# Patient Record
Sex: Male | Born: 1970 | Race: White | Hispanic: No | Marital: Married | Smoking: Never smoker
Health system: Southern US, Community
[De-identification: ages and names within clinical notes are randomized; demographics above are authoritative.]

## PROBLEM LIST (undated history)

## (undated) DIAGNOSIS — Z87442 Personal history of urinary calculi: Secondary | ICD-10-CM

## (undated) DIAGNOSIS — E785 Hyperlipidemia, unspecified: Secondary | ICD-10-CM

## (undated) DIAGNOSIS — T8859XA Other complications of anesthesia, initial encounter: Secondary | ICD-10-CM

## (undated) DIAGNOSIS — N289 Disorder of kidney and ureter, unspecified: Secondary | ICD-10-CM

## (undated) DIAGNOSIS — T7840XA Allergy, unspecified, initial encounter: Secondary | ICD-10-CM

## (undated) DIAGNOSIS — K219 Gastro-esophageal reflux disease without esophagitis: Secondary | ICD-10-CM

## (undated) DIAGNOSIS — E119 Type 2 diabetes mellitus without complications: Secondary | ICD-10-CM

## (undated) DIAGNOSIS — R519 Headache, unspecified: Secondary | ICD-10-CM

## (undated) HISTORY — DX: Hyperlipidemia, unspecified: E78.5

## (undated) HISTORY — PX: OTHER SURGICAL HISTORY: SHX169

## (undated) HISTORY — DX: Type 2 diabetes mellitus without complications: E11.9

## (undated) HISTORY — DX: Personal history of urinary calculi: Z87.442

## (undated) HISTORY — PX: URETHRAL STRICTURE DILATATION: SHX477

## (undated) HISTORY — DX: Allergy, unspecified, initial encounter: T78.40XA

## (undated) HISTORY — PX: WISDOM TOOTH EXTRACTION: SHX21

---
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
## 1994-09-03 HISTORY — PX: COLONOSCOPY: SHX174

## 2012-05-04 ENCOUNTER — Emergency Department: Payer: Self-pay | Admitting: Emergency Medicine

## 2012-05-04 LAB — COMPREHENSIVE METABOLIC PANEL
Albumin: 4.5 g/dL (ref 3.4–5.0)
Alkaline Phosphatase: 129 U/L (ref 50–136)
BUN: 12 mg/dL (ref 7–18)
Bilirubin,Total: 0.4 mg/dL (ref 0.2–1.0)
Calcium, Total: 9.2 mg/dL (ref 8.5–10.1)
Chloride: 99 mmol/L (ref 98–107)
Co2: 29 mmol/L (ref 21–32)
Creatinine: 1.06 mg/dL (ref 0.60–1.30)
EGFR (African American): >60
Osmolality: 274 (ref 275–301)
Potassium: 3.5 mmol/L (ref 3.5–5.1)
Total Protein: 8.5 g/dL — ABNORMAL HIGH (ref 6.4–8.2)

## 2012-05-04 LAB — CBC
MCH: 29.1 pg (ref 26.0–34.0)
MCHC: 33.8 g/dL (ref 32.0–36.0)
MCV: 86 fL (ref 80–100)
Platelet: 261 10*3/uL (ref 150–440)
RBC: 5.63 10*6/uL (ref 4.40–5.90)
RDW: 12.7 % (ref 11.5–14.5)

## 2012-05-04 LAB — URINALYSIS, COMPLETE
Bilirubin,UR: NEGATIVE
Glucose,UR: NEGATIVE mg/dL (ref 0–75)
Ketone: NEGATIVE
Leukocyte Esterase: NEGATIVE
Nitrite: NEGATIVE
Protein: 30
RBC,UR: 42 /HPF (ref 0–5)
Specific Gravity: 1.026 (ref 1.003–1.030)
WBC UR: 2 /HPF (ref 0–5)

## 2012-05-04 LAB — LIPASE, BLOOD: Lipase: 225 U/L (ref 73–393)

## 2012-05-04 IMAGING — CT CT STONE STUDY
1 of 2 series · 15 of 32 positions shown, 19 images · non-contrast
Comparison: None

REASON FOR EXAM: right flank pain
COMMENTS:

PROCEDURE:     CT  - CT ABDOMEN /PELVIS WO (STONE)  - [DATE] [DATE]
RESULT:     Indication: Flank Pain
TECHNIQUE: Multiple axial images from the lung bases to the symphysis pubis
were obtained without oral and without intravenous contrast.

[Series 2: 3mm soft tissue · axial · 0.70mm/px · z∈[-532,-64]mm · 15 of 170 slices shown, 19 images]
[im 7/170  soft-tissue]
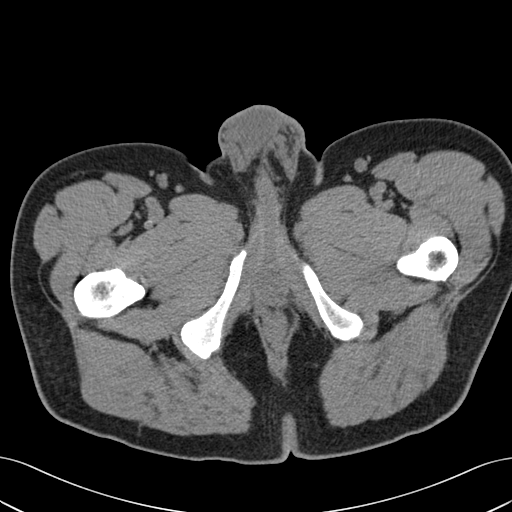
[im 7/170  bone]
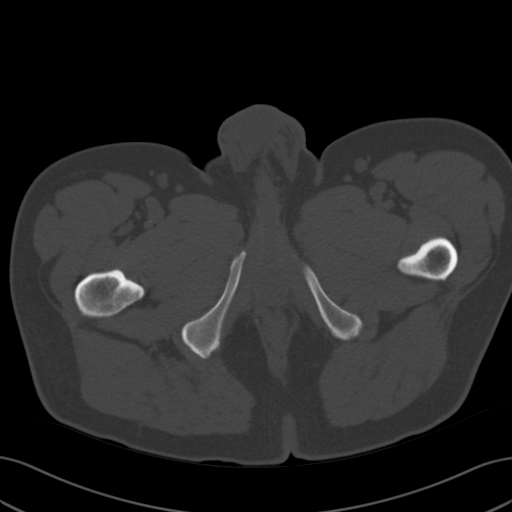
[im 21/170  soft-tissue]
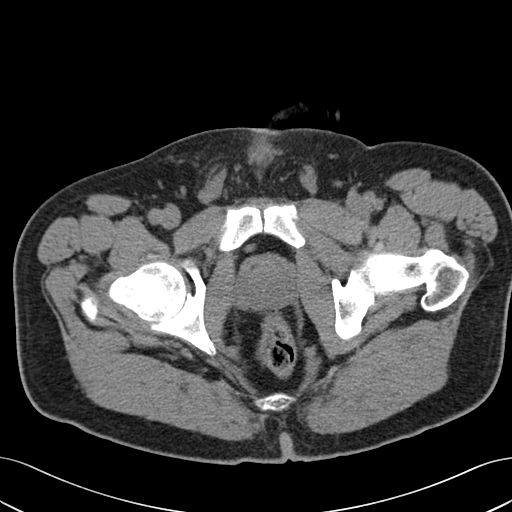
[im 34/170  soft-tissue]
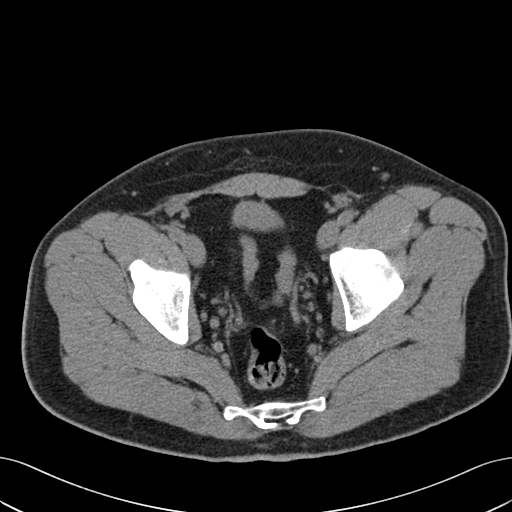
[im 48/170  soft-tissue]
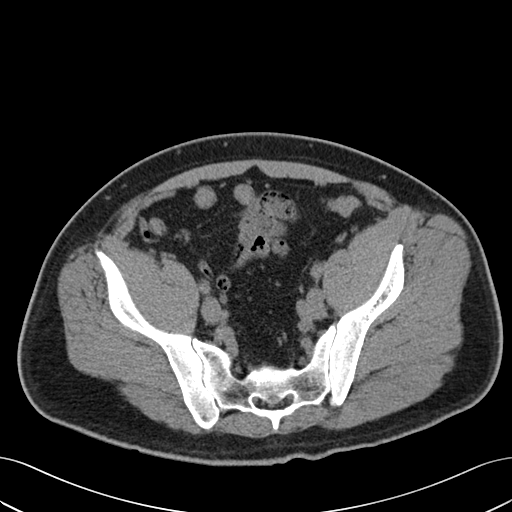
[im 61/170  soft-tissue]
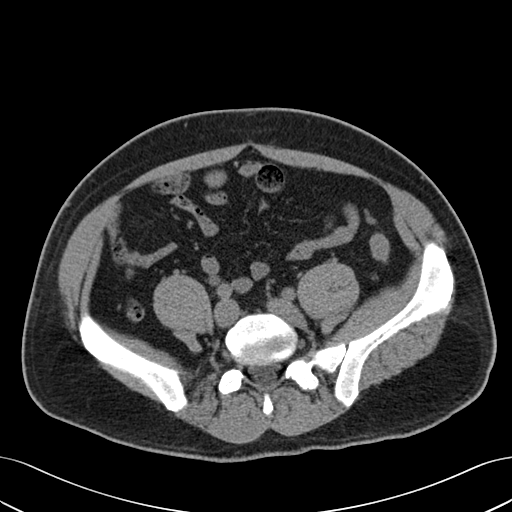
[im 75/170  soft-tissue]
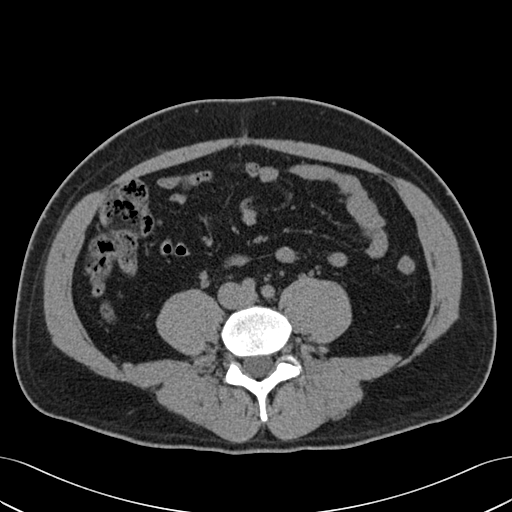
[im 88/170  soft-tissue]
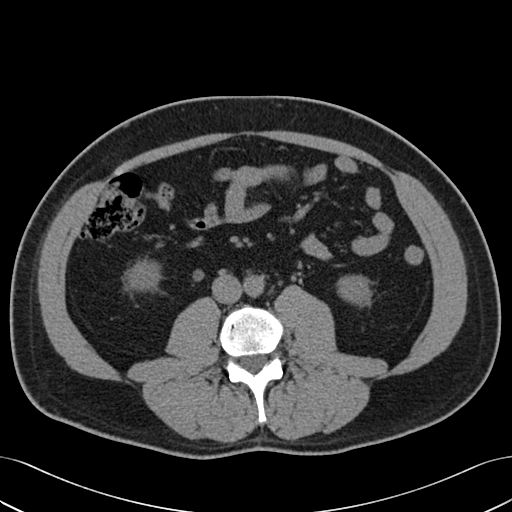
[im 95/170  soft-tissue]
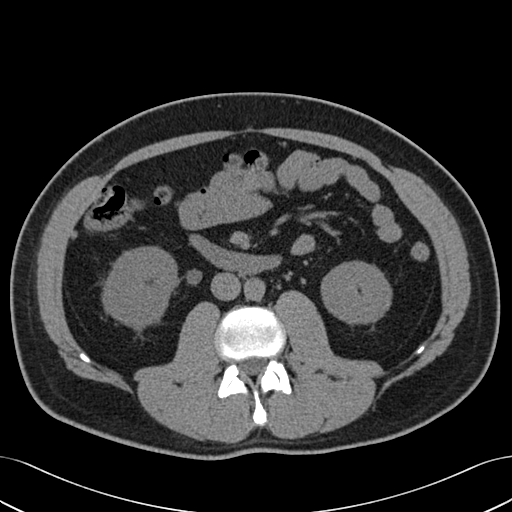
[im 109/170  soft-tissue]
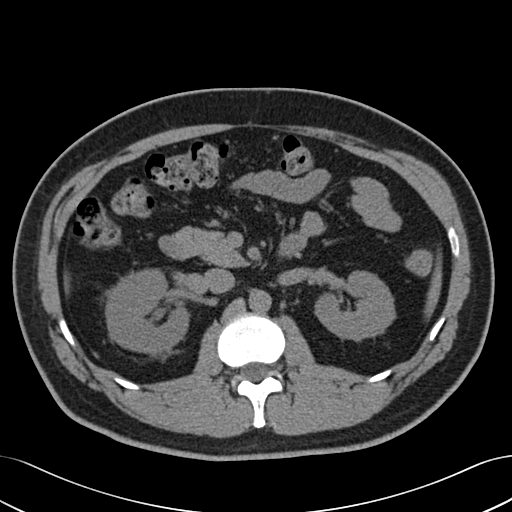
[im 109/170  bone]
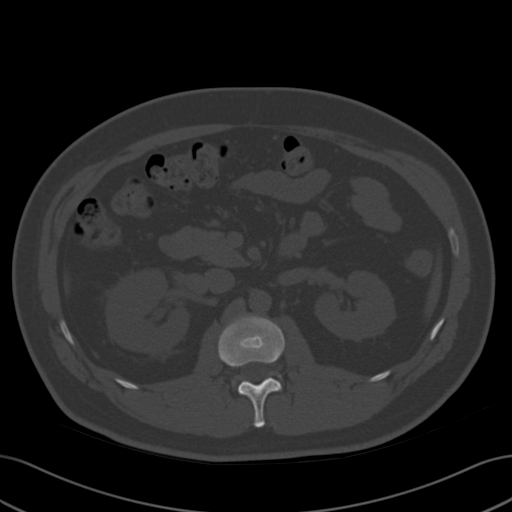
[im 122/170  soft-tissue]
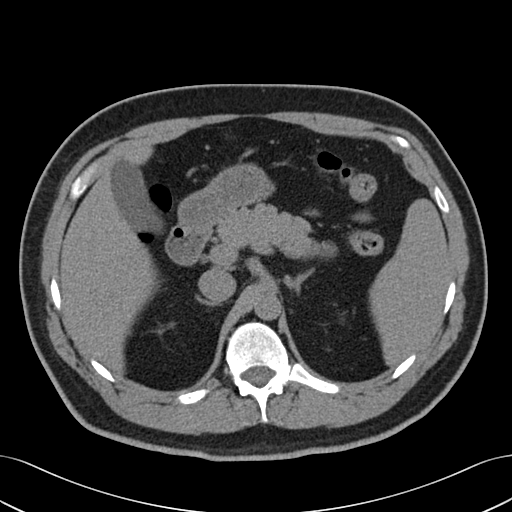
[im 136/170  soft-tissue]
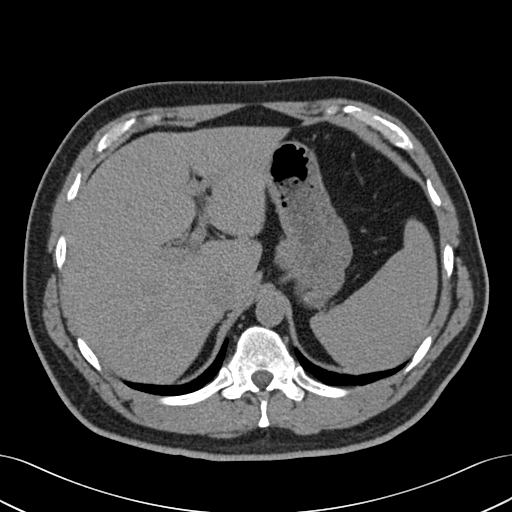
[im 142/170  lung]
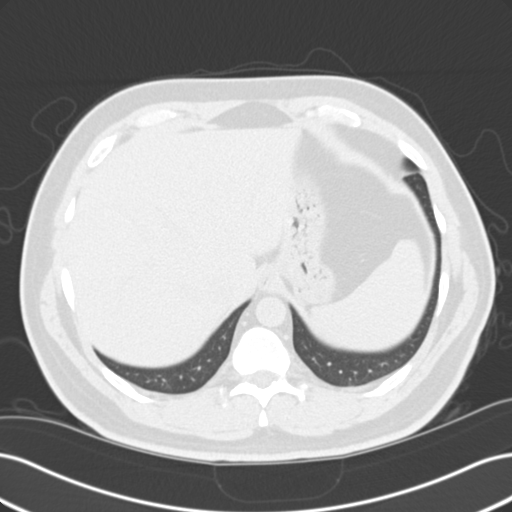
[im 149/170  soft-tissue]
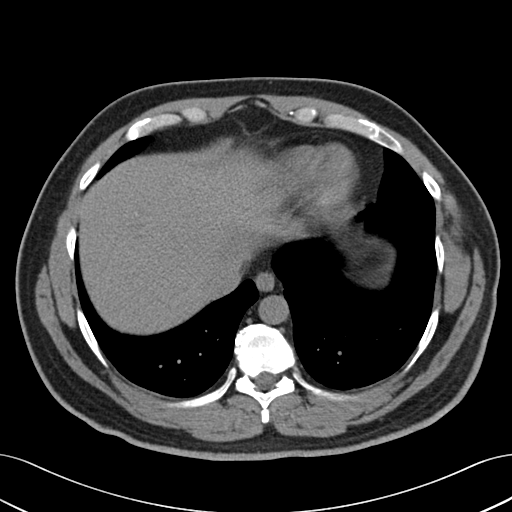
[im 149/170  lung]
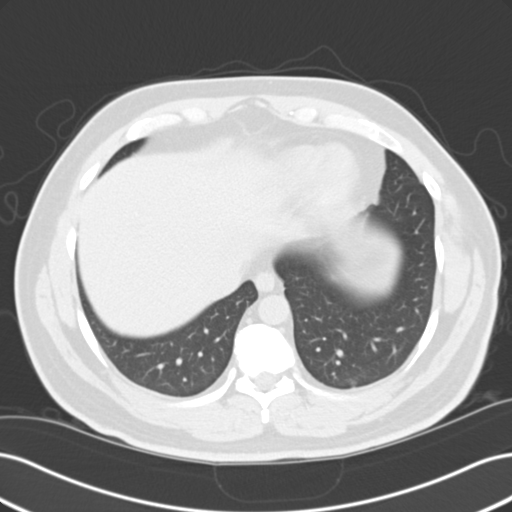
[im 156/170  lung]
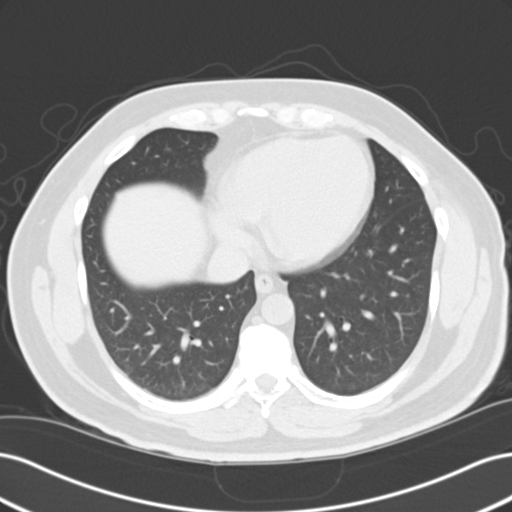
[im 163/170  soft-tissue]
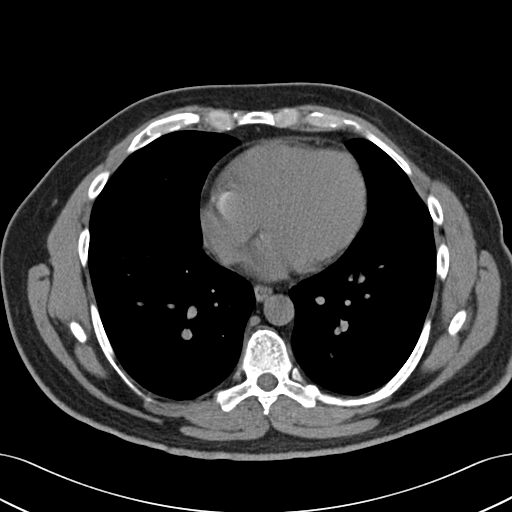
[im 163/170  lung]
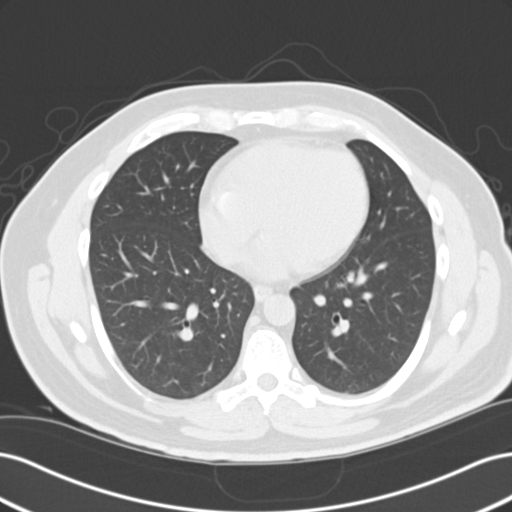

[15 of 32 positions shown; findings below may reference images not displayed]

FINDINGS: The lung bases are clear. There is no pleural or pericardial effusions.

There is a 3 mm distal right ureteral calculus resulting in mild right
hydroureteronephrosis and mild right perinephric stranding. There is no
other renal, ureteral or bladder calculus. The kidneys are symmetric in size
without evidence for exophytic mass. The bladder is unremarkable.

The liver demonstrates no focal abnormality. The gallbladder is
unremarkable. The spleen demonstrates no focal abnormality. The adrenal
glands and pancreas are normal.

The unopacified stomach, duodenum, small intestine, and large intestine are
unremarkable, but evaluation is limited by lack of oral contrast. There is a
normal caliber appendix in the right lower quadrant without periappendiceal
inflammatory changes. There is no pneumoperitoneum, pneumatosis, or portal
venous gas. There is no abdominal or pelvic free fluid. There is no
lymphadenopathy.

The abdominal aorta is normal in caliber.

The osseous structures are unremarkable.
IMPRESSION: 1. There is a 3 mm distal right ureteral calculus resulting in mild right
hydroureteronephrosis and mild right perinephric stranding.

[REDACTED]

## 2012-06-23 HISTORY — PX: SKIN CANCER EXCISION: SHX779

## 2013-06-08 HISTORY — PX: OTHER SURGICAL HISTORY: SHX169

## 2014-06-06 ENCOUNTER — Ambulatory Visit (INDEPENDENT_AMBULATORY_CARE_PROVIDER_SITE_OTHER): Payer: BC Managed Care – PPO | Admitting: Internal Medicine

## 2014-06-06 ENCOUNTER — Encounter: Payer: Self-pay | Admitting: Internal Medicine

## 2014-06-06 VITALS — BP 128/86 | HR 67 | Temp 98.1°F | Ht 68.0 in | Wt 176.0 lb

## 2014-06-06 DIAGNOSIS — R631 Polydipsia: Secondary | ICD-10-CM

## 2014-06-06 DIAGNOSIS — R202 Paresthesia of skin: Secondary | ICD-10-CM

## 2014-06-06 DIAGNOSIS — R739 Hyperglycemia, unspecified: Secondary | ICD-10-CM

## 2014-06-06 DIAGNOSIS — R3589 Other polyuria: Secondary | ICD-10-CM

## 2014-06-06 DIAGNOSIS — R358 Other polyuria: Secondary | ICD-10-CM

## 2014-06-06 DIAGNOSIS — E785 Hyperlipidemia, unspecified: Secondary | ICD-10-CM

## 2014-06-06 LAB — CBC
HCT: 48 % (ref 39.0–52.0)
HEMOGLOBIN: 16.3 g/dL (ref 13.0–17.0)
MCHC: 34 g/dL (ref 30.0–36.0)
MCV: 85.6 fl (ref 78.0–100.0)
Platelets: 264 10*3/uL (ref 150.0–400.0)
RBC: 5.61 Mil/uL (ref 4.22–5.81)
RDW: 12.3 % (ref 11.5–15.5)
WBC: 7.3 10*3/uL (ref 4.0–10.5)

## 2014-06-06 LAB — COMPREHENSIVE METABOLIC PANEL
ALBUMIN: 4.6 g/dL (ref 3.5–5.2)
ALT: 69 U/L — ABNORMAL HIGH (ref 0–53)
AST: 33 U/L (ref 0–37)
Alkaline Phosphatase: 109 U/L (ref 39–117)
BUN: 13 mg/dL (ref 6–23)
CALCIUM: 9.5 mg/dL (ref 8.4–10.5)
CO2: 27 mEq/L (ref 19–32)
CREATININE: 0.9 mg/dL (ref 0.4–1.5)
Chloride: 98 mEq/L (ref 96–112)
GFR: 104.98 mL/min (ref >60.00)
GLUCOSE: 195 mg/dL — AB (ref 70–99)
POTASSIUM: 4.1 meq/L (ref 3.5–5.1)
Sodium: 132 mEq/L — ABNORMAL LOW (ref 135–145)
Total Bilirubin: 0.7 mg/dL (ref 0.2–1.2)
Total Protein: 7.4 g/dL (ref 6.0–8.3)

## 2014-06-06 LAB — LIPID PANEL
CHOLESTEROL: 236 mg/dL — AB (ref 0–200)
HDL: 25.3 mg/dL — AB (ref >39.00)
NonHDL: 210.7
TRIGLYCERIDES: 218 mg/dL — AB (ref 0.0–149.0)
Total CHOL/HDL Ratio: 9
VLDL: 43.6 mg/dL — ABNORMAL HIGH (ref 0.0–40.0)

## 2014-06-06 LAB — HEMOGLOBIN A1C: Hgb A1c MFr Bld: 10.7 % — ABNORMAL HIGH (ref 4.6–6.5)

## 2014-06-06 NOTE — Patient Instructions (Signed)

## 2014-06-06 NOTE — Progress Notes (Signed)
Pre visit review using our clinic review tool, if applicable. No additional management support is needed unless otherwise documented below in the visit note. 

## 2014-06-06 NOTE — Progress Notes (Signed)
Subjective:    Patient ID: Alan Wise, male    DOB: 1970/10/25, 43 y.o.   MRN: [id number]  HPI  Pt presents to the clinic today to follow up hyperglycemia. He reports that he has been seeing urology and was told that he has high amounts of glucose in his urine. He has been checking his blood sugars at home. He reports that they have ranged 160-170 fasting, but has been as high as 300. He has had some increased thirst, increased urination and numbness and tingling in his hands or feet. He has never been told that he has diabetes. He does have a family history of diabetes.  Review of Systems      Past Medical History  Diagnosis Date  . History of kidney stones   . Allergy     No current outpatient prescriptions on file.   No current facility-administered medications for this visit.    No Known Allergies  Family History  Problem Relation Age of Onset  . Hyperlipidemia Father   . Hypertension Father   . Diabetes Father   . Stroke Maternal Grandmother   . Diabetes Maternal Grandmother   . Stroke Paternal Grandmother   . Hypertension Paternal Grandmother     History   Social History  . Marital Status: Single    Spouse Name: N/A    Number of Children: N/A  . Years of Education: N/A   Occupational History  . Not on file.   Social History Main Topics  . Smoking status: Never Smoker   . Smokeless tobacco: Never Used  . Alcohol Use: 0.0 oz/week    0 Not specified per week     Comment: occasional  . Drug Use: Not on file  . Sexual Activity: Not on file   Other Topics Concern  . Not on file   Social History Narrative  . No narrative on file     Constitutional: Denies fever, malaise, fatigue, headache or abrupt weight changes.  HEENT: Denies eye pain, eye redness, ear pain, ringing in the ears, wax buildup, runny nose, nasal congestion, bloody nose, or sore throat. Respiratory: Denies difficulty breathing, shortness of breath, cough or sputum production.    Cardiovascular: Denies chest pain, chest tightness, palpitations or swelling in the hands or feet.  Gastrointestinal: Pt reports increased thirst. Denies abdominal pain, bloating, constipation, diarrhea or blood in the stool.  GU: Pt reports frequency. Denies urgency, pain with urination, burning sensation, blood in urine, odor or discharge. Musculoskeletal: Denies decrease in range of motion, difficulty with gait, muscle pain or joint pain and swelling.  Skin: Denies redness, rashes, lesions or ulcercations.  Neurological: Pt reports numbness and tingling in his feet. Denies dizziness, difficulty with memory, difficulty with speech or problems with balance and coordination.   No other specific complaints in a complete review of systems (except as listed in HPI above).   Objective:   Physical Exam  Ht 5\' 8"  (1.727 m)  Wt 176 lb (79.833 kg)  BMI 26.77 kg/m2 Wt Readings from Last 3 Encounters:  06/06/14 176 lb (79.833 kg)    General: Appears his stated age, well developed, well nourished in NAD. Skin: Warm, dry and intact. No rashes, lesions or ulcerations noted. Cardiovascular: Normal rate and rhythm. S1,S2 noted.  No murmur, rubs or gallops noted. No JVD or BLE edema. No carotid bruits noted. Pulmonary/Chest: Normal effort and positive vesicular breath sounds. No respiratory distress. No wheezes, rales or ronchi noted.  Neurological: Alert and oriented.  Sensation intact to bilateral feet. Coordination normal. +DTRs bilaterally.       Assessment & Plan:   Fasting hyperglycemia, polydipsia, polyuria, paresthesia:  Will check A1C, CBC, CMET and lipid profile today If A1C> 6.5%, he will be diagnosed as diabetic Discussed lifestyle modification versus medication therapy  Will follow up with you after labs are back

## 2014-06-07 LAB — LDL CHOLESTEROL, DIRECT: Direct LDL: 177.2 mg/dL

## 2014-06-12 ENCOUNTER — Ambulatory Visit (INDEPENDENT_AMBULATORY_CARE_PROVIDER_SITE_OTHER): Payer: BC Managed Care – PPO | Admitting: Internal Medicine

## 2014-06-12 ENCOUNTER — Encounter: Payer: Self-pay | Admitting: Internal Medicine

## 2014-06-12 VITALS — BP 118/82 | HR 78 | Temp 98.2°F | Wt 178.0 lb

## 2014-06-12 DIAGNOSIS — E1165 Type 2 diabetes mellitus with hyperglycemia: Secondary | ICD-10-CM | POA: Insufficient documentation

## 2014-06-12 DIAGNOSIS — E785 Hyperlipidemia, unspecified: Secondary | ICD-10-CM | POA: Insufficient documentation

## 2014-06-12 DIAGNOSIS — E1169 Type 2 diabetes mellitus with other specified complication: Secondary | ICD-10-CM | POA: Insufficient documentation

## 2014-06-12 MED ORDER — METFORMIN HCL 1000 MG PO TABS: ORAL_TABLET | ORAL | Status: DC

## 2014-06-12 NOTE — Progress Notes (Signed)
Pre visit review using our clinic review tool, if applicable. No additional management support is needed unless otherwise documented below in the visit note. 

## 2014-06-12 NOTE — Assessment & Plan Note (Signed)
Discussed standards of medical care with diabetes Discussed diabetes and diet Will start Metformin Foot exam today He will make an appt with an eye doctor He declines flu and pneumonia shot today He declines diabetes education classes  He will return in 3 months for follow up with A1C and microalbumin 1 week prior

## 2014-06-12 NOTE — Patient Instructions (Signed)
Diabetes and Standards of Medical Care Diabetes is complicated. You may find that your diabetes team includes a dietitian, nurse, diabetes educator, eye doctor, and more. To help everyone know what is going on and to help you get the care you deserve, the following schedule of care was developed to help keep you on track. Below are the tests, exams, vaccines, medicines, education, and plans you will need. HbA1c test This test shows how well you have controlled your glucose over the past 2-3 months. It is used to see if your diabetes management plan needs to be adjusted.   It is performed at least 2 times a year if you are meeting treatment goals.  It is performed 4 times a year if therapy has changed or if you are not meeting treatment goals. Blood pressure test  This test is performed at every routine medical visit. The goal is less than 140/90 mm Hg for most people, but 130/80 mm Hg in some cases. Ask your health care provider about your goal. Dental exam  Follow up with the dentist regularly. Eye exam  If you are diagnosed with type 1 diabetes as a child, get an exam upon reaching the age of 37 years or older and have had diabetes for 3-5 years. Yearly eye exams are recommended after that initial eye exam.  If you are diagnosed with type 1 diabetes as an adult, get an exam within 5 years of diagnosis and then yearly.  If you are diagnosed with type 2 diabetes, get an exam as soon as possible after the diagnosis and then yearly. Foot care exam  Visual foot exams are performed at every routine medical visit. The exams check for cuts, injuries, or other problems with the feet.  A comprehensive foot exam should be done yearly. This includes visual inspection as well as assessing foot pulses and testing for loss of sensation.  Check your feet nightly for cuts, injuries, or other problems with your feet. Tell your health care provider if anything is not healing. Kidney function test (urine  microalbumin)  This test is performed once a year.  Type 1 diabetes: The first test is performed 5 years after diagnosis.  Type 2 diabetes: The first test is performed at the time of diagnosis.  A serum creatinine and estimated glomerular filtration rate (eGFR) test is done once a year to assess the level of chronic kidney disease (CKD), if present. Lipid profile (cholesterol, HDL, LDL, triglycerides)  Performed every 5 years for most people.  The goal for LDL is less than 100 mg/dL. If you are at high risk, the goal is less than 70 mg/dL.  The goal for HDL is 40 mg/dL-50 mg/dL for men and 50 mg/dL-60 mg/dL for women. An HDL cholesterol of 60 mg/dL or higher gives some protection against heart disease.  The goal for triglycerides is less than 150 mg/dL. Influenza vaccine, pneumococcal vaccine, and hepatitis B vaccine  The influenza vaccine is recommended yearly.  It is recommended that people with diabetes who are over 24 years old get the pneumonia vaccine. In some cases, two separate shots may be given. Ask your health care provider if your pneumonia vaccination is up to date.  The hepatitis B vaccine is also recommended for adults with diabetes. Diabetes self-management education  Education is recommended at diagnosis and ongoing as needed. Treatment plan  Your treatment plan is reviewed at every medical visit. Document Released: 04/06/2009 Document Revised: 10/24/2013 Document Reviewed: 11/09/2012 [facility] Patient Information 2015 [place],  [employer]. This information is not intended to replace advice given to you by your health care provider. Make sure you discuss any questions you have with your health care provider. Diabetes Mellitus and Food It is important for you to manage your blood sugar (glucose) level. Your blood glucose level can be greatly affected by what you eat. Eating healthier foods in the appropriate amounts throughout the day at about the same time each day will  help you control your blood glucose level. It can also help slow or prevent worsening of your diabetes mellitus. Healthy eating may even help you improve the level of your blood pressure and reach or maintain a healthy weight.  HOW CAN FOOD AFFECT ME? Carbohydrates Carbohydrates affect your blood glucose level more than any other type of food. Your dietitian will help you determine how many carbohydrates to eat at each meal and teach you how to count carbohydrates. Counting carbohydrates is important to keep your blood glucose at a healthy level, especially if you are using insulin or taking certain medicines for diabetes mellitus. Alcohol Alcohol can cause sudden decreases in blood glucose (hypoglycemia), especially if you use insulin or take certain medicines for diabetes mellitus. Hypoglycemia can be a life-threatening condition. Symptoms of hypoglycemia (sleepiness, dizziness, and disorientation) are similar to symptoms of having too much alcohol.  If your health care provider has given you approval to drink alcohol, do so in moderation and use the following guidelines:  Women should not have more than one drink per day, and men should not have more than two drinks per day. One drink is equal to:  12 oz of beer.  5 oz of wine.  1 oz of hard liquor.  Do not drink on an empty stomach.  Keep yourself hydrated. Have water, diet soda, or unsweetened iced tea.  Regular soda, juice, and other mixers might contain a lot of carbohydrates and should be counted. WHAT FOODS ARE NOT RECOMMENDED? As you make food choices, it is important to remember that all foods are not the same. Some foods have fewer nutrients per serving than other foods, even though they might have the same number of calories or carbohydrates. It is difficult to get your body what it needs when you eat foods with fewer nutrients. Examples of foods that you should avoid that are high in calories and carbohydrates but low in  nutrients include:  Trans fats (most processed foods list trans fats on the Nutrition Facts label).  Regular soda.  Juice.  Candy.  Sweets, such as cake, pie, doughnuts, and cookies.  Fried foods. WHAT FOODS CAN I EAT? Have nutrient-rich foods, which will nourish your body and keep you healthy. The food you should eat also will depend on several factors, including:  The calories you need.  The medicines you take.  Your weight.  Your blood glucose level.  Your blood pressure level.  Your cholesterol level. You also should eat a variety of foods, including:  Protein, such as meat, poultry, fish, tofu, nuts, and seeds (lean animal proteins are best).  Fruits.  Vegetables.  Dairy products, such as milk, cheese, and yogurt (low fat is best).  Breads, grains, pasta, cereal, rice, and beans.  Fats such as olive oil, trans fat-free margarine, canola oil, avocado, and olives. DOES EVERYONE WITH DIABETES MELLITUS HAVE THE SAME MEAL PLAN? Because every person with diabetes mellitus is different, there is not one meal plan that works for everyone. It is very important that you meet with a  dietitian who will help you create a meal plan that is just right for you. Document Released: 03/06/2005 Document Revised: 06/14/2013 Document Reviewed: 05/06/2013 [facility] Patient Information 2015 [place], [place]. This information is not intended to replace advice given to you by your health care provider. Make sure you discuss any questions you have with your health care provider.

## 2014-06-12 NOTE — Progress Notes (Signed)
Subjective:    Patient ID: Alan Wise, male    DOB: [birth year], 43 y.o.   MRN: [id number]  HPI  Pt presents to the clinic today to follow up labs. His A1C was 10.1%. His cholesterol is also elevated. Total 236, HDL 25, LDL 177 and Triglycerides 218.  Review of Systems      Past Medical History  Diagnosis Date  . History of kidney stones   . Allergy     No current outpatient prescriptions on file.   No current facility-administered medications for this visit.    No Known Allergies  Family History  Problem Relation Age of Onset  . Hyperlipidemia Father   . Hypertension Father   . Diabetes Father   . Stroke Maternal Grandmother   . Diabetes Maternal Grandmother   . Stroke Paternal Grandmother   . Hypertension Paternal Grandmother     History   Social History  . Marital Status: Single    Spouse Name: N/A    Number of Children: N/A  . Years of Education: N/A   Occupational History  . Not on file.   Social History Main Topics  . Smoking status: Never Smoker   . Smokeless tobacco: Never Used  . Alcohol Use: 0.0 oz/week    0 Not specified per week     Comment: occasional  . Drug Use: Not on file  . Sexual Activity: Not on file   Other Topics Concern  . Not on file   Social History Narrative     Constitutional: Denies fever, malaise, fatigue, headache or abrupt weight changes.  HEENT: Denies eye pain, eye redness, ear pain, ringing in the ears, wax buildup, runny nose, nasal congestion, bloody nose, or sore throat. Respiratory: Denies difficulty breathing, shortness of breath, cough or sputum production.   Cardiovascular: Denies chest pain, chest tightness, palpitations or swelling in the hands or feet.  Gastrointestinal: Pt reports increased thirst. Denies abdominal pain, bloating, constipation, diarrhea or blood in the stool.  GU: Pt reports polyuria. Denies urgency, frequency, pain with urination, burning sensation, blood in urine, odor or  discharge. Musculoskeletal: Denies decrease in range of motion, difficulty with gait, muscle pain or joint pain and swelling.  Skin: Denies redness, rashes, lesions or ulcercations.  Neurological: Denies dizziness, difficulty with memory, difficulty with speech or problems with balance and coordination.   No other specific complaints in a complete review of systems (except as listed in HPI above).  Objective:   Physical Exam   BP 118/82 mmHg  Pulse 78  Temp(Src) 98.2 F (36.8 C) (Oral)  Wt 178 lb (80.74 kg)  SpO2 98% Wt Readings from Last 3 Encounters:  06/12/14 178 lb (80.74 kg)  06/06/14 176 lb (79.833 kg)    General: Appears  his stated age, well developed, well nourished in NAD. Skin: Warm, dry and intact. No rashes, lesions or ulcerations noted. HEENT: Head: normal shape and size; Eyes: sclera white, no icterus, conjunctiva pink, PERRLA and EOMs intact;  Cardiovascular: Normal rate and rhythm. S1,S2 noted.  No murmur, rubs or gallops noted. No JVD or BLE edema. No carotid bruits noted. Pulmonary/Chest: Normal effort and positive vesicular breath sounds. No respiratory distress. No wheezes, rales or ronchi noted.  Abdomen: Soft and nontender. Normal bowel sounds, no bruits noted. No distention or masses noted.  Neurological: Alert and oriented. Sensation intact to 10 gm monofilament.  BMET    Component Value Date/Time   NA 132* 06/06/2014 1406   K 4.1 06/06/2014 1406  CL 98 06/06/2014 1406   CO2 27 06/06/2014 1406   GLUCOSE 195* 06/06/2014 1406   BUN 13 06/06/2014 1406   CREATININE 0.9 06/06/2014 1406   CALCIUM 9.5 06/06/2014 1406    Lipid Panel     Component Value Date/Time   CHOL 236* 06/06/2014 1406   TRIG 218.0* 06/06/2014 1406   HDL 25.30* 06/06/2014 1406   CHOLHDL 9 06/06/2014 1406   VLDL 43.6* 06/06/2014 1406    CBC    Component Value Date/Time   WBC 7.3 06/06/2014 1406   RBC 5.61 06/06/2014 1406   HGB 16.3 06/06/2014 1406   HCT 48.0 06/06/2014  1406   PLT 264.0 06/06/2014 1406   MCV 85.6 06/06/2014 1406   MCHC 34.0 06/06/2014 1406   RDW 12.3 06/06/2014 1406    Hgb A1C Lab Results  Component Value Date   HGBA1C 10.7* 06/06/2014        Assessment & Plan:

## 2014-06-12 NOTE — Assessment & Plan Note (Signed)
He will not start a statin today Encouraged him to consume a low fat diet Advised him to take fish oil OTC daily  He will return to the clinic in 3 months to follow up with lipid profile and CMET 1 week prior

## 2014-08-01 ENCOUNTER — Ambulatory Visit: Payer: Self-pay | Admitting: Internal Medicine

## 2014-08-10 ENCOUNTER — Other Ambulatory Visit (INDEPENDENT_AMBULATORY_CARE_PROVIDER_SITE_OTHER): Payer: BC Managed Care – PPO

## 2014-08-10 DIAGNOSIS — E785 Hyperlipidemia, unspecified: Secondary | ICD-10-CM

## 2014-08-10 DIAGNOSIS — E1165 Type 2 diabetes mellitus with hyperglycemia: Secondary | ICD-10-CM

## 2014-08-10 LAB — LIPID PANEL
Cholesterol: 240 mg/dL — ABNORMAL HIGH (ref 0–200)
HDL: 30.4 mg/dL — AB (ref >39.00)
NonHDL: 209.6
TRIGLYCERIDES: 207 mg/dL — AB (ref 0.0–149.0)
Total CHOL/HDL Ratio: 8
VLDL: 41.4 mg/dL — AB (ref 0.0–40.0)

## 2014-08-10 LAB — MICROALBUMIN / CREATININE URINE RATIO
Creatinine,U: 254.8 mg/dL
Microalb Creat Ratio: 0.8 mg/g (ref 0.0–30.0)
Microalb, Ur: 2.1 mg/dL — ABNORMAL HIGH (ref 0.0–1.9)

## 2014-08-10 LAB — COMPREHENSIVE METABOLIC PANEL
ALT: 54 U/L — AB (ref 0–53)
AST: 29 U/L (ref 0–37)
Albumin: 4.5 g/dL (ref 3.5–5.2)
Alkaline Phosphatase: 108 U/L (ref 39–117)
BILIRUBIN TOTAL: 0.5 mg/dL (ref 0.2–1.2)
BUN: 15 mg/dL (ref 6–23)
CHLORIDE: 100 meq/L (ref 96–112)
CO2: 32 mEq/L (ref 19–32)
Calcium: 9.8 mg/dL (ref 8.4–10.5)
Creatinine, Ser: 1.02 mg/dL (ref 0.40–1.50)
GFR: 84.99 mL/min (ref >60.00)
Glucose, Bld: 138 mg/dL — ABNORMAL HIGH (ref 70–99)
Potassium: 4.2 mEq/L (ref 3.5–5.1)
SODIUM: 136 meq/L (ref 135–145)
Total Protein: 7.5 g/dL (ref 6.0–8.3)

## 2014-08-10 LAB — LDL CHOLESTEROL, DIRECT: LDL DIRECT: 180 mg/dL

## 2014-08-10 LAB — HEMOGLOBIN A1C: Hgb A1c MFr Bld: 8.1 % — ABNORMAL HIGH (ref 4.6–6.5)

## 2014-08-17 ENCOUNTER — Encounter: Payer: Self-pay | Admitting: Internal Medicine

## 2014-08-17 ENCOUNTER — Ambulatory Visit (INDEPENDENT_AMBULATORY_CARE_PROVIDER_SITE_OTHER): Payer: BC Managed Care – PPO | Admitting: Internal Medicine

## 2014-08-17 VITALS — BP 120/78 | HR 74 | Temp 98.3°F | Wt 175.0 lb

## 2014-08-17 DIAGNOSIS — N359 Urethral stricture, unspecified: Secondary | ICD-10-CM

## 2014-08-17 DIAGNOSIS — E1165 Type 2 diabetes mellitus with hyperglycemia: Secondary | ICD-10-CM

## 2014-08-17 DIAGNOSIS — E785 Hyperlipidemia, unspecified: Secondary | ICD-10-CM

## 2014-08-17 DIAGNOSIS — N35919 Unspecified urethral stricture, male, unspecified site: Secondary | ICD-10-CM

## 2014-08-17 NOTE — Patient Instructions (Signed)
Fat and Cholesterol Control Diet Fat and cholesterol levels in your blood and organs are influenced by your diet. High levels of fat and cholesterol may lead to diseases of the heart, small and large blood vessels, gallbladder, liver, and pancreas. CONTROLLING FAT AND CHOLESTEROL WITH DIET Although exercise and lifestyle factors are important, your diet is key. That is because certain foods are known to raise cholesterol and others to lower it. The goal is to balance foods for their effect on cholesterol and more importantly, to replace saturated and trans fat with other types of fat, such as monounsaturated fat, polyunsaturated fat, and omega-3 fatty acids. On average, a person should consume no more than 15 to 17 g of saturated fat daily. Saturated and trans fats are considered "bad" fats, and they will raise LDL cholesterol. Saturated fats are primarily found in animal products such as meats, butter, and cream. However, that does not mean you need to give up all your favorite foods. Today, there are good tasting, low-fat, low-cholesterol substitutes for most of the things you like to eat. Choose low-fat or nonfat alternatives. Choose round or loin cuts of red meat. These types of cuts are lowest in fat and cholesterol. Chicken (without the skin), fish, veal, and ground turkey breast are great choices. Eliminate fatty meats, such as hot dogs and salami. Even shellfish have little or no saturated fat. Have a 3 oz (85 g) portion when you eat lean meat, poultry, or fish. Trans fats are also called "partially hydrogenated oils." They are oils that have been scientifically manipulated so that they are solid at room temperature resulting in a longer shelf life and improved taste and texture of foods in which they are added. Trans fats are found in stick margarine, some tub margarines, cookies, crackers, and baked goods.  When baking and cooking, oils are a great substitute for butter. The monounsaturated oils are  especially beneficial since it is believed they lower LDL and raise HDL. The oils you should avoid entirely are saturated tropical oils, such as coconut and palm.  Remember to eat a lot from food groups that are naturally free of saturated and trans fat, including fish, fruit, vegetables, beans, grains (barley, rice, couscous, bulgur wheat), and pasta (without cream sauces).  IDENTIFYING FOODS THAT LOWER FAT AND CHOLESTEROL  Soluble fiber may lower your cholesterol. This type of fiber is found in fruits such as apples, vegetables such as broccoli, potatoes, and carrots, legumes such as beans, peas, and lentils, and grains such as barley. Foods fortified with plant sterols (phytosterol) may also lower cholesterol. You should eat at least 2 g per day of these foods for a cholesterol lowering effect.  Read package labels to identify low-saturated fats, trans fat free, and low-fat foods at the supermarket. Select cheeses that have only 2 to 3 g saturated fat per ounce. Use a heart-healthy tub margarine that is free of trans fats or partially hydrogenated oil. When buying baked goods (cookies, crackers), avoid partially hydrogenated oils. Breads and muffins should be made from whole grains (whole-wheat or whole oat flour, instead of "flour" or "enriched flour"). Buy non-creamy canned soups with reduced salt and no added fats.  FOOD PREPARATION TECHNIQUES  Never deep-fry. If you must fry, either stir-fry, which uses very little fat, or use non-stick cooking sprays. When possible, broil, bake, or roast meats, and steam vegetables. Instead of putting butter or margarine on vegetables, use lemon and herbs, applesauce, and cinnamon (for squash and sweet potatoes). Use nonfat   yogurt, salsa, and low-fat dressings for salads.  LOW-SATURATED FAT / LOW-FAT FOOD SUBSTITUTES Meats / Saturated Fat (g)  Avoid: Steak, marbled (3 oz/85 g) / 11 g  Choose: Steak, lean (3 oz/85 g) / 4 g  Avoid: Hamburger (3 oz/85 g) / 7  g  Choose: Hamburger, lean (3 oz/85 g) / 5 g  Avoid: Ham (3 oz/85 g) / 6 g  Choose: Ham, lean cut (3 oz/85 g) / 2.4 g  Avoid: Chicken, with skin, dark meat (3 oz/85 g) / 4 g  Choose: Chicken, skin removed, dark meat (3 oz/85 g) / 2 g  Avoid: Chicken, with skin, light meat (3 oz/85 g) / 2.5 g  Choose: Chicken, skin removed, light meat (3 oz/85 g) / 1 g Dairy / Saturated Fat (g)  Avoid: Whole milk (1 cup) / 5 g  Choose: Low-fat milk, 2% (1 cup) / 3 g  Choose: Low-fat milk, 1% (1 cup) / 1.5 g  Choose: Skim milk (1 cup) / 0.3 g  Avoid: Hard cheese (1 oz/28 g) / 6 g  Choose: Skim milk cheese (1 oz/28 g) / 2 to 3 g  Avoid: Cottage cheese, 4% fat (1 cup) / 6.5 g  Choose: Low-fat cottage cheese, 1% fat (1 cup) / 1.5 g  Avoid: Ice cream (1 cup) / 9 g  Choose: Sherbet (1 cup) / 2.5 g  Choose: Nonfat frozen yogurt (1 cup) / 0.3 g  Choose: Frozen fruit bar / trace  Avoid: Whipped cream (1 tbs) / 3.5 g  Choose: Nondairy whipped topping (1 tbs) / 1 g Condiments / Saturated Fat (g)  Avoid: Mayonnaise (1 tbs) / 2 g  Choose: Low-fat mayonnaise (1 tbs) / 1 g  Avoid: Butter (1 tbs) / 7 g  Choose: Extra light margarine (1 tbs) / 1 g  Avoid: Coconut oil (1 tbs) / 11.8 g  Choose: Olive oil (1 tbs) / 1.8 g  Choose: Corn oil (1 tbs) / 1.7 g  Choose: Safflower oil (1 tbs) / 1.2 g  Choose: Sunflower oil (1 tbs) / 1.4 g  Choose: Soybean oil (1 tbs) / 2.4 g  Choose: Canola oil (1 tbs) / 1 g Document Released: 06/09/2005 Document Revised: 10/04/2012 Document Reviewed: 09/07/2013 [employer] Patient Information 2015 [employer]. This information is not intended to replace advice given to you by your health care provider. Make sure you discuss any questions you have with your health care provider.  

## 2014-08-17 NOTE — Progress Notes (Signed)
Subjective:    Patient ID: Alan Wise, male    DOB: 05/02/1971, 44 y.o.   MRN: [id number]  HPI  Pt presents to the clinic today to establish care and for management of the conditions listed below.  HLD: LDL 180 (08/10/14). He does not adhere to a low fat diet. He has never been told he has high cholesterol in the past.  DM 2: Diagnosed 05/2014. A1C 8.1% (08/10/14). He is on Metformin. He reports that he only takes Metformin if his fasting sugar is > 140. If it is < 140, he will only take it 1 time per day and sometimes not at all. He reports he has had some hypoglycemic readings in the evenings but never fasting. The hypoglycemia occurs after he goes dancing in the evening without eat prior. He has experienced some diarrhea with the Metformin. His flu and pneumonia shot are not UTD. His weight has been stable.  Additionally, he reports he wants a referral for a urologist. He sees a urologist for a urethral stricture. He has had it surgically repaired but it seems to reform scar tissue and cause problems. His urologist in [facility] is retiring.  Review of Systems      Past Medical History  Diagnosis Date  . History of kidney stones   . Allergy     Current Outpatient Prescriptions  Medication Sig Dispense Refill  . metFORMIN (GLUCOPHAGE) 1000 MG tablet Take 1/2 tablet Twice daily with meal x 2 weeks, then increase to 1 tablet BID with meals thereafter 60 tablet 2   No current facility-administered medications for this visit.    No Known Allergies  Family History  Problem Relation Age of Onset  . Hyperlipidemia Father   . Hypertension Father   . Diabetes Father   . Stroke Maternal Grandmother   . Diabetes Maternal Grandmother   . Stroke Paternal Grandmother   . Hypertension Paternal Grandmother     History   Social History  . Marital Status: Single    Spouse Name: N/A  . Number of Children: N/A  . Years of Education: N/A   Occupational History  . Not on  file.   Social History Main Topics  . Smoking status: Never Smoker   . Smokeless tobacco: Never Used  . Alcohol Use: 0.0 oz/week    0 Standard drinks or equivalent per week     Comment: occasional  . Drug Use: Not on file  . Sexual Activity: Not on file   Other Topics Concern  . Not on file   Social History Narrative     Constitutional: Denies fever, malaise, fatigue, headache or abrupt weight changes.  Respiratory: Denies difficulty breathing, shortness of breath, cough or sputum production.   Cardiovascular: Denies chest pain, chest tightness, palpitations or swelling in the hands or feet.  Gastrointestinal: Pt reports diarrhea. Denies abdominal pain, bloating, constipation, or blood in the stool.  GU: Denies urgency, frequency, pain with urination, burning sensation, blood in urine, odor or discharge. Skin: Denies redness, rashes, lesions or ulcercations.  Neurological: Denies dizziness, difficulty with memory, difficulty with speech or problems with balance and coordination.   No other specific complaints in a complete review of systems (except as listed in HPI above).  Objective:   Physical Exam  BP 120/78 mmHg  Pulse 74  Temp(Src) 98.3 F (36.8 C) (Oral)  Wt 175 lb (79.379 kg)  SpO2 98% Wt Readings from Last 3 Encounters:  08/17/14 175 lb (79.379 kg)  06/12/14  178 lb (80.74 kg)  06/06/14 176 lb (79.833 kg)    General: Appears his stated age, well developed, well nourished in NAD. Skin: Warm, dry and intact. No rashes, lesions or ulcerations noted. Cardiovascular: Normal rate and rhythm. S1,S2 noted.  No murmur, rubs or gallops noted.  Pulmonary/Chest: Normal effort and positive vesicular breath sounds. No respiratory distress. No wheezes, rales or ronchi noted.  Abdomen: Soft and nontender. Normal bowel sounds, no bruits noted. No distention or masses noted. Liver, spleen and kidneys non palpable. Neurological: Alert and oriented.   BMET    Component Value  Date/Time   NA 136 08/10/2014 1116   K 4.2 08/10/2014 1116   CL 100 08/10/2014 1116   CO2 32 08/10/2014 1116   GLUCOSE 138* 08/10/2014 1116   BUN 15 08/10/2014 1116   CREATININE 1.02 08/10/2014 1116   CALCIUM 9.8 08/10/2014 1116    Lipid Panel     Component Value Date/Time   CHOL 240* 08/10/2014 1116   TRIG 207.0* 08/10/2014 1116   HDL 30.40* 08/10/2014 1116   CHOLHDL 8 08/10/2014 1116   VLDL 41.4* 08/10/2014 1116    CBC    Component Value Date/Time   WBC 7.3 06/06/2014 1406   RBC 5.61 06/06/2014 1406   HGB 16.3 06/06/2014 1406   HCT 48.0 06/06/2014 1406   PLT 264.0 06/06/2014 1406   MCV 85.6 06/06/2014 1406   MCHC 34.0 06/06/2014 1406   RDW 12.3 06/06/2014 1406    Hgb A1C Lab Results  Component Value Date   HGBA1C 8.1* 08/10/2014         Assessment & Plan:

## 2014-08-17 NOTE — Assessment & Plan Note (Signed)
Referral placed for urology

## 2014-08-17 NOTE — Progress Notes (Signed)
Pre visit review using our clinic review tool, if applicable. No additional management support is needed unless otherwise documented below in the visit note. 

## 2014-08-17 NOTE — Assessment & Plan Note (Signed)
Improved Having diarrhea with Metformin Discussed swithcihg to Glipizide but he just had his metformin refilled He declines flu and pneumonia vaccine Gave him the contact info for [facility] so that he can make appt for his dilated eye exam  RTC in 3 months with labs 1 week prior

## 2014-08-17 NOTE — Assessment & Plan Note (Signed)
LDL goal < 100 He refuses statin therapy at this time Advised him to consume a low fat, low cholesterol diet- handout provided Start fish oil 1000 mg BID  Will repeat lipids in 3 months

## 2014-11-13 ENCOUNTER — Other Ambulatory Visit: Payer: Self-pay | Admitting: Internal Medicine

## 2014-11-13 DIAGNOSIS — E785 Hyperlipidemia, unspecified: Secondary | ICD-10-CM

## 2014-11-13 DIAGNOSIS — E119 Type 2 diabetes mellitus without complications: Secondary | ICD-10-CM

## 2014-11-14 ENCOUNTER — Other Ambulatory Visit (INDEPENDENT_AMBULATORY_CARE_PROVIDER_SITE_OTHER): Payer: BC Managed Care – PPO

## 2014-11-14 DIAGNOSIS — E785 Hyperlipidemia, unspecified: Secondary | ICD-10-CM | POA: Diagnosis not present

## 2014-11-14 DIAGNOSIS — E119 Type 2 diabetes mellitus without complications: Secondary | ICD-10-CM

## 2014-11-14 LAB — COMPREHENSIVE METABOLIC PANEL
ALT: 40 U/L (ref 0–53)
AST: 20 U/L (ref 0–37)
Albumin: 4.5 g/dL (ref 3.5–5.2)
Alkaline Phosphatase: 102 U/L (ref 39–117)
BUN: 14 mg/dL (ref 6–23)
CALCIUM: 9.4 mg/dL (ref 8.4–10.5)
CO2: 30 meq/L (ref 19–32)
CREATININE: 0.84 mg/dL (ref 0.40–1.50)
Chloride: 99 mEq/L (ref 96–112)
GFR: 106.2 mL/min (ref >60.00)
Glucose, Bld: 167 mg/dL — ABNORMAL HIGH (ref 70–99)
Potassium: 4.3 mEq/L (ref 3.5–5.1)
Sodium: 135 mEq/L (ref 135–145)
TOTAL PROTEIN: 7.1 g/dL (ref 6.0–8.3)
Total Bilirubin: 0.4 mg/dL (ref 0.2–1.2)

## 2014-11-14 LAB — LIPID PANEL
CHOLESTEROL: 207 mg/dL — AB (ref 0–200)
HDL: 26.8 mg/dL — AB (ref >39.00)
NONHDL: 180.2
TRIGLYCERIDES: 225 mg/dL — AB (ref 0.0–149.0)
Total CHOL/HDL Ratio: 8
VLDL: 45 mg/dL — AB (ref 0.0–40.0)

## 2014-11-14 LAB — CBC
HCT: 44.7 % (ref 39.0–52.0)
Hemoglobin: 15.4 g/dL (ref 13.0–17.0)
MCHC: 34.5 g/dL (ref 30.0–36.0)
MCV: 85.2 fl (ref 78.0–100.0)
Platelets: 268 10*3/uL (ref 150.0–400.0)
RBC: 5.24 Mil/uL (ref 4.22–5.81)
RDW: 12.2 % (ref 11.5–15.5)
WBC: 8.1 10*3/uL (ref 4.0–10.5)

## 2014-11-14 LAB — HEMOGLOBIN A1C: Hgb A1c MFr Bld: 7.6 % — ABNORMAL HIGH (ref 4.6–6.5)

## 2014-11-14 LAB — LDL CHOLESTEROL, DIRECT: Direct LDL: 156 mg/dL

## 2014-11-16 ENCOUNTER — Encounter: Payer: Self-pay | Admitting: Internal Medicine

## 2014-11-16 ENCOUNTER — Ambulatory Visit (INDEPENDENT_AMBULATORY_CARE_PROVIDER_SITE_OTHER): Payer: BC Managed Care – PPO | Admitting: Internal Medicine

## 2014-11-16 VITALS — BP 122/84 | HR 70 | Temp 98.0°F | Wt 179.0 lb

## 2014-11-16 DIAGNOSIS — E785 Hyperlipidemia, unspecified: Secondary | ICD-10-CM

## 2014-11-16 DIAGNOSIS — E1165 Type 2 diabetes mellitus with hyperglycemia: Secondary | ICD-10-CM

## 2014-11-16 MED ORDER — GLUCOSE BLOOD VI STRP: 1.0000 | ORAL_STRIP | Freq: Two times a day (BID) | Status: DC | PRN

## 2014-11-16 MED ORDER — METFORMIN HCL 1000 MG PO TABS: 1000.0000 mg | ORAL_TABLET | Freq: Every day | ORAL | Status: DC

## 2014-11-16 NOTE — Addendum Note (Signed)
Addended by: Lurlean Nanny on: 11/16/2014 10:25 AM   Modules accepted: Orders

## 2014-11-16 NOTE — Progress Notes (Signed)
Pre visit review using our clinic review tool, if applicable. No additional management support is needed unless otherwise documented below in the visit note. 

## 2014-11-16 NOTE — Patient Instructions (Signed)
Fat and Cholesterol Control Diet Fat and cholesterol levels in your blood and organs are influenced by your diet. High levels of fat and cholesterol may lead to diseases of the heart, small and large blood vessels, gallbladder, liver, and pancreas. CONTROLLING FAT AND CHOLESTEROL WITH DIET Although exercise and lifestyle factors are important, your diet is key. That is because certain foods are known to raise cholesterol and others to lower it. The goal is to balance foods for their effect on cholesterol and more importantly, to replace saturated and trans fat with other types of fat, such as monounsaturated fat, polyunsaturated fat, and omega-3 fatty acids. On average, a person should consume no more than 15 to 17 g of saturated fat daily. Saturated and trans fats are considered "bad" fats, and they will raise LDL cholesterol. Saturated fats are primarily found in animal products such as meats, butter, and cream. However, that does not mean you need to give up all your favorite foods. Today, there are good tasting, low-fat, low-cholesterol substitutes for most of the things you like to eat. Choose low-fat or nonfat alternatives. Choose round or loin cuts of red meat. These types of cuts are lowest in fat and cholesterol. Chicken (without the skin), fish, veal, and ground turkey breast are great choices. Eliminate fatty meats, such as hot dogs and salami. Even shellfish have little or no saturated fat. Have a 3 oz (85 g) portion when you eat lean meat, poultry, or fish. Trans fats are also called "partially hydrogenated oils." They are oils that have been scientifically manipulated so that they are solid at room temperature resulting in a longer shelf life and improved taste and texture of foods in which they are added. Trans fats are found in stick margarine, some tub margarines, cookies, crackers, and baked goods.  When baking and cooking, oils are a great substitute for butter. The monounsaturated oils are  especially beneficial since it is believed they lower LDL and raise HDL. The oils you should avoid entirely are saturated tropical oils, such as coconut and palm.  Remember to eat a lot from food groups that are naturally free of saturated and trans fat, including fish, fruit, vegetables, beans, grains (barley, rice, couscous, bulgur wheat), and pasta (without cream sauces).  IDENTIFYING FOODS THAT LOWER FAT AND CHOLESTEROL  Soluble fiber may lower your cholesterol. This type of fiber is found in fruits such as apples, vegetables such as broccoli, potatoes, and carrots, legumes such as beans, peas, and lentils, and grains such as barley. Foods fortified with plant sterols (phytosterol) may also lower cholesterol. You should eat at least 2 g per day of these foods for a cholesterol lowering effect.  Read package labels to identify low-saturated fats, trans fat free, and low-fat foods at the supermarket. Select cheeses that have only 2 to 3 g saturated fat per ounce. Use a heart-healthy tub margarine that is free of trans fats or partially hydrogenated oil. When buying baked goods (cookies, crackers), avoid partially hydrogenated oils. Breads and muffins should be made from whole grains (whole-wheat or whole oat flour, instead of "flour" or "enriched flour"). Buy non-creamy canned soups with reduced salt and no added fats.  FOOD PREPARATION TECHNIQUES  Never deep-fry. If you must fry, either stir-fry, which uses very little fat, or use non-stick cooking sprays. When possible, broil, bake, or roast meats, and steam vegetables. Instead of putting butter or margarine on vegetables, use lemon and herbs, applesauce, and cinnamon (for squash and sweet potatoes). Use nonfat   yogurt, salsa, and low-fat dressings for salads.  LOW-SATURATED FAT / LOW-FAT FOOD SUBSTITUTES Meats / Saturated Fat (g)  Avoid: Steak, marbled (3 oz/85 g) / 11 g  Choose: Steak, lean (3 oz/85 g) / 4 g  Avoid: Hamburger (3 oz/85 g) / 7  g  Choose: Hamburger, lean (3 oz/85 g) / 5 g  Avoid: Ham (3 oz/85 g) / 6 g  Choose: Ham, lean cut (3 oz/85 g) / 2.4 g  Avoid: Chicken, with skin, dark meat (3 oz/85 g) / 4 g  Choose: Chicken, skin removed, dark meat (3 oz/85 g) / 2 g  Avoid: Chicken, with skin, light meat (3 oz/85 g) / 2.5 g  Choose: Chicken, skin removed, light meat (3 oz/85 g) / 1 g Dairy / Saturated Fat (g)  Avoid: Whole milk (1 cup) / 5 g  Choose: Low-fat milk, 2% (1 cup) / 3 g  Choose: Low-fat milk, 1% (1 cup) / 1.5 g  Choose: Skim milk (1 cup) / 0.3 g  Avoid: Hard cheese (1 oz/28 g) / 6 g  Choose: Skim milk cheese (1 oz/28 g) / 2 to 3 g  Avoid: Cottage cheese, 4% fat (1 cup) / 6.5 g  Choose: Low-fat cottage cheese, 1% fat (1 cup) / 1.5 g  Avoid: Ice cream (1 cup) / 9 g  Choose: Sherbet (1 cup) / 2.5 g  Choose: Nonfat frozen yogurt (1 cup) / 0.3 g  Choose: Frozen fruit bar / trace  Avoid: Whipped cream (1 tbs) / 3.5 g  Choose: Nondairy whipped topping (1 tbs) / 1 g Condiments / Saturated Fat (g)  Avoid: Mayonnaise (1 tbs) / 2 g  Choose: Low-fat mayonnaise (1 tbs) / 1 g  Avoid: Butter (1 tbs) / 7 g  Choose: Extra light margarine (1 tbs) / 1 g  Avoid: Coconut oil (1 tbs) / 11.8 g  Choose: Olive oil (1 tbs) / 1.8 g  Choose: Corn oil (1 tbs) / 1.7 g  Choose: Safflower oil (1 tbs) / 1.2 g  Choose: Sunflower oil (1 tbs) / 1.4 g  Choose: Soybean oil (1 tbs) / 2.4 g  Choose: Canola oil (1 tbs) / 1 g Document Released: 06/09/2005 Document Revised: 10/04/2012 Document Reviewed: 09/07/2013 [employer] Patient Information 2015 [employer]. This information is not intended to replace advice given to you by your health care provider. Make sure you discuss any questions you have with your health care provider.  

## 2014-11-16 NOTE — Assessment & Plan Note (Addendum)
LDL still not at goal He still refuses to start cholesterol lowering medication Discussed the risk of elevated cholesterol with DM2, including heart attack and stroke Discussed consuming a low fat, low cholesterol diet, taking Fish Oil as directed, and start aerobic exercise

## 2014-11-16 NOTE — Assessment & Plan Note (Addendum)
A1C down to 7.1% He continues to decline flu and pneumovax Encouraged him to make a visit for an eye exam Will refill Metformin and change sig to 1000 mg once daily Will refill test strips to test once daily  RTC in 6 months for follow up of chronic conditions

## 2014-11-16 NOTE — Progress Notes (Signed)
Subjective:    Patient ID: Alan Wise, male    DOB: Jan 06, 1971, 44 y.o.   MRN: [id number]  HPI  Pt presents to the clinic today for 3 month follow up:  DM 2: His A1C 3 months ago was 8.1%. He was taking Metformin but was concerned about diarrhea. We discussed switching to Glipizide but he wanted to hold off as his Metformin had just been refilled. He reports he is taking the Metformin 1000 mg daily. This has decreased the amount of loose stool he is having. His fasting sugars range 129- 150's. His A1C 2 days ago was 7.6%. He declines flu and pneumovax. He has not had an eye exam in the last year. Microalbumin done 07/2014, did show mild proteinuria.  HLD: Cholesterol was elevated at his last visit. His LDL was 180.0. He refused to start on cholesterol lowering medication at that time. He was instructed to take Fish Oil TID and consume a low fat diet. He has not started taking the Fish Oil. His repeat labs from 2 days ago show a LDL of 156.0 with Triglycerides of 252.  Review of Systems      Past Medical History  Diagnosis Date  . History of kidney stones   . Allergy     Current Outpatient Prescriptions  Medication Sig Dispense Refill  . metFORMIN (GLUCOPHAGE) 1000 MG tablet Take 1 tablet (1,000 mg total) by mouth daily with breakfast. 90 tablet 1   No current facility-administered medications for this visit.    No Known Allergies  Family History  Problem Relation Age of Onset  . Hyperlipidemia Father   . Hypertension Father   . Diabetes Father   . Stroke Maternal Grandmother   . Diabetes Maternal Grandmother   . Stroke Paternal Grandmother   . Hypertension Paternal Grandmother     History   Social History  . Marital Status: Single    Spouse Name: N/A  . Number of Children: N/A  . Years of Education: N/A   Occupational History  . Not on file.   Social History Main Topics  . Smoking status: Never Smoker   . Smokeless tobacco: Never Used  . Alcohol Use:  0.0 oz/week    0 Standard drinks or equivalent per week     Comment: rare  . Drug Use: Not on file  . Sexual Activity: Not on file   Other Topics Concern  . Not on file   Social History Narrative     Constitutional: Denies fever, malaise, fatigue, headache or abrupt weight changes.  HEENT: Denies eye pain, eye redness, ear pain, ringing in the ears, wax buildup, runny nose, nasal congestion, bloody nose, or sore throat. Respiratory: Denies difficulty breathing, shortness of breath, cough or sputum production.   Cardiovascular: Denies chest pain, chest tightness, palpitations or swelling in the hands or feet.  Gastrointestinal: Pt reports loose stool. Denies abdominal pain, bloating, constipation, or blood in the stool.  Skin: Denies redness, rashes, lesions or ulcercations.  Neurological: Denies numbness or tingling in hands or feet, difficulty with speech or problems with balance and coordination.   No other specific complaints in a complete review of systems (except as listed in HPI above).  Objective:   Physical Exam  BP 122/84 mmHg  Pulse 70  Temp(Src) 98 F (36.7 C) (Oral)  Wt 179 lb (81.194 kg)  SpO2 98% Wt Readings from Last 3 Encounters:  11/16/14 179 lb (81.194 kg)  08/17/14 175 lb (79.379 kg)  06/12/14 178  lb (80.74 kg)    General: Appears his stated age, well developed, well nourished in NAD. Skin: Warm, dry and intact. No rashes, lesions or ulcerations noted. Cardiovascular: Normal rate and rhythm. S1,S2 noted.  No murmur, rubs or gallops noted.  Pulmonary/Chest: Normal effort and positive vesicular breath sounds. No respiratory distress. No wheezes, rales or ronchi noted.  Abdomen: Soft and nontender. Normal bowel sounds, no bruits noted. No distention or masses noted. Liver, spleen and kidneys non palpable. Neurological: Alert and oriented. Sensation intact to BLE.  BMET    Component Value Date/Time   NA 135 11/14/2014 1008   NA 138 05/04/2012 1815   K  4.3 11/14/2014 1008   K 3.5 05/04/2012 1815   CL 99 11/14/2014 1008   CL 99 05/04/2012 1815   CO2 30 11/14/2014 1008   CO2 29 05/04/2012 1815   GLUCOSE 167* 11/14/2014 1008   GLUCOSE 69 05/04/2012 1815   BUN 14 11/14/2014 1008   BUN 12 05/04/2012 1815   CREATININE 0.84 11/14/2014 1008   CREATININE 1.06 05/04/2012 1815   CALCIUM 9.4 11/14/2014 1008   CALCIUM 9.2 05/04/2012 1815   GFRNONAA >60 05/04/2012 1815   GFRAA >60 05/04/2012 1815    Lipid Panel     Component Value Date/Time   CHOL 207* 11/14/2014 1008   TRIG 225.0* 11/14/2014 1008   HDL 26.80* 11/14/2014 1008   CHOLHDL 8 11/14/2014 1008   VLDL 45.0* 11/14/2014 1008    CBC    Component Value Date/Time   WBC 8.1 11/14/2014 1008   WBC 9.7 05/04/2012 1815   RBC 5.24 11/14/2014 1008   RBC 5.63 05/04/2012 1815   HGB 15.4 11/14/2014 1008   HGB 16.4 05/04/2012 1815   HCT 44.7 11/14/2014 1008   HCT 48.4 05/04/2012 1815   PLT 268.0 11/14/2014 1008   PLT 261 05/04/2012 1815   MCV 85.2 11/14/2014 1008   MCV 86 05/04/2012 1815   MCH 29.1 05/04/2012 1815   MCHC 34.5 11/14/2014 1008   MCHC 33.8 05/04/2012 1815   RDW 12.2 11/14/2014 1008   RDW 12.7 05/04/2012 1815    Hgb A1C Lab Results  Component Value Date   HGBA1C 7.6* 11/14/2014         Assessment & Plan:

## 2014-11-17 ENCOUNTER — Telehealth: Payer: Self-pay

## 2014-12-08 ENCOUNTER — Ambulatory Visit: Payer: Self-pay

## 2015-01-01 ENCOUNTER — Telehealth: Payer: Self-pay | Admitting: Urology

## 2015-01-01 ENCOUNTER — Ambulatory Visit (INDEPENDENT_AMBULATORY_CARE_PROVIDER_SITE_OTHER): Payer: BC Managed Care – PPO | Admitting: Urology

## 2015-01-01 VITALS — BP 122/81 | HR 69 | Ht 68.5 in | Wt 177.4 lb

## 2015-01-01 DIAGNOSIS — N358 Other urethral stricture: Secondary | ICD-10-CM

## 2015-01-02 NOTE — Progress Notes (Signed)
Patient ID: Alan Wise, male   DOB: [birth year], 44 y.o.   MRN: [id number]   Patient was supposed to see Dr. Matilde Sprang so he was rescheduled and not charged for the visit.

## 2015-01-18 ENCOUNTER — Emergency Department
Admission: EM | Admit: 2015-01-18 | Discharge: 2015-01-18 | Disposition: A | Discharge status: Home / Self Care | Payer: BC Managed Care – PPO | Attending: Emergency Medicine | Admitting: Emergency Medicine

## 2015-01-18 ENCOUNTER — Emergency Department: Payer: BC Managed Care – PPO

## 2015-01-18 DIAGNOSIS — N201 Calculus of ureter: Secondary | ICD-10-CM

## 2015-01-18 DIAGNOSIS — Z79899 Other long term (current) drug therapy: Secondary | ICD-10-CM | POA: Diagnosis not present

## 2015-01-18 DIAGNOSIS — E119 Type 2 diabetes mellitus without complications: Secondary | ICD-10-CM | POA: Insufficient documentation

## 2015-01-18 DIAGNOSIS — R109 Unspecified abdominal pain: Secondary | ICD-10-CM | POA: Diagnosis present

## 2015-01-18 LAB — CBC WITH DIFFERENTIAL/PLATELET
BASOS ABS: 0.1 10*3/uL (ref 0–0.1)
Basophils Relative: 1 %
EOS PCT: 3 %
Eosinophils Absolute: 0.2 10*3/uL (ref 0–0.7)
HCT: 46.9 % (ref 40.0–52.0)
Hemoglobin: 16.4 g/dL (ref 13.0–18.0)
LYMPHS PCT: 30 %
Lymphs Abs: 2.4 10*3/uL (ref 1.0–3.6)
MCH: 29.3 pg (ref 26.0–34.0)
MCHC: 34.9 g/dL (ref 32.0–36.0)
MCV: 84 fL (ref 80.0–100.0)
MONO ABS: 0.5 10*3/uL (ref 0.2–1.0)
Monocytes Relative: 7 %
Neutro Abs: 4.6 10*3/uL (ref 1.4–6.5)
Neutrophils Relative %: 59 %
PLATELETS: 240 10*3/uL (ref 150–440)
RBC: 5.58 MIL/uL (ref 4.40–5.90)
RDW: 12.6 % (ref 11.5–14.5)
WBC: 7.8 10*3/uL (ref 3.8–10.6)

## 2015-01-18 LAB — COMPREHENSIVE METABOLIC PANEL
ALK PHOS: 98 U/L (ref 38–126)
ALT: 44 U/L (ref 17–63)
AST: 28 U/L (ref 15–41)
Albumin: 4.5 g/dL (ref 3.5–5.0)
Anion gap: 12 (ref 5–15)
BUN: 13 mg/dL (ref 6–20)
CALCIUM: 9.3 mg/dL (ref 8.9–10.3)
CO2: 23 mmol/L (ref 22–32)
Chloride: 100 mmol/L — ABNORMAL LOW (ref 101–111)
Creatinine, Ser: 0.83 mg/dL (ref 0.61–1.24)
GFR calc Af Amer: >60 mL/min (ref >60)
GFR calc non Af Amer: >60 mL/min (ref >60)
Glucose, Bld: 255 mg/dL — ABNORMAL HIGH (ref 65–99)
POTASSIUM: 3.8 mmol/L (ref 3.5–5.1)
SODIUM: 135 mmol/L (ref 135–145)
Total Bilirubin: 0.7 mg/dL (ref 0.3–1.2)
Total Protein: 7.5 g/dL (ref 6.5–8.1)

## 2015-01-18 LAB — URINALYSIS COMPLETE WITH MICROSCOPIC (ARMC ONLY)
BACTERIA UA: NONE SEEN
Bilirubin Urine: NEGATIVE
Ketones, ur: NEGATIVE mg/dL
Leukocytes, UA: NEGATIVE
Nitrite: NEGATIVE
PROTEIN: 100 mg/dL — AB
SPECIFIC GRAVITY, URINE: 1.022 (ref 1.005–1.030)
Squamous Epithelial / LPF: NONE SEEN
pH: 6 (ref 5.0–8.0)

## 2015-01-18 MED ORDER — HYDROMORPHONE HCL 1 MG/ML IJ SOLN: 1.0000 mg | Freq: Once | INTRAMUSCULAR | Status: DC

## 2015-01-18 MED ORDER — KETOROLAC TROMETHAMINE 30 MG/ML IJ SOLN
INTRAMUSCULAR | Status: AC
  Administered 2015-01-18: 30 mg via INTRAVENOUS
  Filled 2015-01-18: qty 1

## 2015-01-18 MED ORDER — ONDANSETRON HCL 4 MG/2ML IJ SOLN: 4.0000 mg | Freq: Once | INTRAMUSCULAR | Status: DC

## 2015-01-18 MED ORDER — KETOROLAC TROMETHAMINE 30 MG/ML IJ SOLN
30.0000 mg | Freq: Once | INTRAMUSCULAR | Status: AC
  Administered 2015-01-18: 30 mg via INTRAVENOUS

## 2015-01-18 MED ORDER — OXYCODONE-ACETAMINOPHEN 5-325 MG PO TABS: 1.0000 | ORAL_TABLET | Freq: Four times a day (QID) | ORAL | Status: DC | PRN

## 2015-01-18 MED ORDER — SODIUM CHLORIDE 0.9 % IV SOLN
Freq: Once | INTRAVENOUS | Status: AC
  Administered 2015-01-18: 07:00:00 via INTRAVENOUS

## 2015-01-18 NOTE — ED Provider Notes (Signed)
[facility] Emergency Department Provider Note  Time seen: 7:38 AM  I have reviewed the triage vital signs and the nursing notes.   HISTORY  Chief Complaint Flank Pain    HPI Alan Wise is a 44 y.o. male with a past medical history of kidney stones, diabetes, hyperlipidemia who presents the emergency department with 2 days of left flank pain. According to the patient he developed left flank pain Tuesday night. He states yesterday he went to an urgent care and was diagnosed with a likely kidney stone prescribed ibuprofen. Patient states he was doing better last night, but the pain returned today and was worse. Patient states nausea and vomiting. Denies diarrhea or constipation. Denies dysuria. Denies fever. Urgent care did place the patient on ciprofloxacin and Flomax as well.Describes his pain currently as 10/10. Sharp and left-sided. Radiating to the groin.     Past Medical History  Diagnosis Date  . History of kidney stones   . Allergy   . Diabetes mellitus   . Hyperlipidemia     Patient Active Problem List   Diagnosis Date Noted  . Stricture, urethra 08/17/2014  . Type 2 diabetes mellitus with hyperglycemia 06/12/2014  . HLD (hyperlipidemia) 06/12/2014    Past Surgical History  Procedure Laterality Date  . Knee arthroscopy w/ meniscal repair Right 1990  . Wisdom tooth extraction    . Skin cancer excision  2014  . Urethral stricture dilatation      Current Outpatient Rx  Name  Route  Sig  Dispense  Refill  . glucose blood (FREESTYLE LITE) test strip   Other   1 each by Other route 2 (two) times daily as needed for other.   50 each   12   . metFORMIN (GLUCOPHAGE) 1000 MG tablet   Oral   Take 1 tablet (1,000 mg total) by mouth daily with breakfast.   90 tablet   1     Allergies Review of patient's allergies indicates no known allergies.  Family History  Problem Relation Age of Onset  . Hyperlipidemia Father   .  Hypertension Father   . Diabetes Father   . Stroke Maternal Grandmother   . Diabetes Maternal Grandmother   . Stroke Paternal Grandmother   . Hypertension Paternal Grandmother   . Kidney disease Neg Hx   . Prostate cancer Neg Hx   . Benign prostatic hyperplasia Father     Social History History  Substance Use Topics  . Smoking status: Never Smoker   . Smokeless tobacco: Never Used  . Alcohol Use: 0.0 oz/week    0 Standard drinks or equivalent per week     Comment: rare    Review of Systems Constitutional: Negative for fever Cardiovascular: Negative for chest pain. Respiratory: Negative for shortness of breath. Gastrointestinal: Left-sided abdominal pain. Genitourinary: Negative for dysuria. Musculoskeletal: Mild left back pain. 10-point ROS otherwise negative.  ____________________________________________   PHYSICAL EXAM:  VITAL SIGNS: ED Triage Vitals  Enc Vitals Group     BP --      Pulse Rate 01/18/15 0700 82     Resp 01/18/15 0700 22     Temp 01/18/15 0700 98 F (36.7 C)     Temp Source 01/18/15 0700 Oral     SpO2 01/18/15 0700 99 %     Weight 01/18/15 0700 175 lb (79.379 kg)     Height 01/18/15 0700 5\' 8"  (1.727 m)     Head Cir --  Peak Flow --      Pain Score 01/18/15 0700 6     Pain Loc --      Pain Edu? --      Excl. in [place]? --     Constitutional: Alert and oriented. Moderate distress due to pain. Eyes: Normal exam ENT   Head: Normocephalic and atraumatic. Cardiovascular: Normal rate, regular rhythm. No murmur Respiratory: Normal respiratory effort without tachypnea nor retractions. Breath sounds are clear  Gastrointestinal: Soft and nontender. No distention.  No CVA tenderness palpation Musculoskeletal: Nontender with normal range of motion in all extremities. Neurologic:  Normal speech and language. No gross focal neurologic deficits  Skin:  Skin is warm, dry and intact.  Psychiatric: Mood and affect are normal. Speech and behavior are  normal.   ____________________________________________     RADIOLOGY  Ultrasound shows mild hydronephrosis, possible partial obstructing stone.   ____________________________________________    INITIAL IMPRESSION / ASSESSMENT AND PLAN / ED COURSE  Pertinent labs & imaging results that were available during my care of the patient were reviewed by me and considered in my medical decision making (see chart for details).  Patient with left flank pain radiating to his left groin. Symptoms most consistent with ureteral lithiasis. We will obtain a renal ultrasound to help further evaluate, along with lab work. We will treat pain, and IV hydrate while awaiting results. Patient agreeable to plan.  Ultrasound consistent with likely stone.Urine shows red blood cells, labs otherwise within normal limits. Workup most consistent with ureterolithiasis. Patient currently taking Flomax and ciprofloxacin, we will add Percocet for pain relief, and the patient is follow up with his urologist ____________________________________________   FINAL CLINICAL IMPRESSION(S) / ED DIAGNOSES  Left flank pain Ureterolithiasis   Harvest Dark, MD 01/18/15 [id number]

## 2015-01-18 NOTE — ED Notes (Signed)
Pt here with left side flank and groin pain. Pt states that he pain started 2 days ago and that he was out of town, he went to an UC and was given ibp. Pt states that the pain has cont to increase.  Pt has a hx of kidney stones in the past. Pt in NAD at this time will cont to monitor pt at all times.

## 2015-01-18 NOTE — ED Notes (Signed)
Pt resting comfortable with his eye closed. Pt appears to be more comfortable at this time

## 2015-01-18 NOTE — ED Notes (Addendum)
Patient ambulatory to triage with steady gait, without difficulty, appears uncomfortable, grimacing; pt reports left flank pain radiating into scrotum/abd since 8pm accomp by N/V; st hx kidney stones; seen at urgent care last night and rx ibuprofen without relief; unable to obtain BP due to pt's restlessness at present

## 2015-01-18 NOTE — Discharge Instructions (Signed)
Kidney Stones °Kidney stones (urolithiasis) are solid masses that form inside your kidneys. The intense pain is caused by the stone moving through the kidney, ureter, bladder, and urethra (urinary tract). When the stone moves, the ureter starts to spasm around the stone. The stone is usually passed in your pee (urine).  °HOME CARE °· Drink enough fluids to keep your pee clear or pale yellow. This helps to get the stone out. °· Strain all pee through the provided strainer. Do not pee without peeing through the strainer, not even once. If you pee the stone out, catch it in the strainer. The stone may be as small as a grain of salt. Take this to your doctor. This will help your doctor figure out what you can do to try to prevent more kidney stones. °· Only take medicine as told by your doctor. °· Follow up with your doctor as told. °· Get follow-up X-rays as told by your doctor. °GET HELP IF: °You have pain that gets worse even if you have been taking pain medicine. °GET HELP RIGHT AWAY IF:  °· Your pain does not get better with medicine. °· You have a fever or shaking chills. °· Your pain increases and gets worse over 18 hours. °· You have new belly (abdominal) pain. °· You feel faint or pass out. °· You are unable to pee. °MAKE SURE YOU:  °· Understand these instructions. °· Will watch your condition. °· Will get help right away if you are not doing well or get worse. °Document Released: 11/26/2007 Document Revised: 02/09/2013 Document Reviewed: 11/10/2012 °[employer]® Patient Information ©2015 [employer]. This information is not intended to replace advice given to you by your health care provider. Make sure you discuss any questions you have with your health care provider. ° °

## 2015-02-04 ENCOUNTER — Emergency Department
Admission: EM | Admit: 2015-02-04 | Discharge: 2015-02-04 | Disposition: A | Discharge status: Home / Self Care | Payer: BC Managed Care – PPO | Attending: Emergency Medicine | Admitting: Emergency Medicine

## 2015-02-04 ENCOUNTER — Emergency Department: Payer: BC Managed Care – PPO

## 2015-02-04 DIAGNOSIS — N23 Unspecified renal colic: Secondary | ICD-10-CM | POA: Diagnosis not present

## 2015-02-04 DIAGNOSIS — Z79899 Other long term (current) drug therapy: Secondary | ICD-10-CM | POA: Insufficient documentation

## 2015-02-04 DIAGNOSIS — N201 Calculus of ureter: Secondary | ICD-10-CM | POA: Diagnosis not present

## 2015-02-04 DIAGNOSIS — R109 Unspecified abdominal pain: Secondary | ICD-10-CM | POA: Diagnosis present

## 2015-02-04 DIAGNOSIS — E119 Type 2 diabetes mellitus without complications: Secondary | ICD-10-CM | POA: Diagnosis not present

## 2015-02-04 LAB — CBC
HCT: 46.2 % (ref 40.0–52.0)
HEMOGLOBIN: 16 g/dL (ref 13.0–18.0)
MCH: 29 pg (ref 26.0–34.0)
MCHC: 34.5 g/dL (ref 32.0–36.0)
MCV: 84.1 fL (ref 80.0–100.0)
PLATELETS: 245 10*3/uL (ref 150–440)
RBC: 5.49 MIL/uL (ref 4.40–5.90)
RDW: 12.6 % (ref 11.5–14.5)
WBC: 13.6 10*3/uL — ABNORMAL HIGH (ref 3.8–10.6)

## 2015-02-04 LAB — URINALYSIS COMPLETE WITH MICROSCOPIC (ARMC ONLY)
Bacteria, UA: NONE SEEN
Bilirubin Urine: NEGATIVE
GLUCOSE, UA: NEGATIVE mg/dL
Leukocytes, UA: NEGATIVE
Nitrite: NEGATIVE
Protein, ur: 100 mg/dL — AB
Specific Gravity, Urine: 1.029 (ref 1.005–1.030)
pH: 5 (ref 5.0–8.0)

## 2015-02-04 LAB — BASIC METABOLIC PANEL
Anion gap: 13 (ref 5–15)
BUN: 14 mg/dL (ref 6–20)
CHLORIDE: 98 mmol/L — AB (ref 101–111)
CO2: 24 mmol/L (ref 22–32)
Calcium: 9.7 mg/dL (ref 8.9–10.3)
Creatinine, Ser: 0.94 mg/dL (ref 0.61–1.24)
GFR calc Af Amer: >60 mL/min (ref >60)
Glucose, Bld: 191 mg/dL — ABNORMAL HIGH (ref 65–99)
Potassium: 3.5 mmol/L (ref 3.5–5.1)
SODIUM: 135 mmol/L (ref 135–145)

## 2015-02-04 IMAGING — CT CT RENAL STONE PROTOCOL
1 of 2 series · 15 of 32 positions shown, 19 images · non-contrast
Comparison: Renal ultrasound performed [DATE], and CT of the
abdomen and pelvis performed [DATE]

CLINICAL DATA: Acute onset of left flank pain.  Initial encounter.

EXAM:
CT ABDOMEN AND PELVIS WITHOUT CONTRAST
TECHNIQUE: Multidetector CT imaging of the abdomen and pelvis was performed
following the standard protocol without IV contrast.

[Series 2: stone standard full · axial · 0.71mm/px · z∈[-521,-56]mm · 15 of 103 slices shown, 19 images]
[im 5/103  soft-tissue]
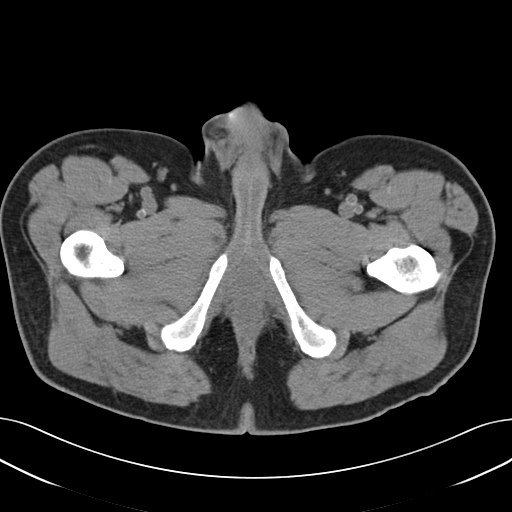
[im 5/103  bone]
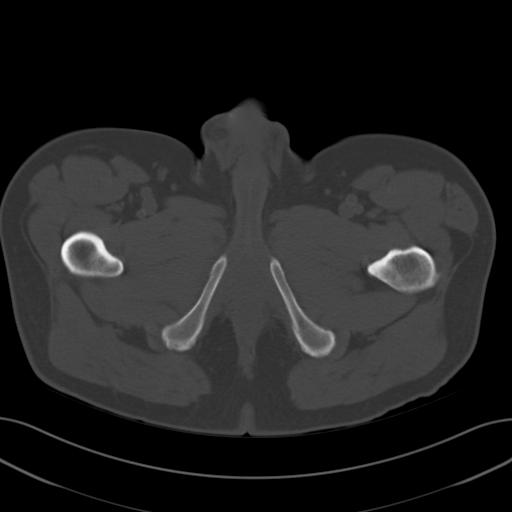
[im 13/103  soft-tissue]
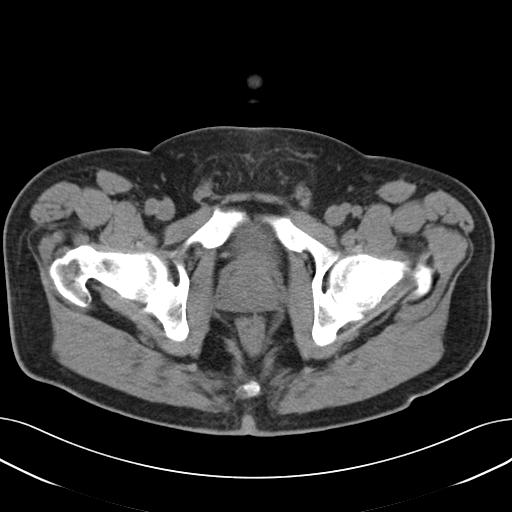
[im 21/103  soft-tissue]
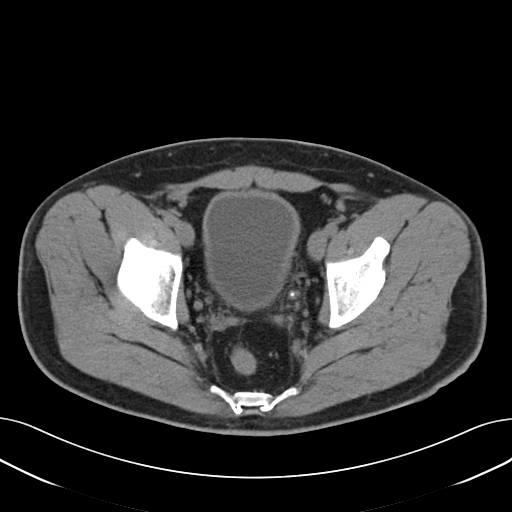
[im 29/103  soft-tissue]
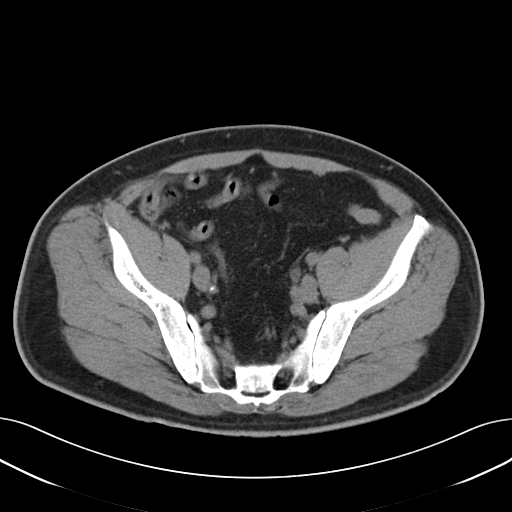
[im 37/103  soft-tissue]
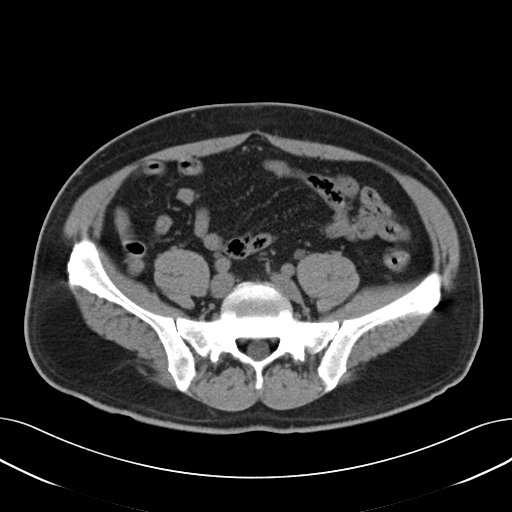
[im 45/103  soft-tissue]
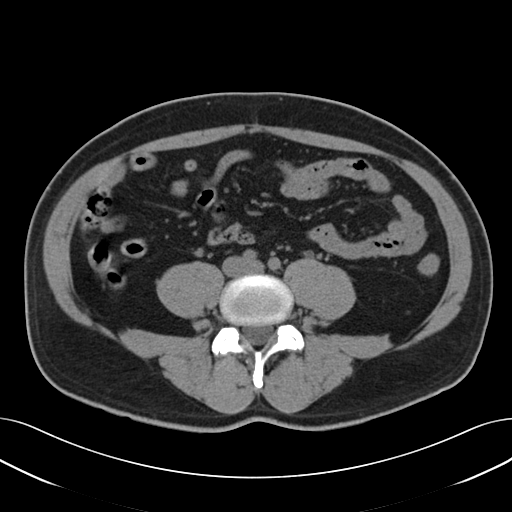
[im 54/103  soft-tissue]
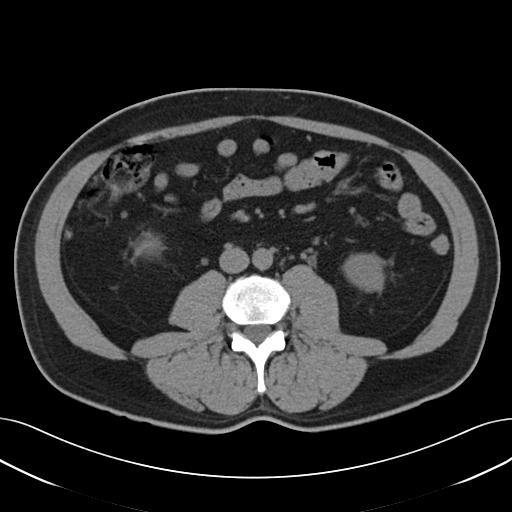
[im 58/103  soft-tissue]
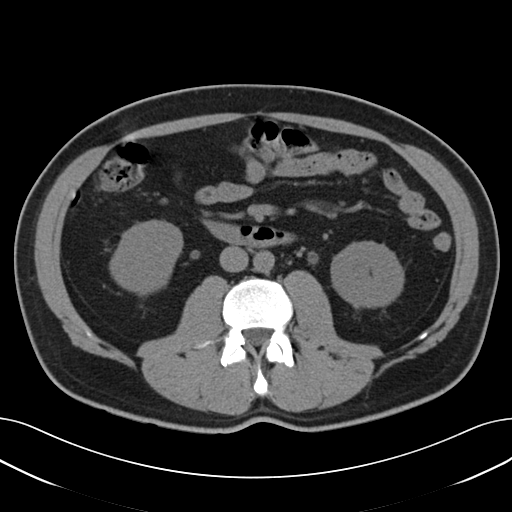
[im 66/103  soft-tissue]
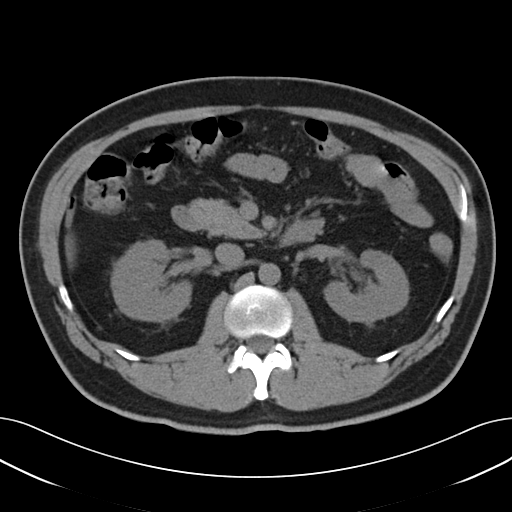
[im 66/103  bone]
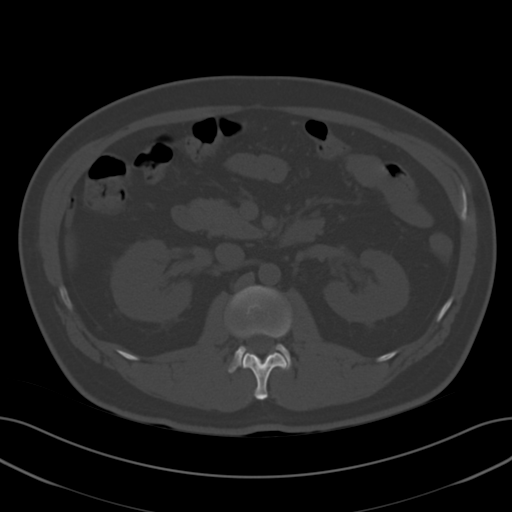
[im 74/103  soft-tissue]
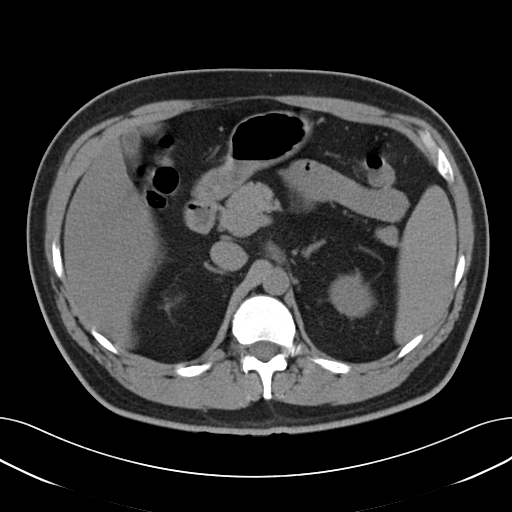
[im 82/103  soft-tissue]
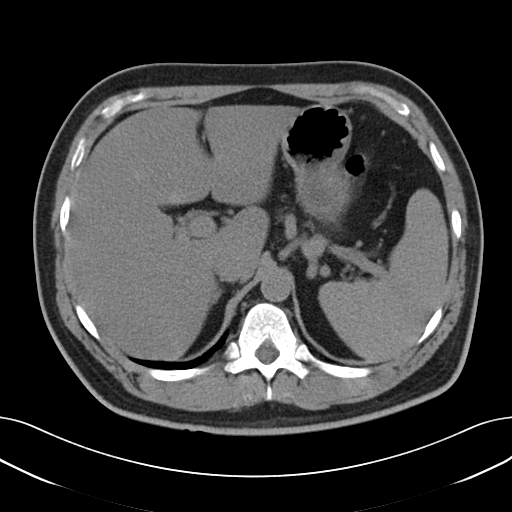
[im 86/103  lung]
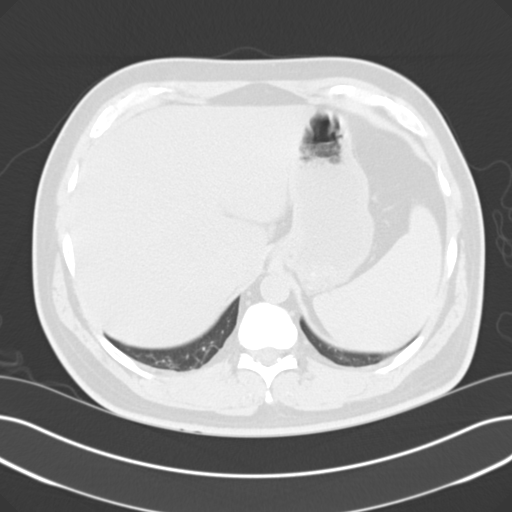
[im 90/103  soft-tissue]
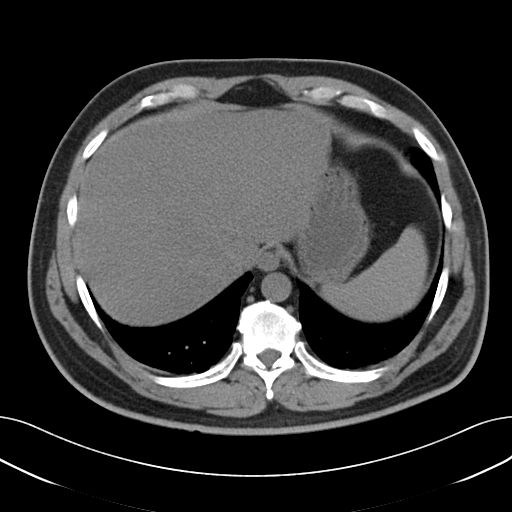
[im 90/103  lung]
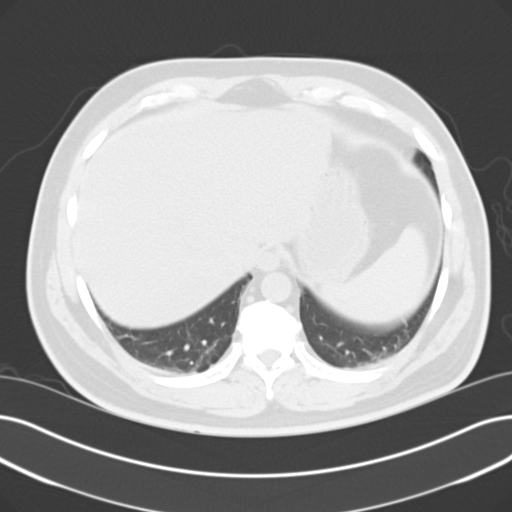
[im 94/103  lung]
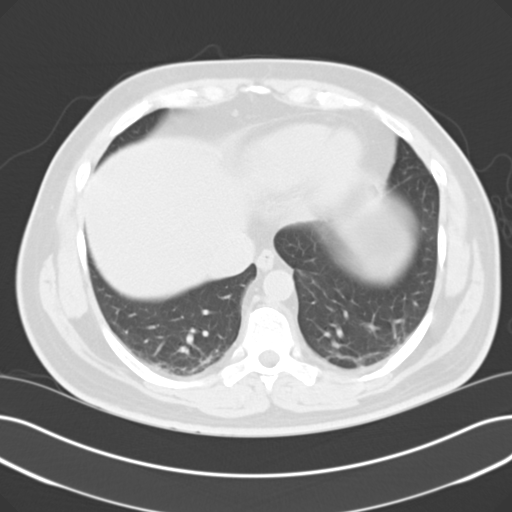
[im 98/103  soft-tissue]
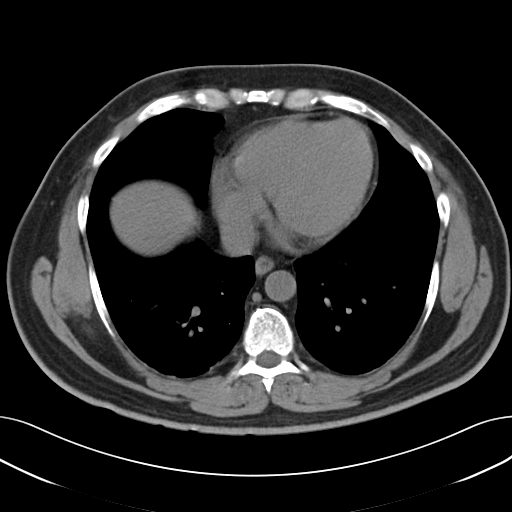
[im 98/103  lung]
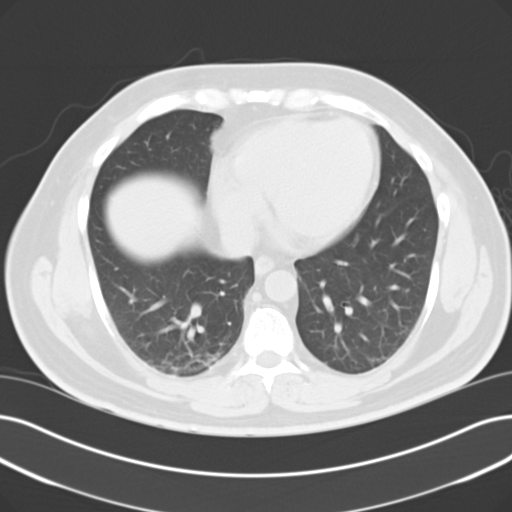

[15 of 32 positions shown; findings below may reference images not displayed]

FINDINGS: Minimal bibasilar atelectasis is noted.

The liver is unremarkable in appearance. The spleen is mildly
enlarged, measuring 13.6 cm in length. The gallbladder is within
normal limits. The pancreas and adrenal glands are unremarkable.

Minimal left-sided hydronephrosis is noted, with prominence of the
left ureter along its entire course. An obstructing 4 mm stone is
noted at the distal left ureter, just above the left vesicoureteral
junction. Bilateral perinephric stranding is noted.

A 3 mm nonobstructing stone is noted at the interpole region of the
right kidney.

No free fluid is identified. The small bowel is unremarkable in
appearance. The stomach is within normal limits. No acute vascular
abnormalities are seen.

The appendix is normal in caliber and contains air, extending to the
inferior tip of the liver, without evidence of appendicitis. The
colon is unremarkable in appearance.

The bladder is mildly distended and grossly unremarkable. A small
urachal remnant is incidentally noted. The prostate remains normal
in size, with minimal calcification. No inguinal lymphadenopathy is
seen.

No acute osseous abnormalities are identified.
IMPRESSION: 1. Minimal left-sided hydronephrosis, with prominence of the left
ureter along its entire course. Obstructing 4 mm stone at the distal
left ureter, just above the left vesicoureteral junction.
2. 3 mm nonobstructing stone at the interpole region of the right
kidney.
3. Mild splenomegaly.

## 2015-02-04 MED ORDER — OXYCODONE-ACETAMINOPHEN 5-325 MG PO TABS: 1.0000 | ORAL_TABLET | Freq: Four times a day (QID) | ORAL | Status: DC | PRN

## 2015-02-04 MED ORDER — SODIUM CHLORIDE 0.9 % IV SOLN
1000.0000 mL | Freq: Once | INTRAVENOUS | Status: AC
  Administered 2015-02-04: 1000 mL via INTRAVENOUS

## 2015-02-04 MED ORDER — ONDANSETRON HCL 4 MG/2ML IJ SOLN
4.0000 mg | Freq: Once | INTRAMUSCULAR | Status: AC
  Administered 2015-02-04: 4 mg via INTRAVENOUS
  Filled 2015-02-04: qty 2

## 2015-02-04 MED ORDER — KETOROLAC TROMETHAMINE 30 MG/ML IJ SOLN
30.0000 mg | Freq: Once | INTRAMUSCULAR | Status: AC
  Administered 2015-02-04: 30 mg via INTRAVENOUS
  Filled 2015-02-04: qty 1

## 2015-02-04 NOTE — Discharge Instructions (Signed)
You have a 4 mm stone towards the end of your ureter. This will likely pass on its own. Take oxycodone if needed for pain control. Follow-up with the urologist you mentioned in the emergency department. Return to the emergency department if your pain is worse, if you have fever, or if you have other urgent concerns.  Kidney Stones Kidney stones (urolithiasis) are deposits that form inside your kidneys. The intense pain is caused by the stone moving through the urinary tract. When the stone moves, the ureter goes into spasm around the stone. The stone is usually passed in the urine.  CAUSES   A disorder that makes certain neck glands produce too much parathyroid hormone (primary hyperparathyroidism).  A buildup of uric acid crystals, similar to gout in your joints.  Narrowing (stricture) of the ureter.  A kidney obstruction present at birth (congenital obstruction).  Previous surgery on the kidney or ureters.  Numerous kidney infections. SYMPTOMS   Feeling sick to your stomach (nauseous).  Throwing up (vomiting).  Blood in the urine (hematuria).  Pain that usually spreads (radiates) to the groin.  Frequency or urgency of urination. DIAGNOSIS   Taking a history and physical exam.  Blood or urine tests.  CT scan.  Occasionally, an examination of the inside of the urinary bladder (cystoscopy) is performed. TREATMENT   Observation.  Increasing your fluid intake.  Extracorporeal shock wave lithotripsy--This is a noninvasive procedure that uses shock waves to break up kidney stones.  Surgery may be needed if you have severe pain or persistent obstruction. There are various surgical procedures. Most of the procedures are performed with the use of small instruments. Only small incisions are needed to accommodate these instruments, so recovery time is minimized. The size, location, and chemical composition are all important variables that will determine the proper choice of  action for you. Talk to your health care provider to better understand your situation so that you will minimize the risk of injury to yourself and your kidney.  HOME CARE INSTRUCTIONS   Drink enough water and fluids to keep your urine clear or pale yellow. This will help you to pass the stone or stone fragments.  Strain all urine through the provided strainer. Keep all particulate matter and stones for your health care provider to see. The stone causing the pain may be as small as a grain of salt. It is very important to use the strainer each and every time you pass your urine. The collection of your stone will allow your health care provider to analyze it and verify that a stone has actually passed. The stone analysis will often identify what you can do to reduce the incidence of recurrences.  Only take over-the-counter or prescription medicines for pain, discomfort, or fever as directed by your health care provider.  Make a follow-up appointment with your health care provider as directed.  Get follow-up X-rays if required. The absence of pain does not always mean that the stone has passed. It may have only stopped moving. If the urine remains completely obstructed, it can cause loss of kidney function or even complete destruction of the kidney. It is your responsibility to make sure X-rays and follow-ups are completed. Ultrasounds of the kidney can show blockages and the status of the kidney. Ultrasounds are not associated with any radiation and can be performed easily in a matter of minutes. SEEK MEDICAL CARE IF:  You experience pain that is progressive and unresponsive to any pain medicine you have been  prescribed. SEEK IMMEDIATE MEDICAL CARE IF:   Pain cannot be controlled with the prescribed medicine.  You have a fever or shaking chills.  The severity or intensity of pain increases over 18 hours and is not relieved by pain medicine.  You develop a new onset of abdominal pain.  You feel  faint or pass out.  You are unable to urinate. MAKE SURE YOU:   Understand these instructions.  Will watch your condition.  Will get help right away if you are not doing well or get worse. Document Released: 06/09/2005 Document Revised: 02/09/2013 Document Reviewed: 11/10/2012 [facility] Patient Information 2015 [place], [place]. This information is not intended to replace advice given to you by your health care provider. Make sure you discuss any questions you have with your health care provider.

## 2015-02-04 NOTE — ED Provider Notes (Signed)
[facility] Emergency Department Provider Note  ____________________________________________  Time seen: 3 AM  I have reviewed the triage vital signs and the nursing notes.   HISTORY  Chief Complaint Flank Pain  left side    HPI Alan Wise is a 44 y.o. male with history of kidney stones in the past. He was also seen approximately 2 weeks ago in the emergency department for a stone. Had that time he had an ultrasound which showed some mild hydronephrosis and a possible stone. He reports he thinks that has cleared that he has another stone today. He is not completely sure the other stone cleared and he is concerned because he has a known history of a urethral stricture.  Tonight he had the onset of left flank pain. It radiates down toward his groin. It feels a little bit different than what he had 2 weeks ago. He feels that is higher up.  He has been treated with Toradol in the emergency department prior to my interview and exam based on his symptoms and history. He reports he is feeling significantly better than when he did when he first arrived.    Past Medical History  Diagnosis Date  . History of kidney stones   . Allergy   . Diabetes mellitus   . Hyperlipidemia     Patient Active Problem List   Diagnosis Date Noted  . Stricture, urethra 08/17/2014  . Type 2 diabetes mellitus with hyperglycemia 06/12/2014  . HLD (hyperlipidemia) 06/12/2014    Past Surgical History  Procedure Laterality Date  . Knee arthroscopy w/ meniscal repair Right 1990  . Wisdom tooth extraction    . Skin cancer excision  2014  . Urethral stricture dilatation      Current Outpatient Rx  Name  Route  Sig  Dispense  Refill  . glucose blood (FREESTYLE LITE) test strip   Other   1 each by Other route 2 (two) times daily as needed for other.   50 each   12   . metFORMIN (GLUCOPHAGE) 1000 MG tablet   Oral   Take 1 tablet (1,000 mg total) by mouth daily with  breakfast.   90 tablet   1   . oxyCODONE-acetaminophen (ROXICET) 5-325 MG per tablet   Oral   Take 1 tablet by mouth every 6 (six) hours as needed.   20 tablet   0     Allergies Review of patient's allergies indicates no known allergies.  Family History  Problem Relation Age of Onset  . Hyperlipidemia Father   . Hypertension Father   . Diabetes Father   . Stroke Maternal Grandmother   . Diabetes Maternal Grandmother   . Stroke Paternal Grandmother   . Hypertension Paternal Grandmother   . Kidney disease Neg Hx   . Prostate cancer Neg Hx   . Benign prostatic hyperplasia Father     Social History Social History  Substance Use Topics  . Smoking status: Never Smoker   . Smokeless tobacco: Never Used  . Alcohol Use: 0.0 oz/week    0 Standard drinks or equivalent per week     Comment: rare    Review of Systems  Constitutional: Negative for fever. ENT: Negative for sore throat. Cardiovascular: Negative for chest pain. Respiratory: Negative for shortness of breath. Gastrointestinal: Left flank pain. See history of present illness. Genitourinary: History of kidney stones with current left flank pain. See history of present illness Musculoskeletal: No myalgias or injuries. Skin: Negative for rash. Neurological:  Negative for headaches   10-point ROS otherwise negative.  ____________________________________________   PHYSICAL EXAM:  VITAL SIGNS: ED Triage Vitals  Enc Vitals Group     BP 02/04/15 0209 163/115 mmHg     Pulse Rate 02/04/15 0209 101     Resp 02/04/15 0209 26     Temp 02/04/15 0209 97.5 F (36.4 C)     Temp Source 02/04/15 0209 Oral     SpO2 02/04/15 0209 98 %     Weight 02/04/15 0209 173 lb (78.472 kg)     Height 02/04/15 0209 5\' 8"  (1.727 m)     Head Cir --      Peak Flow --      Pain Score 02/04/15 0208 6     Pain Loc --      Pain Edu? --      Excl. in [place]? --     Constitutional:  Alert and oriented. Patient appears slightly fatigued  and a little uncomfortable, but no acute distress.[facility]   Head: Normocephalic and atraumatic.   Nose: No congestion/rhinnorhea. Cardiovascular: Normal rate, regular rhythm, no murmur noted Respiratory:  Normal respiratory effort, no tachypnea.    Breath sounds are clear and equal bilaterally.  Gastrointestinal: Soft. Minimal tenderness in the left lower abdomen. No distention.  Back: No muscle spasm, no tenderness, mild CVA tenderness on the left. Musculoskeletal: No deformity noted. Nontender with normal range of motion in all extremities.  No noted edema. Neurologic:  Normal speech and language. No gross focal neurologic deficits are appreciated.  Skin:  Skin is warm, dry. No rash noted. Psychiatric: Mood and affect are normal. Speech and behavior are normal.  ____________________________________________    LABS (pertinent positives/negatives)  Labs Reviewed  BASIC METABOLIC PANEL - Abnormal; Notable for the following:    Chloride 98 (*)    Glucose, Bld 191 (*)    All other components within normal limits  CBC - Abnormal; Notable for the following:    WBC 13.6 (*)    All other components within normal limits  URINALYSIS COMPLETEWITH MICROSCOPIC ([facility] ONLY) - Abnormal; Notable for the following:    Color, Urine AMBER (*)    APPearance HAZY (*)    Ketones, ur TRACE (*)    Hgb urine dipstick 3+ (*)    Protein, ur 100 (*)    Squamous Epithelial / LPF 0-5 (*)    All other components within normal limits     ____________________________________________  RADIOLOGY  CT abdomen pelvis for renal stone IMPRESSION: 1. Minimal left-sided hydronephrosis, with prominence of the left ureter along its entire course. Obstructing 4 mm stone at the distal left ureter, just above the left vesicoureteral junction. 2. 3 mm nonobstructing stone at the interpole region of the right kidney. 3. Mild splenomegaly.  ____________________________________________  INITIAL IMPRESSION /  ASSESSMENT AND PLAN / ED COURSE  Pertinent labs & imaging results that were available during my care of the patient were reviewed by me and considered in my medical decision making (see chart for details).   44 year old male with history of renal stones with pain that is likely due to another stone. We have discussed the pros and cons of doing a CT scan. The patient would like to have a CT scan for further evaluation of this recurrent problem. Given his slight elevation in white blood cell count and recurrent problems, I think this is reasonable. We will look for any stranding or inflammatory changes as well as more clearly define the size  of a stone and see if he has increase hydronephrosis compared to 2 weeks ago.  He has been treated with Toradol. He is more comfortable now is declining any further pain medication.  ____________________________________________   FINAL CLINICAL IMPRESSION(S) / ED DIAGNOSES  Final diagnoses:  Renal colic on left side  Ureterolithiasis      Ahmed Prima, MD 02/04/15 [phone]

## 2015-02-04 NOTE — ED Notes (Signed)
Patient reports left flank pain.  Reports hx of previous kidney stones.

## 2015-03-19 ENCOUNTER — Encounter: Payer: Self-pay | Admitting: Internal Medicine

## 2015-03-19 ENCOUNTER — Ambulatory Visit (INDEPENDENT_AMBULATORY_CARE_PROVIDER_SITE_OTHER): Payer: BC Managed Care – PPO | Admitting: Internal Medicine

## 2015-03-19 VITALS — BP 140/86 | HR 75 | Temp 97.9°F | Wt 176.2 lb

## 2015-03-19 DIAGNOSIS — L301 Dyshidrosis [pompholyx]: Secondary | ICD-10-CM

## 2015-03-19 DIAGNOSIS — M25561 Pain in right knee: Secondary | ICD-10-CM

## 2015-03-19 MED ORDER — TRIAMCINOLONE ACETONIDE 0.5 % EX OINT: 1.0000 "application " | TOPICAL_OINTMENT | Freq: Two times a day (BID) | CUTANEOUS | Status: DC

## 2015-03-19 NOTE — Progress Notes (Signed)
Pre visit review using our clinic review tool, if applicable. No additional management support is needed unless otherwise documented below in the visit note. 

## 2015-03-19 NOTE — Progress Notes (Signed)
Subjective:    Patient ID: Alan Wise, male    DOB: Apr 18, 1971, 44 y.o.   MRN: [id number]  HPI  Pt presents to the clinic today with c/o irritation to the tip of his right middle finger. He reports this started a few months ago. It gets dry, scaly and cracks open. It is painful at times. His symptoms are intermittent and seem to get better and worse at varying times. He has put lotion and neosporin on it with minimal relief. He is concerned that it may be infected. He denies redness, warmth, fever or chills.  He also c/o right knee pain. This started 2 months ago. He describes the pain as soreness. It seems worse when he is walking up stairs. He denies any joint swelling. He denies any injury to the area. He has not tried anything OTC for it. He has had a scope of the right knee in the past and repair of a meniscal injury.  Review of Systems      Past Medical History  Diagnosis Date  . History of kidney stones   . Allergy   . Diabetes mellitus   . Hyperlipidemia     Current Outpatient Prescriptions  Medication Sig Dispense Refill  . glucose blood (FREESTYLE LITE) test strip 1 each by Other route 2 (two) times daily as needed for other. 50 each 12  . metFORMIN (GLUCOPHAGE) 1000 MG tablet Take 1 tablet (1,000 mg total) by mouth daily with breakfast. 90 tablet 1  . oxyCODONE-acetaminophen (ROXICET) 5-325 MG per tablet Take 1 tablet by mouth every 6 (six) hours as needed. 20 tablet 0   No current facility-administered medications for this visit.    No Known Allergies  Family History  Problem Relation Age of Onset  . Hyperlipidemia Father   . Hypertension Father   . Diabetes Father   . Stroke Maternal Grandmother   . Diabetes Maternal Grandmother   . Stroke Paternal Grandmother   . Hypertension Paternal Grandmother   . Kidney disease Neg Hx   . Prostate cancer Neg Hx   . Benign prostatic hyperplasia Father     Social History   Social History  . Marital Status:  Single    Spouse Name: N/A  . Number of Children: N/A  . Years of Education: N/A   Occupational History  . Not on file.   Social History Main Topics  . Smoking status: Never Smoker   . Smokeless tobacco: Never Used  . Alcohol Use: 0.0 oz/week    0 Standard drinks or equivalent per week     Comment: rare  . Drug Use: No  . Sexual Activity: Not on file   Other Topics Concern  . Not on file   Social History Narrative     Constitutional: Denies fever, malaise, fatigue, headache or abrupt weight changes.  Respiratory: Denies difficulty breathing, shortness of breath, cough or sputum production.   Cardiovascular: Denies chest pain, chest tightness, palpitations or swelling in the hands or feet.  Musculoskeletal: Pt reports right knee pain. Denies decrease in range of motion, difficulty with gait, muscle pain or joint swelling.  Skin: Pt reports irritation to tip of right middle finger. Denies, lesions or ulcercations.    No other specific complaints in a complete review of systems (except as listed in HPI above).  Objective:   Physical Exam   BP 140/86 mmHg  Pulse 75  Temp(Src) 97.9 F (36.6 C) (Oral)  Wt 176 lb 4 oz (79.946 kg)  SpO2 97% Wt Readings from Last 3 Encounters:  03/19/15 176 lb 4 oz (79.946 kg)  02/04/15 173 lb (78.472 kg)  01/18/15 175 lb (79.379 kg)    General: Appears his stated age, well developed, well nourished in NAD. Skin: Scaly, cracked skin noted on tip of right middle finger.  Cardiovascular: Normal rate and rhythm. S1,S2 noted.   Pulmonary/Chest: Normal effort and positive vesicular breath sounds. No respiratory distress. No wheezes, rales or ronchi noted.  Musculoskeletal: Normal flexion and extension of the right knee. No pain with palpation. No joint swelling noted. Strength 5/5 BLE. No difficulty with gait.  BMET    Component Value Date/Time   NA 135 02/04/2015 0224   NA 138 05/04/2012 1815   K 3.5 02/04/2015 0224   K 3.5 05/04/2012  1815   CL 98* 02/04/2015 0224   CL 99 05/04/2012 1815   CO2 24 02/04/2015 0224   CO2 29 05/04/2012 1815   GLUCOSE 191* 02/04/2015 0224   GLUCOSE 69 05/04/2012 1815   BUN 14 02/04/2015 0224   BUN 12 05/04/2012 1815   CREATININE 0.94 02/04/2015 0224   CREATININE 1.06 05/04/2012 1815   CALCIUM 9.7 02/04/2015 0224   CALCIUM 9.2 05/04/2012 1815   GFRNONAA >60 02/04/2015 0224   GFRNONAA >60 05/04/2012 1815   GFRAA >60 02/04/2015 0224   GFRAA >60 05/04/2012 1815    Lipid Panel     Component Value Date/Time   CHOL 207* 11/14/2014 1008   TRIG 225.0* 11/14/2014 1008   HDL 26.80* 11/14/2014 1008   CHOLHDL 8 11/14/2014 1008   VLDL 45.0* 11/14/2014 1008    CBC    Component Value Date/Time   WBC 13.6* 02/04/2015 0224   WBC 9.7 05/04/2012 1815   RBC 5.49 02/04/2015 0224   RBC 5.63 05/04/2012 1815   HGB 16.0 02/04/2015 0224   HGB 16.4 05/04/2012 1815   HCT 46.2 02/04/2015 0224   HCT 48.4 05/04/2012 1815   PLT 245 02/04/2015 0224   PLT 261 05/04/2012 1815   MCV 84.1 02/04/2015 0224   MCV 86 05/04/2012 1815   MCH 29.0 02/04/2015 0224   MCH 29.1 05/04/2012 1815   MCHC 34.5 02/04/2015 0224   MCHC 33.8 05/04/2012 1815   RDW 12.6 02/04/2015 0224   RDW 12.7 05/04/2012 1815   LYMPHSABS 2.4 01/18/2015 0713   MONOABS 0.5 01/18/2015 0713   EOSABS 0.2 01/18/2015 0713   BASOSABS 0.1 01/18/2015 0713    Hgb A1C Lab Results  Component Value Date   HGBA1C 7.6* 11/14/2014        Assessment & Plan:   Dyshidrotic eczema:  eRx for Triamcinolone cream to the affected area Using a moisturizing hand lotion daily  Right knee pain:  Likely mild arthritis Advised him to take Aleve or Ibuprofen as needed He declines RX of right knee today  RTC as needed or if symptoms persist or worsen

## 2015-03-19 NOTE — Patient Instructions (Signed)
Knee Exercises EXERCISES RANGE OF MOTION (ROM) AND STRETCHING EXERCISES These exercises may help you when beginning to rehabilitate your injury. Your symptoms may resolve with or without further involvement from your physician, physical therapist, or athletic trainer. While completing these exercises, remember:   Restoring tissue flexibility helps normal motion to return to the joints. This allows healthier, less painful movement and activity.  An effective stretch should be held for at least 30 seconds.  A stretch should never be painful. You should only feel a gentle lengthening or release in the stretched tissue. STRETCH - Knee Extension, Prone  Lie on your stomach on a firm surface, such as a bed or countertop. Place your right / left knee and leg just beyond the edge of the surface. You may wish to place a towel under the far end of your right / left thigh for comfort.  Relax your leg muscles and allow gravity to straighten your knee. Your clinician may advise you to add an ankle weight if more resistance is helpful for you.  You should feel a stretch in the back of your right / left knee. Hold this position for __________ seconds. Repeat __________ times. Complete this stretch __________ times per day. * Your physician, physical therapist, or athletic trainer may ask you to add ankle weight to enhance your stretch.  RANGE OF MOTION - Knee Flexion, Active  Lie on your back with both knees straight. (If this causes back discomfort, bend your opposite knee, placing your foot flat on the floor.)  Slowly slide your heel back toward your buttocks until you feel a gentle stretch in the front of your knee or thigh.  Hold for __________ seconds. Slowly slide your heel back to the starting position. Repeat __________ times. Complete this exercise __________ times per day.  STRETCH - Quadriceps, Prone   Lie on your stomach on a firm surface, such as a bed or padded floor.  Bend your right /  left knee and grasp your ankle. If you are unable to reach your ankle or pant leg, use a belt around your foot to lengthen your reach.  Gently pull your heel toward your buttocks. Your knee should not slide out to the side. You should feel a stretch in the front of your thigh and/or knee.  Hold this position for __________ seconds. Repeat __________ times. Complete this stretch __________ times per day.  STRETCH - Hamstrings, Supine   Lie on your back. Loop a belt or towel over the ball of your right / left foot.  Straighten your right / left knee and slowly pull on the belt to raise your leg. Do not allow the right / left knee to bend. Keep your opposite leg flat on the floor.  Raise the leg until you feel a gentle stretch behind your right / left knee or thigh. Hold this position for __________ seconds. Repeat __________ times. Complete this stretch __________ times per day.  STRENGTHENING EXERCISES These exercises may help you when beginning to rehabilitate your injury. They may resolve your symptoms with or without further involvement from your physician, physical therapist, or athletic trainer. While completing these exercises, remember:   Muscles can gain both the endurance and the strength needed for everyday activities through controlled exercises.  Complete these exercises as instructed by your physician, physical therapist, or athletic trainer. Progress the resistance and repetitions only as guided.  You may experience muscle soreness or fatigue, but the pain or discomfort you are trying to eliminate   should never worsen during these exercises. If this pain does worsen, stop and make certain you are following the directions exactly. If the pain is still present after adjustments, discontinue the exercise until you can discuss the trouble with your clinician. STRENGTH - Quadriceps, Isometrics  Lie on your back with your right / left leg extended and your opposite knee  bent.  Gradually tense the muscles in the front of your right / left thigh. You should see either your knee cap slide up toward your hip or increased dimpling just above the knee. This motion will push the back of the knee down toward the floor/mat/bed on which you are lying.  Hold the muscle as tight as you can without increasing your pain for __________ seconds.  Relax the muscles slowly and completely in between each repetition. Repeat __________ times. Complete this exercise __________ times per day.  STRENGTH - Quadriceps, Short Arcs   Lie on your back. Place a __________ inch towel roll under your knee so that the knee slightly bends.  Raise only your lower leg by tightening the muscles in the front of your thigh. Do not allow your thigh to rise.  Hold this position for __________ seconds. Repeat __________ times. Complete this exercise __________ times per day.  OPTIONAL ANKLE WEIGHTS: Begin with ____________________, but DO NOT exceed ____________________. Increase in 1 pound/0.5 kilogram increments.  STRENGTH - Quadriceps, Straight Leg Raises  Quality counts! Watch for signs that the quadriceps muscle is working to insure you are strengthening the correct muscles and not "cheating" by substituting with healthier muscles.  Lay on your back with your right / left leg extended and your opposite knee bent.  Tense the muscles in the front of your right / left thigh. You should see either your knee cap slide up or increased dimpling just above the knee. Your thigh may even quiver.  Tighten these muscles even more and raise your leg 4 to 6 inches off the floor. Hold for __________ seconds.  Keeping these muscles tense, lower your leg.  Relax the muscles slowly and completely in between each repetition. Repeat __________ times. Complete this exercise __________ times per day.  STRENGTH - Hamstring, Curls  Lay on your stomach with your legs extended. (If you lay on a bed, your feet  may hang over the edge.)  Tighten the muscles in the back of your thigh to bend your right / left knee up to 90 degrees. Keep your hips flat on the bed/floor.  Hold this position for __________ seconds.  Slowly lower your leg back to the starting position. Repeat __________ times. Complete this exercise __________ times per day.  OPTIONAL ANKLE WEIGHTS: Begin with ____________________, but DO NOT exceed ____________________. Increase in 1 pound/0.5 kilogram increments.  STRENGTH - Quadriceps, Squats  Stand in a door frame so that your feet and knees are in line with the frame.  Use your hands for balance, not support, on the frame.  Slowly lower your weight, bending at the hips and knees. Keep your lower legs upright so that they are parallel with the door frame. Squat only within the range that does not increase your knee pain. Never let your hips drop below your knees.  Slowly return upright, pushing with your legs, not pulling with your hands. Repeat __________ times. Complete this exercise __________ times per day.  STRENGTH - Quadriceps, Wall Slides  Follow guidelines for form closely. Increased knee pain often results from poorly placed feet or knees.  Lean   against a smooth wall or door and walk your feet out 18-24 inches. Place your feet hip-width apart.  Slowly slide down the wall or door until your knees bend __________ degrees.* Keep your knees over your heels, not your toes, and in line with your hips, not falling to either side.  Hold for __________ seconds. Stand up to rest for __________ seconds in between each repetition. Repeat __________ times. Complete this exercise __________ times per day. * Your physician, physical therapist, or athletic trainer will alter this angle based on your symptoms and progress. Document Released: 04/23/2005 Document Revised: 10/24/2013 Document Reviewed: 09/21/2008 [employer] Patient Information 2015 [employer]. This information is not  intended to replace advice given to you by your health care provider. Make sure you discuss any questions you have with your health care provider.  

## 2015-05-16 ENCOUNTER — Other Ambulatory Visit: Payer: Self-pay | Admitting: Internal Medicine

## 2015-05-21 ENCOUNTER — Encounter: Payer: Self-pay | Admitting: Internal Medicine

## 2015-05-21 ENCOUNTER — Ambulatory Visit (INDEPENDENT_AMBULATORY_CARE_PROVIDER_SITE_OTHER): Payer: BC Managed Care – PPO | Admitting: Internal Medicine

## 2015-05-21 VITALS — BP 122/84 | HR 90 | Temp 98.7°F | Wt 175.0 lb

## 2015-05-21 DIAGNOSIS — E785 Hyperlipidemia, unspecified: Secondary | ICD-10-CM

## 2015-05-21 DIAGNOSIS — E119 Type 2 diabetes mellitus without complications: Secondary | ICD-10-CM | POA: Diagnosis not present

## 2015-05-21 LAB — COMPREHENSIVE METABOLIC PANEL
ALBUMIN: 4.4 g/dL (ref 3.5–5.2)
ALT: 54 U/L — AB (ref 0–53)
AST: 26 U/L (ref 0–37)
Alkaline Phosphatase: 107 U/L (ref 39–117)
BILIRUBIN TOTAL: 0.4 mg/dL (ref 0.2–1.2)
BUN: 10 mg/dL (ref 6–23)
CO2: 26 mEq/L (ref 19–32)
CREATININE: 0.87 mg/dL (ref 0.40–1.50)
Calcium: 9.5 mg/dL (ref 8.4–10.5)
Chloride: 100 mEq/L (ref 96–112)
GFR: 101.74 mL/min (ref >60.00)
GLUCOSE: 172 mg/dL — AB (ref 70–99)
POTASSIUM: 4.2 meq/L (ref 3.5–5.1)
SODIUM: 138 meq/L (ref 135–145)
TOTAL PROTEIN: 7.1 g/dL (ref 6.0–8.3)

## 2015-05-21 LAB — LIPID PANEL
CHOLESTEROL: 220 mg/dL — AB (ref 0–200)
HDL: 27.3 mg/dL — ABNORMAL LOW (ref >39.00)
NonHDL: 192.58
Total CHOL/HDL Ratio: 8
Triglycerides: 227 mg/dL — ABNORMAL HIGH (ref 0.0–149.0)
VLDL: 45.4 mg/dL — AB (ref 0.0–40.0)

## 2015-05-21 LAB — LDL CHOLESTEROL, DIRECT: LDL DIRECT: 165 mg/dL

## 2015-05-21 NOTE — Patient Instructions (Signed)
Diabetes Mellitus and Food It is important for you to manage your blood sugar (glucose) level. Your blood glucose level can be greatly affected by what you eat. Eating healthier foods in the appropriate amounts throughout the day at about the same time each day will help you control your blood glucose level. It can also help slow or prevent worsening of your diabetes mellitus. Healthy eating may even help you improve the level of your blood pressure and reach or maintain a healthy weight.  General recommendations for healthful eating and cooking habits include:  Eating meals and snacks regularly. Avoid going long periods of time without eating to lose weight.  Eating a diet that consists mainly of plant-based foods, such as fruits, vegetables, nuts, legumes, and whole grains.  Using low-heat cooking methods, such as baking, instead of high-heat cooking methods, such as deep frying. Work with your dietitian to make sure you understand how to use the Nutrition Facts information on food labels. HOW CAN FOOD AFFECT ME? Carbohydrates Carbohydrates affect your blood glucose level more than any other type of food. Your dietitian will help you determine how many carbohydrates to eat at each meal and teach you how to count carbohydrates. Counting carbohydrates is important to keep your blood glucose at a healthy level, especially if you are using insulin or taking certain medicines for diabetes mellitus. Alcohol Alcohol can cause sudden decreases in blood glucose (hypoglycemia), especially if you use insulin or take certain medicines for diabetes mellitus. Hypoglycemia can be a life-threatening condition. Symptoms of hypoglycemia (sleepiness, dizziness, and disorientation) are similar to symptoms of having too much alcohol.  If your health care provider has given you approval to drink alcohol, do so in moderation and use the following guidelines:  Women should not have more than one drink per day, and men  should not have more than two drinks per day. One drink is equal to:  12 oz of beer.  5 oz of wine.  1 oz of hard liquor.  Do not drink on an empty stomach.  Keep yourself hydrated. Have water, diet soda, or unsweetened iced tea.  Regular soda, juice, and other mixers might contain a lot of carbohydrates and should be counted. WHAT FOODS ARE NOT RECOMMENDED? As you make food choices, it is important to remember that all foods are not the same. Some foods have fewer nutrients per serving than other foods, even though they might have the same number of calories or carbohydrates. It is difficult to get your body what it needs when you eat foods with fewer nutrients. Examples of foods that you should avoid that are high in calories and carbohydrates but low in nutrients include:  Trans fats (most processed foods list trans fats on the Nutrition Facts label).  Regular soda.  Juice.  Candy.  Sweets, such as cake, pie, doughnuts, and cookies.  Fried foods. WHAT FOODS CAN I EAT? Eat nutrient-rich foods, which will nourish your body and keep you healthy. The food you should eat also will depend on several factors, including:  The calories you need.  The medicines you take.  Your weight.  Your blood glucose level.  Your blood pressure level.  Your cholesterol level. You should eat a variety of foods, including:  Protein.  Lean cuts of meat.  Proteins low in saturated fats, such as fish, egg whites, and beans. Avoid processed meats.  Fruits and vegetables.  Fruits and vegetables that may help control blood glucose levels, such as apples, mangoes, and   yams.  Dairy products.  Choose fat-free or low-fat dairy products, such as milk, yogurt, and cheese.  Grains, bread, pasta, and rice.  Choose whole grain products, such as multigrain bread, whole oats, and brown rice. These foods may help control blood pressure.  Fats.  Foods containing healthful fats, such as nuts,  avocado, olive oil, canola oil, and fish. DOES EVERYONE WITH DIABETES MELLITUS HAVE THE SAME MEAL PLAN? Because every person with diabetes mellitus is different, there is not one meal plan that works for everyone. It is very important that you meet with a dietitian who will help you create a meal plan that is just right for you.   This information is not intended to replace advice given to you by your health care provider. Make sure you discuss any questions you have with your health care provider.   Document Released: 03/06/2005 Document Revised: 06/30/2014 Document Reviewed: 05/06/2013 Elsevier Interactive Patient Education 2016 [employer]  

## 2015-05-21 NOTE — Addendum Note (Signed)
Addended by: Marchia Bond on: 05/21/2015 04:10 PM   Modules accepted: Miquel Dunn

## 2015-05-21 NOTE — Progress Notes (Addendum)
Subjective:    Patient ID: Alan Wise, male    DOB: 1971-05-18, 44 y.o.   MRN: [id number]  HPI  Pt presents to the clinic today for 6 month follow up of chronic conditions.  HLD: His last LDL was 156, Triglycerides 225. He does try to consume a low fat diet but reports he has not been doing a good job over the last 3 months.  DM 2: His last A1C was 7.6%. He is checking his fasting sugars. They range 165-175. He is on Metformin 1000 mg daily (BID dosing caused diarrhea). Flu never. Pneumovax never. His last eye exam was > 1 year ago. He had a Microalbumin done 07/2014 which did show mild proteinuria. He has not been great about his diet.  Review of Systems      Past Medical History  Diagnosis Date  . History of kidney stones   . Allergy   . Diabetes mellitus ([place])   . Hyperlipidemia     Current Outpatient Prescriptions  Medication Sig Dispense Refill  . glucose blood (FREESTYLE LITE) test strip 1 each by Other route 2 (two) times daily as needed for other. 50 each 12  . metFORMIN (GLUCOPHAGE) 1000 MG tablet TAKE 1 TABLET (1,000 MG TOTAL) BY MOUTH DAILY WITH BREAKFAST. 90 tablet 0  . oxyCODONE-acetaminophen (ROXICET) 5-325 MG per tablet Take 1 tablet by mouth every 6 (six) hours as needed. 20 tablet 0  . triamcinolone ointment (KENALOG) 0.5 % Apply 1 application topically 2 (two) times daily. 30 g 0   No current facility-administered medications for this visit.    No Known Allergies  Family History  Problem Relation Age of Onset  . Hyperlipidemia Father   . Hypertension Father   . Diabetes Father   . Stroke Maternal Grandmother   . Diabetes Maternal Grandmother   . Stroke Paternal Grandmother   . Hypertension Paternal Grandmother   . Kidney disease Neg Hx   . Prostate cancer Neg Hx   . Benign prostatic hyperplasia Father     Social History   Social History  . Marital Status: Single    Spouse Name: N/A  . Number of Children: N/A  . Years of Education: N/A    Occupational History  . Not on file.   Social History Main Topics  . Smoking status: Never Smoker   . Smokeless tobacco: Never Used  . Alcohol Use: 0.0 oz/week    0 Standard drinks or equivalent per week     Comment: rare  . Drug Use: No  . Sexual Activity: Not on file   Other Topics Concern  . Not on file   Social History Narrative     Constitutional: Denies fever, malaise, fatigue, headache or abrupt weight changes.  HEENT: Denies eye pain, eye redness, ear pain, ringing in the ears, wax buildup, runny nose, nasal congestion, bloody nose, or sore throat. Respiratory: Denies difficulty breathing, shortness of breath, cough or sputum production.   Cardiovascular: Denies chest pain, chest tightness, palpitations or swelling in the hands or feet.  Gastrointestinal: Denies abdominal pain, bloating, constipation, diarrhea or blood in the stool.  GU: Denies urgency, frequency, pain with urination, burning sensation, blood in urine, odor or discharge. Skin: Denies redness, rashes, lesions or ulcercations.  Neurological: Denies dizziness, difficulty with memory, difficulty with speech or problems with balance and coordination.    No other specific complaints in a complete review of systems (except as listed in HPI above).  Objective:   Physical Exam  BP 122/84 mmHg  Pulse 90  Temp(Src) 98.7 F (37.1 C) (Oral)  Wt 175 lb (79.379 kg)  SpO2 98% Wt Readings from Last 3 Encounters:  05/21/15 175 lb (79.379 kg)  03/19/15 176 lb 4 oz (79.946 kg)  02/04/15 173 lb (78.472 kg)    General: Appears his stated age, well developed, well nourished in NAD. Skin: Warm, dry and intact. No rashes, lesions or ulcerations noted. HEENT: Head: normal shape and size; Eyes: sclera white, no icterus, conjunctiva pink, PERRLA and EOMs intact;  Cardiovascular: Normal rate and rhythm. S1,S2 noted.  No murmur, rubs or gallops noted. . No carotid bruits noted. Pulmonary/Chest: Normal effort and  positive vesicular breath sounds. No respiratory distress. No wheezes, rales or ronchi noted.  Abdomen: Soft and nontender.  Neurological: Alert and oriented. Sensation intact to BLE.   BMET    Component Value Date/Time   NA 135 02/04/2015 0224   NA 138 05/04/2012 1815   K 3.5 02/04/2015 0224   K 3.5 05/04/2012 1815   CL 98* 02/04/2015 0224   CL 99 05/04/2012 1815   CO2 24 02/04/2015 0224   CO2 29 05/04/2012 1815   GLUCOSE 191* 02/04/2015 0224   GLUCOSE 69 05/04/2012 1815   BUN 14 02/04/2015 0224   BUN 12 05/04/2012 1815   CREATININE 0.94 02/04/2015 0224   CREATININE 1.06 05/04/2012 1815   CALCIUM 9.7 02/04/2015 0224   CALCIUM 9.2 05/04/2012 1815   GFRNONAA >60 02/04/2015 0224   GFRNONAA >60 05/04/2012 1815   GFRAA >60 02/04/2015 0224   GFRAA >60 05/04/2012 1815    Lipid Panel     Component Value Date/Time   CHOL 207* 11/14/2014 1008   TRIG 225.0* 11/14/2014 1008   HDL 26.80* 11/14/2014 1008   CHOLHDL 8 11/14/2014 1008   VLDL 45.0* 11/14/2014 1008    CBC    Component Value Date/Time   WBC 13.6* 02/04/2015 0224   WBC 9.7 05/04/2012 1815   RBC 5.49 02/04/2015 0224   RBC 5.63 05/04/2012 1815   HGB 16.0 02/04/2015 0224   HGB 16.4 05/04/2012 1815   HCT 46.2 02/04/2015 0224   HCT 48.4 05/04/2012 1815   PLT 245 02/04/2015 0224   PLT 261 05/04/2012 1815   MCV 84.1 02/04/2015 0224   MCV 86 05/04/2012 1815   MCH 29.0 02/04/2015 0224   MCH 29.1 05/04/2012 1815   MCHC 34.5 02/04/2015 0224   MCHC 33.8 05/04/2012 1815   RDW 12.6 02/04/2015 0224   RDW 12.7 05/04/2012 1815   LYMPHSABS 2.4 01/18/2015 0713   MONOABS 0.5 01/18/2015 0713   EOSABS 0.2 01/18/2015 0713   BASOSABS 0.1 01/18/2015 0713    Hgb A1C Lab Results  Component Value Date   HGBA1C 7.6* 11/14/2014         Assessment & Plan:

## 2015-05-21 NOTE — Assessment & Plan Note (Signed)
Will check LDL today Advised him the goal should be < 100 Encouraged him to consume a low fat

## 2015-05-21 NOTE — Progress Notes (Signed)
Pre visit review using our clinic review tool, if applicable. No additional management support is needed unless otherwise documented below in the visit note. 

## 2015-05-21 NOTE — Addendum Note (Signed)
Addended by: Jearld Fenton on: 05/21/2015 10:31 AM   Modules accepted: Miquel Dunn

## 2015-05-21 NOTE — Assessment & Plan Note (Signed)
A1C goal < 7% Encouraged him to consume a low carb diet He declines flu and pneumovax He declines referral to opthalmology for an eye exam- he declines Foot exam today Will check A1C today Continue Metformin unless instructed otherwise

## 2015-05-23 ENCOUNTER — Other Ambulatory Visit (INDEPENDENT_AMBULATORY_CARE_PROVIDER_SITE_OTHER): Payer: BC Managed Care – PPO

## 2015-05-23 DIAGNOSIS — E119 Type 2 diabetes mellitus without complications: Secondary | ICD-10-CM

## 2015-05-23 LAB — HEMOGLOBIN A1C: Hgb A1c MFr Bld: 8.6 % — ABNORMAL HIGH (ref 4.6–6.5)

## 2015-05-23 NOTE — Addendum Note (Signed)
Addended by: Lurlean Nanny on: 05/23/2015 09:29 AM   Modules accepted: Orders

## 2015-08-15 ENCOUNTER — Other Ambulatory Visit: Payer: Self-pay | Admitting: Internal Medicine

## 2015-08-24 NOTE — Telephone Encounter (Signed)
erroneous

## 2015-10-30 IMAGING — US US RENAL
1 series · 14 of 25 positions shown · non-contrast
Comparison: [DATE]

CLINICAL DATA: Left-sided flank pain

EXAM:
RENAL / URINARY TRACT ULTRASOUND COMPLETE

[Series 1: us renal · 0.26mm/px · 14 of 30 slices shown]
[im 1/30]
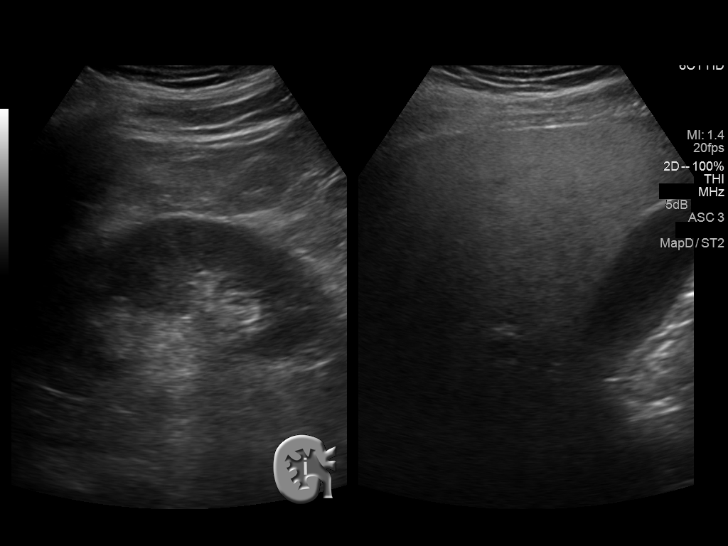
[im 3/30]
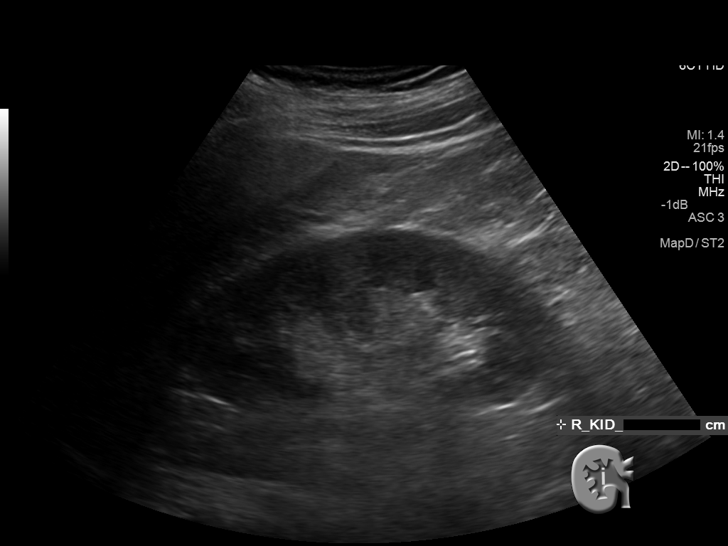
[im 5/30]
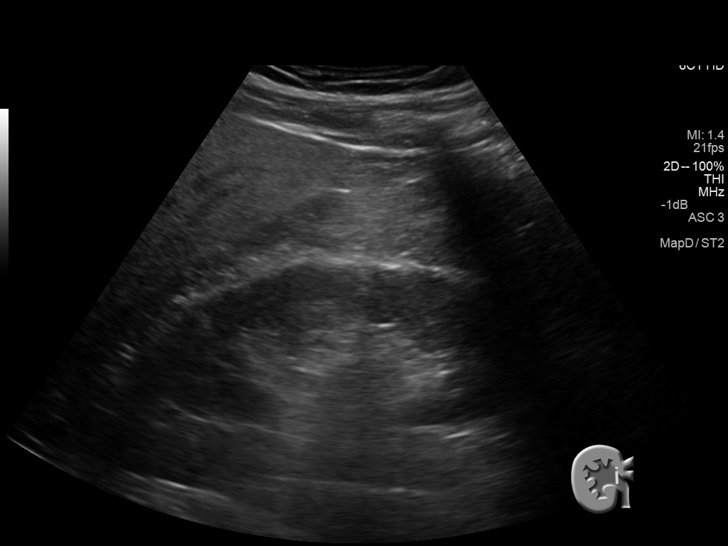
[im 8/30]
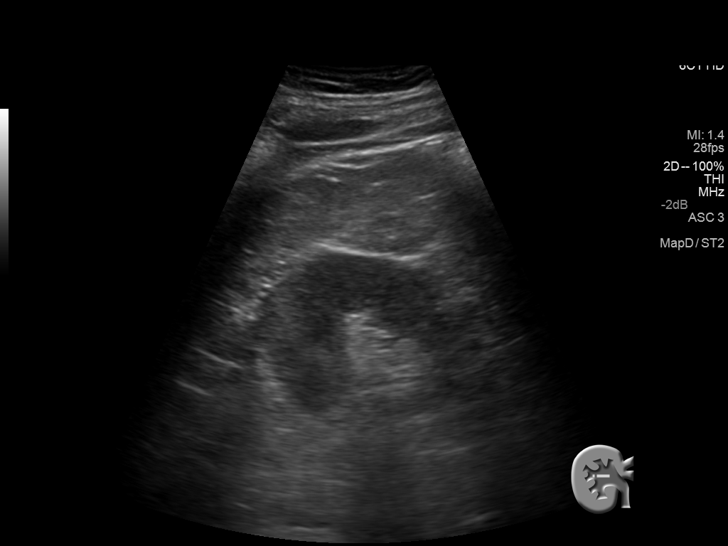
[im 10/30]
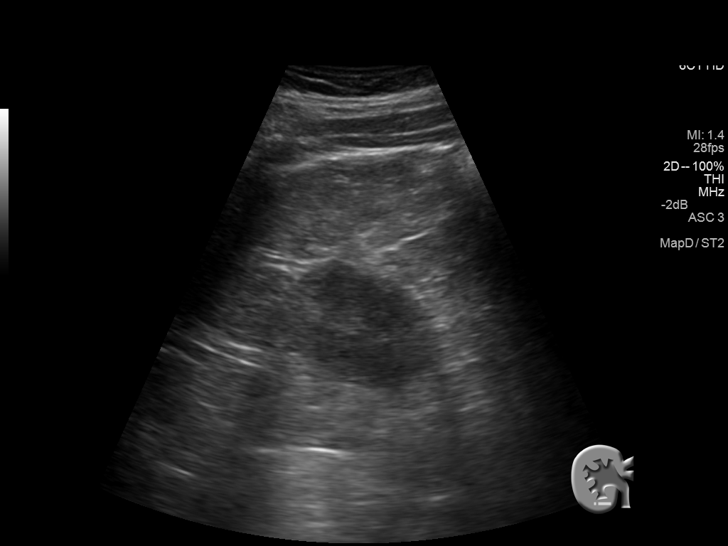
[im 11/30]
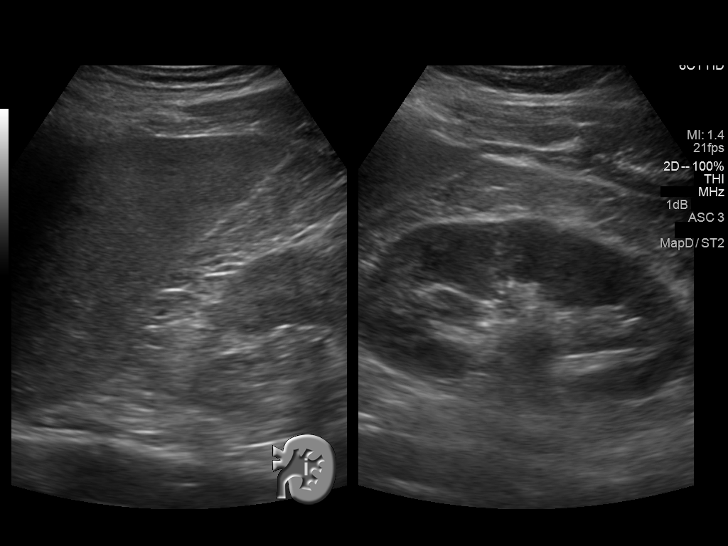
[im 14/30]
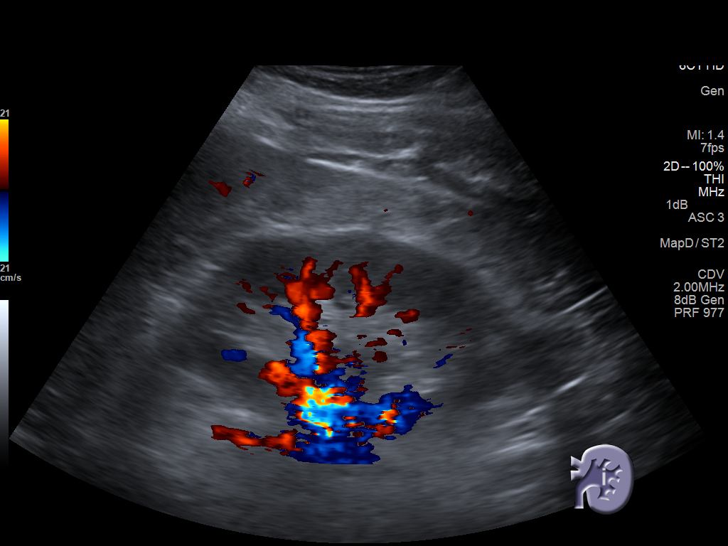
[im 16/30]
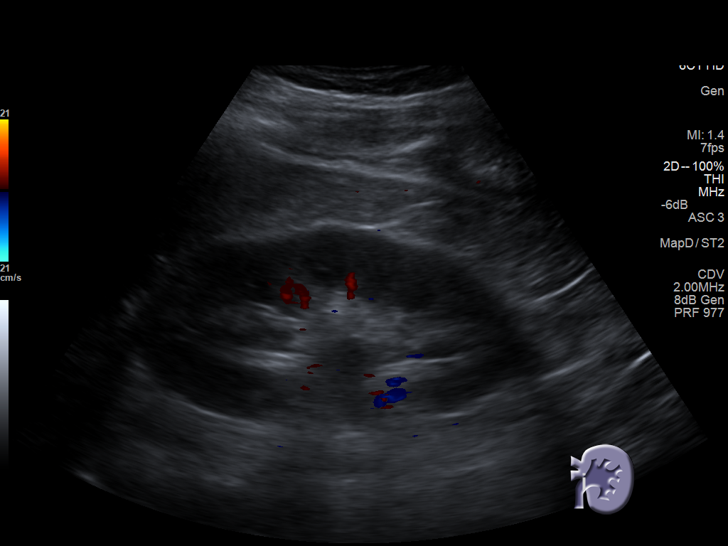
[im 19/30]
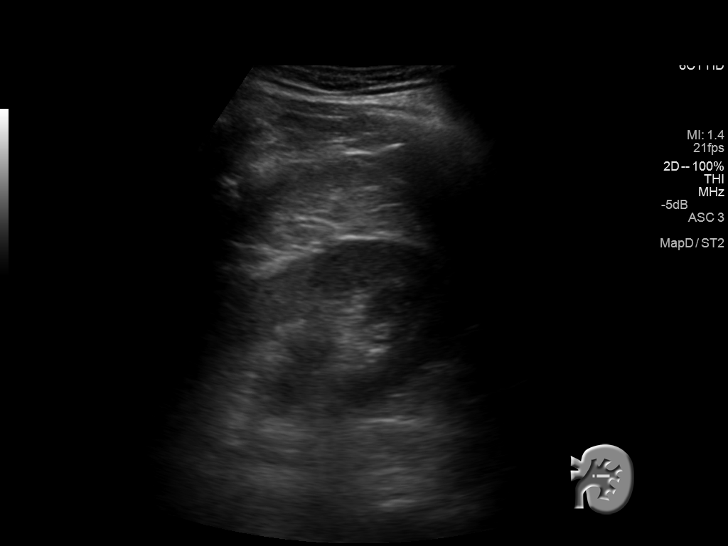
[im 20/30]
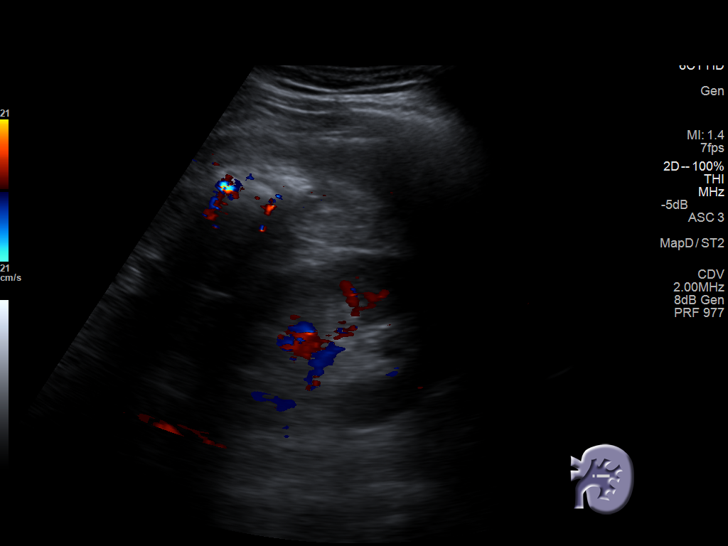
[im 22/30]
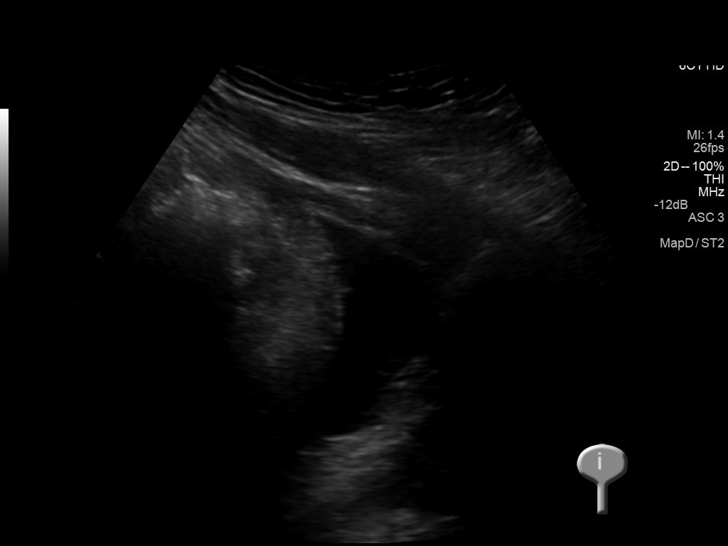
[im 25/30]
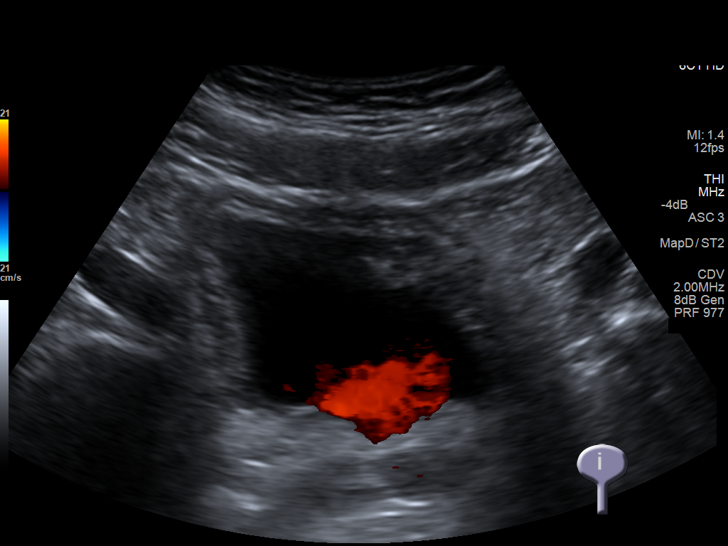
[im 27/30]
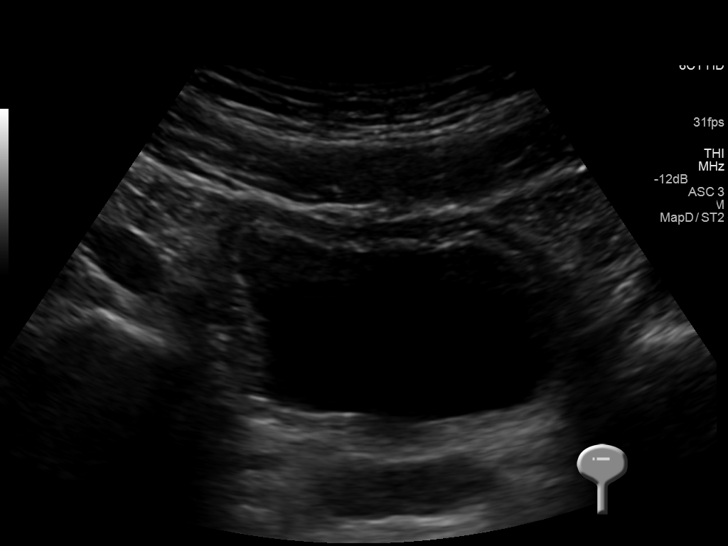
[im 30/30]
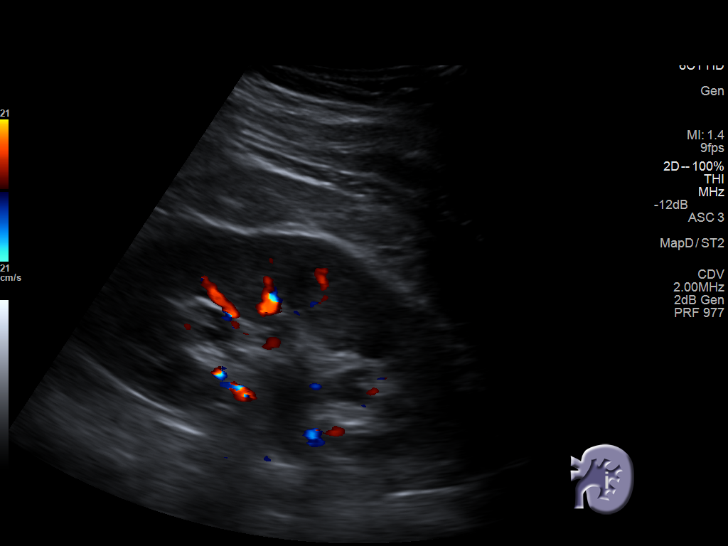

[14 of 25 positions shown; findings below may reference images not displayed]

FINDINGS: Right Kidney:

Length: 12.4 cm.. Echogenicity within normal limits. No mass or
hydronephrosis visualized.

Left Kidney:

Length: 12.4 cm.. Mild hydronephrosis is noted although a ureteral
jet is seen distally.

Bladder:

Incompletely distended
IMPRESSION: Mild left hydronephrosis. This persists following voiding. It would
be difficult to exclude a partially obstructing stone on the basis
of this exam.

## 2015-11-05 ENCOUNTER — Other Ambulatory Visit: Payer: Self-pay | Admitting: Internal Medicine

## 2015-11-05 DIAGNOSIS — E119 Type 2 diabetes mellitus without complications: Secondary | ICD-10-CM

## 2015-11-05 DIAGNOSIS — E785 Hyperlipidemia, unspecified: Secondary | ICD-10-CM

## 2015-11-12 ENCOUNTER — Other Ambulatory Visit (INDEPENDENT_AMBULATORY_CARE_PROVIDER_SITE_OTHER): Payer: BC Managed Care – PPO

## 2015-11-12 DIAGNOSIS — Z1159 Encounter for screening for other viral diseases: Secondary | ICD-10-CM

## 2015-11-12 DIAGNOSIS — E119 Type 2 diabetes mellitus without complications: Secondary | ICD-10-CM

## 2015-11-12 DIAGNOSIS — E785 Hyperlipidemia, unspecified: Secondary | ICD-10-CM

## 2015-11-12 LAB — LIPID PANEL
Cholesterol: 234 mg/dL — ABNORMAL HIGH (ref 0–200)
HDL: 27.9 mg/dL — ABNORMAL LOW (ref >39.00)
NONHDL: 206.49
Total CHOL/HDL Ratio: 8
Triglycerides: 231 mg/dL — ABNORMAL HIGH (ref 0.0–149.0)
VLDL: 46.2 mg/dL — ABNORMAL HIGH (ref 0.0–40.0)

## 2015-11-12 LAB — COMPREHENSIVE METABOLIC PANEL
ALT: 62 U/L — AB (ref 0–53)
AST: 31 U/L (ref 0–37)
Albumin: 4.9 g/dL (ref 3.5–5.2)
Alkaline Phosphatase: 104 U/L (ref 39–117)
BILIRUBIN TOTAL: 0.7 mg/dL (ref 0.2–1.2)
BUN: 10 mg/dL (ref 6–23)
CO2: 28 meq/L (ref 19–32)
CREATININE: 0.84 mg/dL (ref 0.40–1.50)
Calcium: 10 mg/dL (ref 8.4–10.5)
Chloride: 99 mEq/L (ref 96–112)
GFR: 105.7 mL/min (ref >60.00)
Glucose, Bld: 163 mg/dL — ABNORMAL HIGH (ref 70–99)
Potassium: 4.2 mEq/L (ref 3.5–5.1)
SODIUM: 136 meq/L (ref 135–145)
Total Protein: 7.5 g/dL (ref 6.0–8.3)

## 2015-11-12 LAB — LDL CHOLESTEROL, DIRECT: Direct LDL: 168 mg/dL

## 2015-11-12 LAB — HEMOGLOBIN A1C: Hgb A1c MFr Bld: 10.3 % — ABNORMAL HIGH (ref 4.6–6.5)

## 2015-11-12 NOTE — Addendum Note (Signed)
Addended by: Ellamae Sia on: 11/12/2015 04:27 PM   Modules accepted: Orders

## 2015-11-13 LAB — HEPATITIS C ANTIBODY: HCV AB: NEGATIVE

## 2015-11-15 ENCOUNTER — Other Ambulatory Visit: Payer: BC Managed Care – PPO

## 2015-11-20 ENCOUNTER — Encounter: Payer: Self-pay | Admitting: Internal Medicine

## 2015-11-20 ENCOUNTER — Ambulatory Visit (INDEPENDENT_AMBULATORY_CARE_PROVIDER_SITE_OTHER): Payer: BC Managed Care – PPO | Admitting: Internal Medicine

## 2015-11-20 VITALS — BP 120/84 | HR 76 | Temp 98.1°F | Wt 169.5 lb

## 2015-11-20 DIAGNOSIS — E785 Hyperlipidemia, unspecified: Secondary | ICD-10-CM

## 2015-11-20 DIAGNOSIS — E1165 Type 2 diabetes mellitus with hyperglycemia: Secondary | ICD-10-CM | POA: Diagnosis not present

## 2015-11-20 NOTE — Progress Notes (Signed)
Pre visit review using our clinic review tool, if applicable. No additional management support is needed unless otherwise documented below in the visit note. 

## 2015-11-20 NOTE — Progress Notes (Signed)
Subjective:    Patient ID: Alan Wise, male    DOB: [birth year], 45 y.o.   MRN: [id number]  HPI  Pt presents to the clinic today for 6 month follow up of chronic conditions.  HLD: His last LDL was 168, Triglycerides 231. He has not been trying to consume a low fat diet.  DM 2: His last A1C was 10.3%. He is checking his fasting sugars. They range 160's. He is on Metformin 1000 mg daily but reports he has only taken 1-2 months out of the last 6 months due to diarrhea. Flu never. Pneumovax never. His last eye exam was > 1 year ago. He had a microalbumin done 07/2014 which did show mild proteinuria, but he declined to start medication for renal protection. He has not been great about his diet.  Review of Systems      Past Medical History  Diagnosis Date  . History of kidney stones   . Allergy   . Diabetes mellitus ([place])   . Hyperlipidemia     Current Outpatient Prescriptions  Medication Sig Dispense Refill  . glucose blood (FREESTYLE LITE) test strip 1 each by Other route 2 (two) times daily as needed for other. 50 each 12  . metFORMIN (GLUCOPHAGE) 1000 MG tablet TAKE 1 TABLET (1,000 MG TOTAL) BY MOUTH DAILY WITH BREAKFAST. 90 tablet 1  . triamcinolone ointment (KENALOG) 0.5 % Apply 1 application topically 2 (two) times daily. 30 g 0   No current facility-administered medications for this visit.    No Known Allergies  Family History  Problem Relation Age of Onset  . Hyperlipidemia Father   . Hypertension Father   . Diabetes Father   . Stroke Maternal Grandmother   . Diabetes Maternal Grandmother   . Stroke Paternal Grandmother   . Hypertension Paternal Grandmother   . Kidney disease Neg Hx   . Prostate cancer Neg Hx   . Benign prostatic hyperplasia Father     Social History   Social History  . Marital Status: Single    Spouse Name: N/A  . Number of Children: N/A  . Years of Education: N/A   Occupational History  . Not on file.   Social History Main  Topics  . Smoking status: Never Smoker   . Smokeless tobacco: Never Used  . Alcohol Use: 0.0 oz/week    0 Standard drinks or equivalent per week     Comment: rare  . Drug Use: No  . Sexual Activity: Not on file   Other Topics Concern  . Not on file   Social History Narrative     Constitutional: Denies fever, malaise, fatigue, headache or abrupt weight changes.  HEENT: Denies eye pain, eye redness, ear pain, ringing in the ears, wax buildup, runny nose, nasal congestion, bloody nose, or sore throat. Respiratory: Denies difficulty breathing, shortness of breath, cough or sputum production.   Cardiovascular: Denies chest pain, chest tightness, palpitations or swelling in the hands or feet.  Gastrointestinal: Denies abdominal pain, bloating, constipation, diarrhea or blood in the stool.  GU: Denies urgency, frequency, pain with urination, burning sensation, blood in urine, odor or discharge. Skin: Denies redness, rashes, lesions or ulcercations.  Neurological: Denies dizziness, difficulty with memory, difficulty with speech or problems with balance and coordination.    No other specific complaints in a complete review of systems (except as listed in HPI above).  Objective:   Physical Exam BP 120/84 mmHg  Pulse 76  Temp(Src) 98.1 F (36.7 C) (Oral)  Wt 169 lb 8 oz (76.885 kg)  SpO2 97%  Wt Readings from Last 3 Encounters:  05/21/15 175 lb (79.379 kg)  03/19/15 176 lb 4 oz (79.946 kg)  02/04/15 173 lb (78.472 kg)    General: Appears his stated age, well developed, well nourished in NAD. Skin: Warm, dry and intact. No rashes, lesions or ulcerations noted. HEENT: Head: normal shape and size; Eyes: sclera white, no icterus, conjunctiva pink, PERRLA and EOMs intact;  Cardiovascular: Normal rate and rhythm. S1,S2 noted.  No murmur, rubs or gallops noted. . No carotid bruits noted. Pulmonary/Chest: Normal effort and positive vesicular breath sounds. No respiratory distress. No  wheezes, rales or ronchi noted.  Abdomen: Soft and nontender.  Neurological: Alert and oriented. Sensation intact to BLE.   BMET    Component Value Date/Time   NA 136 11/12/2015 1207   NA 138 05/04/2012 1815   K 4.2 11/12/2015 1207   K 3.5 05/04/2012 1815   CL 99 11/12/2015 1207   CL 99 05/04/2012 1815   CO2 28 11/12/2015 1207   CO2 29 05/04/2012 1815   GLUCOSE 163* 11/12/2015 1207   GLUCOSE 69 05/04/2012 1815   BUN 10 11/12/2015 1207   BUN 12 05/04/2012 1815   CREATININE 0.84 11/12/2015 1207   CREATININE 1.06 05/04/2012 1815   CALCIUM 10.0 11/12/2015 1207   CALCIUM 9.2 05/04/2012 1815   GFRNONAA >60 02/04/2015 0224   GFRNONAA >60 05/04/2012 1815   GFRAA >60 02/04/2015 0224   GFRAA >60 05/04/2012 1815    Lipid Panel     Component Value Date/Time   CHOL 234* 11/12/2015 1207   TRIG 231.0* 11/12/2015 1207   HDL 27.90* 11/12/2015 1207   CHOLHDL 8 11/12/2015 1207   VLDL 46.2* 11/12/2015 1207    CBC    Component Value Date/Time   WBC 13.6* 02/04/2015 0224   WBC 9.7 05/04/2012 1815   RBC 5.49 02/04/2015 0224   RBC 5.63 05/04/2012 1815   HGB 16.0 02/04/2015 0224   HGB 16.4 05/04/2012 1815   HCT 46.2 02/04/2015 0224   HCT 48.4 05/04/2012 1815   PLT 245 02/04/2015 0224   PLT 261 05/04/2012 1815   MCV 84.1 02/04/2015 0224   MCV 86 05/04/2012 1815   MCH 29.0 02/04/2015 0224   MCH 29.1 05/04/2012 1815   MCHC 34.5 02/04/2015 0224   MCHC 33.8 05/04/2012 1815   RDW 12.6 02/04/2015 0224   RDW 12.7 05/04/2012 1815   LYMPHSABS 2.4 01/18/2015 0713   MONOABS 0.5 01/18/2015 0713   EOSABS 0.2 01/18/2015 0713   BASOSABS 0.1 01/18/2015 0713    Hgb A1C Lab Results  Component Value Date   HGBA1C 10.3* 11/12/2015         Assessment & Plan:

## 2015-11-20 NOTE — Assessment & Plan Note (Signed)
Noncompliant Discussed importance of medication adherance If Metformin causing diarrhea, he needs to let me know, will switch to Glipizide Foot exam today Advised him to make an appt for an eye exam- he declines He does not want to start low dose Lisinopril for renal protection Will repeat A1C and microalbumin in 3 months- lab only  If he continues to be noncompliant, will refer to endocrinology

## 2015-11-20 NOTE — Assessment & Plan Note (Signed)
Pt declines cholesterol lowering medication Discussed risks including heart attack and stroke- he understands the risk Encouraged him to consume a low fat diet

## 2015-11-20 NOTE — Patient Instructions (Signed)

## 2015-11-23 ENCOUNTER — Other Ambulatory Visit: Payer: Self-pay | Admitting: Internal Medicine

## 2016-01-24 ENCOUNTER — Encounter: Payer: Self-pay | Admitting: Emergency Medicine

## 2016-01-24 ENCOUNTER — Emergency Department: Payer: BC Managed Care – PPO

## 2016-01-24 ENCOUNTER — Observation Stay
Admission: EM | Admit: 2016-01-24 | Discharge: 2016-01-25 | Disposition: A | Discharge status: Home / Self Care | Payer: BC Managed Care – PPO | Attending: Internal Medicine | Admitting: Internal Medicine

## 2016-01-24 DIAGNOSIS — E785 Hyperlipidemia, unspecified: Secondary | ICD-10-CM | POA: Diagnosis not present

## 2016-01-24 DIAGNOSIS — Z87442 Personal history of urinary calculi: Secondary | ICD-10-CM | POA: Insufficient documentation

## 2016-01-24 DIAGNOSIS — N35919 Unspecified urethral stricture, male, unspecified site: Secondary | ICD-10-CM | POA: Diagnosis present

## 2016-01-24 DIAGNOSIS — N200 Calculus of kidney: Secondary | ICD-10-CM

## 2016-01-24 DIAGNOSIS — Z79899 Other long term (current) drug therapy: Secondary | ICD-10-CM | POA: Insufficient documentation

## 2016-01-24 DIAGNOSIS — N132 Hydronephrosis with renal and ureteral calculous obstruction: Secondary | ICD-10-CM | POA: Diagnosis not present

## 2016-01-24 DIAGNOSIS — Z8249 Family history of ischemic heart disease and other diseases of the circulatory system: Secondary | ICD-10-CM | POA: Insufficient documentation

## 2016-01-24 DIAGNOSIS — Z833 Family history of diabetes mellitus: Secondary | ICD-10-CM | POA: Insufficient documentation

## 2016-01-24 DIAGNOSIS — E1169 Type 2 diabetes mellitus with other specified complication: Secondary | ICD-10-CM | POA: Diagnosis present

## 2016-01-24 DIAGNOSIS — Z794 Long term (current) use of insulin: Secondary | ICD-10-CM | POA: Diagnosis not present

## 2016-01-24 DIAGNOSIS — K760 Fatty (change of) liver, not elsewhere classified: Secondary | ICD-10-CM | POA: Diagnosis not present

## 2016-01-24 DIAGNOSIS — Z85828 Personal history of other malignant neoplasm of skin: Secondary | ICD-10-CM | POA: Diagnosis not present

## 2016-01-24 DIAGNOSIS — E1165 Type 2 diabetes mellitus with hyperglycemia: Secondary | ICD-10-CM | POA: Diagnosis present

## 2016-01-24 DIAGNOSIS — Z823 Family history of stroke: Secondary | ICD-10-CM | POA: Diagnosis not present

## 2016-01-24 DIAGNOSIS — N359 Urethral stricture, unspecified: Secondary | ICD-10-CM | POA: Diagnosis not present

## 2016-01-24 HISTORY — DX: Disorder of kidney and ureter, unspecified: N28.9

## 2016-01-24 LAB — URINALYSIS COMPLETE WITH MICROSCOPIC (ARMC ONLY)
BACTERIA UA: NONE SEEN
Bilirubin Urine: NEGATIVE
GLUCOSE, UA: 150 mg/dL — AB
Ketones, ur: NEGATIVE mg/dL
Leukocytes, UA: NEGATIVE
Nitrite: NEGATIVE
PROTEIN: 30 mg/dL — AB
Specific Gravity, Urine: 1.01 (ref 1.005–1.030)
pH: 6 (ref 5.0–8.0)

## 2016-01-24 LAB — BASIC METABOLIC PANEL
ANION GAP: 15 (ref 5–15)
BUN: 17 mg/dL (ref 6–20)
CALCIUM: 9.6 mg/dL (ref 8.9–10.3)
CO2: 24 mmol/L (ref 22–32)
Chloride: 99 mmol/L — ABNORMAL LOW (ref 101–111)
Creatinine, Ser: 1.08 mg/dL (ref 0.61–1.24)
GFR calc non Af Amer: >60 mL/min (ref >60)
Glucose, Bld: 190 mg/dL — ABNORMAL HIGH (ref 65–99)
POTASSIUM: 3.7 mmol/L (ref 3.5–5.1)
Sodium: 138 mmol/L (ref 135–145)

## 2016-01-24 LAB — CBC
HEMATOCRIT: 49.4 % (ref 40.0–52.0)
HEMOGLOBIN: 17.2 g/dL (ref 13.0–18.0)
MCH: 29.5 pg (ref 26.0–34.0)
MCHC: 34.9 g/dL (ref 32.0–36.0)
MCV: 84.3 fL (ref 80.0–100.0)
Platelets: 295 10*3/uL (ref 150–440)
RBC: 5.85 MIL/uL (ref 4.40–5.90)
RDW: 12.9 % (ref 11.5–14.5)
WBC: 13.2 10*3/uL — AB (ref 3.8–10.6)

## 2016-01-24 IMAGING — CT CT RENAL STONE PROTOCOL
3 of 4 series · 8 of 46 positions shown, 15 images · non-contrast
Comparison: [DATE]

CLINICAL DATA: Right flank pain for the last 90 minutes. Vomiting.
History of kidney stones.

EXAM:
CT ABDOMEN AND PELVIS WITHOUT CONTRAST
TECHNIQUE: Multidetector CT imaging of the abdomen and pelvis was performed
following the standard protocol without IV contrast.

[Series 4: lung · axial · 0.78mm/px · z∈[-620,-540]mm · 4 of 28 slices shown, 9 images]
[im 6/28  soft-tissue]
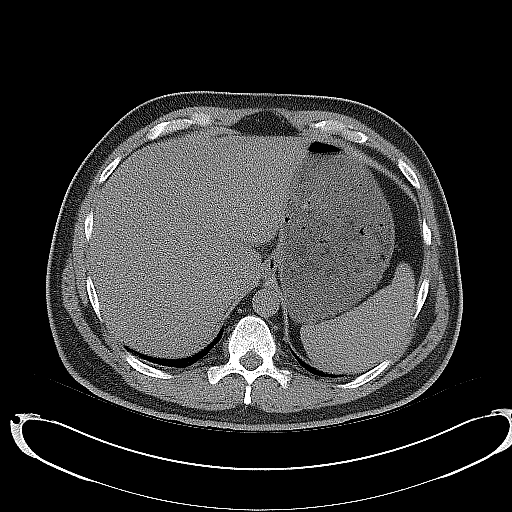
[im 6/28  lung]
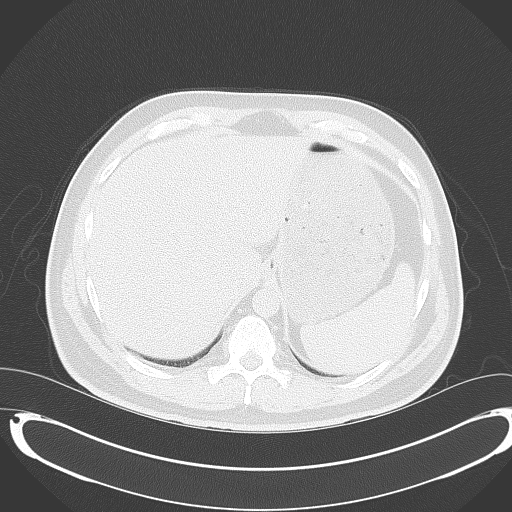
[im 6/28  bone]
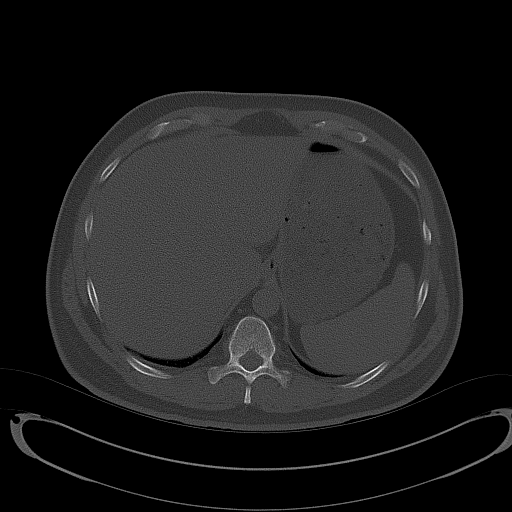
[im 11/28  soft-tissue]
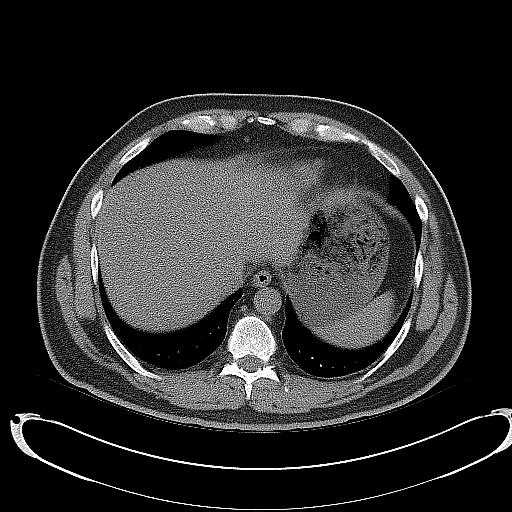
[im 11/28  lung]
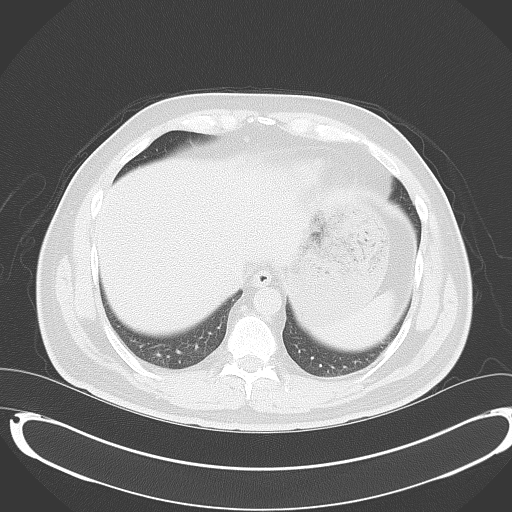
[im 17/28  soft-tissue]
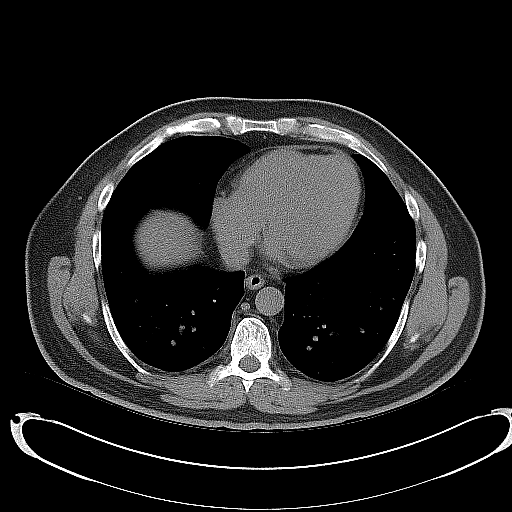
[im 17/28  lung]
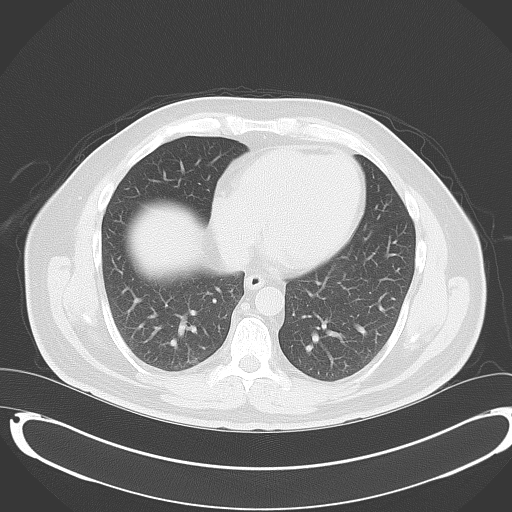
[im 22/28  soft-tissue]
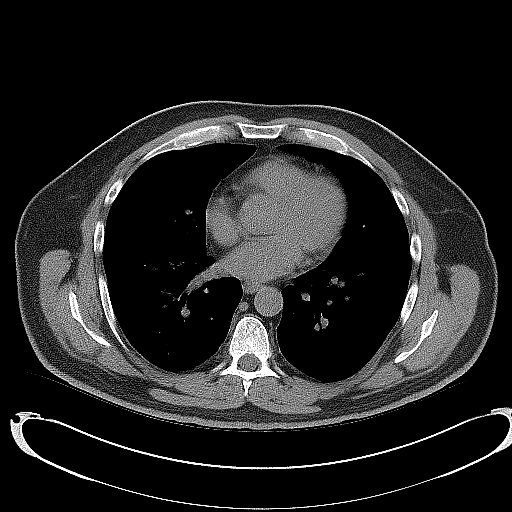
[im 22/28  lung]
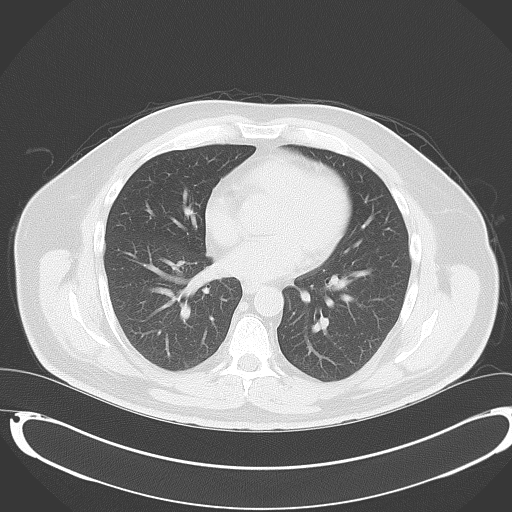

[Series 5: coronal · coronal · 0.76mm/px · 3 of 135 slices shown, 4 images]
[im 45/135  soft-tissue]
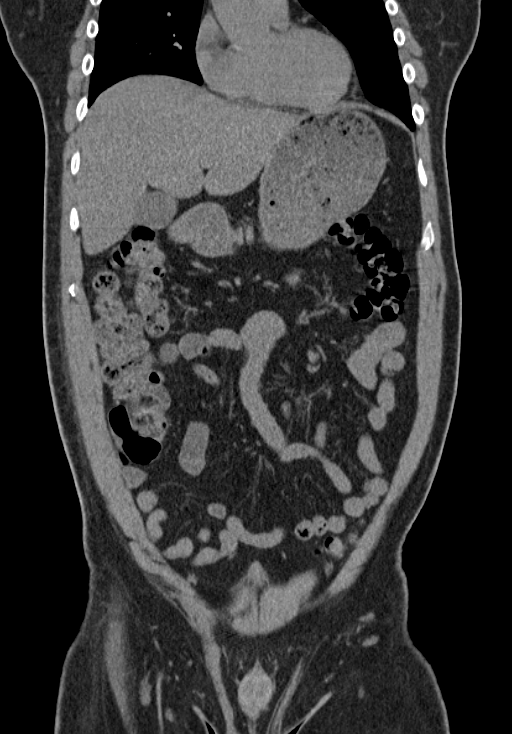
[im 60/135  soft-tissue]
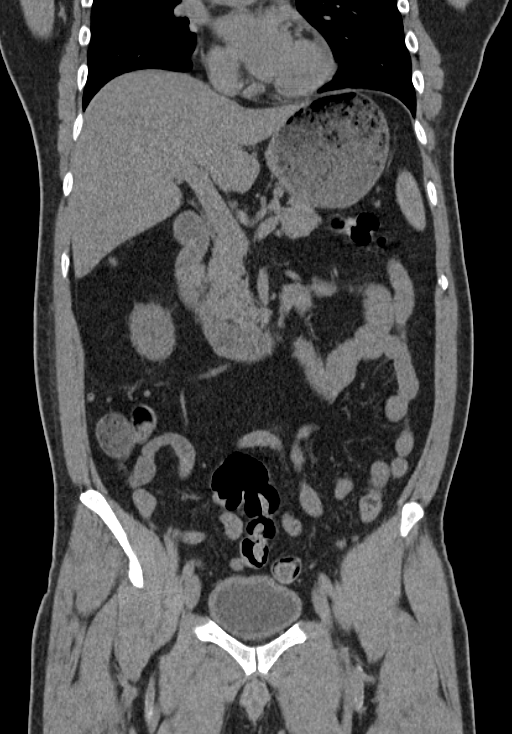
[im 60/135  bone]
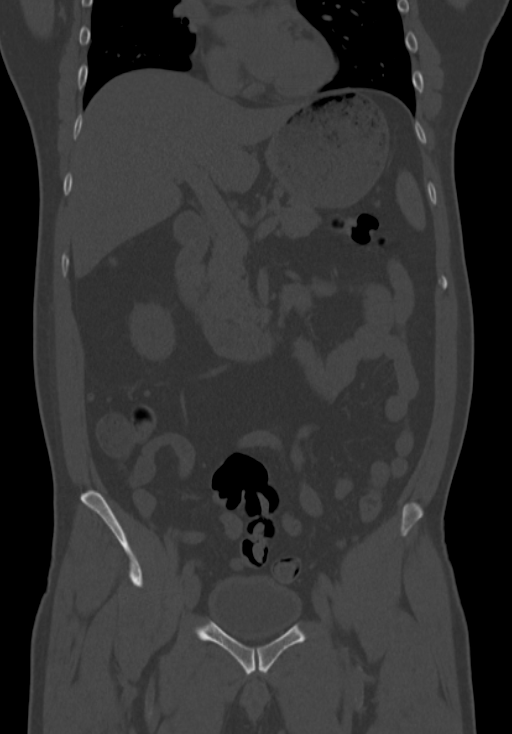
[im 75/135  soft-tissue]
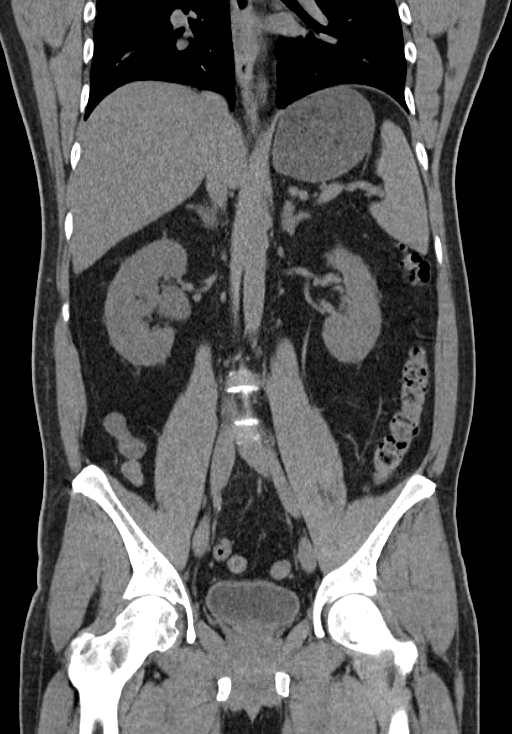

[Series 6: sagittal · sagittal · 0.60mm/px · 1 of 176 slices shown, 2 images]
[im 59/176  soft-tissue]
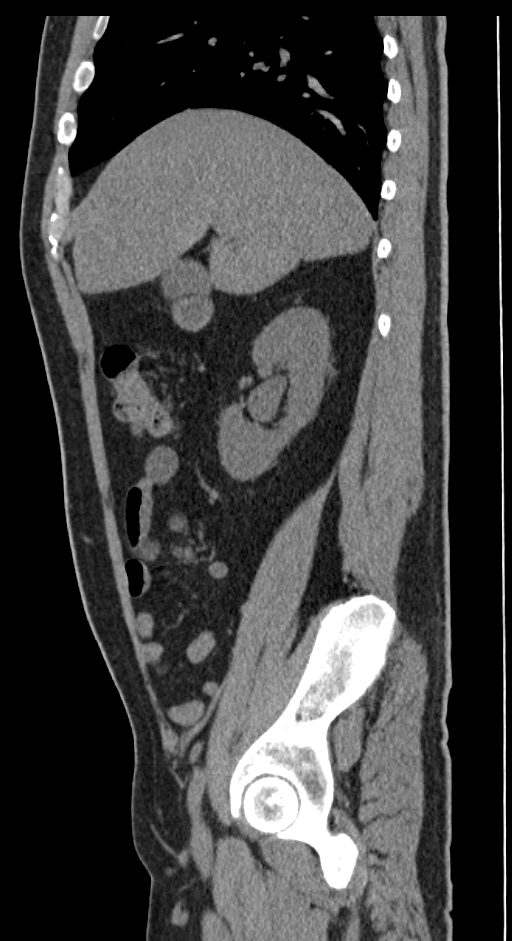
[im 59/176  bone]
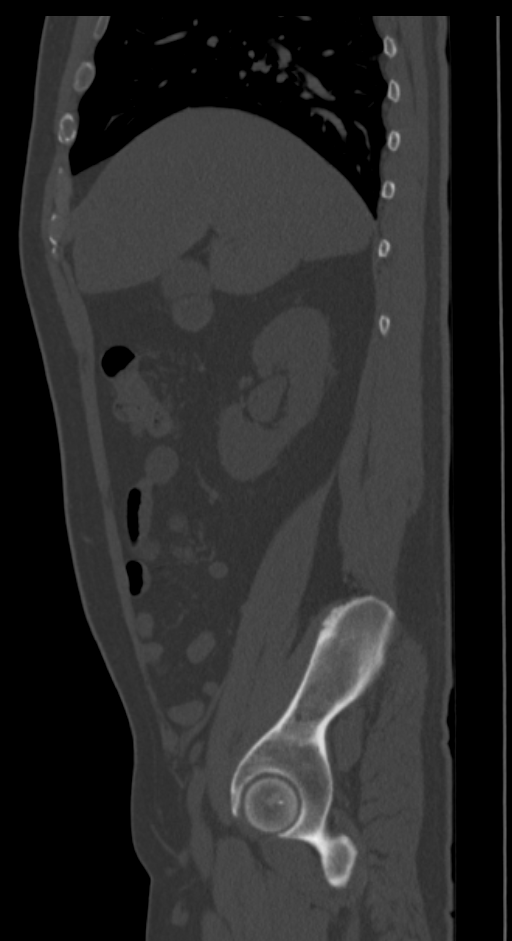

[8 of 46 positions shown; findings below may reference images not displayed]

FINDINGS: Lung bases:  Clear.  Heart normal size.

Hepatobiliary: Fatty infiltration of the liver. No liver mass or
focal lesion. Normal gallbladder. No bile duct dilation.

Spleen, pancreas, adrenal glands:  Normal.

Kidneys, ureters, bladder: 4.5 mm stone lies in the proximal to mid
aspect of the right ureter causing mild right hydronephrosis and
hydroureter above the stone. Below the stone, ureter is
decompressed. There are no additional ureteral stones. No intrarenal
stones on either side. No renal masses. No left hydronephrosis.
Normal left ureter. Bladder is unremarkable.

Lymph nodes:  No adenopathy.

Ascites:  None.

Gastrointestinal:  Unremarkable.  Normal appendix visualized.

Musculoskeletal: Mild degenerative changes of the thoracolumbar
spine. No osteoblastic or osteolytic lesions.
IMPRESSION: 1. 4.5 mm stone in the proximal to mid right ureter causes mild
right hydroureteronephrosis.
2. No other acute findings.  No intrarenal stones.
3. Hepatic steatosis.

## 2016-01-24 MED ORDER — KETOROLAC TROMETHAMINE 30 MG/ML IJ SOLN: 30.0000 mg | Freq: Once | INTRAMUSCULAR | Status: DC

## 2016-01-24 MED ORDER — SODIUM CHLORIDE 0.9 % IV SOLN
Freq: Once | INTRAVENOUS | Status: AC
  Administered 2016-01-24: 22:00:00 via INTRAVENOUS

## 2016-01-24 MED ORDER — ONDANSETRON HCL 4 MG/2ML IJ SOLN
INTRAMUSCULAR | Status: AC
  Administered 2016-01-24: 4 mg via INTRAVENOUS
  Filled 2016-01-24: qty 2

## 2016-01-24 MED ORDER — MORPHINE SULFATE (PF) 4 MG/ML IV SOLN
4.0000 mg | Freq: Once | INTRAVENOUS | Status: AC
  Administered 2016-01-24: 4 mg via INTRAVENOUS

## 2016-01-24 MED ORDER — HYDROMORPHONE HCL 1 MG/ML IJ SOLN
1.0000 mg | Freq: Once | INTRAMUSCULAR | Status: AC
  Administered 2016-01-24: 1 mg via INTRAVENOUS

## 2016-01-24 MED ORDER — SODIUM CHLORIDE 0.9 % IV BOLUS (SEPSIS)
1000.0000 mL | Freq: Once | INTRAVENOUS | Status: AC
  Administered 2016-01-24: 1000 mL via INTRAVENOUS

## 2016-01-24 MED ORDER — OXYCODONE-ACETAMINOPHEN 7.5-325 MG PO TABS: 1.0000 | ORAL_TABLET | ORAL | 0 refills | Status: DC | PRN

## 2016-01-24 MED ORDER — MORPHINE SULFATE (PF) 4 MG/ML IV SOLN
INTRAVENOUS | Status: AC
  Administered 2016-01-24: 4 mg via INTRAVENOUS
  Filled 2016-01-24: qty 1

## 2016-01-24 MED ORDER — KETOROLAC TROMETHAMINE 60 MG/2ML IM SOLN
INTRAMUSCULAR | Status: AC
  Administered 2016-01-24: 30 mg
  Filled 2016-01-24: qty 2

## 2016-01-24 MED ORDER — ONDANSETRON HCL 4 MG PO TABS: 4.0000 mg | ORAL_TABLET | Freq: Every day | ORAL | 1 refills | Status: DC | PRN

## 2016-01-24 MED ORDER — HYDROMORPHONE HCL 1 MG/ML IJ SOLN
INTRAMUSCULAR | Status: AC
  Filled 2016-01-24: qty 1

## 2016-01-24 MED ORDER — ONDANSETRON HCL 4 MG/2ML IJ SOLN
4.0000 mg | Freq: Once | INTRAMUSCULAR | Status: AC
  Administered 2016-01-24: 4 mg via INTRAVENOUS

## 2016-01-24 MED ORDER — HYDROMORPHONE HCL 1 MG/ML IJ SOLN
INTRAMUSCULAR | Status: AC
  Administered 2016-01-24: 1 mg via INTRAVENOUS
  Filled 2016-01-24: qty 1

## 2016-01-24 MED ORDER — HYDROMORPHONE HCL 1 MG/ML IJ SOLN
1.0000 mg | Freq: Once | INTRAMUSCULAR | Status: AC
Start: 2016-01-24 — End: 2016-01-24
  Administered 2016-01-24: 1 mg via INTRAVENOUS

## 2016-01-24 MED ORDER — ONDANSETRON HCL 4 MG/2ML IJ SOLN
4.0000 mg | INTRAMUSCULAR | Status: AC
  Administered 2016-01-24: 4 mg via INTRAVENOUS

## 2016-01-24 MED ORDER — TAMSULOSIN HCL 0.4 MG PO CAPS: 0.4000 mg | ORAL_CAPSULE | Freq: Every day | ORAL | 0 refills | Status: DC

## 2016-01-24 NOTE — ED Provider Notes (Addendum)
[facility] Emergency Department Provider Note        Time seen: ----------------------------------------- 9:19 PM on 01/24/2016 -----------------------------------------    I have reviewed the triage vital signs and the nursing notes.   HISTORY  Chief Complaint Flank Pain    HPI Alan Wise is a 45 y.o. male who presents to ER for right flank pain for last 90 minutes. Patient reports vomiting, has sharp pain on the right side consistent with prior kidney stones. Patient states she gets them about once a year. 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 He denies fevers or other complaints.   Past Medical History:  Diagnosis Date  . Allergy   . Diabetes mellitus ([place])   . History of kidney stones   . Hyperlipidemia   . Renal disorder    kidney stones    Patient Active Problem List   Diagnosis Date Noted  . Stricture, urethra 08/17/2014  . Type 2 diabetes mellitus with hyperglycemia ([place]) 06/12/2014  . HLD (hyperlipidemia) 06/12/2014    Past Surgical History:  Procedure Laterality Date  . KNEE ARTHROSCOPY W/ MENISCAL REPAIR Right 1990  . SKIN CANCER EXCISION  2014  . URETHRAL STRICTURE DILATATION    . WISDOM TOOTH EXTRACTION      Allergies Review of patient's allergies indicates no known allergies.  Social History Social History  Substance Use Topics  . Smoking status: Never Smoker  . Smokeless tobacco: Never Used  . Alcohol use 0.0 oz/week     Comment: rare    Review of Systems Constitutional: Negative for fever. Cardiovascular: Negative for chest pain. Respiratory: Negative for shortness of breath. Gastrointestinal: Positive for right flank pain, positive for vomiting Genitourinary: Negative for dysuria. Musculoskeletal: Negative for back pain. Skin: Negative for rash. Neurological: Negative for headaches, focal weakness or numbness.  10-point ROS otherwise  negative.  ____________________________________________   PHYSICAL EXAM:  VITAL SIGNS: ED Triage Vitals  Enc Vitals Group     BP --      Pulse Rate 01/24/16 2016 93     Resp 01/24/16 2016 18     Temp --      Temp Source 01/24/16 2016 Oral     SpO2 01/24/16 2016 97 %     Weight --      Height --      Head Circumference --      Peak Flow --      Pain Score 01/24/16 2031 10     Pain Loc --      Pain Edu? --      Excl. in [place]? --     Constitutional: Alert and oriented. Well appearing and in no distress. Eyes: Conjunctivae are normal. PERRL. Normal extraocular movements. ENT   Head: Normocephalic and atraumatic.   Nose: No congestion/rhinnorhea.   Mouth/Throat: Mucous membranes are moist.   Neck: No stridor. Cardiovascular: Normal rate, regular rhythm. No murmurs, rubs, or gallops. Respiratory: Normal respiratory effort without tachypnea nor retractions. Breath sounds are clear and equal bilaterally. No wheezes/rales/rhonchi. Gastrointestinal: Soft and nontender. Normal bowel sounds Musculoskeletal: Nontender with normal range of motion in all extremities. No lower extremity tenderness nor edema. Neurologic:  Normal speech and language. No gross focal neurologic deficits are appreciated.  Skin:  Skin is warm, dry and intact. No rash noted. Psychiatric: Mood and affect are normal. Speech and behavior are normal.  ____________________________________________  ED COURSE:  Pertinent labs & imaging results that were available during my care of the patient were reviewed  by me and considered in my medical decision making (see chart for details). Clinical Course  Patient received Toradol and morphine prior to my evaluation. Pain is mostly resolved at this point.  Procedures ____________________________________________   LABS (pertinent positives/negatives)  Labs Reviewed  CBC - Abnormal; Notable for the following:       Result Value   WBC 13.2 (*)    All other  components within normal limits  BASIC METABOLIC PANEL - Abnormal; Notable for the following:    Chloride 99 (*)    Glucose, Bld 190 (*)    All other components within normal limits  URINALYSIS COMPLETEWITH MICROSCOPIC ([facility] ONLY)    RADIOLOGY  CT renal protocol IMPRESSION: 1. 4.5 mm stone in the proximal to mid right ureter causes mild right hydroureteronephrosis. 2. No other acute findings.  No intrarenal stones. 3. Hepatic steatosis.  ____________________________________________  FINAL ASSESSMENT AND PLAN  Renal colic  Plan: Patient with labs and imaging as dictated above. Patient was initially feeling better after saline, Toradol and morphine however his symptoms came back. Patient began having severe right flank pain again with associated nausea and vomiting. This did not seem to be resolved with additional dose of Dilaudid and Zofran. I will discuss with the hospitalist for admission.   Earleen Newport, MD   Note: This dictation was prepared with Dragon dictation. Any transcriptional errors that result from this process are unintentional    Earleen Newport, MD 01/24/16 2120    Earleen Newport, MD 01/24/16 2251

## 2016-01-24 NOTE — ED Triage Notes (Signed)
C/O right flank RLQ pain x 90 minutes.  Patient vomiting.  Has history of kidney stones.

## 2016-01-24 NOTE — ED Notes (Signed)
AAOx3.  Patient initially presented diaphoretic and writhing in pain.  Medicated per [facility], patient pain improved to 4/10 and nausea relieved.  Pt to CT scan via wheelchair.

## 2016-01-25 ENCOUNTER — Encounter: Payer: Self-pay | Admitting: *Deleted

## 2016-01-25 ENCOUNTER — Observation Stay: Payer: BC Managed Care – PPO | Admitting: Anesthesiology

## 2016-01-25 ENCOUNTER — Telehealth: Payer: Self-pay

## 2016-01-25 ENCOUNTER — Encounter
Admission: EM | Disposition: A | Discharge status: Home / Self Care | Payer: Self-pay | Source: Home / Self Care | Attending: Emergency Medicine

## 2016-01-25 DIAGNOSIS — N201 Calculus of ureter: Secondary | ICD-10-CM

## 2016-01-25 DIAGNOSIS — N358 Other urethral stricture: Secondary | ICD-10-CM

## 2016-01-25 HISTORY — PX: CYSTOSCOPY WITH STENT PLACEMENT: SHX5790

## 2016-01-25 HISTORY — PX: URETEROSCOPY WITH HOLMIUM LASER LITHOTRIPSY: SHX6645

## 2016-01-25 LAB — GLUCOSE, CAPILLARY
GLUCOSE-CAPILLARY: 203 mg/dL — AB (ref 65–99)
GLUCOSE-CAPILLARY: 120 mg/dL — AB (ref 65–99)
Glucose-Capillary: 210 mg/dL — ABNORMAL HIGH (ref 65–99)
Glucose-Capillary: 152 mg/dL — ABNORMAL HIGH (ref 65–99)
Glucose-Capillary: 184 mg/dL — ABNORMAL HIGH (ref 65–99)
Glucose-Capillary: 292 mg/dL — ABNORMAL HIGH (ref 65–99)

## 2016-01-25 LAB — BASIC METABOLIC PANEL
Anion gap: 7 (ref 5–15)
BUN: 17 mg/dL (ref 6–20)
CHLORIDE: 101 mmol/L (ref 101–111)
CO2: 28 mmol/L (ref 22–32)
CREATININE: 0.96 mg/dL (ref 0.61–1.24)
Calcium: 8.7 mg/dL — ABNORMAL LOW (ref 8.9–10.3)
GFR calc non Af Amer: >60 mL/min (ref >60)
GLUCOSE: 246 mg/dL — AB (ref 65–99)
Potassium: 4.1 mmol/L (ref 3.5–5.1)
Sodium: 136 mmol/L (ref 135–145)

## 2016-01-25 LAB — CBC
HCT: 40.4 % (ref 40.0–52.0)
Hemoglobin: 14.4 g/dL (ref 13.0–18.0)
MCH: 29.9 pg (ref 26.0–34.0)
MCHC: 35.7 g/dL (ref 32.0–36.0)
MCV: 83.6 fL (ref 80.0–100.0)
PLATELETS: 196 10*3/uL (ref 150–440)
RBC: 4.83 MIL/uL (ref 4.40–5.90)
RDW: 12.6 % (ref 11.5–14.5)
WBC: 13.9 10*3/uL — ABNORMAL HIGH (ref 3.8–10.6)

## 2016-01-25 LAB — HEMOGLOBIN A1C: Hgb A1c MFr Bld: 8.4 % — ABNORMAL HIGH (ref 4.0–6.0)

## 2016-01-25 LAB — TROPONIN I: Troponin I: <0.03 ng/mL (ref <0.03)

## 2016-01-25 SURGERY — URETEROSCOPY, WITH LITHOTRIPSY USING HOLMIUM LASER
Anesthesia: General | Laterality: Right | Wound class: Clean Contaminated

## 2016-01-25 MED ORDER — SODIUM CHLORIDE 0.9% FLUSH: 3.0000 mL | Freq: Two times a day (BID) | INTRAVENOUS | Status: DC

## 2016-01-25 MED ORDER — HYDROMORPHONE HCL 1 MG/ML IJ SOLN
2.0000 mg | Freq: Once | INTRAMUSCULAR | Status: AC
  Administered 2016-01-25: 1 mg via INTRAVENOUS
  Filled 2016-01-25: qty 1

## 2016-01-25 MED ORDER — CEFAZOLIN SODIUM-DEXTROSE 2-4 GM/100ML-% IV SOLN
2.0000 g | INTRAVENOUS | Status: AC
  Administered 2016-01-25: 2 g via INTRAVENOUS
  Filled 2016-01-25: qty 100

## 2016-01-25 MED ORDER — ONDANSETRON HCL 4 MG/2ML IJ SOLN
4.0000 mg | Freq: Four times a day (QID) | INTRAMUSCULAR | Status: DC | PRN
  Administered 2016-01-25 (×2): 4 mg via INTRAVENOUS

## 2016-01-25 MED ORDER — FENTANYL CITRATE (PF) 100 MCG/2ML IJ SOLN
INTRAMUSCULAR | Status: AC
  Filled 2016-01-25: qty 2

## 2016-01-25 MED ORDER — FAMOTIDINE 20 MG PO TABS
ORAL_TABLET | ORAL | Status: AC
  Administered 2016-01-25: 20 mg via ORAL
  Filled 2016-01-25: qty 1

## 2016-01-25 MED ORDER — LACTATED RINGERS IV SOLN
INTRAVENOUS | Status: DC | PRN
  Administered 2016-01-25: 13:00:00 via INTRAVENOUS

## 2016-01-25 MED ORDER — INSULIN ASPART 100 UNIT/ML ~~LOC~~ SOLN
0.0000 [IU] | Freq: Three times a day (TID) | SUBCUTANEOUS | Status: DC
  Administered 2016-01-25: 3 [IU] via SUBCUTANEOUS
  Administered 2016-01-25: 2 [IU] via SUBCUTANEOUS
  Filled 2016-01-25 (×2): qty 3
  Filled 2016-01-25 (×2): qty 2

## 2016-01-25 MED ORDER — BELLADONNA ALKALOIDS-OPIUM 16.2-60 MG RE SUPP
RECTAL | Status: AC
  Filled 2016-01-25: qty 1

## 2016-01-25 MED ORDER — FAMOTIDINE 20 MG PO TABS
20.0000 mg | ORAL_TABLET | Freq: Once | ORAL | Status: AC
  Administered 2016-01-25: 20 mg via ORAL

## 2016-01-25 MED ORDER — HYDROMORPHONE HCL 1 MG/ML IJ SOLN
1.0000 mg | INTRAMUSCULAR | Status: DC | PRN
  Administered 2016-01-25: 1 mg via INTRAVENOUS
  Filled 2016-01-25 (×2): qty 1

## 2016-01-25 MED ORDER — FLUMAZENIL 0.5 MG/5ML IV SOLN
INTRAVENOUS | Status: DC | PRN
  Administered 2016-01-25: 0.2 mg via INTRAVENOUS

## 2016-01-25 MED ORDER — BELLADONNA ALKALOIDS-OPIUM 16.2-60 MG RE SUPP
1.0000 | Freq: Every day | RECTAL | Status: DC
  Administered 2016-01-25: 1 via RECTAL

## 2016-01-25 MED ORDER — LORAZEPAM 2 MG/ML IJ SOLN
1.0000 mg | Freq: Once | INTRAMUSCULAR | Status: AC
  Administered 2016-01-25: 1 mg via INTRAVENOUS

## 2016-01-25 MED ORDER — ACETAMINOPHEN 325 MG PO TABS: 650.0000 mg | ORAL_TABLET | Freq: Four times a day (QID) | ORAL | Status: DC | PRN

## 2016-01-25 MED ORDER — ONDANSETRON HCL 4 MG PO TABS: 4.0000 mg | ORAL_TABLET | Freq: Four times a day (QID) | ORAL | Status: DC | PRN

## 2016-01-25 MED ORDER — OXYBUTYNIN CHLORIDE 5 MG PO TABS
5.0000 mg | ORAL_TABLET | Freq: Three times a day (TID) | ORAL | Status: DC
  Administered 2016-01-25: 5 mg via ORAL
  Filled 2016-01-25: qty 1

## 2016-01-25 MED ORDER — FENTANYL CITRATE (PF) 100 MCG/2ML IJ SOLN
25.0000 ug | INTRAMUSCULAR | Status: DC | PRN
  Administered 2016-01-25: 50 ug via INTRAVENOUS

## 2016-01-25 MED ORDER — PROMETHAZINE HCL 25 MG/ML IJ SOLN: 6.2500 mg | INTRAMUSCULAR | Status: DC | PRN

## 2016-01-25 MED ORDER — PROPOFOL 10 MG/ML IV BOLUS
INTRAVENOUS | Status: DC | PRN
  Administered 2016-01-25: 30 mg via INTRAVENOUS
  Administered 2016-01-25: 170 mg via INTRAVENOUS

## 2016-01-25 MED ORDER — FENTANYL CITRATE (PF) 100 MCG/2ML IJ SOLN
INTRAMUSCULAR | Status: DC | PRN
  Administered 2016-01-25: 100 ug via INTRAVENOUS

## 2016-01-25 MED ORDER — OXYCODONE HCL 5 MG PO TABS: 5.0000 mg | ORAL_TABLET | ORAL | 0 refills | Status: DC | PRN

## 2016-01-25 MED ORDER — MIDAZOLAM HCL 2 MG/2ML IJ SOLN
INTRAMUSCULAR | Status: DC | PRN
  Administered 2016-01-25: 2 mg via INTRAVENOUS

## 2016-01-25 MED ORDER — OXYCODONE HCL 5 MG PO TABS
5.0000 mg | ORAL_TABLET | ORAL | Status: DC | PRN
  Administered 2016-01-25: 5 mg via ORAL
  Filled 2016-01-25: qty 1

## 2016-01-25 MED ORDER — OXYBUTYNIN CHLORIDE 5 MG PO TABS: 5.0000 mg | ORAL_TABLET | Freq: Three times a day (TID) | ORAL | 0 refills | Status: DC | PRN

## 2016-01-25 MED ORDER — INSULIN ASPART 100 UNIT/ML ~~LOC~~ SOLN
0.0000 [IU] | Freq: Every day | SUBCUTANEOUS | Status: DC
  Administered 2016-01-25: 3 [IU] via SUBCUTANEOUS
  Administered 2016-01-25: 2 [IU] via SUBCUTANEOUS
  Filled 2016-01-25 (×2): qty 2

## 2016-01-25 MED ORDER — MIDAZOLAM HCL 5 MG/5ML IJ SOLN
INTRAMUSCULAR | Status: AC
  Administered 2016-01-25: 1 mg via INTRAVENOUS
  Filled 2016-01-25: qty 5

## 2016-01-25 MED ORDER — CEFAZOLIN (ANCEF) 1 G IV SOLR: 2.0000 g | INTRAVENOUS | Status: DC

## 2016-01-25 MED ORDER — LIVING WELL WITH DIABETES BOOK
Freq: Once | Status: AC
  Administered 2016-01-25: 15:00:00
  Filled 2016-01-25: qty 1

## 2016-01-25 MED ORDER — LORAZEPAM 2 MG/ML IJ SOLN
INTRAMUSCULAR | Status: AC
  Filled 2016-01-25: qty 1

## 2016-01-25 MED ORDER — CEPHALEXIN 500 MG PO CAPS: 500.0000 mg | ORAL_CAPSULE | Freq: Three times a day (TID) | ORAL | 0 refills | Status: AC

## 2016-01-25 MED ORDER — MIDAZOLAM HCL 5 MG/5ML IJ SOLN
1.0000 mg | INTRAMUSCULAR | Status: DC | PRN
  Administered 2016-01-25 (×2): 1 mg via INTRAVENOUS

## 2016-01-25 MED ORDER — NALOXONE HCL 0.4 MG/ML IJ SOLN
INTRAMUSCULAR | Status: DC | PRN
  Administered 2016-01-25: 40 ug via INTRAVENOUS

## 2016-01-25 MED ORDER — ACETAMINOPHEN 650 MG RE SUPP
650.0000 mg | Freq: Four times a day (QID) | RECTAL | Status: DC | PRN
Start: 2016-01-25 — End: 2016-01-26

## 2016-01-25 MED ORDER — ENOXAPARIN SODIUM 40 MG/0.4ML ~~LOC~~ SOLN
40.0000 mg | Freq: Every day | SUBCUTANEOUS | Status: DC
  Administered 2016-01-25: 40 mg via SUBCUTANEOUS
  Filled 2016-01-25: qty 0.4

## 2016-01-25 MED ORDER — SODIUM CHLORIDE 0.9 % IV SOLN
INTRAVENOUS | Status: DC
  Administered 2016-01-25: 12:00:00 via INTRAVENOUS

## 2016-01-25 MED ORDER — DEXAMETHASONE SODIUM PHOSPHATE 10 MG/ML IJ SOLN
INTRAMUSCULAR | Status: DC | PRN
  Administered 2016-01-25: 10 mg via INTRAVENOUS

## 2016-01-25 MED ORDER — SODIUM CHLORIDE 0.9 % IV SOLN
INTRAVENOUS | Status: AC
Start: 2016-01-25 — End: 2016-01-25
  Administered 2016-01-25: 02:00:00 via INTRAVENOUS

## 2016-01-25 SURGICAL SUPPLY — 28 items
BACTOSHIELD CHG 4% 4OZ (MISCELLANEOUS) ×1
BASKET ZERO TIP 1.9FR (BASKET) ×2 IMPLANT
CATH URETL 5X70 OPEN END (CATHETERS) ×2 IMPLANT
CNTNR SPEC 2.5X3XGRAD LEK (MISCELLANEOUS) ×1
CONT SPEC 4OZ STER OR WHT (MISCELLANEOUS) ×1
CONTAINER SPEC 2.5X3XGRAD LEK (MISCELLANEOUS) ×1 IMPLANT
FIBER LASER LITHO 273 (Laser) ×2 IMPLANT
GLOVE BIO SURGEON STRL SZ7 (GLOVE) ×2 IMPLANT
GLOVE BIO SURGEON STRL SZ7.5 (GLOVE) ×2 IMPLANT
GOWN STRL REUS W/ TWL LRG LVL4 (GOWN DISPOSABLE) ×1 IMPLANT
GOWN STRL REUS W/TWL LRG LVL4 (GOWN DISPOSABLE) ×1
GOWN STRL REUS W/TWL XL LVL3 (GOWN DISPOSABLE) ×2 IMPLANT
GUIDEWIRE SUPER STIFF (WIRE) IMPLANT
INTRODUCER DILATOR DOUBLE (INTRODUCER) IMPLANT
KIT RM TURNOVER CYSTO AR (KITS) ×2 IMPLANT
PACK CYSTO AR (MISCELLANEOUS) ×2 IMPLANT
SCRUB CHG 4% DYNA-HEX 4OZ (MISCELLANEOUS) ×1 IMPLANT
SENSORWIRE 0.038 NOT ANGLED (WIRE) ×2
SET CYSTO W/LG BORE CLAMP LF (SET/KITS/TRAYS/PACK) ×2 IMPLANT
SHEATH URETERAL 13/15X36 1L (SHEATH) IMPLANT
SOL .9 NS 3000ML IRR  AL (IV SOLUTION) ×1
SOL .9 NS 3000ML IRR UROMATIC (IV SOLUTION) ×1 IMPLANT
STENT URET 6FRX24 CONTOUR (STENTS) IMPLANT
STENT URET 6FRX26 CONTOUR (STENTS) ×2 IMPLANT
SURGILUBE 2OZ TUBE FLIPTOP (MISCELLANEOUS) ×2 IMPLANT
SYRINGE IRR TOOMEY STRL 70CC (SYRINGE) ×2 IMPLANT
WATER STERILE IRR 1000ML POUR (IV SOLUTION) ×2 IMPLANT
WIRE SENSOR 0.038 NOT ANGLED (WIRE) ×1 IMPLANT

## 2016-01-25 NOTE — Telephone Encounter (Signed)
-----   Message from Nickie Retort, MD sent at 01/25/2016  1:45 PM EDT ----- Patient needs to see me next week for cysto/stent removal

## 2016-01-25 NOTE — H&P (Signed)
[place] [facility] [place] NAME: Alan Wise    MR#:  [id number]  DATE OF BIRTH:  [birth year]  DATE OF ADMISSION:  01/24/2016  PRIMARY CARE PHYSICIAN: Webb Silversmith, NP   REQUESTING/REFERRING PHYSICIAN: Jimmye Norman, MD  CHIEF COMPLAINT:   Chief Complaint  Patient presents with  . Flank Pain    HISTORY OF PRESENT ILLNESS:  Alan Wise  is a 45 y.o. male who presents with Right flank pain. Patient has a known history of passing kidney stones, and states that he felt like this pain tonight was similar. He came in to the ED and a CT confirmed the same. He has a 4.5 mm right-sided stone. His pain was difficult to control in the ED tonight and so hospitals were called for admission for observation to try and optimize his pain control. On staying with the patient he states that he has also been trying to establish with a urologist here as he has some history tissue with urethral stricture he is also had larger stones in the past which had to be broken up.  PAST MEDICAL HISTORY:   Past Medical History:  Diagnosis Date  . Allergy   . Diabetes mellitus ([place])   . History of kidney stones   . Hyperlipidemia   . Renal disorder    kidney stones    PAST SURGICAL HISTORY:   Past Surgical History:  Procedure Laterality Date  . KNEE ARTHROSCOPY W/ MENISCAL REPAIR Right 1990  . SKIN CANCER EXCISION  2014  . URETHRAL STRICTURE DILATATION    . WISDOM TOOTH EXTRACTION      SOCIAL HISTORY:   Social History  Substance Use Topics  . Smoking status: Never Smoker  . Smokeless tobacco: Never Used  . Alcohol use 0.0 oz/week     Comment: rare    FAMILY HISTORY:   Family History  Problem Relation Age of Onset  . Hyperlipidemia Father   . Hypertension Father   . Diabetes Father   . Benign prostatic hyperplasia Father   . Stroke Maternal Grandmother   . Diabetes Maternal Grandmother   . Stroke Paternal Grandmother   . Hypertension  Paternal Grandmother   . Kidney disease Neg Hx   . Prostate cancer Neg Hx     DRUG ALLERGIES:  No Known Allergies  MEDICATIONS AT HOME:   Prior to Admission medications   Medication Sig Start Date End Date Taking? Authorizing Provider  FREESTYLE LITE test strip USE TWO TIMES DAILY TO TEST BLOOD SUGAR 11/26/15  Yes Jearld Fenton, NP  metFORMIN (GLUCOPHAGE) 1000 MG tablet TAKE 1 TABLET (1,000 MG TOTAL) BY MOUTH DAILY WITH BREAKFAST. 08/15/15  Yes Jearld Fenton, NP  ondansetron (ZOFRAN) 4 MG tablet Take 1 tablet (4 mg total) by mouth daily as needed for nausea or vomiting. 01/24/16   Earleen Newport, MD  oxyCODONE-acetaminophen (PERCOCET) 7.5-325 MG tablet Take 1 tablet by mouth every 4 (four) hours as needed for severe pain. 01/24/16 01/23/17  Earleen Newport, MD  tamsulosin (FLOMAX) 0.4 MG CAPS capsule Take 1 capsule (0.4 mg total) by mouth daily after breakfast. 01/24/16   Earleen Newport, MD    REVIEW OF SYSTEMS:  Review of Systems  Constitutional: Negative for chills, fever, malaise/fatigue and weight loss.  HENT: Negative for ear pain, hearing loss and tinnitus.   Eyes: Negative for blurred vision, double vision, pain and redness.  Respiratory: Negative for cough, hemoptysis and shortness of breath.   Cardiovascular:  Negative for chest pain, palpitations, orthopnea and leg swelling.  Gastrointestinal: Negative for abdominal pain, constipation, diarrhea, nausea and vomiting.  Genitourinary: Positive for dysuria and flank pain. Negative for frequency and hematuria.  Musculoskeletal: Negative for back pain, joint pain and neck pain.  Skin:       No acne, rash, or lesions  Neurological: Negative for dizziness, tremors, focal weakness and weakness.  Endo/Heme/Allergies: Negative for polydipsia. Does not bruise/bleed easily.  Psychiatric/Behavioral: Negative for depression. The patient is not nervous/anxious and does not have insomnia.      VITAL SIGNS:   Vitals:   01/24/16  2016 01/24/16 2318 01/24/16 2322  BP:  140/90   Pulse: 93 84 77  Resp: 18 15 11   TempSrc: Oral    SpO2: 97% 94% (!) 89%   Wt Readings from Last 3 Encounters:  11/20/15 76.9 kg (169 lb 8 oz)  05/21/15 79.4 kg (175 lb)  03/19/15 79.9 kg (176 lb 4 oz)    PHYSICAL EXAMINATION:  Physical Exam  Vitals reviewed. Constitutional: He is oriented to person, place, and time. He appears well-developed and well-nourished. No distress.  HENT:  Head: Normocephalic and atraumatic.  Mouth/Throat: Oropharynx is clear and moist.  Eyes: Conjunctivae and EOM are normal. Pupils are equal, round, and reactive to light. No scleral icterus.  Neck: Normal range of motion. Neck supple. No JVD present. No thyromegaly present.  Cardiovascular: Normal rate, regular rhythm and intact distal pulses.  Exam reveals no gallop and no friction rub.   No murmur heard. Respiratory: Effort normal and breath sounds normal. No respiratory distress. He has no wheezes. He has no rales.  GI: Soft. Bowel sounds are normal. He exhibits no distension. There is tenderness (right abdomen).  Musculoskeletal: Normal range of motion. He exhibits no edema.  No arthritis, no gout  Lymphadenopathy:    He has no cervical adenopathy.  Neurological: He is alert and oriented to person, place, and time. No cranial nerve deficit.  No dysarthria, no aphasia  Skin: Skin is warm and dry. No rash noted. No erythema.  Psychiatric: He has a normal mood and affect. His behavior is normal. Judgment and thought content normal.    LABORATORY PANEL:   CBC  Recent Labs Lab 01/24/16 2027  WBC 13.2*  HGB 17.2  HCT 49.4  PLT 295   ------------------------------------------------------------------------------------------------------------------  Chemistries   Recent Labs Lab 01/24/16 2027  NA 138  K 3.7  CL 99*  CO2 24  GLUCOSE 190*  BUN 17  CREATININE 1.08  CALCIUM 9.6    ------------------------------------------------------------------------------------------------------------------  Cardiac Enzymes No results for input(s): TROPONINI in the last 168 hours. ------------------------------------------------------------------------------------------------------------------  RADIOLOGY:  Ct Renal Stone Study  Result Date: 01/24/2016 CLINICAL DATA:  Right flank pain for the last 90 minutes. Vomiting. History of kidney stones. EXAM: CT ABDOMEN AND PELVIS WITHOUT CONTRAST TECHNIQUE: Multidetector CT imaging of the abdomen and pelvis was performed following the standard protocol without IV contrast. COMPARISON:  02/04/2015 FINDINGS: Lung bases:  Clear.  Heart normal size. Hepatobiliary: Fatty infiltration of the liver. No liver mass or focal lesion. Normal gallbladder. No bile duct dilation. Spleen, pancreas, adrenal glands:  Normal. Kidneys, ureters, bladder: 4.5 mm stone lies in the proximal to mid aspect of the right ureter causing mild right hydronephrosis and hydroureter above the stone. Below the stone, ureter is decompressed. There are no additional ureteral stones. No intrarenal stones on either side. No renal masses. No left hydronephrosis. Normal left ureter. Bladder is unremarkable. Lymph nodes:  No adenopathy. Ascites:  None. Gastrointestinal:  Unremarkable.  Normal appendix visualized. Musculoskeletal: Mild degenerative changes of the thoracolumbar spine. No osteoblastic or osteolytic lesions. IMPRESSION: 1. 4.5 mm stone in the proximal to mid right ureter causes mild right hydroureteronephrosis. 2. No other acute findings.  No intrarenal stones. 3. Hepatic steatosis. Electronically Signed   By: Lajean Manes M.D.   On: 01/24/2016 20:49    EKG:  No orders found for this or any previous visit.  IMPRESSION AND PLAN:  Principal Problem:   Kidney stone - admit for observation, optimize pain control, IV fluids, urology consult. Active Problems:   Type 2 diabetes  mellitus with hyperglycemia (HCC) - on a scale insulin with corresponding glucose checks   Stricture, urethra - patient states that he has historically dealt with this and has had it treated before periodically. He is trying to establish with a urologist here.   HLD (hyperlipidemia) - continue home meds  All the records are reviewed and case discussed with ED provider. Management plans discussed with the patient and/or family.  DVT PROPHYLAXIS: SubQ lovenox  GI PROPHYLAXIS: None  ADMISSION STATUS:Observation  CODE STATUS: Full  Code Status History    This patient does not have a recorded code status. Please follow your organizational policy for patients in this situation.      TOTAL TIME TAKING CARE OF THIS PATIENT: 40 minutes.    Laythan Hayter [place] 01/25/2016, 12:02 AM  [facility]  Office  [phone]  CC: Primary care physician; Webb Silversmith, NP

## 2016-01-25 NOTE — Anesthesia Procedure Notes (Signed)
Procedure Name: LMA Insertion Date/Time: 01/25/2016 1:25 PM Performed by: Nelda Marseille Pre-anesthesia Checklist: Patient identified, Patient being monitored, Timeout performed, Emergency Drugs available and Suction available Patient Re-evaluated:Patient Re-evaluated prior to inductionOxygen Delivery Method: Circle system utilized Preoxygenation: Pre-oxygenation with 100% oxygen Intubation Type: IV induction Ventilation: Mask ventilation without difficulty LMA: LMA inserted LMA Size: 4.5 Tube type: Oral Number of attempts: 1 Placement Confirmation: positive ETCO2 and breath sounds checked- equal and bilateral Tube secured with: Tape Dental Injury: Teeth and Oropharynx as per pre-operative assessment

## 2016-01-25 NOTE — Anesthesia Postprocedure Evaluation (Signed)
Anesthesia Post Note  Patient: Alan Wise  Procedure(s) Performed: Procedure(s) (LRB): URETEROSCOPY WITH HOLMIUM LASER LITHOTRIPSY (Right) CYSTOSCOPY WITH STENT PLACEMENT (Right)  Patient location during evaluation: PACU Anesthesia Type: General Level of consciousness: awake and alert Pain management: pain level controlled Vital Signs Assessment: post-procedure vital signs reviewed and stable Respiratory status: spontaneous breathing, nonlabored ventilation, respiratory function stable and patient connected to nasal cannula oxygen Cardiovascular status: blood pressure returned to baseline and stable Postop Assessment: no signs of nausea or vomiting Anesthetic complications: no    Last Vitals:  Vitals:   01/25/16 1432 01/25/16 1447  BP: 131/75 120/73  Pulse: 70 66  Resp: (!) 8 (!) 8  Temp:      Last Pain:  Vitals:   01/25/16 1447  TempSrc:   PainSc: Asleep                 Matilde Markie

## 2016-01-25 NOTE — Progress Notes (Signed)
Inpatient Diabetes Program Recommendations  AACE/ADA: New Consensus Statement on Inpatient Glycemic Control (2015)  Target Ranges:  Prepandial:   less than 140 mg/dL      Peak postprandial:   less than 180 mg/dL (1-2 hours)      Critically ill patients:  140 - 180 mg/dL   Lab Results  Component Value Date   GLUCAP 152 (H) 01/25/2016   HGBA1C 10.3 (H) 11/12/2015    Review of Glycemic Control  Results for Alan, Wise (MRN [id number]) as of 01/25/2016 12:27  Ref. Range 01/25/2016 01:47 01/25/2016 08:03 01/25/2016 11:34  Glucose-Capillary Latest Ref Range: 65 - 99 mg/dL 210 (H) 203 (H) 152 (H)    Diabetes history: Type 2- A1C pending Outpatient Diabetes medications: Metformin 1000 mg qday Current orders for Inpatient glycemic control: Novolog sensitive correction scale 0-9 units tid, Novolog 0-5 units qhs  Inpatient Diabetes Program Recommendations:  Agree with current orders for blood sugar management.    Concerned re: elevated A1C in May 2017- if it remains elevated, consider a referral to outpatient diabetes education at the [facility].  Will order Living Well with Diabetes.   Gentry Fitz, RN, BA, MHA, CDE Diabetes Coordinator Inpatient Diabetes Program  [phone] (Team Pager) [phone] ([place]) 01/25/2016 12:35 PM

## 2016-01-25 NOTE — Discharge Summary (Signed)
[place] [facility] [place] NAME: Alan Wise    MR#:  [id number]  DATE OF BIRTH:  11-28-1970  DATE OF ADMISSION:  01/24/2016 ADMITTING PHYSICIAN: Lance Coon, MD  DATE OF DISCHARGE: 01/25/2016  PRIMARY CARE PHYSICIAN: Webb Silversmith, NP    ADMISSION DIAGNOSIS:  Kidney stone [N20.0]  DISCHARGE DIAGNOSIS:  Principal Problem:   Kidney stone Active Problems:   Type 2 diabetes mellitus with hyperglycemia (HCC)   HLD (hyperlipidemia)   Stricture, urethra   SECONDARY DIAGNOSIS:   Past Medical History:  Diagnosis Date  . Allergy   . Diabetes mellitus ([place] [address])   . History of kidney stones   . Hyperlipidemia   . Renal disorder    kidney stones    HOSPITAL COURSE:   45 year old male with a history of kidney stones presents with right flank pain and found to have 4.5 mm right sided kidney stone. For further details please refer the H&P.  1. Right ureteral stone: Patient was seen and evaluated by urology. Patient underwent surgical extraction of stone.  2. History of urethral stricture: Management as per urology. 3.. Type 2 diabetes: Patient will resume outpatient medications including metformin. He will continue to monitor blood sugars.  DISCHARGE CONDITIONS AND DIET:  Stable on diabetic diet  CONSULTS OBTAINED:  Treatment Team:  Nickie Retort, MD  DRUG ALLERGIES:  No Known Allergies  DISCHARGE MEDICATIONS:   Current Discharge Medication List    START taking these medications   Details  cephALEXin (KEFLEX) 500 MG capsule Take 1 capsule (500 mg total) by mouth 3 (three) times daily. Qty: 6 capsule, Refills: 0    oxyCODONE (OXY IR/ROXICODONE) 5 MG immediate release tablet Take 1 tablet (5 mg total) by mouth every 4 (four) hours as needed for moderate pain. Qty: 30 tablet, Refills: 0      CONTINUE these medications which have NOT CHANGED   Details  FREESTYLE LITE test strip USE TWO TIMES DAILY TO TEST BLOOD SUGAR Qty: 50  each, Refills: 11    metFORMIN (GLUCOPHAGE) 1000 MG tablet TAKE 1 TABLET (1,000 MG TOTAL) BY MOUTH DAILY WITH BREAKFAST. Qty: 90 tablet, Refills: 1              Today   CHIEF COMPLAINT:  Doing ok this ampain controlled   VITAL SIGNS:  Blood pressure 130/80, pulse 65, temperature 97.4 F (36.3 C), resp. rate 13, height 5\' 8"  (1.727 m), weight 74.7 kg (164 lb 11.2 oz), SpO2 100 %.   REVIEW OF SYSTEMS:  Review of Systems  Constitutional: Negative.  Negative for chills, fever and malaise/fatigue.  HENT: Negative.  Negative for ear discharge, ear pain, hearing loss, nosebleeds and sore throat.   Eyes: Negative.  Negative for blurred vision and pain.  Respiratory: Negative.  Negative for cough, hemoptysis, shortness of breath and wheezing.   Cardiovascular: Negative.  Negative for chest pain, palpitations and leg swelling.  Gastrointestinal: Negative.  Negative for abdominal pain, blood in stool, diarrhea, nausea and vomiting.  Genitourinary: Negative.  Negative for dysuria.       Right flank pain  Musculoskeletal: Negative.  Negative for back pain.  Skin: Negative.   Neurological: Negative for dizziness, tremors, speech change, focal weakness, seizures and headaches.  Endo/Heme/Allergies: Negative.  Does not bruise/bleed easily.  Psychiatric/Behavioral: Negative.  Negative for depression, hallucinations and suicidal ideas.     PHYSICAL EXAMINATION:  GENERAL:  45 y.o.-year-old patient lying in the bed with no acute distress.  NECK:  Supple, no jugular venous distention. No thyroid enlargement, no tenderness.  LUNGS: Normal breath sounds bilaterally, no wheezing, rales,rhonchi  No use of accessory muscles of respiration.  CARDIOVASCULAR: S1, S2 normal. No murmurs, rubs, or gallops.  ABDOMEN: Soft, non-tender, non-distended. Bowel sounds present. No organomegaly or mass.  Right EXTREMITIES: No pedal edema, cyanosis, or clubbing.  PSYCHIATRIC: The patient is alert and  oriented x 3.  SKIN: No obvious rash, lesion, or ulcer.   DATA REVIEW:   CBC  Recent Labs Lab 01/25/16 0511  WBC 13.9*  HGB 14.4  HCT 40.4  PLT 196    Chemistries   Recent Labs Lab 01/25/16 0511  NA 136  K 4.1  CL 101  CO2 28  GLUCOSE 246*  BUN 17  CREATININE 0.96  CALCIUM 8.7*    Cardiac Enzymes  Recent Labs Lab 01/25/16 0511  TROPONINI <0.03    Microbiology Results  @MICRORSLT48 @  RADIOLOGY:  Ct Renal Stone Study  Result Date: 01/24/2016 CLINICAL DATA:  Right flank pain for the last 90 minutes. Vomiting. History of kidney stones. EXAM: CT ABDOMEN AND PELVIS WITHOUT CONTRAST TECHNIQUE: Multidetector CT imaging of the abdomen and pelvis was performed following the standard protocol without IV contrast. COMPARISON:  02/04/2015 FINDINGS: Lung bases:  Clear.  Heart normal size. Hepatobiliary: Fatty infiltration of the liver. No liver mass or focal lesion. Normal gallbladder. No bile duct dilation. Spleen, pancreas, adrenal glands:  Normal. Kidneys, ureters, bladder: 4.5 mm stone lies in the proximal to mid aspect of the right ureter causing mild right hydronephrosis and hydroureter above the stone. Below the stone, ureter is decompressed. There are no additional ureteral stones. No intrarenal stones on either side. No renal masses. No left hydronephrosis. Normal left ureter. Bladder is unremarkable. Lymph nodes:  No adenopathy. Ascites:  None. Gastrointestinal:  Unremarkable.  Normal appendix visualized. Musculoskeletal: Mild degenerative changes of the thoracolumbar spine. No osteoblastic or osteolytic lesions. IMPRESSION: 1. 4.5 mm stone in the proximal to mid right ureter causes mild right hydroureteronephrosis. 2. No other acute findings.  No intrarenal stones. 3. Hepatic steatosis. Electronically Signed   By: Lajean Manes M.D.   On: 01/24/2016 20:49      Management plans discussed with the patient and he is in agreement. Stable for discharge  home D/wurology Patient should follow up with urology  CODE STATUS:     Code Status Orders        Start     Ordered   01/25/16 0109  Full code  Continuous     01/25/16 0108    Code Status History    Date Active Date Inactive Code Status Order ID Comments User Context   This patient has a current code status but no historical code status.      TOTAL TIME TAKING CARE OF THIS PATIENT: 35 minutes.    Note: This dictation was prepared with Dragon dictation along with smaller phrase technology. Any transcriptional errors that result from this process are unintentional.  Elvin Mccartin M.D on 01/25/2016 at 1:57 PM  Between 7am to 6pm - Pager - [phone] After 6pm go to [email or web address] - password [email or web address] [facility]  Office  [phone]  CC: Primary care physician; Webb Silversmith, NP

## 2016-01-25 NOTE — Progress Notes (Addendum)
Pt returned from OR in severe pain. unable to sit still. Legs writhing in the bed. Tachypneic . Unable to console or reason with pt. Notified Dr Benjie Karvonen. Order for Dilaudid 2mg  IV once received.  Had to go to pharmacy to get med. 1mg  was given and pt immediately went to sleep. 2nd mg not given and witnessed by [employer]. VSS, cont pulse ox left on pt at this time.   65 - Dr Benjie Karvonen called to check on pt. Informed her that only one mg of dilaudid had been given. Pt is sleeping soundly.  She states that he can be dc home as late tonight as he wants.  If he is unable to eat, stay awake and ambulate to suspend her DC home order.

## 2016-01-25 NOTE — Anesthesia Preprocedure Evaluation (Addendum)
Anesthesia Evaluation  Patient identified by MRN, date of birth, ID band Patient awake    Reviewed: Allergy & Precautions, H&P , NPO status , Patient's Chart, lab work & pertinent test results, reviewed documented beta blocker date and time   History of Anesthesia Complications (+) PONV, PROLONGED EMERGENCE and history of anesthetic complications  Airway Mallampati: IV  TM Distance: >3 FB Neck ROM: full    Dental no notable dental hx. (+) Teeth Intact   Pulmonary neg pulmonary ROS,    Pulmonary exam normal breath sounds clear to auscultation       Cardiovascular Exercise Tolerance: Good negative cardio ROS Normal cardiovascular exam Rhythm:regular Rate:Normal     Neuro/Psych negative neurological ROS  negative psych ROS   GI/Hepatic negative GI ROS, Neg liver ROS,   Endo/Other  diabetes, Oral Hypoglycemic Agents  Renal/GU Renal disease (kidney stones)  negative genitourinary   Musculoskeletal   Abdominal   Peds  Hematology negative hematology ROS (+)   Anesthesia Other Findings Past Medical History: No date: Allergy No date: Diabetes mellitus ([place]) No date: History of kidney stones No date: Hyperlipidemia No date: Renal disorder     Comment: kidney stones   Reproductive/Obstetrics negative OB ROS                            Anesthesia Physical Anesthesia Plan  ASA: II  Anesthesia Plan: General   Post-op Pain Management:    Induction:   Airway Management Planned:   Additional Equipment:   Intra-op Plan:   Post-operative Plan:   Informed Consent: I have reviewed the patients History and Physical, chart, labs and discussed the procedure including the risks, benefits and alternatives for the proposed anesthesia with the patient or authorized representative who has indicated his/her understanding and acceptance.   Dental Advisory Given  Plan Discussed with:  Anesthesiologist, CRNA and Surgeon  Anesthesia Plan Comments:         Anesthesia Quick Evaluation

## 2016-01-25 NOTE — Progress Notes (Signed)
Pt has a lot of questions about procedure and follow up care.  Paged Dr Pilar Jarvis and all pt questions were answered to pt's satisfaction. Pt has ordered dinner. Pain is more well controlled at this time. Possible Dc later tonight

## 2016-01-25 NOTE — Op Note (Signed)
Date of procedure: 01/25/16  Preoperative diagnosis:  1. Right ureteral stone  2. Bulbar urethral stricture  Postoperative diagnosis:  1. Right ureteral stone 2. Wide caliber bulbar urethral stricture   Procedure: 1. Cystoscopy 2. Right ureteroscopy 3. Laser lithotripsy 4. Stone basketing 5. Right retrograde pyelogram with interpretation 6. Right ureteral stent placement 6 French by 26 cm  Surgeon: Baruch Gouty, MD  Anesthesia: General  Complications: None  Intraoperative findings: The patient had a wide caliber bulbar urethral stricture that was easily navigated passed with the cystoscope. The stone in the right mid ureter. This was broken into small fragments. All stones greater than 1 mm were removed. Right retrograde pyelogram and the procedure so no further filling defects.  EBL: None  Specimens: Right ureteral stone  Drains: 6 French by 26 cm right double-J ureteral stent  Disposition: Stable to the postanesthesia care unit  Indication for procedure: The patient is a 45 y.o. male with a right ureteral stone and intractable pain presents today for definitive stone management.  After reviewing the management options for treatment, the patient elected to proceed with the above surgical procedure(s). We have discussed the potential benefits and risks of the procedure, side effects of the proposed treatment, the likelihood of the patient achieving the goals of the procedure, and any potential problems that might occur during the procedure or recuperation. Informed consent has been obtained.  Description of procedure: The patient was met in the preoperative area. All risks, benefits, and indications of the procedure were described in great detail. The patient consented to the procedure. Preoperative antibiotics were given. The patient was taken to the operative theater. General anesthesia was induced per the anesthesia service. The patient was then placed in the dorsal lithotomy  position and prepped and draped in the usual sterile fashion. A preoperative timeout was called.   A 21 French 30 cystoscope was inserted into the patient's bladder per urethra atraumatically. He'll likely caliber bulbar urethral stricture that was able to pass the scope past. Sensor wires placed in the right ureteral orifice up to the level of the right kidney and the fluoroscopy. Cystoscope was removed and a semirigid ureteroscope was inserted into his bladder per urethra. The right ureter was intubated and the stone was found in the mid to proximal ureter. It was broken into fragments with laser lithotripsy. Fragments were removed that were over 1 mm with a stone basket. Pan ureteroscopy was unremarkable at this time for any large fragments. Retrograde polygrams obtained which showed no filling defects in the kidney. The ureteroscope was withdrawn.. The cystoscope, 6 French by 26 cm double-J ureteral stent was placed over the sensor wire. Sensor wire was removed. This confirmed to be in the correct location curl seen in the patient's urinary bladder with direct localization and a curl in the renal pelvis on fluoroscopy. There is clear yellow urine draining from the stent. The bladder was drained. The patient was woken from anesthesia and transferred in stable condition to the post anesthesia care unit.  Plan: The patient will follow-up in one week for office cystoscopy to remove his stent.  Baruch Gouty, M.D.

## 2016-01-25 NOTE — Progress Notes (Addendum)
Discharge instructions reviewed, copy given to pt, mother at bedside. Discharged via wheelchair to car with assist

## 2016-01-25 NOTE — Consult Note (Signed)
@ENCDATE @ 10:34 AM   Alan Wise [birth year] [id number]  Referring provider: Dr. Gardiner Coins  Chief Complaint  Patient presents with  . Flank Pain    HPI: The patient is a 45 year old gentleman with a past medical history of a bulbar urethral stricture status post dilation who presented to the hospital with right flank pain. She was diagnosed the foreign half millimeter right ureteral stone. His pain was uncontrolled requiring significant IV narcotics and he is admitted to the hospital. He has not passed a stone. She denies fevers or chills. He is still having intermittent pain. He has had stones before in the past but has been able to pass spontaneously.     PMH: Past Medical History:  Diagnosis Date  . Allergy   . Diabetes mellitus ([place])   . History of kidney stones   . Hyperlipidemia   . Renal disorder    kidney stones    Surgical History: Past Surgical History:  Procedure Laterality Date  . KNEE ARTHROSCOPY W/ MENISCAL REPAIR Right 1990  . SKIN CANCER EXCISION  2014  . URETHRAL STRICTURE DILATATION    . WISDOM TOOTH EXTRACTION      Home Medications:    Medication List    TAKE these medications   cephALEXin 500 MG capsule Commonly known as:  KEFLEX Take 1 capsule (500 mg total) by mouth 3 (three) times daily.   FREESTYLE LITE test strip Generic drug:  glucose blood USE TWO TIMES DAILY TO TEST BLOOD SUGAR   metFORMIN 1000 MG tablet Commonly known as:  GLUCOPHAGE TAKE 1 TABLET (1,000 MG TOTAL) BY MOUTH DAILY WITH BREAKFAST.   oxyCODONE 5 MG immediate release tablet Commonly known as:  Oxy IR/ROXICODONE Take 1 tablet (5 mg total) by mouth every 4 (four) hours as needed for moderate pain.       Allergies: No Known Allergies  Family History: Family History  Problem Relation Age of Onset  . Hyperlipidemia Father   . Hypertension Father   . Diabetes Father   . Benign prostatic hyperplasia Father   . Stroke Maternal Grandmother   . Diabetes  Maternal Grandmother   . Stroke Paternal Grandmother   . Hypertension Paternal Grandmother   . Kidney disease Neg Hx   . Prostate cancer Neg Hx     Social History:  reports that he has never smoked. He has never used smokeless tobacco. He reports that he drinks alcohol. He reports that he does not use drugs.  ROS: 12 point ROS negative except HPI                                        Physical Exam: BP 109/65 (BP Location: Left Arm)   Pulse 69   Temp 97.7 F (36.5 C) (Oral)   Resp 18   Ht 5\' 8"  (1.727 m)   Wt 164 lb 11.2 oz (74.7 kg)   SpO2 97%   BMI 25.04 kg/m   Constitutional:  Alert and oriented, No acute distress. HEENT: [place] AT, moist mucus membranes.  Trachea midline, no masses. Cardiovascular: No clubbing, cyanosis, or edema. Respiratory: Normal respiratory effort, no increased work of breathing. GI: Abdomen is soft, nontender, nondistended, no abdominal masses GU: Right CVA ttp Skin: No rashes, bruises or suspicious lesions. Lymph: No cervical or inguinal adenopathy. Neurologic: Grossly intact, no focal deficits, moving all 4 extremities. Psychiatric: Normal mood and affect.  Laboratory  Data: Lab Results  Component Value Date   WBC 13.9 (H) 01/25/2016   HGB 14.4 01/25/2016   HCT 40.4 01/25/2016   MCV 83.6 01/25/2016   PLT 196 01/25/2016    Lab Results  Component Value Date   CREATININE 0.96 01/25/2016    No results found for: PSA  No results found for: TESTOSTERONE  Lab Results  Component Value Date   HGBA1C 10.3 (H) 11/12/2015    Urinalysis    Component Value Date/Time   COLORURINE YELLOW (A) 01/24/2016 2152   APPEARANCEUR CLEAR (A) 01/24/2016 2152   APPEARANCEUR Hazy 05/04/2012 2052   LABSPEC 1.010 01/24/2016 2152   LABSPEC 1.026 05/04/2012 2052   PHURINE 6.0 01/24/2016 2152   GLUCOSEU 150 (A) 01/24/2016 2152   GLUCOSEU Negative 05/04/2012 2052   HGBUR 3+ (A) 01/24/2016 2152   BILIRUBINUR NEGATIVE 01/24/2016 2152    BILIRUBINUR Negative 05/04/2012 2052   [place] NEGATIVE 01/24/2016 2152   PROTEINUR 30 (A) 01/24/2016 2152   NITRITE NEGATIVE 01/24/2016 2152   LEUKOCYTESUR NEGATIVE 01/24/2016 2152   LEUKOCYTESUR Negative 05/04/2012 2052    Pertinent Imaging: CLINICAL DATA:  Right flank pain for the last 90 minutes. Vomiting. History of kidney stones.  EXAM: CT ABDOMEN AND PELVIS WITHOUT CONTRAST  TECHNIQUE: Multidetector CT imaging of the abdomen and pelvis was performed following the standard protocol without IV contrast.  COMPARISON:  02/04/2015  FINDINGS: Lung bases:  Clear.  Heart normal size.  Hepatobiliary: Fatty infiltration of the liver. No liver mass or focal lesion. Normal gallbladder. No bile duct dilation.  Spleen, pancreas, adrenal glands:  Normal.  Kidneys, ureters, bladder: 4.5 mm stone lies in the proximal to mid aspect of the right ureter causing mild right hydronephrosis and hydroureter above the stone. Below the stone, ureter is decompressed. There are no additional ureteral stones. No intrarenal stones on either side. No renal masses. No left hydronephrosis. Normal left ureter. Bladder is unremarkable.  Lymph nodes:  No adenopathy.  Ascites:  None.  Gastrointestinal:  Unremarkable.  Normal appendix visualized.  Musculoskeletal: Mild degenerative changes of the thoracolumbar spine. No osteoblastic or osteolytic lesions.  IMPRESSION: 1. 4.5 mm stone in the proximal to mid right ureter causes mild right hydroureteronephrosis. 2. No other acute findings.  No intrarenal stones. 3. Hepatic steatosis.  Assessment & Plan:    1. Right ureteral stone I discussed treatment options the patient which include medical expulsive therapy and surgical intervention. Due to the patient's admission for uncontrolled pain, I have recommended that he undergo surgical extraction of the stone. We discussed cystoscopy, right ureteroscopy, laser lithotripsy, and right  ureteral stent placement. He understands the risks and benefits of this procedure. He understands the risks include but are not limited to bleeding, infection, iatrogenic injury, need for repeat procedures. He understands that the stent after the procedure. He also has a history of a urethral stricture and understands that if I need to dilate the stricture in order to access his bladder that he may require a Foley catheter after the procedure. All questions were answered. The patient is agreeable to proceeding with surgery.  Nickie Retort, MD  [facility] [address], [place], [place] [address] [phone]

## 2016-01-25 NOTE — Progress Notes (Signed)
[facility] [place] NAME: Alan Wise    MR#:  [id number]  DATE OF BIRTH:  06-27-1970  SUBJECTIVE:  Doing ok thsi am pian controlled  REVIEW OF SYSTEMS:    Review of Systems  Constitutional: Negative.  Negative for chills, fever and malaise/fatigue.  HENT: Negative.  Negative for ear discharge, ear pain, hearing loss, nosebleeds and sore throat.   Eyes: Negative.  Negative for blurred vision and pain.  Respiratory: Negative.  Negative for cough, hemoptysis, shortness of breath and wheezing.   Cardiovascular: Negative.  Negative for chest pain, palpitations and leg swelling.  Gastrointestinal: Negative.  Negative for abdominal pain, blood in stool, diarrhea, nausea and vomiting.  Genitourinary: Negative for dysuria.       Flank pain  Musculoskeletal: Negative.  Negative for back pain.  Skin: Negative.   Neurological: Negative for dizziness, tremors, speech change, focal weakness, seizures and headaches.  Endo/Heme/Allergies: Negative.  Does not bruise/bleed easily.  Psychiatric/Behavioral: Negative.  Negative for depression, hallucinations and suicidal ideas.    Tolerating Diet: NPO      DRUG ALLERGIES:  No Known Allergies  VITALS:  Blood pressure 119/89, pulse 82, temperature 97.4 F (36.3 C), resp. rate (!) 21, height 5\' 8"  (1.727 m), weight 74.7 kg (164 lb 11.2 oz), SpO2 (!) 88 %.  PHYSICAL EXAMINATION:   Physical Exam  Constitutional: He is oriented to person, place, and time and well-developed, well-nourished, and in no distress. No distress.  HENT:  Head: Normocephalic.  Eyes: No scleral icterus.  Neck: Normal range of motion. Neck supple. No JVD present. No tracheal deviation present.  Cardiovascular: Normal rate, regular rhythm and normal heart sounds.  Exam reveals no gallop and no friction rub.   No murmur heard. Pulmonary/Chest: Effort normal and breath sounds normal. No respiratory distress. He has no  wheezes. He has no rales. He exhibits no tenderness.  Abdominal: Soft. Bowel sounds are normal. He exhibits no distension and no mass. There is no tenderness. There is no rebound and no guarding.  Musculoskeletal: Normal range of motion. He exhibits no edema.  Neurological: He is alert and oriented to person, place, and time.  Skin: Skin is warm. No rash noted. No erythema.  Psychiatric: Affect and judgment normal.      LABORATORY PANEL:   CBC  Recent Labs Lab 01/25/16 0511  WBC 13.9*  HGB 14.4  HCT 40.4  PLT 196   ------------------------------------------------------------------------------------------------------------------  Chemistries   Recent Labs Lab 01/25/16 0511  NA 136  K 4.1  CL 101  CO2 28  GLUCOSE 246*  BUN 17  CREATININE 0.96  CALCIUM 8.7*   ------------------------------------------------------------------------------------------------------------------  Cardiac Enzymes  Recent Labs Lab 01/25/16 0511  TROPONINI <0.03   ------------------------------------------------------------------------------------------------------------------  RADIOLOGY:  Ct Renal Stone Study  Result Date: 01/24/2016 CLINICAL DATA:  Right flank pain for the last 90 minutes. Vomiting. History of kidney stones. EXAM: CT ABDOMEN AND PELVIS WITHOUT CONTRAST TECHNIQUE: Multidetector CT imaging of the abdomen and pelvis was performed following the standard protocol without IV contrast. COMPARISON:  02/04/2015 FINDINGS: Lung bases:  Clear.  Heart normal size. Hepatobiliary: Fatty infiltration of the liver. No liver mass or focal lesion. Normal gallbladder. No bile duct dilation. Spleen, pancreas, adrenal glands:  Normal. Kidneys, ureters, bladder: 4.5 mm stone lies in the proximal to mid aspect of the right ureter causing mild right hydronephrosis and hydroureter above the stone. Below the stone, ureter is decompressed. There are no additional ureteral stones. No  intrarenal stones on  either side. No renal masses. No left hydronephrosis. Normal left ureter. Bladder is unremarkable. Lymph nodes:  No adenopathy. Ascites:  None. Gastrointestinal:  Unremarkable.  Normal appendix visualized. Musculoskeletal: Mild degenerative changes of the thoracolumbar spine. No osteoblastic or osteolytic lesions. IMPRESSION: 1. 4.5 mm stone in the proximal to mid right ureter causes mild right hydroureteronephrosis. 2. No other acute findings.  No intrarenal stones. 3. Hepatic steatosis. Electronically Signed   By: Lajean Manes M.D.   On: 01/24/2016 20:49     ASSESSMENT AND PLAN:   45 year old male with a history of kidney stones presents with right flank pain and found to have 4.5 mm right sided kidney stone. For further details please refer the H&P.  1. Right ureteral stone: Patient was seen and evaluated by urology. Plan for surgical extraction of stone.  2. History of urethral stricture: Management as per urology. 3.. Type 2 diabetes: Patient will resume outpatient medications including metformin. He will continue to monitor blood sugars      Management plans discussed with the patient and he is in agreement.  CODE STATUS: full  TOTAL TIME TAKING CARE OF THIS PATIENT: 30 minutes.     POSSIBLE D/C today, DEPENDING ON CLINICAL CONDITION.   Leeona Mccardle M.D on 01/25/2016 at 2:15 PM  Between 7am to 6pm - Pager - [phone] After 6pm go to [email or web address] - password [email or web address] [facility]  Office  [phone]  CC: Primary care physician; Webb Silversmith, NP  Note: This dictation was prepared with Dragon dictation along with smaller phrase technology. Any transcriptional errors that result from this process are unintentional.

## 2016-01-25 NOTE — Transfer of Care (Signed)
Immediate Anesthesia Transfer of Care Note  Patient: Alan Wise  Procedure(s) Performed: Procedure(s): URETEROSCOPY WITH HOLMIUM LASER LITHOTRIPSY (Right) CYSTOSCOPY WITH STENT PLACEMENT (Right)  Patient Location: PACU  Anesthesia Type:General  Level of Consciousness: awake and sedated  Airway & Oxygen Therapy: Patient Spontanous Breathing and Patient connected to face mask oxygen  Post-op Assessment: Report given to RN and Post -op Vital signs reviewed and stable  Post vital signs: Reviewed and stable  Last Vitals:  Vitals:   01/25/16 1224 01/25/16 1347  BP:  130/80  Pulse: 74 65  Resp: 18 13  Temp: (!) 36 C 36.3 C    Last Pain:  Vitals:   01/25/16 1224  TempSrc: Oral  PainSc: 6          Complications: No apparent anesthesia complications

## 2016-01-25 NOTE — Plan of Care (Signed)
Problem: Pain Managment: Goal: General experience of comfort will improve Outcome: Progressing Pt in a lot of pain and requiring IV pain meds. Slow progression

## 2016-01-28 ENCOUNTER — Encounter: Payer: Self-pay | Admitting: Urology

## 2016-01-28 NOTE — Telephone Encounter (Signed)
I made his appt for 02-04-16 but he called me back today and cx it said he was going out of town. He pushed it out till 02-18-16.   michelle

## 2016-02-04 ENCOUNTER — Other Ambulatory Visit: Payer: BC Managed Care – PPO

## 2016-02-06 LAB — STONE ANALYSIS
Ca Oxalate,Dihydrate: 30 %
Ca Oxalate,Monohydr.: 65 %
Ca phos cry stone ql IR: 5 %
Stone Weight KSTONE: 3.1 mg

## 2016-02-13 ENCOUNTER — Other Ambulatory Visit: Payer: Self-pay | Admitting: Internal Medicine

## 2016-02-13 DIAGNOSIS — IMO0001 Reserved for inherently not codable concepts without codable children: Secondary | ICD-10-CM

## 2016-02-13 DIAGNOSIS — E1165 Type 2 diabetes mellitus with hyperglycemia: Principal | ICD-10-CM

## 2016-02-18 ENCOUNTER — Other Ambulatory Visit: Payer: BC Managed Care – PPO | Admitting: Urology

## 2016-02-18 ENCOUNTER — Encounter: Payer: Self-pay | Admitting: Urology

## 2016-02-19 ENCOUNTER — Other Ambulatory Visit (INDEPENDENT_AMBULATORY_CARE_PROVIDER_SITE_OTHER): Payer: BC Managed Care – PPO

## 2016-02-19 DIAGNOSIS — IMO0001 Reserved for inherently not codable concepts without codable children: Secondary | ICD-10-CM

## 2016-02-19 DIAGNOSIS — E1165 Type 2 diabetes mellitus with hyperglycemia: Secondary | ICD-10-CM | POA: Diagnosis not present

## 2016-02-19 LAB — MICROALBUMIN / CREATININE URINE RATIO
Creatinine,U: 241.1 mg/dL
MICROALB UR: 94.9 mg/dL — AB (ref 0.0–1.9)
MICROALB/CREAT RATIO: 39.4 mg/g — AB (ref 0.0–30.0)

## 2016-02-19 LAB — HEMOGLOBIN A1C: HEMOGLOBIN A1C: 7.9 % — AB (ref 4.6–6.5)

## 2016-02-20 ENCOUNTER — Encounter: Payer: Self-pay | Admitting: Urology

## 2016-02-20 ENCOUNTER — Ambulatory Visit (INDEPENDENT_AMBULATORY_CARE_PROVIDER_SITE_OTHER): Payer: BC Managed Care – PPO | Admitting: Urology

## 2016-02-20 VITALS — BP 156/112 | HR 64 | Ht 68.0 in | Wt 168.1 lb

## 2016-02-20 DIAGNOSIS — N201 Calculus of ureter: Secondary | ICD-10-CM

## 2016-02-20 DIAGNOSIS — N200 Calculus of kidney: Secondary | ICD-10-CM

## 2016-02-20 DIAGNOSIS — N132 Hydronephrosis with renal and ureteral calculous obstruction: Secondary | ICD-10-CM

## 2016-02-20 LAB — URINALYSIS, COMPLETE
Bilirubin, UA: NEGATIVE
LEUKOCYTES UA: NEGATIVE
NITRITE UA: NEGATIVE
SPEC GRAV UA: 1.025 (ref 1.005–1.030)
Urobilinogen, Ur: 0.2 mg/dL (ref 0.2–1.0)
pH, UA: 5.5 (ref 5.0–7.5)

## 2016-02-20 LAB — MICROSCOPIC EXAMINATION
Bacteria, UA: NONE SEEN
Epithelial Cells (non renal): NONE SEEN /hpf (ref 0–10)
RBC, UA: >30 /hpf — AB (ref 0–?)
WBC, UA: NONE SEEN /hpf (ref 0–?)

## 2016-02-20 MED ORDER — LIDOCAINE HCL 2 % EX GEL
1.0000 "application " | Freq: Once | CUTANEOUS | Status: AC
  Administered 2016-02-20: 1 via URETHRAL

## 2016-02-20 MED ORDER — CIPROFLOXACIN HCL 500 MG PO TABS
500.0000 mg | ORAL_TABLET | Freq: Once | ORAL | Status: AC
  Administered 2016-02-20: 500 mg via ORAL

## 2016-02-20 NOTE — Progress Notes (Signed)
Pt returns today for cysto stent removal following right URS and bulb sx dilation. There were no other stones on his CT. He has been well. No fever or dysuria. He's had typical stent pain with voiding.   He dilates his urethral sx with a 16 Fr catheter on a regular basis.   Procedure: cystoscopy with right stent removal-after consent was obtained patient was placed supine and prepped and draped in the usual sterile fashion. The cystoscope was passed per urethra. I was able to negotiate through a bulb sx which looked fairly dense. The right ureteral stent was grasped and removed through the urethral meatus intact. Patient tolerated the procedure well.  Assessment/plan: Right ureteral stone-assess kidneys with renal ultrasound in 4 weeks to ensure resolution of hydronephrosis.

## 2016-02-22 ENCOUNTER — Encounter: Payer: Self-pay | Admitting: Internal Medicine

## 2016-03-04 ENCOUNTER — Ambulatory Visit
Admission: RE | Admit: 2016-03-04 | Discharge: 2016-03-04 | Disposition: A | Discharge status: Home / Self Care | Payer: BC Managed Care – PPO | Source: Ambulatory Visit | Attending: Urology | Admitting: Urology

## 2016-03-04 DIAGNOSIS — N200 Calculus of kidney: Secondary | ICD-10-CM

## 2016-03-04 DIAGNOSIS — Z09 Encounter for follow-up examination after completed treatment for conditions other than malignant neoplasm: Secondary | ICD-10-CM | POA: Diagnosis not present

## 2016-03-04 DIAGNOSIS — Z87442 Personal history of urinary calculi: Secondary | ICD-10-CM | POA: Insufficient documentation

## 2016-03-19 ENCOUNTER — Ambulatory Visit (INDEPENDENT_AMBULATORY_CARE_PROVIDER_SITE_OTHER): Payer: BC Managed Care – PPO | Admitting: Urology

## 2016-03-19 ENCOUNTER — Encounter: Payer: Self-pay | Admitting: Urology

## 2016-03-19 VITALS — BP 138/88 | HR 85 | Ht 68.0 in | Wt 171.0 lb

## 2016-03-19 DIAGNOSIS — N99111 Postprocedural bulbous urethral stricture: Secondary | ICD-10-CM

## 2016-03-19 DIAGNOSIS — N201 Calculus of ureter: Secondary | ICD-10-CM

## 2016-03-19 NOTE — Progress Notes (Signed)
03/19/2016 2:17 PM   Alan Wise 05-21-1971 [id number]  Referring provider: Jearld Fenton, NP [address] [place], [place] [address]  No chief complaint on file.   HPI: 1) ureteral stone - s/p right URS/HLL 01/25/2016. There were no other stones. Stent removed on office 8/30.   2) urethral sx - pt dilates his urethral sx with a 16 Fr catheter on a regular basis (10-14 days). Dr. Donne Hazel was able to navigate the sx with the scope during URS.   Today, pt seen for the above. He's voiding with a good flow and had no flank pain or stone passage. Renal u/s was normal. I reviewed the images.   PMH: Past Medical History:  Diagnosis Date  . Allergy   . Diabetes mellitus ([place])   . History of kidney stones   . Hyperlipidemia   . Renal disorder    kidney stones    Surgical History: Past Surgical History:  Procedure Laterality Date  . CYSTOSCOPY WITH STENT PLACEMENT Right 01/25/2016   Procedure: CYSTOSCOPY WITH STENT PLACEMENT;  Surgeon: Nickie Retort, MD;  Location: [facility] ORS;  Service: Urology;  Laterality: Right;  . KNEE ARTHROSCOPY W/ MENISCAL REPAIR Right 1990  . SKIN CANCER EXCISION  2014  . URETEROSCOPY WITH HOLMIUM LASER LITHOTRIPSY Right 01/25/2016   Procedure: URETEROSCOPY WITH HOLMIUM LASER LITHOTRIPSY;  Surgeon: Nickie Retort, MD;  Location: [facility] ORS;  Service: Urology;  Laterality: Right;  . URETHRAL STRICTURE DILATATION    . WISDOM TOOTH EXTRACTION      Home Medications:    Medication List       Accurate as of 03/19/16  2:17 PM. Always use your most recent med list.          FREESTYLE LITE test strip Generic drug:  glucose blood USE TWO TIMES DAILY TO TEST BLOOD SUGAR   metFORMIN 1000 MG tablet Commonly known as:  GLUCOPHAGE TAKE 1 TABLET (1,000 MG TOTAL) BY MOUTH DAILY WITH BREAKFAST.   oxybutynin 5 MG tablet Commonly known as:  DITROPAN Take 1 tablet (5 mg total) by mouth every 8 (eight) hours as needed for bladder spasms.     oxyCODONE 5 MG immediate release tablet Commonly known as:  Oxy IR/ROXICODONE Take 1 tablet (5 mg total) by mouth every 4 (four) hours as needed for moderate pain.       Allergies: No Known Allergies  Family History: Family History  Problem Relation Age of Onset  . Hyperlipidemia Father   . Hypertension Father   . Diabetes Father   . Benign prostatic hyperplasia Father   . Stroke Maternal Grandmother   . Diabetes Maternal Grandmother   . Stroke Paternal Grandmother   . Hypertension Paternal Grandmother   . Kidney disease Neg Hx   . Prostate cancer Neg Hx     Social History:  reports that he has never smoked. He has never used smokeless tobacco. He reports that he drinks alcohol. He reports that he does not use drugs.  ROS:                                        Physical Exam: There were no vitals taken for this visit.  Constitutional:  Alert and oriented, No acute distress. HEENT: [place] AT, moist mucus membranes.  Trachea midline, no masses. Cardiovascular: No clubbing, cyanosis, or edema. Respiratory: Normal respiratory effort, no increased work of  breathing. Skin: No rashes, bruises or suspicious lesions. Neurologic: Grossly intact, no focal deficits, moving all 4 extremities. Psychiatric: Normal mood and affect.  Laboratory Data: Lab Results  Component Value Date   WBC 13.9 (H) 01/25/2016   HGB 14.4 01/25/2016   HCT 40.4 01/25/2016   MCV 83.6 01/25/2016   PLT 196 01/25/2016    Lab Results  Component Value Date   CREATININE 0.96 01/25/2016    No results found for: PSA  No results found for: TESTOSTERONE  Lab Results  Component Value Date   HGBA1C 7.9 (H) 02/19/2016    Urinalysis    Component Value Date/Time   COLORURINE YELLOW (A) 01/24/2016 2152   APPEARANCEUR Cloudy (A) 02/20/2016 1441   LABSPEC 1.010 01/24/2016 2152   LABSPEC 1.026 05/04/2012 2052   PHURINE 6.0 01/24/2016 2152   GLUCOSEU 3+ (A) 02/20/2016 1441    GLUCOSEU Negative 05/04/2012 2052   HGBUR 3+ (A) 01/24/2016 2152   BILIRUBINUR Negative 02/20/2016 1441   BILIRUBINUR Negative 05/04/2012 2052   KETONESUR NEGATIVE 01/24/2016 2152   PROTEINUR 3+ (A) 02/20/2016 1441   PROTEINUR 30 (A) 01/24/2016 2152   NITRITE Negative 02/20/2016 1441   NITRITE NEGATIVE 01/24/2016 2152   LEUKOCYTESUR Negative 02/20/2016 1441   LEUKOCYTESUR Negative 05/04/2012 2052    Pertinent Imaging: Renal u/s, CT  Assessment & Plan:   1) Ureteral stone - resolved  2) urethral sx - stable. Pt will notify us if he gets a weak stream, gross hematuria, trouble with CIC, etc.   There are no diagnoses linked to this encounter.  No Follow-up on file.  Festus Aloe, [facility] [address], [place], [place] [address] [phone]

## 2016-05-22 ENCOUNTER — Ambulatory Visit (INDEPENDENT_AMBULATORY_CARE_PROVIDER_SITE_OTHER): Payer: BC Managed Care – PPO | Admitting: Internal Medicine

## 2016-05-22 ENCOUNTER — Encounter: Payer: Self-pay | Admitting: Internal Medicine

## 2016-05-22 VITALS — BP 122/82 | HR 73 | Temp 98.8°F | Ht 68.0 in | Wt 169.5 lb

## 2016-05-22 DIAGNOSIS — IMO0001 Reserved for inherently not codable concepts without codable children: Secondary | ICD-10-CM

## 2016-05-22 DIAGNOSIS — E1165 Type 2 diabetes mellitus with hyperglycemia: Secondary | ICD-10-CM

## 2016-05-22 DIAGNOSIS — Z0000 Encounter for general adult medical examination without abnormal findings: Secondary | ICD-10-CM | POA: Diagnosis not present

## 2016-05-22 DIAGNOSIS — E7800 Pure hypercholesterolemia, unspecified: Secondary | ICD-10-CM | POA: Diagnosis not present

## 2016-05-22 LAB — LIPID PANEL
CHOL/HDL RATIO: 8
Cholesterol: 259 mg/dL — ABNORMAL HIGH (ref 0–200)
HDL: 32.8 mg/dL — ABNORMAL LOW (ref >39.00)
NonHDL: 226.62
Triglycerides: 281 mg/dL — ABNORMAL HIGH (ref 0.0–149.0)
VLDL: 56.2 mg/dL — AB (ref 0.0–40.0)

## 2016-05-22 LAB — COMPREHENSIVE METABOLIC PANEL
ALT: 96 U/L — ABNORMAL HIGH (ref 0–53)
AST: 37 U/L (ref 0–37)
Albumin: 4.7 g/dL (ref 3.5–5.2)
Alkaline Phosphatase: 108 U/L (ref 39–117)
BUN: 14 mg/dL (ref 6–23)
CHLORIDE: 99 meq/L (ref 96–112)
CO2: 33 mEq/L — ABNORMAL HIGH (ref 19–32)
Calcium: 9.9 mg/dL (ref 8.4–10.5)
Creatinine, Ser: 0.95 mg/dL (ref 0.40–1.50)
GFR: 91.49 mL/min (ref >60.00)
GLUCOSE: 196 mg/dL — AB (ref 70–99)
POTASSIUM: 4.4 meq/L (ref 3.5–5.1)
SODIUM: 137 meq/L (ref 135–145)
TOTAL PROTEIN: 7.6 g/dL (ref 6.0–8.3)
Total Bilirubin: 0.7 mg/dL (ref 0.2–1.2)

## 2016-05-22 LAB — MICROALBUMIN / CREATININE URINE RATIO
CREATININE, U: 304.5 mg/dL
MICROALB UR: 5.3 mg/dL — AB (ref 0.0–1.9)
MICROALB/CREAT RATIO: 1.7 mg/g (ref 0.0–30.0)

## 2016-05-22 LAB — LDL CHOLESTEROL, DIRECT: LDL DIRECT: 189 mg/dL

## 2016-05-22 LAB — CBC
HEMATOCRIT: 50.5 % (ref 39.0–52.0)
Hemoglobin: 17.4 g/dL — ABNORMAL HIGH (ref 13.0–17.0)
MCHC: 34.5 g/dL (ref 30.0–36.0)
MCV: 84.8 fl (ref 78.0–100.0)
Platelets: 262 10*3/uL (ref 150.0–400.0)
RBC: 5.96 Mil/uL — ABNORMAL HIGH (ref 4.22–5.81)
RDW: 12.5 % (ref 11.5–15.5)
WBC: 8.4 10*3/uL (ref 4.0–10.5)

## 2016-05-22 LAB — HEMOGLOBIN A1C: HEMOGLOBIN A1C: 9.7 % — AB (ref 4.6–6.5)

## 2016-05-22 NOTE — Progress Notes (Signed)
Subjective:    Patient ID: Alan Wise, male    DOB: 02-Feb-1971, 45 y.o.   MRN: [id number]  HPI  Pt presents to the clinic today for his annual exam. He is also due for follow up of chronic conditions.  DM 2: His last A1C was  7.9%, 01/2016. He does not check his sugars. He takes his Metformin rarely because of GI upset. He refused to take Lisinopril for kidney protection. He denies blurred vision, increase thirst, urinary frequency or numbness and tingling in his hands and feet. He does not take flu or pneumonia vaccines. His last eye exam was > 5 years ago.  HLD: His last LDL was 168, 10/2015. He refused to take cholesterol lowering medication. He does not consume a low fat diet.  Flu: never Pneumovax: never Tetanus: unsure of his last shot Vision Screening: > 5 years ago Dentist: biannually.  Diet: He does eat meat. He does not eat a whole lot of fruits or veggies. He does eat fried foods. He drinks water, Mt. Dew and Coke. Exercise: None  Review of Systems      Past Medical History:  Diagnosis Date  . Allergy   . Diabetes mellitus ([place])   . History of kidney stones   . Hyperlipidemia   . Renal disorder    kidney stones    Current Outpatient Prescriptions  Medication Sig Dispense Refill  . FREESTYLE LITE test strip USE TWO TIMES DAILY TO TEST BLOOD SUGAR 50 each 11  . metFORMIN (GLUCOPHAGE) 1000 MG tablet TAKE 1 TABLET (1,000 MG TOTAL) BY MOUTH DAILY WITH BREAKFAST. 90 tablet 1   No current facility-administered medications for this visit.     No Known Allergies  Family History  Problem Relation Age of Onset  . Hyperlipidemia Father   . Hypertension Father   . Diabetes Father   . Benign prostatic hyperplasia Father   . Stroke Maternal Grandmother   . Diabetes Maternal Grandmother   . Stroke Paternal Grandmother   . Hypertension Paternal Grandmother   . Kidney disease Neg Hx   . Prostate cancer Neg Hx     Social History   Social History  .  Marital status: Single    Spouse name: N/A  . Number of children: N/A  . Years of education: N/A   Occupational History  . Not on file.   Social History Main Topics  . Smoking status: Never Smoker  . Smokeless tobacco: Never Used  . Alcohol use 0.0 oz/week     Comment: rare  . Drug use: No  . Sexual activity: Not on file   Other Topics Concern  . Not on file   Social History Narrative  . No narrative on file     Constitutional: Denies fever, malaise, fatigue, headache or abrupt weight changes.  HEENT: Denies eye pain, eye redness, ear pain, ringing in the ears, wax buildup, runny nose, nasal congestion, bloody nose, or sore throat. Respiratory: Denies difficulty breathing, shortness of breath, cough or sputum production.   Cardiovascular: Denies chest pain, chest tightness, palpitations or swelling in the hands or feet.  Gastrointestinal: Pt reports intermittent diarrhea. Denies abdominal pain, bloating, constipation, or blood in the stool.  GU: Denies urgency, frequency, pain with urination, burning sensation, blood in urine, odor or discharge. Musculoskeletal: Denies decrease in range of motion, difficulty with gait, muscle pain or joint pain and swelling.  Skin: Denies redness, rashes, lesions or ulcercations.  Neurological: Denies dizziness, difficulty with memory, difficulty with  speech or problems with balance and coordination.  Psych: Denies anxiety, depression, SI/HI.  No other specific complaints in a complete review of systems (except as listed in HPI above).  Objective:   Physical Exam  BP 122/82   Pulse 73   Temp 98.8 F (37.1 C) (Oral)   Ht 5\' 8"  (1.727 m)   Wt 169 lb 8 oz (76.9 kg)   SpO2 98%   BMI 25.77 kg/m  Wt Readings from Last 3 Encounters:  05/22/16 169 lb 8 oz (76.9 kg)  03/19/16 171 lb (77.6 kg)  02/20/16 168 lb 1.6 oz (76.2 kg)    General: Appears his stated age, well developed, well nourished in NAD. Skin: Warm, dry and intact. No  rashes, lesions or ulcerations noted. HEENT: Head: normal shape and size; Eyes: sclera white, no icterus, conjunctiva pink, PERRLA and EOMs intact; Ears: Tm's gray and intact, normal light reflex; Throat/Mouth: Teeth present, mucosa pink and moist, no exudate, lesions or ulcerations noted.  Neck:  Neck supple, trachea midline. No masses, lumps or thyromegaly present.  Cardiovascular: Normal rate and rhythm. S1,S2 noted.  No murmur, rubs or gallops noted. No JVD or BLE edema. No carotid bruits noted. Pulmonary/Chest: Normal effort and positive vesicular breath sounds. No respiratory distress. No wheezes, rales or ronchi noted.  Abdomen: Soft and nontender. Normal bowel sounds. No distention or masses noted. Liver, spleen and kidneys non palpable. Musculoskeletal: Normal range of motion. No signs of joint swelling. Strength 5/5 BUE/BLE. No difficulty with gait.  Neurological: Alert and oriented. Cranial nerves II-XII grossly intact. Coordination normal.  Psychiatric: Mood and affect normal. Behavior is normal. Judgment and thought content normal.     BMET    Component Value Date/Time   NA 136 01/25/2016 0511   NA 138 05/04/2012 1815   K 4.1 01/25/2016 0511   K 3.5 05/04/2012 1815   CL 101 01/25/2016 0511   CL 99 05/04/2012 1815   CO2 28 01/25/2016 0511   CO2 29 05/04/2012 1815   GLUCOSE 246 (H) 01/25/2016 0511   GLUCOSE 69 05/04/2012 1815   BUN 17 01/25/2016 0511   BUN 12 05/04/2012 1815   CREATININE 0.96 01/25/2016 0511   CREATININE 1.06 05/04/2012 1815   CALCIUM 8.7 (L) 01/25/2016 0511   CALCIUM 9.2 05/04/2012 1815   GFRNONAA >60 01/25/2016 0511   GFRNONAA >60 05/04/2012 1815   GFRAA >60 01/25/2016 0511   GFRAA >60 05/04/2012 1815    Lipid Panel     Component Value Date/Time   CHOL 234 (H) 11/12/2015 1207   TRIG 231.0 (H) 11/12/2015 1207   HDL 27.90 (L) 11/12/2015 1207   CHOLHDL 8 11/12/2015 1207   VLDL 46.2 (H) 11/12/2015 1207    CBC    Component Value Date/Time    WBC 13.9 (H) 01/25/2016 0511   RBC 4.83 01/25/2016 0511   HGB 14.4 01/25/2016 0511   HGB 16.4 05/04/2012 1815   HCT 40.4 01/25/2016 0511   HCT 48.4 05/04/2012 1815   PLT 196 01/25/2016 0511   PLT 261 05/04/2012 1815   MCV 83.6 01/25/2016 0511   MCV 86 05/04/2012 1815   MCH 29.9 01/25/2016 0511   MCHC 35.7 01/25/2016 0511   RDW 12.6 01/25/2016 0511   RDW 12.7 05/04/2012 1815   LYMPHSABS 2.4 01/18/2015 0713   MONOABS 0.5 01/18/2015 0713   EOSABS 0.2 01/18/2015 0713   BASOSABS 0.1 01/18/2015 0713    Hgb A1C Lab Results  Component Value Date   HGBA1C 7.9 (H)  02/19/2016        Assessment & Plan:   Preventative Health Maintenance:  He declines flu, tetanus and pneumovax Encouraged him to consume a balanced diet and exercise regimen Advised him to see an eye doctor and dentist annually Will check CBC, CMET, Lipid and A1C today He declines HIV  RTC in 3 months to follow up DM 2 BAITY, REGINA, NP

## 2016-05-22 NOTE — Patient Instructions (Signed)

## 2016-05-23 MED ORDER — GLIPIZIDE 10 MG PO TABS: 10.0000 mg | ORAL_TABLET | Freq: Two times a day (BID) | ORAL | 2 refills | Status: DC

## 2016-05-23 NOTE — Assessment & Plan Note (Signed)
Uncontrolled due to noncompliance Repeat A1C today He declines ACEI/ARB therapy for microalbuminemia He declines immunizations Foot exam today Encouraged him to consume a low carb, low fat diet and exercise Stop Metformin, will start Glipizide, eRx to pharmacy

## 2016-05-23 NOTE — Assessment & Plan Note (Signed)
Uncontrolled due to noncompliance Refuses cholesterol lowering medication Encouraged him to consume a low fat diet

## 2016-05-28 ENCOUNTER — Encounter: Payer: Self-pay | Admitting: Internal Medicine

## 2016-05-30 ENCOUNTER — Other Ambulatory Visit: Payer: Self-pay | Admitting: Internal Medicine

## 2016-05-30 MED ORDER — SIMVASTATIN 20 MG PO TABS: 20.0000 mg | ORAL_TABLET | Freq: Every day | ORAL | 2 refills | Status: DC

## 2016-07-22 ENCOUNTER — Encounter: Payer: Self-pay | Admitting: Emergency Medicine

## 2016-07-22 ENCOUNTER — Emergency Department: Payer: BC Managed Care – PPO

## 2016-07-22 DIAGNOSIS — Z7984 Long term (current) use of oral hypoglycemic drugs: Secondary | ICD-10-CM | POA: Diagnosis not present

## 2016-07-22 DIAGNOSIS — R509 Fever, unspecified: Secondary | ICD-10-CM | POA: Diagnosis present

## 2016-07-22 DIAGNOSIS — J111 Influenza due to unidentified influenza virus with other respiratory manifestations: Secondary | ICD-10-CM | POA: Insufficient documentation

## 2016-07-22 DIAGNOSIS — Z79899 Other long term (current) drug therapy: Secondary | ICD-10-CM | POA: Insufficient documentation

## 2016-07-22 DIAGNOSIS — E119 Type 2 diabetes mellitus without complications: Secondary | ICD-10-CM | POA: Insufficient documentation

## 2016-07-22 LAB — CBC
HCT: 47.2 % (ref 40.0–52.0)
Hemoglobin: 16.4 g/dL (ref 13.0–18.0)
MCH: 29 pg (ref 26.0–34.0)
MCHC: 34.7 g/dL (ref 32.0–36.0)
MCV: 83.5 fL (ref 80.0–100.0)
Platelets: 193 10*3/uL (ref 150–440)
RBC: 5.65 MIL/uL (ref 4.40–5.90)
RDW: 12.8 % (ref 11.5–14.5)
WBC: 6.9 10*3/uL (ref 3.8–10.6)

## 2016-07-22 IMAGING — CR DG CHEST 2V
1 series · 1 of 1 positions shown · non-contrast
Comparison: None.

CLINICAL DATA: Chest pain yesterday.  Fever today.

EXAM:
CHEST  2 VIEW

[chest lat]
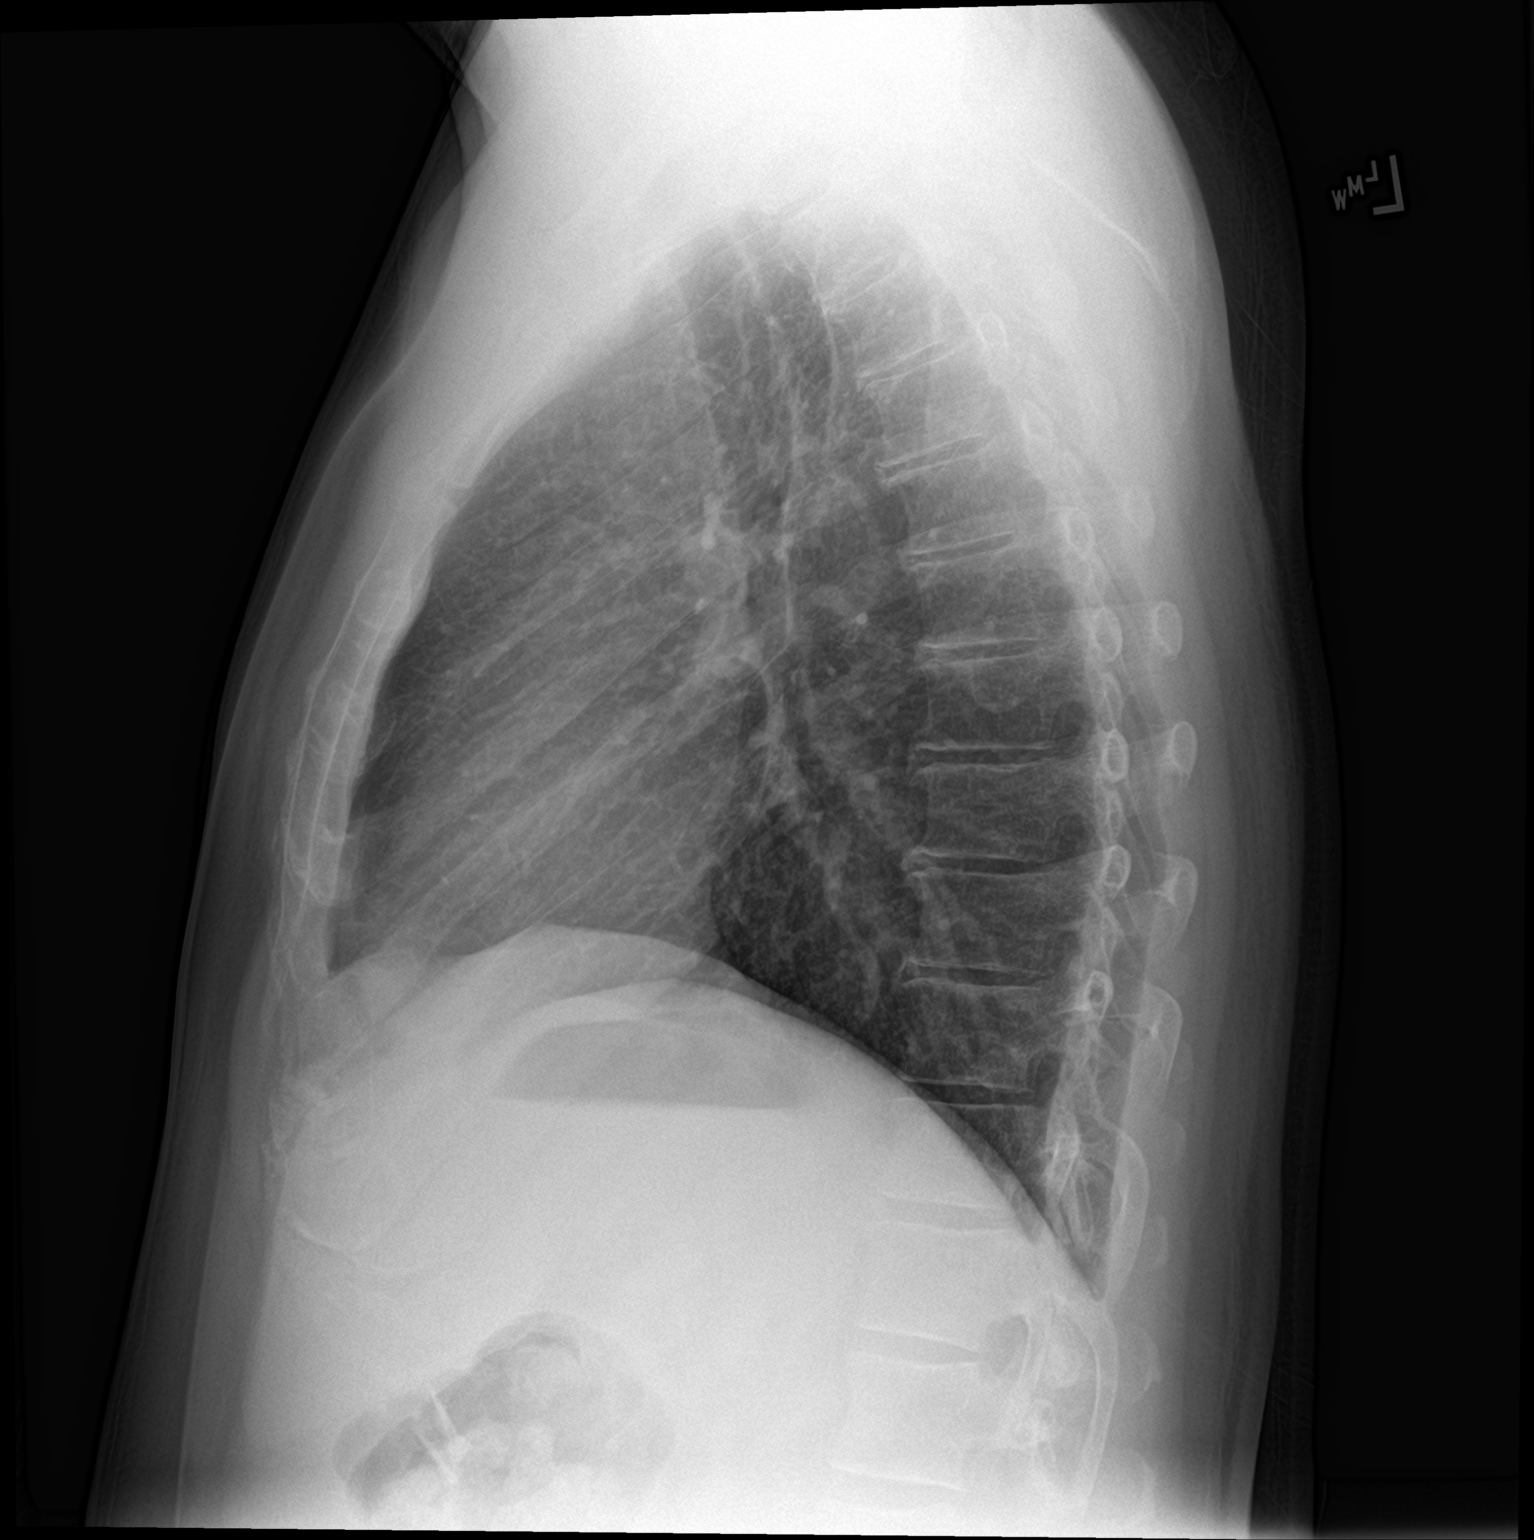

[1 of 1 positions shown; findings below may reference images not displayed]

FINDINGS: The heart size and mediastinal contours are within normal limits.
Both lungs are clear. The visualized skeletal structures are
unremarkable.
IMPRESSION: No active cardiopulmonary disease.

## 2016-07-22 MED ORDER — ACETAMINOPHEN 500 MG PO TABS
ORAL_TABLET | ORAL | Status: AC
  Administered 2016-07-22: 1000 mg via ORAL
  Filled 2016-07-22: qty 2

## 2016-07-22 MED ORDER — ACETAMINOPHEN 500 MG PO TABS
1000.0000 mg | ORAL_TABLET | Freq: Once | ORAL | Status: AC
  Administered 2016-07-22: 1000 mg via ORAL

## 2016-07-22 NOTE — ED Triage Notes (Addendum)
Pt to triage via w/c with no distress noted; reports since yesterday having pain to chest, back and neck; Today began with fever and now with nausea and cough; mask placed on pt

## 2016-07-23 ENCOUNTER — Emergency Department
Admission: EM | Admit: 2016-07-23 | Discharge: 2016-07-23 | Disposition: A | Discharge status: Home / Self Care | Payer: BC Managed Care – PPO | Attending: Emergency Medicine | Admitting: Emergency Medicine

## 2016-07-23 DIAGNOSIS — J111 Influenza due to unidentified influenza virus with other respiratory manifestations: Secondary | ICD-10-CM

## 2016-07-23 LAB — BASIC METABOLIC PANEL
Anion gap: 9 (ref 5–15)
BUN: 11 mg/dL (ref 6–20)
CALCIUM: 9 mg/dL (ref 8.9–10.3)
CO2: 23 mmol/L (ref 22–32)
CREATININE: 0.94 mg/dL (ref 0.61–1.24)
Chloride: 100 mmol/L — ABNORMAL LOW (ref 101–111)
Glucose, Bld: 225 mg/dL — ABNORMAL HIGH (ref 65–99)
Potassium: 3.8 mmol/L (ref 3.5–5.1)
SODIUM: 132 mmol/L — AB (ref 135–145)

## 2016-07-23 LAB — TROPONIN I: Troponin I: <0.03 ng/mL (ref <0.03)

## 2016-07-23 LAB — INFLUENZA PANEL BY PCR (TYPE A & B)
INFLAPCR: POSITIVE — AB
INFLBPCR: NEGATIVE

## 2016-07-23 MED ORDER — OSELTAMIVIR PHOSPHATE 75 MG PO CAPS: 75.0000 mg | ORAL_CAPSULE | Freq: Two times a day (BID) | ORAL | 0 refills | Status: AC

## 2016-07-23 MED ORDER — OSELTAMIVIR PHOSPHATE 75 MG PO CAPS
75.0000 mg | ORAL_CAPSULE | Freq: Once | ORAL | Status: AC
Start: 2016-07-23 — End: 2016-07-23
  Administered 2016-07-23: 75 mg via ORAL
  Filled 2016-07-23: qty 1

## 2016-07-23 NOTE — ED Provider Notes (Addendum)
[facility] Emergency Department Provider Note   First MD Initiated Contact with Patient 07/23/16 0301     (approximate)  I have reviewed the triage vital signs and the nursing notes.   HISTORY  Chief Complaint Fever and Chest Pain    HPI Alan Wise is a 46 y.o. male with below list of chronic medical conditions presents with one-day history of pain in the chest neck and back as well as fever nausea and cough. Patient afebrile on presentation to emergency department a temperature 102.3.   Past Medical History:  Diagnosis Date  . Allergy   . Diabetes mellitus ([place])   . History of kidney stones   . Hyperlipidemia   . Renal disorder    kidney stones    Patient Active Problem List   Diagnosis Date Noted  . Kidney stone 01/24/2016  . Stricture, urethra 08/17/2014  . Type 2 diabetes mellitus with hyperglycemia ([place]) 06/12/2014  . HLD (hyperlipidemia) 06/12/2014    Past Surgical History:  Procedure Laterality Date  . CYSTOSCOPY WITH STENT PLACEMENT Right 01/25/2016   Procedure: CYSTOSCOPY WITH STENT PLACEMENT;  Surgeon: Nickie Retort, MD;  Location: [facility] ORS;  Service: Urology;  Laterality: Right;  . KNEE ARTHROSCOPY W/ MENISCAL REPAIR Right 1990  . SKIN CANCER EXCISION  2014  . URETEROSCOPY WITH HOLMIUM LASER LITHOTRIPSY Right 01/25/2016   Procedure: URETEROSCOPY WITH HOLMIUM LASER LITHOTRIPSY;  Surgeon: Nickie Retort, MD;  Location: [facility] ORS;  Service: Urology;  Laterality: Right;  . URETHRAL STRICTURE DILATATION    . WISDOM TOOTH EXTRACTION      Prior to Admission medications   Medication Sig Start Date End Date Taking? Authorizing Provider  FREESTYLE LITE test strip USE TWO TIMES DAILY TO TEST BLOOD SUGAR 11/26/15   Jearld Fenton, NP  glipiZIDE (GLUCOTROL) 10 MG tablet Take 1 tablet (10 mg total) by mouth 2 (two) times daily before a meal. 05/23/16   Jearld Fenton, NP  simvastatin (ZOCOR) 20 MG tablet Take 1 tablet (20 mg total)  by mouth at bedtime. 05/30/16   Jearld Fenton, NP    Allergies Patient has no known allergies.  Family History  Problem Relation Age of Onset  . Hyperlipidemia Father   . Hypertension Father   . Diabetes Father   . Benign prostatic hyperplasia Father   . Stroke Maternal Grandmother   . Diabetes Maternal Grandmother   . Stroke Paternal Grandmother   . Hypertension Paternal Grandmother   . Kidney disease Neg Hx   . Prostate cancer Neg Hx     Social History Social History  Substance Use Topics  . Smoking status: Never Smoker  . Smokeless tobacco: Never Used  . Alcohol use 0.0 oz/week     Comment: rare    Review of Systems Constitutional:Positive for fever/chills Eyes: No visual changes. ENT: No sore throat. Positive for congestion Cardiovascular: Denies chest pain. Respiratory: Denies shortness of breath. Positive for cough Gastrointestinal: No abdominal pain.  No nausea, no vomiting.  No diarrhea.  No constipation. Genitourinary: Negative for dysuria. Musculoskeletal: Negative for back pain. Skin: Negative for rash. Neurological: Negative for headaches, focal weakness or numbness.  10-point ROS otherwise negative.  ____________________________________________   PHYSICAL EXAM:  VITAL SIGNS: ED Triage Vitals  Enc Vitals Group     BP 07/22/16 2355 115/83     Pulse Rate 07/22/16 2355 (!) 126     Resp 07/22/16 2355 20     Temp 07/22/16 2355 (!) 102.3  F (39.1 C)     Temp Source 07/22/16 2355 Oral     SpO2 07/22/16 2355 98 %     Weight 07/22/16 2341 174 lb (78.9 kg)     Height 07/22/16 2341 5\' 8"  (1.727 m)     Head Circumference --      Peak Flow --      Pain Score 07/22/16 2341 5     Pain Loc --      Pain Edu? --      Excl. in [place]? --     Constitutional: Alert and oriented. Well appearing and in no acute distress. Eyes: Conjunctivae are normal. PERRL. EOMI. Head: Atraumatic. Nose: No congestion/rhinnorhea. Mouth/Throat: Mucous membranes are moist.   Oropharynx non-erythematous. Neck: No stridor.   Cardiovascular: Normal rate, regular rhythm. Good peripheral circulation. Grossly normal heart sounds. Respiratory: Normal respiratory effort.  No retractions. Lungs CTAB. Musculoskeletal: No lower extremity tenderness nor edema. No gross deformities of extremities. Neurologic:  Normal speech and language. No gross focal neurologic deficits are appreciated.  Skin:  Skin is warm, dry and intact. No rash noted.   ____________________________________________   LABS (all labs ordered are listed, but only abnormal results are displayed)  Labs Reviewed  BASIC METABOLIC PANEL - Abnormal; Notable for the following:       Result Value   Sodium 132 (*)    Chloride 100 (*)    Glucose, Bld 225 (*)    All other components within normal limits  INFLUENZA PANEL BY PCR (TYPE A & B) - Abnormal; Notable for the following:    Influenza A By PCR POSITIVE (*)    All other components within normal limits  CBC  TROPONIN I    RADIOLOGY I, [place] N BROWN, personally viewed and evaluated these images (plain radiographs) as part of my medical decision making, as well as reviewing the written report by the radiologist.  Dg Chest 2 View  Result Date: 07/22/2016 CLINICAL DATA:  Chest pain yesterday.  Fever today. EXAM: CHEST  2 VIEW COMPARISON:  None. FINDINGS: The heart size and mediastinal contours are within normal limits. Both lungs are clear. The visualized skeletal structures are unremarkable. IMPRESSION: No active cardiopulmonary disease. Electronically Signed   By: Andreas Newport M.D.   On: 07/22/2016 23:58   ED ECG REPORT I, [place] N BROWN, the attending physician, personally viewed and interpreted this ECG.   Date: 07/23/2016  EKG Time: 11:46 PM  Rate: 124  Rhythm: Sinus tachycardia  Axis: Normal  Intervals: Normal  ST&T Change: None   Procedures      INITIAL IMPRESSION / ASSESSMENT AND PLAN / ED COURSE  Pertinent labs &  imaging results that were available during my care of the patient were reviewed by me and considered in my medical decision making (see chart for details).  I spoke with the patient at length regarding Tamiflu and its potential side effects including exacerbation diabetes mellitus. Patient is requesting Tamiflu at this time and as such will be prescribed      ____________________________________________  FINAL CLINICAL IMPRESSION(S) / ED DIAGNOSES  Final diagnoses:  Influenza     MEDICATIONS GIVEN DURING THIS VISIT:  Medications  oseltamivir (TAMIFLU) capsule 75 mg (not administered)  acetaminophen (TYLENOL) tablet 1,000 mg (1,000 mg Oral Given 07/22/16 2347)     NEW OUTPATIENT MEDICATIONS STARTED DURING THIS VISIT:  New Prescriptions   No medications on file    Modified Medications   No medications on file    Discontinued  Medications   No medications on file     Note:  This document was prepared using Dragon voice recognition software and may include unintentional dictation errors.    Gregor Hams, MD 07/23/16 [id number]    Gregor Hams, MD 07/23/16 [phone]

## 2016-07-23 NOTE — ED Notes (Signed)
Pt discharged to home.  Family member driving.  Discharge instructions reviewed.  Verbalized understanding.  No questions or concerns at this time.  Teach back verified.  Pt in NAD.  No items left in ED.   

## 2016-08-19 ENCOUNTER — Encounter: Payer: Self-pay | Admitting: Internal Medicine

## 2016-08-19 ENCOUNTER — Ambulatory Visit (INDEPENDENT_AMBULATORY_CARE_PROVIDER_SITE_OTHER): Payer: BC Managed Care – PPO | Admitting: Internal Medicine

## 2016-08-19 VITALS — BP 124/88 | HR 70 | Temp 98.2°F | Wt 175.5 lb

## 2016-08-19 DIAGNOSIS — E1165 Type 2 diabetes mellitus with hyperglycemia: Secondary | ICD-10-CM | POA: Diagnosis not present

## 2016-08-19 DIAGNOSIS — E7800 Pure hypercholesterolemia, unspecified: Secondary | ICD-10-CM | POA: Diagnosis not present

## 2016-08-19 LAB — COMPREHENSIVE METABOLIC PANEL
ALBUMIN: 4.4 g/dL (ref 3.5–5.2)
ALK PHOS: 103 U/L (ref 39–117)
ALT: 57 U/L — ABNORMAL HIGH (ref 0–53)
AST: 35 U/L (ref 0–37)
BUN: 13 mg/dL (ref 6–23)
CO2: 33 mEq/L — ABNORMAL HIGH (ref 19–32)
Calcium: 9.6 mg/dL (ref 8.4–10.5)
Chloride: 98 mEq/L (ref 96–112)
Creatinine, Ser: 0.84 mg/dL (ref 0.40–1.50)
GFR: 105.33 mL/min (ref >60.00)
Glucose, Bld: 148 mg/dL — ABNORMAL HIGH (ref 70–99)
POTASSIUM: 4.5 meq/L (ref 3.5–5.1)
Sodium: 135 mEq/L (ref 135–145)
TOTAL PROTEIN: 7 g/dL (ref 6.0–8.3)
Total Bilirubin: 0.6 mg/dL (ref 0.2–1.2)

## 2016-08-19 LAB — LIPID PANEL
CHOLESTEROL: 179 mg/dL (ref 0–200)
HDL: 29.3 mg/dL — AB (ref >39.00)
LDL Cholesterol: 113 mg/dL — ABNORMAL HIGH (ref 0–99)
NonHDL: 149.9
TRIGLYCERIDES: 184 mg/dL — AB (ref 0.0–149.0)
Total CHOL/HDL Ratio: 6
VLDL: 36.8 mg/dL (ref 0.0–40.0)

## 2016-08-19 LAB — HEMOGLOBIN A1C: HEMOGLOBIN A1C: 9.2 % — AB (ref 4.6–6.5)

## 2016-08-19 NOTE — Progress Notes (Signed)
Subjective:    Patient ID: Alan Wise, male    DOB: [birth year], 46 y.o.   MRN: [id number]  HPI  Pt presents to the clinic today to follow up DM 2 and HLD.  DM 2: His last A1C was 9.7%, 04/2016. He was started on Glipizide 10 mg BID. He has had some issues with constipation. He had a positive microalbumin but refused to start an ACEI/ARB. He sugars range 170-250. His last eye exam was > 2 years ago. He refuses immunizations.  HLD: His last LDL was 189, 04/2016. He reluctantly agreed to starting Zocor at that time. He has been taking it as prescribed. He denies myalgias. He tries to consume a low fat diet.   Review of Systems      Past Medical History:  Diagnosis Date  . Allergy   . Diabetes mellitus ([place])   . History of kidney stones   . Hyperlipidemia   . Renal disorder    kidney stones    Current Outpatient Prescriptions  Medication Sig Dispense Refill  . FREESTYLE LITE test strip USE TWO TIMES DAILY TO TEST BLOOD SUGAR 50 each 11  . glipiZIDE (GLUCOTROL) 10 MG tablet Take 1 tablet (10 mg total) by mouth 2 (two) times daily before a meal. 60 tablet 2  . simvastatin (ZOCOR) 20 MG tablet Take 1 tablet (20 mg total) by mouth at bedtime. 30 tablet 2   No current facility-administered medications for this visit.     No Known Allergies  Family History  Problem Relation Age of Onset  . Hyperlipidemia Father   . Hypertension Father   . Diabetes Father   . Benign prostatic hyperplasia Father   . Stroke Maternal Grandmother   . Diabetes Maternal Grandmother   . Stroke Paternal Grandmother   . Hypertension Paternal Grandmother   . Kidney disease Neg Hx   . Prostate cancer Neg Hx     Social History   Social History  . Marital status: Single    Spouse name: N/A  . Number of children: N/A  . Years of education: N/A   Occupational History  . Not on file.   Social History Main Topics  . Smoking status: Never Smoker  . Smokeless tobacco: Never Used  .  Alcohol use 0.0 oz/week     Comment: rare  . Drug use: No  . Sexual activity: Not on file   Other Topics Concern  . Not on file   Social History Narrative  . No narrative on file     Constitutional: Pt reports fatigue. Denies fever, malaise, headache or abrupt weight changes.  Respiratory: Denies difficulty breathing, shortness of breath, cough or sputum production.   Cardiovascular: Denies chest pain, chest tightness, palpitations or swelling in the hands or feet.  Gastrointestinal: Denies abdominal pain, bloating, constipation, diarrhea or blood in the stool.  GU: Denies urgency, frequency, pain with urination, burning sensation, blood in urine, odor or discharge. Skin: Denies redness, rashes, lesions or ulcercations.   No other specific complaints in a complete review of systems (except as listed in HPI above).  Objective:   Physical Exam   BP 124/88   Pulse 70   Temp 98.2 F (36.8 C) (Oral)   Wt 175 lb 8 oz (79.6 kg)   SpO2 98%   BMI 26.68 kg/m  Wt Readings from Last 3 Encounters:  08/19/16 175 lb 8 oz (79.6 kg)  07/22/16 174 lb (78.9 kg)  05/22/16 169 lb 8 oz (76.9 kg)  General: Appears his stated age, well developed, well nourished in NAD. Skin: Warm, dry and intact. No  ulcerations noted. Cardiovascular: Normal rate and rhythm. S1,S2 noted.  No murmur, rubs or gallops noted.  Pulmonary/Chest: Normal effort and positive vesicular breath sounds. No respiratory distress. No wheezes, rales or ronchi noted.  Neurological: Alert and oriented. Sensation intact to BLE.   BMET    Component Value Date/Time   NA 132 (L) 07/22/2016 2342   NA 138 05/04/2012 1815   K 3.8 07/22/2016 2342   K 3.5 05/04/2012 1815   CL 100 (L) 07/22/2016 2342   CL 99 05/04/2012 1815   CO2 23 07/22/2016 2342   CO2 29 05/04/2012 1815   GLUCOSE 225 (H) 07/22/2016 2342   GLUCOSE 69 05/04/2012 1815   BUN 11 07/22/2016 2342   BUN 12 05/04/2012 1815   CREATININE 0.94 07/22/2016 2342    CREATININE 1.06 05/04/2012 1815   CALCIUM 9.0 07/22/2016 2342   CALCIUM 9.2 05/04/2012 1815   GFRNONAA >60 07/22/2016 2342   GFRNONAA >60 05/04/2012 1815   GFRAA >60 07/22/2016 2342   GFRAA >60 05/04/2012 1815    Lipid Panel     Component Value Date/Time   CHOL 259 (H) 05/22/2016 1445   TRIG 281.0 (H) 05/22/2016 1445   HDL 32.80 (L) 05/22/2016 1445   CHOLHDL 8 05/22/2016 1445   VLDL 56.2 (H) 05/22/2016 1445    CBC    Component Value Date/Time   WBC 6.9 07/22/2016 2342   RBC 5.65 07/22/2016 2342   HGB 16.4 07/22/2016 2342   HGB 16.4 05/04/2012 1815   HCT 47.2 07/22/2016 2342   HCT 48.4 05/04/2012 1815   PLT 193 07/22/2016 2342   PLT 261 05/04/2012 1815   MCV 83.5 07/22/2016 2342   MCV 86 05/04/2012 1815   MCH 29.0 07/22/2016 2342   MCHC 34.7 07/22/2016 2342   RDW 12.8 07/22/2016 2342   RDW 12.7 05/04/2012 1815   LYMPHSABS 2.4 01/18/2015 0713   MONOABS 0.5 01/18/2015 0713   EOSABS 0.2 01/18/2015 0713   BASOSABS 0.1 01/18/2015 0713    Hgb A1C Lab Results  Component Value Date   HGBA1C 9.7 (H) 05/22/2016           Assessment & Plan:

## 2016-08-19 NOTE — Assessment & Plan Note (Signed)
Noncompliant with diet or exercise Will check A1C today Continue Glipizide for now He refuses ACEI/ARB therapy He declines immunizations Offered referral for eye exam, he declines

## 2016-08-19 NOTE — Patient Instructions (Signed)
Fat and Cholesterol Restricted Diet Getting too much fat and cholesterol in your diet may cause health problems. Following this diet helps keep your fat and cholesterol at normal levels. This can keep you from getting sick. What types of fat should I choose?  Choose monosaturated and polyunsaturated fats. These are found in foods such as olive oil, canola oil, flaxseeds, walnuts, almonds, and seeds.  Eat more omega-3 fats. Good choices include salmon, mackerel, sardines, tuna, flaxseed oil, and ground flaxseeds.  Limit saturated fats. These are in animal products such as meats, butter, and cream. They can also be in plant products such as palm oil, palm kernel oil, and coconut oil.  Avoid foods with partially hydrogenated oils in them. These contain trans fats. Examples of foods that have trans fats are stick margarine, some tub margarines, cookies, crackers, and other baked goods. What general guidelines do I need to follow?  Check food labels. Look for the words "trans fat" and "saturated fat."  When preparing a meal:  Fill half of your plate with vegetables and green salads.  Fill one fourth of your plate with whole grains. Look for the word "whole" as the first word in the ingredient list.  Fill one fourth of your plate with lean protein foods.  Eat more foods that have fiber, like apples, carrots, beans, peas, and barley.  Eat more home-cooked foods. Eat less at restaurants and buffets.  Limit or avoid alcohol.  Limit foods high in starch and sugar.  Limit fried foods.  Cook foods without frying them. Baking, boiling, grilling, and broiling are all great options.  Lose weight if you are overweight. Losing even a small amount of weight can help your overall health. It can also help prevent diseases such as diabetes and heart disease. What foods can I eat? Grains  Whole grains, such as whole wheat or whole grain breads, crackers, cereals, and pasta. Unsweetened oatmeal,  bulgur, barley, quinoa, or brown rice. Corn or whole wheat flour tortillas. Vegetables  Fresh or frozen vegetables (raw, steamed, roasted, or grilled). Green salads. Fruits  All fresh, canned (in natural juice), or frozen fruits. Meat and Other Protein Products  Ground beef (85% or leaner), grass-fed beef, or beef trimmed of fat. Skinless chicken or turkey. Ground chicken or turkey. Pork trimmed of fat. All fish and seafood. Eggs. Dried beans, peas, or lentils. Unsalted nuts or seeds. Unsalted canned or dry beans. Dairy  Low-fat dairy products, such as skim or 1% milk, 2% or reduced-fat cheeses, low-fat ricotta or cottage cheese, or plain low-fat yogurt. Fats and Oils  Tub margarines without trans fats. Light or reduced-fat mayonnaise and salad dressings. Avocado. Olive, canola, sesame, or safflower oils. Natural peanut or almond butter (choose ones without added sugar and oil). The items listed above may not be a complete list of recommended foods or beverages. Contact your dietitian for more options.  What foods are not recommended? Grains  White bread. White pasta. White rice. Cornbread. Bagels, pastries, and croissants. Crackers that contain trans fat. Vegetables  White potatoes. Corn. Creamed or fried vegetables. Vegetables in a cheese sauce. Fruits  Dried fruits. Canned fruit in light or heavy syrup. Fruit juice. Meat and Other Protein Products  Fatty cuts of meat. Ribs, chicken wings, bacon, sausage, bologna, salami, chitterlings, fatback, hot dogs, bratwurst, and packaged luncheon meats. Liver and organ meats. Dairy  Whole or 2% milk, cream, half-and-half, and cream cheese. Whole milk cheeses. Whole-fat or sweetened yogurt. Full-fat cheeses. Nondairy creamers and whipped   toppings. Processed cheese, cheese spreads, or cheese curds. Sweets and Desserts  Corn syrup, sugars, honey, and molasses. Candy. Jam and jelly. Syrup. Sweetened cereals. Cookies, pies, cakes, donuts, muffins, and ice  cream. Fats and Oils  Butter, stick margarine, lard, shortening, ghee, or bacon fat. Coconut, palm kernel, or palm oils. Beverages  Alcohol. Sweetened drinks (such as sodas, lemonade, and fruit drinks or punches). The items listed above may not be a complete list of foods and beverages to avoid. Contact your dietitian for more information.  This information is not intended to replace advice given to you by your health care provider. Make sure you discuss any questions you have with your health care provider. Document Released: 12/09/2011 Document Revised: 02/14/2016 Document Reviewed: 09/08/2013 Elsevier Interactive Patient Education  2017 [employer]vier Inc.  

## 2016-08-19 NOTE — Assessment & Plan Note (Signed)
CMET and Lipid profile today Encouraged him to consume a low fat diet Continue Zocor for now

## 2016-08-20 ENCOUNTER — Encounter: Payer: Self-pay | Admitting: Internal Medicine

## 2016-08-20 DIAGNOSIS — IMO0001 Reserved for inherently not codable concepts without codable children: Secondary | ICD-10-CM

## 2016-08-20 DIAGNOSIS — E1165 Type 2 diabetes mellitus with hyperglycemia: Principal | ICD-10-CM

## 2016-09-01 MED ORDER — SITAGLIPTIN PHOSPHATE 100 MG PO TABS: 100.0000 mg | ORAL_TABLET | Freq: Every day | ORAL | 3 refills | Status: DC

## 2016-09-01 MED ORDER — SIMVASTATIN 40 MG PO TABS: 40.0000 mg | ORAL_TABLET | Freq: Every day | ORAL | 3 refills | Status: DC

## 2016-09-01 MED ORDER — GLIPIZIDE 10 MG PO TABS: 10.0000 mg | ORAL_TABLET | Freq: Two times a day (BID) | ORAL | 3 refills | Status: DC

## 2016-09-02 ENCOUNTER — Other Ambulatory Visit: Payer: Self-pay | Admitting: Internal Medicine

## 2016-09-02 DIAGNOSIS — E1165 Type 2 diabetes mellitus with hyperglycemia: Principal | ICD-10-CM

## 2016-09-02 DIAGNOSIS — IMO0001 Reserved for inherently not codable concepts without codable children: Secondary | ICD-10-CM

## 2016-12-14 IMAGING — US US RENAL
1 series · 14 of 25 positions shown · non-contrast
Comparison: CT, [DATE]

CLINICAL DATA: Kidney stone noted on a CT dated [DATE]. Stone
removal on [DATE]. Subsequent stent placement and removal.

EXAM:
RENAL / URINARY TRACT ULTRASOUND COMPLETE

[Series 1: us renal · 0.22mm/px · 14 of 32 slices shown]
[im 1/32]
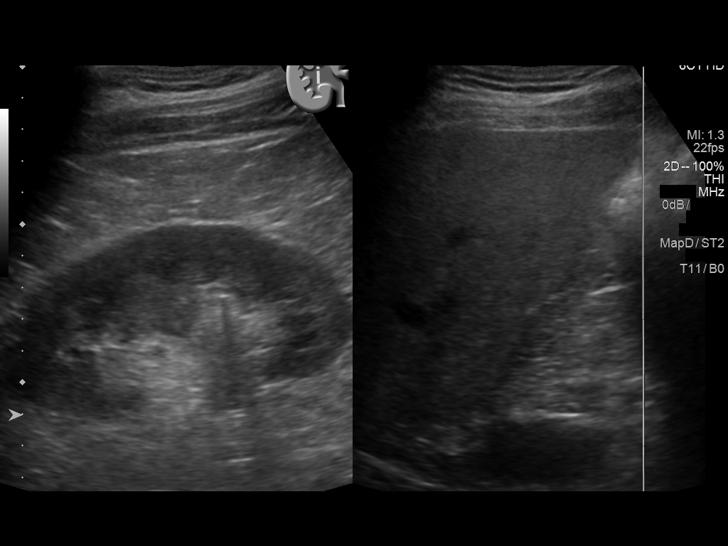
[im 3/32]
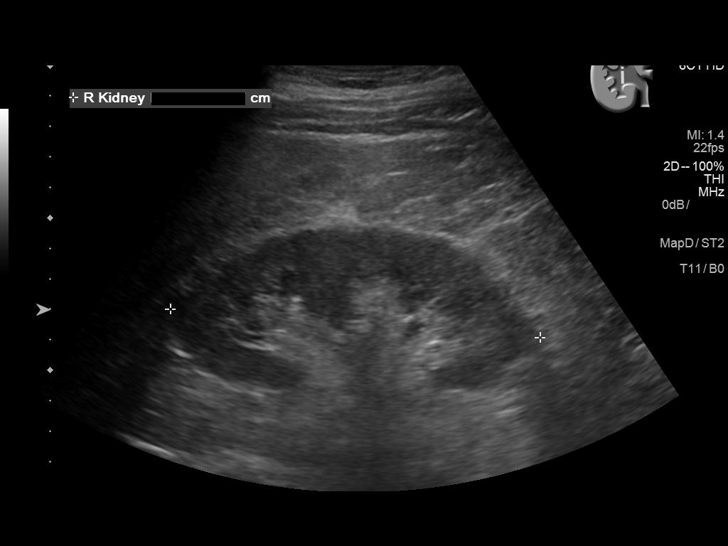
[im 6/32]
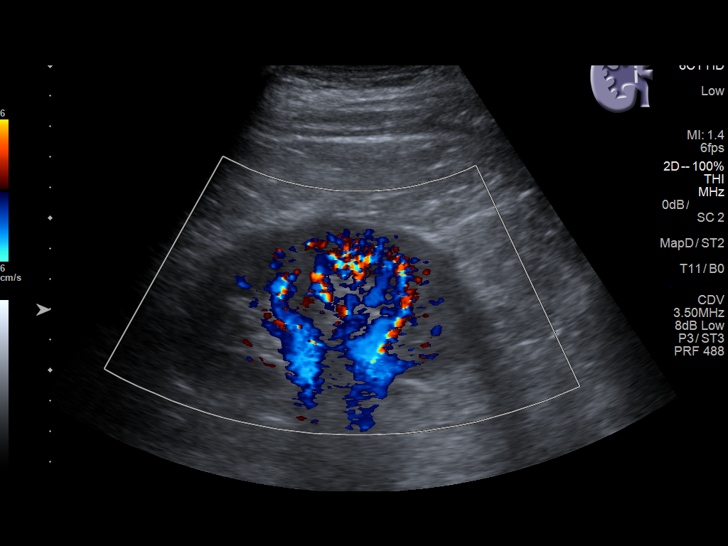
[im 8/32]
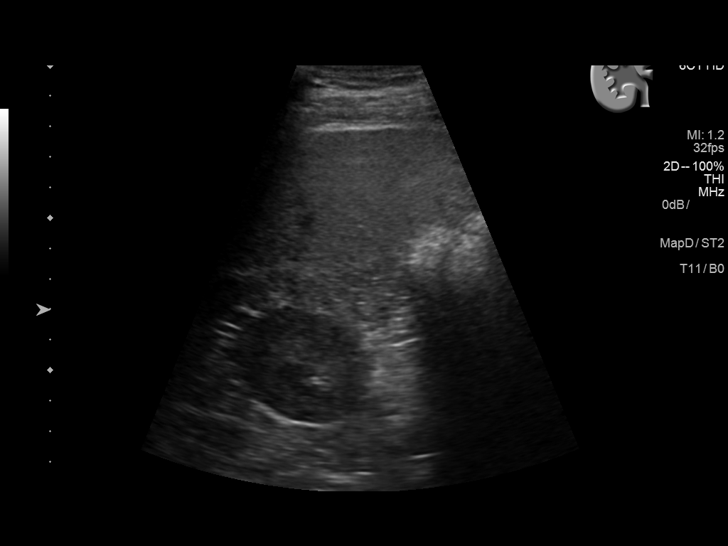
[im 11/32]
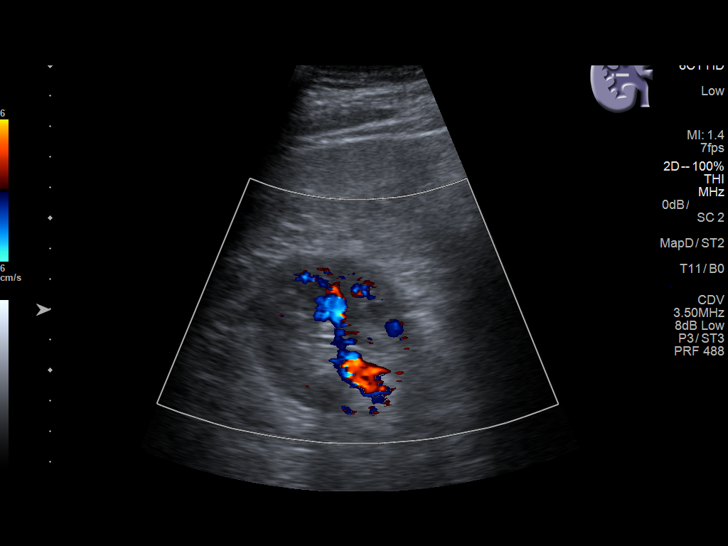
[im 12/32]
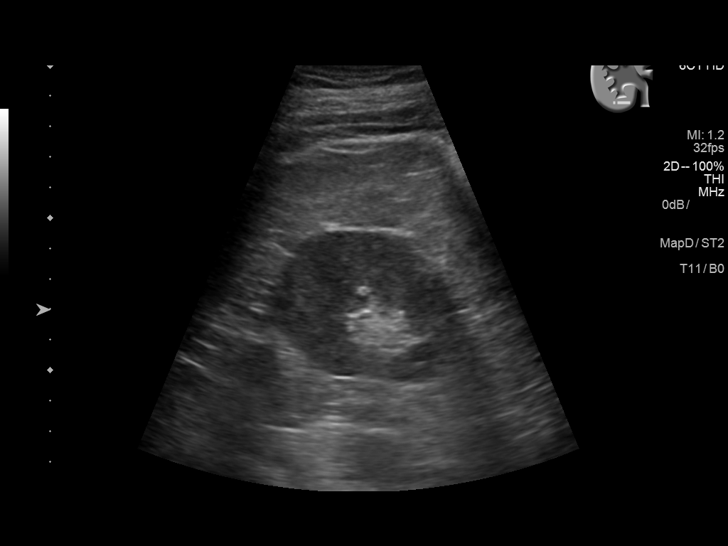
[im 15/32]
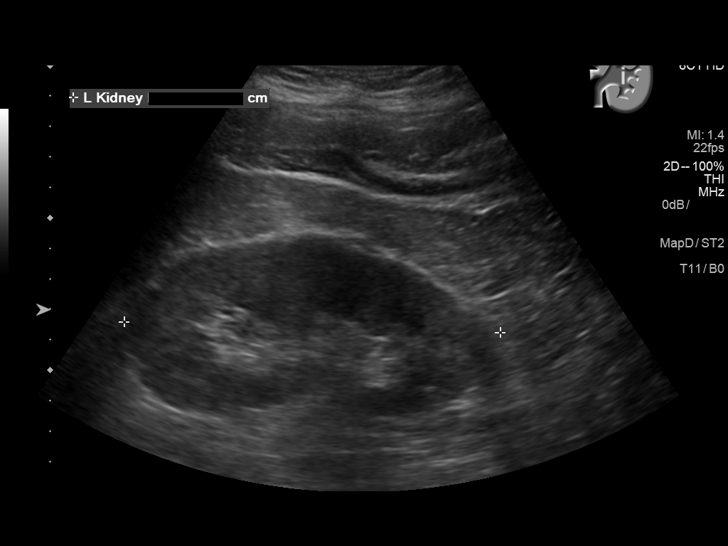
[im 17/32]
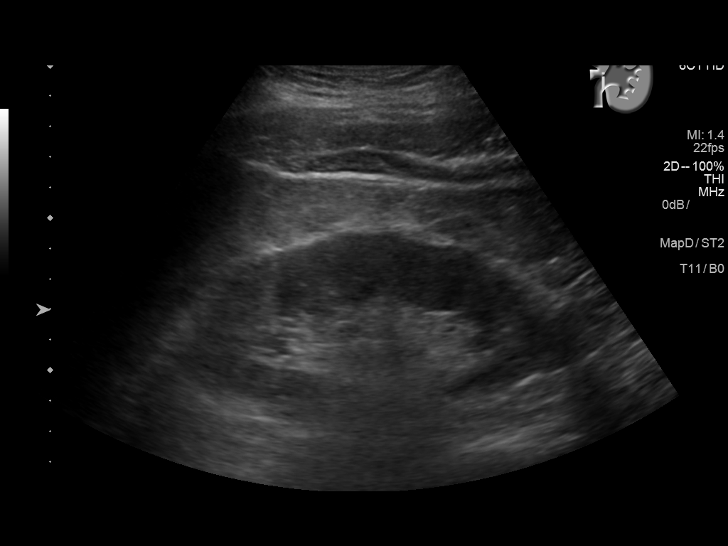
[im 20/32]
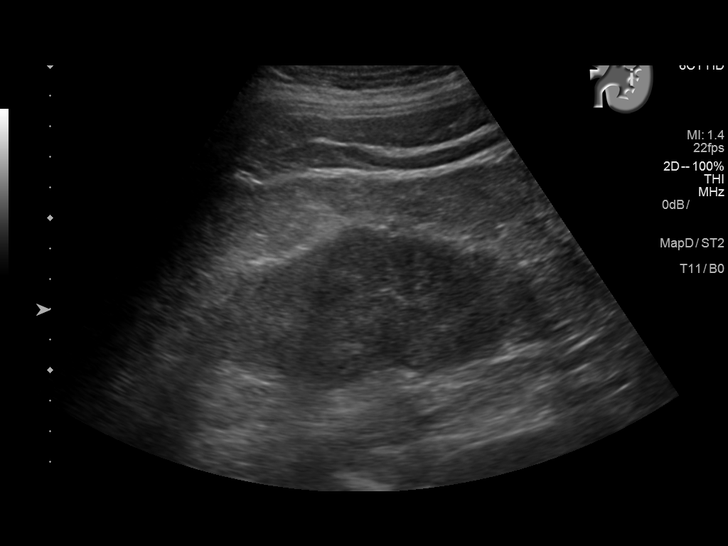
[im 21/32]
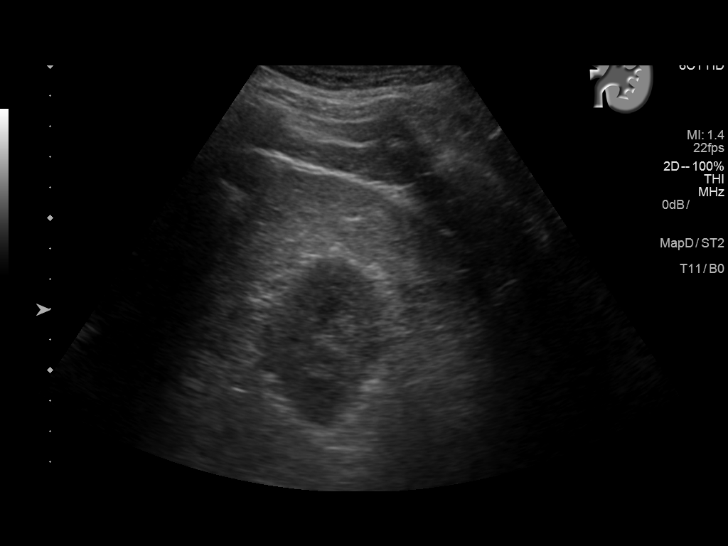
[im 24/32]
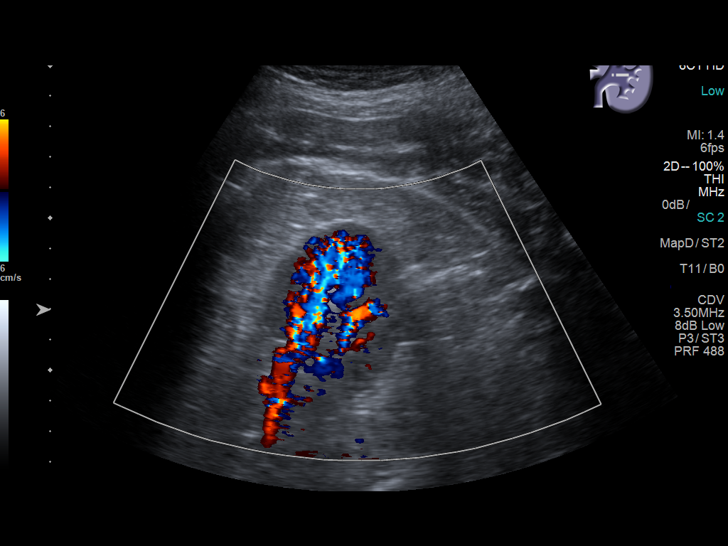
[im 26/32]
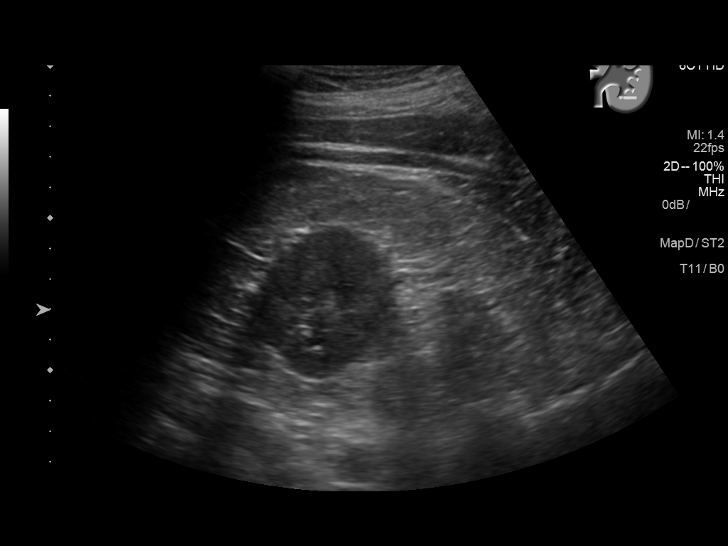
[im 29/32]
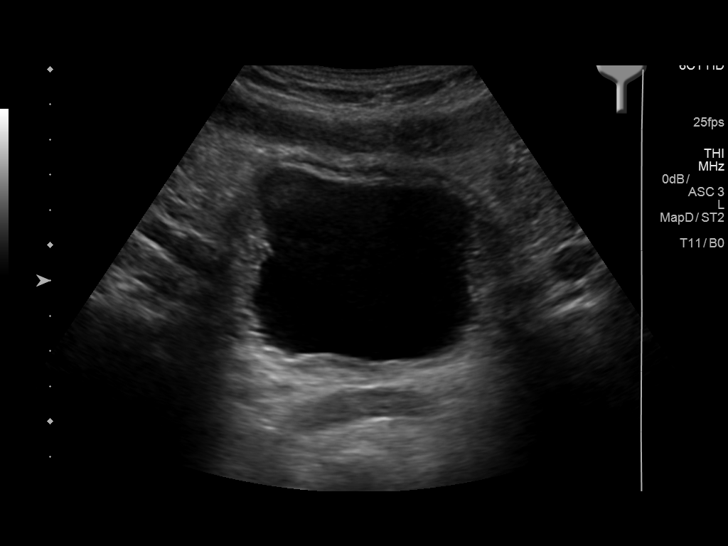
[im 32/32]
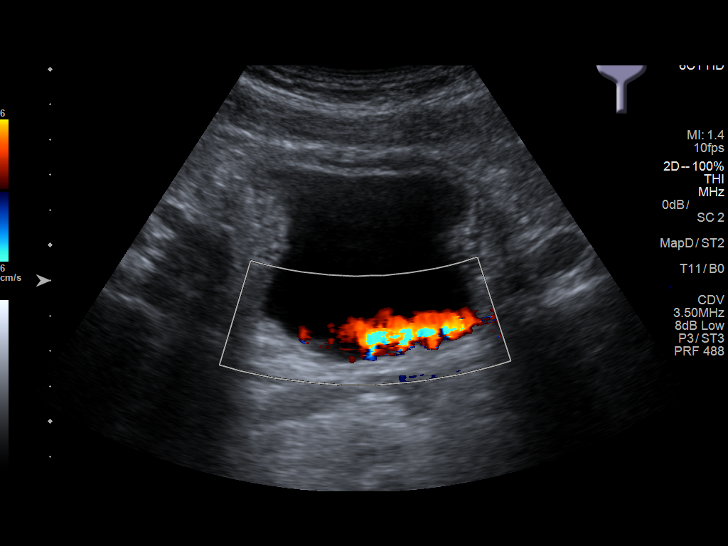

[14 of 25 positions shown; findings below may reference images not displayed]

FINDINGS: Right Kidney:

Length: 12.2 cm. Echogenicity within normal limits. No mass or
hydronephrosis visualized.

Left Kidney:

Length: 12.4 cm. Echogenicity within normal limits. No mass or
hydronephrosis visualized.

Bladder:

Appears normal for degree of bladder distention.
IMPRESSION: Normal

## 2017-03-03 ENCOUNTER — Emergency Department
Admission: EM | Admit: 2017-03-03 | Discharge: 2017-03-03 | Disposition: A | Discharge status: Home / Self Care | Payer: BC Managed Care – PPO | Attending: Emergency Medicine | Admitting: Emergency Medicine

## 2017-03-03 ENCOUNTER — Emergency Department: Payer: BC Managed Care – PPO

## 2017-03-03 ENCOUNTER — Encounter: Payer: Self-pay | Admitting: Emergency Medicine

## 2017-03-03 DIAGNOSIS — E119 Type 2 diabetes mellitus without complications: Secondary | ICD-10-CM | POA: Insufficient documentation

## 2017-03-03 DIAGNOSIS — R1110 Vomiting, unspecified: Secondary | ICD-10-CM | POA: Insufficient documentation

## 2017-03-03 DIAGNOSIS — R0602 Shortness of breath: Secondary | ICD-10-CM | POA: Insufficient documentation

## 2017-03-03 DIAGNOSIS — R079 Chest pain, unspecified: Secondary | ICD-10-CM | POA: Insufficient documentation

## 2017-03-03 DIAGNOSIS — Z7984 Long term (current) use of oral hypoglycemic drugs: Secondary | ICD-10-CM | POA: Insufficient documentation

## 2017-03-03 DIAGNOSIS — F419 Anxiety disorder, unspecified: Secondary | ICD-10-CM | POA: Insufficient documentation

## 2017-03-03 LAB — BASIC METABOLIC PANEL
Anion gap: 14 (ref 5–15)
BUN: 16 mg/dL (ref 6–20)
CHLORIDE: 99 mmol/L — AB (ref 101–111)
CO2: 24 mmol/L (ref 22–32)
CREATININE: 0.87 mg/dL (ref 0.61–1.24)
Calcium: 9.7 mg/dL (ref 8.9–10.3)
GFR calc Af Amer: >60 mL/min (ref >60)
GFR calc non Af Amer: >60 mL/min (ref >60)
Glucose, Bld: 294 mg/dL — ABNORMAL HIGH (ref 65–99)
Potassium: 3.7 mmol/L (ref 3.5–5.1)
SODIUM: 137 mmol/L (ref 135–145)

## 2017-03-03 LAB — CBC
HEMATOCRIT: 51.5 % (ref 40.0–52.0)
Hemoglobin: 18 g/dL (ref 13.0–18.0)
MCH: 29.4 pg (ref 26.0–34.0)
MCHC: 34.8 g/dL (ref 32.0–36.0)
MCV: 84.3 fL (ref 80.0–100.0)
Platelets: 271 10*3/uL (ref 150–440)
RBC: 6.11 MIL/uL — ABNORMAL HIGH (ref 4.40–5.90)
RDW: 12.9 % (ref 11.5–14.5)
WBC: 16.3 10*3/uL — ABNORMAL HIGH (ref 3.8–10.6)

## 2017-03-03 LAB — TROPONIN I: Troponin I: <0.03 ng/mL (ref <0.03)

## 2017-03-03 LAB — GLUCOSE, CAPILLARY: GLUCOSE-CAPILLARY: 274 mg/dL — AB (ref 65–99)

## 2017-03-03 LAB — LIPASE, BLOOD: LIPASE: 27 U/L (ref 11–51)

## 2017-03-03 IMAGING — CR DG CHEST 2V
2 series · 2 of 2 positions shown · non-contrast
Comparison: [DATE].

CLINICAL DATA: 45-year-old who awoke this morning with squeezing
chest pain associated with nausea and vomiting. Current history of
diabetes and hyperlipidemia.

EXAM:
CHEST  2 VIEW

[chest lat]
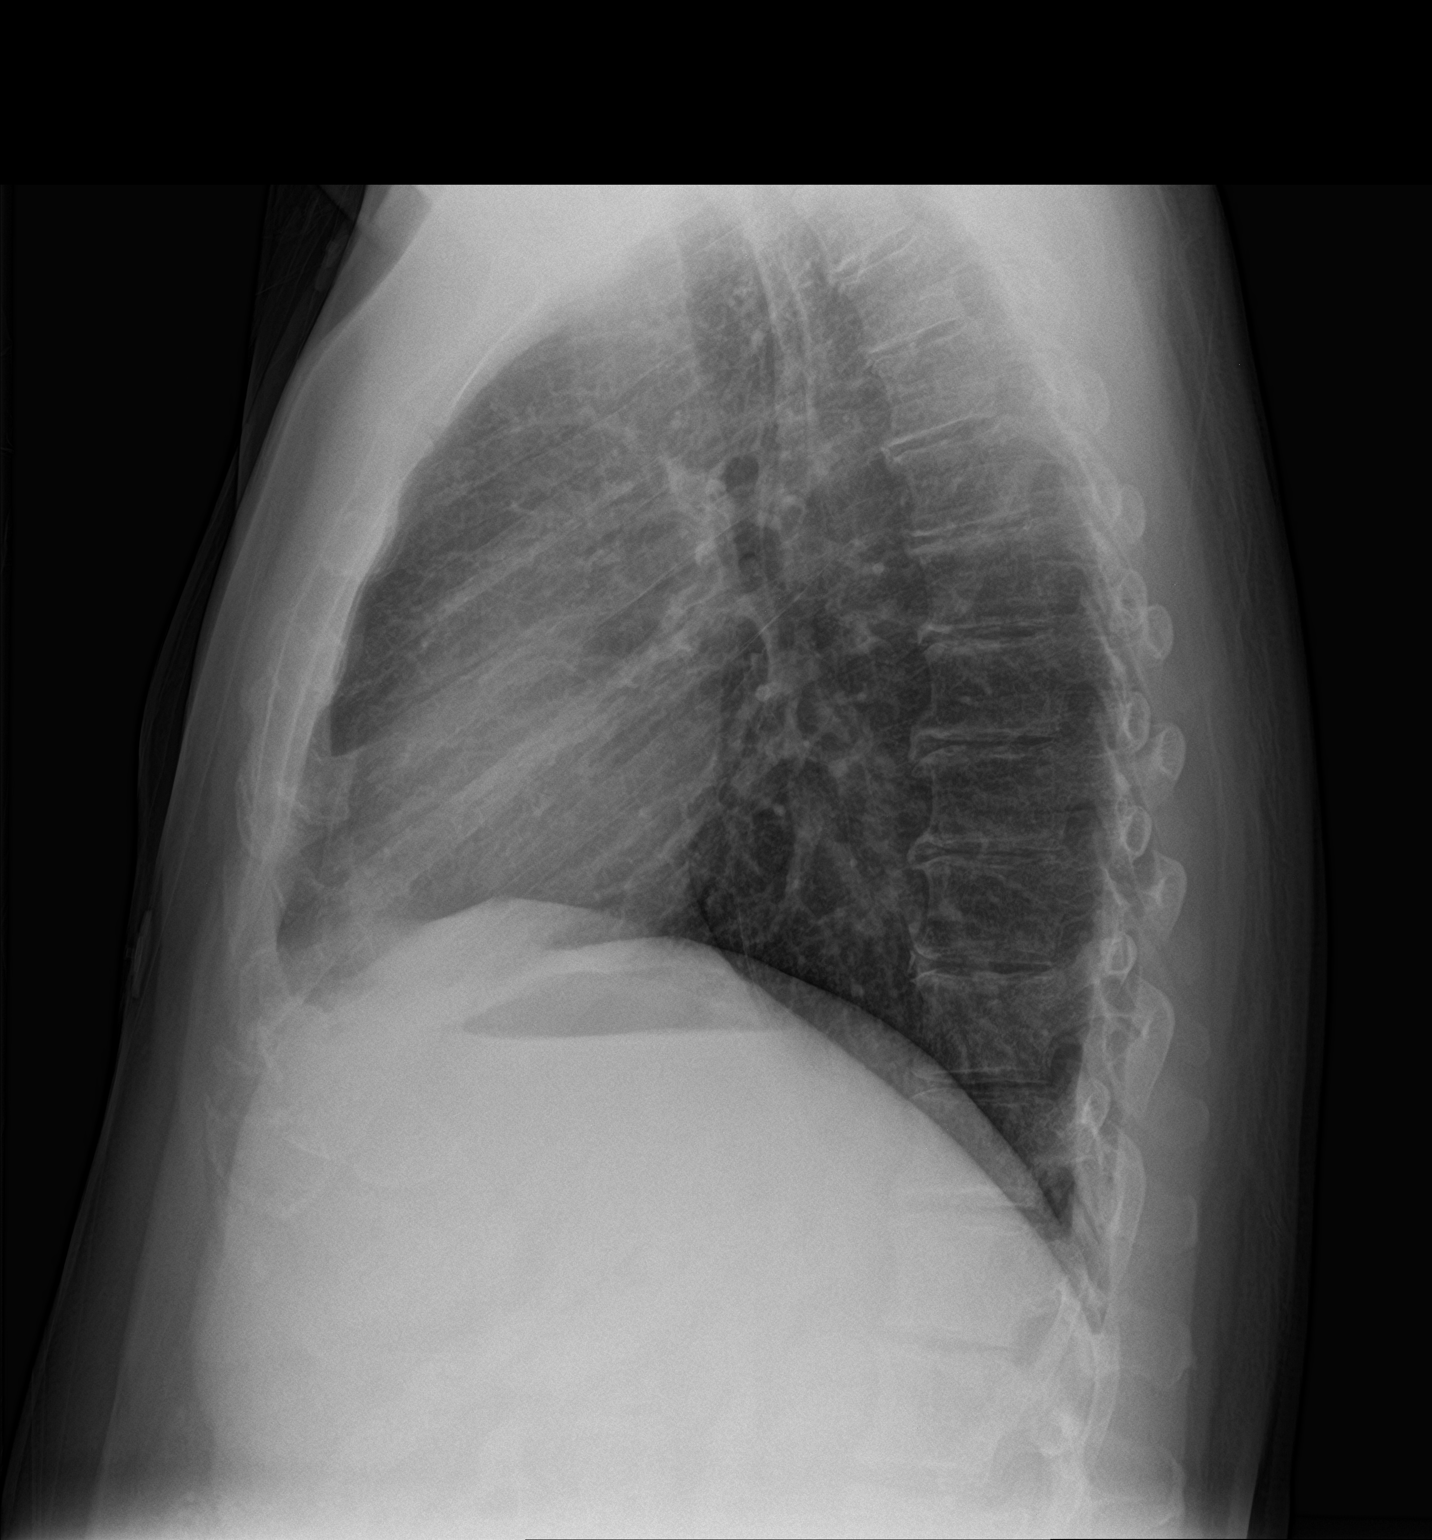

[chest ap]
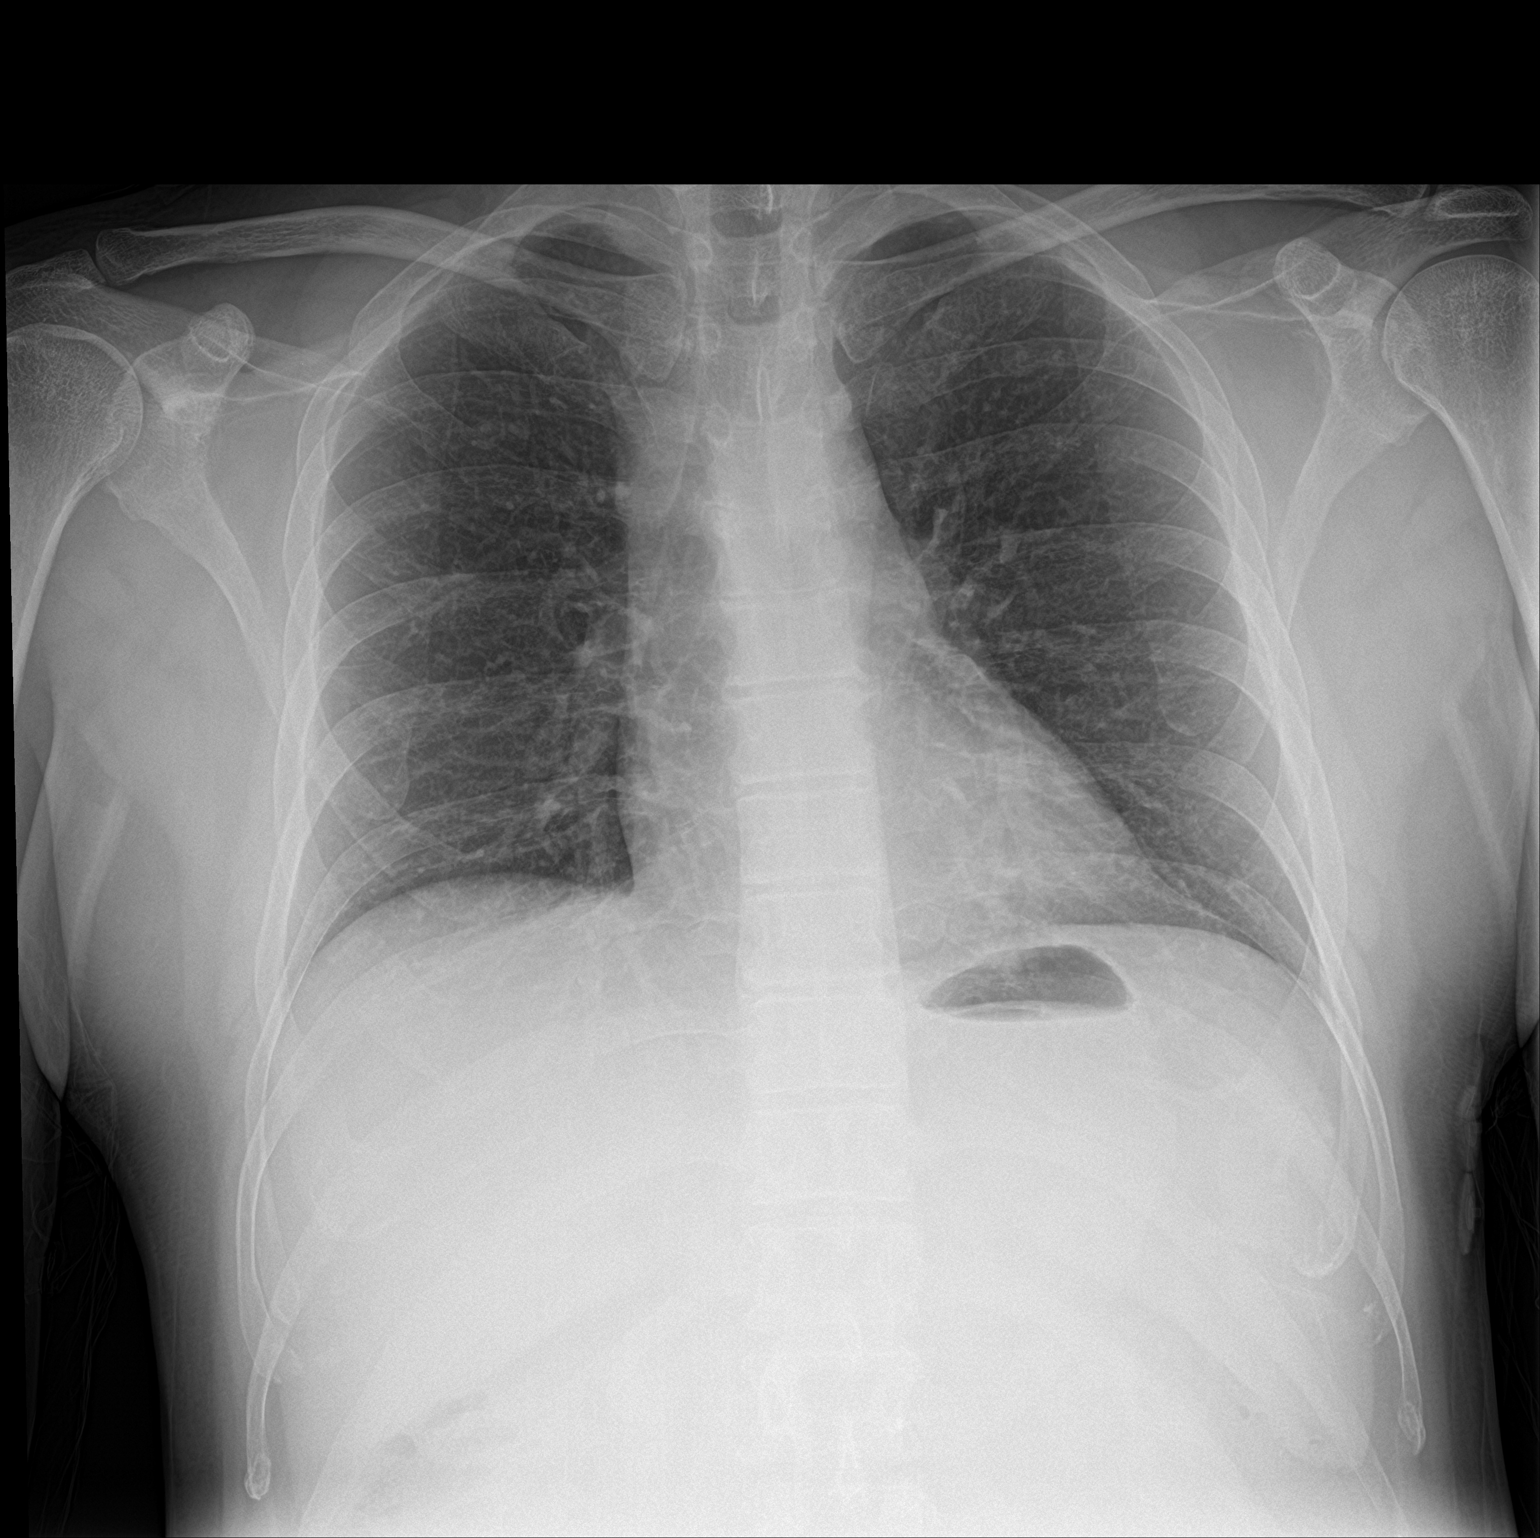

[2 of 2 positions shown; findings below may reference images not displayed]

FINDINGS: AP erect and lateral images were obtained. Cardiomediastinal
silhouette unremarkable, unchanged. Lungs clear. Bronchovascular
markings normal. Pulmonary vascularity normal. No visible pleural
effusions. No pneumothorax. Degenerative changes throughout the
thoracic spine.
IMPRESSION: No acute cardiopulmonary disease.

## 2017-03-03 IMAGING — CT CT ANGIO CHEST-ABD-PELV FOR DISSECTION W/ AND WO/W CM
2 of 7 series · 14 of 46 positions shown, 16 images · IV contrast (APPLIED)
Comparison: CT abdomen pelvis dated [DATE].

CLINICAL DATA: Chest and back pain. Evaluate for aortic dissection.

EXAM:
CT ANGIOGRAPHY CHEST, ABDOMEN AND PELVIS
TECHNIQUE: Multidetector CT imaging through the chest, abdomen and pelvis was
performed using the standard protocol during bolus administration of
intravenous contrast. Multiplanar reconstructed images and MIPs were
obtained and reviewed to evaluate the vascular anatomy.
CONTRAST:  100 cc Isovue 370 intravenous contrast.

[Series 5: axial arterial · axial · arterial · 0.70mm/px · z∈[-696,-102]mm · 11 of 230 slices shown, 13 images]
[im 16/230  soft-tissue]
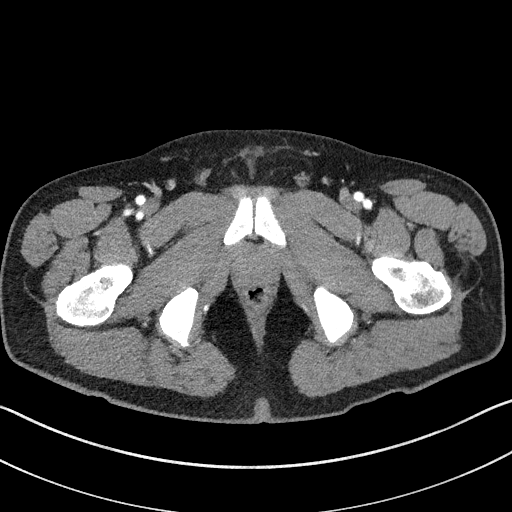
[im 16/230  bone]
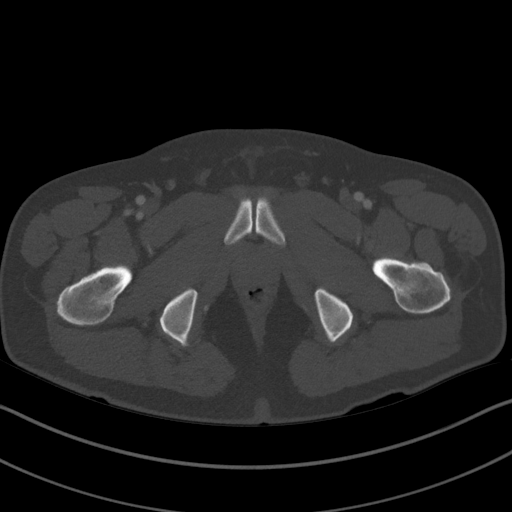
[im 31/230  soft-tissue]
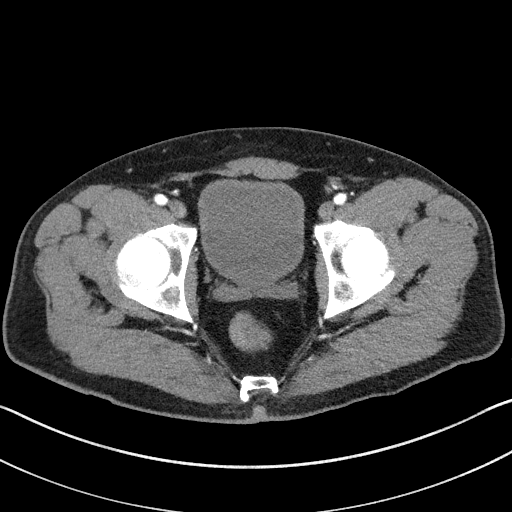
[im 62/230  soft-tissue]
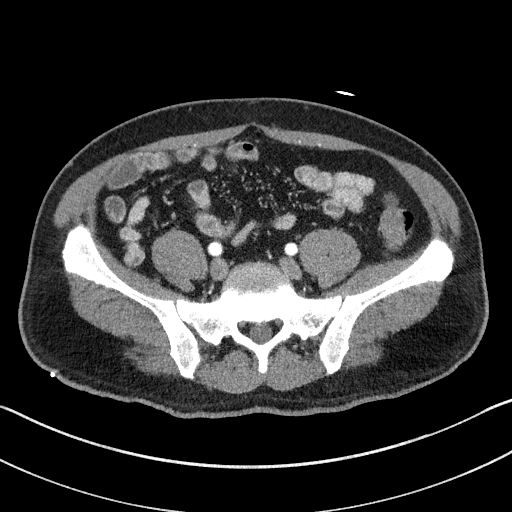
[im 77/230  soft-tissue]
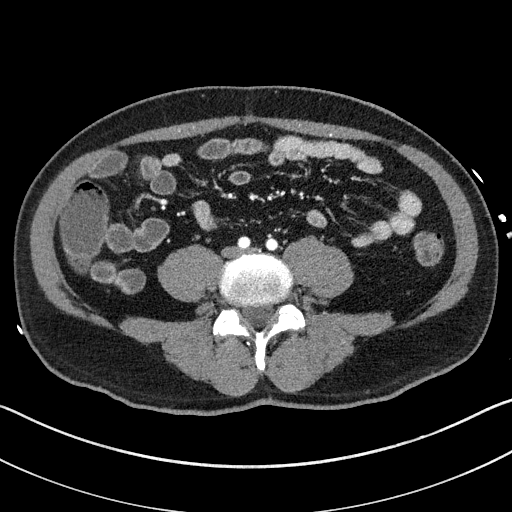
[im 92/230  soft-tissue]
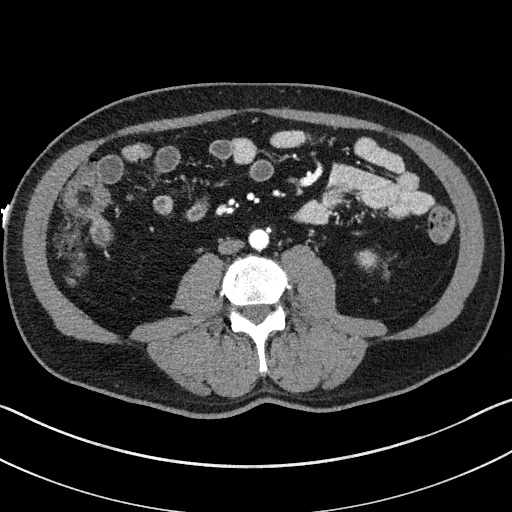
[im 123/230  soft-tissue]
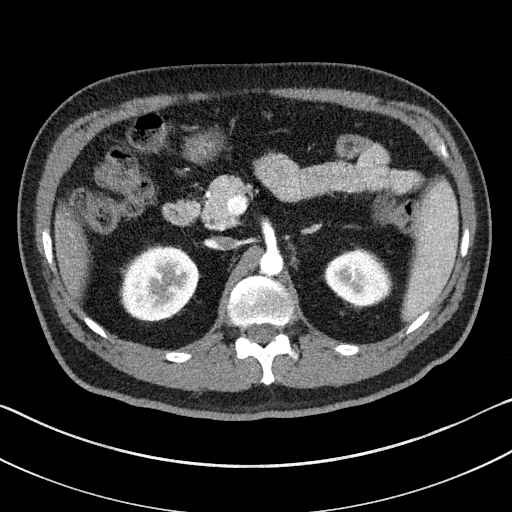
[im 138/230  soft-tissue]
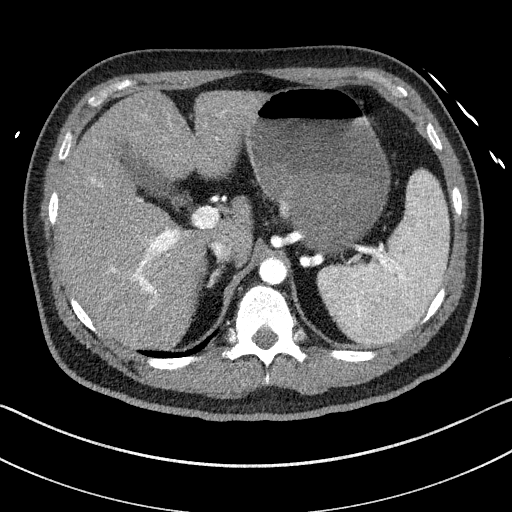
[im 153/230  soft-tissue]
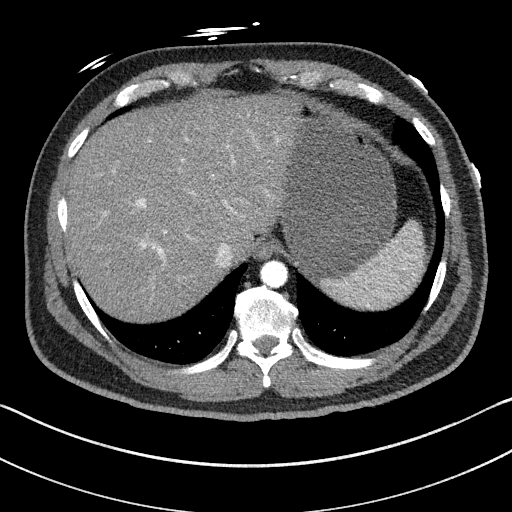
[im 168/230  soft-tissue]
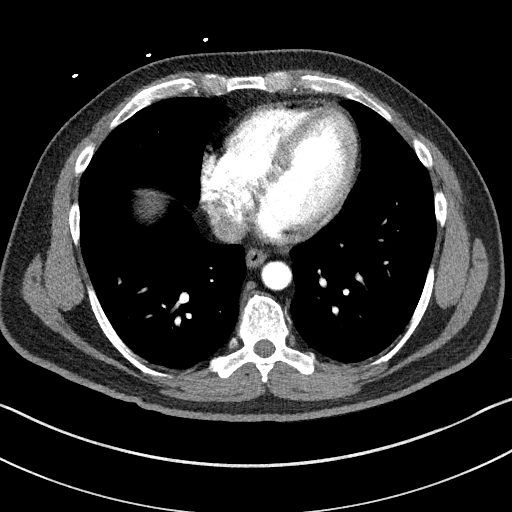
[im 168/230  bone]
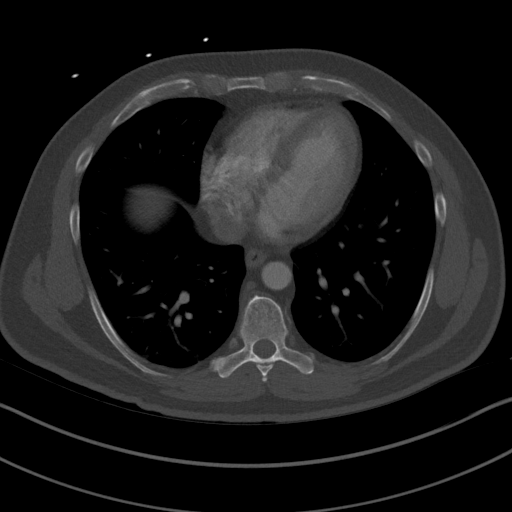
[im 199/230  soft-tissue]
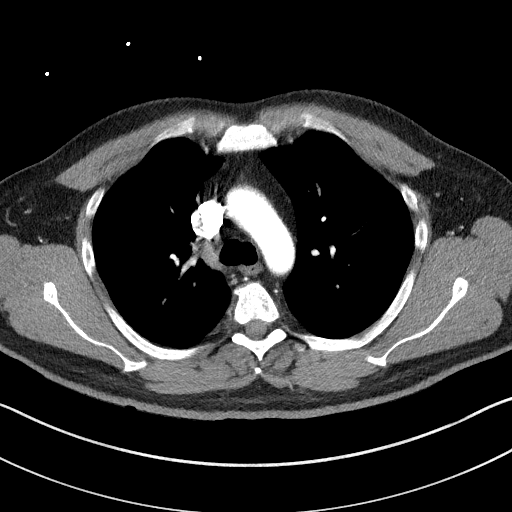
[im 214/230  soft-tissue]
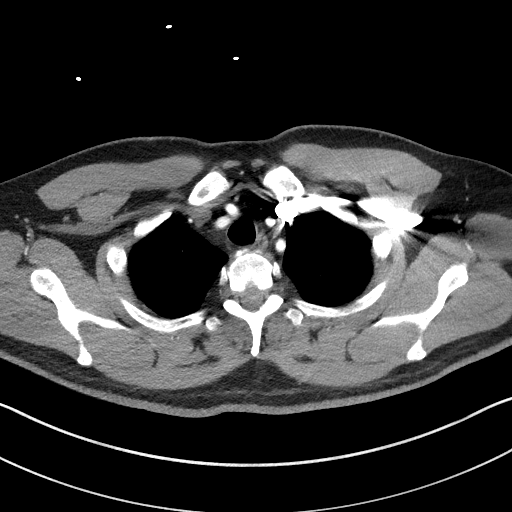

[Series 8: coronals · coronal · 0.79mm/px · 3 of 129 slices shown]
[im 33/129  soft-tissue]
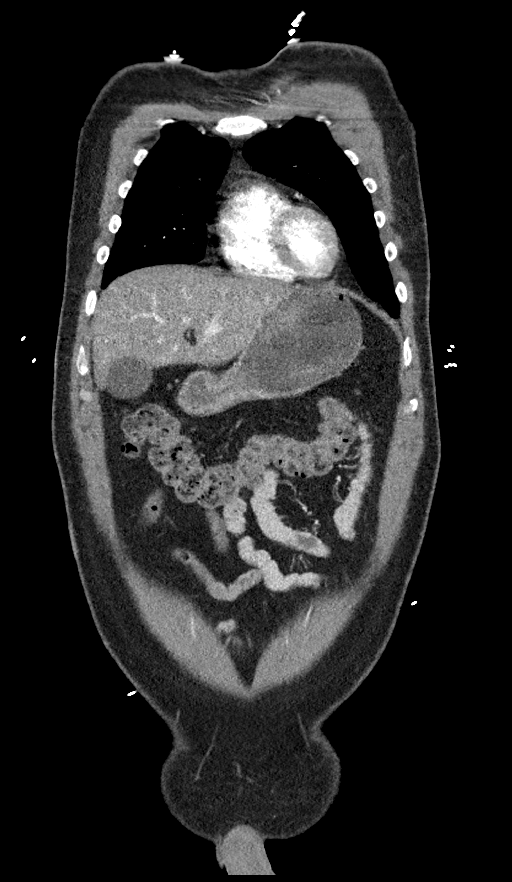
[im 65/129  soft-tissue]
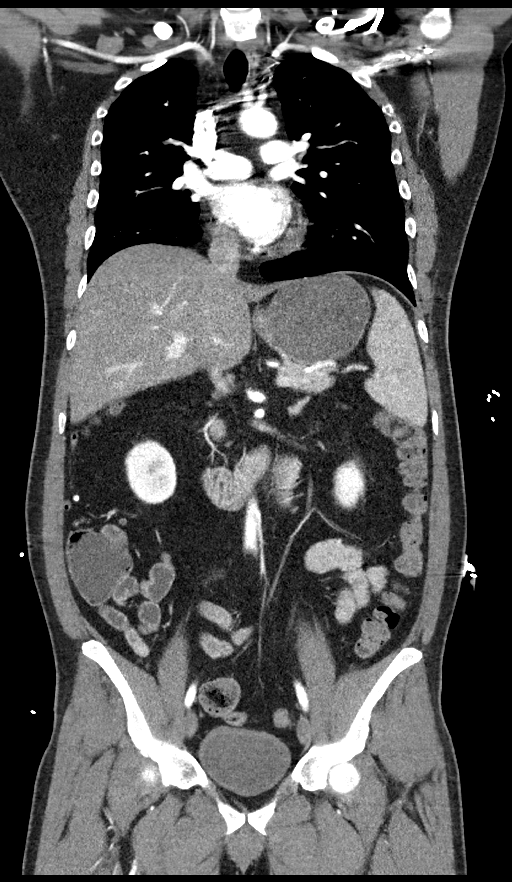
[im 97/129  soft-tissue]
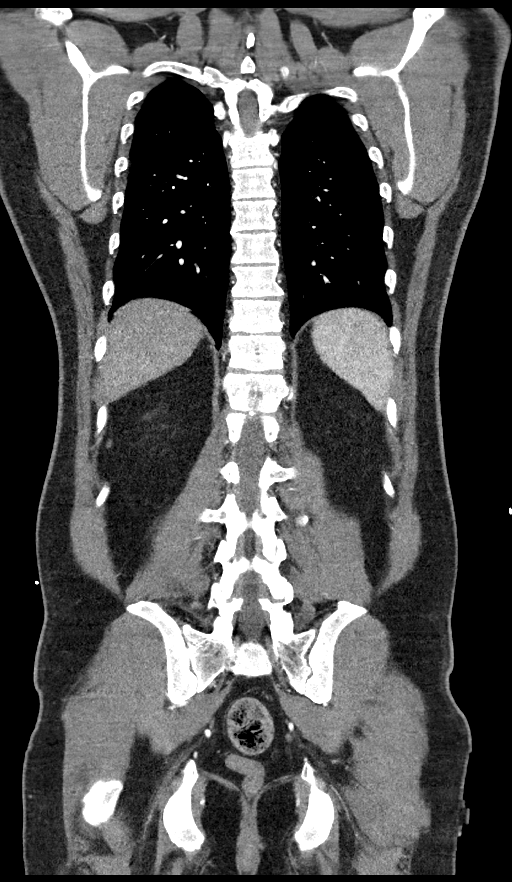

[14 of 46 positions shown; findings below may reference images not displayed]

FINDINGS: CTA CHEST FINDINGS

Cardiovascular: Preferential opacification of the thoracic aorta. No
evidence of thoracic aortic aneurysm or dissection. Normal heart
size. No pericardial effusion. No evidence of central pulmonary
embolism.

Mediastinum/Nodes: No enlarged mediastinal, hilar, or axillary lymph
nodes. Thyroid gland, trachea, and esophagus demonstrate no
significant findings.

Lungs/Pleura: Lungs are clear. No pleural effusion or pneumothorax.

Musculoskeletal: No chest wall abnormality. No acute or significant
osseous findings.

Review of the MIP images confirms the above findings.

CTA ABDOMEN AND PELVIS FINDINGS

VASCULAR

Aorta: Normal caliber aorta without aneurysm, dissection, vasculitis
or significant stenosis.

Celiac: Patent without evidence of aneurysm, dissection, vasculitis
or significant stenosis.

SMA: Patent without evidence of aneurysm, dissection, vasculitis or
significant stenosis.

Renals: Two right and single left renal arteries are patent without
evidence of aneurysm, dissection, vasculitis, fibromuscular
dysplasia or significant stenosis.

IMA: Patent without evidence of aneurysm, dissection, vasculitis or
significant stenosis.

Inflow: Patent without evidence of aneurysm, dissection, vasculitis
or significant stenosis.

Veins: No obvious venous abnormality within the limitations of this
arterial phase study.

Review of the MIP images confirms the above findings.

NON-VASCULAR

Hepatobiliary: Diffuse hepatic steatosis. No focal liver
abnormality. The gallbladder is unremarkable. No biliary dilatation.

Pancreas: Unremarkable. No pancreatic ductal dilatation or
surrounding inflammatory changes.

Spleen: Mild splenomegaly, measuring up to 13 cm. No focal
abnormality.

Adrenals/Urinary Tract: Adrenal glands are unremarkable. Kidneys are
normal, without renal calculi, focal lesion, or hydronephrosis.
Bladder is unremarkable.

Stomach/Bowel: Stomach is within normal limits. Appendix appears
normal. No evidence of bowel wall thickening, distention, or
inflammatory changes.

Lymphatic: No enlarged abdominal or pelvic lymph nodes.

Reproductive: Prostate is unremarkable.

Other: No abdominal wall hernia or abnormality. No abdominopelvic
ascites.

Musculoskeletal: No acute or significant osseous findings.

Review of the MIP images confirms the above findings.
IMPRESSION: 1. No evidence of acute aortic syndrome in the chest, abdomen, or
pelvis.
2. No acute intrathoracic or intra-abdominal process.
3. Diffuse hepatic steatosis, unchanged.
4. Mild splenomegaly.

## 2017-03-03 MED ORDER — ONDANSETRON HCL 4 MG/2ML IJ SOLN
4.0000 mg | Freq: Once | INTRAMUSCULAR | Status: AC
  Administered 2017-03-03: 4 mg via INTRAVENOUS
  Filled 2017-03-03: qty 2

## 2017-03-03 MED ORDER — ONDANSETRON HCL 4 MG/2ML IJ SOLN
4.0000 mg | Freq: Once | INTRAMUSCULAR | Status: AC | PRN
  Administered 2017-03-03: 4 mg via INTRAVENOUS
  Filled 2017-03-03: qty 2

## 2017-03-03 MED ORDER — LORAZEPAM 2 MG/ML IJ SOLN
1.0000 mg | Freq: Once | INTRAMUSCULAR | Status: AC
  Administered 2017-03-03: 1 mg via INTRAVENOUS

## 2017-03-03 MED ORDER — ONDANSETRON HCL 4 MG PO TABS: 4.0000 mg | ORAL_TABLET | Freq: Every day | ORAL | 0 refills | Status: DC | PRN

## 2017-03-03 MED ORDER — MORPHINE SULFATE (PF) 4 MG/ML IV SOLN
4.0000 mg | Freq: Once | INTRAVENOUS | Status: AC
  Administered 2017-03-03: 4 mg via INTRAVENOUS
  Filled 2017-03-03: qty 1

## 2017-03-03 MED ORDER — LORAZEPAM 2 MG/ML IJ SOLN: 0.5000 mg | Freq: Once | INTRAMUSCULAR | Status: DC

## 2017-03-03 MED ORDER — IOPAMIDOL (ISOVUE-370) INJECTION 76%
100.0000 mL | Freq: Once | INTRAVENOUS | Status: AC | PRN
  Administered 2017-03-03: 100 mL via INTRAVENOUS

## 2017-03-03 MED ORDER — LORAZEPAM 2 MG/ML IJ SOLN
INTRAMUSCULAR | Status: AC
  Administered 2017-03-03: 1 mg via INTRAVENOUS
  Filled 2017-03-03: qty 1

## 2017-03-03 MED ORDER — SODIUM CHLORIDE 0.9 % IV SOLN
Freq: Once | INTRAVENOUS | Status: AC
  Administered 2017-03-03: 14:00:00 via INTRAVENOUS

## 2017-03-03 NOTE — ED Notes (Signed)
Dr Jimmye Norman back to bedside.

## 2017-03-03 NOTE — ED Triage Notes (Signed)
Patient reports heartburn and chest pressure starting last night. Reports he woke up vomiting with squeezing chest pain. Patient also reports shortness of breath. Denies cardiac history. Patient vomiting in triage.

## 2017-03-03 NOTE — ED Provider Notes (Signed)
[facility] Emergency Department Provider Note       Time seen: ----------------------------------------- 1:25 PM on 03/03/2017 -----------------------------------------     I have reviewed the triage vital signs and the nursing notes.   HISTORY   Chief Complaint Chest Pain    HPI Alan Wise is a 46 y.o. male who presents to the ED for heartburn and chest pressure starting last night. Patient reports he woke up vomiting with squeezing chest pain. Patient also reports shortness of breath, denies any cardiac history. He arrives vomiting. Statespain is 10 out of 10, nothing makes it better or worse.patient states he's had about 3 or 4 episodes of the chest pressure followed by vomiting. He does report to being under significant stress recently.   Past Medical History:  Diagnosis Date  . Allergy   . Diabetes mellitus ([place])   . History of kidney stones   . Hyperlipidemia   . Renal disorder    kidney stones    Patient Active Problem List   Diagnosis Date Noted  . Kidney stone 01/24/2016  . Stricture, urethra 08/17/2014  . Type 2 diabetes mellitus with hyperglycemia ([place]) 06/12/2014  . HLD (hyperlipidemia) 06/12/2014    Past Surgical History:  Procedure Laterality Date  . CYSTOSCOPY WITH STENT PLACEMENT Right 01/25/2016   Procedure: CYSTOSCOPY WITH STENT PLACEMENT;  Surgeon: Nickie Retort, MD;  Location: [facility] ORS;  Service: Urology;  Laterality: Right;  . KNEE ARTHROSCOPY W/ MENISCAL REPAIR Right 1990  . SKIN CANCER EXCISION  2014  . URETEROSCOPY WITH HOLMIUM LASER LITHOTRIPSY Right 01/25/2016   Procedure: URETEROSCOPY WITH HOLMIUM LASER LITHOTRIPSY;  Surgeon: Nickie Retort, MD;  Location: [facility] ORS;  Service: Urology;  Laterality: Right;  . URETHRAL STRICTURE DILATATION    . WISDOM TOOTH EXTRACTION      Allergies Patient has no known allergies.  Social History Social History  Substance Use Topics  . Smoking status: Never  Smoker  . Smokeless tobacco: Never Used  . Alcohol use 0.0 oz/week     Comment: rare    Review of Systems Constitutional: Negative for fever. Cardiovascular:positive for chest pain Respiratory: positive for shortness of breath Gastrointestinal: Negative for abdominal pain, positive for vomiting Genitourinary: Negative for dysuria. Musculoskeletal: Negative for back pain. Skin: Negative for rash. Neurological: Negative for headaches, focal weakness or numbness.  All systems negative/normal/unremarkable except as stated in the HPI  ____________________________________________   PHYSICAL EXAM:  VITAL SIGNS: ED Triage Vitals  Enc Vitals Group     BP --      Pulse --      Resp --      Temp --      Temp src --      SpO2 --      Weight 03/03/17 1308 173 lb (78.5 kg)     Height 03/03/17 1308 5\' 8"  (1.727 m)     Head Circumference --      Peak Flow --      Pain Score 03/03/17 1302 10     Pain Loc --      Pain Edu? --      Excl. in [place]? --     Constitutional: Alert and oriented. mild distress Eyes: Conjunctivae are normal. Normal extraocular movements. ENT   Head: Normocephalic and atraumatic.   Nose: No congestion/rhinnorhea.   Mouth/Throat: Mucous membranes are moist.   Neck: No stridor. Cardiovascular: rapid rate, regular rhythm. No murmurs, rubs, or gallops. Respiratory: Normal respiratory effort without tachypnea nor retractions.  Breath sounds are clear and equal bilaterally. No wheezes/rales/rhonchi. Gastrointestinal: Soft and nontender. Normal bowel sounds Musculoskeletal: Nontender with normal range of motion in extremities. No lower extremity tenderness nor edema. Neurologic:  Normal speech and language. No gross focal neurologic deficits are appreciated.  Skin:  Skin is warm, dry and intact. No rash noted. Psychiatric: Mood and affect are normal. Speech and behavior are normal.  ____________________________________________  EKG: Interpreted by  me.sinus tachycardia with rate of 120 bpm, normal PR interval, normal QRS, normal QT.  ____________________________________________  ED COURSE:  Pertinent labs & imaging results that were available during my care of the patient were reviewed by me and considered in my medical decision making (see chart for details). Patient presents for chest pain, we will assess with labs and imaging as indicated. Clinical Course as of Mar 03 1499  Tue Mar 03, 2017  1406 patient was reevaluated for worsening chest pain. He appears be hyperventilating but we will send him for dissection protocol CT. Patient will receive IV Ativan and morphine.  [JW]    Clinical Course User Index [JW] Earleen Newport, MD   Procedures ____________________________________________   LABS (pertinent positives/negatives)  Labs Reviewed  BASIC METABOLIC PANEL - Abnormal; Notable for the following:       Result Value   Chloride 99 (*)    Glucose, Bld 294 (*)    All other components within normal limits  CBC - Abnormal; Notable for the following:    WBC 16.3 (*)    RBC 6.11 (*)    All other components within normal limits  GLUCOSE, CAPILLARY - Abnormal; Notable for the following:    Glucose-Capillary 274 (*)    All other components within normal limits  TROPONIN I  LIPASE, BLOOD  URINALYSIS, COMPLETE (UACMP) WITH MICROSCOPIC    RADIOLOGY  chest x-ray is normal CT dissection protocol IMPRESSION: 1. No evidence of acute aortic syndrome in the chest, abdomen, or pelvis. 2. No acute intrathoracic or intra-abdominal process. 3. Diffuse hepatic steatosis, unchanged. 4. Mild splenomegaly. ____________________________________________  FINAL ASSESSMENT AND PLAN  chest pain   Plan: Patient's labs and imaging were dictated above. Patient had presented for chest pain which appears to be due to anxiety or panic attacks. We have not found any other clear etiology. Repeat troponin is pending at this time  however.   Earleen Newport, MD   Note: This note was generated in part or whole with voice recognition software. Voice recognition is usually quite accurate but there are transcription errors that can and very often do occur. I apologize for any typographical errors that were not detected and corrected.     Earleen Newport, MD 03/03/17 1500
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
## 2017-03-03 NOTE — ED Notes (Signed)
Pt calling out, when this RN came into room pt was grasping chest, reporting chest pain and tightness in chest.  Pt also states sob.  Dr Jimmye Norman notified and back to bedside. Pt HR on monitor is 130's.  Pt also hyperventilating at this time. After several minutes pt calmer, states pain gone and sob gone, HR back to 100.  SR on the monitor.

## 2017-05-03 ENCOUNTER — Other Ambulatory Visit: Payer: Self-pay | Admitting: Internal Medicine

## 2017-12-11 ENCOUNTER — Other Ambulatory Visit: Payer: Self-pay | Admitting: Internal Medicine

## 2017-12-11 DIAGNOSIS — IMO0001 Reserved for inherently not codable concepts without codable children: Secondary | ICD-10-CM

## 2017-12-11 DIAGNOSIS — E1165 Type 2 diabetes mellitus with hyperglycemia: Principal | ICD-10-CM

## 2018-01-16 ENCOUNTER — Other Ambulatory Visit: Payer: Self-pay | Admitting: Internal Medicine

## 2018-01-17 ENCOUNTER — Other Ambulatory Visit: Payer: Self-pay | Admitting: Internal Medicine

## 2018-01-17 DIAGNOSIS — IMO0001 Reserved for inherently not codable concepts without codable children: Secondary | ICD-10-CM

## 2018-01-17 DIAGNOSIS — E1165 Type 2 diabetes mellitus with hyperglycemia: Principal | ICD-10-CM

## 2020-04-24 ENCOUNTER — Ambulatory Visit (INDEPENDENT_AMBULATORY_CARE_PROVIDER_SITE_OTHER)
Admission: RE | Admit: 2020-04-24 | Discharge: 2020-04-24 | Disposition: A | Discharge status: Home / Self Care | Payer: BC Managed Care – PPO | Source: Ambulatory Visit | Attending: Internal Medicine | Admitting: Internal Medicine

## 2020-04-24 ENCOUNTER — Other Ambulatory Visit: Payer: Self-pay

## 2020-04-24 ENCOUNTER — Ambulatory Visit: Payer: BC Managed Care – PPO | Admitting: Internal Medicine

## 2020-04-24 ENCOUNTER — Encounter: Payer: Self-pay | Admitting: Internal Medicine

## 2020-04-24 VITALS — BP 118/86 | HR 87 | Temp 97.8°F | Ht 68.0 in | Wt 160.0 lb

## 2020-04-24 DIAGNOSIS — Z0001 Encounter for general adult medical examination with abnormal findings: Secondary | ICD-10-CM

## 2020-04-24 DIAGNOSIS — M2550 Pain in unspecified joint: Secondary | ICD-10-CM

## 2020-04-24 DIAGNOSIS — E782 Mixed hyperlipidemia: Secondary | ICD-10-CM

## 2020-04-24 DIAGNOSIS — M542 Cervicalgia: Secondary | ICD-10-CM

## 2020-04-24 DIAGNOSIS — E1165 Type 2 diabetes mellitus with hyperglycemia: Secondary | ICD-10-CM

## 2020-04-24 DIAGNOSIS — G8929 Other chronic pain: Secondary | ICD-10-CM | POA: Diagnosis not present

## 2020-04-24 IMAGING — DX DG CERVICAL SPINE COMPLETE 4+V
6 series · 6 of 6 positions shown · non-contrast
Comparison: None.

CLINICAL DATA: Chronic neck pain with associated cervicogenic
headaches.

EXAM:
CERVICAL SPINE - COMPLETE 4+ VIEW

[c-spine lat]
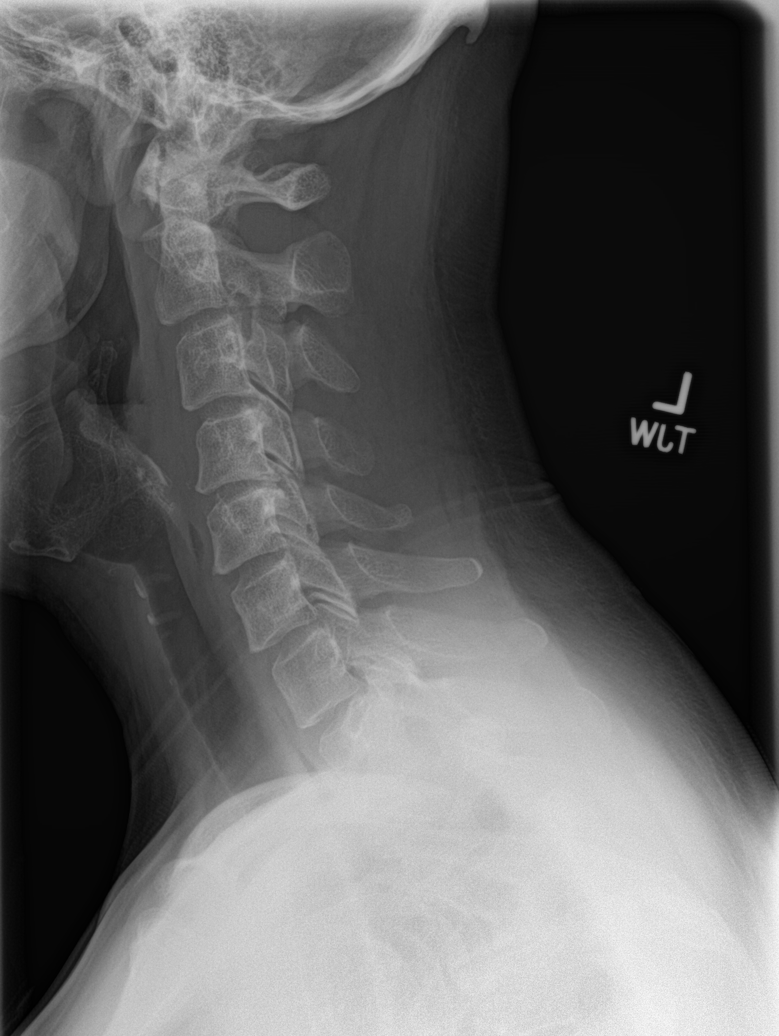

[c-spine obl (1 of 2)]
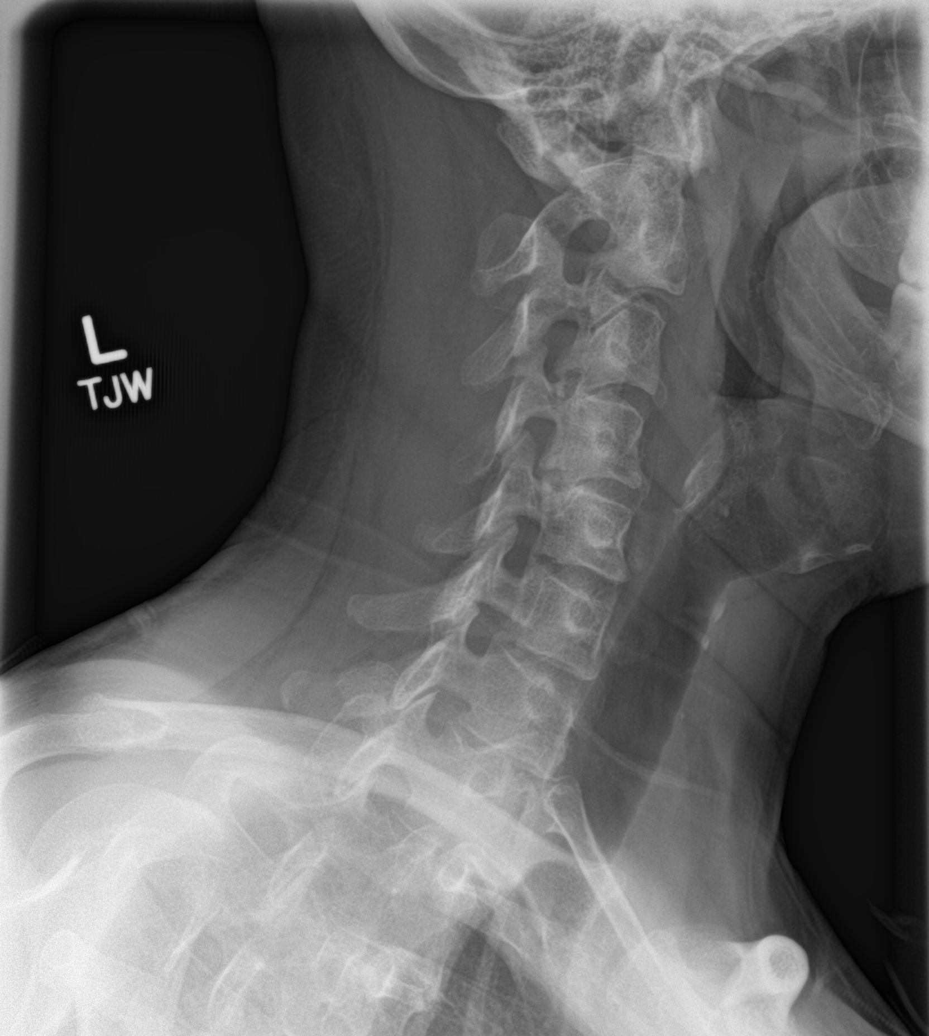

[c-spine obl (2 of 2)]
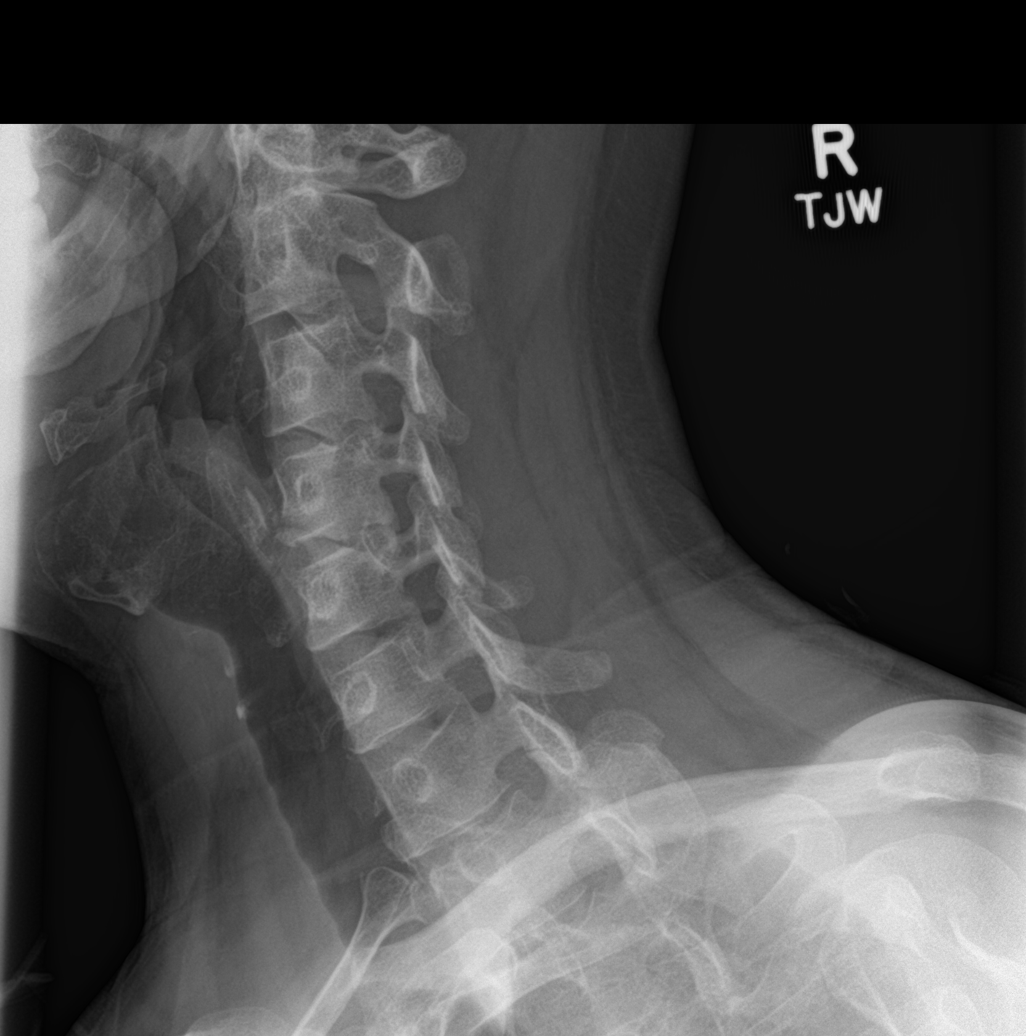

[c-spine ap]
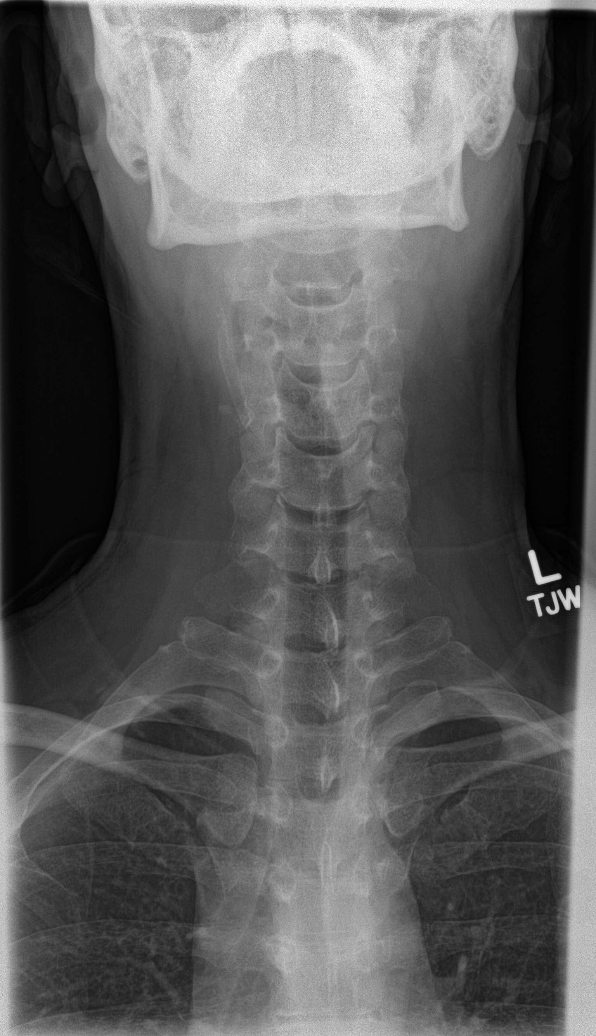

[c-spine open mouth]
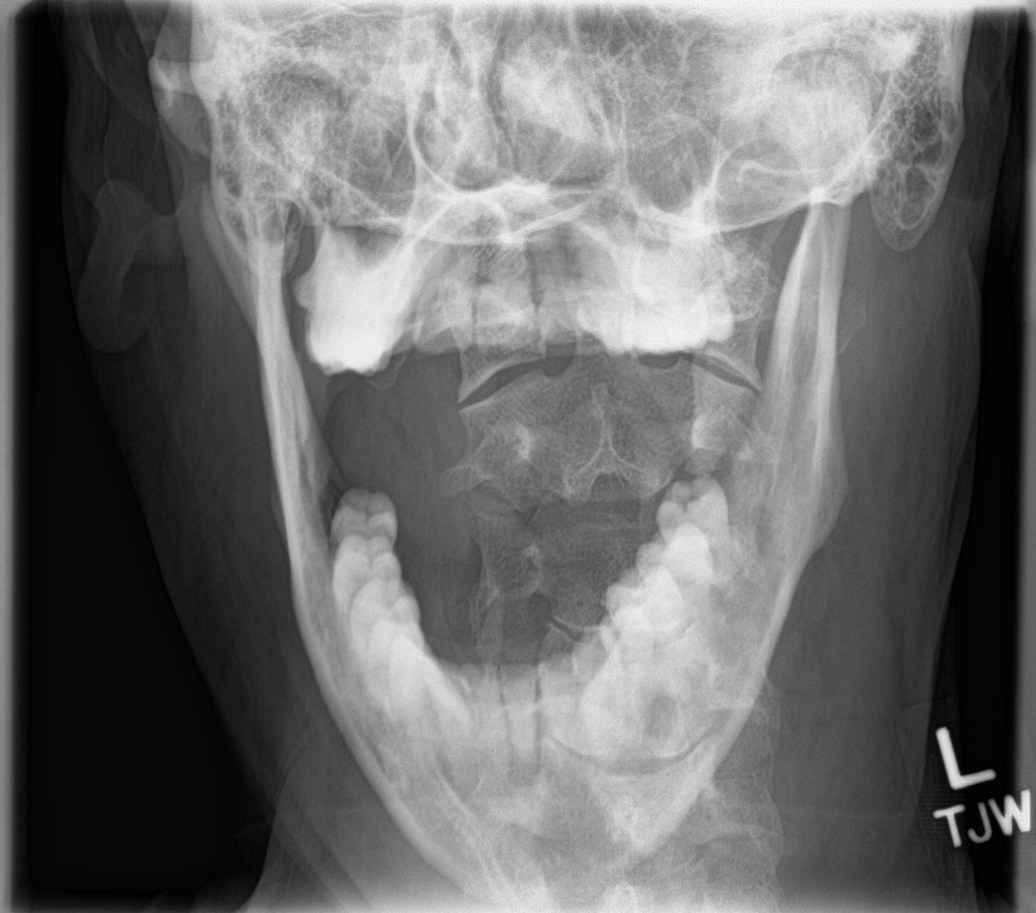

[c-spine swimmers]
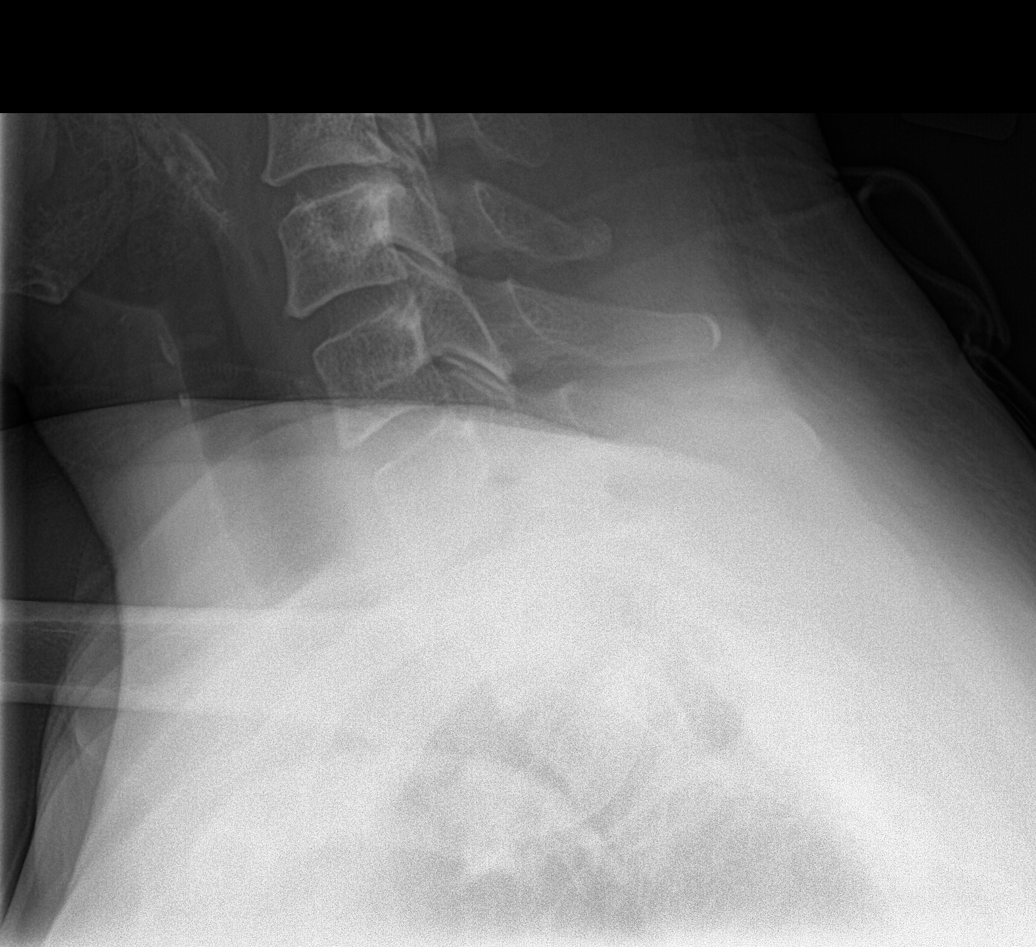

[6 of 6 positions shown; findings below may reference images not displayed]

FINDINGS: Mild to moderate posterior spur formation at the C3-4 level and
moderate posterior spur formation at the C4-5 level. Minimal
anterior spur formation at the C4-5 and C5-6 levels with mild disc
space narrowing at the C4-5 level. Uncinate spurs producing
mild-to-moderate foraminal stenosis on the right at the C3-4 level
and moderate neural foraminal stenosis on the right at the C4-5
level. Uncinate spurs are causing mild foraminal stenosis on the
left at the C3-4 level and moderate foraminal stenosis on the left
at the C4-5 level.
IMPRESSION: Degenerative changes at the C3-4 and C4-5 levels with foraminal
stenosis at these levels, as described above.

## 2020-04-24 NOTE — Progress Notes (Signed)
Subjective:    Patient ID: Alan Wise, male    DOB: [birth year], 49 y.o.   MRN: [id number]  HPI  Pt presents to the clinic today to [facility] care and for management of the conditions listed below. He would like his annual exam today.  DM 2: His last A1C was 9.2%, 02/2017. He is not checking his sugars. He is not taking any medications currently for this. He does not check his sugars routinely. His last eye exam was more than 2 years ago.  HLD: His last LDL was 113, triglycerides 184, 07/2016. He is not taking any cholesterol lowering medication at this time. He does not consume a low fat diet.  He also reports neck and mid back pain. This started 9 years ago, but worse in the last few months. He describes the pain as sharp and pulling. He denies numbness, tingling or weakness in his extremities. He takes Excedrin as needed with some relief. He had a minor head injury, elbow to the back of the head 9 years ago.  He also reports intermittent left thigh, left ankle and foot pain. He describes this pain as pressure and popping. He denies numbness, tingling or weakness to the area. He did have a left ankle injury many years ago. He has not taken anything OTC for this.  Flu: never Tetanus: unsure Pneumovax: never Covid: Janseen Colon Screening: never Vision Screening: as needed Dentist: as needed  Diet: He does eat meat. He consumes fruits and veggies. He does eat some fried foods. He drinks mostly water or soft drink. 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 Allergy   . Diabetes mellitus ([place])   . History of kidney stones   . Hyperlipidemia   . Renal disorder    kidney stones    Current Outpatient Medications  Medication Sig Dispense Refill  . FREESTYLE LITE test strip USE TWO TIMES DAILY TO TEST BLOOD SUGAR 50 each 11  . glipiZIDE (GLUCOTROL) 10 MG tablet TAKE 1 TABLET (10 MG TOTAL) BY MOUTH 2 (TWO) TIMES  DAILY BEFORE A MEAL. 60 tablet 0  . ondansetron (ZOFRAN) 4 MG tablet Take 1 tablet (4 mg total) by mouth daily as needed for nausea or vomiting. 10 tablet 0  . simvastatin (ZOCOR) 40 MG tablet Take 1 tablet (40 mg total) by mouth at bedtime. 30 tablet 3  . sitaGLIPtin (JANUVIA) 100 MG tablet Take 1 tablet (100 mg total) daily by mouth. MUST SCHEDULE ANNUAL PHYSICAL 30 tablet 1   No current facility-administered medications for this visit.    No Known Allergies  Family History  Problem Relation Age of Onset  . Hyperlipidemia Father   . Hypertension Father   . Diabetes Father   . Benign prostatic hyperplasia Father   . Stroke Maternal Grandmother   . Diabetes Maternal Grandmother   . Stroke Paternal Grandmother   . Hypertension Paternal Grandmother   . Kidney disease Neg Hx   . Prostate cancer Neg Hx     Social History   Socioeconomic History  . Marital status: Single    Spouse name: Not on file  . Number of children: Not on file  . Years of education: Not on file  . Highest education level: Not on file  Occupational History  . Not on file  Tobacco Use  . Smoking status: Never Smoker  . Smokeless tobacco: Never Used  Substance and Sexual Activity  .  Alcohol use: Yes    Alcohol/week: 0.0 standard drinks    Comment: rare  . Drug use: No  . Sexual activity: Not on file  Other Topics Concern  . Not on file  Social History Narrative  . Not on file   Social Determinants of Health   Financial Resource Strain:   . Difficulty of Paying Living Expenses: Not on file  Food Insecurity:   . Worried About Charity fundraiser in the Last Year: Not on file  . Ran Out of Food in the Last Year: Not on file  Transportation Needs:   . Lack of Transportation (Medical): Not on file  . Lack of Transportation (Non-Medical): Not on file  Physical Activity:   . Days of Exercise per Week: Not on file  . Minutes of Exercise per Session: Not on file  Stress:   . Feeling of Stress : Not  on file  Social Connections:   . Frequency of Communication with Friends and Family: Not on file  . Frequency of Social Gatherings with Friends and Family: Not on file  . Attends Religious Services: Not on file  . Active Member of Clubs or Organizations: Not on file  . Attends Archivist Meetings: Not on file  . Marital Status: Not on file  Intimate Partner Violence:   . Fear of Current or Ex-Partner: Not on file  . Emotionally Abused: Not on file  . Physically Abused: Not on file  . Sexually Abused: Not on file     Constitutional: Pt reports headaches. Denies fever, malaise, fatigue, or abrupt weight changes.  HEENT: Pt reports intermittent blurry vision. Denies eye pain, eye redness, ear pain, ringing in the ears, wax buildup, runny nose, nasal congestion, bloody nose, or sore throat. Respiratory: Denies difficulty breathing, shortness of breath, cough or sputum production.   Cardiovascular: Denies chest pain, chest tightness, palpitations or swelling in the hands or feet.  Gastrointestinal: Denies abdominal pain, bloating, constipation, diarrhea or blood in the stool.  GU: Pt reports decreased in erection fullness, but no erectile dysfunction. Denies urgency, frequency, pain with urination, burning sensation, blood in urine, odor or discharge. Musculoskeletal: Pt reports chronic neck pain, upper back pain, left thigh pain, left ankle and foot pain. Denies decrease in range of motion, difficulty with gait, muscle pain or joint swelling.  Skin: Denies redness, rashes, lesions or ulcercations.  Neurological: Denies dizziness, difficulty with memory, difficulty with speech or problems with balance and coordination.  Psych: Denies anxiety, depression, SI/HI.  No other specific complaints in a complete review of systems (except as listed in HPI above).     Objective:   Physical Exam  BP 118/86 (BP Location: Left Arm, Patient Position: Sitting, Cuff Size: Large)   Pulse 87    Temp 97.8 F (36.6 C)   Ht 5\' 8"  (1.727 m)   Wt 160 lb (72.6 kg)   SpO2 98%   BMI 24.33 kg/m   Wt Readings from Last 3 Encounters:  03/03/17 173 lb (78.5 kg)  08/19/16 175 lb 8 oz (79.6 kg)  07/22/16 174 lb (78.9 kg)    General: Appears his stated age, well developed, well nourished in NAD. Skin: Warm, dry and intact. No ulcerations noted. HEENT: Head: normal shape and size; Eyes: sclera white, no icterus, conjunctiva pink, PERRLA and EOMs intact;  Neck:  Neck supple, trachea midline. No masses, lumps or thyromegaly present.  Cardiovascular: Normal rate and rhythm. S1,S2 noted.  No murmur, rubs or gallops noted.  No JVD or BLE edema. No carotid bruits noted. Pulmonary/Chest: Normal effort and positive vesicular breath sounds. No respiratory distress. No wheezes, rales or ronchi noted.  Abdomen: Soft and nontender. Normal bowel sounds. No distention or masses noted. Liver, spleen and kidneys non palpable. Musculoskeletal: Normal flexion, extension, rotation and lateral bending of the cervical spine. Pain with palpation over the distal cervical spine. No signs of joint swelling. No difficulty with gait.  Neurological: Alert and oriented. Cranial nerves II-XII grossly intact. Coordination normal.  Psychiatric: Mood and affect normal. Behavior is normal. Judgment and thought content normal.   EKG:  BMET    Component Value Date/Time   NA 137 03/03/2017 1314   NA 138 05/04/2012 1815   K 3.7 03/03/2017 1314   K 3.5 05/04/2012 1815   CL 99 (L) 03/03/2017 1314   CL 99 05/04/2012 1815   CO2 24 03/03/2017 1314   CO2 29 05/04/2012 1815   GLUCOSE 294 (H) 03/03/2017 1314   GLUCOSE 69 05/04/2012 1815   BUN 16 03/03/2017 1314   BUN 12 05/04/2012 1815   CREATININE 0.87 03/03/2017 1314   CREATININE 1.06 05/04/2012 1815   CALCIUM 9.7 03/03/2017 1314   CALCIUM 9.2 05/04/2012 1815   GFRNONAA >60 03/03/2017 1314   GFRNONAA >60 05/04/2012 1815   GFRAA >60 03/03/2017 1314   GFRAA >60  05/04/2012 1815    Lipid Panel     Component Value Date/Time   CHOL 179 08/19/2016 1322   TRIG 184.0 (H) 08/19/2016 1322   HDL 29.30 (L) 08/19/2016 1322   CHOLHDL 6 08/19/2016 1322   VLDL 36.8 08/19/2016 1322   LDLCALC 113 (H) 08/19/2016 1322    CBC    Component Value Date/Time   WBC 16.3 (H) 03/03/2017 1314   RBC 6.11 (H) 03/03/2017 1314   HGB 18.0 03/03/2017 1314   HGB 16.4 05/04/2012 1815   HCT 51.5 03/03/2017 1314   HCT 48.4 05/04/2012 1815   PLT 271 03/03/2017 1314   PLT 261 05/04/2012 1815   MCV 84.3 03/03/2017 1314   MCV 86 05/04/2012 1815   MCH 29.4 03/03/2017 1314   MCHC 34.8 03/03/2017 1314   RDW 12.9 03/03/2017 1314   RDW 12.7 05/04/2012 1815   LYMPHSABS 2.4 01/18/2015 0713   MONOABS 0.5 01/18/2015 0713   EOSABS 0.2 01/18/2015 0713   BASOSABS 0.1 01/18/2015 0713    Hgb A1C Lab Results  Component Value Date   HGBA1C 9.2 (H) 08/19/2016            Assessment & Plan:   Preventative Health Maintenance:  He declines flu shot He declines tetanus shot He declines pneumovax Covid vaccine UTD He declines colon cancer screening Encouraged him to consume a balanced diet and exercise regimen Advised him to see an eye doctor and dentist annually Will check CBC, CMET, Lipid, A1C, urine microalbumin today  Multiple Joint Pain, Chronic Neck Pain:  Xray cervical spine today Continue Excedrin as needed for now  Will follow up after labs, return precautions discussed Webb Silversmith, NP This visit occurred during the SARS-CoV-2 public health emergency.  Safety protocols were in place, including screening questions prior to the visit, additional usage of staff PPE, and extensive cleaning of exam room while observing appropriate contact time as indicated for disinfecting solutions.

## 2020-04-24 NOTE — Patient Instructions (Signed)

## 2020-04-24 NOTE — Assessment & Plan Note (Signed)
CMET and Lipid profile today Encouraged him to consume a low fat diet

## 2020-04-24 NOTE — Assessment & Plan Note (Addendum)
CBC, CMET, Lipid, A1C and urine microalbumin today Will let you know if we need to start medication therapy Encouraged him to consume a low carb diet Encouraged routine foot exams Encouraged routine eye exams

## 2020-04-25 LAB — CBC
HCT: 46.1 % (ref 39.0–52.0)
Hemoglobin: 16 g/dL (ref 13.0–17.0)
MCHC: 34.7 g/dL (ref 30.0–36.0)
MCV: 83.9 fl (ref 78.0–100.0)
Platelets: 257 10*3/uL (ref 150.0–400.0)
RBC: 5.5 Mil/uL (ref 4.22–5.81)
RDW: 12.7 % (ref 11.5–15.5)
WBC: 7.2 10*3/uL (ref 4.0–10.5)

## 2020-04-25 LAB — COMPREHENSIVE METABOLIC PANEL
ALT: 28 U/L (ref 0–53)
AST: 15 U/L (ref 0–37)
Albumin: 4.5 g/dL (ref 3.5–5.2)
Alkaline Phosphatase: 132 U/L — ABNORMAL HIGH (ref 39–117)
BUN: 12 mg/dL (ref 6–23)
CO2: 30 mEq/L (ref 19–32)
Calcium: 9.4 mg/dL (ref 8.4–10.5)
Chloride: 98 mEq/L (ref 96–112)
Creatinine, Ser: 0.9 mg/dL (ref 0.40–1.50)
GFR: 101.32 mL/min (ref >60.00)
Glucose, Bld: 216 mg/dL — ABNORMAL HIGH (ref 70–99)
Potassium: 4.5 mEq/L (ref 3.5–5.1)
Sodium: 134 mEq/L — ABNORMAL LOW (ref 135–145)
Total Bilirubin: 0.5 mg/dL (ref 0.2–1.2)
Total Protein: 7.3 g/dL (ref 6.0–8.3)

## 2020-04-25 LAB — LIPID PANEL
Cholesterol: 207 mg/dL — ABNORMAL HIGH (ref 0–200)
HDL: 31 mg/dL — ABNORMAL LOW (ref >39.00)
NonHDL: 175.66
Total CHOL/HDL Ratio: 7
Triglycerides: 235 mg/dL — ABNORMAL HIGH (ref 0.0–149.0)
VLDL: 47 mg/dL — ABNORMAL HIGH (ref 0.0–40.0)

## 2020-04-25 LAB — HEMOGLOBIN A1C: Hgb A1c MFr Bld: 9.7 % — ABNORMAL HIGH (ref 4.6–6.5)

## 2020-04-25 LAB — MICROALBUMIN / CREATININE URINE RATIO
Creatinine,U: 113.1 mg/dL
Microalb Creat Ratio: 1.6 mg/g (ref 0.0–30.0)
Microalb, Ur: 1.8 mg/dL (ref 0.0–1.9)

## 2020-04-25 LAB — LDL CHOLESTEROL, DIRECT: Direct LDL: 159 mg/dL

## 2020-04-26 NOTE — Progress Notes (Signed)
Lvm for patient to call back about his lab results.

## 2020-05-03 ENCOUNTER — Encounter: Payer: Self-pay | Admitting: Internal Medicine

## 2020-05-08 MED ORDER — ATORVASTATIN CALCIUM 10 MG PO TABS: 10.0000 mg | ORAL_TABLET | Freq: Every day | ORAL | 0 refills | Status: DC

## 2020-05-08 MED ORDER — GLIPIZIDE 10 MG PO TABS: 10.0000 mg | ORAL_TABLET | Freq: Two times a day (BID) | ORAL | 0 refills | Status: DC

## 2020-05-16 ENCOUNTER — Other Ambulatory Visit: Payer: Self-pay

## 2020-05-16 ENCOUNTER — Emergency Department
Admission: EM | Admit: 2020-05-16 | Discharge: 2020-05-16 | Disposition: A | Discharge status: Home / Self Care | Payer: BC Managed Care – PPO | Attending: Emergency Medicine | Admitting: Emergency Medicine

## 2020-05-16 ENCOUNTER — Emergency Department: Payer: BC Managed Care – PPO

## 2020-05-16 ENCOUNTER — Encounter: Payer: Self-pay | Admitting: Medical Oncology

## 2020-05-16 DIAGNOSIS — E1165 Type 2 diabetes mellitus with hyperglycemia: Secondary | ICD-10-CM | POA: Insufficient documentation

## 2020-05-16 DIAGNOSIS — K2100 Gastro-esophageal reflux disease with esophagitis, without bleeding: Secondary | ICD-10-CM | POA: Diagnosis not present

## 2020-05-16 DIAGNOSIS — R079 Chest pain, unspecified: Secondary | ICD-10-CM | POA: Diagnosis not present

## 2020-05-16 DIAGNOSIS — Z7984 Long term (current) use of oral hypoglycemic drugs: Secondary | ICD-10-CM | POA: Insufficient documentation

## 2020-05-16 DIAGNOSIS — R072 Precordial pain: Secondary | ICD-10-CM | POA: Diagnosis present

## 2020-05-16 LAB — BASIC METABOLIC PANEL
Anion gap: 10 (ref 5–15)
BUN: 12 mg/dL (ref 6–20)
CO2: 31 mmol/L (ref 22–32)
Calcium: 10.3 mg/dL (ref 8.9–10.3)
Chloride: 95 mmol/L — ABNORMAL LOW (ref 98–111)
Creatinine, Ser: 0.9 mg/dL (ref 0.61–1.24)
GFR, Estimated: >60 mL/min (ref >60)
Glucose, Bld: 189 mg/dL — ABNORMAL HIGH (ref 70–99)
Potassium: 4.5 mmol/L (ref 3.5–5.1)
Sodium: 136 mmol/L (ref 135–145)

## 2020-05-16 LAB — CBC
HCT: 47.9 % (ref 39.0–52.0)
Hemoglobin: 16.9 g/dL (ref 13.0–17.0)
MCH: 29 pg (ref 26.0–34.0)
MCHC: 35.3 g/dL (ref 30.0–36.0)
MCV: 82.3 fL (ref 80.0–100.0)
Platelets: 284 10*3/uL (ref 150–400)
RBC: 5.82 MIL/uL — ABNORMAL HIGH (ref 4.22–5.81)
RDW: 11.9 % (ref 11.5–15.5)
WBC: 8.6 10*3/uL (ref 4.0–10.5)
nRBC: 0 % (ref 0.0–0.2)

## 2020-05-16 LAB — TROPONIN I (HIGH SENSITIVITY)
Troponin I (High Sensitivity): 9 ng/L
Troponin I (High Sensitivity): 9 ng/L

## 2020-05-16 IMAGING — CR DG CHEST 2V
1 series · 2 of 2 positions shown · non-contrast
Comparison: [DATE]

CLINICAL DATA: Chest pain and chest pressure 3 days ago with
excessive salivation and nausea

EXAM:
CHEST - 2 VIEW

[Series 1: dg chest 2 view · 0.14mm/px · 2 of 2 slices shown]
[im 1/2]
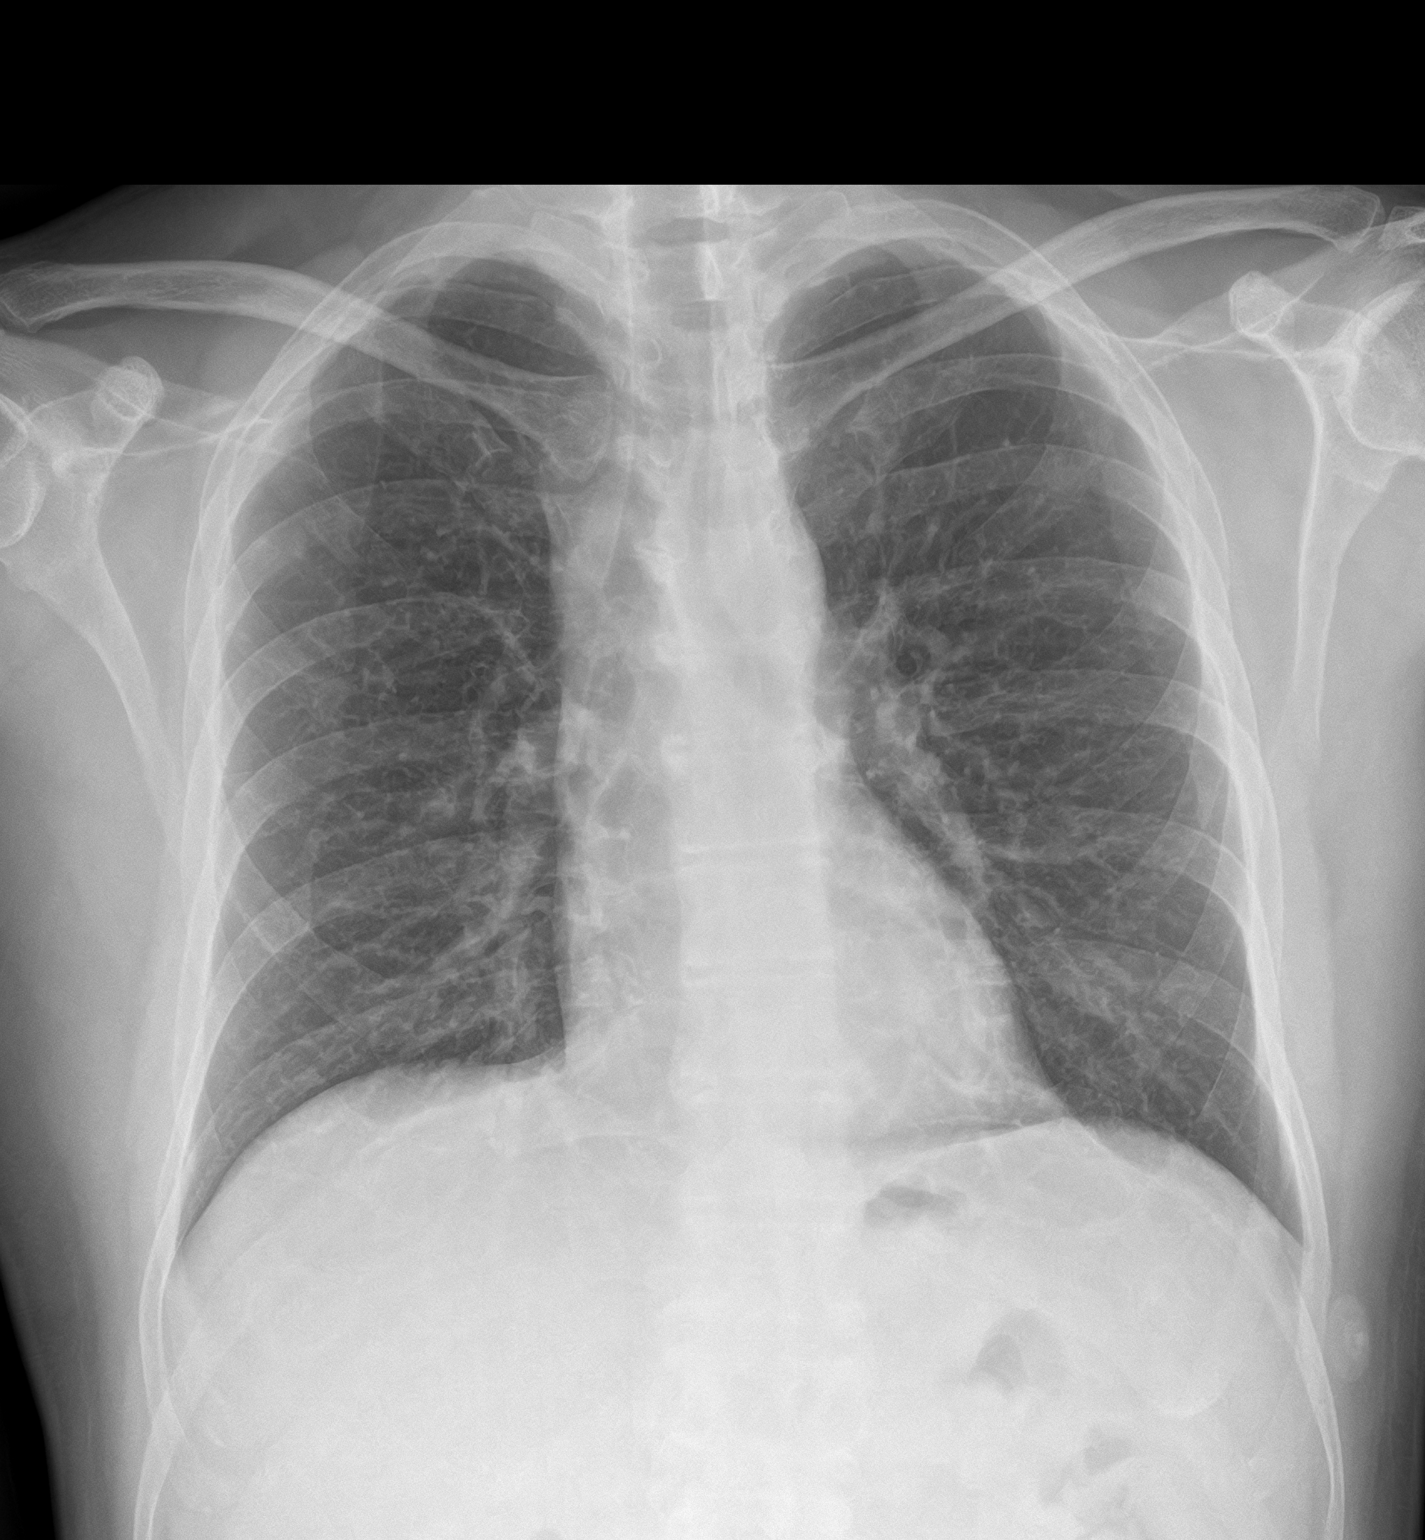
[im 2/2]
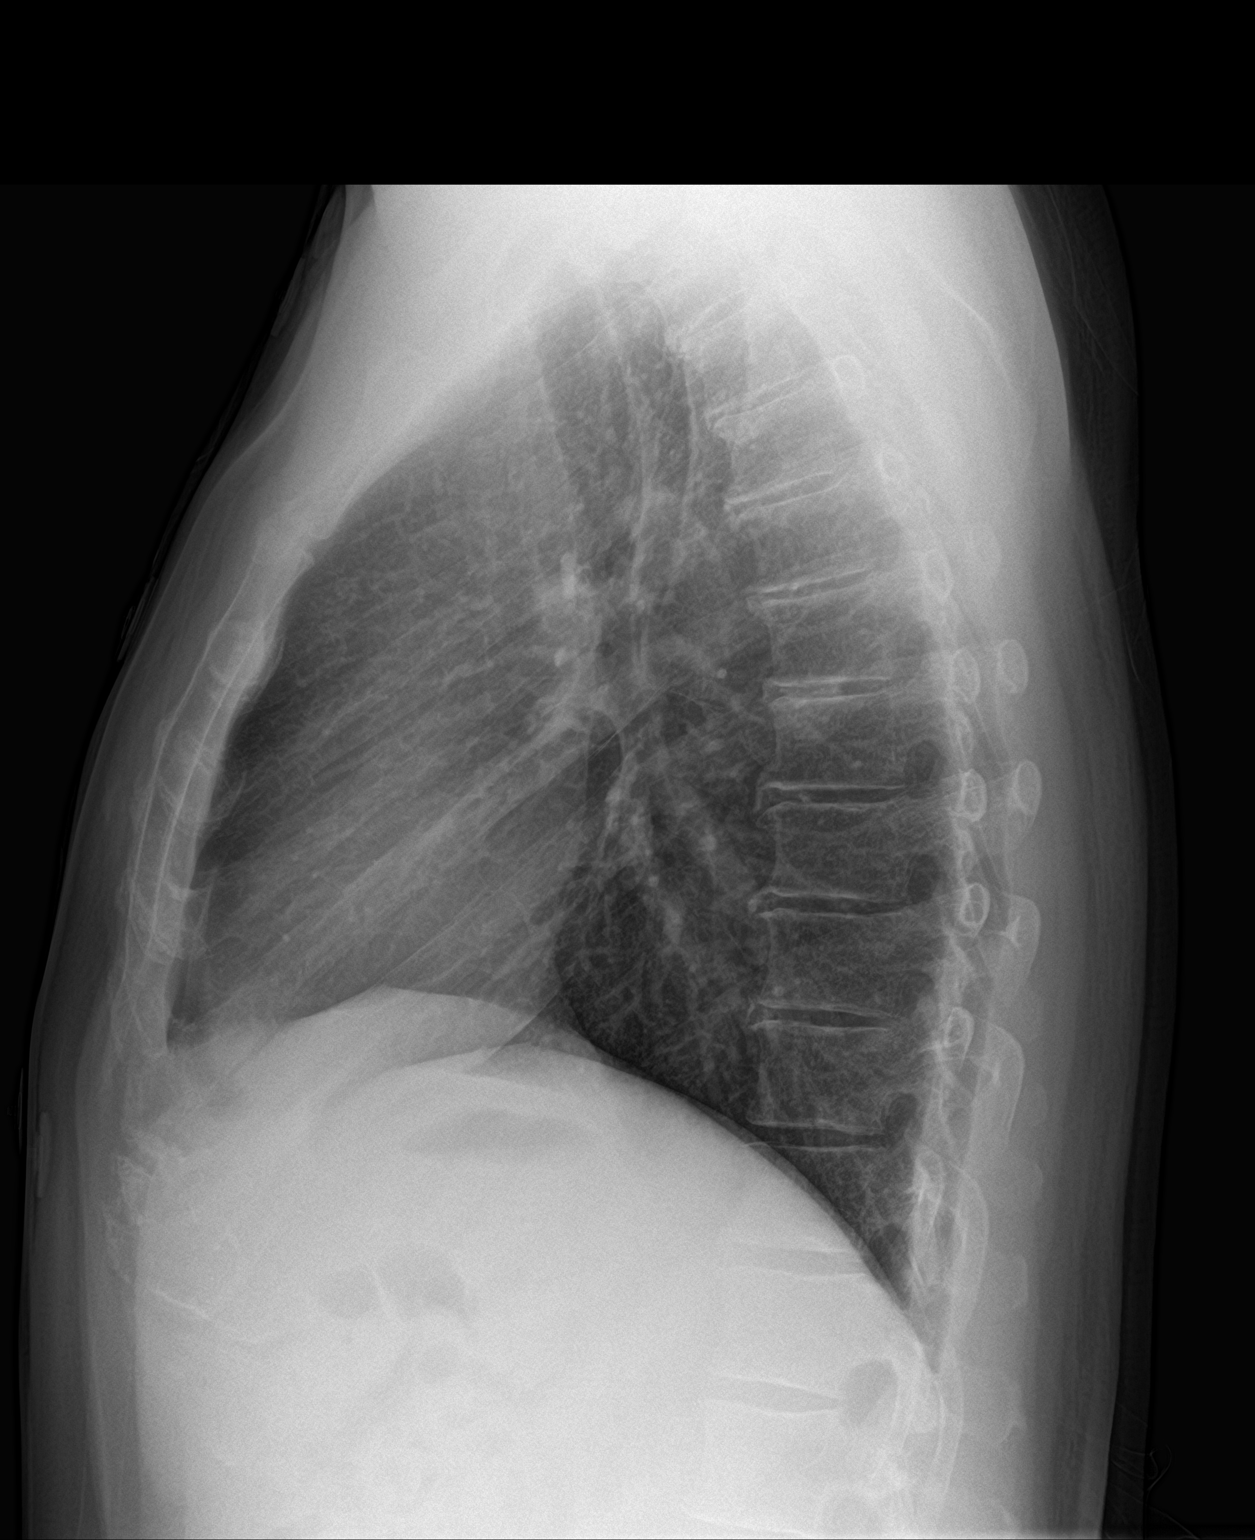

[2 of 2 positions shown; findings below may reference images not displayed]

FINDINGS: Tracheal air column is midline. Cardiomediastinal contours and hilar
structures are normal.

Lungs are clear. No sign of effusion. No consolidation. On limited
assessment there is no acute skeletal process.
IMPRESSION: No acute cardiopulmonary disease.

## 2020-05-16 MED ORDER — ONDANSETRON HCL 4 MG PO TABS: 4.0000 mg | ORAL_TABLET | Freq: Three times a day (TID) | ORAL | 0 refills | Status: DC | PRN

## 2020-05-16 MED ORDER — PANTOPRAZOLE SODIUM 40 MG PO TBEC: 40.0000 mg | DELAYED_RELEASE_TABLET | Freq: Every day | ORAL | 11 refills | Status: DC

## 2020-05-16 NOTE — ED Triage Notes (Signed)
Pt reports that he began having chest pressure 3 days ago with excessive salivation and nausea.

## 2020-05-16 NOTE — ED Provider Notes (Signed)
[facility] Emergency Department Provider Note  ____________________________________________   First MD Initiated Contact with Patient 05/16/20 1630     (approximate)  I have reviewed the triage vital signs and the nursing notes.   HISTORY  Chief Complaint Chest Pain   HPI Alan Wise is a 49 y.o. male with past medical history of HTN, kidney stones, DM, and environmental allergies who presents for assessment approximately 2 weeks of worsening substernal chest pressure and burning as well as sour taste in his mouth and excess elevation at night which is woken up several times.  Patient states he had a couple days of constipation but no severe abdominal pain, back pain, headache area, rash, extremity pain, urinary symptoms.  No prior similar episodes or clear alleviating or aggravating factors.  Patient denies any significant NSAID use, EtOH use, or illegal drug use.  No fevers, shortness of breath, or cough.         Past Medical History:  Diagnosis Date  . Allergy   . Diabetes mellitus (Alan Wise)   . History of kidney stones   . Hyperlipidemia   . Renal disorder    kidney stones    Patient Active Problem List   Diagnosis Date Noted  . Type 2 diabetes mellitus with hyperglycemia (Alan Wise) 06/12/2014  . HLD (hyperlipidemia) 06/12/2014    Past Surgical History:  Procedure Laterality Date  . CYSTOSCOPY WITH STENT PLACEMENT Right 01/25/2016   Procedure: CYSTOSCOPY WITH STENT PLACEMENT;  Surgeon: Nickie Retort, MD;  Location: [facility] ORS;  Service: Urology;  Laterality: Right;  . KNEE ARTHROSCOPY W/ MENISCAL REPAIR Right 1990  . SKIN CANCER EXCISION  2014  . URETEROSCOPY WITH HOLMIUM LASER LITHOTRIPSY Right 01/25/2016   Procedure: URETEROSCOPY WITH HOLMIUM LASER LITHOTRIPSY;  Surgeon: Nickie Retort, MD;  Location: [facility] ORS;  Service: Urology;  Laterality: Right;  . URETHRAL STRICTURE DILATATION    . WISDOM TOOTH EXTRACTION      Prior to Admission  medications   Medication Sig Start Date End Date Taking? Authorizing Provider  atorvastatin (LIPITOR) 10 MG tablet Take 1 tablet (10 mg total) by mouth daily. 05/08/20   Jearld Fenton, NP  FREESTYLE LITE test strip USE TWO TIMES DAILY TO TEST BLOOD SUGAR 11/26/15   Jearld Fenton, NP  glipiZIDE (GLUCOTROL) 10 MG tablet Take 1 tablet (10 mg total) by mouth 2 (two) times daily before a meal. 05/08/20   Baity, Coralie Keens, NP  ondansetron (ZOFRAN) 4 MG tablet Take 1 tablet (4 mg total) by mouth every 8 (eight) hours as needed for up to 10 doses for nausea or vomiting. 05/16/20   Lucrezia Starch, MD  pantoprazole (PROTONIX) 40 MG tablet Take 1 tablet (40 mg total) by mouth daily. 05/16/20 05/16/21  Lucrezia Starch, MD    Allergies Patient has no known allergies.  Family History  Problem Relation Age of Onset  . Hyperlipidemia Father   . Hypertension Father   . Diabetes Father   . Benign prostatic hyperplasia Father   . Stroke Maternal Grandmother   . Diabetes Maternal Grandmother   . Stroke Paternal Grandmother   . Hypertension Paternal Grandmother   . Kidney disease Neg Hx   . Prostate cancer Neg Hx     Social History Social History   Tobacco Use  . Smoking status: Never Smoker  . Smokeless tobacco: Never Used  Substance Use Topics  . Alcohol use: Yes    Alcohol/week: 0.0 standard drinks  Comment: rare  . Drug use: No    Review of Systems  Review of Systems  Constitutional: Negative for chills and fever.  HENT: Negative for sore throat.   Eyes: Negative for pain.  Respiratory: Negative for cough and stridor.   Cardiovascular: Positive for chest pain.  Gastrointestinal: Positive for nausea. Negative for vomiting.  Genitourinary: Negative for dysuria.  Musculoskeletal: Negative for myalgias.  Skin: Negative for rash.  Neurological: Negative for seizures, loss of consciousness and headaches.  Psychiatric/Behavioral: Negative for suicidal ideas.  All other systems  reviewed and are negative.     ____________________________________________   PHYSICAL EXAM:  VITAL SIGNS: ED Triage Vitals  Enc Vitals Group     BP 05/16/20 1416 (!) 156/100     Pulse Rate 05/16/20 1415 84     Resp 05/16/20 1415 18     Temp 05/16/20 1415 98.7 F (37.1 C)     Temp Source 05/16/20 1415 Oral     SpO2 05/16/20 1415 97 %     Weight 05/16/20 1412 156 lb (70.8 kg)     Height 05/16/20 1412 5\' 8"  (1.727 m)     Head Circumference --      Peak Flow --      Pain Score 05/16/20 1412 3     Pain Loc --      Pain Edu? --      Excl. in [place]? --    Vitals:   05/16/20 1416 05/16/20 1700  BP: (!) 156/100   Pulse:  79  Resp:  19  Temp:    SpO2:  99%   Physical Exam Vitals and nursing note reviewed.  Constitutional:      Appearance: He is well-developed.  HENT:     Head: Normocephalic and atraumatic.     Right Ear: External ear normal.     Left Ear: External ear normal.     Nose: Nose normal.     Mouth/Throat:     Mouth: Mucous membranes are moist.  Eyes:     Conjunctiva/sclera: Conjunctivae normal.  Cardiovascular:     Rate and Rhythm: Normal rate and regular rhythm.     Heart sounds: No murmur heard.   Pulmonary:     Effort: Pulmonary effort is normal. No respiratory distress.     Breath sounds: Normal breath sounds.  Abdominal:     Palpations: Abdomen is soft.     Tenderness: There is no abdominal tenderness.  Musculoskeletal:     Cervical back: Neck supple.  Skin:    General: Skin is warm and dry.     Capillary Refill: Capillary refill takes less than 2 seconds.  Neurological:     Mental Status: He is alert and oriented to person, place, and time.  Psychiatric:        Mood and Affect: Mood normal.      ____________________________________________   LABS (all labs ordered are listed, but only abnormal results are displayed)  Labs Reviewed  BASIC METABOLIC PANEL - Abnormal; Notable for the following components:      Result Value   Chloride 95  (*)    Glucose, Bld 189 (*)    All other components within normal limits  CBC - Abnormal; Notable for the following components:   RBC 5.82 (*)    All other components within normal limits  TROPONIN I (HIGH SENSITIVITY)  TROPONIN I (HIGH SENSITIVITY)   ____________________________________________  EKG  Sinus rhythm with a ventricular rate of 84, normal axis, unremarkable intervals, nonspecific  ST change in lead III and some wandering baseline in aVF without other clear evidence of acute ischemia. ____________________________________________  RADIOLOGY  ED MD interpretation: No overt edema, focal consolidation, pneumothorax, large effusion, or significant cardiomegaly.  No other acute intrathoracic process.  Official radiology report(s): DG Chest 2 View  Result Date: 05/16/2020 CLINICAL DATA:  Chest pain and chest pressure 3 days ago with excessive salivation and nausea EXAM: CHEST - 2 VIEW COMPARISON:  March 03, 2017 FINDINGS: Tracheal air column is midline. Cardiomediastinal contours and hilar structures are normal. Lungs are clear. No sign of effusion. No consolidation. On limited assessment there is no acute skeletal process. IMPRESSION: No acute cardiopulmonary disease. Electronically Signed   By: Zetta Bills M.D.   On: 05/16/2020 14:49    ____________________________________________   PROCEDURES  Procedure(s) performed (including Critical Care):  Procedures   ____________________________________________   INITIAL IMPRESSION / ASSESSMENT AND PLAN / ED COURSE        Patient presents with [place] to history exam for assessment approximately 2 weeks of worsening substernal chest discomfort associated with pork sour taste in his mouth and excessive elevation that has been waking up at night.  Patient denies any respiratory symptoms.  He has not been vomiting but does feel constipated states that he vomited 3 days.  Differential occludes with is not limited to ACS, PE,  pneumothorax, pneumonia, reflux, GERD, SBO, and esophagitis.  Low suspicion for ACS given nonelevated troponin x2 obtained over 2 hours with otherwise reassuring EKG.  Chest x-ray shows no evidence of pneumothorax or other acute intrathoracic process including pneumonia.  CBC is unremarkable.  BMP shows evidence of hyperglycemia with glucose of 189 which is consistent patient's known history of diabetes.  Low suspicion for PE as patient is PERC negative.  Very low suspicion for SBO as patient is passing gas and has not actually vomited his abdomen is soft and nontender throughout.  Overall description of symptoms are most consistent with likely reflux and esophagitis.  Rx written for Zofran Protonix.  Discharge stable condition.  Strict precautions advised and discussed.  Also advised patient to take daily MiraLAX until constipation is relieved.    ____________________________________________   FINAL CLINICAL IMPRESSION(S) / ED DIAGNOSES  Final diagnoses:  Chest pain, unspecified type  Gastroesophageal reflux disease with esophagitis, unspecified whether hemorrhage    Medications - No data to display   ED Discharge Orders         Ordered    pantoprazole (PROTONIX) 40 MG tablet  Daily        05/16/20 1656    ondansetron (ZOFRAN) 4 MG tablet  Every 8 hours PRN        05/16/20 1656           Note:  This document was prepared using Dragon voice recognition software and may include unintentional dictation errors.   Lucrezia Starch, MD 05/16/20 1705

## 2020-08-05 ENCOUNTER — Other Ambulatory Visit: Payer: Self-pay | Admitting: Internal Medicine

## 2020-08-30 ENCOUNTER — Encounter: Payer: Self-pay | Admitting: Internal Medicine

## 2020-08-30 ENCOUNTER — Other Ambulatory Visit: Payer: Self-pay

## 2020-08-30 ENCOUNTER — Ambulatory Visit: Payer: BC Managed Care – PPO | Admitting: Internal Medicine

## 2020-08-30 VITALS — BP 122/72 | HR 62 | Temp 96.9°F | Wt 162.0 lb

## 2020-08-30 DIAGNOSIS — E1165 Type 2 diabetes mellitus with hyperglycemia: Secondary | ICD-10-CM

## 2020-08-30 DIAGNOSIS — K219 Gastro-esophageal reflux disease without esophagitis: Secondary | ICD-10-CM | POA: Diagnosis not present

## 2020-08-30 DIAGNOSIS — E782 Mixed hyperlipidemia: Secondary | ICD-10-CM | POA: Diagnosis not present

## 2020-08-30 DIAGNOSIS — N521 Erectile dysfunction due to diseases classified elsewhere: Secondary | ICD-10-CM

## 2020-08-30 DIAGNOSIS — N529 Male erectile dysfunction, unspecified: Secondary | ICD-10-CM | POA: Insufficient documentation

## 2020-08-30 MED ORDER — TADALAFIL 5 MG PO TABS: 5.0000 mg | ORAL_TABLET | Freq: Every day | ORAL | 2 refills | Status: DC

## 2020-08-30 NOTE — Progress Notes (Signed)
Subjective:    Patient ID: Alan Wise, male    DOB: [birth year], 50 y.o.   MRN: [id number]  HPI  Patient presents the clinic today for follow-up of chronic conditions.  DM2: His last A1c was 9.7, 04/2020.  He is taking Glipizide as prescribed.  He does not routinely check his sugars.  He does not check his feet routinely.  His last eye exam was > 10 years ago.  Flu never.  Pneumovax never.  Covid- [place].  HLD: His last LDL was 159, 04/2020.  He denies myalgias on Atorvastatin.  He does not consume a low-fat diet.  GERD: He denies breakthrough on Pantoprazole.  There is no upper GI on file.  ED: He reports he is unable to initiate an erection at this time. He is not seeing urology.  Review of Systems      Past Medical History:  Diagnosis Date  . Allergy   . Diabetes mellitus ([place])   . History of kidney stones   . Hyperlipidemia   . Renal disorder    kidney stones    Current Outpatient Medications  Medication Sig Dispense Refill  . atorvastatin (LIPITOR) 10 MG tablet TAKE 1 TABLET BY MOUTH EVERY DAY 30 tablet 0  . FREESTYLE LITE test strip USE TWO TIMES DAILY TO TEST BLOOD SUGAR 50 each 11  . glipiZIDE (GLUCOTROL) 10 MG tablet TAKE 1 TABLET (10 MG TOTAL) BY MOUTH 2 (TWO) TIMES DAILY BEFORE A MEAL. 60 tablet 0  . ondansetron (ZOFRAN) 4 MG tablet Take 1 tablet (4 mg total) by mouth every 8 (eight) hours as needed for up to 10 doses for nausea or vomiting. 10 tablet 0  . pantoprazole (PROTONIX) 40 MG tablet Take 1 tablet (40 mg total) by mouth daily. 30 tablet 11   No current facility-administered medications for this visit.    No Known Allergies  Family History  Problem Relation Age of Onset  . Hyperlipidemia Father   . Hypertension Father   . Diabetes Father   . Benign prostatic hyperplasia Father   . Stroke Maternal Grandmother   . Diabetes Maternal Grandmother   . Stroke Paternal Grandmother   . Hypertension Paternal Grandmother   . Kidney disease Neg Hx    . Prostate cancer Neg Hx     Social History   Socioeconomic History  . Marital status: Married    Spouse name: Not on file  . Number of children: Not on file  . Years of education: Not on file  . Highest education level: Not on file  Occupational History  . Not on file  Tobacco Use  . Smoking status: Never Smoker  . Smokeless tobacco: Never Used  Substance and Sexual Activity  . Alcohol use: Yes    Alcohol/week: 0.0 standard drinks    Comment: rare  . Drug use: No  . Sexual activity: Not on file  Other Topics Concern  . Not on file  Social History Narrative  . Not on file   Social Determinants of Health   Financial Resource Strain: Not on file  Food Insecurity: Not on file  Transportation Needs: Not on file  Physical Activity: Not on file  Stress: Not on file  Social Connections: Not on file  Intimate Partner Violence: Not on file     Constitutional: Denies fever, malaise, fatigue, headache or abrupt weight changes.  HEENT: Denies eye pain, eye redness, ear pain, ringing in the ears, wax buildup, runny nose, nasal congestion, bloody nose, or  sore throat. Respiratory: Denies difficulty breathing, shortness of breath, cough or sputum production.   Cardiovascular: Denies chest pain, chest tightness, palpitations or swelling in the hands or feet.  Gastrointestinal: Denies abdominal pain, bloating, constipation, diarrhea or blood in the stool.  GU: Pt reports erectile dysfunction. Denies urgency, frequency, pain with urination, burning sensation, blood in urine, odor or discharge. Neurological: Denies dizziness, difficulty with memory, difficulty with speech or problems with balance and coordination.    No other specific complaints in a complete review of systems (except as listed in HPI above).  Objective:   Physical Exam  BP 122/72   Pulse 62   Temp (!) 96.9 F (36.1 C) (Temporal)   Wt 162 lb (73.5 kg)   SpO2 98%   BMI 24.63 kg/m   Wt Readings from Last 3  Encounters:  05/16/20 156 lb (70.8 kg)  04/24/20 160 lb (72.6 kg)  03/03/17 173 lb (78.5 kg)    General: Appears his stated age, well developed, well nourished in NAD. Skin: Warm, dry and intact. No ulcerations noted. HEENT: Head: normal shape and size; Eyes: sclera white, no icterus, conjunctiva pink, PERRLA and EOMs intact; Cardiovascular: Normal rate and rhythm. S1,S2 noted.  No murmur, rubs or gallops noted. No JVD or BLE edema. No carotid bruits noted. Pulmonary/Chest: Normal effort and positive vesicular breath sounds. No respiratory distress. No wheezes, rales or ronchi noted.  Neurological: Alert and oriented.   BMET    Component Value Date/Time   NA 136 05/16/2020 1416   NA 138 05/04/2012 1815   K 4.5 05/16/2020 1416   K 3.5 05/04/2012 1815   CL 95 (L) 05/16/2020 1416   CL 99 05/04/2012 1815   CO2 31 05/16/2020 1416   CO2 29 05/04/2012 1815   GLUCOSE 189 (H) 05/16/2020 1416   GLUCOSE 69 05/04/2012 1815   BUN 12 05/16/2020 1416   BUN 12 05/04/2012 1815   CREATININE 0.90 05/16/2020 1416   CREATININE 1.06 05/04/2012 1815   CALCIUM 10.3 05/16/2020 1416   CALCIUM 9.2 05/04/2012 1815   GFRNONAA >60 05/16/2020 1416   GFRNONAA >60 05/04/2012 1815   GFRAA >60 03/03/2017 1314   GFRAA >60 05/04/2012 1815    Lipid Panel     Component Value Date/Time   CHOL 207 (H) 04/24/2020 1458   TRIG 235.0 (H) 04/24/2020 1458   HDL 31.00 (L) 04/24/2020 1458   CHOLHDL 7 04/24/2020 1458   VLDL 47.0 (H) 04/24/2020 1458   LDLCALC 113 (H) 08/19/2016 1322    CBC    Component Value Date/Time   WBC 8.6 05/16/2020 1416   RBC 5.82 (H) 05/16/2020 1416   HGB 16.9 05/16/2020 1416   HGB 16.4 05/04/2012 1815   HCT 47.9 05/16/2020 1416   HCT 48.4 05/04/2012 1815   PLT 284 05/16/2020 1416   PLT 261 05/04/2012 1815   MCV 82.3 05/16/2020 1416   MCV 86 05/04/2012 1815   MCH 29.0 05/16/2020 1416   MCHC 35.3 05/16/2020 1416   RDW 11.9 05/16/2020 1416   RDW 12.7 05/04/2012 1815   LYMPHSABS  2.4 01/18/2015 0713   MONOABS 0.5 01/18/2015 0713   EOSABS 0.2 01/18/2015 0713   BASOSABS 0.1 01/18/2015 0713    Hgb A1C Lab Results  Component Value Date   HGBA1C 9.7 (H) 04/24/2020            Assessment & Plan:    Webb Silversmith, NP This visit occurred during the SARS-CoV-2 public health emergency.  Safety protocols were in  place, including screening questions prior to the visit, additional usage of staff PPE, and extensive cleaning of exam room while observing appropriate contact time as indicated for disinfecting solutions.

## 2020-08-30 NOTE — Assessment & Plan Note (Signed)
Pantoprazole wean not indicated CBC and CMET today

## 2020-08-30 NOTE — Assessment & Plan Note (Signed)
Will trial Cialis 5 mg daily dosing

## 2020-08-30 NOTE — Patient Instructions (Signed)

## 2020-08-30 NOTE — Assessment & Plan Note (Signed)
A1C, BMET today Will check urine microalbumin at next visit Encouraged him to consume a low carb diet Advised him to be more compliant with Glipizide Encouraged routine foot exams Referral to ophthalmology for eye exam He declines flu, pneumovax and covid booster

## 2020-08-30 NOTE — Assessment & Plan Note (Signed)
CMET and Lipid profile today Advised him to consume a low fat diet Advised him to be compliant with Atorvastatin

## 2020-08-31 ENCOUNTER — Telehealth: Payer: Self-pay

## 2020-08-31 LAB — COMPREHENSIVE METABOLIC PANEL
ALT: 33 U/L (ref 0–53)
AST: 19 U/L (ref 0–37)
Albumin: 4.5 g/dL (ref 3.5–5.2)
Alkaline Phosphatase: 108 U/L (ref 39–117)
BUN: 14 mg/dL (ref 6–23)
CO2: 32 mEq/L (ref 19–32)
Calcium: 9.4 mg/dL (ref 8.4–10.5)
Chloride: 100 mEq/L (ref 96–112)
Creatinine, Ser: 0.92 mg/dL (ref 0.40–1.50)
GFR: 98.44 mL/min (ref >60.00)
Glucose, Bld: 130 mg/dL — ABNORMAL HIGH (ref 70–99)
Potassium: 4.2 mEq/L (ref 3.5–5.1)
Sodium: 137 mEq/L (ref 135–145)
Total Bilirubin: 0.5 mg/dL (ref 0.2–1.2)
Total Protein: 7.3 g/dL (ref 6.0–8.3)

## 2020-08-31 LAB — LIPID PANEL
Cholesterol: 194 mg/dL (ref 0–200)
HDL: 28.4 mg/dL — ABNORMAL LOW (ref >39.00)
NonHDL: 165.61
Total CHOL/HDL Ratio: 7
Triglycerides: 303 mg/dL — ABNORMAL HIGH (ref 0.0–149.0)
VLDL: 60.6 mg/dL — ABNORMAL HIGH (ref 0.0–40.0)

## 2020-08-31 LAB — HEMOGLOBIN A1C: Hgb A1c MFr Bld: 8.9 % — ABNORMAL HIGH (ref 4.6–6.5)

## 2020-08-31 LAB — LDL CHOLESTEROL, DIRECT: Direct LDL: 140 mg/dL

## 2020-08-31 NOTE — Telephone Encounter (Signed)
Prior auth submitted via [email or web address] to [facility]

## 2020-09-03 ENCOUNTER — Other Ambulatory Visit: Payer: Self-pay | Admitting: Internal Medicine

## 2020-09-04 ENCOUNTER — Encounter: Payer: Self-pay | Admitting: Internal Medicine

## 2020-09-08 ENCOUNTER — Other Ambulatory Visit: Payer: Self-pay | Admitting: Internal Medicine

## 2020-12-05 ENCOUNTER — Other Ambulatory Visit: Payer: Self-pay

## 2020-12-05 ENCOUNTER — Encounter: Payer: Self-pay | Admitting: Internal Medicine

## 2020-12-05 ENCOUNTER — Ambulatory Visit: Payer: BC Managed Care – PPO | Admitting: Internal Medicine

## 2020-12-05 VITALS — BP 133/86 | HR 68 | Temp 97.5°F | Resp 17 | Ht 68.0 in | Wt 165.6 lb

## 2020-12-05 DIAGNOSIS — R569 Unspecified convulsions: Secondary | ICD-10-CM

## 2020-12-05 DIAGNOSIS — E782 Mixed hyperlipidemia: Secondary | ICD-10-CM | POA: Diagnosis not present

## 2020-12-05 DIAGNOSIS — Z566 Other physical and mental strain related to work: Secondary | ICD-10-CM

## 2020-12-05 DIAGNOSIS — E1165 Type 2 diabetes mellitus with hyperglycemia: Secondary | ICD-10-CM

## 2020-12-05 DIAGNOSIS — E663 Overweight: Secondary | ICD-10-CM | POA: Diagnosis not present

## 2020-12-05 DIAGNOSIS — G479 Sleep disorder, unspecified: Secondary | ICD-10-CM

## 2020-12-05 NOTE — Progress Notes (Signed)
Subjective:    Patient ID: Alan Wise, male    DOB: 22-Apr-1971, 50 y.o.   MRN: [id number]  HPI  Patient presents the clinic today with complaint of seizure-like activity.  He reports over the last 7 months he will have multiple episodes a day that exhibit themselves as difficulty focusing, facial stiffness, left arm pain, slow speech, drooling out of the left side of his mouth.  He reports these episodes last 10 to 20 seconds and then resolved.  He reports after these episodes he does feel very fatigued and it takes him a while to get back to his self.  He reports he is not currently sleeping well because his 73-year-old sleeps in the bed with him.  His 37-year-old and his wife both snore.  He also reports he is under a lot of stress at work.  He denies any syncopal episodes, sweating, nausea, vomiting, shortness of breath, chest pain with these episodes.  He has not taken any medications OTC for this.  He does have a history of uncontrolled HLD and DM2.  HLD: His last LDL was, triglycerides.  He denies myalgias on Atorvastatin.  He has been trying to consume a low-fat diet.  His last A1c was 8.9%, 08/2020.  He is taking Glipizide daily instead of twice daily as prescribed.  He does not check his sugars.  Review of Systems     Past Medical History:  Diagnosis Date   Allergy    Diabetes mellitus ([place])    History of kidney stones    Hyperlipidemia    Renal disorder    kidney stones    Current Outpatient Medications  Medication Sig Dispense Refill   atorvastatin (LIPITOR) 10 MG tablet Take 1 tablet (10 mg total) by mouth at bedtime. 90 tablet 1   FREESTYLE LITE test strip USE TWO TIMES DAILY TO TEST BLOOD SUGAR 50 each 11   glipiZIDE (GLUCOTROL) 10 MG tablet TAKE 1 TABLET (10 MG TOTAL) BY MOUTH 2 (TWO) TIMES DAILY BEFORE A MEAL. 180 tablet 0   ondansetron (ZOFRAN) 4 MG tablet Take 1 tablet (4 mg total) by mouth every 8 (eight) hours as needed for up to 10 doses for nausea or  vomiting. 10 tablet 0   pantoprazole (PROTONIX) 40 MG tablet Take 1 tablet (40 mg total) by mouth daily. 30 tablet 11   tadalafil (CIALIS) 5 MG tablet Take 1 tablet (5 mg total) by mouth daily. 30 tablet 2   No current facility-administered medications for this visit.    No Known Allergies  Family History  Problem Relation Age of Onset   Hyperlipidemia Father    Hypertension Father    Diabetes Father    Benign prostatic hyperplasia Father    Stroke Maternal Grandmother    Diabetes Maternal Grandmother    Stroke Paternal Grandmother    Hypertension Paternal Grandmother    Kidney disease Neg Hx    Prostate cancer Neg Hx     Social History   Socioeconomic History   Marital status: Married    Spouse name: Not on file   Number of children: Not on file   Years of education: Not on file   Highest education level: Not on file  Occupational History   Not on file  Tobacco Use   Smoking status: Never   Smokeless tobacco: Never  Substance and Sexual Activity   Alcohol use: Yes    Alcohol/week: 0.0 standard drinks    Comment: rare   Drug use:  No   Sexual activity: Not on file  Other Topics Concern   Not on file  Social History Narrative   Not on file   Social Determinants of Health   Financial Resource Strain: Not on file  Food Insecurity: Not on file  Transportation Needs: Not on file  Physical Activity: Not on file  Stress: Not on file  Social Connections: Not on file  Intimate Partner Violence: Not on file     Constitutional: Patient reports post episode fatigue.  Denies fever, malaise, headache or abrupt weight changes.  HEENT: Patient reports drooling from the left side of his mouth.  Denies eye pain, eye redness, ear pain, ringing in the ears, wax buildup, runny nose, nasal congestion, bloody nose, or sore throat. Respiratory: Denies difficulty breathing, shortness of breath, cough or sputum production.   Cardiovascular: Denies chest pain, chest tightness,  palpitations or swelling in the hands or feet.  Gastrointestinal: Denies abdominal pain, bloating, constipation, diarrhea or blood in the stool.  GU: Denies urgency, frequency, pain with urination, burning sensation, blood in urine, odor or discharge. Musculoskeletal: Patient reports facial stiffness and left arm pain.  Denies decrease in range of motion, difficulty with gait, or joint swelling.  Skin: Denies redness, rashes, lesions or ulcercations.  Neurological: Patient reports difficulty focusing, difficulty with thought.  Denies dizziness, difficulty with memory, difficulty with speech or problems with balance and coordination.  Psych: Patient reports stress.  Denies anxiety, depression, SI/HI.  No other specific complaints in a complete review of systems (except as listed in HPI above).  Objective:   Physical Exam  BP 133/86 (BP Location: Right Arm, Patient Position: Sitting, Cuff Size: Normal)   Pulse 68   Temp (!) 97.5 F (36.4 C) (Temporal)   Resp 17   Ht 5\' 8"  (1.727 m)   Wt 165 lb 9.6 oz (75.1 kg)   SpO2 100%   BMI 25.18 kg/m   Wt Readings from Last 3 Encounters:  08/30/20 162 lb (73.5 kg)  05/16/20 156 lb (70.8 kg)  04/24/20 160 lb (72.6 kg)    General: Appears his stated age, overweight, in NAD. Skin: Warm, dry and intact.  HEENT: Head: normal shape and size; Eyes: sclera white, no icterus, conjunctiva pink, PERRLA and EOMs intact;  Cardiovascular: Normal rate and rhythm. S1,S2 noted.  No murmur, rubs or gallops noted. No JVD or BLE edema. No carotid bruits noted. Pulmonary/Chest: Normal effort and positive vesicular breath sounds. No respiratory distress. No wheezes, rales or ronchi noted.  Musculoskeletal: No difficulty with gait.  Neurological: Alert and oriented. Cranial nerves II-XII grossly intact. Coordination normal.  Psychiatric: Mood and affect normal. Behavior is normal. Judgment and thought content normal.     BMET    Component Value Date/Time   NA  137 08/30/2020 1506   NA 138 05/04/2012 1815   K 4.2 08/30/2020 1506   K 3.5 05/04/2012 1815   CL 100 08/30/2020 1506   CL 99 05/04/2012 1815   CO2 32 08/30/2020 1506   CO2 29 05/04/2012 1815   GLUCOSE 130 (H) 08/30/2020 1506   GLUCOSE 69 05/04/2012 1815   BUN 14 08/30/2020 1506   BUN 12 05/04/2012 1815   CREATININE 0.92 08/30/2020 1506   CREATININE 1.06 05/04/2012 1815   CALCIUM 9.4 08/30/2020 1506   CALCIUM 9.2 05/04/2012 1815   GFRNONAA >60 05/16/2020 1416   GFRNONAA >60 05/04/2012 1815   GFRAA >60 03/03/2017 1314   GFRAA >60 05/04/2012 1815    Lipid Panel  Component Value Date/Time   CHOL 194 08/30/2020 1506   TRIG 303.0 (H) 08/30/2020 1506   HDL 28.40 (L) 08/30/2020 1506   CHOLHDL 7 08/30/2020 1506   VLDL 60.6 (H) 08/30/2020 1506   LDLCALC 113 (H) 08/19/2016 1322    CBC    Component Value Date/Time   WBC 8.6 05/16/2020 1416   RBC 5.82 (H) 05/16/2020 1416   HGB 16.9 05/16/2020 1416   HGB 16.4 05/04/2012 1815   HCT 47.9 05/16/2020 1416   HCT 48.4 05/04/2012 1815   PLT 284 05/16/2020 1416   PLT 261 05/04/2012 1815   MCV 82.3 05/16/2020 1416   MCV 86 05/04/2012 1815   MCH 29.0 05/16/2020 1416   MCHC 35.3 05/16/2020 1416   RDW 11.9 05/16/2020 1416   RDW 12.7 05/04/2012 1815   LYMPHSABS 2.4 01/18/2015 0713   MONOABS 0.5 01/18/2015 0713   EOSABS 0.2 01/18/2015 0713   BASOSABS 0.1 01/18/2015 0713    Hgb A1C Lab Results  Component Value Date   HGBA1C 8.9 (H) 08/30/2020            Assessment & Plan:  Seizure-Like Activity:  I was able to observe an episode that he was describing.  I was talking with him and he seemed to all of a sudden just spaced out 6, stare into nowhere.  He was unable to respond momentarily.  He did seem to have some difficulty swallowing but no drooling observed although he was wearing a mask.  He did seem to exhibit some left arm pain. MRI brain ordered Referral to neurology placed He may also need sleep study  Webb Silversmith, NP This visit occurred during the SARS-CoV-2 public health emergency.  Safety protocols were in place, including screening questions prior to the visit, additional usage of staff PPE, and extensive cleaning of exam room while observing appropriate contact time as indicated for disinfecting solutions.

## 2020-12-06 ENCOUNTER — Other Ambulatory Visit: Payer: Self-pay

## 2020-12-06 ENCOUNTER — Encounter: Payer: Self-pay | Admitting: Internal Medicine

## 2020-12-06 DIAGNOSIS — E663 Overweight: Secondary | ICD-10-CM | POA: Insufficient documentation

## 2020-12-06 LAB — HEMOGLOBIN A1C
Hgb A1c MFr Bld: 9.1 % of total Hgb — ABNORMAL HIGH (ref <5.7)
Mean Plasma Glucose: 214 mg/dL
eAG (mmol/L): 11.9 mmol/L

## 2020-12-06 LAB — COMPLETE METABOLIC PANEL WITH GFR
AG Ratio: 2.2 (calc) (ref 1.0–2.5)
ALT: 43 U/L (ref 9–46)
AST: 20 U/L (ref 10–40)
Albumin: 4.8 g/dL (ref 3.6–5.1)
Alkaline phosphatase (APISO): 108 U/L (ref 36–130)
BUN: 12 mg/dL (ref 7–25)
CO2: 29 mmol/L (ref 20–32)
Calcium: 9.5 mg/dL (ref 8.6–10.3)
Chloride: 99 mmol/L (ref 98–110)
Creat: 0.79 mg/dL (ref 0.60–1.35)
GFR, Est African American: 123 mL/min/{1.73_m2} (ref >=60)
GFR, Est Non African American: 106 mL/min/{1.73_m2} (ref >=60)
Globulin: 2.2 g/dL (calc) (ref 1.9–3.7)
Glucose, Bld: 208 mg/dL — ABNORMAL HIGH (ref 65–99)
Potassium: 4.3 mmol/L (ref 3.5–5.3)
Sodium: 136 mmol/L (ref 135–146)
Total Bilirubin: 0.5 mg/dL (ref 0.2–1.2)
Total Protein: 7 g/dL (ref 6.1–8.1)

## 2020-12-06 LAB — LIPID PANEL
Cholesterol: 165 mg/dL (ref <200)
HDL: 32 mg/dL — ABNORMAL LOW (ref >=40)
LDL Cholesterol (Calc): 100 mg/dL (calc) — ABNORMAL HIGH
Non-HDL Cholesterol (Calc): 133 mg/dL (calc) — ABNORMAL HIGH (ref <130)
Total CHOL/HDL Ratio: 5.2 (calc) — ABNORMAL HIGH (ref <5.0)
Triglycerides: 214 mg/dL — ABNORMAL HIGH (ref <150)

## 2020-12-06 LAB — TSH: TSH: 2.67 mIU/L (ref 0.40–4.50)

## 2020-12-06 MED ORDER — ATORVASTATIN CALCIUM 20 MG PO TABS: 20.0000 mg | ORAL_TABLET | Freq: Every day | ORAL | 3 refills | Status: DC

## 2020-12-06 NOTE — Assessment & Plan Note (Signed)
Encourage diet and exercise for weight loss 

## 2020-12-06 NOTE — Patient Instructions (Signed)
Seizure, Adult A seizure is a sudden burst of abnormal electrical and chemical activity in thebrain. Seizures usually last from 30 seconds to 2 minutes.  What are the causes? Common causes of this condition include: Fever or infection. Problems that affect the brain. These may include: A brain or head injury. Bleeding in the brain. A brain tumor. Low levels of blood sugar or salt. Kidney problems or liver problems. Conditions that are passed from parent to child (are inherited). Problems with a substance, such as: Having a reaction to a drug or a medicine. Stopping the use of a substance all of a sudden (withdrawal). A stroke. Disorders that affect how you develop. Sometimes, the cause may not be known.  What increases the risk? Having someone in your family who has epilepsy. In this condition, seizures happen again and again over time. They have no clear cause. Having had a tonic-clonic seizure before. This type of seizure causes you to: Tighten the muscles of the whole body. Lose consciousness. Having had a head injury or strokes before. Having had a lack of oxygen at birth. What are the signs or symptoms? There are many types of seizures. The symptoms vary depending on the type ofseizure you have. Symptoms during a seizure Shaking that you cannot control (convulsions) with fast, jerky movements of muscles. Stiffness of the body. Breathing problems. Feeling mixed up (confused). Staring or not responding to sound or touch. Head nodding. Eyes that blink, flutter, or move fast. Drooling, grunting, or making clicking sounds with your mouth Losing control of when you pee or poop. Symptoms before a seizure Feeling afraid, nervous, or worried. Feeling like you may vomit. Feeling like: You are moving when you are not. Things around you are moving when they are not. Feeling like you saw or heard something before (dj vu). Odd tastes or smells. Changes in how you see. You may  see flashing lights or spots. Symptoms after a seizure Feeling confused. Feeling sleepy. Headache. Sore muscles. How is this treated? If your seizure stops on its own, you will not need treatment. If your seizure lasts longer than 5 minutes, you will normally need treatment. Treatment may include: Medicines given through an IV tube. Avoiding things, such as medicines, that are known to cause your seizures. Medicines to prevent seizures. A device to prevent or control seizures. Surgery. A diet low in carbohydrates and high in fat (ketogenic diet). Follow these instructions at home: Medicines Take over-the-counter and prescription medicines only as told by your doctor. Avoid foods or drinks that may keep your medicine from working, such as alcohol. Activity Follow instructions about driving, swimming, or doing things that would be dangerous if you had another seizure. Wait until your doctor says it is safe for you to do these things. If you live in the [place] ask your local department of motor vehicles when you can drive. Get a lot of rest. Teaching others  Teach friends and family what to do when you have a seizure. They should: Help you get down to the ground. Protect your head and body. Loosen any clothing around your neck. Turn you on your side. Know whether or not you need emergency care. Stay with you until you are better. Also, tell them what not to do if you have a seizure. Tell them: They should not hold you down. They should not put anything in your mouth.  General instructions Avoid anything that gives you seizures. Keep a seizure diary. Write down: What you remember about  each seizure. What you think caused each seizure. Keep all follow-up visits. Contact a doctor if: You have another seizure or seizures. Call the doctor each time you have a seizure. The pattern of your seizures changes. You keep having seizures with treatment. You have symptoms of being sick or  having an infection. You are not able to take your medicine. Get help right away if: You have any of these problems: A seizure that lasts longer than 5 minutes. Many seizures in a row and you do not feel better between seizures. A seizure that makes it harder to breathe. A seizure and you can no longer speak or use part of your body. You do not wake up right after a seizure. You get hurt during a seizure. You feel confused or have pain right after a seizure. These symptoms may be an emergency. Get help right away. Call your local emergency services (911 in the [place] Do not wait to see if the symptoms will go away. Do not drive yourself to the hospital. Summary A seizure is a sudden burst of abnormal electrical and chemical activity in the brain. Seizures normally last from 30 seconds to 2 minutes. Causes of seizures include illness, injury to the head, low levels of blood sugar or salt, and certain conditions. Most seizures will stop on their own in less than 5 minutes. Seizures that last longer than 5 minutes are a medical emergency and need treatment right away. Many medicines are used to treat seizures. Take over-the-counter and prescription medicines only as told by your doctor. This information is not intended to replace advice given to you by your health care provider. Make sure you discuss any questions you have with your healthcare provider. Document Revised: 12/16/2019 Document Reviewed: 12/16/2019 Elsevier Patient Education  [employer].

## 2020-12-06 NOTE — Assessment & Plan Note (Signed)
C-Met and lipid profile today Encouraged him to consume a low-fat diet Continue Atorvastatin, will adjust if needed based on labs

## 2020-12-06 NOTE — Assessment & Plan Note (Signed)
A1c today We will check urine microalbumin at annual exam He needs to take Glipizide twice daily as prescribed instead of once daily Encouraged him to consume a low-carb diet and exercise weight loss Encourage routine foot exams Encouraged eye exam

## 2020-12-14 ENCOUNTER — Telehealth: Payer: Self-pay

## 2020-12-14 NOTE — Telephone Encounter (Signed)
Copied from [place] [id number]. Topic: General - Other >> Dec 14, 2020  3:00 PM Pawlus, Brayton Layman A wrote: Reason for CRM: Pts wife was following up on the refferal for the pt to get a MRI/CAT Scan, please call back and advise how the pt can get scheduled in.

## 2020-12-21 ENCOUNTER — Telehealth: Payer: Self-pay

## 2020-12-21 ENCOUNTER — Other Ambulatory Visit: Payer: Self-pay | Admitting: Internal Medicine

## 2020-12-21 ENCOUNTER — Other Ambulatory Visit: Payer: Self-pay

## 2020-12-21 ENCOUNTER — Telehealth: Payer: Self-pay | Admitting: Internal Medicine

## 2020-12-21 ENCOUNTER — Ambulatory Visit
Admission: RE | Admit: 2020-12-21 | Discharge: 2020-12-21 | Disposition: A | Discharge status: Home / Self Care | Payer: BC Managed Care – PPO | Source: Ambulatory Visit | Attending: Internal Medicine | Admitting: Internal Medicine

## 2020-12-21 ENCOUNTER — Encounter: Payer: Self-pay | Admitting: Internal Medicine

## 2020-12-21 DIAGNOSIS — E1165 Type 2 diabetes mellitus with hyperglycemia: Secondary | ICD-10-CM

## 2020-12-21 DIAGNOSIS — R569 Unspecified convulsions: Secondary | ICD-10-CM

## 2020-12-21 DIAGNOSIS — C719 Malignant neoplasm of brain, unspecified: Secondary | ICD-10-CM

## 2020-12-21 IMAGING — MR MR HEAD WO/W CM
13 of 15 series · 39 of 48 positions shown · IV contrast (gadavist)
Comparison: No pertinent prior exams available for comparison.
COMPARISON: No pertinent prior exams available for comparison.

Addendum:
CLINICAL DATA: Provided history: Seizure-like activity. Seizure,
nontraumatic. Additional history provided by scanning technologist:
Patient reports facial spasms with drooling [DATE], 3-4 times
per day, prior head trauma [VH].

EXAM:
MRI HEAD WITHOUT AND WITH CONTRAST
TECHNIQUE: Multiplanar, multiecho pulse sequences of the brain and surrounding
structures were obtained without and with intravenous contrast.
CONTRAST:  7mL GADAVIST GADOBUTROL 1 MMOL/ML IV SOLN

[Series 2: DWI · axial · 3.0mm · 1.20mm/px · z∈[-25,+135]mm · 6 of 110 slices shown (1 of 4)]
[im 1/110]
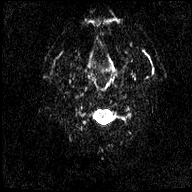
[im 22/110]
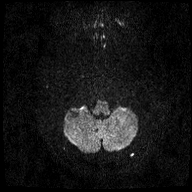
[im 44/110]
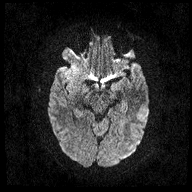
[im 66/110]
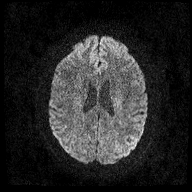
[im 88/110]
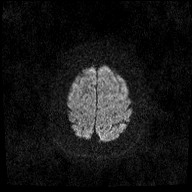
[im 110/110]
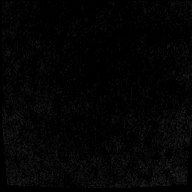

[Series 3: DWI · axial · 3.0mm · 1.20mm/px · z∈[-25,+135]mm · 3 of 55 slices shown (2 of 4)]
[im 1/55]
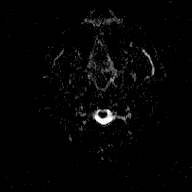
[im 28/55]
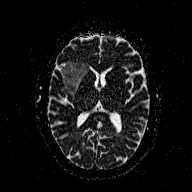
[im 55/55]
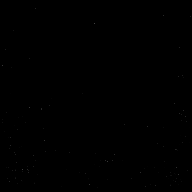

[Series 4: DWI · coronal · 3.0mm · 1.15mm/px · 5 of 98 slices shown (3 of 4)]
[im 1/98]
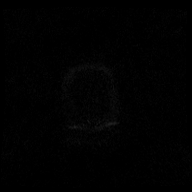
[im 25/98]
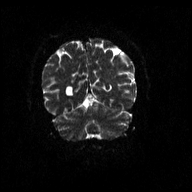
[im 49/98]
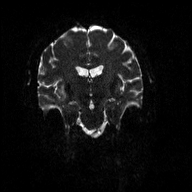
[im 73/98]
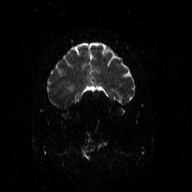
[im 98/98]
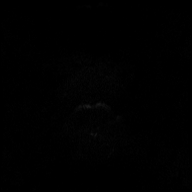

[Series 5: DWI · coronal · 3.0mm · 1.15mm/px · 2 of 49 slices shown (4 of 4)]
[im 1/49]
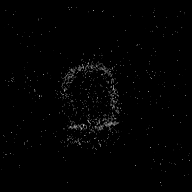
[im 49/49]
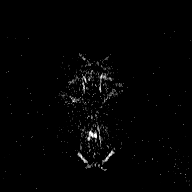

[Series 7: T2 · axial · 5.0mm · 0.72mm/px · 1 of 25 slices shown (1 of 3)]
[im 1/25]
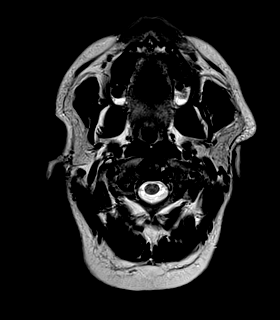

[Series 8: FLAIR · axial · 3.0mm · 0.45mm/px · z∈[-25,+135]mm · 3 of 55 slices shown (1 of 2)]
[im 1/55]
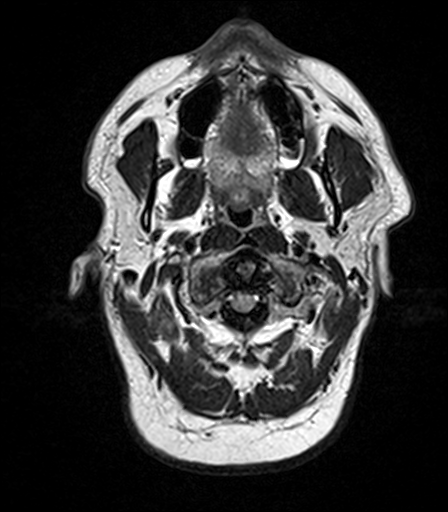
[im 28/55]
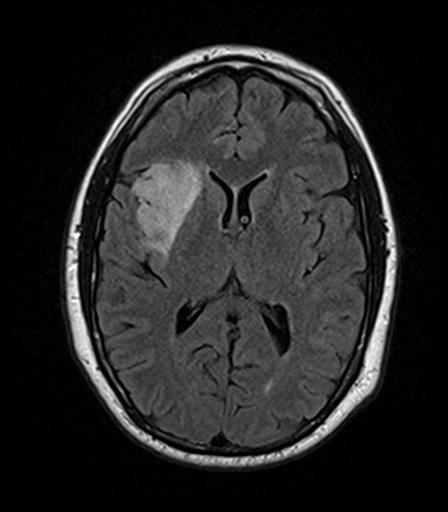
[im 55/55]
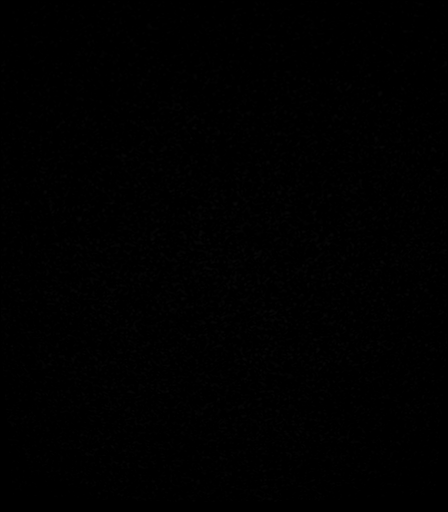

[Series 9: T2 · axial · 5.0mm · 0.72mm/px · 1 of 25 slices shown (2 of 3)]
[im 1/25]
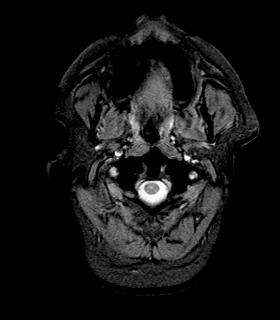

[Series 11: T2 · coronal · 3.0mm · 0.47mm/px · 2 of 35 slices shown (3 of 3)]
[im 1/35]
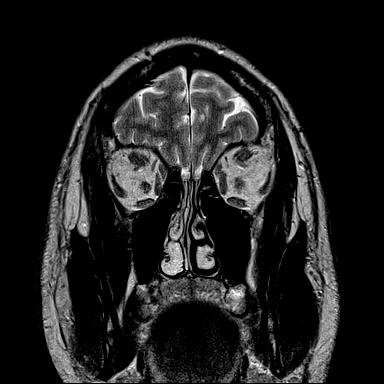
[im 35/35]
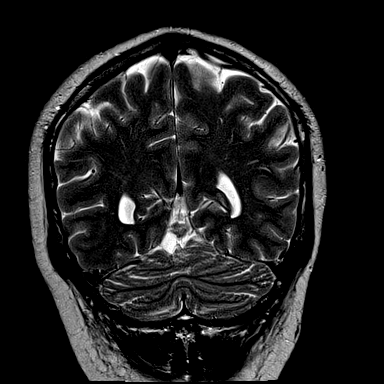

[Series 12: FLAIR · coronal · 3.0mm · 0.45mm/px · 3 of 63 slices shown (2 of 2)]
[im 1/63]
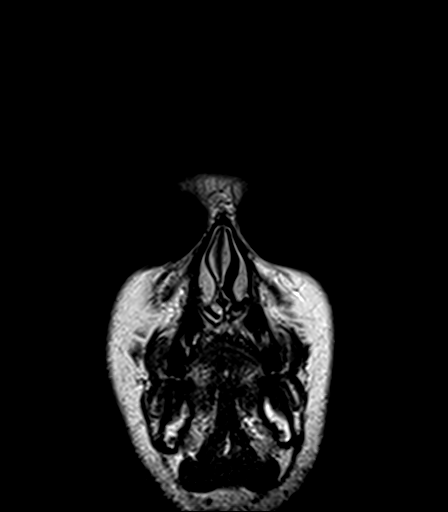
[im 32/63]
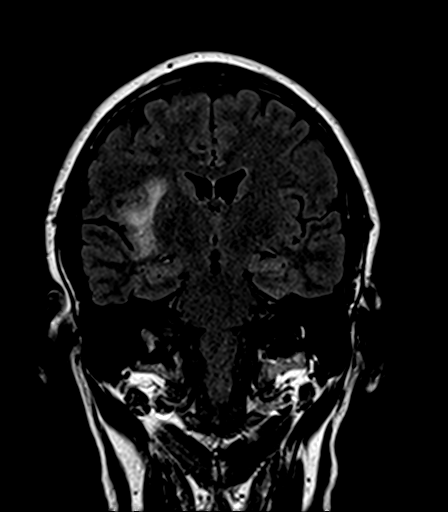
[im 63/63]
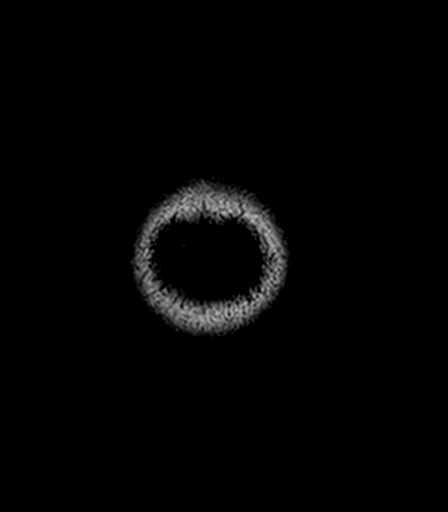

[Series 13: T2 post-contrast · coronal · 5.0mm · 0.43mm/px · 2 of 31 slices shown]
[im 1/31]
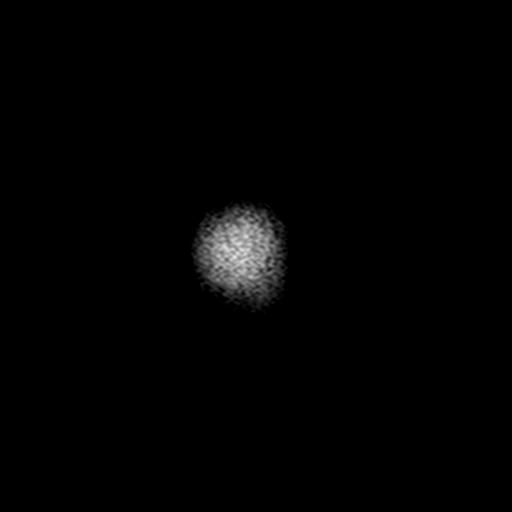
[im 31/31]
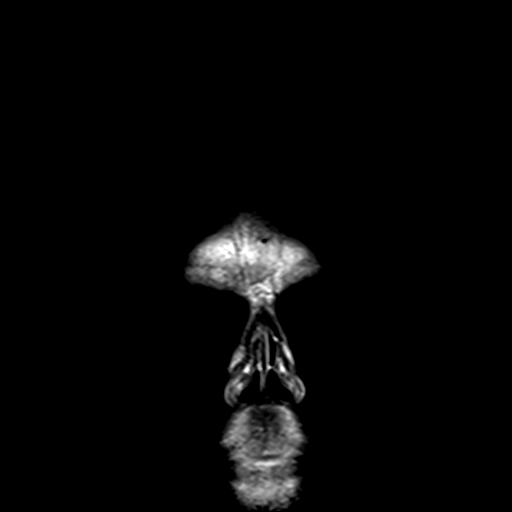

[Series 14: T1 post-contrast · axial · 1.0mm · 1.00mm/px · z∈[-22,+136]mm · 8 of 160 slices shown (1 of 3)]
[im 1/160]
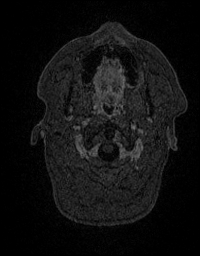
[im 23/160]
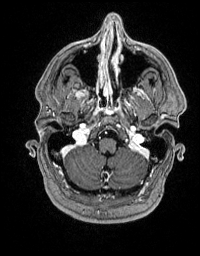
[im 46/160]
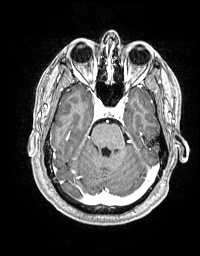
[im 69/160]
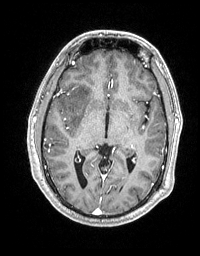
[im 91/160]
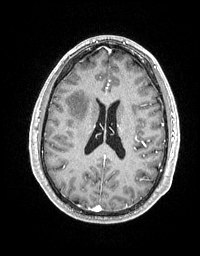
[im 114/160]
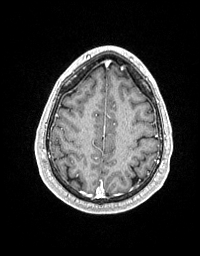
[im 137/160]
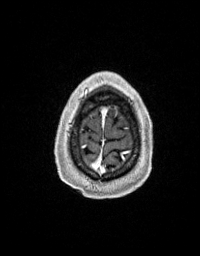
[im 160/160]
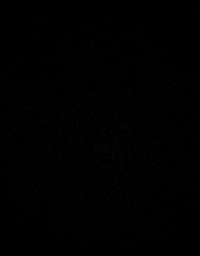

[Series 15: T1 post-contrast · coronal · 5.0mm · 0.43mm/px · 2 of 31 slices shown (2 of 3)]
[im 1/31]
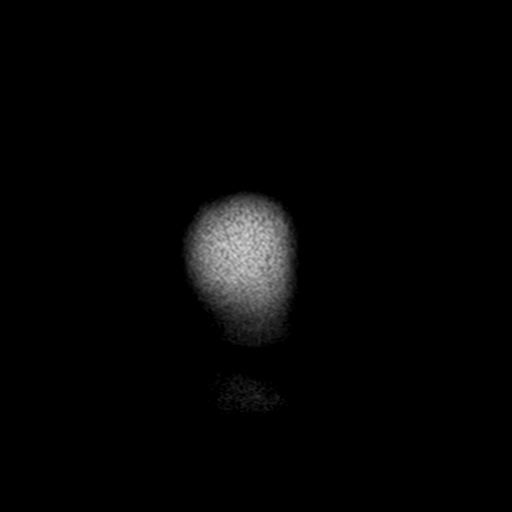
[im 31/31]
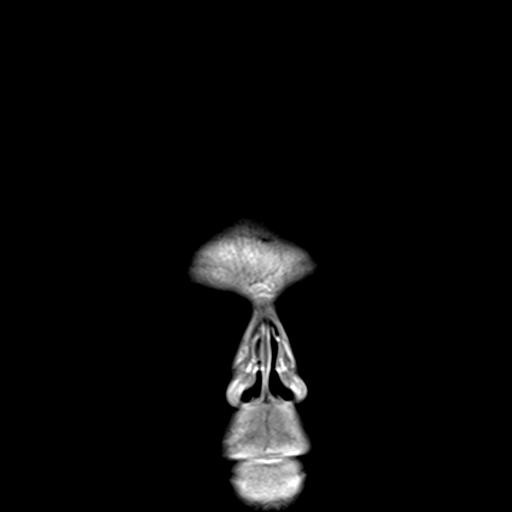

[Series 16: T1 post-contrast · sagittal · 5.0mm · 0.45mm/px · 1 of 23 slices shown (3 of 3)]
[im 1/23]
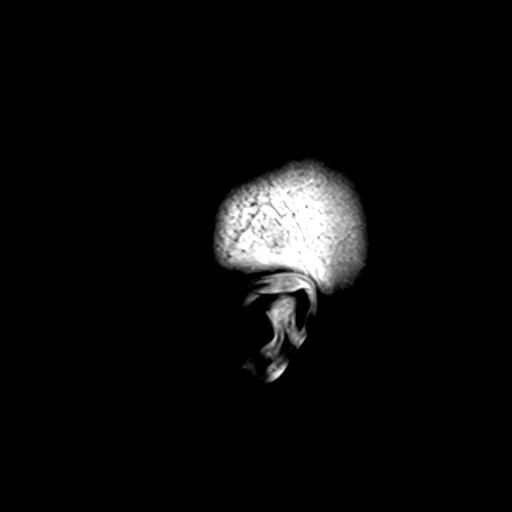

[39 of 48 positions shown; findings below may reference images not displayed]

FINDINGS: Brain:

Cerebral volume is normal for age.

5.1 x 3.5 x 4.5 cm expansile region of T2/FLAIR hyperintense signal
abnormality within the right frontal operculum and corona radiata,
right insula and subinsular white matter, as well as inferolateral
right frontal lobe and anterior right temporal lobe. There is
vascular enhancement at this site without definite abnormal
parenchymal enhancement. Local mass effect without significant
ventricular effacement or midline shift.

No cortical encephalomalacia is identified. No significant white
matter disease elsewhere.

There is no acute infarct.

No chronic intracranial blood products.

No extra-axial fluid collection.

Vascular: Expected proximal arterial flow voids.

Skull and upper cervical spine: No focal marrow lesion.

Sinuses/Orbits: Visualized orbits show no acute finding. Trace
bilateral ethmoid sinus mucosal thickening.
IMPRESSION: 5.1 x 3.5 x 4.5 cm expansile region of T2/FLAIR hyperintense signal
abnormality within the right frontal operculum and corona radiata,
right insula and subinsular white matter, as well as inferolateral
right frontal lobe and anterior right temporal lobe. There is
vascular enhancement at this site, without definite abnormal
parenchymal enhancement. The imaging features are most suggestive of
a glioma (favor low-grade given the lack of definite abnormal
parenchymal enhancement). Neuro-Oncology consultation is
recommended. Local mass effect without significant ventricular
effacement or midline shift.

ADDENDUM:
These results were called by telephone at the time of interpretation
on [DATE] at [DATE] to provider ARO , who verbally
acknowledged these results.

*** End of Addendum ***
FINDINGS: Brain:

Cerebral volume is normal for age.

5.1 x 3.5 x 4.5 cm expansile region of T2/FLAIR hyperintense signal
abnormality within the right frontal operculum and corona radiata,
right insula and subinsular white matter, as well as inferolateral
right frontal lobe and anterior right temporal lobe. There is
vascular enhancement at this site without definite abnormal
parenchymal enhancement. Local mass effect without significant
ventricular effacement or midline shift.

No cortical encephalomalacia is identified. No significant white
matter disease elsewhere.

There is no acute infarct.

No chronic intracranial blood products.

No extra-axial fluid collection.

Vascular: Expected proximal arterial flow voids.

Skull and upper cervical spine: No focal marrow lesion.

Sinuses/Orbits: Visualized orbits show no acute finding. Trace
bilateral ethmoid sinus mucosal thickening.
IMPRESSION: 5.1 x 3.5 x 4.5 cm expansile region of T2/FLAIR hyperintense signal
abnormality within the right frontal operculum and corona radiata,
right insula and subinsular white matter, as well as inferolateral
right frontal lobe and anterior right temporal lobe. There is
vascular enhancement at this site, without definite abnormal
parenchymal enhancement. The imaging features are most suggestive of
a glioma (favor low-grade given the lack of definite abnormal
parenchymal enhancement). Neuro-Oncology consultation is
recommended. Local mass effect without significant ventricular
effacement or midline shift.

## 2020-12-21 MED ORDER — SITAGLIPTIN PHOSPHATE 50 MG PO TABS: 50.0000 mg | ORAL_TABLET | Freq: Every day | ORAL | 0 refills | Status: DC

## 2020-12-21 MED ORDER — GADOBUTROL 1 MMOL/ML IV SOLN
7.0000 mL | Freq: Once | INTRAVENOUS | Status: AC | PRN
  Administered 2020-12-21: 7 mL via INTRAVENOUS

## 2020-12-21 NOTE — Telephone Encounter (Signed)
[place] Day - Client Nonclinical Telephone Record AccessNurse Client [place] Day - Client Client Site [facility] - Day Physician Webb Silversmith- NP Contact Type Call Who Is Calling Lab / Radiology Lab Name [facility] Radiology Lab Phone Number [phone] Tech [address] Armandina Gemma Lab Reference Number N/A Patient Name Alan Wise Patient DOB Nov 28, 1970 Call Type Lab Message Only Reason for Call Report lab results Initial Comment Caller states they would like to speak with a provider re MRI findings on a patient. Disp. Time Disposition Final User 12/21/2020 10:20:31 AM General Information Provided Yes Lacretia Nicks Call Closed By: Lacretia Nicks Transaction Date/Time: 12/21/2020 10:14:35 AM (ET)

## 2020-12-21 NOTE — Telephone Encounter (Signed)
Pt saw Avie Echevaria NP on 12/05/20. I spoke with Marjory Lies at [facility] to make aware sending this note from Dr Armandina Gemma with [facility] to Avie Echevaria NP; Dr Armandina Gemma wants to speak with Avie Echevaria NP about MRI findings for this pt. Marjory Lies will make Avie Echevaria NP aware of this note to Avie Echevaria NP

## 2020-12-21 NOTE — Telephone Encounter (Signed)
Received a new p referral from from Webb Silversmith, NP for eval of glioma. Alan Wise has been cld and scheduled to see Dr. Mickeal Skinner on 7/11 at [place] to arrive 15 minutes early.

## 2020-12-21 NOTE — Telephone Encounter (Signed)
This has been addressed.

## 2020-12-31 ENCOUNTER — Inpatient Hospital Stay: Payer: BC Managed Care – PPO | Attending: Internal Medicine | Admitting: Internal Medicine

## 2020-12-31 ENCOUNTER — Other Ambulatory Visit: Payer: Self-pay

## 2020-12-31 ENCOUNTER — Encounter: Payer: Self-pay | Admitting: Internal Medicine

## 2020-12-31 DIAGNOSIS — Z7984 Long term (current) use of oral hypoglycemic drugs: Secondary | ICD-10-CM | POA: Insufficient documentation

## 2020-12-31 DIAGNOSIS — E119 Type 2 diabetes mellitus without complications: Secondary | ICD-10-CM | POA: Diagnosis not present

## 2020-12-31 DIAGNOSIS — Z87442 Personal history of urinary calculi: Secondary | ICD-10-CM | POA: Diagnosis not present

## 2020-12-31 DIAGNOSIS — R569 Unspecified convulsions: Secondary | ICD-10-CM | POA: Insufficient documentation

## 2020-12-31 DIAGNOSIS — Z823 Family history of stroke: Secondary | ICD-10-CM

## 2020-12-31 DIAGNOSIS — Z8349 Family history of other endocrine, nutritional and metabolic diseases: Secondary | ICD-10-CM

## 2020-12-31 DIAGNOSIS — E785 Hyperlipidemia, unspecified: Secondary | ICD-10-CM | POA: Insufficient documentation

## 2020-12-31 DIAGNOSIS — Z79899 Other long term (current) drug therapy: Secondary | ICD-10-CM | POA: Diagnosis not present

## 2020-12-31 DIAGNOSIS — Z8249 Family history of ischemic heart disease and other diseases of the circulatory system: Secondary | ICD-10-CM

## 2020-12-31 DIAGNOSIS — G939 Disorder of brain, unspecified: Secondary | ICD-10-CM | POA: Insufficient documentation

## 2020-12-31 DIAGNOSIS — G9389 Other specified disorders of brain: Secondary | ICD-10-CM

## 2020-12-31 DIAGNOSIS — Z833 Family history of diabetes mellitus: Secondary | ICD-10-CM

## 2020-12-31 MED ORDER — LEVETIRACETAM 500 MG PO TABS: 500.0000 mg | ORAL_TABLET | Freq: Two times a day (BID) | ORAL | 3 refills | Status: DC

## 2020-12-31 NOTE — Progress Notes (Signed)
[facility], [place] [address] [phone]   New Patient Evaluation  Date of Service: 12/31/20 Patient Name: Alan Wise Patient MRN: [id number] Patient DOB: 25-Nov-1970 Provider: Ventura Sellers, MD  Identifying Statement:  Alan Wise is a 50 y.o. male with right temporal  mass  who presents for initial consultation and evaluation.    Referring Provider: Jearld Fenton, NP [address] [place],  [place] [address]  Oncologic History: 12/21/20: Presents with seizures, MRI demonstrates non enhancing right insular mass  Biomarkers:  MGMT Unknown.  IDH 1/2 Unknown.  EGFR Unknown  TERT Unknown   History of Present Illness: The patient's records from the referring physician were obtained and reviewed and the patient interviewed to confirm this HPI.  Alan Wise presented to medical attention this past month, with complaint of involuntary left sided shaking.  He describes episodes, occurring several times per week, of left arm shaking, lasting up to 5 minutes; following the episodes he describes the arm as "heavy" for some time before returning to normal.  He also describes episodes of sudden inability to speak, with strange movements of face and hands, lasting same amount of time.  These episodes go back ~10 years in very sporadic nature, but have become much more frequent in recent months.  He works full time as a Health and safety inspector at [employer].  These episodes have occurred at work, and have been disruptive in that environment.  He is otherwise fully functional, independent.  Medications: Current Outpatient Medications on File Prior to Visit  Medication Sig Dispense Refill   atorvastatin (LIPITOR) 20 MG tablet Take 1 tablet (20 mg total) by mouth daily. 90 tablet 3   FREESTYLE LITE test strip USE TWO TIMES DAILY TO TEST BLOOD SUGAR (Patient not taking: Reported on 12/05/2020) 50 each 11   glipiZIDE  (GLUCOTROL) 10 MG tablet TAKE 1 TABLET (10 MG TOTAL) BY MOUTH 2 (TWO) TIMES DAILY BEFORE A MEAL. (Patient taking differently: Take 10 mg by mouth daily before breakfast.) 180 tablet 0   pantoprazole (PROTONIX) 40 MG tablet Take 1 tablet (40 mg total) by mouth daily. 30 tablet 11   sitaGLIPtin (JANUVIA) 50 MG tablet Take 1 tablet (50 mg total) by mouth daily. 90 tablet 0   No current facility-administered medications on file prior to visit.    Allergies: No Known Allergies Past Medical History:  Past Medical History:  Diagnosis Date   Allergy    Diabetes mellitus ([place])    History of kidney stones    Hyperlipidemia    Renal disorder    kidney stones   Past Surgical History:  Past Surgical History:  Procedure Laterality Date   CYSTOSCOPY WITH STENT PLACEMENT Right 01/25/2016   Procedure: CYSTOSCOPY WITH STENT PLACEMENT;  Surgeon: Nickie Retort, MD;  Location: [facility] ORS;  Service: Urology;  Laterality: Right;   KNEE ARTHROSCOPY W/ MENISCAL REPAIR Right 1990   SKIN CANCER EXCISION  2014   URETEROSCOPY WITH HOLMIUM LASER LITHOTRIPSY Right 01/25/2016   Procedure: URETEROSCOPY WITH HOLMIUM LASER LITHOTRIPSY;  Surgeon: Nickie Retort, MD;  Location: [facility] ORS;  Service: Urology;  Laterality: Right;   URETHRAL STRICTURE DILATATION     WISDOM TOOTH EXTRACTION     Social History:  Social History   Socioeconomic History   Marital status: Married    Spouse name: Not on file   Number of children: Not on file   Years of  education: Not on file   Highest education level: Not on file  Occupational History   Not on file  Tobacco Use   Smoking status: Never   Smokeless tobacco: Never  Substance and Sexual Activity   Alcohol use: Yes    Alcohol/week: 0.0 standard drinks    Comment: rare   Drug use: No   Sexual activity: Not on file  Other Topics Concern   Not on file  Social History Narrative   Not on file   Social Determinants of Health   Financial Resource Strain: Not on file   Food Insecurity: Not on file  Transportation Needs: Not on file  Physical Activity: Not on file  Stress: Not on file  Social Connections: Not on file  Intimate Partner Violence: Not on file   Family History:  Family History  Problem Relation Age of Onset   Hyperlipidemia Father    Hypertension Father    Diabetes Father    Benign prostatic hyperplasia Father    Stroke Maternal Grandmother    Diabetes Maternal Grandmother    Stroke Paternal Grandmother    Hypertension Paternal Grandmother    Kidney disease Neg Hx    Prostate cancer Neg Hx     Review of Systems: Constitutional: Doesn't report fevers, chills or abnormal weight loss Eyes: Doesn't report blurriness of vision Ears, nose, mouth, throat, and face: Doesn't report sore throat Respiratory: Doesn't report cough, dyspnea or wheezes Cardiovascular: Doesn't report palpitation, chest discomfort  Gastrointestinal:  Doesn't report nausea, constipation, diarrhea GU: Doesn't report incontinence Skin: Doesn't report skin rashes Neurological: Per HPI Musculoskeletal: Doesn't report joint pain Behavioral/Psych: Doesn't report anxiety  Physical Exam: Vitals:   12/31/20 0856  BP: 135/83  Pulse: 70  Resp: 18  Temp: 98.3 F (36.8 C)  SpO2: 99%   KPS: 90. General: Alert, cooperative, pleasant, in no acute distress Head: Normal EENT: No conjunctival injection or scleral icterus.  Lungs: Resp effort normal Cardiac: Regular rate Abdomen: Non-distended abdomen Skin: No rashes cyanosis or petechiae. Extremities: No clubbing or edema  Neurologic Exam: Mental Status: Awake, alert, attentive to examiner. Oriented to self and environment. Language is fluent with intact comprehension.  Cranial Nerves: Visual acuity is grossly normal. Visual fields are full. Extra-ocular movements intact. No ptosis. Face is symmetric Motor: Tone and bulk are normal. Power is full in both arms and legs. Reflexes are symmetric, no pathologic  reflexes present.  Sensory: Intact to light touch Gait: Normal.   Labs: I have reviewed the data as listed    Component Value Date/Time   NA 136 12/05/2020 1014   NA 138 05/04/2012 1815   K 4.3 12/05/2020 1014   K 3.5 05/04/2012 1815   CL 99 12/05/2020 1014   CL 99 05/04/2012 1815   CO2 29 12/05/2020 1014   CO2 29 05/04/2012 1815   GLUCOSE 208 (H) 12/05/2020 1014   GLUCOSE 69 05/04/2012 1815   BUN 12 12/05/2020 1014   BUN 12 05/04/2012 1815   CREATININE 0.79 12/05/2020 1014   CALCIUM 9.5 12/05/2020 1014   CALCIUM 9.2 05/04/2012 1815   PROT 7.0 12/05/2020 1014   PROT 8.5 (H) 05/04/2012 1815   ALBUMIN 4.5 08/30/2020 1506   ALBUMIN 4.5 05/04/2012 1815   AST 20 12/05/2020 1014   AST 39 (H) 05/04/2012 1815   ALT 43 12/05/2020 1014   ALT 77 05/04/2012 1815   ALKPHOS 108 08/30/2020 1506   ALKPHOS 129 05/04/2012 1815   BILITOT 0.5 12/05/2020 1014   BILITOT  0.4 05/04/2012 1815   GFRNONAA 106 12/05/2020 1014   GFRAA 123 12/05/2020 1014   Lab Results  Component Value Date   WBC 8.6 05/16/2020   NEUTROABS 4.6 01/18/2015   HGB 16.9 05/16/2020   HCT 47.9 05/16/2020   MCV 82.3 05/16/2020   PLT 284 05/16/2020    Imaging:  MR Brain W Wo Contrast  Addendum Date: 12/21/2020   ADDENDUM REPORT: 12/21/2020 11:23 ADDENDUM: These results were called by telephone at the time of interpretation on 12/21/2020 at 11:23 am to provider Webb Silversmith , who verbally acknowledged these results. Electronically Signed   By: Kellie Simmering DO   On: 12/21/2020 11:23   Result Date: 12/21/2020 CLINICAL DATA:  Provided history: Seizure-like activity. Seizure, nontraumatic. Additional history provided by scanning technologist: Patient reports facial spasms with drooling January 2022, 3-4 times per day, prior head trauma 2012. EXAM: MRI HEAD WITHOUT AND WITH CONTRAST TECHNIQUE: Multiplanar, multiecho pulse sequences of the brain and surrounding structures were obtained without and with intravenous contrast.  CONTRAST:  85m GADAVIST GADOBUTROL 1 MMOL/ML IV SOLN COMPARISON:  No pertinent prior exams available for comparison. FINDINGS: Brain: Cerebral volume is normal for age. 5.1 x 3.5 x 4.5 cm expansile region of T2/FLAIR hyperintense signal abnormality within the right frontal operculum and corona radiata, right insula and subinsular white matter, as well as inferolateral right frontal lobe and anterior right temporal lobe. There is vascular enhancement at this site without definite abnormal parenchymal enhancement. Local mass effect without significant ventricular effacement or midline shift. No cortical encephalomalacia is identified. No significant white matter disease elsewhere. There is no acute infarct. No chronic intracranial blood products. No extra-axial fluid collection. Vascular: Expected proximal arterial flow voids. Skull and upper cervical spine: No focal marrow lesion. Sinuses/Orbits: Visualized orbits show no acute finding. Trace bilateral ethmoid sinus mucosal thickening. IMPRESSION: 5.1 x 3.5 x 4.5 cm expansile region of T2/FLAIR hyperintense signal abnormality within the right frontal operculum and corona radiata, right insula and subinsular white matter, as well as inferolateral right frontal lobe and anterior right temporal lobe. There is vascular enhancement at this site, without definite abnormal parenchymal enhancement. The imaging features are most suggestive of a glioma (favor low-grade given the lack of definite abnormal parenchymal enhancement). Neuro-Oncology consultation is recommended. Local mass effect without significant ventricular effacement or midline shift. 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  He is left handed, and experiences speech arrest during some seizure episodes.  This suggests right hemispheric dominance, which we suspect solidify his candidacy for stereotactic biopsy rather than craniotomy, resection.  Case has not yet been discussed with neurosurgery.  We recommended neurosurgical referral, which he agreed to.  Because of clear progression of symptoms, size and location of tumor, we would not recommend deferring diagnosis and treatment planning.    For seizures, recommended Keppra 5027mBID to start, and emphasized need for seizure calendar to document frequency and character of episodes.  He did experience a complex partial seizure at bedside today, and I he is having more frequent events than characterized in history.  Counseled on epilepsy safety, driving restrictions for 6 months until seizure  free.  We will follow closely and re-engage once surgical plan is set.  We spent twenty additional minutes teaching regarding the natural history, biology, and historical experience in the treatment of brain tumors. We then discussed in detail the current recommendations for therapy focusing on the mode of administration, mechanism of action, anticipated toxicities, and quality of life issues associated with this plan. We also provided teaching sheets for the patient to take home as an additional resource.  All questions were answered. The patient knows to call the clinic with any problems, questions or concerns. No barriers to learning were detected.  The total time spent in the encounter was 60 minutes and more than 50% was on counseling and review of test results   Ventura Sellers, MD Medical Director of Neuro-Oncology [facility] [place] 12/31/20 8:59 AM

## 2021-01-04 ENCOUNTER — Encounter: Payer: Self-pay | Admitting: Internal Medicine

## 2021-01-07 ENCOUNTER — Other Ambulatory Visit (HOSPITAL_COMMUNITY): Payer: Self-pay | Admitting: Internal Medicine

## 2021-01-07 ENCOUNTER — Other Ambulatory Visit (HOSPITAL_COMMUNITY): Payer: Self-pay | Admitting: Neurosurgery

## 2021-01-07 DIAGNOSIS — C729 Malignant neoplasm of central nervous system, unspecified: Secondary | ICD-10-CM

## 2021-01-09 ENCOUNTER — Other Ambulatory Visit: Payer: Self-pay | Admitting: Neurosurgery

## 2021-01-14 ENCOUNTER — Other Ambulatory Visit: Payer: Self-pay | Admitting: Radiation Therapy

## 2021-01-14 NOTE — Pre-Procedure Instructions (Signed)
Surgical Instructions    Your procedure is scheduled on Thursday, July 28th, 2022.  Report to [facility] Main Entrance "A" at 08:45 A.M., then check in with the Admitting office.  Call this number if you have problems the morning of surgery:  [phone]   If you have any questions prior to your surgery date call [phone]: Open Monday-Friday 8am-4pm    Remember:  Do not eat or drink after midnight the night before your surgery     Take these medicines the morning of surgery with A SIP OF WATER  atorvastatin (LIPITOR) levETIRAcetam (KEPPRA)    As of today, STOP taking any Aspirin (unless otherwise instructed by your surgeon) Aleve, Naproxen, Ibuprofen, Motrin, Advil, Goody's, BC's, all herbal medications, fish oil, and all vitamins.  WHAT DO I DO ABOUT MY DIABETES MEDICATION?   Do not take sitaGLIPtin (JANUVIA) the morning of surgery. Only take morning and/or lunch doses of glipiZIDE (GLUCOTROL) on Wednesday, 01/16/21; DO NOT TAKE EVENING DOSE. Do NOT take glipiZIDE (GLUCOTROL) the morning of surgery.   HOW TO MANAGE YOUR DIABETES BEFORE AND AFTER SURGERY  Why is it important to control my blood sugar before and after surgery? Improving blood sugar levels before and after surgery helps healing and can limit problems. A way of improving blood sugar control is eating a healthy diet by:  Eating less sugar and carbohydrates  Increasing activity/exercise  Talking with your doctor about reaching your blood sugar goals High blood sugars (greater than 180 mg/dL) can raise your risk of infections and slow your recovery, so you will need to focus on controlling your diabetes during the weeks before surgery. Make sure that the doctor who takes care of your diabetes knows about your planned surgery including the date and location.  How do I manage my blood sugar before surgery? Check your blood sugar at least 4 times a day, starting 2 days before surgery, to make sure that the level  is not too high or low.  Check your blood sugar the morning of your surgery when you wake up and every 2 hours until you get to the Short Stay unit.  If your blood sugar is less than 70 mg/dL, you will need to treat for low blood sugar: Treat a low blood sugar (less than 70 mg/dL) with  cup of clear juice (cranberry or apple), 4 glucose tablets, OR glucose gel. Recheck blood sugar in 15 minutes after treatment (to make sure it is greater than 70 mg/dL). If your blood sugar is not greater than 70 mg/dL on recheck, call [phone] for further instructions. Report your blood sugar to the short stay nurse when you get to Short Stay.  If you are admitted to the hospital after surgery: Your blood sugar will be checked by the staff and you will probably be given insulin after surgery (instead of oral diabetes medicines) to make sure you have good blood sugar levels. The goal for blood sugar control after surgery is 80-180 mg/dL.           Do not wear jewelry Do not wear lotions, powders, colognes, or deodorant. Men may shave face and neck. Do not bring valuables to the hospital.             [facility] is not responsible for any belongings or valuables.  Do NOT Smoke (Tobacco/Vaping) or drink Alcohol 24 hours prior to your procedure If you use a CPAP at night, you may bring all equipment for your overnight stay.  Contacts, glasses, dentures or bridgework may not be worn into surgery, please bring cases for these belongings   For patients admitted to the hospital, discharge time will be determined by your treatment team.   Patients discharged the day of surgery will not be allowed to drive home, and someone needs to stay with them for 24 hours.  ONLY 1 SUPPORT PERSON MAY BE PRESENT WHILE YOU ARE IN SURGERY. IF YOU ARE TO BE ADMITTED ONCE YOU ARE IN YOUR ROOM YOU WILL BE ALLOWED TWO (2) VISITORS.  Minor children may have two parents present. Special consideration for safety and communication  needs will be reviewed on a case by case basis.  Special instructions:    Oral Hygiene is also important to reduce your risk of infection.  Remember - BRUSH YOUR TEETH THE MORNING OF SURGERY WITH YOUR REGULAR TOOTHPASTE   [facility]- Preparing For Surgery  Before surgery, you can play an important role. Because skin is not sterile, your skin needs to be as free of germs as possible. You can reduce the number of germs on your skin by washing with CHG (chlorahexidine gluconate) Soap before surgery.  CHG is an antiseptic cleaner which kills germs and bonds with the skin to continue killing germs even after washing.     Please do not use if you have an allergy to CHG or antibacterial soaps. If your skin becomes reddened/irritated stop using the CHG.  Do not shave (including legs and underarms) for at least 48 hours prior to first CHG shower. It is OK to shave your face.  Please follow these instructions carefully.     Shower the NIGHT BEFORE SURGERY and the MORNING OF SURGERY with CHG Soap.   If you chose to wash your hair, wash your hair first as usual with your normal shampoo. After you shampoo, rinse your hair and body thoroughly to remove the shampoo.  Then [employer] and genitals (private parts) with your normal soap and rinse thoroughly to remove soap.  After that Use CHG Soap as you would any other liquid soap. You can apply CHG directly to the skin and wash gently with a scrungie or a clean washcloth.   Apply the CHG Soap to your body ONLY FROM THE NECK DOWN.  Do not use on open wounds or open sores. Avoid contact with your eyes, ears, mouth and genitals (private parts). Wash Face and genitals (private parts)  with your normal soap.   Wash thoroughly, paying special attention to the area where your surgery will be performed.  Thoroughly rinse your body with warm water from the neck down.  DO NOT shower/wash with your normal soap after using and rinsing off the CHG Soap.  Pat  yourself dry with a CLEAN TOWEL.  Wear CLEAN PAJAMAS to bed the night before surgery  Place CLEAN SHEETS on your bed the night before your surgery  DO NOT SLEEP WITH PETS.   Day of Surgery:  Take a shower with CHG soap. Wear Clean/Comfortable clothing the morning of surgery Do not apply any deodorants/lotions.   Remember to brush your teeth WITH YOUR REGULAR TOOTHPASTE.   Please read over the following fact sheets that you were given.

## 2021-01-15 ENCOUNTER — Other Ambulatory Visit: Payer: Self-pay | Admitting: Neurosurgery

## 2021-01-15 ENCOUNTER — Ambulatory Visit (HOSPITAL_COMMUNITY)
Admission: RE | Admit: 2021-01-15 | Discharge: 2021-01-15 | Disposition: A | Discharge status: Home / Self Care | Payer: BC Managed Care – PPO | Source: Ambulatory Visit | Attending: Neurosurgery | Admitting: Neurosurgery

## 2021-01-15 ENCOUNTER — Other Ambulatory Visit: Payer: Self-pay

## 2021-01-15 ENCOUNTER — Encounter (HOSPITAL_COMMUNITY)
Admission: RE | Admit: 2021-01-15 | Discharge: 2021-01-15 | Disposition: A | Discharge status: Home / Self Care | Payer: BC Managed Care – PPO | Source: Ambulatory Visit | Attending: Neurosurgery | Admitting: Neurosurgery

## 2021-01-15 ENCOUNTER — Encounter (HOSPITAL_COMMUNITY): Payer: Self-pay

## 2021-01-15 DIAGNOSIS — C729 Malignant neoplasm of central nervous system, unspecified: Secondary | ICD-10-CM | POA: Insufficient documentation

## 2021-01-15 DIAGNOSIS — Z20822 Contact with and (suspected) exposure to covid-19: Secondary | ICD-10-CM | POA: Insufficient documentation

## 2021-01-15 HISTORY — DX: Headache, unspecified: R51.9

## 2021-01-15 HISTORY — DX: Other complications of anesthesia, initial encounter: T88.59XA

## 2021-01-15 HISTORY — DX: Gastro-esophageal reflux disease without esophagitis: K21.9

## 2021-01-15 LAB — BASIC METABOLIC PANEL
Anion gap: 6 (ref 5–15)
BUN: 7 mg/dL (ref 6–20)
CO2: 30 mmol/L (ref 22–32)
Calcium: 9.5 mg/dL (ref 8.9–10.3)
Chloride: 101 mmol/L (ref 98–111)
Creatinine, Ser: 0.93 mg/dL (ref 0.61–1.24)
GFR, Estimated: >60 mL/min (ref >60)
Glucose, Bld: 137 mg/dL — ABNORMAL HIGH (ref 70–99)
Potassium: 4.3 mmol/L (ref 3.5–5.1)
Sodium: 137 mmol/L (ref 135–145)

## 2021-01-15 LAB — TYPE AND SCREEN
ABO/RH(D): O POS
Antibody Screen: NEGATIVE

## 2021-01-15 LAB — CBC
HCT: 45 % (ref 39.0–52.0)
Hemoglobin: 15.9 g/dL (ref 13.0–17.0)
MCH: 29.3 pg (ref 26.0–34.0)
MCHC: 35.3 g/dL (ref 30.0–36.0)
MCV: 82.9 fL (ref 80.0–100.0)
Platelets: 234 10*3/uL (ref 150–400)
RBC: 5.43 MIL/uL (ref 4.22–5.81)
RDW: 11.8 % (ref 11.5–15.5)
WBC: 8.1 10*3/uL (ref 4.0–10.5)
nRBC: 0 % (ref 0.0–0.2)

## 2021-01-15 LAB — SARS CORONAVIRUS 2 (TAT 6-24 HRS): SARS Coronavirus 2: NEGATIVE

## 2021-01-15 LAB — GLUCOSE, CAPILLARY: Glucose-Capillary: 181 mg/dL — ABNORMAL HIGH (ref 70–99)

## 2021-01-15 IMAGING — CT CT ANGIO HEAD
1 of 11 series · 4 of 33 positions shown · IV contrast (omnipaque)
Comparison: Brain MRI [DATE]

CLINICAL DATA: Glioma of central nervous system.  Biopsy planning.

EXAM:
CT ANGIOGRAPHY HEAD
TECHNIQUE: Multidetector CT imaging of the head was performed using the
standard protocol during bolus administration of intravenous
contrast. Multiplanar CT image reconstructions and MIPs were
obtained to evaluate the vascular anatomy.
CONTRAST:  75mL OMNIPAQUE IOHEXOL 350 MG/ML SOLN

[Series 11: ax thin · axial · 0.32mm/px · z∈[+1549,+1654]mm · 4 of 175 slices shown]
[im 35/175  soft-tissue]
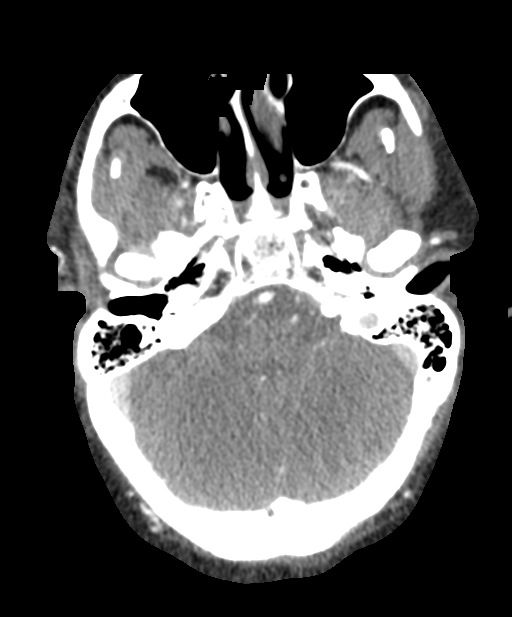
[im 70/175  bone]
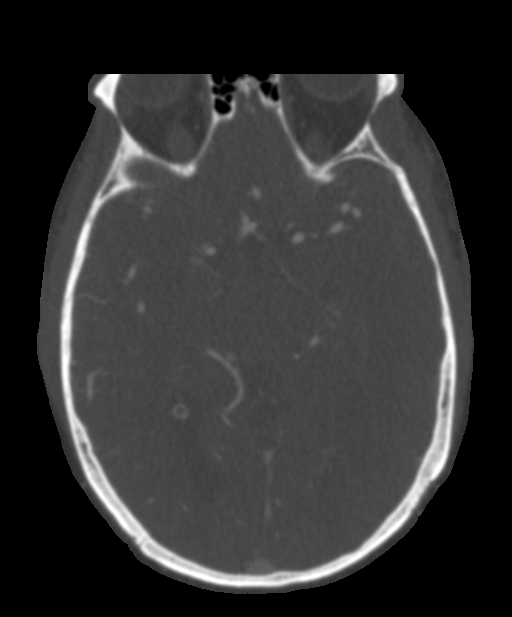
[im 105/175  soft-tissue]
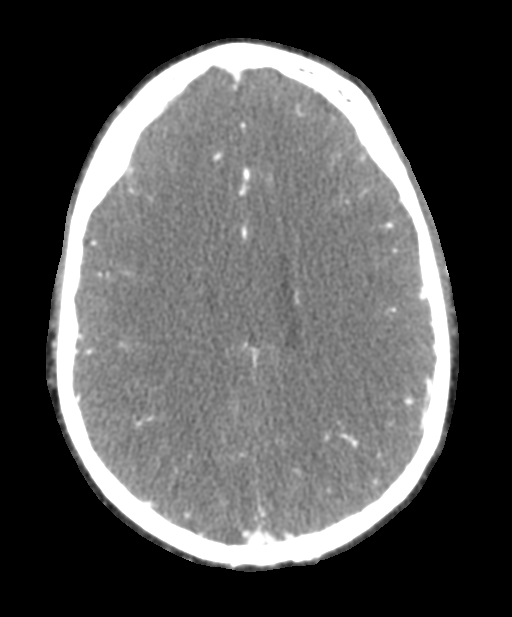
[im 140/175  bone]
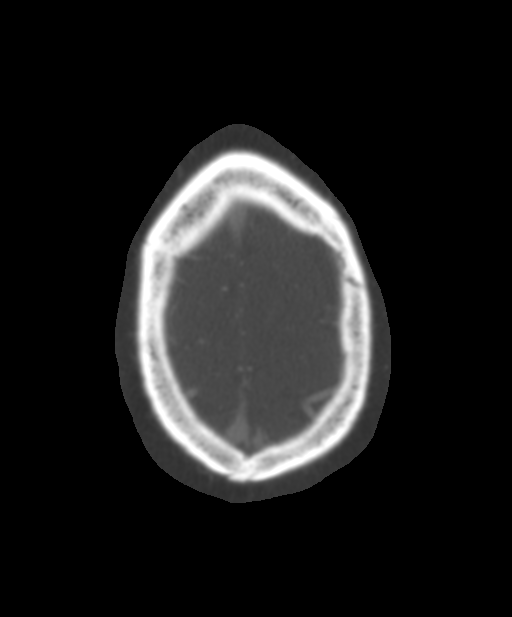

[4 of 33 positions shown; findings below may reference images not displayed]

FINDINGS: CT HEAD

Brain: Known infiltrating low-density in the right insula and
adjacent frontal lobe by MRI. No calcification or hemorrhage seen by
CT. No hydrocephalus or generalized mass effect.

Vascular: See below

Skull: No acute finding

Sinuses: Negative

CTA HEAD

Anterior circulation: Mild calcification at the carotid siphons. No
flow limiting stenosis, aneurysm, or signs of vascular malformation.
Right M2 branches are roughly divided by the infiltrating mass.

Posterior circulation: The vertebral and basilar arteries are smooth
and diffusely patent. No branch occlusion, beading, or aneurysm.

Venous sinuses: Diffusely patent. No large cortical vein at the
level of the mass.

Anatomic variants: None significant

Review of the MIP images confirms the above findings.
IMPRESSION: Treatment planning CTA showing no unexpected finding.

## 2021-01-15 MED ORDER — IOHEXOL 350 MG/ML SOLN
75.0000 mL | Freq: Once | INTRAVENOUS | Status: AC | PRN
  Administered 2021-01-15: 75 mL via INTRAVENOUS

## 2021-01-15 MED ORDER — SODIUM CHLORIDE (PF) 0.9 % IJ SOLN
INTRAMUSCULAR | Status: AC
  Filled 2021-01-15: qty 50

## 2021-01-15 NOTE — Progress Notes (Signed)
PCP - Webb Silversmith NP Cardiologist - pt denies  PPM/ICD - n/a  Chest x-ray - 05/16/20 EKG - 05/16/20 Stress Test - pt denies ECHO - pt reports that he had a "normal ECHO over 20 years ago when I was having dizziness." Unable to locate in chart. Pt reports he didn't require any f/u. Cardiac Cath - pt denies  Sleep Study - pt denies  Fasting Blood Sugar - pt reports that "I have not been checking it regularly for several years. When I was, my fasting blood sugar was around 220." Does not check it daily. Last AIC was 9.1 on 12/05/20. CBG 181 in PAT.   Blood Thinner Instructions: n/a Aspirin Instructions: n/a  NPO at midnight  COVID TEST- 01/15/21 in PAT   Anesthesia review: yes, A1C 9.1 12/05/20  Patient denies shortness of breath, fever, cough and chest pain at PAT appointment   All instructions explained to the patient, with a verbal understanding of the material. Patient agrees to go over the instructions while at home for a better understanding. Patient also instructed to self quarantine after being tested for COVID-19. The opportunity to ask questions was provided.

## 2021-01-16 NOTE — Progress Notes (Signed)
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 May have had prolonged emergence with previous surgery.   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 She witnessed an event during the visit which was described as "spaced out" and "unable to respond momentarily". A brain MRI was ordered as well as labs. Labs showed elevated A1c 9.1%, but with known chronically uncontrolled DM. Januvia 50 mg daily added. MRI done on 12/21/20 and showed a right frontal-temporal mass concerning for glioma. He was referred to neuro-oncologist Dr. Mickeal Skinner and evaluated on 12/31/20. He was started on Keppra and referred to neurosurgery. Message left for [facility] at Dr. Marcello Moores' office regarding A1c results. He does not check home CBGs regularly. He will get a CBG on arrival for surgery. Non-fasting CBG with PAT labs was 137.    Presurgical COVID-19 test negative on 01/15/2021.  Anesthesia team to evaluate on the day of surgery.  He had a CT a of the head done on 01/15/2021 per Dr. Marcello Moores with the report still in process.   VS: BP 135/86   Pulse 68   Temp 36.8 C (Oral)   Resp 18   Ht '5\' 8"'$  (1.727 m)   Wt 74.2 kg   SpO2 99%   BMI 24.86 kg/m   PROVIDERS: Jearld Fenton, NP his PCP Cecil Cobbs, MD is Neuro-Oncologist   LABS: Preoperative labs noted. His A1c was 9.1% on 12/05/20, up from 8.9% 08/30/20 (range 8.4-9.7% since 01/25/16).  (all labs ordered are listed, but only abnormal results are displayed)  Labs Reviewed  GLUCOSE, CAPILLARY - Abnormal; Notable for the following components:      Result Value   Glucose-Capillary 181 (*)    All other components within normal limits  BASIC METABOLIC PANEL - Abnormal; Notable for the following components:   Glucose, Bld 137 (*)    All other components within normal limits  SARS CORONAVIRUS 2 (TAT 6-24 HRS)  CBC  TYPE AND SCREEN    IMAGES: CTA Head 01/15/21: In progress.  MRI Brain 12/21/20: IMPRESSION: 5.1 x 3.5 x 4.5 cm expansile region of T2/FLAIR hyperintense signal abnormality within the right frontal operculum and corona radiata, right insula and subinsular white matter, as well as inferolateral right frontal lobe and anterior right temporal lobe. There is vascular enhancement at this site, without definite abnormal parenchymal enhancement. The imaging features are most suggestive of a glioma (favor low-grade given the lack of definite abnormal parenchymal enhancement). Neuro-Oncology consultation is recommended. Local mass effect without significant ventricular effacement or midline shift.  CXR 05/16/20: FINDINGS: - Tracheal air column is midline. Cardiomediastinal contours and hilar structures are normal. - Lungs are clear. No sign of effusion. No consolidation. On limited assessment there is  no acute skeletal process. IMPRESSION: No acute cardiopulmonary disease.   EKG: 05/16/20: Normal sinus rhythm Minimal voltage criteria for LVH, may be normal variant ( R in aVL ) Borderline ECG   CV: He reported a normal echo greater than 20 years ago for evaluation of dizziness.  Past Medical History:  Diagnosis Date   Allergy    Complication of anesthesia    pt reports he is starting to  have more difficulty arousing after surgery   Diabetes mellitus ([place])    GERD (gastroesophageal reflux disease)    Headache    History of kidney stones    Hyperlipidemia    Renal disorder    kidney stones    Past Surgical History:  Procedure Laterality Date   COLONOSCOPY  09/03/1994   CYSTOSCOPY WITH STENT PLACEMENT Right 01/25/2016   Procedure: CYSTOSCOPY WITH STENT PLACEMENT;  Surgeon: Nickie Retort, MD;  Location: [facility] ORS;  Service: Urology;  Laterality: Right;   KNEE ARTHROSCOPY W/ MENISCAL REPAIR Right 06/23/1988   removal of birthmark  06/08/2013   SKIN CANCER EXCISION  06/23/2012   TYMPANOSTOMY TUBE PLACEMENT  08/06/1978   URETEROSCOPY WITH HOLMIUM LASER LITHOTRIPSY Right 01/25/2016   Procedure: URETEROSCOPY WITH HOLMIUM LASER LITHOTRIPSY;  Surgeon: Nickie Retort, MD;  Location: [facility] ORS;  Service: Urology;  Laterality: Right;   URETHRAL STRICTURE DILATATION     visual inspection of vocal cord     1973   WISDOM TOOTH EXTRACTION      MEDICATIONS:  aspirin-acetaminophen-caffeine (EXCEDRIN MIGRAINE) 250-250-65 MG tablet   atorvastatin (LIPITOR) 20 MG tablet   FREESTYLE LITE test strip   glipiZIDE (GLUCOTROL) 10 MG tablet   levETIRAcetam (KEPPRA) 500 MG tablet   pantoprazole (PROTONIX) 40 MG tablet   sitaGLIPtin (JANUVIA) 50 MG tablet   No current facility-administered medications for this encounter.    Myra Gianotti, PA-C Surgical Short Stay/Anesthesiology [facility] Phone [phone] [facility] Phone [phone] 01/16/2021 10:05 AM

## 2021-01-16 NOTE — Anesthesia Preprocedure Evaluation (Addendum)
Anesthesia Evaluation  Patient identified by MRN, date of birth, ID band Patient awake    Reviewed: Allergy & Precautions, NPO status , Patient's Chart, lab work & pertinent test results  History of Anesthesia Complications Negative for: history of anesthetic complications  Airway Mallampati: II  TM Distance: >3 FB Neck ROM: Full    Dental  (+) Dental Advisory Given   Pulmonary  01/15/2021 SARS coronavirus NEG   breath sounds clear to auscultation       Cardiovascular (-) hypertension(-) angina Rhythm:Regular Rate:Normal     Neuro/Psych  Headaches, Seizures -, Poorly Controlled,  Right fronto-temporal mass (glioma)    GI/Hepatic Neg liver ROS, GERD  Controlled and Medicated,  Endo/Other  diabetes (glu 172), Oral Hypoglycemic Agents  Renal/GU negative Renal ROS     Musculoskeletal   Abdominal   Peds  Hematology negative hematology ROS (+)   Anesthesia Other Findings   Reproductive/Obstetrics                           Anesthesia Physical Anesthesia Plan  ASA: 3  Anesthesia Plan: General   Post-op Pain Management:    Induction: Intravenous  PONV Risk Score and Plan: 2 and Ondansetron, Dexamethasone and Treatment may vary due to age or medical condition  Airway Management Planned: Oral ETT  Additional Equipment: Arterial line  Intra-op Plan:   Post-operative Plan: Extubation in OR  Informed Consent: I have reviewed the patients History and Physical, chart, labs and discussed the procedure including the risks, benefits and alternatives for the proposed anesthesia with the patient or authorized representative who has indicated his/her understanding and acceptance.     Dental advisory given  Plan Discussed with: CRNA and Surgeon  Anesthesia Plan Comments: (PAT note written 01/16/2021 by Myra Gianotti, PA-C. )      Anesthesia Quick Evaluation

## 2021-01-17 ENCOUNTER — Encounter (HOSPITAL_COMMUNITY)
Admission: RE | Disposition: A | Discharge status: Home / Self Care | Payer: Self-pay | Source: Home / Self Care | Attending: Neurosurgery

## 2021-01-17 ENCOUNTER — Inpatient Hospital Stay (HOSPITAL_COMMUNITY): Payer: BC Managed Care – PPO

## 2021-01-17 ENCOUNTER — Inpatient Hospital Stay (HOSPITAL_COMMUNITY)
Admission: RE | Admit: 2021-01-17 | Discharge: 2021-01-18 | DRG: 026 | Disposition: A | Discharge status: Home / Self Care | Payer: BC Managed Care – PPO | Attending: Neurosurgery | Admitting: Neurosurgery

## 2021-01-17 ENCOUNTER — Encounter (HOSPITAL_COMMUNITY): Payer: Self-pay

## 2021-01-17 ENCOUNTER — Inpatient Hospital Stay (HOSPITAL_COMMUNITY): Payer: BC Managed Care – PPO | Admitting: Certified Registered Nurse Anesthetist

## 2021-01-17 DIAGNOSIS — Z8249 Family history of ischemic heart disease and other diseases of the circulatory system: Secondary | ICD-10-CM | POA: Diagnosis not present

## 2021-01-17 DIAGNOSIS — C719 Malignant neoplasm of brain, unspecified: Secondary | ICD-10-CM | POA: Diagnosis present

## 2021-01-17 DIAGNOSIS — G43909 Migraine, unspecified, not intractable, without status migrainosus: Secondary | ICD-10-CM | POA: Diagnosis present

## 2021-01-17 DIAGNOSIS — E663 Overweight: Secondary | ICD-10-CM | POA: Diagnosis present

## 2021-01-17 DIAGNOSIS — G40209 Localization-related (focal) (partial) symptomatic epilepsy and epileptic syndromes with complex partial seizures, not intractable, without status epilepticus: Secondary | ICD-10-CM | POA: Diagnosis present

## 2021-01-17 DIAGNOSIS — Z83438 Family history of other disorder of lipoprotein metabolism and other lipidemia: Secondary | ICD-10-CM | POA: Diagnosis not present

## 2021-01-17 DIAGNOSIS — K219 Gastro-esophageal reflux disease without esophagitis: Secondary | ICD-10-CM | POA: Diagnosis present

## 2021-01-17 DIAGNOSIS — Z6824 Body mass index (BMI) 24.0-24.9, adult: Secondary | ICD-10-CM | POA: Diagnosis not present

## 2021-01-17 DIAGNOSIS — N529 Male erectile dysfunction, unspecified: Secondary | ICD-10-CM | POA: Diagnosis present

## 2021-01-17 DIAGNOSIS — Z20822 Contact with and (suspected) exposure to covid-19: Secondary | ICD-10-CM | POA: Diagnosis present

## 2021-01-17 DIAGNOSIS — E119 Type 2 diabetes mellitus without complications: Secondary | ICD-10-CM | POA: Diagnosis present

## 2021-01-17 DIAGNOSIS — Z7984 Long term (current) use of oral hypoglycemic drugs: Secondary | ICD-10-CM

## 2021-01-17 DIAGNOSIS — Z85828 Personal history of other malignant neoplasm of skin: Secondary | ICD-10-CM

## 2021-01-17 DIAGNOSIS — Z823 Family history of stroke: Secondary | ICD-10-CM

## 2021-01-17 DIAGNOSIS — Z91041 Radiographic dye allergy status: Secondary | ICD-10-CM

## 2021-01-17 DIAGNOSIS — Z79899 Other long term (current) drug therapy: Secondary | ICD-10-CM | POA: Diagnosis not present

## 2021-01-17 DIAGNOSIS — Z833 Family history of diabetes mellitus: Secondary | ICD-10-CM

## 2021-01-17 DIAGNOSIS — E785 Hyperlipidemia, unspecified: Secondary | ICD-10-CM | POA: Diagnosis present

## 2021-01-17 HISTORY — PX: APPLICATION OF CRANIAL NAVIGATION: SHX6578

## 2021-01-17 HISTORY — PX: FRAMELESS  BIOPSY WITH BRAINLAB: SHX6879

## 2021-01-17 LAB — GLUCOSE, CAPILLARY
Glucose-Capillary: 141 mg/dL — ABNORMAL HIGH (ref 70–99)
Glucose-Capillary: 172 mg/dL — ABNORMAL HIGH (ref 70–99)
Glucose-Capillary: 221 mg/dL — ABNORMAL HIGH (ref 70–99)
Glucose-Capillary: 287 mg/dL — ABNORMAL HIGH (ref 70–99)
Glucose-Capillary: 317 mg/dL — ABNORMAL HIGH (ref 70–99)

## 2021-01-17 LAB — ABO/RH: ABO/RH(D): O POS

## 2021-01-17 LAB — MRSA NEXT GEN BY PCR, NASAL: MRSA by PCR Next Gen: NOT DETECTED

## 2021-01-17 IMAGING — CT CT HEAD W/O CM
3 series · 15 of 47 positions shown, 18 images · non-contrast
Comparison: Head CTA [DATE].  Head MRI [DATE].

CLINICAL DATA: Brain/CNS neoplasm, assess treatment response; s/p
stereotactic biopsy.

EXAM:
CT HEAD WITHOUT CONTRAST
TECHNIQUE: Contiguous axial images were obtained from the base of the skull
through the vertex without intravenous contrast.

[Series 2: head 5.0 h30s · axial · 0.45mm/px · z∈[+1046,+1171]mm · 9 of 31 slices shown, 12 images]
[im 3/31  brain]
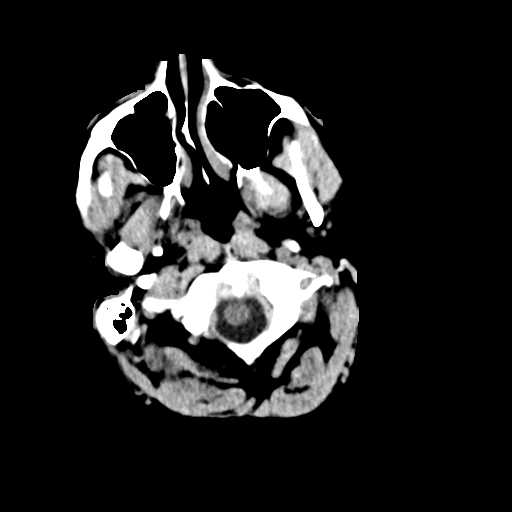
[im 3/31  bone]
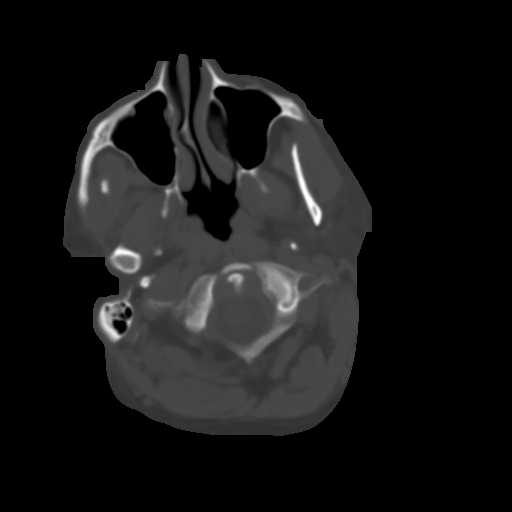
[im 6/31  brain]
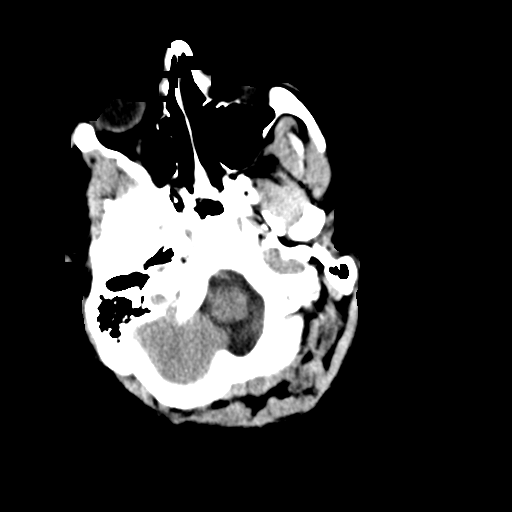
[im 9/31  brain]
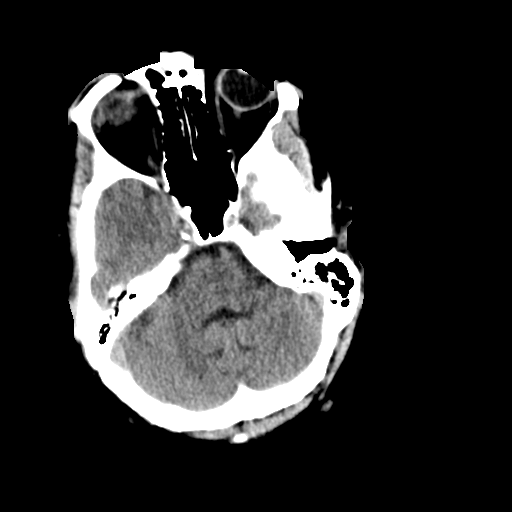
[im 12/31  brain]
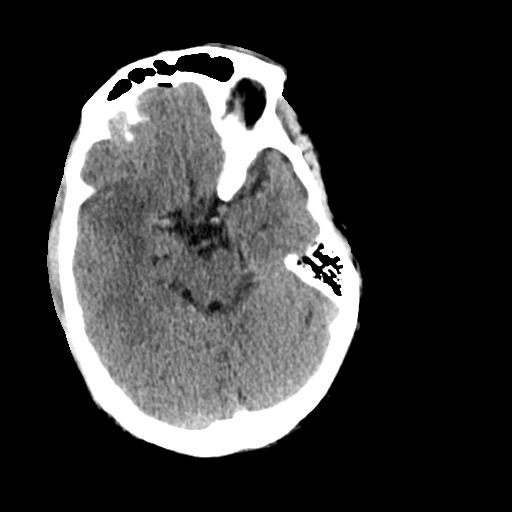
[im 16/31  brain]
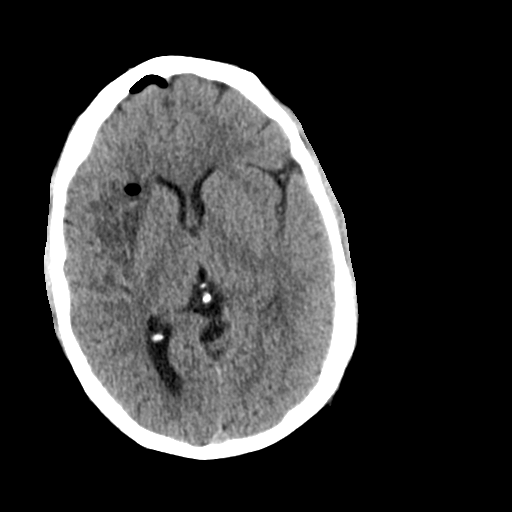
[im 16/31  bone]
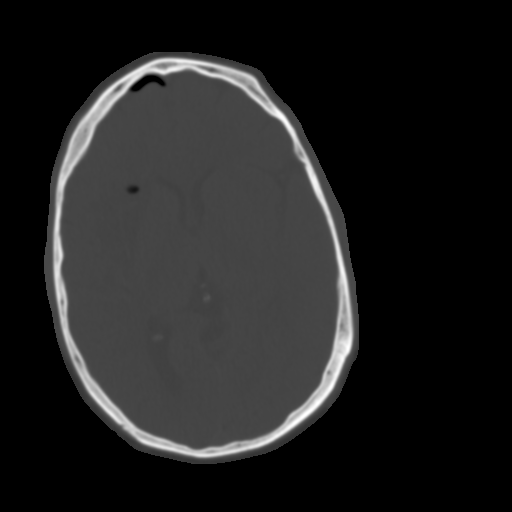
[im 19/31  brain]
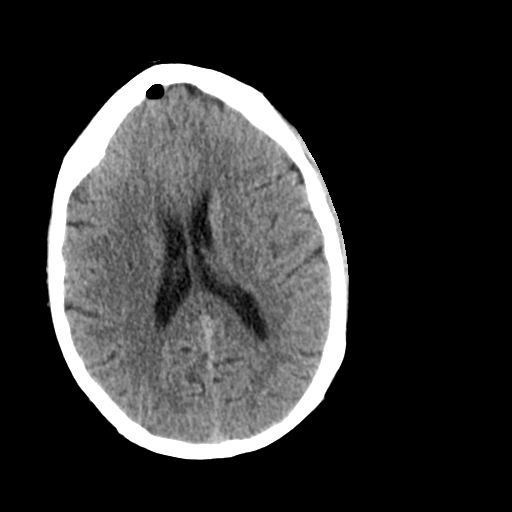
[im 22/31  brain]
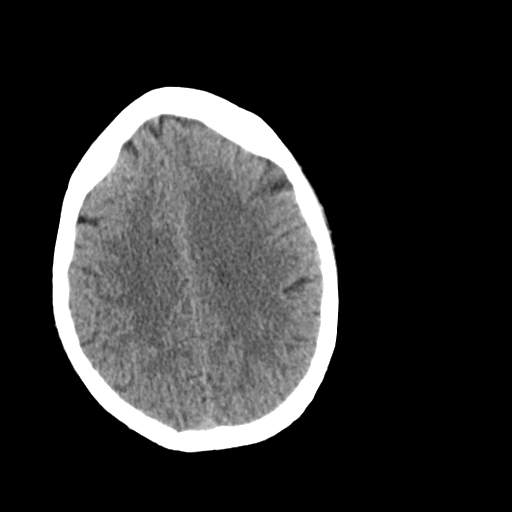
[im 25/31  brain]
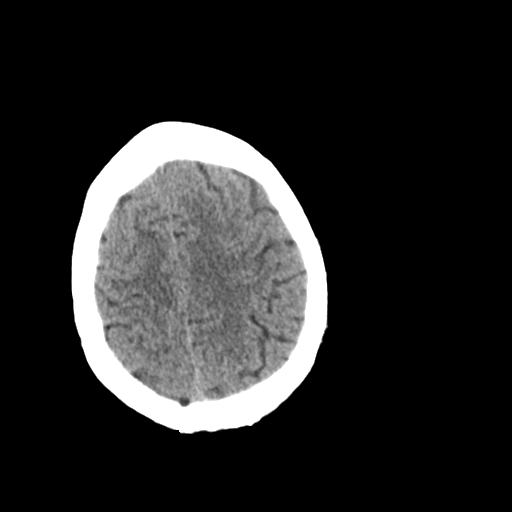
[im 28/31  brain]
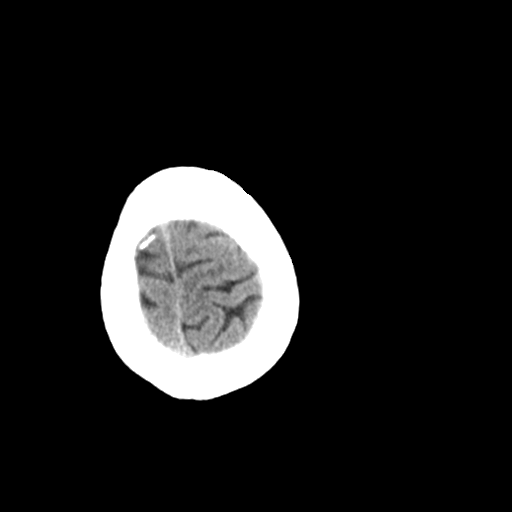
[im 28/31  bone]
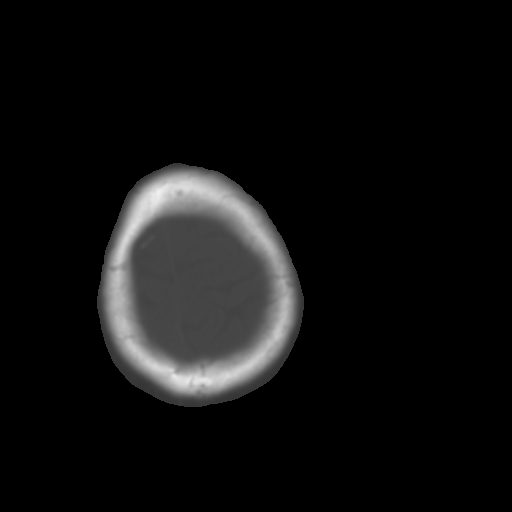

[Series 4: head 3.0 mpr cor · coronal · 0.35mm/px · 3 of 67 slices shown]
[im 23/67  brain]
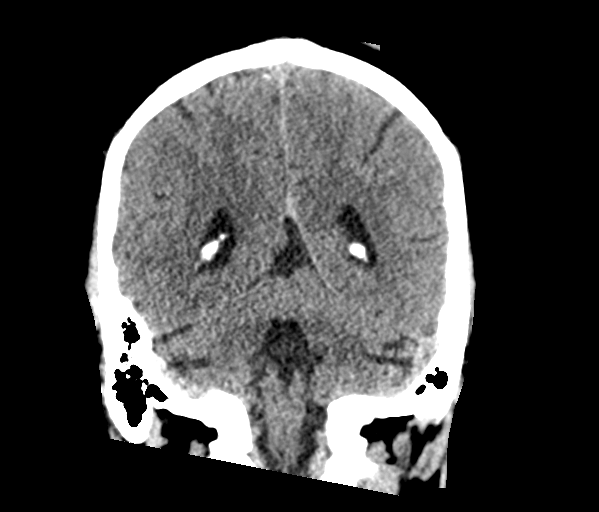
[im 30/67  brain]
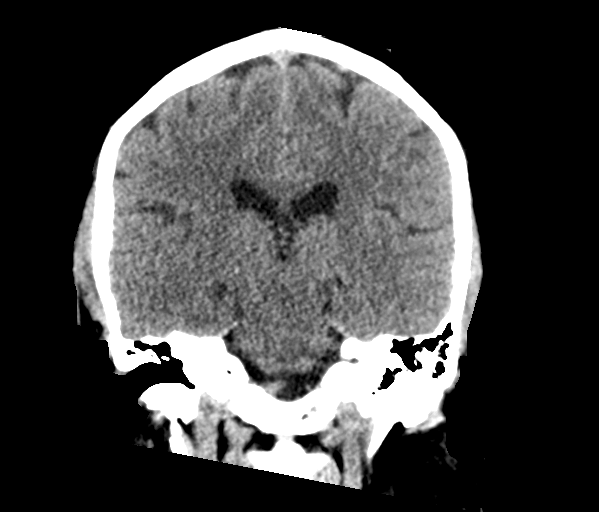
[im 37/67  brain]
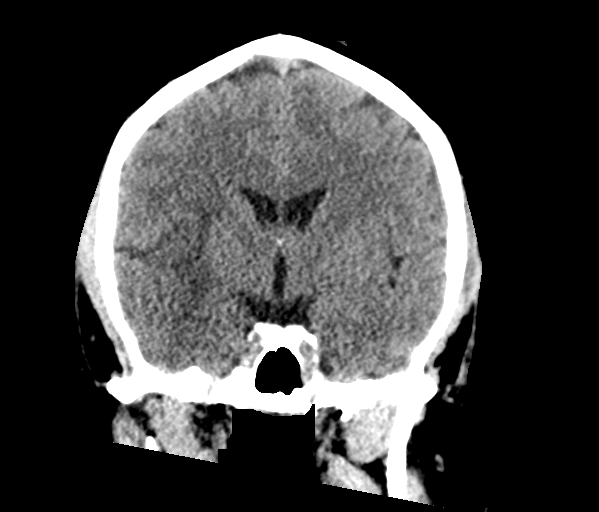

[Series 5: head 3.0 mpr sag · sagittal · 0.36mm/px · 3 of 53 slices shown]
[im 18/53  brain]
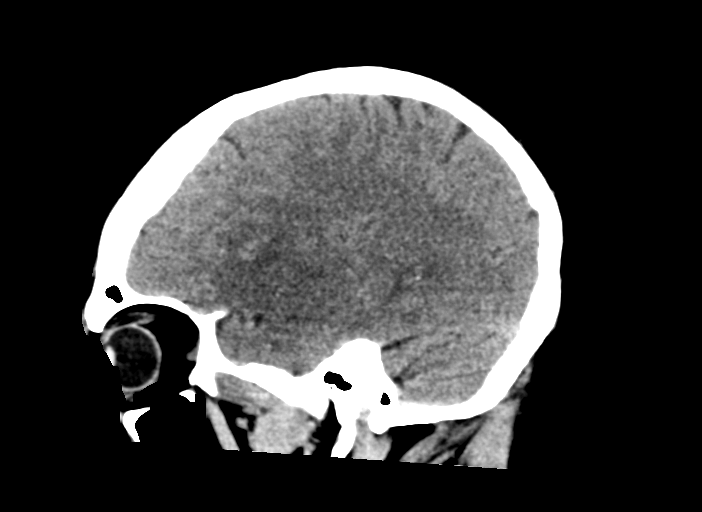
[im 27/53  brain]
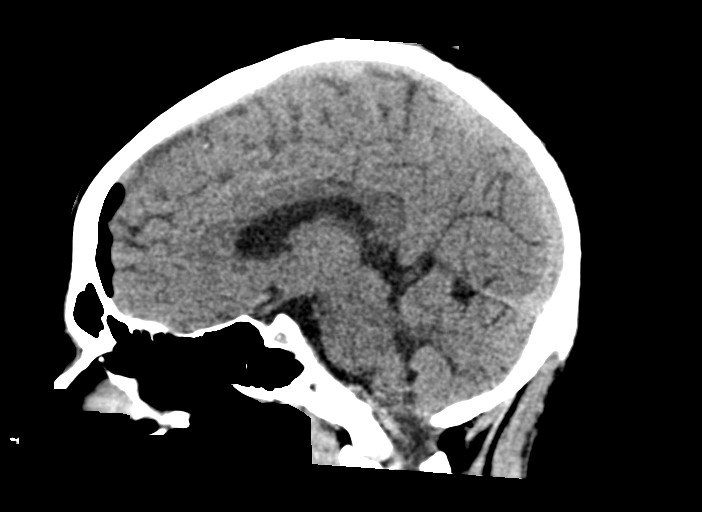
[im 35/53  brain]
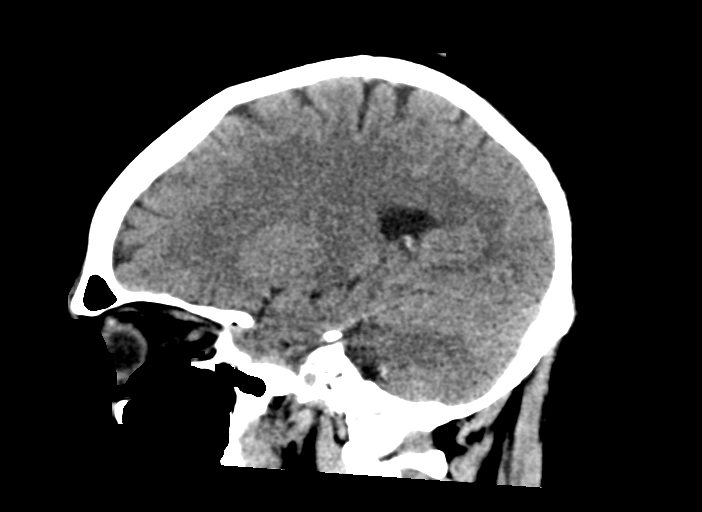

[15 of 47 positions shown; findings below may reference images not displayed]

FINDINGS: Brain: The infiltrative hypodense mass involving the right insula,
frontal operculum, and temporal lobe is unchanged in extent compared
to the recent CTA. There is a small locule of gas in the anterior
aspect of the lesion related to interval biopsy with a small amount
of adjacent acute petechial hemorrhage. Mild regional mass effect is
unchanged, and there is no midline shift. A small amount of
pneumocephalus is also noted anteriorly over the right frontal
convexity. There is no extra-axial fluid collection.

Vascular: Calcified atherosclerosis at the skull base.

Skull: Right frontal burr hole.

Sinuses/Orbits: Minimal right ethmoid air cell mucosal thickening.
Clear mastoid air cells. Unremarkable orbits.

Other: None.
IMPRESSION: Postoperative changes from interval right insular mass biopsy.

## 2021-01-17 SURGERY — FRAMELESS BIOPSY WITH BRAINLAB
Anesthesia: General | Site: Head | Laterality: Right

## 2021-01-17 MED ORDER — ACETAMINOPHEN 500 MG PO TABS
1000.0000 mg | ORAL_TABLET | Freq: Once | ORAL | Status: AC
  Administered 2021-01-17: 1000 mg via ORAL

## 2021-01-17 MED ORDER — DEXAMETHASONE SODIUM PHOSPHATE 10 MG/ML IJ SOLN
INTRAMUSCULAR | Status: AC
  Filled 2021-01-17: qty 1

## 2021-01-17 MED ORDER — ONDANSETRON HCL 4 MG/2ML IJ SOLN
INTRAMUSCULAR | Status: AC
  Filled 2021-01-17: qty 2

## 2021-01-17 MED ORDER — PANTOPRAZOLE SODIUM 40 MG PO TBEC
40.0000 mg | DELAYED_RELEASE_TABLET | Freq: Every day | ORAL | Status: DC
  Filled 2021-01-17: qty 1

## 2021-01-17 MED ORDER — PROPOFOL 10 MG/ML IV BOLUS
INTRAVENOUS | Status: AC
  Filled 2021-01-17: qty 20

## 2021-01-17 MED ORDER — LABETALOL HCL 5 MG/ML IV SOLN
INTRAVENOUS | Status: AC
  Filled 2021-01-17: qty 4

## 2021-01-17 MED ORDER — LIDOCAINE 2% (20 MG/ML) 5 ML SYRINGE
INTRAMUSCULAR | Status: DC | PRN
  Administered 2021-01-17: 20 mg via INTRAVENOUS

## 2021-01-17 MED ORDER — LABETALOL HCL 5 MG/ML IV SOLN
INTRAVENOUS | Status: DC | PRN
  Administered 2021-01-17: 5 mg via INTRAVENOUS

## 2021-01-17 MED ORDER — THROMBIN 5000 UNITS EX SOLR
OROMUCOSAL | Status: DC | PRN
  Administered 2021-01-17: 5 mL via TOPICAL

## 2021-01-17 MED ORDER — LIDOCAINE 2% (20 MG/ML) 5 ML SYRINGE
INTRAMUSCULAR | Status: AC
  Filled 2021-01-17: qty 5

## 2021-01-17 MED ORDER — PROMETHAZINE HCL 25 MG PO TABS: 12.5000 mg | ORAL_TABLET | ORAL | Status: DC | PRN

## 2021-01-17 MED ORDER — THROMBIN 5000 UNITS EX SOLR
CUTANEOUS | Status: AC
  Filled 2021-01-17: qty 5000

## 2021-01-17 MED ORDER — ROCURONIUM BROMIDE 10 MG/ML (PF) SYRINGE
PREFILLED_SYRINGE | INTRAVENOUS | Status: AC
  Filled 2021-01-17: qty 10

## 2021-01-17 MED ORDER — GLIPIZIDE 10 MG PO TABS
10.0000 mg | ORAL_TABLET | Freq: Two times a day (BID) | ORAL | Status: DC
  Administered 2021-01-18: 10 mg via ORAL
  Filled 2021-01-17 (×3): qty 1

## 2021-01-17 MED ORDER — DEXAMETHASONE 4 MG PO TABS: 4.0000 mg | ORAL_TABLET | Freq: Three times a day (TID) | ORAL | Status: DC

## 2021-01-17 MED ORDER — MIDAZOLAM HCL 2 MG/2ML IJ SOLN
INTRAMUSCULAR | Status: AC
  Filled 2021-01-17: qty 2

## 2021-01-17 MED ORDER — CEFAZOLIN SODIUM-DEXTROSE 1-4 GM/50ML-% IV SOLN
1.0000 g | Freq: Three times a day (TID) | INTRAVENOUS | Status: AC
  Administered 2021-01-17 – 2021-01-18 (×3): 1 g via INTRAVENOUS
  Filled 2021-01-17 (×3): qty 50

## 2021-01-17 MED ORDER — LIDOCAINE-EPINEPHRINE 1 %-1:100000 IJ SOLN
INTRAMUSCULAR | Status: DC | PRN
  Administered 2021-01-17: 2 mL

## 2021-01-17 MED ORDER — PHENYLEPHRINE 40 MCG/ML (10ML) SYRINGE FOR IV PUSH (FOR BLOOD PRESSURE SUPPORT)
PREFILLED_SYRINGE | INTRAVENOUS | Status: DC | PRN
  Administered 2021-01-17 (×2): 40 ug via INTRAVENOUS

## 2021-01-17 MED ORDER — POLYETHYLENE GLYCOL 3350 17 G PO PACK: 17.0000 g | PACK | Freq: Every day | ORAL | Status: DC | PRN

## 2021-01-17 MED ORDER — FLEET ENEMA 7-19 GM/118ML RE ENEM: 1.0000 | ENEMA | Freq: Once | RECTAL | Status: DC | PRN

## 2021-01-17 MED ORDER — ORAL CARE MOUTH RINSE: 15.0000 mL | Freq: Once | OROMUCOSAL | Status: AC

## 2021-01-17 MED ORDER — CHLORHEXIDINE GLUCONATE 0.12 % MT SOLN: 15.0000 mL | Freq: Once | OROMUCOSAL | Status: AC

## 2021-01-17 MED ORDER — OXYCODONE HCL 5 MG PO TABS: 5.0000 mg | ORAL_TABLET | Freq: Once | ORAL | Status: DC | PRN

## 2021-01-17 MED ORDER — LACTATED RINGERS IV SOLN: INTRAVENOUS | Status: DC | PRN

## 2021-01-17 MED ORDER — ROCURONIUM BROMIDE 100 MG/10ML IV SOLN
INTRAVENOUS | Status: DC | PRN
  Administered 2021-01-17: 70 mg via INTRAVENOUS
  Administered 2021-01-17: 10 mg via INTRAVENOUS

## 2021-01-17 MED ORDER — CHLORHEXIDINE GLUCONATE CLOTH 2 % EX PADS
6.0000 | MEDICATED_PAD | Freq: Every day | CUTANEOUS | Status: DC
  Administered 2021-01-17 – 2021-01-18 (×2): 6 via TOPICAL

## 2021-01-17 MED ORDER — FENTANYL CITRATE (PF) 250 MCG/5ML IJ SOLN
INTRAMUSCULAR | Status: AC
  Filled 2021-01-17: qty 5

## 2021-01-17 MED ORDER — MIDAZOLAM HCL 5 MG/5ML IJ SOLN
INTRAMUSCULAR | Status: DC | PRN
  Administered 2021-01-17: 2 mg via INTRAVENOUS

## 2021-01-17 MED ORDER — ACETAMINOPHEN 500 MG PO TABS
ORAL_TABLET | ORAL | Status: AC
  Filled 2021-01-17: qty 2

## 2021-01-17 MED ORDER — BACITRACIN ZINC 500 UNIT/GM EX OINT
TOPICAL_OINTMENT | CUTANEOUS | Status: DC | PRN
  Administered 2021-01-17: 1 via TOPICAL

## 2021-01-17 MED ORDER — CHLORHEXIDINE GLUCONATE CLOTH 2 % EX PADS: 6.0000 | MEDICATED_PAD | Freq: Once | CUTANEOUS | Status: DC

## 2021-01-17 MED ORDER — CHLORHEXIDINE GLUCONATE 0.12 % MT SOLN
OROMUCOSAL | Status: AC
  Administered 2021-01-17: 15 mL via OROMUCOSAL
  Filled 2021-01-17: qty 15

## 2021-01-17 MED ORDER — LABETALOL HCL 5 MG/ML IV SOLN
10.0000 mg | INTRAVENOUS | Status: DC | PRN
  Administered 2021-01-17 (×3): 10 mg via INTRAVENOUS
  Filled 2021-01-17 (×3): qty 4

## 2021-01-17 MED ORDER — BACITRACIN ZINC 500 UNIT/GM EX OINT
TOPICAL_OINTMENT | CUTANEOUS | Status: AC
  Filled 2021-01-17: qty 28.35

## 2021-01-17 MED ORDER — DOCUSATE SODIUM 100 MG PO CAPS
100.0000 mg | ORAL_CAPSULE | Freq: Two times a day (BID) | ORAL | Status: DC
  Administered 2021-01-17 – 2021-01-18 (×2): 100 mg via ORAL
  Filled 2021-01-17 (×2): qty 1

## 2021-01-17 MED ORDER — DIPHENHYDRAMINE HCL 50 MG/ML IJ SOLN
INTRAMUSCULAR | Status: DC | PRN
  Administered 2021-01-17: 6.25 mg via INTRAVENOUS

## 2021-01-17 MED ORDER — MEPERIDINE HCL 25 MG/ML IJ SOLN: 6.2500 mg | INTRAMUSCULAR | Status: DC | PRN

## 2021-01-17 MED ORDER — ONDANSETRON HCL 4 MG PO TABS: 4.0000 mg | ORAL_TABLET | ORAL | Status: DC | PRN

## 2021-01-17 MED ORDER — INSULIN ASPART 100 UNIT/ML IJ SOLN
0.0000 [IU] | Freq: Three times a day (TID) | INTRAMUSCULAR | Status: DC
  Administered 2021-01-17: 5 [IU] via SUBCUTANEOUS

## 2021-01-17 MED ORDER — ACETAMINOPHEN 650 MG RE SUPP: 650.0000 mg | RECTAL | Status: DC | PRN

## 2021-01-17 MED ORDER — THROMBIN 20000 UNITS EX SOLR
CUTANEOUS | Status: AC
  Filled 2021-01-17: qty 20000

## 2021-01-17 MED ORDER — ONDANSETRON HCL 4 MG/2ML IJ SOLN
4.0000 mg | INTRAMUSCULAR | Status: DC | PRN
  Administered 2021-01-17: 4 mg via INTRAVENOUS
  Filled 2021-01-17: qty 2

## 2021-01-17 MED ORDER — SUGAMMADEX SODIUM 200 MG/2ML IV SOLN
INTRAVENOUS | Status: DC | PRN
  Administered 2021-01-17: 200 mg via INTRAVENOUS

## 2021-01-17 MED ORDER — ATORVASTATIN CALCIUM 10 MG PO TABS
20.0000 mg | ORAL_TABLET | Freq: Every day | ORAL | Status: DC
  Administered 2021-01-18: 20 mg via ORAL
  Filled 2021-01-17: qty 2

## 2021-01-17 MED ORDER — NICARDIPINE HCL IN NACL 20-0.86 MG/200ML-% IV SOLN
3.0000 mg/h | INTRAVENOUS | Status: DC
  Administered 2021-01-17: 3 mg/h via INTRAVENOUS
  Filled 2021-01-17: qty 200

## 2021-01-17 MED ORDER — THROMBIN 20000 UNITS EX SOLR
CUTANEOUS | Status: DC | PRN
  Administered 2021-01-17: 20 mL via TOPICAL

## 2021-01-17 MED ORDER — LIDOCAINE-EPINEPHRINE 1 %-1:100000 IJ SOLN
INTRAMUSCULAR | Status: AC
  Filled 2021-01-17: qty 1

## 2021-01-17 MED ORDER — PHENYLEPHRINE HCL-NACL 10-0.9 MG/250ML-% IV SOLN
INTRAVENOUS | Status: DC | PRN
  Administered 2021-01-17: 25 ug/min via INTRAVENOUS

## 2021-01-17 MED ORDER — LACTATED RINGERS IV SOLN: INTRAVENOUS | Status: DC

## 2021-01-17 MED ORDER — LINAGLIPTIN 5 MG PO TABS
5.0000 mg | ORAL_TABLET | Freq: Every day | ORAL | Status: DC
  Administered 2021-01-18: 5 mg via ORAL
  Filled 2021-01-17 (×2): qty 1

## 2021-01-17 MED ORDER — ONDANSETRON HCL 4 MG/2ML IJ SOLN
INTRAMUSCULAR | Status: DC | PRN
  Administered 2021-01-17 (×2): 4 mg via INTRAVENOUS

## 2021-01-17 MED ORDER — MORPHINE SULFATE (PF) 2 MG/ML IV SOLN: 1.0000 mg | INTRAVENOUS | Status: DC | PRN

## 2021-01-17 MED ORDER — DEXAMETHASONE SODIUM PHOSPHATE 10 MG/ML IJ SOLN
INTRAMUSCULAR | Status: DC | PRN
  Administered 2021-01-17: 10 mg via INTRAVENOUS

## 2021-01-17 MED ORDER — PROMETHAZINE HCL 25 MG/ML IJ SOLN: 6.2500 mg | INTRAMUSCULAR | Status: DC | PRN

## 2021-01-17 MED ORDER — FENTANYL CITRATE (PF) 100 MCG/2ML IJ SOLN
INTRAMUSCULAR | Status: DC | PRN
  Administered 2021-01-17 (×2): 50 ug via INTRAVENOUS
  Administered 2021-01-17: 400 ug via INTRAVENOUS

## 2021-01-17 MED ORDER — DIPHENHYDRAMINE HCL 50 MG/ML IJ SOLN
INTRAMUSCULAR | Status: AC
  Administered 2021-01-17: 50 mg
  Filled 2021-01-17: qty 1

## 2021-01-17 MED ORDER — LEVETIRACETAM 500 MG PO TABS
500.0000 mg | ORAL_TABLET | Freq: Two times a day (BID) | ORAL | Status: DC
  Administered 2021-01-17 – 2021-01-18 (×2): 500 mg via ORAL
  Filled 2021-01-17 (×2): qty 1

## 2021-01-17 MED ORDER — DIPHENHYDRAMINE HCL 50 MG/ML IJ SOLN
INTRAMUSCULAR | Status: AC
  Filled 2021-01-17: qty 1

## 2021-01-17 MED ORDER — OXYCODONE HCL 5 MG/5ML PO SOLN: 5.0000 mg | Freq: Once | ORAL | Status: DC | PRN

## 2021-01-17 MED ORDER — DEXAMETHASONE 4 MG PO TABS: 4.0000 mg | ORAL_TABLET | Freq: Four times a day (QID) | ORAL | Status: DC

## 2021-01-17 MED ORDER — PROPOFOL 10 MG/ML IV BOLUS
INTRAVENOUS | Status: DC | PRN
  Administered 2021-01-17: 20 mg via INTRAVENOUS

## 2021-01-17 MED ORDER — CEFAZOLIN SODIUM-DEXTROSE 2-4 GM/100ML-% IV SOLN
2.0000 g | INTRAVENOUS | Status: AC
  Administered 2021-01-17: 2 g via INTRAVENOUS

## 2021-01-17 MED ORDER — 0.9 % SODIUM CHLORIDE (POUR BTL) OPTIME
TOPICAL | Status: DC | PRN
  Administered 2021-01-17: 3000 mL

## 2021-01-17 MED ORDER — DEXAMETHASONE 4 MG PO TABS
6.0000 mg | ORAL_TABLET | Freq: Four times a day (QID) | ORAL | Status: AC
  Administered 2021-01-17 – 2021-01-18 (×4): 6 mg via ORAL
  Filled 2021-01-17 (×4): qty 2

## 2021-01-17 MED ORDER — CEFAZOLIN SODIUM-DEXTROSE 2-4 GM/100ML-% IV SOLN
INTRAVENOUS | Status: AC
  Filled 2021-01-17: qty 100

## 2021-01-17 MED ORDER — MIDAZOLAM HCL 2 MG/2ML IJ SOLN: 0.5000 mg | Freq: Once | INTRAMUSCULAR | Status: DC | PRN

## 2021-01-17 MED ORDER — ACETAMINOPHEN 325 MG PO TABS: 650.0000 mg | ORAL_TABLET | ORAL | Status: DC | PRN

## 2021-01-17 MED ORDER — HYDROMORPHONE HCL 1 MG/ML IJ SOLN: 0.2500 mg | INTRAMUSCULAR | Status: DC | PRN

## 2021-01-17 MED ORDER — POTASSIUM CHLORIDE IN NACL 20-0.9 MEQ/L-% IV SOLN
INTRAVENOUS | Status: DC
  Filled 2021-01-17 (×2): qty 1000

## 2021-01-17 MED ORDER — HYDROCODONE-ACETAMINOPHEN 5-325 MG PO TABS
1.0000 | ORAL_TABLET | ORAL | Status: DC | PRN
Start: 2021-01-17 — End: 2021-01-18

## 2021-01-17 MED ORDER — ENALAPRILAT 1.25 MG/ML IV SOLN: 1.2500 mg | Freq: Four times a day (QID) | INTRAVENOUS | Status: DC | PRN

## 2021-01-17 SURGICAL SUPPLY — 75 items
BAG COUNTER SPONGE SURGICOUNT (BAG) ×2 IMPLANT
BAND RUBBER #18 3X1/16 STRL (MISCELLANEOUS) ×4 IMPLANT
BENZOIN TINCTURE PRP APPL 2/3 (GAUZE/BANDAGES/DRESSINGS) IMPLANT
BIT DRILL LONG 3.0X30 (BIT) IMPLANT
BLADE CLIPPER SURG (BLADE) ×2 IMPLANT
BNDG GAUZE ELAST 4 BULKY (GAUZE/BANDAGES/DRESSINGS) IMPLANT
BNDG STRETCH 4X75 STRL LF (GAUZE/BANDAGES/DRESSINGS) IMPLANT
BUR ACORN 9.0 PRECISION (BURR) IMPLANT
BUR ROUND FLUTED 4 SOFT TCH (BURR) IMPLANT
CANISTER SUCT 3000ML PPV (MISCELLANEOUS) ×4 IMPLANT
CATH COUDE FOLEY 5CC 14FR (CATHETERS) ×2 IMPLANT
CNTNR URN SCR LID CUP LEK RST (MISCELLANEOUS) ×1 IMPLANT
CONT SPEC 4OZ STRL OR WHT (MISCELLANEOUS) ×1
COVER MAYO STAND STRL (DRAPES) IMPLANT
DECANTER SPIKE VIAL GLASS SM (MISCELLANEOUS) ×2 IMPLANT
DERMABOND ADVANCED (GAUZE/BANDAGES/DRESSINGS) ×1
DERMABOND ADVANCED .7 DNX12 (GAUZE/BANDAGES/DRESSINGS) ×1 IMPLANT
DRAPE HALF SHEET 40X57 (DRAPES) ×2 IMPLANT
DRAPE ORTHO SPLIT 77X108 STRL (DRAPES) ×1
DRAPE SHEET LG 3/4 BI-LAMINATE (DRAPES) ×2 IMPLANT
DRAPE STERI IOBAN 125X83 (DRAPES) IMPLANT
DRAPE SURG 17X23 STRL (DRAPES) IMPLANT
DRAPE SURG ORHT 6 SPLT 77X108 (DRAPES) ×1 IMPLANT
DRAPE WARM FLUID 44X44 (DRAPES) ×2 IMPLANT
DRSG ADAPTIC 3X8 NADH LF (GAUZE/BANDAGES/DRESSINGS) IMPLANT
DRSG TELFA 3X8 NADH (GAUZE/BANDAGES/DRESSINGS) ×2 IMPLANT
DURAPREP 6ML APPLICATOR 50/CS (WOUND CARE) ×2 IMPLANT
ELECT COATED BLADE 2.86 ST (ELECTRODE) ×2 IMPLANT
ELECT REM PT RETURN 9FT ADLT (ELECTROSURGICAL) ×2
ELECTRODE REM PT RTRN 9FT ADLT (ELECTROSURGICAL) ×1 IMPLANT
GAUZE 4X4 16PLY ~~LOC~~+RFID DBL (SPONGE) ×2 IMPLANT
GAUZE SPONGE 4X4 12PLY STRL (GAUZE/BANDAGES/DRESSINGS) IMPLANT
GLOVE EXAM NITRILE LRG STRL (GLOVE) IMPLANT
GLOVE EXAM NITRILE XS STR PU (GLOVE) IMPLANT
GLOVE SRG 8 PF TXTR STRL LF DI (GLOVE) ×1 IMPLANT
GLOVE SURG ENC MOIS LTX SZ7.5 (GLOVE) ×4 IMPLANT
GLOVE SURG LTX SZ7.5 (GLOVE) IMPLANT
GLOVE SURG LTX SZ8 (GLOVE) ×2 IMPLANT
GLOVE SURG UNDER POLY LF SZ7 (GLOVE) ×6 IMPLANT
GLOVE SURG UNDER POLY LF SZ7.5 (GLOVE) ×4 IMPLANT
GLOVE SURG UNDER POLY LF SZ8 (GLOVE) ×1
GOWN STRL REUS W/ TWL LRG LVL3 (GOWN DISPOSABLE) ×2 IMPLANT
GOWN STRL REUS W/ TWL XL LVL3 (GOWN DISPOSABLE) ×2 IMPLANT
GOWN STRL REUS W/TWL 2XL LVL3 (GOWN DISPOSABLE) IMPLANT
GOWN STRL REUS W/TWL LRG LVL3 (GOWN DISPOSABLE) ×2
GOWN STRL REUS W/TWL XL LVL3 (GOWN DISPOSABLE) ×2
HEMOSTAT POWDER KIT SURGIFOAM (HEMOSTASIS) ×2 IMPLANT
HEMOSTAT SURGICEL 2X14 (HEMOSTASIS) IMPLANT
KIT BASIN OR (CUSTOM PROCEDURE TRAY) ×2 IMPLANT
KIT NEEDLE BIOPSY 1.8X235 CRAN (NEEDLE) ×2 IMPLANT
KIT TURNOVER KIT B (KITS) ×2 IMPLANT
KIT VARIOGUIDE DRILL 1.9 (KITS) ×2 IMPLANT
MARKER SPHERE PSV REFLC 13MM (MARKER) ×6 IMPLANT
NEEDLE HYPO 22GX1.5 SAFETY (NEEDLE) ×2 IMPLANT
NEEDLE SPNL 18GX3.5 QUINCKE PK (NEEDLE) ×2 IMPLANT
NS IRRIG 1000ML POUR BTL (IV SOLUTION) ×4 IMPLANT
PACK CRANIOTOMY CUSTOM (CUSTOM PROCEDURE TRAY) ×2 IMPLANT
PIN MAYFIELD SKULL DISP (PIN) ×2 IMPLANT
SPONGE SURGIFOAM ABS GEL 100 (HEMOSTASIS) ×2 IMPLANT
SPONGE SURGIFOAM ABS GEL SZ50 (HEMOSTASIS) IMPLANT
STAPLER VISISTAT 35W (STAPLE) ×2 IMPLANT
SUT ETHILON 3 0 FSL (SUTURE) IMPLANT
SUT ETHILON 3 0 PS 1 (SUTURE) IMPLANT
SUT MON AB 3-0 SH 27 (SUTURE)
SUT MON AB 3-0 SH27 (SUTURE) IMPLANT
SUT NURALON 4 0 TR CR/8 (SUTURE) IMPLANT
SUT SILK 0 TIES 10X30 (SUTURE) IMPLANT
SUT VIC AB 2-0 CP2 18 (SUTURE) IMPLANT
SUT VICRYL RAPIDE 3/0 (SUTURE) IMPLANT
SYR 3ML LL SCALE MARK (SYRINGE) ×6 IMPLANT
TOWEL GREEN STERILE (TOWEL DISPOSABLE) ×2 IMPLANT
TOWEL GREEN STERILE FF (TOWEL DISPOSABLE) ×2 IMPLANT
TRAY FOLEY MTR SLVR 16FR STAT (SET/KITS/TRAYS/PACK) ×2 IMPLANT
UNDERPAD 30X36 HEAVY ABSORB (UNDERPADS AND DIAPERS) ×2 IMPLANT
WATER STERILE IRR 1000ML POUR (IV SOLUTION) ×2 IMPLANT

## 2021-01-17 NOTE — Anesthesia Procedure Notes (Signed)
Procedure Name: Intubation Date/Time: 01/17/2021 11:16 AM Performed by: Gwyndolyn Saxon, CRNA Pre-anesthesia Checklist: Patient identified, Emergency Drugs available, Suction available and Patient being monitored Patient Re-evaluated:Patient Re-evaluated prior to induction Oxygen Delivery Method: Circle system utilized Preoxygenation: Pre-oxygenation with 100% oxygen Induction Type: IV induction Ventilation: Mask ventilation without difficulty and Oral airway inserted - appropriate to patient size Laryngoscope Size: Mac and 4 Grade View: Grade I Tube type: Oral Tube size: 7.5 mm Number of attempts: 1 Airway Equipment and Method: Patient positioned with wedge pillow and Stylet Placement Confirmation: ETT inserted through vocal cords under direct vision, positive ETCO2 and breath sounds checked- equal and bilateral Secured at: 22 cm Tube secured with: Tape Dental Injury: Teeth and Oropharynx as per pre-operative assessment

## 2021-01-17 NOTE — Anesthesia Procedure Notes (Signed)
Arterial Line Insertion Start/End7/28/2022 10:41 AM, 01/17/2021 10:48 AM Performed by: Annye Asa, MD, Gwyndolyn Saxon, CRNA, anesthesiologist  Patient location: Pre-op. Preanesthetic checklist: patient identified, IV checked, site marked, risks and benefits discussed, surgical consent, monitors and equipment checked, pre-op evaluation, timeout performed and anesthesia consent Lidocaine 1% used for infiltration Right, radial was placed Hand hygiene performed , maximum sterile barriers used  and Seldinger technique used Allen's test indicative of satisfactory collateral circulation Attempts: 2 Procedure performed using ultrasound guided technique. Ultrasound Notes:anatomy identified, needle tip was noted to be adjacent to the nerve/plexus identified and no ultrasound evidence of intravascular and/or intraneural injection Following insertion, dressing applied and Biopatch. Post procedure assessment: normal  Post procedure complications: second provider assisted. Patient tolerated the procedure well with no immediate complications.

## 2021-01-17 NOTE — H&P (Signed)
CC: right insular brain mass  HPI:     Patient is a 50 y.o. 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  He is here today for elective stereotactic biopsy.    Patient Active Problem List   Diagnosis Date Noted   Brain mass 12/31/2020   Focal seizures ([place]) 12/31/2020   Overweight (BMI 25.0-29.9) 12/06/2020   Gastroesophageal reflux disease 08/30/2020   Erectile dysfunction 08/30/2020   Type 2 diabetes mellitus with hyperglycemia ([place]) 06/12/2014   HLD (hyperlipidemia) 06/12/2014   Past Medical History:  Diagnosis Date   Allergy    Complication of anesthesia    pt reports he is starting to have more difficulty arousing after surgery   Diabetes mellitus ([place])    GERD (gastroesophageal reflux disease)    Headache    History of kidney stones    Hyperlipidemia    Renal disorder    kidney stones    Past Surgical History:  Procedure Laterality Date   COLONOSCOPY  09/03/1994   CYSTOSCOPY WITH STENT PLACEMENT Right 01/25/2016   Procedure: CYSTOSCOPY WITH STENT PLACEMENT;  Surgeon: Nickie Retort, MD;  Location: [facility] ORS;  Service: Urology;  Laterality: Right;   KNEE ARTHROSCOPY W/ MENISCAL REPAIR Right 06/23/1988   removal of birthmark  06/08/2013   SKIN CANCER EXCISION  06/23/2012   TYMPANOSTOMY TUBE PLACEMENT  08/06/1978   URETEROSCOPY WITH HOLMIUM LASER LITHOTRIPSY Right 01/25/2016   Procedure: URETEROSCOPY WITH HOLMIUM LASER LITHOTRIPSY;  Surgeon: Nickie Retort, MD;  Location: [facility] ORS;  Service: Urology;  Laterality: Right;   URETHRAL STRICTURE DILATATION     visual inspection of vocal cord     1973   WISDOM TOOTH EXTRACTION      Medications Prior to Admission  Medication Sig Dispense Refill Last Dose   aspirin-acetaminophen-caffeine (EXCEDRIN MIGRAINE) 250-250-65 MG tablet Take 2 tablets by mouth every 6 (six) hours as needed for headache.    Past Week   atorvastatin (LIPITOR) 20 MG tablet Take 1 tablet (20 mg total) by mouth daily. 90 tablet 3 01/17/2021   glipiZIDE (GLUCOTROL) 10 MG tablet TAKE 1 TABLET (10 MG TOTAL) BY MOUTH 2 (TWO) TIMES DAILY BEFORE A MEAL. (Patient taking differently: Take 10 mg by mouth 2 (two) times daily.) 180 tablet 0 01/16/2021   levETIRAcetam (KEPPRA) 500 MG tablet Take 1 tablet (500 mg total) by mouth 2 (two) times daily. 60 tablet 3 01/17/2021   sitaGLIPtin (JANUVIA) 50 MG tablet Take 1 tablet (50 mg total) by mouth daily. 90 tablet 0 01/16/2021   FREESTYLE LITE test strip USE TWO TIMES DAILY TO TEST BLOOD SUGAR 50 each 11    pantoprazole (PROTONIX) 40 MG tablet Take 1 tablet (40 mg total) by mouth daily. (Patient not taking: Reported on 01/10/2021) 30 tablet 11 Not Taking   Allergies  Allergen Reactions   Contrast Media [Iodinated Diagnostic Agents] Swelling    Social History   Tobacco Use   Smoking status: Never   Smokeless tobacco: Never  Substance Use Topics   Alcohol use: Yes    Alcohol/week: 0.0 standard drinks    Comment: rare    Family History  Problem Relation Age of Onset   Hyperlipidemia Father    Hypertension Father    Diabetes Father    Benign prostatic hyperplasia Father    Stroke Maternal Grandmother    Diabetes Maternal Grandmother    Stroke Paternal Grandmother  Hypertension Paternal Grandmother    Kidney disease Neg Hx    Prostate cancer Neg Hx      Review of Systems Pertinent items noted in HPI and remainder of comprehensive ROS otherwise negative.  Objective:   Patient Vitals for the past 8 hrs:  BP Temp Temp src Pulse Resp SpO2 Height Weight  01/17/21 0908 -- -- -- -- -- -- '5\' 8"'$  (1.727 m) 74.2 kg  01/17/21 0900 (!) 145/89 98.1 F (36.7 C) Oral 74 18 98 % -- --   No intake/output data recorded. No intake/output data recorded.      General : Alert, cooperative, no distress, appears stated age   Head:  Normocephalic/atraumatic    Eyes: PERRL,  conjunctiva/corneas clear, EOM's intact. Fundi could not be visualized Neck: Supple Chest:  Respirations unlabored Chest wall: no tenderness or deformity Heart: Regular rate and rhythm Abdomen: Soft, nontender and nondistended Extremities: warm and well-perfused Skin: normal turgor, color and texture Neurologic:  Alert, oriented x 3.  Eyes open spontaneously. PERRL, EOMI, VFC, no facial droop. V1-3 intact.  No dysarthria, tongue protrusion symmetric.  CNII-XII intact. Normal strength, sensation and reflexes throughout.  No pronator drift, full strength in legs       Data Review:  MRI and recent CTA head were reviewed.  Right insular mass, consistent with insular glioma.  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  As he is left-handed and he had language deficits as part of the seizure semiology, he would require awake craniotomy for resection.  They preferred proceeding with stereotactic biopsy for obtaining a diagnosis at this juncture which would be reasonable given the significant risks of surgical resection.  Risk, benefits, alternatives, and expected convalescence were discussed.  Risks discussed included, but were not limited to, bleeding, pain, infection, scar, seizure, stroke, neurologic deficit, nondiagnostic tissue, coma, and death.  Informed consent was obtained.

## 2021-01-17 NOTE — Anesthesia Postprocedure Evaluation (Signed)
Anesthesia Post Note  Patient: [name]  Procedure(s) Performed: RIGHT STEREOTACTIC BIOPSY OF INSULAR LESION (Right: Head) APPLICATION OF CRANIAL NAVIGATION (Right: Head)     Patient location during evaluation: ICU Anesthesia Type: General Level of consciousness: awake and alert, patient cooperative and oriented Pain management: pain level controlled Vital Signs Assessment: post-procedure vital signs reviewed and stable Respiratory status: nonlabored ventilation, spontaneous breathing and respiratory function stable Cardiovascular status: blood pressure returned to baseline and stable Postop Assessment: no apparent nausea or vomiting and adequate PO intake Anesthetic complications: no   No notable events documented.  Last Vitals:  Vitals:   01/17/21 1700 01/17/21 1800  BP: 99/69 106/69  Pulse: 69 74  Resp: 10 11  Temp:    SpO2: 94% 96%    Last Pain:  Vitals:   01/17/21 1600  TempSrc: Axillary  PainSc:                  Damiah Mcdonald,E. Jaydence Arnesen

## 2021-01-17 NOTE — Transfer of Care (Signed)
Immediate Anesthesia Transfer of Care Note  Patient: Alan Wise  Procedure(s) Performed: RIGHT STEREOTACTIC BIOPSY OF INSULAR LESION (Right: Head) APPLICATION OF CRANIAL NAVIGATION (Right: Head)  Patient Location: PACU  Anesthesia Type:General  Level of Consciousness: drowsy and patient cooperative  Airway & Oxygen Therapy: Patient Spontanous Breathing and Patient connected to face mask oxygen  Post-op Assessment: Report given to RN and Post -op Vital signs reviewed and stable  Post vital signs: Reviewed and stable  Last Vitals:  Vitals Value Taken Time  BP 123/89 01/17/21 1342  Temp    Pulse 70 01/17/21 1346  Resp 15 01/17/21 1346  SpO2 98 % 01/17/21 1346  Vitals shown include unvalidated device data.  Last Pain:  Vitals:   01/17/21 0908  TempSrc:   PainSc: 0-No pain      Patients Stated Pain Goal: 0 ([id number])  Complications: No notable events documented.

## 2021-01-17 NOTE — Op Note (Signed)
Procedure(s): RIGHT STEREOTACTIC BIOPSY OF INSULAR LESION APPLICATION OF CRANIAL NAVIGATION Procedure Note  DIN ALHASSAN male 50 y.o. 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  I discussed with him initial treatment options and he elected to proceed with biopsy.  Risk, benefits, alternatives, and expected convalescence were discussed with him and his wife.  Informed consent was obtained.     Surgeon: Vallarie Mare   Assistants: Elwin Sleight, DO.  Please note there were no qualified trainees available to assist with the procedure.  Anesthesia: General endotracheal anesthesia  Procedure Detail  RIGHT STEREOTACTIC BIOPSY OF INSULAR LESION, APPLICATION OF CRANIAL NAVIGATION  The patient was brought to the op room.  General anesthesia was induced and patient was intubated by the anesthesia service.  After appropriate lines and monitors were placed, patient positioned supine and his head was placed in a [place] head holder and affixed to the bed.  A preoperative thin cut MRI and CT angiogram was used to create a 3D reconstruction of his face and scalp which was used for registration.  A trajectory was planned through the middle frontal gyrus into the insular lesion with avoidance of sulci and vessels.  The planned entry point was marked on the scalp which was preprepped with alcohol and prepped and draped in sterile fashion.  A timeout was performed and preoperative antibiotics were administered.  The Brain[facility] variable guide was then locked into the appropriate  trajectory and a small stab incision was made with a 15 blade.  A reducing cannula for the power drill was placed and the drill was used to create a twist drill hole along the appropriate trajectory.  The dura was punctured.  The navigated biopsy needle was then passed to the lesion target, which was in the anterior portion of the insula.  Specimens were taken at numerous rotational locations at the target site.  The lesion was gelatinous in texture and diaphanous in appearance compared with normal brain tissue.  No significant bleeding was encountered with biopsy.  Frozen section was sent and returned as neoplastic lesional tissue.  The stereotactic needle was then withdrawn.  This stab incision was irrigated and then closed with 3-0 Vicryl Rapide followed by Dermabond.  Patient was then removed from [place] head holder and turned over to anesthesia with the expectation of extubation.  All counts were correct at the end of surgery.  No complications were noted.  Findings: Frozen section yielded lesional neoplastic tissue  Estimated Blood Loss:  less than 5 ml         Drains: none         Total IV Fluids: see anesthesia records  Blood Given: none          Specimens: right insular lesion         Implants: none        Complications:  * No complications entered in OR log *         Disposition: PACU - hemodynamically stable.         Condition: stable

## 2021-01-17 NOTE — Plan of Care (Signed)
  Problem: Education: Goal: Knowledge of General Education information will improve Description: Including pain rating scale, medication(s)/side effects and non-pharmacologic comfort measures Outcome: Progressing   Problem: Pain Managment: Goal: General experience of comfort will improve Outcome: Progressing   Problem: Safety: Goal: Ability to remain free from injury will improve Outcome: Progressing   

## 2021-01-18 ENCOUNTER — Encounter (HOSPITAL_COMMUNITY): Payer: Self-pay | Admitting: Neurosurgery

## 2021-01-18 LAB — GLUCOSE, CAPILLARY
Glucose-Capillary: 249 mg/dL — ABNORMAL HIGH (ref 70–99)
Glucose-Capillary: 256 mg/dL — ABNORMAL HIGH (ref 70–99)
Glucose-Capillary: 304 mg/dL — ABNORMAL HIGH (ref 70–99)

## 2021-01-18 MED ORDER — INSULIN ASPART 100 UNIT/ML IJ SOLN
0.0000 [IU] | Freq: Three times a day (TID) | INTRAMUSCULAR | Status: DC
  Administered 2021-01-18: 8 [IU] via SUBCUTANEOUS
  Administered 2021-01-18: 11 [IU] via SUBCUTANEOUS

## 2021-01-18 MED ORDER — ACETAMINOPHEN-CODEINE #3 300-30 MG PO TABS: 1.0000 | ORAL_TABLET | Freq: Four times a day (QID) | ORAL | 0 refills | Status: DC | PRN

## 2021-01-18 MED ORDER — ACETAMINOPHEN-CODEINE #3 300-30 MG PO TABS: 1.0000 | ORAL_TABLET | Freq: Four times a day (QID) | ORAL | Status: DC | PRN

## 2021-01-18 MED ORDER — DEXAMETHASONE 2 MG PO TABS: ORAL_TABLET | ORAL | 0 refills | Status: AC

## 2021-01-18 MED ORDER — DOCUSATE SODIUM 100 MG PO CAPS: 100.0000 mg | ORAL_CAPSULE | Freq: Two times a day (BID) | ORAL | 2 refills | Status: DC

## 2021-01-18 NOTE — Discharge Summary (Signed)
  Physician Discharge Summary  Patient ID: Alan Wise MRN: [id number] DOB/AGE: 30-Jul-1970 50 y.o.  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  He underwent stereotactic biopsy on 01/18/21.  Postoperatively, his neurologic exam was unchanged.  He continued to have mild headache that he had preoperatively.  A postop CT showed expected post-biopsy changes with no worrisome findings.  He was eating well, ambulating, and pain well-controlled and was deemed ready for d/c on 7/29.  Treatments: Surgery - stereotactic biopsy of right insular lesion  Discharge Exam: Blood pressure 111/66, pulse 73, temperature 98.2 F (36.8 C), temperature source Oral, resp. rate 16, height '5\' 8"'$  (1.727 m), weight 73.8 kg, SpO2 96 %. Awake, alert, oriented Speech fluent, appropriate CN grossly intact 5/5 BUE/BLE Wound c/d/i  Disposition: Discharge disposition: 01-Home or Self Care        Allergies as of 01/18/2021       Reactions   Contrast Media [iodinated Diagnostic Agents] Swelling        Medication List     TAKE these medications    acetaminophen-codeine 300-30 MG tablet Commonly known as: TYLENOL #3 Take 1-2 tablets by mouth every 6 (six) hours as needed for moderate pain or severe pain.   aspirin-acetaminophen-caffeine 250-250-65 MG tablet Commonly known as: EXCEDRIN MIGRAINE Take 2 tablets by mouth every 6 (six) hours as needed for headache.   atorvastatin 20 MG tablet Commonly known as: LIPITOR Take 1 tablet (20 mg total) by mouth daily.   dexamethasone 2 MG tablet Commonly known as: DECADRON Take 2 tablets (4 mg total) by mouth every 8 (eight) hours for 3 days,  THEN 1 tablet (2 mg total) every 8 (eight) hours for 3 days, THEN 1 tablet (2 mg total) 2 (two) times daily for 3 days, THEN 0.5 tablets (1 mg total) 2 (two) times daily for 3 days, THEN 0.5 tablets (1 mg total) daily for 3 days. Start taking on: January 19, 2021   docusate sodium 100 MG capsule Commonly known as: COLACE Take 1 capsule (100 mg total) by mouth 2 (two) times daily.   FREESTYLE LITE test strip Generic drug: glucose blood USE TWO TIMES DAILY TO TEST BLOOD SUGAR   levETIRAcetam 500 MG tablet Commonly known as: KEPPRA Take 1 tablet (500 mg total) by mouth 2 (two) times daily.   sitaGLIPtin 50 MG tablet Commonly known as: Januvia Take 1 tablet (50 mg total) by mouth daily.       ASK your doctor about these medications    glipiZIDE 10 MG tablet Commonly known as: GLUCOTROL TAKE 1 TABLET (10 MG TOTAL) BY MOUTH 2 (TWO) TIMES DAILY BEFORE A MEAL.   pantoprazole 40 MG tablet Commonly known as: Protonix Take 1 tablet (40 mg total) by mouth daily.        Follow-up Information     Vallarie Mare, MD Follow up in 2 week(s).   Specialty: Neurosurgery Contact information: [address] [place] [address] [phone]                 Signed: HASIB CHOATE 01/18/2021, 11:29 AM

## 2021-01-18 NOTE — Progress Notes (Signed)
Patient's blood sugar high at 317, does not have midnight coverage, Provider on call paged to notify, Received a call back. No new orders received at this time, Will continue to monitor patient

## 2021-01-18 NOTE — Discharge Instructions (Signed)
No strenuous activity for 2 weeks after surgery. Keep well-hydrated. May shower with baby shampoo starting 7/30.  Do not scrub wound, but okay for suds to get on wound. If surgical staples in place, these will be removed in clinic 10-14 days after surgery

## 2021-01-21 ENCOUNTER — Inpatient Hospital Stay: Payer: BC Managed Care – PPO | Attending: Neurosurgery

## 2021-01-21 DIAGNOSIS — Z7984 Long term (current) use of oral hypoglycemic drugs: Secondary | ICD-10-CM | POA: Insufficient documentation

## 2021-01-21 DIAGNOSIS — E119 Type 2 diabetes mellitus without complications: Secondary | ICD-10-CM | POA: Insufficient documentation

## 2021-01-21 DIAGNOSIS — R4586 Emotional lability: Secondary | ICD-10-CM | POA: Insufficient documentation

## 2021-01-21 DIAGNOSIS — E785 Hyperlipidemia, unspecified: Secondary | ICD-10-CM | POA: Insufficient documentation

## 2021-01-21 DIAGNOSIS — R569 Unspecified convulsions: Secondary | ICD-10-CM | POA: Insufficient documentation

## 2021-01-21 DIAGNOSIS — C711 Malignant neoplasm of frontal lobe: Secondary | ICD-10-CM | POA: Insufficient documentation

## 2021-01-21 DIAGNOSIS — K219 Gastro-esophageal reflux disease without esophagitis: Secondary | ICD-10-CM | POA: Insufficient documentation

## 2021-01-21 DIAGNOSIS — Z87442 Personal history of urinary calculi: Secondary | ICD-10-CM | POA: Insufficient documentation

## 2021-01-21 DIAGNOSIS — Z79899 Other long term (current) drug therapy: Secondary | ICD-10-CM | POA: Insufficient documentation

## 2021-01-23 ENCOUNTER — Other Ambulatory Visit: Payer: Self-pay | Admitting: Radiation Therapy

## 2021-01-24 ENCOUNTER — Other Ambulatory Visit: Payer: Self-pay

## 2021-01-24 ENCOUNTER — Encounter: Payer: Self-pay | Admitting: Internal Medicine

## 2021-01-24 ENCOUNTER — Ambulatory Visit (INDEPENDENT_AMBULATORY_CARE_PROVIDER_SITE_OTHER): Payer: BC Managed Care – PPO | Admitting: Internal Medicine

## 2021-01-24 VITALS — BP 131/83 | HR 69 | Temp 97.9°F | Resp 17 | Ht 68.0 in | Wt 159.2 lb

## 2021-01-24 DIAGNOSIS — C719 Malignant neoplasm of brain, unspecified: Secondary | ICD-10-CM | POA: Diagnosis not present

## 2021-01-24 DIAGNOSIS — F4323 Adjustment disorder with mixed anxiety and depressed mood: Secondary | ICD-10-CM

## 2021-01-24 DIAGNOSIS — R569 Unspecified convulsions: Secondary | ICD-10-CM

## 2021-01-24 NOTE — Progress Notes (Signed)
Subjective:    Patient ID: Alan Wise, male    DOB: 10/05/1970, 50 y.o.   MRN: [id number]  HPI  Pt presents to the clinic today to discuss anxiety and depression.  Glioma: Recently diagnosed. He had a biopsy 1 week ago, still waiting on results. He is taking Decadron and Keppra as prescribed. He reports anxiety around his diagnosis, and current workup. He has noticed in the post op period, he reports he is emotionally withdrawn, overemotional and intermittent agitation at times. He has never had issues with this in the past. He has never been treated for anxiety or depression in the past. He would like to avoid medication therapy at this time. He follows with neurology and is currently waiting for his pathology results to come back.  Review of Systems     Past Medical History:  Diagnosis Date   Allergy    Complication of anesthesia    pt reports he is starting to have more difficulty arousing after surgery   Diabetes mellitus ([place])    GERD (gastroesophageal reflux disease)    Headache    History of kidney stones    Hyperlipidemia    Renal disorder    kidney stones    Current Outpatient Medications  Medication Sig Dispense Refill   acetaminophen-codeine (TYLENOL #3) 300-30 MG tablet Take 1-2 tablets by mouth every 6 (six) hours as needed for moderate pain or severe pain. 40 tablet 0   aspirin-acetaminophen-caffeine (EXCEDRIN MIGRAINE) 250-250-65 MG tablet Take 2 tablets by mouth every 6 (six) hours as needed for headache.     atorvastatin (LIPITOR) 20 MG tablet Take 1 tablet (20 mg total) by mouth daily. 90 tablet 3   dexamethasone (DECADRON) 2 MG tablet Take 2 tablets (4 mg total) by mouth every 8 (eight) hours for 3 days, THEN 1 tablet (2 mg total) every 8 (eight) hours for 3 days, THEN 1 tablet (2 mg total) 2 (two) times daily for 3 days, THEN 0.5 tablets (1 mg total) 2 (two) times daily for 3 days, THEN 0.5 tablets (1 mg total) daily for 3 days. 37.5 tablet 0   docusate  sodium (COLACE) 100 MG capsule Take 1 capsule (100 mg total) by mouth 2 (two) times daily. 60 capsule 2   FREESTYLE LITE test strip USE TWO TIMES DAILY TO TEST BLOOD SUGAR 50 each 11   glipiZIDE (GLUCOTROL) 10 MG tablet TAKE 1 TABLET (10 MG TOTAL) BY MOUTH 2 (TWO) TIMES DAILY BEFORE A MEAL. (Patient taking differently: Take 10 mg by mouth 2 (two) times daily.) 180 tablet 0   levETIRAcetam (KEPPRA) 500 MG tablet Take 1 tablet (500 mg total) by mouth 2 (two) times daily. 60 tablet 3   pantoprazole (PROTONIX) 40 MG tablet Take 1 tablet (40 mg total) by mouth daily. (Patient not taking: Reported on 01/10/2021) 30 tablet 11   sitaGLIPtin (JANUVIA) 50 MG tablet Take 1 tablet (50 mg total) by mouth daily. 90 tablet 0   No current facility-administered medications for this visit.    Allergies  Allergen Reactions   Contrast Media [Iodinated Diagnostic Agents] Swelling    Family History  Problem Relation Age of Onset   Hyperlipidemia Father    Hypertension Father    Diabetes Father    Benign prostatic hyperplasia Father    Stroke Maternal Grandmother    Diabetes Maternal Grandmother    Stroke Paternal Grandmother    Hypertension Paternal Grandmother    Kidney disease Neg Hx    Prostate  cancer Neg Hx     Social History   Socioeconomic History   Marital status: Married    Spouse name: Not on file   Number of children: Not on file   Years of education: Not on file   Highest education level: Not on file  Occupational History   Not on file  Tobacco Use   Smoking status: Never   Smokeless tobacco: Never  Vaping Use   Vaping Use: Never used  Substance and Sexual Activity   Alcohol use: Yes    Alcohol/week: 0.0 standard drinks    Comment: rare   Drug use: No   Sexual activity: Not on file  Other Topics Concern   Not on file  Social History Narrative   Not on file   Social Determinants of Health   Financial Resource Strain: Not on file  Food Insecurity: Not on file   Transportation Needs: Not on file  Physical Activity: Not on file  Stress: Not on file  Social Connections: Not on file  Intimate Partner Violence: Not on file     Constitutional: Denies fever, malaise, fatigue, headache or abrupt weight changes.  Respiratory: Denies difficulty breathing, shortness of breath, cough or sputum production.   Cardiovascular: Denies chest pain, chest tightness, palpitations or swelling in the hands or feet.  Neurological: Patient reports focal seizures when agitated.  Denies dizziness, difficulty with memory, difficulty with speech or problems with balance and coordination.  Psych: Patient reports anxiety and depression.  Denies SI/HI.  No other specific complaints in a complete review of systems (except as listed in HPI above).  Objective:   Physical Exam   BP 131/83 (BP Location: Right Arm, Patient Position: Sitting, Cuff Size: Normal)   Pulse 69   Temp 97.9 F (36.6 C) (Temporal)   Resp 17   Ht '5\' 8"'$  (1.727 m)   Wt 159 lb 3.2 oz (72.2 kg)   SpO2 100%   BMI 24.21 kg/m   Wt Readings from Last 3 Encounters:  01/17/21 162 lb 11.2 oz (73.8 kg)  01/15/21 163 lb 8 oz (74.2 kg)  12/31/20 162 lb 14.4 oz (73.9 kg)    General: Appears his stated age, well developed, well nourished in NAD. HEENT: Head: normal shape and size; Eyes:  EOMs intact;  Cardiovascular: Normal rate. Pulmonary/Chest: Normal effort. Musculoskeletal: No difficulty with gait.  Neurological: Alert and oriented.  Psychiatric: Tearful at times.  Judgment and thought content normal.     BMET    Component Value Date/Time   NA 137 01/15/2021 1200   NA 138 05/04/2012 1815   K 4.3 01/15/2021 1200   K 3.5 05/04/2012 1815   CL 101 01/15/2021 1200   CL 99 05/04/2012 1815   CO2 30 01/15/2021 1200   CO2 29 05/04/2012 1815   GLUCOSE 137 (H) 01/15/2021 1200   GLUCOSE 69 05/04/2012 1815   BUN 7 01/15/2021 1200   BUN 12 05/04/2012 1815   CREATININE 0.93 01/15/2021 1200    CREATININE 0.79 12/05/2020 1014   CALCIUM 9.5 01/15/2021 1200   CALCIUM 9.2 05/04/2012 1815   GFRNONAA >60 01/15/2021 1200   GFRNONAA 106 12/05/2020 1014   GFRAA 123 12/05/2020 1014    Lipid Panel     Component Value Date/Time   CHOL 165 12/05/2020 1014   TRIG 214 (H) 12/05/2020 1014   HDL 32 (L) 12/05/2020 1014   CHOLHDL 5.2 (H) 12/05/2020 1014   VLDL 60.6 (H) 08/30/2020 1506   LDLCALC 100 (H) 12/05/2020 1014  CBC    Component Value Date/Time   WBC 8.1 01/15/2021 1200   RBC 5.43 01/15/2021 1200   HGB 15.9 01/15/2021 1200   HGB 16.4 05/04/2012 1815   HCT 45.0 01/15/2021 1200   HCT 48.4 05/04/2012 1815   PLT 234 01/15/2021 1200   PLT 261 05/04/2012 1815   MCV 82.9 01/15/2021 1200   MCV 86 05/04/2012 1815   MCH 29.3 01/15/2021 1200   MCHC 35.3 01/15/2021 1200   RDW 11.8 01/15/2021 1200   RDW 12.7 05/04/2012 1815   LYMPHSABS 2.4 01/18/2015 0713   MONOABS 0.5 01/18/2015 0713   EOSABS 0.2 01/18/2015 0713   BASOSABS 0.1 01/18/2015 0713    Hgb A1C Lab Results  Component Value Date   HGBA1C 9.1 (H) 12/05/2020           Assessment & Plan:   Webb Silversmith, NP This visit occurred during the SARS-CoV-2 public health emergency.  Safety protocols were in place, including screening questions prior to the visit, additional usage of staff PPE, and extensive cleaning of exam room while observing appropriate contact time as indicated for disinfecting solutions.

## 2021-01-25 ENCOUNTER — Encounter (HOSPITAL_COMMUNITY): Payer: Self-pay

## 2021-01-25 DIAGNOSIS — F4323 Adjustment disorder with mixed anxiety and depressed mood: Secondary | ICD-10-CM | POA: Insufficient documentation

## 2021-01-25 NOTE — Assessment & Plan Note (Signed)
He is not interested in medication or referral to therapy at this time Encouraged him to take time for himself and isolate as needed when agitated Encourage stress reducing techniques such as deep breathing, meditation Encouraged him to do things that he enjoys doing Support offered today

## 2021-01-25 NOTE — Patient Instructions (Signed)

## 2021-01-25 NOTE — Assessment & Plan Note (Signed)
Secondary to glioma Improved on Keppra He will continue to follow with neurology

## 2021-01-25 NOTE — Assessment & Plan Note (Addendum)
Currently awaiting pathology results Continue Dexamethasone Referral to neurooncology for second opinion at [facility] per his request

## 2021-01-28 ENCOUNTER — Inpatient Hospital Stay (HOSPITAL_BASED_OUTPATIENT_CLINIC_OR_DEPARTMENT_OTHER): Payer: BC Managed Care – PPO | Admitting: Internal Medicine

## 2021-01-28 ENCOUNTER — Ambulatory Visit: Payer: BC Managed Care – PPO | Admitting: Internal Medicine

## 2021-01-28 ENCOUNTER — Inpatient Hospital Stay: Payer: BC Managed Care – PPO

## 2021-01-28 ENCOUNTER — Other Ambulatory Visit: Payer: Self-pay

## 2021-01-28 VITALS — BP 126/87 | HR 76 | Temp 98.7°F | Resp 20 | Wt 159.7 lb

## 2021-01-28 DIAGNOSIS — K219 Gastro-esophageal reflux disease without esophagitis: Secondary | ICD-10-CM | POA: Diagnosis not present

## 2021-01-28 DIAGNOSIS — Z79899 Other long term (current) drug therapy: Secondary | ICD-10-CM | POA: Diagnosis not present

## 2021-01-28 DIAGNOSIS — R569 Unspecified convulsions: Secondary | ICD-10-CM

## 2021-01-28 DIAGNOSIS — C719 Malignant neoplasm of brain, unspecified: Secondary | ICD-10-CM | POA: Diagnosis not present

## 2021-01-28 DIAGNOSIS — C711 Malignant neoplasm of frontal lobe: Secondary | ICD-10-CM | POA: Diagnosis present

## 2021-01-28 DIAGNOSIS — E785 Hyperlipidemia, unspecified: Secondary | ICD-10-CM | POA: Diagnosis not present

## 2021-01-28 DIAGNOSIS — E119 Type 2 diabetes mellitus without complications: Secondary | ICD-10-CM | POA: Diagnosis not present

## 2021-01-28 DIAGNOSIS — Z7984 Long term (current) use of oral hypoglycemic drugs: Secondary | ICD-10-CM | POA: Diagnosis not present

## 2021-01-28 DIAGNOSIS — R4586 Emotional lability: Secondary | ICD-10-CM | POA: Diagnosis not present

## 2021-01-28 DIAGNOSIS — Z87442 Personal history of urinary calculi: Secondary | ICD-10-CM | POA: Diagnosis not present

## 2021-01-28 NOTE — Progress Notes (Signed)
[facility] [place], [place] [address] [phone]  Interval Evaluation  Date of Service: 01/28/21 Patient Name: Alan Wise Patient MRN: [id number] Patient DOB: 1970-12-10 Provider: Ventura Sellers, MD  Identifying Statement:  Alan Wise is a 50 y.o. male with R frontal oligodendroglioma   Oncology History  Oligodendroglioma ([place])  01/17/2021 Surgery   Stereotactic biopsy with Dr. Marcello Moores; path demonstrates IDH-1 mutant oligodendroglioma     Biomarkers:  MGMT Unknown.  IDH 1/2 Mutated.  1p/19q Unknown  TERT Unknown   Interval History: Alan Wise presents today for follow up after recent stereotactic biopsy with Dr. Marcello Moores on 01/17/21.  He tolerated surgery well without complication aside from vocal chord issues from anesthesia.  He does describe some increase in irritability and mood lability since starting the steroids, although the taper is ongoing.  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  H+P (12/31/20) Patient presented to medical attention this past month, with complaint of involuntary left sided shaking.  He describes episodes, occurring several times per week, of left arm shaking, lasting up to 5 minutes; following the episodes he describes the arm as "heavy" for some time before returning to normal.  He also describes episodes of sudden inability to speak, with strange movements of face and hands, lasting same amount of time.  These episodes go back ~10 years in very sporadic nature, but have become much more frequent in recent months.  He works full time as a Health and safety inspector at [employer].  These episodes have occurred at work, and have been disruptive in that environment.  He is otherwise fully functional, independent.  Medications: Current Outpatient Medications on File Prior to Visit  Medication Sig Dispense Refill   atorvastatin (LIPITOR) 20 MG tablet Take  1 tablet (20 mg total) by mouth daily. 90 tablet 3   dexamethasone (DECADRON) 2 MG tablet Take 2 tablets (4 mg total) by mouth every 8 (eight) hours for 3 days, THEN 1 tablet (2 mg total) every 8 (eight) hours for 3 days, THEN 1 tablet (2 mg total) 2 (two) times daily for 3 days, THEN 0.5 tablets (1 mg total) 2 (two) times daily for 3 days, THEN 0.5 tablets (1 mg total) daily for 3 days. 37.5 tablet 0   glipiZIDE (GLUCOTROL) 10 MG tablet TAKE 1 TABLET (10 MG TOTAL) BY MOUTH 2 (TWO) TIMES DAILY BEFORE A MEAL. (Patient taking differently: Take 10 mg by mouth 2 (two) times daily.) 180 tablet 0   levETIRAcetam (KEPPRA) 500 MG tablet Take 1 tablet (500 mg total) by mouth 2 (two) times daily. 60 tablet 3   sitaGLIPtin (JANUVIA) 50 MG tablet Take 1 tablet (50 mg total) by mouth daily. 90 tablet 0   docusate sodium (COLACE) 100 MG capsule Take 1 capsule (100 mg total) by mouth 2 (two) times daily. (Patient not taking: Reported on 01/28/2021) 60 capsule 2   FREESTYLE LITE test strip USE TWO TIMES DAILY TO TEST BLOOD SUGAR (Patient not taking: Reported on 01/28/2021) 50 each 11   No current facility-administered medications on file prior to visit.    Allergies:  Allergies  Allergen Reactions   Contrast Media [Iodinated Diagnostic Agents] Swelling   Past Medical History:  Past Medical History:  Diagnosis Date   Allergy    Complication of anesthesia    pt reports he is starting to have more difficulty arousing after surgery   Diabetes mellitus ([place])  GERD (gastroesophageal reflux disease)    Headache    History of kidney stones    Hyperlipidemia    Renal disorder    kidney stones   Past Surgical History:  Past Surgical History:  Procedure Laterality Date   APPLICATION OF CRANIAL NAVIGATION Right 01/17/2021   Procedure: APPLICATION OF CRANIAL NAVIGATION;  Surgeon: Vallarie Mare, MD;  Location: [facility];  Service: Neurosurgery;  Laterality: Right;   COLONOSCOPY  09/03/1994   CYSTOSCOPY WITH  STENT PLACEMENT Right 01/25/2016   Procedure: CYSTOSCOPY WITH STENT PLACEMENT;  Surgeon: Nickie Retort, MD;  Location: [facility];  Service: Urology;  Laterality: Right;   FRAMELESS  BIOPSY WITH BRAINLAB Right 01/17/2021   Procedure: RIGHT STEREOTACTIC BIOPSY OF INSULAR LESION;  Surgeon: Vallarie Mare, MD;  Location: [facility];  Service: Neurosurgery;  Laterality: Right;   KNEE ARTHROSCOPY W/ MENISCAL REPAIR Right 06/23/1988   removal of birthmark  06/08/2013   SKIN CANCER EXCISION  06/23/2012   TYMPANOSTOMY TUBE PLACEMENT  08/06/1978   URETEROSCOPY WITH HOLMIUM LASER LITHOTRIPSY Right 01/25/2016   Procedure: URETEROSCOPY WITH HOLMIUM LASER LITHOTRIPSY;  Surgeon: Nickie Retort, MD;  Location: [facility];  Service: Urology;  Laterality: Right;   URETHRAL STRICTURE DILATATION     visual inspection of vocal cord     1973   WISDOM TOOTH EXTRACTION     Social History:  Social History   Socioeconomic History   Marital status: Married    Spouse name: Not on file   Number of children: Not on file   Years of education: Not on file   Highest education level: Not on file  Occupational History   Not on file  Tobacco Use   Smoking status: Never   Smokeless tobacco: Never  Vaping Use   Vaping Use: Never used  Substance and Sexual Activity   Alcohol use: Yes    Alcohol/week: 0.0 standard drinks    Comment: rare   Drug use: No   Sexual activity: Not on file  Other Topics Concern   Not on file  Social History Narrative   Not on file   Social Determinants of Health   Financial Resource Strain: Not on file  Food Insecurity: Not on file  Transportation Needs: Not on file  Physical Activity: Not on file  Stress: Not on file  Social Connections: Not on file  Intimate Partner Violence: Not on file   Family History:  Family History  Problem Relation Age of Onset   Hyperlipidemia Father    Hypertension Father    Diabetes Father    Benign prostatic hyperplasia Father    Stroke  Maternal Grandmother    Diabetes Maternal Grandmother    Stroke Paternal Grandmother    Hypertension Paternal Grandmother    Kidney disease Neg Hx    Prostate cancer Neg Hx     Review of Systems: Constitutional: Doesn't report fevers, chills or abnormal weight loss Eyes: Doesn't report blurriness of vision Ears, nose, mouth, throat, and face: Doesn't report sore throat Respiratory: Doesn't report cough, dyspnea or wheezes Cardiovascular: Doesn't report palpitation, chest discomfort  Gastrointestinal:  Doesn't report nausea, constipation, diarrhea GU: Doesn't report incontinence Skin: Doesn't report skin rashes Neurological: Per HPI Musculoskeletal: Doesn't report joint pain Behavioral/Psych: Doesn't report anxiety  Physical Exam: Vitals:   01/28/21 1402  BP: 126/87  Pulse: 76  Resp: 20  Temp: 98.7 F (37.1 C)  SpO2: 98%   KPS: 90. General: Alert, cooperative, pleasant, in no acute distress Head: Normal  EENT: No conjunctival injection or scleral icterus.  Lungs: Resp effort normal Cardiac: Regular rate Abdomen: Non-distended abdomen Skin: No rashes cyanosis or petechiae. Extremities: No clubbing or edema  Neurologic Exam: Mental Status: Awake, alert, attentive to examiner. Oriented to self and environment. Language is fluent with intact comprehension.  Cranial Nerves: Visual acuity is grossly normal. Visual fields are full. Extra-ocular movements intact. No ptosis. Face is symmetric Motor: Tone and bulk are normal. Power is full in both arms and legs. Reflexes are symmetric, no pathologic reflexes present.  Sensory: Intact to light touch Gait: Normal.   Labs: I have reviewed the data as listed    Component Value Date/Time   NA 137 01/15/2021 1200   NA 138 05/04/2012 1815   K 4.3 01/15/2021 1200   K 3.5 05/04/2012 1815   CL 101 01/15/2021 1200   CL 99 05/04/2012 1815   CO2 30 01/15/2021 1200   CO2 29 05/04/2012 1815   GLUCOSE 137 (H) 01/15/2021 1200    GLUCOSE 69 05/04/2012 1815   BUN 7 01/15/2021 1200   BUN 12 05/04/2012 1815   CREATININE 0.93 01/15/2021 1200   CREATININE 0.79 12/05/2020 1014   CALCIUM 9.5 01/15/2021 1200   CALCIUM 9.2 05/04/2012 1815   PROT 7.0 12/05/2020 1014   PROT 8.5 (H) 05/04/2012 1815   ALBUMIN 4.5 08/30/2020 1506   ALBUMIN 4.5 05/04/2012 1815   AST 20 12/05/2020 1014   AST 39 (H) 05/04/2012 1815   ALT 43 12/05/2020 1014   ALT 77 05/04/2012 1815   ALKPHOS 108 08/30/2020 1506   ALKPHOS 129 05/04/2012 1815   BILITOT 0.5 12/05/2020 1014   BILITOT 0.4 05/04/2012 1815   GFRNONAA >60 01/15/2021 1200   GFRNONAA 106 12/05/2020 1014   GFRAA 123 12/05/2020 1014   Lab Results  Component Value Date   WBC 8.1 01/15/2021   NEUTROABS 4.6 01/18/2015   HGB 15.9 01/15/2021   HCT 45.0 01/15/2021   MCV 82.9 01/15/2021   PLT 234 01/15/2021    Imaging:  CT ANGIO HEAD W OR WO CONTRAST  Result Date: 01/16/2021 CLINICAL DATA:  Glioma of central nervous system.  Biopsy planning. EXAM: CT ANGIOGRAPHY HEAD TECHNIQUE: Multidetector CT imaging of the head was performed using the standard protocol during bolus administration of intravenous contrast. Multiplanar CT image reconstructions and MIPs were obtained to evaluate the vascular anatomy. CONTRAST:  68m OMNIPAQUE IOHEXOL 350 MG/ML SOLN COMPARISON:  Brain MRI 12/21/2020 FINDINGS: CT HEAD Brain: Known infiltrating low-density in the right insula and adjacent frontal lobe by MRI. No calcification or hemorrhage seen by CT. No hydrocephalus or generalized mass effect. Vascular: See below Skull: No acute finding Sinuses: Negative CTA HEAD Anterior circulation: Mild calcification at the carotid siphons. No flow limiting stenosis, aneurysm, or signs of vascular malformation. Right M2 branches are roughly divided by the infiltrating mass. Posterior circulation: The vertebral and basilar arteries are smooth and diffusely patent. No branch occlusion, beading, or aneurysm. Venous sinuses:  Diffusely patent. No large cortical vein at the level of the mass. Anatomic variants: None significant Review of the MIP images confirms the above findings. IMPRESSION: Treatment planning CTA showing no unexpected finding. Electronically Signed   By: JMonte FantasiaM.D.   On: 01/16/2021 16:10   CT HEAD WO CONTRAST  Result Date: 01/17/2021 CLINICAL DATA:  Brain/CNS neoplasm, assess treatment response; s/p stereotactic biopsy. EXAM: CT HEAD WITHOUT CONTRAST TECHNIQUE: Contiguous axial images were obtained from the base of the skull through the vertex without intravenous contrast.  COMPARISON:  Head CTA 01/15/2021.  Head MRI 12/21/2020. FINDINGS: Brain: The infiltrative hypodense mass involving the right insula, frontal operculum, and temporal lobe is unchanged in extent compared to the recent CTA. There is a small locule of gas in the anterior aspect of the lesion related to interval biopsy with a small amount of adjacent acute petechial hemorrhage. Mild regional mass effect is unchanged, and there is no midline shift. A small amount of pneumocephalus is also noted anteriorly over the right frontal convexity. There is no extra-axial fluid collection. Vascular: Calcified atherosclerosis at the skull base. Skull: Right frontal burr hole. Sinuses/Orbits: Minimal right ethmoid air cell mucosal thickening. Clear mastoid air cells. Unremarkable orbits. Other: None. IMPRESSION: Postoperative changes from interval right insular mass biopsy. Electronically Signed   By: Logan Bores M.D.   On: 01/17/2021 19:42    Pathology: SURGICAL PATHOLOGY  CASE: [id number]  PATIENT: Alan Wise  Surgical Pathology Report   Clinical History: glioma of central nervous system (cm)   FINAL MICROSCOPIC DIAGNOSIS:   A. BRAIN TUMOR, RIGHT INSULAR, BIOPSY:  - Infiltrating glioma, IDH-mutant with oligodendroglial features, see  comment   B. BRAIN TUMOR, RIGHT INSULAR, EXCISION:  - Infiltrating glioma, IDH-mutant with  oligodendroglial features, see  comment   COMMENT:   A and B.   This case was sent in consultation to Dr. Moody Bruins at [facility], [place], [place].  The case is pending additional  molecular studies at the outside institution for optimal WHO grade  classification.  A copy of the outside pathology report is available in  patient's electronic medical records.   INTRAOPERATIVE DIAGNOSIS:   A1. BRAIN TUMOR, RIGHT INSULAR, FROZEN SECTION:         Lesional tissue present.         Rapid intraoperative consult diagnosis rendered by Dr.Kashikar @  1257 01/17/2021.    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  Specimen is consistent with low or intermediate grade glioma, without histologic grade at this time.  IDH-1 status and oligodendroglial histotype are favorable markers; 1p/19q codeletion status is still pending.    We had an extensive conversation with him and his wife regarding pathology, prognosis, and available treatment pathways.    Because of age >69, less than gross total resection, we ultimately recommended proceeding with course of intensity modulated radiation therapy and concurrent daily Temozolomide.  Radiation will be administered Mon-Fri over 6 weeks, Temodar will be dosed at 81m/m2 to be given daily over 42 days.  We reviewed side effects of temodar, including fatigue, nausea/vomiting, constipation, and cytopenias.  Informed consent was verbally obtained at bedside to proceed with oral chemotherapy.  Chemotherapy should be held for the following:  ANC less than 1,000  Platelets less than 100,000  LFT or creatinine greater than 2x ULN  If clinical concerns/contraindications develop  Every 2 weeks during radiation, labs will be checked accompanied by a clinical evaluation in the brain tumor clinic.    For now will con't decadron taper and con't keppra.  If mood  lability is still present, will consider changing AED therapy.  All questions were answered. The patient knows to call the clinic with any problems, questions or concerns. No barriers to learning were detected.  The total time spent in the encounter was 40 minutes and more than 50% was on counseling and review of test results   ZVentura Sellers MD Medical Director of Neuro-[facility] 01/28/21 2:26 PM

## 2021-01-29 ENCOUNTER — Telehealth: Payer: Self-pay | Admitting: Radiation Oncology

## 2021-01-29 ENCOUNTER — Other Ambulatory Visit: Payer: Self-pay | Admitting: Radiation Therapy

## 2021-01-29 DIAGNOSIS — C719 Malignant neoplasm of brain, unspecified: Secondary | ICD-10-CM

## 2021-01-29 NOTE — Telephone Encounter (Signed)
Called patient to schedule consultation with Dr. Isidore Moos. No answer, VM full.

## 2021-02-04 NOTE — Progress Notes (Signed)
Radiation Oncology         [phone] ________________________________  Initial Outpatient Consultation  Name: Alan Wise MRN: [id number]  Date: 02/05/2021  DOB: 04/19/1971  OE:UMPNT, Coralie Keens, NP  Mickeal Skinner Acey Lav, MD   REFERRING PHYSICIAN: Ventura Sellers, MD  DIAGNOSIS:    ICD-10-CM   1. 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 male who presented with involuntary left sided shaking to his PCP this past June/July. He describes the episodes as occurring several times per week, with left arm shaking lasting up to 5 minutes. Following these episodes, he describes the arm as "heavy" for some time before returning to normal.  He also describes episodes of sudden inability to speak, with strange movements of face and hands lasting for the same interval of time.  These episodes go back 10 years and are very sporadic in nature and have become much more frequent in recent months. Patient is a full time professor of mathematics at [employer], and states that these episodes have occurred at work and have been disruptive. He is otherwise fully functional and independent.  He also had episodes of falling a couple months ago.  MRI of the brain taken on 12/21/20 demonstrated a 5.1 x 3.5 x 4.5 cm expansile region of T2/FLAIR hyperintense signal abnormality within the right frontal operculum, corona radiata, right insula, subinsular white matter, inferolateral right frontal lobe, and anterior right temporal lobe. Vascular enhancement was additionally noted to this site, without definite abnormal parenchymal enhancement. Overall, findings were most suggestive of a glioma; favored to be low-grade.  I  personally reviewed his imaging  Subsequently, the patient was referred by his PCP to Dr. Mickeal Skinner on 12/31/20 for further evaluation. Per the visit note; Dr. Mickeal Skinner stated the patients recent clinical and radiographic findings to be consistent with focal epilepsy, secondary to right insular/temporal primary neoplasm. Suspected histology noted to be low or intermediate grade glioma. Referral to neurosurgery was accordingly placed.  Patient reports that his symptomatic "episodes" ceased right after starting Keppra  The patient opted to proceed with right insular biopsy of lesion on 01/17/21. Pathology from the procedure revealed: infiltrating glioma, IDH-mutant with oligodendroglial features. Postoperative head CT taken on this same date showed normal postoperative changes  Of note: the patient presented to Dr. Garnette Gunner [facility]) with reports of anxiety regarding his Dx, feeling emotionally withdrawn, overemotional, and agitated as of late. Patient stated that he has never had these feelings in the past.  However he has felt emotionally better since finishing his Decadron taper postoperatively  The patient most recently followed up with Dr. Mickeal Skinner on 01/28/21. Patient was noted to have tolerated recent surgery well without complications aside from vocal cord issues from anesthesia.     Recent neurologic symptoms, if any:   Headaches: Reports occasional discomfort along left side/temporal aspect of head. Started after an old head injury ("elbow to head"). Usually mild, occurs in the evenings, and only occasionally requires OTC pain medication Nausea: Denies nausea, but does report he had episodes of altered sense of taste/appetite after biopsy Dizziness/ataxia: Patient denies (especially since starting [place]) Difficulty with hand coordination: Patient denies Focal numbness/weakness: Denies,  but does get physically fatigued faster (ex. Caring for 67 year-old son, he has to rest more  frequently) Visual deficits/changes: Overall, patient denies. He does report occasionally having "vertical double vision" when his eyes are fatigued Confusion/Memory deficits: Patient denies since starting Keppra (wife notes irritablity/trouble comprehending when on steroids)  In 2 weeks he will see Dr. Tommi Rumps at [facility] for second opinion regarding neurosurgery and resectability.   PREVIOUS RADIATION THERAPY: No  PAST MEDICAL HISTORY:  has a past medical history of Allergy, Complication of anesthesia, Diabetes mellitus ([place]), GERD (gastroesophageal reflux disease), Headache, History of kidney stones, Hyperlipidemia, and Renal disorder.    PAST SURGICAL HISTORY: Past Surgical History:  Procedure Laterality Date   APPLICATION OF CRANIAL NAVIGATION Right 01/17/2021   Procedure: APPLICATION OF CRANIAL NAVIGATION;  Surgeon: Vallarie Mare, MD;  Location: [place];  Service: Neurosurgery;  Laterality: Right;   COLONOSCOPY  09/03/1994   CYSTOSCOPY WITH STENT PLACEMENT Right 01/25/2016   Procedure: CYSTOSCOPY WITH STENT PLACEMENT;  Surgeon: Nickie Retort, MD;  Location: [facility];  Service: Urology;  Laterality: Right;   FRAMELESS  BIOPSY WITH BRAINLAB Right 01/17/2021   Procedure: RIGHT STEREOTACTIC BIOPSY OF INSULAR LESION;  Surgeon: Vallarie Mare, MD;  Location: [place];  Service: Neurosurgery;  Laterality: Right;   KNEE ARTHROSCOPY W/ MENISCAL REPAIR Right 06/23/1988   removal of birthmark  06/08/2013   SKIN CANCER EXCISION  06/23/2012   TYMPANOSTOMY TUBE PLACEMENT  08/06/1978   URETEROSCOPY WITH HOLMIUM LASER LITHOTRIPSY Right 01/25/2016   Procedure: URETEROSCOPY WITH HOLMIUM LASER LITHOTRIPSY;  Surgeon: Nickie Retort, MD;  Location: [facility];  Service: Urology;  Laterality: Right;   URETHRAL STRICTURE DILATATION     visual inspection of vocal cord     1973   WISDOM TOOTH EXTRACTION      FAMILY HISTORY: family history includes Benign prostatic hyperplasia in his father;  Diabetes in his father and maternal grandmother; Hyperlipidemia in his father; Hypertension in his father and paternal grandmother; Stroke in his maternal grandmother and paternal grandmother.  SOCIAL HISTORY:  reports that he has never smoked. He has never used smokeless tobacco. He reports that he does not currently use alcohol. He reports that he does not use drugs.  ALLERGIES: Contrast media [iodinated diagnostic agents]  MEDICATIONS:  Current Outpatient Medications  Medication Sig Dispense Refill   atorvastatin (LIPITOR) 20 MG tablet Take 1 tablet (20 mg total) by mouth daily. 90 tablet 3   docusate sodium (COLACE) 100 MG capsule Take 1 capsule (100 mg total) by mouth 2 (two) times daily. (Patient not taking: Reported on 01/28/2021) 60 capsule 2   FREESTYLE LITE test strip USE TWO TIMES DAILY TO TEST BLOOD SUGAR (Patient not taking: Reported on 01/28/2021) 50 each 11   glipiZIDE (GLUCOTROL) 10 MG tablet TAKE 1 TABLET (10 MG TOTAL) BY MOUTH 2 (TWO) TIMES DAILY BEFORE A MEAL. (Patient taking differently: Take 10 mg by mouth 2 (two) times daily.) 180 tablet 0   levETIRAcetam (KEPPRA) 500 MG tablet Take 1 tablet (500 mg total) by mouth 2 (two) times daily. 60 tablet 3   sitaGLIPtin (JANUVIA) 50 MG tablet Take 1 tablet (50 mg total) by mouth daily. 90 tablet 0   No current facility-administered medications for this encounter.    REVIEW OF SYSTEMS:  Notable for that above.   PHYSICAL EXAM:  height is 5' 8" (1.727 m) and weight is 159 lb (72.1 kg). His temporal temperature is 97.2 F (36.2 C) (abnormal). His blood pressure  is 126/81 and his pulse is 83. His respiration is 18 and oxygen saturation is 97%.   General: Alert and oriented, in no acute distress  HEENT: Head is normocephalic. Extraocular movements are intact. Oropharynx is clear.  No thrush Neck: Neck is supple, no palpable cervical or supraclavicular lymphadenopathy. Heart: Regular in rate and rhythm with no murmurs, rubs, or  gallops. Chest: Clear to auscultation bilaterally, with no rhonchi, wheezes, or rales. Abdomen: Soft, nontender, nondistended, with no rigidity or guarding. Extremities: No cyanosis or edema. Lymphatics: see Neck Exam Skin: No concerning lesions. Musculoskeletal: symmetric strength and muscle tone throughout. Neurologic: Ambulatory.  Cranial nerves II through XII are grossly intact. No obvious focalities. Speech is fluent. Coordination is intact.  Object recall 3 out of 3 at 3 minutes Psychiatric: Judgment and insight are intact. Affect is appropriate.   KPS = 90  100 - Normal; no complaints; no evidence of disease. 90   - Able to carry on normal activity; minor signs or symptoms of disease. 80   - Normal activity with effort; some signs or symptoms of disease. 49   - Cares for self; unable to carry on normal activity or to do active work. 60   - Requires occasional assistance, but is able to care for most of his personal needs. 50   - Requires considerable assistance and frequent medical care. 53   - Disabled; requires special care and assistance. 90   - Severely disabled; hospital admission is indicated although death not imminent. 41   - Very sick; hospital admission necessary; active supportive treatment necessary. 10   - Moribund; fatal processes progressing rapidly. 0     - Dead  Karnofsky DA, Abelmann [place], [place] and [place] [phone]) The use of the nitrogen mustards in the palliative treatment of carcinoma: with particular reference to bronchogenic carcinoma Cancer 1 [id number]  LABORATORY DATA:  Lab Results  Component Value Date   WBC 8.1 01/15/2021   HGB 15.9 01/15/2021   HCT 45.0 01/15/2021   MCV 82.9 01/15/2021   PLT 234 01/15/2021   CMP     Component Value Date/Time   NA 137 01/15/2021 1200   NA 138 05/04/2012 1815   K 4.3 01/15/2021 1200   K 3.5 05/04/2012 1815   CL 101 01/15/2021 1200   CL 99 05/04/2012 1815   CO2 30 01/15/2021 1200   CO2 29 05/04/2012  1815   GLUCOSE 137 (H) 01/15/2021 1200   GLUCOSE 69 05/04/2012 1815   BUN 7 01/15/2021 1200   BUN 12 05/04/2012 1815   CREATININE 0.93 01/15/2021 1200   CREATININE 0.79 12/05/2020 1014   CALCIUM 9.5 01/15/2021 1200   CALCIUM 9.2 05/04/2012 1815   PROT 7.0 12/05/2020 1014   PROT 8.5 (H) 05/04/2012 1815   ALBUMIN 4.5 08/30/2020 1506   ALBUMIN 4.5 05/04/2012 1815   AST 20 12/05/2020 1014   AST 39 (H) 05/04/2012 1815   ALT 43 12/05/2020 1014   ALT 77 05/04/2012 1815   ALKPHOS 108 08/30/2020 1506   ALKPHOS 129 05/04/2012 1815   BILITOT 0.5 12/05/2020 1014   BILITOT 0.4 05/04/2012 1815   GFRNONAA >60 01/15/2021 1200   GFRNONAA 106 12/05/2020 1014   GFRAA 123 12/05/2020 1014         RADIOGRAPHY: CT ANGIO HEAD W OR WO CONTRAST  Result Date: 01/16/2021 CLINICAL DATA:  Glioma of central nervous system.  Biopsy planning. EXAM: CT ANGIOGRAPHY HEAD TECHNIQUE: Multidetector CT imaging of the head was performed using  the standard protocol during bolus administration of intravenous contrast. Multiplanar CT image reconstructions and MIPs were obtained to evaluate the vascular anatomy. CONTRAST:  81m OMNIPAQUE IOHEXOL 350 MG/ML SOLN COMPARISON:  Brain MRI 12/21/2020 FINDINGS: CT HEAD Brain: Known infiltrating low-density in the right insula and adjacent frontal lobe by MRI. No calcification or hemorrhage seen by CT. No hydrocephalus or generalized mass effect. Vascular: See below Skull: No acute finding Sinuses: Negative CTA HEAD Anterior circulation: Mild calcification at the carotid siphons. No flow limiting stenosis, aneurysm, or signs of vascular malformation. Right M2 branches are roughly divided by the infiltrating mass. Posterior circulation: The vertebral and basilar arteries are smooth and diffusely patent. No branch occlusion, beading, or aneurysm. Venous sinuses: Diffusely patent. No large cortical vein at the level of the mass. Anatomic variants: None significant Review of the MIP images  confirms the above findings. IMPRESSION: Treatment planning CTA showing no unexpected finding. Electronically Signed   By: JMonte FantasiaM.D.   On: 01/16/2021 16:10   CT HEAD WO CONTRAST  Result Date: 01/17/2021 CLINICAL DATA:  Brain/CNS neoplasm, assess treatment response; s/p stereotactic biopsy. EXAM: CT HEAD WITHOUT CONTRAST TECHNIQUE: Contiguous axial images were obtained from the base of the skull through the vertex without intravenous contrast. COMPARISON:  Head CTA 01/15/2021.  Head MRI 12/21/2020. FINDINGS: Brain: The infiltrative hypodense mass involving the right insula, frontal operculum, and temporal lobe is unchanged in extent compared to the recent CTA. There is a small locule of gas in the anterior aspect of the lesion related to interval biopsy with a small amount of adjacent acute petechial hemorrhage. Mild regional mass effect is unchanged, and there is no midline shift. A small amount of pneumocephalus is also noted anteriorly over the right frontal convexity. There is no extra-axial fluid collection. Vascular: Calcified atherosclerosis at the skull base. Skull: Right frontal burr hole. Sinuses/Orbits: Minimal right ethmoid air cell mucosal thickening. Clear mastoid air cells. Unremarkable orbits. Other: None. IMPRESSION: Postoperative changes from interval right insular mass biopsy. Electronically Signed   By: ALogan BoresM.D.   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  Today, I talked to the patient about the findings and work-up thus far.  We discussed the patient's diagnosis of oligodendroglioma and general treatment for this, highlighting the role of radiotherapy in the management.  We discussed the available radiation techniques, and focused on the details of logistics and delivery.   We discussed the  [facility](including Varian True Beam linear accelerators) and IMRT techniques that we employ to deliver the optimal dose of radiation therapy to the tumor while sparing normal brain tissue from excessive dose.  Given his age and the likelihood that this will not be grossly resected, I recommend 6 weeks of radiation therapy to the partial brain with concurrent Temodar.  We discussed the data which has demonstrated improvement in progression free survival for patients with high risk oligodendroglioma that receive radiation as part of their treatment algorithm.  We discussed the risks, benefits, and side effects of radiotherapy. Side effects may include but not necessarily be limited to: Skin irritation, hair loss, fatigue, headache, nausea, injury to the brain and possible neurological / cognitive decline. No guarantees of treatment were given. A consent form was signed and placed in the patient's medical record. The patient was encouraged  to ask questions that I answered to the best of my ability.  He is enthusiastic to proceed with treatment.  He will get in touch with [place] after his second opinion at [facility] neurosurgery I gave him the contact information of our patient navigator.  He and his wife were profoundly grateful for our meeting today.   On date of service, in total, I spent 65 minutes on this encounter. Patient was seen in person.   __________________________________________   Eppie Gibson, MD  This document serves as a record of services personally performed by Eppie Gibson, MD. It was created on her behalf by Roney Mans, a trained medical scribe. The creation of this record is based on the scribe's personal observations and the provider's statements to them. This document has been checked and approved by the attending provider.

## 2021-02-04 NOTE — Progress Notes (Signed)
Location/Histology of Brain Tumor:  Oligodendroglioma  Patient presented with symptoms of:  complaint of involuntary left sided shaking.  He describes episodes, occurring several times per week, of left arm shaking, lasting up to 5 minutes; following the episodes he describes the arm as "heavy" for some time before returning to normal.  He also describes episodes of sudden inability to speak, with strange movements of face and hands, lasting same amount of time.  These episodes go back ~10 years in very sporadic nature, but have become much more frequent in recent months  MRI Brain w/ & w/o Contrast 12/21/2020 --IMPRESSION: 5.1 x 3.5 x 4.5 cm expansile region of T2/FLAIR hyperintense signal abnormality within the right frontal operculum and corona radiata, right insula and subinsular white matter, as well as inferolateral right frontal lobe and anterior right temporal lobe. There is vascular enhancement at this site, without definite abnormal parenchymal enhancement. The imaging features are most suggestive of a glioma (favor low-grade given the lack of definite abnormal parenchymal enhancement). Neuro-Oncology consultation is recommended. Local mass effect without significant ventricular effacement or midline shift.  Biopsies revealed  01/17/2021 A. BRAIN TUMOR, RIGHT INSULAR, BIOPSY:  - Infiltrating glioma, IDH-mutant with oligodendroglial features, see comment  B. BRAIN TUMOR, RIGHT INSULAR, EXCISION:  - Infiltrating glioma, IDH-mutant with oligodendroglial features, see comment   Past or anticipated interventions, if any, per neurosurgery:  01/17/2021 Dr. Duffy Rhody --[place]   Past or anticipated interventions, if any, per medical oncology:  Under care of Dr. Cecil Cobbs 01/28/2021 --Because of age >40, less than gross total resection, we ultimately recommended proceeding with course of intensity modulated  radiation therapy and concurrent daily Temozolomide.   --Radiation will be administered Mon-Fri over 6 weeks, Temodar will be dosed at 73m/m2 to be given daily over 42 days. --Every 2 weeks during radiation, labs will be checked accompanied by a clinical evaluation in the brain tumor clinic.   --For now will con't decadron taper and con't keppra.  If mood lability is still present, will consider changing AED therapy  Dose of Decadron, if applicable: N/A--reports finishing taper a couple of days ago; Reports mood/personality lability and becoming easily irritated while on steroids   Recent neurologic symptoms, if any:  Seizures: Patient denies Headaches: Reports occasional discomfort along left side/temporal aspect of head. Started after an old head injury ("elbow to head"). Usually mild, occurs in the evenings, and only occasionally requires OTC pain medication Nausea: Denies nausea, but does report he had episodes of altered sense of taste/appetite after biopsy Dizziness/ataxia: Patient denies (especially since starting KBier Difficulty with hand coordination: Patient denies Focal numbness/weakness: Denies, but does get physically fatigued faster (ex. Caring for 273year-old son, he has to rest more frequently) Visual deficits/changes: Overall, patient denies. He does report occasionally having "vertical double vision" when his eyes are fatigued Confusion/Memory deficits: Patient denies since starting KKennan(wife notes irritablity/trouble comprehending when on steroids)  SAFETY ISSUES: Prior radiation? No Pacemaker/ICD? No Possible current pregnancy? N/A Is the patient on methotrexate? No  Additional Complaints / other details: [facility], and reports he will have a functional MRI done same day as well

## 2021-02-05 ENCOUNTER — Ambulatory Visit
Admission: RE | Admit: 2021-02-05 | Discharge: 2021-02-05 | Disposition: A | Discharge status: Home / Self Care | Payer: BC Managed Care – PPO | Source: Ambulatory Visit | Attending: Radiation Oncology | Admitting: Radiation Oncology

## 2021-02-05 ENCOUNTER — Other Ambulatory Visit: Payer: Self-pay

## 2021-02-05 ENCOUNTER — Encounter: Payer: Self-pay | Admitting: Radiation Oncology

## 2021-02-05 VITALS — BP 126/81 | HR 83 | Temp 97.2°F | Resp 18 | Ht 68.0 in | Wt 159.0 lb

## 2021-02-05 DIAGNOSIS — E785 Hyperlipidemia, unspecified: Secondary | ICD-10-CM | POA: Diagnosis not present

## 2021-02-05 DIAGNOSIS — Z7984 Long term (current) use of oral hypoglycemic drugs: Secondary | ICD-10-CM | POA: Insufficient documentation

## 2021-02-05 DIAGNOSIS — K219 Gastro-esophageal reflux disease without esophagitis: Secondary | ICD-10-CM | POA: Insufficient documentation

## 2021-02-05 DIAGNOSIS — C710 Malignant neoplasm of cerebrum, except lobes and ventricles: Secondary | ICD-10-CM | POA: Insufficient documentation

## 2021-02-05 DIAGNOSIS — C719 Malignant neoplasm of brain, unspecified: Secondary | ICD-10-CM

## 2021-02-05 DIAGNOSIS — E119 Type 2 diabetes mellitus without complications: Secondary | ICD-10-CM | POA: Insufficient documentation

## 2021-02-05 DIAGNOSIS — Z79899 Other long term (current) drug therapy: Secondary | ICD-10-CM | POA: Diagnosis not present

## 2021-02-05 DIAGNOSIS — Z87442 Personal history of urinary calculi: Secondary | ICD-10-CM | POA: Diagnosis not present

## 2021-02-08 ENCOUNTER — Encounter: Payer: Self-pay | Admitting: *Deleted

## 2021-02-08 NOTE — Progress Notes (Signed)
[place] Psychosocial Distress Screening Clinical Social Work  Clinical Social Work was referred by distress screening protocol.  The patient scored a 8 on the Psychosocial Distress Thermometer which indicates severe distress. Clinical Social Worker contacted patient by phone to assess for distress and other psychosocial needs.   CSW made introductory call.  Mr. Sellards is currently working full-time and feels he is coping adequately at this time.  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  CSW and patient discussed common feelings associated with a new diagnosis and anxiety surrounding initial tests.  Mr. Fantasia reported some agitation, anxiousness, and periodic night terrors.  He feels they have resolved at this time. CSW encouraged patient to consider counseling to process recent life changes and establish new coping skills.    ONCBCN DISTRESS SCREENING 02/05/2021  Screening Type Initial Screening  Distress experienced in past week (1-10) 8  Practical problem type Work/school;Transportation;Childcare  Family Problem type Partner;Children;Other (comment)  Emotional problem type Depression;Nervousness/Anxiety;Adjusting to illness;Isolation/feeling alone;Feeling hopeless  Physician notified of physical symptoms Yes  Referral to clinical psychology No  Referral to clinical social work Yes  Referral to dietition No  Referral to financial advocate Yes  Referral to support programs Yes  Referral to palliative care No    Clinical Social Worker follow up needed: Yes.    If yes, follow up plan: CSW will send email to patient with contact information and additional information on support programs/services.  Gwinda Maine, LCSW

## 2021-02-18 LAB — SURGICAL PATHOLOGY

## 2021-02-21 ENCOUNTER — Encounter (HOSPITAL_COMMUNITY): Payer: Self-pay

## 2021-03-12 ENCOUNTER — Ambulatory Visit
Admission: RE | Admit: 2021-03-12 | Discharge: 2021-03-12 | Disposition: A | Discharge status: Home / Self Care | Payer: BC Managed Care – PPO | Source: Ambulatory Visit | Attending: Radiation Oncology | Admitting: Radiation Oncology

## 2021-03-12 ENCOUNTER — Other Ambulatory Visit: Payer: Self-pay

## 2021-03-12 DIAGNOSIS — C710 Malignant neoplasm of cerebrum, except lobes and ventricles: Secondary | ICD-10-CM | POA: Insufficient documentation

## 2021-03-12 DIAGNOSIS — Z510 Encounter for antineoplastic radiation therapy: Secondary | ICD-10-CM | POA: Diagnosis not present

## 2021-03-14 ENCOUNTER — Other Ambulatory Visit: Payer: Self-pay | Admitting: Internal Medicine

## 2021-03-14 ENCOUNTER — Telehealth: Payer: Self-pay

## 2021-03-14 ENCOUNTER — Other Ambulatory Visit (HOSPITAL_COMMUNITY): Payer: Self-pay

## 2021-03-14 ENCOUNTER — Encounter: Payer: Self-pay | Admitting: Internal Medicine

## 2021-03-14 DIAGNOSIS — C719 Malignant neoplasm of brain, unspecified: Secondary | ICD-10-CM

## 2021-03-14 LAB — HM DIABETES EYE EXAM

## 2021-03-14 MED ORDER — ONDANSETRON HCL 8 MG PO TABS
8.0000 mg | ORAL_TABLET | Freq: Two times a day (BID) | ORAL | 1 refills | Status: DC | PRN
  Filled 2021-03-14: qty 30, 15d supply, fill #0
  Filled 2021-03-18: qty 18, 9d supply, fill #0

## 2021-03-14 MED ORDER — TEMOZOLOMIDE 140 MG PO CAPS
140.0000 mg | ORAL_CAPSULE | Freq: Every day | ORAL | 0 refills | Status: DC
Start: 2021-03-14 — End: 2021-05-28
  Filled 2021-03-14: qty 42, 42d supply, fill #0
  Filled 2021-03-18: qty 8, 8d supply, fill #0
  Filled 2021-03-18: qty 20, 20d supply, fill #0
  Filled 2021-04-09: qty 14, 14d supply, fill #1

## 2021-03-14 NOTE — Telephone Encounter (Signed)
Oral Oncology Patient Advocate Encounter  Prior Authorization for Temodar has been approved.    PA# [id number]  Effective dates: 03/14/21 through 03/14/22  Patients co-pay is $0  Oral Oncology Clinic will continue to follow.   [place] Patient [place] Phone [phone] Fax [phone] 03/14/2021 9:50 AM

## 2021-03-14 NOTE — Progress Notes (Signed)
START ON PATHWAY REGIMEN - Neuro     One cycle, concurrent with RT:     Temozolomide   **Always confirm dose/schedule in your pharmacy ordering system**  Patient Characteristics: Grade 3 Oligodendroglioma, IDH-mutant and 1p/19q-codeleted, Newly Diagnosed / Treatment Naive Disease Classification: Glioma Disease Classification: Grade 3 Oligodendroglioma, IDH-mutant and 1p/19q-codeleted Disease Status: Newly Diagnosed / Treatment Naive Intent of Therapy: Non-Curative / Palliative Intent, Discussed with Patient

## 2021-03-14 NOTE — Telephone Encounter (Signed)
Oral Oncology Patient Advocate Encounter   Received notification from [facility] [place] that prior authorization for Temodar is required.   PA submitted on CoverMyMeds Key [id number] Status is pending   Oral Oncology Clinic will continue to follow.  [place] Patient [place] Phone [phone] Fax [phone] 03/14/2021 9:36 AM

## 2021-03-14 NOTE — Telephone Encounter (Signed)
Oral Oncology Pharmacist Encounter  Received new prescription for Temozolomide (Temodar) for the treatment of oligodendroglioma in conjunction with radiation, planned duration 6 weeks.  Prescription dose and frequency assessed, no interventions needed. Patient has zofran prescription for emetic potential.   Labs from 01/15/2021 assessed, no interventions needed.  Current medication list in Epic reviewed, no DDIs with temodar identified.   Evaluated chart and no patient barriers to medication adherence noted.   Patient agreement for treatment documented in MD note on 01/28/2021.  Prescription has been e-scribed to the [facility] for benefits analysis and approval.  Oral Oncology Clinic will continue to follow for insurance authorization, copayment issues, initial counseling and start date.  Drema Halon, PharmD Hematology/Oncology Clinical Pharmacist [facility] [phone] 03/14/2021 9:39 AM

## 2021-03-15 ENCOUNTER — Other Ambulatory Visit (HOSPITAL_COMMUNITY): Payer: Self-pay

## 2021-03-18 ENCOUNTER — Other Ambulatory Visit (HOSPITAL_COMMUNITY): Payer: Self-pay

## 2021-03-18 DIAGNOSIS — C710 Malignant neoplasm of cerebrum, except lobes and ventricles: Secondary | ICD-10-CM | POA: Diagnosis not present

## 2021-03-18 NOTE — Telephone Encounter (Signed)
Oral Chemotherapy Pharmacist Encounter  I spoke with patient for overview of: Temodar for the treatment of oligodendroglioma, IDH-1 mutated, 1p/19q-codeleted, in conjunction with radiation, planned duration concomitant phase 42 days of therapy.   Counseled patient on administration, dosing, side effects, monitoring, drug-food interactions, safe handling, storage, and disposal.  Patient will take Temodar 129m capsules, 1 capsule (140 mg total daily dose), by mouth once daily, may take at bedtime and on an empty stomach to decrease nausea and vomiting.  Patient will take Temodar concurrent with radiation for 42 days straight.  Temodar start date: 03/19/21 PM Radiation start date: 03/20/21   Patient will take Zofran 831mtablet, 1 tablet by mouth 30-60 min prior to Temodar dose to help decrease N/V once starting adjuvant therapy. Prophylactic Zofran will not be used at initiation of concurrent phase, but will be initiated if nausea develops despite Temodar administration on an empty stomach and at bedtime.   Adverse effects include but are not limited to: nausea, vomiting, anorexia, GI upset, rash, drug fever, and fatigue. Rare but serious adverse effects of pneumocystis pneumonia and secondary malignancy also discussed.  PCP prophylaxis will not be initiated at this time, but may be added based on lymphocyte count in the future.  Reviewed with patient importance of keeping a medication schedule and plan for any missed doses. No barriers to medication adherence identified.  Medication reconciliation performed and medication/allergy list updated.  Insurance authorization for Temodar has been obtained. Test claim at the pharmacy revealed copayment $0 for 1st fill of Temodar. This will ship from the [facility] 03/18/21 to deliver to patient's home on 03/19/21.  Patient informed the pharmacy will reach out 5-7 days prior to needing next fill of Temodar to coordinate  continued medication acquisition to prevent break in therapy.   All questions answered.  Mr. RoVolanteoiced understanding and appreciation.   Medication education handout placed in mail for patient. Patient knows to call the office with questions or concerns. Oral Chemotherapy Clinic phone number provided to patient.   ReLeron CroakPharmD, BCPS Hematology/Oncology Clinical Pharmacist [phone]/26/2022 12:46 PM

## 2021-03-19 ENCOUNTER — Other Ambulatory Visit (HOSPITAL_COMMUNITY): Payer: Self-pay

## 2021-03-19 ENCOUNTER — Other Ambulatory Visit: Payer: Self-pay

## 2021-03-19 ENCOUNTER — Inpatient Hospital Stay: Payer: BC Managed Care – PPO | Attending: Internal Medicine | Admitting: Internal Medicine

## 2021-03-19 VITALS — BP 133/99 | HR 80 | Temp 97.7°F | Resp 19 | Ht 68.0 in | Wt 164.2 lb

## 2021-03-19 DIAGNOSIS — E785 Hyperlipidemia, unspecified: Secondary | ICD-10-CM | POA: Diagnosis not present

## 2021-03-19 DIAGNOSIS — Z79899 Other long term (current) drug therapy: Secondary | ICD-10-CM | POA: Insufficient documentation

## 2021-03-19 DIAGNOSIS — R569 Unspecified convulsions: Secondary | ICD-10-CM | POA: Diagnosis not present

## 2021-03-19 DIAGNOSIS — C711 Malignant neoplasm of frontal lobe: Secondary | ICD-10-CM | POA: Diagnosis not present

## 2021-03-19 DIAGNOSIS — Z7984 Long term (current) use of oral hypoglycemic drugs: Secondary | ICD-10-CM | POA: Insufficient documentation

## 2021-03-19 DIAGNOSIS — Z9221 Personal history of antineoplastic chemotherapy: Secondary | ICD-10-CM | POA: Diagnosis not present

## 2021-03-19 DIAGNOSIS — Z85828 Personal history of other malignant neoplasm of skin: Secondary | ICD-10-CM | POA: Insufficient documentation

## 2021-03-19 DIAGNOSIS — E119 Type 2 diabetes mellitus without complications: Secondary | ICD-10-CM | POA: Diagnosis not present

## 2021-03-19 DIAGNOSIS — C710 Malignant neoplasm of cerebrum, except lobes and ventricles: Secondary | ICD-10-CM

## 2021-03-19 NOTE — Progress Notes (Signed)
[facility] at [place], [place] [address] [phone]  Interval Evaluation  Date of Service: 03/19/21 Patient Name: Alan Wise Patient MRN: [id number] Patient DOB: [birth year] Provider: Ventura Sellers, MD  Identifying Statement:  Alan Wise is a 50 y.o. male with R frontal oligodendroglioma   Oncology History  Oligodendroglioma ([place])  01/17/2021 Surgery   Stereotactic biopsy with Dr. Marcello Moores; path demonstrates IDH-1 mutant oligodendroglioma   03/20/2021 -  Chemotherapy    Patient is on Treatment Plan: BRAIN LOW GRADE GLIOMA GRADE II, ANAPLASTIC GLIOMA GRADE III, RADIATION THERAPY WITH CONCURRENT TEMOZOLOMIDE 75MG/M2 DAILY FOLLOWED BY SEQUENTIAL MAINTENANCE TEMOZOLOMIDE         Biomarkers:  MGMT Unknown.  IDH 1/2 Mutated.  1p/19q Co-deleted  TERT Unknown   Interval History: Alan Wise presents today for follow up.  No new or progressive deficits.  He is back and work and doing well.  Occasionally has difficulty finding a word, but doesn't interfere with day to day at work or at home.  No issues with gait or left sided weakness, no further seizures.  H+P (12/31/20) Patient presented to medical attention this past month, with complaint of involuntary left sided shaking.  He describes episodes, occurring several times per week, of left arm shaking, lasting up to 5 minutes; following the episodes he describes the arm as "heavy" for some time before returning to normal.  He also describes episodes of sudden inability to speak, with strange movements of face and hands, lasting same amount of time.  These episodes go back ~10 years in very sporadic nature, but have become much more frequent in recent months.  He works full time as a Health and safety inspector at [employer].  These episodes have occurred at work, and have been disruptive in that environment.  He is otherwise fully functional,  independent.  Medications: Current Outpatient Medications on File Prior to Visit  Medication Sig Dispense Refill   atorvastatin (LIPITOR) 20 MG tablet Take 1 tablet (20 mg total) by mouth daily. 90 tablet 3   docusate sodium (COLACE) 100 MG capsule Take 1 capsule (100 mg total) by mouth 2 (two) times daily. (Patient not taking: Reported on 01/28/2021) 60 capsule 2   FREESTYLE LITE test strip USE TWO TIMES DAILY TO TEST BLOOD SUGAR (Patient not taking: Reported on 01/28/2021) 50 each 11   glipiZIDE (GLUCOTROL) 10 MG tablet TAKE 1 TABLET (10 MG TOTAL) BY MOUTH 2 (TWO) TIMES DAILY BEFORE A MEAL. (Patient taking differently: Take 10 mg by mouth 2 (two) times daily.) 180 tablet 0   levETIRAcetam (KEPPRA) 500 MG tablet Take 1 tablet (500 mg total) by mouth 2 (two) times daily. 60 tablet 3   ondansetron (ZOFRAN) 8 MG tablet Take 1 tablet by mouth 2 times daily as needed (nausea and vomiting). May take 30 - 60 minutes prior to Temodar administration if nausea/vomiting occurs. 30 tablet 1   sitaGLIPtin (JANUVIA) 50 MG tablet Take 1 tablet (50 mg total) by mouth daily. 90 tablet 0   temozolomide (TEMODAR) 140 MG capsule Take 1 capsule (140 mg total) by mouth daily. May take on an empty stomach to decrease nausea & vomiting. 42 capsule 0   No current facility-administered medications on file prior to visit.    Allergies:  Allergies  Allergen Reactions   Contrast Media [Iodinated Diagnostic Agents] Swelling   Past Medical History:  Past Medical History:  Diagnosis Date   Allergy  Complication of anesthesia    pt reports he is starting to have more difficulty arousing after surgery   Diabetes mellitus ([place])    GERD (gastroesophageal reflux disease)    Headache    History of kidney stones    Hyperlipidemia    Renal disorder    kidney stones   Past Surgical History:  Past Surgical History:  Procedure Laterality Date   APPLICATION OF CRANIAL NAVIGATION Right 01/17/2021   Procedure: APPLICATION  OF CRANIAL NAVIGATION;  Surgeon: Vallarie Mare, MD;  Location: [place];  Service: Neurosurgery;  Laterality: Right;   COLONOSCOPY  09/03/1994   CYSTOSCOPY WITH STENT PLACEMENT Right 01/25/2016   Procedure: CYSTOSCOPY WITH STENT PLACEMENT;  Surgeon: Nickie Retort, MD;  Location: [facility];  Service: Urology;  Laterality: Right;   FRAMELESS  BIOPSY WITH BRAINLAB Right 01/17/2021   Procedure: RIGHT STEREOTACTIC BIOPSY OF INSULAR LESION;  Surgeon: Vallarie Mare, MD;  Location: [facility] [place];  Service: Neurosurgery;  Laterality: Right;   KNEE ARTHROSCOPY W/ MENISCAL REPAIR Right 06/23/1988   removal of birthmark  06/08/2013   SKIN CANCER EXCISION  06/23/2012   TYMPANOSTOMY TUBE PLACEMENT  08/06/1978   URETEROSCOPY WITH HOLMIUM LASER LITHOTRIPSY Right 01/25/2016   Procedure: URETEROSCOPY WITH HOLMIUM LASER LITHOTRIPSY;  Surgeon: Nickie Retort, MD;  Location: [facility];  Service: Urology;  Laterality: Right;   URETHRAL STRICTURE DILATATION     visual inspection of vocal cord     1973   WISDOM TOOTH EXTRACTION     Social History:  Social History   Socioeconomic History   Marital status: Married    Spouse name: Not on file   Number of children: Not on file   Years of education: Not on file   Highest education level: Not on file  Occupational History   Not on file  Tobacco Use   Smoking status: Never   Smokeless tobacco: Never  Vaping Use   Vaping Use: Never used  Substance and Sexual Activity   Alcohol use: Not Currently    Comment: rare   Drug use: No   Sexual activity: Not on file  Other Topics Concern   Not on file  Social History Narrative   Not on file   Social Determinants of Health   Financial Resource Strain: Not on file  Food Insecurity: Not on file  Transportation Needs: Not on file  Physical Activity: Not on file  Stress: Not on file  Social Connections: Not on file  Intimate Partner Violence: Not on file   Family History:  Family History  Problem  Relation Age of Onset   Hyperlipidemia Father    Hypertension Father    Diabetes Father    Benign prostatic hyperplasia Father    Stroke Maternal Grandmother    Diabetes Maternal Grandmother    Stroke Paternal Grandmother    Hypertension Paternal Grandmother    Kidney disease Neg Hx    Prostate cancer Neg Hx     Review of Systems: Constitutional: Doesn't report fevers, chills or abnormal weight loss Eyes: Doesn't report blurriness of vision Ears, nose, mouth, throat, and face: Doesn't report sore throat Respiratory: Doesn't report cough, dyspnea or wheezes Cardiovascular: Doesn't report palpitation, chest discomfort  Gastrointestinal:  Doesn't report nausea, constipation, diarrhea GU: Doesn't report incontinence Skin: Doesn't report skin rashes Neurological: Per HPI Musculoskeletal: Doesn't report joint pain Behavioral/Psych: Doesn't report anxiety  Physical Exam: Vitals:   03/19/21 0956  BP: (!) 133/99  Pulse: 80  Resp: 19  Temp:  97.7 F (36.5 C)  SpO2: 99%   KPS: 90. General: Alert, cooperative, pleasant, in no acute distress Head: Normal EENT: No conjunctival injection or scleral icterus.  Lungs: Resp effort normal Cardiac: Regular rate Abdomen: Non-distended abdomen Skin: No rashes cyanosis or petechiae. Extremities: No clubbing or edema  Neurologic Exam: Mental Status: Awake, alert, attentive to examiner. Oriented to self and environment. Language is fluent with intact comprehension.  Cranial Nerves: Visual acuity is grossly normal. Visual fields are full. Extra-ocular movements intact. No ptosis. Face is symmetric Motor: Tone and bulk are normal. Power is full in both arms and legs. Reflexes are symmetric, no pathologic reflexes present.  Sensory: Intact to light touch Gait: Normal.   Labs: I have reviewed the data as listed    Component Value Date/Time   NA 137 01/15/2021 1200   NA 138 05/04/2012 1815   K 4.3 01/15/2021 1200   K 3.5 05/04/2012 1815    CL 101 01/15/2021 1200   CL 99 05/04/2012 1815   CO2 30 01/15/2021 1200   CO2 29 05/04/2012 1815   GLUCOSE 137 (H) 01/15/2021 1200   GLUCOSE 69 05/04/2012 1815   BUN 7 01/15/2021 1200   BUN 12 05/04/2012 1815   CREATININE 0.93 01/15/2021 1200   CREATININE 0.79 12/05/2020 1014   CALCIUM 9.5 01/15/2021 1200   CALCIUM 9.2 05/04/2012 1815   PROT 7.0 12/05/2020 1014   PROT 8.5 (H) 05/04/2012 1815   ALBUMIN 4.5 08/30/2020 1506   ALBUMIN 4.5 05/04/2012 1815   AST 20 12/05/2020 1014   AST 39 (H) 05/04/2012 1815   ALT 43 12/05/2020 1014   ALT 77 05/04/2012 1815   ALKPHOS 108 08/30/2020 1506   ALKPHOS 129 05/04/2012 1815   BILITOT 0.5 12/05/2020 1014   BILITOT 0.4 05/04/2012 1815   GFRNONAA >60 01/15/2021 1200   GFRNONAA 106 12/05/2020 1014   GFRAA 123 12/05/2020 1014   Lab Results  Component Value Date   WBC 8.1 01/15/2021   NEUTROABS 4.6 01/18/2015   HGB 15.9 01/15/2021   HCT 45.0 01/15/2021   MCV 82.9 01/15/2021   PLT 234 01/15/2021    Assessment/Plan Low grade glioma of cerebrum (HCC)  Focal seizures (HCC)  Akhil Piscopo Mangels is clinically stable today.  He elected against more aggressive craniotomy due to risk of post-operative deficits.  We ultimately recommended proceeding with course of intensity modulated radiation therapy and concurrent daily Temozolomide.  Radiation will be administered Mon-Fri over 6 weeks, Temodar will be dosed at 47m/m2 to be given daily over 42 days.  We reviewed side effects of temodar, including fatigue, nausea/vomiting, constipation, and cytopenias.  Informed consent was verbally obtained at bedside to proceed with oral chemotherapy.  Chemotherapy should be held for the following:  ANC less than 1,000  Platelets less than 100,000  LFT or creatinine greater than 2x ULN  If clinical concerns/contraindications develop  Every 2 weeks during radiation, labs will be checked accompanied by a clinical evaluation in the brain tumor clinic.     Will con't Keppra 5042mBID, he is seizure free currently.  All questions were answered. The patient knows to call the clinic with any problems, questions or concerns. No barriers to learning were detected.  The total time spent in the encounter was 30 minutes and more than 50% was on counseling and review of test results   ZaVentura SellersMD Medical Director of Neuro-Oncology [facility]9/27/22 12:43 PM

## 2021-03-20 ENCOUNTER — Ambulatory Visit
Admission: RE | Admit: 2021-03-20 | Discharge: 2021-03-20 | Disposition: A | Discharge status: Home / Self Care | Payer: BC Managed Care – PPO | Source: Ambulatory Visit | Attending: Radiation Oncology | Admitting: Radiation Oncology

## 2021-03-20 DIAGNOSIS — C710 Malignant neoplasm of cerebrum, except lobes and ventricles: Secondary | ICD-10-CM | POA: Diagnosis not present

## 2021-03-21 ENCOUNTER — Other Ambulatory Visit: Payer: Self-pay

## 2021-03-21 ENCOUNTER — Ambulatory Visit
Admission: RE | Admit: 2021-03-21 | Discharge: 2021-03-21 | Disposition: A | Discharge status: Home / Self Care | Payer: BC Managed Care – PPO | Source: Ambulatory Visit | Attending: Radiation Oncology | Admitting: Radiation Oncology

## 2021-03-21 DIAGNOSIS — C710 Malignant neoplasm of cerebrum, except lobes and ventricles: Secondary | ICD-10-CM | POA: Diagnosis not present

## 2021-03-22 ENCOUNTER — Ambulatory Visit
Admission: RE | Admit: 2021-03-22 | Discharge: 2021-03-22 | Disposition: A | Discharge status: Home / Self Care | Payer: BC Managed Care – PPO | Source: Ambulatory Visit | Attending: Radiation Oncology | Admitting: Radiation Oncology

## 2021-03-22 DIAGNOSIS — C710 Malignant neoplasm of cerebrum, except lobes and ventricles: Secondary | ICD-10-CM | POA: Diagnosis not present

## 2021-03-25 ENCOUNTER — Ambulatory Visit
Admission: RE | Admit: 2021-03-25 | Discharge: 2021-03-25 | Disposition: A | Discharge status: Home / Self Care | Payer: BC Managed Care – PPO | Source: Ambulatory Visit | Attending: Radiation Oncology | Admitting: Radiation Oncology

## 2021-03-25 ENCOUNTER — Other Ambulatory Visit: Payer: Self-pay | Admitting: Internal Medicine

## 2021-03-25 DIAGNOSIS — C710 Malignant neoplasm of cerebrum, except lobes and ventricles: Secondary | ICD-10-CM | POA: Insufficient documentation

## 2021-03-25 DIAGNOSIS — E119 Type 2 diabetes mellitus without complications: Secondary | ICD-10-CM | POA: Diagnosis not present

## 2021-03-25 DIAGNOSIS — Z510 Encounter for antineoplastic radiation therapy: Secondary | ICD-10-CM | POA: Insufficient documentation

## 2021-03-25 DIAGNOSIS — K219 Gastro-esophageal reflux disease without esophagitis: Secondary | ICD-10-CM | POA: Diagnosis not present

## 2021-03-25 DIAGNOSIS — Z87442 Personal history of urinary calculi: Secondary | ICD-10-CM | POA: Diagnosis not present

## 2021-03-25 DIAGNOSIS — E785 Hyperlipidemia, unspecified: Secondary | ICD-10-CM | POA: Diagnosis not present

## 2021-03-25 DIAGNOSIS — Z7984 Long term (current) use of oral hypoglycemic drugs: Secondary | ICD-10-CM | POA: Diagnosis not present

## 2021-03-25 DIAGNOSIS — R569 Unspecified convulsions: Secondary | ICD-10-CM | POA: Diagnosis not present

## 2021-03-25 DIAGNOSIS — C711 Malignant neoplasm of frontal lobe: Secondary | ICD-10-CM | POA: Diagnosis present

## 2021-03-25 NOTE — Progress Notes (Signed)
Pt here for patient teaching.  Pt given Radiation and You booklet, Managing Acute Radiation Side Effects for Head and Neck Cancer handout, skin care instructions, and Sonafine.  Reviewed areas of pertinence such as fatigue, hair loss, mouth changes, nausea and vomiting, skin changes, throat changes, headache, blurry vision, and taste changes . Pt able to give teach back of to pat skin, use unscented/gentle soap, have Imodium on hand, and drink plenty of water,apply Sonafine bid, avoid applying anything to skin within 4 hours of treatment, and to use an electric razor if they must shave. Pt verbalizes understanding of information given and will contact nursing with any questions or concerns.     [email or web address]

## 2021-03-26 ENCOUNTER — Ambulatory Visit
Admission: RE | Admit: 2021-03-26 | Discharge: 2021-03-26 | Disposition: A | Discharge status: Home / Self Care | Payer: BC Managed Care – PPO | Source: Ambulatory Visit | Attending: Radiation Oncology | Admitting: Radiation Oncology

## 2021-03-26 ENCOUNTER — Other Ambulatory Visit: Payer: Self-pay

## 2021-03-26 ENCOUNTER — Other Ambulatory Visit (HOSPITAL_COMMUNITY): Payer: Self-pay

## 2021-03-26 DIAGNOSIS — C711 Malignant neoplasm of frontal lobe: Secondary | ICD-10-CM | POA: Diagnosis not present

## 2021-03-27 ENCOUNTER — Ambulatory Visit
Admission: RE | Admit: 2021-03-27 | Discharge: 2021-03-27 | Disposition: A | Discharge status: Home / Self Care | Payer: BC Managed Care – PPO | Source: Ambulatory Visit | Attending: Radiation Oncology | Admitting: Radiation Oncology

## 2021-03-27 DIAGNOSIS — C711 Malignant neoplasm of frontal lobe: Secondary | ICD-10-CM | POA: Diagnosis not present

## 2021-03-28 ENCOUNTER — Ambulatory Visit
Admission: RE | Admit: 2021-03-28 | Discharge: 2021-03-28 | Disposition: A | Discharge status: Home / Self Care | Payer: BC Managed Care – PPO | Source: Ambulatory Visit | Attending: Radiation Oncology | Admitting: Radiation Oncology

## 2021-03-28 ENCOUNTER — Other Ambulatory Visit: Payer: Self-pay

## 2021-03-28 DIAGNOSIS — C711 Malignant neoplasm of frontal lobe: Secondary | ICD-10-CM | POA: Diagnosis not present

## 2021-03-29 ENCOUNTER — Ambulatory Visit
Admission: RE | Admit: 2021-03-29 | Discharge: 2021-03-29 | Disposition: A | Discharge status: Home / Self Care | Payer: BC Managed Care – PPO | Source: Ambulatory Visit | Attending: Radiation Oncology | Admitting: Radiation Oncology

## 2021-03-29 DIAGNOSIS — C711 Malignant neoplasm of frontal lobe: Secondary | ICD-10-CM | POA: Diagnosis not present

## 2021-04-01 ENCOUNTER — Other Ambulatory Visit: Payer: Self-pay

## 2021-04-01 ENCOUNTER — Ambulatory Visit
Admission: RE | Admit: 2021-04-01 | Discharge: 2021-04-01 | Disposition: A | Discharge status: Home / Self Care | Payer: BC Managed Care – PPO | Source: Ambulatory Visit | Attending: Radiation Oncology | Admitting: Radiation Oncology

## 2021-04-01 ENCOUNTER — Inpatient Hospital Stay: Payer: BC Managed Care – PPO

## 2021-04-01 ENCOUNTER — Inpatient Hospital Stay: Payer: BC Managed Care – PPO | Attending: Internal Medicine | Admitting: Internal Medicine

## 2021-04-01 VITALS — BP 125/85 | HR 81 | Temp 97.6°F | Resp 18 | Wt 162.2 lb

## 2021-04-01 DIAGNOSIS — C711 Malignant neoplasm of frontal lobe: Secondary | ICD-10-CM | POA: Insufficient documentation

## 2021-04-01 DIAGNOSIS — Z7984 Long term (current) use of oral hypoglycemic drugs: Secondary | ICD-10-CM | POA: Insufficient documentation

## 2021-04-01 DIAGNOSIS — C719 Malignant neoplasm of brain, unspecified: Secondary | ICD-10-CM | POA: Diagnosis not present

## 2021-04-01 DIAGNOSIS — K219 Gastro-esophageal reflux disease without esophagitis: Secondary | ICD-10-CM | POA: Insufficient documentation

## 2021-04-01 DIAGNOSIS — E119 Type 2 diabetes mellitus without complications: Secondary | ICD-10-CM | POA: Insufficient documentation

## 2021-04-01 DIAGNOSIS — Z87442 Personal history of urinary calculi: Secondary | ICD-10-CM | POA: Insufficient documentation

## 2021-04-01 DIAGNOSIS — R569 Unspecified convulsions: Secondary | ICD-10-CM | POA: Insufficient documentation

## 2021-04-01 DIAGNOSIS — E785 Hyperlipidemia, unspecified: Secondary | ICD-10-CM | POA: Insufficient documentation

## 2021-04-01 DIAGNOSIS — Z510 Encounter for antineoplastic radiation therapy: Secondary | ICD-10-CM | POA: Insufficient documentation

## 2021-04-01 LAB — CMP (CANCER CENTER ONLY)
ALT: 51 U/L — ABNORMAL HIGH (ref 0–44)
AST: 27 U/L (ref 15–41)
Albumin: 4.2 g/dL (ref 3.5–5.0)
Alkaline Phosphatase: 107 U/L (ref 38–126)
Anion gap: 8 (ref 5–15)
BUN: 8 mg/dL (ref 6–20)
CO2: 30 mmol/L (ref 22–32)
Calcium: 9.9 mg/dL (ref 8.9–10.3)
Chloride: 102 mmol/L (ref 98–111)
Creatinine: 0.97 mg/dL (ref 0.61–1.24)
GFR, Estimated: >60 mL/min (ref >60)
Glucose, Bld: 235 mg/dL — ABNORMAL HIGH (ref 70–99)
Potassium: 4.6 mmol/L (ref 3.5–5.1)
Sodium: 140 mmol/L (ref 135–145)
Total Bilirubin: 0.6 mg/dL (ref 0.3–1.2)
Total Protein: 7.3 g/dL (ref 6.5–8.1)

## 2021-04-01 LAB — CBC WITH DIFFERENTIAL (CANCER CENTER ONLY)
Abs Immature Granulocytes: 0.06 10*3/uL (ref 0.00–0.07)
Basophils Absolute: 0.1 10*3/uL (ref 0.0–0.1)
Basophils Relative: 1 %
Eosinophils Absolute: 0.4 10*3/uL (ref 0.0–0.5)
Eosinophils Relative: 5 %
HCT: 43.9 % (ref 39.0–52.0)
Hemoglobin: 15.6 g/dL (ref 13.0–17.0)
Immature Granulocytes: 1 %
Lymphocytes Relative: 18 %
Lymphs Abs: 1.5 10*3/uL (ref 0.7–4.0)
MCH: 29.6 pg (ref 26.0–34.0)
MCHC: 35.5 g/dL (ref 30.0–36.0)
MCV: 83.3 fL (ref 80.0–100.0)
Monocytes Absolute: 0.5 10*3/uL (ref 0.1–1.0)
Monocytes Relative: 6 %
Neutro Abs: 5.9 10*3/uL (ref 1.7–7.7)
Neutrophils Relative %: 69 %
Platelet Count: 253 10*3/uL (ref 150–400)
RBC: 5.27 MIL/uL (ref 4.22–5.81)
RDW: 12.1 % (ref 11.5–15.5)
WBC Count: 8.3 10*3/uL (ref 4.0–10.5)
nRBC: 0 % (ref 0.0–0.2)

## 2021-04-01 NOTE — Progress Notes (Signed)
[facility] at [place], [place] [address] [phone]  Interval Evaluation  Date of Service: 04/01/21 Patient Name: Alan Wise Patient MRN: [id number] Patient DOB: 1971/06/05 Provider: Ventura Sellers, MD  Identifying Statement:  Alan Wise is a 50 y.o. male with R frontal oligodendroglioma   Oncology History  Oligodendroglioma ([place])  01/17/2021 Surgery   Stereotactic biopsy with Dr. Marcello Moores; path demonstrates IDH-1 mutant oligodendroglioma   03/20/2021 -  Chemotherapy    Patient is on Treatment Plan: BRAIN LOW GRADE GLIOMA GRADE II, ANAPLASTIC GLIOMA GRADE III, RADIATION THERAPY WITH CONCURRENT TEMOZOLOMIDE 75MG/M2 DAILY FOLLOWED BY SEQUENTIAL MAINTENANCE TEMOZOLOMIDE         Biomarkers:  MGMT Unknown.  IDH 1/2 Mutated.  1p/19q Co-deleted  TERT Unknown   Interval History: Alan Wise presents today for follow up, now having completed 2 weeks of IMRT and Temodar.  No issues with treatment thus far, no new or progressive deficits.  He is back and work and doing well.  Occasionally has difficulty finding a word, but doesn't interfere with day to day at work or at home.  No issues with gait or left sided weakness, no further seizures.  H+P (12/31/20) Patient presented to medical attention this past month, with complaint of involuntary left sided shaking.  He describes episodes, occurring several times per week, of left arm shaking, lasting up to 5 minutes; following the episodes he describes the arm as "heavy" for some time before returning to normal.  He also describes episodes of sudden inability to speak, with strange movements of face and hands, lasting same amount of time.  These episodes go back ~10 years in very sporadic nature, but have become much more frequent in recent months.  He works full time as a Health and safety inspector at [employer].  These episodes have occurred at work, and have been disruptive in  that environment.  He is otherwise fully functional, independent.  Medications: Current Outpatient Medications on File Prior to Visit  Medication Sig Dispense Refill   atorvastatin (LIPITOR) 20 MG tablet Take 1 tablet (20 mg total) by mouth daily. 90 tablet 3   glipiZIDE (GLUCOTROL) 10 MG tablet TAKE 1 TABLET (10 MG TOTAL) BY MOUTH 2 (TWO) TIMES DAILY BEFORE A MEAL. (Patient taking differently: Take 10 mg by mouth 2 (two) times daily.) 180 tablet 0   JANUVIA 50 MG tablet TAKE 1 TABLET BY MOUTH EVERY DAY 90 tablet 0   levETIRAcetam (KEPPRA) 500 MG tablet Take 1 tablet (500 mg total) by mouth 2 (two) times daily. 60 tablet 3   temozolomide (TEMODAR) 140 MG capsule Take 1 capsule (140 mg total) by mouth daily. May take on an empty stomach to decrease nausea & vomiting. 42 capsule 0   docusate sodium (COLACE) 100 MG capsule Take 1 capsule (100 mg total) by mouth 2 (two) times daily. (Patient not taking: No sig reported) 60 capsule 2   FREESTYLE LITE test strip USE TWO TIMES DAILY TO TEST BLOOD SUGAR (Patient not taking: No sig reported) 50 each 11   ondansetron (ZOFRAN) 8 MG tablet Take 1 tablet by mouth 2 times daily as needed (nausea and vomiting). May take 30 - 60 minutes prior to Temodar administration if nausea/vomiting occurs. (Patient not taking: Reported on 04/01/2021) 30 tablet 1   No current facility-administered medications on file prior to visit.    Allergies:  Allergies  Allergen Reactions   Contrast Media [Iodinated Diagnostic Agents] Swelling  Past Medical History:  Past Medical History:  Diagnosis Date   Allergy    Complication of anesthesia    pt reports he is starting to have more difficulty arousing after surgery   Diabetes mellitus ([place])    GERD (gastroesophageal reflux disease)    Headache    History of kidney stones    Hyperlipidemia    Renal disorder    kidney stones   Past Surgical History:  Past Surgical History:  Procedure Laterality Date   APPLICATION OF  CRANIAL NAVIGATION Right 01/17/2021   Procedure: APPLICATION OF CRANIAL NAVIGATION;  Surgeon: Vallarie Mare, MD;  Location: [place];  Service: Neurosurgery;  Laterality: Right;   COLONOSCOPY  09/03/1994   CYSTOSCOPY WITH STENT PLACEMENT Right 01/25/2016   Procedure: CYSTOSCOPY WITH STENT PLACEMENT;  Surgeon: Nickie Retort, MD;  Location: [facility];  Service: Urology;  Laterality: Right;   FRAMELESS  BIOPSY WITH BRAINLAB Right 01/17/2021   Procedure: RIGHT STEREOTACTIC BIOPSY OF INSULAR LESION;  Surgeon: Vallarie Mare, MD;  Location: [facility];  Service: Neurosurgery;  Laterality: Right;   KNEE ARTHROSCOPY W/ MENISCAL REPAIR Right 06/23/1988   removal of birthmark  06/08/2013   SKIN CANCER EXCISION  06/23/2012   TYMPANOSTOMY TUBE PLACEMENT  08/06/1978   URETEROSCOPY WITH HOLMIUM LASER LITHOTRIPSY Right 01/25/2016   Procedure: URETEROSCOPY WITH HOLMIUM LASER LITHOTRIPSY;  Surgeon: Nickie Retort, MD;  Location: [facility];  Service: Urology;  Laterality: Right;   URETHRAL STRICTURE DILATATION     visual inspection of vocal cord     1973   WISDOM TOOTH EXTRACTION     Social History:  Social History   Socioeconomic History   Marital status: Married    Spouse name: Not on file   Number of children: Not on file   Years of education: Not on file   Highest education level: Not on file  Occupational History   Not on file  Tobacco Use   Smoking status: Never   Smokeless tobacco: Never  Vaping Use   Vaping Use: Never used  Substance and Sexual Activity   Alcohol use: Not Currently    Comment: rare   Drug use: No   Sexual activity: Not on file  Other Topics Concern   Not on file  Social History Narrative   Not on file   Social Determinants of Health   Financial Resource Strain: Not on file  Food Insecurity: Not on file  Transportation Needs: Not on file  Physical Activity: Not on file  Stress: Not on file  Social Connections: Not on file  Intimate Partner Violence: Not  on file   Family History:  Family History  Problem Relation Age of Onset   Hyperlipidemia Father    Hypertension Father    Diabetes Father    Benign prostatic hyperplasia Father    Stroke Maternal Grandmother    Diabetes Maternal Grandmother    Stroke Paternal Grandmother    Hypertension Paternal Grandmother    Kidney disease Neg Hx    Prostate cancer Neg Hx     Review of Systems: Constitutional: Doesn't report fevers, chills or abnormal weight loss Eyes: Doesn't report blurriness of vision Ears, nose, mouth, throat, and face: Doesn't report sore throat Respiratory: Doesn't report cough, dyspnea or wheezes Cardiovascular: Doesn't report palpitation, chest discomfort  Gastrointestinal:  Doesn't report nausea, constipation, diarrhea GU: Doesn't report incontinence Skin: Doesn't report skin rashes Neurological: Per HPI Musculoskeletal: Doesn't report joint pain Behavioral/Psych: Doesn't report anxiety  Physical Exam: Vitals:  04/01/21 0916  BP: 125/85  Pulse: 81  Resp: 18  Temp: 97.6 F (36.4 C)  SpO2: 98%   KPS: 90. General: Alert, cooperative, pleasant, in no acute distress Head: Normal EENT: No conjunctival injection or scleral icterus.  Lungs: Resp effort normal Cardiac: Regular rate Abdomen: Non-distended abdomen Skin: No rashes cyanosis or petechiae. Extremities: No clubbing or edema  Neurologic Exam: Mental Status: Awake, alert, attentive to examiner. Oriented to self and environment. Language is fluent with intact comprehension.  Cranial Nerves: Visual acuity is grossly normal. Visual fields are full. Extra-ocular movements intact. No ptosis. Face is symmetric Motor: Tone and bulk are normal. Power is full in both arms and legs. Reflexes are symmetric, no pathologic reflexes present.  Sensory: Intact to light touch Gait: Normal.   Labs: I have reviewed the data as listed    Component Value Date/Time   NA 140 04/01/2021 0858   NA 138 05/04/2012 1815    K 4.6 04/01/2021 0858   K 3.5 05/04/2012 1815   CL 102 04/01/2021 0858   CL 99 05/04/2012 1815   CO2 30 04/01/2021 0858   CO2 29 05/04/2012 1815   GLUCOSE 235 (H) 04/01/2021 0858   GLUCOSE 69 05/04/2012 1815   BUN 8 04/01/2021 0858   BUN 12 05/04/2012 1815   CREATININE 0.97 04/01/2021 0858   CREATININE 0.79 12/05/2020 1014   CALCIUM 9.9 04/01/2021 0858   CALCIUM 9.2 05/04/2012 1815   PROT 7.3 04/01/2021 0858   PROT 8.5 (H) 05/04/2012 1815   ALBUMIN 4.2 04/01/2021 0858   ALBUMIN 4.5 05/04/2012 1815   AST 27 04/01/2021 0858   ALT 51 (H) 04/01/2021 0858   ALT 77 05/04/2012 1815   ALKPHOS 107 04/01/2021 0858   ALKPHOS 129 05/04/2012 1815   BILITOT 0.6 04/01/2021 0858   GFRNONAA >60 04/01/2021 0858   GFRNONAA 106 12/05/2020 1014   GFRAA 123 12/05/2020 1014   Lab Results  Component Value Date   WBC 8.3 04/01/2021   NEUTROABS 5.9 04/01/2021   HGB 15.6 04/01/2021   HCT 43.9 04/01/2021   MCV 83.3 04/01/2021   PLT 253 04/01/2021    Assessment/Plan Oligodendroglioma ([place])  Focal seizures (HCC)  Melo Stauber Minjares is clinically stable today, now having completed first 2 weeks of IMRT+TMZ.    We ultimately recommended continuing with course of intensity modulated radiation therapy and concurrent daily Temozolomide.  Radiation will be administered Mon-Fri over 6 weeks, Temodar will be dosed at 25m/m2 to be given daily over 42 days.  We reviewed side effects of temodar, including fatigue, nausea/vomiting, constipation, and cytopenias.  Chemotherapy should be held for the following:  ANC less than 1,000  Platelets less than 100,000  LFT or creatinine greater than 2x ULN  If clinical concerns/contraindications develop  Every 2 weeks during radiation, labs will be checked accompanied by a clinical evaluation in the brain tumor clinic.    Will con't Keppra 5050mBID, he is seizure free currently.  All questions were answered. The patient knows to call the clinic with any  problems, questions or concerns. No barriers to learning were detected.  The total time spent in the encounter was 30 minutes and more than 50% was on counseling and review of test results   ZaVentura SellersMD Medical Director of Neuro-Oncology CoHouston [facility]t WeBig Sandy0/10/22 11:19 AM

## 2021-04-02 ENCOUNTER — Ambulatory Visit
Admission: RE | Admit: 2021-04-02 | Discharge: 2021-04-02 | Disposition: A | Discharge status: Home / Self Care | Payer: BC Managed Care – PPO | Source: Ambulatory Visit | Attending: Radiation Oncology | Admitting: Radiation Oncology

## 2021-04-02 DIAGNOSIS — C711 Malignant neoplasm of frontal lobe: Secondary | ICD-10-CM | POA: Diagnosis not present

## 2021-04-03 ENCOUNTER — Ambulatory Visit
Admission: RE | Admit: 2021-04-03 | Discharge: 2021-04-03 | Disposition: A | Discharge status: Home / Self Care | Payer: BC Managed Care – PPO | Source: Ambulatory Visit | Attending: Radiation Oncology | Admitting: Radiation Oncology

## 2021-04-03 ENCOUNTER — Other Ambulatory Visit: Payer: Self-pay

## 2021-04-03 DIAGNOSIS — C711 Malignant neoplasm of frontal lobe: Secondary | ICD-10-CM | POA: Diagnosis not present

## 2021-04-04 ENCOUNTER — Ambulatory Visit
Admission: RE | Admit: 2021-04-04 | Discharge: 2021-04-04 | Disposition: A | Discharge status: Home / Self Care | Payer: BC Managed Care – PPO | Source: Ambulatory Visit | Attending: Radiation Oncology | Admitting: Radiation Oncology

## 2021-04-04 DIAGNOSIS — C711 Malignant neoplasm of frontal lobe: Secondary | ICD-10-CM | POA: Diagnosis not present

## 2021-04-05 ENCOUNTER — Other Ambulatory Visit: Payer: Self-pay

## 2021-04-05 ENCOUNTER — Ambulatory Visit
Admission: RE | Admit: 2021-04-05 | Discharge: 2021-04-05 | Disposition: A | Discharge status: Home / Self Care | Payer: BC Managed Care – PPO | Source: Ambulatory Visit | Attending: Radiation Oncology | Admitting: Radiation Oncology

## 2021-04-05 DIAGNOSIS — C711 Malignant neoplasm of frontal lobe: Secondary | ICD-10-CM | POA: Diagnosis not present

## 2021-04-08 ENCOUNTER — Ambulatory Visit
Admission: RE | Admit: 2021-04-08 | Discharge: 2021-04-08 | Disposition: A | Discharge status: Home / Self Care | Payer: BC Managed Care – PPO | Source: Ambulatory Visit | Attending: Radiation Oncology | Admitting: Radiation Oncology

## 2021-04-08 ENCOUNTER — Other Ambulatory Visit: Payer: Self-pay

## 2021-04-08 DIAGNOSIS — C711 Malignant neoplasm of frontal lobe: Secondary | ICD-10-CM | POA: Diagnosis not present

## 2021-04-09 ENCOUNTER — Ambulatory Visit
Admission: RE | Admit: 2021-04-09 | Discharge: 2021-04-09 | Disposition: A | Discharge status: Home / Self Care | Payer: BC Managed Care – PPO | Source: Ambulatory Visit | Attending: Radiation Oncology | Admitting: Radiation Oncology

## 2021-04-09 ENCOUNTER — Other Ambulatory Visit (HOSPITAL_COMMUNITY): Payer: Self-pay

## 2021-04-09 DIAGNOSIS — C711 Malignant neoplasm of frontal lobe: Secondary | ICD-10-CM | POA: Diagnosis not present

## 2021-04-10 ENCOUNTER — Ambulatory Visit
Admission: RE | Admit: 2021-04-10 | Discharge: 2021-04-10 | Disposition: A | Discharge status: Home / Self Care | Payer: BC Managed Care – PPO | Source: Ambulatory Visit | Attending: Radiation Oncology | Admitting: Radiation Oncology

## 2021-04-10 ENCOUNTER — Other Ambulatory Visit (HOSPITAL_COMMUNITY): Payer: Self-pay

## 2021-04-10 ENCOUNTER — Other Ambulatory Visit: Payer: Self-pay

## 2021-04-10 DIAGNOSIS — C711 Malignant neoplasm of frontal lobe: Secondary | ICD-10-CM | POA: Diagnosis not present

## 2021-04-11 ENCOUNTER — Ambulatory Visit
Admission: RE | Admit: 2021-04-11 | Discharge: 2021-04-11 | Disposition: A | Discharge status: Home / Self Care | Payer: BC Managed Care – PPO | Source: Ambulatory Visit | Attending: Radiation Oncology | Admitting: Radiation Oncology

## 2021-04-11 DIAGNOSIS — C711 Malignant neoplasm of frontal lobe: Secondary | ICD-10-CM | POA: Diagnosis not present

## 2021-04-12 ENCOUNTER — Ambulatory Visit
Admission: RE | Admit: 2021-04-12 | Discharge: 2021-04-12 | Disposition: A | Discharge status: Home / Self Care | Payer: BC Managed Care – PPO | Source: Ambulatory Visit | Attending: Radiation Oncology | Admitting: Radiation Oncology

## 2021-04-12 ENCOUNTER — Other Ambulatory Visit: Payer: Self-pay

## 2021-04-12 DIAGNOSIS — C711 Malignant neoplasm of frontal lobe: Secondary | ICD-10-CM | POA: Diagnosis not present

## 2021-04-15 ENCOUNTER — Ambulatory Visit
Admission: RE | Admit: 2021-04-15 | Discharge: 2021-04-15 | Disposition: A | Discharge status: Home / Self Care | Payer: BC Managed Care – PPO | Source: Ambulatory Visit | Attending: Radiation Oncology | Admitting: Radiation Oncology

## 2021-04-15 ENCOUNTER — Other Ambulatory Visit (HOSPITAL_COMMUNITY): Payer: Self-pay

## 2021-04-15 ENCOUNTER — Other Ambulatory Visit: Payer: Self-pay

## 2021-04-15 DIAGNOSIS — C711 Malignant neoplasm of frontal lobe: Secondary | ICD-10-CM | POA: Diagnosis not present

## 2021-04-16 ENCOUNTER — Ambulatory Visit
Admission: RE | Admit: 2021-04-16 | Discharge: 2021-04-16 | Disposition: A | Discharge status: Home / Self Care | Payer: BC Managed Care – PPO | Source: Ambulatory Visit | Attending: Radiation Oncology | Admitting: Radiation Oncology

## 2021-04-16 ENCOUNTER — Inpatient Hospital Stay: Payer: BC Managed Care – PPO

## 2021-04-16 ENCOUNTER — Inpatient Hospital Stay (HOSPITAL_BASED_OUTPATIENT_CLINIC_OR_DEPARTMENT_OTHER): Payer: BC Managed Care – PPO | Admitting: Internal Medicine

## 2021-04-16 VITALS — BP 128/90 | HR 70 | Temp 97.6°F | Resp 18 | Ht 68.0 in | Wt 163.0 lb

## 2021-04-16 DIAGNOSIS — C719 Malignant neoplasm of brain, unspecified: Secondary | ICD-10-CM

## 2021-04-16 DIAGNOSIS — C711 Malignant neoplasm of frontal lobe: Secondary | ICD-10-CM | POA: Diagnosis not present

## 2021-04-16 DIAGNOSIS — R569 Unspecified convulsions: Secondary | ICD-10-CM

## 2021-04-16 DIAGNOSIS — C710 Malignant neoplasm of cerebrum, except lobes and ventricles: Secondary | ICD-10-CM

## 2021-04-16 LAB — CMP (CANCER CENTER ONLY)
ALT: 43 U/L (ref 0–44)
AST: 21 U/L (ref 15–41)
Albumin: 4.1 g/dL (ref 3.5–5.0)
Alkaline Phosphatase: 119 U/L (ref 38–126)
Anion gap: 7 (ref 5–15)
BUN: 10 mg/dL (ref 6–20)
CO2: 29 mmol/L (ref 22–32)
Calcium: 9.4 mg/dL (ref 8.9–10.3)
Chloride: 103 mmol/L (ref 98–111)
Creatinine: 0.86 mg/dL (ref 0.61–1.24)
GFR, Estimated: >60 mL/min (ref >60)
Glucose, Bld: 229 mg/dL — ABNORMAL HIGH (ref 70–99)
Potassium: 4.5 mmol/L (ref 3.5–5.1)
Sodium: 139 mmol/L (ref 135–145)
Total Bilirubin: 0.5 mg/dL (ref 0.3–1.2)
Total Protein: 7 g/dL (ref 6.5–8.1)

## 2021-04-16 LAB — CBC WITH DIFFERENTIAL (CANCER CENTER ONLY)
Abs Immature Granulocytes: 0.02 10*3/uL (ref 0.00–0.07)
Basophils Absolute: 0.1 10*3/uL (ref 0.0–0.1)
Basophils Relative: 1 %
Eosinophils Absolute: 0.2 10*3/uL (ref 0.0–0.5)
Eosinophils Relative: 3 %
HCT: 40.9 % (ref 39.0–52.0)
Hemoglobin: 14.3 g/dL (ref 13.0–17.0)
Immature Granulocytes: 0 %
Lymphocytes Relative: 16 %
Lymphs Abs: 1.1 10*3/uL (ref 0.7–4.0)
MCH: 29.5 pg (ref 26.0–34.0)
MCHC: 35 g/dL (ref 30.0–36.0)
MCV: 84.3 fL (ref 80.0–100.0)
Monocytes Absolute: 0.6 10*3/uL (ref 0.1–1.0)
Monocytes Relative: 8 %
Neutro Abs: 4.8 10*3/uL (ref 1.7–7.7)
Neutrophils Relative %: 72 %
Platelet Count: 251 10*3/uL (ref 150–400)
RBC: 4.85 MIL/uL (ref 4.22–5.81)
RDW: 12.2 % (ref 11.5–15.5)
WBC Count: 6.8 10*3/uL (ref 4.0–10.5)
nRBC: 0 % (ref 0.0–0.2)

## 2021-04-16 NOTE — Progress Notes (Signed)
[facility] at [place], [place] [address] [phone]  Interval Evaluation  Date of Service: 04/16/21 Patient Name: Alan Wise Patient MRN: [id number] Patient DOB: [birth year] Provider: Ventura Sellers, MD  Identifying Statement:  Alan Wise is a 50 y.o. male with R frontal oligodendroglioma   Oncology History  Oligodendroglioma ([place])  01/17/2021 Surgery   Stereotactic biopsy with Dr. Marcello Moores; path demonstrates IDH-1 mutant oligodendroglioma   03/20/2021 -  Chemotherapy    Patient is on Treatment Plan: BRAIN LOW GRADE GLIOMA GRADE II, ANAPLASTIC GLIOMA GRADE III, RADIATION THERAPY WITH CONCURRENT TEMOZOLOMIDE 75MG/M2 DAILY FOLLOWED BY SEQUENTIAL MAINTENANCE TEMOZOLOMIDE         Biomarkers:  MGMT Unknown.  IDH 1/2 Mutated.  1p/19q Co-deleted  TERT Unknown   Interval History: Alan Wise presents today for follow up, now having completed #4 weeks of IMRT and Temodar.  No issues with treatment thus far, no new or progressive deficits.  Managing recent work stress well, though it has interfered some with sleep.  Occasionally has difficulty finding a word, as prior.  No issues with gait or left sided weakness, no further seizures.  H+P (12/31/20) Patient presented to medical attention this past month, with complaint of involuntary left sided shaking.  He describes episodes, occurring several times per week, of left arm shaking, lasting up to 5 minutes; following the episodes he describes the arm as "heavy" for some time before returning to normal.  He also describes episodes of sudden inability to speak, with strange movements of face and hands, lasting same amount of time.  These episodes go back ~10 years in very sporadic nature, but have become much more frequent in recent months.  He works full time as a Health and safety inspector at [employer].  These episodes have occurred at work, and have been disruptive in that  environment.  He is otherwise fully functional, independent.  Medications: Current Outpatient Medications on File Prior to Visit  Medication Sig Dispense Refill   atorvastatin (LIPITOR) 20 MG tablet Take 1 tablet (20 mg total) by mouth daily. 90 tablet 3   glipiZIDE (GLUCOTROL) 10 MG tablet TAKE 1 TABLET (10 MG TOTAL) BY MOUTH 2 (TWO) TIMES DAILY BEFORE A MEAL. (Patient taking differently: Take 10 mg by mouth 2 (two) times daily.) 180 tablet 0   JANUVIA 50 MG tablet TAKE 1 TABLET BY MOUTH EVERY DAY 90 tablet 0   levETIRAcetam (KEPPRA) 500 MG tablet Take 1 tablet (500 mg total) by mouth 2 (two) times daily. 60 tablet 3   temozolomide (TEMODAR) 140 MG capsule Take 1 capsule (140 mg total) by mouth daily. May take on an empty stomach to decrease nausea & vomiting. 42 capsule 0   docusate sodium (COLACE) 100 MG capsule Take 1 capsule (100 mg total) by mouth 2 (two) times daily. (Patient not taking: No sig reported) 60 capsule 2   FREESTYLE LITE test strip USE TWO TIMES DAILY TO TEST BLOOD SUGAR (Patient not taking: No sig reported) 50 each 11   ondansetron (ZOFRAN) 8 MG tablet Take 1 tablet by mouth 2 times daily as needed (nausea and vomiting). May take 30 - 60 minutes prior to Temodar administration if nausea/vomiting occurs. (Patient not taking: No sig reported) 30 tablet 1   No current facility-administered medications on file prior to visit.    Allergies:  Allergies  Allergen Reactions   Contrast Media [Iodinated Diagnostic Agents] Swelling   Past Medical History:  Past Medical History:  Diagnosis Date   Allergy    Complication of anesthesia    pt reports he is starting to have more difficulty arousing after surgery   Diabetes mellitus ([place])    GERD (gastroesophageal reflux disease)    Headache    History of kidney stones    Hyperlipidemia    Renal disorder    kidney stones   Past Surgical History:  Past Surgical History:  Procedure Laterality Date   APPLICATION OF CRANIAL  NAVIGATION Right 01/17/2021   Procedure: APPLICATION OF CRANIAL NAVIGATION;  Surgeon: Vallarie Mare, MD;  Location: [facility];  Service: Neurosurgery;  Laterality: Right;   COLONOSCOPY  09/03/1994   CYSTOSCOPY WITH STENT PLACEMENT Right 01/25/2016   Procedure: CYSTOSCOPY WITH STENT PLACEMENT;  Surgeon: Nickie Retort, MD;  Location: [facility];  Service: Urology;  Laterality: Right;   FRAMELESS  BIOPSY WITH BRAINLAB Right 01/17/2021   Procedure: RIGHT STEREOTACTIC BIOPSY OF INSULAR LESION;  Surgeon: Vallarie Mare, MD;  Location: [facility];  Service: Neurosurgery;  Laterality: Right;   KNEE ARTHROSCOPY W/ MENISCAL REPAIR Right 06/23/1988   removal of birthmark  06/08/2013   SKIN CANCER EXCISION  06/23/2012   TYMPANOSTOMY TUBE PLACEMENT  08/06/1978   URETEROSCOPY WITH HOLMIUM LASER LITHOTRIPSY Right 01/25/2016   Procedure: URETEROSCOPY WITH HOLMIUM LASER LITHOTRIPSY;  Surgeon: Nickie Retort, MD;  Location: [facility];  Service: Urology;  Laterality: Right;   URETHRAL STRICTURE DILATATION     visual inspection of vocal cord     1973   WISDOM TOOTH EXTRACTION     Social History:  Social History   Socioeconomic History   Marital status: Married    Spouse name: Not on file   Number of children: Not on file   Years of education: Not on file   Highest education level: Not on file  Occupational History   Not on file  Tobacco Use   Smoking status: Never   Smokeless tobacco: Never  Vaping Use   Vaping Use: Never used  Substance and Sexual Activity   Alcohol use: Not Currently    Comment: rare   Drug use: No   Sexual activity: Not on file  Other Topics Concern   Not on file  Social History Narrative   Not on file   Social Determinants of Health   Financial Resource Strain: Not on file  Food Insecurity: Not on file  Transportation Needs: Not on file  Physical Activity: Not on file  Stress: Not on file  Social Connections: Not on file  Intimate Partner Violence: Not on file    Family History:  Family History  Problem Relation Age of Onset   Hyperlipidemia Father    Hypertension Father    Diabetes Father    Benign prostatic hyperplasia Father    Stroke Maternal Grandmother    Diabetes Maternal Grandmother    Stroke Paternal Grandmother    Hypertension Paternal Grandmother    Kidney disease Neg Hx    Prostate cancer Neg Hx     Review of Systems: Constitutional: Doesn't report fevers, chills or abnormal weight loss Eyes: Doesn't report blurriness of vision Ears, nose, mouth, throat, and face: Doesn't report sore throat Respiratory: Doesn't report cough, dyspnea or wheezes Cardiovascular: Doesn't report palpitation, chest discomfort  Gastrointestinal:  Doesn't report nausea, constipation, diarrhea GU: Doesn't report incontinence Skin: Doesn't report skin rashes Neurological: Per HPI Musculoskeletal: Doesn't report joint pain Behavioral/Psych: Doesn't report anxiety  Physical Exam: Vitals:   04/16/21 3086  BP: 128/90  Pulse: 70  Resp: 18  Temp: 97.6 F (36.4 C)  SpO2: 99%   KPS: 90. General: Alert, cooperative, pleasant, in no acute distress Head: Normal EENT: No conjunctival injection or scleral icterus.  Lungs: Resp effort normal Cardiac: Regular rate Abdomen: Non-distended abdomen Skin: No rashes cyanosis or petechiae. Extremities: No clubbing or edema  Neurologic Exam: Mental Status: Awake, alert, attentive to examiner. Oriented to self and environment. Language is fluent with intact comprehension.  Cranial Nerves: Visual acuity is grossly normal. Visual fields are full. Extra-ocular movements intact. No ptosis. Face is symmetric Motor: Tone and bulk are normal. Power is full in both arms and legs. Reflexes are symmetric, no pathologic reflexes present.  Sensory: Intact to light touch Gait: Normal.   Labs: I have reviewed the data as listed    Component Value Date/Time   NA 140 04/01/2021 0858   NA 138 05/04/2012 1815   K  4.6 04/01/2021 0858   K 3.5 05/04/2012 1815   CL 102 04/01/2021 0858   CL 99 05/04/2012 1815   CO2 30 04/01/2021 0858   CO2 29 05/04/2012 1815   GLUCOSE 235 (H) 04/01/2021 0858   GLUCOSE 69 05/04/2012 1815   BUN 8 04/01/2021 0858   BUN 12 05/04/2012 1815   CREATININE 0.97 04/01/2021 0858   CREATININE 0.79 12/05/2020 1014   CALCIUM 9.9 04/01/2021 0858   CALCIUM 9.2 05/04/2012 1815   PROT 7.3 04/01/2021 0858   PROT 8.5 (H) 05/04/2012 1815   ALBUMIN 4.2 04/01/2021 0858   ALBUMIN 4.5 05/04/2012 1815   AST 27 04/01/2021 0858   ALT 51 (H) 04/01/2021 0858   ALT 77 05/04/2012 1815   ALKPHOS 107 04/01/2021 0858   ALKPHOS 129 05/04/2012 1815   BILITOT 0.6 04/01/2021 0858   GFRNONAA >60 04/01/2021 0858   GFRNONAA 106 12/05/2020 1014   GFRAA 123 12/05/2020 1014   Lab Results  Component Value Date   WBC 6.8 04/16/2021   NEUTROABS 4.8 04/16/2021   HGB 14.3 04/16/2021   HCT 40.9 04/16/2021   MCV 84.3 04/16/2021   PLT 251 04/16/2021    Assessment/Plan Low grade glioma of cerebrum (HCC)  Focal seizures (HCC)  Alan Wise is clinically stable today, now having completed #4 weeks of IMRT+TMZ.  Labs are within normal limits today.  We ultimately recommended continuing with course of intensity modulated radiation therapy and concurrent daily Temozolomide.  Radiation will be administered Mon-Fri over 6 weeks, Temodar will be dosed at 71m/m2 to be given daily over 42 days.  We reviewed side effects of temodar, including fatigue, nausea/vomiting, constipation, and cytopenias.  Chemotherapy should be held for the following:  ANC less than 1,000  Platelets less than 100,000  LFT or creatinine greater than 2x ULN  If clinical concerns/contraindications develop  Every 2 weeks during radiation, labs will be checked accompanied by a clinical evaluation in the brain tumor clinic.    Will con't Keppra 5050mBID, he is seizure free currently.  All questions were answered. The  patient knows to call the clinic with any problems, questions or concerns. No barriers to learning were detected.  The total time spent in the encounter was 30 minutes and more than 50% was on counseling and review of test results   ZaVentura SellersMD Medical Director of Neuro-Oncology [facility]0/25/22 8:55 AM

## 2021-04-17 ENCOUNTER — Other Ambulatory Visit: Payer: Self-pay

## 2021-04-17 ENCOUNTER — Ambulatory Visit
Admission: RE | Admit: 2021-04-17 | Discharge: 2021-04-17 | Disposition: A | Discharge status: Home / Self Care | Payer: BC Managed Care – PPO | Source: Ambulatory Visit | Attending: Radiation Oncology | Admitting: Radiation Oncology

## 2021-04-17 DIAGNOSIS — C711 Malignant neoplasm of frontal lobe: Secondary | ICD-10-CM | POA: Diagnosis not present

## 2021-04-18 ENCOUNTER — Ambulatory Visit
Admission: RE | Admit: 2021-04-18 | Discharge: 2021-04-18 | Disposition: A | Discharge status: Home / Self Care | Payer: BC Managed Care – PPO | Source: Ambulatory Visit | Attending: Radiation Oncology | Admitting: Radiation Oncology

## 2021-04-18 DIAGNOSIS — C711 Malignant neoplasm of frontal lobe: Secondary | ICD-10-CM | POA: Diagnosis not present

## 2021-04-19 ENCOUNTER — Other Ambulatory Visit: Payer: Self-pay

## 2021-04-19 ENCOUNTER — Ambulatory Visit
Admission: RE | Admit: 2021-04-19 | Discharge: 2021-04-19 | Disposition: A | Discharge status: Home / Self Care | Payer: BC Managed Care – PPO | Source: Ambulatory Visit | Attending: Radiation Oncology | Admitting: Radiation Oncology

## 2021-04-19 DIAGNOSIS — C711 Malignant neoplasm of frontal lobe: Secondary | ICD-10-CM | POA: Diagnosis not present

## 2021-04-22 ENCOUNTER — Ambulatory Visit
Admission: RE | Admit: 2021-04-22 | Discharge: 2021-04-22 | Disposition: A | Discharge status: Home / Self Care | Payer: BC Managed Care – PPO | Source: Ambulatory Visit | Attending: Radiation Oncology | Admitting: Radiation Oncology

## 2021-04-22 ENCOUNTER — Other Ambulatory Visit: Payer: Self-pay

## 2021-04-22 DIAGNOSIS — C711 Malignant neoplasm of frontal lobe: Secondary | ICD-10-CM | POA: Diagnosis not present

## 2021-04-23 ENCOUNTER — Ambulatory Visit
Admission: RE | Admit: 2021-04-23 | Discharge: 2021-04-23 | Disposition: A | Discharge status: Home / Self Care | Payer: BC Managed Care – PPO | Source: Ambulatory Visit | Attending: Radiation Oncology | Admitting: Radiation Oncology

## 2021-04-23 DIAGNOSIS — R569 Unspecified convulsions: Secondary | ICD-10-CM | POA: Diagnosis not present

## 2021-04-23 DIAGNOSIS — Z87442 Personal history of urinary calculi: Secondary | ICD-10-CM | POA: Diagnosis not present

## 2021-04-23 DIAGNOSIS — K219 Gastro-esophageal reflux disease without esophagitis: Secondary | ICD-10-CM | POA: Diagnosis not present

## 2021-04-23 DIAGNOSIS — E119 Type 2 diabetes mellitus without complications: Secondary | ICD-10-CM | POA: Diagnosis not present

## 2021-04-23 DIAGNOSIS — C719 Malignant neoplasm of brain, unspecified: Secondary | ICD-10-CM | POA: Insufficient documentation

## 2021-04-23 DIAGNOSIS — C711 Malignant neoplasm of frontal lobe: Secondary | ICD-10-CM | POA: Diagnosis present

## 2021-04-23 DIAGNOSIS — Z510 Encounter for antineoplastic radiation therapy: Secondary | ICD-10-CM | POA: Insufficient documentation

## 2021-04-23 DIAGNOSIS — E785 Hyperlipidemia, unspecified: Secondary | ICD-10-CM | POA: Diagnosis not present

## 2021-04-24 ENCOUNTER — Ambulatory Visit
Admission: RE | Admit: 2021-04-24 | Discharge: 2021-04-24 | Disposition: A | Discharge status: Home / Self Care | Payer: BC Managed Care – PPO | Source: Ambulatory Visit | Attending: Radiation Oncology | Admitting: Radiation Oncology

## 2021-04-24 ENCOUNTER — Other Ambulatory Visit: Payer: Self-pay

## 2021-04-24 DIAGNOSIS — C711 Malignant neoplasm of frontal lobe: Secondary | ICD-10-CM | POA: Diagnosis not present

## 2021-04-25 ENCOUNTER — Ambulatory Visit
Admission: RE | Admit: 2021-04-25 | Discharge: 2021-04-25 | Disposition: A | Discharge status: Home / Self Care | Payer: BC Managed Care – PPO | Source: Ambulatory Visit | Attending: Radiation Oncology | Admitting: Radiation Oncology

## 2021-04-25 DIAGNOSIS — C711 Malignant neoplasm of frontal lobe: Secondary | ICD-10-CM | POA: Diagnosis not present

## 2021-04-26 ENCOUNTER — Other Ambulatory Visit: Payer: Self-pay

## 2021-04-26 ENCOUNTER — Ambulatory Visit
Admission: RE | Admit: 2021-04-26 | Discharge: 2021-04-26 | Disposition: A | Discharge status: Home / Self Care | Payer: BC Managed Care – PPO | Source: Ambulatory Visit | Attending: Radiation Oncology | Admitting: Radiation Oncology

## 2021-04-26 DIAGNOSIS — C711 Malignant neoplasm of frontal lobe: Secondary | ICD-10-CM | POA: Diagnosis not present

## 2021-04-29 ENCOUNTER — Ambulatory Visit
Admission: RE | Admit: 2021-04-29 | Discharge: 2021-04-29 | Disposition: A | Discharge status: Home / Self Care | Payer: BC Managed Care – PPO | Source: Ambulatory Visit | Attending: Radiation Oncology | Admitting: Radiation Oncology

## 2021-04-29 ENCOUNTER — Other Ambulatory Visit: Payer: Self-pay

## 2021-04-29 ENCOUNTER — Telehealth: Payer: Self-pay | Admitting: *Deleted

## 2021-04-29 DIAGNOSIS — C711 Malignant neoplasm of frontal lobe: Secondary | ICD-10-CM | POA: Diagnosis not present

## 2021-04-29 DIAGNOSIS — C719 Malignant neoplasm of brain, unspecified: Secondary | ICD-10-CM

## 2021-04-29 NOTE — Telephone Encounter (Signed)
CALLED PATIENT TO INFORM OF NUTRITION APPT. WITH JESSICA CLAYTON ON 05-07-21 @ 9:45 AM, SPOKE WITH PATIENT AND HE IS AWARE OF THIS APPT.

## 2021-04-30 ENCOUNTER — Ambulatory Visit
Admission: RE | Admit: 2021-04-30 | Discharge: 2021-04-30 | Disposition: A | Discharge status: Home / Self Care | Payer: BC Managed Care – PPO | Source: Ambulatory Visit | Attending: Radiation Oncology | Admitting: Radiation Oncology

## 2021-04-30 ENCOUNTER — Inpatient Hospital Stay: Payer: BC Managed Care – PPO | Attending: Internal Medicine | Admitting: Internal Medicine

## 2021-04-30 ENCOUNTER — Inpatient Hospital Stay: Payer: BC Managed Care – PPO

## 2021-04-30 ENCOUNTER — Encounter: Payer: Self-pay | Admitting: Radiation Oncology

## 2021-04-30 VITALS — BP 113/77 | HR 78 | Temp 98.1°F | Resp 20 | Wt 158.6 lb

## 2021-04-30 DIAGNOSIS — C719 Malignant neoplasm of brain, unspecified: Secondary | ICD-10-CM

## 2021-04-30 DIAGNOSIS — M5481 Occipital neuralgia: Secondary | ICD-10-CM | POA: Diagnosis not present

## 2021-04-30 DIAGNOSIS — K219 Gastro-esophageal reflux disease without esophagitis: Secondary | ICD-10-CM | POA: Insufficient documentation

## 2021-04-30 DIAGNOSIS — R569 Unspecified convulsions: Secondary | ICD-10-CM | POA: Diagnosis not present

## 2021-04-30 DIAGNOSIS — E785 Hyperlipidemia, unspecified: Secondary | ICD-10-CM | POA: Insufficient documentation

## 2021-04-30 DIAGNOSIS — C711 Malignant neoplasm of frontal lobe: Secondary | ICD-10-CM | POA: Diagnosis not present

## 2021-04-30 DIAGNOSIS — E119 Type 2 diabetes mellitus without complications: Secondary | ICD-10-CM | POA: Insufficient documentation

## 2021-04-30 DIAGNOSIS — Z87442 Personal history of urinary calculi: Secondary | ICD-10-CM | POA: Insufficient documentation

## 2021-04-30 DIAGNOSIS — Z510 Encounter for antineoplastic radiation therapy: Secondary | ICD-10-CM | POA: Insufficient documentation

## 2021-04-30 LAB — CBC WITH DIFFERENTIAL (CANCER CENTER ONLY)
Abs Immature Granulocytes: 0.03 10*3/uL (ref 0.00–0.07)
Basophils Absolute: 0.1 10*3/uL (ref 0.0–0.1)
Basophils Relative: 1 %
Eosinophils Absolute: 0.3 10*3/uL (ref 0.0–0.5)
Eosinophils Relative: 5 %
HCT: 44 % (ref 39.0–52.0)
Hemoglobin: 15.6 g/dL (ref 13.0–17.0)
Immature Granulocytes: 1 %
Lymphocytes Relative: 12 %
Lymphs Abs: 0.7 10*3/uL (ref 0.7–4.0)
MCH: 29.7 pg (ref 26.0–34.0)
MCHC: 35.5 g/dL (ref 30.0–36.0)
MCV: 83.7 fL (ref 80.0–100.0)
Monocytes Absolute: 0.4 10*3/uL (ref 0.1–1.0)
Monocytes Relative: 7 %
Neutro Abs: 4.4 10*3/uL (ref 1.7–7.7)
Neutrophils Relative %: 74 %
Platelet Count: 213 10*3/uL (ref 150–400)
RBC: 5.26 MIL/uL (ref 4.22–5.81)
RDW: 12 % (ref 11.5–15.5)
WBC Count: 6 10*3/uL (ref 4.0–10.5)
nRBC: 0 % (ref 0.0–0.2)

## 2021-04-30 LAB — CMP (CANCER CENTER ONLY)
ALT: 45 U/L — ABNORMAL HIGH (ref 0–44)
AST: 19 U/L (ref 15–41)
Albumin: 4.4 g/dL (ref 3.5–5.0)
Alkaline Phosphatase: 118 U/L (ref 38–126)
Anion gap: 8 (ref 5–15)
BUN: 9 mg/dL (ref 6–20)
CO2: 29 mmol/L (ref 22–32)
Calcium: 9.5 mg/dL (ref 8.9–10.3)
Chloride: 101 mmol/L (ref 98–111)
Creatinine: 1.02 mg/dL (ref 0.61–1.24)
GFR, Estimated: >60 mL/min (ref >60)
Glucose, Bld: 226 mg/dL — ABNORMAL HIGH (ref 70–99)
Potassium: 4.6 mmol/L (ref 3.5–5.1)
Sodium: 138 mmol/L (ref 135–145)
Total Bilirubin: 0.6 mg/dL (ref 0.3–1.2)
Total Protein: 7.7 g/dL (ref 6.5–8.1)

## 2021-04-30 MED ORDER — LEVETIRACETAM 500 MG PO TABS: 500.0000 mg | ORAL_TABLET | Freq: Two times a day (BID) | ORAL | 3 refills | Status: DC

## 2021-04-30 NOTE — Progress Notes (Signed)
[facility] at [place], [place] [address] [phone]  Interval Evaluation  Date of Service: 04/30/21 Patient Name: Alan Wise Patient MRN: [id number] Patient DOB: 08-21-1970 Provider: Ventura Sellers, MD  Identifying Statement:  Alan Wise is a 50 y.o. male with R frontal oligodendroglioma   Oncology History  Oligodendroglioma ([place])  01/17/2021 Surgery   Stereotactic biopsy with Dr. Marcello Moores; path demonstrates IDH-1 mutant oligodendroglioma   03/20/2021 -  Chemotherapy    Patient is on Treatment Plan: BRAIN LOW GRADE GLIOMA GRADE II, ANAPLASTIC GLIOMA GRADE III, RADIATION THERAPY WITH CONCURRENT TEMOZOLOMIDE 75MG/M2 DAILY FOLLOWED BY SEQUENTIAL MAINTENANCE TEMOZOLOMIDE         Biomarkers:  MGMT Unknown.  IDH 1/2 Mutated.  1p/19q Co-deleted  TERT Unknown   Interval History: Alan Wise presents today for follow up, now having completed #6 weeks of IMRT and Temodar.  Fatigue has been more intense these past couple of weeks. But he denies any new or progressive neurologic deficits.  Work continues to be very stressful.  Occasionally has difficulty finding a word, as prior.  No issues with gait or left sided weakness, no further seizures.  H+P (12/31/20) Patient presented to medical attention this past month, with complaint of involuntary left sided shaking.  He describes episodes, occurring several times per week, of left arm shaking, lasting up to 5 minutes; following the episodes he describes the arm as "heavy" for some time before returning to normal.  He also describes episodes of sudden inability to speak, with strange movements of face and hands, lasting same amount of time.  These episodes go back ~10 years in very sporadic nature, but have become much more frequent in recent months.  He works full time as a Health and safety inspector at [employer].  These episodes have occurred at work, and have been disruptive  in that environment.  He is otherwise fully functional, independent.  Medications: Current Outpatient Medications on File Prior to Visit  Medication Sig Dispense Refill   atorvastatin (LIPITOR) 20 MG tablet Take 1 tablet (20 mg total) by mouth daily. 90 tablet 3   docusate sodium (COLACE) 100 MG capsule Take 1 capsule (100 mg total) by mouth 2 (two) times daily. (Patient not taking: No sig reported) 60 capsule 2   FREESTYLE LITE test strip USE TWO TIMES DAILY TO TEST BLOOD SUGAR (Patient not taking: No sig reported) 50 each 11   glipiZIDE (GLUCOTROL) 10 MG tablet TAKE 1 TABLET (10 MG TOTAL) BY MOUTH 2 (TWO) TIMES DAILY BEFORE A MEAL. (Patient taking differently: Take 10 mg by mouth 2 (two) times daily.) 180 tablet 0   JANUVIA 50 MG tablet TAKE 1 TABLET BY MOUTH EVERY DAY 90 tablet 0   levETIRAcetam (KEPPRA) 500 MG tablet Take 1 tablet (500 mg total) by mouth 2 (two) times daily. 60 tablet 3   ondansetron (ZOFRAN) 8 MG tablet Take 1 tablet by mouth 2 times daily as needed (nausea and vomiting). May take 30 - 60 minutes prior to Temodar administration if nausea/vomiting occurs. (Patient not taking: No sig reported) 30 tablet 1   temozolomide (TEMODAR) 140 MG capsule Take 1 capsule (140 mg total) by mouth daily. May take on an empty stomach to decrease nausea & vomiting. 42 capsule 0   No current facility-administered medications on file prior to visit.    Allergies:  Allergies  Allergen Reactions   Contrast Media [Iodinated Diagnostic Agents] Swelling   Past Medical  History:  Past Medical History:  Diagnosis Date   Allergy    Complication of anesthesia    pt reports he is starting to have more difficulty arousing after surgery   Diabetes mellitus (HCC)    GERD (gastroesophageal reflux disease)    Headache    History of kidney stones    Hyperlipidemia    Renal disorder    kidney stones   Past Surgical History:  Past Surgical History:  Procedure Laterality Date   APPLICATION OF  CRANIAL NAVIGATION Right 01/17/2021   Procedure: APPLICATION OF CRANIAL NAVIGATION;  Surgeon: Bedelia Person, MD;  Location: [facility];  Service: Neurosurgery;  Laterality: Right;   COLONOSCOPY  09/03/1994   CYSTOSCOPY WITH STENT PLACEMENT Right 01/25/2016   Procedure: CYSTOSCOPY WITH STENT PLACEMENT;  Surgeon: Hildred Laser, MD;  Location: [facility];  Service: Urology;  Laterality: Right;   FRAMELESS  BIOPSY WITH BRAINLAB Right 01/17/2021   Procedure: RIGHT STEREOTACTIC BIOPSY OF INSULAR LESION;  Surgeon: Bedelia Person, MD;  Location: [facility];  Service: Neurosurgery;  Laterality: Right;   KNEE ARTHROSCOPY W/ MENISCAL REPAIR Right 06/23/1988   removal of birthmark  06/08/2013   SKIN CANCER EXCISION  06/23/2012   TYMPANOSTOMY TUBE PLACEMENT  08/06/1978   URETEROSCOPY WITH HOLMIUM LASER LITHOTRIPSY Right 01/25/2016   Procedure: URETEROSCOPY WITH HOLMIUM LASER LITHOTRIPSY;  Surgeon: Hildred Laser, MD;  Location: [facility];  Service: Urology;  Laterality: Right;   URETHRAL STRICTURE DILATATION     visual inspection of vocal cord     1973   WISDOM TOOTH EXTRACTION     Social History:  Social History   Socioeconomic History   Marital status: Married    Spouse name: Not on file   Number of children: Not on file   Years of education: Not on file   Highest education level: Not on file  Occupational History   Not on file  Tobacco Use   Smoking status: Never   Smokeless tobacco: Never  Vaping Use   Vaping Use: Never used  Substance and Sexual Activity   Alcohol use: Not Currently    Comment: rare   Drug use: No   Sexual activity: Not on file  Other Topics Concern   Not on file  Social History Narrative   Not on file   Social Determinants of Health   Financial Resource Strain: Not on file  Food Insecurity: Not on file  Transportation Needs: Not on file  Physical Activity: Not on file  Stress: Not on file  Social Connections: Not on file  Intimate Partner Violence: Not  on file   Family History:  Family History  Problem Relation Age of Onset   Hyperlipidemia Father    Hypertension Father    Diabetes Father    Benign prostatic hyperplasia Father    Stroke Maternal Grandmother    Diabetes Maternal Grandmother    Stroke Paternal Grandmother    Hypertension Paternal Grandmother    Kidney disease Neg Hx    Prostate cancer Neg Hx     Review of Systems: Constitutional: Doesn't report fevers, chills or abnormal weight loss Eyes: Doesn't report blurriness of vision Ears, nose, mouth, throat, and face: Doesn't report sore throat Respiratory: Doesn't report cough, dyspnea or wheezes Cardiovascular: Doesn't report palpitation, chest discomfort  Gastrointestinal:  Doesn't report nausea, constipation, diarrhea GU: Doesn't report incontinence Skin: Doesn't report skin rashes Neurological: Per HPI Musculoskeletal: Doesn't report joint pain Behavioral/Psych: Doesn't report anxiety  Physical Exam: Vitals:  04/30/21 0848  BP: 113/77  Pulse: 78  Resp: 20  Temp: 98.1 F (36.7 C)  SpO2: 99%   KPS: 80. General: Fatigued Head: Normal EENT: No conjunctival injection or scleral icterus.  Lungs: Resp effort normal Cardiac: Regular rate Abdomen: Non-distended abdomen Skin: No rashes cyanosis or petechiae. Extremities: No clubbing or edema  Neurologic Exam: Mental Status: Awake, alert, attentive to examiner. Oriented to self and environment. Language is fluent with intact comprehension.  Modest psychomotor slowing, impaired 3 object recall Cranial Nerves: Visual acuity is grossly normal. Visual fields are full. Extra-ocular movements intact. No ptosis. Face is symmetric Motor: Tone and bulk are normal. Power is full in both arms and legs. Reflexes are symmetric, no pathologic reflexes present.  Sensory: Intact to light touch Gait: Normal.   Labs: I have reviewed the data as listed    Component Value Date/Time   NA 139 04/16/2021 0836   NA 138  05/04/2012 1815   K 4.5 04/16/2021 0836   K 3.5 05/04/2012 1815   CL 103 04/16/2021 0836   CL 99 05/04/2012 1815   CO2 29 04/16/2021 0836   CO2 29 05/04/2012 1815   GLUCOSE 229 (H) 04/16/2021 0836   GLUCOSE 69 05/04/2012 1815   BUN 10 04/16/2021 0836   BUN 12 05/04/2012 1815   CREATININE 0.86 04/16/2021 0836   CREATININE 0.79 12/05/2020 1014   CALCIUM 9.4 04/16/2021 0836   CALCIUM 9.2 05/04/2012 1815   PROT 7.0 04/16/2021 0836   PROT 8.5 (H) 05/04/2012 1815   ALBUMIN 4.1 04/16/2021 0836   ALBUMIN 4.5 05/04/2012 1815   AST 21 04/16/2021 0836   ALT 43 04/16/2021 0836   ALT 77 05/04/2012 1815   ALKPHOS 119 04/16/2021 0836   ALKPHOS 129 05/04/2012 1815   BILITOT 0.5 04/16/2021 0836   GFRNONAA >60 04/16/2021 0836   GFRNONAA 106 12/05/2020 1014   GFRAA 123 12/05/2020 1014   Lab Results  Component Value Date   WBC 6.0 04/30/2021   NEUTROABS 4.4 04/30/2021   HGB 15.6 04/30/2021   HCT 44.0 04/30/2021   MCV 83.7 04/30/2021   PLT 213 04/30/2021    Assessment/Plan Oligodendroglioma ([place])  Focal seizures (HCC)  Alan Wise is clinically stable today, now having completed 6 weeks of IMRT+TMZ.  Labs are within normal limits today.  He does exhibit tumor and treatment related fatigue and cognitive impairment, but he has maintained functional independence.  We ultimately recommended completing course of intensity modulated radiation therapy and concurrent daily Temozolomide.  Radiation will be administered Mon-Fri over 6 weeks, Temodar will be dosed at 65m/m2 to be given daily over 42 days.  We reviewed side effects of temodar, including fatigue, nausea/vomiting, constipation, and cytopenias.  Chemotherapy should be held for the following:  ANC less than 1,000  Platelets less than 100,000  LFT or creatinine greater than 2x ULN  If clinical concerns/contraindications develop  Will con't Keppra 5069mBID, he is seizure free currently.  Referral will be placed for Dr.  EiDavy Piqueor greater occipital nerve block for bilateral occipital neuralgia, chronic.  We ask that Alan PAVLOVeturn to clinic in 1 months following post-RT brain MRI, or sooner as needed.  All questions were answered. The patient knows to call the clinic with any problems, questions or concerns. No barriers to learning were detected.  The total time spent in the encounter was 30 minutes and more than 50% was on counseling and review of test results   ZaVentura SellersMD  Medical Director of Neuro-Oncology [facility] [place] 04/30/21 8:58 AM

## 2021-05-01 ENCOUNTER — Encounter: Payer: BC Managed Care – PPO | Admitting: Dietician

## 2021-05-07 ENCOUNTER — Other Ambulatory Visit: Payer: Self-pay

## 2021-05-07 ENCOUNTER — Inpatient Hospital Stay: Payer: BC Managed Care – PPO | Admitting: Dietician

## 2021-05-07 ENCOUNTER — Other Ambulatory Visit (HOSPITAL_COMMUNITY): Payer: Self-pay

## 2021-05-07 NOTE — Progress Notes (Signed)
Nutrition Assessment   Reason for Assessment: Poor po   ASSESSMENT: 50 year old male with right frontal oligodendroglioma. He has completed 6 weeks of IMRT. Patient currently receiving oral chemotherapy with Temodar. Patient is followed by Dr. Mickeal Skinner.   Past medical history includes DM2, GERD, HLD   Met with patient in office. Patient reports appetite and energy levels slowly improving in the past week s/p final radiation therapy. Patient denies nausea, vomiting, diarrhea. Constipation has resolved. He reports altered taste only to soft drinks. Patient sometimes finds the smells of cooking "offensive." Patient reports while undergoing IMRT, he would wake up early and not eat his first meal until lunch after arriving to work. Patient reports occasionally eating a few crackers for a snack before dinner meal around 7 PM which was balanced with protein, starch, veggies, but small portion.    Nutrition Focused Physical Exam: deferred   Medications: lipitor, glipizide, januvia, keppra, zofran   Labs: 11/8 - glucose Last HgbA1c on 6/15 - 9.1   Anthropometrics: Patient reports lowest weight 151 lb on Sunday increased to 153 lb today on home scale. Per chart last weight 158.6 lb decreased 5 lbs (3.1%) from 163 lb on 10/25. This is significant for time frame  Height: 5'8" Weight: 158 lb 9.6 oz (11/8) UBW: 165 lb 9.6 oz (6/15) BMI: 24.12    NUTRITION DIAGNOSIS: Inadequate oral intake related to cancer and associated treatment side effects as evidenced by reported decreased appetite, constipation, altered taste, fatigue, and 3.1% (5 lb) weight decrease in 2 weeks. This is significant for time frame.    INTERVENTION:  Discussed importance of adequate calorie and protein energy to maintain strength, weights, nutrition - handout provided Discussed strategies for improving appetite Encouraged small frequent meals and snacks with adequate calories and protein - handout with ideas  provided Recommend patient drink 1-2 oral nutrition supplements/day. Samples of Ensure Complete, Ensure Lorella [employer] provided for patient to try Discussed strategies for altered taste and smells - handout with tips provided Discussed strategies for fatigue - handout provided Educated on [facility] support services available to patient and spouse, handouts and November calendar of events provided Contact information provided   MONITORING, EVALUATION, GOAL: Patient will tolerate adequate calories and protein to minimize weight loss   Next Visit: To be scheduled as needed, patient has contact information and encouraged to call with nutrition questions/concerns.

## 2021-05-23 ENCOUNTER — Other Ambulatory Visit: Payer: Self-pay | Admitting: Radiation Therapy

## 2021-05-25 ENCOUNTER — Ambulatory Visit (HOSPITAL_COMMUNITY)
Admission: RE | Admit: 2021-05-25 | Discharge: 2021-05-25 | Disposition: A | Discharge status: Home / Self Care | Payer: BC Managed Care – PPO | Source: Ambulatory Visit | Attending: Internal Medicine | Admitting: Internal Medicine

## 2021-05-25 DIAGNOSIS — C710 Malignant neoplasm of cerebrum, except lobes and ventricles: Secondary | ICD-10-CM | POA: Insufficient documentation

## 2021-05-25 IMAGING — MR MR HEAD WO/W CM
15 series · 48 of 48 positions shown · IV contrast (gadavist)
Comparison: Head CT [DATE].  Brain MRI [DATE].

CLINICAL DATA: Low-grade glioma of cerebrum. Brain/CNS neoplasm,
assess treatment response.

EXAM:
MRI HEAD WITHOUT AND WITH CONTRAST
TECHNIQUE: Multiplanar, multiecho pulse sequences of the brain and surrounding
structures were obtained without and with intravenous contrast.
CONTRAST:  7mL GADAVIST GADOBUTROL 1 MMOL/ML IV SOLN

[Series 5: DWI · axial · 3.0mm · 1.36mm/px · z∈[-69,+70]mm · 6 of 96 slices shown (1 of 2)]
[im 1/96]
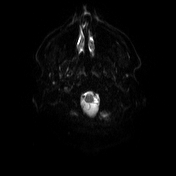
[im 20/96]
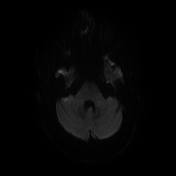
[im 39/96]
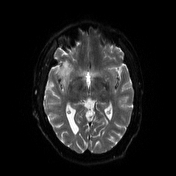
[im 58/96]
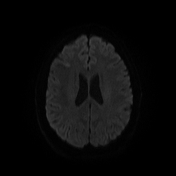
[im 77/96]
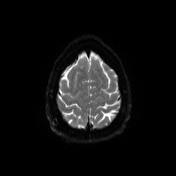
[im 96/96]
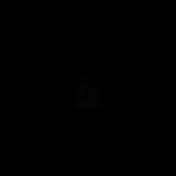

[Series 6: DWI · axial · 3.0mm · 1.36mm/px · z∈[-69,+70]mm · 3 of 48 slices shown (2 of 2)]
[im 1/48]
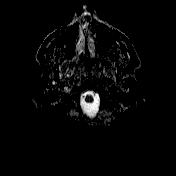
[im 24/48]
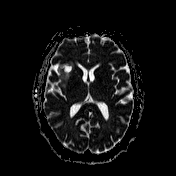
[im 48/48]
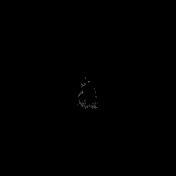

[Series 7: T1 · sagittal · 5.0mm · 0.75mm/px · 1 of 24 slices shown (1 of 4)]
[im 1/24]
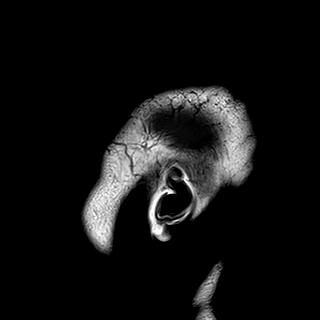

[Series 8: T2 · axial · 5.0mm · 0.62mm/px · 1 of 22 slices shown]
[im 1/22]
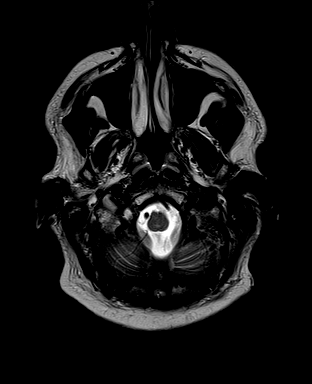

[Series 9: swi_images · axial · 3.0mm · 0.75mm/px · z∈[-101,+109]mm · 4 of 72 slices shown]
[im 1/72]
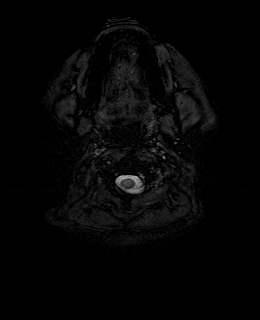
[im 24/72]
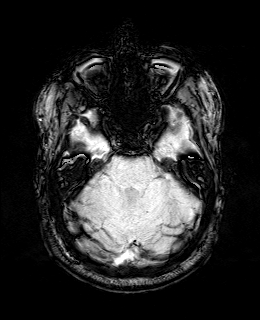
[im 48/72]
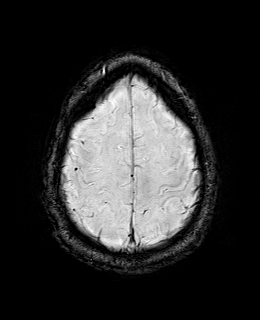
[im 72/72]
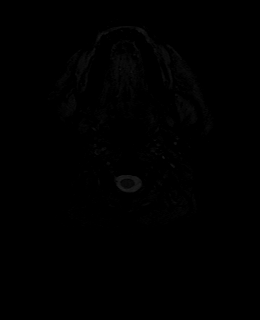

[Series 11: FLAIR · axial · 3.0mm · 0.75mm/px · z∈[-71,+80]mm · 3 of 52 slices shown]
[im 1/52]
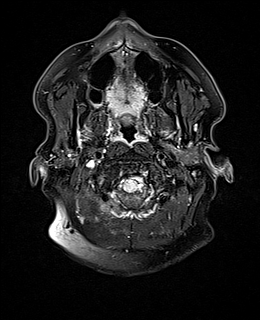
[im 26/52]
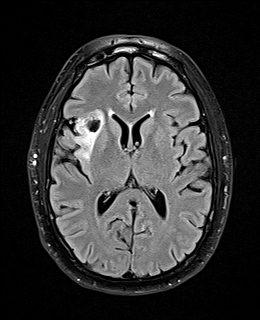
[im 52/52]
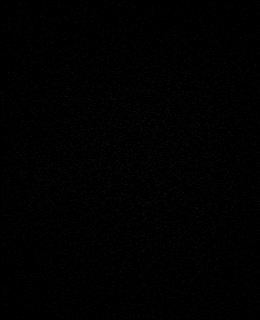

[Series 12: T1 · axial · 1.0mm · 0.94mm/px · z∈[-71,+70]mm · 8 of 144 slices shown (2 of 4)]
[im 1/144]
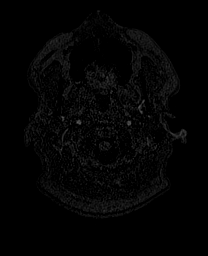
[im 21/144]
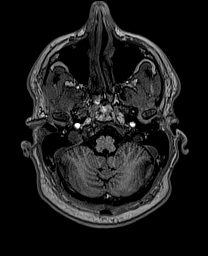
[im 41/144]
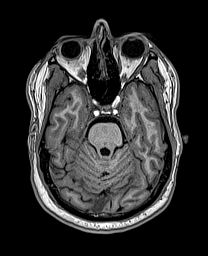
[im 62/144]
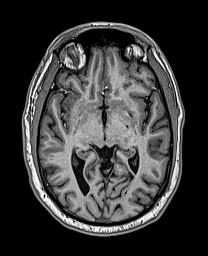
[im 82/144]
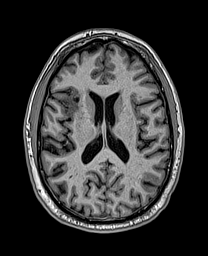
[im 103/144]
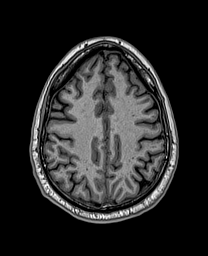
[im 123/144]
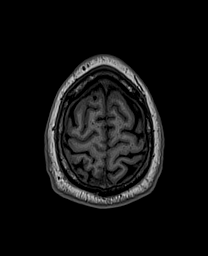
[im 144/144]
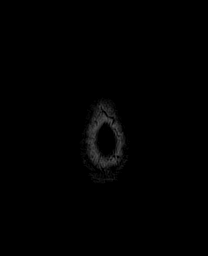

[Series 13: cor dwi_tracew · coronal · 5.0mm · 1.53mm/px · 3 of 60 slices shown]
[im 1/60]
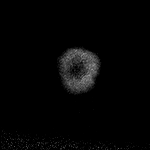
[im 30/60]
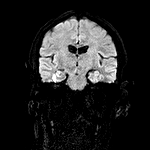
[im 60/60]
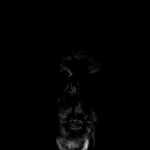

[Series 14: cor dwi_adc · coronal · 5.0mm · 1.53mm/px · 2 of 29 slices shown]
[im 1/29]
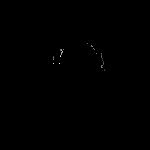
[im 29/29]
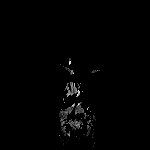

[Series 15: T2 post-contrast · coronal · 5.0mm · 0.57mm/px · 2 of 30 slices shown]
[im 1/30]
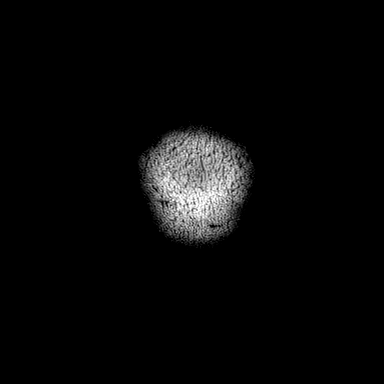
[im 30/30]
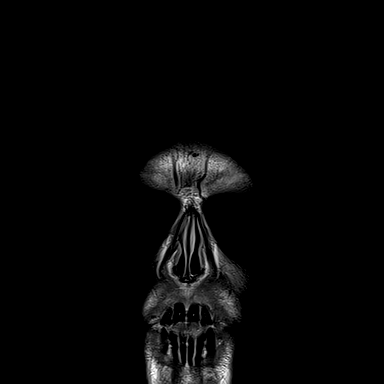

[Series 16: T1 post-contrast · axial · 1.0mm · 0.94mm/px · z∈[-71,+70]mm · 8 of 144 slices shown (1 of 3)]
[im 1/144]
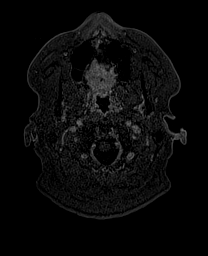
[im 21/144]
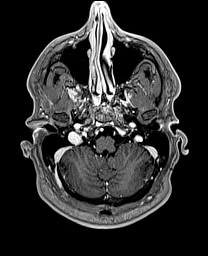
[im 41/144]
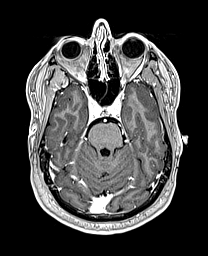
[im 62/144]
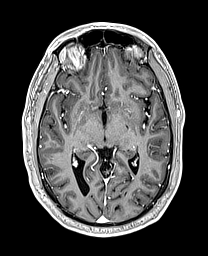
[im 82/144]
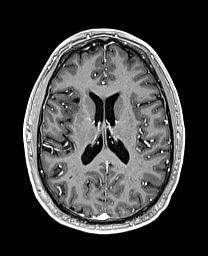
[im 103/144]
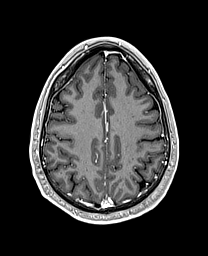
[im 123/144]
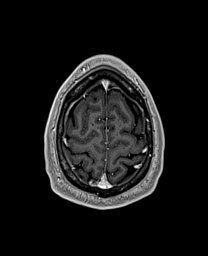
[im 144/144]
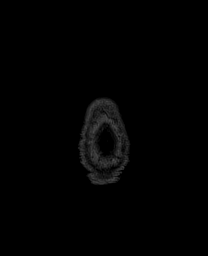

[Series 17: T1 · sagittal · 4.0mm · 0.94mm/px · 2 of 30 slices shown (3 of 4)]
[im 1/30]
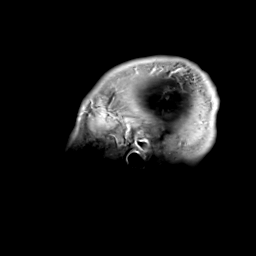
[im 30/30]
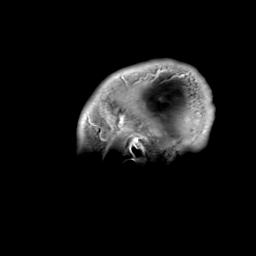

[Series 18: T1 · coronal · 4.0mm · 0.94mm/px · 2 of 30 slices shown (4 of 4)]
[im 1/30]
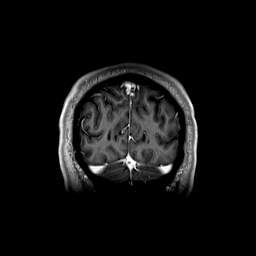
[im 30/30]
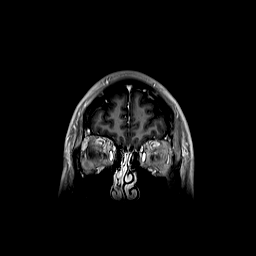

[Series 19: T1 post-contrast · coronal · 5.0mm · 0.43mm/px · 2 of 30 slices shown (2 of 3)]
[im 1/30]
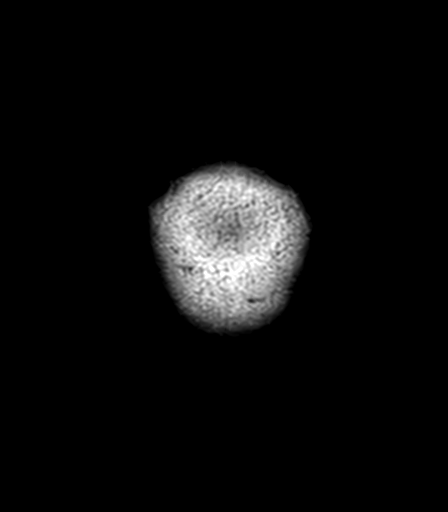
[im 30/30]
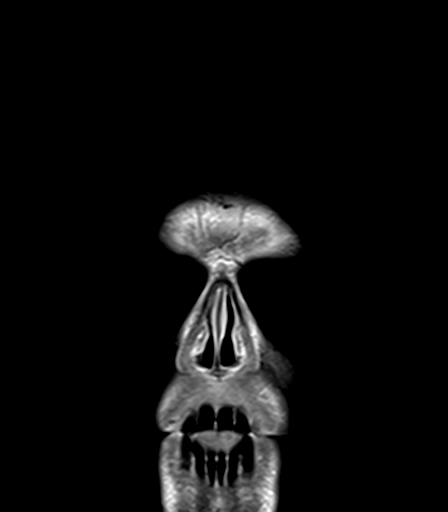

[Series 20: T1 post-contrast · sagittal · 5.0mm · 0.75mm/px · 1 of 24 slices shown (3 of 3)]
[im 1/24]
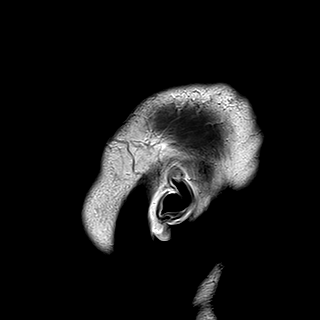

[48 of 48 positions shown; findings below may reference images not displayed]

FINDINGS: Brain:

Biopsy tract within the right frontal operculum/right insula with a
small amount of associated chronic blood products. Surrounding
expansile T2 FLAIR hyperintense signal abnormality within the right
insula/subinsular white matter, right frontal operculum and anterior
right temporal lobe. This signal abnormality has decreased in
extent, and there is decreased mass effect, as compared to the brain
MRI of [DATE]. The lesion now measures 3.9 x 3.0 x 3.8 cm
(previously 5.1 x 3.5 x 4.7 cm, when measured in similar fashion).
There is a subcentimeter focus of diffusion-weighted hyperintensity
along the anterolateral aspect of the lesion, without convincing
hypointense ADC correlate. There is no abnormal enhancement. No
ventricular effacement or midline shift.

No cortical encephalomalacia or significant white matter disease is
identified elsewhere. No extra-axial fluid collection.

Vascular: Maintained flow voids within the proximal large arterial
vessels.

Skull and upper cervical spine: No focal suspicious marrow lesion.

Sinuses/Orbits: Visualized orbits show no acute finding. Minimal
mucosal thickening within the bilateral ethmoid air cells.

Other: Trace fluid within the left mastoid air cells.
IMPRESSION: Redemonstrated non-enhancing tumor centered within the right
insula/subinsular white matter, frontal operculum and anterior
temporal lobe. Signal abnormality has decreased in extent, and there
is decreased mass effect, as compared to the brain MRI of
[DATE].

## 2021-05-25 MED ORDER — GADOBUTROL 1 MMOL/ML IV SOLN
7.0000 mL | Freq: Once | INTRAVENOUS | Status: AC | PRN
  Administered 2021-05-25: 7 mL via INTRAVENOUS

## 2021-05-28 ENCOUNTER — Inpatient Hospital Stay (HOSPITAL_BASED_OUTPATIENT_CLINIC_OR_DEPARTMENT_OTHER): Payer: BC Managed Care – PPO | Admitting: Internal Medicine

## 2021-05-28 ENCOUNTER — Other Ambulatory Visit: Payer: Self-pay

## 2021-05-28 ENCOUNTER — Telehealth: Payer: Self-pay | Admitting: Pharmacist

## 2021-05-28 ENCOUNTER — Inpatient Hospital Stay: Payer: BC Managed Care – PPO | Attending: Internal Medicine

## 2021-05-28 ENCOUNTER — Other Ambulatory Visit (HOSPITAL_COMMUNITY): Payer: Self-pay

## 2021-05-28 VITALS — BP 145/91 | HR 78 | Temp 97.6°F | Resp 16 | Ht 68.0 in | Wt 158.0 lb

## 2021-05-28 DIAGNOSIS — C719 Malignant neoplasm of brain, unspecified: Secondary | ICD-10-CM | POA: Diagnosis not present

## 2021-05-28 DIAGNOSIS — Z7984 Long term (current) use of oral hypoglycemic drugs: Secondary | ICD-10-CM | POA: Diagnosis not present

## 2021-05-28 DIAGNOSIS — C711 Malignant neoplasm of frontal lobe: Secondary | ICD-10-CM | POA: Diagnosis not present

## 2021-05-28 DIAGNOSIS — R569 Unspecified convulsions: Secondary | ICD-10-CM | POA: Diagnosis not present

## 2021-05-28 DIAGNOSIS — K219 Gastro-esophageal reflux disease without esophagitis: Secondary | ICD-10-CM | POA: Insufficient documentation

## 2021-05-28 DIAGNOSIS — E785 Hyperlipidemia, unspecified: Secondary | ICD-10-CM | POA: Diagnosis not present

## 2021-05-28 DIAGNOSIS — Z87442 Personal history of urinary calculi: Secondary | ICD-10-CM | POA: Insufficient documentation

## 2021-05-28 DIAGNOSIS — E119 Type 2 diabetes mellitus without complications: Secondary | ICD-10-CM | POA: Diagnosis not present

## 2021-05-28 LAB — CBC WITH DIFFERENTIAL (CANCER CENTER ONLY)
Abs Immature Granulocytes: 0.01 10*3/uL (ref 0.00–0.07)
Basophils Absolute: 0.1 10*3/uL (ref 0.0–0.1)
Basophils Relative: 1 %
Eosinophils Absolute: 0.2 10*3/uL (ref 0.0–0.5)
Eosinophils Relative: 4 %
HCT: 43.2 % (ref 39.0–52.0)
Hemoglobin: 14.9 g/dL (ref 13.0–17.0)
Immature Granulocytes: 0 %
Lymphocytes Relative: 12 %
Lymphs Abs: 0.8 10*3/uL (ref 0.7–4.0)
MCH: 29 pg (ref 26.0–34.0)
MCHC: 34.5 g/dL (ref 30.0–36.0)
MCV: 84 fL (ref 80.0–100.0)
Monocytes Absolute: 0.5 10*3/uL (ref 0.1–1.0)
Monocytes Relative: 7 %
Neutro Abs: 5.3 10*3/uL (ref 1.7–7.7)
Neutrophils Relative %: 76 %
Platelet Count: 170 10*3/uL (ref 150–400)
RBC: 5.14 MIL/uL (ref 4.22–5.81)
RDW: 11.7 % (ref 11.5–15.5)
WBC Count: 6.9 10*3/uL (ref 4.0–10.5)
nRBC: 0 % (ref 0.0–0.2)

## 2021-05-28 LAB — CMP (CANCER CENTER ONLY)
ALT: 37 U/L (ref 0–44)
AST: 21 U/L (ref 15–41)
Albumin: 4.2 g/dL (ref 3.5–5.0)
Alkaline Phosphatase: 101 U/L (ref 38–126)
Anion gap: 10 (ref 5–15)
BUN: 12 mg/dL (ref 6–20)
CO2: 29 mmol/L (ref 22–32)
Calcium: 9.2 mg/dL (ref 8.9–10.3)
Chloride: 103 mmol/L (ref 98–111)
Creatinine: 1.09 mg/dL (ref 0.61–1.24)
GFR, Estimated: >60 mL/min (ref >60)
Glucose, Bld: 202 mg/dL — ABNORMAL HIGH (ref 70–99)
Potassium: 4.4 mmol/L (ref 3.5–5.1)
Sodium: 142 mmol/L (ref 135–145)
Total Bilirubin: 0.5 mg/dL (ref 0.3–1.2)
Total Protein: 7.3 g/dL (ref 6.5–8.1)

## 2021-05-28 MED ORDER — ONDANSETRON HCL 8 MG PO TABS
8.0000 mg | ORAL_TABLET | Freq: Two times a day (BID) | ORAL | 1 refills | Status: DC | PRN
  Filled 2021-05-28: qty 30, 15d supply, fill #0
  Filled 2021-05-30: qty 18, 9d supply, fill #0

## 2021-05-28 MED ORDER — TEMOZOLOMIDE 140 MG PO CAPS
150.0000 mg/m2/d | ORAL_CAPSULE | Freq: Every day | ORAL | 0 refills | Status: AC
  Filled 2021-05-28: qty 10, 5d supply, fill #0
  Filled 2021-05-30: qty 10, 28d supply, fill #0

## 2021-05-28 NOTE — Progress Notes (Signed)
DISCONTINUE ON PATHWAY REGIMEN - Neuro     One cycle, concurrent with RT:     Temozolomide   **Always confirm dose/schedule in your pharmacy ordering system**  REASON: Continuation Of Treatment PRIOR TREATMENT: [id number]: Radiation Therapy with Concurrent Temozolomide 75 mg/m2 Daily x 6 Weeks, Followed by Adjuvant Temozolomide TREATMENT RESPONSE: Stable Disease (SD)  START ON PATHWAY REGIMEN - Neuro     A cycle is every 28 days:     Temozolomide      Temozolomide   **Always confirm dose/schedule in your pharmacy ordering system**  Patient Characteristics: Grade 3 Oligodendroglioma, IDH-mutant and 1p/19q-codeleted, Newly Diagnosed / Treatment Naive Disease Classification: Glioma Disease Classification: Grade 3 Oligodendroglioma, IDH-mutant and 1p/19q-codeleted Disease Status: Newly Diagnosed / Treatment Naive Intent of Therapy: Non-Curative / Palliative Intent, Discussed with Patient

## 2021-05-28 NOTE — Progress Notes (Signed)
[facility] at [address] [place], [place] [address] [phone]  Interval Evaluation  Date of Service: 05/28/21 Patient Name: Alan Wise Patient MRN: [id number] Patient DOB: 04-25-1971 Provider: Ventura Sellers, MD  Identifying Statement:  Alan Wise is a 50 y.o. male with R frontal oligodendroglioma   Oncology History  Oligodendroglioma ([place])  01/17/2021 Surgery   Stereotactic biopsy with Dr. Marcello Moores; path demonstrates IDH-1 mutant oligodendroglioma   03/20/2021 -  Chemotherapy    Patient is on Treatment Plan: BRAIN LOW GRADE GLIOMA GRADE II, ANAPLASTIC GLIOMA GRADE III, RADIATION THERAPY WITH CONCURRENT TEMOZOLOMIDE 75MG/M2 DAILY FOLLOWED BY SEQUENTIAL MAINTENANCE TEMOZOLOMIDE         Biomarkers:  MGMT Unknown.  IDH 1/2 Mutated.  1p/19q Co-deleted  TERT Unknown   Interval History: Alan Wise presents today for follow up after MRI brain.  He is now 1 month removed from IMRT and Temodar.  Fatigue and overall energy are notably improved compared to the end of radiation therapy.  He denies any new or progressive neurologic deficits.  Work continues to be very stressful.  Occasionally has difficulty finding a word, as prior.  No issues with gait or left sided weakness, no further seizures.  H+P (12/31/20) Patient presented to medical attention this past month, with complaint of involuntary left sided shaking.  He describes episodes, occurring several times per week, of left arm shaking, lasting up to 5 minutes; following the episodes he describes the arm as "heavy" for some time before returning to normal.  He also describes episodes of sudden inability to speak, with strange movements of face and hands, lasting same amount of time.  These episodes go back ~10 years in very sporadic nature, but have become much more frequent in recent months.  He works full time as a Health and safety inspector at [employer].  These episodes have  occurred at work, and have been disruptive in that environment.  He is otherwise fully functional, independent.  Medications: Current Outpatient Medications on File Prior to Visit  Medication Sig Dispense Refill   atorvastatin (LIPITOR) 20 MG tablet Take 1 tablet (20 mg total) by mouth daily. 90 tablet 3   docusate sodium (COLACE) 100 MG capsule Take 1 capsule (100 mg total) by mouth 2 (two) times daily. 60 capsule 2   FREESTYLE LITE test strip USE TWO TIMES DAILY TO TEST BLOOD SUGAR 50 each 11   glipiZIDE (GLUCOTROL) 10 MG tablet TAKE 1 TABLET (10 MG TOTAL) BY MOUTH 2 (TWO) TIMES DAILY BEFORE A MEAL. (Patient taking differently: Take 10 mg by mouth 2 (two) times daily.) 180 tablet 0   JANUVIA 50 MG tablet TAKE 1 TABLET BY MOUTH EVERY DAY 90 tablet 0   levETIRAcetam (KEPPRA) 500 MG tablet Take 1 tablet (500 mg total) by mouth 2 (two) times daily. 60 tablet 3   ondansetron (ZOFRAN) 8 MG tablet Take 1 tablet by mouth 2 times daily as needed (nausea and vomiting). May take 30 - 60 minutes prior to Temodar administration if nausea/vomiting occurs. 30 tablet 1   prednisoLONE acetate (PRED FORTE) 1 % ophthalmic suspension Place 1 drop into both eyes 2 (two) times daily.     temozolomide (TEMODAR) 140 MG capsule Take 1 capsule (140 mg total) by mouth daily. May take on an empty stomach to decrease nausea & vomiting. 42 capsule 0   No current facility-administered medications on file prior to visit.    Allergies:  Allergies  Allergen  Reactions   Contrast Media [Iodinated Diagnostic Agents] Swelling   Past Medical History:  Past Medical History:  Diagnosis Date   Allergy    Complication of anesthesia    pt reports he is starting to have more difficulty arousing after surgery   Diabetes mellitus ([place] [facility])    GERD (gastroesophageal reflux disease)    Headache    History of kidney stones    Hyperlipidemia    Renal disorder    kidney stones   Past Surgical History:  Past Surgical History:   Procedure Laterality Date   APPLICATION OF CRANIAL NAVIGATION Right 01/17/2021   Procedure: APPLICATION OF CRANIAL NAVIGATION;  Surgeon: Vallarie Mare, MD;  Location: [facility];  Service: Neurosurgery;  Laterality: Right;   COLONOSCOPY  09/03/1994   CYSTOSCOPY WITH STENT PLACEMENT Right 01/25/2016   Procedure: CYSTOSCOPY WITH STENT PLACEMENT;  Surgeon: Nickie Retort, MD;  Location: [facility];  Service: Urology;  Laterality: Right;   FRAMELESS  BIOPSY WITH BRAINLAB Right 01/17/2021   Procedure: RIGHT STEREOTACTIC BIOPSY OF INSULAR LESION;  Surgeon: Vallarie Mare, MD;  Location: [facility];  Service: Neurosurgery;  Laterality: Right;   KNEE ARTHROSCOPY W/ MENISCAL REPAIR Right 06/23/1988   removal of birthmark  06/08/2013   SKIN CANCER EXCISION  06/23/2012   TYMPANOSTOMY TUBE PLACEMENT  08/06/1978   URETEROSCOPY WITH HOLMIUM LASER LITHOTRIPSY Right 01/25/2016   Procedure: URETEROSCOPY WITH HOLMIUM LASER LITHOTRIPSY;  Surgeon: Nickie Retort, MD;  Location: [facility];  Service: Urology;  Laterality: Right;   URETHRAL STRICTURE DILATATION     visual inspection of vocal cord     1973   WISDOM TOOTH EXTRACTION     Social History:  Social History   Socioeconomic History   Marital status: Married    Spouse name: Not on file   Number of children: Not on file   Years of education: Not on file   Highest education level: Not on file  Occupational History   Not on file  Tobacco Use   Smoking status: Never   Smokeless tobacco: Never  Vaping Use   Vaping Use: Never used  Substance and Sexual Activity   Alcohol use: Not Currently    Comment: rare   Drug use: No   Sexual activity: Not on file  Other Topics Concern   Not on file  Social History Narrative   Not on file   Social Determinants of Health   Financial Resource Strain: Not on file  Food Insecurity: Not on file  Transportation Needs: Not on file  Physical Activity: Not on file  Stress: Not on file  Social  Connections: Not on file  Intimate Partner Violence: Not on file   Family History:  Family History  Problem Relation Age of Onset   Hyperlipidemia Father    Hypertension Father    Diabetes Father    Benign prostatic hyperplasia Father    Stroke Maternal Grandmother    Diabetes Maternal Grandmother    Stroke Paternal Grandmother    Hypertension Paternal Grandmother    Kidney disease Neg Hx    Prostate cancer Neg Hx     Review of Systems: Constitutional: Doesn't report fevers, chills or abnormal weight loss Eyes: Doesn't report blurriness of vision Ears, nose, mouth, throat, and face: Doesn't report sore throat Respiratory: Doesn't report cough, dyspnea or wheezes Cardiovascular: Doesn't report palpitation, chest discomfort  Gastrointestinal:  Doesn't report nausea, constipation, diarrhea GU: Doesn't report incontinence Skin: Doesn't report skin rashes Neurological: Per HPI Musculoskeletal: Doesn't  report joint pain Behavioral/Psych: Doesn't report anxiety  Physical Exam: Vitals:   05/28/21 0835  BP: (!) 145/91  Pulse: 78  Resp: 16  Temp: 97.6 F (36.4 C)  SpO2: 100%   KPS: 80. General: Fatigued Head: Normal EENT: No conjunctival injection or scleral icterus.  Lungs: Resp effort normal Cardiac: Regular rate Abdomen: Non-distended abdomen Skin: No rashes cyanosis or petechiae. Extremities: No clubbing or edema  Neurologic Exam: Mental Status: Awake, alert, attentive to examiner. Oriented to self and environment. Language is fluent with intact comprehension.  Modest psychomotor slowing, impaired 3 object recall Cranial Nerves: Visual acuity is grossly normal. Visual fields are full. Extra-ocular movements intact. No ptosis. Face is symmetric Motor: Tone and bulk are normal. Power is full in both arms and legs. Reflexes are symmetric, no pathologic reflexes present.  Sensory: Intact to light touch Gait: Normal.   Labs: I have reviewed the data as listed     Component Value Date/Time   NA 142 05/28/2021 0820   NA 138 05/04/2012 1815   K 4.4 05/28/2021 0820   K 3.5 05/04/2012 1815   CL 103 05/28/2021 0820   CL 99 05/04/2012 1815   CO2 29 05/28/2021 0820   CO2 29 05/04/2012 1815   GLUCOSE 202 (H) 05/28/2021 0820   GLUCOSE 69 05/04/2012 1815   BUN 12 05/28/2021 0820   BUN 12 05/04/2012 1815   CREATININE 1.09 05/28/2021 0820   CREATININE 0.79 12/05/2020 1014   CALCIUM 9.2 05/28/2021 0820   CALCIUM 9.2 05/04/2012 1815   PROT 7.3 05/28/2021 0820   PROT 8.5 (H) 05/04/2012 1815   ALBUMIN 4.2 05/28/2021 0820   ALBUMIN 4.5 05/04/2012 1815   AST 21 05/28/2021 0820   ALT 37 05/28/2021 0820   ALT 77 05/04/2012 1815   ALKPHOS 101 05/28/2021 0820   ALKPHOS 129 05/04/2012 1815   BILITOT 0.5 05/28/2021 0820   GFRNONAA >60 05/28/2021 0820   GFRNONAA 106 12/05/2020 1014   GFRAA 123 12/05/2020 1014   Lab Results  Component Value Date   WBC 6.9 05/28/2021   NEUTROABS 5.3 05/28/2021   HGB 14.9 05/28/2021   HCT 43.2 05/28/2021   MCV 84.0 05/28/2021   PLT 170 05/28/2021   Imaging:  [facility] Clinician Interpretation: I have personally reviewed the CNS images as listed.  My interpretation, in the context of the patient's clinical presentation, is stable disease  MR BRAIN W WO CONTRAST  Result Date: 05/26/2021 CLINICAL DATA:  Low-grade glioma of cerebrum. Brain/CNS neoplasm, assess treatment response. EXAM: MRI HEAD WITHOUT AND WITH CONTRAST TECHNIQUE: Multiplanar, multiecho pulse sequences of the brain and surrounding structures were obtained without and with intravenous contrast. CONTRAST:  24m GADAVIST GADOBUTROL 1 MMOL/ML IV SOLN COMPARISON:  Head CT 01/17/2021.  Brain MRI 12/21/2020. FINDINGS: Brain: Biopsy tract within the right frontal operculum/right insula with a small amount of associated chronic blood products. Surrounding expansile T2 FLAIR hyperintense signal abnormality within the right insula/subinsular white matter, right frontal  operculum and anterior right temporal lobe. This signal abnormality has decreased in extent, and there is decreased mass effect, as compared to the brain MRI of 12/21/2020. The lesion now measures 3.9 x 3.0 x 3.8 cm (previously 5.1 x 3.5 x 4.7 cm, when measured in similar fashion). There is a subcentimeter focus of diffusion-weighted hyperintensity along the anterolateral aspect of the lesion, without convincing hypointense ADC correlate. There is no abnormal enhancement. No ventricular effacement or midline shift. No cortical encephalomalacia or significant white matter disease is identified elsewhere.  No extra-axial fluid collection. Vascular: Maintained flow voids within the proximal large arterial vessels. Skull and upper cervical spine: No focal suspicious marrow lesion. Sinuses/Orbits: Visualized orbits show no acute finding. Minimal mucosal thickening within the bilateral ethmoid air cells. Other: Trace fluid within the left mastoid air cells. IMPRESSION: Redemonstrated non-enhancing tumor centered within the right insula/subinsular white matter, frontal operculum and anterior temporal lobe. Signal abnormality has decreased in extent, and there is decreased mass effect, as compared to the brain MRI of 12/21/2020. Electronically Signed   By: Kellie Simmering D.O.   On: 05/26/2021 15:07     Assessment/Plan Oligodendroglioma ([place])  Focal seizures ([facility])  Alan Wise is clinically stable today, now having completed IMRT and concurrent Temodar.  MRI demonstrates stable findings, with overall decrease in volume of T2 signal abnormality.  Labs are within normal limits today.    We recommended initiating treatment with cycle #1 Temozolomide 152m/m2, on for five days and off for twenty three days in twenty eight day cycles. The patient will have a complete blood count performed on days 21 and 28 of each cycle, and a comprehensive metabolic panel performed on day 28 of each cycle. Labs may need to be  performed more often. Zofran will prescribed for home use for nausea/vomiting.   Informed consent was obtained verbally at bedside to proceed with oral chemotherapy.  Chemotherapy should be held for the following:  ANC less than 1,000  Platelets less than 100,000  LFT or creatinine greater than 2x ULN  If clinical concerns/contraindications develop  Will con't Keppra [id number], he is seizure free currently.  Referral will again be placed for Dr. EiDavy Piqueor greater occipital nerve block for bilateral occipital neuralgia, chronic.  We ask that JoKEE DRUDGEeturn to clinic in 1 months prior to cycle #2 with labs for evaluation.  Next MRI brain can be following cycle #3.  All questions were answered. The patient knows to call the clinic with any problems, questions or concerns. No barriers to learning were detected.  The total time spent in the encounter was 40 minutes and more than 50% was on counseling and review of test results   ZaVentura SellersMD Medical Director of Neuro-Oncology [facility] [place] [facility]2/06/22 8:58 AM

## 2021-05-29 ENCOUNTER — Other Ambulatory Visit (HOSPITAL_COMMUNITY): Payer: Self-pay

## 2021-05-29 ENCOUNTER — Telehealth: Payer: Self-pay | Admitting: Internal Medicine

## 2021-05-29 NOTE — Telephone Encounter (Signed)
Oral Chemotherapy Pharmacist Encounter   Attempted to reach patient to provide update and offer for initial counseling on oral medication: Temodar (temozolomide).   No answer. Left voicemail for patient to call back to discuss details of medication acquisition and initial counseling session.  Leron Croak, PharmD, BCPS Hematology/Oncology Clinical Pharmacist [facility] [phone] 05/29/2021 10:43 AM

## 2021-05-29 NOTE — Telephone Encounter (Signed)
Oral Oncology Pharmacist Encounter  Received new prescription for Temodar (temozolomide) for the maintenance treatment of oligodendroglioma, planned duration ~6-12 cycles.  CBC w/ Diff and CMP from 05/28/21 assessed, no relevant lab abnormalities noted. Prescription dose and frequency assessed for appropriateness. Appropriate for therapy initiation.   Current medication list in Epic reviewed, no relevant/significant DDIs with Temodar identified.  Evaluated chart and no patient barriers to medication adherence noted.   Patient agreement for treatment documented in MD note on 05/28/21.  Prescription has been e-scribed to the [facility] for benefits analysis and approval.  Oral Oncology Clinic will continue to follow for insurance authorization, copayment issues, initial counseling and start date.  Leron Croak, PharmD, BCPS Hematology/Oncology Clinical Pharmacist [facility] [place] [phone] 05/29/2021 9:48 AM

## 2021-05-29 NOTE — Telephone Encounter (Signed)
Scheduled per 12/6 los, pt has been called and confirmed

## 2021-05-30 ENCOUNTER — Other Ambulatory Visit (HOSPITAL_COMMUNITY): Payer: Self-pay

## 2021-05-30 ENCOUNTER — Telehealth: Payer: Self-pay | Admitting: *Deleted

## 2021-05-30 NOTE — Telephone Encounter (Signed)
Faxed referral to Dr Davy Pique [phone]

## 2021-05-30 NOTE — Telephone Encounter (Signed)
Oral Chemotherapy Pharmacist Encounter  I spoke with patient for overview of: Temodar (temozolomide) for the maintenance treatment of oligodendroglioma, planned duration ~6-12 cycles.  Counseled on administration, dosing, side effects, monitoring, drug-food interactions, safe handling, storage, and disposal.  Patient will take Temodar 140mg  capsules, 2 capsules, (280mg  total daily dose), by mouth once daily, may take at bedtime and on an empty stomach to decrease nausea and vomiting.  If 1st cycle is well tolerated, patient informed that Temodar dose may be increased to 200 mg/m2 daily for 5 days on, 23 days off, repeated every 28 days for subsequent cycles   Patient will take Temodar daily for 5 days on, 23 days off, and repeated.  Temodar start date: 06/03/21 PM   Patient will take Zofran 8mg  tablet, 1 tablet by mouth 30-60 min prior to Temodar dose to help decrease N/V.   Adverse effects include but are not limited to: nausea, vomiting, anorexia, GI upset, rash, drug fever, and fatigue. Rare but serious adverse effects of pneumocystis pneumonia and secondary malignancy also discussed.  We discussed strategies to manage constipation if they occur secondary to ondansetron dosing.  PCP prophylaxis will not be initiated at this time, but may be added based on lymphocyte count in the future.  Reviewed importance of keeping a medication schedule and plan for any missed doses. No barriers to medication adherence identified.  Medication reconciliation performed and medication/allergy list updated.  Insurance authorization for Temodar has been obtained. Test claim at the pharmacy revealed copayment $0 for 1st fill of Temodar. Patient will pick this up from the [facility] on 05/31/21.  Patient informed the pharmacy will reach out 5-7 days prior to needing next fill of Temodar to coordinate continued medication acquisition to prevent break in therapy.  All questions  answered.  Alan Wise voiced understanding and appreciation.   Medication education handout placed in mail for patient. Patient knows to call the office with questions or concerns. Oral Chemotherapy Clinic phone number provided to patient.   Leron Croak, PharmD, BCPS Hematology/Oncology Clinical Pharmacist [facility] [phone] 05/30/2021 11:28 AM

## 2021-05-31 ENCOUNTER — Ambulatory Visit
Admission: RE | Admit: 2021-05-31 | Discharge: 2021-05-31 | Disposition: A | Discharge status: Home / Self Care | Payer: BC Managed Care – PPO | Source: Ambulatory Visit | Attending: Radiation Oncology | Admitting: Radiation Oncology

## 2021-05-31 ENCOUNTER — Other Ambulatory Visit: Payer: Self-pay

## 2021-05-31 ENCOUNTER — Encounter: Payer: Self-pay | Admitting: Radiation Oncology

## 2021-05-31 VITALS — BP 140/91 | HR 72 | Temp 97.4°F | Resp 18 | Ht 68.0 in | Wt 156.0 lb

## 2021-05-31 DIAGNOSIS — C710 Malignant neoplasm of cerebrum, except lobes and ventricles: Secondary | ICD-10-CM

## 2021-05-31 DIAGNOSIS — Z7984 Long term (current) use of oral hypoglycemic drugs: Secondary | ICD-10-CM | POA: Diagnosis not present

## 2021-05-31 DIAGNOSIS — Z923 Personal history of irradiation: Secondary | ICD-10-CM | POA: Insufficient documentation

## 2021-05-31 DIAGNOSIS — C719 Malignant neoplasm of brain, unspecified: Secondary | ICD-10-CM | POA: Diagnosis present

## 2021-05-31 DIAGNOSIS — Z79899 Other long term (current) drug therapy: Secondary | ICD-10-CM | POA: Insufficient documentation

## 2021-05-31 DIAGNOSIS — R410 Disorientation, unspecified: Secondary | ICD-10-CM | POA: Diagnosis not present

## 2021-05-31 NOTE — Progress Notes (Signed)
Radiation Oncology         [phone]) [phone] ________________________________  Name: Alan Wise MRN: [id number]  Date: 05/31/2021  DOB: [birth year]  Follow-Up Visit Note  Outpatient  CC: Alan Fenton, NP  Alan Sellers, MD  Diagnosis and Prior Radiotherapy:    ICD-10-CM   1. 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  Alan Wise presents today for follow-up after completing radiation to his right temporal lobe on 04/30/2021  Dose of Decadron, if applicable: No  Recent neurologic symptoms, if any:  Seizures: No  Headaches: No Nausea: No Dizziness/ataxia: No Difficulty with hand coordination: No Focal numbness/weakness: No Visual deficits/changes: Slightly blurred, chronic, has eye doctor Confusion/Memory deficits: Mild difficulty finding correct words to make a complete sentence.  Additional Complaints / other details: Overall spinal discomfort 2/10.  Last saw medical oncologist Dr. Cecil Wise on 05/28/2021 and will follow-up with him again next month.                               ALLERGIES:  is allergic to contrast media [iodinated diagnostic agents].  Meds: Current Outpatient Medications  Medication Sig Dispense Refill   atorvastatin (LIPITOR) 20 MG tablet Take 1 tablet (20 mg total) by mouth daily. 90 tablet 3   docusate sodium (COLACE) 100 MG capsule Take 1 capsule (100 mg total) by mouth 2 (two) times daily. 60 capsule 2   FREESTYLE LITE test strip USE TWO TIMES DAILY TO TEST BLOOD SUGAR 50 each 11   glipiZIDE (GLUCOTROL) 10 MG tablet TAKE 1 TABLET (10 MG TOTAL) BY MOUTH 2 (TWO) TIMES DAILY BEFORE A MEAL. (Patient taking differently: Take 10 mg by mouth 2 (two) times daily.) 180 tablet 0   JANUVIA 50 MG tablet TAKE 1 TABLET BY  MOUTH EVERY DAY 90 tablet 0   levETIRAcetam (KEPPRA) 500 MG tablet Take 1 tablet (500 mg total) by mouth 2 (two) times daily. 60 tablet 3   ondansetron (ZOFRAN) 8 MG tablet Take 1 tablet (8 mg total) by mouth 2 (two) times daily as needed (nausea and vomiting). May take 30-60 minutes prior to Temodar administration if nausea/vomiting occurs. 30 tablet 1   prednisoLONE acetate (PRED FORTE) 1 % ophthalmic suspension Place 1 drop into both eyes 2 (two) times daily.     temozolomide (TEMODAR) 140 MG capsule Take 2 capsules (280 mg total) by mouth daily for 5 days. Take on Days 1-5 of Cycle 1 of treatment. May take on an empty stomach to decrease nausea & vomiting. 10 capsule 0   No current facility-administered medications for this encounter.    Physical Findings: The patient is in no acute distress. Patient is alert and oriented.  height is 5\' 8"  (1.727 m) and weight is 156 lb (70.8 kg). His temporal temperature is 97.4 F (36.3 C) (abnormal). His blood pressure is 140/91 (abnormal) and his pulse is 72. His respiration is 18 and oxygen saturation is 99%. Marland Kitchen    HEENT: No oral thrush, has partial alopecia. Neuro: Grossly intact.  No obvious focalities.  Coordination intact. MSK: Strength is intact and symmetric.  He is ambulatory  KPS = 90  100 - Normal; no  complaints; no evidence of disease. 90   - Able to carry on normal activity; minor signs or symptoms of disease. 80   - Normal activity with effort; some signs or symptoms of disease. 56   - Cares for self; unable to carry on normal activity or to do active work. 60   - Requires occasional assistance, but is able to care for most of his personal needs. 50   - Requires considerable assistance and frequent medical care. 45   - Disabled; requires special care and assistance. 49   - Severely disabled; hospital admission is indicated although death not imminent. 102   - Very sick; hospital admission necessary; active supportive treatment  necessary. 10   - Moribund; fatal processes progressing rapidly. 0     - Dead  Karnofsky DA, Abelmann [place], Craver LS and Burchenal [facility] [phone]) The use of the nitrogen mustards in the palliative treatment of carcinoma: with particular reference to bronchogenic carcinoma Cancer 1 [phone]   Lab Findings: Lab Results  Component Value Date   WBC 6.9 05/28/2021   HGB 14.9 05/28/2021   HCT 43.2 05/28/2021   MCV 84.0 05/28/2021   PLT 170 05/28/2021    Radiographic Findings: MR BRAIN W WO CONTRAST  Result Date: 05/26/2021 CLINICAL DATA:  Low-grade glioma of cerebrum. Brain/CNS neoplasm, assess treatment response. EXAM: MRI HEAD WITHOUT AND WITH CONTRAST TECHNIQUE: Multiplanar, multiecho pulse sequences of the brain and surrounding structures were obtained without and with intravenous contrast. CONTRAST:  3mL GADAVIST GADOBUTROL 1 MMOL/ML IV SOLN COMPARISON:  Head CT 01/17/2021.  Brain MRI 12/21/2020. FINDINGS: Brain: Biopsy tract within the right frontal operculum/right insula with a small amount of associated chronic blood products. Surrounding expansile T2 FLAIR hyperintense signal abnormality within the right insula/subinsular white matter, right frontal operculum and anterior right temporal lobe. This signal abnormality has decreased in extent, and there is decreased mass effect, as compared to the brain MRI of 12/21/2020. The lesion now measures 3.9 x 3.0 x 3.8 cm (previously 5.1 x 3.5 x 4.7 cm, when measured in similar fashion). There is a subcentimeter focus of diffusion-weighted hyperintensity along the anterolateral aspect of the lesion, without convincing hypointense ADC correlate. There is no abnormal enhancement. No ventricular effacement or midline shift. No cortical encephalomalacia or significant white matter disease is identified elsewhere. No extra-axial fluid collection. Vascular: Maintained flow voids within the proximal large arterial vessels. Skull and upper cervical spine: No focal  suspicious marrow lesion. Sinuses/Orbits: Visualized orbits show no acute finding. Minimal mucosal thickening within the bilateral ethmoid air cells. Other: Trace fluid within the left mastoid air cells. IMPRESSION: Redemonstrated non-enhancing tumor centered within the right insula/subinsular white matter, frontal operculum and anterior temporal lobe. Signal abnormality has decreased in extent, and there is decreased mass effect, as compared to the brain MRI of 12/21/2020. Electronically Signed   By: Kellie Simmering D.O.   On: 05/26/2021 15:07    Impression/Plan:   He is doing very well post radiation and chemotherapy.  Had a good MRI earlier this week.  He will continue to follow with medical oncology.  I will see him back on an as-needed basis.  We spent some time talking about the emotional burden of his disease.  We talked about the option for counseling but at this time he does not wish to pursue that.  He knows to contact us if he changes his mind.  I wished him the very best.  On date of service, in total, I spent 25  minutes on this encounter. Patient was seen in person.  _____________________________________   Eppie Gibson, MD

## 2021-05-31 NOTE — Progress Notes (Signed)
Mr. Sobieski presents today for follow-up after completing radiation to his right temporal lobe on 04/30/2021  Dose of Decadron, if applicable: No  Recent neurologic symptoms, if any:  Seizures: No  Headaches: No Nausea: No Dizziness/ataxia: No Difficulty with hand coordination: No Focal numbness/weakness: No Visual deficits/changes: Slightly blurred Confusion/Memory deficits: Mild difficulty finding correct words to make a complete sentence.  Additional Complaints / other details: Overall spinal discomfort 2/10.  Last saw medical oncologist Dr. Cecil Cobbs on 05/28/2021 and will follow-up with him again next month.

## 2021-06-13 ENCOUNTER — Encounter: Payer: Self-pay | Admitting: Internal Medicine

## 2021-06-13 NOTE — Progress Notes (Signed)
° °                                                                                                                                                          °  Patient Name: Alan Wise MRN: [id number] DOB: [birth year] Referring Physician: Cecil Cobbs Date of Service: 04/30/2021 [facility], [facility]                                                        End Of Treatment Note  Diagnoses: C71.0-Malignant neoplasm of cerebrum, except lobes and ventricles   Intent: Curative  Radiation Treatment Dates: 03/20/2021 through 04/30/2021 Site Technique Total Dose (Gy) Dose per Fx (Gy) Completed Fx Beam Energies  Frontal Lobe: Brain_RFrTemp IMRT 54/54 1.8 30/30 6X   Narrative: The patient tolerated radiation therapy relatively well.   Plan: The patient will follow-up with radiation oncology in 37mo . -----------------------------------  Eppie Gibson, MD

## 2021-06-26 ENCOUNTER — Other Ambulatory Visit: Payer: Self-pay | Admitting: Internal Medicine

## 2021-06-26 NOTE — Telephone Encounter (Signed)
Patient will need to schedule office visit for further refills. Requested Prescriptions  Pending Prescriptions Disp Refills   JANUVIA 50 MG tablet [Pharmacy Med Name: JANUVIA 50 MG TABLET] 90 tablet 0    Sig: TAKE 1 TABLET BY [place]     Endocrinology:  Diabetes - DPP-4 Inhibitors Failed - 06/26/2021  1:23 AM      Failed - HBA1C is between 0 and 7.9 and within 180 days    Hgb A1c MFr Bld  Date Value Ref Range Status  12/05/2020 9.1 (H) <5.7 % of total Hgb Final    Comment:    For someone without known diabetes, a hemoglobin A1c value of 6.5% or greater indicates that they may have  diabetes and this should be confirmed with a follow-up  test. . For someone with known diabetes, a value <7% indicates  that their diabetes is well controlled and a value  greater than or equal to 7% indicates suboptimal  control. A1c targets should be individualized based on  duration of diabetes, age, comorbid conditions, and  other considerations. . Currently, no consensus exists regarding use of hemoglobin A1c for diagnosis of diabetes for children. .          Passed - Cr in normal range and within 360 days    Creatinine  Date Value Ref Range Status  05/28/2021 1.09 0.61 - 1.24 mg/dL Final   Creat  Date Value Ref Range Status  12/05/2020 0.79 0.60 - 1.35 mg/dL Final   Creatinine,U  Date Value Ref Range Status  04/24/2020 113.1 mg/dL Final         Passed - Valid encounter within last 6 months    Recent Outpatient Visits          5 months ago Glioma [facility])   [facility], Coralie Keens, NP   6 months ago Seizure-like activity [facility])   [facility] [place], Coralie Keens, NP

## 2021-06-27 ENCOUNTER — Other Ambulatory Visit: Payer: Self-pay | Admitting: Internal Medicine

## 2021-06-27 ENCOUNTER — Other Ambulatory Visit (HOSPITAL_COMMUNITY): Payer: Self-pay

## 2021-06-27 DIAGNOSIS — C719 Malignant neoplasm of brain, unspecified: Secondary | ICD-10-CM

## 2021-06-27 NOTE — Telephone Encounter (Signed)
Patient has to have labs/md visit prior to refill of oral chemotherapy.  Patient is scheduled on 07/08/2021.

## 2021-07-08 ENCOUNTER — Inpatient Hospital Stay: Payer: BC Managed Care – PPO | Attending: Internal Medicine

## 2021-07-08 ENCOUNTER — Other Ambulatory Visit: Payer: Self-pay

## 2021-07-08 ENCOUNTER — Inpatient Hospital Stay: Payer: BC Managed Care – PPO | Admitting: Internal Medicine

## 2021-07-08 ENCOUNTER — Other Ambulatory Visit (HOSPITAL_COMMUNITY): Payer: Self-pay

## 2021-07-08 VITALS — BP 121/83 | HR 87 | Temp 97.5°F | Resp 18 | Wt 152.6 lb

## 2021-07-08 DIAGNOSIS — C711 Malignant neoplasm of frontal lobe: Secondary | ICD-10-CM | POA: Diagnosis present

## 2021-07-08 DIAGNOSIS — C719 Malignant neoplasm of brain, unspecified: Secondary | ICD-10-CM

## 2021-07-08 DIAGNOSIS — E119 Type 2 diabetes mellitus without complications: Secondary | ICD-10-CM | POA: Diagnosis not present

## 2021-07-08 LAB — CMP (CANCER CENTER ONLY)
ALT: 24 U/L (ref 0–44)
AST: 14 U/L — ABNORMAL LOW (ref 15–41)
Albumin: 4.4 g/dL (ref 3.5–5.0)
Alkaline Phosphatase: 107 U/L (ref 38–126)
Anion gap: 6 (ref 5–15)
BUN: 12 mg/dL (ref 6–20)
CO2: 32 mmol/L (ref 22–32)
Calcium: 9.5 mg/dL (ref 8.9–10.3)
Chloride: 100 mmol/L (ref 98–111)
Creatinine: 0.96 mg/dL (ref 0.61–1.24)
GFR, Estimated: >60 mL/min (ref >60)
Glucose, Bld: 220 mg/dL — ABNORMAL HIGH (ref 70–99)
Potassium: 4 mmol/L (ref 3.5–5.1)
Sodium: 138 mmol/L (ref 135–145)
Total Bilirubin: 0.8 mg/dL (ref 0.3–1.2)
Total Protein: 7.3 g/dL (ref 6.5–8.1)

## 2021-07-08 LAB — CBC WITH DIFFERENTIAL (CANCER CENTER ONLY)
Abs Immature Granulocytes: 0.03 10*3/uL (ref 0.00–0.07)
Basophils Absolute: 0 10*3/uL (ref 0.0–0.1)
Basophils Relative: 0 %
Eosinophils Absolute: 0.2 10*3/uL (ref 0.0–0.5)
Eosinophils Relative: 2 %
HCT: 46.2 % (ref 39.0–52.0)
Hemoglobin: 16.1 g/dL (ref 13.0–17.0)
Immature Granulocytes: 0 %
Lymphocytes Relative: 7 %
Lymphs Abs: 0.7 10*3/uL (ref 0.7–4.0)
MCH: 29.5 pg (ref 26.0–34.0)
MCHC: 34.8 g/dL (ref 30.0–36.0)
MCV: 84.6 fL (ref 80.0–100.0)
Monocytes Absolute: 0.5 10*3/uL (ref 0.1–1.0)
Monocytes Relative: 5 %
Neutro Abs: 7.7 10*3/uL (ref 1.7–7.7)
Neutrophils Relative %: 86 %
Platelet Count: 183 10*3/uL (ref 150–400)
RBC: 5.46 MIL/uL (ref 4.22–5.81)
RDW: 12.6 % (ref 11.5–15.5)
WBC Count: 9.1 10*3/uL (ref 4.0–10.5)
nRBC: 0 % (ref 0.0–0.2)

## 2021-07-08 MED ORDER — TEMOZOLOMIDE 140 MG PO CAPS
150.0000 mg/m2/d | ORAL_CAPSULE | Freq: Every day | ORAL | 0 refills | Status: DC
  Filled 2021-07-08: qty 10, 28d supply, fill #0

## 2021-07-08 NOTE — Progress Notes (Signed)
[facility] [place], [name] [address] [phone]  Interval Evaluation  Date of Service: 07/08/21 Patient Name: Alan Wise Patient MRN: [id number] Patient DOB: 08-26-1970 Provider: Ventura Sellers, MD  Identifying Statement:  ADELBERT Wise is a 51 y.o. male with R frontal oligodendroglioma   Oncology History  Oligodendroglioma ([place])  01/17/2021 Surgery   Stereotactic biopsy with Dr. Marcello Moores; path demonstrates IDH-1 mutant oligodendroglioma   03/20/2021 - 03/20/2021 Chemotherapy   Patient is on Treatment Plan : BRAIN Low Grade Glioma Grade II, Anaplastic Glioma Grade III, Radiation Therapy with Concurrent Temozolomide 66m/m2 Daily Followed by Sequential Maintenance Temozolomide     06/02/2021 -  Chemotherapy   Patient is on Treatment Plan : BRAIN ANAPLASTIC GLIOMA GRADE III Temozolomide Post XRT q28d       Biomarkers:  MGMT Unknown.  IDH 1/2 Mutated.  1p/19q Co-deleted  TERT Unknown   Interval History: Alan Wise today for follow up, now having completed cycle #1 of adjuvant 5-day Temodar.  He had difficulty with fatigue, lethargy and nausea during the week of chemo and for several days after.  Underwent nerve block for occipital neuralgia, which helped modestly, incompletely.  He otherwise denies any new or progressive neurologic deficits.  Work continues to be very stressful.  Occasionally has difficulty finding a word, as prior.  No issues with gait or left sided weakness, no further seizures.  H+P (12/31/20) Patient presented to medical attention this past month, with complaint of involuntary left sided shaking.  He describes episodes, occurring several times per week, of left arm shaking, lasting up to 5 minutes; following the episodes he describes the arm as "heavy" for some time before returning to normal.  He also describes episodes of sudden inability to speak, with strange movements of face  and hands, lasting same amount of time.  These episodes go back ~10 years in very sporadic nature, but have become much more frequent in recent months.  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  These episodes have occurred at work, and have been disruptive in that environment.  He is otherwise fully functional, independent.  Medications: Current Outpatient Medications on File Prior to Visit  Medication Sig Dispense Refill   atorvastatin (LIPITOR) 20 MG tablet Take 1 tablet (20 mg total) by mouth daily. 90 tablet 3   docusate sodium (COLACE) 100 MG capsule Take 1 capsule (100 mg total) by mouth 2 (two) times daily. 60 capsule 2   FREESTYLE LITE test strip USE TWO TIMES DAILY TO TEST BLOOD SUGAR 50 each 11   glipiZIDE (GLUCOTROL) 10 MG tablet TAKE 1 TABLET (10 MG TOTAL) BY MOUTH 2 (TWO) TIMES DAILY BEFORE A MEAL. (Patient taking differently: Take 10 mg by mouth 2 (two) times daily.) 180 tablet 0   JANUVIA 50 MG tablet TAKE 1 TABLET BY MOUTH EVERY DAY 90 tablet 0   levETIRAcetam (KEPPRA) 500 MG tablet Take 1 tablet (500 mg total) by mouth 2 (two) times daily. 60 tablet 3   ondansetron (ZOFRAN) 8 MG tablet Take 1 tablet (8 mg total) by mouth 2 (two) times daily as needed (nausea and vomiting). May take 30-60 minutes prior to Temodar administration if nausea/vomiting occurs. 30 tablet 1   prednisoLONE acetate (PRED FORTE) 1 % ophthalmic suspension Place 1 drop into both eyes 2 (two) times daily.     No current facility-administered medications on file prior to visit.  Allergies:  Allergies  Allergen Reactions   Contrast Media [Iodinated Contrast Media] Swelling   Past Medical History:  Past Medical History:  Diagnosis Date   Allergy    Complication of anesthesia    pt reports he is starting to have more difficulty arousing after surgery   Diabetes mellitus ([place])    GERD (gastroesophageal reflux disease)    Headache    History of kidney stones    Hyperlipidemia     Renal disorder    kidney stones   Past Surgical History:  Past Surgical History:  Procedure Laterality Date   APPLICATION OF CRANIAL NAVIGATION Right 01/17/2021   Procedure: APPLICATION OF CRANIAL NAVIGATION;  Surgeon: Vallarie Mare, MD;  Location: [facility];  Service: Neurosurgery;  Laterality: Right;   COLONOSCOPY  09/03/1994   CYSTOSCOPY WITH STENT PLACEMENT Right 01/25/2016   Procedure: CYSTOSCOPY WITH STENT PLACEMENT;  Surgeon: Nickie Retort, MD;  Location: [facility];  Service: Urology;  Laterality: Right;   FRAMELESS  BIOPSY WITH BRAINLAB Right 01/17/2021   Procedure: RIGHT STEREOTACTIC BIOPSY OF INSULAR LESION;  Surgeon: Vallarie Mare, MD;  Location: [facility];  Service: Neurosurgery;  Laterality: Right;   KNEE ARTHROSCOPY W/ MENISCAL REPAIR Right 06/23/1988   removal of birthmark  06/08/2013   SKIN CANCER EXCISION  06/23/2012   TYMPANOSTOMY TUBE PLACEMENT  08/06/1978   URETEROSCOPY WITH HOLMIUM LASER LITHOTRIPSY Right 01/25/2016   Procedure: URETEROSCOPY WITH HOLMIUM LASER LITHOTRIPSY;  Surgeon: Nickie Retort, MD;  Location: [facility];  Service: Urology;  Laterality: Right;   URETHRAL STRICTURE DILATATION     visual inspection of vocal cord     1973   WISDOM TOOTH EXTRACTION     Social History:  Social History   Socioeconomic History   Marital status: Married    Spouse name: Not on file   Number of children: Not on file   Years of education: Not on file   Highest education level: Not on file  Occupational History   Not on file  Tobacco Use   Smoking status: Never   Smokeless tobacco: Never  Vaping Use   Vaping Use: Never used  Substance and Sexual Activity   Alcohol use: Not Currently    Comment: rare   Drug use: No   Sexual activity: Not on file  Other Topics Concern   Not on file  Social History Narrative   Not on file   Social Determinants of Health   Financial Resource Strain: Not on file  Food Insecurity: Not on file  Transportation Needs:  Not on file  Physical Activity: Not on file  Stress: Not on file  Social Connections: Not on file  Intimate Partner Violence: Not on file   Family History:  Family History  Problem Relation Age of Onset   Hyperlipidemia Father    Hypertension Father    Diabetes Father    Benign prostatic hyperplasia Father    Stroke Maternal Grandmother    Diabetes Maternal Grandmother    Stroke Paternal Grandmother    Hypertension Paternal Grandmother    Kidney disease Neg Hx    Prostate cancer Neg Hx     Review of Systems: Constitutional: Doesn't report fevers, chills or abnormal weight loss Eyes: Doesn't report blurriness of vision Ears, nose, mouth, throat, and face: Doesn't report sore throat Respiratory: Doesn't report cough, dyspnea or wheezes Cardiovascular: Doesn't report palpitation, chest discomfort  Gastrointestinal:  Doesn't report nausea, constipation, diarrhea GU: Doesn't report incontinence Skin: Doesn't report skin rashes  Neurological: Per HPI Musculoskeletal: Doesn't report joint pain Behavioral/Psych: Doesn't report anxiety  Physical Exam: Vitals:   07/08/21 1204  BP: 121/83  Pulse: 87  Resp: 18  Temp: (!) 97.5 F (36.4 C)  SpO2: 99%   KPS: 80. General: Fatigued Head: Normal EENT: No conjunctival injection or scleral icterus.  Lungs: Resp effort normal Cardiac: Regular rate Abdomen: Non-distended abdomen Skin: No rashes cyanosis or petechiae. Extremities: No clubbing or edema  Neurologic Exam: Mental Status: Awake, alert, attentive to examiner. Oriented to self and environment. Language is fluent with intact comprehension.  Modest psychomotor slowing, impaired 3 object recall Cranial Nerves: Visual acuity is grossly normal. Visual fields are full. Extra-ocular movements intact. No ptosis. Face is symmetric Motor: Tone and bulk are normal. Power is full in both arms and legs. Reflexes are symmetric, no pathologic reflexes present.  Sensory: Intact to light  touch Gait: Normal.   Labs: I have reviewed the data as listed    Component Value Date/Time   NA 142 05/28/2021 0820   NA 138 05/04/2012 1815   K 4.4 05/28/2021 0820   K 3.5 05/04/2012 1815   CL 103 05/28/2021 0820   CL 99 05/04/2012 1815   CO2 29 05/28/2021 0820   CO2 29 05/04/2012 1815   GLUCOSE 202 (H) 05/28/2021 0820   GLUCOSE 69 05/04/2012 1815   BUN 12 05/28/2021 0820   BUN 12 05/04/2012 1815   CREATININE 1.09 05/28/2021 0820   CREATININE 0.79 12/05/2020 1014   CALCIUM 9.2 05/28/2021 0820   CALCIUM 9.2 05/04/2012 1815   PROT 7.3 05/28/2021 0820   PROT 8.5 (H) 05/04/2012 1815   ALBUMIN 4.2 05/28/2021 0820   ALBUMIN 4.5 05/04/2012 1815   AST 21 05/28/2021 0820   ALT 37 05/28/2021 0820   ALT 77 05/04/2012 1815   ALKPHOS 101 05/28/2021 0820   ALKPHOS 129 05/04/2012 1815   BILITOT 0.5 05/28/2021 0820   GFRNONAA >60 05/28/2021 0820   GFRNONAA 106 12/05/2020 1014   GFRAA 123 12/05/2020 1014   Lab Results  Component Value Date   WBC 6.9 05/28/2021   NEUTROABS 5.3 05/28/2021   HGB 14.9 05/28/2021   HCT 43.2 05/28/2021   MCV 84.0 05/28/2021   PLT 170 05/28/2021     Assessment/Plan Oligodendroglioma (HCC)  Alan Wise is clinically stable today, now having completed first cycle of 5-day Temodar.  He had some issues tolerating the chemo, so we will not recommend dose escalation.  We recommended continuing treatment with cycle #2 Temozolomide, still at 159m/m2, on for five days and off for twenty three days in twenty eight day cycles. The patient will have a complete blood count performed on days 21 and 28 of each cycle, and a comprehensive metabolic panel performed on day 28 of each cycle. Labs may need to be performed more often. Zofran will prescribed for home use for nausea/vomiting.   Chemotherapy should be held for the following:  ANC less than 1,000  Platelets less than 100,000  LFT or creatinine greater than 2x ULN  If clinical  concerns/contraindications develop  Will con't Keppra [id number], he is seizure free currently.  He is not interested in trial of neuropathic pain agent for occipital neuralgia.  We ask that Alan FERRUFINOeturn to clinic in 1 months prior to cycle #3 with labs for evaluation.  Next MRI brain can be in 2 months, prior to cycle #4.  All questions were answered. The patient knows to call the clinic with any  problems, questions or concerns. No barriers to learning were detected.  The total time spent in the encounter was 30 minutes and more than 50% was on counseling and review of test results   Ventura Sellers, MD Medical Director of Neuro-Oncology [facility] [place] 07/08/21 11:55 AM

## 2021-07-09 ENCOUNTER — Telehealth: Payer: Self-pay | Admitting: Internal Medicine

## 2021-07-09 NOTE — Telephone Encounter (Signed)
Scheduled follow-up appointment per 1/16 los. Patient is aware.

## 2021-07-24 ENCOUNTER — Other Ambulatory Visit (HOSPITAL_COMMUNITY): Payer: Self-pay

## 2021-08-05 ENCOUNTER — Other Ambulatory Visit (HOSPITAL_COMMUNITY): Payer: Self-pay

## 2021-08-05 ENCOUNTER — Inpatient Hospital Stay: Payer: BC Managed Care – PPO

## 2021-08-05 ENCOUNTER — Inpatient Hospital Stay: Payer: BC Managed Care – PPO | Attending: Internal Medicine | Admitting: Internal Medicine

## 2021-08-05 ENCOUNTER — Other Ambulatory Visit: Payer: Self-pay

## 2021-08-05 VITALS — BP 131/91 | HR 98 | Temp 98.4°F | Resp 17 | Wt 150.2 lb

## 2021-08-05 DIAGNOSIS — R5383 Other fatigue: Secondary | ICD-10-CM | POA: Insufficient documentation

## 2021-08-05 DIAGNOSIS — R112 Nausea with vomiting, unspecified: Secondary | ICD-10-CM | POA: Diagnosis not present

## 2021-08-05 DIAGNOSIS — E119 Type 2 diabetes mellitus without complications: Secondary | ICD-10-CM | POA: Insufficient documentation

## 2021-08-05 DIAGNOSIS — C711 Malignant neoplasm of frontal lobe: Secondary | ICD-10-CM | POA: Insufficient documentation

## 2021-08-05 DIAGNOSIS — C710 Malignant neoplasm of cerebrum, except lobes and ventricles: Secondary | ICD-10-CM

## 2021-08-05 DIAGNOSIS — C719 Malignant neoplasm of brain, unspecified: Secondary | ICD-10-CM

## 2021-08-05 DIAGNOSIS — R569 Unspecified convulsions: Secondary | ICD-10-CM | POA: Diagnosis not present

## 2021-08-05 LAB — CBC WITH DIFFERENTIAL (CANCER CENTER ONLY)
Abs Immature Granulocytes: 0.03 10*3/uL (ref 0.00–0.07)
Basophils Absolute: 0.1 10*3/uL (ref 0.0–0.1)
Basophils Relative: 1 %
Eosinophils Absolute: 0.2 10*3/uL (ref 0.0–0.5)
Eosinophils Relative: 2 %
HCT: 43 % (ref 39.0–52.0)
Hemoglobin: 14.7 g/dL (ref 13.0–17.0)
Immature Granulocytes: 0 %
Lymphocytes Relative: 11 %
Lymphs Abs: 0.9 10*3/uL (ref 0.7–4.0)
MCH: 29.1 pg (ref 26.0–34.0)
MCHC: 34.2 g/dL (ref 30.0–36.0)
MCV: 85 fL (ref 80.0–100.0)
Monocytes Absolute: 0.6 10*3/uL (ref 0.1–1.0)
Monocytes Relative: 7 %
Neutro Abs: 6.7 10*3/uL (ref 1.7–7.7)
Neutrophils Relative %: 79 %
Platelet Count: 220 10*3/uL (ref 150–400)
RBC: 5.06 MIL/uL (ref 4.22–5.81)
RDW: 12.7 % (ref 11.5–15.5)
WBC Count: 8.5 10*3/uL (ref 4.0–10.5)
nRBC: 0 % (ref 0.0–0.2)

## 2021-08-05 LAB — CMP (CANCER CENTER ONLY)
ALT: 29 U/L (ref 0–44)
AST: 15 U/L (ref 15–41)
Albumin: 4.3 g/dL (ref 3.5–5.0)
Alkaline Phosphatase: 101 U/L (ref 38–126)
Anion gap: 5 (ref 5–15)
BUN: 12 mg/dL (ref 6–20)
CO2: 33 mmol/L — ABNORMAL HIGH (ref 22–32)
Calcium: 9.4 mg/dL (ref 8.9–10.3)
Chloride: 99 mmol/L (ref 98–111)
Creatinine: 0.8 mg/dL (ref 0.61–1.24)
GFR, Estimated: >60 mL/min (ref >60)
Glucose, Bld: 221 mg/dL — ABNORMAL HIGH (ref 70–99)
Potassium: 4 mmol/L (ref 3.5–5.1)
Sodium: 137 mmol/L (ref 135–145)
Total Bilirubin: 0.5 mg/dL (ref 0.3–1.2)
Total Protein: 7.2 g/dL (ref 6.5–8.1)

## 2021-08-05 MED ORDER — TEMOZOLOMIDE 140 MG PO CAPS
150.0000 mg/m2/d | ORAL_CAPSULE | Freq: Every day | ORAL | 0 refills | Status: AC
  Filled 2021-08-05: qty 10, 28d supply, fill #0

## 2021-08-05 MED ORDER — TRAZODONE HCL 50 MG PO TABS: 50.0000 mg | ORAL_TABLET | Freq: Every day | ORAL | 1 refills | Status: DC

## 2021-08-05 NOTE — Progress Notes (Signed)
[facility] at [address], [place] [address] [phone]  Interval Evaluation  Date of Service: 08/05/21 Patient Name: Alan Wise Patient MRN: [id number] Patient DOB: 1970/08/10 Provider: Ventura Sellers, MD  Identifying Statement:  Alan Wise is a 51 y.o. male with R frontal oligodendroglioma   Oncology History  Oligodendroglioma ([place])  01/17/2021 Surgery   Stereotactic biopsy with Dr. Marcello Moores; path demonstrates IDH-1 mutant oligodendroglioma   03/20/2021 - 03/20/2021 Chemotherapy   Patient is on Treatment Plan : BRAIN Low Grade Glioma Grade II, Anaplastic Glioma Grade III, Radiation Therapy with Concurrent Temozolomide 74m/m2 Daily Followed by Sequential Maintenance Temozolomide     06/02/2021 -  Chemotherapy   Patient is on Treatment Plan : BRAIN ANAPLASTIC GLIOMA GRADE III Temozolomide Post XRT q28d       Biomarkers:  MGMT Unknown.  IDH 1/2 Mutated.  1p/19q Co-deleted  TERT Unknown   Interval History: Alan GUARDIApresents today for follow up, now having completed cycle #2 of adjuvant 5-day Temodar.  He continued to experience difficulty with fatigue, lethargy and nausea during the week of chemo and for several days after.  Underwent further nerve block for occipital neuralgia.  Today he describes issues with his sleep.  He otherwise denies any new or progressive neurologic deficits.  Work continues to be very stressful.  Occasionally has difficulty finding a word, as prior.  No issues with gait or left sided weakness, no further seizures.  H+P (12/31/20) Patient presented to medical attention this past month, with complaint of involuntary left sided shaking.  He describes episodes, occurring several times per week, of left arm shaking, lasting up to 5 minutes; following the episodes he describes the arm as "heavy" for some time before returning to normal.  He also describes episodes of sudden inability to  speak, with strange movements of face and hands, lasting same amount of time.  These episodes go back ~10 years in very sporadic nature, but have become much more frequent in recent months.  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  These episodes have occurred at work, and have been disruptive in that environment.  He is otherwise fully functional, independent.  Medications: Current Outpatient Medications on File Prior to Visit  Medication Sig Dispense Refill   atorvastatin (LIPITOR) 20 MG tablet Take 1 tablet (20 mg total) by mouth daily. 90 tablet 3   docusate sodium (COLACE) 100 MG capsule Take 1 capsule (100 mg total) by mouth 2 (two) times daily. 60 capsule 2   FREESTYLE LITE test strip USE TWO TIMES DAILY TO TEST BLOOD SUGAR 50 each 11   glipiZIDE (GLUCOTROL) 10 MG tablet TAKE 1 TABLET (10 MG TOTAL) BY MOUTH 2 (TWO) TIMES DAILY BEFORE A MEAL. (Patient taking differently: Take 10 mg by mouth 2 (two) times daily.) 180 tablet 0   JANUVIA 50 MG tablet TAKE 1 TABLET BY MOUTH EVERY DAY 90 tablet 0   levETIRAcetam (KEPPRA) 500 MG tablet Take 1 tablet (500 mg total) by mouth 2 (two) times daily. 60 tablet 3   ondansetron (ZOFRAN) 8 MG tablet Take 1 tablet (8 mg total) by mouth 2 (two) times daily as needed (nausea and vomiting). May take 30-60 minutes prior to Temodar administration if nausea/vomiting occurs. 30 tablet 1   prednisoLONE acetate (PRED FORTE) 1 % ophthalmic suspension Place 1 drop into both eyes 2 (two) times daily.     temozolomide (TEMODAR) 140 MG  capsule Take 2 capsules (280 mg total) by mouth daily for 5 days. Take on Days 1-5 of Cycle 1 of treatment. May take on an empty stomach to decrease nausea & vomiting. 10 capsule 0   No current facility-administered medications on file prior to visit.    Allergies:  Allergies  Allergen Reactions   Contrast Media [Iodinated Contrast Media] Swelling   Past Medical History:  Past Medical History:  Diagnosis Date    Allergy    Complication of anesthesia    pt reports he is starting to have more difficulty arousing after surgery   Diabetes mellitus ([place])    GERD (gastroesophageal reflux disease)    Headache    History of kidney stones    Hyperlipidemia    Renal disorder    kidney stones   Past Surgical History:  Past Surgical History:  Procedure Laterality Date   APPLICATION OF CRANIAL NAVIGATION Right 01/17/2021   Procedure: APPLICATION OF CRANIAL NAVIGATION;  Surgeon: Vallarie Mare, MD;  Location: [facility];  Service: Neurosurgery;  Laterality: Right;   COLONOSCOPY  09/03/1994   CYSTOSCOPY WITH STENT PLACEMENT Right 01/25/2016   Procedure: CYSTOSCOPY WITH STENT PLACEMENT;  Surgeon: Nickie Retort, MD;  Location: [facility];  Service: Urology;  Laterality: Right;   FRAMELESS  BIOPSY WITH BRAINLAB Right 01/17/2021   Procedure: RIGHT STEREOTACTIC BIOPSY OF INSULAR LESION;  Surgeon: Vallarie Mare, MD;  Location: [facility];  Service: Neurosurgery;  Laterality: Right;   KNEE ARTHROSCOPY W/ MENISCAL REPAIR Right 06/23/1988   removal of birthmark  06/08/2013   SKIN CANCER EXCISION  06/23/2012   TYMPANOSTOMY TUBE PLACEMENT  08/06/1978   URETEROSCOPY WITH HOLMIUM LASER LITHOTRIPSY Right 01/25/2016   Procedure: URETEROSCOPY WITH HOLMIUM LASER LITHOTRIPSY;  Surgeon: Nickie Retort, MD;  Location: [facility];  Service: Urology;  Laterality: Right;   URETHRAL STRICTURE DILATATION     visual inspection of vocal cord     1973   WISDOM TOOTH EXTRACTION     Social History:  Social History   Socioeconomic History   Marital status: Married    Spouse name: Not on file   Number of children: Not on file   Years of education: Not on file   Highest education level: Not on file  Occupational History   Not on file  Tobacco Use   Smoking status: Never   Smokeless tobacco: Never  Vaping Use   Vaping Use: Never used  Substance and Sexual Activity   Alcohol use: Not Currently    Comment: rare   Drug  use: No   Sexual activity: Not on file  Other Topics Concern   Not on file  Social History Narrative   Not on file   Social Determinants of Health   Financial Resource Strain: Not on file  Food Insecurity: Not on file  Transportation Needs: Not on file  Physical Activity: Not on file  Stress: Not on file  Social Connections: Not on file  Intimate Partner Violence: Not on file   Family History:  Family History  Problem Relation Age of Onset   Hyperlipidemia Father    Hypertension Father    Diabetes Father    Benign prostatic hyperplasia Father    Stroke Maternal Grandmother    Diabetes Maternal Grandmother    Stroke Paternal Grandmother    Hypertension Paternal Grandmother    Kidney disease Neg Hx    Prostate cancer Neg Hx     Review of Systems: Constitutional: Doesn't report fevers, chills  or abnormal weight loss Eyes: Doesn't report blurriness of vision Ears, nose, mouth, throat, and face: Doesn't report sore throat Respiratory: Doesn't report cough, dyspnea or wheezes Cardiovascular: Doesn't report palpitation, chest discomfort  Gastrointestinal:  Doesn't report nausea, constipation, diarrhea GU: Doesn't report incontinence Skin: Doesn't report skin rashes Neurological: Per HPI Musculoskeletal: Doesn't report joint pain Behavioral/Psych: Doesn't report anxiety  Physical Exam: Vitals:   08/05/21 1155  BP: (!) 131/91  Pulse: 98  Resp: 17  Temp: 98.4 F (36.9 C)  SpO2: 98%   KPS: 80. General: Fatigued Head: Normal EENT: No conjunctival injection or scleral icterus.  Lungs: Resp effort normal Cardiac: Regular rate Abdomen: Non-distended abdomen Skin: No rashes cyanosis or petechiae. Extremities: No clubbing or edema  Neurologic Exam: Mental Status: Awake, alert, attentive to examiner. Oriented to self and environment. Language is fluent with intact comprehension.  Modest psychomotor slowing, impaired 3 object recall Cranial Nerves: Visual acuity is  grossly normal. Visual fields are full. Extra-ocular movements intact. No ptosis. Face is symmetric Motor: Tone and bulk are normal. Power is full in both arms and legs. Reflexes are symmetric, no pathologic reflexes present.  Sensory: Intact to light touch Gait: Normal.   Labs: I have reviewed the data as listed    Component Value Date/Time   NA 138 07/08/2021 1147   NA 138 05/04/2012 1815   K 4.0 07/08/2021 1147   K 3.5 05/04/2012 1815   CL 100 07/08/2021 1147   CL 99 05/04/2012 1815   CO2 32 07/08/2021 1147   CO2 29 05/04/2012 1815   GLUCOSE 220 (H) 07/08/2021 1147   GLUCOSE 69 05/04/2012 1815   BUN 12 07/08/2021 1147   BUN 12 05/04/2012 1815   CREATININE 0.96 07/08/2021 1147   CREATININE 0.79 12/05/2020 1014   CALCIUM 9.5 07/08/2021 1147   CALCIUM 9.2 05/04/2012 1815   PROT 7.3 07/08/2021 1147   PROT 8.5 (H) 05/04/2012 1815   ALBUMIN 4.4 07/08/2021 1147   ALBUMIN 4.5 05/04/2012 1815   AST 14 (L) 07/08/2021 1147   ALT 24 07/08/2021 1147   ALT 77 05/04/2012 1815   ALKPHOS 107 07/08/2021 1147   ALKPHOS 129 05/04/2012 1815   BILITOT 0.8 07/08/2021 1147   GFRNONAA >60 07/08/2021 1147   GFRNONAA 106 12/05/2020 1014   GFRAA 123 12/05/2020 1014   Lab Results  Component Value Date   WBC 8.5 08/05/2021   NEUTROABS 6.7 08/05/2021   HGB 14.7 08/05/2021   HCT 43.0 08/05/2021   MCV 85.0 08/05/2021   PLT 220 08/05/2021     Assessment/Plan Low grade glioma of cerebrum (HCC)  Focal seizures (HCC)  Alan Wise is clinically stable today, now having completed cycle #2 of 5-day Temodar.  Nausea and fatigue were ongoing this cycle, at times interfering with work.    We recommended continuing treatment with cycle #3 Temozolomide, still at 18m/m2, on for five days and off for twenty three days in twenty eight day cycles. The patient will have a complete blood count performed on days 21 and 28 of each cycle, and a comprehensive metabolic panel performed on day 28 of each  cycle. Labs may need to be performed more often. Zofran will prescribed for home use for nausea/vomiting.   Start of cycle #3 will be delayed two weeks until his one week break from work (spring break).  Chemotherapy should be held for the following:  ANC less than 1,000  Platelets less than 100,000  LFT or creatinine greater than 2x ULN  If clinical concerns/contraindications develop  Will con't Keppra 552m BID, he is seizure free currently.  Will recommend trial of Trazodone 551mHS for sleep issues.  We ask that JoCLAYTON BOSSERMANeturn to clinic in 1 months prior to cycle #4 with MRI brain for evaluation.   All questions were answered. The patient knows to call the clinic with any problems, questions or concerns. No barriers to learning were detected.  The total time spent in the encounter was 30 minutes and more than 50% was on counseling and review of test results   ZaVentura SellersMD Medical Director of Neuro-Oncology [facility]2/13/23 11:55 AM

## 2021-08-06 ENCOUNTER — Telehealth: Payer: Self-pay | Admitting: Internal Medicine

## 2021-08-06 NOTE — Telephone Encounter (Signed)
Scheduled per 2/13 los, message has been left with pt

## 2021-08-19 ENCOUNTER — Other Ambulatory Visit (HOSPITAL_COMMUNITY): Payer: Self-pay

## 2021-08-19 ENCOUNTER — Telehealth: Payer: Self-pay | Admitting: *Deleted

## 2021-08-19 NOTE — Telephone Encounter (Signed)
Alan Wise with Alan Wise in [place] needed clearance for patient to have dental cleaning considering he is on chemotherapy.  Per Dr Mickeal Skinner this is ok and he has no objections.  Notified patient and [facility].

## 2021-08-20 ENCOUNTER — Other Ambulatory Visit: Payer: Self-pay | Admitting: Radiation Therapy

## 2021-08-20 ENCOUNTER — Telehealth: Payer: Self-pay | Admitting: *Deleted

## 2021-08-20 NOTE — Telephone Encounter (Signed)
Fax to [employer] failed.  Emailing dental clearance to [email or web address]

## 2021-08-20 NOTE — Telephone Encounter (Signed)
Dental Clearance letter sent to [facility] via fax [phone].

## 2021-09-06 ENCOUNTER — Other Ambulatory Visit (HOSPITAL_COMMUNITY): Payer: Self-pay

## 2021-09-12 ENCOUNTER — Ambulatory Visit (HOSPITAL_COMMUNITY)
Admission: RE | Admit: 2021-09-12 | Discharge: 2021-09-12 | Disposition: A | Discharge status: Home / Self Care | Payer: BC Managed Care – PPO | Source: Ambulatory Visit | Attending: Internal Medicine | Admitting: Internal Medicine

## 2021-09-12 DIAGNOSIS — C710 Malignant neoplasm of cerebrum, except lobes and ventricles: Secondary | ICD-10-CM | POA: Diagnosis present

## 2021-09-12 IMAGING — MR MR HEAD WO/W CM
13 series · 48 of 48 positions shown · IV contrast (gadavist)
Comparison: MRI head [DATE]

CLINICAL DATA: Low-grade glioma of cerebrum. Assess treatment
response

EXAM:
MRI HEAD WITHOUT AND WITH CONTRAST
TECHNIQUE: Multiplanar, multiecho pulse sequences of the brain and surrounding
structures were obtained without and with intravenous contrast.
CONTRAST:  7mL GADAVIST GADOBUTROL 1 MMOL/ML IV SOLN

[Series 5: DWI · axial · 3.0mm · 1.36mm/px · z∈[-32,+119]mm · 6 of 104 slices shown (1 of 2)]
[im 1/104]
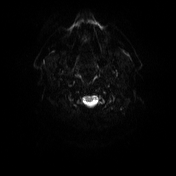
[im 21/104]
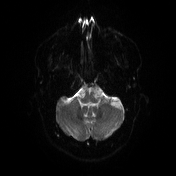
[im 42/104]
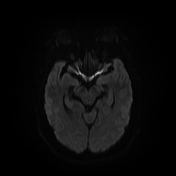
[im 62/104]
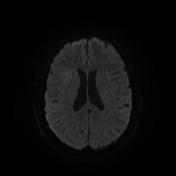
[im 83/104]
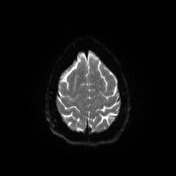
[im 104/104]
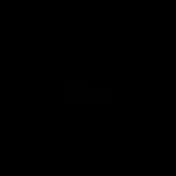

[Series 6: DWI · axial · 3.0mm · 1.36mm/px · z∈[-32,+116]mm · 3 of 51 slices shown (2 of 2)]
[im 1/51]
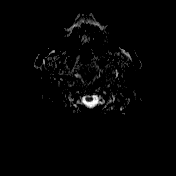
[im 26/51]
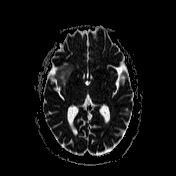
[im 51/51]
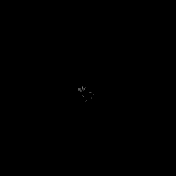

[Series 7: T1 · sagittal · 5.0mm · 0.75mm/px · 1 of 24 slices shown (1 of 2)]
[im 1/24]
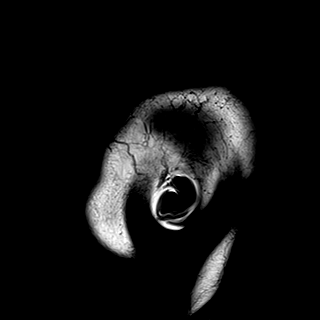

[Series 8: T2 · axial · 5.0mm · 0.62mm/px · z∈[-40,+122]mm · 2 of 26 slices shown]
[im 1/26]
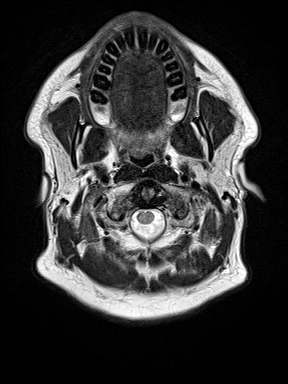
[im 26/26]
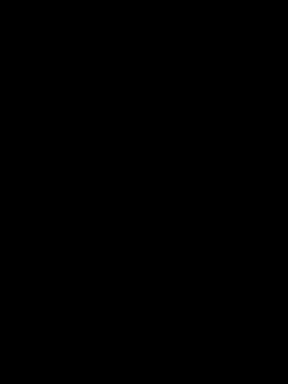

[Series 9: swi_images · axial · 3.0mm · 0.75mm/px · z∈[-41,+123]mm · 3 of 56 slices shown]
[im 1/56]
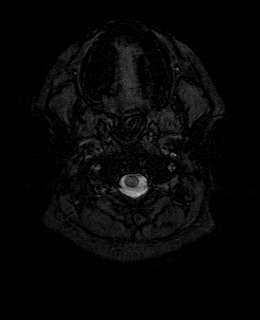
[im 28/56]
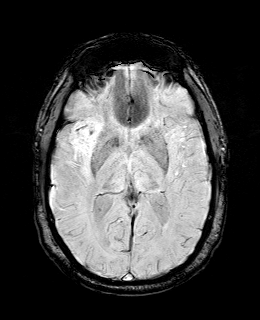
[im 56/56]
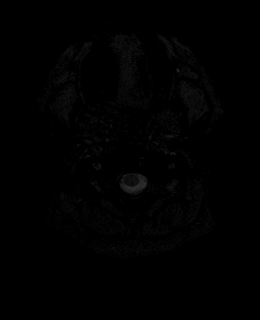

[Series 11: FLAIR · axial · 3.0mm · 0.75mm/px · z∈[-38,+120]mm · 3 of 54 slices shown]
[im 1/54]
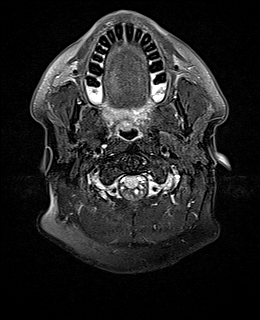
[im 27/54]
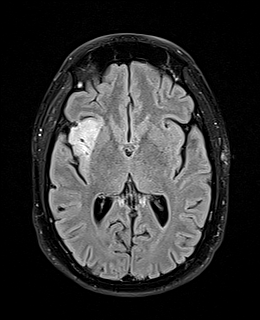
[im 54/54]
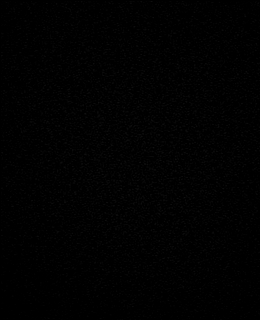

[Series 12: T1 · axial · 1.0mm · 0.94mm/px · z∈[-40,+117]mm · 10 of 159 slices shown (2 of 2)]
[im 1/159]
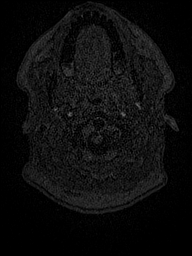
[im 18/159]
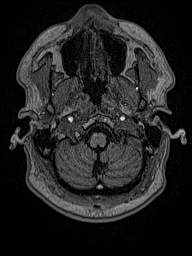
[im 36/159]
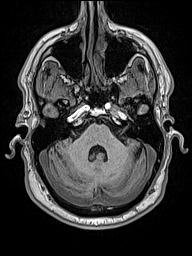
[im 53/159]
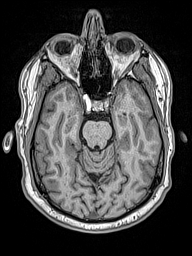
[im 71/159]
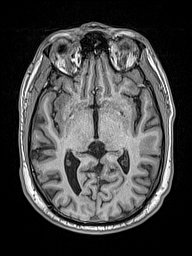
[im 88/159]
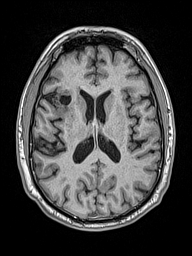
[im 106/159]
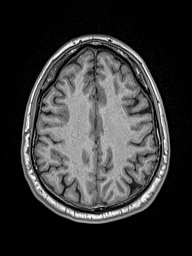
[im 123/159]
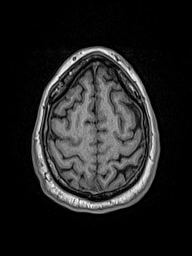
[im 141/159]
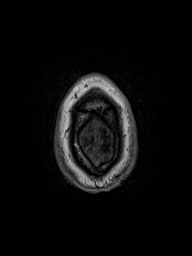
[im 159/159]
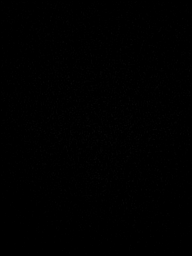

[Series 13: cor dwi_tracew · coronal · 5.0mm · 1.53mm/px · 3 of 56 slices shown]
[im 1/56]
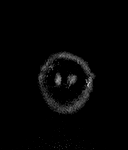
[im 28/56]
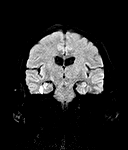
[im 56/56]
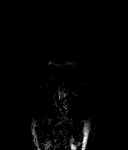

[Series 14: cor dwi_adc · coronal · 5.0mm · 1.53mm/px · 2 of 28 slices shown]
[im 1/28]
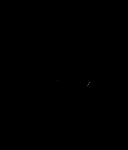
[im 28/28]
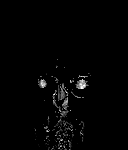

[Series 15: T2 post-contrast · coronal · 5.0mm · 0.57mm/px · 2 of 29 slices shown]
[im 1/29]
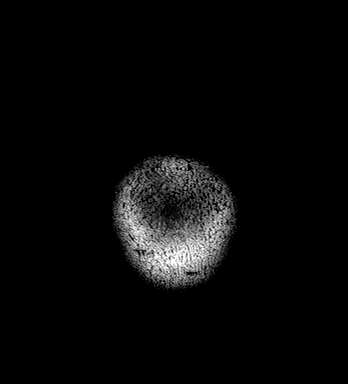
[im 29/29]
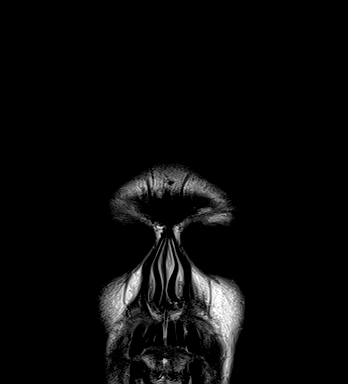

[Series 16: T1 post-contrast · axial · 1.0mm · 0.94mm/px · z∈[-40,+118]mm · 10 of 160 slices shown (1 of 3)]
[im 1/160]
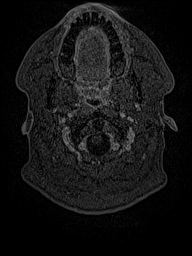
[im 18/160]
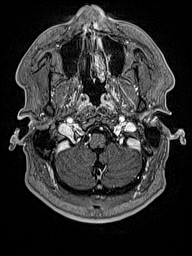
[im 36/160]
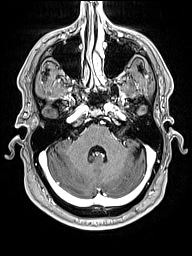
[im 54/160]
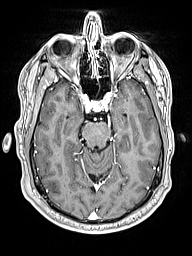
[im 71/160]
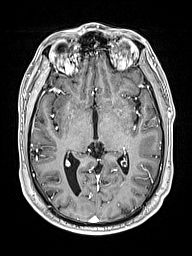
[im 89/160]
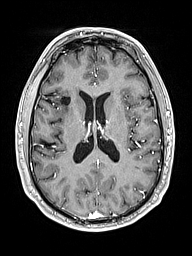
[im 107/160]
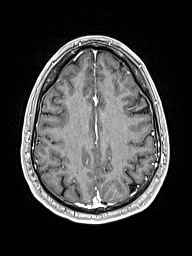
[im 124/160]
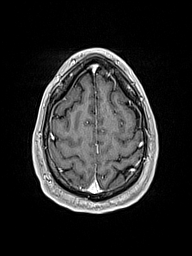
[im 142/160]
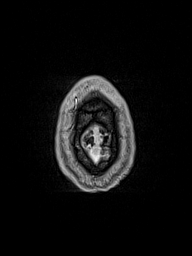
[im 160/160]
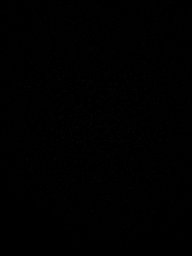

[Series 17: T1 post-contrast · coronal · 5.0mm · 0.43mm/px · 2 of 29 slices shown (2 of 3)]
[im 1/29]
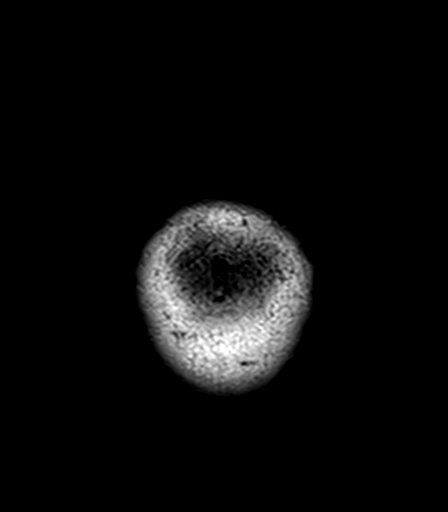
[im 29/29]
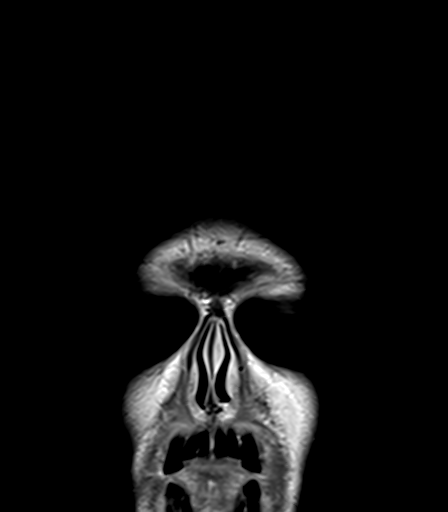

[Series 18: T1 post-contrast · sagittal · 5.0mm · 0.75mm/px · 1 of 24 slices shown (3 of 3)]
[im 1/24]
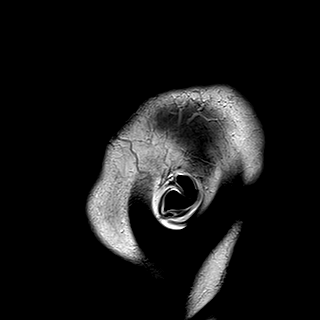

[48 of 48 positions shown; findings below may reference images not displayed]

FINDINGS: Brain: Stable lesion in the right insula and superior operculum with
hyperintensity on T2 and FLAIR. Cystic change in the anterior tumor
is unchanged likely due to biopsy. Small amount of chronic blood
products from biopsy. On axial FLAIR images, the lesion measures
approximately 36 x 20 mm and is stable from the prior study. No
abnormal enhancement. No mass-effect or midline shift.

No other lesions in the brain. Ventricle size normal. No acute
infarct.

Vascular: Normal arterial flow voids

Skull and upper cervical spine: No focal skeletal lesion

Sinuses/Orbits: Negative

Other: None
IMPRESSION: Stable mass lesion in the right insula and superior operculum
compatible low-grade glioma. No abnormal enhancement. Stable lesion
size.

## 2021-09-12 MED ORDER — GADOBUTROL 1 MMOL/ML IV SOLN
7.0000 mL | Freq: Once | INTRAVENOUS | Status: AC | PRN
  Administered 2021-09-12: 7 mL via INTRAVENOUS

## 2021-09-16 ENCOUNTER — Inpatient Hospital Stay: Payer: BC Managed Care – PPO | Attending: Internal Medicine

## 2021-09-19 ENCOUNTER — Ambulatory Visit (HOSPITAL_COMMUNITY): Payer: BC Managed Care – PPO

## 2021-09-23 ENCOUNTER — Other Ambulatory Visit: Payer: Self-pay

## 2021-09-23 ENCOUNTER — Inpatient Hospital Stay: Payer: BC Managed Care – PPO | Attending: Internal Medicine

## 2021-09-23 ENCOUNTER — Inpatient Hospital Stay (HOSPITAL_BASED_OUTPATIENT_CLINIC_OR_DEPARTMENT_OTHER): Payer: BC Managed Care – PPO | Admitting: Internal Medicine

## 2021-09-23 ENCOUNTER — Other Ambulatory Visit (HOSPITAL_COMMUNITY): Payer: Self-pay

## 2021-09-23 VITALS — BP 137/80 | HR 73 | Temp 96.8°F | Resp 17 | Wt 154.4 lb

## 2021-09-23 DIAGNOSIS — C719 Malignant neoplasm of brain, unspecified: Secondary | ICD-10-CM

## 2021-09-23 DIAGNOSIS — R569 Unspecified convulsions: Secondary | ICD-10-CM | POA: Diagnosis not present

## 2021-09-23 DIAGNOSIS — Z79899 Other long term (current) drug therapy: Secondary | ICD-10-CM | POA: Diagnosis not present

## 2021-09-23 DIAGNOSIS — K219 Gastro-esophageal reflux disease without esophagitis: Secondary | ICD-10-CM | POA: Insufficient documentation

## 2021-09-23 DIAGNOSIS — E119 Type 2 diabetes mellitus without complications: Secondary | ICD-10-CM | POA: Diagnosis not present

## 2021-09-23 DIAGNOSIS — Z923 Personal history of irradiation: Secondary | ICD-10-CM | POA: Diagnosis not present

## 2021-09-23 DIAGNOSIS — C710 Malignant neoplasm of cerebrum, except lobes and ventricles: Secondary | ICD-10-CM

## 2021-09-23 DIAGNOSIS — R5383 Other fatigue: Secondary | ICD-10-CM | POA: Diagnosis not present

## 2021-09-23 DIAGNOSIS — Z87442 Personal history of urinary calculi: Secondary | ICD-10-CM | POA: Insufficient documentation

## 2021-09-23 DIAGNOSIS — R112 Nausea with vomiting, unspecified: Secondary | ICD-10-CM | POA: Insufficient documentation

## 2021-09-23 DIAGNOSIS — E785 Hyperlipidemia, unspecified: Secondary | ICD-10-CM | POA: Insufficient documentation

## 2021-09-23 DIAGNOSIS — C711 Malignant neoplasm of frontal lobe: Secondary | ICD-10-CM | POA: Insufficient documentation

## 2021-09-23 DIAGNOSIS — Z7984 Long term (current) use of oral hypoglycemic drugs: Secondary | ICD-10-CM | POA: Diagnosis not present

## 2021-09-23 LAB — CBC WITH DIFFERENTIAL (CANCER CENTER ONLY)
Abs Immature Granulocytes: 0.05 10*3/uL (ref 0.00–0.07)
Basophils Absolute: 0 10*3/uL (ref 0.0–0.1)
Basophils Relative: 0 %
Eosinophils Absolute: 0.8 10*3/uL — ABNORMAL HIGH (ref 0.0–0.5)
Eosinophils Relative: 10 %
HCT: 47.5 % (ref 39.0–52.0)
Hemoglobin: 16.4 g/dL (ref 13.0–17.0)
Immature Granulocytes: 1 %
Lymphocytes Relative: 13 %
Lymphs Abs: 1.1 10*3/uL (ref 0.7–4.0)
MCH: 29.3 pg (ref 26.0–34.0)
MCHC: 34.5 g/dL (ref 30.0–36.0)
MCV: 84.8 fL (ref 80.0–100.0)
Monocytes Absolute: 0.5 10*3/uL (ref 0.1–1.0)
Monocytes Relative: 6 %
Neutro Abs: 5.8 10*3/uL (ref 1.7–7.7)
Neutrophils Relative %: 70 %
Platelet Count: 201 10*3/uL (ref 150–400)
RBC: 5.6 MIL/uL (ref 4.22–5.81)
RDW: 12 % (ref 11.5–15.5)
WBC Count: 8.2 10*3/uL (ref 4.0–10.5)
nRBC: 0 % (ref 0.0–0.2)

## 2021-09-23 LAB — CMP (CANCER CENTER ONLY)
ALT: 29 U/L (ref 0–44)
AST: 15 U/L (ref 15–41)
Albumin: 4.6 g/dL (ref 3.5–5.0)
Alkaline Phosphatase: 95 U/L (ref 38–126)
Anion gap: 6 (ref 5–15)
BUN: 12 mg/dL (ref 6–20)
CO2: 34 mmol/L — ABNORMAL HIGH (ref 22–32)
Calcium: 9.9 mg/dL (ref 8.9–10.3)
Chloride: 99 mmol/L (ref 98–111)
Creatinine: 0.84 mg/dL (ref 0.61–1.24)
GFR, Estimated: >60 mL/min (ref >60)
Glucose, Bld: 209 mg/dL — ABNORMAL HIGH (ref 70–99)
Potassium: 4.3 mmol/L (ref 3.5–5.1)
Sodium: 139 mmol/L (ref 135–145)
Total Bilirubin: 0.6 mg/dL (ref 0.3–1.2)
Total Protein: 7.8 g/dL (ref 6.5–8.1)

## 2021-09-23 MED ORDER — TEMOZOLOMIDE 140 MG PO CAPS
150.0000 mg/m2/d | ORAL_CAPSULE | Freq: Every day | ORAL | 0 refills | Status: DC
  Filled 2021-09-23: qty 10, 5d supply, fill #0
  Filled 2021-09-25: qty 10, 28d supply, fill #0

## 2021-09-23 NOTE — Progress Notes (Signed)
? ?[facility] at [address]  ?[place], [place] [address] ?[phone]) [phone] ? ?Interval Evaluation ? ?Date of Service: 09/23/21 ?Patient Name: Alan Wise ?Patient MRN: [id number] ?Patient DOB: 1970/09/17 ?Provider: Ventura Sellers, MD ? ?Identifying Statement:  ?DERRIN CURREY is a 51 y.o. male with R frontal oligodendroglioma ? ? ?Oncology History  ?Oligodendroglioma ([place])  ?01/17/2021 Surgery  ? Stereotactic biopsy with Dr. Marcello Moores; path demonstrates IDH-1 mutant oligodendroglioma ?  ?03/20/2021 - 03/20/2021 Chemotherapy  ? Patient is on Treatment Plan : BRAIN Low Grade Glioma Grade II, Anaplastic Glioma Grade III, Radiation Therapy with Concurrent Temozolomide 37m/m2 Daily Followed by Sequential Maintenance Temozolomide  ?   ?06/02/2021 -  Chemotherapy  ? Patient is on Treatment Plan : BRAIN ANAPLASTIC GLIOMA GRADE III Temozolomide Post XRT q28d  ?   ? ? ?Biomarkers: ? ?MGMT Unknown.  ?IDH 1/2 Mutated.  ?1p/19q Co-deleted  ?TERT Unknown  ? ?Interval History: ?JSEKAI GITLINpresents today for follow up with MRI brain, now having completed cycle #3 of adjuvant 5-day Temodar.  Describes improvement this cycle with nausea and lethargy, possibly because he dosed during spring break, low stress week. He otherwise denies any new or progressive neurologic deficits.  Still working full time. Occasionally has difficulty finding a word, as prior.  No issues with gait or left sided weakness, no further seizures. ? ?H+P (12/31/20) Patient presented to medical attention this past month, with complaint of involuntary left sided shaking.  He describes episodes, occurring several times per week, of left arm shaking, lasting up to 5 minutes; following the episodes he describes the arm as "heavy" for some time before returning to normal.  He also describes episodes of sudden inability to speak, with strange movements of face and hands, lasting same amount of time.  These episodes go back  ~10 years in very sporadic nature, but have become much more frequent in recent months.  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  These episodes have occurred at work, and have been disruptive in that environment.  He is otherwise fully functional, independent. ? ?Medications: ?Current Outpatient Medications on File Prior to Visit  ?Medication Sig Dispense Refill  ? atorvastatin (LIPITOR) 20 MG tablet Take 1 tablet (20 mg total) by mouth daily. 90 tablet 3  ? docusate sodium (COLACE) 100 MG capsule Take 1 capsule (100 mg total) by mouth 2 (two) times daily. 60 capsule 2  ? FREESTYLE LITE test strip USE TWO TIMES DAILY TO TEST BLOOD SUGAR 50 each 11  ? glipiZIDE (GLUCOTROL) 10 MG tablet TAKE 1 TABLET (10 MG TOTAL) BY MOUTH 2 (TWO) TIMES DAILY BEFORE A MEAL. (Patient taking differently: Take 10 mg by mouth 2 (two) times daily.) 180 tablet 0  ? JANUVIA 50 MG tablet TAKE 1 TABLET BY MOUTH EVERY DAY 90 tablet 0  ? levETIRAcetam (KEPPRA) 500 MG tablet Take 1 tablet (500 mg total) by mouth 2 (two) times daily. 60 tablet 3  ? ondansetron (ZOFRAN) 8 MG tablet Take 1 tablet (8 mg total) by mouth 2 (two) times daily as needed (nausea and vomiting). May take 30-60 minutes prior to Temodar administration if nausea/vomiting occurs. 30 tablet 1  ? prednisoLONE acetate (PRED FORTE) 1 % ophthalmic suspension Place 1 drop into both eyes 2 (two) times daily.    ? traZODone (DESYREL) 50 MG tablet Take 1 tablet (50 mg total) by mouth at bedtime. 60 tablet 1  ? ?No current facility-administered  medications on file prior to visit.  ? ? ?Allergies:  ?Allergies  ?Allergen Reactions  ? Contrast Media [Iodinated Contrast Media] Swelling  ? ?Past Medical History:  ?Past Medical History:  ?Diagnosis Date  ? Allergy   ? Complication of anesthesia   ? pt reports he is starting to have more difficulty arousing after surgery  ? Diabetes mellitus ([place])   ? GERD (gastroesophageal reflux disease)   ? Headache   ? History of kidney  stones   ? Hyperlipidemia   ? Renal disorder   ? kidney stones  ? ?Past Surgical History:  ?Past Surgical History:  ?Procedure Laterality Date  ? APPLICATION OF CRANIAL NAVIGATION Right 01/17/2021  ? Procedure: APPLICATION OF CRANIAL NAVIGATION;  Surgeon: Vallarie Mare, MD;  Location: [facility];  Service: Neurosurgery;  Laterality: Right;  ? COLONOSCOPY  09/03/1994  ? CYSTOSCOPY WITH STENT PLACEMENT Right 01/25/2016  ? Procedure: CYSTOSCOPY WITH STENT PLACEMENT;  Surgeon: Nickie Retort, MD;  Location: [facility];  Service: Urology;  Laterality: Right;  ? FRAMELESS  BIOPSY WITH BRAINLAB Right 01/17/2021  ? Procedure: RIGHT STEREOTACTIC BIOPSY OF INSULAR LESION;  Surgeon: Vallarie Mare, MD;  Location: [facility];  Service: Neurosurgery;  Laterality: Right;  ? KNEE ARTHROSCOPY W/ MENISCAL REPAIR Right 06/23/1988  ? removal of birthmark  06/08/2013  ? SKIN CANCER EXCISION  06/23/2012  ? TYMPANOSTOMY TUBE PLACEMENT  08/06/1978  ? URETEROSCOPY WITH HOLMIUM LASER LITHOTRIPSY Right 01/25/2016  ? Procedure: URETEROSCOPY WITH HOLMIUM LASER LITHOTRIPSY;  Surgeon: Nickie Retort, MD;  Location: [facility];  Service: Urology;  Laterality: Right;  ? URETHRAL STRICTURE DILATATION    ? visual inspection of vocal cord    ? 1973  ? WISDOM TOOTH EXTRACTION    ? ?Social History:  ?Social History  ? ?Socioeconomic History  ? Marital status: Married  ?  Spouse name: Not on file  ? Number of children: Not on file  ? Years of education: Not on file  ? Highest education level: Not on file  ?Occupational History  ? Not on file  ?Tobacco Use  ? Smoking status: Never  ? Smokeless tobacco: Never  ?Vaping Use  ? Vaping Use: Never used  ?Substance and Sexual Activity  ? Alcohol use: Not Currently  ?  Comment: rare  ? Drug use: No  ? Sexual activity: Not on file  ?Other Topics Concern  ? Not on file  ?Social History Narrative  ? Not on file  ? ?Social Determinants of Health  ? ?Financial Resource Strain: Not on file  ?Food Insecurity: Not on  file  ?Transportation Needs: Not on file  ?Physical Activity: Not on file  ?Stress: Not on file  ?Social Connections: Not on file  ?Intimate Partner Violence: Not on file  ? ?Family History:  ?Family History  ?Problem Relation Age of Onset  ? Hyperlipidemia Father   ? Hypertension Father   ? Diabetes Father   ? Benign prostatic hyperplasia Father   ? Stroke Maternal Grandmother   ? Diabetes Maternal Grandmother   ? Stroke Paternal Grandmother   ? Hypertension Paternal Grandmother   ? Kidney disease Neg Hx   ? Prostate cancer Neg Hx   ? ? ?Review of Systems: ?Constitutional: Doesn't report fevers, chills or abnormal weight loss ?Eyes: Doesn't report blurriness of vision ?Ears, nose, mouth, throat, and face: Doesn't report sore throat ?Respiratory: Doesn't report cough, dyspnea or wheezes ?Cardiovascular: Doesn't report palpitation, chest discomfort  ?Gastrointestinal:  Doesn't report nausea, constipation, diarrhea ?  GU: Doesn't report incontinence ?Skin: Doesn't report skin rashes ?Neurological: Per HPI ?Musculoskeletal: Doesn't report joint pain ?Behavioral/Psych: Doesn't report anxiety ? ?Physical Exam: ?Vitals:  ? 09/23/21 1218  ?BP: 137/80  ?Pulse: 73  ?Resp: 17  ?Temp: (!) 96.8 ?F (36 ?C)  ?SpO2: 99%  ? ?KPS: 80. ?General: Fatigued ?Head: Normal ?EENT: No conjunctival injection or scleral icterus.  ?Lungs: Resp effort normal ?Cardiac: Regular rate ?Abdomen: Non-distended abdomen ?Skin: No rashes cyanosis or petechiae. ?Extremities: No clubbing or edema ? ?Neurologic Exam: ?Mental Status: Awake, alert, attentive to examiner. Oriented to self and environment. Language is fluent with intact comprehension.  Modest psychomotor slowing, impaired 3 object recall ?Cranial Nerves: Visual acuity is grossly normal. Visual fields are full. Extra-ocular movements intact. No ptosis. Face is symmetric ?Motor: Tone and bulk are normal. Power is full in both arms and legs. Reflexes are symmetric, no pathologic reflexes present.   ?Sensory: Intact to light touch ?Gait: Normal. ? ? ?Labs: ?I have reviewed the data as listed ?   ?Component Value Date/Time  ? NA 137 08/05/2021 1120  ? NA 138 05/04/2012 1815  ? K 4.0 08/05/2021 1120

## 2021-09-24 ENCOUNTER — Telehealth: Payer: Self-pay | Admitting: Internal Medicine

## 2021-09-24 NOTE — Telephone Encounter (Signed)
Scheduled per 4/3 los, pt has been called and confirmed ?

## 2021-09-25 ENCOUNTER — Other Ambulatory Visit (HOSPITAL_COMMUNITY): Payer: Self-pay

## 2021-09-26 ENCOUNTER — Other Ambulatory Visit (HOSPITAL_COMMUNITY): Payer: Self-pay

## 2021-09-27 ENCOUNTER — Other Ambulatory Visit (HOSPITAL_COMMUNITY): Payer: Self-pay

## 2021-10-02 ENCOUNTER — Other Ambulatory Visit: Payer: Self-pay | Admitting: Internal Medicine

## 2021-10-02 ENCOUNTER — Other Ambulatory Visit (HOSPITAL_COMMUNITY): Payer: Self-pay

## 2021-10-02 NOTE — Telephone Encounter (Signed)
Glipizide too early. ?Requested Prescriptions  ?Pending Prescriptions Disp Refills  ?? JANUVIA 50 MG tablet [Pharmacy Med Name: JANUVIA 50 MG TABLET] 90 tablet 0  ?  Sig: TAKE 1 TABLET BY MOUTH EVERY DAY  ?  ? Endocrinology:  Diabetes - DPP-4 Inhibitors Failed - 10/02/2021 12:45 PM  ?  ?  Failed - HBA1C is between 0 and 7.9 and within 180 days  ?  Hgb A1c MFr Bld  ?Date Value Ref Range Status  ?12/05/2020 9.1 (H) <5.7 % of total Hgb Final  ?  Comment:  ?  For someone without known diabetes, a hemoglobin A1c ?value of 6.5% or greater indicates that they may have  ?diabetes and this should be confirmed with a follow-up  ?test. ?. ?For someone with known diabetes, a value <7% indicates  ?that their diabetes is well controlled and a value  ?greater than or equal to 7% indicates suboptimal  ?control. A1c targets should be individualized based on  ?duration of diabetes, age, comorbid conditions, and  ?other considerations. ?. ?Currently, no consensus exists regarding use of ?hemoglobin A1c for diagnosis of diabetes for children. ?. ?  ?   ?  ?  Failed - Valid encounter within last 6 months  ?  Recent Outpatient Visits   ?      ? 8 months ago [facility])  ? [facility] [place], [place] W, NP  ? 10 months ago Seizure-like activity [facility])  ? [facility] [place], Coralie Keens, NP  ?  ?  ?Future Appointments   ?        ? In 2 weeks Baity, Coralie Keens, NP [place]  ?  ? ?  ?  ?  Passed - Cr in normal range and within 360 days  ?  Creatinine  ?Date Value Ref Range Status  ?09/23/2021 0.84 0.61 - 1.24 mg/dL Final  ? ?Creat  ?Date Value Ref Range Status  ?12/05/2020 0.79 0.60 - 1.35 mg/dL Final  ? ?Creatinine,U  ?Date Value Ref Range Status  ?04/24/2020 113.1 mg/dL Final  ?   ?  ?  ?? glipiZIDE (GLUCOTROL) 10 MG tablet 180 tablet 0  ?  Sig: Take 1 tablet (10 mg total) by mouth 2 (two) times daily before a meal.  ?  ? Endocrinology:  Diabetes - Sulfonylureas Failed - 10/02/2021 12:45 PM  ?   ?  Failed - HBA1C is between 0 and 7.9 and within 180 days  ?  Hgb A1c MFr Bld  ?Date Value Ref Range Status  ?12/05/2020 9.1 (H) <5.7 % of total Hgb Final  ?  Comment:  ?  For someone without known diabetes, a hemoglobin A1c ?value of 6.5% or greater indicates that they may have  ?diabetes and this should be confirmed with a follow-up  ?test. ?. ?For someone with known diabetes, a value <7% indicates  ?that their diabetes is well controlled and a value  ?greater than or equal to 7% indicates suboptimal  ?control. A1c targets should be individualized based on  ?duration of diabetes, age, comorbid conditions, and  ?other considerations. ?. ?Currently, no consensus exists regarding use of ?hemoglobin A1c for diagnosis of diabetes for children. ?. ?  ?   ?  ?  Failed - Valid encounter within last 6 months  ?  Recent Outpatient Visits   ?      ? 8 months ago [facility])  ? [facility] [place], Coralie Keens, NP  ?  10 months ago Seizure-like activity ([place])  ? [facility] [place], Coralie Keens, NP  ?  ?  ?Future Appointments   ?        ? In 2 weeks Baity, Coralie Keens, NP [place]  ?  ? ?  ?  ?  Passed - Cr in normal range and within 360 days  ?  Creatinine  ?Date Value Ref Range Status  ?09/23/2021 0.84 0.61 - 1.24 mg/dL Final  ? ?Creat  ?Date Value Ref Range Status  ?12/05/2020 0.79 0.60 - 1.35 mg/dL Final  ? ?Creatinine,U  ?Date Value Ref Range Status  ?04/24/2020 113.1 mg/dL Final  ?   ?  ?  ? ? ?

## 2021-10-02 NOTE — Telephone Encounter (Signed)
Visit scheduled, pt requesting to also fill Glipizide. ?

## 2021-10-15 ENCOUNTER — Other Ambulatory Visit: Payer: Self-pay | Admitting: *Deleted

## 2021-10-15 DIAGNOSIS — C719 Malignant neoplasm of brain, unspecified: Secondary | ICD-10-CM

## 2021-10-15 MED ORDER — ONDANSETRON HCL 8 MG PO TABS: 8.0000 mg | ORAL_TABLET | Freq: Two times a day (BID) | ORAL | 1 refills | Status: DC | PRN

## 2021-10-21 ENCOUNTER — Encounter: Payer: Self-pay | Admitting: Internal Medicine

## 2021-10-21 ENCOUNTER — Ambulatory Visit: Payer: BC Managed Care – PPO | Admitting: Internal Medicine

## 2021-10-21 ENCOUNTER — Other Ambulatory Visit: Payer: Self-pay

## 2021-10-21 ENCOUNTER — Inpatient Hospital Stay: Payer: BC Managed Care – PPO | Attending: Internal Medicine

## 2021-10-21 ENCOUNTER — Other Ambulatory Visit (HOSPITAL_COMMUNITY): Payer: Self-pay

## 2021-10-21 ENCOUNTER — Inpatient Hospital Stay: Payer: BC Managed Care – PPO | Admitting: Internal Medicine

## 2021-10-21 VITALS — BP 124/76 | HR 100 | Temp 97.1°F | Ht 68.5 in | Wt 157.0 lb

## 2021-10-21 VITALS — BP 130/88 | HR 75 | Temp 97.7°F | Resp 20 | Wt 155.5 lb

## 2021-10-21 DIAGNOSIS — Z79899 Other long term (current) drug therapy: Secondary | ICD-10-CM | POA: Diagnosis not present

## 2021-10-21 DIAGNOSIS — R251 Tremor, unspecified: Secondary | ICD-10-CM | POA: Insufficient documentation

## 2021-10-21 DIAGNOSIS — Z87442 Personal history of urinary calculi: Secondary | ICD-10-CM | POA: Insufficient documentation

## 2021-10-21 DIAGNOSIS — Z7984 Long term (current) use of oral hypoglycemic drugs: Secondary | ICD-10-CM | POA: Insufficient documentation

## 2021-10-21 DIAGNOSIS — R569 Unspecified convulsions: Secondary | ICD-10-CM

## 2021-10-21 DIAGNOSIS — E1165 Type 2 diabetes mellitus with hyperglycemia: Secondary | ICD-10-CM

## 2021-10-21 DIAGNOSIS — Z79630 Long term (current) use of alkylating agent: Secondary | ICD-10-CM | POA: Diagnosis not present

## 2021-10-21 DIAGNOSIS — E785 Hyperlipidemia, unspecified: Secondary | ICD-10-CM | POA: Diagnosis not present

## 2021-10-21 DIAGNOSIS — C719 Malignant neoplasm of brain, unspecified: Secondary | ICD-10-CM

## 2021-10-21 DIAGNOSIS — K219 Gastro-esophageal reflux disease without esophagitis: Secondary | ICD-10-CM | POA: Insufficient documentation

## 2021-10-21 DIAGNOSIS — R5383 Other fatigue: Secondary | ICD-10-CM | POA: Insufficient documentation

## 2021-10-21 DIAGNOSIS — C711 Malignant neoplasm of frontal lobe: Secondary | ICD-10-CM | POA: Insufficient documentation

## 2021-10-21 DIAGNOSIS — Z114 Encounter for screening for human immunodeficiency virus [HIV]: Secondary | ICD-10-CM | POA: Diagnosis not present

## 2021-10-21 DIAGNOSIS — E119 Type 2 diabetes mellitus without complications: Secondary | ICD-10-CM | POA: Insufficient documentation

## 2021-10-21 DIAGNOSIS — Z0001 Encounter for general adult medical examination with abnormal findings: Secondary | ICD-10-CM | POA: Diagnosis not present

## 2021-10-21 DIAGNOSIS — R112 Nausea with vomiting, unspecified: Secondary | ICD-10-CM | POA: Diagnosis not present

## 2021-10-21 LAB — CBC WITH DIFFERENTIAL (CANCER CENTER ONLY)
Abs Immature Granulocytes: 0.03 10*3/uL (ref 0.00–0.07)
Basophils Absolute: 0.1 10*3/uL (ref 0.0–0.1)
Basophils Relative: 1 %
Eosinophils Absolute: 0.5 10*3/uL (ref 0.0–0.5)
Eosinophils Relative: 7 %
HCT: 45 % (ref 39.0–52.0)
Hemoglobin: 15.8 g/dL (ref 13.0–17.0)
Immature Granulocytes: 0 %
Lymphocytes Relative: 14 %
Lymphs Abs: 1 10*3/uL (ref 0.7–4.0)
MCH: 29.2 pg (ref 26.0–34.0)
MCHC: 35.1 g/dL (ref 30.0–36.0)
MCV: 83 fL (ref 80.0–100.0)
Monocytes Absolute: 0.5 10*3/uL (ref 0.1–1.0)
Monocytes Relative: 7 %
Neutro Abs: 5 10*3/uL (ref 1.7–7.7)
Neutrophils Relative %: 71 %
Platelet Count: 195 10*3/uL (ref 150–400)
RBC: 5.42 MIL/uL (ref 4.22–5.81)
RDW: 12.1 % (ref 11.5–15.5)
WBC Count: 7.1 10*3/uL (ref 4.0–10.5)
nRBC: 0 % (ref 0.0–0.2)

## 2021-10-21 LAB — CMP (CANCER CENTER ONLY)
ALT: 64 U/L — ABNORMAL HIGH (ref 0–44)
AST: 27 U/L (ref 15–41)
Albumin: 4.6 g/dL (ref 3.5–5.0)
Alkaline Phosphatase: 91 U/L (ref 38–126)
Anion gap: 5 (ref 5–15)
BUN: 11 mg/dL (ref 6–20)
CO2: 32 mmol/L (ref 22–32)
Calcium: 9.5 mg/dL (ref 8.9–10.3)
Chloride: 99 mmol/L (ref 98–111)
Creatinine: 0.85 mg/dL (ref 0.61–1.24)
GFR, Estimated: >60 mL/min (ref >60)
Glucose, Bld: 202 mg/dL — ABNORMAL HIGH (ref 70–99)
Potassium: 4.1 mmol/L (ref 3.5–5.1)
Sodium: 136 mmol/L (ref 135–145)
Total Bilirubin: 0.6 mg/dL (ref 0.3–1.2)
Total Protein: 7.4 g/dL (ref 6.5–8.1)

## 2021-10-21 MED ORDER — GLIPIZIDE 10 MG PO TABS: 10.0000 mg | ORAL_TABLET | Freq: Two times a day (BID) | ORAL | 1 refills | Status: DC

## 2021-10-21 MED ORDER — SITAGLIPTIN PHOSPHATE 50 MG PO TABS: 50.0000 mg | ORAL_TABLET | Freq: Every day | ORAL | 1 refills | Status: DC

## 2021-10-21 MED ORDER — ATORVASTATIN CALCIUM 20 MG PO TABS: 20.0000 mg | ORAL_TABLET | Freq: Every day | ORAL | 1 refills | Status: DC

## 2021-10-21 MED ORDER — TEMOZOLOMIDE 140 MG PO CAPS
150.0000 mg/m2/d | ORAL_CAPSULE | Freq: Every day | ORAL | 0 refills | Status: AC
  Filled 2021-10-21 – 2021-11-19 (×2): qty 10, 5d supply, fill #0

## 2021-10-21 NOTE — Progress Notes (Signed)
? ?Subjective:  ? ? Patient ID: Alan Wise, male    DOB: 09-24-1970, 51 y.o.   MRN: [id number] ? ?HPI ? ?Pt presents to the clinic today for his annual exam. ? ?Flu: never ?Tetanus: unsure ?Covid: Janssen x 1 ?Colon screening: never ?Vision screening: annually ?Dentist: biannually ? ?Diet: He is eat meat. He consumes fruits and veggies. He does eat some fried foods. He drinks mostly soda, water. ?Exercise: None ? ? ?Review of Systems ? ?   ?Past Medical History:  ?Diagnosis Date  ? Allergy   ? Complication of anesthesia   ? pt reports he is starting to have more difficulty arousing after surgery  ? Diabetes mellitus ([place])   ? GERD (gastroesophageal reflux disease)   ? Headache   ? History of kidney stones   ? Hyperlipidemia   ? Renal disorder   ? kidney stones  ? ? ?Current Outpatient Medications  ?Medication Sig Dispense Refill  ? atorvastatin (LIPITOR) 20 MG tablet Take 1 tablet (20 mg total) by mouth daily. 90 tablet 3  ? docusate sodium (COLACE) 100 MG capsule Take 1 capsule (100 mg total) by mouth 2 (two) times daily. 60 capsule 2  ? FREESTYLE LITE test strip USE TWO TIMES DAILY TO TEST BLOOD SUGAR 50 each 11  ? glipiZIDE (GLUCOTROL) 10 MG tablet TAKE 1 TABLET (10 MG TOTAL) BY MOUTH 2 (TWO) TIMES DAILY BEFORE A MEAL. (Patient taking differently: Take 10 mg by mouth 2 (two) times daily.) 180 tablet 0  ? JANUVIA 50 MG tablet TAKE 1 TABLET BY MOUTH EVERY DAY 30 tablet 0  ? levETIRAcetam (KEPPRA) 500 MG tablet Take 1 tablet (500 mg total) by mouth 2 (two) times daily. 60 tablet 3  ? ondansetron (ZOFRAN) 8 MG tablet Take 1 tablet (8 mg total) by mouth 2 (two) times daily as needed (nausea and vomiting). May take 30-60 minutes prior to Temodar administration if nausea/vomiting occurs. 30 tablet 1  ? prednisoLONE acetate (PRED FORTE) 1 % ophthalmic suspension Place 1 drop into both eyes 2 (two) times daily.    ? temozolomide (TEMODAR) 140 MG capsule Take 2 capsules (280 mg total) by mouth daily for 5 days. Take  on Days 1-5 of Cycle 1 of treatment. May take on an empty stomach to decrease nausea & vomiting. 10 capsule 0  ? traZODone (DESYREL) 50 MG tablet Take 1 tablet (50 mg total) by mouth at bedtime. 60 tablet 1  ? ?No current facility-administered medications for this visit.  ? ? ?Allergies  ?Allergen Reactions  ? Contrast Media [Iodinated Contrast Media] Swelling  ? ? ?Family History  ?Problem Relation Age of Onset  ? Hyperlipidemia Father   ? Hypertension Father   ? Diabetes Father   ? Benign prostatic hyperplasia Father   ? Stroke Maternal Grandmother   ? Diabetes Maternal Grandmother   ? Stroke Paternal Grandmother   ? Hypertension Paternal Grandmother   ? Kidney disease Neg Hx   ? Prostate cancer Neg Hx   ? ? ?Social History  ? ?Socioeconomic History  ? Marital status: Married  ?  Spouse name: Not on file  ? Number of children: Not on file  ? Years of education: Not on file  ? Highest education level: Not on file  ?Occupational History  ? Not on file  ?Tobacco Use  ? Smoking status: Never  ? Smokeless tobacco: Never  ?Vaping Use  ? Vaping Use: Never used  ?Substance and Sexual Activity  ? Alcohol use:  Not Currently  ?  Comment: rare  ? Drug use: No  ? Sexual activity: Not on file  ?Other Topics Concern  ? Not on file  ?Social History Narrative  ? Not on file  ? ?Social Determinants of Health  ? ?Financial Resource Strain: Not on file  ?Food Insecurity: Not on file  ?Transportation Needs: Not on file  ?Physical Activity: Not on file  ?Stress: Not on file  ?Social Connections: Not on file  ?Intimate Partner Violence: Not on file  ? ? ? ?Constitutional: Denies fever, malaise, fatigue, headache or abrupt weight changes.  ?HEENT: Denies eye pain, eye redness, ear pain, ringing in the ears, wax buildup, runny nose, nasal congestion, bloody nose, or sore throat. ?Respiratory: Denies difficulty breathing, shortness of breath, cough or sputum production.   ?Cardiovascular: Denies chest pain, chest tightness, palpitations or  swelling in the hands or feet.  ?Gastrointestinal: Pt reports alternating constipation and diarrhea. Denies abdominal pain, bloating, or blood in the stool.  ?GU: Denies urgency, frequency, pain with urination, burning sensation, blood in urine, odor or discharge. ?Musculoskeletal: Denies decrease in range of motion, difficulty with gait, muscle pain or joint pain and swelling.  ?Skin: Denies redness, rashes, lesions or ulcercations.  ?Neurological: Denies dizziness, difficulty with memory, difficulty with speech or problems with balance and coordination.  ?Psych: Pt has a history of anxiety and depression. Denies SI/HI. ? ?No other specific complaints in a complete review of systems (except as listed in HPI above). ? ?Objective:  ? Physical Exam ? ?BP 124/76 (BP Location: Right Arm, Patient Position: Sitting, Cuff Size: Normal)   Pulse 100   Temp (!) 97.1 ?F (36.2 ?C) (Temporal)   Ht 5' 8.5" (1.74 m)   Wt 157 lb (71.2 kg)   SpO2 96%   BMI 23.52 kg/m?  ? ?Wt Readings from Last 3 Encounters:  ?09/23/21 154 lb 7 oz (70.1 kg)  ?08/05/21 150 lb 3.2 oz (68.1 kg)  ?07/08/21 152 lb 9 oz (69.2 kg)  ? ? ?General: Appears his stated age, well developed, well nourished in NAD. ?Skin: Warm, dry and intact. ?HEENT: Head: normal shape and size; Eyes: sclera white, no icterus, conjunctiva pink, PERRLA and EOMs intact;  ?Neck:  Neck supple, trachea midline. No masses, lumps or thyromegaly present.  ?Cardiovascular: Normal rate and rhythm. S1,S2 noted.  No murmur, rubs or gallops noted. No JVD or BLE edema.  ?Pulmonary/Chest: Normal effort and positive vesicular breath sounds. No respiratory distress. No wheezes, rales or ronchi noted.  ?Abdomen: Soft and nontender. Normal bowel sounds.  ?Musculoskeletal: Strength 5/5 BUE/BLE. No difficulty with gait.  ?Neurological: Alert and oriented. Cranial nerves II-XII grossly intact. Coordination normal.  ?Psychiatric: Mood and affect normal. Behavior is normal. Judgment and thought  content normal.  ? ? ?BMET ?   ?Component Value Date/Time  ? NA 139 09/23/2021 1150  ? NA 138 05/04/2012 1815  ? K 4.3 09/23/2021 1150  ? K 3.5 05/04/2012 1815  ? CL 99 09/23/2021 1150  ? CL 99 05/04/2012 1815  ? CO2 34 (H) 09/23/2021 1150  ? CO2 29 05/04/2012 1815  ? GLUCOSE 209 (H) 09/23/2021 1150  ? GLUCOSE 69 05/04/2012 1815  ? BUN 12 09/23/2021 1150  ? BUN 12 05/04/2012 1815  ? CREATININE 0.84 09/23/2021 1150  ? CREATININE 0.79 12/05/2020 1014  ? CALCIUM 9.9 09/23/2021 1150  ? CALCIUM 9.2 05/04/2012 1815  ? GFRNONAA >60 09/23/2021 1150  ? GFRNONAA 106 12/05/2020 1014  ? GFRAA 123 12/05/2020 1014  ? ? ?  Lipid Panel  ?   ?Component Value Date/Time  ? CHOL 165 12/05/2020 1014  ? TRIG 214 (H) 12/05/2020 1014  ? HDL 32 (L) 12/05/2020 1014  ? CHOLHDL 5.2 (H) 12/05/2020 1014  ? VLDL 60.6 (H) 08/30/2020 1506  ? LDLCALC 100 (H) 12/05/2020 1014  ? ? ?CBC ?   ?Component Value Date/Time  ? WBC 8.2 09/23/2021 1150  ? WBC 8.1 01/15/2021 1200  ? RBC 5.60 09/23/2021 1150  ? HGB 16.4 09/23/2021 1150  ? HGB 16.4 05/04/2012 1815  ? HCT 47.5 09/23/2021 1150  ? HCT 48.4 05/04/2012 1815  ? PLT 201 09/23/2021 1150  ? PLT 261 05/04/2012 1815  ? MCV 84.8 09/23/2021 1150  ? MCV 86 05/04/2012 1815  ? MCH 29.3 09/23/2021 1150  ? MCHC 34.5 09/23/2021 1150  ? RDW 12.0 09/23/2021 1150  ? RDW 12.7 05/04/2012 1815  ? LYMPHSABS 1.1 09/23/2021 1150  ? MONOABS 0.5 09/23/2021 1150  ? EOSABS 0.8 (H) 09/23/2021 1150  ? BASOSABS 0.0 09/23/2021 1150  ? ? ?Hgb A1C ?Lab Results  ?Component Value Date  ? HGBA1C 9.1 (H) 12/05/2020  ? ? ? ? ? ? ?   ?Assessment & Plan:  ? ?Preventative Health Maintenance: ? ?Encouraged him to get a flu shot in the fall ?He declines tetanus  ?Encouraged him to get a covid booster ?He declines referral to GI for colonoscopy or Cologuard at this time ?Encouraged him to consume a balanced diet and exercise regimen ?Advised him to see an eye doctor and dentist annually ?Will check CBC, CMET, Lipid, A1C, urine microalbumin and HIV  today ? ?RTC in 3 months, follow up chronic conditions ? ? ?Webb Silversmith, NP ? ? ?

## 2021-10-21 NOTE — Patient Instructions (Signed)
Health Maintenance, Male Adopting a healthy lifestyle and getting preventive care are important in promoting health and wellness. Ask your health care provider about: The right schedule for you to have regular tests and exams. Things you can do on your own to prevent diseases and keep yourself healthy. What should I know about diet, weight, and exercise? Eat a healthy diet  Eat a diet that includes plenty of vegetables, fruits, low-fat dairy products, and lean protein. Do not eat a lot of foods that are high in solid fats, added sugars, or sodium. Maintain a healthy weight Body mass index (BMI) is a measurement that can be used to identify possible weight problems. It estimates body fat based on height and weight. Your health care provider can help determine your BMI and help you achieve or maintain a healthy weight. Get regular exercise Get regular exercise. This is one of the most important things you can do for your health. Most adults should: Exercise for at least 150 minutes each week. The exercise should increase your heart rate and make you sweat (moderate-intensity exercise). Do strengthening exercises at least twice a week. This is in addition to the moderate-intensity exercise. Spend less time sitting. Even light physical activity can be beneficial. Watch cholesterol and blood lipids Have your blood tested for lipids and cholesterol at 50 years of age, then have this test every 5 years. You may need to have your cholesterol levels checked more often if: Your lipid or cholesterol levels are high. You are older than 50 years of age. You are at high risk for heart disease. What should I know about cancer screening? Many types of cancers can be detected early and may often be prevented. Depending on your health history and family history, you may need to have cancer screening at various ages. This may include screening for: Colorectal cancer. Prostate cancer. Skin cancer. Lung  cancer. What should I know about heart disease, diabetes, and high blood pressure? Blood pressure and heart disease High blood pressure causes heart disease and increases the risk of stroke. This is more likely to develop in people who have high blood pressure readings or are overweight. Talk with your health care provider about your target blood pressure readings. Have your blood pressure checked: Every 3-5 years if you are 18-39 years of age. Every year if you are 40 years old or older. If you are between the ages of 65 and 75 and are a current or former smoker, ask your health care provider if you should have a one-time screening for abdominal aortic aneurysm (AAA). Diabetes Have regular diabetes screenings. This checks your fasting blood sugar level. Have the screening done: Once every three years after age 45 if you are at a normal weight and have a low risk for diabetes. More often and at a younger age if you are overweight or have a high risk for diabetes. What should I know about preventing infection? Hepatitis B If you have a higher risk for hepatitis B, you should be screened for this virus. Talk with your health care provider to find out if you are at risk for hepatitis B infection. Hepatitis C Blood testing is recommended for: Everyone born from 1945 through 1965. Anyone with known risk factors for hepatitis C. Sexually transmitted infections (STIs) You should be screened each year for STIs, including gonorrhea and chlamydia, if: You are sexually active and are younger than 50 years of age. You are older than 50 years of age and your   health care provider tells you that you are at risk for this type of infection. Your sexual activity has changed since you were last screened, and you are at increased risk for chlamydia or gonorrhea. Ask your health care provider if you are at risk. Ask your health care provider about whether you are at high risk for HIV. Your health care provider  may recommend a prescription medicine to help prevent HIV infection. If you choose to take medicine to prevent HIV, you should first get tested for HIV. You should then be tested every 3 months for as long as you are taking the medicine. Follow these instructions at home: Alcohol use Do not drink alcohol if your health care provider tells you not to drink. If you drink alcohol: Limit how much you have to 0-2 drinks a day. Know how much alcohol is in your drink. In the [place] one drink equals one 12 oz bottle of beer (355 mL), one 5 oz glass of wine (148 mL), or one 1 oz glass of hard liquor (44 mL). Lifestyle Do not use any products that contain nicotine or tobacco. These products include cigarettes, chewing tobacco, and vaping devices, such as e-cigarettes. If you need help quitting, ask your health care provider. Do not use street drugs. Do not share needles. Ask your health care provider for help if you need support or information about quitting drugs. General instructions Schedule regular health, dental, and eye exams. Stay current with your vaccines. Tell your health care provider if: You often feel depressed. You have ever been abused or do not feel safe at home. Summary Adopting a healthy lifestyle and getting preventive care are important in promoting health and wellness. Follow your health care provider's instructions about healthy diet, exercising, and getting tested or screened for diseases. Follow your health care provider's instructions on monitoring your cholesterol and blood pressure. This information is not intended to replace advice given to you by your health care provider. Make sure you discuss any questions you have with your health care provider. Document Revised: 10/29/2020 Document Reviewed: 10/29/2020 [employer]vier Patient Education  2023 [employer]  

## 2021-10-21 NOTE — Progress Notes (Signed)
? ?[facility] [address]  ?[place], [place] [address] ?[phone] ? ?Interval Evaluation ? ?Date of Service: 10/21/21 ?Patient Name: Alan Wise ?Patient MRN: [id number] ?Patient DOB: 1971-02-23 ?Provider: Ventura Sellers, MD ? ?Identifying Statement:  ?Alan Wise is a 51 y.o. male with R frontal oligodendroglioma ? ? ?Oncology History  ?Oligodendroglioma ([place])  ?01/17/2021 Surgery  ? Stereotactic biopsy with Dr. Marcello Moores; path demonstrates IDH-1 mutant oligodendroglioma ?  ?03/20/2021 - 03/20/2021 Chemotherapy  ? Patient is on Treatment Plan : BRAIN Low Grade Glioma Grade II, Anaplastic Glioma Grade III, Radiation Therapy with Concurrent Temozolomide 72m/m2 Daily Followed by Sequential Maintenance Temozolomide  ? ?  ?  ?06/02/2021 -  Chemotherapy  ? Patient is on Treatment Plan : BRAIN ANAPLASTIC GLIOMA GRADE III Temozolomide Post XRT q28d  ? ?  ?  ? ? ?Biomarkers: ? ?MGMT Unknown.  ?IDH 1/2 Mutated.  ?1p/19q Co-deleted  ?TERT Unknown  ? ?Interval History: ?Alan OELKERSpresents today for follow up with MRI brain, now having completed cycle #4 of adjuvant 5-day Temodar.  Continues to experience significant lethargy with treatment. He otherwise denies any new or progressive neurologic deficits.  Still working full time.  No issues with gait or left sided weakness, no further seizures. ? ?H+P (12/31/20) Patient presented to medical attention this past month, with complaint of involuntary left sided shaking.  He describes episodes, occurring several times per week, of left arm shaking, lasting up to 5 minutes; following the episodes he describes the arm as "heavy" for some time before returning to normal.  He also describes episodes of sudden inability to speak, with strange movements of face and hands, lasting same amount of time.  These episodes go back ~10 years in very sporadic nature, but have become much more frequent in recent months.  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  These episodes have occurred at work, and have been disruptive in that environment.  He is otherwise fully functional, independent. ? ?Medications: ?Current Outpatient Medications on File Prior to Visit  ?Medication Sig Dispense Refill  ? atorvastatin (LIPITOR) 20 MG tablet Take 1 tablet (20 mg total) by mouth daily. 90 tablet 3  ? docusate sodium (COLACE) 100 MG capsule Take 1 capsule (100 mg total) by mouth 2 (two) times daily. 60 capsule 2  ? FREESTYLE LITE test strip USE TWO TIMES DAILY TO TEST BLOOD SUGAR 50 each 11  ? glipiZIDE (GLUCOTROL) 10 MG tablet TAKE 1 TABLET (10 MG TOTAL) BY MOUTH 2 (TWO) TIMES DAILY BEFORE A MEAL. (Patient taking differently: Take 10 mg by mouth 2 (two) times daily.) 180 tablet 0  ? JANUVIA 50 MG tablet TAKE 1 TABLET BY MOUTH EVERY DAY 30 tablet 0  ? levETIRAcetam (KEPPRA) 500 MG tablet Take 1 tablet (500 mg total) by mouth 2 (two) times daily. 60 tablet 3  ? ondansetron (ZOFRAN) 8 MG tablet Take 1 tablet (8 mg total) by mouth 2 (two) times daily as needed (nausea and vomiting). May take 30-60 minutes prior to Temodar administration if nausea/vomiting occurs. 30 tablet 1  ? prednisoLONE acetate (PRED FORTE) 1 % ophthalmic suspension Place 1 drop into both eyes 2 (two) times daily.    ? temozolomide (TEMODAR) 140 MG capsule Take 2 capsules (280 mg total) by mouth daily for 5 days. Take on Days 1-5 of Cycle 1 of treatment. May take on an empty stomach to decrease nausea & vomiting. 10  capsule 0  ? traZODone (DESYREL) 50 MG tablet Take 1 tablet (50 mg total) by mouth at bedtime. 60 tablet 1  ? ?No current facility-administered medications on file prior to visit.  ? ? ?Allergies:  ?Allergies  ?Allergen Reactions  ? Contrast Media [Iodinated Contrast Media] Swelling  ? ?Past Medical History:  ?Past Medical History:  ?Diagnosis Date  ? Allergy   ? Complication of anesthesia   ? pt reports he is starting to have more difficulty arousing after surgery   ? Diabetes mellitus ([place])   ? GERD (gastroesophageal reflux disease)   ? Headache   ? History of kidney stones   ? Hyperlipidemia   ? Renal disorder   ? kidney stones  ? ?Past Surgical History:  ?Past Surgical History:  ?Procedure Laterality Date  ? APPLICATION OF CRANIAL NAVIGATION Right 01/17/2021  ? Procedure: APPLICATION OF CRANIAL NAVIGATION;  Surgeon: Vallarie Mare, MD;  Location: [facility];  Service: Neurosurgery;  Laterality: Right;  ? COLONOSCOPY  09/03/1994  ? CYSTOSCOPY WITH STENT PLACEMENT Right 01/25/2016  ? Procedure: CYSTOSCOPY WITH STENT PLACEMENT;  Surgeon: Nickie Retort, MD;  Location: [facility];  Service: Urology;  Laterality: Right;  ? FRAMELESS  BIOPSY WITH BRAINLAB Right 01/17/2021  ? Procedure: RIGHT STEREOTACTIC BIOPSY OF INSULAR LESION;  Surgeon: Vallarie Mare, MD;  Location: [facility];  Service: Neurosurgery;  Laterality: Right;  ? KNEE ARTHROSCOPY W/ MENISCAL REPAIR Right 06/23/1988  ? removal of birthmark  06/08/2013  ? SKIN CANCER EXCISION  06/23/2012  ? TYMPANOSTOMY TUBE PLACEMENT  08/06/1978  ? URETEROSCOPY WITH HOLMIUM LASER LITHOTRIPSY Right 01/25/2016  ? Procedure: URETEROSCOPY WITH HOLMIUM LASER LITHOTRIPSY;  Surgeon: Nickie Retort, MD;  Location: [facility];  Service: Urology;  Laterality: Right;  ? URETHRAL STRICTURE DILATATION    ? visual inspection of vocal cord    ? 1973  ? WISDOM TOOTH EXTRACTION    ? ?Social History:  ?Social History  ? ?Socioeconomic History  ? Marital status: Married  ?  Spouse name: Not on file  ? Number of children: Not on file  ? Years of education: Not on file  ? Highest education level: Not on file  ?Occupational History  ? Not on file  ?Tobacco Use  ? Smoking status: Never  ? Smokeless tobacco: Never  ?Vaping Use  ? Vaping Use: Never used  ?Substance and Sexual Activity  ? Alcohol use: Not Currently  ?  Comment: rare  ? Drug use: No  ? Sexual activity: Not on file  ?Other Topics Concern  ? Not on file  ?Social History Narrative  ? Not on file   ? ?Social Determinants of Health  ? ?Financial Resource Strain: Not on file  ?Food Insecurity: Not on file  ?Transportation Needs: Not on file  ?Physical Activity: Not on file  ?Stress: Not on file  ?Social Connections: Not on file  ?Intimate Partner Violence: Not on file  ? ?Family History:  ?Family History  ?Problem Relation Age of Onset  ? Hyperlipidemia Father   ? Hypertension Father   ? Diabetes Father   ? Benign prostatic hyperplasia Father   ? Stroke Maternal Grandmother   ? Diabetes Maternal Grandmother   ? Stroke Paternal Grandmother   ? Hypertension Paternal Grandmother   ? Kidney disease Neg Hx   ? Prostate cancer Neg Hx   ? ? ?Review of Systems: ?Constitutional: Doesn't report fevers, chills or abnormal weight loss ?Eyes: Doesn't report blurriness of vision ?Ears, nose, mouth, throat,  and face: Doesn't report sore throat ?Respiratory: Doesn't report cough, dyspnea or wheezes ?Cardiovascular: Doesn't report palpitation, chest discomfort  ?Gastrointestinal:  Doesn't report nausea, constipation, diarrhea ?GU: Doesn't report incontinence ?Skin: Doesn't report skin rashes ?Neurological: Per HPI ?Musculoskeletal: Doesn't report joint pain ?Behavioral/Psych: Doesn't report anxiety ? ?Physical Exam: ?Vitals:  ? 10/21/21 1215  ?BP: 130/88  ?Pulse: 75  ?Resp: 20  ?Temp: 97.7 ?F (36.5 ?C)  ?SpO2: 97%  ? ? ?KPS: 90. ?General: Fatigued ?Head: Normal ?EENT: No conjunctival injection or scleral icterus.  ?Lungs: Resp effort normal ?Cardiac: Regular rate ?Abdomen: Non-distended abdomen ?Skin: No rashes cyanosis or petechiae. ?Extremities: No clubbing or edema ? ?Neurologic Exam: ?Mental Status: Awake, alert, attentive to examiner. Oriented to self and environment. Language is fluent with intact comprehension.  Modest psychomotor slowing, impaired 3 object recall ?Cranial Nerves: Visual acuity is grossly normal. Visual fields are full. Extra-ocular movements intact. No ptosis. Face is symmetric ?Motor: Tone and bulk are  normal. Power is full in both arms and legs. Reflexes are symmetric, no pathologic reflexes present.  ?Sensory: Intact to light touch ?Gait: Normal. ? ? ?Labs: ?I have reviewed the data as listed ?

## 2021-10-22 ENCOUNTER — Encounter: Payer: Self-pay | Admitting: Internal Medicine

## 2021-10-22 ENCOUNTER — Telehealth: Payer: Self-pay | Admitting: Internal Medicine

## 2021-10-22 LAB — COMPLETE METABOLIC PANEL WITHOUT GFR
AG Ratio: 2 (calc) (ref 1.0–2.5)
ALT: 63 U/L — ABNORMAL HIGH (ref 9–46)
AST: 27 U/L (ref 10–40)
Albumin: 4.7 g/dL (ref 3.6–5.1)
Alkaline phosphatase (APISO): 93 U/L (ref 36–130)
BUN: 12 mg/dL (ref 7–25)
CO2: 30 mmol/L (ref 20–32)
Calcium: 9.6 mg/dL (ref 8.6–10.3)
Chloride: 97 mmol/L — ABNORMAL LOW (ref 98–110)
Creat: 0.84 mg/dL (ref 0.60–1.29)
Globulin: 2.4 g/dL (ref 1.9–3.7)
Glucose, Bld: 279 mg/dL — ABNORMAL HIGH (ref 65–139)
Potassium: 3.9 mmol/L (ref 3.5–5.3)
Sodium: 137 mmol/L (ref 135–146)
Total Bilirubin: 0.7 mg/dL (ref 0.2–1.2)
Total Protein: 7.1 g/dL (ref 6.1–8.1)
eGFR: 107 mL/min/1.73m2

## 2021-10-22 LAB — MICROALBUMIN / CREATININE URINE RATIO
Creatinine, Urine: 140 mg/dL (ref 20–320)
Microalb Creat Ratio: 6 ug/mg{creat}
Microalb, Ur: 0.8 mg/dL

## 2021-10-22 LAB — LIPID PANEL
Cholesterol: 164 mg/dL
HDL: 31 mg/dL — ABNORMAL LOW
LDL Cholesterol (Calc): 100 mg/dL — ABNORMAL HIGH
Non-HDL Cholesterol (Calc): 133 mg/dL — ABNORMAL HIGH
Total CHOL/HDL Ratio: 5.3 (calc) — ABNORMAL HIGH
Triglycerides: 212 mg/dL — ABNORMAL HIGH

## 2021-10-22 LAB — CBC
HCT: 47 % (ref 38.5–50.0)
Hemoglobin: 16.2 g/dL (ref 13.2–17.1)
MCH: 29.8 pg (ref 27.0–33.0)
MCHC: 34.5 g/dL (ref 32.0–36.0)
MCV: 86.6 fL (ref 80.0–100.0)
MPV: 9 fL (ref 7.5–12.5)
Platelets: 213 10*3/uL (ref 140–400)
RBC: 5.43 10*6/uL (ref 4.20–5.80)
RDW: 12.1 % (ref 11.0–15.0)
WBC: 6.5 10*3/uL (ref 3.8–10.8)

## 2021-10-22 LAB — HEMOGLOBIN A1C
Hgb A1c MFr Bld: 8.2 % of total Hgb — ABNORMAL HIGH (ref <5.7)
Mean Plasma Glucose: 189 mg/dL
eAG (mmol/L): 10.4 mmol/L

## 2021-10-22 LAB — HIV ANTIBODY (ROUTINE TESTING W REFLEX): HIV 1&2 Ab, 4th Generation: NONREACTIVE

## 2021-10-22 NOTE — Telephone Encounter (Signed)
Scheduled per 5/1 los, message has been left  ?

## 2021-10-23 ENCOUNTER — Other Ambulatory Visit (HOSPITAL_COMMUNITY): Payer: Self-pay

## 2021-10-23 ENCOUNTER — Other Ambulatory Visit: Payer: Self-pay

## 2021-10-23 NOTE — Telephone Encounter (Signed)
-----   Message from Jearld Fenton, NP sent at 10/22/2021 11:13 AM EDT ----- ?Blood counts are normal.  A1c still 8.2%, not good.  I think he should continue glipizide and increase his Januvia to 100 mg daily.  Please let me know if he is agreeable with this.  We really need to get his diabetes under better control.  Diabetes is not affecting his kidneys which is a good thing.  Cholesterol remains about the same.  Is he taking atorvastatin as prescribed?  If so, would he be agreeable to increasing this to 40 mg daily?  Liver enzymes are stable.  Kidney function is normal.  I would like him to follow-up with me in 3 months. ?

## 2021-10-23 NOTE — Telephone Encounter (Signed)
Pt advised.  He agreed to increase Januvia and atorvastatin.  Please send to [facility].  Apt scheduled for 01/29/2022.  ? ?Thanks,  ? ?-Mickel Baas  ?

## 2021-10-24 MED ORDER — SITAGLIPTIN PHOSPHATE 100 MG PO TABS: 100.0000 mg | ORAL_TABLET | Freq: Every day | ORAL | 0 refills | Status: DC

## 2021-10-24 MED ORDER — ATORVASTATIN CALCIUM 40 MG PO TABS: 40.0000 mg | ORAL_TABLET | Freq: Every day | ORAL | 0 refills | Status: DC

## 2021-10-25 ENCOUNTER — Other Ambulatory Visit (HOSPITAL_COMMUNITY): Payer: Self-pay

## 2021-11-19 ENCOUNTER — Other Ambulatory Visit (HOSPITAL_COMMUNITY): Payer: Self-pay

## 2021-11-19 ENCOUNTER — Other Ambulatory Visit: Payer: Self-pay | Admitting: Internal Medicine

## 2021-11-19 DIAGNOSIS — C719 Malignant neoplasm of brain, unspecified: Secondary | ICD-10-CM

## 2021-11-19 MED ORDER — ONDANSETRON HCL 8 MG PO TABS
8.0000 mg | ORAL_TABLET | Freq: Two times a day (BID) | ORAL | 1 refills | Status: DC | PRN
  Filled 2021-11-19: qty 18, 21d supply, fill #0

## 2021-12-09 ENCOUNTER — Inpatient Hospital Stay: Payer: BC Managed Care – PPO | Attending: Internal Medicine

## 2021-12-09 ENCOUNTER — Other Ambulatory Visit: Payer: Self-pay

## 2021-12-09 ENCOUNTER — Telehealth: Payer: Self-pay | Admitting: Internal Medicine

## 2021-12-09 ENCOUNTER — Other Ambulatory Visit (HOSPITAL_COMMUNITY): Payer: Self-pay

## 2021-12-09 ENCOUNTER — Other Ambulatory Visit: Payer: Self-pay | Admitting: Lab

## 2021-12-09 ENCOUNTER — Inpatient Hospital Stay: Payer: BC Managed Care – PPO | Admitting: Internal Medicine

## 2021-12-09 VITALS — BP 135/85 | HR 89 | Temp 97.7°F | Resp 20 | Wt 157.7 lb

## 2021-12-09 DIAGNOSIS — Z79899 Other long term (current) drug therapy: Secondary | ICD-10-CM | POA: Insufficient documentation

## 2021-12-09 DIAGNOSIS — Z9221 Personal history of antineoplastic chemotherapy: Secondary | ICD-10-CM | POA: Insufficient documentation

## 2021-12-09 DIAGNOSIS — C711 Malignant neoplasm of frontal lobe: Secondary | ICD-10-CM | POA: Diagnosis not present

## 2021-12-09 DIAGNOSIS — C719 Malignant neoplasm of brain, unspecified: Secondary | ICD-10-CM

## 2021-12-09 DIAGNOSIS — R569 Unspecified convulsions: Secondary | ICD-10-CM | POA: Insufficient documentation

## 2021-12-09 DIAGNOSIS — Z923 Personal history of irradiation: Secondary | ICD-10-CM | POA: Diagnosis not present

## 2021-12-09 LAB — CBC WITH DIFFERENTIAL (CANCER CENTER ONLY)
Abs Immature Granulocytes: 0.02 10*3/uL (ref 0.00–0.07)
Basophils Absolute: 0 10*3/uL (ref 0.0–0.1)
Basophils Relative: 1 %
Eosinophils Absolute: 0.3 10*3/uL (ref 0.0–0.5)
Eosinophils Relative: 4 %
HCT: 45.2 % (ref 39.0–52.0)
Hemoglobin: 16.2 g/dL (ref 13.0–17.0)
Immature Granulocytes: 0 %
Lymphocytes Relative: 13 %
Lymphs Abs: 0.9 10*3/uL (ref 0.7–4.0)
MCH: 29.8 pg (ref 26.0–34.0)
MCHC: 35.8 g/dL (ref 30.0–36.0)
MCV: 83.2 fL (ref 80.0–100.0)
Monocytes Absolute: 0.5 10*3/uL (ref 0.1–1.0)
Monocytes Relative: 7 %
Neutro Abs: 4.9 10*3/uL (ref 1.7–7.7)
Neutrophils Relative %: 75 %
Platelet Count: 236 10*3/uL (ref 150–400)
RBC: 5.43 MIL/uL (ref 4.22–5.81)
RDW: 12.4 % (ref 11.5–15.5)
WBC Count: 6.6 10*3/uL (ref 4.0–10.5)
nRBC: 0 % (ref 0.0–0.2)

## 2021-12-09 LAB — CMP (CANCER CENTER ONLY)
ALT: 32 U/L (ref 0–44)
AST: 17 U/L (ref 15–41)
Albumin: 4.5 g/dL (ref 3.5–5.0)
Alkaline Phosphatase: 123 U/L (ref 38–126)
Anion gap: 6 (ref 5–15)
BUN: 9 mg/dL (ref 6–20)
CO2: 28 mmol/L (ref 22–32)
Calcium: 9.6 mg/dL (ref 8.9–10.3)
Chloride: 103 mmol/L (ref 98–111)
Creatinine: 0.82 mg/dL (ref 0.61–1.24)
GFR, Estimated: >60 mL/min (ref >60)
Glucose, Bld: 280 mg/dL — ABNORMAL HIGH (ref 70–99)
Potassium: 4.2 mmol/L (ref 3.5–5.1)
Sodium: 137 mmol/L (ref 135–145)
Total Bilirubin: 0.5 mg/dL (ref 0.3–1.2)
Total Protein: 7.2 g/dL (ref 6.5–8.1)

## 2021-12-09 MED ORDER — TEMOZOLOMIDE 140 MG PO CAPS
150.0000 mg/m2/d | ORAL_CAPSULE | Freq: Every day | ORAL | 0 refills | Status: AC
  Filled 2021-12-09: qty 14, 7d supply, fill #0
  Filled 2021-12-10 (×2): qty 10, 5d supply, fill #0

## 2021-12-09 NOTE — Telephone Encounter (Signed)
Per 6/19 los called and spoke to pt about appointment.  Pt confirmed appointment

## 2021-12-09 NOTE — Progress Notes (Signed)
[facility] at [place], [place] [address] [phone]  Interval Evaluation  Date of Service: 12/09/21 Patient Name: Alan Wise Patient MRN: [id number] Patient DOB: 06-Nov-1970 Provider: Ventura Sellers, MD  Identifying Statement:  Alan Wise is a 51 y.o. male with R frontal oligodendroglioma   Oncology History  Oligodendroglioma ([place])  01/17/2021 Surgery   Stereotactic biopsy with Dr. Marcello Moores; path demonstrates IDH-1 mutant oligodendroglioma   03/20/2021 - 03/20/2021 Chemotherapy   Patient is on Treatment Plan : BRAIN Low Grade Glioma Grade II, Anaplastic Glioma Grade III, Radiation Therapy with Concurrent Temozolomide 40m/m2 Daily Followed by Sequential Maintenance Temozolomide     06/02/2021 -  Chemotherapy   Patient is on Treatment Plan : BRAIN ANAPLASTIC GLIOMA GRADE III Temozolomide Post XRT q28d       Biomarkers:  MGMT Unknown.  IDH 1/2 Mutated.  1p/19q Co-deleted  TERT Unknown   Interval History: Alan Wise today for follow up, now having completed cycle #5 of adjuvant 5-day Temodar.  No new or progressive deficits today.  Still working full time.  No issues with gait or left sided weakness, no further seizures.  H+P (12/31/20) Patient presented to medical attention this past month, with complaint of involuntary left sided shaking.  He describes episodes, occurring several times per week, of left arm shaking, lasting up to 5 minutes; following the episodes he describes the arm as "heavy" for some time before returning to normal.  He also describes episodes of sudden inability to speak, with strange movements of face and hands, lasting same amount of time.  These episodes go back ~10 years in very sporadic nature, but have become much more frequent in recent months.  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  These episodes have occurred at work, and have been disruptive in that  environment.  He is otherwise fully functional, independent.  Medications: Current Outpatient Medications on File Prior to Visit  Medication Sig Dispense Refill   atorvastatin (LIPITOR) 40 MG tablet Take 1 tablet (40 mg total) by mouth daily. 90 tablet 0   docusate sodium (COLACE) 100 MG capsule Take 1 capsule (100 mg total) by mouth 2 (two) times daily. 60 capsule 2   FREESTYLE LITE test strip USE TWO TIMES DAILY TO TEST BLOOD SUGAR 50 each 11   glipiZIDE (GLUCOTROL) 10 MG tablet Take 1 tablet (10 mg total) by mouth 2 (two) times daily. 180 tablet 1   levETIRAcetam (KEPPRA) 500 MG tablet Take 1 tablet (500 mg total) by mouth 2 (two) times daily. 60 tablet 3   ondansetron (ZOFRAN) 8 MG tablet Take 1 tablet (8 mg total) by mouth 2 (two) times daily as needed (nausea and vomiting). May take 30-60 minutes prior to Temodar administration if nausea/vomiting occurs. 30 tablet 1   prednisoLONE acetate (PRED FORTE) 1 % ophthalmic suspension Place 1 drop into both eyes 2 (two) times daily.     sitaGLIPtin (JANUVIA) 100 MG tablet Take 1 tablet (100 mg total) by mouth daily. 90 tablet 0   traZODone (DESYREL) 50 MG tablet Take 1 tablet (50 mg total) by mouth at bedtime. (Patient not taking: Reported on 12/09/2021) 60 tablet 1   No current facility-administered medications on file prior to visit.    Allergies:  Allergies  Allergen Reactions   Contrast Media [Iodinated Contrast Media] Swelling   Past Medical History:  Past Medical History:  Diagnosis Date   Allergy  Complication of anesthesia    pt reports he is starting to have more difficulty arousing after surgery   Diabetes mellitus ([place])    GERD (gastroesophageal reflux disease)    Headache    History of kidney stones    Hyperlipidemia    Renal disorder    kidney stones   Past Surgical History:  Past Surgical History:  Procedure Laterality Date   APPLICATION OF CRANIAL NAVIGATION Right 01/17/2021   Procedure: APPLICATION OF CRANIAL  NAVIGATION;  Surgeon: Vallarie Mare, MD;  Location: [facility];  Service: Neurosurgery;  Laterality: Right;   COLONOSCOPY  09/03/1994   CYSTOSCOPY WITH STENT PLACEMENT Right 01/25/2016   Procedure: CYSTOSCOPY WITH STENT PLACEMENT;  Surgeon: Nickie Retort, MD;  Location: [facility];  Service: Urology;  Laterality: Right;   FRAMELESS  BIOPSY WITH BRAINLAB Right 01/17/2021   Procedure: RIGHT STEREOTACTIC BIOPSY OF INSULAR LESION;  Surgeon: Vallarie Mare, MD;  Location: [facility];  Service: Neurosurgery;  Laterality: Right;   KNEE ARTHROSCOPY W/ MENISCAL REPAIR Right 06/23/1988   removal of birthmark  06/08/2013   SKIN CANCER EXCISION  06/23/2012   TYMPANOSTOMY TUBE PLACEMENT  08/06/1978   URETEROSCOPY WITH HOLMIUM LASER LITHOTRIPSY Right 01/25/2016   Procedure: URETEROSCOPY WITH HOLMIUM LASER LITHOTRIPSY;  Surgeon: Nickie Retort, MD;  Location: [facility];  Service: Urology;  Laterality: Right;   URETHRAL STRICTURE DILATATION     visual inspection of vocal cord     1973   WISDOM TOOTH EXTRACTION     Social History:  Social History   Socioeconomic History   Marital status: Married    Spouse name: Not on file   Number of children: Not on file   Years of education: Not on file   Highest education level: Not on file  Occupational History   Not on file  Tobacco Use   Smoking status: Never   Smokeless tobacco: Never  Vaping Use   Vaping Use: Never used  Substance and Sexual Activity   Alcohol use: Not Currently    Comment: rare   Drug use: No   Sexual activity: Not on file  Other Topics Concern   Not on file  Social History Narrative   Not on file   Social Determinants of Health   Financial Resource Strain: Not on file  Food Insecurity: Not on file  Transportation Needs: Not on file  Physical Activity: Not on file  Stress: Not on file  Social Connections: Not on file  Intimate Partner Violence: Not on file   Family History:  Family History  Problem Relation Age of  Onset   Hyperlipidemia Father    Hypertension Father    Diabetes Father    Benign prostatic hyperplasia Father    Stroke Maternal Grandmother    Diabetes Maternal Grandmother    Stroke Paternal Grandmother    Hypertension Paternal Grandmother    Kidney disease Neg Hx    Prostate cancer Neg Hx     Review of Systems: Constitutional: Doesn't report fevers, chills or abnormal weight loss Eyes: Doesn't report blurriness of vision Ears, nose, mouth, throat, and face: Doesn't report sore throat Respiratory: Doesn't report cough, dyspnea or wheezes Cardiovascular: Doesn't report palpitation, chest discomfort  Gastrointestinal:  Doesn't report nausea, constipation, diarrhea GU: Doesn't report incontinence Skin: Doesn't report skin rashes Neurological: Per HPI Musculoskeletal: Doesn't report joint pain Behavioral/Psych: Doesn't report anxiety  Physical Exam: Vitals:   12/09/21 1050  BP: 135/85  Pulse: 89  Resp: 20  Temp: 97.7  F (36.5 C)  SpO2: 98%    KPS: 90. General: Fatigued Head: Normal EENT: No conjunctival injection or scleral icterus.  Lungs: Resp effort normal Cardiac: Regular rate Abdomen: Non-distended abdomen Skin: No rashes cyanosis or petechiae. Extremities: No clubbing or edema  Neurologic Exam: Mental Status: Awake, alert, attentive to examiner. Oriented to self and environment. Language is fluent with intact comprehension.  Modest psychomotor slowing, impaired 3 object recall Cranial Nerves: Visual acuity is grossly normal. Visual fields are full. Extra-ocular movements intact. No ptosis. Face is symmetric Motor: Tone and bulk are normal. Power is full in both arms and legs. Reflexes are symmetric, no pathologic reflexes present.  Sensory: Intact to light touch Gait: Normal.   Labs: I have reviewed the data as listed    Component Value Date/Time   NA 137 10/21/2021 1457   NA 138 05/04/2012 1815   K 3.9 10/21/2021 1457   K 3.5 05/04/2012 1815   CL  97 (L) 10/21/2021 1457   CL 99 05/04/2012 1815   CO2 30 10/21/2021 1457   CO2 29 05/04/2012 1815   GLUCOSE 279 (H) 10/21/2021 1457   GLUCOSE 69 05/04/2012 1815   BUN 12 10/21/2021 1457   BUN 12 05/04/2012 1815   CREATININE 0.84 10/21/2021 1457   CREATININE 0.85 10/21/2021 1153   CALCIUM 9.6 10/21/2021 1457   CALCIUM 9.2 05/04/2012 1815   PROT 7.1 10/21/2021 1457   PROT 8.5 (H) 05/04/2012 1815   ALBUMIN 4.6 10/21/2021 1153   ALBUMIN 4.5 05/04/2012 1815   AST 27 10/21/2021 1457   AST 27 10/21/2021 1153   ALT 63 (H) 10/21/2021 1457   ALT 64 (H) 10/21/2021 1153   ALT 77 05/04/2012 1815   ALKPHOS 91 10/21/2021 1153   ALKPHOS 129 05/04/2012 1815   BILITOT 0.7 10/21/2021 1457   BILITOT 0.6 10/21/2021 1153   GFRNONAA >60 10/21/2021 1153   GFRNONAA 106 12/05/2020 1014   GFRAA 123 12/05/2020 1014   Lab Results  Component Value Date   WBC 6.6 12/09/2021   NEUTROABS 4.9 12/09/2021   HGB 16.2 12/09/2021   HCT 45.2 12/09/2021   MCV 83.2 12/09/2021   PLT 236 12/09/2021    Assessment/Plan Oligodendroglioma ([place])  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  Labs are within normal limits today, no new or progressive changes.  We recommended continuing treatment with cycle #6 Temozolomide, still at 179m/m2, on for five days and off for twenty three days in twenty eight day cycles. The patient will have a complete blood count performed on days 21 and 28 of each cycle, and a comprehensive metabolic panel performed on day 28 of each cycle. Labs may need to be performed more often. Zofran will prescribed for home use for nausea/vomiting.   Chemotherapy should be held for the following:  ANC less than 1,000  Platelets less than 100,000  LFT or creatinine greater than 2x ULN  If clinical concerns/contraindications develop  Will con't Keppra 503mBID, he is seizure free currently.  Ok with Trazodone 5030mS  for sleep issues.  We ask that Alan VIDRIOturn to clinic in 1 months prior to cycle #6 with MRI brain for evaluation.  T/C stopping therapy if imaging stable.  All questions were answered. The patient knows to call the clinic with any problems, questions or concerns. No barriers to learning were detected.  The total time spent in the encounter was 30 minutes  and more than 50% was on counseling and review of test results   Ventura Sellers, MD Medical Director of Neuro-Oncology [facility] at [place] 12/09/21 11:14 AM

## 2021-12-10 ENCOUNTER — Other Ambulatory Visit (HOSPITAL_COMMUNITY): Payer: Self-pay

## 2021-12-10 ENCOUNTER — Telehealth: Payer: Self-pay | Admitting: Internal Medicine

## 2021-12-10 ENCOUNTER — Other Ambulatory Visit: Payer: Self-pay | Admitting: Internal Medicine

## 2021-12-10 NOTE — Telephone Encounter (Signed)
Per 6/19 los called and left message for pt about appointment in 4 weeks.  Details and call back number were left

## 2021-12-13 ENCOUNTER — Other Ambulatory Visit (HOSPITAL_COMMUNITY): Payer: Self-pay

## 2021-12-31 ENCOUNTER — Other Ambulatory Visit: Payer: Self-pay | Admitting: Internal Medicine

## 2022-01-01 ENCOUNTER — Other Ambulatory Visit (HOSPITAL_COMMUNITY): Payer: Self-pay

## 2022-01-02 ENCOUNTER — Ambulatory Visit (HOSPITAL_COMMUNITY)
Admission: RE | Admit: 2022-01-02 | Discharge: 2022-01-02 | Disposition: A | Discharge status: Home / Self Care | Payer: BC Managed Care – PPO | Source: Ambulatory Visit | Attending: Internal Medicine | Admitting: Internal Medicine

## 2022-01-02 DIAGNOSIS — C719 Malignant neoplasm of brain, unspecified: Secondary | ICD-10-CM | POA: Diagnosis present

## 2022-01-02 MED ORDER — GADOBUTROL 1 MMOL/ML IV SOLN
7.0000 mL | Freq: Once | INTRAVENOUS | Status: AC | PRN
  Administered 2022-01-02: 7 mL via INTRAVENOUS

## 2022-01-03 ENCOUNTER — Other Ambulatory Visit: Payer: Self-pay | Admitting: Radiation Therapy

## 2022-01-06 ENCOUNTER — Inpatient Hospital Stay: Payer: BC Managed Care – PPO | Attending: Internal Medicine | Admitting: Internal Medicine

## 2022-01-06 ENCOUNTER — Other Ambulatory Visit: Payer: Self-pay

## 2022-01-06 ENCOUNTER — Inpatient Hospital Stay: Payer: BC Managed Care – PPO

## 2022-01-06 ENCOUNTER — Other Ambulatory Visit (HOSPITAL_COMMUNITY): Payer: Self-pay

## 2022-01-06 VITALS — BP 145/89 | HR 83 | Temp 97.3°F | Resp 16 | Wt 156.9 lb

## 2022-01-06 DIAGNOSIS — Z87442 Personal history of urinary calculi: Secondary | ICD-10-CM | POA: Insufficient documentation

## 2022-01-06 DIAGNOSIS — R569 Unspecified convulsions: Secondary | ICD-10-CM | POA: Diagnosis not present

## 2022-01-06 DIAGNOSIS — Z7984 Long term (current) use of oral hypoglycemic drugs: Secondary | ICD-10-CM | POA: Diagnosis not present

## 2022-01-06 DIAGNOSIS — E785 Hyperlipidemia, unspecified: Secondary | ICD-10-CM | POA: Diagnosis not present

## 2022-01-06 DIAGNOSIS — Z9221 Personal history of antineoplastic chemotherapy: Secondary | ICD-10-CM | POA: Insufficient documentation

## 2022-01-06 DIAGNOSIS — Z79899 Other long term (current) drug therapy: Secondary | ICD-10-CM | POA: Insufficient documentation

## 2022-01-06 DIAGNOSIS — I6310 Cerebral infarction due to embolism of unspecified precerebral artery: Secondary | ICD-10-CM | POA: Diagnosis not present

## 2022-01-06 DIAGNOSIS — C719 Malignant neoplasm of brain, unspecified: Secondary | ICD-10-CM | POA: Diagnosis not present

## 2022-01-06 DIAGNOSIS — E119 Type 2 diabetes mellitus without complications: Secondary | ICD-10-CM | POA: Insufficient documentation

## 2022-01-06 DIAGNOSIS — K219 Gastro-esophageal reflux disease without esophagitis: Secondary | ICD-10-CM | POA: Diagnosis not present

## 2022-01-06 DIAGNOSIS — C711 Malignant neoplasm of frontal lobe: Secondary | ICD-10-CM | POA: Diagnosis present

## 2022-01-06 MED ORDER — TEMOZOLOMIDE 140 MG PO CAPS
150.0000 mg/m2/d | ORAL_CAPSULE | Freq: Every day | ORAL | 0 refills | Status: AC
  Filled 2022-01-06 – 2022-01-09 (×2): qty 10, 5d supply, fill #0

## 2022-01-06 NOTE — Progress Notes (Signed)
[place] [facility] [place], [place] [address] [phone]  Interval Evaluation  Date of Service: 01/06/22 Patient Name: Alan Wise Patient MRN: [id number] Patient DOB: 1971-01-09 Provider: Ventura Sellers, MD  Identifying Statement:  Alan Wise is a 51 y.o. male with R frontal oligodendroglioma   Oncology History  Oligodendroglioma ([place])  01/17/2021 Surgery   Stereotactic biopsy with Dr. Marcello Moores; path demonstrates IDH-1 mutant oligodendroglioma   03/20/2021 - 03/20/2021 Chemotherapy   Patient is on Treatment Plan : BRAIN Low Grade Glioma Grade II, Anaplastic Glioma Grade III, Radiation Therapy with Concurrent Temozolomide 40m/m2 Daily Followed by Sequential Maintenance Temozolomide     06/02/2021 -  Chemotherapy   Patient is on Treatment Plan : BRAIN ANAPLASTIC GLIOMA GRADE III Temozolomide Post XRT q28d       Biomarkers:  MGMT Unknown.  IDH 1/2 Mutated.  1p/19q Co-deleted  TERT Unknown   Interval History: Alan OROSZpresents today for follow up, now having completed cycle #6 of adjuvant 5-day Temodar and MRI brain.  No new or progressive deficits today, ongoing issues with short term memory as prior.  Still working full time.  No issues with gait or left sided weakness, no further seizures.  H+P (12/31/20) Patient presented to medical attention this past month, with complaint of involuntary left sided shaking.  He describes episodes, occurring several times per week, of left arm shaking, lasting up to 5 minutes; following the episodes he describes the arm as "heavy" for some time before returning to normal.  He also describes episodes of sudden inability to speak, with strange movements of face and hands, lasting same amount of time.  These episodes go back ~10 years in very sporadic nature, but have become much more frequent in recent months.  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  These  episodes have occurred at work, and have been disruptive in that environment.  He is otherwise fully functional, independent.  Medications: Current Outpatient Medications on File Prior to Visit  Medication Sig Dispense Refill   atorvastatin (LIPITOR) 40 MG tablet Take 1 tablet (40 mg total) by mouth daily. 90 tablet 0   docusate sodium (COLACE) 100 MG capsule Take 1 capsule (100 mg total) by mouth 2 (two) times daily. 60 capsule 2   FREESTYLE LITE test strip USE TWO TIMES DAILY TO TEST BLOOD SUGAR 50 each 11   glipiZIDE (GLUCOTROL) 10 MG tablet Take 1 tablet (10 mg total) by mouth 2 (two) times daily. 180 tablet 1   levETIRAcetam (KEPPRA) 500 MG tablet TAKE 1 TABLET BY MOUTH TWICE A DAY 60 tablet 3   ondansetron (ZOFRAN) 8 MG tablet Take 1 tablet (8 mg total) by mouth 2 (two) times daily as needed (nausea and vomiting). May take 30-60 minutes prior to Temodar administration if nausea/vomiting occurs. 30 tablet 1   prednisoLONE acetate (PRED FORTE) 1 % ophthalmic suspension Place 1 drop into both eyes 2 (two) times daily.     sitaGLIPtin (JANUVIA) 100 MG tablet Take 1 tablet (100 mg total) by mouth daily. 90 tablet 0   No current facility-administered medications on file prior to visit.    Allergies:  Allergies  Allergen Reactions   Contrast Media [Iodinated Contrast Media] Swelling   Past Medical History:  Past Medical History:  Diagnosis Date   Allergy    Complication of anesthesia    pt reports he is starting to have more difficulty arousing  after surgery   Diabetes mellitus ([place])    GERD (gastroesophageal reflux disease)    Headache    History of kidney stones    Hyperlipidemia    Renal disorder    kidney stones   Past Surgical History:  Past Surgical History:  Procedure Laterality Date   APPLICATION OF CRANIAL NAVIGATION Right 01/17/2021   Procedure: APPLICATION OF CRANIAL NAVIGATION;  Surgeon: Vallarie Mare, MD;  Location: [facility];  Service: Neurosurgery;  Laterality:  Right;   COLONOSCOPY  09/03/1994   CYSTOSCOPY WITH STENT PLACEMENT Right 01/25/2016   Procedure: CYSTOSCOPY WITH STENT PLACEMENT;  Surgeon: Nickie Retort, MD;  Location: [facility];  Service: Urology;  Laterality: Right;   FRAMELESS  BIOPSY WITH BRAINLAB Right 01/17/2021   Procedure: RIGHT STEREOTACTIC BIOPSY OF INSULAR LESION;  Surgeon: Vallarie Mare, MD;  Location: [facility];  Service: Neurosurgery;  Laterality: Right;   KNEE ARTHROSCOPY W/ MENISCAL REPAIR Right 06/23/1988   removal of birthmark  06/08/2013   SKIN CANCER EXCISION  06/23/2012   TYMPANOSTOMY TUBE PLACEMENT  08/06/1978   URETEROSCOPY WITH HOLMIUM LASER LITHOTRIPSY Right 01/25/2016   Procedure: URETEROSCOPY WITH HOLMIUM LASER LITHOTRIPSY;  Surgeon: Nickie Retort, MD;  Location: [facility];  Service: Urology;  Laterality: Right;   URETHRAL STRICTURE DILATATION     visual inspection of vocal cord     1973   WISDOM TOOTH EXTRACTION     Social History:  Social History   Socioeconomic History   Marital status: Married    Spouse name: Not on file   Number of children: Not on file   Years of education: Not on file   Highest education level: Not on file  Occupational History   Not on file  Tobacco Use   Smoking status: Never   Smokeless tobacco: Never  Vaping Use   Vaping Use: Never used  Substance and Sexual Activity   Alcohol use: Not Currently    Comment: rare   Drug use: No   Sexual activity: Not on file  Other Topics Concern   Not on file  Social History Narrative   Not on file   Social Determinants of Health   Financial Resource Strain: Not on file  Food Insecurity: Not on file  Transportation Needs: Not on file  Physical Activity: Not on file  Stress: Not on file  Social Connections: Not on file  Intimate Partner Violence: Not on file   Family History:  Family History  Problem Relation Age of Onset   Hyperlipidemia Father    Hypertension Father    Diabetes Father    Benign prostatic  hyperplasia Father    Stroke Maternal Grandmother    Diabetes Maternal Grandmother    Stroke Paternal Grandmother    Hypertension Paternal Grandmother    Kidney disease Neg Hx    Prostate cancer Neg Hx     Review of Systems: Constitutional: Doesn't report fevers, chills or abnormal weight loss Eyes: Doesn't report blurriness of vision Ears, nose, mouth, throat, and face: Doesn't report sore throat Respiratory: Doesn't report cough, dyspnea or wheezes Cardiovascular: Doesn't report palpitation, chest discomfort  Gastrointestinal:  Doesn't report nausea, constipation, diarrhea GU: Doesn't report incontinence Skin: Doesn't report skin rashes Neurological: Per HPI Musculoskeletal: Doesn't report joint pain Behavioral/Psych: Doesn't report anxiety  Physical Exam: Vitals:   01/06/22 1035  BP: (!) 145/89  Pulse: 83  Resp: 16  Temp: (!) 97.3 F (36.3 C)  SpO2: 98%    KPS: 90. General: Fatigued Head:  Normal EENT: No conjunctival injection or scleral icterus.  Lungs: Resp effort normal Cardiac: Regular rate Abdomen: Non-distended abdomen Skin: No rashes cyanosis or petechiae. Extremities: No clubbing or edema  Neurologic Exam: Mental Status: Awake, alert, attentive to examiner. Oriented to self and environment. Language is fluent with intact comprehension.  Modest psychomotor slowing, impaired 3 object recall Cranial Nerves: Visual acuity is grossly normal. Visual fields are full. Extra-ocular movements intact. No ptosis. Face is symmetric Motor: Tone and bulk are normal. Power is full in both arms and legs. Reflexes are symmetric, no pathologic reflexes present.  Sensory: Intact to light touch Gait: Normal.   Labs: I have reviewed the data as listed    Component Value Date/Time   NA 137 12/09/2021 1044   NA 138 05/04/2012 1815   K 4.2 12/09/2021 1044   K 3.5 05/04/2012 1815   CL 103 12/09/2021 1044   CL 99 05/04/2012 1815   CO2 28 12/09/2021 1044   CO2 29  05/04/2012 1815   GLUCOSE 280 (H) 12/09/2021 1044   GLUCOSE 69 05/04/2012 1815   BUN 9 12/09/2021 1044   BUN 12 05/04/2012 1815   CREATININE 0.82 12/09/2021 1044   CREATININE 0.84 10/21/2021 1457   CALCIUM 9.6 12/09/2021 1044   CALCIUM 9.2 05/04/2012 1815   PROT 7.2 12/09/2021 1044   PROT 8.5 (H) 05/04/2012 1815   ALBUMIN 4.5 12/09/2021 1044   ALBUMIN 4.5 05/04/2012 1815   AST 17 12/09/2021 1044   ALT 32 12/09/2021 1044   ALT 77 05/04/2012 1815   ALKPHOS 123 12/09/2021 1044   ALKPHOS 129 05/04/2012 1815   BILITOT 0.5 12/09/2021 1044   GFRNONAA >60 12/09/2021 1044   GFRNONAA 106 12/05/2020 1014   GFRAA 123 12/05/2020 1014   Lab Results  Component Value Date   WBC 6.6 12/09/2021   NEUTROABS 4.9 12/09/2021   HGB 16.2 12/09/2021   HCT 45.2 12/09/2021   MCV 83.2 12/09/2021   PLT 236 12/09/2021   Imaging:  [facility] Clinician Interpretation: I have personally reviewed the CNS images as listed.  My interpretation, in the context of the patient's clinical presentation, is progressive disease  MR BRAIN W WO CONTRAST  Result Date: 01/03/2022 CLINICAL DATA:  Oligodendroglioma. Assess treatment response. EXAM: MRI HEAD WITHOUT AND WITH CONTRAST TECHNIQUE: Multiplanar, multiecho pulse sequences of the brain and surrounding structures were obtained without and with intravenous contrast. CONTRAST:  11m GADAVIST GADOBUTROL 1 MMOL/ML IV SOLN COMPARISON:  MR head without and with contrast 09/12/2021 and 05/25/2021 FINDINGS: Brain: The oligodendroglioma in the right insula is stable to decreased in size. Transverse diameter is now 17 mm compared with 20 mm in December. No associated enhancement is present. New foci of enhancement are present. Focal append mole or subependymal enhancement is present along the inferior right lateral ventricle just lateral to the hippocampus. Focus of enhancement measuring 7 mm is present along the medial aspect of the right hippocampus. T2 and FLAIR signal changes are  associated with the more lateral focus of enhancement. A 3 mm focus of enhancement is also present in the anterior right frontal lobe cortex on axial image 98 of series 16. Slight T2 hyperintensities associated. Two foci of enhancement are present in the right temporal tip with associated T2 signal change. The larger measures 5 mm. A 4 mm focus of enhancement present in the subcortical right superior temporal gyrus on axial image 81. A 5 mm focus of enhancement is present in the medial inferior left frontal lobe on image  78 series 16. Minimal periventricular white matter changes are present. Ventricles are of normal size. No significant extra-axial fluid collection is present. The internal auditory canals are within normal limits. The brainstem and cerebellum are within normal limits. Vascular: Flow is present in the major intracranial arteries. Skull and upper cervical spine: The craniocervical junction is normal. Upper cervical spine is within normal limits. Marrow signal is unremarkable. Sinuses/Orbits: The paranasal sinuses and mastoid air cells are clear. The globes and orbits are within normal limits. IMPRESSION: 1. Stable to slight decrease in size of oligodendroglioma in the right insula without associated enhancement. 2. New foci of enhancement in the right temporal lobe, right frontal lobe, and medial inferior left frontal lobe concerning for metastatic disease. These could represent small infarcts or septic emboli. Electronically Signed   By: San Morelle M.D.   On: 01/03/2022 19:33    Assessment/Plan Oligodendroglioma ([place]) - Plan: MR TOTAL SPINE METS SCREENING, DG FL GUIDED LUMBAR PUNCTURE, temozolomide (TEMODAR) 140 MG capsule, ECHOCARDIOGRAM COMPLETE BUBBLE STUDY  Cerebrovascular accident (CVA) due to embolism of precerebral artery ([place]) - Plan: ECHOCARDIOGRAM COMPLETE BUBBLE STUDY  Focal seizures (HCC)  Adrien Shankar Silguero is clinically stable today, now having completed cycle #6 of  5-day Temodar.  MRI brain demonstrates multiple new small foci of enhancement within the right hemisphere, including 2 adjacent to the lateral ventricle.  Etiology is unclear; parenchymal progression of disease, leptomeningeal dissemination and progression, cardioembolism, atypical radioinflammatory syndrome are within consideration, respectively.    We recommended the following: -MRI total spine mets screening study -LP for routine studies and cytology -TTE given some suspicion for cardioembolic process per discussion with radiology -Send tissue to [facility] for sequencing data -One additional cycle of 5-day TMZ -Repeat MRI brain in 1 month  We recommended continuing treatment with cycle #7 Temozolomide, still at 160m/m2, on for five days and off for twenty three days in twenty eight day cycles. The patient will have a complete blood count performed on days 21 and 28 of each cycle, and a comprehensive metabolic panel performed on day 28 of each cycle. Labs may need to be performed more often. Zofran will prescribed for home use for nausea/vomiting.   Chemotherapy should be held for the following:  ANC less than 1,000  Platelets less than 100,000  LFT or creatinine greater than 2x ULN  If clinical concerns/contraindications develop  Will con't Keppra 5033mBID, he is seizure free currently.  Remain off steroids as long as no new or progressive symptoms.  Ok with Trazodone 5041mS for sleep issues.  We ask that JonSHARIQ PUIGturn to clinic in 1 months with MRI brain for evaluation.   All questions were answered. The patient knows to call the clinic with any problems, questions or concerns. No barriers to learning were detected.  The total time spent in the encounter was 40 minutes and more than 50% was on counseling and review of test results   ZacVentura SellersD Medical Director of Neuro-Oncology [facility] [place]/17/23 3:52 PM

## 2022-01-07 ENCOUNTER — Telehealth: Payer: Self-pay | Admitting: *Deleted

## 2022-01-07 ENCOUNTER — Other Ambulatory Visit: Payer: Self-pay | Admitting: Radiation Therapy

## 2022-01-07 ENCOUNTER — Telehealth: Payer: Self-pay | Admitting: Internal Medicine

## 2022-01-07 NOTE — Telephone Encounter (Signed)
Per 7/17 los called about appointment  pt confirmed appointment

## 2022-01-07 NOTE — Telephone Encounter (Signed)
Attempted to reach patient to relay scheduling of [facility].  Spoke with wife.    Patient to arrive to [facility] on 07/26 @ 8:45.  Entrance C (Heart and Vascular on [address]).

## 2022-01-09 ENCOUNTER — Other Ambulatory Visit (HOSPITAL_COMMUNITY): Payer: Self-pay

## 2022-01-09 ENCOUNTER — Ambulatory Visit (HOSPITAL_COMMUNITY)
Admission: RE | Admit: 2022-01-09 | Discharge: 2022-01-09 | Disposition: A | Discharge status: Home / Self Care | Payer: BC Managed Care – PPO | Source: Ambulatory Visit | Attending: Internal Medicine | Admitting: Internal Medicine

## 2022-01-09 DIAGNOSIS — C719 Malignant neoplasm of brain, unspecified: Secondary | ICD-10-CM | POA: Insufficient documentation

## 2022-01-09 LAB — CSF CELL COUNT WITH DIFFERENTIAL
RBC Count, CSF: 1 /mm3 — ABNORMAL HIGH
Tube #: 3
WBC, CSF: 3 /mm3 (ref 0–5)

## 2022-01-09 LAB — PROTEIN, CSF: Total  Protein, CSF: 118 mg/dL — ABNORMAL HIGH (ref 15–45)

## 2022-01-09 LAB — GLUCOSE, CSF: Glucose, CSF: 126 mg/dL — ABNORMAL HIGH (ref 40–70)

## 2022-01-09 MED ORDER — LIDOCAINE HCL (PF) 1 % IJ SOLN
5.0000 mL | Freq: Once | INTRAMUSCULAR | Status: AC
  Administered 2022-01-09: 5 mL via INTRADERMAL

## 2022-01-09 NOTE — Discharge Instructions (Signed)
Myelogram and Lumbar Puncture Discharge Instructions  Go home and rest quietly for the next 24 hours.  It is important to lie flat for the next 24 hours.  Get up only to go to the restroom.  You may lie in the bed or on a couch on your back, your stomach, your left side or your right side.  You may have one pillow under your head.  You may have pillows between your knees while you are on your side or under your knees while you are on your back.  DO NOT drive today.  Recline the seat as far back as it will go, while still wearing your seat belt, on the way home.  You may get up to go to the bathroom as needed.  You may sit up for 10 minutes to eat.  You may resume your normal diet and medications unless otherwise indicated.  The incidence of headache, nausea, or vomiting is about 5% (one in 20 patients).  If you develop a headache, lie flat and drink plenty of fluids until the headache goes away.  Caffeinated beverages may be helpful.  If you develop severe nausea and vomiting or a headache that does not go away with flat bed rest, call [phone].  You may resume normal activities after your 24 hours of bed rest is over; however, do not exert yourself strongly or do any heavy lifting tomorrow.  Call your physician for a follow-up appointment.  The results of your myelogram will be sent directly to your physician by the following day.  Discharge instructions have been explained to the patient.  The patient, or the person responsible for the patient, fully understands these instructions.

## 2022-01-10 ENCOUNTER — Other Ambulatory Visit (HOSPITAL_COMMUNITY): Payer: Self-pay

## 2022-01-10 LAB — CYTOLOGY - NON PAP

## 2022-01-12 LAB — CSF CULTURE W GRAM STAIN: Culture: NO GROWTH

## 2022-01-13 ENCOUNTER — Other Ambulatory Visit: Payer: Self-pay

## 2022-01-15 ENCOUNTER — Other Ambulatory Visit: Payer: Self-pay

## 2022-01-15 ENCOUNTER — Ambulatory Visit (HOSPITAL_COMMUNITY)
Admission: RE | Admit: 2022-01-15 | Discharge: 2022-01-15 | Disposition: A | Discharge status: Home / Self Care | Payer: BC Managed Care – PPO | Source: Ambulatory Visit | Attending: Internal Medicine | Admitting: Internal Medicine

## 2022-01-15 DIAGNOSIS — K219 Gastro-esophageal reflux disease without esophagitis: Secondary | ICD-10-CM | POA: Diagnosis not present

## 2022-01-15 DIAGNOSIS — I6389 Other cerebral infarction: Secondary | ICD-10-CM

## 2022-01-15 DIAGNOSIS — C719 Malignant neoplasm of brain, unspecified: Secondary | ICD-10-CM | POA: Insufficient documentation

## 2022-01-15 DIAGNOSIS — I6310 Cerebral infarction due to embolism of unspecified precerebral artery: Secondary | ICD-10-CM

## 2022-01-15 DIAGNOSIS — Z8673 Personal history of transient ischemic attack (TIA), and cerebral infarction without residual deficits: Secondary | ICD-10-CM | POA: Diagnosis not present

## 2022-01-15 DIAGNOSIS — E785 Hyperlipidemia, unspecified: Secondary | ICD-10-CM | POA: Insufficient documentation

## 2022-01-15 DIAGNOSIS — I639 Cerebral infarction, unspecified: Secondary | ICD-10-CM | POA: Diagnosis not present

## 2022-01-15 DIAGNOSIS — E119 Type 2 diabetes mellitus without complications: Secondary | ICD-10-CM | POA: Insufficient documentation

## 2022-01-15 LAB — ECHOCARDIOGRAM COMPLETE BUBBLE STUDY
Area-P 1/2: 3.12 cm2
Calc EF: 56.2 %
S' Lateral: 3.4 cm
Single Plane A2C EF: 54.5 %
Single Plane A4C EF: 55.1 %

## 2022-01-15 NOTE — Progress Notes (Signed)
  Echocardiogram 2D Echocardiogram has been performed.  Alan Wise 01/15/2022, 9:56 AM

## 2022-01-16 ENCOUNTER — Ambulatory Visit (HOSPITAL_COMMUNITY)
Admission: RE | Admit: 2022-01-16 | Discharge: 2022-01-16 | Disposition: A | Discharge status: Home / Self Care | Payer: BC Managed Care – PPO | Source: Ambulatory Visit | Attending: Internal Medicine | Admitting: Internal Medicine

## 2022-01-16 DIAGNOSIS — C719 Malignant neoplasm of brain, unspecified: Secondary | ICD-10-CM | POA: Insufficient documentation

## 2022-01-16 MED ORDER — GADOBUTROL 1 MMOL/ML IV SOLN
7.0000 mL | Freq: Once | INTRAVENOUS | Status: AC | PRN
  Administered 2022-01-16: 7 mL via INTRAVENOUS

## 2022-01-20 ENCOUNTER — Inpatient Hospital Stay: Payer: BC Managed Care – PPO

## 2022-01-21 ENCOUNTER — Other Ambulatory Visit: Payer: Self-pay

## 2022-01-23 ENCOUNTER — Encounter (HOSPITAL_COMMUNITY): Payer: Self-pay

## 2022-01-23 ENCOUNTER — Other Ambulatory Visit: Payer: Self-pay

## 2022-01-25 ENCOUNTER — Other Ambulatory Visit: Payer: Self-pay | Admitting: Internal Medicine

## 2022-01-26 ENCOUNTER — Other Ambulatory Visit: Payer: Self-pay | Admitting: Internal Medicine

## 2022-01-27 NOTE — Telephone Encounter (Signed)
Requested Prescriptions  Pending Prescriptions Disp Refills  . JANUVIA 100 MG tablet [Pharmacy Med Name: JANUVIA 100 MG TABLET] 90 tablet 0    Sig: TAKE 1 TABLET BY MOUTH EVERY DAY     Endocrinology:  Diabetes - DPP-4 Inhibitors Failed - 01/26/2022  9:23 AM      Failed - HBA1C is between 0 and 7.9 and within 180 days    Hgb A1c MFr Bld  Date Value Ref Range Status  10/21/2021 8.2 (H) <5.7 % of total Hgb Final    Comment:    For someone without known diabetes, a hemoglobin A1c value of 6.5% or greater indicates that they may have  diabetes and this should be confirmed with a follow-up  test. . For someone with known diabetes, a value <7% indicates  that their diabetes is well controlled and a value  greater than or equal to 7% indicates suboptimal  control. A1c targets should be individualized based on  duration of diabetes, age, comorbid conditions, and  other considerations. . Currently, no consensus exists regarding use of hemoglobin A1c for diagnosis of diabetes for children. .          Passed - Cr in normal range and within 360 days    Creatinine  Date Value Ref Range Status  12/09/2021 0.82 0.61 - 1.24 mg/dL Final   Creat  Date Value Ref Range Status  10/21/2021 0.84 0.60 - 1.29 mg/dL Final   Creatinine,U  Date Value Ref Range Status  04/24/2020 113.1 mg/dL Final   Creatinine, Urine  Date Value Ref Range Status  10/21/2021 140 20 - 320 mg/dL Final         Passed - Valid encounter within last 6 months    Recent Outpatient Visits          3 months ago Encounter for general adult medical examination with abnormal findings   [facility] [place], Coralie Keens, NP   1 year ago Glioma [facility])   [facility], Coralie Keens, NP   1 year ago Seizure-like activity [facility])   [facility], Coralie Keens, NP      Future Appointments            In 2 days Garnette Gunner, Coralie Keens, NP [facility]

## 2022-01-27 NOTE — Telephone Encounter (Signed)
Requested Prescriptions  Pending Prescriptions Disp Refills  . atorvastatin (LIPITOR) 40 MG tablet [Pharmacy Med Name: ATORVASTATIN 40 MG TABLET] 90 tablet 0    Sig: TAKE 1 TABLET BY MOUTH EVERY DAY     Cardiovascular:  Antilipid - Statins Failed - 01/25/2022  9:40 AM      Failed - Lipid Panel in normal range within the last 12 months    Cholesterol  Date Value Ref Range Status  10/21/2021 164 <200 mg/dL Final   LDL Cholesterol (Calc)  Date Value Ref Range Status  10/21/2021 100 (H) mg/dL (calc) Final    Comment:    Reference range: <100 . Desirable range <100 mg/dL for primary prevention;   <70 mg/dL for patients with CHD or diabetic patients  with > or = 2 CHD risk factors. Marland Kitchen LDL-C is now calculated using the Martin-Hopkins  calculation, which is a validated novel method providing  better accuracy than the Friedewald equation in the  estimation of LDL-C.  Cresenciano Genre et al. Annamaria Helling. 3151;761(60): 2061-2068  (http://education.[employer].com/faq/FAQ164)    Direct LDL  Date Value Ref Range Status  08/30/2020 140.0 mg/dL Final    Comment:    Optimal:  <100 mg/dLNear or Above Optimal:  100-129 mg/dLBorderline High:  130-159 mg/dLHigh:  160-189 mg/dLVery High:  >190 mg/dL   HDL  Date Value Ref Range Status  10/21/2021 31 (L) > OR = 40 mg/dL Final   Triglycerides  Date Value Ref Range Status  10/21/2021 212 (H) <150 mg/dL Final    Comment:    . If a non-fasting specimen was collected, consider repeat triglyceride testing on a fasting specimen if clinically indicated.  Yates Decamp et al. J. of Clin. Lipidol. 7371;0:626-948. Marland Kitchen          Passed - Patient is not pregnant      Passed - Valid encounter within last 12 months    Recent Outpatient Visits          3 months ago Encounter for general adult medical examination with abnormal findings   [facility], Coralie Keens, NP   1 year ago Glioma [facility])   [facility], Coralie Keens, NP   1  year ago Seizure-like activity [facility])   [facility], Coralie Keens, NP      Future Appointments            In 2 days Garnette Gunner, Coralie Keens, NP [facility]

## 2022-01-29 ENCOUNTER — Ambulatory Visit: Payer: BC Managed Care – PPO | Admitting: Internal Medicine

## 2022-01-29 ENCOUNTER — Encounter: Payer: Self-pay | Admitting: Internal Medicine

## 2022-01-29 VITALS — BP 130/88 | HR 82 | Temp 96.2°F | Wt 159.0 lb

## 2022-01-29 DIAGNOSIS — E1165 Type 2 diabetes mellitus with hyperglycemia: Secondary | ICD-10-CM

## 2022-01-29 DIAGNOSIS — K219 Gastro-esophageal reflux disease without esophagitis: Secondary | ICD-10-CM | POA: Diagnosis not present

## 2022-01-29 DIAGNOSIS — E782 Mixed hyperlipidemia: Secondary | ICD-10-CM | POA: Diagnosis not present

## 2022-01-29 LAB — POCT GLYCOSYLATED HEMOGLOBIN (HGB A1C): Hemoglobin A1C: 9.1 % — AB (ref 4.0–5.6)

## 2022-01-29 NOTE — Progress Notes (Signed)
Subjective:    Patient ID: Alan Wise, male    DOB: [birth year], 51 y.o.   MRN: [id number]  HPI  Patient presents to clinic today for 29-monthfollow-up of GERD, HLD and DM2.  HLD: His last LDL was 100, triglycerides 212, 10/2021.  He denies myalgias on Atorvastatin.  He tries to consume a low-fat diet.  DM2: His last A1c was 8.2 %, 10/2021.  He is taking Glipizide and Januvia as prescribed.  He does not check his sugars.  He checks his feet routinely.  His last eye exam was 02/2021.  Flu never.  Pneumovax never.  COVID [place] x 1.  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  He does not take any medication OTC for this.  There is no upper GI on file.  Review of Systems  Past Medical History:  Diagnosis Date   Allergy    Complication of anesthesia    pt reports he is starting to have more difficulty arousing after surgery   Diabetes mellitus ([place]    GERD (gastroesophageal reflux disease)    Headache    History of kidney stones    Hyperlipidemia    Renal disorder    kidney stones    Current Outpatient Medications  Medication Sig Dispense Refill   atorvastatin (LIPITOR) 40 MG tablet TAKE 1 TABLET BY MOUTH EVERY DAY 90 tablet 0   docusate sodium (COLACE) 100 MG capsule Take 1 capsule (100 mg total) by mouth 2 (two) times daily. 60 capsule 2   FREESTYLE LITE test strip USE TWO TIMES DAILY TO TEST BLOOD SUGAR 50 each 11   glipiZIDE (GLUCOTROL) 10 MG tablet Take 1 tablet (10 mg total) by mouth 2 (two) times daily. 180 tablet 1   JANUVIA 100 MG tablet TAKE 1 TABLET BY MOUTH EVERY DAY 90 tablet 0   levETIRAcetam (KEPPRA) 500 MG tablet TAKE 1 TABLET BY MOUTH TWICE A DAY 60 tablet 3   ondansetron (ZOFRAN) 8 MG tablet Take 1 tablet (8 mg total) by mouth 2 (two) times daily as needed (nausea and vomiting). May take 30-60 minutes prior to Temodar administration if nausea/vomiting occurs. 30 tablet 1   prednisoLONE acetate (PRED FORTE) 1 % ophthalmic suspension Place 1 drop into both eyes 2 (two)  times daily.     No current facility-administered medications for this visit.    Allergies  Allergen Reactions   Contrast Media [Iodinated Contrast Media] Swelling    Family History  Problem Relation Age of Onset   Hyperlipidemia Father    Hypertension Father    Diabetes Father    Benign prostatic hyperplasia Father    Stroke Maternal Grandmother    Diabetes Maternal Grandmother    Stroke Paternal Grandmother    Hypertension Paternal Grandmother    Kidney disease Neg Hx    Prostate cancer Neg Hx     Social History   Socioeconomic History   Marital status: Married    Spouse name: Not on file   Number of children: Not on file   Years of education: Not on file   Highest education level: Not on file  Occupational History   Not on file  Tobacco Use   Smoking status: Never   Smokeless tobacco: Never  Vaping Use   Vaping Use: Never used  Substance and Sexual Activity   Alcohol use: Not Currently    Comment: rare   Drug use: No   Sexual activity: Not on file  Other Topics Concern   Not on file  Social History Narrative   Not on file   Social Determinants of Health   Financial Resource Strain: Not on file  Food Insecurity: Not on file  Transportation Needs: Not on file  Physical Activity: Not on file  Stress: Not on file  Social Connections: Not on file  Intimate Partner Violence: Not on file     Constitutional: Patient reports fatigue.  Denies fever, malaise, headache or abrupt weight changes.  HEENT: Denies eye pain, eye redness, ear pain, ringing in the ears, wax buildup, runny nose, nasal congestion, bloody nose, or sore throat. Respiratory: Denies difficulty breathing, shortness of breath, cough or sputum production.   Cardiovascular: Denies chest pain, chest tightness, palpitations or swelling in the hands or feet.  Gastrointestinal: Patient reports frequent nausea and vomiting secondary to chemo.  Denies abdominal pain, bloating, constipation, diarrhea  or blood in the stool.  GU: Denies urgency, frequency, pain with urination, burning sensation, blood in urine, odor or discharge. Musculoskeletal: Denies decrease in range of motion, difficulty with gait, muscle pain or joint pain and swelling.  Skin: Denies redness, rashes, lesions or ulcercations.  Neurological: Patient reports restless legs, difficulty with memory.  Denies dizziness, difficulty with speech or problems with balance and coordination.  Psych: Denies anxiety, depression, SI/HI.  No other specific complaints in a complete review of systems (except as listed in HPI above).     Objective:   Physical Exam  BP 130/88 (BP Location: Left Arm, Patient Position: Sitting, Cuff Size: Normal)   Pulse 82   Temp (!) 96.2 F (35.7 C) (Temporal)   Wt 159 lb (72.1 kg)   SpO2 99%   BMI 23.82 kg/m   Wt Readings from Last 3 Encounters:  01/06/22 156 lb 14.4 oz (71.2 kg)  12/09/21 157 lb 11.2 oz (71.5 kg)  10/21/21 157 lb (71.2 kg)    General: Appears his stated age, well developed, well nourished in NAD. Skin: Warm, dry and intact. No ulcerations noted. HEENT: Head: normal shape and size; Eyes: sclera white, no icterus, conjunctiva pink, PERRLA and EOMs intact;  Cardiovascular: Normal rate and rhythm. S1,S2 noted.  No murmur, rubs or gallops noted. No JVD or BLE edema. Pulmonary/Chest: Normal effort and positive vesicular breath sounds. No respiratory distress. No wheezes, rales or ronchi noted.  Musculoskeletal: No difficulty with gait.  Neurological: Alert and oriented.  Psychiatric: Mood and affect flat. Behavior is normal. Judgment and thought content normal.    BMET    Component Value Date/Time   NA 137 12/09/2021 1044   NA 138 05/04/2012 1815   K 4.2 12/09/2021 1044   K 3.5 05/04/2012 1815   CL 103 12/09/2021 1044   CL 99 05/04/2012 1815   CO2 28 12/09/2021 1044   CO2 29 05/04/2012 1815   GLUCOSE 280 (H) 12/09/2021 1044   GLUCOSE 69 05/04/2012 1815   BUN 9  12/09/2021 1044   BUN 12 05/04/2012 1815   CREATININE 0.82 12/09/2021 1044   CREATININE 0.84 10/21/2021 1457   CALCIUM 9.6 12/09/2021 1044   CALCIUM 9.2 05/04/2012 1815   GFRNONAA >60 12/09/2021 1044   GFRNONAA 106 12/05/2020 1014   GFRAA 123 12/05/2020 1014    Lipid Panel     Component Value Date/Time   CHOL 164 10/21/2021 1457   TRIG 212 (H) 10/21/2021 1457   HDL 31 (L) 10/21/2021 1457   CHOLHDL 5.3 (H) 10/21/2021 1457   VLDL 60.6 (H) 08/30/2020 1506   LDLCALC 100 (H) 10/21/2021 1457    CBC  Component Value Date/Time   WBC 6.6 12/09/2021 1044   WBC 6.5 10/21/2021 1457   RBC 5.43 12/09/2021 1044   HGB 16.2 12/09/2021 1044   HGB 16.4 05/04/2012 1815   HCT 45.2 12/09/2021 1044   HCT 48.4 05/04/2012 1815   PLT 236 12/09/2021 1044   PLT 261 05/04/2012 1815   MCV 83.2 12/09/2021 1044   MCV 86 05/04/2012 1815   MCH 29.8 12/09/2021 1044   MCHC 35.8 12/09/2021 1044   RDW 12.4 12/09/2021 1044   RDW 12.7 05/04/2012 1815   LYMPHSABS 0.9 12/09/2021 1044   MONOABS 0.5 12/09/2021 1044   EOSABS 0.3 12/09/2021 1044   BASOSABS 0.0 12/09/2021 1044    Hgb A1C Lab Results  Component Value Date   HGBA1C 8.2 (H) 10/21/2021           Assessment & Plan:     RTC in 3 months, follow-up chronic conditions Webb Silversmith, NP

## 2022-01-29 NOTE — Assessment & Plan Note (Signed)
POCT A1c 9.1% Urine microalbumin checked within the last year Continue glipizide and Januvia He does not want to add additional medication at this time due to his ongoing battle with brain cancer Encourage low-carb diet and exercise for weight loss Encourage routine eye exams Encourage routine foot exams He declines flu and Pneumovax Encouraged him to get his COVID booster

## 2022-01-29 NOTE — Patient Instructions (Signed)

## 2022-01-29 NOTE — Assessment & Plan Note (Signed)
Secondary to chemo Okay to take antinausea medicine or antacids OTC as needed

## 2022-01-29 NOTE — Assessment & Plan Note (Signed)
Continue atorvastatin Encouraged him to consume a low-fat diet

## 2022-01-30 ENCOUNTER — Other Ambulatory Visit: Payer: Self-pay

## 2022-01-30 ENCOUNTER — Other Ambulatory Visit (HOSPITAL_COMMUNITY): Payer: Self-pay

## 2022-01-31 ENCOUNTER — Ambulatory Visit (HOSPITAL_COMMUNITY)
Admission: RE | Admit: 2022-01-31 | Discharge: 2022-01-31 | Disposition: A | Discharge status: Home / Self Care | Payer: BC Managed Care – PPO | Source: Ambulatory Visit | Attending: Internal Medicine | Admitting: Internal Medicine

## 2022-01-31 DIAGNOSIS — C719 Malignant neoplasm of brain, unspecified: Secondary | ICD-10-CM | POA: Diagnosis present

## 2022-01-31 MED ORDER — GADOBUTROL 1 MMOL/ML IV SOLN
7.0000 mL | Freq: Once | INTRAVENOUS | Status: AC | PRN
  Administered 2022-01-31: 7 mL via INTRAVENOUS

## 2022-02-03 ENCOUNTER — Inpatient Hospital Stay: Payer: BC Managed Care – PPO

## 2022-02-03 ENCOUNTER — Other Ambulatory Visit: Payer: Self-pay

## 2022-02-03 ENCOUNTER — Other Ambulatory Visit (HOSPITAL_COMMUNITY): Payer: Self-pay

## 2022-02-03 ENCOUNTER — Inpatient Hospital Stay: Payer: BC Managed Care – PPO | Attending: Internal Medicine | Admitting: Internal Medicine

## 2022-02-03 VITALS — BP 127/87 | HR 84 | Temp 97.5°F | Resp 18 | Ht 68.5 in | Wt 161.5 lb

## 2022-02-03 DIAGNOSIS — Z79899 Other long term (current) drug therapy: Secondary | ICD-10-CM | POA: Diagnosis not present

## 2022-02-03 DIAGNOSIS — C719 Malignant neoplasm of brain, unspecified: Secondary | ICD-10-CM | POA: Diagnosis not present

## 2022-02-03 DIAGNOSIS — C711 Malignant neoplasm of frontal lobe: Secondary | ICD-10-CM | POA: Insufficient documentation

## 2022-02-03 DIAGNOSIS — R569 Unspecified convulsions: Secondary | ICD-10-CM | POA: Diagnosis not present

## 2022-02-03 DIAGNOSIS — E119 Type 2 diabetes mellitus without complications: Secondary | ICD-10-CM | POA: Diagnosis not present

## 2022-02-03 LAB — CBC WITH DIFFERENTIAL (CANCER CENTER ONLY)
Abs Immature Granulocytes: 0.05 10*3/uL (ref 0.00–0.07)
Basophils Absolute: 0.1 10*3/uL (ref 0.0–0.1)
Basophils Relative: 1 %
Eosinophils Absolute: 0.6 10*3/uL — ABNORMAL HIGH (ref 0.0–0.5)
Eosinophils Relative: 9 %
HCT: 43.9 % (ref 39.0–52.0)
Hemoglobin: 15.9 g/dL (ref 13.0–17.0)
Immature Granulocytes: 1 %
Lymphocytes Relative: 15 %
Lymphs Abs: 1.1 10*3/uL (ref 0.7–4.0)
MCH: 29.6 pg (ref 26.0–34.0)
MCHC: 36.2 g/dL — ABNORMAL HIGH (ref 30.0–36.0)
MCV: 81.8 fL (ref 80.0–100.0)
Monocytes Absolute: 0.5 10*3/uL (ref 0.1–1.0)
Monocytes Relative: 7 %
Neutro Abs: 4.7 10*3/uL (ref 1.7–7.7)
Neutrophils Relative %: 67 %
Platelet Count: 205 10*3/uL (ref 150–400)
RBC: 5.37 MIL/uL (ref 4.22–5.81)
RDW: 12.2 % (ref 11.5–15.5)
WBC Count: 7 10*3/uL (ref 4.0–10.5)
nRBC: 0 % (ref 0.0–0.2)

## 2022-02-03 LAB — CMP (CANCER CENTER ONLY)
ALT: 45 U/L — ABNORMAL HIGH (ref 0–44)
AST: 20 U/L (ref 15–41)
Albumin: 4.6 g/dL (ref 3.5–5.0)
Alkaline Phosphatase: 117 U/L (ref 38–126)
Anion gap: 6 (ref 5–15)
BUN: 12 mg/dL (ref 6–20)
CO2: 27 mmol/L (ref 22–32)
Calcium: 9.3 mg/dL (ref 8.9–10.3)
Chloride: 101 mmol/L (ref 98–111)
Creatinine: 0.78 mg/dL (ref 0.61–1.24)
GFR, Estimated: >60 mL/min (ref >60)
Glucose, Bld: 321 mg/dL — ABNORMAL HIGH (ref 70–99)
Potassium: 4.2 mmol/L (ref 3.5–5.1)
Sodium: 134 mmol/L — ABNORMAL LOW (ref 135–145)
Total Bilirubin: 0.5 mg/dL (ref 0.3–1.2)
Total Protein: 7.1 g/dL (ref 6.5–8.1)

## 2022-02-03 MED ORDER — TEMOZOLOMIDE 140 MG PO CAPS
150.0000 mg/m2/d | ORAL_CAPSULE | Freq: Every day | ORAL | 0 refills | Status: AC
  Filled 2022-02-03: qty 10, 28d supply, fill #0

## 2022-02-03 NOTE — Progress Notes (Signed)
[facility] [place],  [address] [phone]  Interval Evaluation  Date of Service: 02/03/22 Patient Name: Alan Wise Patient MRN: [id number] Patient DOB: 1971/02/17 Provider: Ventura Sellers, MD  Identifying Statement:  Alan Wise is a 51 y.o. male with R frontal oligodendroglioma   Oncology History  Oligodendroglioma ([place])  01/17/2021 Surgery   Stereotactic biopsy with Dr. Marcello Moores; path demonstrates IDH-1 mutant oligodendroglioma   03/20/2021 - 03/20/2021 Chemotherapy   Patient is on Treatment Plan : BRAIN Low Grade Glioma Grade II, Anaplastic Glioma Grade III, Radiation Therapy with Concurrent Temozolomide 52m/m2 Daily Followed by Sequential Maintenance Temozolomide     06/02/2021 -  Chemotherapy   Patient is on Treatment Plan : BRAIN ANAPLASTIC GLIOMA GRADE III Temozolomide Post XRT q28d       Biomarkers:  MGMT Unknown.  IDH 1/2 Mutated.  1p/19q Co-deleted  TERT Unknown   Interval History: JKEENE GILKEYpresents today for follow up, now having completed cycle #7 of adjuvant 5-day Temodar and updated brain/spine imaging.  No new or progressive deficits today, ongoing issues with short term memory as prior.  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  No issues with gait or left sided weakness, no further seizures.  H+P (12/31/20) Patient presented to medical attention this past month, with complaint of involuntary left sided shaking.  He describes episodes, occurring several times per week, of left arm shaking, lasting up to 5 minutes; following the episodes he describes the arm as "heavy" for some time before returning to normal.  He also describes episodes of sudden inability to speak, with strange movements of face and hands, lasting same amount of time.  These episodes go back ~10 years in very sporadic nature, but have become much more frequent in recent  months.  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  These episodes have occurred at work, and have been disruptive in that environment.  He is otherwise fully functional, independent.  Medications: Current Outpatient Medications on File Prior to Visit  Medication Sig Dispense Refill   atorvastatin (LIPITOR) 40 MG tablet TAKE 1 TABLET BY MOUTH EVERY DAY 90 tablet 0   docusate sodium (COLACE) 100 MG capsule Take 1 capsule (100 mg total) by mouth 2 (two) times daily. 60 capsule 2   FREESTYLE LITE test strip USE TWO TIMES DAILY TO TEST BLOOD SUGAR 50 each 11   glipiZIDE (GLUCOTROL) 10 MG tablet Take 1 tablet (10 mg total) by mouth 2 (two) times daily. 180 tablet 1   JANUVIA 100 MG tablet TAKE 1 TABLET BY MOUTH EVERY DAY 90 tablet 0   levETIRAcetam (KEPPRA) 500 MG tablet TAKE 1 TABLET BY MOUTH TWICE A DAY 60 tablet 3   ondansetron (ZOFRAN) 8 MG tablet Take 1 tablet (8 mg total) by mouth 2 (two) times daily as needed (nausea and vomiting). May take 30-60 minutes prior to Temodar administration if nausea/vomiting occurs. 30 tablet 1   prednisoLONE acetate (PRED FORTE) 1 % ophthalmic suspension Place 1 drop into both eyes 2 (two) times daily.     No current facility-administered medications on file prior to visit.    Allergies:  Allergies  Allergen Reactions   Contrast Media [Iodinated Contrast Media] Swelling   Past Medical History:  Past Medical History:  Diagnosis Date   Allergy    Complication of anesthesia    pt reports he  is starting to have more difficulty arousing after surgery   Diabetes mellitus ([place])    GERD (gastroesophageal reflux disease)    Headache    History of kidney stones    Hyperlipidemia    Renal disorder    kidney stones   Past Surgical History:  Past Surgical History:  Procedure Laterality Date   APPLICATION OF CRANIAL NAVIGATION Right 01/17/2021   Procedure: APPLICATION OF CRANIAL NAVIGATION;  Surgeon: Vallarie Mare, MD;  Location: [facility];   Service: Neurosurgery;  Laterality: Right;   COLONOSCOPY  09/03/1994   CYSTOSCOPY WITH STENT PLACEMENT Right 01/25/2016   Procedure: CYSTOSCOPY WITH STENT PLACEMENT;  Surgeon: Nickie Retort, MD;  Location: [facility];  Service: Urology;  Laterality: Right;   FRAMELESS  BIOPSY WITH BRAINLAB Right 01/17/2021   Procedure: RIGHT STEREOTACTIC BIOPSY OF INSULAR LESION;  Surgeon: Vallarie Mare, MD;  Location: [facility];  Service: Neurosurgery;  Laterality: Right;   KNEE ARTHROSCOPY W/ MENISCAL REPAIR Right 06/23/1988   removal of birthmark  06/08/2013   SKIN CANCER EXCISION  06/23/2012   TYMPANOSTOMY TUBE PLACEMENT  08/06/1978   URETEROSCOPY WITH HOLMIUM LASER LITHOTRIPSY Right 01/25/2016   Procedure: URETEROSCOPY WITH HOLMIUM LASER LITHOTRIPSY;  Surgeon: Nickie Retort, MD;  Location: [facility];  Service: Urology;  Laterality: Right;   URETHRAL STRICTURE DILATATION     visual inspection of vocal cord     1973   WISDOM TOOTH EXTRACTION     Social History:  Social History   Socioeconomic History   Marital status: Married    Spouse name: Not on file   Number of children: Not on file   Years of education: Not on file   Highest education level: Not on file  Occupational History   Not on file  Tobacco Use   Smoking status: Never   Smokeless tobacco: Never  Vaping Use   Vaping Use: Never used  Substance and Sexual Activity   Alcohol use: Not Currently    Comment: rare   Drug use: No   Sexual activity: Not on file  Other Topics Concern   Not on file  Social History Narrative   Not on file   Social Determinants of Health   Financial Resource Strain: Not on file  Food Insecurity: Not on file  Transportation Needs: Not on file  Physical Activity: Not on file  Stress: Not on file  Social Connections: Not on file  Intimate Partner Violence: Not on file   Family History:  Family History  Problem Relation Age of Onset   Hyperlipidemia Father    Hypertension Father     Diabetes Father    Benign prostatic hyperplasia Father    Stroke Maternal Grandmother    Diabetes Maternal Grandmother    Stroke Paternal Grandmother    Hypertension Paternal Grandmother    Kidney disease Neg Hx    Prostate cancer Neg Hx     Review of Systems: Constitutional: Doesn't report fevers, chills or abnormal weight loss Eyes: Doesn't report blurriness of vision Ears, nose, mouth, throat, and face: Doesn't report sore throat Respiratory: Doesn't report cough, dyspnea or wheezes Cardiovascular: Doesn't report palpitation, chest discomfort  Gastrointestinal:  Doesn't report nausea, constipation, diarrhea GU: Doesn't report incontinence Skin: Doesn't report skin rashes Neurological: Per HPI Musculoskeletal: Doesn't report joint pain Behavioral/Psych: Doesn't report anxiety  Physical Exam: Vitals:   02/03/22 1044  BP: 127/87  Pulse: 84  Resp: 18  Temp: (!) 97.5 F (36.4 C)   KPS: 90. General:  Fatigued Head: Normal EENT: No conjunctival injection or scleral icterus.  Lungs: Resp effort normal Cardiac: Regular rate Abdomen: Non-distended abdomen Skin: No rashes cyanosis or petechiae. Extremities: No clubbing or edema  Neurologic Exam: Mental Status: Awake, alert, attentive to examiner. Oriented to self and environment. Language is fluent with intact comprehension.  Modest psychomotor slowing, impaired 3 object recall Cranial Nerves: Visual acuity is grossly normal. Visual fields are full. Extra-ocular movements intact. No ptosis. Face is symmetric Motor: Tone and bulk are normal. Power is full in both arms and legs. Reflexes are symmetric, no pathologic reflexes present.  Sensory: Intact to light touch Gait: Normal.   Labs: I have reviewed the data as listed    Component Value Date/Time   NA 137 12/09/2021 1044   NA 138 05/04/2012 1815   K 4.2 12/09/2021 1044   K 3.5 05/04/2012 1815   CL 103 12/09/2021 1044   CL 99 05/04/2012 1815   CO2 28 12/09/2021 1044    CO2 29 05/04/2012 1815   GLUCOSE 280 (H) 12/09/2021 1044   GLUCOSE 69 05/04/2012 1815   BUN 9 12/09/2021 1044   BUN 12 05/04/2012 1815   CREATININE 0.82 12/09/2021 1044   CREATININE 0.84 10/21/2021 1457   CALCIUM 9.6 12/09/2021 1044   CALCIUM 9.2 05/04/2012 1815   PROT 7.2 12/09/2021 1044   PROT 8.5 (H) 05/04/2012 1815   ALBUMIN 4.5 12/09/2021 1044   ALBUMIN 4.5 05/04/2012 1815   AST 17 12/09/2021 1044   ALT 32 12/09/2021 1044   ALT 77 05/04/2012 1815   ALKPHOS 123 12/09/2021 1044   ALKPHOS 129 05/04/2012 1815   BILITOT 0.5 12/09/2021 1044   GFRNONAA >60 12/09/2021 1044   GFRNONAA 106 12/05/2020 1014   GFRAA 123 12/05/2020 1014   Lab Results  Component Value Date   WBC 6.6 12/09/2021   NEUTROABS 4.9 12/09/2021   HGB 16.2 12/09/2021   HCT 45.2 12/09/2021   MCV 83.2 12/09/2021   PLT 236 12/09/2021   Imaging:  [facility] Clinician Interpretation: I have personally reviewed the CNS images as listed.  My interpretation, in the context of the patient's clinical presentation, is stable disease  MR BRAIN W WO CONTRAST  Result Date: 02/01/2022 CLINICAL DATA:  51 year old male with oligodendroglioma. Suspicious new right frontal and bilateral temporal lobe enhancement last month. But subsequently no evidence of spinal metastatic disease. Restaging. EXAM: MRI HEAD WITHOUT AND WITH CONTRAST TECHNIQUE: Multiplanar, multiecho pulse sequences of the brain and surrounding structures were obtained without and with intravenous contrast. CONTRAST:  29m GADAVIST GADOBUTROL 1 MMOL/ML IV SOLN COMPARISON:  Brain MRI 01/02/2022 and earlier. FINDINGS: Brain: Chronic anterior right insula T2 and FLAIR hyperintensity, thickened right insula (series 11, image 27) surrounding a small 11 mm cystic biopsy cavity (image 30). Stable mild regional hemosiderin. This primary lesion site appears stable since 05/25/2021. However, ongoing new since last year right temporal lobe periventricular T2 and FLAIR  hyperintensity from the temporal horn toward the atria (series 11, image 22). With multiple underlying nodular periventricular foci of enhancement (series 17 images 11 through 17). The most discrete enhancing nodules range from punctate (series 16, image 53) up to 7-8 mm, although there is a larger 13 mm area of more indistinct periventricular enhancement seen on series 16, image 65. And a similar smaller area of abnormal enhancement at the right frontal horn is redemonstrated (series 16, image 94). These have not significantly changed from last month. Nearby right cingulate gyrus nodular enhancement on series 16, image 100  was present but is more conspicuous now. Other abnormal enhancing foci have not significantly changed. And a single contralateral left inferior frontal gyrus nodule of enhancement on image 82 series 16 is stable. No other ependymal enhancement. No ventriculomegaly or other transependymal edema. No intraventricular debris. No background dural thickening. No superimposed restricted diffusion suggestive of acute infarction. No midline shift, extra-axial collection or acute intracranial hemorrhage. Cervicomedullary junction and pituitary are within normal limits. Vascular: Major intracranial vascular flow voids are stable. The major dural venous sinuses are enhancing and appear to be patent. Skull and upper cervical spine: Visible cervical spine and spinal cord remain within normal limits. Visualized bone marrow signal is within normal limits. Sinuses/Orbits: Stable, negative. Other: Visible internal auditory structures appear normal. Negative visible scalp and face. IMPRESSION: Ongoing multifocal parenchymal and periventricular nodularity in the anterior frontal and right temporal lobes now strongly suggestive of metastatic disease. Associated right temporal horn and atrium periventricular edema or nonenhancing tumor. No significant progression from last month. Electronically Signed   By: Genevie Ann  M.D.   On: 02/01/2022 08:00   MR TOTAL SPINE METS SCREENING  Result Date: 01/16/2022 CLINICAL DATA:  Oligodendroglioma, evaluation for possible spinal Mets EXAM: MRI TOTAL SPINE WITHOUT AND WITH CONTRAST TECHNIQUE: Multisequence MR imaging of the spine from the cervical spine to the sacrum was performed prior to and following IV contrast administration for evaluation of spinal metastatic disease. CONTRAST:  57m GADAVIST GADOBUTROL 1 MMOL/ML IV SOLN COMPARISON:  None Available. FINDINGS: MRI CERVICAL SPINE FINDINGS Alignment: Physiologic. Vertebrae: No acute fracture or suspicious osseous lesion. No abnormal enhancement. Cord: Normal in signal and morphology.  No abnormal enhancement. Posterior Fossa, vertebral arteries, paraspinal tissues: Negative. Disc levels: Mild degenerative changes, with a small central disc bulge at C4-C5, that causes moderate spinal canal stenosis. Evaluation of the neural foramina is limited by the absence of axial sequences. MRI THORACIC SPINE FINDINGS Alignment: Mild S shaped curvature of the thoracolumbar spine. No listhesis. Vertebrae: No acute fracture or suspicious osseous lesion. No abnormal enhancement. T1 and T2 hyperintense foci in the vertebral bodies, which are consistent with benign hemangiomas. Cord:  Normal in signal.  No abnormal enhancement. Paraspinal and other soft tissues: Negative. Disc levels: Small disc protrusions at T3-T4, T4-T5, T5-T6, and T9-T10, without spinal canal stenosis or neural foraminal narrowing. MRI LUMBAR SPINE FINDINGS Segmentation:  5 lumbar-type vertebral bodies. Alignment: Mild S shaped curvature of the thoracolumbar spine. No listhesis. Vertebrae: No acute fracture or suspicious osseous lesion. No abnormal osseous enhancement. T1 and T2 hyperintense foci, consistent with benign hemangiomas. Conus medullaris: Extends to the L1 level and appears normal. Paraspinal and other soft tissues: Negative. Disc levels: No significant disc desiccation or  disc bulge. No significant spinal canal stenosis or neural foraminal narrowing. IMPRESSION: 1. No abnormal enhancement to suggest metastatic disease in the cervical, thoracic, or lumbar spine. 2. C4-C5 moderate spinal canal stenosis. Electronically Signed   By: AMerilyn BabaM.D.   On: 01/16/2022 15:03   ECHOCARDIOGRAM COMPLETE BUBBLE STUDY  Result Date: 01/15/2022    ECHOCARDIOGRAM REPORT   Patient Name:   JJAMEER STORIEDate of Exam: 01/15/2022 Medical Rec #:  [id number]        Height:       68.5 in Accession #:    [id number]       Weight:       156.9 lb Date of Birth:  [birth year]       BSA:  1.853 m Patient Age:    56 years          BP:           145/90 mmHg Patient Gender: M                 HR:           72 bpm. Exam Location:  Outpatient Procedure: 2D Echo, Color Doppler, Cardiac Doppler, Saline Contrast Bubble Study            and 3D Echo Indications:    Stroke 434.91 / I63.9  History:        Patient has no prior history of Echocardiogram examinations.                 Risk Factors:Diabetes, Dyslipidemia and GERD.  Sonographer:    Bernadene Person RDCS Referring Phys: [id number] [place]  1. Left ventricular ejection fraction, by estimation, is 55 to 60%. Left ventricular ejection fraction by 3D volume is 55 %. The left ventricle has normal function. The left ventricle has no regional wall motion abnormalities. Left ventricular diastolic  parameters were normal.  2. Right ventricular systolic function is normal. The right ventricular size is normal.  3. The mitral valve is normal in structure. Trivial mitral valve regurgitation. No evidence of mitral stenosis.  4. The aortic valve is tricuspid. Aortic valve regurgitation is not visualized. Aortic valve sclerosis is present, with no evidence of aortic valve stenosis.  5. The inferior vena cava is normal in size with greater than 50% respiratory variability, suggesting right atrial pressure of 3 mmHg.  6. Agitated saline contrast  bubble study was negative, with no evidence of any interatrial shunt. FINDINGS  Left Ventricle: Left ventricular ejection fraction, by estimation, is 55 to 60%. Left ventricular ejection fraction by 3D volume is 55 %. The left ventricle has normal function. The left ventricle has no regional wall motion abnormalities. The left ventricular internal cavity size was normal in size. There is no left ventricular hypertrophy. Left ventricular diastolic parameters were normal. Right Ventricle: The right ventricular size is normal. No increase in right ventricular wall thickness. Right ventricular systolic function is normal. Left Atrium: Left atrial size was normal in size. Right Atrium: Right atrial size was normal in size. Pericardium: There is no evidence of pericardial effusion. Mitral Valve: The mitral valve is normal in structure. Trivial mitral valve regurgitation. No evidence of mitral valve stenosis. Tricuspid Valve: The tricuspid valve is normal in structure. Tricuspid valve regurgitation is not demonstrated. No evidence of tricuspid stenosis. Aortic Valve: The aortic valve is tricuspid. Aortic valve regurgitation is not visualized. Aortic valve sclerosis is present, with no evidence of aortic valve stenosis. Pulmonic Valve: The pulmonic valve was normal in structure. Pulmonic valve regurgitation is not visualized. No evidence of pulmonic stenosis. Aorta: The aortic root and ascending aorta are structurally normal, with no evidence of dilitation. Venous: The inferior vena cava is normal in size with greater than 50% respiratory variability, suggesting right atrial pressure of 3 mmHg. IAS/Shunts: No atrial level shunt detected by color flow Doppler. Agitated saline contrast was given intravenously to evaluate for intracardiac shunting. Agitated saline contrast bubble study was negative, with no evidence of any interatrial shunt.  LEFT VENTRICLE PLAX 2D LVIDd:         4.70 cm         Diastology LVIDs:         3.40  cm  LV e' medial:    8.35 cm/s LV PW:         1.00 cm         LV E/e' medial:  7.7 LV IVS:        0.80 cm         LV e' lateral:   9.89 cm/s LVOT diam:     2.00 cm         LV E/e' lateral: 6.5 LV SV:         57 LV SV Index:   31 LVOT Area:     3.14 cm        3D Volume EF                                LV 3D EF:    Left                                             ventricul LV Volumes (MOD)                            ar LV vol d, MOD    78.6 ml                    ejection A2C:                                        fraction LV vol d, MOD    89.8 ml                    by 3D A4C:                                        volume is LV vol s, MOD    35.8 ml                    55 %. A2C: LV vol s, MOD    40.3 ml A4C:                           3D Volume EF: LV SV MOD A2C:   42.8 ml       3D EF:        55 % LV SV MOD A4C:   89.8 ml       LV EDV:       116 ml LV SV MOD BP:    49.0 ml       LV ESV:       52 ml                                LV SV:        64 ml RIGHT VENTRICLE RV S prime:     12.40 cm/s TAPSE (M-mode): 2.0 cm LEFT ATRIUM           Index        RIGHT ATRIUM  Index LA diam:      3.00 cm 1.62 cm/m   RA Area:     11.30 cm LA Vol (A2C): 30.5 ml 16.46 ml/m  RA Volume:   23.10 ml  12.46 ml/m LA Vol (A4C): 21.6 ml 11.65 ml/m  AORTIC VALVE LVOT Vmax:   88.30 cm/s LVOT Vmean:  57.600 cm/s LVOT VTI:    0.182 m  AORTA Ao Root diam: 3.00 cm Ao Asc diam:  3.20 cm MITRAL VALVE MV Area (PHT): 3.12 cm    SHUNTS MV Decel Time: 243 msec    Systemic VTI:  0.18 m MV E velocity: 64.60 cm/s  Systemic Diam: 2.00 cm MV A velocity: 54.80 cm/s MV E/A ratio:  1.18 Mihai Croitoru MD Electronically signed by Sanda Klein MD Signature Date/Time: 01/15/2022/1:09:34 PM    Final    DG FL GUIDED LUMBAR PUNCTURE  Result Date: 01/09/2022 CLINICAL DATA:  Provided history: Oligodendroglioma. 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 EXAM: LUMBAR PUNCTURE UNDER FLUOROSCOPY PROCEDURE: Clovia Cuff, PA-C obtained informed consent from  the patient prior to the procedure. This process included a discussion of procedural risks. The patient was positioned prone on the fluoroscopy table. An appropriate skin entry site was determined under fluoroscopy and marked. A time-out was performed. The operator donned sterile gloves and a mass. The skin entry site was prepped with Betadine, draped in the usual sterile fashion and infiltrated locally with 1% lidocaine. A 20 gauge spinal needle was advanced into the thecal sac at the L4-L5 level with spontaneous return of clear CSF and an opening pressure of 15 cm water. 11 mL of CSF were collected for laboratory studies. The inner stylet was replaced within the needle and the needle was removed in its entirety. The patient tolerated the procedure well, and no immediate post-procedure complication was apparent. The procedure was performed by Brynda Greathouse PA-C, and was supervised and interpreted by Kellie Simmering, D.O. FLUOROSCOPY: Fluoroscopy time: 6 seconds (0.90 mGy). IMPRESSION: Technically successful fluoroscopically-guided L4-L5 lumbar puncture. Opening pressure: 15 cm water. 11 mL of CSF obtained for laboratory studies. No immediate post-procedure complication. Electronically Signed   By: Kellie Simmering D.O.   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  MRI brain demonstrates overall stability of newer small foci of enhancement within the right hemisphere, including 2 adjacent to the lateral ventricle.  Right frontal clustered lesions are more prominent, however. Etiology remains unclear; parenchymal progression of disease, leptomeningeal dissemination and progression, unusual radio-inflammatory change, metastases from separate neoplasm (less likely).  The following workup was completed given suspicion for LMD and/or enhancing infarcts: -MRI total spine mets  screening study (normal) -LP for routine studies and cytology (no malignant cells, elevated protein) -TTE given some suspicion for cardioembolic process per discussion with radiology (normal) -Send tissue to [facility] for sequencing data (normal mutational burden)  We recommended continuing treatment with cycle #8 Temozolomide, still at 146m/m2, on for five days and off for twenty three days in twenty eight day cycles. The patient will have a complete blood count performed on days 21 and 28 of each cycle, and a comprehensive metabolic panel performed on day 28 of each cycle. Labs may need to be performed more often. Zofran will prescribed for home use for nausea/vomiting.   Chemotherapy should be held for the following:  ANC less than 1,000  Platelets less than 100,000  LFT or creatinine greater than  2x ULN  If clinical concerns/contraindications develop  Will con't Keppra 561m BID, no steroids.  Ok with Trazodone 53mHS for sleep issues.  We ask that JoHUCK ASHWORTHeturn to clinic in 1 months with MRI brain for evaluation, as we need to keep a very close watch on these new enhancing lesions.   All questions were answered. The patient knows to call the clinic with any problems, questions or concerns. No barriers to learning were detected.  The total time spent in the encounter was 40 minutes and more than 50% was on counseling and review of test results   ZaVentura SellersMD Medical Director of Neuro-Oncology [facility]8/14/23 10:37 AM

## 2022-02-10 ENCOUNTER — Telehealth: Payer: Self-pay | Admitting: *Deleted

## 2022-02-10 NOTE — Telephone Encounter (Signed)
Appeal letter prepared for [facility].

## 2022-02-17 ENCOUNTER — Other Ambulatory Visit (HOSPITAL_COMMUNITY): Payer: Self-pay

## 2022-02-17 ENCOUNTER — Telehealth: Payer: Self-pay | Admitting: *Deleted

## 2022-02-17 ENCOUNTER — Telehealth: Payer: Self-pay | Admitting: Pharmacist

## 2022-02-17 NOTE — Telephone Encounter (Signed)
Oral Oncology Pharmacist Encounter   Received notification from [facility] that prior authorization for Temozolomide is required.   PA submitted verbally to [facility] at [id number] Key: [id number] Status is pending   [facility] will continue to follow.   Leron Croak, PharmD, BCPS, BCOP Hematology/Oncology Clinical Pharmacist [facility] [place] [phone] 02/17/2022 9:07 AM

## 2022-02-17 NOTE — Telephone Encounter (Signed)
Wife called to run a question by Dr Mickeal Skinner.  She said that all of the most recent tests have not resulted in anything meaningful and was curious if patient could have something else on top of his diagnosis like multiple sclerosis.    Routed to provider.

## 2022-02-17 NOTE — Telephone Encounter (Signed)
Oral Oncology Pharmacist Encounter  Prior Authorization renewal for Temozolomide has been approved.    PA# [id number] Effective dates: 02/17/22 through 02/18/23  Leron Croak, PharmD, BCPS, BCOP Hematology/Oncology Clinical Pharmacist [facility] [place] [phone] 02/17/2022 9:24 AM

## 2022-02-18 NOTE — Telephone Encounter (Signed)
Communicated response to patients wife.  She was thankful for the response.

## 2022-02-25 ENCOUNTER — Other Ambulatory Visit (HOSPITAL_COMMUNITY): Payer: Self-pay

## 2022-03-04 ENCOUNTER — Other Ambulatory Visit: Payer: Self-pay | Admitting: Radiation Therapy

## 2022-03-08 ENCOUNTER — Ambulatory Visit (HOSPITAL_COMMUNITY)
Admission: RE | Admit: 2022-03-08 | Discharge: 2022-03-08 | Disposition: A | Discharge status: Home / Self Care | Payer: BC Managed Care – PPO | Source: Ambulatory Visit | Attending: Internal Medicine | Admitting: Internal Medicine

## 2022-03-08 DIAGNOSIS — C719 Malignant neoplasm of brain, unspecified: Secondary | ICD-10-CM | POA: Diagnosis present

## 2022-03-08 MED ORDER — GADOBUTROL 1 MMOL/ML IV SOLN
7.0000 mL | Freq: Once | INTRAVENOUS | Status: AC | PRN
  Administered 2022-03-08: 7 mL via INTRAVENOUS

## 2022-03-10 ENCOUNTER — Other Ambulatory Visit (HOSPITAL_COMMUNITY): Payer: Self-pay

## 2022-03-10 ENCOUNTER — Inpatient Hospital Stay: Payer: BC Managed Care – PPO | Attending: Internal Medicine

## 2022-03-10 DIAGNOSIS — E119 Type 2 diabetes mellitus without complications: Secondary | ICD-10-CM | POA: Insufficient documentation

## 2022-03-10 DIAGNOSIS — R569 Unspecified convulsions: Secondary | ICD-10-CM | POA: Insufficient documentation

## 2022-03-10 DIAGNOSIS — C711 Malignant neoplasm of frontal lobe: Secondary | ICD-10-CM | POA: Insufficient documentation

## 2022-03-11 ENCOUNTER — Inpatient Hospital Stay (HOSPITAL_BASED_OUTPATIENT_CLINIC_OR_DEPARTMENT_OTHER): Payer: BC Managed Care – PPO | Admitting: Internal Medicine

## 2022-03-11 ENCOUNTER — Inpatient Hospital Stay: Payer: BC Managed Care – PPO

## 2022-03-11 ENCOUNTER — Other Ambulatory Visit: Payer: Self-pay

## 2022-03-11 VITALS — BP 127/86 | HR 85 | Temp 98.4°F | Resp 15 | Wt 158.2 lb

## 2022-03-11 DIAGNOSIS — R569 Unspecified convulsions: Secondary | ICD-10-CM

## 2022-03-11 DIAGNOSIS — C719 Malignant neoplasm of brain, unspecified: Secondary | ICD-10-CM

## 2022-03-11 DIAGNOSIS — E119 Type 2 diabetes mellitus without complications: Secondary | ICD-10-CM | POA: Diagnosis not present

## 2022-03-11 DIAGNOSIS — C711 Malignant neoplasm of frontal lobe: Secondary | ICD-10-CM | POA: Diagnosis present

## 2022-03-11 LAB — CBC WITH DIFFERENTIAL (CANCER CENTER ONLY)
Abs Immature Granulocytes: 0.04 10*3/uL (ref 0.00–0.07)
Basophils Absolute: 0.1 10*3/uL (ref 0.0–0.1)
Basophils Relative: 1 %
Eosinophils Absolute: 0.6 10*3/uL — ABNORMAL HIGH (ref 0.0–0.5)
Eosinophils Relative: 7 %
HCT: 44.7 % (ref 39.0–52.0)
Hemoglobin: 16 g/dL (ref 13.0–17.0)
Immature Granulocytes: 1 %
Lymphocytes Relative: 13 %
Lymphs Abs: 1 10*3/uL (ref 0.7–4.0)
MCH: 29.7 pg (ref 26.0–34.0)
MCHC: 35.8 g/dL (ref 30.0–36.0)
MCV: 83.1 fL (ref 80.0–100.0)
Monocytes Absolute: 0.5 10*3/uL (ref 0.1–1.0)
Monocytes Relative: 6 %
Neutro Abs: 5.8 10*3/uL (ref 1.7–7.7)
Neutrophils Relative %: 72 %
Platelet Count: 235 10*3/uL (ref 150–400)
RBC: 5.38 MIL/uL (ref 4.22–5.81)
RDW: 12.2 % (ref 11.5–15.5)
WBC Count: 8 10*3/uL (ref 4.0–10.5)
nRBC: 0 % (ref 0.0–0.2)

## 2022-03-11 LAB — CMP (CANCER CENTER ONLY)
ALT: 38 U/L (ref 0–44)
AST: 20 U/L (ref 15–41)
Albumin: 4.5 g/dL (ref 3.5–5.0)
Alkaline Phosphatase: 107 U/L (ref 38–126)
Anion gap: 3 — ABNORMAL LOW (ref 5–15)
BUN: 14 mg/dL (ref 6–20)
CO2: 33 mmol/L — ABNORMAL HIGH (ref 22–32)
Calcium: 9.4 mg/dL (ref 8.9–10.3)
Chloride: 100 mmol/L (ref 98–111)
Creatinine: 0.89 mg/dL (ref 0.61–1.24)
GFR, Estimated: >60 mL/min (ref >60)
Glucose, Bld: 334 mg/dL — ABNORMAL HIGH (ref 70–99)
Potassium: 4.2 mmol/L (ref 3.5–5.1)
Sodium: 136 mmol/L (ref 135–145)
Total Bilirubin: 0.7 mg/dL (ref 0.3–1.2)
Total Protein: 7.2 g/dL (ref 6.5–8.1)

## 2022-03-11 NOTE — Progress Notes (Signed)
[facility] [place],  [id number] [phone]  Interval Evaluation  Date of Service: 03/11/22 Patient Name: Alan Wise Patient MRN: [id number] Patient DOB: 1970-10-10 Provider: Ventura Sellers, MD  Identifying Statement:  Alan Wise is a 51 y.o. male with R frontal oligodendroglioma   Oncology History  Oligodendroglioma ([place])  01/17/2021 Surgery   Stereotactic biopsy with Dr. Marcello Moores; path demonstrates IDH-1 mutant oligodendroglioma   03/20/2021 - 04/30/2021 Radiation Therapy   Completes 54 Gy IMRT and Temodar with Dr. Isidore Moos   06/02/2021 - 03/05/2022 Chemotherapy   Completes 8 cycles of adjuvant 5-day Temozolomide   02/01/2022 Progression   Progression of disease in multifocal/metastatic/lmd pattern   03/11/2022 Progression   Progression of disease confirmed after additional cycle of TMZ, 1 month follow up MRI.  LP/cytology and MRI spine mets screening unremarkable     Biomarkers:   Interval History: Alan Wise presents today for follow up, now having completed cycle #8 of adjuvant 5-day Temodar and updated brain/spine imaging.  No significant new or progressive deficits today, he has had word finding difficulty here and there, as well as ongoing issues with short term memory.  He is working partly from home and is involved in more childcare lately.  No issues with gait or left sided weakness, no further seizures.  H+P (12/31/20) Patient presented to medical attention this past month, with complaint of involuntary left sided shaking.  He describes episodes, occurring several times per week, of left arm shaking, lasting up to 5 minutes; following the episodes he describes the arm as "heavy" for some time before returning to normal.  He also describes episodes of sudden inability to speak, with strange movements of face and hands, lasting same amount of time.  These episodes go back ~10 years in very  sporadic nature, but have become much more frequent in recent months.  He works full time as a Health and safety inspector at [employer].  These episodes have occurred at work, and have been disruptive in that environment.  He is otherwise fully functional, independent.  Medications: Current Outpatient Medications on File Prior to Visit  Medication Sig Dispense Refill   atorvastatin (LIPITOR) 40 MG tablet TAKE 1 TABLET BY MOUTH EVERY DAY 90 tablet 0   docusate sodium (COLACE) 100 MG capsule Take 1 capsule (100 mg total) by mouth 2 (two) times daily. 60 capsule 2   FREESTYLE LITE test strip USE TWO TIMES DAILY TO TEST BLOOD SUGAR 50 each 11   glipiZIDE (GLUCOTROL) 10 MG tablet Take 1 tablet (10 mg total) by mouth 2 (two) times daily. 180 tablet 1   JANUVIA 100 MG tablet TAKE 1 TABLET BY MOUTH EVERY DAY 90 tablet 0   levETIRAcetam (KEPPRA) 500 MG tablet TAKE 1 TABLET BY MOUTH TWICE A DAY 60 tablet 3   ondansetron (ZOFRAN) 8 MG tablet Take 1 tablet (8 mg total) by mouth 2 (two) times daily as needed (nausea and vomiting). May take 30-60 minutes prior to Temodar administration if nausea/vomiting occurs. 30 tablet 1   prednisoLONE acetate (PRED FORTE) 1 % ophthalmic suspension Place 1 drop into both eyes 2 (two) times daily.     No current facility-administered medications on file prior to visit.    Allergies:  Allergies  Allergen Reactions   Contrast Media [Iodinated Contrast Media] Swelling   Past Medical History:  Past Medical History:  Diagnosis Date   Allergy    Complication of anesthesia  pt reports he is starting to have more difficulty arousing after surgery   Diabetes mellitus ([place])    GERD (gastroesophageal reflux disease)    Headache    History of kidney stones    Hyperlipidemia    Renal disorder    kidney stones   Past Surgical History:  Past Surgical History:  Procedure Laterality Date   APPLICATION OF CRANIAL NAVIGATION Right 01/17/2021   Procedure: APPLICATION OF CRANIAL  NAVIGATION;  Surgeon: Vallarie Mare, MD;  Location: [facility];  Service: Neurosurgery;  Laterality: Right;   COLONOSCOPY  09/03/1994   CYSTOSCOPY WITH STENT PLACEMENT Right 01/25/2016   Procedure: CYSTOSCOPY WITH STENT PLACEMENT;  Surgeon: Nickie Retort, MD;  Location: [facility];  Service: Urology;  Laterality: Right;   FRAMELESS  BIOPSY WITH BRAINLAB Right 01/17/2021   Procedure: RIGHT STEREOTACTIC BIOPSY OF INSULAR LESION;  Surgeon: Vallarie Mare, MD;  Location: [facility];  Service: Neurosurgery;  Laterality: Right;   KNEE ARTHROSCOPY W/ MENISCAL REPAIR Right 06/23/1988   removal of birthmark  06/08/2013   SKIN CANCER EXCISION  06/23/2012   TYMPANOSTOMY TUBE PLACEMENT  08/06/1978   URETEROSCOPY WITH HOLMIUM LASER LITHOTRIPSY Right 01/25/2016   Procedure: URETEROSCOPY WITH HOLMIUM LASER LITHOTRIPSY;  Surgeon: Nickie Retort, MD;  Location: [facility];  Service: Urology;  Laterality: Right;   URETHRAL STRICTURE DILATATION     visual inspection of vocal cord     1973   WISDOM TOOTH EXTRACTION     Social History:  Social History   Socioeconomic History   Marital status: Married    Spouse name: Not on file   Number of children: Not on file   Years of education: Not on file   Highest education level: Not on file  Occupational History   Not on file  Tobacco Use   Smoking status: Never   Smokeless tobacco: Never  Vaping Use   Vaping Use: Never used  Substance and Sexual Activity   Alcohol use: Not Currently    Comment: rare   Drug use: No   Sexual activity: Not on file  Other Topics Concern   Not on file  Social History Narrative   Not on file   Social Determinants of Health   Financial Resource Strain: Not on file  Food Insecurity: Not on file  Transportation Needs: Not on file  Physical Activity: Not on file  Stress: Not on file  Social Connections: Not on file  Intimate Partner Violence: Not on file   Family History:  Family History  Problem Relation Age of  Onset   Hyperlipidemia Father    Hypertension Father    Diabetes Father    Benign prostatic hyperplasia Father    Stroke Maternal Grandmother    Diabetes Maternal Grandmother    Stroke Paternal Grandmother    Hypertension Paternal Grandmother    Kidney disease Neg Hx    Prostate cancer Neg Hx     Review of Systems: Constitutional: Doesn't report fevers, chills or abnormal weight loss Eyes: Doesn't report blurriness of vision Ears, nose, mouth, throat, and face: Doesn't report sore throat Respiratory: Doesn't report cough, dyspnea or wheezes Cardiovascular: Doesn't report palpitation, chest discomfort  Gastrointestinal:  Doesn't report nausea, constipation, diarrhea GU: Doesn't report incontinence Skin: Doesn't report skin rashes Neurological: Per HPI Musculoskeletal: Doesn't report joint pain Behavioral/Psych: Doesn't report anxiety  Physical Exam: Vitals:   03/11/22 1149  BP: 127/86  Pulse: 85  Resp: 15  Temp: 98.4 F (36.9 C)  SpO2: 97%  KPS: 90. General: Fatigued Head: Normal EENT: No conjunctival injection or scleral icterus.  Lungs: Resp effort normal Cardiac: Regular rate Abdomen: Non-distended abdomen Skin: No rashes cyanosis or petechiae. Extremities: No clubbing or edema  Neurologic Exam: Mental Status: Awake, alert, attentive to examiner. Oriented to self and environment. Language is fluent with intact comprehension.  Modest psychomotor slowing, impaired 3 object recall Cranial Nerves: Visual acuity is grossly normal. Visual fields are full. Extra-ocular movements intact. No ptosis. Face is symmetric Motor: Tone and bulk are normal. Power is full in both arms and legs. Reflexes are symmetric, no pathologic reflexes present.  Sensory: Intact to light touch Gait: Normal.   Labs: I have reviewed the data as listed    Component Value Date/Time   NA 134 (L) 02/03/2022 1119   NA 138 05/04/2012 1815   K 4.2 02/03/2022 1119   K 3.5 05/04/2012 1815   CL  101 02/03/2022 1119   CL 99 05/04/2012 1815   CO2 27 02/03/2022 1119   CO2 29 05/04/2012 1815   GLUCOSE 321 (H) 02/03/2022 1119   GLUCOSE 69 05/04/2012 1815   BUN 12 02/03/2022 1119   BUN 12 05/04/2012 1815   CREATININE 0.78 02/03/2022 1119   CREATININE 0.84 10/21/2021 1457   CALCIUM 9.3 02/03/2022 1119   CALCIUM 9.2 05/04/2012 1815   PROT 7.1 02/03/2022 1119   PROT 8.5 (H) 05/04/2012 1815   ALBUMIN 4.6 02/03/2022 1119   ALBUMIN 4.5 05/04/2012 1815   AST 20 02/03/2022 1119   ALT 45 (H) 02/03/2022 1119   ALT 77 05/04/2012 1815   ALKPHOS 117 02/03/2022 1119   ALKPHOS 129 05/04/2012 1815   BILITOT 0.5 02/03/2022 1119   GFRNONAA >60 02/03/2022 1119   GFRNONAA 106 12/05/2020 1014   GFRAA 123 12/05/2020 1014   Lab Results  Component Value Date   WBC 8.0 03/11/2022   NEUTROABS 5.8 03/11/2022   HGB 16.0 03/11/2022   HCT 44.7 03/11/2022   MCV 83.1 03/11/2022   PLT 235 03/11/2022   Imaging:  [place] Clinician Interpretation: I have personally reviewed the CNS images as listed.  My interpretation, in the context of the patient's clinical presentation, is progressive disease  MR BRAIN W WO CONTRAST  Result Date: 03/09/2022 CLINICAL DATA:  51 year old male with oligodendroglioma. Evidence of Ependymal, subependymal and/or parenchymal metastatic disease affecting the right frontal and temporal horns. Restaging. EXAM: MRI HEAD WITHOUT AND WITH CONTRAST TECHNIQUE: Multiplanar, multiecho pulse sequences of the brain and surrounding structures were obtained without and with intravenous contrast. CONTRAST:  42m GADAVIST GADOBUTROL 1 MMOL/ML IV SOLN COMPARISON:  01/31/2022 brain MRI and earlier. FINDINGS: Brain: No midline shift. No restricted diffusion suggestive of acute infarction. No acute intracranial hemorrhage. Cervicomedullary junction and pituitary are within normal limits. Multifocal abnormal enhancement and T2/FLAIR hyperintensity in the right temporal lobe, right insula, and along the  right frontal horn and cingulate gyrus redemonstrated: The cingulate lesion remains small but is larger since last month, up to 5-6 mm now (series 16, image 78 versus 3 mm then. Also a nearby stable small cortically based area of right inferior frontal gyrus abnormal enhancement series 16, image 76. Ependymal enhancement at the right frontal horn is stable, series 16, image 69. Small nodular left cingulate and right posterosuperior temporal foci of enhancement are stable on series 16, image 61. Ependymal and periventricular enhancement along the junction of the temporal and occipital horns has progressed on series 16, images 44 and 54. Nearby right optic radiation region nodular enhancement  is stable on series 16, image 55. There is a new focus of abnormal posterior hippocampus enhancement now on series 16, image 46 and series 19, image 16. No obvious hippocampal edema. And additional multifocal right mesial temporal lobe nodular enhancement has mildly increased on series 16, images 32 and 35 - and the posterior nodule on image 35 is new and also demonstrated on series 19, image 14. Associated FLAIR hyperintense edema has progressed in the right parahippocampal gyrus (series 11, image 15). No associated ventriculomegaly or significant intracranial mass effect. No generalized dural thickening or enhancement. Anterior right insula cystic resection site with regional T2 and FLAIR hyperintensity, insula enlargement is stable. Vascular: Major intracranial vascular flow voids are stable. The major dural venous sinuses are enhancing and appear to be patent. Skull and upper cervical spine: Visible cervical spine and spinal cord appears stable and negative Visualized bone marrow signal is within normal limits. Small right superior frontal convexity burr hole redemonstrated. Sinuses/Orbits: Stable orbits. Increased and mild paranasal sinus mucosal thickening. Other: Mastoids remain clear. Visible internal auditory structures  appear stable, with suggestion of mild enhancement at the right IAC fundus not significantly changed from presentation MRI 12/21/2020. IMPRESSION: Multifocal mild progression of disease since last month, including small new enhancing lesions in the posterior right hippocampus (series 16, image 46) and right parahippocampal gyrus (series 16, image 35). Increased FLAIR hyperintensity in the right mesial temporal lobe. But no significant intracranial mass effect.  No ventriculomegaly. Electronically Signed   By: Genevie Ann M.D.   On: 03/09/2022 14:39    Assessment/Plan Oligodendroglioma ([place])  Focal seizures ([place])  Alan Wise is clinically stable today, now having completed cycle #8 of 5-day Temodar.  MRI brain demonstrates findings consistent with progression of enhancing foci of disease, first identified 2 months ago.  This pattern of progression remains unusual for a co-deleted oligodendroglioma, especially given overall stability or contraction of primary site of disease in the right insular cortex.  Temodar will be discontinued.  We discussed potential role for repeat biopsy, given potential for histologic transformation.  Also discussed referral to [facility] for clinical trial evaluation, potentially with newer class of IDH inhibitors.  Third option would be to proceed with second line chemotherapy, likely oral CCNU monotherapy.  He would like to discuss these options with his family prior to making any concrete decisions.   Prior: The following workup was completed given suspicion for LMD and/or enhancing infarcts: -MRI total spine mets screening study (normal) -LP for routine studies and cytology (no malignant cells, elevated protein) -TTE given some suspicion for cardioembolic process per discussion with radiology (normal) -Send tissue to [facility] for sequencing data (normal mutational burden)  Will con't Keppra 536m BID, no steroids.  Ok with Trazodone 518mHS for sleep issues.  We ask  that JoMarnee Springeach out to usKoreahortly with treatment plan decision, or further questions for guidance.  All questions were answered. The patient knows to call the clinic with any problems, questions or concerns. No barriers to learning were detected.  The total time spent in the encounter was 40 minutes and more than 50% was on counseling and review of test results   ZaVentura SellersMD Medical Director of Neuro-Oncology [facility]t WeGalena9/19/23 12:02 PM

## 2022-03-12 ENCOUNTER — Other Ambulatory Visit (HOSPITAL_COMMUNITY): Payer: Self-pay

## 2022-03-17 ENCOUNTER — Telehealth: Payer: Self-pay | Admitting: *Deleted

## 2022-03-17 NOTE — Telephone Encounter (Signed)
Reached out to patient to see if he had given his options that Dr Mickeal Skinner gave at his last visit thought and if he had made a decision.  Patient states he is still deciding and I encouraged him to let us know when a decision was made.  Routing to Dr Mickeal Skinner as update.

## 2022-03-27 ENCOUNTER — Other Ambulatory Visit (HOSPITAL_COMMUNITY): Payer: Self-pay

## 2022-03-27 ENCOUNTER — Telehealth: Payer: Self-pay | Admitting: *Deleted

## 2022-03-27 NOTE — Telephone Encounter (Signed)
Called patient to ask if he had made a decision yet about how to proceed with treatment.   He stated that he decided to proceed with a clinical trail at [facility] if available.  He states he is not willing to go out of state.  If that option is not feasible then he would be agreeable to chemo.  Notified Dr Mickeal Skinner of patients decision.  Offered social work to reach out to patient to help him walk through the process of deciding and he said that this was the route he wanted.

## 2022-05-01 ENCOUNTER — Ambulatory Visit: Payer: BC Managed Care – PPO | Admitting: Internal Medicine

## 2022-05-01 ENCOUNTER — Encounter: Payer: Self-pay | Admitting: Internal Medicine

## 2022-05-01 VITALS — BP 124/86 | HR 69 | Temp 96.4°F | Wt 158.0 lb

## 2022-05-01 DIAGNOSIS — E1165 Type 2 diabetes mellitus with hyperglycemia: Secondary | ICD-10-CM | POA: Diagnosis not present

## 2022-05-01 DIAGNOSIS — R569 Unspecified convulsions: Secondary | ICD-10-CM

## 2022-05-01 DIAGNOSIS — E782 Mixed hyperlipidemia: Secondary | ICD-10-CM

## 2022-05-01 DIAGNOSIS — C719 Malignant neoplasm of brain, unspecified: Secondary | ICD-10-CM

## 2022-05-01 DIAGNOSIS — K219 Gastro-esophageal reflux disease without esophagitis: Secondary | ICD-10-CM

## 2022-05-01 LAB — LIPID PANEL
Cholesterol: 183 mg/dL (ref <200)
HDL: 32 mg/dL — ABNORMAL LOW (ref >=40)
LDL Cholesterol (Calc): 119 mg/dL (calc) — ABNORMAL HIGH
Non-HDL Cholesterol (Calc): 151 mg/dL (calc) — ABNORMAL HIGH (ref <130)
Total CHOL/HDL Ratio: 5.7 (calc) — ABNORMAL HIGH (ref <5.0)
Triglycerides: 205 mg/dL — ABNORMAL HIGH (ref <150)

## 2022-05-01 LAB — COMPLETE METABOLIC PANEL WITH GFR
AG Ratio: 1.8 (calc) (ref 1.0–2.5)
ALT: 27 U/L (ref 9–46)
AST: 15 U/L (ref 10–35)
Albumin: 4.4 g/dL (ref 3.6–5.1)
Alkaline phosphatase (APISO): 115 U/L (ref 35–144)
BUN: 13 mg/dL (ref 7–25)
CO2: 27 mmol/L (ref 20–32)
Calcium: 9.3 mg/dL (ref 8.6–10.3)
Chloride: 99 mmol/L (ref 98–110)
Creat: 0.75 mg/dL (ref 0.70–1.30)
Globulin: 2.4 g/dL (calc) (ref 1.9–3.7)
Glucose, Bld: 281 mg/dL — ABNORMAL HIGH (ref 65–99)
Potassium: 4.4 mmol/L (ref 3.5–5.3)
Sodium: 136 mmol/L (ref 135–146)
Total Bilirubin: 0.5 mg/dL (ref 0.2–1.2)
Total Protein: 6.8 g/dL (ref 6.1–8.1)
eGFR: 110 mL/min/{1.73_m2} (ref >=60)

## 2022-05-01 NOTE — Progress Notes (Signed)
Subjective:    Patient ID: Alan Wise, male    DOB: [birth year], 52 y.o.   MRN: [id number]  HPI  Patient presents to clinic today for follow-up of chronic conditions.  HLD: His last LDL was 100, triglycerides 212, 10/2021.  He denies myalgias on Atorvastatin.  He tries to consume low-fat diet.  DM2: His last A1c was 8.7%, 03/2022.  He is taking Glipizide and Januvia as prescribed.  He does not check his sugars.  He checks his feet routinely.  His last eye exam was 2023, [facility].  Flu never.  Pneumovax never.  COVID [employer].  GERD: Triggered by chemo, currently not an issue.  He takes as needed with some relief of symptoms.  There is no upper GI on file.  Glioma with Focal Seizures: He is not currently on chemo and radiation. He is taking Keppra for seizure prevention.  He follows with neurology and oncology.  Review of Systems     Past Medical History:  Diagnosis Date   Allergy    Complication of anesthesia    pt reports he is starting to have more difficulty arousing after surgery   Diabetes mellitus ([place])    GERD (gastroesophageal reflux disease)    Headache    History of kidney stones    Hyperlipidemia    Renal disorder    kidney stones    Current Outpatient Medications  Medication Sig Dispense Refill   atorvastatin (LIPITOR) 40 MG tablet TAKE 1 TABLET BY MOUTH EVERY DAY 90 tablet 0   docusate sodium (COLACE) 100 MG capsule Take 1 capsule (100 mg total) by mouth 2 (two) times daily. 60 capsule 2   FREESTYLE LITE test strip USE TWO TIMES DAILY TO TEST BLOOD SUGAR 50 each 11   glipiZIDE (GLUCOTROL) 10 MG tablet Take 1 tablet (10 mg total) by mouth 2 (two) times daily. 180 tablet 1   JANUVIA 100 MG tablet TAKE 1 TABLET BY MOUTH EVERY DAY 90 tablet 0   levETIRAcetam (KEPPRA) 500 MG tablet TAKE 1 TABLET BY MOUTH TWICE A DAY 60 tablet 3   ondansetron (ZOFRAN) 8 MG tablet Take 1 tablet (8 mg total) by mouth 2 (two) times daily as needed (nausea and vomiting). May  take 30-60 minutes prior to Temodar administration if nausea/vomiting occurs. 30 tablet 1   prednisoLONE acetate (PRED FORTE) 1 % ophthalmic suspension Place 1 drop into both eyes 2 (two) times daily.     No current facility-administered medications for this visit.    Allergies  Allergen Reactions   Contrast Media [Iodinated Contrast Media] Swelling    Family History  Problem Relation Age of Onset   Hyperlipidemia Father    Hypertension Father    Diabetes Father    Benign prostatic hyperplasia Father    Stroke Maternal Grandmother    Diabetes Maternal Grandmother    Stroke Paternal Grandmother    Hypertension Paternal Grandmother    Kidney disease Neg Hx    Prostate cancer Neg Hx     Social History   Socioeconomic History   Marital status: Married    Spouse name: Not on file   Number of children: Not on file   Years of education: Not on file   Highest education level: Not on file  Occupational History   Not on file  Tobacco Use   Smoking status: Never   Smokeless tobacco: Never  Vaping Use   Vaping Use: Never used  Substance and Sexual Activity   Alcohol  use: Not Currently    Comment: rare   Drug use: No   Sexual activity: Not on file  Other Topics Concern   Not on file  Social History Narrative   Not on file   Social Determinants of Health   Financial Resource Strain: Not on file  Food Insecurity: Not on file  Transportation Needs: Not on file  Physical Activity: Not on file  Stress: Not on file  Social Connections: Not on file  Intimate Partner Violence: Not on file     Constitutional: Patient reports fatigue.  Denies fever, malaise, headache or abrupt weight changes.  HEENT: Denies eye pain, eye redness, ear pain, ringing in the ears, wax buildup, runny nose, nasal congestion, bloody nose, or sore throat. Respiratory: Denies difficulty breathing, shortness of breath, cough or sputum production.   Cardiovascular: Denies chest pain, chest tightness,  palpitations or swelling in the hands or feet.  Gastrointestinal: Patient reports intermittent reflux.  Denies abdominal pain, bloating, constipation, diarrhea or blood in the stool.  GU: Denies urgency, frequency, pain with urination, burning sensation, blood in urine, odor or discharge. Musculoskeletal: Denies decrease in range of motion, difficulty with gait, muscle pain or joint pain and swelling.  Skin: Denies redness, rashes, lesions or ulcercations.  Neurological: Patient reports difficulty with attention.  Denies dizziness, difficulty with memory, difficulty with speech or problems with balance and coordination.  Psych: Denies anxiety, depression, SI/HI.  No other specific complaints in a complete review of systems (except as listed in HPI above).  Objective:   Physical Exam  BP 124/86 (BP Location: Left Arm, Patient Position: Sitting, Cuff Size: Normal)   Pulse 69   Temp (!) 96.4 F (35.8 C) (Temporal)   Wt 158 lb (71.7 kg)   SpO2 99%   BMI 23.67 kg/m   Wt Readings from Last 3 Encounters:  03/11/22 158 lb 3.2 oz (71.8 kg)  02/03/22 161 lb 8 oz (73.3 kg)  01/29/22 159 lb (72.1 kg)    General: Appears his stated age, well developed, well nourished in NAD. Skin: Warm, dry and intact. No ulcerations noted. HEENT: Head: normal shape and size; Eyes: sclera white, no icterus, conjunctiva pink, PERRLA and EOMs intact;  Cardiovascular: Normal rate and rhythm. Pulmonary/Chest: Normal effort and positive vesicular breath sounds.  Musculoskeletal: No difficulty with gait.  Neurological: Alert and oriented.  Psychiatric: Mood and affect normal. Behavior is normal. Judgment and thought content normal.   BMET    Component Value Date/Time   NA 136 03/11/2022 1130   NA 138 05/04/2012 1815   K 4.2 03/11/2022 1130   K 3.5 05/04/2012 1815   CL 100 03/11/2022 1130   CL 99 05/04/2012 1815   CO2 33 (H) 03/11/2022 1130   CO2 29 05/04/2012 1815   GLUCOSE 334 (H) 03/11/2022 1130    GLUCOSE 69 05/04/2012 1815   BUN 14 03/11/2022 1130   BUN 12 05/04/2012 1815   CREATININE 0.89 03/11/2022 1130   CREATININE 0.84 10/21/2021 1457   CALCIUM 9.4 03/11/2022 1130   CALCIUM 9.2 05/04/2012 1815   GFRNONAA >60 03/11/2022 1130   GFRNONAA 106 12/05/2020 1014   GFRAA 123 12/05/2020 1014    Lipid Panel     Component Value Date/Time   CHOL 164 10/21/2021 1457   TRIG 212 (H) 10/21/2021 1457   HDL 31 (L) 10/21/2021 1457   CHOLHDL 5.3 (H) 10/21/2021 1457   VLDL 60.6 (H) 08/30/2020 1506   LDLCALC 100 (H) 10/21/2021 1457    CBC  Component Value Date/Time   WBC 8.0 03/11/2022 1130   WBC 6.5 10/21/2021 1457   RBC 5.38 03/11/2022 1130   HGB 16.0 03/11/2022 1130   HGB 16.4 05/04/2012 1815   HCT 44.7 03/11/2022 1130   HCT 48.4 05/04/2012 1815   PLT 235 03/11/2022 1130   PLT 261 05/04/2012 1815   MCV 83.1 03/11/2022 1130   MCV 86 05/04/2012 1815   MCH 29.7 03/11/2022 1130   MCHC 35.8 03/11/2022 1130   RDW 12.2 03/11/2022 1130   RDW 12.7 05/04/2012 1815   LYMPHSABS 1.0 03/11/2022 1130   MONOABS 0.5 03/11/2022 1130   EOSABS 0.6 (H) 03/11/2022 1130   BASOSABS 0.1 03/11/2022 1130    Hgb A1C Lab Results  Component Value Date   HGBA1C 9.1 (A) 01/29/2022           Assessment & Plan:     RTC in 3 months for follow-up of chronic conditions Webb Silversmith, NP

## 2022-05-01 NOTE — Patient Instructions (Signed)

## 2022-05-01 NOTE — Assessment & Plan Note (Signed)
Currently not an issue °We will monitor °

## 2022-05-01 NOTE — Assessment & Plan Note (Signed)
C-Met and lipid profile today Encouraged him to consume a low-fat diet Continue atorvastatin 

## 2022-05-01 NOTE — Assessment & Plan Note (Signed)
Recent A1c reviewed Discussed goal of A1c less than 7.5%, higher than this could increase his risk for heart attack and stroke Urine microalbumin has been checked within the last year Encouraged him to consume a low-carb diet Continue Januvia and glipizide, he is hesitant to add additional medications at this time Encourage routine eye exam Encourage routine foot exam He declines flu and pneumonia vaccines Encouraged him to get his COVID booster

## 2022-05-01 NOTE — Assessment & Plan Note (Signed)
Continue Keppra per neurology

## 2022-05-01 NOTE — Assessment & Plan Note (Signed)
He will continue to follow with neurology and oncology

## 2022-05-02 ENCOUNTER — Other Ambulatory Visit: Payer: Self-pay

## 2022-05-05 ENCOUNTER — Other Ambulatory Visit: Payer: Self-pay | Admitting: Internal Medicine

## 2022-05-05 NOTE — Telephone Encounter (Signed)
Requested Prescriptions  Pending Prescriptions Disp Refills   glipiZIDE (GLUCOTROL) 10 MG tablet [Pharmacy Med Name: GLIPIZIDE 10 MG TABLET] 180 tablet 1    Sig: TAKE 1 TABLET BY MOUTH TWICE A DAY     Endocrinology:  Diabetes - Sulfonylureas Failed - 05/05/2022  2:07 AM      Failed - HBA1C is between 0 and 7.9 and within 180 days    Hemoglobin A1C  Date Value Ref Range Status  01/29/2022 9.1 (A) 4.0 - 5.6 % Final   Hgb A1c MFr Bld  Date Value Ref Range Status  10/21/2021 8.2 (H) <5.7 % of total Hgb Final    Comment:    For someone without known diabetes, a hemoglobin A1c value of 6.5% or greater indicates that they may have  diabetes and this should be confirmed with a follow-up  test. . For someone with known diabetes, a value <7% indicates  that their diabetes is well controlled and a value  greater than or equal to 7% indicates suboptimal  control. A1c targets should be individualized based on  duration of diabetes, age, comorbid conditions, and  other considerations. . Currently, no consensus exists regarding use of hemoglobin A1c for diagnosis of diabetes for children. .          Passed - Cr in normal range and within 360 days    Creat  Date Value Ref Range Status  05/01/2022 0.75 0.70 - 1.30 mg/dL Final   Creatinine,U  Date Value Ref Range Status  04/24/2020 113.1 mg/dL Final   Creatinine, Urine  Date Value Ref Range Status  10/21/2021 140 20 - 320 mg/dL Final         Passed - Valid encounter within last 6 months    Recent Outpatient Visits           4 days ago Type 2 diabetes mellitus with hyperglycemia, without long-term current use of insulin ([place])   [facility], [place] W, NP   3 months ago Type 2 diabetes mellitus with hyperglycemia, without long-term current use of insulin [facility])   [facility], Coralie Keens, NP   6 months ago Encounter for general adult medical examination with abnormal findings   [facility], Coralie Keens, NP   1 year ago [facility])   [facility], Coralie Keens, NP   1 year ago Seizure-like activity [facility])   [facility], Coralie Keens, NP       Future Appointments             In 2 months [facility], Coralie Keens, NP [facility], [facility]             atorvastatin (LIPITOR) 40 MG tablet [Pharmacy Med Name: ATORVASTATIN 40 MG TABLET] 90 tablet 0    Sig: TAKE 1 TABLET BY MOUTH EVERY DAY     Cardiovascular:  Antilipid - Statins Failed - 05/05/2022  2:07 AM      Failed - Lipid Panel in normal range within the last 12 months    Cholesterol  Date Value Ref Range Status  05/01/2022 183 <200 mg/dL Final   LDL Cholesterol (Calc)  Date Value Ref Range Status  05/01/2022 119 (H) mg/dL (calc) Final    Comment:    Reference range: <100 . Desirable range <100 mg/dL for primary prevention;   <70 mg/dL for patients with CHD or diabetic patients  with > or = 2 CHD  risk factors. Marland Kitchen LDL-C is now calculated using the Martin-Hopkins  calculation, which is a validated novel method providing  better accuracy than the Friedewald equation in the  estimation of LDL-C.  Cresenciano Genre et al. Annamaria Helling. 3734;287(68): 2061-2068  (http://education.[employer].com/faq/FAQ164)    Direct LDL  Date Value Ref Range Status  08/30/2020 140.0 mg/dL Final    Comment:    Optimal:  <100 mg/dLNear or Above Optimal:  100-129 mg/dLBorderline High:  130-159 mg/dLHigh:  160-189 mg/dLVery High:  >190 mg/dL   HDL  Date Value Ref Range Status  05/01/2022 32 (L) > OR = 40 mg/dL Final   Triglycerides  Date Value Ref Range Status  05/01/2022 205 (H) <150 mg/dL Final    Comment:    . If a non-fasting specimen was collected, consider repeat triglyceride testing on a fasting specimen if clinically indicated.  Yates Decamp et al. J. of Clin. Lipidol. 1157;2:620-355. Marland Kitchen          Passed - Patient is not pregnant      Passed - Valid encounter within  last 12 months    Recent Outpatient Visits           4 days ago Type 2 diabetes mellitus with hyperglycemia, without long-term current use of insulin [facility])   [facility] [place], [place] W, NP   3 months ago Type 2 diabetes mellitus with hyperglycemia, without long-term current use of insulin [facility])   [facility], Coralie Keens, NP   6 months ago Encounter for general adult medical examination with abnormal findings   [facility], Coralie Keens, NP   1 year ago [facility])   [facility], Coralie Keens, NP   1 year ago Seizure-like activity [facility])   [facility], Coralie Keens, NP       Future Appointments             In 2 months Baity, Coralie Keens, NP [facility], [facility]

## 2022-05-06 ENCOUNTER — Other Ambulatory Visit: Payer: Self-pay | Admitting: Internal Medicine

## 2022-05-06 NOTE — Telephone Encounter (Signed)
Requested medication (s) are due for refill today: yes  Requested medication (s) are on the active medication list: yes  Last refill:  01/27/22 #90 0 refills  Future visit scheduled: yes in 2 months  Notes to clinic:  protocol failed last lab 01/29/22. No refills remain. Do you want to refill Rx?     Requested Prescriptions  Pending Prescriptions Disp Refills   JANUVIA 100 MG tablet [Pharmacy Med Name: JANUVIA 100 MG TABLET] 90 tablet 0    Sig: TAKE 1 TABLET BY [place]     Endocrinology:  Diabetes - DPP-4 Inhibitors Failed - 05/06/2022  2:04 AM      Failed - HBA1C is between 0 and 7.9 and within 180 days    Hemoglobin A1C  Date Value Ref Range Status  01/29/2022 9.1 (A) 4.0 - 5.6 % Final   Hgb A1c MFr Bld  Date Value Ref Range Status  10/21/2021 8.2 (H) <5.7 % of total Hgb Final    Comment:    For someone without known diabetes, a hemoglobin A1c value of 6.5% or greater indicates that they may have  diabetes and this should be confirmed with a follow-up  test. . For someone with known diabetes, a value <7% indicates  that their diabetes is well controlled and a value  greater than or equal to 7% indicates suboptimal  control. A1c targets should be individualized based on  duration of diabetes, age, comorbid conditions, and  other considerations. . Currently, no consensus exists regarding use of hemoglobin A1c for diagnosis of diabetes for children. .          Passed - Cr in normal range and within 360 days    Creat  Date Value Ref Range Status  05/01/2022 0.75 0.70 - 1.30 mg/dL Final   Creatinine,U  Date Value Ref Range Status  04/24/2020 113.1 mg/dL Final   Creatinine, Urine  Date Value Ref Range Status  10/21/2021 140 20 - 320 mg/dL Final         Passed - Valid encounter within last 6 months    Recent Outpatient Visits           5 days ago Type 2 diabetes mellitus with hyperglycemia, without long-term current use of insulin [facility])   [facility] [place], [place] W, NP   3 months ago Type 2 diabetes mellitus with hyperglycemia, without long-term current use of insulin 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

## 2022-06-03 ENCOUNTER — Telehealth: Payer: Self-pay | Admitting: *Deleted

## 2022-06-03 NOTE — Telephone Encounter (Signed)
Routine follow up call outbound to patient to check in since we hadn't heard from him in a while on his treatment and follow up.  Left message, hoping for call back.

## 2022-08-01 ENCOUNTER — Ambulatory Visit: Payer: BC Managed Care – PPO | Admitting: Internal Medicine

## 2022-08-01 ENCOUNTER — Encounter: Payer: Self-pay | Admitting: Internal Medicine

## 2022-08-01 VITALS — BP 126/82 | HR 93 | Temp 96.7°F | Wt 158.0 lb

## 2022-08-01 DIAGNOSIS — E782 Mixed hyperlipidemia: Secondary | ICD-10-CM | POA: Diagnosis not present

## 2022-08-01 DIAGNOSIS — E1165 Type 2 diabetes mellitus with hyperglycemia: Secondary | ICD-10-CM | POA: Diagnosis not present

## 2022-08-01 DIAGNOSIS — Z91199 Patient's noncompliance with other medical treatment and regimen due to unspecified reason: Secondary | ICD-10-CM

## 2022-08-01 NOTE — Progress Notes (Signed)
Subjective:    Patient ID: Alan Wise, male    DOB: 10-25-1970, 52 y.o.   MRN: [id number]  HPI  Patient presents to clinic today for 84-monthfollow-up of HLD and DM2.  HLD: His last LDL was 119, triglycerides 205, 04/2022.  He is not taking Atorvastatin as prescribed.  He tries to consume a low-fat diet.  DM2: His last A1c was 8.7, 03/2022.  He is not taking Glipizide and Januvia as prescribed.  He does not check his sugars.  He checks his feet routinely.  His last eye exam was 02/2021.  Flu never.  Pneumovax never.  COVID J[place] x 1.   Review of Systems     Past Medical History:  Diagnosis Date   Allergy    Complication of anesthesia    pt reports he is starting to have more difficulty arousing after surgery   Diabetes mellitus ([place]    GERD (gastroesophageal reflux disease)    Headache    History of kidney stones    Hyperlipidemia    Renal disorder    kidney stones    Current Outpatient Medications  Medication Sig Dispense Refill   atorvastatin (LIPITOR) 40 MG tablet TAKE 1 TABLET BY MOUTH EVERY DAY 90 tablet 3   docusate sodium (COLACE) 100 MG capsule Take 1 capsule (100 mg total) by mouth 2 (two) times daily. 60 capsule 2   FREESTYLE LITE test strip USE TWO TIMES DAILY TO TEST BLOOD SUGAR 50 each 11   glipiZIDE (GLUCOTROL) 10 MG tablet TAKE 1 TABLET BY MOUTH TWICE A DAY 180 tablet 0   JANUVIA 100 MG tablet TAKE 1 TABLET BY MOUTH EVERY DAY 90 tablet 0   levETIRAcetam (KEPPRA) 500 MG tablet TAKE 1 TABLET BY MOUTH TWICE A DAY 60 tablet 3   ondansetron (ZOFRAN) 8 MG tablet Take 1 tablet (8 mg total) by mouth 2 (two) times daily as needed (nausea and vomiting). May take 30-60 minutes prior to Temodar administration if nausea/vomiting occurs. 30 tablet 1   prednisoLONE acetate (PRED FORTE) 1 % ophthalmic suspension Place 1 drop into both eyes 2 (two) times daily.     No current facility-administered medications for this visit.    Allergies  Allergen Reactions    Contrast Media [Iodinated Contrast Media] Swelling    Family History  Problem Relation Age of Onset   Hyperlipidemia Father    Hypertension Father    Diabetes Father    Benign prostatic hyperplasia Father    Stroke Maternal Grandmother    Diabetes Maternal Grandmother    Stroke Paternal Grandmother    Hypertension Paternal Grandmother    Kidney disease Neg Hx    Prostate cancer Neg Hx     Social History   Socioeconomic History   Marital status: Married    Spouse name: Not on file   Number of children: Not on file   Years of education: Not on file   Highest education level: Not on file  Occupational History   Not on file  Tobacco Use   Smoking status: Never   Smokeless tobacco: Never  Vaping Use   Vaping Use: Never used  Substance and Sexual Activity   Alcohol use: Not Currently    Comment: rare   Drug use: No   Sexual activity: Not on file  Other Topics Concern   Not on file  Social History Narrative   Not on file   Social Determinants of Health   Financial Resource Strain: Not on file  Food Insecurity: Not on file  Transportation Needs: Not on file  Physical Activity: Not on file  Stress: Not on file  Social Connections: Not on file  Intimate Partner Violence: Not on file     Constitutional: Denies fever, malaise, fatigue, headache or abrupt weight changes.  HEENT: Patient reports vision changes.  Denies eye pain, eye redness, ear pain, ringing in the ears, wax buildup, runny nose, nasal congestion, bloody nose, or sore throat. Respiratory: Pt reports cough. Denies difficulty breathing, shortness of breath, or sputum production.   Cardiovascular: Denies chest pain, chest tightness, palpitations or swelling in the hands or feet.  Gastrointestinal: Denies abdominal pain, bloating, constipation, diarrhea or blood in the stool.  GU: Patient reports intermittent nausea and reflux.  Denies urgency, frequency, pain with urination, burning sensation, blood in  urine, odor or discharge. Musculoskeletal: Denies decrease in range of motion, difficulty with gait, muscle pain or joint pain and swelling.  Skin: Denies redness, rashes, lesions or ulcercations.  Neurological: Pt reports difficulty with memory. Denies dizziness, difficulty with speech or problems with balance and coordination.  Psych: Denies anxiety, depression, SI/HI.  No other specific complaints in a complete review of systems (except as listed in HPI above).  Objective:   Physical Exam  BP 126/82 (BP Location: Left Arm, Patient Position: Sitting, Cuff Size: Normal)   Pulse 93   Temp (!) 96.7 F (35.9 C) (Temporal)   Wt 158 lb (71.7 kg)   SpO2 99%   BMI 23.67 kg/m   Wt Readings from Last 3 Encounters:  05/01/22 158 lb (71.7 kg)  03/11/22 158 lb 3.2 oz (71.8 kg)  02/03/22 161 lb 8 oz (73.3 kg)    General: Appears his stated age, well developed, well nourished in NAD. Skin: Warm, dry and intact. No ulcerations noted. HEENT: Head: normal shape and size; Eyes: sclera white, no icterus, conjunctiva pink, PERRLA and EOMs intact;  Cardiovascular: Normal rate and rhythm. S1,S2 noted.  No murmur, rubs or gallops noted. No JVD or BLE edema. No carotid bruits noted. Pulmonary/Chest: Normal effort and positive vesicular breath sounds. No respiratory distress. No wheezes, rales or ronchi noted.  Musculoskeletal: No difficulty with gait.  Neurological: Alert and oriented. Coordination normal.  Psychiatric: Mood and affect mildly flat. Behavior is normal. Judgment and thought content normal.     BMET    Component Value Date/Time   NA 136 05/01/2022 1011   NA 138 05/04/2012 1815   K 4.4 05/01/2022 1011   K 3.5 05/04/2012 1815   CL 99 05/01/2022 1011   CL 99 05/04/2012 1815   CO2 27 05/01/2022 1011   CO2 29 05/04/2012 1815   GLUCOSE 281 (H) 05/01/2022 1011   GLUCOSE 69 05/04/2012 1815   BUN 13 05/01/2022 1011   BUN 12 05/04/2012 1815   CREATININE 0.75 05/01/2022 1011    CALCIUM 9.3 05/01/2022 1011   CALCIUM 9.2 05/04/2012 1815   GFRNONAA >60 03/11/2022 1130   GFRNONAA 106 12/05/2020 1014   GFRAA 123 12/05/2020 1014    Lipid Panel     Component Value Date/Time   CHOL 183 05/01/2022 1011   TRIG 205 (H) 05/01/2022 1011   HDL 32 (L) 05/01/2022 1011   CHOLHDL 5.7 (H) 05/01/2022 1011   VLDL 60.6 (H) 08/30/2020 1506   LDLCALC 119 (H) 05/01/2022 1011    CBC    Component Value Date/Time   WBC 8.0 03/11/2022 1130   WBC 6.5 10/21/2021 1457   RBC 5.38 03/11/2022 1130  HGB 16.0 03/11/2022 1130   HGB 16.4 05/04/2012 1815   HCT 44.7 03/11/2022 1130   HCT 48.4 05/04/2012 1815   PLT 235 03/11/2022 1130   PLT 261 05/04/2012 1815   MCV 83.1 03/11/2022 1130   MCV 86 05/04/2012 1815   MCH 29.7 03/11/2022 1130   MCHC 35.8 03/11/2022 1130   RDW 12.2 03/11/2022 1130   RDW 12.7 05/04/2012 1815   LYMPHSABS 1.0 03/11/2022 1130   MONOABS 0.5 03/11/2022 1130   EOSABS 0.6 (H) 03/11/2022 1130   BASOSABS 0.1 03/11/2022 1130    Hgb A1C Lab Results  Component Value Date   HGBA1C 9.1 (A) 01/29/2022            Assessment & Plan:     RTC in 3 months for annual exam Webb [place], NP

## 2022-08-01 NOTE — Patient Instructions (Signed)

## 2022-08-01 NOTE — Assessment & Plan Note (Signed)
C-Met and lipid profile today Encouraged him to consume low-fat diet He stopped taking atorvastatin and does not want to restart at this time

## 2022-08-01 NOTE — Assessment & Plan Note (Signed)
POCT A1c 9.1% Urine microalbumin has been checked within the last year He no longer wants to take glipizide or Januvia Encourage low-carb diet Encouraged routine eye exam Encouraged routine foot exam He declines immunizations

## 2022-08-02 ENCOUNTER — Other Ambulatory Visit: Payer: Self-pay

## 2022-08-02 ENCOUNTER — Encounter: Payer: Self-pay | Admitting: Internal Medicine

## 2022-08-02 LAB — LIPID PANEL
Cholesterol: 204 mg/dL — ABNORMAL HIGH (ref <200)
HDL: 30 mg/dL — ABNORMAL LOW (ref >=40)
LDL Cholesterol (Calc): 139 mg/dL (calc) — ABNORMAL HIGH
Non-HDL Cholesterol (Calc): 174 mg/dL (calc) — ABNORMAL HIGH (ref <130)
Total CHOL/HDL Ratio: 6.8 (calc) — ABNORMAL HIGH (ref <5.0)
Triglycerides: 211 mg/dL — ABNORMAL HIGH (ref <150)

## 2022-08-02 LAB — COMPLETE METABOLIC PANEL WITH GFR
AG Ratio: 1.8 (calc) (ref 1.0–2.5)
ALT: 23 U/L (ref 9–46)
AST: 14 U/L (ref 10–35)
Albumin: 4.4 g/dL (ref 3.6–5.1)
Alkaline phosphatase (APISO): 116 U/L (ref 35–144)
BUN: 12 mg/dL (ref 7–25)
CO2: 29 mmol/L (ref 20–32)
Calcium: 9.3 mg/dL (ref 8.6–10.3)
Chloride: 100 mmol/L (ref 98–110)
Creat: 0.86 mg/dL (ref 0.70–1.30)
Globulin: 2.5 g/dL (calc) (ref 1.9–3.7)
Glucose, Bld: 246 mg/dL — ABNORMAL HIGH (ref 65–99)
Potassium: 4.7 mmol/L (ref 3.5–5.3)
Sodium: 138 mmol/L (ref 135–146)
Total Bilirubin: 0.6 mg/dL (ref 0.2–1.2)
Total Protein: 6.9 g/dL (ref 6.1–8.1)
eGFR: 105 mL/min/{1.73_m2} (ref >=60)

## 2022-08-05 ENCOUNTER — Other Ambulatory Visit: Payer: Self-pay | Admitting: Internal Medicine

## 2022-08-05 NOTE — Telephone Encounter (Signed)
Requested medication (s) are due for refill today: yes  Requested medication (s) are on the active medication list: yes  Last refill:  05/05/22 #180 0 refills  Future visit scheduled: yes in 2 months  Notes to clinic:  protocol failed. Last lab 01/29/22. Do you want to refill Rx?     Requested Prescriptions  Pending Prescriptions Disp Refills   glipiZIDE (GLUCOTROL) 10 MG tablet [Pharmacy Med Name: GLIPIZIDE 10 MG TABLET] 180 tablet 0    Sig: TAKE 1 TABLET BY MOUTH TWICE A DAY     Endocrinology:  Diabetes - Sulfonylureas Failed - 08/05/2022  2:20 AM      Failed - HBA1C is between 0 and 7.9 and within 180 days    Hemoglobin A1C  Date Value Ref Range Status  01/29/2022 9.1 (A) 4.0 - 5.6 % Final   Hgb A1c MFr Bld  Date Value Ref Range Status  10/21/2021 8.2 (H) <5.7 % of total Hgb Final    Comment:    For someone without known diabetes, a hemoglobin A1c value of 6.5% or greater indicates that they may have  diabetes and this should be confirmed with a follow-up  test. . For someone with known diabetes, a value <7% indicates  that their diabetes is well controlled and a value  greater than or equal to 7% indicates suboptimal  control. A1c targets should be individualized based on  duration of diabetes, age, comorbid conditions, and  other considerations. . Currently, no consensus exists regarding use of hemoglobin A1c for diagnosis of diabetes for children. .          Passed - Cr in normal range and within 360 days    Creat  Date Value Ref Range Status  08/01/2022 0.86 0.70 - 1.30 mg/dL Final   Creatinine,U  Date Value Ref Range Status  04/24/2020 113.1 mg/dL Final   Creatinine, Urine  Date Value Ref Range Status  10/21/2021 140 20 - 320 mg/dL Final         Passed - Valid encounter within last 6 months    Recent Outpatient Visits           4 days ago Type 2 diabetes mellitus with hyperglycemia, without long-term current use of insulin [facility])   [facility] [place], [place] W, NP   3 months ago Type 2 diabetes mellitus with hyperglycemia, without long-term current use of insulin [facility])   [facility] [place], [place] W, NP   6 months ago Type 2 diabetes mellitus with hyperglycemia, without long-term current use of insulin [facility])   [facility] [place], Coralie Keens, NP   9 months ago Encounter for general adult medical examination with abnormal findings   [facility] [place], Coralie Keens, NP   1 year ago [facility])   [facility] [place], Coralie Keens, NP       Future Appointments             In 2 months Baity, Coralie Keens, NP [facility], [facility]

## 2022-08-06 ENCOUNTER — Other Ambulatory Visit: Payer: Self-pay | Admitting: Internal Medicine

## 2022-08-06 NOTE — Telephone Encounter (Signed)
Requested Prescriptions  Pending Prescriptions Disp Refills   JANUVIA 100 MG tablet [Pharmacy Med Name: JANUVIA 100 MG TABLET] 90 tablet 0    Sig: TAKE 1 TABLET BY [place]     Endocrinology:  Diabetes - DPP-4 Inhibitors Failed - 08/06/2022  2:44 AM      Failed - HBA1C is between 0 and 7.9 and within 180 days    Hemoglobin A1C  Date Value Ref Range Status  01/29/2022 9.1 (A) 4.0 - 5.6 % Final   Hgb A1c MFr Bld  Date Value Ref Range Status  10/21/2021 8.2 (H) <5.7 % of total Hgb Final    Comment:    For someone without known diabetes, a hemoglobin A1c value of 6.5% or greater indicates that they may have  diabetes and this should be confirmed with a follow-up  test. . For someone with known diabetes, a value <7% indicates  that their diabetes is well controlled and a value  greater than or equal to 7% indicates suboptimal  control. A1c targets should be individualized based on  duration of diabetes, age, comorbid conditions, and  other considerations. . Currently, no consensus exists regarding use of hemoglobin A1c for diagnosis of diabetes for children. .          Passed - Cr in normal range and within 360 days    Creat  Date Value Ref Range Status  08/01/2022 0.86 0.70 - 1.30 mg/dL Final   Creatinine,U  Date Value Ref Range Status  04/24/2020 113.1 mg/dL Final   Creatinine, Urine  Date Value Ref Range Status  10/21/2021 140 20 - 320 mg/dL Final         Passed - Valid encounter within last 6 months    Recent Outpatient Visits           5 days ago Type 2 diabetes mellitus with hyperglycemia, without long-term current use of insulin [facility])   [facility] [place], [place] W, NP   3 months ago Type 2 diabetes mellitus with hyperglycemia, without long-term current use of insulin [facility])   [facility] [place], [place] W, NP   6 months ago Type 2 diabetes mellitus with hyperglycemia, without long-term current use of  insulin [facility])   [facility] [place], Coralie Keens, NP   9 months ago Encounter for general adult medical examination with abnormal findings   [facility] [place], Coralie Keens, NP   1 year ago [facility])   [facility], Coralie Keens, NP       Future Appointments             In 2 months Baity, Coralie Keens, NP [facility], [facility]

## 2022-08-14 ENCOUNTER — Encounter: Payer: Self-pay | Admitting: Internal Medicine

## 2022-08-15 ENCOUNTER — Other Ambulatory Visit: Payer: Self-pay

## 2022-08-15 DIAGNOSIS — C719 Malignant neoplasm of brain, unspecified: Secondary | ICD-10-CM

## 2022-08-19 ENCOUNTER — Telehealth: Payer: Self-pay | Admitting: Internal Medicine

## 2022-08-19 ENCOUNTER — Other Ambulatory Visit: Payer: Self-pay

## 2022-08-19 NOTE — Telephone Encounter (Signed)
Per 2/27 IB reached out to patient to schedule, patient aware of date and time of appointment.

## 2022-08-23 ENCOUNTER — Ambulatory Visit (HOSPITAL_COMMUNITY)
Admission: RE | Admit: 2022-08-23 | Discharge: 2022-08-23 | Disposition: A | Discharge status: Home / Self Care | Payer: BC Managed Care – PPO | Source: Ambulatory Visit | Attending: Internal Medicine | Admitting: Internal Medicine

## 2022-08-23 DIAGNOSIS — C719 Malignant neoplasm of brain, unspecified: Secondary | ICD-10-CM | POA: Diagnosis present

## 2022-08-23 MED ORDER — GADOBUTROL 1 MMOL/ML IV SOLN
7.0000 mL | Freq: Once | INTRAVENOUS | Status: AC | PRN
  Administered 2022-08-23: 7 mL via INTRAVENOUS

## 2022-09-01 ENCOUNTER — Inpatient Hospital Stay: Payer: BC Managed Care – PPO | Admitting: Internal Medicine

## 2022-09-01 ENCOUNTER — Inpatient Hospital Stay: Payer: BC Managed Care – PPO | Attending: Internal Medicine

## 2022-09-01 VITALS — BP 133/86 | HR 70 | Temp 97.0°F | Resp 18 | Wt 150.8 lb

## 2022-09-01 DIAGNOSIS — R569 Unspecified convulsions: Secondary | ICD-10-CM

## 2022-09-01 DIAGNOSIS — C719 Malignant neoplasm of brain, unspecified: Secondary | ICD-10-CM | POA: Diagnosis not present

## 2022-09-01 DIAGNOSIS — C711 Malignant neoplasm of frontal lobe: Secondary | ICD-10-CM | POA: Diagnosis present

## 2022-09-01 NOTE — Progress Notes (Signed)
Alan Wise at [place], [place] [address] [phone]  Interval Evaluation  Date of Service: 09/01/22 Patient Name: Alan Wise Patient MRN: [id number] Patient DOB: [birth year] Provider: Ventura Sellers, MD  Identifying Statement:  Alan Wise is a 52 y.o. male with R frontal oligodendroglioma   Oncology History  Oligodendroglioma ([name])  01/17/2021 Surgery   Stereotactic biopsy with Dr. Marcello Moores; path demonstrates IDH-1 mutant oligodendroglioma   03/20/2021 - 04/30/2021 Radiation Therapy   Completes 54 Gy IMRT and Temodar with Dr. Isidore Moos   06/02/2021 - 03/05/2022 Chemotherapy   Completes 8 cycles of adjuvant 5-day Temozolomide   02/01/2022 Progression   Progression of disease in multifocal/metastatic/lmd pattern   03/11/2022 Progression   Progression of disease confirmed after additional cycle of TMZ, 1 month follow up MRI.  LP/cytology and MRI spine mets screening unremarkable     Biomarkers:   Interval History: [name] presents today for follow up after recent MRI brain.  This is first study since his biopsy at [facility] 5 months ago.  Doesn't describe any new or progressive neurologic deficits.  No issues with gait, short term memory compared to prior.  He is working partly from home and is involved in more childcare lately.  No further seizures, he self dc'd Keppra in November.  H+P (12/31/20) Patient presented to medical attention this past month, with complaint of involuntary left sided shaking.  He describes episodes, occurring several times per week, of left arm shaking, lasting up to 5 minutes; following the episodes he describes the arm as "heavy" for some time before returning to normal.  He also describes episodes of sudden inability to speak, with strange movements of face and hands, lasting same amount of time.  These episodes go back ~10 years in very sporadic nature, but have become much more frequent  in recent months.  He works full time as a Health and safety inspector at [employer].  These episodes have occurred at work, and have been disruptive in that environment.  He is otherwise fully functional, independent.  Medications: Current Outpatient Medications on File Prior to Visit  Medication Sig Dispense Refill   glipiZIDE (GLUCOTROL) 10 MG tablet TAKE 1 TABLET BY MOUTH TWICE A DAY 180 tablet 0   JANUVIA 100 MG tablet TAKE 1 TABLET BY MOUTH EVERY DAY 90 tablet 0   atorvastatin (LIPITOR) 40 MG tablet TAKE 1 TABLET BY MOUTH EVERY DAY (Patient not taking: Reported on 08/01/2022) 90 tablet 3   FREESTYLE LITE test strip USE TWO TIMES DAILY TO TEST BLOOD SUGAR (Patient not taking: Reported on 08/01/2022) 50 each 11   levETIRAcetam (KEPPRA) 500 MG tablet TAKE 1 TABLET BY MOUTH TWICE A DAY (Patient not taking: Reported on 08/01/2022) 60 tablet 3   No current facility-administered medications on file prior to visit.    Allergies:  Allergies  Allergen Reactions   Contrast Media [Iodinated Contrast Media] Swelling   Past Medical History:  Past Medical History:  Diagnosis Date   Allergy    Complication of anesthesia    pt reports he is starting to have more difficulty arousing after surgery   Diabetes mellitus ([place])    GERD (gastroesophageal reflux disease)    Headache    History of kidney stones    Hyperlipidemia    Renal disorder    kidney stones   Past Surgical History:  Past Surgical History:  Procedure Laterality Date   APPLICATION OF CRANIAL NAVIGATION Right 01/17/2021  Procedure: APPLICATION OF CRANIAL NAVIGATION;  Surgeon: Vallarie Mare, MD;  Location: [place];  Service: Neurosurgery;  Laterality: Right;   COLONOSCOPY  09/03/1994   CYSTOSCOPY WITH STENT PLACEMENT Right 01/25/2016   Procedure: CYSTOSCOPY WITH STENT PLACEMENT;  Surgeon: Nickie Retort, MD;  Location: [facility];  Service: Urology;  Laterality: Right;   FRAMELESS  BIOPSY WITH BRAINLAB Right 01/17/2021   Procedure:  RIGHT STEREOTACTIC BIOPSY OF INSULAR LESION;  Surgeon: Vallarie Mare, MD;  Location: [place];  Service: Neurosurgery;  Laterality: Right;   KNEE ARTHROSCOPY W/ MENISCAL REPAIR Right 06/23/1988   removal of birthmark  06/08/2013   SKIN CANCER EXCISION  06/23/2012   TYMPANOSTOMY TUBE PLACEMENT  08/06/1978   URETEROSCOPY WITH HOLMIUM LASER LITHOTRIPSY Right 01/25/2016   Procedure: URETEROSCOPY WITH HOLMIUM LASER LITHOTRIPSY;  Surgeon: Nickie Retort, MD;  Location: [facility];  Service: Urology;  Laterality: Right;   URETHRAL STRICTURE DILATATION     visual inspection of vocal cord     1973   WISDOM TOOTH EXTRACTION     Social History:  Social History   Socioeconomic History   Marital status: Married    Spouse name: Not on file   Number of children: Not on file   Years of education: Not on file   Highest education level: Not on file  Occupational History   Not on file  Tobacco Use   Smoking status: Never   Smokeless tobacco: Never  Vaping Use   Vaping Use: Never used  Substance and Sexual Activity   Alcohol use: Not Currently    Comment: rare   Drug use: No   Sexual activity: Not on file  Other Topics Concern   Not on file  Social History Narrative   Not on file   Social Determinants of Health   Financial Resource Strain: Not on file  Food Insecurity: Not on file  Transportation Needs: Not on file  Physical Activity: Not on file  Stress: Not on file  Social Connections: Not on file  Intimate Partner Violence: Not on file   Family History:  Family History  Problem Relation Age of Onset   Hyperlipidemia Father    Hypertension Father    Diabetes Father    Benign prostatic hyperplasia Father    Stroke Maternal Grandmother    Diabetes Maternal Grandmother    Stroke Paternal Grandmother    Hypertension Paternal Grandmother    Kidney disease Neg Hx    Prostate cancer Neg Hx     Review of Systems: Constitutional: Doesn't report fevers, chills or abnormal  weight loss Eyes: Doesn't report blurriness of vision Ears, nose, mouth, throat, and face: Doesn't report sore throat Respiratory: Doesn't report cough, dyspnea or wheezes Cardiovascular: Doesn't report palpitation, chest discomfort  Gastrointestinal:  Doesn't report nausea, constipation, diarrhea GU: Doesn't report incontinence Skin: Doesn't report skin rashes Neurological: Per HPI Musculoskeletal: Doesn't report joint pain Behavioral/Psych: Doesn't report anxiety  Physical Exam: Vitals:   09/01/22 1027  BP: 133/86  Pulse: 70  Resp: 18  Temp: (!) 97 F (36.1 C)  SpO2: 100%   KPS: 90. General: Fatigued Head: Normal EENT: No conjunctival injection or scleral icterus.  Lungs: Resp effort normal Cardiac: Regular rate Abdomen: Non-distended abdomen Skin: No rashes cyanosis or petechiae. Extremities: No clubbing or edema  Neurologic Exam: Mental Status: Awake, alert, attentive to examiner. Oriented to self and environment. Language is fluent with intact comprehension.  Modest psychomotor slowing, impaired 3 object recall Cranial Nerves: Visual acuity is  grossly normal. Visual fields are full. Extra-ocular movements intact. No ptosis. Face is symmetric Motor: Tone and bulk are normal. Power is full in both arms and legs. Reflexes are symmetric, no pathologic reflexes present.  Sensory: Intact to light touch Gait: Normal.   Labs: I have reviewed the data as listed    Component Value Date/Time   NA 138 08/01/2022 0940   NA 138 05/04/2012 1815   K 4.7 08/01/2022 0940   K 3.5 05/04/2012 1815   CL 100 08/01/2022 0940   CL 99 05/04/2012 1815   CO2 29 08/01/2022 0940   CO2 29 05/04/2012 1815   GLUCOSE 246 (H) 08/01/2022 0940   GLUCOSE 69 05/04/2012 1815   BUN 12 08/01/2022 0940   BUN 12 05/04/2012 1815   CREATININE 0.86 08/01/2022 0940   CALCIUM 9.3 08/01/2022 0940   CALCIUM 9.2 05/04/2012 1815   PROT 6.9 08/01/2022 0940   PROT 8.5 (H) 05/04/2012 1815   ALBUMIN 4.5  03/11/2022 1130   ALBUMIN 4.5 05/04/2012 1815   AST 14 08/01/2022 0940   AST 20 03/11/2022 1130   ALT 23 08/01/2022 0940   ALT 38 03/11/2022 1130   ALT 77 05/04/2012 1815   ALKPHOS 107 03/11/2022 1130   ALKPHOS 129 05/04/2012 1815   BILITOT 0.6 08/01/2022 0940   BILITOT 0.7 03/11/2022 1130   GFRNONAA >60 03/11/2022 1130   GFRNONAA 106 12/05/2020 1014   GFRAA 123 12/05/2020 1014   Lab Results  Component Value Date   WBC 8.0 03/11/2022   NEUTROABS 5.8 03/11/2022   HGB 16.0 03/11/2022   HCT 44.7 03/11/2022   MCV 83.1 03/11/2022   PLT 235 03/11/2022   Imaging:  [place] Clinician Interpretation: I have personally reviewed the CNS images as listed.  My interpretation, in the context of the patient's clinical presentation, is treatment effect vs true progression  MR Brain W Wo Contrast  Result Date: 08/23/2022 CLINICAL DATA:  CNS neoplasm staging.  Oligodendroglioma. EXAM: MRI HEAD WITHOUT AND WITH CONTRAST TECHNIQUE: Multiplanar, multiecho pulse sequences of the brain and surrounding structures were obtained without and with intravenous contrast. CONTRAST:  36m GADAVIST GADOBUTROL 1 MMOL/ML IV SOLN COMPARISON:  03/08/2022 FINDINGS: Brain: Infiltrating glioma in the right cerebral hemisphere. Patchy enhancing areas often overlapping with restricted diffusion and T2 hyperintensity, seen around the temporal horn of the right lateral ventricle, in the anterior internal capsule on the right, adjacent to the frontal horn of the right lateral ventricle, and in the right anterior cingulate gyrus region. This latter area shows prominent growth since before, now 17 mm as compared to 4 mm previously. The largest area of enhancement wraps inferiorly around the temporal horn of the right lateral ventricle, measuring up to 2.5 cm and transverse span and becoming bulkier than before. There is an increase in T2 hyperintensity especially in the anterior right frontal white matter. Other dominant area of T2  hyperintensity in the right temporal lobe. Biopsy track in the anterior right frontal lobe. No acute hemorrhage, hydrocephalus, or collection. Vascular: Major flow voids and vascular enhancements are preserved Skull and upper cervical spine: Normal marrow signal Sinuses/Orbits: New generalized sinus opacification and mucosal thickening sparing the sphenoid sinuses. IMPRESSION: 1. Multifocal worsening of right hemispheric infiltrating glioma as described. 2. Interval, extensive bilateral sinusitis. Electronically Signed   By: JJorje GuildM.D.   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  MRI brain demonstrates findings consistent with progression of radiation necrosis, or progression of oligodendroglioma.  Some enhancing foci visualized in September have resolved sporadically, others have progressed.  We discussed case in tumor board, recommended shorter interval MRI brain.  He is agreeable with this.  Recommended trial of Lamictal as AED given tolerance issues with Keppra.  He did not consent to AED therapy recommended by [place].  Ok with Trazodone '50mg'$  HS for sleep issues.  We ask that ABY FRIEDLEY return to clinic in 2 months following next brain MRI, or sooner as needed.  All questions were answered. The patient knows to call the clinic with any problems, questions or concerns. No barriers to learning were detected.  The total time spent in the encounter was 40 minutes and more than 50% was on counseling and review of test results   Ventura Sellers, MD Medical Director of Neuro-Oncology [facility] at [place] 09/01/22 10:40 AM

## 2022-09-02 ENCOUNTER — Telehealth: Payer: Self-pay | Admitting: Internal Medicine

## 2022-09-02 NOTE — Telephone Encounter (Signed)
Per 3/11 LOS reached out to schedule patient. Left voicemail.

## 2022-09-08 ENCOUNTER — Encounter: Payer: Self-pay | Admitting: Internal Medicine

## 2022-09-09 MED ORDER — LAMOTRIGINE 100 MG PO TABS: 100.0000 mg | ORAL_TABLET | Freq: Every day | ORAL | 1 refills | Status: DC

## 2022-09-09 MED ORDER — LAMOTRIGINE 25 MG PO TABS: ORAL_TABLET | ORAL | 0 refills | Status: DC

## 2022-09-23 ENCOUNTER — Other Ambulatory Visit: Payer: Self-pay

## 2022-10-22 ENCOUNTER — Inpatient Hospital Stay
Admission: RE | Admit: 2022-10-22 | Discharge: 2022-10-22 | Disposition: A | Discharge status: Home / Self Care | Payer: Self-pay | Source: Ambulatory Visit | Attending: Internal Medicine | Admitting: Internal Medicine

## 2022-10-22 ENCOUNTER — Other Ambulatory Visit: Payer: Self-pay | Admitting: *Deleted

## 2022-10-22 DIAGNOSIS — C719 Malignant neoplasm of brain, unspecified: Secondary | ICD-10-CM

## 2022-10-25 ENCOUNTER — Other Ambulatory Visit: Payer: Self-pay

## 2022-10-27 ENCOUNTER — Inpatient Hospital Stay
Admission: RE | Admit: 2022-10-27 | Discharge: 2022-10-27 | Disposition: A | Discharge status: Home / Self Care | Payer: Self-pay | Source: Ambulatory Visit | Attending: Internal Medicine | Admitting: Internal Medicine

## 2022-10-27 ENCOUNTER — Other Ambulatory Visit: Payer: Self-pay | Admitting: *Deleted

## 2022-10-27 DIAGNOSIS — C719 Malignant neoplasm of brain, unspecified: Secondary | ICD-10-CM

## 2022-10-28 ENCOUNTER — Encounter: Payer: Self-pay | Admitting: Internal Medicine

## 2022-10-30 ENCOUNTER — Ambulatory Visit (HOSPITAL_COMMUNITY): Payer: BC Managed Care – PPO

## 2022-10-31 ENCOUNTER — Encounter: Payer: BC Managed Care – PPO | Admitting: Internal Medicine

## 2022-11-04 ENCOUNTER — Inpatient Hospital Stay: Payer: BC Managed Care – PPO | Attending: Internal Medicine | Admitting: Internal Medicine

## 2022-11-04 VITALS — BP 126/76 | HR 66 | Temp 97.2°F | Resp 17 | Wt 153.9 lb

## 2022-11-04 DIAGNOSIS — C711 Malignant neoplasm of frontal lobe: Secondary | ICD-10-CM | POA: Diagnosis present

## 2022-11-04 DIAGNOSIS — C719 Malignant neoplasm of brain, unspecified: Secondary | ICD-10-CM

## 2022-11-04 DIAGNOSIS — R569 Unspecified convulsions: Secondary | ICD-10-CM | POA: Diagnosis not present

## 2022-11-04 DIAGNOSIS — G4089 Other seizures: Secondary | ICD-10-CM | POA: Insufficient documentation

## 2022-11-04 NOTE — Progress Notes (Signed)
[facility] at [facility] [address]  [place], [place] [address] [phone]  Interval Evaluation  Date of Service: 11/04/22 Patient Name: Alan Wise Patient MRN: [id number] Patient DOB: 09/25/1970 Provider: Henreitta Leber, MD  Identifying Statement:  Alan Wise is a 52 y.o. 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 Maisie Fus; path demonstrates IDH-1 mutant oligodendroglioma   03/20/2021 - 04/30/2021 Radiation Therapy   Completes 54 Gy IMRT and Temodar with Dr. Basilio Cairo   06/02/2021 - 03/05/2022 Chemotherapy   Completes 8 cycles of adjuvant 5-day Temozolomide   02/01/2022 Progression   Progression of disease in multifocal/metastatic/lmd pattern   03/11/2022 Progression   Progression of disease confirmed after additional cycle of TMZ, 1 month follow up MRI.  LP/cytology and MRI spine mets screening unremarkable   04/10/2022 Surgery   Stereotactic biopsy of progressive R frontal lesion at [facility] with Dr. Zachery Conch.  Path demonstrates radiation necrosis     Biomarkers:   Interval History: Alan Wise presents today for follow up after recent MRI brain.  He doesn't describe any new or progressive neurologic deficits today.  No issues with gait, short term memory compared to prior.  Energy level and stress have improved since semester finalized earlier this month.  No further seizures, continues on Lamictal 100mg  daily.  H+P (12/31/20) Patient presented to medical attention this past month, with complaint of involuntary left sided shaking.  He describes episodes, occurring several times per week, of left arm shaking, lasting up to 5 minutes; following the episodes he describes the arm as "heavy" for some time before returning to normal.  He also describes episodes of sudden inability to speak, with strange movements of face and hands, lasting same  amount of time.  These episodes go back ~10 years in very sporadic nature, but have become much more frequent in recent months.  He works full time as a Agricultural consultant at [employer].  These episodes have occurred at work, and have been disruptive in that environment.  He is otherwise fully functional, independent.  Medications: Current Outpatient Medications on File Prior to Visit  Medication Sig Dispense Refill   lamoTRIgine (LAMICTAL) 100 MG tablet Take 1 tablet (100 mg total) by mouth daily. 60 tablet 1   atorvastatin (LIPITOR) 40 MG tablet TAKE 1 TABLET BY MOUTH EVERY DAY (Patient not taking: Reported on 08/01/2022) 90 tablet 3   FREESTYLE LITE test strip USE TWO TIMES DAILY TO TEST BLOOD SUGAR (Patient not taking: Reported on 08/01/2022) 50 each 11   glipiZIDE (GLUCOTROL) 10 MG tablet TAKE 1 TABLET BY MOUTH TWICE A DAY (Patient not taking: Reported on 11/04/2022) 180 tablet 0   JANUVIA 100 MG tablet TAKE 1 TABLET BY MOUTH EVERY DAY (Patient not taking: Reported on 11/04/2022) 90 tablet 0   No current facility-administered medications on file prior to visit.    Allergies:  Allergies  Allergen Reactions   Contrast Media [Iodinated Contrast Media] Swelling   Past Medical History:  Past Medical History:  Diagnosis Date   Allergy    Complication of anesthesia    pt reports he is starting to have more difficulty arousing after surgery   Diabetes mellitus ([facility])    GERD (gastroesophageal reflux disease)    Headache    History of kidney stones    Hyperlipidemia    Renal disorder    kidney stones   Past Surgical  History:  Past Surgical History:  Procedure Laterality Date   APPLICATION OF CRANIAL NAVIGATION Right 01/17/2021   Procedure: APPLICATION OF CRANIAL NAVIGATION;  Surgeon: Bedelia Person, MD;  Location: [facility];  Service: Neurosurgery;  Laterality: Right;   COLONOSCOPY  09/03/1994   CYSTOSCOPY WITH STENT PLACEMENT Right 01/25/2016   Procedure: CYSTOSCOPY WITH STENT  PLACEMENT;  Surgeon: Hildred Laser, MD;  Location: [facility];  Service: Urology;  Laterality: Right;   FRAMELESS  BIOPSY WITH BRAINLAB Right 01/17/2021   Procedure: RIGHT STEREOTACTIC BIOPSY OF INSULAR LESION;  Surgeon: Bedelia Person, MD;  Location[facility] OR;  Service: Neurosurgery;  Laterality: Right;   KNEE ARTHROSCOPY W/ MENISCAL REPAIR Right 06/23/1988   removal of birthmark  06/08/2013   SKIN CANCER EXCISION  06/23/2012   TYMPANOSTOMY TUBE PLACEMENT  08/06/1978   URETEROSCOPY WITH HOLMIUM LASER LITHOTRIPSY Right 01/25/2016   Procedure: URETEROSCOPY WITH HOLMIUM LASER LITHOTRIPSY;  Surgeon: Hildred Laser, MD;  Location: [facility];  Service: Urology;  Laterality: Right;   URETHRAL STRICTURE DILATATION     visual inspection of vocal cord     1973   WISDOM TOOTH EXTRACTION     Social History:  Social History   Socioeconomic History   Marital status: Married    Spouse name: Not on file   Number of children: Not on file   Years of education: Not on file   Highest education level: Not on file  Occupational History   Not on file  Tobacco Use   Smoking status: Never   Smokeless tobacco: Never  Vaping Use   Vaping Use: Never used  Substance and Sexual Activity   Alcohol use: Not Currently    Comment: rare   Drug use: No   Sexual activity: Not on file  Other Topics Concern   Not on file  Social History Narrative   Not on file   Social Determinants of Health   Financial Resource Strain: Not on file  Food Insecurity: Not on file  Transportation Needs: Not on file  Physical Activity: Not on file  Stress: Not on file  Social Connections: Not on file  Intimate Partner Violence: Not on file   Family History:  Family History  Problem Relation Age of Onset   Hyperlipidemia Father    Hypertension Father    Diabetes Father    Benign prostatic hyperplasia Father    Stroke Maternal Grandmother    Diabetes Maternal Grandmother    Stroke Paternal Grandmother     Hypertension Paternal Grandmother    Kidney disease Neg Hx    Prostate cancer Neg Hx     Review of Systems: Constitutional: Doesn't report fevers, chills or abnormal weight loss Eyes: Doesn't report blurriness of vision Ears, nose, mouth, throat, and face: Doesn't report sore throat Respiratory: Doesn't report cough, dyspnea or wheezes Cardiovascular: Doesn't report palpitation, chest discomfort  Gastrointestinal:  Doesn't report nausea, constipation, diarrhea GU: Doesn't report incontinence Skin: Doesn't report skin rashes Neurological: Per HPI Musculoskeletal: Doesn't report joint pain Behavioral/Psych: Doesn't report anxiety  Physical Exam: Vitals:   11/04/22 0856  BP: 126/76  Pulse: 66  Resp: 17  Temp: (!) 97.2 F (36.2 C)  SpO2: 99%   KPS: 90. General: Fatigued Head: Normal EENT: No conjunctival injection or scleral icterus.  Lungs: Resp effort normal Cardiac: Regular rate Abdomen: Non-distended abdomen Skin: No rashes cyanosis or petechiae. Extremities: No clubbing or edema  Neurologic Exam: Mental Status: Awake, alert, attentive to examiner. Oriented to self and environment.  Language is fluent with intact comprehension.  Modest psychomotor slowing, impaired 3 object recall Cranial Nerves: Visual acuity is grossly normal. Visual fields are full. Extra-ocular movements intact. No ptosis. Face is symmetric Motor: Tone and bulk are normal. Power is full in both arms and legs. Reflexes are symmetric, no pathologic reflexes present.  Sensory: Intact to light touch Gait: Normal.   Labs: I have reviewed the data as listed    Component Value Date/Time   NA 138 08/01/2022 0940   NA 138 05/04/2012 1815   K 4.7 08/01/2022 0940   K 3.5 05/04/2012 1815   CL 100 08/01/2022 0940   CL 99 05/04/2012 1815   CO2 29 08/01/2022 0940   CO2 29 05/04/2012 1815   GLUCOSE 246 (H) 08/01/2022 0940   GLUCOSE 69 05/04/2012 1815   BUN 12 08/01/2022 0940   BUN 12 05/04/2012 1815    CREATININE 0.86 08/01/2022 0940   CALCIUM 9.3 08/01/2022 0940   CALCIUM 9.2 05/04/2012 1815   PROT 6.9 08/01/2022 0940   PROT 8.5 (H) 05/04/2012 1815   ALBUMIN 4.5 03/11/2022 1130   ALBUMIN 4.5 05/04/2012 1815   AST 14 08/01/2022 0940   AST 20 03/11/2022 1130   ALT 23 08/01/2022 0940   ALT 38 03/11/2022 1130   ALT 77 05/04/2012 1815   ALKPHOS 107 03/11/2022 1130   ALKPHOS 129 05/04/2012 1815   BILITOT 0.6 08/01/2022 0940   BILITOT 0.7 03/11/2022 1130   GFRNONAA >60 03/11/2022 1130   GFRNONAA 106 12/05/2020 1014   GFRAA 123 12/05/2020 1014   Lab Results  Component Value Date   WBC 8.0 03/11/2022   NEUTROABS 5.8 03/11/2022   HGB 16.0 03/11/2022   HCT 44.7 03/11/2022   MCV 83.1 03/11/2022   PLT 235 03/11/2022   Imaging:  [facility] Clinician Interpretation: I have personally reviewed the CNS images as listed.  My interpretation, in the context of the patient's clinical presentation, is progressive disease  MRI BRAIN WITHOUT AND WITH CONTRAST   INDICATION: Astrocytoma/oligodendroglioma, monitor, oligodendroglioma eval  for tumor progression or new lesions comparing to previous, D49.6 Neoplasm  of unspecified behavior of brain (CMS/HHS-HCC)   ADDITIONAL CLINICAL HISTORY: None   BASELINE COMPARISON: MR 04/08/2022  ADDITIONAL COMPARISON(S): 03/08/2022   TECHNIQUE/PROTOCOL: Adult brain tumor protocol without and with IV  contrast.   FINDINGS:  Prior right frontal approach biopsy. Surrounding increased signal on T2  weighted imaging is significantly increased throughout the right cerebral  hemisphere now involving large areas of the superior right frontal lobe  external capsule, and temporal lobe. There is no involvement of the genu  and body of the corpus callosum. There is significantly interval increased  size of the right paramedian frontal and right corona radiata lesions.  Paramedian right frontal lobe now measures 1.8 x 1.7 cm. Right corona  radiata lesion now measures  1.4 x 1.0 cm. Previously both measured under a  centimeter. The lesion adjacent to the right occipital horn measures 1.8 x  1.7 cm, previously 1.1 x 1.2 cm. persistent enhancement adjacent to the  right frontal horn, which is not significantly changed. 2 subcentimeter  enhancing lesions in the right anterior temporal lobe are no longer  visible.  Other Brain Parenchyma:  No hemorrhage or acute cortical infarction.  Worsening sulcal effacement throughout the right frontal lobe however no  midline shift.  Ventricles: No hydrocephalus.  Extra-Axial Spaces: No extra-axial fluid collection.  Basal Cisterns: The basal cisterns are maintained.  Intracranial Flow-Voids: Normal.   Paranasal  Sinuses: Severe bilateral maxillary mucosal thickening.  Mastoid air cells: Normal.  Orbits: Normal.   IMPRESSION:   Significant interval increase in size of multiple right frontal and  temporal lobe irregular enhancing lesions with significantly increased  surrounding T2/FLAIR signal abnormality. Two small enhancing lesions in the  right anterior temporal lobe have resolved. Given mixed change and  irregular appearance of enhancing lesions, some component of evolving  postradiation change is considered. Recommend correlation with radiotherapy  history and timing. Brain tumor follow-up score 3b: Imaging worsening,  indeterminate    Electronically Reviewed by:  Otilio Saber, MD, [facility]  Electronically Reviewed on:  10/21/2022 10:34 AM    Assessment/Plan Oligodendroglioma ([facility])  Focal seizures ([facility])  Leeanne Mannan is clinically stable today, now ~7 months removed from diagnostic right periventricular biopsy at [facility] which demonstrated radiation necrosis.    MRI brain again demonstrates findings consistent with progression of radiation necrosis, or progression of oligodendroglioma.  Right frontal mass is particularly concerning for progressive tumor, given linear growth over >6  months.  We discussed case in tumor board, recommended stereotactic biopsy of R frontal mass.  He is agreeable with this, we will reach out to Dr. Santiago Glad team for evaluation.  Recommended continuing Lamictal 100mg  daily.  We ask that Alan Wise return to clinic following biopsy with Dr. Zachery Conch, or sooner as needed.  All questions were answered. The patient knows to call the clinic with any problems, questions or concerns. No barriers to learning were detected.  The total time spent in the encounter was 40 minutes and more than 50% was on counseling and review of test results   Henreitta Leber, MD Medical Director of Neuro-Oncology [facility] at [place] 11/04/22 9:02 AM

## 2022-11-13 ENCOUNTER — Other Ambulatory Visit: Payer: Self-pay | Admitting: Internal Medicine

## 2022-11-13 NOTE — Telephone Encounter (Signed)
Requested medication (s) are due for refill today:   Not sure.   Pt states he is not taking it 11/04/2022 in OV with Dr. Barbaraann Cao, Oncology  Requested medication (s) are on the active medication list:   Yes  Future visit scheduled:   Yes tomorrow 5/24 with Rene Kocher at 10:20   Last ordered: 08/06/2022 #180, 0 refills  Returned for review during OV tomorrow since he is stating he is not taking it.      Requested Prescriptions  Pending Prescriptions Disp Refills   glipiZIDE (GLUCOTROL) 10 MG tablet [Pharmacy Med Name: GLIPIZIDE 10 MG TABLET] 180 tablet 0    Sig: TAKE 1 TABLET BY MOUTH TWICE A DAY     Endocrinology:  Diabetes - Sulfonylureas Failed - 11/13/2022  2:20 AM      Failed - HBA1C is between 0 and 7.9 and within 180 days    Hemoglobin A1C  Date Value Ref Range Status  01/29/2022 9.1 (A) 4.0 - 5.6 % Final   Hgb A1c MFr Bld  Date Value Ref Range Status  10/21/2021 8.2 (H) <5.7 % of total Hgb Final    Comment:    For someone without known diabetes, a hemoglobin A1c value of 6.5% or greater indicates that they may have  diabetes and this should be confirmed with a follow-up  test. . For someone with known diabetes, a value <7% indicates  that their diabetes is well controlled and a value  greater than or equal to 7% indicates suboptimal  control. A1c targets should be individualized based on  duration of diabetes, age, comorbid conditions, and  other considerations. . Currently, no consensus exists regarding use of hemoglobin A1c for diagnosis of diabetes for children. .          Passed - Cr in normal range and within 360 days    Creat  Date Value Ref Range Status  08/01/2022 0.86 0.70 - 1.30 mg/dL Final   Creatinine,U  Date Value Ref Range Status  04/24/2020 113.1 mg/dL Final   Creatinine, Urine  Date Value Ref Range Status  10/21/2021 140 20 - 320 mg/dL Final         Passed - Valid encounter within last 6 months    Recent Outpatient Visits           3 months  ago Type 2 diabetes mellitus with hyperglycemia, without long-term current use of insulin [facility])   [place] [facility] [place], [place] W, NP   6 months ago Type 2 diabetes mellitus with hyperglycemia, without long-term current use of insulin [facility])   [place] [facility] [place], [place] W, NP   9 months ago Type 2 diabetes mellitus with hyperglycemia, without long-term current use of insulin [facility])   [place] [facility] [place], Salvadore Oxford, NP   1 year ago Encounter for general adult medical examination with abnormal findings   [place] [facility] [place], Salvadore Oxford, NP   1 year ago [place] [facility])   [place] [facility] [place], Salvadore Oxford, NP       Future Appointments             Tomorrow Sampson Si, Salvadore Oxford, NP [place] [facility], [facility]

## 2022-11-14 ENCOUNTER — Ambulatory Visit (INDEPENDENT_AMBULATORY_CARE_PROVIDER_SITE_OTHER): Payer: BC Managed Care – PPO | Admitting: Internal Medicine

## 2022-11-14 ENCOUNTER — Encounter: Payer: Self-pay | Admitting: Internal Medicine

## 2022-11-14 ENCOUNTER — Other Ambulatory Visit: Payer: Self-pay | Admitting: Internal Medicine

## 2022-11-14 VITALS — BP 124/78 | HR 64 | Temp 96.6°F | Ht 68.5 in | Wt 153.0 lb

## 2022-11-14 DIAGNOSIS — Z125 Encounter for screening for malignant neoplasm of prostate: Secondary | ICD-10-CM | POA: Diagnosis not present

## 2022-11-14 DIAGNOSIS — E1165 Type 2 diabetes mellitus with hyperglycemia: Secondary | ICD-10-CM | POA: Diagnosis not present

## 2022-11-14 DIAGNOSIS — Z0001 Encounter for general adult medical examination with abnormal findings: Secondary | ICD-10-CM

## 2022-11-14 DIAGNOSIS — Z1211 Encounter for screening for malignant neoplasm of colon: Secondary | ICD-10-CM

## 2022-11-14 NOTE — Patient Instructions (Signed)
Health Maintenance, Male Adopting a healthy lifestyle and getting preventive care are important in promoting health and wellness. Ask your health care provider about: The right schedule for you to have regular tests and exams. Things you can do on your own to prevent diseases and keep yourself healthy. What should I know about diet, weight, and exercise? Eat a healthy diet  Eat a diet that includes plenty of vegetables, fruits, low-fat dairy products, and lean protein. Do not eat a lot of foods that are high in solid fats, added sugars, or sodium. Maintain a healthy weight Body mass index (BMI) is a measurement that can be used to identify possible weight problems. It estimates body fat based on height and weight. Your health care provider can help determine your BMI and help you achieve or maintain a healthy weight. Get regular exercise Get regular exercise. This is one of the most important things you can do for your health. Most adults should: Exercise for at least 150 minutes each week. The exercise should increase your heart rate and make you sweat (moderate-intensity exercise). Do strengthening exercises at least twice a week. This is in addition to the moderate-intensity exercise. Spend less time sitting. Even light physical activity can be beneficial. Watch cholesterol and blood lipids Have your blood tested for lipids and cholesterol at 51 years of age, then have this test every 5 years. You may need to have your cholesterol levels checked more often if: Your lipid or cholesterol levels are high. You are older than 51 years of age. You are at high risk for heart disease. What should I know about cancer screening? Many types of cancers can be detected early and may often be prevented. Depending on your health history and family history, you may need to have cancer screening at various ages. This may include screening for: Colorectal cancer. Prostate cancer. Skin cancer. Lung  cancer. What should I know about heart disease, diabetes, and high blood pressure? Blood pressure and heart disease High blood pressure causes heart disease and increases the risk of stroke. This is more likely to develop in people who have high blood pressure readings or are overweight. Talk with your health care provider about your target blood pressure readings. Have your blood pressure checked: Every 3-5 years if you are 18-39 years of age. Every year if you are 40 years old or older. If you are between the ages of 65 and 75 and are a current or former smoker, ask your health care provider if you should have a one-time screening for abdominal aortic aneurysm (AAA). Diabetes Have regular diabetes screenings. This checks your fasting blood sugar level. Have the screening done: Once every three years after age 45 if you are at a normal weight and have a low risk for diabetes. More often and at a younger age if you are overweight or have a high risk for diabetes. What should I know about preventing infection? Hepatitis B If you have a higher risk for hepatitis B, you should be screened for this virus. Talk with your health care provider to find out if you are at risk for hepatitis B infection. Hepatitis C Blood testing is recommended for: Everyone born from 1945 through 1965. Anyone with known risk factors for hepatitis C. Sexually transmitted infections (STIs) You should be screened each year for STIs, including gonorrhea and chlamydia, if: You are sexually active and are younger than 51 years of age. You are older than 51 years of age and your   health care provider tells you that you are at risk for this type of infection. Your sexual activity has changed since you were last screened, and you are at increased risk for chlamydia or gonorrhea. Ask your health care provider if you are at risk. Ask your health care provider about whether you are at high risk for HIV. Your health care provider  may recommend a prescription medicine to help prevent HIV infection. If you choose to take medicine to prevent HIV, you should first get tested for HIV. You should then be tested every 3 months for as long as you are taking the medicine. Follow these instructions at home: Alcohol use Do not drink alcohol if your health care provider tells you not to drink. If you drink alcohol: Limit how much you have to 0-2 drinks a day. Know how much alcohol is in your drink. In the [place] one drink equals one 12 oz bottle of beer (355 mL), one 5 oz glass of wine (148 mL), or one 1 oz glass of hard liquor (44 mL). Lifestyle Do not use any products that contain nicotine or tobacco. These products include cigarettes, chewing tobacco, and vaping devices, such as e-cigarettes. If you need help quitting, ask your health care provider. Do not use street drugs. Do not share needles. Ask your health care provider for help if you need support or information about quitting drugs. General instructions Schedule regular health, dental, and eye exams. Stay current with your vaccines. Tell your health care provider if: You often feel depressed. You have ever been abused or do not feel safe at home. Summary Adopting a healthy lifestyle and getting preventive care are important in promoting health and wellness. Follow your health care provider's instructions about healthy diet, exercising, and getting tested or screened for diseases. Follow your health care provider's instructions on monitoring your cholesterol and blood pressure. This information is not intended to replace advice given to you by your health care provider. Make sure you discuss any questions you have with your health care provider. Document Revised: 10/29/2020 Document Reviewed: 10/29/2020 Elsevier Patient Education  2024 [employer]vier Inc.  

## 2022-11-14 NOTE — Telephone Encounter (Signed)
Requested Prescriptions  Pending Prescriptions Disp Refills   JANUVIA 100 MG tablet [Pharmacy Med Name: JANUVIA 100 MG TABLET] 90 tablet 0    Sig: TAKE 1 TABLET BY MOUTH EVERY DAY     Endocrinology:  Diabetes - DPP-4 Inhibitors Failed - 11/14/2022  2:30 AM      Failed - HBA1C is between 0 and 7.9 and within 180 days    Hemoglobin A1C  Date Value Ref Range Status  01/29/2022 9.1 (A) 4.0 - 5.6 % Final   Hgb A1c MFr Bld  Date Value Ref Range Status  10/21/2021 8.2 (H) <5.7 % of total Hgb Final    Comment:    For someone without known diabetes, a hemoglobin A1c value of 6.5% or greater indicates that they may have  diabetes and this should be confirmed with a follow-up  test. . For someone with known diabetes, a value <7% indicates  that their diabetes is well controlled and a value  greater than or equal to 7% indicates suboptimal  control. A1c targets should be individualized based on  duration of diabetes, age, comorbid conditions, and  other considerations. . Currently, no consensus exists regarding use of hemoglobin A1c for diagnosis of diabetes for children. .          Passed - Cr in normal range and within 360 days    Creat  Date Value Ref Range Status  08/01/2022 0.86 0.70 - 1.30 mg/dL Final   Creatinine,U  Date Value Ref Range Status  04/24/2020 113.1 mg/dL Final   Creatinine, Urine  Date Value Ref Range Status  10/21/2021 140 20 - 320 mg/dL Final         Passed - Valid encounter within last 6 months    Recent Outpatient Visits           3 months ago Type 2 diabetes mellitus with hyperglycemia, without long-term current use of insulin [facility])   [place] [facility] [place], [place] W, NP   6 months ago Type 2 diabetes mellitus with hyperglycemia, without long-term current use of insulin [facility])   [place] [facility] [place], [place] W, NP   9 months ago Type 2 diabetes mellitus with hyperglycemia, without long-term current use  of insulin [facility])   [place] [facility] [place], Salvadore [place], NP   1 year ago Encounter for general adult medical examination with abnormal findings   [place] [facility] [place], Salvadore Oxford, NP   1 year ago [place] [facility])   [place] [facility] [place], Salvadore Oxford, NP       Future Appointments             Today Sampson Si, Salvadore Oxford, NP [place] [facility], [facility]

## 2022-11-14 NOTE — Progress Notes (Signed)
Subjective:    Patient ID: Alan Wise, male    DOB: [birth year], 52 y.o.   MRN: [id number]  HPI  Patient presents to clinic today for his annual exam.  He admits that he is not taking any of his prescribed medications.  Flu: Never Tetanus: >10 years ago COVID: Janssen x 1 Shingrix: Never PSA screening: Never Colon screening: Never Vision screening: as needed Dentist: as needed  Diet: He does eat meat. He consumes fruits and veggies. He does eat some fried foods. He drinks mostly soda and water. Exercise: Walking  Review of Systems     Past Medical History:  Diagnosis Date   Allergy    Complication of anesthesia    pt reports he is starting to have more difficulty arousing after surgery   Diabetes mellitus (HCC)    GERD (gastroesophageal reflux disease)    Headache    History of kidney stones    Hyperlipidemia    Renal disorder    kidney stones    Current Outpatient Medications  Medication Sig Dispense Refill   atorvastatin (LIPITOR) 40 MG tablet TAKE 1 TABLET BY MOUTH EVERY DAY (Patient not taking: Reported on 08/01/2022) 90 tablet 3   FREESTYLE LITE test strip USE TWO TIMES DAILY TO TEST BLOOD SUGAR (Patient not taking: Reported on 08/01/2022) 50 each 11   glipiZIDE (GLUCOTROL) 10 MG tablet TAKE 1 TABLET BY MOUTH TWICE A DAY 180 tablet 0   JANUVIA 100 MG tablet TAKE 1 TABLET BY MOUTH EVERY DAY (Patient not taking: Reported on 11/04/2022) 90 tablet 0   lamoTRIgine (LAMICTAL) 100 MG tablet Take 1 tablet (100 mg total) by mouth daily. 60 tablet 1   No current facility-administered medications for this visit.    Allergies  Allergen Reactions   Contrast Media [Iodinated Contrast Media] Swelling    Family History  Problem Relation Age of Onset   Hyperlipidemia Father    Hypertension Father    Diabetes Father    Benign prostatic hyperplasia Father    Stroke Maternal Grandmother    Diabetes Maternal Grandmother    Stroke Paternal Grandmother    Hypertension  Paternal Grandmother    Kidney disease Neg Hx    Prostate cancer Neg Hx     Social History   Socioeconomic History   Marital status: Married    Spouse name: Not on file   Number of children: Not on file   Years of education: Not on file   Highest education level: Not on file  Occupational History   Not on file  Tobacco Use   Smoking status: Never   Smokeless tobacco: Never  Vaping Use   Vaping Use: Never used  Substance and Sexual Activity   Alcohol use: Not Currently    Comment: rare   Drug use: No   Sexual activity: Not on file  Other Topics Concern   Not on file  Social History Narrative   Not on file   Social Determinants of Health   Financial Resource Strain: Not on file  Food Insecurity: Not on file  Transportation Needs: Not on file  Physical Activity: Not on file  Stress: Not on file  Social Connections: Not on file  Intimate Partner Violence: Not on file     Constitutional: Denies fever, malaise, fatigue, headache or abrupt weight changes.  HEENT: Denies eye pain, eye redness, ear pain, ringing in the ears, wax buildup, runny nose, nasal congestion, bloody nose, or sore throat. Respiratory: Denies difficulty breathing, shortness  of breath, cough or sputum production.   Cardiovascular: Denies chest pain, chest tightness, palpitations or swelling in the hands or feet.  Gastrointestinal: Denies abdominal pain, bloating, constipation, diarrhea or blood in the stool.  GU: Denies urgency, frequency, pain with urination, burning sensation, blood in urine, odor or discharge. Musculoskeletal: Denies decrease in range of motion, difficulty with gait, muscle pain or joint pain and swelling.  Skin: Denies redness, rashes, lesions or ulcercations.  Neurological: Patient reports some expressive aphasia.  Denies dizziness, difficulty with memory, difficulty with speech or problems with balance and coordination.  Psych: Denies anxiety, depression, SI/HI.  No other  specific complaints in a complete review of systems (except as listed in HPI above).  Objective:   Physical Exam  BP 124/78 (BP Location: Right Arm, Patient Position: Sitting, Cuff Size: Normal)   Pulse 64   Temp (!) 96.6 F (35.9 C) (Temporal)   Ht 5' 8.5" (1.74 m)   Wt 153 lb (69.4 kg)   SpO2 99%   BMI 22.93 kg/m   Wt Readings from Last 3 Encounters:  11/04/22 153 lb 14.4 oz (69.8 kg)  09/01/22 150 lb 12.8 oz (68.4 kg)  08/01/22 158 lb (71.7 kg)    General: Appears his stated age, well developed, well nourished in NAD. Skin: Warm, dry and intact. No ulcerations noted. HEENT: Head: normal shape and size; Eyes: sclera white, no icterus, conjunctiva pink, PERRLA and EOMs intact;  Neck:  Neck supple, trachea midline. No masses, lumps or thyromegaly present.  Cardiovascular: Normal rate and rhythm. S1,S2 noted.  No murmur, rubs or gallops noted. No JVD or BLE edema. No carotid bruits noted. Pulmonary/Chest: Normal effort and positive vesicular breath sounds. No respiratory distress. No wheezes, rales or ronchi noted.  Abdomen: Normal bowel sounds. Musculoskeletal: Strength 5/5 BUE/BLE.  No difficulty with gait.  Neurological: Alert and oriented.  He does have some difficulty finding his words.  Cranial nerves II-XII grossly intact. Coordination normal.  Psychiatric: Mood and affect normal. Behavior is normal. Judgment and thought content normal.    BMET    Component Value Date/Time   NA 138 08/01/2022 0940   NA 138 05/04/2012 1815   K 4.7 08/01/2022 0940   K 3.5 05/04/2012 1815   CL 100 08/01/2022 0940   CL 99 05/04/2012 1815   CO2 29 08/01/2022 0940   CO2 29 05/04/2012 1815   GLUCOSE 246 (H) 08/01/2022 0940   GLUCOSE 69 05/04/2012 1815   BUN 12 08/01/2022 0940   BUN 12 05/04/2012 1815   CREATININE 0.86 08/01/2022 0940   CALCIUM 9.3 08/01/2022 0940   CALCIUM 9.2 05/04/2012 1815   GFRNONAA >60 03/11/2022 1130   GFRNONAA 106 12/05/2020 1014   GFRAA 123 12/05/2020 1014     Lipid Panel     Component Value Date/Time   CHOL 204 (H) 08/01/2022 0940   TRIG 211 (H) 08/01/2022 0940   HDL 30 (L) 08/01/2022 0940   CHOLHDL 6.8 (H) 08/01/2022 0940   VLDL 60.6 (H) 08/30/2020 1506   LDLCALC 139 (H) 08/01/2022 0940    CBC    Component Value Date/Time   WBC 8.0 03/11/2022 1130   WBC 6.5 10/21/2021 1457   RBC 5.38 03/11/2022 1130   HGB 16.0 03/11/2022 1130   HGB 16.4 05/04/2012 1815   HCT 44.7 03/11/2022 1130   HCT 48.4 05/04/2012 1815   PLT 235 03/11/2022 1130   PLT 261 05/04/2012 1815   MCV 83.1 03/11/2022 1130   MCV 86 05/04/2012 1815  MCH 29.7 03/11/2022 1130   MCHC 35.8 03/11/2022 1130   RDW 12.2 03/11/2022 1130   RDW 12.7 05/04/2012 1815   LYMPHSABS 1.0 03/11/2022 1130   MONOABS 0.5 03/11/2022 1130   EOSABS 0.6 (H) 03/11/2022 1130   BASOSABS 0.1 03/11/2022 1130    Hgb A1C Lab Results  Component Value Date   HGBA1C 9.1 (A) 01/29/2022           Assessment & Plan:   Preventative Health Maintenance:  Encouraged him to get a flu shot in the fall He declines tetanus vaccine Encouraged him to get his COVID booster Discussed Shingrix vaccine, he will check coverage with his insurance company and schedule a visit if he would like to have this done Referral to for screening colonoscopy Encouraged him to consume a balanced diet and exercise regimen Advised him to see an eye doctor and dentist annually We will check CBC, c-Met, lipid, A1c and PSA today  RTC in 3 months, follow-up chronic conditions Nicki Reaper, NP

## 2022-11-15 ENCOUNTER — Encounter: Payer: Self-pay | Admitting: Internal Medicine

## 2022-11-15 ENCOUNTER — Other Ambulatory Visit: Payer: Self-pay

## 2022-11-15 LAB — COMPLETE METABOLIC PANEL WITH GFR
AG Ratio: 1.9 (calc) (ref 1.0–2.5)
ALT: 23 U/L (ref 9–46)
AST: 16 U/L (ref 10–35)
Albumin: 4.6 g/dL (ref 3.6–5.1)
Alkaline phosphatase (APISO): 103 U/L (ref 35–144)
BUN: 14 mg/dL (ref 7–25)
CO2: 29 mmol/L (ref 20–32)
Calcium: 9.6 mg/dL (ref 8.6–10.3)
Chloride: 100 mmol/L (ref 98–110)
Creat: 0.9 mg/dL (ref 0.70–1.30)
Globulin: 2.4 g/dL (calc) (ref 1.9–3.7)
Glucose, Bld: 168 mg/dL — ABNORMAL HIGH (ref 65–99)
Potassium: 4.2 mmol/L (ref 3.5–5.3)
Sodium: 138 mmol/L (ref 135–146)
Total Bilirubin: 0.6 mg/dL (ref 0.2–1.2)
Total Protein: 7 g/dL (ref 6.1–8.1)
eGFR: 104 mL/min/{1.73_m2} (ref >=60)

## 2022-11-15 LAB — HEMOGLOBIN A1C
Hgb A1c MFr Bld: 9 % of total Hgb — ABNORMAL HIGH (ref <5.7)
Mean Plasma Glucose: 212 mg/dL
eAG (mmol/L): 11.7 mmol/L

## 2022-11-15 LAB — CBC
HCT: 44.9 % (ref 38.5–50.0)
Hemoglobin: 15.3 g/dL (ref 13.2–17.1)
MCH: 28.7 pg (ref 27.0–33.0)
MCHC: 34.1 g/dL (ref 32.0–36.0)
MCV: 84.1 fL (ref 80.0–100.0)
MPV: 9.1 fL (ref 7.5–12.5)
Platelets: 210 10*3/uL (ref 140–400)
RBC: 5.34 10*6/uL (ref 4.20–5.80)
RDW: 13 % (ref 11.0–15.0)
WBC: 5.5 10*3/uL (ref 3.8–10.8)

## 2022-11-15 LAB — LIPID PANEL
Cholesterol: 190 mg/dL (ref <200)
HDL: 33 mg/dL — ABNORMAL LOW (ref >=40)
LDL Cholesterol (Calc): 134 mg/dL (calc) — ABNORMAL HIGH
Non-HDL Cholesterol (Calc): 157 mg/dL (calc) — ABNORMAL HIGH (ref <130)
Total CHOL/HDL Ratio: 5.8 (calc) — ABNORMAL HIGH (ref <5.0)
Triglycerides: 122 mg/dL (ref <150)

## 2022-11-15 LAB — PSA: PSA: 0.49 ng/mL (ref <=4.00)

## 2022-11-15 LAB — MICROALBUMIN / CREATININE URINE RATIO
Creatinine, Urine: 241 mg/dL (ref 20–320)
Microalb Creat Ratio: 4 mg/g creat (ref <30)
Microalb, Ur: 1 mg/dL

## 2022-11-21 ENCOUNTER — Other Ambulatory Visit: Payer: Self-pay

## 2022-11-21 ENCOUNTER — Telehealth: Payer: Self-pay

## 2022-11-21 DIAGNOSIS — Z1211 Encounter for screening for malignant neoplasm of colon: Secondary | ICD-10-CM

## 2022-11-21 MED ORDER — NA SULFATE-K SULFATE-MG SULF 17.5-3.13-1.6 GM/177ML PO SOLN: 1.0000 | Freq: Once | ORAL | 0 refills | Status: AC

## 2022-11-21 NOTE — Telephone Encounter (Signed)
Gastroenterology Pre-Procedure Review  Request Date: 12/17/22 Requesting Physician: Dr. Servando Snare  PATIENT REVIEW QUESTIONS: The patient responded to the following health history questions as indicated:    1. Are you having any GI issues? no 2. Do you have a personal history of Polyps? no 3. Do you have a family history of Colon Cancer or Polyps? YES mom polyps 4. Diabetes Mellitus? Yes has been advised to stop Glipizide 1 day prior to colonoscopy.  Stop januvia 2 days prior to colonoscopy. 5. Joint replacements in the past 12 months?no 6. Major health problems in the past 3 months?no 7. Any artificial heart valves, MVP, or defibrillator?no    MEDICATIONS & ALLERGIES:    Patient reports the following regarding taking any anticoagulation/antiplatelet therapy:   Plavix, Coumadin, Eliquis, Xarelto, Lovenox, Pradaxa, Brilinta, or Effient? no Aspirin? no  Patient confirms/reports the following medications:  Current Outpatient Medications  Medication Sig Dispense Refill   atorvastatin (LIPITOR) 40 MG tablet TAKE 1 TABLET BY MOUTH EVERY DAY (Patient not taking: Reported on 08/01/2022) 90 tablet 3   FREESTYLE LITE test strip USE TWO TIMES DAILY TO TEST BLOOD SUGAR (Patient not taking: Reported on 08/01/2022) 50 each 11   glipiZIDE (GLUCOTROL) 10 MG tablet TAKE 1 TABLET BY MOUTH TWICE A DAY 180 tablet 0   JANUVIA 100 MG tablet TAKE 1 TABLET BY MOUTH EVERY DAY 90 tablet 0   lamoTRIgine (LAMICTAL) 100 MG tablet Take 1 tablet (100 mg total) by mouth daily. 60 tablet 1   No current facility-administered medications for this visit.    Patient confirms/reports the following allergies:  Allergies  Allergen Reactions   Contrast Media [Iodinated Contrast Media] Swelling    No orders of the defined types were placed in this encounter.   AUTHORIZATION INFORMATION Primary Insurance: 1D#: Group #:  Secondary Insurance: 1D#: Group #:  SCHEDULE INFORMATION: Date:12/17/22  Time: Location: [facility]

## 2022-12-09 ENCOUNTER — Telehealth: Payer: Self-pay | Admitting: *Deleted

## 2022-12-09 NOTE — Telephone Encounter (Signed)
Patient was transfer from endo unit because he needed to reschedule his colonoscopy.  Will be rescheduling to 01/07/2023. Patient already have prep solution.  Called endo unit to make the change.  New instructions will be sent.   Patient verbalized understanding.

## 2022-12-29 ENCOUNTER — Other Ambulatory Visit: Payer: Self-pay

## 2022-12-29 ENCOUNTER — Inpatient Hospital Stay
Admission: RE | Admit: 2022-12-29 | Discharge: 2022-12-29 | Disposition: A | Discharge status: Home / Self Care | Payer: Self-pay | Source: Ambulatory Visit | Attending: Internal Medicine | Admitting: Internal Medicine

## 2022-12-29 ENCOUNTER — Inpatient Hospital Stay: Payer: BC Managed Care – PPO | Attending: Internal Medicine | Admitting: Internal Medicine

## 2022-12-29 ENCOUNTER — Other Ambulatory Visit: Payer: Self-pay | Admitting: *Deleted

## 2022-12-29 VITALS — BP 128/82 | HR 67 | Temp 97.5°F | Resp 20 | Wt 153.8 lb

## 2022-12-29 DIAGNOSIS — E119 Type 2 diabetes mellitus without complications: Secondary | ICD-10-CM | POA: Diagnosis not present

## 2022-12-29 DIAGNOSIS — K219 Gastro-esophageal reflux disease without esophagitis: Secondary | ICD-10-CM | POA: Insufficient documentation

## 2022-12-29 DIAGNOSIS — Z923 Personal history of irradiation: Secondary | ICD-10-CM | POA: Diagnosis not present

## 2022-12-29 DIAGNOSIS — Z9221 Personal history of antineoplastic chemotherapy: Secondary | ICD-10-CM | POA: Diagnosis not present

## 2022-12-29 DIAGNOSIS — Z79899 Other long term (current) drug therapy: Secondary | ICD-10-CM | POA: Insufficient documentation

## 2022-12-29 DIAGNOSIS — C711 Malignant neoplasm of frontal lobe: Secondary | ICD-10-CM | POA: Diagnosis present

## 2022-12-29 DIAGNOSIS — E785 Hyperlipidemia, unspecified: Secondary | ICD-10-CM | POA: Diagnosis not present

## 2022-12-29 DIAGNOSIS — C719 Malignant neoplasm of brain, unspecified: Secondary | ICD-10-CM

## 2022-12-29 DIAGNOSIS — Z87442 Personal history of urinary calculi: Secondary | ICD-10-CM | POA: Insufficient documentation

## 2022-12-29 DIAGNOSIS — Z7984 Long term (current) use of oral hypoglycemic drugs: Secondary | ICD-10-CM | POA: Diagnosis not present

## 2022-12-29 NOTE — Progress Notes (Signed)
[facility] at [facility] [address]  [place], [place] [address] [phone]  Interval Evaluation  Date of Service: 12/29/22 Patient Name: Alan Wise Patient MRN: [id number] Patient DOB: [birth year] Provider: Henreitta Leber, MD  Identifying Statement:  Alan Wise is a 52 y.o. 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 Maisie Fus; path demonstrates IDH-1 mutant oligodendroglioma   03/20/2021 - 04/30/2021 Radiation Therapy   Completes 54 Gy IMRT and Temodar with Dr. Basilio Cairo   06/02/2021 - 03/05/2022 Chemotherapy   Completes 8 cycles of adjuvant 5-day Temozolomide   02/01/2022 Progression   Progression of disease in multifocal/metastatic/lmd pattern   03/11/2022 Progression   Progression of disease confirmed after additional cycle of TMZ, 1 month follow up MRI.  LP/cytology and MRI spine mets screening unremarkable   04/10/2022 Surgery   Stereotactic biopsy of progressive R frontal lesion at [facility] with Dr. Zachery Conch.  Path demonstrates radiation necrosis     Biomarkers:   Interval History: Alan Wise presents today for follow up after recent MRI brain.  He doesn't describe any new or progressive neurologic deficits today.  No issues with gait, short term memory compared to prior.  Energy level and stress have improved since semester finalized earlier this month.  No further seizures, continues on Lamictal 100mg  daily.  H+P (12/31/20) Patient presented to medical attention this past month, with complaint of involuntary left sided shaking.  He describes episodes, occurring several times per week, of left arm shaking, lasting up to 5 minutes; following the episodes he describes the arm as "heavy" for some time before returning to normal.  He also describes episodes of sudden inability to speak, with strange movements of face and hands, lasting same  amount of time.  These episodes go back ~10 years in very sporadic nature, but have become much more frequent in recent months.  He works full time as a Agricultural consultant at [employer].  These episodes have occurred at work, and have been disruptive in that environment.  He is otherwise fully functional, independent.  Medications: Current Outpatient Medications on File Prior to Visit  Medication Sig Dispense Refill   atorvastatin (LIPITOR) 40 MG tablet TAKE 1 TABLET BY MOUTH EVERY DAY (Patient not taking: Reported on 08/01/2022) 90 tablet 3   FREESTYLE LITE test strip USE TWO TIMES DAILY TO TEST BLOOD SUGAR (Patient not taking: Reported on 08/01/2022) 50 each 11   glipiZIDE (GLUCOTROL) 10 MG tablet TAKE 1 TABLET BY MOUTH TWICE A DAY (Patient not taking: Reported on 12/29/2022) 180 tablet 0   JANUVIA 100 MG tablet TAKE 1 TABLET BY MOUTH EVERY DAY (Patient not taking: Reported on 12/29/2022) 90 tablet 0   lamoTRIgine (LAMICTAL) 100 MG tablet Take 1 tablet (100 mg total) by mouth daily. 60 tablet 1   No current facility-administered medications on file prior to visit.    Allergies:  Allergies  Allergen Reactions   Contrast Media [Iodinated Contrast Media] Swelling   Past Medical History:  Past Medical History:  Diagnosis Date   Allergy    Complication of anesthesia    pt reports he is starting to have more difficulty arousing after surgery   Diabetes mellitus (HCC)    GERD (gastroesophageal reflux disease)    Headache    History of kidney stones    Hyperlipidemia    Renal disorder    kidney stones   Past Surgical  History:  Past Surgical History:  Procedure Laterality Date   APPLICATION OF CRANIAL NAVIGATION Right 01/17/2021   Procedure: APPLICATION OF CRANIAL NAVIGATION;  Surgeon: Bedelia Person, MD;  [facility];  Service: Neurosurgery;  Laterality: Right;   COLONOSCOPY  09/03/1994   CYSTOSCOPY WITH STENT PLACEMENT Right 01/25/2016   Procedure: CYSTOSCOPY WITH STENT PLACEMENT;   Surgeon: Hildred Laser, MD;  Location: [facility] ORS;  Service: Urology;  Laterality: Right;   FRAMELESS  BIOPSY WITH BRAINLAB Right 01/17/2021   Procedure: RIGHT STEREOTACTIC BIOPSY OF INSULAR LESION;  Surgeon: Bedelia Person, MD;  Location: [facility] OR;  Service: Neurosurgery;  Laterality: Right;   KNEE ARTHROSCOPY W/ MENISCAL REPAIR Right 06/23/1988   removal of birthmark  06/08/2013   SKIN CANCER EXCISION  06/23/2012   TYMPANOSTOMY TUBE PLACEMENT  08/06/1978   URETEROSCOPY WITH HOLMIUM LASER LITHOTRIPSY Right 01/25/2016   Procedure: URETEROSCOPY WITH HOLMIUM LASER LITHOTRIPSY;  Surgeon: Hildred Laser, MD;  Location: [facility] ORS;  Service: Urology;  Laterality: Right;   URETHRAL STRICTURE DILATATION     visual inspection of vocal cord     1973   WISDOM TOOTH EXTRACTION     Social History:  Social History   Socioeconomic History   Marital status: Married    Spouse name: Not on file   Number of children: Not on file   Years of education: Not on file   Highest education level: Not on file  Occupational History   Not on file  Tobacco Use   Smoking status: Never   Smokeless tobacco: Never  Vaping Use   Vaping Use: Never used  Substance and Sexual Activity   Alcohol use: Not Currently    Comment: rare   Drug use: No   Sexual activity: Not on file  Other Topics Concern   Not on file  Social History Narrative   Not on file   Social Determinants of Health   Financial Resource Strain: Not on file  Food Insecurity: Not on file  Transportation Needs: Not on file  Physical Activity: Not on file  Stress: Not on file  Social Connections: Not on file  Intimate Partner Violence: Not on file   Family History:  Family History  Problem Relation Age of Onset   Hyperlipidemia Father    Hypertension Father    Diabetes Father    Benign prostatic hyperplasia Father    Stroke Maternal Grandmother    Diabetes Maternal Grandmother    Stroke Paternal Grandmother    Hypertension  Paternal Grandmother    Kidney disease Neg Hx    Prostate cancer Neg Hx     Review of Systems: Constitutional: Doesn't report fevers, chills or abnormal weight loss Eyes: Doesn't report blurriness of vision Ears, nose, mouth, throat, and face: Doesn't report sore throat Respiratory: Doesn't report cough, dyspnea or wheezes Cardiovascular: Doesn't report palpitation, chest discomfort  Gastrointestinal:  Doesn't report nausea, constipation, diarrhea GU: Doesn't report incontinence Skin: Doesn't report skin rashes Neurological: Per HPI Musculoskeletal: Doesn't report joint pain Behavioral/Psych: Doesn't report anxiety  Physical Exam: Vitals:   12/29/22 0931  BP: 128/82  Pulse: 67  Resp: 20  Temp: (!) 97.5 F (36.4 C)  SpO2: 98%   KPS: 90. General: Fatigued Head: Normal EENT: No conjunctival injection or scleral icterus.  Lungs: Resp effort normal Cardiac: Regular rate Abdomen: Non-distended abdomen Skin: No rashes cyanosis or petechiae. Extremities: No clubbing or edema  Neurologic Exam: Mental Status: Awake, alert, attentive to examiner. Oriented to self and environment.  Language is fluent with intact comprehension.  Modest psychomotor slowing, impaired 3 object recall Cranial Nerves: Visual acuity is grossly normal. Visual fields are full. Extra-ocular movements intact. No ptosis. Face is symmetric Motor: Tone and bulk are normal. Power is full in both arms and legs. Reflexes are symmetric, no pathologic reflexes present.  Sensory: Intact to light touch Gait: Normal.   Labs: I have reviewed the data as listed    Component Value Date/Time   NA 138 11/14/2022 1025   NA 138 05/04/2012 1815   K 4.2 11/14/2022 1025   K 3.5 05/04/2012 1815   CL 100 11/14/2022 1025   CL 99 05/04/2012 1815   CO2 29 11/14/2022 1025   CO2 29 05/04/2012 1815   GLUCOSE 168 (H) 11/14/2022 1025   GLUCOSE 69 05/04/2012 1815   BUN 14 11/14/2022 1025   BUN 12 05/04/2012 1815   CREATININE  0.90 11/14/2022 1025   CALCIUM 9.6 11/14/2022 1025   CALCIUM 9.2 05/04/2012 1815   PROT 7.0 11/14/2022 1025   PROT 8.5 (H) 05/04/2012 1815   ALBUMIN 4.5 03/11/2022 1130   ALBUMIN 4.5 05/04/2012 1815   AST 16 11/14/2022 1025   AST 20 03/11/2022 1130   ALT 23 11/14/2022 1025   ALT 38 03/11/2022 1130   ALT 77 05/04/2012 1815   ALKPHOS 107 03/11/2022 1130   ALKPHOS 129 05/04/2012 1815   BILITOT 0.6 11/14/2022 1025   BILITOT 0.7 03/11/2022 1130   GFRNONAA >60 03/11/2022 1130   GFRNONAA 106 12/05/2020 1014   GFRAA 123 12/05/2020 1014   Lab Results  Component Value Date   WBC 5.5 11/14/2022   NEUTROABS 5.8 03/11/2022   HGB 15.3 11/14/2022   HCT 44.9 11/14/2022   MCV 84.1 11/14/2022   PLT 210 11/14/2022   Imaging:  [facility] Clinician Interpretation: I have personally reviewed the CNS images as listed.  My interpretation, in the context of the patient's clinical presentation, is progressive disease  MRI BRAIN WITHOUT AND WITH CONTRAST   INDICATION: Astrocytoma/oligodendroglioma, monitor, oligodendroglioma eval  for tumor progression or new lesions comparing to previous, D49.6 Neoplasm  of unspecified behavior of brain (CMS/HHS-HCC)   ADDITIONAL CLINICAL HISTORY: None   BASELINE COMPARISON: MR 04/08/2022  ADDITIONAL COMPARISON(S): 03/08/2022   TECHNIQUE/PROTOCOL: Adult brain tumor protocol without and with IV  contrast.   FINDINGS:  Prior right frontal approach biopsy. Surrounding increased signal on T2  weighted imaging is significantly increased throughout the right cerebral  hemisphere now involving large areas of the superior right frontal lobe  external capsule, and temporal lobe. There is no involvement of the genu  and body of the corpus callosum. There is significantly interval increased  size of the right paramedian frontal and right corona radiata lesions.  Paramedian right frontal lobe now measures 1.8 x 1.7 cm. Right corona  radiata lesion now measures 1.4 x 1.0  cm. Previously both measured under a  centimeter. The lesion adjacent to the right occipital horn measures 1.8 x  1.7 cm, previously 1.1 x 1.2 cm. persistent enhancement adjacent to the  right frontal horn, which is not significantly changed. 2 subcentimeter  enhancing lesions in the right anterior temporal lobe are no longer  visible.  Other Brain Parenchyma:  No hemorrhage or acute cortical infarction.  Worsening sulcal effacement throughout the right frontal lobe however no  midline shift.  Ventricles: No hydrocephalus.  Extra-Axial Spaces: No extra-axial fluid collection.  Basal Cisterns: The basal cisterns are maintained.  Intracranial Flow-Voids: Normal.   Paranasal  Sinuses: Severe bilateral maxillary mucosal thickening.  Mastoid air cells: Normal.  Orbits: Normal.   IMPRESSION:   Significant interval increase in size of multiple right frontal and  temporal lobe irregular enhancing lesions with significantly increased  surrounding T2/FLAIR signal abnormality. Two small enhancing lesions in the  right anterior temporal lobe have resolved. Given mixed change and  irregular appearance of enhancing lesions, some component of evolving  postradiation change is considered. Recommend correlation with radiotherapy  history and timing. Brain tumor follow-up score 3b: Imaging worsening,  indeterminate    Electronically Reviewed by:  Otilio Saber, MD, [facility]  Electronically Reviewed on:  10/21/2022 10:34 AM   Pathology report from June 2024 biopsy: A-B. Brain tissue, right frontal lesion, biopsy:   Brain tissue with changes consistent with treatment effect; see comment.       Comment: There is no definitive morphologic, immunophenotypic, or molecular evidence of the patient's known oligodendroglioma, IDH-mutant and 1p/19q-codeleted; see comment. The specimen is negative for mutations in the TERT promoter and in IDH1/2 by Sanger sequencing.       Reviewed by  resident/fellow Bernadette Hoit, GREGORY  Electronically signed by Ulanda Edison, MD on 12/23/2022    Assessment/Plan Oligodendroglioma [facility]) - Plan: MR BRAIN W WO CONTRAST  Alan Wise is clinically stable today, now 1 months removed from second brain biopsy at [facility], which again demonstrated radiation necrosis.  This time progressive right frontal lesion was targeted.  We discussed options for intervention for radiation necrosis, including corticosteroids (poorly tolerated previously), avastin, continued imaging surveillance.  Fortunately, he is asymptomatic for the time being.     He would prefer to follow up with an additional MRI brain study later this month.  Would recommend this interval given progressive findings on last 2 imaging studies.  We will obtain electronic images from June 10th study at [facility] to compare.  Recommended continuing Lamictal 100mg  daily.  We ask that Alan Wise return to clinic next month following updated MRI study, or sooner as needed.  All questions were answered. The patient knows to call the clinic with any problems, questions or concerns. No barriers to learning were detected.  The total time spent in the encounter was 40 minutes and more than 50% was on counseling and review of test results   Henreitta Leber, MD Medical Director of Neuro-Oncology [facility] at [facility] 12/29/22 11:58 AM

## 2022-12-29 NOTE — Progress Notes (Signed)
Requested MRI Brain w wo contrast from [facility] 12/02/2022 to be pushed to [facility].

## 2022-12-30 ENCOUNTER — Telehealth: Payer: Self-pay | Admitting: Internal Medicine

## 2022-12-30 NOTE — Telephone Encounter (Signed)
Scheduled per 07/08 los, patient has been called and voicemail was left. 

## 2022-12-31 ENCOUNTER — Encounter: Payer: Self-pay | Admitting: Gastroenterology

## 2023-01-06 ENCOUNTER — Encounter: Payer: Self-pay | Admitting: Gastroenterology

## 2023-01-07 ENCOUNTER — Ambulatory Visit
Admission: RE | Admit: 2023-01-07 | Discharge: 2023-01-07 | Disposition: A | Discharge status: Home / Self Care | Payer: BC Managed Care – PPO | Attending: Gastroenterology | Admitting: Gastroenterology

## 2023-01-07 ENCOUNTER — Ambulatory Visit: Payer: BC Managed Care – PPO | Admitting: Anesthesiology

## 2023-01-07 ENCOUNTER — Encounter
Admission: RE | Disposition: A | Discharge status: Home / Self Care | Payer: Self-pay | Source: Home / Self Care | Attending: Gastroenterology

## 2023-01-07 DIAGNOSIS — Z1211 Encounter for screening for malignant neoplasm of colon: Secondary | ICD-10-CM | POA: Diagnosis not present

## 2023-01-07 DIAGNOSIS — E119 Type 2 diabetes mellitus without complications: Secondary | ICD-10-CM | POA: Insufficient documentation

## 2023-01-07 DIAGNOSIS — R569 Unspecified convulsions: Secondary | ICD-10-CM | POA: Diagnosis not present

## 2023-01-07 DIAGNOSIS — D123 Benign neoplasm of transverse colon: Secondary | ICD-10-CM | POA: Insufficient documentation

## 2023-01-07 DIAGNOSIS — K635 Polyp of colon: Secondary | ICD-10-CM

## 2023-01-07 HISTORY — PX: COLONOSCOPY WITH PROPOFOL: SHX5780

## 2023-01-07 HISTORY — PX: POLYPECTOMY: SHX5525

## 2023-01-07 LAB — GLUCOSE, CAPILLARY: Glucose-Capillary: 180 mg/dL — ABNORMAL HIGH (ref 70–99)

## 2023-01-07 SURGERY — COLONOSCOPY WITH PROPOFOL
Anesthesia: General

## 2023-01-07 MED ORDER — LIDOCAINE HCL (PF) 2 % IJ SOLN
INTRAMUSCULAR | Status: DC | PRN
  Administered 2023-01-07: 40 mg via INTRADERMAL

## 2023-01-07 MED ORDER — PROPOFOL 10 MG/ML IV BOLUS
INTRAVENOUS | Status: DC | PRN
  Administered 2023-01-07 (×5): 20 mg via INTRAVENOUS
  Administered 2023-01-07: 40 mg via INTRAVENOUS
  Administered 2023-01-07: 20 mg via INTRAVENOUS
  Administered 2023-01-07: 50 mg via INTRAVENOUS
  Administered 2023-01-07: 30 mg via INTRAVENOUS
  Administered 2023-01-07: 20 mg via INTRAVENOUS

## 2023-01-07 MED ORDER — SODIUM CHLORIDE 0.9 % IV SOLN
INTRAVENOUS | Status: DC
  Administered 2023-01-07: 20 mL/h via INTRAVENOUS

## 2023-01-07 NOTE — Anesthesia Preprocedure Evaluation (Signed)
Anesthesia Evaluation  Patient identified by MRN, date of birth, ID band Patient awake    Reviewed: Allergy & Precautions, NPO status , Patient's Chart, lab work & pertinent test results  History of Anesthesia Complications Negative for: history of anesthetic complications  Airway Mallampati: II  TM Distance: >3 FB Neck ROM: Full    Dental no notable dental hx. (+) Teeth Intact   Pulmonary neg pulmonary ROS, neg sleep apnea, neg COPD, Patient abstained from smoking.Not current smoker   Pulmonary exam normal breath sounds clear to auscultation       Cardiovascular Exercise Tolerance: Good METS(-) hypertension(-) CAD and (-) Past MI negative cardio ROS (-) dysrhythmias  Rhythm:Regular Rate:Normal - Systolic murmurs    Neuro/Psych  Headaches, Seizures -,  S/p brain tumor excision. No mass effect symptoms  negative psych ROS   GI/Hepatic ,GERD  ,,(+)     (-) substance abuse    Endo/Other  diabetes    Renal/GU negative Renal ROS     Musculoskeletal   Abdominal   Peds  Hematology   Anesthesia Other Findings Past Medical History: No date: Allergy No date: Complication of anesthesia     Comment:  pt reports he is starting to have more difficulty               arousing after surgery No date: Diabetes mellitus (HCC) No date: GERD (gastroesophageal reflux disease) No date: Headache No date: History of kidney stones No date: Hyperlipidemia No date: Renal disorder     Comment:  kidney stones  Reproductive/Obstetrics                             Anesthesia Physical Anesthesia Plan  ASA: 2  Anesthesia Plan: General   Post-op Pain Management: Minimal or no pain anticipated   Induction: Intravenous  PONV Risk Score and Plan: 2 and Propofol infusion, TIVA and Ondansetron  Airway Management Planned: Nasal Cannula  Additional Equipment: None  Intra-op Plan:   Post-operative Plan:    Informed Consent: I have reviewed the patients History and Physical, chart, labs and discussed the procedure including the risks, benefits and alternatives for the proposed anesthesia with the patient or authorized representative who has indicated his/her understanding and acceptance.     Dental advisory given  Plan Discussed with: CRNA and Surgeon  Anesthesia Plan Comments: (Discussed risks of anesthesia with patient, including possibility of difficulty with spontaneous ventilation under anesthesia necessitating airway intervention, PONV, and rare risks such as cardiac or respiratory or neurological events, and allergic reactions. Discussed the role of CRNA in patient's perioperative care. Patient understands.)       Anesthesia Quick Evaluation

## 2023-01-07 NOTE — H&P (Signed)
Alan Minium, MD [facility] [address], [address] [place], [place] [address] Phone: [phone] Fax : [phone]  Primary Care Physician:  Lorre Munroe, NP Primary Gastroenterologist:  Dr. Servando Snare  Pre-Procedure History & Physical: HPI:  Alan Wise is a 52 y.o. male is here for a screening colonoscopy.   Past Medical History:  Diagnosis Date   Allergy    Complication of anesthesia    pt reports he is starting to have more difficulty arousing after surgery   Diabetes mellitus (HCC)    GERD (gastroesophageal reflux disease)    Headache    History of kidney stones    Hyperlipidemia    Renal disorder    kidney stones    Past Surgical History:  Procedure Laterality Date   APPLICATION OF CRANIAL NAVIGATION Right 01/17/2021   Procedure: APPLICATION OF CRANIAL NAVIGATION;  Surgeon: Bedelia Person, MD;  Location: [facility] OR;  Service: Neurosurgery;  Laterality: Right;   COLONOSCOPY  09/03/1994   CYSTOSCOPY WITH STENT PLACEMENT Right 01/25/2016   Procedure: CYSTOSCOPY WITH STENT PLACEMENT;  Surgeon: Hildred Laser, MD;  Location: [facility] ORS;  Service: Urology;  Laterality: Right;   FRAMELESS  BIOPSY WITH BRAINLAB Right 01/17/2021   Procedure: RIGHT STEREOTACTIC BIOPSY OF INSULAR LESION;  Surgeon: Bedelia Person, MD;  Location: [facility] OR;  Service: Neurosurgery;  Laterality: Right;   KNEE ARTHROSCOPY W/ MENISCAL REPAIR Right 06/23/1988   removal of birthmark  06/08/2013   SKIN CANCER EXCISION  06/23/2012   TYMPANOSTOMY TUBE PLACEMENT  08/06/1978   URETEROSCOPY WITH HOLMIUM LASER LITHOTRIPSY Right 01/25/2016   Procedure: URETEROSCOPY WITH HOLMIUM LASER LITHOTRIPSY;  Surgeon: Hildred Laser, MD;  Location: [facility] ORS;  Service: Urology;  Laterality: Right;   URETHRAL STRICTURE DILATATION     visual inspection of vocal cord     1973   WISDOM TOOTH EXTRACTION      Prior to Admission medications   Medication Sig Start Date End Date Taking? Authorizing Provider  lamoTRIgine  (LAMICTAL) 100 MG tablet Take 1 tablet (100 mg total) by mouth daily. 09/23/22  Yes Vaslow, Georgeanna Lea, MD  atorvastatin (LIPITOR) 40 MG tablet TAKE 1 TABLET BY MOUTH EVERY DAY Patient not taking: Reported on 08/01/2022 05/05/22   Lorre Munroe, NP  FREESTYLE LITE test strip USE TWO TIMES DAILY TO TEST BLOOD SUGAR Patient not taking: Reported on 08/01/2022 11/26/15   Lorre Munroe, NP  glipiZIDE (GLUCOTROL) 10 MG tablet TAKE 1 TABLET BY MOUTH TWICE A DAY Patient not taking: Reported on 12/29/2022 11/13/22   Lorre Munroe, NP  JANUVIA 100 MG tablet TAKE 1 TABLET BY MOUTH EVERY DAY Patient not taking: Reported on 12/29/2022 11/14/22   Lorre Munroe, NP    Allergies as of 11/21/2022 - Review Complete 11/21/2022  Allergen Reaction Noted   Contrast media [iodinated contrast media] Swelling 01/17/2021    Family History  Problem Relation Age of Onset   Hyperlipidemia Father    Hypertension Father    Diabetes Father    Benign prostatic hyperplasia Father    Stroke Maternal Grandmother    Diabetes Maternal Grandmother    Stroke Paternal Grandmother    Hypertension Paternal Grandmother    Kidney disease Neg Hx    Prostate cancer Neg Hx     Social History   Socioeconomic History   Marital status: Married    Spouse name: Not on file   Number of children: Not on file   Years of education: Not on file  Highest education level: Not on file  Occupational History   Not on file  Tobacco Use   Smoking status: Never   Smokeless tobacco: Never  Vaping Use   Vaping status: Never Used  Substance and Sexual Activity   Alcohol use: Not Currently    Comment: rare   Drug use: No   Sexual activity: Not on file  Other Topics Concern   Not on file  Social History Narrative   Not on file   Social Determinants of Health   Financial Resource Strain: Low Risk  (12/05/2022)   Received from [facility], [facility]   Overall Financial Resource Strain (CARDIA)     Difficulty of Paying Living Expenses: Not hard at all  Food Insecurity: No Food Insecurity (12/05/2022)   Received from [facility], [facility]   Hunger Vital Sign    Worried About Running Out of Food in the Last Year: Never true    Ran Out of Food in the Last Year: Never true  Transportation Needs: No Transportation Needs (12/05/2022)   Received from [facility], [facility]   [facility] - Transportation    In the past 12 months, has lack of transportation kept you from medical appointments or from getting medications?: No    Lack of Transportation (Non-Medical): No  Physical Activity: Not on file  Stress: Not on file  Social Connections: Not on file  Intimate Partner Violence: Not on file    Review of Systems: See HPI, otherwise negative ROS  Physical Exam: BP 128/82   Pulse 78   Temp (!) 96.2 F (35.7 C) (Temporal)   Resp 20   Ht 5' 8.5" (1.74 m)   Wt 68.3 kg   SpO2 98%   BMI 22.57 kg/m  General:   Alert,  pleasant and cooperative in NAD Head:  Normocephalic and atraumatic. Neck:  Supple; no masses or thyromegaly. Lungs:  Clear throughout to auscultation.    Heart:  Regular rate and rhythm. Abdomen:  Soft, nontender and nondistended. Normal bowel sounds, without guarding, and without rebound.   Neurologic:  Alert and  oriented x4;  grossly normal neurologically.  Impression/Plan: [name] is now here to undergo a screening colonoscopy.  Risks, benefits, and alternatives regarding colonoscopy have been reviewed with the patient.  Questions have been answered.  All parties agreeable.

## 2023-01-07 NOTE — Op Note (Signed)
[facility] Patient Name: Alan Wise Procedure Date: 01/07/2023 9:46 AM MRN: [id number] Account #: [id number] Date of Birth: 1970-12-08 Admit Type: Outpatient Age: 52 Room: [facility] ENDO ROOM 4 Gender: Male Note Status: Finalized Instrument Name: Prentice Docker [id number] Procedure:             Colonoscopy Indications:           Screening for colorectal malignant neoplasm Providers:             [name] MD, MD Referring MD:          [name] MD, MD (Referring MD), Lorre Munroe                         (Referring MD) Medicines:             Propofol per Anesthesia Complications:         No immediate complications. Procedure:             Pre-Anesthesia Assessment:                        - Prior to the procedure, a History and Physical was                         performed, and patient medications and allergies were                         reviewed. The patient's tolerance of previous                         anesthesia was also reviewed. The risks and benefits                         of the procedure and the sedation options and risks                         were discussed with the patient. All questions were                         answered, and informed consent was obtained. Prior                         Anticoagulants: The patient has taken no anticoagulant                         or antiplatelet agents except for aspirin. ASA Grade                         Assessment: II - A patient with mild systemic disease.                         After reviewing the risks and benefits, the patient                         was deemed in satisfactory condition to undergo the                         procedure.  After obtaining informed consent, the colonoscope was                         passed under direct vision. Throughout the procedure,                         the patient's blood pressure, pulse, and oxygen                          saturations were monitored continuously. The                         Colonoscope was introduced through the anus and                         advanced to the the cecum, identified by appendiceal                         orifice and ileocecal valve. The colonoscopy was                         performed without difficulty. The patient tolerated                         the procedure well. The quality of the bowel                         preparation was excellent. Findings:      The perianal and digital rectal examinations were normal.      Two sessile polyps were found in the transverse colon. The polyps were 4       to 5 mm in size. These polyps were removed with a cold snare. Resection       and retrieval were complete. Impression:            - Two 4 to 5 mm polyps in the transverse colon,                         removed with a cold snare. Resected and retrieved. Recommendation:        - Discharge patient to home.                        - Resume previous diet.                        - Continue present medications.                        - Await pathology results.                        - If the pathology report reveals adenomatous tissue,                         then repeat the colonoscopy for surveillance in 8                         years. Procedure Code(s):     --- Professional ---  16109, Colonoscopy, flexible; with removal of                         tumor(s), polyp(s), or other lesion(s) by snare                         technique Diagnosis Code(s):     --- Professional ---                        Z12.11, Encounter for screening for malignant neoplasm                         of colon                        D12.3, Benign neoplasm of transverse colon (hepatic                         flexure or splenic flexure) CPT copyright 2022 American Medical Association. All rights reserved. The codes documented in this report are preliminary and upon coder review may  be  revised to meet current compliance requirements. [name] MD, MD 01/07/2023 10:11:49 AM This report has been signed electronically. Number of Addenda: 0 Note Initiated On: 01/07/2023 9:46 AM Scope Withdrawal Time: 0 hours 10 minutes 37 seconds  Total Procedure Duration: 0 hours 15 minutes 30 seconds  Estimated Blood Loss:  Estimated blood loss: none.      [facility]

## 2023-01-07 NOTE — Anesthesia Postprocedure Evaluation (Signed)
Anesthesia Post Note  Patient: Alan Wise  Procedure(s) Performed: COLONOSCOPY WITH PROPOFOL POLYPECTOMY  Patient location during evaluation: Endoscopy Anesthesia Type: General Level of consciousness: awake and alert Pain management: pain level controlled Vital Signs Assessment: post-procedure vital signs reviewed and stable Respiratory status: spontaneous breathing, nonlabored ventilation, respiratory function stable and patient connected to nasal cannula oxygen Cardiovascular status: blood pressure returned to baseline and stable Postop Assessment: no apparent nausea or vomiting Anesthetic complications: no   No notable events documented.   Last Vitals:  Vitals:   01/07/23 1022 01/07/23 1032  BP: 122/77 115/77  Pulse: 77 67  Resp: 18 15  Temp: (!) 35.7 C (!) 35.7 C  SpO2: 99% 99%    Last Pain:  Vitals:   01/07/23 1032  TempSrc: Temporal  PainSc: 0-No pain                 Corinda Gubler

## 2023-01-07 NOTE — Transfer of Care (Signed)
Immediate Anesthesia Transfer of Care Note  Patient: CLABORN JANUSZ  Procedure(s) Performed: COLONOSCOPY WITH PROPOFOL POLYPECTOMY  Patient Location: PACU and Endoscopy Unit  Anesthesia Type:MAC  Level of Consciousness: drowsy  Airway & Oxygen Therapy: Patient Spontanous Breathing and Patient connected to nasal cannula oxygen  Post-op Assessment: Report given to RN and Post -op Vital signs reviewed and stable  Post vital signs: Reviewed and stable  Last Vitals:  Vitals Value Taken Time  BP 109/72 01/07/23 1012  Temp 35.7 C 01/07/23 1012  Pulse 72 01/07/23 1013  Resp 12 01/07/23 1013  SpO2 99 % 01/07/23 1013  Vitals shown include unfiled device data.  Last Pain:  Vitals:   01/07/23 1012  TempSrc: Temporal  PainSc: Asleep         Complications: No notable events documented.

## 2023-01-08 ENCOUNTER — Encounter: Payer: Self-pay | Admitting: Gastroenterology

## 2023-01-09 ENCOUNTER — Ambulatory Visit (HOSPITAL_COMMUNITY)
Admission: RE | Admit: 2023-01-09 | Discharge: 2023-01-09 | Disposition: A | Discharge status: Home / Self Care | Payer: BC Managed Care – PPO | Source: Ambulatory Visit | Attending: Internal Medicine | Admitting: Internal Medicine

## 2023-01-09 DIAGNOSIS — C719 Malignant neoplasm of brain, unspecified: Secondary | ICD-10-CM | POA: Diagnosis not present

## 2023-01-09 MED ORDER — GADOBUTROL 1 MMOL/ML IV SOLN
7.0000 mL | Freq: Once | INTRAVENOUS | Status: AC | PRN
  Administered 2023-01-09: 7 mL via INTRAVENOUS

## 2023-01-10 ENCOUNTER — Encounter: Payer: Self-pay | Admitting: Gastroenterology

## 2023-01-12 LAB — HM DIABETES EYE EXAM

## 2023-01-13 ENCOUNTER — Inpatient Hospital Stay (HOSPITAL_BASED_OUTPATIENT_CLINIC_OR_DEPARTMENT_OTHER): Payer: BC Managed Care – PPO | Admitting: Internal Medicine

## 2023-01-13 ENCOUNTER — Other Ambulatory Visit: Payer: Self-pay

## 2023-01-13 VITALS — BP 142/75 | HR 62 | Temp 98.2°F | Resp 15 | Ht 68.5 in | Wt 154.8 lb

## 2023-01-13 DIAGNOSIS — R569 Unspecified convulsions: Secondary | ICD-10-CM | POA: Diagnosis not present

## 2023-01-13 DIAGNOSIS — Y842 Radiological procedure and radiotherapy as the cause of abnormal reaction of the patient, or of later complication, without mention of misadventure at the time of the procedure: Secondary | ICD-10-CM | POA: Insufficient documentation

## 2023-01-13 DIAGNOSIS — C719 Malignant neoplasm of brain, unspecified: Secondary | ICD-10-CM

## 2023-01-13 DIAGNOSIS — C711 Malignant neoplasm of frontal lobe: Secondary | ICD-10-CM | POA: Diagnosis not present

## 2023-01-13 DIAGNOSIS — I6789 Other cerebrovascular disease: Secondary | ICD-10-CM | POA: Diagnosis not present

## 2023-01-13 NOTE — Progress Notes (Signed)
[facility] [address]  [place], [place] [address] [phone]  Interval Evaluation  Date of Service: 01/13/23 Patient Name: Alan Wise Patient MRN: [id number] Patient DOB: 1970-10-26 Provider: Henreitta Leber, MD  Identifying Statement:  Alan Wise is a 52 y.o. 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 Maisie Fus; path demonstrates IDH-1 mutant oligodendroglioma   03/20/2021 - 04/30/2021 Radiation Therapy   Completes 54 Gy IMRT and Temodar with Dr. Basilio Cairo   06/02/2021 - 03/05/2022 Chemotherapy   Completes 8 cycles of adjuvant 5-day Temozolomide   02/01/2022 Progression   Progression of disease in multifocal/metastatic/lmd pattern   03/11/2022 Progression   Progression of disease confirmed after additional cycle of TMZ, 1 month follow up MRI.  LP/cytology and MRI spine mets screening unremarkable   04/10/2022 Surgery   Stereotactic biopsy of progressive R frontal lesion at [facility] with Dr. Zachery Conch.  Path demonstrates radiation necrosis     Biomarkers:   Interval History: Alan Wise presents today for follow up after recent MRI brain.  He feels stable or unchanged compared to last month.  No issues with gait, short term memory compared to prior.  Fall semester staring shortly.  No further seizures, continues on Lamictal 100mg  daily.  H+P (12/31/20) Patient presented to medical attention this past month, with complaint of involuntary left sided shaking.  He describes episodes, occurring several times per week, of left arm shaking, lasting up to 5 minutes; following the episodes he describes the arm as "heavy" for some time before returning to normal.  He also describes episodes of sudden inability to speak, with strange movements of face and hands, lasting same amount of time.  These episodes go back ~10 years in very sporadic nature,  but have become much more frequent in recent months.  He works full time as a Agricultural consultant at [employer].  These episodes have occurred at work, and have been disruptive in that environment.  He is otherwise fully functional, independent.  Medications: Current Outpatient Medications on File Prior to Visit  Medication Sig Dispense Refill   atorvastatin (LIPITOR) 40 MG tablet TAKE 1 TABLET BY MOUTH EVERY DAY (Patient not taking: Reported on 08/01/2022) 90 tablet 3   FREESTYLE LITE test strip USE TWO TIMES DAILY TO TEST BLOOD SUGAR (Patient not taking: Reported on 08/01/2022) 50 each 11   glipiZIDE (GLUCOTROL) 10 MG tablet TAKE 1 TABLET BY MOUTH TWICE A DAY (Patient not taking: Reported on 12/29/2022) 180 tablet 0   JANUVIA 100 MG tablet TAKE 1 TABLET BY MOUTH EVERY DAY (Patient not taking: Reported on 12/29/2022) 90 tablet 0   lamoTRIgine (LAMICTAL) 100 MG tablet Take 1 tablet (100 mg total) by mouth daily. 60 tablet 1   No current facility-administered medications on file prior to visit.    Allergies:  Allergies  Allergen Reactions   Contrast Media [Iodinated Contrast Media] Swelling   Past Medical History:  Past Medical History:  Diagnosis Date   Allergy    Complication of anesthesia    pt reports he is starting to have more difficulty arousing after surgery   Diabetes mellitus (HCC)    GERD (gastroesophageal reflux disease)    Headache    History of kidney stones    Hyperlipidemia    Renal disorder    kidney stones   Past Surgical History:  Past Surgical History:  Procedure Laterality Date  APPLICATION OF CRANIAL NAVIGATION Right 01/17/2021   Procedure: APPLICATION OF CRANIAL NAVIGATION;  Surgeon: Bedelia Person, MD;  Location: [facility] OR;  Service: Neurosurgery;  Laterality: Right;   COLONOSCOPY  09/03/1994   COLONOSCOPY WITH PROPOFOL N/A 01/07/2023   Procedure: COLONOSCOPY WITH PROPOFOL;  Surgeon: [name], MD;  Location: [facility] ENDOSCOPY;  Service: Endoscopy;  Laterality:  N/A;  NOT TOO EARLY   CYSTOSCOPY WITH STENT PLACEMENT Right 01/25/2016   Procedure: CYSTOSCOPY WITH STENT PLACEMENT;  Surgeon: Hildred Laser, MD;  Location: [facility] ORS;  Service: Urology;  Laterality: Right;   FRAMELESS  BIOPSY WITH BRAINLAB Right 01/17/2021   Procedure: RIGHT STEREOTACTIC BIOPSY OF INSULAR LESION;  Surgeon: Bedelia Person, MD;  Location: [facility] OR;  Service: Neurosurgery;  Laterality: Right;   KNEE ARTHROSCOPY W/ MENISCAL REPAIR Right 06/23/1988   POLYPECTOMY  01/07/2023   Procedure: POLYPECTOMY;  Surgeon: [name], MD;  Location: [facility] ENDOSCOPY;  Service: Endoscopy;;   removal of birthmark  06/08/2013   SKIN CANCER EXCISION  06/23/2012   TYMPANOSTOMY TUBE PLACEMENT  08/06/1978   URETEROSCOPY WITH HOLMIUM LASER LITHOTRIPSY Right 01/25/2016   Procedure: URETEROSCOPY WITH HOLMIUM LASER LITHOTRIPSY;  Surgeon: Hildred Laser, MD;  Location: [facility] ORS;  Service: Urology;  Laterality: Right;   URETHRAL STRICTURE DILATATION     visual inspection of vocal cord     1973   WISDOM TOOTH EXTRACTION     Social History:  Social History   Socioeconomic History   Marital status: Married    Spouse name: Not on file   Number of children: Not on file   Years of education: Not on file   Highest education level: Not on file  Occupational History   Not on file  Tobacco Use   Smoking status: Never   Smokeless tobacco: Never  Vaping Use   Vaping status: Never Used  Substance and Sexual Activity   Alcohol use: Not Currently    Comment: rare   Drug use: No   Sexual activity: Not on file  Other Topics Concern   Not on file  Social History Narrative   Not on file   Social Determinants of Health   Financial Resource Strain: Low Risk  (12/05/2022)   Received from [facility], [facility]   Overall Financial Resource Strain (CARDIA)    Difficulty of Paying Living Expenses: Not hard at all  Food Insecurity: No Food Insecurity (12/05/2022)    Received from [facility], [facility]   Hunger Vital Sign    Worried About Running Out of Food in the Last Year: Never true    Ran Out of Food in the Last Year: Never true  Transportation Needs: No Transportation Needs (12/05/2022)   Received from [name], [name]   [facility] - Transportation    In the past 12 months, has lack of transportation kept you from medical appointments or from getting medications?: No    Lack of Transportation (Non-Medical): No  Physical Activity: Not on file  Stress: Not on file  Social Connections: Not on file  Intimate Partner Violence: Not on file   Family History:  Family History  Problem Relation Age of Onset   Hyperlipidemia Father    Hypertension Father    Diabetes Father    Benign prostatic hyperplasia Father    Stroke Maternal Grandmother    Diabetes Maternal Grandmother    Stroke Paternal Grandmother    Hypertension Paternal  Grandmother    Kidney disease Neg Hx    Prostate cancer Neg Hx     Review of Systems: Constitutional: Doesn't report fevers, chills or abnormal weight loss Eyes: Doesn't report blurriness of vision Ears, nose, mouth, throat, and face: Doesn't report sore throat Respiratory: Doesn't report cough, dyspnea or wheezes Cardiovascular: Doesn't report palpitation, chest discomfort  Gastrointestinal:  Doesn't report nausea, constipation, diarrhea GU: Doesn't report incontinence Skin: Doesn't report skin rashes Neurological: Per HPI Musculoskeletal: Doesn't report joint pain Behavioral/Psych: Doesn't report anxiety  Physical Exam: Vitals:   01/13/23 0854  BP: (!) 142/75  Pulse: 62  Resp: 15  Temp: 98.2 F (36.8 C)  SpO2: 99%   KPS: 90. General: Fatigued Head: Normal EENT: No conjunctival injection or scleral icterus.  Lungs: Resp effort normal Cardiac: Regular rate Abdomen: Non-distended abdomen Skin: No rashes cyanosis or  petechiae. Extremities: No clubbing or edema  Neurologic Exam: Mental Status: Awake, alert, attentive to examiner. Oriented to self and environment. Language is fluent with intact comprehension.  Modest psychomotor slowing, impaired 3 object recall Cranial Nerves: Visual acuity is grossly normal. Visual fields are full. Extra-ocular movements intact. No ptosis. Face is symmetric Motor: Tone and bulk are normal. Power is full in both arms and legs. Reflexes are symmetric, no pathologic reflexes present.  Sensory: Intact to light touch Gait: Normal.   Labs: I have reviewed the data as listed    Component Value Date/Time   NA 138 11/14/2022 1025   NA 138 05/04/2012 1815   K 4.2 11/14/2022 1025   K 3.5 05/04/2012 1815   CL 100 11/14/2022 1025   CL 99 05/04/2012 1815   CO2 29 11/14/2022 1025   CO2 29 05/04/2012 1815   GLUCOSE 168 (H) 11/14/2022 1025   GLUCOSE 69 05/04/2012 1815   BUN 14 11/14/2022 1025   BUN 12 05/04/2012 1815   CREATININE 0.90 11/14/2022 1025   CALCIUM 9.6 11/14/2022 1025   CALCIUM 9.2 05/04/2012 1815   PROT 7.0 11/14/2022 1025   PROT 8.5 (H) 05/04/2012 1815   ALBUMIN 4.5 03/11/2022 1130   ALBUMIN 4.5 05/04/2012 1815   AST 16 11/14/2022 1025   AST 20 03/11/2022 1130   ALT 23 11/14/2022 1025   ALT 38 03/11/2022 1130   ALT 77 05/04/2012 1815   ALKPHOS 107 03/11/2022 1130   ALKPHOS 129 05/04/2012 1815   BILITOT 0.6 11/14/2022 1025   BILITOT 0.7 03/11/2022 1130   GFRNONAA >60 03/11/2022 1130   GFRNONAA 106 12/05/2020 1014   GFRAA 123 12/05/2020 1014   Lab Results  Component Value Date   WBC 5.5 11/14/2022   NEUTROABS 5.8 03/11/2022   HGB 15.3 11/14/2022   HCT 44.9 11/14/2022   MCV 84.1 11/14/2022   PLT 210 11/14/2022   Imaging:  [facility] Clinician Interpretation: I have personally reviewed the CNS images as listed.  My interpretation, in the context of the patient's clinical presentation, is treatment effect vs true progression  MR BRAIN W WO  CONTRAST  Result Date: 01/12/2023 EXAM: MRI HEAD WITHOUT AND WITH CONTRAST TECHNIQUE: Multiplanar, multiecho pulse sequences of the brain and surrounding structures were obtained without and with intravenous contrast. CONTRAST:  7mL GADAVIST GADOBUTROL 1 MMOL/ML IV SOLN COMPARISON:  Outside MRI of the head October 21, 2022. FINDINGS: Brain: Increased bulk/size of heterogeneous enhancement in the right frontal lobe with the dominant area anteriorly now measuring approximately 2.5 by 3.3 cm in the more posterolateral lesion measuring 1.8 cm. Enhancement extends to involve the genu of  the corpus callosum, also progressed. Linear enhancement tracks laterally at the site of prior biopsy. Extensive T2/FLAIR hyperintensity in the right cerebrum involving the right frontal lobe, anterior right temporal lobe and tracking along the white matter tracts of the right basal ganglia. The extent of signal abnormality is mildly progressed in comparison to prior and extends further posteriorly. Edema also extends across the genu of the corpus callosum into the left frontal lobe. Mass effect is not substantially changed. There is slight leftward midline shift. Scattered areas of prior hemorrhage associated with the mass. No evidence of acute infarct, mass occupying acute hemorrhage, or hydrocephalus. Vascular: Major arterial flow voids are maintained at the skull base. Skull and upper cervical spine: Normal marrow signal. Sinuses/Orbits: Clear sinuses.  No acute orbital findings. Other: No mastoid effusions. IMPRESSION: Findings concerning for tumor progression with increased bulk/size of areas of enhancement in the right frontal lobe and mildly increased extent of T2/FLAIR hyperintensity in the right cerebrum, detailed above. Electronically Signed   By: Feliberto Harts M.D.   On: 01/12/2023 16:43     Pathology report from June 2024 biopsy: A-B. Brain tissue, right frontal lesion, biopsy:   Brain tissue with changes consistent  with treatment effect; see comment.       Comment: There is no definitive morphologic, immunophenotypic, or molecular evidence of the patient's known oligodendroglioma, IDH-mutant and 1p/19q-codeleted; see comment. The specimen is negative for mutations in the TERT promoter and in IDH1/2 by Sanger sequencing.       Reviewed by resident/fellow Bernadette Hoit, GREGORY  Electronically signed by Ulanda Edison, MD on 12/23/2022    Assessment/Plan Oligodendroglioma [facility])  Focal seizures (HCC)  Radiation therapy induced brain necrosis  ARLEIGH ODOWD is clinically stable today, now 2 months removed from second brain biopsy at [facility], which again demonstrated radiation necrosis.  This time progressive right frontal lesion was targeted.  MRI brain demonstrates further progression of right frontal mass in particular.  Now with some extension into corpus callosum.  We discussed options for intervention for radiation necrosis, including corticosteroids (poorly tolerated previously), avastin, continued imaging surveillance.  Fortunately, he is asymptomatic for the time being.     He would prefer to follow up with an additional MRI brain study in ~6 weeks.  Recommended continuing Lamictal 100mg  daily.  We ask that JAVONTAE MARLETTE return to clinic following updated MRI study, or sooner as needed.  All questions were answered. The patient knows to call the clinic with any problems, questions or concerns. No barriers to learning were detected.  The total time spent in the encounter was 40 minutes and more than 50% was on counseling and review of test results   Henreitta Leber, MD Medical Director of Neuro-Oncology [facility] at [facility] 01/13/23 9:03 AM

## 2023-01-15 ENCOUNTER — Other Ambulatory Visit: Payer: Self-pay

## 2023-02-16 ENCOUNTER — Encounter: Payer: Self-pay | Admitting: Internal Medicine

## 2023-02-16 ENCOUNTER — Ambulatory Visit: Payer: BC Managed Care – PPO | Admitting: Internal Medicine

## 2023-02-16 VITALS — BP 122/78 | HR 74 | Temp 95.7°F | Wt 156.0 lb

## 2023-02-16 DIAGNOSIS — E1165 Type 2 diabetes mellitus with hyperglycemia: Secondary | ICD-10-CM | POA: Diagnosis not present

## 2023-02-16 DIAGNOSIS — C719 Malignant neoplasm of brain, unspecified: Secondary | ICD-10-CM

## 2023-02-16 DIAGNOSIS — E1169 Type 2 diabetes mellitus with other specified complication: Secondary | ICD-10-CM | POA: Diagnosis not present

## 2023-02-16 DIAGNOSIS — R569 Unspecified convulsions: Secondary | ICD-10-CM

## 2023-02-16 DIAGNOSIS — E785 Hyperlipidemia, unspecified: Secondary | ICD-10-CM

## 2023-02-16 LAB — POCT GLYCOSYLATED HEMOGLOBIN (HGB A1C): Hemoglobin A1C: 7.6 % — AB (ref 4.0–5.6)

## 2023-02-16 NOTE — Assessment & Plan Note (Signed)
Continue lamotrigine per oncology

## 2023-02-16 NOTE — Assessment & Plan Note (Signed)
POCT A1c 7.6% Urine microalbumin has been checked within the last year Encourage low-carb diet He has declined to take glipizide and Januvia, will discontinue at this time Will request copy of eye exam Encouraged routine foot exam He declines immunizations

## 2023-02-16 NOTE — Assessment & Plan Note (Signed)
Lipid profile reviewed He has declined taking atorvastatin Encouraged him to consume a low-fat diet

## 2023-02-16 NOTE — Assessment & Plan Note (Signed)
Following with oncology 

## 2023-02-16 NOTE — Patient Instructions (Signed)

## 2023-02-16 NOTE — Progress Notes (Signed)
Subjective:    Patient ID: Alan Wise, male    DOB: 06/12/1971, 52 y.o.   MRN: [id number]  HPI  Patient presents to clinic today for 9-month follow-up of chronic conditions.  HLD: His last LDL was 134, triglycerides 122, 10/2022.  He is not taking atorvastatin as prescribed.  He tries to consume low-fat diet.  DM2: His last A1c was 8.2%, 11/2022.  He is not taking glipizide and januvia as prescribed.  He does not check his sugars.  He checks his feet routinely.  His last eye exam was 12/2022.  Flu never.  Pneumovax never.  COVID Janssen x 1.  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  He is taking lamotrigine as prescribed.  He follows with neurology and oncology.  Review of Systems     Past Medical History:  Diagnosis Date   Allergy    Complication of anesthesia    pt reports he is starting to have more difficulty arousing after surgery   Diabetes mellitus (HCC)    GERD (gastroesophageal reflux disease)    Headache    History of kidney stones    Hyperlipidemia    Renal disorder    kidney stones    Current Outpatient Medications  Medication Sig Dispense Refill   atorvastatin (LIPITOR) 40 MG tablet TAKE 1 TABLET BY MOUTH EVERY DAY (Patient not taking: Reported on 08/01/2022) 90 tablet 3   FREESTYLE LITE test strip USE TWO TIMES DAILY TO TEST BLOOD SUGAR (Patient not taking: Reported on 08/01/2022) 50 each 11   glipiZIDE (GLUCOTROL) 10 MG tablet TAKE 1 TABLET BY MOUTH TWICE A DAY (Patient not taking: Reported on 12/29/2022) 180 tablet 0   JANUVIA 100 MG tablet TAKE 1 TABLET BY MOUTH EVERY DAY (Patient not taking: Reported on 12/29/2022) 90 tablet 0   lamoTRIgine (LAMICTAL) 100 MG tablet Take 1 tablet (100 mg total) by mouth daily. 60 tablet 1   No current facility-administered medications for this visit.    Allergies  Allergen Reactions   Contrast Media [Iodinated Contrast Media] Swelling    Family History  Problem Relation Age of Onset   Hyperlipidemia Father     Hypertension Father    Diabetes Father    Benign prostatic hyperplasia Father    Stroke Maternal Grandmother    Diabetes Maternal Grandmother    Stroke Paternal Grandmother    Hypertension Paternal Grandmother    Kidney disease Neg Hx    Prostate cancer Neg Hx     Social History   Socioeconomic History   Marital status: Married    Spouse name: Not on file   Number of children: Not on file   Years of education: Not on file   Highest education level: Not on file  Occupational History   Not on file  Tobacco Use   Smoking status: Never   Smokeless tobacco: Never  Vaping Use   Vaping status: Never Used  Substance and Sexual Activity   Alcohol use: Not Currently    Comment: rare   Drug use: No   Sexual activity: Not on file  Other Topics Concern   Not on file  Social History Narrative   Not on file   Social Determinants of Health   Financial Resource Strain: Low Risk  (12/05/2022)   [facility], [facility]   Overall Financial Resource Strain (CARDIA)    Difficulty of Paying Living Expenses: Not hard at all  Food Insecurity: No Food Insecurity (12/05/2022)  Received from [facility], [facility]   Hunger Vital Sign    Worried About Running Out of Food in the Last Year: Never true    Ran Out of Food in the Last Year: Never true  Transportation Needs: No Transportation Needs (12/05/2022)   Received from [facility], [facility]   [facility] - Transportation    In the past 12 months, has lack of transportation kept you from medical appointments or from getting medications?: No    Lack of Transportation (Non-Medical): No  Physical Activity: Not on file  Stress: Not on file  Social Connections: Not on file  Intimate Partner Violence: Not on file     Constitutional: Denies fever, malaise, fatigue, headache or abrupt weight changes.  HEENT: Denies eye  pain, eye redness, ear pain, ringing in the ears, wax buildup, runny nose, nasal congestion, bloody nose, or sore throat. Respiratory: Denies difficulty breathing, shortness of breath, cough or sputum production.   Cardiovascular: Denies chest pain, chest tightness, palpitations or swelling in the hands or feet.  Gastrointestinal: Denies abdominal pain, bloating, constipation, diarrhea or blood in the stool.  GU: Denies urgency, frequency, pain with urination, burning sensation, blood in urine, odor or discharge. Musculoskeletal: Denies decrease in range of motion, difficulty with gait, muscle pain or joint pain and swelling.  Skin: Denies redness, rashes, lesions or ulcercations.  Neurological: Patient intermittently has some expressive aphasia.  Denies dizziness, difficulty with memory, difficulty with speech or problems with balance and coordination.  Psych: Denies anxiety, depression, SI/HI.  No other specific complaints in a complete review of systems (except as listed in HPI above).  Objective:   Physical Exam   There were no vitals taken for this visit. Wt Readings from Last 3 Encounters:  01/13/23 154 lb 12.8 oz (70.2 kg)  01/07/23 150 lb 9.6 oz (68.3 kg)  12/29/22 153 lb 12.8 oz (69.8 kg)    General: Appears their stated age, well developed, well nourished in NAD. Skin: Warm, dry and intact. No rashes, lesions or ulcerations noted. HEENT: Head: normal shape and size; Eyes: sclera white, no icterus, conjunctiva pink, PERRLA and EOMs intact; Ears: Tm's gray and intact, normal light reflex; Nose: mucosa pink and moist, septum midline; Throat/Mouth: Teeth present, mucosa pink and moist, no exudate, lesions or ulcerations noted.  Neck:  Neck supple, trachea midline. No masses, lumps or thyromegaly present.  Cardiovascular: Normal rate and rhythm. S1,S2 noted.  No murmur, rubs or gallops noted. No JVD or BLE edema. No carotid bruits noted. Pulmonary/Chest: Normal effort and positive  vesicular breath sounds. No respiratory distress. No wheezes, rales or ronchi noted.  Abdomen: Soft and nontender. Normal bowel sounds. No distention or masses noted. Liver, spleen and kidneys non palpable. Musculoskeletal: Normal range of motion. No signs of joint swelling. No difficulty with gait.  Neurological: Alert and oriented. Cranial nerves II-XII grossly intact. Coordination normal.  Psychiatric: Mood and affect normal. Behavior is normal. Judgment and thought content normal.     BMET    Component Value Date/Time   NA 138 11/14/2022 1025   NA 138 05/04/2012 1815   K 4.2 11/14/2022 1025   K 3.5 05/04/2012 1815   CL 100 11/14/2022 1025   CL 99 05/04/2012 1815   CO2 29 11/14/2022 1025   CO2 29 05/04/2012 1815   GLUCOSE 168 (H) 11/14/2022 1025   GLUCOSE 69 05/04/2012 1815   BUN 14 11/14/2022 1025   BUN 12 05/04/2012  1815   CREATININE 0.90 11/14/2022 1025   CALCIUM 9.6 11/14/2022 1025   CALCIUM 9.2 05/04/2012 1815   GFRNONAA >60 03/11/2022 1130   GFRNONAA 106 12/05/2020 1014   GFRAA 123 12/05/2020 1014    Lipid Panel     Component Value Date/Time   CHOL 190 11/14/2022 1025   TRIG 122 11/14/2022 1025   HDL 33 (L) 11/14/2022 1025   CHOLHDL 5.8 (H) 11/14/2022 1025   VLDL 60.6 (H) 08/30/2020 1506   LDLCALC 134 (H) 11/14/2022 1025    CBC    Component Value Date/Time   WBC 5.5 11/14/2022 1025   RBC 5.34 11/14/2022 1025   HGB 15.3 11/14/2022 1025   HGB 16.0 03/11/2022 1130   HGB 16.4 05/04/2012 1815   HCT 44.9 11/14/2022 1025   HCT 48.4 05/04/2012 1815   PLT 210 11/14/2022 1025   PLT 235 03/11/2022 1130   PLT 261 05/04/2012 1815   MCV 84.1 11/14/2022 1025   MCV 86 05/04/2012 1815   MCH 28.7 11/14/2022 1025   MCHC 34.1 11/14/2022 1025   RDW 13.0 11/14/2022 1025   RDW 12.7 05/04/2012 1815   LYMPHSABS 1.0 03/11/2022 1130   MONOABS 0.5 03/11/2022 1130   EOSABS 0.6 (H) 03/11/2022 1130   BASOSABS 0.1 03/11/2022 1130    Hgb A1C Lab Results  Component Value  Date   HGBA1C 9.0 (H) 11/14/2022           Assessment & Plan:     RTC in 3 months, follow-up chronic conditions Nicki Reaper, NP

## 2023-02-17 ENCOUNTER — Other Ambulatory Visit: Payer: Self-pay

## 2023-02-19 ENCOUNTER — Encounter: Payer: Self-pay | Admitting: Internal Medicine

## 2023-02-19 ENCOUNTER — Ambulatory Visit (HOSPITAL_COMMUNITY)
Admission: RE | Admit: 2023-02-19 | Discharge: 2023-02-19 | Disposition: A | Discharge status: Home / Self Care | Payer: BC Managed Care – PPO | Source: Ambulatory Visit | Attending: Internal Medicine | Admitting: Internal Medicine

## 2023-02-19 DIAGNOSIS — I6789 Other cerebrovascular disease: Secondary | ICD-10-CM | POA: Insufficient documentation

## 2023-02-19 DIAGNOSIS — R900 Intracranial space-occupying lesion found on diagnostic imaging of central nervous system: Secondary | ICD-10-CM | POA: Diagnosis not present

## 2023-02-19 DIAGNOSIS — Y842 Radiological procedure and radiotherapy as the cause of abnormal reaction of the patient, or of later complication, without mention of misadventure at the time of the procedure: Secondary | ICD-10-CM | POA: Insufficient documentation

## 2023-02-19 DIAGNOSIS — C719 Malignant neoplasm of brain, unspecified: Secondary | ICD-10-CM | POA: Diagnosis present

## 2023-02-19 MED ORDER — GADOBUTROL 1 MMOL/ML IV SOLN
7.0000 mL | Freq: Once | INTRAVENOUS | Status: AC | PRN
  Administered 2023-02-19: 7 mL via INTRAVENOUS

## 2023-02-26 ENCOUNTER — Inpatient Hospital Stay: Payer: BC Managed Care – PPO | Attending: Internal Medicine | Admitting: Internal Medicine

## 2023-02-26 ENCOUNTER — Encounter: Payer: Self-pay | Admitting: Internal Medicine

## 2023-02-26 ENCOUNTER — Other Ambulatory Visit: Payer: Self-pay | Admitting: Internal Medicine

## 2023-02-26 VITALS — BP 139/81 | HR 65 | Temp 97.5°F | Resp 16 | Wt 155.9 lb

## 2023-02-26 DIAGNOSIS — C719 Malignant neoplasm of brain, unspecified: Secondary | ICD-10-CM

## 2023-02-26 DIAGNOSIS — Z79899 Other long term (current) drug therapy: Secondary | ICD-10-CM | POA: Diagnosis not present

## 2023-02-26 DIAGNOSIS — Z9221 Personal history of antineoplastic chemotherapy: Secondary | ICD-10-CM | POA: Diagnosis not present

## 2023-02-26 DIAGNOSIS — R569 Unspecified convulsions: Secondary | ICD-10-CM | POA: Diagnosis not present

## 2023-02-26 DIAGNOSIS — K219 Gastro-esophageal reflux disease without esophagitis: Secondary | ICD-10-CM | POA: Insufficient documentation

## 2023-02-26 DIAGNOSIS — E119 Type 2 diabetes mellitus without complications: Secondary | ICD-10-CM | POA: Diagnosis not present

## 2023-02-26 DIAGNOSIS — Y842 Radiological procedure and radiotherapy as the cause of abnormal reaction of the patient, or of later complication, without mention of misadventure at the time of the procedure: Secondary | ICD-10-CM

## 2023-02-26 DIAGNOSIS — Z5111 Encounter for antineoplastic chemotherapy: Secondary | ICD-10-CM | POA: Diagnosis not present

## 2023-02-26 DIAGNOSIS — Z87442 Personal history of urinary calculi: Secondary | ICD-10-CM | POA: Diagnosis not present

## 2023-02-26 DIAGNOSIS — E785 Hyperlipidemia, unspecified: Secondary | ICD-10-CM | POA: Insufficient documentation

## 2023-02-26 DIAGNOSIS — R251 Tremor, unspecified: Secondary | ICD-10-CM | POA: Insufficient documentation

## 2023-02-26 DIAGNOSIS — C711 Malignant neoplasm of frontal lobe: Secondary | ICD-10-CM | POA: Insufficient documentation

## 2023-02-26 DIAGNOSIS — Z923 Personal history of irradiation: Secondary | ICD-10-CM | POA: Insufficient documentation

## 2023-02-26 NOTE — Progress Notes (Signed)
[facility] [address]  [place], [place] [address] [phone]  Interval Evaluation  Date of Service: 02/26/23 Patient Name: Alan Wise Patient MRN: [id number] Patient DOB: 1970/12/16 Provider: Henreitta Leber, MD  Identifying Statement:  Alan Wise is a 52 y.o. 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 Maisie Fus; path demonstrates IDH-1 mutant oligodendroglioma   03/20/2021 - 04/30/2021 Radiation Therapy   Completes 54 Gy IMRT and Temodar with Dr. Basilio Cairo   06/02/2021 - 03/05/2022 Chemotherapy   Completes 8 cycles of adjuvant 5-day Temozolomide   02/01/2022 Progression   Progression of disease in multifocal/metastatic/lmd pattern   03/11/2022 Progression   Progression of disease confirmed after additional cycle of TMZ, 1 month follow up MRI.  LP/cytology and MRI spine mets screening unremarkable   04/10/2022 Surgery   Stereotactic biopsy of progressive R frontal lesion at [facility] with Dr. Zachery Conch.  Path demonstrates radiation necrosis   12/03/2022 Surgery   Repeat biopsy with Dr. Zachery Conch, R frontal.  Path again demonstrates necrosis and treatment effect     Biomarkers:   Interval History: Alan Wise presents today for follow up after recent MRI brain.  He does describe increased issues with short term memory, processing speed, mildly worse in past weeks.  Fall semester ongoing.  No further seizures, continues on Lamictal 100mg  daily.  H+P (12/31/20) Patient presented to medical attention this past month, with complaint of involuntary left sided shaking.  He describes episodes, occurring several times per week, of left arm shaking, lasting up to 5 minutes; following the episodes he describes the arm as "heavy" for some time before returning to normal.  He also describes episodes of sudden inability to speak, with strange  movements of face and hands, lasting same amount of time.  These episodes go back ~10 years in very sporadic nature, but have become much more frequent in recent months.  He works full time as a Agricultural consultant at [employer].  These episodes have occurred at work, and have been disruptive in that environment.  He is otherwise fully functional, independent.  Medications: Current Outpatient Medications on File Prior to Visit  Medication Sig Dispense Refill   lamoTRIgine (LAMICTAL) 100 MG tablet Take 1 tablet (100 mg total) by mouth daily. 60 tablet 1   No current facility-administered medications on file prior to visit.    Allergies:  Allergies  Allergen Reactions   Contrast Media [Iodinated Contrast Media] Swelling   Past Medical History:  Past Medical History:  Diagnosis Date   Allergy    Complication of anesthesia    pt reports he is starting to have more difficulty arousing after surgery   Diabetes mellitus (HCC)    GERD (gastroesophageal reflux disease)    Headache    History of kidney stones    Hyperlipidemia    Renal disorder    kidney stones   Past Surgical History:  Past Surgical History:  Procedure Laterality Date   APPLICATION OF CRANIAL NAVIGATION Right 01/17/2021   Procedure: APPLICATION OF CRANIAL NAVIGATION;  Surgeon: Bedelia Person, MD;  Location: [facility] OR;  Service: Neurosurgery;  Laterality: Right;   COLONOSCOPY  09/03/1994   COLONOSCOPY WITH PROPOFOL N/A 01/07/2023   Procedure: COLONOSCOPY WITH PROPOFOL;  Surgeon: [name], MD;  Location[facility] ENDOSCOPY;  Service: Endoscopy;  Laterality: N/A;  NOT TOO EARLY   CYSTOSCOPY WITH STENT PLACEMENT Right 01/25/2016  Procedure: CYSTOSCOPY WITH STENT PLACEMENT;  Surgeon: Hildred Laser, MD;  Location: [facility] ORS;  Service: Urology;  Laterality: Right;   FRAMELESS  BIOPSY WITH BRAINLAB Right 01/17/2021   Procedure: RIGHT STEREOTACTIC BIOPSY OF INSULAR LESION;  Surgeon: Bedelia Person, MD;  Location: [facility] OR;   Service: Neurosurgery;  Laterality: Right;   KNEE ARTHROSCOPY W/ MENISCAL REPAIR Right 06/23/1988   POLYPECTOMY  01/07/2023   Procedure: POLYPECTOMY;  Surgeon: [name], MD;  Location: [facility] ENDOSCOPY;  Service: Endoscopy;;   removal of birthmark  06/08/2013   SKIN CANCER EXCISION  06/23/2012   TYMPANOSTOMY TUBE PLACEMENT  08/06/1978   URETEROSCOPY WITH HOLMIUM LASER LITHOTRIPSY Right 01/25/2016   Procedure: URETEROSCOPY WITH HOLMIUM LASER LITHOTRIPSY;  Surgeon: Hildred Laser, MD;  Location: [facility] ORS;  Service: Urology;  Laterality: Right;   URETHRAL STRICTURE DILATATION     visual inspection of vocal cord     1973   WISDOM TOOTH EXTRACTION     Social History:  Social History   Socioeconomic History   Marital status: Married    Spouse name: Not on file   Number of children: Not on file   Years of education: Not on file   Highest education level: Not on file  Occupational History   Not on file  Tobacco Use   Smoking status: Never   Smokeless tobacco: Never  Vaping Use   Vaping status: Never Used  Substance and Sexual Activity   Alcohol use: Not Currently    Comment: rare   Drug use: No   Sexual activity: Not on file  Other Topics Concern   Not on file  Social History Narrative   Not on file   Social Determinants of Health   Financial Resource Strain: Low Risk  (12/05/2022)   Received from [facility], [facility]   Overall Financial Resource Strain (CARDIA)    Difficulty of Paying Living Expenses: Not hard at all  Food Insecurity: No Food Insecurity (12/05/2022)   Received from [facility], [facility]   Hunger Vital Sign    Worried About Running Out of Food in the Last Year: Never true    Ran Out of Food in the Last Year: Never true  Transportation Needs: No Transportation Needs (12/05/2022)   Received from [facility], [facility]   [facility] -  Transportation    In the past 12 months, has lack of transportation kept you from medical appointments or from getting medications?: No    Lack of Transportation (Non-Medical): No  Physical Activity: Not on file  Stress: Not on file  Social Connections: Not on file  Intimate Partner Violence: Not on file   Family History:  Family History  Problem Relation Age of Onset   Hyperlipidemia Father    Hypertension Father    Diabetes Father    Benign prostatic hyperplasia Father    Stroke Maternal Grandmother    Diabetes Maternal Grandmother    Stroke Paternal Grandmother    Hypertension Paternal Grandmother    Kidney disease Neg Hx    Prostate cancer Neg Hx     Review of Systems: Constitutional: Doesn't report fevers, chills or abnormal weight loss Eyes: Doesn't report blurriness of vision Ears, nose, mouth, throat, and face: Doesn't report sore throat Respiratory: Doesn't report cough, dyspnea or wheezes Cardiovascular: Doesn't report palpitation, chest discomfort  Gastrointestinal:  Doesn't report nausea, constipation, diarrhea GU: Doesn't report incontinence Skin: Doesn't report skin  rashes Neurological: Per HPI Musculoskeletal: Doesn't report joint pain Behavioral/Psych: Doesn't report anxiety  Physical Exam: Vitals:   02/26/23 0905  BP: 139/81  Pulse: 65  Resp: 16  Temp: (!) 97.5 F (36.4 C)  SpO2: 100%   KPS: 90. General: Fatigued Head: Normal EENT: No conjunctival injection or scleral icterus.  Lungs: Resp effort normal Cardiac: Regular rate Abdomen: Non-distended abdomen Skin: No rashes cyanosis or petechiae. Extremities: No clubbing or edema  Neurologic Exam: Mental Status: Awake, alert, attentive to examiner. Oriented to self and environment. Language is fluent with intact comprehension.  Modest psychomotor slowing, impaired 3 object recall Cranial Nerves: Visual acuity is grossly normal. Visual fields are full. Extra-ocular movements intact. No ptosis.  Face is symmetric Motor: Tone and bulk are normal. Power is full in both arms and legs. Reflexes are symmetric, no pathologic reflexes present.  Sensory: Intact to light touch Gait: Normal.   Labs: I have reviewed the data as listed    Component Value Date/Time   NA 138 11/14/2022 1025   NA 138 05/04/2012 1815   K 4.2 11/14/2022 1025   K 3.5 05/04/2012 1815   CL 100 11/14/2022 1025   CL 99 05/04/2012 1815   CO2 29 11/14/2022 1025   CO2 29 05/04/2012 1815   GLUCOSE 168 (H) 11/14/2022 1025   GLUCOSE 69 05/04/2012 1815   BUN 14 11/14/2022 1025   BUN 12 05/04/2012 1815   CREATININE 0.90 11/14/2022 1025   CALCIUM 9.6 11/14/2022 1025   CALCIUM 9.2 05/04/2012 1815   PROT 7.0 11/14/2022 1025   PROT 8.5 (H) 05/04/2012 1815   ALBUMIN 4.5 03/11/2022 1130   ALBUMIN 4.5 05/04/2012 1815   AST 16 11/14/2022 1025   AST 20 03/11/2022 1130   ALT 23 11/14/2022 1025   ALT 38 03/11/2022 1130   ALT 77 05/04/2012 1815   ALKPHOS 107 03/11/2022 1130   ALKPHOS 129 05/04/2012 1815   BILITOT 0.6 11/14/2022 1025   BILITOT 0.7 03/11/2022 1130   GFRNONAA >60 03/11/2022 1130   GFRNONAA 106 12/05/2020 1014   GFRAA 123 12/05/2020 1014   Lab Results  Component Value Date   WBC 5.5 11/14/2022   NEUTROABS 5.8 03/11/2022   HGB 15.3 11/14/2022   HCT 44.9 11/14/2022   MCV 84.1 11/14/2022   PLT 210 11/14/2022   Imaging:  [facility] Clinician Interpretation: I have personally reviewed the CNS images as listed.  My interpretation, in the context of the patient's clinical presentation, is treatment effect vs true progression pending official read  No results found.   Pathology report from June 2024 biopsy: A-B. Brain tissue, right frontal lesion, biopsy:   Brain tissue with changes consistent with treatment effect; see comment.       Comment: There is no definitive morphologic, immunophenotypic, or molecular evidence of the patient's known oligodendroglioma, IDH-mutant and 1p/19q-codeleted; see  comment. The specimen is negative for mutations in the TERT promoter and in IDH1/2 by Sanger sequencing.       Reviewed by resident/fellow Bernadette Hoit, GREGORY  Electronically signed by Ulanda Edison, MD on 12/23/2022     Assessment/Plan Oligodendroglioma [facility])  Focal seizures (HCC)  Radiation therapy induced brain necrosis  Alan Wise is clinically stable today, now ~4 months removed from second brain biopsy at [facility], which again demonstrated radiation necrosis.  This time progressive right frontal lesion was targeted.  MRI brain demonstrates ongoing progression of right frontal mass.  We discussed options for intervention for radiation necrosis, including corticosteroids (  poorly tolerated previously), avastin, continued imaging surveillance.  He is agreeable to trial of avastin 10mg /kg q2-3 weeks given prior failure of steroids.  Recommended initiating avastin 10mg /kg IV q3 weeks x3 cycles, followed by repeat MRI brain.  We reviewed side effects including hypertension, wound healing impairment.  Urine protein will be checked every other cycle in addition to CBC.  Informed consent was obtained verbally at bedside to proceed with antibody therapy.  Avastin should be held for the following:  ANC less than 500  Platelets less than 50,000  LFT or creatinine greater than 2x ULN  If clinical concerns/contraindications develop  Recommended continuing Lamictal 100mg  daily.  We ask that Leeanne Mannan return to clinic for cycle #1 avastin, or sooner if needed.  All questions were answered. The patient knows to call the clinic with any problems, questions or concerns. No barriers to learning were detected.  The total time spent in the encounter was 40 minutes and more than 50% was on counseling and review of test results   Henreitta Leber, MD Medical Director of Neuro-Oncology [facility] 02/26/23 9:22 AM

## 2023-03-10 ENCOUNTER — Telehealth: Payer: Self-pay | Admitting: Internal Medicine

## 2023-03-10 ENCOUNTER — Inpatient Hospital Stay: Payer: BC Managed Care – PPO

## 2023-03-10 ENCOUNTER — Ambulatory Visit: Payer: BC Managed Care – PPO

## 2023-03-10 ENCOUNTER — Inpatient Hospital Stay: Payer: BC Managed Care – PPO | Admitting: Internal Medicine

## 2023-03-10 ENCOUNTER — Encounter: Payer: Self-pay | Admitting: Internal Medicine

## 2023-03-10 NOTE — Telephone Encounter (Signed)
Called patient regarding 09/18 appointments, patient is notified.

## 2023-03-12 ENCOUNTER — Inpatient Hospital Stay: Payer: BC Managed Care – PPO

## 2023-03-12 ENCOUNTER — Inpatient Hospital Stay: Payer: BC Managed Care – PPO | Admitting: Internal Medicine

## 2023-03-12 VITALS — BP 145/85 | HR 62 | Temp 98.1°F | Resp 9

## 2023-03-12 DIAGNOSIS — C719 Malignant neoplasm of brain, unspecified: Secondary | ICD-10-CM

## 2023-03-12 DIAGNOSIS — I6789 Other cerebrovascular disease: Secondary | ICD-10-CM

## 2023-03-12 DIAGNOSIS — Y842 Radiological procedure and radiotherapy as the cause of abnormal reaction of the patient, or of later complication, without mention of misadventure at the time of the procedure: Secondary | ICD-10-CM

## 2023-03-12 DIAGNOSIS — C711 Malignant neoplasm of frontal lobe: Secondary | ICD-10-CM | POA: Diagnosis not present

## 2023-03-12 LAB — CBC WITH DIFFERENTIAL (CANCER CENTER ONLY)
Abs Immature Granulocytes: 0.03 10*3/uL (ref 0.00–0.07)
Basophils Absolute: 0.1 10*3/uL (ref 0.0–0.1)
Basophils Relative: 1 %
Eosinophils Absolute: 0.2 10*3/uL (ref 0.0–0.5)
Eosinophils Relative: 4 %
HCT: 43.6 % (ref 39.0–52.0)
Hemoglobin: 15.9 g/dL (ref 13.0–17.0)
Immature Granulocytes: 1 %
Lymphocytes Relative: 19 %
Lymphs Abs: 1.2 10*3/uL (ref 0.7–4.0)
MCH: 30.1 pg (ref 26.0–34.0)
MCHC: 36.5 g/dL — ABNORMAL HIGH (ref 30.0–36.0)
MCV: 82.6 fL (ref 80.0–100.0)
Monocytes Absolute: 0.5 10*3/uL (ref 0.1–1.0)
Monocytes Relative: 7 %
Neutro Abs: 4.6 10*3/uL (ref 1.7–7.7)
Neutrophils Relative %: 68 %
Platelet Count: 200 10*3/uL (ref 150–400)
RBC: 5.28 MIL/uL (ref 4.22–5.81)
RDW: 11.9 % (ref 11.5–15.5)
WBC Count: 6.6 10*3/uL (ref 4.0–10.5)
nRBC: 0 % (ref 0.0–0.2)

## 2023-03-12 LAB — TOTAL PROTEIN, URINE DIPSTICK: Protein, ur: NEGATIVE mg/dL

## 2023-03-12 MED ORDER — SODIUM CHLORIDE 0.9 % IV SOLN: Freq: Once | INTRAVENOUS | Status: AC

## 2023-03-12 MED ORDER — SODIUM CHLORIDE 0.9 % IV SOLN
10.0000 mg/kg | Freq: Once | INTRAVENOUS | Status: AC
  Administered 2023-03-12: 700 mg via INTRAVENOUS
  Filled 2023-03-12: qty 16

## 2023-03-12 NOTE — Progress Notes (Signed)
[facility] at [facility] [address]  [place], [place] [address] [phone]  Interval Evaluation  Date of Service: 03/12/23 Patient Name: Alan Wise Patient MRN: [id number] Patient DOB: Jun 13, 1971 Provider: Henreitta Leber, MD  Identifying Statement:  Alan Wise is a 52 y.o. 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 Maisie Fus; path demonstrates IDH-1 mutant oligodendroglioma   03/20/2021 - 04/30/2021 Radiation Therapy   Completes 54 Gy IMRT and Temodar with Dr. Basilio Cairo   06/02/2021 - 03/05/2022 Chemotherapy   Completes 8 cycles of adjuvant 5-day Temozolomide   02/01/2022 Progression   Progression of disease in multifocal/metastatic/lmd pattern   03/11/2022 Progression   Progression of disease confirmed after additional cycle of TMZ, 1 month follow up MRI.  LP/cytology and MRI spine mets screening unremarkable   04/10/2022 Surgery   Stereotactic biopsy of progressive R frontal lesion at [facility] with Dr. Zachery Conch.  Path demonstrates radiation necrosis   12/03/2022 Surgery   Repeat biopsy with Dr. Zachery Conch, R frontal.  Path again demonstrates necrosis and treatment effect   03/05/2023 -  Chemotherapy   Patient is on Treatment Plan : BRAIN GBM Bevacizumab 14d x 6 cycles       Biomarkers:   Interval History: Alan Wise presents today for initiation of avastin.  No changes with short term memory, processing speed since prior visit.  Fall semester ongoing.  No further seizures, continues on Lamictal 100mg  daily.  H+P (12/31/20) Patient presented to medical attention this past month, with complaint of involuntary left sided shaking.  He describes episodes, occurring several times per week, of left arm shaking, lasting up to 5 minutes; following the episodes he describes the arm as "heavy" for some time before returning to normal.  He also describes  episodes of sudden inability to speak, with strange movements of face and hands, lasting same amount of time.  These episodes go back ~10 years in very sporadic nature, but have become much more frequent in recent months.  He works full time as a Agricultural consultant at [employer].  These episodes have occurred at work, and have been disruptive in that environment.  He is otherwise fully functional, independent.  Medications: Current Outpatient Medications on File Prior to Visit  Medication Sig Dispense Refill   lamoTRIgine (LAMICTAL) 100 MG tablet TAKE 1 TABLET BY MOUTH EVERY DAY 60 tablet 1   No current facility-administered medications on file prior to visit.    Allergies:  Allergies  Allergen Reactions   Contrast Media [Iodinated Contrast Media] Swelling   Past Medical History:  Past Medical History:  Diagnosis Date   Allergy    Complication of anesthesia    pt reports he is starting to have more difficulty arousing after surgery   Diabetes mellitus (HCC)    GERD (gastroesophageal reflux disease)    Headache    History of kidney stones    Hyperlipidemia    Renal disorder    kidney stones   Past Surgical History:  Past Surgical History:  Procedure Laterality Date   APPLICATION OF CRANIAL NAVIGATION Right 01/17/2021   Procedure: APPLICATION OF CRANIAL NAVIGATION;  Surgeon: Bedelia Person, MD;  Location: [facility] OR;  Service: Neurosurgery;  Laterality: Right;   COLONOSCOPY  09/03/1994   COLONOSCOPY WITH PROPOFOL N/A 01/07/2023   Procedure: COLONOSCOPY WITH PROPOFOL;  Surgeon: [name], MD;  Location: [facility] ENDOSCOPY;  Service: Endoscopy;  Laterality:  N/A;  NOT TOO EARLY   CYSTOSCOPY WITH STENT PLACEMENT Right 01/25/2016   Procedure: CYSTOSCOPY WITH STENT PLACEMENT;  Surgeon: Hildred Laser, MD;  Location: [facility] ORS;  Service: Urology;  Laterality: Right;   FRAMELESS  BIOPSY WITH BRAINLAB Right 01/17/2021   Procedure: RIGHT STEREOTACTIC BIOPSY OF INSULAR LESION;  Surgeon:  Bedelia Person, MD;  Location: [facility] OR;  Service: Neurosurgery;  Laterality: Right;   KNEE ARTHROSCOPY W/ MENISCAL REPAIR Right 06/23/1988   POLYPECTOMY  01/07/2023   Procedure: POLYPECTOMY;  Surgeon: [name], MD;  Location: [facility] ENDOSCOPY;  Service: Endoscopy;;   removal of birthmark  06/08/2013   SKIN CANCER EXCISION  06/23/2012   TYMPANOSTOMY TUBE PLACEMENT  08/06/1978   URETEROSCOPY WITH HOLMIUM LASER LITHOTRIPSY Right 01/25/2016   Procedure: URETEROSCOPY WITH HOLMIUM LASER LITHOTRIPSY;  Surgeon: Hildred Laser, MD;  Location: [facility] ORS;  Service: Urology;  Laterality: Right;   URETHRAL STRICTURE DILATATION     visual inspection of vocal cord     1973   WISDOM TOOTH EXTRACTION     Social History:  Social History   Socioeconomic History   Marital status: Married    Spouse name: Not on file   Number of children: Not on file   Years of education: Not on file   Highest education level: Not on file  Occupational History   Not on file  Tobacco Use   Smoking status: Never   Smokeless tobacco: Never  Vaping Use   Vaping status: Never Used  Substance and Sexual Activity   Alcohol use: Not Currently    Comment: rare   Drug use: No   Sexual activity: Not on file  Other Topics Concern   Not on file  Social History Narrative   Not on file   Social Determinants of Health   Financial Resource Strain: Low Risk  (12/05/2022)   Received from [facility], [facility]   Overall Financial Resource Strain (CARDIA)    Difficulty of Paying Living Expenses: Not hard at all  Food Insecurity: No Food Insecurity (12/05/2022)   Received from [facility], [facility]   Hunger Vital Sign    Worried About Running Out of Food in the Last Year: Never true    Ran Out of Food in the Last Year: Never true  Transportation Needs: No Transportation Needs (12/05/2022)   Received from [facility], [facility]   [facility] - Transportation    In the past 12 months, has lack of transportation kept you from medical appointments or from getting medications?: No    Lack of Transportation (Non-Medical): No  Physical Activity: Not on file  Stress: Not on file  Social Connections: Not on file  Intimate Partner Violence: Not on file   Family History:  Family History  Problem Relation Age of Onset   Hyperlipidemia Father    Hypertension Father    Diabetes Father    Benign prostatic hyperplasia Father    Stroke Maternal Grandmother    Diabetes Maternal Grandmother    Stroke Paternal Grandmother    Hypertension Paternal Grandmother    Kidney disease Neg Hx    Prostate cancer Neg Hx     Review of Systems: Constitutional: Doesn't report fevers, chills or abnormal weight loss Eyes: Doesn't report blurriness of vision Ears, nose, mouth, throat, and face: Doesn't report sore throat Respiratory: Doesn't report cough, dyspnea or wheezes Cardiovascular: Doesn't report palpitation, chest discomfort  Gastrointestinal:  Doesn't report nausea, constipation, diarrhea GU: Doesn't report incontinence Skin: Doesn't report skin rashes Neurological: Per HPI Musculoskeletal: Doesn't report joint pain Behavioral/Psych: Doesn't report anxiety  Physical Exam: Vitals:   03/12/23 1418  BP: 138/85  Pulse: 78  Resp: 20  Temp: 97.9 F (36.6 C)  SpO2: 97%   KPS: 90. General: Fatigued Head: Normal EENT: No conjunctival injection or scleral icterus.  Lungs: Resp effort normal Cardiac: Regular rate Abdomen: Non-distended abdomen Skin: No rashes cyanosis or petechiae. Extremities: No clubbing or edema  Neurologic Exam: Mental Status: Awake, alert, attentive to examiner. Oriented to self and environment. Language is fluent with intact comprehension.  Modest psychomotor slowing, impaired 3 object recall Cranial Nerves: Visual acuity is grossly normal. Visual fields are full.  Extra-ocular movements intact. No ptosis. Face is symmetric Motor: Tone and bulk are normal. Power is full in both arms and legs. Reflexes are symmetric, no pathologic reflexes present.  Sensory: Intact to light touch Gait: Normal.   Labs: I have reviewed the data as listed    Component Value Date/Time   NA 138 11/14/2022 1025   NA 138 05/04/2012 1815   K 4.2 11/14/2022 1025   K 3.5 05/04/2012 1815   CL 100 11/14/2022 1025   CL 99 05/04/2012 1815   CO2 29 11/14/2022 1025   CO2 29 05/04/2012 1815   GLUCOSE 168 (H) 11/14/2022 1025   GLUCOSE 69 05/04/2012 1815   BUN 14 11/14/2022 1025   BUN 12 05/04/2012 1815   CREATININE 0.90 11/14/2022 1025   CALCIUM 9.6 11/14/2022 1025   CALCIUM 9.2 05/04/2012 1815   PROT 7.0 11/14/2022 1025   PROT 8.5 (H) 05/04/2012 1815   ALBUMIN 4.5 03/11/2022 1130   ALBUMIN 4.5 05/04/2012 1815   AST 16 11/14/2022 1025   AST 20 03/11/2022 1130   ALT 23 11/14/2022 1025   ALT 38 03/11/2022 1130   ALT 77 05/04/2012 1815   ALKPHOS 107 03/11/2022 1130   ALKPHOS 129 05/04/2012 1815   BILITOT 0.6 11/14/2022 1025   BILITOT 0.7 03/11/2022 1130   GFRNONAA >60 03/11/2022 1130   GFRNONAA 106 12/05/2020 1014   GFRAA 123 12/05/2020 1014   Lab Results  Component Value Date   WBC 6.6 03/12/2023   NEUTROABS 4.6 03/12/2023   HGB 15.9 03/12/2023   HCT 43.6 03/12/2023   MCV 82.6 03/12/2023   PLT 200 03/12/2023   Imaging:  [facility] Clinician Interpretation: I have personally reviewed the CNS images as listed.  My interpretation, in the context of the patient's clinical presentation, is treatment effect vs true progression pending official read  MR BRAIN W WO CONTRAST  Result Date: 02/26/2023 CLINICAL DATA:  52 year old male with history of oligodendroglioma, ependymal, subependymal and/or parenchymal metastatic disease suspected on MRI last year. And further progression on MRI this year. Subsequent encounter. EXAM: MRI HEAD WITHOUT AND WITH CONTRAST TECHNIQUE:  Multiplanar, multiecho pulse sequences of the brain and surrounding structures were obtained without and with intravenous contrast. CONTRAST:  7mL GADAVIST GADOBUTROL 1 MMOL/ML IV SOLN COMPARISON:  Brain MRI 01/09/2023 and earlier. FINDINGS: Brain: Infiltrative T2 and FLAIR hyperintensity in the anterior and central right hemisphere persists with associated mild mass effect on the right lateral ventricle. This has substantially progressed since 03/08/2022, including new expansion and extension across midline at the genu of the corpus callosum (series 9, image 29) since that time. But compared to July, the T2/FLAIR signal is stable. However, following contrast, nodular, irregular multifocal enhancement in the right hemisphere  demonstrates mild further progression, which is most a months trouble in the anterior right frontal lobe now on series 17, image 20 when compared to the same series and image in July. The overall enhancement pattern is not significantly changed. But there is evidence of a small brand new area of faint enhancement in the right inferior frontal gyrus now on series 16, image 69. Furthermore, there is new indistinct T2 and FLAIR hyperintensity in the left pontine white matter on series 8, image 9. And there is questionable faint petechial and enhancement there also (series 16, image 47). And this lesion also demonstrates suspicious new abnormal diffusion although not clearly restricted on ADC. No brainstem mass effect. No left cerebral hemisphere involvement. Trace leftward midline shift of the anterior septum pellucidum is stable. No abnormal diffusion suggestive of acute infarct. Hemosiderin on SWI is stable. No ventriculomegaly, or acute intracranial hemorrhage. Cervicomedullary junction and pituitary are within normal limits. Vascular: Major intracranial vascular flow voids are stable. And following contrast the major dural venous sinuses are enhancing and appear to remain patent. Skull and  upper cervical spine: Negative visible cervical spine and spinal cord. Visualized bone marrow signal is within normal limits. Sinuses/Orbits: Stable, negative. Other: Mastoids remain clear. IMPRESSION: 1. Mild progressive tumor related enhancement in the right cerebral hemisphere since July. T2/FLAIR hyperintensity and mild hemisphere mass effect remain stable. 2. New subcentimeter indeterminate although lesion in the left pons. While this might be a subacute lacunar infarct, new tumor involvement here cannot be excluded. Attention directed on follow-up. 3. No other new intracranial abnormality. Electronically Signed   By: Odessa Fleming M.D.   On: 02/26/2023 10:30     Pathology report from June 2024 biopsy: A-B. Brain tissue, right frontal lesion, biopsy:   Brain tissue with changes consistent with treatment effect; see comment.       Comment: There is no definitive morphologic, immunophenotypic, or molecular evidence of the patient's known oligodendroglioma, IDH-mutant and 1p/19q-codeleted; see comment. The specimen is negative for mutations in the TERT promoter and in IDH1/2 by Sanger sequencing.       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  Recommended initiating avastin 10mg /kg IV q3 weeks x3 cycles, followed by repeat MRI brain.  We reviewed side effects including hypertension, wound healing impairment.  Urine protein will be checked every other cycle in addition to CBC.  Informed consent was obtained verbally at bedside to proceed with antibody therapy.  Avastin should be held for the following:  ANC less than 500   Platelets less than 50,000  LFT or creatinine greater than 2x ULN  If clinical concerns/contraindications develop  Recommended continuing Lamictal 100mg  daily.  We ask that RANDELL BESSETT return to clinic in 3 weeks for cycle #2 avastin, or sooner if needed.  All questions were answered. The patient knows to call the clinic with any problems, questions or concerns. No barriers to learning were detected.  The total time spent in the encounter was 30 minutes and more than 50% was on counseling and review of test results   Henreitta Leber, MD Medical Director of Neuro-Oncology [facility] at [place] 03/12/23 2:11 PM

## 2023-03-12 NOTE — Patient Instructions (Signed)
[facility] [place] [facility] AT [facility]  Discharge Instructions: Thank you for choosing [facility] to provide your oncology and hematology care.   If you have a lab appointment with the Cancer Center, please go directly to the Cancer Center and check in at the registration area.   Wear comfortable clothing and clothing appropriate for easy access to any Portacath or PICC line.   We strive to give you quality time with your provider. You may need to reschedule your appointment if you arrive late (15 or more minutes).  Arriving late affects you and other patients whose appointments are after yours.  Also, if you miss three or more appointments without notifying the office, you may be dismissed from the clinic at the provider's discretion.      For prescription refill requests, have your pharmacy contact our office and allow 72 hours for refills to be completed.    Today you received the following chemotherapy and/or immunotherapy agents: Bevacizumab      To help prevent nausea and vomiting after your treatment, we encourage you to take your nausea medication as directed.  BELOW ARE SYMPTOMS THAT SHOULD BE REPORTED IMMEDIATELY: *FEVER GREATER THAN 100.4 F (38 C) OR HIGHER *CHILLS OR SWEATING *NAUSEA AND VOMITING THAT IS NOT CONTROLLED WITH YOUR NAUSEA MEDICATION *UNUSUAL SHORTNESS OF BREATH *UNUSUAL BRUISING OR BLEEDING *URINARY PROBLEMS (pain or burning when urinating, or frequent urination) *BOWEL PROBLEMS (unusual diarrhea, constipation, pain near the anus) TENDERNESS IN MOUTH AND THROAT WITH OR WITHOUT PRESENCE OF ULCERS (sore throat, sores in mouth, or a toothache) UNUSUAL RASH, SWELLING OR PAIN  UNUSUAL VAGINAL DISCHARGE OR ITCHING   Items with * indicate a potential emergency and should be followed up as soon as possible or go to the Emergency Department if any problems should occur.  Please show the CHEMOTHERAPY ALERT CARD or IMMUNOTHERAPY ALERT CARD at  check-in to the Emergency Department and triage nurse.  Should you have questions after your visit or need to cancel or reschedule your appointment, please contact [place] [facility]  Dept: [phone]  and follow the prompts.  Office hours are 8:00 a.m. to 4:30 p.m. Monday - Friday. Please note that voicemails left after 4:00 p.m. may not be returned until the following business day.  We are closed weekends and major holidays. You have access to a nurse at all times for urgent questions. Please call the main number to the clinic Dept: [phone] and follow the prompts.   For any non-urgent questions, you may also contact your provider using MyChart. We now offer e-Visits for anyone 61 and older to request care online for non-urgent symptoms. For details visit [email or web address].   Also download the MyChart app! Go to the app store, search "MyChart", open the app, select [place], and log in with your MyChart username and password.  Bevacizumab Injection What is this medication? BEVACIZUMAB (be va SIZ yoo mab) treats some types of cancer. It works by blocking a protein that causes cancer cells to grow and multiply. This helps to slow or stop the spread of cancer cells. It is a monoclonal antibody. This medicine may be used for other purposes; ask your health care provider or pharmacist if you have questions. COMMON BRAND NAME(S): Alymsys, Avastin, MVASI, Omer Jack What should I tell my care team before I take this medication? They need to know if you have any of these conditions: Blood clots Coughing up blood Having or recent surgery Heart  failure High blood pressure History of a connection between 2 or more body parts that do not usually connect (fistula) History of a tear in your stomach or intestines Protein in your urine An unusual or allergic reaction to bevacizumab, other medications, foods, dyes, or preservatives Pregnant or trying to get  pregnant Breast-feeding How should I use this medication? This medication is injected into a vein. It is given by your care team in a hospital or clinic setting. Talk to your care team the use of this medication in children. Special care may be needed. Overdosage: If you think you have taken too much of this medicine contact a poison control center or emergency room at once. NOTE: This medicine is only for you. Do not share this medicine with others. What if I miss a dose? Keep appointments for follow-up doses. It is important not to miss your dose. Call your care team if you are unable to keep an appointment. What may interact with this medication? Interactions are not expected. This list may not describe all possible interactions. Give your health care provider a list of all the medicines, herbs, non-prescription drugs, or dietary supplements you use. Also tell them if you smoke, drink alcohol, or use illegal drugs. Some items may interact with your medicine. What should I watch for while using this medication? Your condition will be monitored carefully while you are receiving this medication. You may need blood work while taking this medication. This medication may make you feel generally unwell. This is not uncommon as chemotherapy can affect healthy cells as well as cancer cells. Report any side effects. Continue your course of treatment even though you feel ill unless your care team tells you to stop. This medication may increase your risk to bruise or bleed. Call your care team if you notice any unusual bleeding. Before having surgery, talk to your care team to make sure it is ok. This medication can increase the risk of poor healing of your surgical site or wound. You will need to stop this medication for 28 days before surgery. After surgery, wait at least 28 days before restarting this medication. Make sure the surgical site or wound is healed enough before restarting this medication. Talk  to your care team if questions. Talk to your care team if you may be pregnant. Serious birth defects can occur if you take this medication during pregnancy and for 6 months after the last dose. Contraception is recommended while taking this medication and for 6 months after the last dose. Your care team can help you find the option that works for you. Do not breastfeed while taking this medication and for 6 months after the last dose. This medication can cause infertility. Talk to your care team if you are concerned about your fertility. What side effects may I notice from receiving this medication? Side effects that you should report to your care team as soon as possible: Allergic reactions--skin rash, itching, hives, swelling of the face, lips, tongue, or throat Bleeding--bloody or black, tar-like stools, vomiting blood or brown material that looks like coffee grounds, red or dark brown urine, small red or purple spots on skin, unusual bruising or bleeding Blood clot--pain, swelling, or warmth in the leg, shortness of breath, chest pain Heart attack--pain or tightness in the chest, shoulders, arms, or jaw, nausea, shortness of breath, cold or clammy skin, feeling faint or lightheaded Heart failure--shortness of breath, swelling of the ankles, feet, or hands, sudden weight gain, unusual weakness or  fatigue Increase in blood pressure Infection--fever, chills, cough, sore throat, wounds that don't heal, pain or trouble when passing urine, general feeling of discomfort or being unwell Infusion reactions--chest pain, shortness of breath or trouble breathing, feeling faint or lightheaded Kidney injury--decrease in the amount of urine, swelling of the ankles, hands, or feet Stomach pain that is severe, does not go away, or gets worse Stroke--sudden numbness or weakness of the face, arm, or leg, trouble speaking, confusion, trouble walking, loss of balance or coordination, dizziness, severe headache,  change in vision Sudden and severe headache, confusion, change in vision, seizures, which may be signs of posterior reversible encephalopathy syndrome (PRES) Side effects that usually do not require medical attention (report to your care team if they continue or are bothersome): Back pain Change in taste Diarrhea Dry skin Increased tears Nosebleed This list may not describe all possible side effects. Call your doctor for medical advice about side effects. You may report side effects to FDA at 1-800-FDA-1088. Where should I keep my medication? This medication is given in a hospital or clinic. It will not be stored at home. NOTE: This sheet is a summary. It may not cover all possible information. If you have questions about this medicine, talk to your doctor, pharmacist, or health care provider.  2024 Elsevier/Gold Standard (2021-10-25 00:00:00)

## 2023-03-13 ENCOUNTER — Telehealth: Payer: Self-pay

## 2023-03-13 NOTE — Telephone Encounter (Signed)
Dr. Barbaraann Cao - first time bevacizumab f/u call - tolerated well Received: Alan Dice, RN  P Onc Triage Nurse [facility] Caller: Unspecified Alan Wise,  4:56 PM)

## 2023-03-13 NOTE — Telephone Encounter (Signed)
Alan Wise states that he is doing fine. He is eating, drinking, and urinating well. He knows to call the office at [phone] if he has any questions or concerns.

## 2023-04-01 ENCOUNTER — Other Ambulatory Visit: Payer: Self-pay

## 2023-04-01 DIAGNOSIS — C719 Malignant neoplasm of brain, unspecified: Secondary | ICD-10-CM

## 2023-04-02 ENCOUNTER — Inpatient Hospital Stay: Payer: BC Managed Care – PPO

## 2023-04-02 ENCOUNTER — Inpatient Hospital Stay: Payer: BC Managed Care – PPO | Attending: Internal Medicine

## 2023-04-02 ENCOUNTER — Inpatient Hospital Stay: Payer: BC Managed Care – PPO | Admitting: Internal Medicine

## 2023-04-02 ENCOUNTER — Encounter: Payer: Self-pay | Admitting: Internal Medicine

## 2023-04-02 VITALS — BP 130/80 | HR 62 | Temp 97.7°F | Resp 20 | Wt 158.6 lb

## 2023-04-02 DIAGNOSIS — C719 Malignant neoplasm of brain, unspecified: Secondary | ICD-10-CM

## 2023-04-02 DIAGNOSIS — I6789 Other cerebrovascular disease: Secondary | ICD-10-CM

## 2023-04-02 DIAGNOSIS — C711 Malignant neoplasm of frontal lobe: Secondary | ICD-10-CM | POA: Insufficient documentation

## 2023-04-02 DIAGNOSIS — R569 Unspecified convulsions: Secondary | ICD-10-CM | POA: Insufficient documentation

## 2023-04-02 DIAGNOSIS — Y842 Radiological procedure and radiotherapy as the cause of abnormal reaction of the patient, or of later complication, without mention of misadventure at the time of the procedure: Secondary | ICD-10-CM

## 2023-04-02 DIAGNOSIS — Z5111 Encounter for antineoplastic chemotherapy: Secondary | ICD-10-CM | POA: Diagnosis present

## 2023-04-02 DIAGNOSIS — Z923 Personal history of irradiation: Secondary | ICD-10-CM | POA: Insufficient documentation

## 2023-04-02 LAB — CBC WITH DIFFERENTIAL (CANCER CENTER ONLY)
Abs Immature Granulocytes: 0.04 10*3/uL (ref 0.00–0.07)
Basophils Absolute: 0.1 10*3/uL (ref 0.0–0.1)
Basophils Relative: 1 %
Eosinophils Absolute: 0.3 10*3/uL (ref 0.0–0.5)
Eosinophils Relative: 4 %
HCT: 43.5 % (ref 39.0–52.0)
Hemoglobin: 15.5 g/dL (ref 13.0–17.0)
Immature Granulocytes: 1 %
Lymphocytes Relative: 21 %
Lymphs Abs: 1.3 10*3/uL (ref 0.7–4.0)
MCH: 29.5 pg (ref 26.0–34.0)
MCHC: 35.6 g/dL (ref 30.0–36.0)
MCV: 82.9 fL (ref 80.0–100.0)
Monocytes Absolute: 0.5 10*3/uL (ref 0.1–1.0)
Monocytes Relative: 8 %
Neutro Abs: 3.9 10*3/uL (ref 1.7–7.7)
Neutrophils Relative %: 65 %
Platelet Count: 214 10*3/uL (ref 150–400)
RBC: 5.25 MIL/uL (ref 4.22–5.81)
RDW: 11.8 % (ref 11.5–15.5)
WBC Count: 6 10*3/uL (ref 4.0–10.5)
nRBC: 0 % (ref 0.0–0.2)

## 2023-04-02 MED ORDER — SODIUM CHLORIDE 0.9 % IV SOLN
10.0000 mg/kg | Freq: Once | INTRAVENOUS | Status: AC
  Administered 2023-04-02: 700 mg via INTRAVENOUS
  Filled 2023-04-02: qty 16

## 2023-04-02 MED ORDER — SODIUM CHLORIDE 0.9 % IV SOLN: Freq: Once | INTRAVENOUS | Status: AC

## 2023-04-02 NOTE — Progress Notes (Signed)
[facility] at [facility] [address]  [place], [place] [address] [phone]  Interval Evaluation  Date of Service: 04/02/23 Patient Name: Alan Wise Patient MRN: [id number] Patient DOB: 05-16-1971 Provider: Henreitta Leber, MD  Identifying Statement:  Alan Wise is a 52 y.o. 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 Maisie Fus; path demonstrates IDH-1 mutant oligodendroglioma   03/20/2021 - 04/30/2021 Radiation Therapy   Completes 54 Gy IMRT and Temodar with Dr. Basilio Cairo   06/02/2021 - 03/05/2022 Chemotherapy   Completes 8 cycles of adjuvant 5-day Temozolomide   02/01/2022 Progression   Progression of disease in multifocal/metastatic/lmd pattern   03/11/2022 Progression   Progression of disease confirmed after additional cycle of TMZ, 1 month follow up MRI.  LP/cytology and MRI spine mets screening unremarkable   04/10/2022 Surgery   Stereotactic biopsy of progressive R frontal lesion at [facility] with Dr. Zachery Conch.  Path demonstrates radiation necrosis   12/03/2022 Surgery   Repeat biopsy with Dr. Zachery Conch, R frontal.  Path again demonstrates necrosis and treatment effect   03/12/2023 -  Chemotherapy   Patient is on Treatment Plan : BRAIN GBM Bevacizumab 14d x 6 cycles       Biomarkers:   Interval History: Alan Wise presents today for cycle #2 of avastin.  Tolerated first treatment well, no ill effects. No changes with short term memory, processing speed since prior visit.  Fall semester ongoing.  No further seizures, continues on Lamictal 100mg  daily.  H+P (12/31/20) Patient presented to medical attention this past month, with complaint of involuntary left sided shaking.  He describes episodes, occurring several times per week, of left arm shaking, lasting up to 5 minutes; following the episodes he describes the arm as "heavy" for some time  before returning to normal.  He also describes episodes of sudden inability to speak, with strange movements of face and hands, lasting same amount of time.  These episodes go back ~10 years in very sporadic nature, but have become much more frequent in recent months.  He works full time as a Agricultural consultant at [employer].  These episodes have occurred at work, and have been disruptive in that environment.  He is otherwise fully functional, independent.  Medications: Current Outpatient Medications on File Prior to Visit  Medication Sig Dispense Refill   lamoTRIgine (LAMICTAL) 100 MG tablet TAKE 1 TABLET BY MOUTH EVERY DAY 60 tablet 1   No current facility-administered medications on file prior to visit.    Allergies:  Allergies  Allergen Reactions   Contrast Media [Iodinated Contrast Media] Swelling   Past Medical History:  Past Medical History:  Diagnosis Date   Allergy    Complication of anesthesia    pt reports he is starting to have more difficulty arousing after surgery   Diabetes mellitus (HCC)    GERD (gastroesophageal reflux disease)    Headache    History of kidney stones    Hyperlipidemia    Renal disorder    kidney stones   Past Surgical History:  Past Surgical History:  Procedure Laterality Date   APPLICATION OF CRANIAL NAVIGATION Right 01/17/2021   Procedure: APPLICATION OF CRANIAL NAVIGATION;  Surgeon: Bedelia Person, MD;  Location: [facility] OR;  Service: Neurosurgery;  Laterality: Right;   COLONOSCOPY  09/03/1994   COLONOSCOPY WITH PROPOFOL N/A 01/07/2023   Procedure: COLONOSCOPY WITH PROPOFOL;  Surgeon: [name], MD;  Location: [facility] ENDOSCOPY;  Service: Endoscopy;  Laterality: N/A;  NOT TOO EARLY   CYSTOSCOPY WITH STENT PLACEMENT Right 01/25/2016   Procedure: CYSTOSCOPY WITH STENT PLACEMENT;  Surgeon: Hildred Laser, MD;  Location: [facility] ORS;  Service: Urology;  Laterality: Right;   FRAMELESS  BIOPSY WITH BRAINLAB Right 01/17/2021   Procedure: RIGHT  STEREOTACTIC BIOPSY OF INSULAR LESION;  Surgeon: Bedelia Person, MD;  Location: [facility] OR;  Service: Neurosurgery;  Laterality: Right;   KNEE ARTHROSCOPY W/ MENISCAL REPAIR Right 06/23/1988   POLYPECTOMY  01/07/2023   Procedure: POLYPECTOMY;  Surgeon: [name], MD;  Location: [facility] ENDOSCOPY;  Service: Endoscopy;;   removal of birthmark  06/08/2013   SKIN CANCER EXCISION  06/23/2012   TYMPANOSTOMY TUBE PLACEMENT  08/06/1978   URETEROSCOPY WITH HOLMIUM LASER LITHOTRIPSY Right 01/25/2016   Procedure: URETEROSCOPY WITH HOLMIUM LASER LITHOTRIPSY;  Surgeon: Hildred Laser, MD;  Location: [facility] ORS;  Service: Urology;  Laterality: Right;   URETHRAL STRICTURE DILATATION     visual inspection of vocal cord     1973   WISDOM TOOTH EXTRACTION     Social History:  Social History   Socioeconomic History   Marital status: Married    Spouse name: Not on file   Number of children: Not on file   Years of education: Not on file   Highest education level: Not on file  Occupational History   Not on file  Tobacco Use   Smoking status: Never   Smokeless tobacco: Never  Vaping Use   Vaping status: Never Used  Substance and Sexual Activity   Alcohol use: Not Currently    Comment: rare   Drug use: No   Sexual activity: Not on file  Other Topics Concern   Not on file  Social History Narrative   Not on file   Social Determinants of Health   Financial Resource Strain: Low Risk  (12/05/2022)   Received from [facility], [facility]   Overall Financial Resource Strain (CARDIA)    Difficulty of Paying Living Expenses: Not hard at all  Food Insecurity: No Food Insecurity (12/05/2022)   Received from [facility], [facility]   Hunger Vital Sign    Worried About Running Out of Food in the Last Year: Never true    Ran Out of Food in the Last Year: Never true  Transportation Needs: No Transportation Needs (12/05/2022)    Received from [facility], [facility]   [facility] - Transportation    In the past 12 months, has lack of transportation kept you from medical appointments or from getting medications?: No    Lack of Transportation (Non-Medical): No  Physical Activity: Not on file  Stress: Not on file  Social Connections: Not on file  Intimate Partner Violence: Not on file   Family History:  Family History  Problem Relation Age of Onset   Hyperlipidemia Father    Hypertension Father    Diabetes Father    Benign prostatic hyperplasia Father    Stroke Maternal Grandmother    Diabetes Maternal Grandmother    Stroke Paternal Grandmother    Hypertension Paternal Grandmother    Kidney disease Neg Hx    Prostate cancer Neg Hx     Review of Systems: Constitutional: Doesn't report fevers, chills or abnormal weight loss Eyes: Doesn't report blurriness of vision Ears, nose, mouth, throat, and face: Doesn't report sore throat Respiratory: Doesn't report cough, dyspnea or  wheezes Cardiovascular: Doesn't report palpitation, chest discomfort  Gastrointestinal:  Doesn't report nausea, constipation, diarrhea GU: Doesn't report incontinence Skin: Doesn't report skin rashes Neurological: Per HPI Musculoskeletal: Doesn't report joint pain Behavioral/Psych: Doesn't report anxiety  Physical Exam: Vitals:   04/02/23 1140  BP: (!) 165/90  Pulse: 62  Resp: 20  Temp: 97.7 F (36.5 C)  SpO2: 99%   KPS: 90. General: Fatigued Head: Normal EENT: No conjunctival injection or scleral icterus.  Lungs: Resp effort normal Cardiac: Regular rate Abdomen: Non-distended abdomen Skin: No rashes cyanosis or petechiae. Extremities: No clubbing or edema  Neurologic Exam: Mental Status: Awake, alert, attentive to examiner. Oriented to self and environment. Language is fluent with intact comprehension.  Modest psychomotor slowing, impaired 3 object recall Cranial Nerves: Visual acuity  is grossly normal. Visual fields are full. Extra-ocular movements intact. No ptosis. Face is symmetric Motor: Tone and bulk are normal. Power is full in both arms and legs. Reflexes are symmetric, no pathologic reflexes present.  Sensory: Intact to light touch Gait: Normal.   Labs: I have reviewed the data as listed    Component Value Date/Time   NA 138 11/14/2022 1025   NA 138 05/04/2012 1815   K 4.2 11/14/2022 1025   K 3.5 05/04/2012 1815   CL 100 11/14/2022 1025   CL 99 05/04/2012 1815   CO2 29 11/14/2022 1025   CO2 29 05/04/2012 1815   GLUCOSE 168 (H) 11/14/2022 1025   GLUCOSE 69 05/04/2012 1815   BUN 14 11/14/2022 1025   BUN 12 05/04/2012 1815   CREATININE 0.90 11/14/2022 1025   CALCIUM 9.6 11/14/2022 1025   CALCIUM 9.2 05/04/2012 1815   PROT 7.0 11/14/2022 1025   PROT 8.5 (H) 05/04/2012 1815   ALBUMIN 4.5 03/11/2022 1130   ALBUMIN 4.5 05/04/2012 1815   AST 16 11/14/2022 1025   AST 20 03/11/2022 1130   ALT 23 11/14/2022 1025   ALT 38 03/11/2022 1130   ALT 77 05/04/2012 1815   ALKPHOS 107 03/11/2022 1130   ALKPHOS 129 05/04/2012 1815   BILITOT 0.6 11/14/2022 1025   BILITOT 0.7 03/11/2022 1130   GFRNONAA >60 03/11/2022 1130   GFRNONAA 106 12/05/2020 1014   GFRAA 123 12/05/2020 1014   Lab Results  Component Value Date   WBC 6.0 04/02/2023   NEUTROABS 3.9 04/02/2023   HGB 15.5 04/02/2023   HCT 43.5 04/02/2023   MCV 82.9 04/02/2023   PLT 214 04/02/2023     Pathology report from June 2024 biopsy: A-B. Brain tissue, right frontal lesion, biopsy:   Brain tissue with changes consistent with treatment effect; see comment.       Comment: There is no definitive morphologic, immunophenotypic, or molecular evidence of the patient's known oligodendroglioma, IDH-mutant and 1p/19q-codeleted; see comment. The specimen is negative for mutations in the TERT promoter and in IDH1/2 by Sanger sequencing.       Reviewed by resident/fellow Bernadette Hoit,  GREGORY  Electronically signed by Ulanda Edison, MD on 12/23/2022     Assessment/Plan Radiation therapy induced brain necrosis  Oligodendroglioma (HCC)  Alan Wise is clinically stable today, having completed cycle #1 of avastin 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  Recommended continuing with cycle #2 avastin 10mg /kg IV q3 weeks x3 cycles, followed by repeat MRI brain.  We reviewed side effects including hypertension, wound healing impairment.  Urine protein will be checked every other cycle in addition to CBC.  Informed consent was obtained verbally at bedside to proceed with antibody  therapy.  Avastin should be held for the following:  ANC less than 500  Platelets less than 50,000  LFT or creatinine greater than 2x ULN  If clinical concerns/contraindications develop  Recommended continuing Lamictal 100mg  daily.  We ask that Alan Wise return to clinic in 3 weeks for cycle #3 avastin, or sooner if needed.  All questions were answered. The patient knows to call the clinic with any problems, questions or concerns. No barriers to learning were detected.  The total time spent in the encounter was 30 minutes and more than 50% was on counseling and review of test results   Henreitta Leber, MD Medical Director of Neuro-Oncology [facility] 04/02/23 11:53 AM

## 2023-04-02 NOTE — Progress Notes (Signed)
Per Dr Barbaraann Cao, no Urine protein needed today.

## 2023-04-11 ENCOUNTER — Other Ambulatory Visit: Payer: Self-pay

## 2023-04-11 ENCOUNTER — Emergency Department
Admission: EM | Admit: 2023-04-11 | Discharge: 2023-04-12 | Disposition: A | Discharge status: Home / Self Care | Payer: BC Managed Care – PPO | Attending: Emergency Medicine | Admitting: Emergency Medicine

## 2023-04-11 ENCOUNTER — Emergency Department: Payer: BC Managed Care – PPO

## 2023-04-11 DIAGNOSIS — Y93H2 Activity, gardening and landscaping: Secondary | ICD-10-CM | POA: Insufficient documentation

## 2023-04-11 DIAGNOSIS — Y92007 Garden or yard of unspecified non-institutional (private) residence as the place of occurrence of the external cause: Secondary | ICD-10-CM | POA: Insufficient documentation

## 2023-04-11 DIAGNOSIS — S20412A Abrasion of left back wall of thorax, initial encounter: Secondary | ICD-10-CM | POA: Diagnosis not present

## 2023-04-11 DIAGNOSIS — W1781XA Fall down embankment (hill), initial encounter: Secondary | ICD-10-CM | POA: Insufficient documentation

## 2023-04-11 DIAGNOSIS — W19XXXA Unspecified fall, initial encounter: Secondary | ICD-10-CM

## 2023-04-11 DIAGNOSIS — S299XXA Unspecified injury of thorax, initial encounter: Secondary | ICD-10-CM | POA: Diagnosis present

## 2023-04-11 DIAGNOSIS — T148XXA Other injury of unspecified body region, initial encounter: Secondary | ICD-10-CM

## 2023-04-11 DIAGNOSIS — S40212A Abrasion of left shoulder, initial encounter: Secondary | ICD-10-CM | POA: Insufficient documentation

## 2023-04-11 DIAGNOSIS — S20212A Contusion of left front wall of thorax, initial encounter: Secondary | ICD-10-CM | POA: Diagnosis not present

## 2023-04-11 DIAGNOSIS — T07XXXA Unspecified multiple injuries, initial encounter: Secondary | ICD-10-CM

## 2023-04-11 MED ORDER — OXYCODONE-ACETAMINOPHEN 5-325 MG PO TABS
1.0000 | ORAL_TABLET | ORAL | Status: DC | PRN
Start: 2023-04-11 — End: 2023-04-12
  Administered 2023-04-11: 1 via ORAL
  Filled 2023-04-11: qty 1

## 2023-04-11 NOTE — ED Triage Notes (Signed)
First Nurse Note;  Pt via [facility] from home. Pt was push mowing, loss his footing pt did fall down a steep hill. Abrasion L sided on the chest. Neighbor gave pt a SL Nitroglycerin because pt was having "chest pain". Pt has a hx of brain tumor and has speech difficulties. Pt is A&Ox4 and NAD

## 2023-04-11 NOTE — ED Triage Notes (Signed)
See first nurse note. Pt fell while mowing on slope in yard. C/o left sided rib pain. Denies blood thinner use. When asked about LOC, pt states he cannot answer that because "nobody came along for awhile" C/o left shoulder pain, No obvious deformity noted Pt has brain tumor, some difficulties with communication noted, wife reports he seems at baseline currently.

## 2023-04-12 MED ORDER — OXYCODONE-ACETAMINOPHEN 5-325 MG PO TABS
1.0000 | ORAL_TABLET | Freq: Once | ORAL | Status: AC
  Administered 2023-04-12: 1 via ORAL
  Filled 2023-04-12: qty 1

## 2023-04-12 MED ORDER — OXYCODONE-ACETAMINOPHEN 5-325 MG PO TABS: 1.0000 | ORAL_TABLET | Freq: Four times a day (QID) | ORAL | 0 refills | Status: DC | PRN

## 2023-04-12 NOTE — ED Notes (Signed)
Discharge instructions reviewed with pt / support person.   Newly prescribed medications discussed and pharmacy verified.   Opportunity for questions and concerns provided.   Alert, oriented, ambulatory, and displays no signs of distress prior to departure.

## 2023-04-12 NOTE — Discharge Instructions (Addendum)
You have been seen in the Emergency Department (ED) today for a fall.  Your work up does not show any concerning injuries.  Please take over-the-counter ibuprofen and/or Tylenol as needed for your pain (unless you have an allergy or your doctor as told you not to take them), or take any prescribed medication as instructed.  Take Percocet as prescribed for severe pain. Do not drink alcohol, drive or participate in any other potentially dangerous activities while taking this medication as it may make you sleepy. Do not take this medication with any other sedating medications, either prescription or over-the-counter. If you were prescribed Percocet or Vicodin, do not take these with acetaminophen (Tylenol) as it is already contained within these medications.   This medication is an opiate (or narcotic) pain medication and can be habit forming.  Use it as little as possible to achieve adequate pain control.  Do not use or use it with extreme caution if you have a history of opiate abuse or dependence.  If you are on a pain contract with your primary care doctor or a pain specialist, be sure to let them know you were prescribed this medication today from the [facility] Emergency Department.  This medication is intended for your use only - do not give any to anyone else and keep it in a secure place where nobody else, especially children, have access to it.  It will also cause or worsen constipation, so you may want to consider taking an over-the-counter stool softener while you are taking this medication.   Please follow up with your doctor regarding today's Emergency Department (ED) visit and your recent fall.    Return to the ED if you have any headache, confusion, slurred speech, weakness/numbness of any arm or leg, or any increased pain.

## 2023-04-12 NOTE — ED Provider Notes (Signed)
[facility] Provider Note    Event Date/Time   First MD Initiated Contact with Patient 04/11/23 2337     (approximate)   History   No chief complaint on file.   HPI Alan Wise is a 52 y.o. male whose medical history is notable for chronic brain tumor.  He presents for evaluation after a fall.  It occurred while he was mowing the lawn.  He was on an incline and lost balance and fell down a hill and into some sort of a ravine he was stuck there for period of time prior to getting out.  He is not certain if he lost consciousness.  He has pain in his left shoulder left chest wall.  He is here now with his wife at bedside who reports that some of his hesitating speech and communication difficulties are baseline for him.     Physical Exam   Triage Vital Signs: ED Triage Vitals  Encounter Vitals Group     BP 04/11/23 1839 (!) 141/89     Systolic BP Percentile --      Diastolic BP Percentile --      Pulse Rate 04/11/23 1839 76     Resp 04/11/23 1839 20     Temp 04/11/23 1837 97.8 F (36.6 C)     Temp src --      SpO2 04/11/23 1839 100 %     Weight 04/11/23 1839 69.9 kg (154 lb)     Height 04/11/23 1839 1.727 m (5\' 8" )     Head Circumference --      Peak Flow --      Pain Score 04/11/23 1839 5     Pain Loc --      Pain Education --      Exclude from Growth Chart --     Most recent vital signs: Vitals:   04/11/23 1839 04/12/23 0156  BP: (!) 141/89 (!) 151/94  Pulse: 76 80  Resp: 20 18  Temp:  98.3 F (36.8 C)  SpO2: 100% 99%    General: Awake, no obvious distress. CV:  Good peripheral perfusion.  Regular rate and rhythm, normal heart sounds. Resp:  Normal effort. Speaking easily and comfortably (other than from his chronic communication difficulties from the brain tumor )without respiratory compromise, no accessory muscle usage nor intercostal retractions.  Lungs clear to auscultation. Abd:  No distention.  Other:  Patient has bruising  and abrasions to the left side of his chest wall, left shoulder, and the left upper part of his back.  He is tender to palpation all throughout but without any palpable deformities to his ribs or bony structures like his clavicle and sternum.   ED Results / Procedures / Treatments   Labs (all labs ordered are listed, but only abnormal results are displayed) Labs Reviewed - No data to display   RADIOLOGY I viewed and interpreted the patient's left shoulder x-rays and 2 view chest x-ray.  No evidence of fracture dislocation or acute injury of other sort.  I also read the radiologist's report, which confirmed no acute findings.   PROCEDURES:  Critical Care performed: No  Procedures    IMPRESSION / MDM / ASSESSMENT AND PLAN / ED COURSE  I reviewed the triage vital signs and the nursing notes.                              Differential diagnosis includes,  but is not limited to, fracture, dislocation, pulmonary or cardiac contusion.  Patient's presentation is most consistent with acute complicated illness / injury requiring diagnostic workup.  Labs/studies ordered: Left shoulder x-ray, 2 view chest x-ray  Interventions/Medications given:  Medications  oxyCODONE-acetaminophen (PERCOCET/ROXICET) 5-325 MG per tablet 1 tablet (1 tablet Oral Given 04/12/23 0152)    (Note:  hospital course my include additional interventions and/or labs/studies not listed above.)   Patient has evidence of chest wall contusion but fortunately without any acute abnormalities on his x-rays.  Vital signs are stable and within normal limits and patient has been waiting for an extended period of time which is given an extended period of observation and there is no evidence he needs additional advanced imaging.  Pain was well-controlled with the Percocet although he still has pain to palpation or manipulation of the chest wall.  I discussed outpatient management with the patient and his wife and they will  follow-up as an outpatient.  I have verified no concerning prescribing habits and the [place] controlled substance database and wrote him a prescription for Percocet.  I gave my usual and customary management recommendations and return precautions and he and his wife understand and agree with the plan.         FINAL CLINICAL IMPRESSION(S) / ED DIAGNOSES   Final diagnoses:  Fall, initial encounter  Contusion of left chest wall, initial encounter  Multiple abrasions  Musculoskeletal strain     Rx / DC Orders   ED Discharge Orders          Ordered    oxyCODONE-acetaminophen (PERCOCET) 5-325 MG tablet  Every 6 hours PRN        04/12/23 0144             Note:  This document was prepared using Dragon voice recognition software and may include unintentional dictation errors.   Loleta Rose, MD 04/12/23 [phone]

## 2023-04-23 ENCOUNTER — Inpatient Hospital Stay: Payer: BC Managed Care – PPO

## 2023-04-23 ENCOUNTER — Inpatient Hospital Stay: Payer: BC Managed Care – PPO | Admitting: Internal Medicine

## 2023-04-23 ENCOUNTER — Encounter: Payer: Self-pay | Admitting: Internal Medicine

## 2023-04-23 VITALS — BP 160/85 | HR 90 | Temp 97.3°F | Resp 18 | Wt 156.0 lb

## 2023-04-23 VITALS — BP 160/94 | HR 72 | Temp 98.3°F | Resp 18

## 2023-04-23 DIAGNOSIS — Z5111 Encounter for antineoplastic chemotherapy: Secondary | ICD-10-CM | POA: Diagnosis not present

## 2023-04-23 DIAGNOSIS — Y842 Radiological procedure and radiotherapy as the cause of abnormal reaction of the patient, or of later complication, without mention of misadventure at the time of the procedure: Secondary | ICD-10-CM

## 2023-04-23 DIAGNOSIS — C719 Malignant neoplasm of brain, unspecified: Secondary | ICD-10-CM

## 2023-04-23 DIAGNOSIS — I6789 Other cerebrovascular disease: Secondary | ICD-10-CM | POA: Diagnosis not present

## 2023-04-23 DIAGNOSIS — R569 Unspecified convulsions: Secondary | ICD-10-CM | POA: Diagnosis not present

## 2023-04-23 LAB — CBC WITH DIFFERENTIAL (CANCER CENTER ONLY)
Abs Immature Granulocytes: 0.01 10*3/uL (ref 0.00–0.07)
Basophils Absolute: 0.1 10*3/uL (ref 0.0–0.1)
Basophils Relative: 1 %
Eosinophils Absolute: 0.3 10*3/uL (ref 0.0–0.5)
Eosinophils Relative: 5 %
HCT: 44 % (ref 39.0–52.0)
Hemoglobin: 15.5 g/dL (ref 13.0–17.0)
Immature Granulocytes: 0 %
Lymphocytes Relative: 17 %
Lymphs Abs: 1.3 10*3/uL (ref 0.7–4.0)
MCH: 29.1 pg (ref 26.0–34.0)
MCHC: 35.2 g/dL (ref 30.0–36.0)
MCV: 82.7 fL (ref 80.0–100.0)
Monocytes Absolute: 0.5 10*3/uL (ref 0.1–1.0)
Monocytes Relative: 7 %
Neutro Abs: 5.1 10*3/uL (ref 1.7–7.7)
Neutrophils Relative %: 70 %
Platelet Count: 194 10*3/uL (ref 150–400)
RBC: 5.32 MIL/uL (ref 4.22–5.81)
RDW: 12.1 % (ref 11.5–15.5)
WBC Count: 7.3 10*3/uL (ref 4.0–10.5)
nRBC: 0 % (ref 0.0–0.2)

## 2023-04-23 LAB — TOTAL PROTEIN, URINE DIPSTICK: Protein, ur: 30 mg/dL — AB

## 2023-04-23 MED ORDER — SODIUM CHLORIDE 0.9 % IV SOLN: Freq: Once | INTRAVENOUS | Status: AC

## 2023-04-23 MED ORDER — SODIUM CHLORIDE 0.9 % IV SOLN
10.0000 mg/kg | Freq: Once | INTRAVENOUS | Status: AC
  Administered 2023-04-23: 700 mg via INTRAVENOUS
  Filled 2023-04-23: qty 16

## 2023-04-23 NOTE — Patient Instructions (Signed)
[facility] [place] [facility] AT [facility]  Discharge Instructions: Thank you for choosing [facility] to provide your oncology and hematology care.   If you have a lab appointment with the Cancer Center, please go directly to the Cancer Center and check in at the registration area.   Wear comfortable clothing and clothing appropriate for easy access to any Portacath or PICC line.   We strive to give you quality time with your provider. You may need to reschedule your appointment if you arrive late (15 or more minutes).  Arriving late affects you and other patients whose appointments are after yours.  Also, if you miss three or more appointments without notifying the office, you may be dismissed from the clinic at the provider's discretion.      For prescription refill requests, have your pharmacy contact our office and allow 72 hours for refills to be completed.    Today you received the following chemotherapy and/or immunotherapy agents: Bevacizumab      To help prevent nausea and vomiting after your treatment, we encourage you to take your nausea medication as directed.  BELOW ARE SYMPTOMS THAT SHOULD BE REPORTED IMMEDIATELY: *FEVER GREATER THAN 100.4 F (38 C) OR HIGHER *CHILLS OR SWEATING *NAUSEA AND VOMITING THAT IS NOT CONTROLLED WITH YOUR NAUSEA MEDICATION *UNUSUAL SHORTNESS OF BREATH *UNUSUAL BRUISING OR BLEEDING *URINARY PROBLEMS (pain or burning when urinating, or frequent urination) *BOWEL PROBLEMS (unusual diarrhea, constipation, pain near the anus) TENDERNESS IN MOUTH AND THROAT WITH OR WITHOUT PRESENCE OF ULCERS (sore throat, sores in mouth, or a toothache) UNUSUAL RASH, SWELLING OR PAIN  UNUSUAL VAGINAL DISCHARGE OR ITCHING   Items with * indicate a potential emergency and should be followed up as soon as possible or go to the Emergency Department if any problems should occur.  Please show the CHEMOTHERAPY ALERT CARD or IMMUNOTHERAPY ALERT CARD at  check-in to the Emergency Department and triage nurse.  Should you have questions after your visit or need to cancel or reschedule your appointment, please contact [place] [facility]  Dept: [phone]  and follow the prompts.  Office hours are 8:00 a.m. to 4:30 p.m. Monday - Friday. Please note that voicemails left after 4:00 p.m. may not be returned until the following business day.  We are closed weekends and major holidays. You have access to a nurse at all times for urgent questions. Please call the main number to the clinic Dept: [phone] and follow the prompts.   For any non-urgent questions, you may also contact your provider using MyChart. We now offer e-Visits for anyone 61 and older to request care online for non-urgent symptoms. For details visit [email or web address].   Also download the MyChart app! Go to the app store, search "MyChart", open the app, select [place], and log in with your MyChart username and password.  Bevacizumab Injection What is this medication? BEVACIZUMAB (be va SIZ yoo mab) treats some types of cancer. It works by blocking a protein that causes cancer cells to grow and multiply. This helps to slow or stop the spread of cancer cells. It is a monoclonal antibody. This medicine may be used for other purposes; ask your health care provider or pharmacist if you have questions. COMMON BRAND NAME(S): Alymsys, Avastin, MVASI, Omer Jack What should I tell my care team before I take this medication? They need to know if you have any of these conditions: Blood clots Coughing up blood Having or recent surgery Heart  failure High blood pressure History of a connection between 2 or more body parts that do not usually connect (fistula) History of a tear in your stomach or intestines Protein in your urine An unusual or allergic reaction to bevacizumab, other medications, foods, dyes, or preservatives Pregnant or trying to get  pregnant Breast-feeding How should I use this medication? This medication is injected into a vein. It is given by your care team in a hospital or clinic setting. Talk to your care team the use of this medication in children. Special care may be needed. Overdosage: If you think you have taken too much of this medicine contact a poison control center or emergency room at once. NOTE: This medicine is only for you. Do not share this medicine with others. What if I miss a dose? Keep appointments for follow-up doses. It is important not to miss your dose. Call your care team if you are unable to keep an appointment. What may interact with this medication? Interactions are not expected. This list may not describe all possible interactions. Give your health care provider a list of all the medicines, herbs, non-prescription drugs, or dietary supplements you use. Also tell them if you smoke, drink alcohol, or use illegal drugs. Some items may interact with your medicine. What should I watch for while using this medication? Your condition will be monitored carefully while you are receiving this medication. You may need blood work while taking this medication. This medication may make you feel generally unwell. This is not uncommon as chemotherapy can affect healthy cells as well as cancer cells. Report any side effects. Continue your course of treatment even though you feel ill unless your care team tells you to stop. This medication may increase your risk to bruise or bleed. Call your care team if you notice any unusual bleeding. Before having surgery, talk to your care team to make sure it is ok. This medication can increase the risk of poor healing of your surgical site or wound. You will need to stop this medication for 28 days before surgery. After surgery, wait at least 28 days before restarting this medication. Make sure the surgical site or wound is healed enough before restarting this medication. Talk  to your care team if questions. Talk to your care team if you may be pregnant. Serious birth defects can occur if you take this medication during pregnancy and for 6 months after the last dose. Contraception is recommended while taking this medication and for 6 months after the last dose. Your care team can help you find the option that works for you. Do not breastfeed while taking this medication and for 6 months after the last dose. This medication can cause infertility. Talk to your care team if you are concerned about your fertility. What side effects may I notice from receiving this medication? Side effects that you should report to your care team as soon as possible: Allergic reactions--skin rash, itching, hives, swelling of the face, lips, tongue, or throat Bleeding--bloody or black, tar-like stools, vomiting blood or brown material that looks like coffee grounds, red or dark brown urine, small red or purple spots on skin, unusual bruising or bleeding Blood clot--pain, swelling, or warmth in the leg, shortness of breath, chest pain Heart attack--pain or tightness in the chest, shoulders, arms, or jaw, nausea, shortness of breath, cold or clammy skin, feeling faint or lightheaded Heart failure--shortness of breath, swelling of the ankles, feet, or hands, sudden weight gain, unusual weakness or  fatigue Increase in blood pressure Infection--fever, chills, cough, sore throat, wounds that don't heal, pain or trouble when passing urine, general feeling of discomfort or being unwell Infusion reactions--chest pain, shortness of breath or trouble breathing, feeling faint or lightheaded Kidney injury--decrease in the amount of urine, swelling of the ankles, hands, or feet Stomach pain that is severe, does not go away, or gets worse Stroke--sudden numbness or weakness of the face, arm, or leg, trouble speaking, confusion, trouble walking, loss of balance or coordination, dizziness, severe headache,  change in vision Sudden and severe headache, confusion, change in vision, seizures, which may be signs of posterior reversible encephalopathy syndrome (PRES) Side effects that usually do not require medical attention (report to your care team if they continue or are bothersome): Back pain Change in taste Diarrhea Dry skin Increased tears Nosebleed This list may not describe all possible side effects. Call your doctor for medical advice about side effects. You may report side effects to FDA at 1-800-FDA-1088. Where should I keep my medication? This medication is given in a hospital or clinic. It will not be stored at home. NOTE: This sheet is a summary. It may not cover all possible information. If you have questions about this medicine, talk to your doctor, pharmacist, or health care provider.  2024 Elsevier/Gold Standard (2021-10-25 00:00:00)

## 2023-04-23 NOTE — Progress Notes (Signed)
[facility] at [facility] [address]  [place], [place] [address] [phone]  Interval Evaluation  Date of Service: 04/23/23 Patient Name: Alan Wise Patient MRN: [id number] Patient DOB: January 30, 1971 Provider: Henreitta Leber, MD  Identifying Statement:  Alan Wise is a 52 y.o. 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 Maisie Fus; path demonstrates IDH-1 mutant oligodendroglioma   03/20/2021 - 04/30/2021 Radiation Therapy   Completes 54 Gy IMRT and Temodar with Dr. Basilio Cairo   06/02/2021 - 03/05/2022 Chemotherapy   Completes 8 cycles of adjuvant 5-day Temozolomide   02/01/2022 Progression   Progression of disease in multifocal/metastatic/lmd pattern   03/11/2022 Progression   Progression of disease confirmed after additional cycle of TMZ, 1 month follow up MRI.  LP/cytology and MRI spine mets screening unremarkable   04/10/2022 Surgery   Stereotactic biopsy of progressive R frontal lesion at [facility] with Dr. Zachery Conch.  Path demonstrates radiation necrosis   12/03/2022 Surgery   Repeat biopsy with Dr. Zachery Conch, R frontal.  Path again demonstrates necrosis and treatment effect   03/12/2023 -  Chemotherapy   Patient is on Treatment Plan : BRAIN GBM Bevacizumab 14d x 6 cycles       Biomarkers:   Interval History: Alan Wise presents today for cycle #3 of avastin.  Tolerating treatments well, no ill effects. No changes with short term memory, processing speed since prior visit.  Fall semester ongoing.  No further seizures, continues on Lamictal 100mg  daily.  H+P (12/31/20) Patient presented to medical attention this past month, with complaint of involuntary left sided shaking.  He describes episodes, occurring several times per week, of left arm shaking, lasting up to 5 minutes; following the episodes he describes the arm as "heavy" for some time before  returning to normal.  He also describes episodes of sudden inability to speak, with strange movements of face and hands, lasting same amount of time.  These episodes go back ~10 years in very sporadic nature, but have become much more frequent in recent months.  He works full time as a Agricultural consultant at [employer].  These episodes have occurred at work, and have been disruptive in that environment.  He is otherwise fully functional, independent.  Medications: Current Outpatient Medications on File Prior to Visit  Medication Sig Dispense Refill   lamoTRIgine (LAMICTAL) 100 MG tablet TAKE 1 TABLET BY MOUTH EVERY DAY 60 tablet 1   oxyCODONE-acetaminophen (PERCOCET) 5-325 MG tablet Take 1 tablet by mouth every 6 (six) hours as needed for severe pain (pain score 7-10). 16 tablet 0   No current facility-administered medications on file prior to visit.    Allergies:  Allergies  Allergen Reactions   Contrast Media [Iodinated Contrast Media] Swelling   Past Medical History:  Past Medical History:  Diagnosis Date   Allergy    Complication of anesthesia    pt reports he is starting to have more difficulty arousing after surgery   Diabetes mellitus (HCC)    GERD (gastroesophageal reflux disease)    Headache    History of kidney stones    Hyperlipidemia    Renal disorder    kidney stones   Past Surgical History:  Past Surgical History:  Procedure Laterality Date   APPLICATION OF CRANIAL NAVIGATION Right 01/17/2021   Procedure: APPLICATION OF CRANIAL NAVIGATION;  Surgeon: Bedelia Person, MD;  Location: [facility] OR;  Service: Neurosurgery;  Laterality: Right;   COLONOSCOPY  09/03/1994   COLONOSCOPY WITH PROPOFOL N/A 01/07/2023   Procedure: COLONOSCOPY WITH PROPOFOL;  Surgeon: [name], MD;  Location: [facility] ENDOSCOPY;  Service: Endoscopy;  Laterality: N/A;  NOT TOO EARLY   CYSTOSCOPY WITH STENT PLACEMENT Right 01/25/2016   Procedure: CYSTOSCOPY WITH STENT PLACEMENT;  Surgeon: Hildred Laser, MD;  Location: [facility] ORS;  Service: Urology;  Laterality: Right;   FRAMELESS  BIOPSY WITH BRAINLAB Right 01/17/2021   Procedure: RIGHT STEREOTACTIC BIOPSY OF INSULAR LESION;  Surgeon: Bedelia Person, MD;  Location: [facility] OR;  Service: Neurosurgery;  Laterality: Right;   KNEE ARTHROSCOPY W/ MENISCAL REPAIR Right 06/23/1988   POLYPECTOMY  01/07/2023   Procedure: POLYPECTOMY;  Surgeon: [name], MD;  Location: [facility] ENDOSCOPY;  Service: Endoscopy;;   removal of birthmark  06/08/2013   SKIN CANCER EXCISION  06/23/2012   TYMPANOSTOMY TUBE PLACEMENT  08/06/1978   URETEROSCOPY WITH HOLMIUM LASER LITHOTRIPSY Right 01/25/2016   Procedure: URETEROSCOPY WITH HOLMIUM LASER LITHOTRIPSY;  Surgeon: Hildred Laser, MD;  Location: [facility] ORS;  Service: Urology;  Laterality: Right;   URETHRAL STRICTURE DILATATION     visual inspection of vocal cord     1973   WISDOM TOOTH EXTRACTION     Social History:  Social History   Socioeconomic History   Marital status: Married    Spouse name: Not on file   Number of children: Not on file   Years of education: Not on file   Highest education level: Not on file  Occupational History   Not on file  Tobacco Use   Smoking status: Never   Smokeless tobacco: Never  Vaping Use   Vaping status: Never Used  Substance and Sexual Activity   Alcohol use: Not Currently    Comment: rare   Drug use: No   Sexual activity: Not on file  Other Topics Concern   Not on file  Social History Narrative   Not on file   Social Determinants of Health   Financial Resource Strain: Low Risk  (12/05/2022)   Received from [facility], [facility]   Overall Financial Resource Strain (CARDIA)    Difficulty of Paying Living Expenses: Not hard at all  Food Insecurity: No Food Insecurity (12/05/2022)   Received from [facility], [facility]   Hunger Vital Sign    Worried About Running Out of Food in  the Last Year: Never true    Ran Out of Food in the Last Year: Never true  Transportation Needs: No Transportation Needs (12/05/2022)   Received from [facility], [facility]   [facility] - Transportation    In the past 12 months, has lack of transportation kept you from medical appointments or from getting medications?: No    Lack of Transportation (Non-Medical): No  Physical Activity: Not on file  Stress: Not on file  Social Connections: Not on file  Intimate Partner Violence: Not on file   Family History:  Family History  Problem Relation Age of Onset   Hyperlipidemia Father    Hypertension Father    Diabetes Father    Benign prostatic hyperplasia Father    Stroke Maternal Grandmother    Diabetes Maternal Grandmother    Stroke Paternal Grandmother    Hypertension Paternal Grandmother    Kidney disease Neg Hx    Prostate cancer Neg Hx     Review of Systems: Constitutional: Doesn't report fevers, chills  or abnormal weight loss Eyes: Doesn't report blurriness of vision Ears, nose, mouth, throat, and face: Doesn't report sore throat Respiratory: Doesn't report cough, dyspnea or wheezes Cardiovascular: Doesn't report palpitation, chest discomfort  Gastrointestinal:  Doesn't report nausea, constipation, diarrhea GU: Doesn't report incontinence Skin: Doesn't report skin rashes Neurological: Per HPI Musculoskeletal: Doesn't report joint pain Behavioral/Psych: Doesn't report anxiety  Physical Exam: Vitals:   04/23/23 1344  BP: (!) 160/85  Pulse: 90  Resp: 18  Temp: (!) 97.3 F (36.3 C)  SpO2: 98%   KPS: 90. General: Fatigued Head: Normal EENT: No conjunctival injection or scleral icterus.  Lungs: Resp effort normal Cardiac: Regular rate Abdomen: Non-distended abdomen Skin: No rashes cyanosis or petechiae. Extremities: No clubbing or edema  Neurologic Exam: Mental Status: Awake, alert, attentive to examiner. Oriented to self and  environment. Language is fluent with intact comprehension.  Modest psychomotor slowing, impaired 3 object recall Cranial Nerves: Visual acuity is grossly normal. Visual fields are full. Extra-ocular movements intact. No ptosis. Face is symmetric Motor: Tone and bulk are normal. Power is full in both arms and legs. Reflexes are symmetric, no pathologic reflexes present.  Sensory: Intact to light touch Gait: Normal.   Labs: I have reviewed the data as listed    Component Value Date/Time   NA 138 11/14/2022 1025   NA 138 05/04/2012 1815   K 4.2 11/14/2022 1025   K 3.5 05/04/2012 1815   CL 100 11/14/2022 1025   CL 99 05/04/2012 1815   CO2 29 11/14/2022 1025   CO2 29 05/04/2012 1815   GLUCOSE 168 (H) 11/14/2022 1025   GLUCOSE 69 05/04/2012 1815   BUN 14 11/14/2022 1025   BUN 12 05/04/2012 1815   CREATININE 0.90 11/14/2022 1025   CALCIUM 9.6 11/14/2022 1025   CALCIUM 9.2 05/04/2012 1815   PROT 7.0 11/14/2022 1025   PROT 8.5 (H) 05/04/2012 1815   ALBUMIN 4.5 03/11/2022 1130   ALBUMIN 4.5 05/04/2012 1815   AST 16 11/14/2022 1025   AST 20 03/11/2022 1130   ALT 23 11/14/2022 1025   ALT 38 03/11/2022 1130   ALT 77 05/04/2012 1815   ALKPHOS 107 03/11/2022 1130   ALKPHOS 129 05/04/2012 1815   BILITOT 0.6 11/14/2022 1025   BILITOT 0.7 03/11/2022 1130   GFRNONAA >60 03/11/2022 1130   GFRNONAA 106 12/05/2020 1014   GFRAA 123 12/05/2020 1014   Lab Results  Component Value Date   WBC 7.3 04/23/2023   NEUTROABS 5.1 04/23/2023   HGB 15.5 04/23/2023   HCT 44.0 04/23/2023   MCV 82.7 04/23/2023   PLT 194 04/23/2023     Pathology report from June 2024 biopsy: A-B. Brain tissue, right frontal lesion, biopsy:   Brain tissue with changes consistent with treatment effect; see comment.       Comment: There is no definitive morphologic, immunophenotypic, or molecular evidence of the patient's known oligodendroglioma, IDH-mutant and 1p/19q-codeleted; see comment. The specimen is  negative for mutations in the TERT promoter and in IDH1/2 by Sanger sequencing.       Reviewed by resident/fellow Bernadette Hoit, GREGORY  Electronically signed by Ulanda Edison, MD on 12/23/2022     Assessment/Plan Focal seizures (HCC)  Oligodendroglioma [facility]) - Plan: Clinic Appointment Request  Radiation therapy induced brain necrosis - Plan: Clinic Appointment Request  Alan Wise is clinically stable today, having completed cycle #2 of avastin 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  Recommended continuing with cycle #3 avastin 10mg /kg IV q3 weeks x3  cycles, followed by repeat MRI brain.  We reviewed side effects including hypertension, wound healing impairment.  Urine protein will be checked every other cycle in addition to CBC.  Informed consent was obtained verbally at bedside to proceed with antibody therapy.  Avastin should be held for the following:  ANC less than 500  Platelets less than 50,000  LFT or creatinine greater than 2x ULN  If clinical concerns/contraindications develop  Recommended continuing Lamictal 100mg  daily.  We ask that Alan Wise return to clinic in 3 weeks following MRI brain, for potential cycle #4 avastin, or sooner if needed.  All questions were answered. The patient knows to call the clinic with any problems, questions or concerns. No barriers to learning were detected.  The total time spent in the encounter was 30 minutes and more than 50% was on counseling and review of test results   Henreitta Leber, MD Medical Director of Neuro-Oncology [facility] 04/23/23 1:54 PM

## 2023-04-28 ENCOUNTER — Telehealth: Payer: Self-pay | Admitting: *Deleted

## 2023-04-28 NOTE — Telephone Encounter (Signed)
PC to patient, informed him of brain MRI appointment - 05/05/23 at [facility] at 10:00, he is to arrive at 9:30.  Also informed patient of upcoming appointments at [facility] on 05/17/23.  Patient verbalizes understanding.

## 2023-05-05 ENCOUNTER — Ambulatory Visit (HOSPITAL_COMMUNITY)
Admission: RE | Admit: 2023-05-05 | Discharge: 2023-05-05 | Disposition: A | Discharge status: Home / Self Care | Payer: BC Managed Care – PPO | Source: Ambulatory Visit | Attending: Internal Medicine | Admitting: Internal Medicine

## 2023-05-05 DIAGNOSIS — Y842 Radiological procedure and radiotherapy as the cause of abnormal reaction of the patient, or of later complication, without mention of misadventure at the time of the procedure: Secondary | ICD-10-CM | POA: Insufficient documentation

## 2023-05-05 DIAGNOSIS — I6789 Other cerebrovascular disease: Secondary | ICD-10-CM | POA: Insufficient documentation

## 2023-05-05 DIAGNOSIS — C719 Malignant neoplasm of brain, unspecified: Secondary | ICD-10-CM | POA: Insufficient documentation

## 2023-05-05 DIAGNOSIS — I6381 Other cerebral infarction due to occlusion or stenosis of small artery: Secondary | ICD-10-CM | POA: Insufficient documentation

## 2023-05-05 MED ORDER — GADOBUTROL 1 MMOL/ML IV SOLN
7.0000 mL | Freq: Once | INTRAVENOUS | Status: AC | PRN
  Administered 2023-05-05: 7 mL via INTRAVENOUS

## 2023-05-11 ENCOUNTER — Other Ambulatory Visit: Payer: Self-pay | Admitting: *Deleted

## 2023-05-12 ENCOUNTER — Other Ambulatory Visit (HOSPITAL_COMMUNITY): Payer: Self-pay

## 2023-05-13 ENCOUNTER — Emergency Department
Admission: EM | Admit: 2023-05-13 | Discharge: 2023-05-14 | Disposition: A | Discharge status: Home / Self Care | Payer: BC Managed Care – PPO | Attending: Emergency Medicine | Admitting: Emergency Medicine

## 2023-05-13 ENCOUNTER — Encounter (HOSPITAL_COMMUNITY): Payer: Self-pay

## 2023-05-13 ENCOUNTER — Emergency Department: Payer: BC Managed Care – PPO

## 2023-05-13 ENCOUNTER — Other Ambulatory Visit: Payer: Self-pay

## 2023-05-13 DIAGNOSIS — R569 Unspecified convulsions: Secondary | ICD-10-CM | POA: Insufficient documentation

## 2023-05-13 DIAGNOSIS — G9389 Other specified disorders of brain: Secondary | ICD-10-CM | POA: Insufficient documentation

## 2023-05-13 DIAGNOSIS — R519 Headache, unspecified: Secondary | ICD-10-CM | POA: Diagnosis present

## 2023-05-13 DIAGNOSIS — C719 Malignant neoplasm of brain, unspecified: Secondary | ICD-10-CM | POA: Insufficient documentation

## 2023-05-13 LAB — DIFFERENTIAL
Abs Immature Granulocytes: 0.02 10*3/uL (ref 0.00–0.07)
Basophils Absolute: 0.1 10*3/uL (ref 0.0–0.1)
Basophils Relative: 1 %
Eosinophils Absolute: 0.3 10*3/uL (ref 0.0–0.5)
Eosinophils Relative: 4 %
Immature Granulocytes: 0 %
Lymphocytes Relative: 20 %
Lymphs Abs: 1.6 10*3/uL (ref 0.7–4.0)
Monocytes Absolute: 0.5 10*3/uL (ref 0.1–1.0)
Monocytes Relative: 6 %
Neutro Abs: 5.4 10*3/uL (ref 1.7–7.7)
Neutrophils Relative %: 69 %

## 2023-05-13 LAB — CBC
HCT: 46.3 % (ref 39.0–52.0)
Hemoglobin: 16.2 g/dL (ref 13.0–17.0)
MCH: 29 pg (ref 26.0–34.0)
MCHC: 35 g/dL (ref 30.0–36.0)
MCV: 82.8 fL (ref 80.0–100.0)
Platelets: 232 10*3/uL (ref 150–400)
RBC: 5.59 MIL/uL (ref 4.22–5.81)
RDW: 12 % (ref 11.5–15.5)
WBC: 7.9 10*3/uL (ref 4.0–10.5)
nRBC: 0 % (ref 0.0–0.2)

## 2023-05-13 LAB — COMPREHENSIVE METABOLIC PANEL
ALT: 23 U/L (ref 0–44)
AST: 19 U/L (ref 15–41)
Albumin: 4.8 g/dL (ref 3.5–5.0)
Alkaline Phosphatase: 112 U/L (ref 38–126)
Anion gap: 12 (ref 5–15)
BUN: 14 mg/dL (ref 6–20)
CO2: 26 mmol/L (ref 22–32)
Calcium: 9.2 mg/dL (ref 8.9–10.3)
Chloride: 99 mmol/L (ref 98–111)
Creatinine, Ser: 0.88 mg/dL (ref 0.61–1.24)
GFR, Estimated: >60 mL/min (ref >60)
Glucose, Bld: 157 mg/dL — ABNORMAL HIGH (ref 70–99)
Potassium: 3.4 mmol/L — ABNORMAL LOW (ref 3.5–5.1)
Sodium: 137 mmol/L (ref 135–145)
Total Bilirubin: 0.5 mg/dL (ref <1.2)
Total Protein: 7.8 g/dL (ref 6.5–8.1)

## 2023-05-13 LAB — ETHANOL: Alcohol, Ethyl (B): <10 mg/dL (ref <10)

## 2023-05-13 LAB — PROTIME-INR
INR: 1 (ref 0.8–1.2)
Prothrombin Time: 13.3 s (ref 11.4–15.2)

## 2023-05-13 LAB — APTT: aPTT: 29 s (ref 24–36)

## 2023-05-13 MED ORDER — LORAZEPAM 2 MG/ML IJ SOLN: 2.0000 mg | Freq: Once | INTRAMUSCULAR | Status: AC

## 2023-05-13 MED ORDER — SODIUM CHLORIDE 0.9 % IV SOLN: 40.0000 mg/kg | Freq: Once | INTRAVENOUS | Status: DC

## 2023-05-13 MED ORDER — LEVETIRACETAM IN NACL 1500 MG/100ML IV SOLN
1500.0000 mg | Freq: Once | INTRAVENOUS | Status: DC
  Filled 2023-05-13: qty 100

## 2023-05-13 MED ORDER — SODIUM CHLORIDE 0.9 % IV SOLN
3000.0000 mg | Freq: Once | INTRAVENOUS | Status: AC
  Administered 2023-05-13: 3000 mg via INTRAVENOUS

## 2023-05-13 MED ORDER — LORAZEPAM 2 MG/ML IJ SOLN
INTRAMUSCULAR | Status: AC
  Administered 2023-05-13: 2 mg via INTRAVENOUS
  Filled 2023-05-13: qty 1

## 2023-05-13 MED ORDER — SODIUM CHLORIDE 0.9% FLUSH: 3.0000 mL | Freq: Once | INTRAVENOUS | Status: DC

## 2023-05-13 MED ORDER — LEVETIRACETAM IN NACL 1000 MG/100ML IV SOLN
INTRAVENOUS | Status: AC
  Filled 2023-05-13: qty 300

## 2023-05-13 NOTE — Progress Notes (Signed)
2115: Tele stroke cart activated at this time. EDP at bedside. Pt in CT at time of cart activation.   2126: TSP paged at this time.   2130: TSP on camera at this time.   2132: Pt back from CT at this time.   2138: Results of NCCT provided to TSP at this time.   2150: TSP and TSRN off camera at this time. TSP to follow up with EDP for plan of care.

## 2023-05-13 NOTE — ED Notes (Signed)
Pt was BIB via POV from home accompanied by wife [name]. This afternoon pt was in his usual state of health. Around 8 pm he began to complain of a headache. Took tylenol and went up to bed. After that pt was screaming in pain from the severity of the headache followed by 1 episode of vomiting, loss of speech, and loss of motor function. Pt has hx of seizures had not had a recent seizure. No recent medication change. Has been complaint.  Pt activated a code stroke on arrival to room by edp.

## 2023-05-13 NOTE — ED Notes (Signed)
Called [facility] to request transfer spoke to Rep : Tammy @ 2305 will page Neurology and call back

## 2023-05-13 NOTE — ED Notes (Signed)
Cbg 138 °

## 2023-05-13 NOTE — ED Notes (Signed)
Pt arriving to CT accompanied by this RN and MD

## 2023-05-13 NOTE — ED Notes (Signed)
Spoke with Tammy, from [facility] to 9:15 pm she activated the code stroke for the pt in room #3

## 2023-05-13 NOTE — ED Provider Notes (Signed)
[facility] Provider Note    Event Date/Time   First MD Initiated Contact with Patient 05/13/23 2117     (approximate)   History   Altered Mental Status   HPI  Alan Wise is a 52 y.o. male  with know brain cancer for 2 years s/p radiation and chemotherapy who comes in with sudden onset headache and AMS, aphasia around 8pm with full body weakness.  Wife at bedside  no prior history of events like this. Mild headache today but at 8pm it was severe.  Pt on keppra for seizure prophyalsix.    Physical Exam   Triage Vital Signs: ED Triage Vitals  Encounter Vitals Group     BP 05/13/23 2114 (!) 199/104     Systolic BP Percentile --      Diastolic BP Percentile --      Pulse Rate 05/13/23 2114 68     Resp 05/13/23 2114 19     Temp --      Temp src --      SpO2 05/13/23 2114 96 %     Weight 05/13/23 2114 155 lb 13.8 oz (70.7 kg)     Height --      Head Circumference --      Peak Flow --      Pain Score 05/13/23 2110 10     Pain Loc --      Pain Education --      Exclude from Growth Chart --     Most recent vital signs: Vitals:   05/13/23 2114  BP: (!) 199/104  Pulse: 68  Resp: 19  SpO2: 96%     General: Altered- opens eye and responds to commands when moved over to bed- wiggles both legs.  CV:  Good peripheral perfusion.  Resp:  Normal effort.  Abd:  No distention.  Other:     ED Results / Procedures / Treatments   Labs (all labs ordered are listed, but only abnormal results are displayed) Labs Reviewed  PROTIME-INR  APTT  CBC  DIFFERENTIAL  COMPREHENSIVE METABOLIC PANEL  ETHANOL  I-STAT CREATININE, ED  CBG MONITORING, ED     EKG  My interpretation of EKG:  Normal sinus rate 78 without any ST elevation or T wave inversions, normal intervals  RADIOLOGY I have reviewed the CT head personally interpreted no evidence intracranial hemorrhage  PROCEDURES:  Critical Care performed: Yes, see critical care procedure  note(s)  .Critical Care  Performed by: Concha Se, MD Authorized by: Concha Se, MD   Critical care provider statement:    Critical care time (minutes):  30   Critical care was necessary to treat or prevent imminent or life-threatening deterioration of the following conditions:  CNS failure or compromise   Critical care was time spent personally by me on the following activities:  Development of treatment plan with patient or surrogate, discussions with consultants, evaluation of patient's response to treatment, examination of patient, ordering and review of laboratory studies, ordering and review of radiographic studies, ordering and performing treatments and interventions, pulse oximetry, re-evaluation of patient's condition and review of old charts    MEDICATIONS ORDERED IN ED: Medications  sodium chloride flush (NS) 0.9 % injection 3 mL (has no administration in time range)     IMPRESSION / MDM / ASSESSMENT AND PLAN / ED COURSE  I reviewed the triage vital signs and the nursing notes.   Patient's presentation is most consistent with acute presentation with  potential threat to life or bodily function.   PT with sudden onset headache, neuro deficits with aphasia, unable to use arms.  Differential stroke, hemorrhagic stroke, SAH.   After the CT scan patient started having some left-sided shaking but then went into the right side.  He then was still able to respond and he was pointing to his mouth and I gave him a vomit bag and he started spitting up into it.  He then got back into the room and try getting very agitated.  He was nonverbal and was pointing to his mouth and breathing very heavily.  His oxygen level was 100% he has no evidence of swelling to suggest anaphylaxis.  The neurologist did was on the call and did evaluate patient as below  9:52 PM discussed with telehealth neurologist and patient thought to be having seizures due to his aphasia given where his mass is  located can sometimes cause issues with aphasia.  Patient was given 2 mg of Ativan 3 g of Keppra.  Patient appeared to stop seizing and is resting in the bed.  If patient comes back to baseline we can give additional dose of Lamictal 50 and patient has neurology appointment tomorrow.  If patient is not able to come back to baseline then we would recommend transfer to [facility]  Patient still not coming back to baseline.  I discussed with Dr. Amada Jupiter from [facility] who recommend stat EEG given he does still have a little bit of left arm tightness although he does withdrawal to pain and is protecting his airway at this time.  He also recommended giving another 1.5 of Keppra.  We use a nasal swab he turns his head.  He recommended transfer to ED given no beds available.  I discussed case with Dr. Wilkie Aye who did accept patient.  Patient appears stable upon leaving still no obious signs of seizure.  The arm is no longer tense and he still withdrawing to pain and moving around the patient is protecting his airway  The patient is on the cardiac monitor to evaluate for evidence of arrhythmia and/or significant heart rate changes.      FINAL CLINICAL IMPRESSION(S) / ED DIAGNOSES   Final diagnoses:  Seizure (HCC)  Brain mass     Rx / DC Orders   ED Discharge Orders     None        Note:  This document was prepared using Dragon voice recognition software and may include unintentional dictation errors.   Concha Se, MD 05/14/23 [phone]

## 2023-05-13 NOTE — ED Notes (Signed)
Pt arrives via wheelchair from main lobby accompanied by triage RN Rosalie Doctor

## 2023-05-13 NOTE — Consult Note (Signed)
[facility] TeleNeurology Consult Services   Patient Name:   Alan Wise, Alan Wise Date of Birth:   1970/08/18 Identification Number:   MRN - [id number] Date of Service:   05/13/2023 21:26:02  Diagnosis:       G40.89 - Other seizures  Impression:      Patient presented with concern of breakthrough seizures. Head CT shows no acute changes. Patient presentation is concern for breakthrough seizures with aphasia and left gaze deviation. I agree with ativan 2mg  IV stat and Keppra load 40mg  kg (3000mg ) IV stat  IF back to baseline would recommend to increase lamictal by 50mg  ok to follow up with primary neurologist.   Our recommendations are outlined below.  Recommendations:       Ativan 2mg        Keppra 3000mg  IV  Sign Out:       Discussed with Emergency Department Provider    ------------------------------------------------------------------------------  Advanced Imaging: Advanced Imaging Deferred because: Stroke not suspected with clinical presentation and exam  Metrics: Last Known Well: 05/13/2023 18:00:00 Dispatch Time: 05/13/2023 21:26:02 Arrival Time: 05/13/2023 21:00:00 Initial Response Time: 05/13/2023 21:29:00 Symptoms: AMS. Initial patient interaction: 05/13/2023 21:36:00 NIHSS Assessment Completed: 05/13/2023 21:41:00 Patient is not a candidate for Thrombolytic. Thrombolytic Medical Decision: 05/13/2023 21:41:25 Patient was not deemed candidate for Thrombolytic because of following reasons: History of previous intracranial hemorrhage, intracranial neoplasm .  I personally Reviewed the CT Head and it Showed Rt frontal tumor.  Primary Provider Notified of Diagnostic Impression and Management Plan on: 05/13/2023 21:50:32    ------------------------------------------------------------------------------  History of Present Illness: Patient is a 52 year old Male.  Patient was brought by private transportation with symptoms of AMS. Past medical history of  Oligodendroglioma on right frontal, Seizures presented with altered mental status. History was provided by the stroke RN. Last seen normal was around 1800. Patient was complaining of change in mentation. Generalized weakness.    Medications:  No Anticoagulant use  No Antiplatelet use Reviewed EMR for current medications  Allergies:  Reviewed  Social History: Drug Use: No  Family History:  There is no family history of premature cerebrovascular disease pertinent to this consultation  ROS : 14 Points Review of Systems was performed and was negative except mentioned in HPI.  Past Surgical History: There Is No Surgical History Contributory To Today's Visit     Examination: BP(199/104), Pulse(77), Blood Glucose(138) 1A: Level of Consciousness - Alert; keenly responsive + 0 1B: Ask Month and Age - Aphasic + 2 1C: Blink Eyes & Squeeze Hands - Performs 0 Tasks + 2 2: Test Horizontal Extraocular Movements - Forced Gaze Palsy: Cannot Be Overcome + 2 3: Test Visual Fields - No Visual Loss + 0 4: Test Facial Palsy (Use Grimace if Obtunded) - Normal symmetry + 0 5A: Test Left Arm Motor Drift - Drift, hits bed + 2 5B: Test Right Arm Motor Drift - Drift, hits bed + 2 6A: Test Left Leg Motor Drift - Drift, hits bed + 2 6B: Test Right Leg Motor Drift - Drift, hits bed + 2 7: Test Limb Ataxia (FNF/Heel-Shin) - No Ataxia + 0 8: Test Sensation - Normal; No sensory loss + 0 9: Test Language/Aphasia - Mute/Global Aphasia: No Usable Speech/Auditory Comprehension + 3 10: Test Dysarthria - Mute/Anarthric + 2 11: Test Extinction/Inattention - Visual/tactile/auditory/spatial/personal inattention + 1  NIHSS Score: 20   Pre-Morbid Modified Rankin Scale: 2 Points = Slight disability; unable to carry out all previous activities, but able to look after own affairs without  assistance  Spoke with : Dr Fuller Plan at bedside.  This consult was conducted in real time using interactive audio and Theatre manager. Patient was informed of the technology being used for this visit and agreed to proceed. Patient located in hospital and provider located at home/office setting.   Patient is being evaluated for possible acute neurologic impairment and high probability of imminent or life-threatening deterioration. I spent total of 30 minutes providing care to this patient, including time for face to face visit via telemedicine, review of medical records, imaging studies and discussion of findings with providers, the patient and/or family.   Dr Delene Ruffini Mc-ONeil Jamille Fisher   [facility] For Inpatient follow-up with [facility] physician please call [facility] at [phone]. As we are not an outpatient service for any post hospital discharge needs please contact the hospital for assistance. If you have any questions for the [facility] physicians or need to reconsult for clinical or diagnostic changes please contact us via [facility] at [phone].

## 2023-05-13 NOTE — ED Notes (Signed)
Code stroke activated ?

## 2023-05-13 NOTE — ED Notes (Signed)
Tammy from [facility] back with the radiologist and was transferred to Dr. Fuller Plan for consult by Rivka Barbara.

## 2023-05-13 NOTE — ED Notes (Signed)
Unable to complete ordered CT Angio head neck w wo CM for code stroke d/t concern for contrast allergy

## 2023-05-14 ENCOUNTER — Other Ambulatory Visit: Payer: Self-pay

## 2023-05-14 ENCOUNTER — Ambulatory Visit: Payer: BC Managed Care – PPO | Admitting: Internal Medicine

## 2023-05-14 ENCOUNTER — Observation Stay (HOSPITAL_COMMUNITY)
Admission: EM | Admit: 2023-05-14 | Discharge: 2023-05-15 | Disposition: A | Discharge status: Home / Self Care | Payer: BC Managed Care – PPO | Attending: Internal Medicine | Admitting: Internal Medicine

## 2023-05-14 ENCOUNTER — Inpatient Hospital Stay: Payer: BC Managed Care – PPO

## 2023-05-14 ENCOUNTER — Other Ambulatory Visit: Payer: BC Managed Care – PPO

## 2023-05-14 ENCOUNTER — Emergency Department (HOSPITAL_COMMUNITY): Payer: BC Managed Care – PPO

## 2023-05-14 ENCOUNTER — Encounter (HOSPITAL_COMMUNITY): Payer: Self-pay | Admitting: Emergency Medicine

## 2023-05-14 DIAGNOSIS — C719 Malignant neoplasm of brain, unspecified: Secondary | ICD-10-CM | POA: Diagnosis present

## 2023-05-14 DIAGNOSIS — G40901 Epilepsy, unspecified, not intractable, with status epilepticus: Secondary | ICD-10-CM | POA: Diagnosis present

## 2023-05-14 DIAGNOSIS — R519 Headache, unspecified: Secondary | ICD-10-CM

## 2023-05-14 DIAGNOSIS — E119 Type 2 diabetes mellitus without complications: Secondary | ICD-10-CM

## 2023-05-14 DIAGNOSIS — R569 Unspecified convulsions: Principal | ICD-10-CM

## 2023-05-14 LAB — HIV ANTIBODY (ROUTINE TESTING W REFLEX): HIV Screen 4th Generation wRfx: NONREACTIVE

## 2023-05-14 LAB — CBG MONITORING, ED
Glucose-Capillary: 138 mg/dL — ABNORMAL HIGH (ref 70–99)
Glucose-Capillary: 124 mg/dL — ABNORMAL HIGH (ref 70–99)
Glucose-Capillary: 111 mg/dL — ABNORMAL HIGH (ref 70–99)

## 2023-05-14 LAB — GLUCOSE, CAPILLARY
Glucose-Capillary: 113 mg/dL — ABNORMAL HIGH (ref 70–99)
Glucose-Capillary: 162 mg/dL — ABNORMAL HIGH (ref 70–99)
Glucose-Capillary: 90 mg/dL (ref 70–99)

## 2023-05-14 MED ORDER — ACETAMINOPHEN 650 MG RE SUPP: 650.0000 mg | Freq: Four times a day (QID) | RECTAL | Status: DC | PRN

## 2023-05-14 MED ORDER — ONDANSETRON HCL 4 MG/2ML IJ SOLN: 4.0000 mg | Freq: Four times a day (QID) | INTRAMUSCULAR | Status: DC | PRN

## 2023-05-14 MED ORDER — ORAL CARE MOUTH RINSE: 15.0000 mL | OROMUCOSAL | Status: DC | PRN

## 2023-05-14 MED ORDER — ONDANSETRON HCL 4 MG PO TABS: 4.0000 mg | ORAL_TABLET | Freq: Four times a day (QID) | ORAL | Status: DC | PRN

## 2023-05-14 MED ORDER — SODIUM CHLORIDE 0.9 % IV SOLN
100.0000 mg | Freq: Two times a day (BID) | INTRAVENOUS | Status: DC
  Administered 2023-05-14 – 2023-05-15 (×4): 100 mg via INTRAVENOUS
  Filled 2023-05-14 (×6): qty 10

## 2023-05-14 MED ORDER — INSULIN ASPART 100 UNIT/ML IJ SOLN
0.0000 [IU] | INTRAMUSCULAR | Status: DC
  Administered 2023-05-14: 2 [IU] via SUBCUTANEOUS
  Administered 2023-05-15: 1 [IU] via SUBCUTANEOUS

## 2023-05-14 MED ORDER — ACETAMINOPHEN 325 MG PO TABS: 650.0000 mg | ORAL_TABLET | Freq: Four times a day (QID) | ORAL | Status: DC | PRN

## 2023-05-14 MED ORDER — LAMOTRIGINE 100 MG PO TABS
100.0000 mg | ORAL_TABLET | Freq: Every day | ORAL | Status: DC
  Administered 2023-05-15: 100 mg via ORAL
  Filled 2023-05-14: qty 1

## 2023-05-14 MED ORDER — ORAL CARE MOUTH RINSE
15.0000 mL | OROMUCOSAL | Status: DC
  Administered 2023-05-14 – 2023-05-15 (×10): 15 mL via OROMUCOSAL

## 2023-05-14 MED ORDER — LAMOTRIGINE 25 MG PO TABS
50.0000 mg | ORAL_TABLET | Freq: Every day | ORAL | Status: DC
  Administered 2023-05-14: 50 mg via ORAL
  Filled 2023-05-14: qty 2

## 2023-05-14 NOTE — ED Notes (Signed)
Pt is sleeping and hard to arouse. Took blood glucose of 124. Held insulin. Will recheck blood glucose.

## 2023-05-14 NOTE — Consult Note (Signed)
[facility] Neuro-Oncology Consult Note  Patient Care Team: Lorre Munroe, NP as PCP - General (Internal Medicine)  CHIEF COMPLAINTS/PURPOSE OF CONSULTATION:  High Grade Glioma Breakthrough Seizures  HISTORY OF PRESENTING ILLNESS:  Alan Wise 52 y.o. male presented with breakthrough seizure at home.  Wife describes severe headache, nausea/vomiting, then "body going limp, shaking all over".  He had additional seizure acitivity in the ED, was loaded with Keppra and given IV Ativan.  Currently on long term EEG, remains somnolent.    MEDICAL HISTORY:  Past Medical History:  Diagnosis Date   Allergy    Complication of anesthesia    pt reports he is starting to have more difficulty arousing after surgery   Diabetes mellitus (HCC)    GERD (gastroesophageal reflux disease)    Headache    History of kidney stones    Hyperlipidemia    Renal disorder    kidney stones    SURGICAL HISTORY: Past Surgical History:  Procedure Laterality Date   APPLICATION OF CRANIAL NAVIGATION Right 01/17/2021   Procedure: APPLICATION OF CRANIAL NAVIGATION;  Surgeon: Bedelia Person, MD;  Location: [facility] OR;  Service: Neurosurgery;  Laterality: Right;   COLONOSCOPY  09/03/1994   COLONOSCOPY WITH PROPOFOL N/A 01/07/2023   Procedure: COLONOSCOPY WITH PROPOFOL;  Surgeon: [name], MD;  Location: [facility] ENDOSCOPY;  Service: Endoscopy;  Laterality: N/A;  NOT TOO EARLY   CYSTOSCOPY WITH STENT PLACEMENT Right 01/25/2016   Procedure: CYSTOSCOPY WITH STENT PLACEMENT;  Surgeon: Hildred Laser, MD;  Location: [facility] ORS;  Service: Urology;  Laterality: Right;   FRAMELESS  BIOPSY WITH BRAINLAB Right 01/17/2021   Procedure: RIGHT STEREOTACTIC BIOPSY OF INSULAR LESION;  Surgeon: Bedelia Person, MD;  [facility] OR;  Service: Neurosurgery;  Laterality: Right;   KNEE ARTHROSCOPY W/ MENISCAL REPAIR Right 06/23/1988   POLYPECTOMY  01/07/2023   Procedure: POLYPECTOMY;  Surgeon: [name], MD;   Location: [facility] ENDOSCOPY;  Service: Endoscopy;;   removal of birthmark  06/08/2013   SKIN CANCER EXCISION  06/23/2012   TYMPANOSTOMY TUBE PLACEMENT  08/06/1978   URETEROSCOPY WITH HOLMIUM LASER LITHOTRIPSY Right 01/25/2016   Procedure: URETEROSCOPY WITH HOLMIUM LASER LITHOTRIPSY;  Surgeon: Hildred Laser, MD;  Location: [facility] ORS;  Service: Urology;  Laterality: Right;   URETHRAL STRICTURE DILATATION     visual inspection of vocal cord     1973   WISDOM TOOTH EXTRACTION      SOCIAL HISTORY: Social History   Socioeconomic History   Marital status: Married    Spouse name: Not on file   Number of children: Not on file   Years of education: Not on file   Highest education level: Not on file  Occupational History   Not on file  Tobacco Use   Smoking status: Never   Smokeless tobacco: Never  Vaping Use   Vaping status: Never Used  Substance and Sexual Activity   Alcohol use: Not Currently    Comment: rare   Drug use: No   Sexual activity: Not on file  Other Topics Concern   Not on file  Social History Narrative   Not on file   Social Determinants of Health   Financial Resource Strain: Low Risk  (12/05/2022)   Received from [facility]   Overall Financial Resource Strain (CARDIA)    Difficulty of Paying Living Expenses: Not hard at all  Food Insecurity: No Food Insecurity (05/14/2023)   Hunger Vital Sign  Worried About Programme researcher, broadcasting/film/video in the Last Year: Never true    Ran Out of Food in the Last Year: Never true  Transportation Needs: No Transportation Needs (05/14/2023)   PRAPARE - Administrator, Civil Service (Medical): No    Lack of Transportation (Non-Medical): No  Physical Activity: Not on file  Stress: Not on file  Social Connections: Not on file  Intimate Partner Violence: Patient Unable To Answer (05/14/2023)   Humiliation, Afraid, Rape, and Kick questionnaire    Fear of Current or Ex-Partner:  Patient unable to answer    Emotionally Abused: Patient unable to answer    Physically Abused: Patient unable to answer    Sexually Abused: Patient unable to answer    FAMILY HISTORY: Family History  Problem Relation Age of Onset   Hyperlipidemia Father    Hypertension Father    Diabetes Father    Benign prostatic hyperplasia Father    Stroke Maternal Grandmother    Diabetes Maternal Grandmother    Stroke Paternal Grandmother    Hypertension Paternal Grandmother    Kidney disease Neg Hx    Prostate cancer Neg Hx     ALLERGIES:  is allergic to contrast media [iodinated contrast media].  MEDICATIONS:  Current Facility-Administered Medications  Medication Dose Route Frequency Provider Last Rate Last Admin   acetaminophen (TYLENOL) tablet 650 mg  650 mg Oral Q6H PRN Hillary Bow, DO       Or   acetaminophen (TYLENOL) suppository 650 mg  650 mg Rectal Q6H PRN Hillary Bow, DO       insulin aspart (novoLOG) injection 0-9 Units  0-9 Units Subcutaneous Q4H Julian Reil, Jared M, DO       lacosamide (VIMPAT) 100 mg in sodium chloride 0.9 % 25 mL IVPB  100 mg Intravenous Q12H Rejeana Brock, MD   Stopped at 05/14/23 1046   lamoTRIgine (LAMICTAL) tablet 100 mg  100 mg Oral Daily Rejeana Brock, MD       lamoTRIgine (LAMICTAL) tablet 50 mg  50 mg Oral Daily Rejeana Brock, MD       ondansetron [facility]) tablet 4 mg  4 mg Oral Q6H PRN Hillary Bow, DO       Or   ondansetron [facility]) injection 4 mg  4 mg Intravenous Q6H PRN Hillary Bow, DO       Oral care mouth rinse  15 mL Mouth Rinse Q2H Julian Reil, Jared M, DO   15 mL at 05/14/23 1204   Oral care mouth rinse  15 mL Mouth Rinse PRN Hillary Bow, DO        REVIEW OF SYSTEMS:   Constitutional: Denies fevers, chills or abnormal weight loss Eyes: Denies blurriness of vision Ears, nose, mouth, throat, and face: Denies mucositis or sore throat Respiratory: Denies cough, dyspnea or wheezes Cardiovascular:  Denies palpitation, chest discomfort or lower extremity swelling Gastrointestinal:  Denies nausea, constipation, diarrhea GU: Denies dysuria or incontinence Skin: Denies abnormal skin rashes Neurological: Per HPI Musculoskeletal: Denies joint pain, back or neck discomfort. No decrease in ROM Behavioral/Psych: Denies anxiety, disturbance in thought content, and mood instability   PHYSICAL EXAMINATION: Vitals:   05/14/23 1330 05/14/23 1415  BP:  (!) 168/100  Pulse:  77  Resp:  19  Temp: 99.2 F (37.3 C) 98 F (36.7 C)  SpO2:  96%   KPS: 60. General: Sleeping Head: Normal EENT: No conjunctival injection or scleral icterus. Oral mucosa moist Lungs: Resp effort  normal Cardiac: Regular rate and rhythm Abdomen: Soft, non-distended abdomen Skin: No rashes cyanosis or petechiae. Extremities: No clubbing or edema  NEUROLOGIC EXAM: Mental Status: Not awake, arouses to tactile stim.  Doesn't maintain alertness, not attentive to examiner.  Cranial Nerves: Extra-ocular movements intact. No ptosis. Face is symmetric, tongue midline. Motor: Tone and bulk are normal. Power is antigravity in both arms and legs. Reflexes are symmetric, no pathologic reflexes present. Intact finger to nose bilaterally Sensory: Intact to light touch and temperature Gait: Deferred   LABORATORY DATA:  I have reviewed the data as listed Lab Results  Component Value Date   WBC 7.9 05/13/2023   HGB 16.2 05/13/2023   HCT 46.3 05/13/2023   MCV 82.8 05/13/2023   PLT 232 05/13/2023   Recent Labs    08/01/22 0940 11/14/22 1025 05/13/23 2116  NA 138 138 137  K 4.7 4.2 3.4*  CL 100 100 99  CO2 29 29 26   GLUCOSE 246* 168* 157*  BUN 12 14 14   CREATININE 0.86 0.90 0.88  CALCIUM 9.3 9.6 9.2  GFRNONAA  --   --  >60  PROT 6.9 7.0 7.8  ALBUMIN  --   --  4.8  AST 14 16 19   ALT 23 23 23   ALKPHOS  --   --  112  BILITOT 0.6 0.6 0.5    RADIOGRAPHIC STUDIES: I have personally reviewed the radiological images  as listed and agreed with the findings in the report. Overnight EEG with video  Result Date: 05/14/2023 Charlsie Quest, MD     05/14/2023  8:54 AM Patient Name: Alan Wise MRN: [id number] Epilepsy Attending: Charlsie Quest Referring Physician/Provider: Rejeana Brock, MD Duration: 05/14/2023 0257 to 0845 Patient history: 52 yo M with breakthrough seizure in the setting of a known tumor. EEG to evaluate for seizure Level of alertness: Awake, asleep AEDs during EEG study: LCM, LTG Technical aspects: This EEG study was done with scalp electrodes positioned according to the 10-20 International system of electrode placement. Electrical activity was reviewed with band pass filter of 1-70Hz , sensitivity of 7 uV/mm, display speed of 61mm/sec with a 60Hz  notched filter applied as appropriate. EEG data were recorded continuously and digitally stored.  Video monitoring was available and reviewed as appropriate. Description: The posterior dominant rhythm consists of 9-10 Hz activity of moderate voltage (25-35 uV) seen predominantly in posterior head regions, symmetric and reactive to eye opening and eye closing. Sleep was characterized by vertex waves, sleep spindles (12 to 14 Hz), maximal frontocentral region. EEG showed continuous 3 to 6 Hz theta-delta slowing in right temporal region. Hyperventilation and photic stimulation were not performed.   ABNORMALITY - Continuous slow, right temporal region IMPRESSION: This study is suggestive of cortical dysfunction arising from right temporal region likely secondary to underlying tumor. No seizures or epileptiform discharges were seen throughout the recording. Charlsie Quest   CT HEAD CODE STROKE WO CONTRAST  Result Date: 05/13/2023 CLINICAL DATA:  Code stroke.  Aphasia EXAM: CT HEAD WITHOUT CONTRAST TECHNIQUE: Contiguous axial images were obtained from the base of the skull through the vertex without intravenous contrast. RADIATION DOSE REDUCTION: This  exam was performed according to the departmental dose-optimization program which includes automated exposure control, adjustment of the mA and/or kV according to patient size and/or use of iterative reconstruction technique. COMPARISON:  Brain MRI 05/05/2023 FINDINGS: Brain: No hemorrhage. No hydrocephalus. No extra-axial fluid collection. Redemonstrated postsurgical changes in the right frontal lobe, which are better assessed  on recent prior brain MRI dated 05/05/2023. No CT evidence of an acute cortical infarct. Vascular: No hyperdense vessel or unexpected calcification. Skull: Normal. Negative for fracture or focal lesion. Sinuses/Orbits: No middle ear or mastoid effusion. Paranasal sinuses are clear. Orbits are unremarkable. Other: None. ASPECTS [facility] Stroke Program Early CT Score): 10 when accounting for chronic findings IMPRESSION: 1. No hemorrhage or CT evidence of an acute cortical infarct. 2. Redemonstrated post-treatment changes in the right frontal lobe, which are better assessed on recent prior brain MRI dated 05/05/2023. Electronically Signed   By: Lorenza Cambridge M.D.   On: 05/13/2023 21:34   MR BRAIN W WO CONTRAST  Result Date: 05/05/2023 CLINICAL DATA:  Brain/CNS neoplasm. Assess treatment response. Oligodendroglioma. Radiation therapy induced brain necrosis. EXAM: MRI HEAD WITHOUT AND WITH CONTRAST TECHNIQUE: Multiplanar, multiecho pulse sequences of the brain and surrounding structures were obtained without and with intravenous contrast. CONTRAST:  7mL GADAVIST GADOBUTROL 1 MMOL/ML IV SOLN COMPARISON:  02/19/2023.  01/09/2023. FINDINGS: Brain: Old small vessel ischemic changes are seen within the pons. The previously seen abnormality in the left pons represented a small vessel stroke. No sign of tumor in the pons or cerebellum. Considerable reduction in abnormal T2 and FLAIR signal with mass effect in the anterior aspect of the right cerebral hemisphere compared to the prior study. Abnormal  T2 and FLAIR signal crossing within the genu of the corpus callosum is diminished. Mass-effect is diminish with less flattening of the right lateral ventricle and resolution of right to left midline shift. Foci of hemosiderin deposition related to previous treatment are unchanged. Previously seen abnormal enhancement within the right temporal lobe/deep insula is markedly reduced, only a punctate focus of enhancement there presently. Marked reduction an abnormal enhancement in the right frontal lobe. Resolution of abnormal enhancement within the genu of the corpus callosum. Resolution of the previously noted abnormal enhancement in the inferior right frontal lobe. These findings suggest that much of what we were seeing previously was treatment effect. No evidence of new or worsening finding. Vascular: Major vessels at the base of the brain show flow. Skull and upper cervical spine: Negative Sinuses/Orbits: Clear/normal Other: None IMPRESSION: 1. Considerable reduction in abnormal T2 and FLAIR signal/ mass effect in the anterior aspect of the right cerebral hemisphere compared to the prior study. Marked reduction in abnormal enhancement on the right. These findings suggest that much of what we were seeing previously was treatment effect. No evidence of new or worsening finding. 2. Old small vessel ischemic changes within the pons. The previously seen abnormality in the left pons represented a small vessel stroke. No sign of tumor in the pons or cerebellum. Electronically Signed   By: Paulina Fusi M.D.   On: 05/05/2023 11:24    ASSESSMENT & PLAN:  Oligodendroglioma Radiation Necrosis Focal Seizures  Alan Wise presents with breakthrough focal seizures, post ictal encephalopathy.  No clear provocation, although episode of severe headache with vomiting could be as such.    His MRI demonstrates remarkable improvement in enhancement and T2/FLAIR burden following 3 cycles of avastin for unusual pattern of  radionecrosis.   We will reschedule his next infusion which was due for today, will probably transition to q4 weeks regardless.  Agree with AED management from inpatient neurology team.  Will continue to adjust doses from outpatient setting once stabilized.   Spoke with his wife and communicated MRI results and plan moving forward.   All questions were answered. The patient knows to call the clinic  with any problems, questions or concerns.  The total time spent in the encounter was 60 minutes and more than 50% was on counseling and review of test results     Henreitta Leber, MD 05/14/2023 4:10 PM

## 2023-05-14 NOTE — Progress Notes (Signed)
Initial EEG reviewed, no ongoing seizure activity.   Ritta Slot, MD [facility] [phone]  If 7pm- 7am, please page neurology on call as listed in AMION.

## 2023-05-14 NOTE — ED Notes (Signed)
ED TO INPATIENT HANDOFF REPORT  ED Nurse Name and Phone #: Vernona Rieger [phone]  S Name/Age/Gender Alan Wise 52 y.o. male Room/Bed: 007C/007C  Code Status   Code Status: Full Code  Home/SNF/Other Home Patient oriented to: self Is this baseline? No   Triage Complete: Triage complete  Chief Complaint Status epilepticus (HCC) [G40.901]  Triage Note Pt transferred from [facility] ED for neurology consultation. Pt received 1.5 grams keppra IV by [facility] during transport. History of brain tumor. Wife at bedside.   Allergies Allergies  Allergen Reactions   Contrast Media [Iodinated Contrast Media] Swelling    Level of Care/Admitting Diagnosis ED Disposition     ED Disposition  Admit   Condition  --   Comment  Hospital Area: [facility] [[id number]]  Level of Care: Progressive [102]  Admit to Progressive based on following criteria: NEUROLOGICAL AND NEUROSURGICAL complex patients with significant risk of instability, who do not meet ICU criteria, yet require close observation or frequent assessment (< / = every 2 - 4 hours) with medical / nursing intervention.  May admit patient to [facility] or [facility] if equivalent level of care is available:: No  Covid Evaluation: Asymptomatic - no recent exposure (last 10 days) testing not required  Diagnosis: Status epilepticus [facility]) [[id number]]  Admitting Physician: Wyvonnia Dusky  Attending Physician: Hillary Bow [phone]  Certification:: I certify this patient will need inpatient services for at least 2 midnights  Expected Medical Readiness: 05/16/2023          B Medical/Surgery History Past Medical History:  Diagnosis Date   Allergy    Complication of anesthesia    pt reports he is starting to have more difficulty arousing after surgery   Diabetes mellitus (HCC)    GERD (gastroesophageal reflux disease)    Headache    History of kidney stones    Hyperlipidemia    Renal disorder    kidney  stones   Past Surgical History:  Procedure Laterality Date   APPLICATION OF CRANIAL NAVIGATION Right 01/17/2021   Procedure: APPLICATION OF CRANIAL NAVIGATION;  Surgeon: Bedelia Person, MD;  Location[facility] OR;  Service: Neurosurgery;  Laterality: Right;   COLONOSCOPY  09/03/1994   COLONOSCOPY WITH PROPOFOL N/A 01/07/2023   Procedure: COLONOSCOPY WITH PROPOFOL;  Surgeon: [name], MD;  Location: [facility] ENDOSCOPY;  Service: Endoscopy;  Laterality: N/A;  NOT TOO EARLY   CYSTOSCOPY WITH STENT PLACEMENT Right 01/25/2016   Procedure: CYSTOSCOPY WITH STENT PLACEMENT;  Surgeon: Hildred Laser, MD;  Location: [facility] ORS;  Service: Urology;  Laterality: Right;   FRAMELESS  BIOPSY WITH BRAINLAB Right 01/17/2021   Procedure: RIGHT STEREOTACTIC BIOPSY OF INSULAR LESION;  Surgeon: Bedelia Person, MD;  Location: [facility] OR;  Service: Neurosurgery;  Laterality: Right;   KNEE ARTHROSCOPY W/ MENISCAL REPAIR Right 06/23/1988   POLYPECTOMY  01/07/2023   Procedure: POLYPECTOMY;  Surgeon: [name], MD;  Location: [facility] ENDOSCOPY;  Service: Endoscopy;;   removal of birthmark  06/08/2013   SKIN CANCER EXCISION  06/23/2012   TYMPANOSTOMY TUBE PLACEMENT  08/06/1978   URETEROSCOPY WITH HOLMIUM LASER LITHOTRIPSY Right 01/25/2016   Procedure: URETEROSCOPY WITH HOLMIUM LASER LITHOTRIPSY;  Surgeon: Hildred Laser, MD;  Location: [facility] ORS;  Service: Urology;  Laterality: Right;   URETHRAL STRICTURE DILATATION     visual inspection of vocal cord     1973   WISDOM TOOTH EXTRACTION       A IV Location/Drains/Wounds Patient Lines/Drains/Airways Status  Active Line/Drains/Airways     Name Placement date Placement time Site Days   Peripheral IV 05/13/23 18 G Right Antecubital 05/13/23  2121  Antecubital  1   Peripheral IV 05/13/23 20 G Right Hand 05/13/23  2145  Hand  1   Ureteral Drain/Stent Right ureter 6 Fr. 01/25/16  1329  Right ureter  2666            Intake/Output Last 24  hours  Intake/Output Summary (Last 24 hours) at 05/14/2023 1213 Last data filed at 05/14/2023 1046 Gross per 24 hour  Intake 25 ml  Output 150 ml  Net -125 ml    Labs/Imaging Results for orders placed or performed during the hospital encounter of 05/14/23 (from the past 48 hour(s))  CBG monitoring, ED     Status: Abnormal   Collection Time: 05/14/23  8:10 AM  Result Value Ref Range   Glucose-Capillary 124 (H) 70 - 99 mg/dL    Comment: Glucose reference range applies only to samples taken after fasting for at least 8 hours.   Comment 1 Notify RN   CBG monitoring, ED     Status: Abnormal   Collection Time: 05/14/23 12:10 PM  Result Value Ref Range   Glucose-Capillary 111 (H) 70 - 99 mg/dL    Comment: Glucose reference range applies only to samples taken after fasting for at least 8 hours.   Comment 1 Notify RN    Overnight EEG with video  Result Date: 05/14/2023 Charlsie Quest, MD     05/14/2023  8:54 AM Patient Name: Alan Wise MRN: [id number] Epilepsy Attending: Charlsie Quest Referring Physician/Provider: Rejeana Brock, MD Duration: 05/14/2023 0257 to 0845 Patient history: 52 yo M with breakthrough seizure in the setting of a known tumor. EEG to evaluate for seizure Level of alertness: Awake, asleep AEDs during EEG study: LCM, LTG Technical aspects: This EEG study was done with scalp electrodes positioned according to the 10-20 International system of electrode placement. Electrical activity was reviewed with band pass filter of 1-70Hz , sensitivity of 7 uV/mm, display speed of 24mm/sec with a 60Hz  notched filter applied as appropriate. EEG data were recorded continuously and digitally stored.  Video monitoring was available and reviewed as appropriate. Description: The posterior dominant rhythm consists of 9-10 Hz activity of moderate voltage (25-35 uV) seen predominantly in posterior head regions, symmetric and reactive to eye opening and eye closing. Sleep was  characterized by vertex waves, sleep spindles (12 to 14 Hz), maximal frontocentral region. EEG showed continuous 3 to 6 Hz theta-delta slowing in right temporal region. Hyperventilation and photic stimulation were not performed.   ABNORMALITY - Continuous slow, right temporal region IMPRESSION: This study is suggestive of cortical dysfunction arising from right temporal region likely secondary to underlying tumor. No seizures or epileptiform discharges were seen throughout the recording. Charlsie Quest   CT HEAD CODE STROKE WO CONTRAST  Result Date: 05/13/2023 CLINICAL DATA:  Code stroke.  Aphasia EXAM: CT HEAD WITHOUT CONTRAST TECHNIQUE: Contiguous axial images were obtained from the base of the skull through the vertex without intravenous contrast. RADIATION DOSE REDUCTION: This exam was performed according to the departmental dose-optimization program which includes automated exposure control, adjustment of the mA and/or kV according to patient size and/or use of iterative reconstruction technique. COMPARISON:  Brain MRI 05/05/2023 FINDINGS: Brain: No hemorrhage. No hydrocephalus. No extra-axial fluid collection. Redemonstrated postsurgical changes in the right frontal lobe, which are better assessed on recent prior brain MRI dated 05/05/2023. No  CT evidence of an acute cortical infarct. Vascular: No hyperdense vessel or unexpected calcification. Skull: Normal. Negative for fracture or focal lesion. Sinuses/Orbits: No middle ear or mastoid effusion. Paranasal sinuses are clear. Orbits are unremarkable. Other: None. ASPECTS [facility] Stroke Program Early CT Score): 10 when accounting for chronic findings IMPRESSION: 1. No hemorrhage or CT evidence of an acute cortical infarct. 2. Redemonstrated post-treatment changes in the right frontal lobe, which are better assessed on recent prior brain MRI dated 05/05/2023. Electronically Signed   By: Lorenza Cambridge M.D.   On: 05/13/2023 21:34    Pending  Labs Unresulted Labs (From admission, onward)     Start     Ordered   05/15/23 0500  CBC  Tomorrow morning,   R        05/14/23 0612   05/15/23 0500  Comprehensive metabolic panel  Tomorrow morning,   R        05/14/23 0612   05/14/23 0608  HIV Antibody (routine testing w rflx)  (HIV Antibody (Routine testing w reflex) panel)  Once,   R        05/14/23 0612   05/14/23 0152  Lamotrigine level  Once,   URGENT        05/14/23 0151            Vitals/Pain Today's Vitals   05/14/23 1130 05/14/23 1145 05/14/23 1200 05/14/23 1210  BP: (!) 156/104 (!) 161/97 (!) 173/85   Pulse: 70 78 69   Resp:      Temp:      TempSrc:      SpO2: 98% 98% 96%   Weight:    70.7 kg  Height:    5\' 8"  (1.727 m)  PainSc:        Isolation Precautions No active isolations  Medications Medications  lacosamide (VIMPAT) 100 mg in sodium chloride 0.9 % 25 mL IVPB (0 mg Intravenous Stopped 05/14/23 1046)  lamoTRIgine (LAMICTAL) tablet 100 mg (100 mg Oral Not Given 05/14/23 0936)  lamoTRIgine (LAMICTAL) tablet 50 mg (50 mg Oral Not Given 05/14/23 0515)  Oral care mouth rinse (15 mLs Mouth Rinse Given 05/14/23 1204)  Oral care mouth rinse (has no administration in time range)  acetaminophen (TYLENOL) tablet 650 mg (has no administration in time range)    Or  acetaminophen (TYLENOL) suppository 650 mg (has no administration in time range)  ondansetron (ZOFRAN) tablet 4 mg (has no administration in time range)    Or  ondansetron (ZOFRAN) injection 4 mg (has no administration in time range)  insulin aspart (novoLOG) injection 0-9 Units ( Subcutaneous Not Given 05/14/23 1212)    Mobility Walks at home but hasn't been out of bed in ED     Focused Assessments Neuro Assessment Handoff:  Swallow screen pass? No  Cardiac Rhythm: Normal sinus rhythm NIH Stroke Scale  Dizziness Present: No Headache Present: No     Neuro Assessment: Exceptions to WDL Neuro Checks:      Has TPA been given? No If  patient is a Neuro Trauma and patient is going to OR before floor call report to 4N Charge nurse: [phone] or [phone]   R Recommendations: See Admitting Provider Note  Report given to:   Additional Notes: Overnight EEG.

## 2023-05-14 NOTE — ED Notes (Signed)
Pt removed from bedpan, pt fell back asleep before having BM. EEG Tech remains at bedside.

## 2023-05-14 NOTE — Progress Notes (Signed)
PROGRESS NOTE    Alan Wise  ZOX:[id number] DOB: December 15, 1970 DOA: 05/14/2023 PCP: Lorre Munroe, NP   Brief Narrative:  Alan Wise is a 52 y.o. 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  Patient presents to [facility] with seizure-like activity, no recent seizures per wife, last known seizure was 2 years ago.  At [facility] patient was unresponsive, did not improve with initial Ativan, subsequently received an additional 4.5 g of Keppra with no further seizure-like activity but profound encephalopathy.  Transferred to [facility] for ongoing treatment and continuous EEG.   Assessment & Plan:   Principal Problem:   Status epilepticus (HCC) Active Problems:   Oligodendroglioma (HCC)   DM2 (diabetes mellitus, type 2) (HCC)   Status epilepticus (HCC) Acute metabolic encephalopathy Concurrent polypharmacy -Neurology consulted, continues EEG ongoing, no noted seizures thus far per neurology  -Moderately lethargic, poorly responsive today, unclear if secondary to postictal state versus polypharmacy versus other -Presume patient's known oligodendroglioma as provocation for recurrent seizures -Defer to neurology for further antiepileptic regimen -Chronic tumor findings on CT head but no acute change (ie no hemorrhage, etc).   Oligodendroglioma [facility]) S/p chemo radiation previously. -Last treatment scheduled for 11/21(today - on avastin) -defer to Dr. Liana Gerold office for further recommendations and management in regards to ongoing treatment Per Dr. Barbaraann Cao: "Can increase lamictal to 100 bid if no provocation for seizure. We can reschedule the infusion if he's not back to baseline and still inpatient this pm" Tumor actually significantly smaller / improved on MRI just a couple of days ago suggesting  treatment response (see MRI for details).   DM2 (diabetes mellitus, type 2) (HCC) Sensitive SSI Q4H for the moment. Continue hypoglycemic protocol given poor PO intake today - consider D5 if patient not able to take PO this afternoon Lab Results  Component Value Date   HGBA1C 7.6 (A) 02/16/2023   DVT prophylaxis: SCDs Start: 05/14/23 0609   Code Status:   Code Status: Full Code  Family Communication: At bedside  Status is: Inpt  Dispo: The patient is from: Home              Anticipated d/c is to: Home              Anticipated d/c date is: TBD              Patient currently not medically stable for discharge  Consultants:  Neuro  Procedures:  none  Antimicrobials:  none   Subjective: Currently asleep - poorly arousable, ROS limited - no acute issues/events this am per wife.  Objective: Vitals:   05/14/23 0730 05/14/23 0745 05/14/23 0800 05/14/23 0815  BP: 137/86 (!) 142/85 131/84 (!) 148/80  Pulse: 69 69 71 65  Resp:      Temp:      TempSrc:      SpO2: 96% 95% 95% 93%   No intake or output data in the 24 hours ending 05/14/23 0826 There were no vitals filed for this visit.  Examination:  General exam: Somnolent - poorly arousable Respiratory system: Clear to auscultation. Respiratory effort normal. Cardiovascular system: S1 & S2 heard, RRR. No JVD, murmurs, rubs, gallops or clicks. No pedal edema. Gastrointestinal system: Abdomen is nondistended, soft and nontender. No organomegaly or masses felt. Normal bowel sounds heard. Central nervous system: Somnolent - unable to assess  Data Reviewed: I have personally reviewed following labs and imaging studies  CBC: Recent Labs  Lab 05/13/23 2116  WBC 7.9  NEUTROABS 5.4  HGB 16.2  HCT 46.3  MCV 82.8  PLT 232   Basic Metabolic Panel: Recent Labs  Lab 05/13/23 2116  NA 137  K 3.4*  CL 99  CO2 26  GLUCOSE 157*  BUN 14  CREATININE 0.88  CALCIUM 9.2   GFR: Estimated Creatinine Clearance: 96.1  mL/min (by C-G formula based on SCr of 0.88 mg/dL). Liver Function Tests: Recent Labs  Lab 05/13/23 2116  AST 19  ALT 23  ALKPHOS 112  BILITOT 0.5  PROT 7.8  ALBUMIN 4.8   Coagulation Profile: Recent Labs  Lab 05/13/23 2116  INR 1.0   CBG: Recent Labs  Lab 05/14/23 0810  GLUCAP 124*    No results found for this or any previous visit (from the past 240 hour(s)).   Radiology Studies: CT HEAD CODE STROKE WO CONTRAST  Result Date: 05/13/2023 CLINICAL DATA:  Code stroke.  Aphasia EXAM: CT HEAD WITHOUT CONTRAST TECHNIQUE: Contiguous axial images were obtained from the base of the skull through the vertex without intravenous contrast. RADIATION DOSE REDUCTION: This exam was performed according to the departmental dose-optimization program which includes automated exposure control, adjustment of the mA and/or kV according to patient size and/or use of iterative reconstruction technique. COMPARISON:  Brain MRI 05/05/2023 FINDINGS: Brain: No hemorrhage. No hydrocephalus. No extra-axial fluid collection. Redemonstrated postsurgical changes in the right frontal lobe, which are better assessed on recent prior brain MRI dated 05/05/2023. No CT evidence of an acute cortical infarct. Vascular: No hyperdense vessel or unexpected calcification. Skull: Normal. Negative for fracture or focal lesion. Sinuses/Orbits: No middle ear or mastoid effusion. Paranasal sinuses are clear. Orbits are unremarkable. Other: None. ASPECTS [facility] Stroke Program Early CT Score): 10 when accounting for chronic findings IMPRESSION: 1. No hemorrhage or CT evidence of an acute cortical infarct. 2. Redemonstrated post-treatment changes in the right frontal lobe, which are better assessed on recent prior brain MRI dated 05/05/2023. Electronically Signed   By: Lorenza Cambridge M.D.   On: 05/13/2023 21:34    Scheduled Meds:  insulin aspart  0-9 Units Subcutaneous Q4H   lamoTRIgine  100 mg Oral Daily   lamoTRIgine  50 mg Oral  Daily   mouth rinse  15 mL Mouth Rinse Q2H   Continuous Infusions:  lacosamide (VIMPAT) IV Stopped (05/14/23 0517)     LOS: 0 days   Time spent:  Azucena Fallen, DO [facility]  If 7PM-7AM, please contact night-coverage [email or web address]  05/14/2023, 8:26 AM

## 2023-05-14 NOTE — Assessment & Plan Note (Signed)
Break through seizure activity: sounds like initial symptoms were focal (as prior seizures have been) ultimately progressing to generalized and convulsive. See neuro consult note Getting overnight EEG - doesn't seem to have ongoing seizure activity based on initial read by Dr. Amada Jupiter Got 100mg  lamictal and AED orders per neurology Now lethargic due either to being post-ictal or the amount of AEDs he got in ED at [facility] + here (2mg  ativan + 4.5gm keppra + 100mg  vimpat). Seizure precautions Neuro checks Chronic tumor findings on CT head but no acute change (ie no hemorrhage, etc).

## 2023-05-14 NOTE — ED Notes (Signed)
EEG tech at bedside. 

## 2023-05-14 NOTE — ED Notes (Signed)
Unable to perform complete neuro check. Pt only responds to noxious stimuli. Pt able to move all extremities but isn't following commands or answering questions.

## 2023-05-14 NOTE — ED Notes (Signed)
Seizure pads placed on side rails

## 2023-05-14 NOTE — ED Triage Notes (Addendum)
Pt transferred from [facility] ED for neurology consultation. Pt received 1.5 grams keppra IV by [facility] during transport. History of brain tumor. Wife at bedside.

## 2023-05-14 NOTE — H&P (Addendum)
History and Physical    Patient: Alan Wise:[id number] DOB: 03-05-1971 DOA: 05/14/2023 DOS: the patient was seen and examined on 05/14/2023 PCP: Lorre Munroe, NP  Patient coming from: Outside Hospital  Chief Complaint:  Chief Complaint  Patient presents with   Seizures   HPI: Alan Wise is a 52 y.o. 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  Pt currently on MAB + avastin it looks like.  Pt hadnt had breakthrough seizure in ~2 years per wife.  Today pt in to ED at [facility] with seizure like activity.  Specifically he went to lay down, subsequently daughter reported pt making abnormal sounds.  Pt unable to speak.  She asked him if they need to go to the ER, and to squeeze her hand if they did and he squeezed her hand. They came into the emergency department at [facility] and a code stroke was activated.  Pt with left gaze deviation.  While in CT he began having more convulsive seizure like activity with LE tremor and increased tone in LUE.  Pt given 2mg  ativan, then 3gm keppra in ED, and when still had some seizure like activity given another 1.5gm keppra.  After this pt lethargic / no further seizure like activity.  Transferred to [facility].  CT today without acute findings.  MRI from 11/12 actually showed improvement in tumor.  Review of Systems: As mentioned in the history of present illness. All other systems reviewed and are negative. Past Medical History:  Diagnosis Date   Allergy    Complication of anesthesia    pt reports he is starting to have more difficulty arousing after surgery   Diabetes mellitus (HCC)    GERD (gastroesophageal reflux disease)    Headache    History of kidney stones    Hyperlipidemia    Renal disorder    kidney stones   Past Surgical History:   Procedure Laterality Date   APPLICATION OF CRANIAL NAVIGATION Right 01/17/2021   Procedure: APPLICATION OF CRANIAL NAVIGATION;  Surgeon: Bedelia Person, MD;  Location: [facility] OR;  Service: Neurosurgery;  Laterality: Right;   COLONOSCOPY  09/03/1994   COLONOSCOPY WITH PROPOFOL N/A 01/07/2023   Procedure: COLONOSCOPY WITH PROPOFOL;  Surgeon: [name], MD;  Location: [facility] ENDOSCOPY;  Service: Endoscopy;  Laterality: N/A;  NOT TOO EARLY   CYSTOSCOPY WITH STENT PLACEMENT Right 01/25/2016   Procedure: CYSTOSCOPY WITH STENT PLACEMENT;  Surgeon: Hildred Laser, MD;  Location: [facility] ORS;  Service: Urology;  Laterality: Right;   FRAMELESS  BIOPSY WITH BRAINLAB Right 01/17/2021   Procedure: RIGHT STEREOTACTIC BIOPSY OF INSULAR LESION;  Surgeon: Bedelia Person, MD;  Location[facility] OR;  Service: Neurosurgery;  Laterality: Right;   KNEE ARTHROSCOPY W/ MENISCAL REPAIR Right 06/23/1988   POLYPECTOMY  01/07/2023   Procedure: POLYPECTOMY;  Surgeon: [name], MD;  Location: [facility] ENDOSCOPY;  Service: Endoscopy;;   removal of birthmark  06/08/2013   SKIN CANCER EXCISION  06/23/2012   TYMPANOSTOMY TUBE PLACEMENT  08/06/1978   URETEROSCOPY WITH HOLMIUM LASER LITHOTRIPSY Right 01/25/2016   Procedure: URETEROSCOPY WITH HOLMIUM LASER LITHOTRIPSY;  Surgeon: Hildred Laser, MD;  Location: [facility] ORS;  Service: Urology;  Laterality: Right;   URETHRAL STRICTURE DILATATION     visual inspection of vocal cord     1973   WISDOM TOOTH EXTRACTION  Social History:  reports that he has never smoked. He has never used smokeless tobacco. He reports that he does not currently use alcohol. He reports that he does not use drugs.  Allergies  Allergen Reactions   Contrast Media [Iodinated Contrast Media] Swelling    Family History  Problem Relation Age of Onset   Hyperlipidemia Father    Hypertension Father    Diabetes Father    Benign prostatic hyperplasia Father    Stroke Maternal Grandmother    Diabetes  Maternal Grandmother    Stroke Paternal Grandmother    Hypertension Paternal Grandmother    Kidney disease Neg Hx    Prostate cancer Neg Hx     Prior to Admission medications   Medication Sig Start Date End Date Taking? Authorizing Provider  lamoTRIgine (LAMICTAL) 100 MG tablet TAKE 1 TABLET BY MOUTH EVERY DAY 02/26/23  Yes Henreitta Leber, MD    Physical Exam: Vitals:   05/14/23 0430 05/14/23 0500 05/14/23 0515 05/14/23 0612  BP: (!) 150/92 (!) 140/89 (!) 145/87   Pulse: 78 77 78   Resp:   20   Temp:    (!) 97.5 F (36.4 C)  TempSrc:    Axillary  SpO2: 95% 95% 94%    Constitutional: Lethargic Respiratory: clear to auscultation bilaterally, no wheezing, no crackles. Normal respiratory effort. No accessory muscle use.  Cardiovascular: Regular rate and rhythm, no murmurs / rubs / gallops. No extremity edema. 2+ pedal pulses. No carotid bruits.  Abdomen: no tenderness, no masses palpated. No hepatosplenomegaly. Bowel sounds positive.  Neurologic: Withdraws all 4, some spontaneous movements, normal tone.  Data Reviewed:    Labs on Admission: I have personally reviewed following labs and imaging studies  CBC: Recent Labs  Lab 05/13/23 2116  WBC 7.9  NEUTROABS 5.4  HGB 16.2  HCT 46.3  MCV 82.8  PLT 232   Basic Metabolic Panel: Recent Labs  Lab 05/13/23 2116  NA 137  K 3.4*  CL 99  CO2 26  GLUCOSE 157*  BUN 14  CREATININE 0.88  CALCIUM 9.2   GFR: Estimated Creatinine Clearance: 96.1 mL/min (by C-G formula based on SCr of 0.88 mg/dL). Liver Function Tests: Recent Labs  Lab 05/13/23 2116  AST 19  ALT 23  ALKPHOS 112  BILITOT 0.5  PROT 7.8  ALBUMIN 4.8   No results for input(s): "LIPASE", "AMYLASE" in the last 168 hours. No results for input(s): "AMMONIA" in the last 168 hours. Coagulation Profile: Recent Labs  Lab 05/13/23 2116  INR 1.0   Cardiac Enzymes: No results for input(s): "CKTOTAL", "CKMB", "CKMBINDEX", "TROPONINI" in the last 168  hours. BNP (last 3 results) No results for input(s): "PROBNP" in the last 8760 hours. HbA1C: No results for input(s): "HGBA1C" in the last 72 hours. CBG: No results for input(s): "GLUCAP" in the last 168 hours. Lipid Profile: No results for input(s): "CHOL", "HDL", "LDLCALC", "TRIG", "CHOLHDL", "LDLDIRECT" in the last 72 hours. Thyroid Function Tests: No results for input(s): "TSH", "T4TOTAL", "FREET4", "T3FREE", "THYROIDAB" in the last 72 hours. Anemia Panel: No results for input(s): "VITAMINB12", "FOLATE", "FERRITIN", "TIBC", "IRON", "RETICCTPCT" in the last 72 hours. Urine analysis:    Component Value Date/Time   COLORURINE YELLOW (A) 01/24/2016 2152   APPEARANCEUR Cloudy (A) 02/20/2016 1441   LABSPEC 1.010 01/24/2016 2152   LABSPEC 1.026 05/04/2012 2052   PHURINE 6.0 01/24/2016 2152   GLUCOSEU 3+ (A) 02/20/2016 1441   GLUCOSEU Negative 05/04/2012 2052   HGBUR 3+ (A) 01/24/2016 2152  BILIRUBINUR Negative 02/20/2016 1441   BILIRUBINUR Negative 05/04/2012 2052   KETONESUR NEGATIVE 01/24/2016 2152   PROTEINUR 30 (A) 04/23/2023 1325   NITRITE Negative 02/20/2016 1441   NITRITE NEGATIVE 01/24/2016 2152   LEUKOCYTESUR Negative 02/20/2016 1441   LEUKOCYTESUR Negative 05/04/2012 2052    Radiological Exams on Admission: CT HEAD CODE STROKE WO CONTRAST  Result Date: 05/13/2023 CLINICAL DATA:  Code stroke.  Aphasia EXAM: CT HEAD WITHOUT CONTRAST TECHNIQUE: Contiguous axial images were obtained from the base of the skull through the vertex without intravenous contrast. RADIATION DOSE REDUCTION: This exam was performed according to the departmental dose-optimization program which includes automated exposure control, adjustment of the mA and/or kV according to patient size and/or use of iterative reconstruction technique. COMPARISON:  Brain MRI 05/05/2023 FINDINGS: Brain: No hemorrhage. No hydrocephalus. No extra-axial fluid collection. Redemonstrated postsurgical changes in the right  frontal lobe, which are better assessed on recent prior brain MRI dated 05/05/2023. No CT evidence of an acute cortical infarct. Vascular: No hyperdense vessel or unexpected calcification. Skull: Normal. Negative for fracture or focal lesion. Sinuses/Orbits: No middle ear or mastoid effusion. Paranasal sinuses are clear. Orbits are unremarkable. Other: None. ASPECTS [facility] Stroke Program Early CT Score): 10 when accounting for chronic findings IMPRESSION: 1. No hemorrhage or CT evidence of an acute cortical infarct. 2. Redemonstrated post-treatment changes in the right frontal lobe, which are better assessed on recent prior brain MRI dated 05/05/2023. Electronically Signed   By: Lorenza Cambridge M.D.   On: 05/13/2023 21:34    EKG: Independently reviewed.   Assessment and Plan: * Status epilepticus (HCC) Break through seizure activity: sounds like initial symptoms were focal (as prior seizures have been) ultimately progressing to generalized and convulsive. See neuro consult note Getting overnight EEG - doesn't seem to have ongoing seizure activity based on initial read by Dr. Amada Jupiter Got 100mg  lamictal and AED orders per neurology Now lethargic due either to being post-ictal or the amount of AEDs he got in ED at [facility] + here (2mg  ativan + 4.5gm keppra + 100mg  vimpat). Seizure precautions Neuro checks Chronic tumor findings on CT head but no acute change (ie no hemorrhage, etc).  Oligodendroglioma [facility]) S/p chemo radiation previously. Currently looks like he's getting a MAB therapy and avastin (MVASI), most recently on 10/31 Looks like next dose was actually going to be due today and had appointment with Dr. Barbaraann Cao today. Per Dr. Barbaraann Cao: "Can increase lamictal to 100 bid if no provocation for seizure. We can reschedule the infusion if he's not back to baseline and still inpatient this pm" Tumor actually significantly smaller / improved on MRI just a couple of days ago suggesting treatment  response (see MRI for details).  DM2 (diabetes mellitus, type 2) (HCC) Sensitive SSI Q4H for the moment.      Advance Care Planning:   Code Status: Full Code  Consults: Neuro, also sent message to Dr. Barbaraann Cao  Family Communication: Family at bedside  Severity of Illness: The appropriate patient status for this patient is INPATIENT. Inpatient status is judged to be reasonable and necessary in order to provide the required intensity of service to ensure the patient's safety. The patient's presenting symptoms, physical exam findings, and initial radiographic and laboratory data in the context of their chronic comorbidities is felt to place them at high risk for further clinical deterioration. Furthermore, it is not anticipated that the patient will be medically stable for discharge from the hospital within 2 midnights of admission.   *  I certify that at the point of admission it is my clinical judgment that the patient will require inpatient hospital care spanning beyond 2 midnights from the point of admission due to high intensity of service, high risk for further deterioration and high frequency of surveillance required.*  Author: Hillary Bow., DO 05/14/2023 6:34 AM  For on call review [email or web address].

## 2023-05-14 NOTE — ED Triage Notes (Addendum)
Pt to ED via POV c/o AMS. Pts wife says pt came back from work and started complaining of a headache. Headache worsened within an hour and wife says pt lost "fine motor skills". In triage, pt not following most commands. Hx of brain tumor Pt was wheeled back to room 3.

## 2023-05-14 NOTE — ED Provider Notes (Signed)
EMERGENCY DEPARTMENT AT [facility] Provider Note   CSN: [id number] Arrival date & time: 05/14/23  7829     History  Chief Complaint  Patient presents with   Seizures    Alan Wise is a 52 y.o. male.  Patient arrival to the ED in transfer from [facility] for headache and seizure like activity. In short, patient with hx of Oligodendroglioma diagnosed in 2022, treated with radiation and chemotherapy. Currently on Avastin infusions, last given on 04/23/23. Per chart review, at time of diagnosis, had been experiencing episodes of LUE "shaking" and "heaviness" as well as dysarthria and involuntary facial and hand movements.   He presented to [facility] tonight after acute onset of a sudden, severe headache which began around 2000. Had a similar headache last night, per wife, but this improved with Tylenol. Patient tonight was screaming in pain, had persistent N/V. When wife was trying to get patient to the car to bring him to the ED, his whole body when limp and weak. Wife states that seizure like activity began around the time of his head CT at OSH; was extremely agitated with decorticate posturing, shaking on BLE. Received IV Keppra at [facility] as well as 2mg  IV Ativan. Transferred to [facility] at recommendation of Dr. Amada Jupiter for stat EEG and admission.   Seizures      Home Medications Prior to Admission medications   Medication Sig Start Date End Date Taking? Authorizing Provider  lamoTRIgine (LAMICTAL) 100 MG tablet TAKE 1 TABLET BY MOUTH EVERY DAY 02/26/23  Yes Vaslow, Georgeanna Lea, MD      Allergies    Contrast media [iodinated contrast media]    Review of Systems   Review of Systems  Unable to perform ROS: Mental status change  Neurological:  Positive for seizures.    Physical Exam Updated Vital Signs BP (!) 168/93   Pulse 84   Temp 98.3 F (36.8 C) (Oral)   Resp 18   SpO2 98%   Physical Exam Vitals and nursing note reviewed.  Constitutional:       General: He is not in acute distress.    Appearance: He is well-developed.     Comments: Somnolent   HENT:     Head: Normocephalic and atraumatic.  Eyes:     General: No scleral icterus.    Conjunctiva/sclera: Conjunctivae normal.     Comments: Pupils 2-70mm and equal; reactive bilaterally, but sluggish.  Cardiovascular:     Rate and Rhythm: Normal rate and regular rhythm.     Pulses: Normal pulses.  Pulmonary:     Effort: Pulmonary effort is normal. No respiratory distress.     Comments: Respirations even and unlabored Musculoskeletal:        General: Normal range of motion.     Cervical back: Normal range of motion.  Skin:    General: Skin is warm and dry.     Coloration: Skin is not pale.     Findings: No erythema or rash.  Neurological:     Comments: Unable to complete meaningful exam 2/2 somnolence  Psychiatric:        Behavior: Behavior normal.     ED Results / Procedures / Treatments   Labs (all labs ordered are listed, but only abnormal results are displayed) Labs Reviewed  LAMOTRIGINE LEVEL    EKG None  Radiology CT HEAD CODE STROKE WO CONTRAST  Result Date: 05/13/2023 CLINICAL DATA:  Code stroke.  Aphasia EXAM: CT HEAD WITHOUT CONTRAST TECHNIQUE: Contiguous axial  images were obtained from the base of the skull through the vertex without intravenous contrast. RADIATION DOSE REDUCTION: This exam was performed according to the departmental dose-optimization program which includes automated exposure control, adjustment of the mA and/or kV according to patient size and/or use of iterative reconstruction technique. COMPARISON:  Brain MRI 05/05/2023 FINDINGS: Brain: No hemorrhage. No hydrocephalus. No extra-axial fluid collection. Redemonstrated postsurgical changes in the right frontal lobe, which are better assessed on recent prior brain MRI dated 05/05/2023. No CT evidence of an acute cortical infarct. Vascular: No hyperdense vessel or unexpected calcification. Skull:  Normal. Negative for fracture or focal lesion. Sinuses/Orbits: No middle ear or mastoid effusion. Paranasal sinuses are clear. Orbits are unremarkable. Other: None. ASPECTS [facility] Stroke Program Early CT Score): 10 when accounting for chronic findings IMPRESSION: 1. No hemorrhage or CT evidence of an acute cortical infarct. 2. Redemonstrated post-treatment changes in the right frontal lobe, which are better assessed on recent prior brain MRI dated 05/05/2023. Electronically Signed   By: Lorenza Cambridge M.D.   On: 05/13/2023 21:34    Procedures .Critical Care  Performed by: Antony Madura, PA-C Authorized by: Antony Madura, PA-C   Critical care provider statement:    Critical care time (minutes):  30   Critical care time was exclusive of:  Separately billable procedures and treating other patients   Critical care was necessary to treat or prevent imminent or life-threatening deterioration of the following conditions:  CNS failure or compromise (AMS; seizure like activity)   Critical care was time spent personally by me on the following activities:  Development of treatment plan with patient or surrogate, discussions with consultants, evaluation of patient's response to treatment, examination of patient, ordering and review of laboratory studies, ordering and review of radiographic studies, ordering and performing treatments and interventions, pulse oximetry, re-evaluation of patient's condition, review of old charts and obtaining history from patient or surrogate   I assumed direction of critical care for this patient from another provider in my specialty: no     Care discussed with: admitting provider       Medications Ordered in ED Medications  lacosamide (VIMPAT) 100 mg in sodium chloride 0.9 % 25 mL IVPB (100 mg Intravenous New Bag/Given 05/14/23 0406)  lamoTRIgine (LAMICTAL) tablet 100 mg (has no administration in time range)  lamoTRIgine (LAMICTAL) tablet 50 mg (has no administration in time  range)    ED Course/ Medical Decision Making/ A&P Clinical Course as of 05/14/23 0459  Thu May 14, 2023  0128 MD Amada Jupiter aware of patient; to bedside momentarily. EEG tech also notified of patient arrival. [KH]  0153 MD Julian Reil of [facility] aware of patient.  [KH]    Clinical Course User Index [KH] Antony Madura, PA-C                                 Medical Decision Making Amount and/or Complexity of Data Reviewed Labs: ordered.  Risk Decision regarding hospitalization.   This patient presents to the ED for concern of AMS and seizure like activity, this involves an extensive number of treatment options, and is a complaint that carries with it a high risk of complications and morbidity.  The differential diagnosis includes ICH vs embolic CVA vs PRES vs seizure disorder vs hypoglycemia   Co morbidities that complicate the patient evaluation  Oligodendroglioma DM HLD   Additional history obtained:  Additional history obtained from wife, at bedside External  records from outside source obtained and reviewed including CT head at OSH which showed no hemorrhage or acute CVA. Labs reassuring.   Lab Tests:  I Ordered, and personally interpreted labs.  The pertinent results include:  pending lamictal level   Cardiac Monitoring:  The patient was maintained on a cardiac monitor.  I personally viewed and interpreted the cardiac monitored which showed an underlying rhythm of: NSR   Medicines ordered and prescription drug management:  I have reviewed the patients home medicines and have made adjustments as needed   Test Considered:  MRI brain w/w/o contrast   Critical Interventions:  Continuous EEG monitoring   Consultations Obtained:  I requested consultation with Dr. Amada Jupiter of Neurology and discussed lab and imaging findings as well as pertinent plan - they recommend: EEG monitoring overnight. Will see patient in consultation. Refer to note for further rec's. Dr.  Julian Reil of [facility] to admit.   Problem List / ED Course:  As above Plan admission to hospitalist service. Neurology on board. Placed on overnight EEG monitoring shortly following arrival. No additional seizure like activity in the ED.   Reevaluation:  After the interventions noted above, I reevaluated the patient and found that they have : remained stable   Social Determinants of Health:  Good social support   Dispostion:  After consideration of the diagnostic results and the patients response to treatment, I feel that the patent would benefit from admission for further monitoring. Plan for admission to hospitalist service. Neurology seeing patient in consultation.          Final Clinical Impression(s) / ED Diagnoses Final diagnoses:  Seizure-like activity [facility])  Bad headache    Rx / DC Orders ED Discharge Orders     None         Antony Madura, PA-C 05/14/23 0505    Nira Conn, MD 05/14/23 [id number]

## 2023-05-14 NOTE — ED Notes (Signed)
[facility] at bedside for pt transfer to [facility] ED

## 2023-05-14 NOTE — ED Notes (Signed)
Pt resting on stretcher. Pt with frequent yawning and occasional eye opening to voice or stimulation. Pt able to move arm to assist with removing his shirt. Pts wife reports he was very lethargic on arrival to [facility] ED and has remained lethargic since. Pt also moving legs intermittently. Pt on full monitoring devices.

## 2023-05-14 NOTE — Assessment & Plan Note (Addendum)
S/p chemo radiation previously. Currently looks like he's getting a MAB therapy and avastin (MVASI), most recently on 10/31 Looks like next dose was actually going to be due today and had appointment with Dr. Barbaraann Cao today. Per Dr. Barbaraann Cao: "Can increase lamictal to 100 bid if no provocation for seizure. We can reschedule the infusion if he's not back to baseline and still inpatient this pm" Tumor actually significantly smaller / improved on MRI just a couple of days ago suggesting treatment response (see MRI for details).

## 2023-05-14 NOTE — ED Notes (Signed)
..  EMTALA: REQUIRED DOCUMENTATION COMPLETED AND REVIEWED BY WRITER PRIOR TO PT TRANSFER MD REASSESSMENT EMTALA RN SECTION TRANSFER E-SIGN VS WITHIN REQUIRED TIME

## 2023-05-14 NOTE — Assessment & Plan Note (Signed)
Sensitive SSI Q4H for the moment. 

## 2023-05-14 NOTE — Consult Note (Addendum)
NEUROLOGY CONSULT NOTE   Date of service: May 14, 2023 Patient Name: Alan Wise MRN:  [id number] DOB:  06-10-1971 Chief Complaint: "Seizure" Requesting Provider: Nira Conn, *  History of Present Illness  Alan Wise is a 52 y.o. male  has a past medical history of Allergy, Complication of anesthesia, Diabetes mellitus (HCC), GERD (gastroesophageal reflux disease), Headache, History of kidney stones, Hyperlipidemia, and Renal disorder.   He presents with episode concerning for seizure.  His wife describes that he went to lay down, and subsequently her daughter advised that he was making abnormal sounds.  She went in to find him unable to speak groaning as if in pain.  She asked him if they need to go to the ER, and to squeeze her hand if they did and he squeezed her hand.  They came into the emergency department at [facility] and a code stroke was activated.  It was noted that he had left gaze deviation.  While in CT, he began having shaking of his lower extremities and markedly increased tone in left upper extremity.  He was evaluated by teleneurology and given 2 mg of Ativan and 3 g of Keppra.  His left arm remained tense and he continued to have some shaking of his lower extremities and therefore the decision was made to transfer him emergently for EEG evaluation.  He was also given an additional 1.5 g of Keppra and his left arm seem to relax following this  (According to his wife).  It has been approximately 2 years since his last seizure, he has been taking lamotrigine 100 mg daily.  Of note, he has been on Keppra past and had significant irritability associated with it.  MRI from 11/12 was improving.   Past History   Past Medical History:  Diagnosis Date   Allergy    Complication of anesthesia    pt reports he is starting to have more difficulty arousing after surgery   Diabetes mellitus (HCC)    GERD (gastroesophageal reflux disease)    Headache     History of kidney stones    Hyperlipidemia    Renal disorder    kidney stones    Past Surgical History:  Procedure Laterality Date   APPLICATION OF CRANIAL NAVIGATION Right 01/17/2021   Procedure: APPLICATION OF CRANIAL NAVIGATION;  Surgeon: Bedelia Person, MD;  Location: [facility] OR;  Service: Neurosurgery;  Laterality: Right;   COLONOSCOPY  09/03/1994   COLONOSCOPY WITH PROPOFOL N/A 01/07/2023   Procedure: COLONOSCOPY WITH PROPOFOL;  Surgeon: [name], MD;  Location: [facility] ENDOSCOPY;  Service: Endoscopy;  Laterality: N/A;  NOT TOO EARLY   CYSTOSCOPY WITH STENT PLACEMENT Right 01/25/2016   Procedure: CYSTOSCOPY WITH STENT PLACEMENT;  Surgeon: Hildred Laser, MD;  Location: [facility] ORS;  Service: Urology;  Laterality: Right;   FRAMELESS  BIOPSY WITH BRAINLAB Right 01/17/2021   Procedure: RIGHT STEREOTACTIC BIOPSY OF INSULAR LESION;  Surgeon: Bedelia Person, MD;  Location: [facility] OR;  Service: Neurosurgery;  Laterality: Right;   KNEE ARTHROSCOPY W/ MENISCAL REPAIR Right 06/23/1988   POLYPECTOMY  01/07/2023   Procedure: POLYPECTOMY;  Surgeon: [name], MD;  Location: [facility] ENDOSCOPY;  Service: Endoscopy;;   removal of birthmark  06/08/2013   SKIN CANCER EXCISION  06/23/2012   TYMPANOSTOMY TUBE PLACEMENT  08/06/1978   URETEROSCOPY WITH HOLMIUM LASER LITHOTRIPSY Right 01/25/2016   Procedure: URETEROSCOPY WITH HOLMIUM LASER LITHOTRIPSY;  Surgeon: Hildred Laser, MD;  Location: [facility] ORS;  Service: Urology;  Laterality: Right;   URETHRAL STRICTURE DILATATION     visual inspection of vocal cord     1973   WISDOM TOOTH EXTRACTION      Family History: Family History  Problem Relation Age of Onset   Hyperlipidemia Father    Hypertension Father    Diabetes Father    Benign prostatic hyperplasia Father    Stroke Maternal Grandmother    Diabetes Maternal Grandmother    Stroke Paternal Grandmother    Hypertension Paternal Grandmother    Kidney disease Neg Hx    Prostate cancer Neg Hx      Social History  reports that he has never smoked. He has never used smokeless tobacco. He reports that he does not currently use alcohol. He reports that he does not use drugs.  Allergies  Allergen Reactions   Contrast Media [Iodinated Contrast Media] Swelling    Medications  No current facility-administered medications for this encounter.  Current Outpatient Medications:    lamoTRIgine (LAMICTAL) 100 MG tablet, TAKE 1 TABLET BY MOUTH EVERY DAY, Disp: 60 tablet, Rfl: 1   oxyCODONE-acetaminophen (PERCOCET) 5-325 MG tablet, Take 1 tablet by mouth every 6 (six) hours as needed for severe pain (pain score 7-10). (Patient not taking: Reported on 06-12-23), Disp: 16 tablet, Rfl: 0  Vitals   Vitals:   05/14/23 0058  BP: (!) 190/110  Pulse: 82  Resp: 20  Temp: 98.3 F (36.8 C)  TempSrc: Oral  SpO2: 97%    There is no height or weight on file to calculate BMI.  Physical Exam   Constitutional: Appears well-developed and well-nourished.   Neurologic Examination    Neuro: Mental Status: Patient is obtunded, but with repeated noxious stimulation I am able to get him to arouse and follow commands to wiggle toes, though he does not follow other commands. Cranial Nerves: II: Does not blink to threat. Pupils are equal, round, and reactive to light.   III,IV, VI: Eyes are not deviated, he does cross midline in both directions, but appears to have a right gaze preference Face is symmetric Motor: He withdraws to noxious stimulation in all four extremities, and has some spontaneous movements.  Tone is normal Sensory: As above Cerebellar: Does not perform       Labs/Imaging/Neurodiagnostic studies   CBC:  Recent Labs  Lab 2023-06-12 2116  WBC 7.9  NEUTROABS 5.4  HGB 16.2  HCT 46.3  MCV 82.8  PLT 232   Basic Metabolic Panel:  Lab Results  Component Value Date   NA 137 06-12-23   K 3.4 (L) 2023-06-12   CO2 26 06-12-2023   GLUCOSE 157 (H) Jun 12, 2023   BUN 14  06/12/2023   CREATININE 0.88 June 12, 2023   CALCIUM 9.2 2023/06/12   GFRNONAA >60 June 12, 2023   GFRAA 123 12/05/2020   Lipid Panel:  Lab Results  Component Value Date   LDLCALC 134 (H) 11/14/2022   HgbA1c:  Lab Results  Component Value Date   HGBA1C 7.6 (A) 02/16/2023   Urine Drug Screen: No results found for: "LABOPIA", "COCAINSCRNUR", "LABBENZ", "AMPHETMU", "THCU", "LABBARB"  Alcohol Level     Component Value Date/Time   ETH <10 2023/06/12 2116   INR  Lab Results  Component Value Date   INR 1.0 06/12/2023   APTT  Lab Results  Component Value Date   APTT 29 June 12, 2023   AED levels: No results found for: "PHENYTOIN", "ZONISAMIDE", "LAMOTRIGINE", "LEVETIRACETA"  CT Head without contrast(Personally reviewed): Negative for acute findings, his posttreatment changes in the  right frontal region are apparent  ASSESSMENT   Alan Wise is a 52 y.o. male  has a past medical history of Allergy, Complication of anesthesia, Diabetes mellitus (HCC), GERD (gastroesophageal reflux disease), Headache, History of kidney stones, Hyperlipidemia, and Renal disorder.  He has had a breakthrough seizure in the setting of a known tumor.  Unclear precipitant, but he is on a fairly low-dose of lamotrigine and I would typically favor increasing this, however given how difficult his seizure was to control in the short-term as this medication is increased I would favor a second agent.  With his intolerance of Keppra previously, I will choose Vimpat.  Especially given that his initial symptoms were nonconvulsive, I would favor ruling out continued intermittent subclinical seizures with overnight EEG.  RECOMMENDATIONS  Increase lamotrigine to 100 mg in the morning and 50 mg at night Following a week at this dose, I would increase to 100 mg twice daily Following a week at 100 mg twice daily, I would increase to 125 mg twice daily. Lacosamide 100 mg twice daily, could consider weaning this once he is  on 250 mg/day of lamotrigine. Overnight EEG to rule out subclinical seizures. ______________________________________________________________________    Stormy Card, MD [facility]

## 2023-05-14 NOTE — Procedures (Addendum)
Patient Name: BREKYN VANGORDER  MRN: [id number]  Epilepsy Attending: Charlsie Quest  Referring Physician/Provider: Rejeana Brock, MD  Duration: 05/14/2023 0257 to 05/15/2023 0257  Patient history: 52 yo M with breakthrough seizure in the setting of a known tumor. EEG to evaluate for seizure  Level of alertness: Awake, asleep  AEDs during EEG study: LCM, LTG  Technical aspects: This EEG study was done with scalp electrodes positioned according to the 10-20 International system of electrode placement. Electrical activity was reviewed with band pass filter of 1-70Hz , sensitivity of 7 uV/mm, display speed of 44mm/sec with a 60Hz  notched filter applied as appropriate. EEG data were recorded continuously and digitally stored.  Video monitoring was available and reviewed as appropriate.  Description: The posterior dominant rhythm consists of 9-10 Hz activity of moderate voltage (25-35 uV) seen predominantly in posterior head regions, symmetric and reactive to eye opening and eye closing. Sleep was characterized by vertex waves, sleep spindles (12 to 14 Hz), maximal frontocentral region. EEG showed continuous 3 to 6 Hz theta-delta slowing in right temporal region. Hyperventilation and photic stimulation were not performed.     ABNORMALITY - Continuous slow, right temporal region  IMPRESSION: This study is suggestive of cortical dysfunction arising from right temporal region likely secondary to underlying tumor. No seizures or epileptiform discharges were seen throughout the recording.  Elena Davia Annabelle Harman

## 2023-05-14 NOTE — ED Notes (Signed)
Neuro MD at bedside

## 2023-05-14 NOTE — ED Notes (Signed)
Pt only responding to tickles on feet and painful stimuli. Pt withdraws extremities and turns his head. Pt's family member is at bedside and she states he is usually very hard to arouse after he has had a seizure and is given medication.

## 2023-05-14 NOTE — ED Notes (Signed)
Pt awake and speaking some with wife. Wife assisted patient with use of urinal and pt placed on bedpan by staff.

## 2023-05-14 NOTE — Progress Notes (Signed)
LTM EEG hooked up and running - no initial skin breakdown - push button tested - [facility] monitoring.  

## 2023-05-15 ENCOUNTER — Encounter: Payer: Self-pay | Admitting: Internal Medicine

## 2023-05-15 ENCOUNTER — Telehealth (HOSPITAL_COMMUNITY): Payer: Self-pay | Admitting: Pharmacy Technician

## 2023-05-15 ENCOUNTER — Other Ambulatory Visit (HOSPITAL_COMMUNITY): Payer: Self-pay

## 2023-05-15 DIAGNOSIS — G40901 Epilepsy, unspecified, not intractable, with status epilepticus: Secondary | ICD-10-CM | POA: Diagnosis not present

## 2023-05-15 LAB — COMPREHENSIVE METABOLIC PANEL
ALT: 18 U/L (ref 0–44)
AST: 14 U/L — ABNORMAL LOW (ref 15–41)
Albumin: 3.7 g/dL (ref 3.5–5.0)
Alkaline Phosphatase: 84 U/L (ref 38–126)
Anion gap: 6 (ref 5–15)
BUN: 12 mg/dL (ref 6–20)
CO2: 28 mmol/L (ref 22–32)
Calcium: 8.9 mg/dL (ref 8.9–10.3)
Chloride: 104 mmol/L (ref 98–111)
Creatinine, Ser: 1.25 mg/dL — ABNORMAL HIGH (ref 0.61–1.24)
GFR, Estimated: >60 mL/min (ref >60)
Glucose, Bld: 109 mg/dL — ABNORMAL HIGH (ref 70–99)
Potassium: 4.1 mmol/L (ref 3.5–5.1)
Sodium: 138 mmol/L (ref 135–145)
Total Bilirubin: 0.6 mg/dL (ref <1.2)
Total Protein: 6.6 g/dL (ref 6.5–8.1)

## 2023-05-15 LAB — CBC
HCT: 46.4 % (ref 39.0–52.0)
Hemoglobin: 16.4 g/dL (ref 13.0–17.0)
MCH: 29.3 pg (ref 26.0–34.0)
MCHC: 35.3 g/dL (ref 30.0–36.0)
MCV: 83 fL (ref 80.0–100.0)
Platelets: 204 10*3/uL (ref 150–400)
RBC: 5.59 MIL/uL (ref 4.22–5.81)
RDW: 12.1 % (ref 11.5–15.5)
WBC: 7.6 10*3/uL (ref 4.0–10.5)
nRBC: 0 % (ref 0.0–0.2)

## 2023-05-15 LAB — LAMOTRIGINE LEVEL: Lamotrigine Lvl: 1.8 ug/mL — ABNORMAL LOW (ref 2.0–20.0)

## 2023-05-15 LAB — GLUCOSE, CAPILLARY
Glucose-Capillary: 106 mg/dL — ABNORMAL HIGH (ref 70–99)
Glucose-Capillary: 108 mg/dL — ABNORMAL HIGH (ref 70–99)
Glucose-Capillary: 148 mg/dL — ABNORMAL HIGH (ref 70–99)

## 2023-05-15 MED ORDER — LACOSAMIDE 100 MG PO TABS
100.0000 mg | ORAL_TABLET | Freq: Two times a day (BID) | ORAL | 0 refills | Status: DC
  Filled 2023-05-15: qty 60, 30d supply, fill #0

## 2023-05-15 MED ORDER — VALTOCO 15 MG DOSE 7.5 MG/0.1ML NA LQPK
15.0000 mg | Freq: Once | NASAL | 0 refills | Status: DC | PRN
  Filled 2023-05-15: qty 2, 30d supply, fill #0

## 2023-05-15 MED ORDER — LAMOTRIGINE 100 MG PO TABS
ORAL_TABLET | ORAL | 0 refills | Status: DC
  Filled 2023-05-15: qty 45, 30d supply, fill #0

## 2023-05-15 MED ORDER — LACOSAMIDE 50 MG PO TABS: 100.0000 mg | ORAL_TABLET | Freq: Two times a day (BID) | ORAL | Status: DC

## 2023-05-15 NOTE — Progress Notes (Signed)
vLTM discontinued.  [facility] notified.  No skin breakdown noted at all skin sites. ?

## 2023-05-15 NOTE — Plan of Care (Signed)
Pt alert and oriented x 2. Denies any pain or discomfort. IV's were patent and saline locked. IV's removed for discharge. Continent of bowel and bladder. BM this shift. Discharge instructions completed with pt and wife. All questions and concerns answered. Pt stable upon discharge.   Problem: Education: Goal: Ability to describe self-care measures that may prevent or decrease complications (Diabetes Survival Skills Education) will improve Outcome: Completed/Met Goal: Individualized Educational Video(s) Outcome: Completed/Met   Problem: Coping: Goal: Ability to adjust to condition or change in health will improve Outcome: Completed/Met   Problem: Fluid Volume: Goal: Ability to maintain a balanced intake and output will improve Outcome: Completed/Met   Problem: Health Behavior/Discharge Planning: Goal: Ability to identify and utilize available resources and services will improve Outcome: Completed/Met Goal: Ability to manage health-related needs will improve Outcome: Completed/Met   Problem: Metabolic: Goal: Ability to maintain appropriate glucose levels will improve Outcome: Completed/Met   Problem: Nutritional: Goal: Maintenance of adequate nutrition will improve Outcome: Completed/Met Goal: Progress toward achieving an optimal weight will improve Outcome: Completed/Met   Problem: Skin Integrity: Goal: Risk for impaired skin integrity will decrease Outcome: Completed/Met   Problem: Tissue Perfusion: Goal: Adequacy of tissue perfusion will improve Outcome: Completed/Met   Problem: Education: Goal: Expressions of having a comfortable level of knowledge regarding the disease process will increase Outcome: Completed/Met   Problem: Coping: Goal: Ability to adjust to condition or change in health will improve Outcome: Completed/Met Goal: Ability to identify appropriate support needs will improve Outcome: Completed/Met   Problem: Health Behavior/Discharge Planning: Goal:  Compliance with prescribed medication regimen will improve Outcome: Completed/Met   Problem: Medication: Goal: Risk for medication side effects will decrease Outcome: Completed/Met   Problem: Clinical Measurements: Goal: Complications related to the disease process, condition or treatment will be avoided or minimized Outcome: Completed/Met Goal: Diagnostic test results will improve Outcome: Completed/Met   Problem: Safety: Goal: Verbalization of understanding the information provided will improve Outcome: Completed/Met   Problem: Self-Concept: Goal: Level of anxiety will decrease Outcome: Completed/Met Goal: Ability to verbalize feelings about condition will improve Outcome: Completed/Met   Problem: Education: Goal: Knowledge of General Education information will improve Description: Including pain rating scale, medication(s)/side effects and non-pharmacologic comfort measures Outcome: Completed/Met   Problem: Health Behavior/Discharge Planning: Goal: Ability to manage health-related needs will improve Outcome: Completed/Met   Problem: Clinical Measurements: Goal: Ability to maintain clinical measurements within normal limits will improve Outcome: Completed/Met Goal: Will remain free from infection Outcome: Completed/Met Goal: Diagnostic test results will improve Outcome: Completed/Met Goal: Respiratory complications will improve Outcome: Completed/Met Goal: Cardiovascular complication will be avoided Outcome: Completed/Met   Problem: Activity: Goal: Risk for activity intolerance will decrease Outcome: Completed/Met   Problem: Nutrition: Goal: Adequate nutrition will be maintained Outcome: Completed/Met   Problem: Coping: Goal: Level of anxiety will decrease Outcome: Completed/Met   Problem: Elimination: Goal: Will not experience complications related to bowel motility Outcome: Completed/Met Goal: Will not experience complications related to urinary  retention Outcome: Completed/Met   Problem: Pain Management: Goal: General experience of comfort will improve Outcome: Completed/Met   Problem: Safety: Goal: Ability to remain free from injury will improve Outcome: Completed/Met   Problem: Skin Integrity: Goal: Risk for impaired skin integrity will decrease Outcome: Completed/Met

## 2023-05-15 NOTE — Discharge Summary (Signed)
Physician Discharge Summary  Alan Wise ZOX:[id number] DOB: [birth year] DOA: 05/14/2023  PCP: Lorre Munroe, NP  Admit date: 05/14/2023 Discharge date: 05/15/2023  Admitted From: Home Disposition: Home  Recommendations for Outpatient Follow-up:  Follow up with PCP in 1-2 weeks Follow-up Dr. Barbaraann Cao oncology as scheduled for repeat treatment Follow-up with neurology for ongoing management of seizure medications as below  Home Health: None Equipment/Devices: None  Discharge Condition: Stable CODE STATUS: Full Diet recommendation: Low-salt low-fat low-carb diet  Brief/Interim Summary: Alan Wise is a 52 y.o. 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  Patient presents to [facility] with seizure-like activity, ultimately transferred to [facility] for continuous EEG with concern for status.  Patient admitted as above with acute recurrent seizure versus status epilepticus, resolved with medication adjustment including increased Lamictal as well as new medication with Vimpat.  Patient did not have any notable seizure-like activity on EEG while here.  He is now awake alert oriented back to baseline per wife and is otherwise stable and agreeable for discharge home.  Close follow-up with Dr. Barbaraann Cao for ongoing treatment of his oligodendroglioma which is presumed to be the etiology of his recurrent seizure episode.  Imaging at intake shows improvement in tumor burden without overt bleed or acute changes.  Discharge Diagnoses:  Principal Problem:   Status epilepticus (HCC) Active Problems:   Oligodendroglioma (HCC)   DM2 (diabetes mellitus, type 2) (HCC)  Status epilepticus (HCC) Acute metabolic encephalopathy Concurrent polypharmacy -Resolved, follow-up neurology outpatient as discussed -Chronic tumor  findings on CT head but no acute change (ie no hemorrhage, etc) -with decreased tumor burden   Oligodendroglioma (HCC) S/p chemo radiation previously. -Last treatment scheduled for 11/21(avastin) -defer to Dr. Liana Gerold office for further recommendations and management in regards to ongoing treatment -Tumor actually significantly smaller / improved on MRI just a couple of days ago suggesting treatment response (see MRI for details).   DM2 (diabetes mellitus, type 2) (HCC) Resume home regimen at discharge  Discharge Instructions  Discharge Instructions     Discharge patient   Complete by: As directed    Discharge disposition: 01-Home or Self Care   Discharge patient date: 05/15/2023      Allergies as of 05/15/2023       Reactions   Contrast Media [iodinated Contrast Media] Swelling        Medication List     TAKE these medications    Lacosamide 100 MG Tabs Take 1 tablet (100 mg total) by mouth 2 (two) times daily.   lamoTRIgine 100 MG tablet Commonly known as: LAMICTAL Take 1 tablet (100 mg total) by mouth in the morning AND 0.5 tablets (50 mg total) at bedtime. What changed: See the new instructions.   Valtoco 15 MG Dose 7.5 MG/0.1ML Lqpk Generic drug: diazePAM (15 MG Dose) Place 15 mg into the nose once as needed for up to 1 dose (seizure).        Allergies  Allergen Reactions   Contrast Media [Iodinated Contrast Media] Swelling    Consultations: Neurology  Procedures/Studies: Overnight EEG with video  Result Date: 05/14/2023 Charlsie Quest, MD     05/15/2023  8:30 AM Patient Name: Alan Wise MRN: [id number] Epilepsy Attending: Charlsie Quest Referring Physician/Provider: Rejeana Brock, MD Duration: 05/14/2023 0257 to 05/15/2023 0257 Patient history: 52 yo M with breakthrough seizure in the setting  of a known tumor. EEG to evaluate for seizure Level of alertness: Awake, asleep AEDs during EEG study: LCM, LTG Technical aspects: This EEG  study was done with scalp electrodes positioned according to the 10-20 International system of electrode placement. Electrical activity was reviewed with band pass filter of 1-70Hz , sensitivity of 7 uV/mm, display speed of 92mm/sec with a 60Hz  notched filter applied as appropriate. EEG data were recorded continuously and digitally stored.  Video monitoring was available and reviewed as appropriate. Description: The posterior dominant rhythm consists of 9-10 Hz activity of moderate voltage (25-35 uV) seen predominantly in posterior head regions, symmetric and reactive to eye opening and eye closing. Sleep was characterized by vertex waves, sleep spindles (12 to 14 Hz), maximal frontocentral region. EEG showed continuous 3 to 6 Hz theta-delta slowing in right temporal region. Hyperventilation and photic stimulation were not performed.   ABNORMALITY - Continuous slow, right temporal region IMPRESSION: This study is suggestive of cortical dysfunction arising from right temporal region likely secondary to underlying tumor. No seizures or epileptiform discharges were seen throughout the recording. Charlsie Quest   CT HEAD CODE STROKE WO CONTRAST  Result Date: 05/13/2023 CLINICAL DATA:  Code stroke.  Aphasia EXAM: CT HEAD WITHOUT CONTRAST TECHNIQUE: Contiguous axial images were obtained from the base of the skull through the vertex without intravenous contrast. RADIATION DOSE REDUCTION: This exam was performed according to the departmental dose-optimization program which includes automated exposure control, adjustment of the mA and/or kV according to patient size and/or use of iterative reconstruction technique. COMPARISON:  Brain MRI 05/05/2023 FINDINGS: Brain: No hemorrhage. No hydrocephalus. No extra-axial fluid collection. Redemonstrated postsurgical changes in the right frontal lobe, which are better assessed on recent prior brain MRI dated 05/05/2023. No CT evidence of an acute cortical infarct. Vascular: No  hyperdense vessel or unexpected calcification. Skull: Normal. Negative for fracture or focal lesion. Sinuses/Orbits: No middle ear or mastoid effusion. Paranasal sinuses are clear. Orbits are unremarkable. Other: None. ASPECTS [facility] Stroke Program Early CT Score): 10 when accounting for chronic findings IMPRESSION: 1. No hemorrhage or CT evidence of an acute cortical infarct. 2. Redemonstrated post-treatment changes in the right frontal lobe, which are better assessed on recent prior brain MRI dated 05/05/2023. Electronically Signed   By: Lorenza Cambridge M.D.   On: 05/13/2023 21:34   MR BRAIN W WO CONTRAST  Result Date: 05/05/2023 CLINICAL DATA:  Brain/CNS neoplasm. Assess treatment response. Oligodendroglioma. Radiation therapy induced brain necrosis. EXAM: MRI HEAD WITHOUT AND WITH CONTRAST TECHNIQUE: Multiplanar, multiecho pulse sequences of the brain and surrounding structures were obtained without and with intravenous contrast. CONTRAST:  7mL GADAVIST GADOBUTROL 1 MMOL/ML IV SOLN COMPARISON:  02/19/2023.  01/09/2023. FINDINGS: Brain: Old small vessel ischemic changes are seen within the pons. The previously seen abnormality in the left pons represented a small vessel stroke. No sign of tumor in the pons or cerebellum. Considerable reduction in abnormal T2 and FLAIR signal with mass effect in the anterior aspect of the right cerebral hemisphere compared to the prior study. Abnormal T2 and FLAIR signal crossing within the genu of the corpus callosum is diminished. Mass-effect is diminish with less flattening of the right lateral ventricle and resolution of right to left midline shift. Foci of hemosiderin deposition related to previous treatment are unchanged. Previously seen abnormal enhancement within the right temporal lobe/deep insula is markedly reduced, only a punctate focus of enhancement there presently. Marked reduction an abnormal enhancement in the right frontal lobe. Resolution of abnormal  enhancement within the genu of the corpus callosum. Resolution of the previously noted abnormal enhancement in the inferior right frontal lobe. These findings suggest that much of what we were seeing previously was treatment effect. No evidence of new or worsening finding. Vascular: Major vessels at the base of the brain show flow. Skull and upper cervical spine: Negative Sinuses/Orbits: Clear/normal Other: None IMPRESSION: 1. Considerable reduction in abnormal T2 and FLAIR signal/ mass effect in the anterior aspect of the right cerebral hemisphere compared to the prior study. Marked reduction in abnormal enhancement on the right. These findings suggest that much of what we were seeing previously was treatment effect. No evidence of new or worsening finding. 2. Old small vessel ischemic changes within the pons. The previously seen abnormality in the left pons represented a small vessel stroke. No sign of tumor in the pons or cerebellum. Electronically Signed   By: Paulina Fusi M.D.   On: 05/05/2023 11:24     Subjective: No acute issues or events overnight   Discharge Exam: Vitals:   05/15/23 0346 05/15/23 0813  BP: 133/88 (!) 160/92  Pulse: 75 64  Resp: 18 18  Temp: 97.9 F (36.6 C) 97.6 F (36.4 C)  SpO2: 95% 97%   Vitals:   05/14/23 2001 05/14/23 2340 05/15/23 0346 05/15/23 0813  BP: (!) 141/94 (!) 135/96 133/88 (!) 160/92  Pulse: 91 76 75 64  Resp: 19 19 18 18   Temp: 98.6 F (37 C) 98.4 F (36.9 C) 97.9 F (36.6 C) 97.6 F (36.4 C)  TempSrc: Oral Oral Oral Oral  SpO2: 94% 96% 95% 97%  Weight:      Height:        General: Pt is alert, awake, not in acute distress Cardiovascular: RRR, S1/S2 +, no rubs, no gallops Respiratory: CTA bilaterally, no wheezing, no rhonchi Abdominal: Soft, NT, ND, bowel sounds + Extremities: no edema, no cyanosis    The results of significant diagnostics from this hospitalization (including imaging, microbiology, ancillary and laboratory) are  listed below for reference.     Microbiology: No results found for this or any previous visit (from the past 240 hour(s)).   Labs: BNP (last 3 results) No results for input(s): "BNP" in the last 8760 hours. Basic Metabolic Panel: Recent Labs  Lab 05/13/23 2116 05/15/23 0615  NA 137 138  K 3.4* 4.1  CL 99 104  CO2 26 28  GLUCOSE 157* 109*  BUN 14 12  CREATININE 0.88 1.25*  CALCIUM 9.2 8.9   Liver Function Tests: Recent Labs  Lab 05/13/23 2116 05/15/23 0615  AST 19 14*  ALT 23 18  ALKPHOS 112 84  BILITOT 0.5 0.6  PROT 7.8 6.6  ALBUMIN 4.8 3.7   No results for input(s): "LIPASE", "AMYLASE" in the last 168 hours. No results for input(s): "AMMONIA" in the last 168 hours. CBC: Recent Labs  Lab 05/13/23 2116 05/15/23 0615  WBC 7.9 7.6  NEUTROABS 5.4  --   HGB 16.2 16.4  HCT 46.3 46.4  MCV 82.8 83.0  PLT 232 204   Cardiac Enzymes: No results for input(s): "CKTOTAL", "CKMB", "CKMBINDEX", "TROPONINI" in the last 168 hours. BNP: Invalid input(s): "POCBNP" CBG: Recent Labs  Lab 05/14/23 2004 05/14/23 2336 05/15/23 0348 05/15/23 0906 05/15/23 1121  GLUCAP 162* 113* 106* 108* 148*   D-Dimer No results for input(s): "DDIMER" in the last 72 hours. Hgb A1c No results for input(s): "HGBA1C" in the last 72 hours. Lipid Profile No results for input(s): "CHOL", "HDL", "  LDLCALC", "TRIG", "CHOLHDL", "LDLDIRECT" in the last 72 hours. Thyroid function studies No results for input(s): "TSH", "T4TOTAL", "T3FREE", "THYROIDAB" in the last 72 hours.  Invalid input(s): "FREET3" Anemia work up No results for input(s): "VITAMINB12", "FOLATE", "FERRITIN", "TIBC", "IRON", "RETICCTPCT" in the last 72 hours. Urinalysis    Component Value Date/Time   COLORURINE YELLOW (A) 01/24/2016 2152   APPEARANCEUR Cloudy (A) 02/20/2016 1441   LABSPEC 1.010 01/24/2016 2152   LABSPEC 1.026 05/04/2012 2052   PHURINE 6.0 01/24/2016 2152   GLUCOSEU 3+ (A) 02/20/2016 1441   GLUCOSEU  Negative 05/04/2012 2052   HGBUR 3+ (A) 01/24/2016 2152   BILIRUBINUR Negative 02/20/2016 1441   BILIRUBINUR Negative 05/04/2012 2052   KETONESUR NEGATIVE 01/24/2016 2152   PROTEINUR 30 (A) 04/23/2023 1325   NITRITE Negative 02/20/2016 1441   NITRITE NEGATIVE 01/24/2016 2152   LEUKOCYTESUR Negative 02/20/2016 1441   LEUKOCYTESUR Negative 05/04/2012 2052   Sepsis Labs Recent Labs  Lab 05/13/23 2116 05/15/23 0615  WBC 7.9 7.6   Microbiology No results found for this or any previous visit (from the past 240 hour(s)).   Time coordinating discharge: Over 30 minutes  SIGNED:   Azucena Fallen, DO [facility] 05/15/2023, 3:48 PM Pager   If 7PM-7AM, please contact night-coverage [email or web address]

## 2023-05-15 NOTE — Progress Notes (Signed)
Subjective: No further seizures.  Patient states he feels a little weak but denies any other concerns.  Eating breakfast this morning.  Wife at bedside.  ROS: negative except above  Examination  Vital signs in last 24 hours: Temp:  [97.5 F (36.4 C)-99.2 F (37.3 C)] 97.6 F (36.4 C) (11/22 0813) Pulse Rate:  [62-91] 64 (11/22 0813) Resp:  [16-20] 18 (11/22 0813) BP: (133-175)/(85-104) 160/92 (11/22 0813) SpO2:  [88 %-98 %] 97 % (11/22 0813) Weight:  [70.7 kg] 70.7 kg (11/21 1210)  General: lying in bed, NAD Neuro: AOx3, has trouble naming objects but able to repeat and follow commands (baseline per wife), rest of the cranial nerves appear grossly intact, 5/5 in all 4 extremities except 4/5 right lower extremity  Basic Metabolic Panel: Recent Labs  Lab 05/13/23 2115-05-31 05/15/23 0615  NA 137 138  K 3.4* 4.1  CL 99 104  CO2 26 28  GLUCOSE 157* 109*  BUN 14 12  CREATININE 0.88 1.25*  CALCIUM 9.2 8.9    CBC: Recent Labs  Lab 05/13/23 May 31, 2115 05/15/23 0615  WBC 7.9 7.6  NEUTROABS 5.4  --   HGB 16.2 16.4  HCT 46.3 46.4  MCV 82.8 83.0  PLT 232 204     Coagulation Studies: Recent Labs    05/13/23 2115-05-31  LABPROT 13.3  INR 1.0    Imaging personally reviewed MRI brain with and without contrast 05/05/2023: Considerable reduction in abnormal T2 and FLAIR signal/ mass effect in the anterior aspect of the right cerebral hemisphere compared to the prior study. Marked reduction in abnormal enhancement on the right. These findings suggest that much of what we were seeing previously was treatment effect. No evidence of new or worsening finding. Old small vessel ischemic changes within the pons. The previously seen abnormality in the left pons represented a small vessel stroke. No sign of tumor in the pons or cerebellum.  CT head without contrast 05/13/2023: No hemorrhage or CT evidence of an acute cortical infarct. Redemonstrated post-treatment changes in the right frontal  lobe,which are better assessed on recent prior brain MRI dated 05/05/2023.   ASSESSMENT AND PLAN: 52 year old male with breakthrough seizures in the setting of underlying oligodendroglioma.  Oligodendroglioma Epilepsy with breakthrough seizure Acute encephalopathy, improving -Breakthrough seizure likely secondary to underlying tumor. -Acute encephalopathy likely secondary to postictal state  Recommendations -DC LTM EEG as no further seizures -Continue Vimpat 100 mg twice daily and lamotrigine 100 mg every morning, 50 mg nightly.  Can switch Vimpat to p.o. when able to consistently tolerate p.o. -Rescue medication: Intranasal Valtoco 15 mg for seizure lasting more than 2 minutes.  -Continue seizure precautions including no driving for 6 months -Continue to follow-up with Dr. Barbaraann Cao (neuro-oncology) -Discussed plan with patient, wife and Dr. Natale Milch at bedside  Seizure precautions: Per [facility] statutes, patients with seizures are not allowed to drive until they have been seizure-free for six months and cleared by a physician    Use caution when using heavy equipment or power tools. Avoid working on ladders or at heights. Take showers instead of baths. Ensure the water temperature is not too high on the home water heater. Do not go swimming alone. Do not lock yourself in a room alone (i.e. bathroom). When caring for infants or small children, sit down when holding, feeding, or changing them to minimize risk of injury to the child in the event you have a seizure. Maintain good sleep hygiene. Avoid alcohol.    If patient has  another seizure, call 911 and bring them back to the ED if: A.  The seizure lasts longer than 5 minutes.      B.  The patient doesn't wake shortly after the seizure or has new problems such as difficulty seeing, speaking or moving following the seizure C.  The patient was injured during the seizure D.  The patient has a temperature over 102 F (39C) E.  The  patient vomited during the seizure and now is having trouble breathing    During the Seizure   - First, ensure adequate ventilation and place patients on the floor on their left side  Loosen clothing around the neck and ensure the airway is patent. If the patient is clenching the teeth, do not force the mouth open with any object as this can cause severe damage - Remove all items from the surrounding that can be hazardous. The patient may be oblivious to what's happening and may not even know what he or she is doing. If the patient is confused and wandering, either gently guide him/her away and block access to outside areas - Reassure the individual and be comforting - Call 911. In most cases, the seizure ends before EMS arrives. However, there are cases when seizures may last over 3 to 5 minutes. Or the individual may have developed breathing difficulties or severe injuries. If a pregnant patient or a person with diabetes develops a seizure, it is prudent to call an ambulance.     After the Seizure (Postictal Stage)   After a seizure, most patients experience confusion, fatigue, muscle pain and/or a headache. Thus, one should permit the individual to sleep. For the next few days, reassurance is essential. Being calm and helping reorient the person is also of importance.   Most seizures are painless and end spontaneously. Seizures are not harmful to others but can lead to complications such as stress on the lungs, brain and the heart. Individuals with prior lung problems may develop labored breathing and respiratory distress.     I have spent a total of   36 minutes with the patient reviewing hospital notes,  test results, labs and examining the patient as well as establishing an assessment and plan that was discussed personally with the patient.  > 50% of time was spent in direct patient care.        Lindie Spruce Epilepsy [facility] For questions after 5pm please refer to  AMION to reach the Neurologist on call

## 2023-05-15 NOTE — Procedures (Addendum)
Patient Name: Alan Wise  MRN: [id number]  Epilepsy Attending: Charlsie Quest  Referring Physician/Provider: Rejeana Brock, MD  Duration: 05/15/2023 0257 to 05/15/2023 1012   Patient history: 52 yo M with breakthrough seizure in the setting of a known tumor. EEG to evaluate for seizure   Level of alertness: Awake, asleep   AEDs during EEG study: LCM, LTG   Technical aspects: This EEG study was done with scalp electrodes positioned according to the 10-20 International system of electrode placement. Electrical activity was reviewed with band pass filter of 1-70Hz , sensitivity of 7 uV/mm, display speed of 65mm/sec with a 60Hz  notched filter applied as appropriate. EEG data were recorded continuously and digitally stored.  Video monitoring was available and reviewed as appropriate.   Description: The posterior dominant rhythm consists of 9-10 Hz activity of moderate voltage (25-35 uV) seen predominantly in posterior head regions, symmetric and reactive to eye opening and eye closing. Sleep was characterized by vertex waves, sleep spindles (12 to 14 Hz), maximal frontocentral region. EEG showed continuous 3 to 6 Hz theta-delta slowing in right temporal region. Hyperventilation and photic stimulation were not performed.      ABNORMALITY - Continuous slow, right temporal region   IMPRESSION: This study is suggestive of cortical dysfunction arising from right temporal region likely secondary to underlying tumor. No seizures or epileptiform discharges were seen throughout the recording.   Alan Wise

## 2023-05-15 NOTE — Telephone Encounter (Signed)
Patient Product/process development scientist completed.    The patient is insured through [employer]. Patient has [employer], may use a copay card, and/or apply for patient assistance if available.    Ran test claim for Valtoco 15 mg and the current 30 day co-pay is $0.00.   This test claim was processed through [facility]- copay amounts may vary at other pharmacies due to pharmacy/plan contracts, or as the patient moves through the different stages of their insurance plan.     Roland Earl, CPHT Pharmacy Technician III Certified Patient Advocate [facility] Patient Advocate Team Direct Number: [phone]  Fax: [phone]

## 2023-05-16 LAB — GLUCOSE, CAPILLARY: Glucose-Capillary: 129 mg/dL — ABNORMAL HIGH (ref 70–99)

## 2023-05-17 ENCOUNTER — Encounter: Payer: Self-pay | Admitting: Internal Medicine

## 2023-05-18 ENCOUNTER — Encounter: Payer: Self-pay | Admitting: *Deleted

## 2023-05-18 ENCOUNTER — Other Ambulatory Visit: Payer: Self-pay | Admitting: Internal Medicine

## 2023-05-18 MED ORDER — BRIVIACT 50 MG PO TABS: 50.0000 mg | ORAL_TABLET | Freq: Two times a day (BID) | ORAL | 2 refills | Status: DC

## 2023-05-18 NOTE — Telephone Encounter (Signed)
Patient has been experiencing vomiting following dosing of Vimpat.    Keppra was not well tolerated previously.  Recommended trial of Briviact 50mg  BID given breakthrough seizures, refractory to above medications.  Continues to tolerate Lamictal.  Henreitta Leber, MD

## 2023-05-19 ENCOUNTER — Telehealth: Payer: Self-pay

## 2023-05-19 ENCOUNTER — Telehealth: Payer: Self-pay | Admitting: Internal Medicine

## 2023-05-19 ENCOUNTER — Encounter: Payer: Self-pay | Admitting: Internal Medicine

## 2023-05-19 NOTE — Transitions of Care (Post Inpatient/ED Visit) (Signed)
05/19/2023  Name: Alan Wise MRN: [id number] DOB: [birth year]  Today's TOC FU Call Status: Today's TOC FU Call Status:: Successful TOC FU Call Completed TOC FU Call Complete Date: 05/19/23 Patient's Name and Date of Birth confirmed.  Transition Care Management Follow-up Telephone Call How have you been since you were released from the hospital?: Better Any questions or concerns?: Yes Patient Questions/Concerns:: Incrwased n/V contacted MD, see new orders Patient Questions/Concerns Addressed: Other: (reviewed new medications)  Items Reviewed: Did you receive and understand the discharge instructions provided?: Yes Medications obtained,verified, and reconciled?: Yes (Medications Reviewed) Any new allergies since your discharge?: No Dietary orders reviewed?: Yes Type of Diet Ordered:: Reg HeartHealthy, low fat Do you have support at home?: Yes People in Home: spouse Name of Support/Comfort Primary Source: [name]  Medications Reviewed Today: Medications Reviewed Today     Reviewed by Johnnette Barrios, RN (Registered Nurse) on 05/19/23 at 1237  Med List Status: <None>   Medication Order Taking? Sig Documenting Provider Last Dose Status Informant  Brivaracetam (BRIVIACT) 50 MG TABS [id number] Yes Take 50 mg by mouth 2 (two) times daily. Henreitta Leber, MD Taking Active   diazePAM, 15 MG Dose, (VALTOCO 15 MG DOSE) 2 x 7.5 MG/0.1ML LQPK [id number] Yes Place 15 mg into the nose once as needed for up to 1 dose (seizure). Azucena Fallen, MD Taking Active   Lacosamide 100 MG TABS [id number] No Take 1 tablet (100 mg total) by mouth 2 (two) times daily.  Patient not taking: Reported on 05/19/2023   Azucena Fallen, MD Not Taking Active   lamoTRIgine (LAMICTAL) 100 MG tablet [id number] Yes Take 1 tablet (100 mg total) by mouth in the morning AND 0.5 tablets (50 mg total) at bedtime. Azucena Fallen, MD Taking Active             Home Care and  Equipment/Supplies: Were Home Health Services Ordered?: No Any new equipment or medical supplies ordered?: No  Functional Questionnaire: Do you need assistance with bathing/showering or dressing?: No Do you need assistance with meal preparation?: Yes Do you need assistance with eating?: No Do you have difficulty maintaining continence: No Do you need assistance with getting out of bed/getting out of a chair/moving?: Yes (unsteady @ times) Do you have difficulty managing or taking your medications?: No  Follow up appointments reviewed: PCP Follow-up appointment confirmed?: NA Specialist Hospital Follow-up appointment confirmed?: Yes Date of Specialist follow-up appointment?: 05/15/23 Follow-Up Specialty Provider:: Oncology was scheduled while hospitalized, will be rescheduled next week date TBD Do you need transportation to your follow-up appointment?: No (spouse can drive however , he is unable ro drrive x 90 days so appointments may become a issue) Do you understand care options if your condition(s) worsen?: Yes-patient verbalized understanding  SDOH Interventions Today    Flowsheet Row Most Recent Value  SDOH Interventions   Food Insecurity Interventions Intervention Not Indicated  Housing Interventions Intervention Not Indicated  Transportation Interventions Patient Resources (Friends/Family), AMB Referral  [He has a seizure and is restricted from driving x 90 days]  Utilities Interventions Intervention Not Indicated       Goals Addressed             This Visit's Progress    TOC Care Plan       Current Barriers:  Medication management Find medication regime to manage side effects and seizure activity Provider appointments with Oncology and PCP for general follow-up Transportation -pt is  unable to drive x 90  days due to seizure activity Care Coordination needs related to Transportation and Cognitive Deficits Transportation barriers  RNCM Clinical Goal(s):  Patient  will work with the Care Management team over the next 30 days to address Transition of Care Barriers: Support at home Transportation take all medications exactly as prescribed and will call provider for medication related questions as evidenced by no missed medication doses , no uncontrolled side effects  attend all scheduled medical appointments: with Oncology as evidenced by no missed appointments , reschedule appointments if needed  continue to work with RN Care Manager to address care management and care coordination needs related to  transportation  as evidenced by adherence to CM Team Scheduled appointments work with Child psychotherapist to address  related to the Financial planner and Cognitive Deficits related to the management of brain cancer  as evidenced by review of EMR and patient or Child psychotherapist report through collaboration with Medical illustrator, provider, and care team for management of  oligodendroglioma (low grade(3)), 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 Community Resource Referral Made to address Transportation Doctor Visits  - discussed the importance of doctor visits   Status:  New goal.  Short Term Goal Evaluation of current treatment plan related to  Seizure activity  , Transportation and Cognitive Deficits self-management and patient's adherence to plan as established by provider. Discussed plans with patient for ongoing care management follow up and provided patient with direct contact information for care management team Reviewed scheduled/upcoming provider appointments including PP and Oncology Social Work referral for [facility] Transportation needs  Discussed plans with patient for ongoing care management follow up and provided patient with direct contact  information for care management team  Patient Goals/Self-Care Activities: Participate in Transition of Care Program/Attend TOC scheduled calls Take all medications as prescribed Attend all scheduled provider appointments Call pharmacy for medication refills 3-7 days in advance of running out of medications Perform all self care activities independently  Call provider office for new concerns or questions  Work with the social worker to address care coordination needs and will continue to work with the clinical team to address health care and disease management related needs  Follow Up Plan:  The patient has been provided with contact information for the care management team and has been advised to call with any health related questions or concerns.         Routine follow-up and on-going assessment evaluation and education of disease processes, recommended interventions for both chronic and acute medical conditions , will occur during each weekly visit along with ongoing review of symptoms ,medication reviews and reconciliation. Any updates , inconsistencies, discrepancies or acute care concerns will be addressed and routed to the correct Practitioner if indicated   Based on current information and Insurance plan -Reviewed benefits available to patient, including details about eligibility options for care if any area of needs were identified.  Reviewed patients ability to access and / or navigating the benefits system..Amb Referral made for transportation assistance  , refer to orders section of note for details   Please refer to Care Plan for goals and interventions -Effectiveness of interventions, symptom management and outcomes will be evaluated  weekly during [facility] 30-day Program Outreach calls  . Any necessary  changes and updates to Care Plan will be completed  episodically    Reviewed goals for care Patient verbalizes understanding of instructions and care plan provided. Patient was  encouraged to make informed decisions about their care, actively participate in managing their health condition, and implement lifestyle changes as needed to promote independence and self-management of health care  The patient has been provided with contact information for the care management team and has been advised to call with any health-related questions or concerns.   The patient has been provided with contact information for the care management team and has been advised to call with any health-related questions or concerns.   Susa Loffler , BSN, RN Care Management Coordinator [place]   [facility] [email or web address] Direct Dial: [phone]

## 2023-05-19 NOTE — Patient Instructions (Signed)
Visit Information  Thank you for taking time to visit with me today. Please don't hesitate to contact me if I can be of assistance to you before our next scheduled telephone appointment.   Our next appointment is by telephone on 05/25/23 at 12N With [facility]  Following is a copy of your care plan:   Goals Addressed             This Visit's Progress    TOC Care Plan       Current Barriers:  Medication management Find medication regime to manage side effects and seizure activity Provider appointments with Oncology and PCP for general follow-up Transportation -pt is  unable to drive x 90 days due to seizure activity Care Coordination needs related to Transportation and Cognitive Deficits Transportation barriers  RNCM Clinical Goal(s):  Patient will work with the Care Management team over the next 30 days to address Transition of Care Barriers: Support at home Transportation take all medications exactly as prescribed and will call provider for medication related questions as evidenced by no missed medication doses , no uncontrolled side effects  attend all scheduled medical appointments: with Oncology as evidenced by no missed appointments , reschedule appointments if needed  continue to work with RN Care Manager to address care management and care coordination needs related to  transportation  as evidenced by adherence to CM Team Scheduled appointments work with Child psychotherapist to address  related to the Financial planner and Cognitive Deficits related to the management of brain cancer  as evidenced by review of EMR and patient or Child psychotherapist report through collaboration with Medical illustrator, provider, and care team for management of  oligodendroglioma (low grade(3)), 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 Community Resource Referral Made to address Transportation Doctor Visits  - discussed the importance of doctor visits   Status:  New goal.  Short Term Goal Evaluation of current treatment plan related to  Seizure activity  , Transportation and Cognitive Deficits self-management and patient's adherence to plan as established by provider. Discussed plans with patient for ongoing care management follow up and provided patient with direct contact information for care management team Reviewed scheduled/upcoming provider appointments including PP and Oncology Social Work referral for [facility] Transportation needs  Discussed plans with patient for ongoing care management follow up and provided patient with direct contact information for care management team  Patient Goals/Self-Care Activities: Participate in Transition of Care Program/Attend TOC scheduled calls Take all medications as prescribed Attend all scheduled provider appointments Call pharmacy for medication refills 3-7 days in advance of running out of medications Perform all self care activities independently  Call provider office for new concerns or questions  Work with the social worker to address care coordination needs and will continue to work with the clinical team to address health care and disease management related needs  Follow Up Plan:  The patient has been provided with contact information for the care management team and has been advised to call with any health related questions or concerns.          Patient verbalizes understanding of instructions and care plan provided today and agrees to view in MyChart. Active MyChart status and patient understanding of how to access instructions and care plan via MyChart confirmed with patient.  The patient has been provided with contact information for the care management team and has been advised to call with any health related questions or  concerns.   Please call the care guide team at [phone] if you need to cancel or reschedule your appointment.   Please call the [place] National Suicide Prevention Lifeline: 705-487-9878 or TTY: 909-862-1976 TTY 934-868-7471) to talk to a trained counselor if you are experiencing a Mental Health or Behavioral Health Crisis or need someone to talk to.  Susa Loffler , BSN, RN Care Management Coordinator [place]   [facility] [email or web address] Direct Dial: [phone]

## 2023-05-19 NOTE — Telephone Encounter (Signed)
This encounter was created in error - please disregard.

## 2023-05-20 ENCOUNTER — Other Ambulatory Visit: Payer: Self-pay | Admitting: *Deleted

## 2023-05-20 DIAGNOSIS — C719 Malignant neoplasm of brain, unspecified: Secondary | ICD-10-CM

## 2023-05-22 ENCOUNTER — Inpatient Hospital Stay: Payer: BC Managed Care – PPO | Attending: Internal Medicine

## 2023-05-22 ENCOUNTER — Inpatient Hospital Stay: Payer: BC Managed Care – PPO

## 2023-05-22 VITALS — BP 146/83 | HR 68 | Temp 98.0°F | Resp 16

## 2023-05-22 DIAGNOSIS — C719 Malignant neoplasm of brain, unspecified: Secondary | ICD-10-CM | POA: Diagnosis not present

## 2023-05-22 DIAGNOSIS — Z5111 Encounter for antineoplastic chemotherapy: Secondary | ICD-10-CM | POA: Diagnosis present

## 2023-05-22 DIAGNOSIS — I6789 Other cerebrovascular disease: Secondary | ICD-10-CM

## 2023-05-22 LAB — CBC WITH DIFFERENTIAL (CANCER CENTER ONLY)
Abs Immature Granulocytes: 0.01 10*3/uL (ref 0.00–0.07)
Basophils Absolute: 0 10*3/uL (ref 0.0–0.1)
Basophils Relative: 1 %
Eosinophils Absolute: 0.3 10*3/uL (ref 0.0–0.5)
Eosinophils Relative: 5 %
HCT: 44.2 % (ref 39.0–52.0)
Hemoglobin: 15.2 g/dL (ref 13.0–17.0)
Immature Granulocytes: 0 %
Lymphocytes Relative: 20 %
Lymphs Abs: 1.1 10*3/uL (ref 0.7–4.0)
MCH: 29 pg (ref 26.0–34.0)
MCHC: 34.4 g/dL (ref 30.0–36.0)
MCV: 84.4 fL (ref 80.0–100.0)
Monocytes Absolute: 0.4 10*3/uL (ref 0.1–1.0)
Monocytes Relative: 7 %
Neutro Abs: 3.8 10*3/uL (ref 1.7–7.7)
Neutrophils Relative %: 67 %
Platelet Count: 196 10*3/uL (ref 150–400)
RBC: 5.24 MIL/uL (ref 4.22–5.81)
RDW: 12 % (ref 11.5–15.5)
WBC Count: 5.7 10*3/uL (ref 4.0–10.5)
nRBC: 0 % (ref 0.0–0.2)

## 2023-05-22 LAB — CMP (CANCER CENTER ONLY)
ALT: 16 U/L (ref 0–44)
AST: 15 U/L (ref 15–41)
Albumin: 4.3 g/dL (ref 3.5–5.0)
Alkaline Phosphatase: 95 U/L (ref 38–126)
Anion gap: 6 (ref 5–15)
BUN: 14 mg/dL (ref 6–20)
CO2: 30 mmol/L (ref 22–32)
Calcium: 9.3 mg/dL (ref 8.9–10.3)
Chloride: 101 mmol/L (ref 98–111)
Creatinine: 0.88 mg/dL (ref 0.61–1.24)
GFR, Estimated: >60 mL/min (ref >60)
Glucose, Bld: 161 mg/dL — ABNORMAL HIGH (ref 70–99)
Potassium: 4.2 mmol/L (ref 3.5–5.1)
Sodium: 137 mmol/L (ref 135–145)
Total Bilirubin: 0.5 mg/dL (ref <1.2)
Total Protein: 6.7 g/dL (ref 6.5–8.1)

## 2023-05-22 LAB — TOTAL PROTEIN, URINE DIPSTICK: Protein, ur: NEGATIVE mg/dL

## 2023-05-22 MED ORDER — SODIUM CHLORIDE 0.9 % IV SOLN
10.0000 mg/kg | Freq: Once | INTRAVENOUS | Status: AC
  Administered 2023-05-22: 700 mg via INTRAVENOUS
  Filled 2023-05-22: qty 16

## 2023-05-22 MED ORDER — SODIUM CHLORIDE 0.9 % IV SOLN
Freq: Once | INTRAVENOUS | Status: AC
Start: 2023-05-22 — End: 2023-05-22

## 2023-05-22 NOTE — Patient Instructions (Signed)
 [facility] - A DEPT OF [facility]  Discharge Instructions: Thank you for choosing [facility] to provide your oncology and hematology care.   If you have a lab appointment with the Cancer Center, please go directly to the Cancer Center and check in at the registration area.   Wear comfortable clothing and clothing appropriate for easy access to any Portacath or PICC line.   We strive to give you quality time with your provider. You may need to reschedule your appointment if you arrive late (15 or more minutes).  Arriving late affects you and other patients whose appointments are after yours.  Also, if you miss three or more appointments without notifying the office, you may be dismissed from the clinic at the provider's discretion.      For prescription refill requests, have your pharmacy contact our office and allow 72 hours for refills to be completed.    Today you received the following chemotherapy and/or immunotherapy agents: bevacizumab-awwb      To help prevent nausea and vomiting after your treatment, we encourage you to take your nausea medication as directed.  BELOW ARE SYMPTOMS THAT SHOULD BE REPORTED IMMEDIATELY: *FEVER GREATER THAN 100.4 F (38 C) OR HIGHER *CHILLS OR SWEATING *NAUSEA AND VOMITING THAT IS NOT CONTROLLED WITH YOUR NAUSEA MEDICATION *UNUSUAL SHORTNESS OF BREATH *UNUSUAL BRUISING OR BLEEDING *URINARY PROBLEMS (pain or burning when urinating, or frequent urination) *BOWEL PROBLEMS (unusual diarrhea, constipation, pain near the anus) TENDERNESS IN MOUTH AND THROAT WITH OR WITHOUT PRESENCE OF ULCERS (sore throat, sores in mouth, or a toothache) UNUSUAL RASH, SWELLING OR PAIN  UNUSUAL VAGINAL DISCHARGE OR ITCHING   Items with * indicate a potential emergency and should be followed up as soon as possible or go to the Emergency Department if any problems should occur.  Please show the CHEMOTHERAPY ALERT CARD or  IMMUNOTHERAPY ALERT CARD at check-in to the Emergency Department and triage nurse.  Should you have questions after your visit or need to cancel or reschedule your appointment, please contact [facility] - A DEPT OF [facility]  Dept: [phone]  and follow the prompts.  Office hours are 8:00 a.m. to 4:30 p.m. Monday - Friday. Please note that voicemails left after 4:00 p.m. may not be returned until the following business day.  We are closed weekends and major holidays. You have access to a nurse at all times for urgent questions. Please call the main number to the clinic Dept: [phone] and follow the prompts.   For any non-urgent questions, you may also contact your provider using MyChart. We now offer e-Visits for anyone 75 and older to request care online for non-urgent symptoms. For details visit [email or web address].   Also download the MyChart app! Go to the app store, search "MyChart", open the app, select [place], and log in with your MyChart username and password.

## 2023-05-25 ENCOUNTER — Telehealth: Payer: Self-pay

## 2023-05-25 NOTE — Telephone Encounter (Signed)
T/C from pt's wife,[name], requesting an appt for an office visit before the 12/19 appt.  She asked if it could come as a Dr request and not from her. He has been having mental struggles at work, headaches and he will not take his new seizure medication even with the nausea meds.  She asked if it could be a f/up to his new medications  Please advise

## 2023-05-25 NOTE — Telephone Encounter (Signed)
Scheduling called  pt and he declined and asked to review at his next appt on 12/19.  Pt's wife advised

## 2023-05-26 ENCOUNTER — Other Ambulatory Visit: Payer: Self-pay

## 2023-05-26 NOTE — Patient Instructions (Signed)
Visit Information  Thank you for taking time to visit with me today. Please don't hesitate to contact me if I can be of assistance to you before our next scheduled telephone appointment.  Following is a copy of your care plan:   Goals Addressed             This Visit's Progress    COMPLETED: TOC Care Plan       Current Barriers:  Medication management Find medication regime to manage side effects and seizure activity Provider appointments with Oncology and PCP for general follow-up Transportation -pt is  unable to drive x 90 days due to seizure activity Care Coordination needs related to Transportation and Cognitive Deficits Transportation barriers  RNCM Clinical Goal(s):  Patient will work with the Care Management team over the next 30 days to address Transition of Care Barriers: Support at home Transportation take all medications exactly as prescribed and will call provider for medication related questions as evidenced by no missed medication doses , no uncontrolled side effects  attend all scheduled medical appointments: with Oncology as evidenced by no missed appointments , reschedule appointments if needed  continue to work with RN Care Manager to address care management and care coordination needs related to  transportation  as evidenced by adherence to CM Team Scheduled appointments work with Child psychotherapist to address  related to the Financial planner and Cognitive Deficits related to the management of brain cancer  as evidenced by review of EMR and patient or Child psychotherapist report through collaboration with Medical illustrator, provider, and care team for management of  oligodendroglioma (low grade(3)), 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 Community Resource  Referral Made to address Transportation Doctor Visits  - discussed the importance of doctor visits   Status:  New goal.  Short Term Goal Evaluation of current treatment plan related to  Seizure activity  , Transportation and Cognitive Deficits self-management and patient's adherence to plan as established by provider. Discussed plans with patient for ongoing care management follow up and provided patient with direct contact information for care management team Reviewed scheduled/upcoming provider appointments including PP and Oncology Social Work referral for [facility] Transportation needs  Discussed plans with patient for ongoing care management follow up and provided patient with direct contact information for care management team  Patient Goals/Self-Care Activities: Participate in Transition of Care Program/Attend TOC scheduled calls Take all medications as prescribed Attend all scheduled provider appointments Call pharmacy for medication refills 3-7 days in advance of running out of medications Perform all self care activities independently  Call provider office for new concerns or questions  Work with the social worker to address care coordination needs and will continue to work with the clinical team to address health care and disease management related needs  Follow Up Plan:  The patient has been provided with contact information for the care management team and has been advised to call with any health related questions or concerns.          Patient verbalizes understanding of instructions and care plan provided today and agrees to view in MyChart. Active MyChart status and patient understanding of how to access instructions and care plan via MyChart confirmed with patient.     Please call the care guide team at [phone] if you  need to cancel or reschedule your appointment.   Please call the Suicide and Crisis Lifeline: 988 call the [place] National Suicide Prevention Lifeline:  310-554-5980 or TTY: (332)557-9475 TTY 419-040-3202) to talk to a trained counselor if you are experiencing a Mental Health or Behavioral Health Crisis or need someone to talk to.  Deidre Ala, RN Medical illustrator [facility] [phone]

## 2023-05-26 NOTE — Patient Outreach (Signed)
Care Management  Transitions of Care Program Transitions of Care Post-discharge week 2   05/26/2023 Name: Alan Wise MRN: [id number] DOB: 07/03/1970  Subjective: Alan Wise is a 52 y.o. year old male who is a primary care patient of Lorre Munroe, NP. The Care Management team Engaged with patient Engaged with patient by telephone to assess and address transitions of care needs.   Consent to Services:  Patient was given information about care management services, agreed to services, and gave verbal consent to participate.   Assessment: Outreach completed to the patient. He states he was started on Vimpat but due to severe Nausea he is not taking it. Provider is aware. He does not want to move up his Oncology appointment any sooner which is scheduled on 06/11/23. The patient has word finding concerns and mild expressive aphasia at times. Word recall can be elusive as well. Recommended Speech therapy which he declined. Discussed chronic disease and depression. The patient denies and declined referral for Psychology. The patient reports that his tumor shows some response to the treatment which is one positive thing. He is unsure about transportation needs. His insurance does not cover travel to appointments. Recommended talking with the cancer center staff as there is usually a Child psychotherapist who can help. The patient declined further Outreach phone calls.  SDOH Interventions    Flowsheet Row Patient Outreach from 05/26/2023 in [facility] DEPARTMENT Telephone from 05/19/2023 in [facility] POPULATION HEALTH DEPARTMENT Office Visit from 02/16/2023 in [facility] Office Visit from 01/29/2022 in [facility] Office Visit from 01/24/2021 in [facility]  SDOH Interventions       Food Insecurity Interventions Intervention Not Indicated Intervention Not Indicated -- -- --  Housing Interventions  Intervention Not Indicated Intervention Not Indicated -- -- --  Transportation Interventions Intervention Not Indicated Patient Resources (Friends/Family), AMB Referral  [He has a seizure and is restricted from driving x 90 days] -- -- --  Utilities Interventions Intervention Not Indicated Intervention Not Indicated -- -- --  Depression Interventions/Treatment  -- -- PHQ2-9 Score <4 Follow-up Not Indicated Patient refuses Treatment Patient refuses Treatment        Goals Addressed             This Visit's Progress    COMPLETED: TOC Care Plan       Current Barriers:  Medication management Find medication regime to manage side effects and seizure activity Provider appointments with Oncology and PCP for general follow-up Transportation -pt is  unable to drive x 90 days due to seizure activity Care Coordination needs related to Transportation and Cognitive Deficits Transportation barriers  RNCM Clinical Goal(s):  Patient will work with the Care Management team over the next 30 days to address Transition of Care Barriers: Support at home Transportation take all medications exactly as prescribed and will call provider for medication related questions as evidenced by no missed medication doses , no uncontrolled side effects  attend all scheduled medical appointments: with Oncology as evidenced by no missed appointments , reschedule appointments if needed  continue to work with RN Care Manager to address care management and care coordination needs related to  transportation  as evidenced by adherence to CM Team Scheduled appointments work with social worker to address  related to the management of Transportation and Cognitive Deficits related to the management of brain cancer  as evidenced by review of EMR and patient  or Child psychotherapist report through collaboration with Medical illustrator, provider, and care team for management of  oligodendroglioma (low grade(3)), radiation necrosis of brain  following treatment of afore mentioned tumor   Interventions: Evaluation of current treatment plan related to  self management and patient's adherence to plan as established by provider  Transitions of Care:  New goal. Community Resource Referral Made to address Transportation Doctor Visits  - discussed the importance of doctor visits   Status:  New goal.  Short Term Goal Evaluation of current treatment plan related to  Seizure activity  , Transportation and Cognitive Deficits self-management and patient's adherence to plan as established by provider. Discussed plans with patient for ongoing care management follow up and provided patient with direct contact information for care management team Reviewed scheduled/upcoming provider appointments including PP and Oncology Social Work referral for [facility] Transportation needs  Discussed plans with patient for ongoing care management follow up and provided patient with direct contact information for care management team  Patient Goals/Self-Care Activities: Participate in Transition of Care Program/Attend TOC scheduled calls Take all medications as prescribed Attend all scheduled provider appointments Call pharmacy for medication refills 3-7 days in advance of running out of medications Perform all self care activities independently  Call provider office for new concerns or questions  Work with the social worker to address care coordination needs and will continue to work with the clinical team to address health care and disease management related needs  Follow Up Plan:  The patient has been provided with contact information for the care management team and has been advised to call with any health related questions or concerns.         Reviewed goals for care.  Patient verbalizes understanding of instructions and care plan provided. Patient was encouraged to make informed decisions about their care, actively participate in managing their health  condition, and implement lifestyle changes as needed to promote independence and self-management of health care  The patient has been provided with contact information for the care management team and has been advised to call with any health-related questions or concerns. The patient verbalized understanding with current POC. The patient is directed to their insurance card regarding availability of benefits coverage.  Deidre Ala, RN Medical illustrator [facility] [phone]

## 2023-06-10 ENCOUNTER — Other Ambulatory Visit: Payer: Self-pay | Admitting: Internal Medicine

## 2023-06-10 DIAGNOSIS — C719 Malignant neoplasm of brain, unspecified: Secondary | ICD-10-CM

## 2023-06-10 DIAGNOSIS — Y842 Radiological procedure and radiotherapy as the cause of abnormal reaction of the patient, or of later complication, without mention of misadventure at the time of the procedure: Secondary | ICD-10-CM

## 2023-06-11 ENCOUNTER — Inpatient Hospital Stay: Payer: BC Managed Care – PPO | Admitting: Internal Medicine

## 2023-06-11 ENCOUNTER — Inpatient Hospital Stay: Payer: BC Managed Care – PPO

## 2023-06-11 ENCOUNTER — Inpatient Hospital Stay: Payer: BC Managed Care – PPO | Attending: Internal Medicine

## 2023-06-11 VITALS — BP 160/83 | HR 76 | Resp 19

## 2023-06-11 VITALS — BP 172/93 | HR 75 | Temp 97.5°F | Resp 20 | Wt 152.5 lb

## 2023-06-11 DIAGNOSIS — C711 Malignant neoplasm of frontal lobe: Secondary | ICD-10-CM | POA: Insufficient documentation

## 2023-06-11 DIAGNOSIS — C719 Malignant neoplasm of brain, unspecified: Secondary | ICD-10-CM

## 2023-06-11 DIAGNOSIS — E119 Type 2 diabetes mellitus without complications: Secondary | ICD-10-CM | POA: Insufficient documentation

## 2023-06-11 DIAGNOSIS — Z5111 Encounter for antineoplastic chemotherapy: Secondary | ICD-10-CM | POA: Diagnosis present

## 2023-06-11 DIAGNOSIS — Z79899 Other long term (current) drug therapy: Secondary | ICD-10-CM | POA: Insufficient documentation

## 2023-06-11 DIAGNOSIS — Y842 Radiological procedure and radiotherapy as the cause of abnormal reaction of the patient, or of later complication, without mention of misadventure at the time of the procedure: Secondary | ICD-10-CM

## 2023-06-11 DIAGNOSIS — Z87442 Personal history of urinary calculi: Secondary | ICD-10-CM | POA: Diagnosis not present

## 2023-06-11 DIAGNOSIS — E785 Hyperlipidemia, unspecified: Secondary | ICD-10-CM | POA: Insufficient documentation

## 2023-06-11 DIAGNOSIS — R4701 Aphasia: Secondary | ICD-10-CM | POA: Diagnosis not present

## 2023-06-11 DIAGNOSIS — Z923 Personal history of irradiation: Secondary | ICD-10-CM | POA: Diagnosis not present

## 2023-06-11 DIAGNOSIS — K219 Gastro-esophageal reflux disease without esophagitis: Secondary | ICD-10-CM | POA: Diagnosis not present

## 2023-06-11 DIAGNOSIS — I6789 Other cerebrovascular disease: Secondary | ICD-10-CM

## 2023-06-11 DIAGNOSIS — Z9221 Personal history of antineoplastic chemotherapy: Secondary | ICD-10-CM | POA: Insufficient documentation

## 2023-06-11 LAB — CBC WITH DIFFERENTIAL (CANCER CENTER ONLY)
Abs Immature Granulocytes: 0.01 10*3/uL (ref 0.00–0.07)
Basophils Absolute: 0.1 10*3/uL (ref 0.0–0.1)
Basophils Relative: 1 %
Eosinophils Absolute: 0.4 10*3/uL (ref 0.0–0.5)
Eosinophils Relative: 6 %
HCT: 46 % (ref 39.0–52.0)
Hemoglobin: 15.8 g/dL (ref 13.0–17.0)
Immature Granulocytes: 0 %
Lymphocytes Relative: 19 %
Lymphs Abs: 1.3 10*3/uL (ref 0.7–4.0)
MCH: 28.8 pg (ref 26.0–34.0)
MCHC: 34.3 g/dL (ref 30.0–36.0)
MCV: 83.8 fL (ref 80.0–100.0)
Monocytes Absolute: 0.5 10*3/uL (ref 0.1–1.0)
Monocytes Relative: 8 %
Neutro Abs: 4.4 10*3/uL (ref 1.7–7.7)
Neutrophils Relative %: 66 %
Platelet Count: 193 10*3/uL (ref 150–400)
RBC: 5.49 MIL/uL (ref 4.22–5.81)
RDW: 12.1 % (ref 11.5–15.5)
WBC Count: 6.7 10*3/uL (ref 4.0–10.5)
nRBC: 0 % (ref 0.0–0.2)

## 2023-06-11 LAB — TOTAL PROTEIN, URINE DIPSTICK: Protein, ur: 100 mg/dL — AB

## 2023-06-11 MED ORDER — SODIUM CHLORIDE 0.9 % IV SOLN
10.0000 mg/kg | Freq: Once | INTRAVENOUS | Status: AC
  Administered 2023-06-11: 700 mg via INTRAVENOUS
  Filled 2023-06-11: qty 12

## 2023-06-11 MED ORDER — PROPRANOLOL HCL 20 MG PO TABS: 20.0000 mg | ORAL_TABLET | Freq: Two times a day (BID) | ORAL | 1 refills | Status: DC

## 2023-06-11 MED ORDER — LAMOTRIGINE 100 MG PO TABS: 100.0000 mg | ORAL_TABLET | Freq: Two times a day (BID) | ORAL | 2 refills | Status: DC

## 2023-06-11 MED ORDER — TOPIRAMATE 50 MG PO TABS: 50.0000 mg | ORAL_TABLET | Freq: Every day | ORAL | 1 refills | Status: DC

## 2023-06-11 MED ORDER — SODIUM CHLORIDE 0.9 % IV SOLN: Freq: Once | INTRAVENOUS | Status: AC

## 2023-06-11 NOTE — Patient Instructions (Signed)
[facility] - A DEPT OF [facility]  Discharge Instructions: Thank you for choosing [facility] to provide your oncology and hematology care.   If you have a lab appointment with the Cancer Center, please go directly to the Cancer Center and check in at the registration area.   Wear comfortable clothing and clothing appropriate for easy access to any Portacath or PICC line.   We strive to give you quality time with your provider. You may need to reschedule your appointment if you arrive late (15 or more minutes).  Arriving late affects you and other patients whose appointments are after yours.  Also, if you miss three or more appointments without notifying the office, you may be dismissed from the clinic at the provider's discretion.      For prescription refill requests, have your pharmacy contact our office and allow 72 hours for refills to be completed.    Today you received the following chemotherapy and/or immunotherapy agents: bevacizumab      To help prevent nausea and vomiting after your treatment, we encourage you to take your nausea medication as directed.  BELOW ARE SYMPTOMS THAT SHOULD BE REPORTED IMMEDIATELY: *FEVER GREATER THAN 100.4 F (38 C) OR HIGHER *CHILLS OR SWEATING *NAUSEA AND VOMITING THAT IS NOT CONTROLLED WITH YOUR NAUSEA MEDICATION *UNUSUAL SHORTNESS OF BREATH *UNUSUAL BRUISING OR BLEEDING *URINARY PROBLEMS (pain or burning when urinating, or frequent urination) *BOWEL PROBLEMS (unusual diarrhea, constipation, pain near the anus) TENDERNESS IN MOUTH AND THROAT WITH OR WITHOUT PRESENCE OF ULCERS (sore throat, sores in mouth, or a toothache) UNUSUAL RASH, SWELLING OR PAIN  UNUSUAL VAGINAL DISCHARGE OR ITCHING   Items with * indicate a potential emergency and should be followed up as soon as possible or go to the Emergency Department if any problems should occur.  Please show the CHEMOTHERAPY ALERT CARD or  IMMUNOTHERAPY ALERT CARD at check-in to the Emergency Department and triage nurse.  Should you have questions after your visit or need to cancel or reschedule your appointment, please contact [facility] - A DEPT OF [facility]  Dept: [phone]  and follow the prompts.  Office hours are 8:00 a.m. to 4:30 p.m. Monday - Friday. Please note that voicemails left after 4:00 p.m. may not be returned until the following business day.  We are closed weekends and major holidays. You have access to a nurse at all times for urgent questions. Please call the main number to the clinic Dept: [phone] and follow the prompts.   For any non-urgent questions, you may also contact your provider using MyChart. We now offer e-Visits for anyone 81 and older to request care online for non-urgent symptoms. For details visit [email or web address].   Also download the MyChart app! Go to the app store, search "MyChart", open the app, select [facility], and log in with your MyChart username and password.

## 2023-06-11 NOTE — Progress Notes (Signed)
[facility] [address]  [place], [place] [address] [phone]  Interval Evaluation  Date of Service: 06/11/23 Patient Name: Alan Wise Patient MRN: [id number] Patient DOB: 27-Oct-1970 Provider: Henreitta Leber, MD  Identifying Statement:  Alan Wise is a 52 y.o. 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 Maisie Fus; path demonstrates IDH-1 mutant oligodendroglioma   03/20/2021 - 04/30/2021 Radiation Therapy   Completes 54 Gy IMRT and Temodar with Dr. Basilio Cairo   06/02/2021 - 03/05/2022 Chemotherapy   Completes 8 cycles of adjuvant 5-day Temozolomide   02/01/2022 Progression   Progression of disease in multifocal/metastatic/lmd pattern   03/11/2022 Progression   Progression of disease confirmed after additional cycle of TMZ, 1 month follow up MRI.  LP/cytology and MRI spine mets screening unremarkable   04/10/2022 Surgery   Stereotactic biopsy of progressive R frontal lesion at [facility] with Dr. Zachery Conch.  Path demonstrates radiation necrosis   12/03/2022 Surgery   Repeat biopsy with Dr. Zachery Conch, R frontal.  Path again demonstrates necrosis and treatment effect   03/12/2023 -  Chemotherapy   Patient is on Treatment Plan : BRAIN GBM Bevacizumab 14d x 6 cycles       Biomarkers:   Interval History: Alan Wise presents today for cycle #4 of avastin.  No further seizures since discharge; was unable to tolerate Vimpat, is only dosing Lamictal 100/50.  Never started the Briviact which was ordered.  Cognition continues decline, consistently over the past 4-6 months per patient and his wife.  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  Still having "bad" headaches 2-3x per week, dosing Tylenol which is helpful.   H+P (12/31/20) Patient presented to medical attention this past month, with complaint of  involuntary left sided shaking.  He describes episodes, occurring several times per week, of left arm shaking, lasting up to 5 minutes; following the episodes he describes the arm as "heavy" for some time before returning to normal.  He also describes episodes of sudden inability to speak, with strange movements of face and hands, lasting same amount of time.  These episodes go back ~10 years in very sporadic nature, but have become much more frequent in recent months.  He works full time as a Agricultural consultant at [employer].  These episodes have occurred at work, and have been disruptive in that environment.  He is otherwise fully functional, independent.  Medications: Current Outpatient Medications on File Prior to Visit  Medication Sig Dispense Refill   Brivaracetam (BRIVIACT) 50 MG TABS Take 50 mg by mouth 2 (two) times daily. 60 tablet 2   diazePAM, 15 MG Dose, (VALTOCO 15 MG DOSE) 2 x 7.5 MG/0.1ML LQPK Place 15 mg into the nose once as needed for up to 1 dose (seizure). 2 each 0   Lacosamide 100 MG TABS Take 1 tablet (100 mg total) by mouth 2 (two) times daily. (Patient not taking: Reported on 05/19/2023) 60 tablet 0   lamoTRIgine (LAMICTAL) 100 MG tablet Take 1 tablet (100 mg total) by mouth in the morning AND 0.5 tablets (50 mg total) at bedtime. 45 tablet 0   No current facility-administered medications on file prior to visit.    Allergies:  Allergies  Allergen Reactions   Contrast Media [Iodinated Contrast Media] Swelling   Past Medical History:  Past Medical History:  Diagnosis Date   Allergy  Complication of anesthesia    pt reports he is starting to have more difficulty arousing after surgery   Diabetes mellitus (HCC)    GERD (gastroesophageal reflux disease)    Headache    History of kidney stones    Hyperlipidemia    Renal disorder    kidney stones   Past Surgical History:  Past Surgical History:  Procedure Laterality Date   APPLICATION OF CRANIAL NAVIGATION  Right 01/17/2021   Procedure: APPLICATION OF CRANIAL NAVIGATION;  Surgeon: Bedelia Person, MD;  Location: [facility] OR;  Service: Neurosurgery;  Laterality: Right;   COLONOSCOPY  09/03/1994   COLONOSCOPY WITH PROPOFOL N/A 01/07/2023   Procedure: COLONOSCOPY WITH PROPOFOL;  Surgeon: [name], MD;  Location: [facility] ENDOSCOPY;  Service: Endoscopy;  Laterality: N/A;  NOT TOO EARLY   CYSTOSCOPY WITH STENT PLACEMENT Right 01/25/2016   Procedure: CYSTOSCOPY WITH STENT PLACEMENT;  Surgeon: Hildred Laser, MD;  Location: [facility] ORS;  Service: Urology;  Laterality: Right;   FRAMELESS  BIOPSY WITH BRAINLAB Right 01/17/2021   Procedure: RIGHT STEREOTACTIC BIOPSY OF INSULAR LESION;  Surgeon: Bedelia Person, MD;  Location: [facility] OR;  Service: Neurosurgery;  Laterality: Right;   KNEE ARTHROSCOPY W/ MENISCAL REPAIR Right 06/23/1988   POLYPECTOMY  01/07/2023   Procedure: POLYPECTOMY;  Surgeon: [name], MD;  Location: [facility] ENDOSCOPY;  Service: Endoscopy;;   removal of birthmark  06/08/2013   SKIN CANCER EXCISION  06/23/2012   TYMPANOSTOMY TUBE PLACEMENT  08/06/1978   URETEROSCOPY WITH HOLMIUM LASER LITHOTRIPSY Right 01/25/2016   Procedure: URETEROSCOPY WITH HOLMIUM LASER LITHOTRIPSY;  Surgeon: Hildred Laser, MD;  Location: [facility] ORS;  Service: Urology;  Laterality: Right;   URETHRAL STRICTURE DILATATION     visual inspection of vocal cord     1973   WISDOM TOOTH EXTRACTION     Social History:  Social History   Socioeconomic History   Marital status: Married    Spouse name: Not on file   Number of children: Not on file   Years of education: Not on file   Highest education level: Not on file  Occupational History   Not on file  Tobacco Use   Smoking status: Never   Smokeless tobacco: Never  Vaping Use   Vaping status: Never Used  Substance and Sexual Activity   Alcohol use: Not Currently    Comment: rare   Drug use: No   Sexual activity: Not on file  Other Topics Concern   Not on file   Social History Narrative   Not on file   Social Drivers of Health   Financial Resource Strain: Low Risk  (12/05/2022)   [facility]   Overall Financial Resource Strain (CARDIA)    Difficulty of Paying Living Expenses: Not hard at all  Food Insecurity: No Food Insecurity (05/26/2023)   Hunger Vital Sign    Worried About Running Out of Food in the Last Year: Never true    Ran Out of Food in the Last Year: Never true  Transportation Needs: No Transportation Needs (05/26/2023)   PRAPARE - Administrator, Civil Service (Medical): No    Lack of Transportation (Non-Medical): No  Physical Activity: Not on file  Stress: Not on file  Social Connections: Not on file  Intimate Partner Violence: Not At Risk (05/26/2023)   Humiliation, Afraid, Rape, and Kick questionnaire    Fear of Current or Ex-Partner: No    Emotionally Abused: No  Physically Abused: No    Sexually Abused: No   Family History:  Family History  Problem Relation Age of Onset   Hyperlipidemia Father    Hypertension Father    Diabetes Father    Benign prostatic hyperplasia Father    Stroke Maternal Grandmother    Diabetes Maternal Grandmother    Stroke Paternal Grandmother    Hypertension Paternal Grandmother    Kidney disease Neg Hx    Prostate cancer Neg Hx     Review of Systems: Constitutional: Doesn't report fevers, chills or abnormal weight loss Eyes: Doesn't report blurriness of vision Ears, nose, mouth, throat, and face: Doesn't report sore throat Respiratory: Doesn't report cough, dyspnea or wheezes Cardiovascular: Doesn't report palpitation, chest discomfort  Gastrointestinal:  Doesn't report nausea, constipation, diarrhea GU: Doesn't report incontinence Skin: Doesn't report skin rashes Neurological: Per HPI Musculoskeletal: Doesn't report joint pain Behavioral/Psych: Doesn't report anxiety  Physical Exam: Vitals:   06/11/23  1026  BP: (!) 172/93  Pulse: 75  Resp: 20  Temp: (!) 97.5 F (36.4 C)  SpO2: 99%   KPS: 70. General: Fatigued Head: Normal EENT: No conjunctival injection or scleral icterus.  Lungs: Resp effort normal Cardiac: Regular rate Abdomen: Non-distended abdomen Skin: No rashes cyanosis or petechiae. Extremities: No clubbing or edema  Neurologic Exam: Mental Status: Awake, alert, attentive to examiner. Oriented to self and environment.  Language is notable for impaired fluency, with intact comprehension.  Modest psychomotor slowing, impaired 3 object recall Cranial Nerves: Visual acuity is grossly normal. Visual fields are full. Extra-ocular movements intact. No ptosis. Face is symmetric Motor: Tone and bulk are normal. Power is full in both arms and legs. Reflexes are symmetric, no pathologic reflexes present.  Sensory: Intact to light touch Gait: Normal.   Labs: I have reviewed the data as listed    Component Value Date/Time   NA 137 05/22/2023 0936   NA 138 05/04/2012 1815   K 4.2 05/22/2023 0936   K 3.5 05/04/2012 1815   CL 101 05/22/2023 0936   CL 99 05/04/2012 1815   CO2 30 05/22/2023 0936   CO2 29 05/04/2012 1815   GLUCOSE 161 (H) 05/22/2023 0936   GLUCOSE 69 05/04/2012 1815   BUN 14 05/22/2023 0936   BUN 12 05/04/2012 1815   CREATININE 0.88 05/22/2023 0936   CREATININE 0.90 11/14/2022 1025   CALCIUM 9.3 05/22/2023 0936   CALCIUM 9.2 05/04/2012 1815   PROT 6.7 05/22/2023 0936   PROT 8.5 (H) 05/04/2012 1815   ALBUMIN 4.3 05/22/2023 0936   ALBUMIN 4.5 05/04/2012 1815   AST 15 05/22/2023 0936   ALT 16 05/22/2023 0936   ALT 77 05/04/2012 1815   ALKPHOS 95 05/22/2023 0936   ALKPHOS 129 05/04/2012 1815   BILITOT 0.5 05/22/2023 0936   GFRNONAA >60 05/22/2023 0936   GFRNONAA 106 12/05/2020 1014   GFRAA 123 12/05/2020 1014   Lab Results  Component Value Date   WBC 6.7 06/11/2023   NEUTROABS 4.4 06/11/2023   HGB 15.8 06/11/2023   HCT 46.0 06/11/2023   MCV 83.8  06/11/2023   PLT 193 06/11/2023     Pathology report from June 2024 biopsy: A-B. Brain tissue, right frontal lesion, biopsy:   Brain tissue with changes consistent with treatment effect; see comment.       Comment: There is no definitive morphologic, immunophenotypic, or molecular evidence of the patient's known oligodendroglioma, IDH-mutant and 1p/19q-codeleted; see comment. The specimen is negative for mutations in the TERT promoter and in  IDH1/2 by [employer] sequencing.       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  Blood pressure remains elevated likely secondary to avastin.  Seizures are well controlled thus far on Lacmictal 100/50, though we expressed concern for underdosing, given that Vimpat was stopped and not replaced with another AED.  He remains at elevated risk for breakthrough seizures.  Cognitive and language impairments are again noted today.  He is really struggling to function at work.  These symptoms are likely secondary to delayed effects of radiation and toxicity of treatment.  No sudden changes concerning for subclinical seizures, and iatrogenic effects are not suspected at this time.  MRI brain from November showed very good response to avastin therapy.    Recommended continuing with cycle #4 avastin 10mg /kg IV q4 weeks.  We reviewed side effects including hypertension, wound healing impairment.  Urine protein will be checked every other cycle in addition to CBC.  Informed consent was obtained verbally at bedside to proceed with antibody therapy.  Avastin should be held for the following:  ANC less than 500  Platelets less than 50,000  LFT or creatinine greater than 2x ULN  If clinical  concerns/contraindications develop  Discussed and recommended the following: -Increasing Lamictal to 100mg  BID given lack of Vimpat replacement -Trial of Propanolol 20mg  BID which may help with headache prevention as well as blood pressure -Referral to speech therapy -Referral to social work for goals of care counseling, t/c transition to disability -MRI brain prior to next infusion in 4 weeks -Plan to continue avastin indefinitely given good response  We ask that Leeanne Mannan return to clinic in 4 weeks following MRI brain, for cycle #5 avastin, or sooner if needed.  All questions were answered. The patient knows to call the clinic with any problems, questions or concerns. No barriers to learning were detected.  The total time spent in the encounter was 40 minutes and more than 50% was on counseling and review of test results   Henreitta Leber, MD Medical Director of Neuro-Oncology [facility] 06/11/23 10:42 AM

## 2023-06-12 ENCOUNTER — Inpatient Hospital Stay: Payer: BC Managed Care – PPO | Admitting: Licensed Clinical Social Worker

## 2023-06-12 DIAGNOSIS — C719 Malignant neoplasm of brain, unspecified: Secondary | ICD-10-CM

## 2023-06-12 NOTE — Progress Notes (Signed)
[facility] CSW Progress Note  Clinical Child psychotherapist contacted patient by phone to discuss questions regarding disability.  Pt will check w/ HR to see what his short term and long term disability benefits are through [facility].  Pt's next appointment is 1/16.  CSW to meet w/ pt in infusion to sign [facility] referral.  CSW spoke briefly w/ pt regarding his children and communicating about his disease process w/ his children.  CSW to discuss sitting down w/ both pt and his spouse to discuss concerns regarding children as well as pt's recent decline at infusion appointment on 1/16.    Rachel Moulds, LCSW Clinical Social Worker [facility]

## 2023-06-22 ENCOUNTER — Encounter: Payer: Self-pay | Admitting: Internal Medicine

## 2023-06-29 ENCOUNTER — Encounter: Payer: Self-pay | Admitting: Internal Medicine

## 2023-06-30 ENCOUNTER — Telehealth: Payer: Self-pay | Admitting: *Deleted

## 2023-06-30 NOTE — Telephone Encounter (Signed)
 PC to patient, no answer, left VM - informed him his brain MRI is scheduled for 07/06/23 at 10:00 at [facility], he should arrive at 9:30.  Instructed patient to call this office with any questions/concerns, [phone].

## 2023-07-02 ENCOUNTER — Ambulatory Visit: Payer: 59 | Attending: Internal Medicine | Admitting: Speech Pathology

## 2023-07-02 DIAGNOSIS — C719 Malignant neoplasm of brain, unspecified: Secondary | ICD-10-CM | POA: Diagnosis present

## 2023-07-02 DIAGNOSIS — R4701 Aphasia: Secondary | ICD-10-CM

## 2023-07-02 DIAGNOSIS — R41841 Cognitive communication deficit: Secondary | ICD-10-CM | POA: Diagnosis present

## 2023-07-02 NOTE — Therapy (Signed)
 OUTPATIENT SPEECH LANGUAGE PATHOLOGY  COGNITION EVALUATION   Patient Name: Alan Wise MRN: [id number] DOB:11-24-1970, 53 y.o., male Today's Date: 07/02/2023  PCP: Angeline Laura, NP REFERRING PROVIDER: Arthea Manns, MD   End of Session - 07/02/23 1633     Visit Number 1    Number of Visits 9    Date for SLP Re-Evaluation 08/27/23    Authorization Type [employer]    Progress Note Due on Visit 10    SLP Start Time 1145    SLP Stop Time  1235    SLP Time Calculation (min) 50 min    Activity Tolerance Patient tolerated treatment well             Past Medical History:  Diagnosis Date   Allergy    Complication of anesthesia    pt reports he is starting to have more difficulty arousing after surgery   Diabetes mellitus (HCC)    GERD (gastroesophageal reflux disease)    Headache    History of kidney stones    Hyperlipidemia    Renal disorder    kidney stones   Past Surgical History:  Procedure Laterality Date   APPLICATION OF CRANIAL NAVIGATION Right 01/17/2021   Procedure: APPLICATION OF CRANIAL NAVIGATION;  Surgeon: Debby Dorn MATSU, MD;  Location: [facility] OR;  Service: Neurosurgery;  Laterality: Right;   COLONOSCOPY  09/03/1994   COLONOSCOPY WITH PROPOFOL  N/A 01/07/2023   Procedure: COLONOSCOPY WITH PROPOFOL ;  Surgeon: Jinny Carmine, MD;  Location: [facility] ENDOSCOPY;  Service: Endoscopy;  Laterality: N/A;  NOT TOO EARLY   CYSTOSCOPY WITH STENT PLACEMENT Right 01/25/2016   Procedure: CYSTOSCOPY WITH STENT PLACEMENT;  Surgeon: Redell Lynwood Napoleon, MD;  Location: [facility] ORS;  Service: Urology;  Laterality: Right;   FRAMELESS  BIOPSY WITH BRAINLAB Right 01/17/2021   Procedure: RIGHT STEREOTACTIC BIOPSY OF INSULAR LESION;  Surgeon: Debby Dorn MATSU, MD;  Location: [facility] OR;  Service: Neurosurgery;  Laterality: Right;   KNEE ARTHROSCOPY W/ MENISCAL REPAIR Right 06/23/1988   POLYPECTOMY  01/07/2023   Procedure: POLYPECTOMY;  Surgeon: Jinny Carmine, MD;  Location: [facility]  ENDOSCOPY;  Service: Endoscopy;;   removal of birthmark  06/08/2013   SKIN CANCER EXCISION  06/23/2012   TYMPANOSTOMY TUBE PLACEMENT  08/06/1978   URETEROSCOPY WITH HOLMIUM LASER LITHOTRIPSY Right 01/25/2016   Procedure: URETEROSCOPY WITH HOLMIUM LASER LITHOTRIPSY;  Surgeon: Redell Lynwood Napoleon, MD;  Location: [facility] ORS;  Service: Urology;  Laterality: Right;   URETHRAL STRICTURE DILATATION     visual inspection of vocal cord     1973   WISDOM TOOTH EXTRACTION     Patient Active Problem List   Diagnosis Date Noted   Status epilepticus (HCC) 05/14/2023   DM2 (diabetes mellitus, type 2) (HCC) 05/14/2023   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   Focal seizures (HCC) 12/31/2020   Type 2 diabetes mellitus with hyperglycemia (HCC) 06/12/2014   Hyperlipidemia associated with type 2 diabetes mellitus (HCC) 06/12/2014    ONSET DATE: 12/03/2022; date of referral 06/11/2023  REFERRING DIAG: R47.01 (ICD-10-CM) - Aphasia   THERAPY DIAG:  Cognitive communication deficit  Aphasia  Oligodendroglioma (HCC)  Rationale for Evaluation and Treatment Rehabilitation  SUBJECTIVE:   SUBJECTIVE STATEMENT: Pt eager, accompanied by his wife who helps supplement pt's history Pt accompanied by: significant other  PERTINENT HISTORY and DIAGNOSTIC FINDINGS:   Alan Wise is a left handed 53 year old male with right frontal oligodendroglioma. The below is his oncology history  with pt's wife reports language impairment following right frontal biopsy on 12/03/2022. 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   01/17/2021 Surgery    Stereotactic biopsy with Dr. Debby; path demonstrates IDH-1 mutant oligodendroglioma    03/20/2021 - 04/30/2021 Radiation Therapy    Completes 54 Gy IMRT and Temodar  with Dr. Izell    06/02/2021 - 03/05/2022 Chemotherapy    Completes 8 cycles of  adjuvant 5-day Temozolomide     02/01/2022 Progression    Progression of disease in multifocal/metastatic/lmd pattern    03/11/2022 Progression    Progression of disease confirmed after additional cycle of TMZ, 1 month follow up MRI.  LP/cytology and MRI spine mets screening unremarkable    04/10/2022 Surgery    Stereotactic biopsy of progressive R frontal lesion at [facility] with Dr. Saturnino.  Path demonstrates radiation necrosis    12/03/2022 Surgery    Repeat biopsy with Dr. Saturnino, R frontal.  Path again demonstrates necrosis and treatment effect    03/12/2023 -  Chemotherapy    Patient is on Treatment Plan : BRAIN GBM Bevacizumab  14d x 6 cycles     05/05/2023 IMPRESSION: 1. Considerable reduction in abnormal T2 and FLAIR signal/ mass effect in the anterior aspect of the right cerebral hemisphere compared to the prior study. Marked reduction in abnormal enhancement on the right. These findings suggest that much of what we were seeing previously was treatment effect. No evidence of new or worsening finding. 2. Old small vessel ischemic changes within the pons. The previously seen abnormality in the left pons represented a small vessel stroke. No sign of tumor in the pons or cerebellum.   PAIN:  Are you having pain? No   FALLS: Has patient fallen in last 6 months?  No  LIVING ENVIRONMENT: Lives with: lives with their family Lives in: House/apartment  PLOF:  Level of assistance: Needed assistance with ADLs, Needed assistance with IADLS Employment: Other: was working full-time but unable to do so at this time d/t cancer   PATIENT GOALS   to improve verbal communication  OBJECTIVE:   COGNITIVE COMMUNICATION Overall cognitive status: Impaired Areas of impairment:  Attention: Impaired: Divided Memory: Impaired: Working Teacher, Music term Prospective Awareness: Impaired: Emergent and Anticipatory Executive function: Impaired: Error awareness Behavior:  frustration Impaired Functional Impairments: pt with subjective report of decreased fields of vision, decreased recall of information and activities and some decreased recall of object use  AUDITORY COMPREHENSION  Overall auditory comprehension: Impaired: moderately complex and complex YES/NO questions: Impaired: moderately complex and complex Following directions: Impaired: moderately complex and complex Conversation: Simple Interfering components: visual impairments and working Research Scientist (life Sciences): repetition/stressing words  READING COMPREHENSION: Impaired: sentence  EXPRESSION: verbal  VERBAL EXPRESSION:   Overall verbal expression: Impaired: simple Level of generative/spontaneous verbalization: sentence Automatic speech: name: intact and social response: impaired  Repetition: Appears intact Naming: Responsive: 26-50%, Confrontation: 26-50%, Convergent: 26-50%, and Divergent: 0-25% Pragmatics: Impaired: abnormal effect, eye contact, interpretation of nonverbal communication, and turn taking Interfering components:  Effective technique: communication partner support Non-verbal means of communication: N/A  WRITTEN EXPRESSION: Dominant hand: left Written expression: Impaired: sentence  ORAL MOTOR EXAMINATION Facial : WFL Lingual: WFL Velum: WFL Mandible: WFL Cough: WFL Voice: WFL  MOTOR SPEECH: Overall motor speech: Appears intact Respiration: diaphragmatic/abdominal breathing Phonation: normal Resonance: WFL Articulation: Appears intact Intelligibility: Intelligible Motor planning: Appears intact  STANDARDIZED ASSESSMENTS:   Western Aphasia Battery- Revised  Spontaneous Speech  Information content               8/10                                            Fluency                                 4/10                                          Comprehension     Yes/No questions                 51/60                                            Auditory Word Recognition  54/60                                Sequential Commands       26/80                              Repetition                             84/100                                        Naming    Object Naming                     48/60                                           Word Fluency                        0/20                                            Sentence Completion          8/10                                             Responsive Speech              6/10                                         Aphasia Quotient  78.2/100         Pt's severity rating was MILD as indicated by an Aphasia Quotient of 78.2 (0-25=very severe, 26-50=severe, 51-75=moderate, 76 and above is mild). Pt's presentation is most consistent with transcortical subtype. See the impressions statement below.     PATIENT REPORTED OUTCOME MEASURES (PROM): To be completed over the course of next 3 sessions   TODAY'S TREATMENT:  N/A   PATIENT EDUCATION: Education details: cognitive communication/language impairment Person educated: Patient and Spouse Education method: Explanation Education comprehension: needs further education   HOME EXERCISE PROGRAM:   N/A   GOALS:  Goals reviewed with patient? Yes  SHORT TERM GOALS: Target date: 10 sessions   With Min A, Patient will answer abstract yes/no questions (e.g. are there 20 hours in one day?) with 80% in order to increase ability to communicate with family members accurately.  Baseline: Goal status: INITIAL  2.  With Min A, patient will name body parts at 75 % accuracy to increase ability to communicate with family members accurately.  Baseline:  Goal status: INITIAL   LONG TERM GOALS: Target date: 08/27/2023  With Min A, patient will demonstrate knowledge of appropriate activities to support cognitive and  language function outside of ST with assistance from family.  Baseline:  Goal  status: INITIAL  2.  With Min A, patient/family will demonstrate understanding of the following concepts: aphasia, communication vs conversation, strengths/strategies to promote success, local resources by answering multiple choice questions with 80% accuracy when provided supported conversation in order to increase patient's participation in medical care.  Baseline:  Goal status: INITIAL   ASSESSMENT:  CLINICAL IMPRESSION: Patient is a 53 y.o. left handed male who was seen today for a cognitive communication evaluation d/t report of aphasia in the setting of right frontal oligodendroglioma.   Pt presents with expressive > receptive difficulties d/t cognitive communication impairment related to . His verbal communication is fluent with evidence of word finding difficulty, use of nonspecific descriptions and hierarchical cues not helpful in word retrieval.  He frequently looked to his wife and asked, what am I trying to say or waht's te word for that? They also report that pt has started exhibiting decreased knowledge of object function as he had difficulty knowing how to use a knife. Higher level comprehension tasks such as more ocmplex multi-step directions, complex yes/no questions were also more difficult with pt and his wife reporting difficulty with reading comprehension and formulation of grammatically correct sentences on emails. His wife reports that his typed sentences are reflective of verbal difficulties. They also report some memory loss, specifically short term memory difficulty and recent difficulty navigating some social situations. Pt is intermittently aware of the above deficits. Skilled ST services are recommended to finish pt's cognitive communication evaluation and for therapy likely targeting compensatory therapy.   OBJECTIVE IMPAIRMENTS include attention, memory, awareness, executive functioning, expressive language, and receptive language. These impairments are limiting patient  from return to work, managing medications, managing appointments, managing finances, household responsibilities, ADLs/IADLs, and effectively communicating at home and in community. Factors affecting potential to achieve goals and functional outcome are ability to learn/carryover information, co-morbidities, medical prognosis, and severity of impairments. Patient will benefit from skilled SLP services to address above impairments and improve overall function.  REHAB POTENTIAL: Fair prognosis, severity of impairments, time post onset  PLAN: SLP FREQUENCY: 1-2x/week  SLP DURATION: 8 weeks  PLANNED INTERVENTIONS: Language facilitation, Cueing hierachy, Cognitive reorganization, Internal/external aids, Functional tasks, Multimodal communication approach, SLP instruction and  feedback, Compensatory strategies, Patient/family education, and [id number] Treatment of speech (30 or 45 min)   Allante Beane B. Rubbie, M.S., CCC-SLP, Tree Surgeon Certified Brain Injury Specialist [facility]  [facility] Rehabilitation Services Office [phone] Ascom [phone] Fax [phone]

## 2023-07-03 ENCOUNTER — Other Ambulatory Visit: Payer: Self-pay

## 2023-07-06 ENCOUNTER — Ambulatory Visit (HOSPITAL_COMMUNITY)
Admission: RE | Admit: 2023-07-06 | Discharge: 2023-07-06 | Disposition: A | Discharge status: Home / Self Care | Payer: 59 | Source: Ambulatory Visit | Attending: Internal Medicine | Admitting: Internal Medicine

## 2023-07-06 DIAGNOSIS — C719 Malignant neoplasm of brain, unspecified: Secondary | ICD-10-CM | POA: Diagnosis present

## 2023-07-06 DIAGNOSIS — I6789 Other cerebrovascular disease: Secondary | ICD-10-CM | POA: Insufficient documentation

## 2023-07-06 DIAGNOSIS — I672 Cerebral atherosclerosis: Secondary | ICD-10-CM | POA: Insufficient documentation

## 2023-07-06 DIAGNOSIS — Y842 Radiological procedure and radiotherapy as the cause of abnormal reaction of the patient, or of later complication, without mention of misadventure at the time of the procedure: Secondary | ICD-10-CM | POA: Diagnosis not present

## 2023-07-06 DIAGNOSIS — R4701 Aphasia: Secondary | ICD-10-CM | POA: Insufficient documentation

## 2023-07-06 MED ORDER — GADOBUTROL 1 MMOL/ML IV SOLN
7.0000 mL | Freq: Once | INTRAVENOUS | Status: AC | PRN
  Administered 2023-07-06: 7 mL via INTRAVENOUS

## 2023-07-08 ENCOUNTER — Ambulatory Visit: Payer: 59 | Admitting: Speech Pathology

## 2023-07-08 ENCOUNTER — Other Ambulatory Visit: Payer: Self-pay | Admitting: *Deleted

## 2023-07-08 DIAGNOSIS — C719 Malignant neoplasm of brain, unspecified: Secondary | ICD-10-CM

## 2023-07-08 DIAGNOSIS — R41841 Cognitive communication deficit: Secondary | ICD-10-CM

## 2023-07-08 DIAGNOSIS — R4701 Aphasia: Secondary | ICD-10-CM

## 2023-07-08 NOTE — Therapy (Signed)
OUTPATIENT SPEECH LANGUAGE PATHOLOGY  TREATMENT NOTE  Patient Name: Alan Wise MRN: [id number] DOB:28-Sep-1970, 53 y.o., male Today's Date: 07/08/2023  PCP: Nicki Reaper, NP REFERRING PROVIDER: Elissa Hefty, MD   End of Session - 07/08/23 1449     Visit Number 2    Number of Visits 9    Date for SLP Re-Evaluation 08/27/23    Authorization Type [employer]    Progress Note Due on Visit 10    SLP Start Time 1100    SLP Stop Time  1145    SLP Time Calculation (min) 45 min    Activity Tolerance Patient tolerated treatment well             Past Medical History:  Diagnosis Date   Allergy    Complication of anesthesia    pt reports he is starting to have more difficulty arousing after surgery   Diabetes mellitus (HCC)    GERD (gastroesophageal reflux disease)    Headache    History of kidney stones    Hyperlipidemia    Renal disorder    kidney stones   Past Surgical History:  Procedure Laterality Date   APPLICATION OF CRANIAL NAVIGATION Right 01/17/2021   Procedure: APPLICATION OF CRANIAL NAVIGATION;  Surgeon: Bedelia Person, MD;  Location[facility] OR;  Service: Neurosurgery;  Laterality: Right;   COLONOSCOPY  09/03/1994   COLONOSCOPY WITH PROPOFOL N/A 01/07/2023   Procedure: COLONOSCOPY WITH PROPOFOL;  Surgeon: [name], MD;  Location: [facility] ENDOSCOPY;  Service: Endoscopy;  Laterality: N/A;  NOT TOO EARLY   CYSTOSCOPY WITH STENT PLACEMENT Right 01/25/2016   Procedure: CYSTOSCOPY WITH STENT PLACEMENT;  Surgeon: Hildred Laser, MD;  Location: [facility] ORS;  Service: Urology;  Laterality: Right;   FRAMELESS  BIOPSY WITH BRAINLAB Right 01/17/2021   Procedure: RIGHT STEREOTACTIC BIOPSY OF INSULAR LESION;  Surgeon: Bedelia Person, MD;  Location: [facility] OR;  Service: Neurosurgery;  Laterality: Right;   KNEE ARTHROSCOPY W/ MENISCAL REPAIR Right 06/23/1988   POLYPECTOMY  01/07/2023   Procedure: POLYPECTOMY;  Surgeon: [name], MD;  Location: [facility]  ENDOSCOPY;  Service: Endoscopy;;   removal of birthmark  06/08/2013   SKIN CANCER EXCISION  06/23/2012   TYMPANOSTOMY TUBE PLACEMENT  08/06/1978   URETEROSCOPY WITH HOLMIUM LASER LITHOTRIPSY Right 01/25/2016   Procedure: URETEROSCOPY WITH HOLMIUM LASER LITHOTRIPSY;  Surgeon: Hildred Laser, MD;  Location: [facility] ORS;  Service: Urology;  Laterality: Right;   URETHRAL STRICTURE DILATATION     visual inspection of vocal cord     1973   WISDOM TOOTH EXTRACTION     Patient Active Problem List   Diagnosis Date Noted   Status epilepticus (HCC) 05/14/2023   DM2 (diabetes mellitus, type 2) (HCC) 05/14/2023   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 The below is his oncology history with pt's wife reports language impairment following right frontal biopsy on 12/03/2022. Pt with history of seizures and is currently  undergoing avastin infusions for help managing radiation necrosis.   01/17/2021 Surgery    Stereotactic biopsy with Dr. Maisie Fus; path demonstrates IDH-1 mutant oligodendroglioma    03/20/2021 - 04/30/2021 Radiation Therapy    Completes 54 Gy IMRT and Temodar with Dr. Basilio Cairo    06/02/2021 - 03/05/2022 Chemotherapy    Completes 8 cycles of adjuvant 5-day Temozolomide    02/01/2022 Progression    Progression of disease in multifocal/metastatic/lmd pattern    03/11/2022  Progression    Progression of disease confirmed after additional cycle of TMZ, 1 month follow up MRI.  LP/cytology and MRI spine mets screening unremarkable    04/10/2022 Surgery    Stereotactic biopsy of progressive R frontal lesion at [facility] with Dr. Zachery Conch.  Path demonstrates radiation necrosis    12/03/2022 Surgery    Repeat biopsy with Dr. Zachery Conch, R frontal.  Path again demonstrates necrosis and treatment effect    03/12/2023 -  Chemotherapy    Patient is on Treatment Plan : BRAIN GBM Bevacizumab 14d x 6 cycles     05/05/2023 IMPRESSION: 1. Considerable reduction in abnormal T2 and FLAIR signal/ mass effect in the anterior aspect of the right cerebral hemisphere compared to the prior study. Marked reduction in abnormal enhancement on the right. These findings suggest that much of what we were seeing previously was treatment effect. No evidence of new or worsening finding. 2. Old small vessel ischemic changes within the pons. The previously seen abnormality in the left pons represented a small vessel stroke. No sign of tumor in the pons or cerebellum.   PAIN:  Are you having pain? No   FALLS: Has patient fallen in last 6 months?  No  LIVING ENVIRONMENT: Lives with: lives with their family Lives in: House/apartment  PLOF:  Level of assistance: Needed assistance with ADLs, Needed assistance with IADLS Employment: Other: was working full-time but unable to do so at this time d/t cancer   SUBJECTIVE STATEMENT: Pt continues with severe word finding and communication deficits Pt accompanied by: significant other  PATIENT GOALS   to improve verbal communication  OBJECTIVE:    TODAY'S TREATMENT:  Skilled treatment session focused on pt's cognitive communication impairment. SLP facilitated session by providing the following interventions:  SLP introduced TalkPath Therapy App specifically Sentence Scramble to promote improved grammatical ability with verbal and written  communication. Pt with some initial confusion requiring Min A support to complete tasks and read created sentence.   Pt's wife reports that pt's ability to create grammatically correct emails has decline with decreased pt awareness of errors. She is currently assisting him with all aspects of his work. While they both report her eagerness to help, pt continues with decreased insight/awareness of deficits in writing, therefore they report that he responds prior to his wife's help. This writer recommends that pt limit working on computer until his wife is present increase his accuracy with responses. Pt voices reluctant agreement with this plan.   PATIENT REPORTED OUTCOME MEASURES (PROM): To be completed over the course of next 3 sessions   PATIENT EDUCATION: Education details: cognitive communication/language impairment Person educated: Patient and Spouse Education method: Explanation Education comprehension: needs further education   HOME EXERCISE PROGRAM:   Complete work related tasks with his wife present  TalkPath Therapy App    GOALS:  Goals reviewed with patient? Yes  SHORT TERM GOALS: Target date: 10 sessions   With Min A, Patient will answer abstract yes/no questions (e.g. "are there 20 hours in one day?")  with 80% in order to increase ability to communicate with family members accurately.  Baseline: Goal status: INITIAL  2.  With Min A, patient will name body parts at 75 % accuracy to increase ability to communicate with family members accurately.  Baseline:  Goal status: INITIAL   LONG TERM GOALS: Target date: 08/27/2023  With Min A, patient will demonstrate knowledge of appropriate activities to support cognitive and  language function outside of ST with assistance from family.  Baseline:  Goal status: INITIAL  2.  With Min A, patient/family will demonstrate understanding of the following concepts: aphasia, communication vs conversation, strengths/strategies to  promote success, local resources by answering multiple choice questions with 80% accuracy when provided supported conversation in order to increase patient's participation in medical care.  Baseline:  Goal status: INITIAL   ASSESSMENT:  CLINICAL IMPRESSION: Patient is a 53 y.o. left handed male who was seen today for a cognitive communication treatment d/t report of aphasia in the setting of right frontal oligodendroglioma.   Pt continues to present with severe expressive communication deficits d/t word finding deficits as well as deficits observed in receptive language, awareness of deficits, executive functions.   OBJECTIVE IMPAIRMENTS include attention, memory, awareness, executive functioning, expressive language, and receptive language. These impairments are limiting patient from return to work, managing medications, managing appointments, managing finances, household responsibilities, ADLs/IADLs, and effectively communicating at home and in community. Factors affecting potential to achieve goals and functional outcome are ability to learn/carryover information, co-morbidities, medical prognosis, and severity of impairments. Patient will benefit from skilled SLP services to address above impairments and improve overall function.  REHAB POTENTIAL: Fair prognosis, severity of impairments, time post onset  PLAN: SLP FREQUENCY: 1-2x/week  SLP DURATION: 8 weeks  PLANNED INTERVENTIONS: Language facilitation, Cueing hierachy, Cognitive reorganization, Internal/external aids, Functional tasks, Multimodal communication approach, SLP instruction and feedback, Compensatory strategies, Patient/family education, and 19147 Treatment of speech (30 or 45 min)   Lesieli Bresee B. Dreama Saa, M.S., CCC-SLP, Tree surgeon Certified Brain Injury Specialist [facility]  [facility] Rehabilitation Services Office [phone] Ascom [phone] Fax [phone]

## 2023-07-09 ENCOUNTER — Inpatient Hospital Stay: Payer: 59 | Attending: Internal Medicine

## 2023-07-09 ENCOUNTER — Inpatient Hospital Stay: Payer: 59

## 2023-07-09 ENCOUNTER — Inpatient Hospital Stay: Payer: 59 | Admitting: Licensed Clinical Social Worker

## 2023-07-09 ENCOUNTER — Inpatient Hospital Stay: Payer: 59 | Admitting: Internal Medicine

## 2023-07-09 VITALS — BP 172/92 | HR 58 | Temp 97.9°F | Resp 16 | Ht 68.0 in | Wt 148.6 lb

## 2023-07-09 DIAGNOSIS — Z79899 Other long term (current) drug therapy: Secondary | ICD-10-CM | POA: Diagnosis not present

## 2023-07-09 DIAGNOSIS — C719 Malignant neoplasm of brain, unspecified: Secondary | ICD-10-CM

## 2023-07-09 DIAGNOSIS — C711 Malignant neoplasm of frontal lobe: Secondary | ICD-10-CM | POA: Insufficient documentation

## 2023-07-09 DIAGNOSIS — Z87442 Personal history of urinary calculi: Secondary | ICD-10-CM | POA: Insufficient documentation

## 2023-07-09 DIAGNOSIS — Z9221 Personal history of antineoplastic chemotherapy: Secondary | ICD-10-CM | POA: Insufficient documentation

## 2023-07-09 DIAGNOSIS — E119 Type 2 diabetes mellitus without complications: Secondary | ICD-10-CM | POA: Diagnosis not present

## 2023-07-09 DIAGNOSIS — Y842 Radiological procedure and radiotherapy as the cause of abnormal reaction of the patient, or of later complication, without mention of misadventure at the time of the procedure: Secondary | ICD-10-CM

## 2023-07-09 DIAGNOSIS — E785 Hyperlipidemia, unspecified: Secondary | ICD-10-CM | POA: Diagnosis not present

## 2023-07-09 DIAGNOSIS — Z5111 Encounter for antineoplastic chemotherapy: Secondary | ICD-10-CM | POA: Diagnosis present

## 2023-07-09 DIAGNOSIS — I6789 Other cerebrovascular disease: Secondary | ICD-10-CM

## 2023-07-09 DIAGNOSIS — Z923 Personal history of irradiation: Secondary | ICD-10-CM | POA: Diagnosis not present

## 2023-07-09 DIAGNOSIS — K219 Gastro-esophageal reflux disease without esophagitis: Secondary | ICD-10-CM | POA: Insufficient documentation

## 2023-07-09 LAB — CBC WITH DIFFERENTIAL (CANCER CENTER ONLY)
Abs Immature Granulocytes: 0.03 10*3/uL (ref 0.00–0.07)
Basophils Absolute: 0.1 10*3/uL (ref 0.0–0.1)
Basophils Relative: 1 %
Eosinophils Absolute: 0.6 10*3/uL — ABNORMAL HIGH (ref 0.0–0.5)
Eosinophils Relative: 6 %
HCT: 48.1 % (ref 39.0–52.0)
Hemoglobin: 16.7 g/dL (ref 13.0–17.0)
Immature Granulocytes: 0 %
Lymphocytes Relative: 16 %
Lymphs Abs: 1.4 10*3/uL (ref 0.7–4.0)
MCH: 29 pg (ref 26.0–34.0)
MCHC: 34.7 g/dL (ref 30.0–36.0)
MCV: 83.5 fL (ref 80.0–100.0)
Monocytes Absolute: 0.5 10*3/uL (ref 0.1–1.0)
Monocytes Relative: 5 %
Neutro Abs: 6.6 10*3/uL (ref 1.7–7.7)
Neutrophils Relative %: 72 %
Platelet Count: 209 10*3/uL (ref 150–400)
RBC: 5.76 MIL/uL (ref 4.22–5.81)
RDW: 12.4 % (ref 11.5–15.5)
WBC Count: 9.1 10*3/uL (ref 4.0–10.5)
nRBC: 0 % (ref 0.0–0.2)

## 2023-07-09 LAB — CORTISOL: Cortisol, Plasma: 11.5 ug/dL

## 2023-07-09 LAB — VITAMIN D 25 HYDROXY (VIT D DEFICIENCY, FRACTURES): Vit D, 25-Hydroxy: 12.37 ng/mL — ABNORMAL LOW (ref 30–100)

## 2023-07-09 LAB — TSH: TSH: 2.169 u[IU]/mL (ref 0.350–4.500)

## 2023-07-09 LAB — SEDIMENTATION RATE: Sed Rate: 0 mm/h (ref 0–16)

## 2023-07-09 LAB — TOTAL PROTEIN, URINE DIPSTICK: Protein, ur: 100 mg/dL — AB

## 2023-07-09 LAB — VITAMIN B12: Vitamin B-12: 241 pg/mL (ref 180–914)

## 2023-07-09 MED ORDER — AMLODIPINE BESYLATE 5 MG PO TABS: 5.0000 mg | ORAL_TABLET | Freq: Every day | ORAL | 2 refills | Status: DC

## 2023-07-09 MED ORDER — SODIUM CHLORIDE 0.9 % IV SOLN: Freq: Once | INTRAVENOUS | Status: AC

## 2023-07-09 MED ORDER — SODIUM CHLORIDE 0.9 % IV SOLN
10.0000 mg/kg | Freq: Once | INTRAVENOUS | Status: AC
  Administered 2023-07-09: 700 mg via INTRAVENOUS
  Filled 2023-07-09: qty 16

## 2023-07-09 NOTE — Progress Notes (Signed)
[facility] [address]  [place], [place] [address] [phone]  Interval Evaluation  Date of Service: 07/09/23 Patient Name: Alan Wise Patient MRN: [id number] Patient DOB: Mar 14, 1971 Provider: Henreitta Leber, MD  Identifying Statement:  Alan Wise is a 53 y.o. 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 Maisie Fus; path demonstrates IDH-1 mutant oligodendroglioma   03/20/2021 - 04/30/2021 Radiation Therapy   Completes 54 Gy IMRT and Temodar with Dr. Basilio Cairo   06/02/2021 - 03/05/2022 Chemotherapy   Completes 8 cycles of adjuvant 5-day Temozolomide   02/01/2022 Progression   Progression of disease in multifocal/metastatic/lmd pattern   03/11/2022 Progression   Progression of disease confirmed after additional cycle of TMZ, 1 month follow up MRI.  LP/cytology and MRI spine mets screening unremarkable   04/10/2022 Surgery   Stereotactic biopsy of progressive R frontal lesion at [facility] with Dr. Zachery Conch.  Path demonstrates radiation necrosis   12/03/2022 Surgery   Repeat biopsy with Dr. Zachery Conch, R frontal.  Path again demonstrates necrosis and treatment effect   03/12/2023 -  Chemotherapy   Patient is on Treatment Plan : BRAIN GBM Bevacizumab 14d x 6 cycles       Biomarkers:   Interval History: Alan Wise presents today for cycle #5 of avastin.  He did have one stereotypical focal seizure at home after forgetting Lamictal the night before.  Cognition continues decline, even compared to last month.  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  Wife now has to supervise his emails to correct frequent errors.  Headaches are improved since starting propanolol   H+P (12/31/20) Patient presented to medical attention this past month, with complaint of involuntary left sided shaking.  He describes  episodes, occurring several times per week, of left arm shaking, lasting up to 5 minutes; following the episodes he describes the arm as "heavy" for some time before returning to normal.  He also describes episodes of sudden inability to speak, with strange movements of face and hands, lasting same amount of time.  These episodes go back ~10 years in very sporadic nature, but have become much more frequent in recent months.  He works full time as a Agricultural consultant at [employer].  These episodes have occurred at work, and have been disruptive in that environment.  He is otherwise fully functional, independent.  Medications: Current Outpatient Medications on File Prior to Visit  Medication Sig Dispense Refill   diazePAM, 15 MG Dose, (VALTOCO 15 MG DOSE) 2 x 7.5 MG/0.1ML LQPK Place 15 mg into the nose once as needed for up to 1 dose (seizure). 2 each 0   lamoTRIgine (LAMICTAL) 100 MG tablet Take 1 tablet (100 mg total) by mouth 2 (two) times daily. 60 tablet 2   propranolol (INDERAL) 20 MG tablet Take 1 tablet (20 mg total) by mouth 2 (two) times daily. 60 tablet 1   No current facility-administered medications on file prior to visit.    Allergies:  Allergies  Allergen Reactions   Contrast Media [Iodinated Contrast Media] Swelling   Past Medical History:  Past Medical History:  Diagnosis Date   Allergy    Complication of anesthesia    pt reports he is starting to have more difficulty arousing after surgery   Diabetes mellitus (HCC)    GERD (gastroesophageal reflux disease)    Headache  History of kidney stones    Hyperlipidemia    Renal disorder    kidney stones   Past Surgical History:  Past Surgical History:  Procedure Laterality Date   APPLICATION OF CRANIAL NAVIGATION Right 01/17/2021   Procedure: APPLICATION OF CRANIAL NAVIGATION;  Surgeon: Bedelia Person, MD;  Location: [facility] OR;  Service: Neurosurgery;  Laterality: Right;   COLONOSCOPY  09/03/1994   COLONOSCOPY WITH  PROPOFOL N/A 01/07/2023   Procedure: COLONOSCOPY WITH PROPOFOL;  Surgeon: [name], MD;  Location: [facility] ENDOSCOPY;  Service: Endoscopy;  Laterality: N/A;  NOT TOO EARLY   CYSTOSCOPY WITH STENT PLACEMENT Right 01/25/2016   Procedure: CYSTOSCOPY WITH STENT PLACEMENT;  Surgeon: Hildred Laser, MD;  Location: [facility] ORS;  Service: Urology;  Laterality: Right;   FRAMELESS  BIOPSY WITH BRAINLAB Right 01/17/2021   Procedure: RIGHT STEREOTACTIC BIOPSY OF INSULAR LESION;  Surgeon: Bedelia Person, MD;  Location: [facility] OR;  Service: Neurosurgery;  Laterality: Right;   KNEE ARTHROSCOPY W/ MENISCAL REPAIR Right 06/23/1988   POLYPECTOMY  01/07/2023   Procedure: POLYPECTOMY;  Surgeon: [name], MD;  Location: [facility] ENDOSCOPY;  Service: Endoscopy;;   removal of birthmark  06/08/2013   SKIN CANCER EXCISION  06/23/2012   TYMPANOSTOMY TUBE PLACEMENT  08/06/1978   URETEROSCOPY WITH HOLMIUM LASER LITHOTRIPSY Right 01/25/2016   Procedure: URETEROSCOPY WITH HOLMIUM LASER LITHOTRIPSY;  Surgeon: Hildred Laser, MD;  Location: [facility] ORS;  Service: Urology;  Laterality: Right;   URETHRAL STRICTURE DILATATION     visual inspection of vocal cord     1973   WISDOM TOOTH EXTRACTION     Social History:  Social History   Socioeconomic History   Marital status: Married    Spouse name: Not on file   Number of children: Not on file   Years of education: Not on file   Highest education level: Not on file  Occupational History   Not on file  Tobacco Use   Smoking status: Never   Smokeless tobacco: Never  Vaping Use   Vaping status: Never Used  Substance and Sexual Activity   Alcohol use: Not Currently    Comment: rare   Drug use: No   Sexual activity: Not on file  Other Topics Concern   Not on file  Social History Narrative   Not on file   Social Drivers of Health   Financial Resource Strain: Low Risk  (12/05/2022)   [facility] from [facility]    Overall Financial Resource Strain (CARDIA)    Difficulty of Paying Living Expenses: Not hard at all  Food Insecurity: No Food Insecurity (05/26/2023)   Hunger Vital Sign    Worried About Running Out of Food in the Last Year: Never true    Ran Out of Food in the Last Year: Never true  Transportation Needs: No Transportation Needs (05/26/2023)   PRAPARE - Administrator, Civil Service (Medical): No    Lack of Transportation (Non-Medical): No  Physical Activity: Not on file  Stress: Not on file  Social Connections: Not on file  Intimate Partner Violence: Not At Risk (05/26/2023)   Humiliation, Afraid, Rape, and Kick questionnaire    Fear of Current or Ex-Partner: No    Emotionally Abused: No    Physically Abused: No    Sexually Abused: No   Family History:  Family History  Problem Relation Age of Onset   Hyperlipidemia Father    Hypertension Father    Diabetes  Father    Benign prostatic hyperplasia Father    Stroke Maternal Grandmother    Diabetes Maternal Grandmother    Stroke Paternal Grandmother    Hypertension Paternal Grandmother    Kidney disease Neg Hx    Prostate cancer Neg Hx     Review of Systems: Constitutional: Doesn't report fevers, chills or abnormal weight loss Eyes: Doesn't report blurriness of vision Ears, nose, mouth, throat, and face: Doesn't report sore throat Respiratory: Doesn't report cough, dyspnea or wheezes Cardiovascular: Doesn't report palpitation, chest discomfort  Gastrointestinal:  Doesn't report nausea, constipation, diarrhea GU: Doesn't report incontinence Skin: Doesn't report skin rashes Neurological: Per HPI Musculoskeletal: Doesn't report joint pain Behavioral/Psych: Doesn't report anxiety  Physical Exam: Vitals:   07/09/23 1006  BP: (!) 172/92  Pulse: (!) 58  Resp: 16  Temp: 97.9 F (36.6 C)  SpO2: 99%   KPS: 70. General: Fatigued Head: Normal EENT: No conjunctival injection or scleral icterus.  Lungs: Resp effort  normal Cardiac: Regular rate Abdomen: Non-distended abdomen Skin: No rashes cyanosis or petechiae. Extremities: No clubbing or edema  Neurologic Exam: Mental Status: Awake, alert, attentive to examiner. Oriented to self and environment.  Language is notable for impaired fluency, with intact comprehension.  Modest psychomotor slowing, impaired 3 object recall Cranial Nerves: Visual acuity is grossly normal. Visual fields are full. Extra-ocular movements intact. No ptosis. Face is symmetric Motor: Tone and bulk are normal. Power is full in both arms and legs. Reflexes are symmetric, no pathologic reflexes present.  Sensory: Intact to light touch Gait: Normal.   Labs: I have reviewed the data as listed    Component Value Date/Time   NA 137 05/22/2023 0936   NA 138 05/04/2012 1815   K 4.2 05/22/2023 0936   K 3.5 05/04/2012 1815   CL 101 05/22/2023 0936   CL 99 05/04/2012 1815   CO2 30 05/22/2023 0936   CO2 29 05/04/2012 1815   GLUCOSE 161 (H) 05/22/2023 0936   GLUCOSE 69 05/04/2012 1815   BUN 14 05/22/2023 0936   BUN 12 05/04/2012 1815   CREATININE 0.88 05/22/2023 0936   CREATININE 0.90 11/14/2022 1025   CALCIUM 9.3 05/22/2023 0936   CALCIUM 9.2 05/04/2012 1815   PROT 6.7 05/22/2023 0936   PROT 8.5 (H) 05/04/2012 1815   ALBUMIN 4.3 05/22/2023 0936   ALBUMIN 4.5 05/04/2012 1815   AST 15 05/22/2023 0936   ALT 16 05/22/2023 0936   ALT 77 05/04/2012 1815   ALKPHOS 95 05/22/2023 0936   ALKPHOS 129 05/04/2012 1815   BILITOT 0.5 05/22/2023 0936   GFRNONAA >60 05/22/2023 0936   GFRNONAA 106 12/05/2020 1014   GFRAA 123 12/05/2020 1014   Lab Results  Component Value Date   WBC 9.1 07/09/2023   NEUTROABS 6.6 07/09/2023   HGB 16.7 07/09/2023   HCT 48.1 07/09/2023   MCV 83.5 07/09/2023   PLT 209 07/09/2023     Pathology report from June 2024 biopsy: A-B. Brain tissue, right frontal lesion, biopsy:   Brain tissue with changes consistent with treatment effect; see  comment.       Comment: There is no definitive morphologic, immunophenotypic, or molecular evidence of the patient's known oligodendroglioma, IDH-mutant and 1p/19q-codeleted; see comment. The specimen is negative for mutations in the TERT promoter and in IDH1/2 by Sanger sequencing.       Reviewed by resident/fellow Bernadette Hoit, GREGORY  Electronically signed by Ulanda Edison, MD on 12/23/2022     Assessment/Plan Oligodendroglioma [facility]) -  Plan: Testosterone, TSH, Vitamin B12, VITAMIN D 25 Hydroxy (Vit-D Deficiency, Fractures), C-reactive protein, Cortisol, Sedimentation rate, Infusion Appointment Request, Clinic Appointment Request, Lab Appointment Request, CBC with Differential (Cancer Center Only), Total Protein, Urine dipstick, Infusion Appointment Request, Clinic Appointment Request, Lab Appointment Request, CBC with Differential (Cancer Center Only), Total Protein, Urine dipstick  Radiation therapy induced brain necrosis - Plan: Infusion Appointment Request, Clinic Appointment Request, Lab Appointment Request, CBC with Differential (Cancer Center Only), Total Protein, Urine dipstick, Infusion Appointment Request, Clinic Appointment Request, Lab Appointment Request, CBC with Differential (Cancer Center Only), Total Protein, Urine dipstick   Alan Wise presents today for cycle #5 of avastin 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  Brain MRI demonstrates stable findings, improved burden of enhancement and T2/FLAIR signal abnormality.  Seizures are well controlled thus far on Lacmictal 100/mg BID, one breakthrough event was secondary to med compliance issue.  Cognitive and language impairments are again noted today.  He is really struggling to function at work.  These symptoms are likely secondary to delayed effects of radiation and toxicity of treatment.  It would be reasonable to check serum markers for reversible etiologies; TSH, cortisol, B12, Vit D,  testosterone, ESR/CRP.  He is agreeable with this.  Recommended continuing with cycle #5 avastin 10mg /kg IV q4 weeks.  We reviewed side effects including hypertension, wound healing impairment.  Urine protein will be checked every other cycle in addition to CBC.  Informed consent was obtained verbally at bedside to proceed with antibody therapy.  Avastin should be held for the following:  ANC less than 500  Platelets less than 50,000  LFT or creatinine greater than 2x ULN  If clinical concerns/contraindications develop  Con't Propanolol 20mg  BID which may help with headache prevention as well as blood pressure.  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  Will continue Lamictal 100mg  BID.  We ask that Alan Wise return to clinic in 4 weeks following for cycle #6 avastin, or sooner if needed.  All questions were answered. The patient knows to call the clinic with any problems, questions or concerns. No barriers to learning were detected.  The total time spent in the encounter was 40 minutes and more than 50% was on counseling and review of test results   Henreitta Leber, MD Medical Director of Neuro-Oncology [facility] [place] 07/09/23 11:50 AM

## 2023-07-09 NOTE — Progress Notes (Signed)
[facility] CSW Progress Note  Visual merchandiser met with patient and spouse in infusion to sign referral for the [employer].  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  Pt's wife is not working at this time.  CSW to remain available as appropriate to provide support throughout duration of treatment.      Rachel Moulds, LCSW Clinical Social Worker [facility]

## 2023-07-10 ENCOUNTER — Encounter: Payer: Self-pay | Admitting: Internal Medicine

## 2023-07-10 ENCOUNTER — Telehealth: Payer: Self-pay | Admitting: Internal Medicine

## 2023-07-10 ENCOUNTER — Other Ambulatory Visit: Payer: Self-pay

## 2023-07-10 DIAGNOSIS — C719 Malignant neoplasm of brain, unspecified: Secondary | ICD-10-CM

## 2023-07-10 DIAGNOSIS — Y842 Radiological procedure and radiotherapy as the cause of abnormal reaction of the patient, or of later complication, without mention of misadventure at the time of the procedure: Secondary | ICD-10-CM

## 2023-07-10 LAB — TESTOSTERONE: Testosterone: 311 ng/dL (ref 264–916)

## 2023-07-10 NOTE — Telephone Encounter (Signed)
 Marland Kitchen

## 2023-07-13 ENCOUNTER — Ambulatory Visit: Payer: 59 | Admitting: Speech Pathology

## 2023-07-13 DIAGNOSIS — R41841 Cognitive communication deficit: Secondary | ICD-10-CM | POA: Diagnosis not present

## 2023-07-13 DIAGNOSIS — C719 Malignant neoplasm of brain, unspecified: Secondary | ICD-10-CM

## 2023-07-13 NOTE — Therapy (Signed)
OUTPATIENT SPEECH LANGUAGE PATHOLOGY  TREATMENT NOTE  Patient Name: Alan Wise MRN: [id number] DOB:July 04, 1970, 53 y.o., male Today's Date: 07/13/2023  PCP: Nicki Reaper, NP REFERRING PROVIDER: Elissa Hefty, MD   End of Session - 07/13/23 1145     Visit Number 3    Number of Visits 9    Date for SLP Re-Evaluation 08/27/23    Authorization Type [employer]    Progress Note Due on Visit 10    SLP Start Time 1145    SLP Stop Time  1240    SLP Time Calculation (min) 55 min    Activity Tolerance Patient tolerated treatment well             Past Medical History:  Diagnosis Date   Allergy    Complication of anesthesia    pt reports he is starting to have more difficulty arousing after surgery   Diabetes mellitus (HCC)    GERD (gastroesophageal reflux disease)    Headache    History of kidney stones    Hyperlipidemia    Renal disorder    kidney stones   Past Surgical History:  Procedure Laterality Date   APPLICATION OF CRANIAL NAVIGATION Right 01/17/2021   Procedure: APPLICATION OF CRANIAL NAVIGATION;  Surgeon: Bedelia Person, MD;  Location: [facility] OR;  Service: Neurosurgery;  Laterality: Right;   COLONOSCOPY  09/03/1994   COLONOSCOPY WITH PROPOFOL N/A 01/07/2023   Procedure: COLONOSCOPY WITH PROPOFOL;  Surgeon: [name], MD;  [facility] ENDOSCOPY;  Service: Endoscopy;  Laterality: N/A;  NOT TOO EARLY   CYSTOSCOPY WITH STENT PLACEMENT Right 01/25/2016   Procedure: CYSTOSCOPY WITH STENT PLACEMENT;  Surgeon: Hildred Laser, MD;  Location: [facility] ORS;  Service: Urology;  Laterality: Right;   FRAMELESS  BIOPSY WITH BRAINLAB Right 01/17/2021   Procedure: RIGHT STEREOTACTIC BIOPSY OF INSULAR LESION;  Surgeon: Bedelia Person, MD;  Location: [facility] OR;  Service: Neurosurgery;  Laterality: Right;   KNEE ARTHROSCOPY W/ MENISCAL REPAIR Right 06/23/1988   POLYPECTOMY  01/07/2023   Procedure: POLYPECTOMY;  Surgeon: [name], MD;  Location: [facility]  ENDOSCOPY;  Service: Endoscopy;;   removal of birthmark  06/08/2013   SKIN CANCER EXCISION  06/23/2012   TYMPANOSTOMY TUBE PLACEMENT  08/06/1978   URETEROSCOPY WITH HOLMIUM LASER LITHOTRIPSY Right 01/25/2016   Procedure: URETEROSCOPY WITH HOLMIUM LASER LITHOTRIPSY;  Surgeon: Hildred Laser, MD;  Location: [facility] ORS;  Service: Urology;  Laterality: Right;   URETHRAL STRICTURE DILATATION     visual inspection of vocal cord     1973   WISDOM TOOTH EXTRACTION     Patient Active Problem List   Diagnosis Date Noted   Status epilepticus (HCC) 05/14/2023   DM2 (diabetes mellitus, type 2) (HCC) 05/14/2023   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 The below is his oncology history with pt's wife reports language impairment following right frontal biopsy on 12/03/2022. 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   01/17/2021 Surgery    Stereotactic biopsy with Dr. Maisie Fus; path demonstrates IDH-1 mutant oligodendroglioma    03/20/2021 - 04/30/2021 Radiation Therapy    Completes 54 Gy IMRT and Temodar with Dr. Basilio Cairo    06/02/2021 - 03/05/2022 Chemotherapy    Completes 8 cycles of adjuvant 5-day Temozolomide    02/01/2022 Progression    Progression of disease in multifocal/metastatic/lmd pattern    03/11/2022 Progression     Progression of disease confirmed after additional cycle of TMZ, 1 month follow up MRI.  LP/cytology and MRI spine mets screening unremarkable    04/10/2022 Surgery    Stereotactic biopsy of progressive R frontal lesion at [facility] with Dr. Zachery Conch.  Path demonstrates radiation necrosis    12/03/2022 Surgery    Repeat biopsy with Dr. Zachery Conch, R frontal.  Path again demonstrates necrosis and treatment effect    03/12/2023 -  Chemotherapy    Patient is on Treatment Plan : BRAIN GBM Bevacizumab 14d x 6 cycles     05/05/2023 IMPRESSION: 1. Considerable reduction in abnormal T2 and FLAIR signal/ mass effect in the anterior aspect of the right cerebral hemisphere compared to the prior study. Marked reduction in abnormal enhancement on the right. These findings suggest that much of what we were seeing previously was treatment effect. No evidence of new or worsening finding. 2. Old small vessel ischemic changes within the pons. The previously seen abnormality in the left pons represented a small vessel stroke. No sign of tumor in the pons or cerebellum.   PAIN:  Are you having pain? No   FALLS: Has patient fallen in last 6 months?  No  LIVING ENVIRONMENT: Lives with: lives with their family Lives in: House/apartment  PLOF:  Level of assistance: Needed assistance with ADLs, Needed assistance with IADLS Employment: Other: was working full-time but unable to do so at this time d/t cancer   SUBJECTIVE STATEMENT: Pt continues with severe word finding, communication deficits, wife provides history of events of last week Pt accompanied by: significant other  PATIENT GOALS   to improve verbal communication  OBJECTIVE:    TODAY'S TREATMENT:  Skilled treatment session focused on pt's cognitive communication impairment. SLP facilitated session by providing the following interventions:  Pt unable to answer basic yes/no questions (biographical), unable to convey meaning verbally, his wife  brought in emails of progressive decline in responding to emails,   Pt with continued lack of awareness of deficits.   Given today's performance and information provided, the following message was sent to pt's oncologist via secure chat.   Hi Dr Barbaraann Cao:  By way of introduction, I am the Speech Pathologist who has been working with Mr Lafrance at [facility].   I wanted to provide an update on Mr Fergus as his wife and I have observed a rapid decline over the last 2-3 weeks.   He currently presents with a severe cognitive communication impairment impacting his ability to effectively/independently complete ADLs and iADLs. Objectively, he has deficits in expressive language (profound), receptive language (mild to moderate), memory impairment (severe) (short-term, auditory, visual, prospective), semi-complex problem solving (moderate) and metacognition (moderate).   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   Initially, his wife reported that he wasn't able to recall how to use a knife effectively, but over the last week, she reports that he has been unable to complete more tasks such as pumping gas, plugging in his cell phone. In  addition, during the last week, his responses to emails went from being agrammatical to currently containing nonsensically spelled words that no longer convey meaning. During today's session, he was not able to answer basic biographical yes/no questions (such as his age).   During today's session, it was brought to my attention that he has office hours tomorrow. Given the severity of his impairment and his performance in our session today, I am concerned about his ability to effectively manage his job responsibilities.  Given his anosognosia and determination to finish this semester, his wife and I share a concern that continuing in his role, under its current structure, could be detrimental and have a lasting impact on the reputation  that he has worked so hard to build.     PATIENT REPORTED OUTCOME MEASURES (PROM): To be completed over the course of next 3 sessions   PATIENT EDUCATION: Education details: cognitive communication/language impairment Person educated: Patient and Spouse Education method: Explanation Education comprehension: needs further education   HOME EXERCISE PROGRAM:   Complete work related tasks with his wife present  Talk[employer] Therapy App    GOALS:  Goals reviewed with patient? Yes  SHORT TERM GOALS: Target date: 10 sessions   With Min A, Patient will answer abstract yes/no questions (e.g. "are there 20 hours in one day?") with 80% in order to increase ability to communicate with family members accurately.  Baseline: Goal status: INITIAL  2.  With Min A, patient will name body parts at 75 % accuracy to increase ability to communicate with family members accurately.  Baseline:  Goal status: INITIAL   LONG TERM GOALS: Target date: 08/27/2023  With Min A, patient will demonstrate knowledge of appropriate activities to support cognitive and  language function outside of ST with assistance from family.  Baseline:  Goal status: INITIAL  2.  With Min A, patient/family will demonstrate understanding of the following concepts: aphasia, communication vs conversation, strengths/strategies to promote success, local resources by answering multiple choice questions with 80% accuracy when provided supported conversation in order to increase patient's participation in medical care.  Baseline:  Goal status: INITIAL   ASSESSMENT:  CLINICAL IMPRESSION: Patient is a 53 y.o. left handed male who was seen today for a cognitive communication treatment d/t report of aphasia in the setting of right frontal oligodendroglioma.   Given pt's performance during today's session, would recommend pt have 24 hour supervision, schedule a mock session with his TA to practice teaching potential concepts prior to  holding in-person student based office hours and also encourage other family members to attend ST sessions for education.   OBJECTIVE IMPAIRMENTS include attention, memory, awareness, executive functioning, expressive language, and receptive language. These impairments are limiting patient from return to work, managing medications, managing appointments, managing finances, household responsibilities, ADLs/IADLs, and effectively communicating at home and in community. Factors affecting potential to achieve goals and functional outcome are ability to learn/carryover information, co-morbidities, medical prognosis, and severity of impairments. Patient will benefit from skilled SLP services to address above impairments and improve overall function.  REHAB POTENTIAL: Fair prognosis, severity of impairments, time post onset  PLAN: SLP FREQUENCY: 1-2x/week  SLP DURATION: 8 weeks  PLANNED INTERVENTIONS: Language facilitation, Cueing hierachy, Cognitive reorganization, Internal/external aids, Functional tasks, Multimodal communication approach, SLP instruction and feedback, Compensatory strategies, Patient/family education, and 40981 Treatment of speech (30 or 45 min)   Solyana Nonaka B. Dreama Saa, M.S., CCC-SLP, CBIS Speech-Language Pathologist Certified Brain Injury Specialist [place]  [facility]  Office [phone] Ascom [phone] Fax [phone]

## 2023-07-15 ENCOUNTER — Ambulatory Visit: Payer: 59 | Admitting: Speech Pathology

## 2023-07-15 DIAGNOSIS — C719 Malignant neoplasm of brain, unspecified: Secondary | ICD-10-CM

## 2023-07-15 DIAGNOSIS — R41841 Cognitive communication deficit: Secondary | ICD-10-CM

## 2023-07-15 DIAGNOSIS — R4701 Aphasia: Secondary | ICD-10-CM

## 2023-07-15 NOTE — Therapy (Signed)
OUTPATIENT SPEECH LANGUAGE PATHOLOGY  TREATMENT NOTE  Patient Name: Alan Wise MRN: [id number] DOB:Jan 29, 1971, 53 y.o., male Today's Date: 07/15/2023  PCP: Nicki Reaper, NP REFERRING PROVIDER: Elissa Hefty, MD   End of Session - 07/15/23 0926     Visit Number 4    Number of Visits 9    Date for SLP Re-Evaluation 08/27/23    Authorization Type [employer]    Progress Note Due on Visit 10    SLP Start Time 0930    SLP Stop Time  1015    SLP Time Calculation (min) 45 min    Activity Tolerance Patient tolerated treatment well             Past Medical History:  Diagnosis Date   Allergy    Complication of anesthesia    pt reports he is starting to have more difficulty arousing after surgery   Diabetes mellitus (HCC)    GERD (gastroesophageal reflux disease)    Headache    History of kidney stones    Hyperlipidemia    Renal disorder    kidney stones   Past Surgical History:  Procedure Laterality Date   APPLICATION OF CRANIAL NAVIGATION Right 01/17/2021   Procedure: APPLICATION OF CRANIAL NAVIGATION;  Surgeon: Bedelia Person, MD;  Location: [facility] OR;  Service: Neurosurgery;  Laterality: Right;   COLONOSCOPY  09/03/1994   COLONOSCOPY WITH PROPOFOL N/A 01/07/2023   Procedure: COLONOSCOPY WITH PROPOFOL;  Surgeon: [name], MD;  Location: [facility] ENDOSCOPY;  Service: Endoscopy;  Laterality: N/A;  NOT TOO EARLY   CYSTOSCOPY WITH STENT PLACEMENT Right 01/25/2016   Procedure: CYSTOSCOPY WITH STENT PLACEMENT;  Surgeon: Hildred Laser, MD;  Location: [facility] ORS;  Service: Urology;  Laterality: Right;   FRAMELESS  BIOPSY WITH BRAINLAB Right 01/17/2021   Procedure: RIGHT STEREOTACTIC BIOPSY OF INSULAR LESION;  Surgeon: Bedelia Person, MD;  Location: [facility] OR;  Service: Neurosurgery;  Laterality: Right;   KNEE ARTHROSCOPY W/ MENISCAL REPAIR Right 06/23/1988   POLYPECTOMY  01/07/2023   Procedure: POLYPECTOMY;  Surgeon: [name], MD;  Location: [facility]  ENDOSCOPY;  Service: Endoscopy;;   removal of birthmark  06/08/2013   SKIN CANCER EXCISION  06/23/2012   TYMPANOSTOMY TUBE PLACEMENT  08/06/1978   URETEROSCOPY WITH HOLMIUM LASER LITHOTRIPSY Right 01/25/2016   Procedure: URETEROSCOPY WITH HOLMIUM LASER LITHOTRIPSY;  Surgeon: Hildred Laser, MD;  Location: [facility] ORS;  Service: Urology;  Laterality: Right;   URETHRAL STRICTURE DILATATION     visual inspection of vocal cord     1973   WISDOM TOOTH EXTRACTION     Patient Active Problem List   Diagnosis Date Noted   Status epilepticus (HCC) 05/14/2023   DM2 (diabetes mellitus, type 2) (HCC) 05/14/2023   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 The below is his oncology history with pt's wife reports language impairment following right frontal biopsy on 12/03/2022. Pt with history of seizures and is currently  undergoing avastin infusions for help managing radiation necrosis.   01/17/2021 Surgery    Stereotactic biopsy with Dr. Maisie Fus; path demonstrates IDH-1 mutant oligodendroglioma    03/20/2021 - 04/30/2021 Radiation Therapy    Completes 54 Gy IMRT and Temodar with Dr. Basilio Cairo    06/02/2021 - 03/05/2022 Chemotherapy    Completes 8 cycles of adjuvant 5-day Temozolomide    02/01/2022 Progression    Progression of disease in multifocal/metastatic/lmd pattern    03/11/2022  Progression    Progression of disease confirmed after additional cycle of TMZ, 1 month follow up MRI.  LP/cytology and MRI spine mets screening unremarkable    04/10/2022 Surgery    Stereotactic biopsy of progressive R frontal lesion at [facility] with Dr. Zachery Conch.  Path demonstrates radiation necrosis    12/03/2022 Surgery    Repeat biopsy with Dr. Zachery Conch, R frontal.  Path again demonstrates necrosis and treatment effect    03/12/2023 -  Chemotherapy    Patient is on Treatment Plan : BRAIN GBM Bevacizumab 14d x 6 cycles     05/05/2023 IMPRESSION: 1. Considerable reduction in abnormal T2 and FLAIR signal/ mass effect in the anterior aspect of the right cerebral hemisphere compared to the prior study. Marked reduction in abnormal enhancement on the right. These findings suggest that much of what we were seeing previously was treatment effect. No evidence of new or worsening finding. 2. Old small vessel ischemic changes within the pons. The previously seen abnormality in the left pons represented a small vessel stroke. No sign of tumor in the pons or cerebellum.   PAIN:  Are you having pain? No   FALLS: Has patient fallen in last 6 months?  No  LIVING ENVIRONMENT: Lives with: lives with their family Lives in: House/apartment  PLOF:  Level of assistance: Needed assistance with ADLs, Needed assistance with IADLS Employment: Other: was working full-time but unable to do so at this time d/t cancer   SUBJECTIVE STATEMENT: PT reports that his mother took him to work, took 3 hours or so to send 6-7 emails Pt accompanied by: significant other  PATIENT GOALS   to improve verbal communication  OBJECTIVE:    TODAY'S TREATMENT:  Skilled treatment session focused on pt's cognitive communication impairment. SLP facilitated session by providing the following interventions:  Pt and his wife arrive to session reporting that his mother took him to work yesterday and proofread emails with him.  Pt states that he didn't coordinate with his TA d/t conflict in times available. Wife states that pt has been resistance to her attempts to help him professionally therefore she has requested that his mother assist pt during office hours.   SLP facilitated a mock "office hours" tutorial by presenting basic algebra related equation questions - what is the coefficient, constant, variable, operator of sample problem 4x-7=5 - despite maximal multimodal assistance, pt unable to answer the above questions either verbally or using written language. Pt with poor task tolerance of awareness building questions.   Apart from the mock session, pt communicated that he needed to cancel next Tuesday's session but was not able to state where is needed to be - he spoke fluently using handed gestures but was not able to communicate that he would be at [facility] or convey a message about fact he had office hours next Tuesday that conflicted with ST session. Suspect pt's receptive language deficits are increasing.   Functionally he was not able to provide his name when checking in for appt.  PATIENT REPORTED OUTCOME MEASURES (PROM): To be completed over the course of next 3 sessions   PATIENT EDUCATION: Education details: cognitive communication/language impairment Person educated: Patient and Spouse Education method: Explanation Education comprehension: needs further education   HOME EXERCISE PROGRAM:   Complete work related tasks with his wife present     GOALS:  Goals reviewed with patient? Yes  SHORT TERM GOALS: Target date: 10 sessions   With Min A, Patient will answer abstract yes/no questions (e.g. "are there 20 hours in one day?") with 80% in order to increase ability to communicate with family members accurately.  Baseline: Goal status: INITIAL  2.  With Min A, patient will name body parts at 75 % accuracy to increase ability to communicate with family members accurately.  Baseline:  Goal  status: INITIAL   LONG TERM GOALS: Target date: 08/27/2023  With Min A, patient will demonstrate knowledge of appropriate activities to support cognitive and  language function outside of ST with assistance from family.  Baseline:  Goal status: INITIAL  2.  With Min A, patient/family will demonstrate understanding of the following concepts: aphasia, communication vs conversation, strengths/strategies to promote success, local resources by answering multiple choice questions with 80% accuracy when provided supported conversation in order to increase patient's participation in medical care.  Baseline:  Goal status: INITIAL   ASSESSMENT:  CLINICAL IMPRESSION: Patient is a 53 y.o. left handed male who was seen today for a cognitive communication treatment d/t report of aphasia in the setting of right frontal oligodendroglioma.   Pt's ability to receive and implement hierarchial cuing and therapuetic tasks has been limited d/t severity of deficits and is further complicated by his anosognosia of deficits. In addition, pt with poor task tolerance when re-directed to therapuetic tasks/questions. As such, continued skilled ST services is less likely to be impactful and recommend that pt continue to have support when engaging with work-related topics/emails/office hours, his wife seek a support group related to brain tumors as well as possible help with social workers either at [employer] as well as continue to have support in the home by friends and family. Will plan to continue x 1 session to complete any further education.   OBJECTIVE IMPAIRMENTS include attention, memory, awareness, executive functioning, expressive language, and receptive language. These impairments are limiting patient from return to work, managing medications, managing appointments, managing finances, household responsibilities, ADLs/IADLs, and effectively communicating at home and in community. Factors affecting  potential to achieve goals and functional outcome are ability to learn/carryover information, co-morbidities, medical prognosis, and severity of impairments. Patient will benefit from skilled SLP services to address above impairments and improve overall function.  REHAB POTENTIAL: Fair prognosis, severity of impairments, time post onset  PLAN: SLP FREQUENCY: 1-2x/week  SLP DURATION: 8 weeks  PLANNED INTERVENTIONS: Language facilitation, Cueing hierachy, Cognitive reorganization, Internal/external aids, Functional tasks, Multimodal communication approach, SLP instruction and feedback, Compensatory strategies, Patient/family education, and 59563 Treatment of speech (30 or 45 min)   Markese Bloxham B. Dreama Saa, M.S., CCC-SLP, Tree surgeon Certified Brain Injury Specialist [facility] [address]  [facility] Rehabilitation Services Office [phone] Ascom [phone] Fax [phone]

## 2023-07-21 ENCOUNTER — Ambulatory Visit: Payer: 59 | Admitting: Speech Pathology

## 2023-07-24 ENCOUNTER — Ambulatory Visit: Payer: 59 | Admitting: Speech Pathology

## 2023-07-28 ENCOUNTER — Ambulatory Visit: Payer: 59 | Admitting: Speech Pathology

## 2023-07-30 ENCOUNTER — Ambulatory Visit: Payer: 59 | Attending: Internal Medicine | Admitting: Speech Pathology

## 2023-07-30 ENCOUNTER — Telehealth: Payer: Self-pay | Admitting: Speech Pathology

## 2023-07-30 NOTE — Telephone Encounter (Signed)
 Pt was a no call/no show for today's appt. Spoke with pt's wife who states they overlooked appt. She recommends canceling future sessions since discharge was going to be discussed during today's session.   [name] B. Rubbie, M.S., CCC-SLP, Tree Surgeon Certified Brain Injury Specialist [facility]  [facility] Rehabilitation Services Office [phone] Ascom [phone] Fax [phone]

## 2023-08-04 ENCOUNTER — Ambulatory Visit: Payer: 59 | Admitting: Speech Pathology

## 2023-08-06 ENCOUNTER — Encounter: Payer: 59 | Admitting: Speech Pathology

## 2023-08-10 ENCOUNTER — Inpatient Hospital Stay: Payer: 59 | Admitting: Internal Medicine

## 2023-08-10 ENCOUNTER — Inpatient Hospital Stay: Payer: 59 | Attending: Internal Medicine

## 2023-08-10 ENCOUNTER — Inpatient Hospital Stay: Payer: 59

## 2023-08-10 VITALS — BP 149/80 | HR 62 | Resp 16

## 2023-08-10 VITALS — BP 149/89 | HR 61 | Temp 97.4°F | Resp 18 | Ht 68.0 in | Wt 150.4 lb

## 2023-08-10 DIAGNOSIS — Z79899 Other long term (current) drug therapy: Secondary | ICD-10-CM | POA: Diagnosis not present

## 2023-08-10 DIAGNOSIS — Z5111 Encounter for antineoplastic chemotherapy: Secondary | ICD-10-CM | POA: Insufficient documentation

## 2023-08-10 DIAGNOSIS — R569 Unspecified convulsions: Secondary | ICD-10-CM | POA: Insufficient documentation

## 2023-08-10 DIAGNOSIS — Z923 Personal history of irradiation: Secondary | ICD-10-CM | POA: Insufficient documentation

## 2023-08-10 DIAGNOSIS — E119 Type 2 diabetes mellitus without complications: Secondary | ICD-10-CM | POA: Diagnosis not present

## 2023-08-10 DIAGNOSIS — C719 Malignant neoplasm of brain, unspecified: Secondary | ICD-10-CM

## 2023-08-10 DIAGNOSIS — E785 Hyperlipidemia, unspecified: Secondary | ICD-10-CM | POA: Insufficient documentation

## 2023-08-10 DIAGNOSIS — Z87442 Personal history of urinary calculi: Secondary | ICD-10-CM | POA: Insufficient documentation

## 2023-08-10 DIAGNOSIS — R251 Tremor, unspecified: Secondary | ICD-10-CM | POA: Diagnosis not present

## 2023-08-10 DIAGNOSIS — C711 Malignant neoplasm of frontal lobe: Secondary | ICD-10-CM | POA: Insufficient documentation

## 2023-08-10 DIAGNOSIS — Z9221 Personal history of antineoplastic chemotherapy: Secondary | ICD-10-CM | POA: Diagnosis not present

## 2023-08-10 DIAGNOSIS — I6789 Other cerebrovascular disease: Secondary | ICD-10-CM | POA: Diagnosis not present

## 2023-08-10 DIAGNOSIS — K219 Gastro-esophageal reflux disease without esophagitis: Secondary | ICD-10-CM | POA: Insufficient documentation

## 2023-08-10 DIAGNOSIS — R519 Headache, unspecified: Secondary | ICD-10-CM | POA: Insufficient documentation

## 2023-08-10 DIAGNOSIS — Y842 Radiological procedure and radiotherapy as the cause of abnormal reaction of the patient, or of later complication, without mention of misadventure at the time of the procedure: Secondary | ICD-10-CM

## 2023-08-10 DIAGNOSIS — C710 Malignant neoplasm of cerebrum, except lobes and ventricles: Secondary | ICD-10-CM

## 2023-08-10 LAB — CBC WITH DIFFERENTIAL (CANCER CENTER ONLY)
Abs Immature Granulocytes: 0.02 10*3/uL (ref 0.00–0.07)
Basophils Absolute: 0.1 10*3/uL (ref 0.0–0.1)
Basophils Relative: 1 %
Eosinophils Absolute: 0.5 10*3/uL (ref 0.0–0.5)
Eosinophils Relative: 7 %
HCT: 44.3 % (ref 39.0–52.0)
Hemoglobin: 15.3 g/dL (ref 13.0–17.0)
Immature Granulocytes: 0 %
Lymphocytes Relative: 18 %
Lymphs Abs: 1.3 10*3/uL (ref 0.7–4.0)
MCH: 28.7 pg (ref 26.0–34.0)
MCHC: 34.5 g/dL (ref 30.0–36.0)
MCV: 83 fL (ref 80.0–100.0)
Monocytes Absolute: 0.5 10*3/uL (ref 0.1–1.0)
Monocytes Relative: 6 %
Neutro Abs: 4.8 10*3/uL (ref 1.7–7.7)
Neutrophils Relative %: 68 %
Platelet Count: 208 10*3/uL (ref 150–400)
RBC: 5.34 MIL/uL (ref 4.22–5.81)
RDW: 12.4 % (ref 11.5–15.5)
WBC Count: 7.2 10*3/uL (ref 4.0–10.5)
nRBC: 0 % (ref 0.0–0.2)

## 2023-08-10 LAB — C-REACTIVE PROTEIN: CRP: <0.5 mg/dL (ref <1.0)

## 2023-08-10 LAB — TOTAL PROTEIN, URINE DIPSTICK: Protein, ur: 30 mg/dL — AB

## 2023-08-10 MED ORDER — SODIUM CHLORIDE 0.9 % IV SOLN
10.0000 mg/kg | Freq: Once | INTRAVENOUS | Status: AC
  Administered 2023-08-10: 700 mg via INTRAVENOUS
  Filled 2023-08-10: qty 12

## 2023-08-10 MED ORDER — VITAMIN D 25 MCG (1000 UNIT) PO TABS: 1000.0000 [IU] | ORAL_TABLET | Freq: Every day | ORAL | 2 refills | Status: DC

## 2023-08-10 MED ORDER — SODIUM CHLORIDE 0.9 % IV SOLN
Freq: Once | INTRAVENOUS | Status: AC
Start: 2023-08-10 — End: 2023-08-10

## 2023-08-10 NOTE — Progress Notes (Signed)
[facility] [address]  [place], [place] [address] [phone]  Interval Evaluation  Date of Service: 08/10/23 Patient Name: Alan Wise Patient MRN: [id number] Patient DOB: Oct 09, 1970 Provider: Henreitta Leber, MD  Identifying Statement:  Alan Wise is a 53 y.o. 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 Maisie Fus; path demonstrates IDH-1 mutant oligodendroglioma   03/20/2021 - 04/30/2021 Radiation Therapy   Completes 54 Gy IMRT and Temodar with Dr. Basilio Cairo   06/02/2021 - 03/05/2022 Chemotherapy   Completes 8 cycles of adjuvant 5-day Temozolomide   02/01/2022 Progression   Progression of disease in multifocal/metastatic/lmd pattern   03/11/2022 Progression   Progression of disease confirmed after additional cycle of TMZ, 1 month follow up MRI.  LP/cytology and MRI spine mets screening unremarkable   04/10/2022 Surgery   Stereotactic biopsy of progressive R frontal lesion at [facility] with Dr. Zachery Conch.  Path demonstrates radiation necrosis   12/03/2022 Surgery   Repeat biopsy with Dr. Zachery Conch, R frontal.  Path again demonstrates necrosis and treatment effect   03/12/2023 -  Chemotherapy   Patient is on Treatment Plan : BRAIN GBM Bevacizumab 14d x 6 cycles       Biomarkers:   Interval History: Alan Wise presents today for cycle #7 of avastin.  Fortunately no further seizures since last infusion.  Cognition continues to decline, though less change compared to last month.  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  Wife still has to supervise his emails to correct frequent errors.  He plans to retire at the end of the semester. Headaches remain improved since starting propanolol   H+P (12/31/20) Patient presented to medical attention this past month, with complaint of involuntary left sided  shaking.  He describes episodes, occurring several times per week, of left arm shaking, lasting up to 5 minutes; following the episodes he describes the arm as "heavy" for some time before returning to normal.  He also describes episodes of sudden inability to speak, with strange movements of face and hands, lasting same amount of time.  These episodes go back ~10 years in very sporadic nature, but have become much more frequent in recent months.  He works full time as a Agricultural consultant at [employer].  These episodes have occurred at work, and have been disruptive in that environment.  He is otherwise fully functional, independent.  Medications: Current Outpatient Medications on File Prior to Visit  Medication Sig Dispense Refill   amLODipine (NORVASC) 5 MG tablet Take 1 tablet (5 mg total) by mouth daily. 30 tablet 2   diazePAM, 15 MG Dose, (VALTOCO 15 MG DOSE) 2 x 7.5 MG/0.1ML LQPK Place 15 mg into the nose once as needed for up to 1 dose (seizure). 2 each 0   lamoTRIgine (LAMICTAL) 100 MG tablet Take 1 tablet (100 mg total) by mouth 2 (two) times daily. 60 tablet 2   propranolol (INDERAL) 20 MG tablet Take 1 tablet (20 mg total) by mouth 2 (two) times daily. 60 tablet 1   No current facility-administered medications on file prior to visit.    Allergies:  Allergies  Allergen Reactions   Contrast Media [Iodinated Contrast Media] Swelling   Past Medical History:  Past Medical History:  Diagnosis Date   Allergy    Complication of anesthesia    pt reports he is starting to  have more difficulty arousing after surgery   Diabetes mellitus (HCC)    GERD (gastroesophageal reflux disease)    Headache    History of kidney stones    Hyperlipidemia    Renal disorder    kidney stones   Past Surgical History:  Past Surgical History:  Procedure Laterality Date   APPLICATION OF CRANIAL NAVIGATION Right 01/17/2021   Procedure: APPLICATION OF CRANIAL NAVIGATION;  Surgeon: Bedelia Person,  MD;  Location: [facility];  Service: Neurosurgery;  Laterality: Right;   COLONOSCOPY  09/03/1994   COLONOSCOPY WITH PROPOFOL N/A 01/07/2023   Procedure: COLONOSCOPY WITH PROPOFOL;  Surgeon: [name], MD;  Location: [facility] ENDOSCOPY;  Service: Endoscopy;  Laterality: N/A;  NOT TOO EARLY   CYSTOSCOPY WITH STENT PLACEMENT Right 01/25/2016   Procedure: CYSTOSCOPY WITH STENT PLACEMENT;  Surgeon: Hildred Laser, MD;  Location: [facility] ORS;  Service: Urology;  Laterality: Right;   FRAMELESS  BIOPSY WITH BRAINLAB Right 01/17/2021   Procedure: RIGHT STEREOTACTIC BIOPSY OF INSULAR LESION;  Surgeon: Bedelia Person, MD;  [facility];  Service: Neurosurgery;  Laterality: Right;   KNEE ARTHROSCOPY W/ MENISCAL REPAIR Right 06/23/1988   POLYPECTOMY  01/07/2023   Procedure: POLYPECTOMY;  Surgeon: [name], MD;  Location: [facility] ENDOSCOPY;  Service: Endoscopy;;   removal of birthmark  06/08/2013   SKIN CANCER EXCISION  06/23/2012   TYMPANOSTOMY TUBE PLACEMENT  08/06/1978   URETEROSCOPY WITH HOLMIUM LASER LITHOTRIPSY Right 01/25/2016   Procedure: URETEROSCOPY WITH HOLMIUM LASER LITHOTRIPSY;  Surgeon: Hildred Laser, MD;  Location: [facility] ORS;  Service: Urology;  Laterality: Right;   URETHRAL STRICTURE DILATATION     visual inspection of vocal cord     1973   WISDOM TOOTH EXTRACTION     Social History:  Social History   Socioeconomic History   Marital status: Married    Spouse name: Not on file   Number of children: Not on file   Years of education: Not on file   Highest education level: Not on file  Occupational History   Not on file  Tobacco Use   Smoking status: Never   Smokeless tobacco: Never  Vaping Use   Vaping status: Never Used  Substance and Sexual Activity   Alcohol use: Not Currently    Comment: rare   Drug use: No   Sexual activity: Not on file  Other Topics Concern   Not on file  Social History Narrative   Not on file   Social Drivers of Health   Financial Resource  Strain: Low Risk  (12/05/2022)   [facility]   Overall Financial Resource Strain (CARDIA)    Difficulty of Paying Living Expenses: Not hard at all  Food Insecurity: No Food Insecurity (05/26/2023)   Hunger Vital Sign    Worried About Running Out of Food in the Last Year: Never true    Ran Out of Food in the Last Year: Never true  Transportation Needs: No Transportation Needs (05/26/2023)   PRAPARE - Administrator, Civil Service (Medical): No    Lack of Transportation (Non-Medical): No  Physical Activity: Not on file  Stress: Not on file  Social Connections: Not on file  Intimate Partner Violence: Not At Risk (05/26/2023)   Humiliation, Afraid, Rape, and Kick questionnaire    Fear of Current or Ex-Partner: No    Emotionally Abused: No    Physically Abused: No    Sexually Abused: No  Family History:  Family History  Problem Relation Age of Onset   Hyperlipidemia Father    Hypertension Father    Diabetes Father    Benign prostatic hyperplasia Father    Stroke Maternal Grandmother    Diabetes Maternal Grandmother    Stroke Paternal Grandmother    Hypertension Paternal Grandmother    Kidney disease Neg Hx    Prostate cancer Neg Hx     Review of Systems: Constitutional: Doesn't report fevers, chills or abnormal weight loss Eyes: Doesn't report blurriness of vision Ears, nose, mouth, throat, and face: Doesn't report sore throat Respiratory: Doesn't report cough, dyspnea or wheezes Cardiovascular: Doesn't report palpitation, chest discomfort  Gastrointestinal:  Doesn't report nausea, constipation, diarrhea GU: Doesn't report incontinence Skin: Doesn't report skin rashes Neurological: Per HPI Musculoskeletal: Doesn't report joint pain Behavioral/Psych: Doesn't report anxiety  Physical Exam: Vitals:   08/10/23 1221  BP: (!) 149/89  Pulse: 61  Resp: 18  Temp: (!) 97.4 F (36.3 C)  SpO2: 100%    KPS: 70. General: Fatigued Head: Normal EENT: No conjunctival injection or scleral icterus.  Lungs: Resp effort normal Cardiac: Regular rate Abdomen: Non-distended abdomen Skin: No rashes cyanosis or petechiae. Extremities: No clubbing or edema  Neurologic Exam: Mental Status: Awake, alert, attentive to examiner. Oriented to self and environment.  Language is notable for impaired fluency, with additional impairments in comprehension.  Modest psychomotor slowing, impaired 3 object recall Cranial Nerves: Visual acuity is grossly normal. Visual fields are full. Extra-ocular movements intact. No ptosis. Face is symmetric Motor: Tone and bulk are normal. Power is full in both arms and legs. Reflexes are symmetric, no pathologic reflexes present.  Sensory: Intact to light touch Gait: Normal.   Labs: I have reviewed the data as listed    Component Value Date/Time   NA 137 05/22/2023 0936   NA 138 05/04/2012 1815   K 4.2 05/22/2023 0936   K 3.5 05/04/2012 1815   CL 101 05/22/2023 0936   CL 99 05/04/2012 1815   CO2 30 05/22/2023 0936   CO2 29 05/04/2012 1815   GLUCOSE 161 (H) 05/22/2023 0936   GLUCOSE 69 05/04/2012 1815   BUN 14 05/22/2023 0936   BUN 12 05/04/2012 1815   CREATININE 0.88 05/22/2023 0936   CREATININE 0.90 11/14/2022 1025   CALCIUM 9.3 05/22/2023 0936   CALCIUM 9.2 05/04/2012 1815   PROT 6.7 05/22/2023 0936   PROT 8.5 (H) 05/04/2012 1815   ALBUMIN 4.3 05/22/2023 0936   ALBUMIN 4.5 05/04/2012 1815   AST 15 05/22/2023 0936   ALT 16 05/22/2023 0936   ALT 77 05/04/2012 1815   ALKPHOS 95 05/22/2023 0936   ALKPHOS 129 05/04/2012 1815   BILITOT 0.5 05/22/2023 0936   GFRNONAA >60 05/22/2023 0936   GFRNONAA 106 12/05/2020 1014   GFRAA 123 12/05/2020 1014   Lab Results  Component Value Date   WBC 7.2 08/10/2023   NEUTROABS 4.8 08/10/2023   HGB 15.3 08/10/2023   HCT 44.3 08/10/2023   MCV 83.0 08/10/2023   PLT 208 08/10/2023     Pathology report from June  2024 biopsy: A-B. Brain tissue, right frontal lesion, biopsy:   Brain tissue with changes consistent with treatment effect; see comment.       Comment: There is no definitive morphologic, immunophenotypic, or molecular evidence of the patient's known oligodendroglioma, IDH-mutant and 1p/19q-codeleted; see comment. The specimen is negative for mutations in the TERT promoter and in IDH1/2 by Sanger sequencing.  Reviewed by resident/fellow Bernadette Hoit, GREGORY  Electronically signed by Ulanda Edison, MD on 12/23/2022     Assessment/Plan Radiation therapy induced brain necrosis  Low grade glioma of cerebrum (HCC)   Alan Wise presents today for cycle #7 of avastin 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    Seizures are well controlled thus far on Lacmictal 100/mg BID.  Cognitive and language impairments are again noted today.  Although Vitamin D was found to be be low, no other labs were abnormal.  We suspect delayed toxicity from radiation and radiation necrosis affecting dominant right hemisphere language centers.  Recommended continuing with cycle #7 avastin 10mg /kg IV q4 weeks.  We reviewed side effects including hypertension, wound healing impairment.  Urine protein will be checked every other cycle in addition to CBC.  Informed consent was obtained verbally at bedside to proceed with antibody therapy.  Avastin should be held for the following:  ANC less than 500  Platelets less than 50,000  LFT or creatinine greater than 2x ULN  If clinical concerns/contraindications develop  Con't Propanolol 20mg  BID which may help with headache prevention as well as blood pressure.  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  Will continue Lamictal 100mg  BID.  Vitamin D supplementation was recommended and ordered for 1000IU daily.  We ask that Alan Wise return to clinic in 4 weeks following  for cycle #8 avastin, or sooner if needed.  Next MRI should follow cycle #9, sometime in May.  All questions were answered. The patient knows to call the clinic with any problems, questions or concerns. No barriers to learning were detected.  The total time spent in the encounter was 40 minutes and more than 50% was on counseling and review of test results   Henreitta Leber, MD Medical Director of Neuro-Oncology [facility] at [facility] 08/10/23 12:40 PM

## 2023-08-10 NOTE — Patient Instructions (Signed)
 [facility] - A DEPT OF [facility]  Discharge Instructions: Thank you for choosing [facility] to provide your oncology and hematology care.   If you have a lab appointment with the Cancer Center, please go directly to the Cancer Center and check in at the registration area.   Wear comfortable clothing and clothing appropriate for easy access to any Portacath or PICC line.   We strive to give you quality time with your provider. You may need to reschedule your appointment if you arrive late (15 or more minutes).  Arriving late affects you and other patients whose appointments are after yours.  Also, if you miss three or more appointments without notifying the office, you may be dismissed from the clinic at the provider's discretion.      For prescription refill requests, have your pharmacy contact our office and allow 72 hours for refills to be completed.    Today you received the following chemotherapy and/or immunotherapy agents: bevacizumab      To help prevent nausea and vomiting after your treatment, we encourage you to take your nausea medication as directed.  BELOW ARE SYMPTOMS THAT SHOULD BE REPORTED IMMEDIATELY: *FEVER GREATER THAN 100.4 F (38 C) OR HIGHER *CHILLS OR SWEATING *NAUSEA AND VOMITING THAT IS NOT CONTROLLED WITH YOUR NAUSEA MEDICATION *UNUSUAL SHORTNESS OF BREATH *UNUSUAL BRUISING OR BLEEDING *URINARY PROBLEMS (pain or burning when urinating, or frequent urination) *BOWEL PROBLEMS (unusual diarrhea, constipation, pain near the anus) TENDERNESS IN MOUTH AND THROAT WITH OR WITHOUT PRESENCE OF ULCERS (sore throat, sores in mouth, or a toothache) UNUSUAL RASH, SWELLING OR PAIN  UNUSUAL VAGINAL DISCHARGE OR ITCHING   Items with * indicate a potential emergency and should be followed up as soon as possible or go to the Emergency Department if any problems should occur.  Please show the CHEMOTHERAPY ALERT CARD or  IMMUNOTHERAPY ALERT CARD at check-in to the Emergency Department and triage nurse.  Should you have questions after your visit or need to cancel or reschedule your appointment, please contact [facility] - A DEPT OF [facility]  Dept: [phone]  and follow the prompts.  Office hours are 8:00 a.m. to 4:30 p.m. Monday - Friday. Please note that voicemails left after 4:00 p.m. may not be returned until the following business day.  We are closed weekends and major holidays. You have access to a nurse at all times for urgent questions. Please call the main number to the clinic Dept: [phone] and follow the prompts.   For any non-urgent questions, you may also contact your provider using MyChart. We now offer e-Visits for anyone 81 and older to request care online for non-urgent symptoms. For details visit [email or web address].   Also download the MyChart app! Go to the app store, search "MyChart", open the app, select [facility], and log in with your MyChart username and password.

## 2023-08-11 ENCOUNTER — Encounter: Payer: 59 | Admitting: Speech Pathology

## 2023-08-12 ENCOUNTER — Other Ambulatory Visit: Payer: Self-pay

## 2023-08-13 ENCOUNTER — Encounter: Payer: 59 | Admitting: Speech Pathology

## 2023-08-18 ENCOUNTER — Encounter: Payer: 59 | Admitting: Speech Pathology

## 2023-08-18 ENCOUNTER — Other Ambulatory Visit: Payer: Self-pay | Admitting: Internal Medicine

## 2023-08-19 ENCOUNTER — Encounter: Payer: Self-pay | Admitting: Internal Medicine

## 2023-08-19 ENCOUNTER — Ambulatory Visit: Payer: 59 | Admitting: Internal Medicine

## 2023-08-19 VITALS — BP 136/80 | Ht 68.0 in | Wt 149.0 lb

## 2023-08-19 DIAGNOSIS — G40901 Epilepsy, unspecified, not intractable, with status epilepticus: Secondary | ICD-10-CM

## 2023-08-19 DIAGNOSIS — E1169 Type 2 diabetes mellitus with other specified complication: Secondary | ICD-10-CM | POA: Diagnosis not present

## 2023-08-19 DIAGNOSIS — I6789 Other cerebrovascular disease: Secondary | ICD-10-CM

## 2023-08-19 DIAGNOSIS — E1165 Type 2 diabetes mellitus with hyperglycemia: Secondary | ICD-10-CM | POA: Diagnosis not present

## 2023-08-19 DIAGNOSIS — R569 Unspecified convulsions: Secondary | ICD-10-CM

## 2023-08-19 DIAGNOSIS — C719 Malignant neoplasm of brain, unspecified: Secondary | ICD-10-CM

## 2023-08-19 DIAGNOSIS — E785 Hyperlipidemia, unspecified: Secondary | ICD-10-CM

## 2023-08-19 DIAGNOSIS — Y842 Radiological procedure and radiotherapy as the cause of abnormal reaction of the patient, or of later complication, without mention of misadventure at the time of the procedure: Secondary | ICD-10-CM

## 2023-08-19 DIAGNOSIS — C710 Malignant neoplasm of cerebrum, except lobes and ventricles: Secondary | ICD-10-CM | POA: Diagnosis not present

## 2023-08-19 NOTE — Assessment & Plan Note (Signed)
 Lipid profile today He has declined taking cholesterol lowering medication at this time despite his known risk for heart attack, stroke and death Encouraged him to consume a low-fat diet

## 2023-08-19 NOTE — Assessment & Plan Note (Signed)
 Followed by neuro-oncology

## 2023-08-19 NOTE — Assessment & Plan Note (Signed)
 Continue lamotrigine and avastin as prescribed by oncology/neurology

## 2023-08-19 NOTE — Patient Instructions (Signed)

## 2023-08-19 NOTE — Assessment & Plan Note (Signed)
 Continue lamotrigine prescribed by neurology

## 2023-08-19 NOTE — Progress Notes (Signed)
 Subjective:    Patient ID: Alan Wise, male    DOB: [birth year], 53 y.o.   MRN: [id number]  HPI  Patient presents to clinic today for 73-month follow-up of chronic conditions.  HLD: His last LDL was 134, triglycerides 122, 10/2022.  He is not taking atorvastatin as prescribed.  He tries to consume low-fat diet.  DM2: His last A1c was 7.6%, 01/2023.  He is not taking any oral diabetic medication at this time but has been on glipizide and januvia in the past.  He does not check his sugars.  He checks his feet routinely.  His last eye exam was 12/2022.  Flu never.  Pneumovax never.  COVID Janssen x 1.  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 He is having more difficulty with expressive aphasia, slurred speech.  He is taking avastin, lamotrigine as prescribed.  He follows with neurology and oncology.  HTN: His BP today is 142/88.  He is taking amlodipine as prescribed.  ECG from 04/2023 reviewed.  Review of Systems     Past Medical History:  Diagnosis Date   Allergy    Complication of anesthesia    pt reports he is starting to have more difficulty arousing after surgery   Diabetes mellitus (HCC)    GERD (gastroesophageal reflux disease)    Headache    History of kidney stones    Hyperlipidemia    Renal disorder    kidney stones    Current Outpatient Medications  Medication Sig Dispense Refill   amLODipine (NORVASC) 5 MG tablet Take 1 tablet (5 mg total) by mouth daily. 30 tablet 2   cholecalciferol (VITAMIN D3) 25 MCG (1000 UNIT) tablet Take 1 tablet (1,000 Units total) by mouth daily. 30 tablet 2   diazePAM, 15 MG Dose, (VALTOCO 15 MG DOSE) 2 x 7.5 MG/0.1ML LQPK Place 15 mg into the nose once as needed for up to 1 dose (seizure). (Patient not taking: Reported on 08/10/2023) 2 each 0   lamoTRIgine (LAMICTAL) 100 MG tablet Take 1 tablet (100 mg total) by mouth 2 (two) times daily. 60 tablet 2   propranolol (INDERAL) 20 MG tablet  Take 1 tablet (20 mg total) by mouth 2 (two) times daily. 60 tablet 1   No current facility-administered medications for this visit.    Allergies  Allergen Reactions   Contrast Media [Iodinated Contrast Media] Swelling    Family History  Problem Relation Age of Onset   Hyperlipidemia Father    Hypertension Father    Diabetes Father    Benign prostatic hyperplasia Father    Stroke Maternal Grandmother    Diabetes Maternal Grandmother    Stroke Paternal Grandmother    Hypertension Paternal Grandmother    Kidney disease Neg Hx    Prostate cancer Neg Hx     Social History   Socioeconomic History   Marital status: Married    Spouse name: Not on file   Number of children: Not on file   Years of education: Not on file   Highest education level: Not on file  Occupational History   Not on file  Tobacco Use   Smoking status: Never   Smokeless tobacco: Never  Vaping Use   Vaping status: Never Used  Substance and Sexual Activity   Alcohol use: Not Currently    Comment: rare   Drug use: No   Sexual activity: Not on file  Other Topics Concern   Not on file  Social History  Narrative   Not on file   Social Drivers of Health   Financial Resource Strain: Low Risk  (12/05/2022)   [facility]   Overall Financial Resource Strain (CARDIA)    Difficulty of Paying Living Expenses: Not hard at all  Food Insecurity: No Food Insecurity (05/26/2023)   Hunger Vital Sign    Worried About Running Out of Food in the Last Year: Never true    Ran Out of Food in the Last Year: Never true  Transportation Needs: No Transportation Needs (05/26/2023)   PRAPARE - Administrator, Civil Service (Medical): No    Lack of Transportation (Non-Medical): No  Physical Activity: Not on file  Stress: Not on file  Social Connections: Not on file  Intimate Partner Violence: Not At Risk (05/26/2023)   Humiliation, Afraid, Rape, and  Kick questionnaire    Fear of Current or Ex-Partner: No    Emotionally Abused: No    Physically Abused: No    Sexually Abused: No     Constitutional: Denies fever, malaise, fatigue, headache or abrupt weight changes.  HEENT: Denies eye pain, eye redness, ear pain, ringing in the ears, wax buildup, runny nose, nasal congestion, bloody nose, or sore throat. Respiratory: Denies difficulty breathing, shortness of breath, cough or sputum production.   Cardiovascular: Denies chest pain, chest tightness, palpitations or swelling in the hands or feet.  Gastrointestinal: Denies abdominal pain, bloating, constipation, diarrhea or blood in the stool.  GU: Denies urgency, frequency, pain with urination, burning sensation, blood in urine, odor or discharge. Musculoskeletal: Denies decrease in range of motion, difficulty with gait, muscle pain or joint pain and swelling.  Skin: Denies redness, rashes, lesions or ulcercations.  Neurological: Patient has some expressive aphasia, slurred speech.  Denies dizziness, difficulty with memory, difficulty with speech or problems with balance and coordination.  Psych: Denies anxiety, depression, SI/HI.  No other specific complaints in a complete review of systems (except as listed in HPI above).  Objective:   Physical Exam  BP 136/80   Ht 5\' 8"  (1.727 m)   Wt 149 lb (67.6 kg)   BMI 22.66 kg/m   Wt Readings from Last 3 Encounters:  08/10/23 150 lb 6.4 oz (68.2 kg)  07/09/23 148 lb 9.6 oz (67.4 kg)  06/11/23 152 lb 8 oz (69.2 kg)    General: Appears his stated age, well developed, well nourished in NAD. Skin: Warm, dry and intact.  Abrasion noted to left anterior shin.  No ulcerations noted. HEENT: Head: normal shape and size; Eyes: sclera white, no icterus, conjunctiva pink, PERRLA and EOMs intact;  Cardiovascular: Normal rate and rhythm. S1,S2 noted.  No murmur, rubs or gallops noted. No JVD or BLE edema. No carotid bruits noted. Pulmonary/Chest:  Normal effort and positive vesicular breath sounds. No respiratory distress. No wheezes, rales or ronchi noted.  Musculoskeletal:  No difficulty with gait.  Neurological: Alert and oriented.  Expressive aphasia noted.  Coordination normal.  Psychiatric: Mood and affect normal. Behavior is normal. Judgment and thought content normal.     BMET    Component Value Date/Time   NA 137 05/22/2023 0936   NA 138 05/04/2012 1815   K 4.2 05/22/2023 0936   K 3.5 05/04/2012 1815   CL 101 05/22/2023 0936   CL 99 05/04/2012 1815   CO2 30 05/22/2023 0936   CO2 29 05/04/2012 1815   GLUCOSE 161 (H) 05/22/2023 0936   GLUCOSE 69 05/04/2012  1815   BUN 14 05/22/2023 0936   BUN 12 05/04/2012 1815   CREATININE 0.88 05/22/2023 0936   CREATININE 0.90 11/14/2022 1025   CALCIUM 9.3 05/22/2023 0936   CALCIUM 9.2 05/04/2012 1815   GFRNONAA >60 05/22/2023 0936   GFRNONAA 106 12/05/2020 1014   GFRAA 123 12/05/2020 1014    Lipid Panel     Component Value Date/Time   CHOL 190 11/14/2022 1025   TRIG 122 11/14/2022 1025   HDL 33 (L) 11/14/2022 1025   CHOLHDL 5.8 (H) 11/14/2022 1025   VLDL 60.6 (H) 08/30/2020 1506   LDLCALC 134 (H) 11/14/2022 1025    CBC    Component Value Date/Time   WBC 7.2 08/10/2023 1132   WBC 7.6 05/15/2023 0615   RBC 5.34 08/10/2023 1132   HGB 15.3 08/10/2023 1132   HGB 16.4 05/04/2012 1815   HCT 44.3 08/10/2023 1132   HCT 48.4 05/04/2012 1815   PLT 208 08/10/2023 1132   PLT 261 05/04/2012 1815   MCV 83.0 08/10/2023 1132   MCV 86 05/04/2012 1815   MCH 28.7 08/10/2023 1132   MCHC 34.5 08/10/2023 1132   RDW 12.4 08/10/2023 1132   RDW 12.7 05/04/2012 1815   LYMPHSABS 1.3 08/10/2023 1132   MONOABS 0.5 08/10/2023 1132   EOSABS 0.5 08/10/2023 1132   BASOSABS 0.1 08/10/2023 1132    Hgb A1C Lab Results  Component Value Date   HGBA1C 7.6 (A) 02/16/2023           Assessment & Plan:     RTC in 3 months for your annual exam Nicki Reaper, NP

## 2023-08-19 NOTE — Assessment & Plan Note (Signed)
 A1c and urine microalbumin today Encourage low-carb diet He has declined to take any diabetic medication at this time despite his increased risk of heart attack, stroke and death which he is aware of Encouraged routine eye exam Encouraged routine foot exam He declines immunizations

## 2023-08-20 ENCOUNTER — Encounter: Payer: Self-pay | Admitting: Internal Medicine

## 2023-08-20 ENCOUNTER — Other Ambulatory Visit: Payer: Self-pay

## 2023-08-20 ENCOUNTER — Encounter: Payer: 59 | Admitting: Speech Pathology

## 2023-08-20 LAB — LIPID PANEL
Cholesterol: 226 mg/dL — ABNORMAL HIGH (ref <200)
HDL: 36 mg/dL — ABNORMAL LOW (ref >=40)
LDL Cholesterol (Calc): 161 mg/dL — ABNORMAL HIGH
Non-HDL Cholesterol (Calc): 190 mg/dL — ABNORMAL HIGH (ref <130)
Total CHOL/HDL Ratio: 6.3 (calc) — ABNORMAL HIGH (ref <5.0)
Triglycerides: 147 mg/dL (ref <150)

## 2023-08-20 LAB — COMPLETE METABOLIC PANEL WITH GFR
AG Ratio: 1.9 (calc) (ref 1.0–2.5)
ALT: 33 U/L (ref 9–46)
AST: 21 U/L (ref 10–35)
Albumin: 4.5 g/dL (ref 3.6–5.1)
Alkaline phosphatase (APISO): 80 U/L (ref 35–144)
BUN: 14 mg/dL (ref 7–25)
CO2: 33 mmol/L — ABNORMAL HIGH (ref 20–32)
Calcium: 9.8 mg/dL (ref 8.6–10.3)
Chloride: 96 mmol/L — ABNORMAL LOW (ref 98–110)
Creat: 0.95 mg/dL (ref 0.70–1.30)
Globulin: 2.4 g/dL (ref 1.9–3.7)
Glucose, Bld: 184 mg/dL — ABNORMAL HIGH (ref 65–99)
Potassium: 4.3 mmol/L (ref 3.5–5.3)
Sodium: 137 mmol/L (ref 135–146)
Total Bilirubin: 0.5 mg/dL (ref 0.2–1.2)
Total Protein: 6.9 g/dL (ref 6.1–8.1)
eGFR: 97 mL/min/{1.73_m2} (ref >=60)

## 2023-08-20 LAB — HEMOGLOBIN A1C
Hgb A1c MFr Bld: 6.3 %{Hb} — ABNORMAL HIGH (ref <5.7)
Mean Plasma Glucose: 134 mg/dL
eAG (mmol/L): 7.4 mmol/L

## 2023-08-20 LAB — MICROALBUMIN / CREATININE URINE RATIO
Creatinine, Urine: 121 mg/dL (ref 20–320)
Microalb Creat Ratio: 270 mg/g{creat} — ABNORMAL HIGH (ref <30)
Microalb, Ur: 32.7 mg/dL

## 2023-08-24 ENCOUNTER — Encounter: Payer: Self-pay | Admitting: Speech Pathology

## 2023-08-24 ENCOUNTER — Other Ambulatory Visit: Payer: Self-pay

## 2023-08-24 ENCOUNTER — Ambulatory Visit: Payer: 59 | Attending: Internal Medicine | Admitting: Speech Pathology

## 2023-08-24 DIAGNOSIS — R4701 Aphasia: Secondary | ICD-10-CM | POA: Insufficient documentation

## 2023-08-24 DIAGNOSIS — R41841 Cognitive communication deficit: Secondary | ICD-10-CM | POA: Insufficient documentation

## 2023-08-24 NOTE — Patient Instructions (Signed)
  [facility] - Get ID card  Center with large print communication supports  Use the handouts to help cue y'all for frequently used personally relevant words

## 2023-08-24 NOTE — Therapy (Addendum)
 OUTPATIENT SPEECH LANGUAGE PATHOLOGY APHASIA EVALUATION   Patient Name: Alan Wise MRN: [id number] DOB:17-Jan-1971, 53 y.o., male Today's Date: 08/24/2023  PCP: Alan Munroe, NP REFERRING PROVIDER: Henreitta Leber, MD  END OF SESSION:  End of Session - 08/24/23 1327     Visit Number 1    Number of Visits 17    Date for SLP Re-Evaluation 10/19/23    Authorization Type [employer]    SLP Start Time 1320    SLP Stop Time  1400    SLP Time Calculation (min) 40 min    Activity Tolerance Patient tolerated treatment well             Past Medical History:  Diagnosis Date   Allergy    Complication of anesthesia    pt reports he is starting to have more difficulty arousing after surgery   Diabetes mellitus (HCC)    GERD (gastroesophageal reflux disease)    Headache    History of kidney stones    Hyperlipidemia    Renal disorder    kidney stones   Past Surgical History:  Procedure Laterality Date   APPLICATION OF CRANIAL NAVIGATION Right 01/17/2021   Procedure: APPLICATION OF CRANIAL NAVIGATION;  Surgeon: Alan Person, MD;  Location: [facility] OR;  Service: Neurosurgery;  Laterality: Right;   COLONOSCOPY  09/03/1994   COLONOSCOPY WITH PROPOFOL N/A 01/07/2023   Procedure: COLONOSCOPY WITH PROPOFOL;  Surgeon: Alan Minium, MD;  Location: [facility] ENDOSCOPY;  Service: Endoscopy;  Laterality: N/A;  NOT TOO EARLY   CYSTOSCOPY WITH STENT PLACEMENT Right 01/25/2016   Procedure: CYSTOSCOPY WITH STENT PLACEMENT;  Surgeon: Alan Laser, MD;  Location: [facility] ORS;  Service: Urology;  Laterality: Right;   FRAMELESS  BIOPSY WITH BRAINLAB Right 01/17/2021   Procedure: RIGHT STEREOTACTIC BIOPSY OF INSULAR LESION;  Surgeon: Alan Person, MD;  Location: [facility] OR;  Service: Neurosurgery;  Laterality: Right;   KNEE ARTHROSCOPY W/ MENISCAL REPAIR Right 06/23/1988   POLYPECTOMY  01/07/2023   Procedure: POLYPECTOMY;  Surgeon: Alan Minium, MD;  Location: [facility] ENDOSCOPY;   Service: Endoscopy;;   removal of birthmark  06/08/2013   SKIN CANCER EXCISION  06/23/2012   TYMPANOSTOMY TUBE PLACEMENT  08/06/1978   URETEROSCOPY WITH HOLMIUM Wise LITHOTRIPSY Right 01/25/2016   Procedure: URETEROSCOPY WITH HOLMIUM Wise LITHOTRIPSY;  Surgeon: Alan Laser, MD;  Location: [facility] ORS;  Service: Urology;  Laterality: Right;   URETHRAL STRICTURE DILATATION     visual inspection of vocal cord     1973   WISDOM TOOTH EXTRACTION     Patient Active Problem List   Diagnosis Date Noted   Status epilepticus (HCC) 05/14/2023   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/2021   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   Focal seizures (HCC) 12/31/2020   Type 2 diabetes mellitus with hyperglycemia (HCC) 06/12/2014   Hyperlipidemia associated with type 2 diabetes mellitus (HCC) 06/12/2014    ONSET DATE: 08/10/2023 (referral date)  REFERRING DIAG:  Diagnosis  I67.89,Y84.2 (ICD-10-CM) - Radiation therapy induced brain necrosis  C71.9 (ICD-10-CM) - Oligodendroglioma (HCC)    THERAPY DIAG:  Aphasia  Cognitive communication deficit  Rationale for Evaluation and Treatment: Rehabilitation  SUBJECTIVE:   SUBJECTIVE STATEMENT: "We need some ways to communicate" Pt accompanied by: significant other Alan Wise  PERTINENT HISTORY: Alan Wise is a left handed 53 year old male with right frontal oligodendroglioma. The below is his oncology history with pt's wife reports language  impairment following right frontal biopsy on 12/03/2022. Pt with history of seizures and is currently undergoing avastin infusions for help managing radiation necrosis.J  Fortunately no further seizures since last infusion.  Cognition continues to decline, though less change compared to last month.  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  Wife still has to supervise his emails to correct frequent errors.  He  plans to retire at the end of the semester. Headaches remain improved since starting propanolol  PAIN:  Are you having pain? No  FALLS: Has patient fallen in last 6 months?  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  COGNITION: Overall cognitive status: Difficulty to assess due to: Communication impairment Areas of impairment:   Functional deficits:   AUDITORY COMPREHENSION: Overall auditory comprehension: Impaired: simple YES/NO questions: Impaired: simple and moderately complex Following directions: Appears intact Conversation: Moderately Complex Interfering components: processing speed Effective technique: extra processing time, pausing, repetition/stressing words, slowed speech, written cues, and visual/gestural cues  READING COMPREHENSION: Impaired: phrase  EXPRESSION: verbal  VERBAL EXPRESSION: Level of generative/spontaneous verbalization: phrase Automatic speech: name: intact and social response: intact  Repetition: Impaired: sentence Naming: Confrontation: 26-50% and Divergent: 0-25% Pragmatics: Appears intact Comments:  Interfering components:    Effective technique: sentence completion, phonemic cues, and written cues Non-verbal means of communication: N/A  WRITTEN EXPRESSION: Dominant hand: right Written expression: Impaired: phrase  MOTOR SPEECH: Overall motor speech: Appears intact Level of impairment:    Respiration: thoracic breathing Phonation: normal Resonance: WFL Articulation: Appears intact Intelligibility: Intelligible Motor planning:  Appears intact Motor speech errors:    Interfering components:    Effective technique:     ORAL MOTOR EXAMINATION: Overall status: WFL Comments:   STANDARDIZED ASSESSMENTS: QAB: Moderate 6.54  PATIENT REPORTED OUTCOME MEASURES (PROM): deferred                                                                                                                             TREATMENT DATE:   08/24/23 (eval completed): Initiated training and generation of low tech communication supports - generated page with his name, birth date, address and phone as well as spouse's name and phone, children's names and parents' names and phone numbers. Centered the font due to left neglect and used large font. Provided aphasia education. Damian demonstrated use of communication support page to check out and schedule therapy communicating with our front office. Provided worksheets to cue pt and spouse to generate more personally relevant words to add to communication support notebook. Modeled slow rate, pausing and writing key words for strategies spouse can use  to support Deandra's auditory comprehension, with education to spouse on these strategies.    PATIENT EDUCATION: Education details: See Today's Treatment, See patient instructions, compensations for aphasia, use of AAC Wise educated: Patient and Spouse Education method: Explanation, Demonstration, Verbal cues, and Handouts Education comprehension: verbalized understanding, returned demonstration, verbal cues required, and needs further education   GOALS: Goals reviewed with patient? Yes  SHORT TERM GOALS: Target date: 09/21/23  Pt will use multimodal communication to communicate family names and biographical information with rare min A Baseline: Goal status: INITIAL  2.  Pt and spouse will generate 4 personally relevant places and orders to add to communication support book with occasional min A Baseline:  Goal status: INITIAL  3.  Pt will  use multimodal communication to name 8 items in a personally relevant category with occasional min A Baseline:  Goal status: INITIAL  4.  Pt will use external aids to attend to the left when reading sentences  Baseline:  Goal status: INITIAL  5.  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  Pt will use multimodal communication to communicate preferences in simple conversation Baseline:  Goal status: INITIAL  3.  Pt and spouse will carryover 3 compensatory strategies to support pt's auditory comprehension Baseline:  Goal status: INITIAL  4.  Pt will generate moderately complex sentences in structured task such as VNeST with usual min A Baseline:  Goal status: INITIAL  5.  Pt and spouse will carryover 1-2 language enhancing activities at home daily 5/7 days Baseline:  Goal status: INITIAL  6.  Pt and spouse will add 6 new topics/words to communication support notebook Baseline:  Goal status: INITIAL  ASSESSMENT:  CLINICAL IMPRESSION: Patient is a 53 y.o. male who was seen today for moderate aphasia c/b empty, vague language, halting for word finding difficulties. Auditory comprehension impairments noted on QAB as well as in conversation gathering history. Written expression impaired at phrase level, spouse endorses texts often don't make sense. "He texts like he speaks". Due to aphasia, Fionn plans to retire at the end of this semester. 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 He had difficulty stating spouse and children's names and his address. He endorses significant frustration.  They do endorse some attempts at multimodal communication with inconsistent success. Spouse  states "I can't listen to what he says, I have to look at my phone to try to understand him. I recommend skilled ST to maximize communication for safety, to reduce caregiver burden and improve QOL.   OBJECTIVE IMPAIRMENTS: include aphasia. These impairments are limiting patient from return to work, household responsibilities, ADLs/IADLs, and effectively communicating at home and in community. Factors affecting potential to achieve goals and functional outcome are medical prognosis. Patient will benefit from skilled SLP services to address above impairments and improve overall function.  REHAB POTENTIAL: Good  PLAN:  SLP FREQUENCY: 2x/week  SLP DURATION: 12 weeks  PLANNED INTERVENTIONS: Environmental controls, Cognitive reorganization, Internal/external aids, Functional tasks, Multimodal communication approach, SLP instruction and feedback, and Compensatory strategies    Deano Tomaszewski, Radene Journey, CCC-SLP 08/24/2023, 1:27 PM

## 2023-08-25 ENCOUNTER — Ambulatory Visit: Payer: 59 | Admitting: Speech Pathology

## 2023-08-27 ENCOUNTER — Ambulatory Visit: Payer: 59 | Admitting: Speech Pathology

## 2023-08-31 ENCOUNTER — Telehealth: Payer: Self-pay | Admitting: Internal Medicine

## 2023-08-31 NOTE — Telephone Encounter (Signed)
 Patient Scheduled appts. Patient is aware of all appt details.

## 2023-09-01 ENCOUNTER — Ambulatory Visit: Payer: 59 | Admitting: Speech Pathology

## 2023-09-03 ENCOUNTER — Ambulatory Visit: Payer: 59 | Admitting: Speech Pathology

## 2023-09-04 ENCOUNTER — Encounter: Payer: Self-pay | Admitting: Internal Medicine

## 2023-09-07 ENCOUNTER — Inpatient Hospital Stay: Payer: 59

## 2023-09-07 ENCOUNTER — Other Ambulatory Visit: Payer: Self-pay | Admitting: *Deleted

## 2023-09-07 ENCOUNTER — Ambulatory Visit
Admission: RE | Admit: 2023-09-07 | Discharge: 2023-09-07 | Disposition: A | Discharge status: Home / Self Care | Source: Ambulatory Visit | Attending: Internal Medicine | Admitting: Internal Medicine

## 2023-09-07 ENCOUNTER — Inpatient Hospital Stay: Payer: 59 | Attending: Internal Medicine | Admitting: Internal Medicine

## 2023-09-07 ENCOUNTER — Inpatient Hospital Stay: Payer: 59 | Attending: Internal Medicine

## 2023-09-07 VITALS — BP 149/84 | HR 57 | Temp 97.6°F | Resp 18 | Ht 68.0 in | Wt 148.1 lb

## 2023-09-07 DIAGNOSIS — E785 Hyperlipidemia, unspecified: Secondary | ICD-10-CM | POA: Insufficient documentation

## 2023-09-07 DIAGNOSIS — I639 Cerebral infarction, unspecified: Secondary | ICD-10-CM | POA: Diagnosis not present

## 2023-09-07 DIAGNOSIS — E11649 Type 2 diabetes mellitus with hypoglycemia without coma: Secondary | ICD-10-CM | POA: Diagnosis not present

## 2023-09-07 DIAGNOSIS — R569 Unspecified convulsions: Secondary | ICD-10-CM | POA: Diagnosis not present

## 2023-09-07 DIAGNOSIS — C711 Malignant neoplasm of frontal lobe: Secondary | ICD-10-CM | POA: Insufficient documentation

## 2023-09-07 DIAGNOSIS — E119 Type 2 diabetes mellitus without complications: Secondary | ICD-10-CM | POA: Insufficient documentation

## 2023-09-07 DIAGNOSIS — I6381 Other cerebral infarction due to occlusion or stenosis of small artery: Secondary | ICD-10-CM | POA: Diagnosis not present

## 2023-09-07 DIAGNOSIS — Y842 Radiological procedure and radiotherapy as the cause of abnormal reaction of the patient, or of later complication, without mention of misadventure at the time of the procedure: Secondary | ICD-10-CM | POA: Diagnosis not present

## 2023-09-07 DIAGNOSIS — Z87442 Personal history of urinary calculi: Secondary | ICD-10-CM | POA: Diagnosis not present

## 2023-09-07 DIAGNOSIS — I6789 Other cerebrovascular disease: Secondary | ICD-10-CM

## 2023-09-07 DIAGNOSIS — Z9221 Personal history of antineoplastic chemotherapy: Secondary | ICD-10-CM | POA: Diagnosis not present

## 2023-09-07 DIAGNOSIS — R251 Tremor, unspecified: Secondary | ICD-10-CM | POA: Insufficient documentation

## 2023-09-07 DIAGNOSIS — E1165 Type 2 diabetes mellitus with hyperglycemia: Secondary | ICD-10-CM | POA: Diagnosis not present

## 2023-09-07 DIAGNOSIS — Z923 Personal history of irradiation: Secondary | ICD-10-CM | POA: Insufficient documentation

## 2023-09-07 DIAGNOSIS — Z8673 Personal history of transient ischemic attack (TIA), and cerebral infarction without residual deficits: Secondary | ICD-10-CM | POA: Diagnosis not present

## 2023-09-07 DIAGNOSIS — Z7982 Long term (current) use of aspirin: Secondary | ICD-10-CM | POA: Insufficient documentation

## 2023-09-07 DIAGNOSIS — C719 Malignant neoplasm of brain, unspecified: Secondary | ICD-10-CM

## 2023-09-07 DIAGNOSIS — R059 Cough, unspecified: Secondary | ICD-10-CM | POA: Diagnosis not present

## 2023-09-07 DIAGNOSIS — R4182 Altered mental status, unspecified: Secondary | ICD-10-CM | POA: Diagnosis present

## 2023-09-07 DIAGNOSIS — Z79899 Other long term (current) drug therapy: Secondary | ICD-10-CM | POA: Diagnosis not present

## 2023-09-07 DIAGNOSIS — G40901 Epilepsy, unspecified, not intractable, with status epilepticus: Secondary | ICD-10-CM | POA: Diagnosis not present

## 2023-09-07 DIAGNOSIS — Z794 Long term (current) use of insulin: Secondary | ICD-10-CM | POA: Diagnosis not present

## 2023-09-07 LAB — CBC WITH DIFFERENTIAL (CANCER CENTER ONLY)
Abs Immature Granulocytes: 0.02 10*3/uL (ref 0.00–0.07)
Basophils Absolute: 0.1 10*3/uL (ref 0.0–0.1)
Basophils Relative: 1 %
Eosinophils Absolute: 0.6 10*3/uL — ABNORMAL HIGH (ref 0.0–0.5)
Eosinophils Relative: 7 %
HCT: 43.8 % (ref 39.0–52.0)
Hemoglobin: 15.4 g/dL (ref 13.0–17.0)
Immature Granulocytes: 0 %
Lymphocytes Relative: 17 %
Lymphs Abs: 1.5 10*3/uL (ref 0.7–4.0)
MCH: 29.1 pg (ref 26.0–34.0)
MCHC: 35.2 g/dL (ref 30.0–36.0)
MCV: 82.6 fL (ref 80.0–100.0)
Monocytes Absolute: 0.6 10*3/uL (ref 0.1–1.0)
Monocytes Relative: 6 %
Neutro Abs: 5.9 10*3/uL (ref 1.7–7.7)
Neutrophils Relative %: 69 %
Platelet Count: 195 10*3/uL (ref 150–400)
RBC: 5.3 MIL/uL (ref 4.22–5.81)
RDW: 12.7 % (ref 11.5–15.5)
WBC Count: 8.7 10*3/uL (ref 4.0–10.5)
nRBC: 0 % (ref 0.0–0.2)

## 2023-09-07 LAB — TOTAL PROTEIN, URINE DIPSTICK: Protein, ur: 100 mg/dL — AB

## 2023-09-07 MED ORDER — LAMOTRIGINE 150 MG PO TABS: 150.0000 mg | ORAL_TABLET | Freq: Two times a day (BID) | ORAL | 2 refills | Status: DC

## 2023-09-07 MED ORDER — GADOBUTROL 1 MMOL/ML IV SOLN
6.0000 mL | Freq: Once | INTRAVENOUS | Status: AC | PRN
  Administered 2023-09-07: 6 mL via INTRAVENOUS

## 2023-09-07 NOTE — Progress Notes (Signed)
 [facility] at [facility] [address]  [place], [place] [address] [phone]  Interval Evaluation  Date of Service: 09/07/23 Patient Name: Alan Wise Patient MRN: [id number] Patient DOB: [birth year] Provider: Henreitta Leber, MD  Identifying Statement:  Alan Wise is a 53 y.o. 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 Maisie Fus; path demonstrates IDH-1 mutant oligodendroglioma   03/20/2021 - 04/30/2021 Radiation Therapy   Completes 54 Gy IMRT and Temodar with Dr. Basilio Cairo   06/02/2021 - 03/05/2022 Chemotherapy   Completes 8 cycles of adjuvant 5-day Temozolomide   02/01/2022 Progression   Progression of disease in multifocal/metastatic/lmd pattern   03/11/2022 Progression   Progression of disease confirmed after additional cycle of TMZ, 1 month follow up MRI.  LP/cytology and MRI spine mets screening unremarkable   04/10/2022 Surgery   Stereotactic biopsy of progressive R frontal lesion at [facility] with Dr. Zachery Conch.  Path demonstrates radiation necrosis   12/03/2022 Surgery   Repeat biopsy with Dr. Zachery Conch, R frontal.  Path again demonstrates necrosis and treatment effect   03/12/2023 -  Chemotherapy   Patient is on Treatment Plan : BRAIN GBM Bevacizumab 14d x 6 cycles       Biomarkers:   Interval History: Alan Wise presents today for cycle #8 of avastin.  Wife describes episode of sudden onset left facial drooping and slurred speech, onset was last Thursday while in [place].  No witnessed seizure activity, though the day before he had fallen off of a step ladder and hit his head.  Symptoms have improved at present, but not quite back to prior baseline.  Cognition continues to decline, even compared to last visit.  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  Wife still has to supervise his emails to  correct frequent errors.  He plans to retire at the end of the semester. Headaches remain improved since starting propanolol   H+P (12/31/20) Patient presented to medical attention this past month, with complaint of involuntary left sided shaking.  He describes episodes, occurring several times per week, of left arm shaking, lasting up to 5 minutes; following the episodes he describes the arm as "heavy" for some time before returning to normal.  He also describes episodes of sudden inability to speak, with strange movements of face and hands, lasting same amount of time.  These episodes go back ~10 years in very sporadic nature, but have become much more frequent in recent months.  He works full time as a Agricultural consultant at [employer].  These episodes have occurred at work, and have been disruptive in that environment.  He is otherwise fully functional, independent.  Medications: Current Outpatient Medications on File Prior to Visit  Medication Sig Dispense Refill   amLODipine (NORVASC) 5 MG tablet Take 1 tablet (5 mg total) by mouth daily. 30 tablet 2   cholecalciferol (VITAMIN D3) 25 MCG (1000 UNIT) tablet Take 1 tablet (1,000 Units total) by mouth daily. 30 tablet 2   diazePAM, 15 MG Dose, (VALTOCO 15 MG DOSE) 2 x 7.5 MG/0.1ML LQPK Place 15 mg into the nose once as needed for up to 1 dose (seizure). 2 each 0   lamoTRIgine (LAMICTAL) 100 MG tablet Take 1 tablet (100 mg total) by mouth 2 (two) times daily. 60 tablet 2   propranolol (INDERAL) 20 MG tablet TAKE 1 TABLET BY MOUTH TWICE A  DAY 60 tablet 1   No current facility-administered medications on file prior to visit.    Allergies:  Allergies  Allergen Reactions   Contrast Media [Iodinated Contrast Media] Swelling   Past Medical History:  Past Medical History:  Diagnosis Date   Allergy    Complication of anesthesia    pt reports he is starting to have more difficulty arousing after surgery   Diabetes mellitus (HCC)    GERD  (gastroesophageal reflux disease)    Headache    History of kidney stones    Hyperlipidemia    Renal disorder    kidney stones   Past Surgical History:  Past Surgical History:  Procedure Laterality Date   APPLICATION OF CRANIAL NAVIGATION Right 01/17/2021   Procedure: APPLICATION OF CRANIAL NAVIGATION;  Surgeon: Bedelia Person, MD;  [facility] OR;  Service: Neurosurgery;  Laterality: Right;   COLONOSCOPY  09/03/1994   COLONOSCOPY WITH PROPOFOL N/A 01/07/2023   Procedure: COLONOSCOPY WITH PROPOFOL;  Surgeon: [name], MD;  Location: [facility] ENDOSCOPY;  Service: Endoscopy;  Laterality: N/A;  NOT TOO EARLY   CYSTOSCOPY WITH STENT PLACEMENT Right 01/25/2016   Procedure: CYSTOSCOPY WITH STENT PLACEMENT;  Surgeon: Hildred Laser, MD;  Location: [facility] ORS;  Service: Urology;  Laterality: Right;   FRAMELESS  BIOPSY WITH BRAINLAB Right 01/17/2021   Procedure: RIGHT STEREOTACTIC BIOPSY OF INSULAR LESION;  Surgeon: Bedelia Person, MD;  Location: [facility] OR;  Service: Neurosurgery;  Laterality: Right;   KNEE ARTHROSCOPY W/ MENISCAL REPAIR Right 06/23/1988   POLYPECTOMY  01/07/2023   Procedure: POLYPECTOMY;  Surgeon: [name], MD;  Location: [facility] ENDOSCOPY;  Service: Endoscopy;;   removal of birthmark  06/08/2013   SKIN CANCER EXCISION  06/23/2012   TYMPANOSTOMY TUBE PLACEMENT  08/06/1978   URETEROSCOPY WITH HOLMIUM LASER LITHOTRIPSY Right 01/25/2016   Procedure: URETEROSCOPY WITH HOLMIUM LASER LITHOTRIPSY;  Surgeon: Hildred Laser, MD;  Location: [facility] ORS;  Service: Urology;  Laterality: Right;   URETHRAL STRICTURE DILATATION     visual inspection of vocal cord     1973   WISDOM TOOTH EXTRACTION     Social History:  Social History   Socioeconomic History   Marital status: Married    Spouse name: Not on file   Number of children: Not on file   Years of education: Not on file   Highest education level: Not on file  Occupational History   Not on file  Tobacco Use   Smoking  status: Never   Smokeless tobacco: Never  Vaping Use   Vaping status: Never Used  Substance and Sexual Activity   Alcohol use: Not Currently    Comment: rare   Drug use: No   Sexual activity: Not on file  Other Topics Concern   Not on file  Social History Narrative   Not on file   Social Drivers of Health   Financial Resource Strain: Low Risk  (12/05/2022)   Received from [facility]   Overall Financial Resource Strain (CARDIA)    Difficulty of Paying Living Expenses: Not hard at all  Food Insecurity: No Food Insecurity (05/26/2023)   Hunger Vital Sign    Worried About Running Out of Food in the Last Year: Never true    Ran Out of Food in the Last Year: Never true  Transportation Needs: No Transportation Needs (05/26/2023)   PRAPARE - Administrator, Civil Service (Medical): No    Lack of Transportation (Non-Medical):  No  Physical Activity: Not on file  Stress: Not on file  Social Connections: Not on file  Intimate Partner Violence: Not At Risk (05/26/2023)   Humiliation, Afraid, Rape, and Kick questionnaire    Fear of Current or Ex-Partner: No    Emotionally Abused: No    Physically Abused: No    Sexually Abused: No   Family History:  Family History  Problem Relation Age of Onset   Hyperlipidemia Father    Hypertension Father    Diabetes Father    Benign prostatic hyperplasia Father    Stroke Maternal Grandmother    Diabetes Maternal Grandmother    Stroke Paternal Grandmother    Hypertension Paternal Grandmother    Kidney disease Neg Hx    Prostate cancer Neg Hx     Review of Systems: Constitutional: Doesn't report fevers, chills or abnormal weight loss Eyes: Doesn't report blurriness of vision Ears, nose, mouth, throat, and face: Doesn't report sore throat Respiratory: Doesn't report cough, dyspnea or wheezes Cardiovascular: Doesn't report palpitation, chest discomfort  Gastrointestinal:  Doesn't  report nausea, constipation, diarrhea GU: Doesn't report incontinence Skin: Doesn't report skin rashes Neurological: Per HPI Musculoskeletal: Doesn't report joint pain Behavioral/Psych: Doesn't report anxiety  Physical Exam: Vitals:   09/07/23 1251  BP: (!) 149/84  Pulse: (!) 57  Resp: 18  Temp: 97.6 F (36.4 C)  SpO2: 99%   KPS: 70. General: Fatigued Head: Normal EENT: No conjunctival injection or scleral icterus.  Lungs: Resp effort normal Cardiac: Regular rate Abdomen: Non-distended abdomen Skin: No rashes cyanosis or petechiae. Extremities: No clubbing or edema  Neurologic Exam: Mental Status: Awake, alert, attentive to examiner. Oriented to self and environment.  Language is notable for impaired fluency, with additional impairments in comprehension.  Modest psychomotor slowing, impaired 3 object recall Cranial Nerves: Visual acuity is grossly normal. Right field hemianopia. Extra-ocular movements intact. No ptosis. Mild UMN right facial paresis. Motor: Tone and bulk are normal. Power is full in both arms and legs. Reflexes are symmetric, no pathologic reflexes present.  Sensory: Intact to light touch Gait: Normal.   Labs: I have reviewed the data as listed    Component Value Date/Time   NA 137 08/19/2023 0818   NA 138 05/04/2012 1815   K 4.3 08/19/2023 0818   K 3.5 05/04/2012 1815   CL 96 (L) 08/19/2023 0818   CL 99 05/04/2012 1815   CO2 33 (H) 08/19/2023 0818   CO2 29 05/04/2012 1815   GLUCOSE 184 (H) 08/19/2023 0818   GLUCOSE 69 05/04/2012 1815   BUN 14 08/19/2023 0818   BUN 12 05/04/2012 1815   CREATININE 0.95 08/19/2023 0818   CALCIUM 9.8 08/19/2023 0818   CALCIUM 9.2 05/04/2012 1815   PROT 6.9 08/19/2023 0818   PROT 8.5 (H) 05/04/2012 1815   ALBUMIN 4.3 05/22/2023 0936   ALBUMIN 4.5 05/04/2012 1815   AST 21 08/19/2023 0818   AST 15 05/22/2023 0936   ALT 33 08/19/2023 0818   ALT 16 05/22/2023 0936   ALT 77 05/04/2012 1815   ALKPHOS 95 05/22/2023  0936   ALKPHOS 129 05/04/2012 1815   BILITOT 0.5 08/19/2023 0818   BILITOT 0.5 05/22/2023 0936   GFRNONAA >60 05/22/2023 0936   GFRNONAA 106 12/05/2020 1014   GFRAA 123 12/05/2020 1014   Lab Results  Component Value Date   WBC 7.2 08/10/2023   NEUTROABS 4.8 08/10/2023   HGB 15.3 08/10/2023   HCT 44.3 08/10/2023   MCV 83.0 08/10/2023   PLT  208 08/10/2023     Pathology report from June 2024 biopsy: A-B. Brain tissue, right frontal lesion, biopsy:   Brain tissue with changes consistent with treatment effect; see comment.       Comment: There is no definitive morphologic, immunophenotypic, or molecular evidence of the patient's known oligodendroglioma, IDH-mutant and 1p/19q-codeleted; see comment. The specimen is negative for mutations in the TERT promoter and in IDH1/2 by Sanger sequencing.       Reviewed by resident/fellow Bernadette Hoit, GREGORY  Electronically signed by Ulanda Edison, MD on 12/23/2022     Assessment/Plan Oligodendroglioma [facility])  Focal seizures (HCC)  Radiation therapy induced brain necrosis  Alan Wise presents today with new focal deficits, localizing to right frontal lobe, subcortex or brainstem.  Etiology is likely either stroke or post-ictal encephalopathy from unwitnessed or subclinical seizure.  He was told to seek emergency care last week but declined to do so.  Given risk of bleeding or clotting event with avastin, will NOT proceed with the infusion today.  Will need updated imaging to assess these new deficits, ordered STAT MRI brain.  Lacmital will be increased to 150mg  BID.  Cognitive and language impairments are again noted today.    Avastin should be held for the following:  ANC less than 500  Platelets less than 50,000  LFT or creatinine greater than 2x ULN  If clinical concerns/contraindications develop  Con't Propanolol 20mg  BID which may help with headache prevention as well as blood pressure.   Amlodipine 5mg  daily will be continued as well.  We ask that Alan Wise be ready to hear from [place] regarding his MRI results, treatment planning moving forward.  All questions were answered. The patient knows to call the clinic with any problems, questions or concerns. No barriers to learning were detected.  The total time spent in the encounter was 40 minutes and more than 50% was on counseling and review of test results   Henreitta Leber, MD Medical Director of Neuro-Oncology [facility] at [place] [facility] 09/07/23 12:49 PM

## 2023-09-08 ENCOUNTER — Ambulatory Visit: Payer: 59 | Admitting: Speech Pathology

## 2023-09-08 ENCOUNTER — Inpatient Hospital Stay: Admitting: Internal Medicine

## 2023-09-08 DIAGNOSIS — Y842 Radiological procedure and radiotherapy as the cause of abnormal reaction of the patient, or of later complication, without mention of misadventure at the time of the procedure: Secondary | ICD-10-CM

## 2023-09-08 DIAGNOSIS — I6789 Other cerebrovascular disease: Secondary | ICD-10-CM

## 2023-09-08 DIAGNOSIS — C719 Malignant neoplasm of brain, unspecified: Secondary | ICD-10-CM | POA: Diagnosis not present

## 2023-09-08 DIAGNOSIS — I639 Cerebral infarction, unspecified: Secondary | ICD-10-CM

## 2023-09-08 MED ORDER — ATORVASTATIN CALCIUM 40 MG PO TABS: 40.0000 mg | ORAL_TABLET | Freq: Every day | ORAL | 3 refills | Status: DC

## 2023-09-08 MED ORDER — ASPIRIN 81 MG PO TBEC: 81.0000 mg | DELAYED_RELEASE_TABLET | Freq: Every day | ORAL | 12 refills | Status: DC

## 2023-09-08 NOTE — Progress Notes (Signed)
 I connected with Alan Wise on 09/08/23 at  9:30 AM EDT by telephone visit and verified that I am speaking with the correct person using two identifiers.  I discussed the limitations, risks, security and privacy concerns of performing an evaluation and management service by telemedicine and the availability of in-person appointments. I also discussed with the patient that there may be a patient responsible charge related to this service. The patient expressed understanding and agreed to proceed.   Other persons participating in the visit and their role in the encounter:  spouse  Patient's location:  Home Provider's location:  Office Chief Complaint:  Cerebrovascular accident (CVA), unspecified mechanism (HCC) - Plan: ECHOCARDIOGRAM COMPLETE  History of Present Ilness: Alan Wise reports no clinical changes today.  No worsening of facial weakness, slurred speech described as occurring suddenly last week.  No significant issues obtaining MRI yesterday.  Observations: Language and cognition at baseline  Imaging:   MR BRAIN W WO CONTRAST Result Date: 09/07/2023 CLINICAL DATA:  Provided history: Oligodendroglioma. Brain/CNS neoplasm, assess treatment response. Additional history provided by the scanning technologist: Multiple falls, vision loss in left eye, aphasia. Avastin therapy. Restaging. EXAM: MRI HEAD WITHOUT AND WITH CONTRAST TECHNIQUE: Multiplanar, multiecho pulse sequences of the brain and surrounding structures were obtained without and with intravenous contrast. CONTRAST:  6mL GADAVIST GADOBUTROL 1 MMOL/ML IV SOLN COMPARISON:  Brain MRI 07/06/2023. FINDINGS: Brain: Mild generalized cerebral atrophy. 9 mm focus of restricted diffusion with associated T2 FLAIR hyperintense signal abnormality in the right cerebral peduncle (series 5, image 73). 5 mm focus of restricted diffusion with associated T2 FLAIR hyperintense signal abnormality in the anterior left temporal lobe (series 5,  image 74). 3 mm focus of restricted diffusion within the callosal genu at midline (series 5, image 81). 5 mm focus of restricted diffusion within the right insula with associated T2 FLAIR hyperintense signal abnormality. These foci are new as compared to the brain MRI of 07/06/2023 and may reflect acute/early subacute infarcts. However, new sites of tumor cannot be excluded. There is a new 3 mm focus of enhancement in the right subinsular region (series 16, image 90). No other new or progressive foci of enhancement are identified. An irregular focus of restricted diffusion within the right retrolenticular/periatrial region has mildly increased in extent since the prior MRI. A region of restricted diffusion within the mid and anterior right frontal lobe (along the margins of the right lateral ventricle) has also slightly increased in extent (for instance, extending more medially within the body of the corpus callosum on series 10, image 25). T2 FLAIR hyperintense signal abnormality within the right cerebral hemisphere has not significantly changed, nor has T2 FLAIR hyperintense signal abnormality within the body/genu of corpus callosum and left frontal lobe periventricular white matter. Unchanged parenchymal swelling at the right insula. Superimposed foci of chronic blood products within the right frontal lobe have increased in extent. Redemonstrated focus of cystic encephalomalacia at the anterior right insula. Chronic lacunar infarcts again demonstrated within the left aspect of the pons. Mild multifocal T2 FLAIR hyperintense signal abnormality elsewhere within the cerebral white matter and pons, nonspecific but compatible chronic small vessel ischemic disease. No extra-axial fluid collection. No midline shift. Vascular: Maintained flow voids within the proximal large arterial vessels. Skull and upper cervical spine: No focal worrisome marrow lesion. Right frontoparietal burr hole. Sinuses/Orbits: No focal  worrisome marrow lesion. Incompletely assessed cervical spondylosis. Other: No mass or acute finding within the imaged orbits. Mild mucosal thickening within the  left ethmoid and left maxillary sinuses. MRi brain impression #1 called by telephone at the time of interpretation on 09/07/2023 at 7:28 pm to provider [facility] Shamir Sedlar , who verbally acknowledged these results. IMPRESSION: 1. Foci of restricted diffusion and T2 FLAIR hyperintense signal abnormality within the right cerebral peduncle, anterior left temporal lobe, callosal genu and right insula, as described and new from the prior brain MRI of 07/06/2023. These foci may reflect acute/early subacute infarcts. However, new sites of tumor cannot be excluded. A short-interval follow-up brain MRI is recommended. 2. Foci of restricted diffusion within the right retrolenticular/periatrial region and right frontal lobe have slightly increased in extent since the prior MRI. Additionally, there is a new 3 mm right subinsular focus of parenchymal enhancement. These findings may reflect evolving treatment changes. However, a short-interval follow-up brain MRI is recommended for close surveillance. 3. Pre-contrast T1 hyperintense chronic blood products within the right frontal lobe have increased in extent. 4. Otherwise no significant interval change. 5. 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 Electronically Signed   By: Jackey Loge D.O.   On: 09/07/2023 20:07    Assessment and Plan: Cerebrovascular accident (CVA), unspecified mechanism (HCC) - Plan: ECHOCARDIOGRAM COMPLETE  Discussed MRI results, particularly abnormalities on DWI.  This is most likely stroke/vascular, although avastin treatment effect/artifact is also possible, as is early recurrence of tumor or radiation necrosis.  He was advised to go to the ED from radiology suite yesterday, but declined.  Therefore recommended the  following: -Hold avastin indefinitely -Start ASA 81mg  daily -Start atorvastatin 40mg  daily -Transthoracic echocardiogram -Repeat MRI with MRA in 1 month  Understands to seek emergency care with any new or suddenly progressive neurologic symptoms.  Follow Up Instructions: We ask that Alan Wise return to clinic in 1 months following next brain MRI, or sooner as needed.  I discussed the assessment and treatment plan with the patient.  The patient was provided an opportunity to ask questions and all were answered.  The patient agreed with the plan and demonstrated understanding of the instructions.    The patient was advised to call back or seek an in-person evaluation if the symptoms worsen or if the condition fails to improve as anticipated.    Alan Leber, MD   I provided 20 minutes of non face-to-face telephone visit time during this encounter, and > 50% was spent counseling as documented under my assessment & plan.

## 2023-09-09 ENCOUNTER — Observation Stay

## 2023-09-09 ENCOUNTER — Observation Stay
Admission: EM | Admit: 2023-09-09 | Discharge: 2023-09-11 | Disposition: A | Discharge status: Home / Self Care | Attending: Internal Medicine | Admitting: Internal Medicine

## 2023-09-09 ENCOUNTER — Other Ambulatory Visit: Payer: Self-pay

## 2023-09-09 ENCOUNTER — Ambulatory Visit: Admitting: Speech Pathology

## 2023-09-09 ENCOUNTER — Emergency Department

## 2023-09-09 DIAGNOSIS — I629 Nontraumatic intracranial hemorrhage, unspecified: Secondary | ICD-10-CM

## 2023-09-09 DIAGNOSIS — G40909 Epilepsy, unspecified, not intractable, without status epilepticus: Secondary | ICD-10-CM

## 2023-09-09 DIAGNOSIS — G40901 Epilepsy, unspecified, not intractable, with status epilepticus: Secondary | ICD-10-CM | POA: Insufficient documentation

## 2023-09-09 DIAGNOSIS — I639 Cerebral infarction, unspecified: Principal | ICD-10-CM | POA: Diagnosis present

## 2023-09-09 DIAGNOSIS — Z8673 Personal history of transient ischemic attack (TIA), and cerebral infarction without residual deficits: Secondary | ICD-10-CM | POA: Diagnosis present

## 2023-09-09 DIAGNOSIS — R569 Unspecified convulsions: Secondary | ICD-10-CM

## 2023-09-09 DIAGNOSIS — R059 Cough, unspecified: Secondary | ICD-10-CM | POA: Insufficient documentation

## 2023-09-09 DIAGNOSIS — E1165 Type 2 diabetes mellitus with hyperglycemia: Secondary | ICD-10-CM | POA: Insufficient documentation

## 2023-09-09 DIAGNOSIS — E11649 Type 2 diabetes mellitus with hypoglycemia without coma: Secondary | ICD-10-CM | POA: Insufficient documentation

## 2023-09-09 DIAGNOSIS — Z7982 Long term (current) use of aspirin: Secondary | ICD-10-CM | POA: Insufficient documentation

## 2023-09-09 DIAGNOSIS — E785 Hyperlipidemia, unspecified: Secondary | ICD-10-CM | POA: Insufficient documentation

## 2023-09-09 DIAGNOSIS — Z794 Long term (current) use of insulin: Secondary | ICD-10-CM

## 2023-09-09 DIAGNOSIS — Z79899 Other long term (current) drug therapy: Secondary | ICD-10-CM | POA: Insufficient documentation

## 2023-09-09 DIAGNOSIS — I10 Essential (primary) hypertension: Secondary | ICD-10-CM | POA: Insufficient documentation

## 2023-09-09 DIAGNOSIS — E1169 Type 2 diabetes mellitus with other specified complication: Secondary | ICD-10-CM | POA: Diagnosis present

## 2023-09-09 DIAGNOSIS — C719 Malignant neoplasm of brain, unspecified: Secondary | ICD-10-CM | POA: Diagnosis present

## 2023-09-09 DIAGNOSIS — C710 Malignant neoplasm of cerebrum, except lobes and ventricles: Secondary | ICD-10-CM

## 2023-09-09 LAB — TROPONIN I (HIGH SENSITIVITY)
Troponin I (High Sensitivity): 12 ng/L (ref <18)
Troponin I (High Sensitivity): 12 ng/L (ref <18)

## 2023-09-09 LAB — CBC
HCT: 44.3 % (ref 39.0–52.0)
Hemoglobin: 15.8 g/dL (ref 13.0–17.0)
MCH: 29.6 pg (ref 26.0–34.0)
MCHC: 35.7 g/dL (ref 30.0–36.0)
MCV: 83 fL (ref 80.0–100.0)
Platelets: 210 10*3/uL (ref 150–400)
RBC: 5.34 MIL/uL (ref 4.22–5.81)
RDW: 12.7 % (ref 11.5–15.5)
WBC: 7.1 10*3/uL (ref 4.0–10.5)
nRBC: 0 % (ref 0.0–0.2)

## 2023-09-09 LAB — BASIC METABOLIC PANEL
Anion gap: 8 (ref 5–15)
BUN: 15 mg/dL (ref 6–20)
CO2: 27 mmol/L (ref 22–32)
Calcium: 9.3 mg/dL (ref 8.9–10.3)
Chloride: 103 mmol/L (ref 98–111)
Creatinine, Ser: 0.95 mg/dL (ref 0.61–1.24)
GFR, Estimated: >60 mL/min (ref >60)
Glucose, Bld: 175 mg/dL — ABNORMAL HIGH (ref 70–99)
Potassium: 4.2 mmol/L (ref 3.5–5.1)
Sodium: 138 mmol/L (ref 135–145)

## 2023-09-09 LAB — CBG MONITORING, ED
Glucose-Capillary: 158 mg/dL — ABNORMAL HIGH (ref 70–99)
Glucose-Capillary: 103 mg/dL — ABNORMAL HIGH (ref 70–99)
Glucose-Capillary: 67 mg/dL — ABNORMAL LOW (ref 70–99)
Glucose-Capillary: 205 mg/dL — ABNORMAL HIGH (ref 70–99)

## 2023-09-09 MED ORDER — STROKE: EARLY STAGES OF RECOVERY BOOK: Freq: Once | Status: AC

## 2023-09-09 MED ORDER — INSULIN ASPART 100 UNIT/ML IJ SOLN
0.0000 [IU] | Freq: Three times a day (TID) | INTRAMUSCULAR | Status: DC
  Administered 2023-09-10: 3 [IU] via SUBCUTANEOUS
  Filled 2023-09-09: qty 1

## 2023-09-09 MED ORDER — GADOBUTROL 1 MMOL/ML IV SOLN
6.0000 mL | Freq: Once | INTRAVENOUS | Status: AC | PRN
  Administered 2023-09-09: 6 mL via INTRAVENOUS

## 2023-09-09 MED ORDER — DEXTROSE 50 % IV SOLN
1.0000 | Freq: Once | INTRAVENOUS | Status: AC
  Administered 2023-09-09: 50 mL via INTRAVENOUS
  Filled 2023-09-09: qty 50

## 2023-09-09 MED ORDER — LAMOTRIGINE 100 MG PO TABS
150.0000 mg | ORAL_TABLET | Freq: Two times a day (BID) | ORAL | Status: DC
  Administered 2023-09-10 – 2023-09-11 (×3): 150 mg via ORAL
  Filled 2023-09-09 (×5): qty 2

## 2023-09-09 MED ORDER — HYDRALAZINE HCL 20 MG/ML IJ SOLN: 5.0000 mg | Freq: Four times a day (QID) | INTRAMUSCULAR | Status: DC | PRN

## 2023-09-09 MED ORDER — ASPIRIN 81 MG PO TBEC
81.0000 mg | DELAYED_RELEASE_TABLET | Freq: Every day | ORAL | Status: DC
  Administered 2023-09-10 – 2023-09-11 (×2): 81 mg via ORAL
  Filled 2023-09-09 (×3): qty 1

## 2023-09-09 MED ORDER — VITAMIN D 25 MCG (1000 UNIT) PO TABS
1000.0000 [IU] | ORAL_TABLET | Freq: Every day | ORAL | Status: DC
  Administered 2023-09-10 – 2023-09-11 (×2): 1000 [IU] via ORAL
  Filled 2023-09-09 (×2): qty 1

## 2023-09-09 MED ORDER — ACETAMINOPHEN 160 MG/5ML PO SOLN: 650.0000 mg | ORAL | Status: DC | PRN

## 2023-09-09 MED ORDER — SODIUM CHLORIDE 0.9% FLUSH
3.0000 mL | Freq: Two times a day (BID) | INTRAVENOUS | Status: DC
  Administered 2023-09-09 – 2023-09-10 (×4): 10 mL via INTRAVENOUS

## 2023-09-09 MED ORDER — INSULIN ASPART 100 UNIT/ML IJ SOLN: 0.0000 [IU] | Freq: Every day | INTRAMUSCULAR | Status: DC

## 2023-09-09 MED ORDER — LORAZEPAM 2 MG/ML IJ SOLN
2.0000 mg | INTRAMUSCULAR | Status: DC | PRN
  Administered 2023-09-09: 2 mg via INTRAVENOUS
  Filled 2023-09-09: qty 1

## 2023-09-09 MED ORDER — ACETAMINOPHEN 650 MG RE SUPP: 650.0000 mg | RECTAL | Status: DC | PRN

## 2023-09-09 MED ORDER — SENNOSIDES-DOCUSATE SODIUM 8.6-50 MG PO TABS: 1.0000 | ORAL_TABLET | Freq: Every evening | ORAL | Status: DC | PRN

## 2023-09-09 MED ORDER — PROPRANOLOL HCL 20 MG PO TABS
20.0000 mg | ORAL_TABLET | Freq: Two times a day (BID) | ORAL | Status: DC
  Administered 2023-09-10 – 2023-09-11 (×3): 20 mg via ORAL
  Filled 2023-09-09 (×3): qty 1

## 2023-09-09 MED ORDER — ATORVASTATIN CALCIUM 20 MG PO TABS
40.0000 mg | ORAL_TABLET | Freq: Every day | ORAL | Status: DC
  Administered 2023-09-09 – 2023-09-10 (×2): 40 mg via ORAL
  Filled 2023-09-09 (×2): qty 2

## 2023-09-09 MED ORDER — SODIUM CHLORIDE 0.9% FLUSH: 3.0000 mL | INTRAVENOUS | Status: DC | PRN

## 2023-09-09 MED ORDER — LEVETIRACETAM IN NACL 500 MG/100ML IV SOLN: 500.0000 mg | Freq: Two times a day (BID) | INTRAVENOUS | Status: DC

## 2023-09-09 MED ORDER — LORAZEPAM 2 MG/ML IJ SOLN: 2.0000 mg | Freq: Once | INTRAMUSCULAR | Status: DC

## 2023-09-09 MED ORDER — ACETAMINOPHEN 325 MG PO TABS: 650.0000 mg | ORAL_TABLET | ORAL | Status: DC | PRN

## 2023-09-09 MED ORDER — LAMOTRIGINE 25 MG PO TABS
150.0000 mg | ORAL_TABLET | Freq: Once | ORAL | Status: AC
  Administered 2023-09-09: 150 mg via ORAL
  Filled 2023-09-09: qty 2

## 2023-09-09 MED ORDER — AMLODIPINE BESYLATE 5 MG PO TABS
5.0000 mg | ORAL_TABLET | Freq: Every day | ORAL | Status: DC
  Administered 2023-09-10 – 2023-09-11 (×2): 5 mg via ORAL
  Filled 2023-09-09 (×2): qty 1

## 2023-09-09 NOTE — ED Notes (Signed)
 Speaking Mansy, MD on phone and collecting BG.

## 2023-09-09 NOTE — ED Notes (Signed)
 Pt Back from CT. Resting comfortable. Blankets provided. Fall risk band on, bed in lowest position, shoes on and Posey monitor in place.

## 2023-09-09 NOTE — Consult Note (Signed)
 NEUROLOGY CONSULT NOTE   Date of service: September 09, 2023 Patient Name: Alan Wise MRN:  [id number] DOB:  1971/03/02 Chief Complaint: "Facial weakness" Requesting Provider: Lovenia Kim, DO  History of Present Illness  Alan Wise is a 53 y.o. male with hx of oligodendroglioma followed by Dr. Barbaraann Cao who is currently undergoing treatment with Avastin.  He was in his normal state of health when he went up to stay with his parents last week, and then on Thursday apparently developed left facial weakness as well as slurred speech.  For this an MRI was obtained yesterday which showed multiple areas of restricted diffusion concerning for acute ischemia and therefore the patient was referred to the emergency department for further workup.  Wife is also concerned that he may have had a partial seizure this morning given that he had a fatigued episode similar to his postictal periods.  He did miss a dose of his lamotrigine this morning  LKW: Thursday Modified rankin score: 3-Moderate disability-requires help but walks WITHOUT assistance IV Thrombolysis: No, outside of window EVT: No, outside of window  NIHSS components Score: Comment  1a Level of Conscious 0[x]  1[]  2[]  3[]      1b LOC Questions 0[]  1[x]  2[]       1c LOC Commands 0[x]  1[]  2[]       2 Best Gaze 0[x]  1[]  2[]       3 Visual 0[]  1[]  2[x]  3[]      4 Facial Palsy 0[]  1[]  2[]  3[]      5a Motor Arm - left 0[]  1[]  2[]  3[]  4[]  UN[]    5b Motor Arm - Right 0[]  1[]  2[]  3[]  4[]  UN[]    6a Motor Leg - Left 0[]  1[]  2[]  3[]  4[]  UN[]    6b Motor Leg - Right 0[]  1[]  2[]  3[]  4[]  UN[]    7 Limb Ataxia 0[]  1[]  2[]  3[]  UN[]     8 Sensory 0[]  1[]  2[]  UN[]      9 Best Language 0[]  1[]  2[]  3[]      10 Dysarthria 0[]  1[]  2[]  UN[]      11 Extinct. and Inattention 0[]  1[]  2[]       TOTAL:      Past History   Past Medical History:  Diagnosis Date   Allergy    Complication of anesthesia    pt reports he is starting to have more difficulty arousing  after surgery   Diabetes mellitus (HCC)    GERD (gastroesophageal reflux disease)    Headache    History of kidney stones    Hyperlipidemia    Renal disorder    kidney stones    Past Surgical History:  Procedure Laterality Date   APPLICATION OF CRANIAL NAVIGATION Right 01/17/2021   Procedure: APPLICATION OF CRANIAL NAVIGATION;  Surgeon: Bedelia Person, MD;  [facility] OR;  Service: Neurosurgery;  Laterality: Right;   COLONOSCOPY  09/03/1994   COLONOSCOPY WITH PROPOFOL N/A 01/07/2023   Procedure: COLONOSCOPY WITH PROPOFOL;  Surgeon: [name], MD;  Location: [facility] ENDOSCOPY;  Service: Endoscopy;  Laterality: N/A;  NOT TOO EARLY   CYSTOSCOPY WITH STENT PLACEMENT Right 01/25/2016   Procedure: CYSTOSCOPY WITH STENT PLACEMENT;  Surgeon: Hildred Laser, MD;  Location: [facility] ORS;  Service: Urology;  Laterality: Right;   FRAMELESS  BIOPSY WITH BRAINLAB Right 01/17/2021   Procedure: RIGHT STEREOTACTIC BIOPSY OF INSULAR LESION;  Surgeon: Bedelia Person, MD;  Location: [facility] OR;  Service: Neurosurgery;  Laterality: Right;   KNEE ARTHROSCOPY W/ MENISCAL REPAIR Right 06/23/1988   POLYPECTOMY  01/07/2023   Procedure: POLYPECTOMY;  Surgeon: [name], MD;  Location: [facility] ENDOSCOPY;  Service: Endoscopy;;   removal of birthmark  06/08/2013   SKIN CANCER EXCISION  06/23/2012   TYMPANOSTOMY TUBE PLACEMENT  08/06/1978   URETEROSCOPY WITH HOLMIUM LASER LITHOTRIPSY Right 01/25/2016   Procedure: URETEROSCOPY WITH HOLMIUM LASER LITHOTRIPSY;  Surgeon: Hildred Laser, MD;  Location: [facility] ORS;  Service: Urology;  Laterality: Right;   URETHRAL STRICTURE DILATATION     visual inspection of vocal cord     1973   WISDOM TOOTH EXTRACTION      Family History: Family History  Problem Relation Age of Onset   Hyperlipidemia Father    Hypertension Father    Diabetes Father    Benign prostatic hyperplasia Father    Stroke Maternal Grandmother    Diabetes Maternal Grandmother    Stroke Paternal  Grandmother    Hypertension Paternal Grandmother    Kidney disease Neg Hx    Prostate cancer Neg Hx     Social History  reports that he has never smoked. He has never used smokeless tobacco. He reports that he does not currently use alcohol. He reports that he does not use drugs.  Allergies  Allergen Reactions   Contrast Media [Iodinated Contrast Media] Swelling    Medications   Current Facility-Administered Medications:     stroke: early stages of recovery book, , Does not apply, Once, Cox, Amy N, DO   acetaminophen (TYLENOL) tablet 650 mg, 650 mg, Oral, Q4H PRN **OR** acetaminophen (TYLENOL) 160 MG/5ML solution 650 mg, 650 mg, Per Tube, Q4H PRN **OR** acetaminophen (TYLENOL) suppository 650 mg, 650 mg, Rectal, Q4H PRN, Cox, Amy N, DO   [START ON 09/10/2023] amLODipine (NORVASC) tablet 5 mg, 5 mg, Oral, Daily, Cox, Amy N, DO   aspirin EC tablet 81 mg, 81 mg, Oral, Daily, Cox, Amy N, DO   atorvastatin (LIPITOR) tablet 40 mg, 40 mg, Oral, Q2000, Cox, Amy N, DO   [START ON 09/10/2023] cholecalciferol (VITAMIN D3) 25 MCG (1000 UNIT) tablet 1,000 Units, 1,000 Units, Oral, Daily, Cox, Amy N, DO   hydrALAZINE (APRESOLINE) injection 5 mg, 5 mg, Intravenous, Q6H PRN, Cox, Amy N, DO   insulin aspart (novoLOG) injection 0-15 Units, 0-15 Units, Subcutaneous, TID WC, Cox, Amy N, DO   insulin aspart (novoLOG) injection 0-5 Units, 0-5 Units, Subcutaneous, QHS, Cox, Amy N, DO   lamoTRIgine (LAMICTAL) tablet 150 mg, 150 mg, Oral, BID, Cox, Amy N, DO   LORazepam (ATIVAN) injection 2 mg, 2 mg, Intravenous, PRN, Cox, Amy N, DO   propranolol (INDERAL) tablet 20 mg, 20 mg, Oral, BID, Cox, Amy N, DO   senna-docusate (Senokot-S) tablet 1 tablet, 1 tablet, Oral, QHS PRN, Cox, Amy N, DO   sodium chloride flush (NS) 0.9 % injection 3-10 mL, 3-10 mL, Intravenous, Q12H, Cox, Amy N, DO, 10 mL at 09/09/23 1607   sodium chloride flush (NS) 0.9 % injection 3-10 mL, 3-10 mL, Intravenous, PRN, Cox, Amy N, DO  Current  Outpatient Medications:    amLODipine (NORVASC) 5 MG tablet, Take 1 tablet (5 mg total) by mouth daily., Disp: 30 tablet, Rfl: 2   atorvastatin (LIPITOR) 40 MG tablet, Take 1 tablet (40 mg total) by mouth daily., Disp: 30 tablet, Rfl: 3   cholecalciferol (VITAMIN D3) 25 MCG (1000 UNIT) tablet, Take 1 tablet (1,000 Units total) by mouth daily., Disp: 30 tablet, Rfl: 2   lamoTRIgine (LAMICTAL) 150 MG tablet, Take 1 tablet (150 mg total) by mouth 2 (  two) times daily., Disp: 60 tablet, Rfl: 2   propranolol (INDERAL) 20 MG tablet, TAKE 1 TABLET BY MOUTH TWICE A DAY, Disp: 60 tablet, Rfl: 1   aspirin EC 81 MG tablet, Take 1 tablet (81 mg total) by mouth daily. Swallow whole., Disp: 30 tablet, Rfl: 12   diazePAM, 15 MG Dose, (VALTOCO 15 MG DOSE) 2 x 7.5 MG/0.1ML LQPK, Place 15 mg into the nose once as needed for up to 1 dose (seizure)., Disp: 2 each, Rfl: 0  Vitals   Vitals:   09/09/23 1233 09/09/23 1645  BP: 136/83   Pulse: 74   Resp: 17   Temp: 98.3 F (36.8 C) 98.3 F (36.8 C)  TempSrc: Oral Oral  SpO2: 98%     There is no height or weight on file to calculate BMI.  Physical Exam   Constitutional: Appears well-developed and well-nourished.   Neurologic Examination    Neuro: Mental Status: Patient is awake, alert, he has a significant expressive aphasia which makes answering orientation questions difficult, he gives the year is 2024, and is able to give me the month is March.  For location, he is able to choose correctly when given multiple choices. Cranial Nerves: II: He has a left hemianopia. Pupils are equal, round, and reactive to light.   III,IV, VI: EOMI without ptosis or diploplia.  V: Facial sensation is symmetric to temperature VII: Facial movement with mild facial weakness VIII: hearing is intact to voice X: Uvula elevates symmetrically XII: tongue is midline without atrophy or fasciculations.  Motor: He has good strength confrontation without  drift Sensory: Symmetric to light touch        Labs/Imaging/Neurodiagnostic studies   CBC:  Recent Labs  Lab October 01, 2023 1218 09/09/23 1240  WBC 8.7 7.1  NEUTROABS 5.9  --   HGB 15.4 15.8  HCT 43.8 44.3  MCV 82.6 83.0  PLT 195 210   Basic Metabolic Panel:  Lab Results  Component Value Date   NA 138 09/09/2023   K 4.2 09/09/2023   CO2 27 09/09/2023   GLUCOSE 175 (H) 09/09/2023   BUN 15 09/09/2023   CREATININE 0.95 09/09/2023   CALCIUM 9.3 09/09/2023   GFRNONAA >60 09/09/2023   GFRAA 123 12/05/2020   Lipid Panel:  Lab Results  Component Value Date   LDLCALC 161 (H) 08/19/2023   HgbA1c:  Lab Results  Component Value Date   HGBA1C 6.3 (H) 08/19/2023   Urine Drug Screen: No results found for: "LABOPIA", "COCAINSCRNUR", "LABBENZ", "AMPHETMU", "THCU", "LABBARB"  Alcohol Level     Component Value Date/Time   ETH <10 05/13/2023 2116   INR  Lab Results  Component Value Date   INR 1.0 05/13/2023   APTT  Lab Results  Component Value Date   APTT 29 05/13/2023   AED levels:  Lab Results  Component Value Date   LAMOTRIGINE 1.8 (L) 05/14/2023    ASSESSMENT   KEONDRE MARKSON is a 53 y.o. male with a history of oligodendroglioma with what appears to be multifocal infarcts on MRI.  He will need further workup including telemetry and echocardiogram.  With the degree of petechial hemorrhage seen, I would favor aspirin monotherapy as opposed to dual antiplatelet.  My suspicion is this is vascular, but the distribution is slightly unusual.  He will need intracranial vascular imaging as well.  RECOMMENDATIONS  - HgbA1c, fasting lipid panel - MRI of the brain without contrast - Frequent neuro checks - Echocardiogram - CTA head and neck -  Prophylactic therapy-Antiplatelet med: Aspirin - dose 81mg   Daily - Risk factor modification - Telemetry monitoring - PT consult, OT consult, Speech  consult ______________________________________________________________________    Signed, Ritta Slot, MD [facility]

## 2023-09-09 NOTE — ED Triage Notes (Signed)
 First nurse note: Pt here via [facility] from home with c/o of CP and aphasia, MRI was done yesterday showed possibility of stroke.   Pt has HX of seizures as well as brain tumor.

## 2023-09-09 NOTE — Assessment & Plan Note (Signed)
 Home Lamictal 150 mg p.o. twice daily resumed on admission

## 2023-09-09 NOTE — ED Notes (Signed)
 Trey Paula in CT called, pt ready for scan

## 2023-09-09 NOTE — ED Notes (Addendum)
 Masny,MD  paged to come to bedside by [facility]. Wife came out asking for assistance. Pt reports severe headache, is flushed, memory loss, R leg shaking and aphasia is much worse. Alexandra, PA at bedside laying eyes on pt while waiting for [facility].

## 2023-09-09 NOTE — ED Notes (Signed)
 Pt to CT via stretcher at this time

## 2023-09-09 NOTE — Hospital Course (Signed)
 Mr. Teo Moede is a 53 year old male with history of seizure, brain tumor with necrosis, history of CVA with aphasia, presents to the emergency department for chief concerns of stroke on MRI.  Vitals in the ED showed temperature of 98.3, respiration rate 17, heart rate 74, blood pressure 136/82, SpO2 of 98% on room air.  Serum sodium is 138, potassium 4.2, chloride 103, bicarb 27, BUN of 15, serum creatinine 0.95, EGFR greater than 60, nonfasting blood glucose 175, WBC 7.1, hemoglobin 15.8, platelets of 210.  High sensitive troponin is 10.  Outpatient MRI brain on 09/07/2023: Read as foci of restricted diffusion and T2 FLAIR hyperintense signal abnormality within the right cerebral peduncle, anterior left temporal lobe, colossal genu and right insula, as described and new from prior MRI on 07/06/2023.  ED treatment: None.  EDP discussed with neurology, Dr. Amada Jupiter who recommends hospital admission and to follow direction from Dr. Barbaraann Cao, medical oncology note on 09/08/2023.

## 2023-09-09 NOTE — ED Notes (Signed)
 Pt transported to MRI

## 2023-09-09 NOTE — H&P (Addendum)
 History and Physical   Alan Wise QMV:[id number] DOB: 22-Aug-1970 DOA: 09/09/2023  PCP: Lorre Munroe, NP  Patient coming from: home via POV  I have personally briefly reviewed patient's old medical records in [facility] EMR.  Chief Concern: new stroke on outpatient MRI  HPI: Mr. Alan Wise is a 53 year old male with history of seizure, brain tumor with necrosis, history of CVA with aphasia, presents to the emergency department for chief concerns of stroke on MRI.  Vitals in the ED showed temperature of 98.3, respiration rate 17, heart rate 74, blood pressure 136/82, SpO2 of 98% on room air.  Serum sodium is 138, potassium 4.2, chloride 103, bicarb 27, BUN of 15, serum creatinine 0.95, EGFR greater than 60, nonfasting blood glucose 175, WBC 7.1, hemoglobin 15.8, platelets of 210.  High sensitive troponin is 10.  Outpatient MRI brain on 09/07/2023: Read as foci of restricted diffusion and T2 FLAIR hyperintense signal abnormality within the right cerebral peduncle, anterior left temporal lobe, colossal genu and right insula, as described and new from prior MRI on 07/06/2023.  ED treatment: None.  EDP discussed with neurology, Dr. Amada Jupiter who recommends hospital admission and to follow direction from Dr. Barbaraann Cao, medical oncology note on 09/08/2023. --------------------------------------- At bedside, patient is able to tell me his first and last name, age, current location, current calendar year.  He reports decreasing activity over the last week. He reports baseline difficulty speaking, which is unchanged.  Last week, he went on Spring Break to spend time with his parents. On weDnesday, he fell from two step ladder and pt did not provide much information.  On Thursday, mother noticed that patient had a facial droop.  On Friday, he came home and spouse reports that she was concerned about a stroke and called Dr. Barbaraann Cao.  Family reports that she noticed facial droop and  slurred speech which is new for him.  At bedside, she states that this has improved.  Patient has left sided neglect/decreased visual field.  Spouse also notes that he has been having trouble swallowing lately and she is concerned about his swallowing ability and is requesting speech therapist.  Social history: He lives at home with his spouse.   ROS: Constitutional: no weight change, no fever ENT/Mouth: no sore throat, no rhinorrhea Eyes: no eye pain, no vision changes Cardiovascular: no chest pain, no dyspnea,  no edema, no palpitations Respiratory: no cough, no sputum, no wheezing Gastrointestinal: no nausea, no vomiting, no diarrhea, no constipation Genitourinary: no urinary incontinence, no dysuria, no hematuria Musculoskeletal: no arthralgias, no myalgias Skin: no skin lesions, no pruritus, Neuro: + weakness, no loss of consciousness, no syncope Psych: no anxiety, no depression, + decrease appetite Heme/Lymph: no bruising, no bleeding  ED Course: Discussed with EDP, patient requiring hospitalization for chief concerns of stroke.  Assessment/Plan  Principal Problem:   Stroke [facility]) Active Problems:   Oligodendroglioma (HCC)   Status epilepticus (HCC)   Type 2 diabetes mellitus with hyperglycemia (HCC)   Hyperlipidemia associated with type 2 diabetes mellitus (HCC)   Focal seizures (HCC)   Essential hypertension   Assessment and Plan:  * Stroke [facility]) Neurology has been consulted and we appreciate further recommendations Complete echo Fasting lipid and A1c ordered Outside window for permissive hypertension Frequent neuro vascular checks N.p.o. pending formal SLP evaluation PT, OT, SLP Fall precaution and aspiration precaution  Status epilepticus (HCC) Home Lamictal 150 mg p.o. twice daily resumed on admission  Essential hypertension Patient is outside window for permissive  hypertension Home amlodipine 5 mg daily resumed Home propranolol 20 mg p.o. twice daily  resumed on admission Hydralazine 5 mg IV every 6 hours as needed for SBP greater than 165, 5 days ordered  Hyperlipidemia associated with type 2 diabetes mellitus (HCC) Atorvastatin 40 mg daily initiated on admission  Type 2 diabetes mellitus with hyperglycemia (HCC) Insulin SSI with at bedtime coverage ordered Goal inpatient blood glucose levels 140-180  Chart reviewed.   DVT prophylaxis: ted hose Code Status: full code Diet: N.p.o. pending speech evaluation due to patient spouse endorsing the patient has been having difficulty swallowing Family Communication: updated spouse at bedside with patient's permission Disposition Plan: pending clinical course Consults called: neurology Admission status: telemetry medical, observation  Past Medical History:  Diagnosis Date   Allergy    Complication of anesthesia    pt reports he is starting to have more difficulty arousing after surgery   Diabetes mellitus (HCC)    GERD (gastroesophageal reflux disease)    Headache    History of kidney stones    Hyperlipidemia    Renal disorder    kidney stones   Past Surgical History:  Procedure Laterality Date   APPLICATION OF CRANIAL NAVIGATION Right 01/17/2021   Procedure: APPLICATION OF CRANIAL NAVIGATION;  Surgeon: Bedelia Person, MD;  Location: [facility] OR;  Service: Neurosurgery;  Laterality: Right;   COLONOSCOPY  09/03/1994   COLONOSCOPY WITH PROPOFOL N/A 01/07/2023   Procedure: COLONOSCOPY WITH PROPOFOL;  Surgeon: [name], MD;  Location: [facility] ENDOSCOPY;  Service: Endoscopy;  Laterality: N/A;  NOT TOO EARLY   CYSTOSCOPY WITH STENT PLACEMENT Right 01/25/2016   Procedure: CYSTOSCOPY WITH STENT PLACEMENT;  Surgeon: Hildred Laser, MD;  Location: [facility] ORS;  Service: Urology;  Laterality: Right;   FRAMELESS  BIOPSY WITH BRAINLAB Right 01/17/2021   Procedure: RIGHT STEREOTACTIC BIOPSY OF INSULAR LESION;  Surgeon: Bedelia Person, MD;  Location: [facility] OR;  Service: Neurosurgery;  Laterality:  Right;   KNEE ARTHROSCOPY W/ MENISCAL REPAIR Right 06/23/1988   POLYPECTOMY  01/07/2023   Procedure: POLYPECTOMY;  Surgeon: [name], MD;  Location: [facility] ENDOSCOPY;  Service: Endoscopy;;   removal of birthmark  06/08/2013   SKIN CANCER EXCISION  06/23/2012   TYMPANOSTOMY TUBE PLACEMENT  08/06/1978   URETEROSCOPY WITH HOLMIUM LASER LITHOTRIPSY Right 01/25/2016   Procedure: URETEROSCOPY WITH HOLMIUM LASER LITHOTRIPSY;  Surgeon: Hildred Laser, MD;  Location: [facility] ORS;  Service: Urology;  Laterality: Right;   URETHRAL STRICTURE DILATATION     visual inspection of vocal cord     1973   WISDOM TOOTH EXTRACTION     Social History:  reports that he has never smoked. He has never used smokeless tobacco. He reports that he does not currently use alcohol. He reports that he does not use drugs.  Allergies  Allergen Reactions   Contrast Media [Iodinated Contrast Media] Swelling   Family History  Problem Relation Age of Onset   Hyperlipidemia Father    Hypertension Father    Diabetes Father    Benign prostatic hyperplasia Father    Stroke Maternal Grandmother    Diabetes Maternal Grandmother    Stroke Paternal Grandmother    Hypertension Paternal Grandmother    Kidney disease Neg Hx    Prostate cancer Neg Hx    Family history: Family history reviewed and not pertinent.  Prior to Admission medications   Medication Sig Start Date End Date Taking? Authorizing Provider  amLODipine (NORVASC) 5 MG tablet Take 1 tablet (5 mg total)  by mouth daily. 07/09/23   Henreitta Leber, MD  aspirin EC 81 MG tablet Take 1 tablet (81 mg total) by mouth daily. Swallow whole. 09/08/23   Henreitta Leber, MD  atorvastatin (LIPITOR) 40 MG tablet Take 1 tablet (40 mg total) by mouth daily. 09/08/23   Henreitta Leber, MD  cholecalciferol (VITAMIN D3) 25 MCG (1000 UNIT) tablet Take 1 tablet (1,000 Units total) by mouth daily. 08/10/23   Vaslow, Georgeanna Lea, MD  diazePAM, 15 MG Dose, (VALTOCO 15 MG DOSE) 2 x  7.5 MG/0.1ML LQPK Place 15 mg into the nose once as needed for up to 1 dose (seizure). 05/15/23   Azucena Fallen, MD  lamoTRIgine (LAMICTAL) 150 MG tablet Take 1 tablet (150 mg total) by mouth 2 (two) times daily. 09/07/23   Vaslow, Georgeanna Lea, MD  propranolol (INDERAL) 20 MG tablet TAKE 1 TABLET BY MOUTH TWICE A DAY 08/20/23   Henreitta Leber, MD   Physical Exam: Vitals:   09/09/23 1233  BP: 136/83  Pulse: 74  Resp: 17  Temp: 98.3 F (36.8 C)  TempSrc: Oral  SpO2: 98%   Constitutional: appears older than chronological age, frail, chronically ill Eyes: PERRL, lids and conjunctivae normal ENMT: Mucous membranes are moist. Posterior pharynx clear of any exudate or lesions. Age-appropriate dentition. Hearing appropriate Neck: normal, supple, no masses, no thyromegaly Respiratory: clear to auscultation bilaterally, no wheezing, no crackles. Normal respiratory effort. No accessory muscle use.  Cardiovascular: Regular rate and rhythm, no murmurs / rubs / gallops. No extremity edema. 2+ pedal pulses. No carotid bruits.  Abdomen: no tenderness, no masses palpated, no hepatosplenomegaly. Bowel sounds positive.  Musculoskeletal: no clubbing / cyanosis. No joint deformity upper and lower extremities. Good ROM, no contractures, no atrophy. Normal muscle tone.  Skin: no rashes, lesions, ulcers. No induration Neurologic: Sensation intact. Strength 5/5 in all 4.  Psychiatric: Normal judgment and insight. Alert and oriented x 3. Normal mood.   EKG: independently reviewed, showing sinus rhythm with rate of 76, QTc 425  Chest x-ray on Admission: I personally reviewed and I agree with radiologist reading as below.  DG Chest Port 1 View Result Date: 09/09/2023 CLINICAL DATA:  [id number] Chest pain [id number] EXAM: PORTABLE CHEST 1 VIEW COMPARISON:  04/11/2023 FINDINGS: Bilateral lung fields are clear. Bilateral costophrenic angles are clear. Normal cardio-mediastinal silhouette. No acute osseous  abnormalities. The soft tissues are within normal limits. IMPRESSION: No active disease. Electronically Signed   By: Jules Schick M.D.   On: 09/09/2023 15:09   MR BRAIN W WO CONTRAST Result Date: 09/07/2023 CLINICAL DATA:  Provided history: Oligodendroglioma. Brain/CNS neoplasm, assess treatment response. Additional history provided by the scanning technologist: Multiple falls, vision loss in left eye, aphasia. Avastin therapy. Restaging. EXAM: MRI HEAD WITHOUT AND WITH CONTRAST TECHNIQUE: Multiplanar, multiecho pulse sequences of the brain and surrounding structures were obtained without and with intravenous contrast. CONTRAST:  6mL GADAVIST GADOBUTROL 1 MMOL/ML IV SOLN COMPARISON:  Brain MRI 07/06/2023. FINDINGS: Brain: Mild generalized cerebral atrophy. 9 mm focus of restricted diffusion with associated T2 FLAIR hyperintense signal abnormality in the right cerebral peduncle (series 5, image 73). 5 mm focus of restricted diffusion with associated T2 FLAIR hyperintense signal abnormality in the anterior left temporal lobe (series 5, image 74). 3 mm focus of restricted diffusion within the callosal genu at midline (series 5, image 81). 5 mm focus of restricted diffusion within the right insula with associated T2 FLAIR hyperintense signal abnormality. These foci are new as  compared to the brain MRI of 07/06/2023 and may reflect acute/early subacute infarcts. However, new sites of tumor cannot be excluded. There is a new 3 mm focus of enhancement in the right subinsular region (series 16, image 90). No other new or progressive foci of enhancement are identified. An irregular focus of restricted diffusion within the right retrolenticular/periatrial region has mildly increased in extent since the prior MRI. A region of restricted diffusion within the mid and anterior right frontal lobe (along the margins of the right lateral ventricle) has also slightly increased in extent (for instance, extending more medially  within the body of the corpus callosum on series 10, image 25). T2 FLAIR hyperintense signal abnormality within the right cerebral hemisphere has not significantly changed, nor has T2 FLAIR hyperintense signal abnormality within the body/genu of corpus callosum and left frontal lobe periventricular white matter. Unchanged parenchymal swelling at the right insula. Superimposed foci of chronic blood products within the right frontal lobe have increased in extent. Redemonstrated focus of cystic encephalomalacia at the anterior right insula. Chronic lacunar infarcts again demonstrated within the left aspect of the pons. Mild multifocal T2 FLAIR hyperintense signal abnormality elsewhere within the cerebral white matter and pons, nonspecific but compatible chronic small vessel ischemic disease. No extra-axial fluid collection. No midline shift. Vascular: Maintained flow voids within the proximal large arterial vessels. Skull and upper cervical spine: No focal worrisome marrow lesion. Right frontoparietal burr hole. Sinuses/Orbits: No focal worrisome marrow lesion. Incompletely assessed cervical spondylosis. Other: No mass or acute finding within the imaged orbits. Mild mucosal thickening within the left ethmoid and left maxillary sinuses. MRi brain impression #1 called by telephone at the time of interpretation on 09/07/2023 at 7:28 pm to provider [facility] VASLOW , who verbally acknowledged these results. IMPRESSION: 1. Foci of restricted diffusion and T2 FLAIR hyperintense signal abnormality within the right cerebral peduncle, anterior left temporal lobe, callosal genu and right insula, as described and new from the prior brain MRI of 07/06/2023. These foci may reflect acute/early subacute infarcts. However, new sites of tumor cannot be excluded. A short-interval follow-up brain MRI is recommended. 2. Foci of restricted diffusion within the right retrolenticular/periatrial region and right frontal lobe have slightly  increased in extent since the prior MRI. Additionally, there is a new 3 mm right subinsular focus of parenchymal enhancement. These findings may reflect evolving treatment changes. However, a short-interval follow-up brain MRI is recommended for close surveillance. 3. Pre-contrast T1 hyperintense chronic blood products within the right frontal lobe have increased in extent. 4. Otherwise no significant interval change. 5. 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 Electronically Signed   By: Jackey Loge D.O.   On: 09/07/2023 20:07   Labs on Admission: I have personally reviewed following labs  CBC: Recent Labs  Lab 09/07/23 1218 09/09/23 1240  WBC 8.7 7.1  NEUTROABS 5.9  --   HGB 15.4 15.8  HCT 43.8 44.3  MCV 82.6 83.0  PLT 195 210   Basic Metabolic Panel: Recent Labs  Lab 09/09/23 1240  NA 138  K 4.2  CL 103  CO2 27  GLUCOSE 175*  BUN 15  CREATININE 0.95  CALCIUM 9.3   GFR: Estimated Creatinine Clearance: 87.4 mL/min (by C-G formula based on SCr of 0.95 mg/dL).  CBG: Recent Labs  Lab 09/09/23 1243 09/09/23 1545  GLUCAP 158* 103*   Urine analysis:    Component Value Date/Time   COLORURINE YELLOW (A) 01/24/2016 2152  APPEARANCEUR Cloudy (A) 02/20/2016 1441   LABSPEC 1.010 01/24/2016 2152   LABSPEC 1.026 05/04/2012 2052   PHURINE 6.0 01/24/2016 2152   GLUCOSEU 3+ (A) 02/20/2016 1441   GLUCOSEU Negative 05/04/2012 2052   HGBUR 3+ (A) 01/24/2016 2152   BILIRUBINUR Negative 02/20/2016 1441   BILIRUBINUR Negative 05/04/2012 2052   KETONESUR NEGATIVE 01/24/2016 2152   PROTEINUR 100 (A) 09/07/2023 1218   NITRITE Negative 02/20/2016 1441   NITRITE NEGATIVE 01/24/2016 2152   LEUKOCYTESUR Negative 02/20/2016 1441   LEUKOCYTESUR Negative 05/04/2012 2052   This document was prepared using Dragon Voice Recognition software and may include unintentional dictation errors.  Dr. Sedalia Muta [facility]  If 7PM-7AM, please contact overnight-coverage provider If 7AM-7PM, please contact day attending provider [email or web address]  09/09/2023, 4:38 PM

## 2023-09-09 NOTE — Assessment & Plan Note (Addendum)
 Patient is outside window for permissive hypertension Home amlodipine 5 mg daily resumed Home propranolol 20 mg p.o. twice daily resumed on admission Hydralazine 5 mg IV every 6 hours as needed for SBP greater than 165, 5 days ordered

## 2023-09-09 NOTE — Assessment & Plan Note (Addendum)
 Neurology has been consulted and we appreciate further recommendations Complete echo Fasting lipid and A1c ordered Outside window for permissive hypertension Frequent neuro vascular checks N.p.o. pending formal SLP evaluation PT, OT, SLP Fall precaution and aspiration precaution

## 2023-09-09 NOTE — ED Notes (Signed)
Covering provider at bedside

## 2023-09-09 NOTE — Assessment & Plan Note (Addendum)
 -  Insulin SSI with at bedtime coverage ordered ?- Goal inpatient blood glucose levels 140-180 ?

## 2023-09-09 NOTE — ED Triage Notes (Signed)
 Pt to ED via [facility] from home. Pt started having tightness and chest pressure. Wife reports increased confusion and weakness and pt was asking for help and to call someone. Pt has aphasia from prior stroke. Pt with hx of seizures and brain tumor and brain necrosis.   MRI yesterday showed possibly new stroke that occurred on Thursday and pt was sent home on Friday. Pt has lost vision in left eye from brain necrosis since January.   Pt had fall last Wednesday when he fell off ladder.

## 2023-09-09 NOTE — ED Notes (Addendum)
 Mansy, MD at bedside speaking with wife. Wife is expressing concerns about pts care and has reiterated several times she would like for pt to be transferred to [facility].

## 2023-09-09 NOTE — ED Notes (Addendum)
 Mansy, MD at bedside speaking with wife. Pt is still disoriented x4 And shaking. Hx of seizures due to brain tumor. Wife is requesting to be transferred to [facility].

## 2023-09-09 NOTE — ED Provider Notes (Addendum)
 [facility] Provider Note    Event Date/Time   First MD Initiated Contact with Patient 09/09/23 1249     (approximate)   History   Altered Mental Status, Chest Pain, and Aphasia   HPI  Alan Wise is a 53 year old male with history of T2DM, oligodendroglioma following with [facility] oncology, seizure disorder presenting to the emergency department for evaluation of confusion and chest tightness.  Had concerning outpatient MRI for stroke yesterday.  It was recommended the patient present to the ER, but family reports that they were unaware that this was the recommendation and thought it was only if patient had worsening symptoms.  Today, patient has had onset of increased cough with chest tightness.  Does have a history of trouble swallowing.  Patient was seen in his oncology office on 09/07/2023.  At that time he was noted to have new focal deficits localizing to the right frontal lobe.  A stat MRI brain was performed as an outpatient.  This did serrated an area of restricted diffusion concerning for acute/early subacute infarcts though new sites of tumor are possible.  Outpatient recommendations for management were provided as the patient initially declined ER evaluation, but family is now comfortable with ER admission for further management.      Physical Exam   Triage Vital Signs: ED Triage Vitals [09/09/23 1233]  Encounter Vitals Group     BP 136/83     Systolic BP Percentile      Diastolic BP Percentile      Pulse Rate 74     Resp 17     Temp 98.3 F (36.8 C)     Temp Source Oral     SpO2 98 %     Weight      Height      Head Circumference      Peak Flow      Pain Score      Pain Loc      Pain Education      Exclude from Growth Chart     Most recent vital signs: Vitals:   09/09/23 1233  BP: 136/83  Pulse: 74  Resp: 17  Temp: 98.3 F (36.8 C)  SpO2: 98%     General: Awake, interactive  CV:  Regular rate, good peripheral  perfusion.  Resp:  Unlabored respirations, frequent coughing, lungs clear Abd:  Nondistended, soft, palpation Neuro:  Keenly aware, quickly answers age, says the month is initially October, then "3" but unable to state the month.  Left visual field cut that is not new.  No arm or leg drift.  No limb ataxia.  Normal sensation.  Reported aphasia, but is able to speak a few words that are not obviously dysarthric.  No inattention.   ED Results / Procedures / Treatments   Labs (all labs ordered are listed, but only abnormal results are displayed) Labs Reviewed  BASIC METABOLIC PANEL - Abnormal; Notable for the following components:      Result Value   Glucose, Bld 175 (*)    All other components within normal limits  CBG MONITORING, ED - Abnormal; Notable for the following components:   Glucose-Capillary 158 (*)    All other components within normal limits  CBC  TROPONIN I (HIGH SENSITIVITY)  TROPONIN I (HIGH SENSITIVITY)     EKG EKG independently reviewed interpreted by myself (ER attending) demonstrates:  EKG demonstrates normal sinus rhythm rate of 76, PR 140, QRS 80, QTc 425, no acute ST  changes  RADIOLOGY Imaging independently reviewed and interpreted by myself demonstrates:  CXR without obvious focal consolidation on my review  PROCEDURES:  Critical Care performed: Yes, see critical care procedure note(s)  Procedures   MEDICATIONS ORDERED IN ED: Medications - No data to display   IMPRESSION / MDM / ASSESSMENT AND PLAN / ED COURSE  I reviewed the triage vital signs and the nursing notes.  Differential diagnosis includes, but is not limited to, sequela from recent identified CVA, consideration for aspiration leading to cough, pneumonia, consideration but lower suspicion ACS  Patient's presentation is most consistent with acute presentation with potential threat to life or bodily function.  53 year old male presenting with recent neurologic changes, outpatient MRI  demonstrating concerns for stroke.  Additionally here with chest tightness and cough.  Concern for aspiration based on history.  Reviewed oncology notes recommending admission yesterday, we will plan on this today as patient and family are agreeable.  Outside window for stroke intervention, no indication for code stroke.  Labs from triage overall reassuring including negative troponin.  X-Laurence Folz without obvious focal consolidation on my review.  EKG nonischemic.  Discussed with neurology as below to ensure that patient can be kept at our facility given his history of intracranial mass.  Will reach out to hospitalist team.  From outpatient oncology note from yesterday:  "Therefore recommended the following: -Hold avastin indefinitely -Start ASA 81mg  daily -Start atorvastatin 40mg  daily -Transthoracic echocardiogram -Repeat MRI with MRA in 1 month"  Clinical Course as of 09/09/23 1526  Wed Sep 09, 2023  1331 Case reviewed with Dr. Amada Jupiter with neurology.  He agrees with recommendations from Dr. Liana Gerold outpatient note from yesterday, but is available for consult.  He does feel that patient can be admitted to our facility for further evaluation. [NR]  1526 Case discussed with Dr. Sedalia Muta.  She will evaluate the patient for anticipated admission. [NR]    Clinical Course User Index [NR] Trinna Post, MD     FINAL CLINICAL IMPRESSION(S) / ED DIAGNOSES   Final diagnoses:  Cerebrovascular accident (CVA), unspecified mechanism (HCC)  Cough, unspecified type     Rx / DC Orders   ED Discharge Orders     None        Note:  This document was prepared using Dragon voice recognition software and may include unintentional dictation errors.   Trinna Post, MD 09/09/23 1507    Trinna Post, MD 09/09/23 [phone]

## 2023-09-09 NOTE — Assessment & Plan Note (Signed)
Atorvastatin 40 mg daily initiated on admission

## 2023-09-10 ENCOUNTER — Other Ambulatory Visit: Payer: Self-pay | Admitting: Nurse Practitioner

## 2023-09-10 ENCOUNTER — Ambulatory Visit: Payer: 59 | Admitting: Speech Pathology

## 2023-09-10 ENCOUNTER — Observation Stay
Admit: 2023-09-10 | Discharge: 2023-09-10 | Disposition: A | Discharge status: Home / Self Care | Attending: Internal Medicine

## 2023-09-10 ENCOUNTER — Other Ambulatory Visit: Payer: Self-pay | Admitting: Radiation Therapy

## 2023-09-10 ENCOUNTER — Encounter (HOSPITAL_BASED_OUTPATIENT_CLINIC_OR_DEPARTMENT_OTHER): Payer: Self-pay

## 2023-09-10 ENCOUNTER — Other Ambulatory Visit (HOSPITAL_BASED_OUTPATIENT_CLINIC_OR_DEPARTMENT_OTHER)

## 2023-09-10 DIAGNOSIS — I639 Cerebral infarction, unspecified: Secondary | ICD-10-CM

## 2023-09-10 DIAGNOSIS — G40909 Epilepsy, unspecified, not intractable, without status epilepticus: Secondary | ICD-10-CM

## 2023-09-10 DIAGNOSIS — I6389 Other cerebral infarction: Secondary | ICD-10-CM

## 2023-09-10 DIAGNOSIS — Z794 Long term (current) use of insulin: Secondary | ICD-10-CM

## 2023-09-10 DIAGNOSIS — R569 Unspecified convulsions: Secondary | ICD-10-CM | POA: Diagnosis not present

## 2023-09-10 LAB — ECHOCARDIOGRAM COMPLETE
AR max vel: 2.78 cm2
AV Area VTI: 3.01 cm2
AV Area mean vel: 2.94 cm2
AV Mean grad: 2 mmHg
AV Peak grad: 4 mmHg
Ao pk vel: 1 m/s
Area-P 1/2: 3.58 cm2
MV VTI: 2.5 cm2
S' Lateral: 2.6 cm

## 2023-09-10 LAB — GLUCOSE, CAPILLARY
Glucose-Capillary: 163 mg/dL — ABNORMAL HIGH (ref 70–99)
Glucose-Capillary: 103 mg/dL — ABNORMAL HIGH (ref 70–99)

## 2023-09-10 LAB — CBG MONITORING, ED
Glucose-Capillary: 75 mg/dL (ref 70–99)
Glucose-Capillary: 84 mg/dL (ref 70–99)
Glucose-Capillary: 82 mg/dL (ref 70–99)
Glucose-Capillary: 88 mg/dL (ref 70–99)

## 2023-09-10 LAB — LIPID PANEL
Cholesterol: 207 mg/dL — ABNORMAL HIGH (ref 0–200)
HDL: 28 mg/dL — ABNORMAL LOW (ref >40)
LDL Cholesterol: 156 mg/dL — ABNORMAL HIGH (ref 0–99)
Total CHOL/HDL Ratio: 7.4 ratio
Triglycerides: 116 mg/dL (ref <150)
VLDL: 23 mg/dL (ref 0–40)

## 2023-09-10 NOTE — Progress Notes (Signed)
 NEUROLOGY CONSULT FOLLOW UP NOTE   Date of service: September 10, 2023 Patient Name: Alan Wise MRN:  [id number] DOB:  12-Nov-1970   Interval Hx/subjective   He had a seizure overnight Vitals   Vitals:   09/10/23 1030 09/10/23 1100 09/10/23 1200 09/10/23 1300  BP:  (!) 162/92 (!) 141/98 120/85  Pulse: 96 79 67 60  Resp:  17    Temp:  (!) 97.4 F (36.3 C)    TempSrc:  Oral    SpO2: 94% 95% 94% 95%     There is no height or weight on file to calculate BMI.  Physical Exam   Constitutional: Appears well-developed and well-nourished.   Neurologic Examination   Gen: in bed, NAD  Neuro: MS: Awake, alert, interactive and appropriate CN: He has a left-sided field cut, mild left facial weakness Motor: He has mild weakness of his left grip, but no drift Sensory: Intact to light touch  Labs and Diagnostic Imaging   CBC:  Recent Labs  Lab 09/07/23 1218 09/09/23 1240  WBC 8.7 7.1  NEUTROABS 5.9  --   HGB 15.4 15.8  HCT 43.8 44.3  MCV 82.6 83.0  PLT 195 210    Basic Metabolic Panel:  Lab Results  Component Value Date   NA 138 09/09/2023   K 4.2 09/09/2023   CO2 27 09/09/2023   GLUCOSE 175 (H) 09/09/2023   BUN 15 09/09/2023   CREATININE 0.95 09/09/2023   CALCIUM 9.3 09/09/2023   GFRNONAA >60 09/09/2023   GFRAA 123 12/05/2020   Lipid Panel:  Lab Results  Component Value Date   LDLCALC 156 (H) 09/10/2023   HgbA1c:  Lab Results  Component Value Date   HGBA1C 6.3 (H) 08/19/2023   Urine Drug Screen: No results found for: "LABOPIA", "COCAINSCRNUR", "LABBENZ", "AMPHETMU", "THCU", "LABBARB"   Imaging(Personally reviewed): Repeat MRI, fading diffusion changes   Impression   PEDROHENRIQUE MCCONVILLE is a 53 y.o. 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  His echo does not reveal a clear embolic source, nor does his vascular imaging.  At this point, I would favor further investigation given  his young age, and would recommend TEE and prolonged cardiac monitoring.  He did have a seizure overnight, he only got one dose of his lamotrigine yesterday.  He only recently increased to 150 twice daily on Tuesday, and therefore I would continue at this dose  Recommendations  TEE Continue aspirin 81 mg daily He is outside the permissive hypertension window He will need prolonged cardiac monitoring, loop recorder if possible Continue atorvastatin 40 mg daily Continue lamotrigine 150 twice daily Neurology will follow ______________________________________________________________________   Thank you for the opportunity to take part in the care of this patient. If you have any further questions, please contact the neurology consultation team on call. Updated oncall schedule is listed on AMION.  Signed,  Ritta Slot Neurohospitalist

## 2023-09-10 NOTE — Progress Notes (Signed)
*  PRELIMINARY RESULTS* Echocardiogram 2D Echocardiogram has been performed.  Cristela Blue 09/10/2023, 8:57 AM

## 2023-09-10 NOTE — Consult Note (Incomplete)
 ELECTROPHYSIOLOGY CONSULT NOTE  Patient ID: Alan Wise MRN: [id number], DOB/AGE: 1970-11-14   Admit date: 09/09/2023 Date of Consult: 09/10/2023  Primary Physician: Lorre Munroe, NP Primary Cardiologist: None  Primary Electrophysiologist: New to None  Reason for Consultation: Cryptogenic stroke; recommendations regarding Implantable Loop Recorder Insurance: [employer] state health plan  History of Present Illness EP has been asked to evaluate Alan Wise for placement of an implantable loop recorder to monitor for atrial fibrillation by Dr  Jobe Marker .  The patient was admitted on 09/09/2023 with L facial weakness and slurred speech.  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  His  symptoms began about a week ago on 3/13 while visiting family out of town. He was evaluated by his oncologist 3/17, who recommended a stat brain MRI. MRI Imaging demonstrated multiple areas of restricted diffusion, concerning for acute ischemia, and he was recommended to see care at emergency room   {Blank single:19197::"He","She"} has undergone workup for stroke including:   MRI brain, 3/17 1. Foci of restricted diffusion and T2 FLAIR hyperintense signal abnormality within the right cerebral peduncle, anterior left temporal lobe, callosal genu and right insula, as described and new from the prior brain MRI of 07/06/2023. These foci may reflect acute/early subacute infarcts. However, new sites of tumor cannot be excluded. A short-interval follow-up brain MRI is recommended. 2. Foci of restricted diffusion within the right retrolenticular/periatrial region and right frontal lobe have slightly increased in extent since the prior MRI. Additionally, there is a new 3 mm right subinsular focus of parenchymal enhancement. These findings may reflect evolving treatment changes. However, a short-interval follow-up brain MRI is recommended for close  surveillance. 3. Pre-contrast T1 hyperintense chronic blood products within the right frontal lobe have increased in extent. 4. Otherwise no significant interval change. 5. 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  MRA head, neck - Normal MRA of the head and neck. No large vessel occlusion, hemodynamically significant stenosis, or other acute vascular abnormality.  CT head -  1. Post treatment changes within the right frontal lobe with a mild-to-moderate amount of curvilinear increased attenuation within this region, as described above. Given the patient's history of oligodendroglioma, this may represent areas of post treatment mineralization and calcification. A small amount of acute hemorrhage cannot completely be excluded. MRI correlation is recommended. 2. Generalized cerebral atrophy and microvascular disease changes of the supratentorial brain. 3. Marked severity left ethmoid sinus disease  Bubble TTE-    TEE pending     The patient has been monitored on telemetry which has demonstrated sinus rhythm with no arrhythmias.  Inpatient stroke work-up {Blank single:19197::"will","will not"} require a TEE per Neurology.   Echocardiogram as above. Lab work is reviewed.  Prior to admission, the patient denies chest pain, shortness of breath, dizziness, palpitations, or syncope.  {Blank single:19197::"He","She"} is recovering from {Blank single:19197::"his","her"} stroke with plans to {Blank single:19197::"return home","attend CIR","rehab at SNF"}  at discharge.  Allergies, Past Medical, Surgical, Social, and Family Histories have been reviewed and are referenced here-in when relevant for medical decision making.   Inpatient Medications:   amLODipine  5 mg Oral Daily   aspirin EC  81 mg Oral Daily   atorvastatin  40 mg Oral Q2000   cholecalciferol  1,000 Units Oral Daily   insulin aspart  0-15 Units Subcutaneous  TID WC   insulin aspart  0-5 Units Subcutaneous QHS  lamoTRIgine  150 mg Oral BID   propranolol  20 mg Oral BID   sodium chloride flush  3-10 mL Intravenous Q12H    Physical Exam: Vitals:   09/10/23 0900 09/10/23 1000 09/10/23 1030 09/10/23 1100  BP: (!) 141/89 (!) 145/124  (!) 162/92  Pulse: 64 73 96 79  Resp:  16  17  Temp:    (!) 97.4 F (36.3 C)  TempSrc:    Oral  SpO2: 95% 95% 94% 95%    GEN- NAD. A&O x 3. Normal affect. HEENT: Normocephalic, atraumatic Lungs- CTAB, Normal effort.  Heart- {EPRHYTHM:28826} rate and rhythm. No M/G/R.  Extremities- {EDEMA LEVEL:28147::"No"} peripheral edema. no clubbing or cyanosis Skin- warm and dry, no rash or lesion. Neuro ***   12-lead ECG  (personally reviewed) All prior EKG's in EPIC reviewed with no documented atrial fibrillation  Telemetry *** (personally reviewed)  Assessment and Plan:  1. Cryptogenic stroke The patient presents with cryptogenic stroke.  The patient {Blank single:19197::"does","does not"} have a TEE planned for this AM.  I spoke at length with the patient about monitoring for afib with an implantable loop recorder.  Risks, benefits, and alteratives to implantable loop recorder were discussed with the patient today.   At this time, {Blank single:19197::"the patient is very clear in their decision to proceed with implantable loop recorder.","the patient refuses loop consideration, and has opted to wear an event monitor. If the patient chooses to leave and be considered for outpatient loop recorder, the co-pay usually ranges from $0-250 depending on insurance. The patient or family can call insurance and provide CPT code 33285.","the patient is not a loop candidate, and has opted to wear an event monitor","refuses monitoring","***"}     {Blank single:19197::"Wound care was reviewed with the patient (keep incision clean and dry for 3 days). Please call with questions.","Please call with questions."}   *** If yes,  alert cath lab to add to MD schedule ***  Sherie Don, NP 09/10/2023 11:57 AM

## 2023-09-10 NOTE — Progress Notes (Addendum)
 Critical care note:  Date of service/note: 09/09/2023.   Subjective: The patient was seen and examined given headache with feeling of being flushed and right upper and lower extremity twitching.  During the interview the patient had a right-sided upper and lower extremity seizures.  His expressive dysphasia was worse.  No other acute events.  Objective: Physical examination: Generally: Acutely ill middle-aged Caucasian male in no acute respiratory distress. Vital signs: BP was 163/80, heart rate 66, respiratory rate 14, pulse oximetry 92 to 99% on room air and temperature was 97.8 earlier. Head - atraumatic, normocephalic.  Pupils - equal, round and reactive to light and accommodation. Extraocular movements are intact. No scleral icterus.  Oropharynx - moist mucous membranes and tongue. No pharyngeal erythema or exudate.  Neck - supple. No JVD. Carotid pulses 2+ bilaterally. No carotid bruits. No palpable thyromegaly or lymphadenopathy. Cardiovascular - regular rate and rhythm. Normal S1 and S2. No murmurs, gallops or rubs.  Lungs - clear to auscultation bilaterally.  Abdomen - soft and nontender. Positive bowel sounds. No palpable organomegaly or masses.  Extremities - no pitting edema, clubbing or cyanosis.  Neuro - grossly non-focal. Skin - no rashes. GU and rectal exam - deferred.   Notes and labs reviewed.  A stat head CT scan was ordered revealing the following: 1. Post treatment changes within the right frontal lobe with a mild-to-moderate amount of curvilinear increased attenuation within this region, as described above. Given the patient's history of oligodendroglioma, this may represent areas of post treatment mineralization and calcification. A small amount of acute hemorrhage cannot completely be excluded. MRI correlation is recommended. 2. Generalized cerebral atrophy and microvascular disease changes of the supratentorial brain. 3. Marked severity left ethmoid sinus  disease.  Brain MRI without contrast revealed the following: 1. No acute intracranial abnormality. 2. Previously described subcentimeter areas of diffusion signal abnormality (seen on recent brain MRI from 09/07/2023) involving the anterior genu of the corpus callosum, right insula, and mesial left temporal lobe are slightly decreased in prominence from prior, likely reflecting evolving subacute infarcts. No associated hemorrhage or mass effect. No areas of new or interval infarction. 3. Additional 7 mm focus of diffusion signal abnormality at the right cerebral peduncle is relatively stable, and remains indeterminate, possibly reflecting ischemic change and/or tumor involvement. Attention at follow-up recommended. 4. Multifocal signal abnormality involving the right frontal and temporal lobes, consistent with known brain tumor. Overall appearance is not significantly changed, with no evidence for new or interval hemorrhage by MRI. 5. Underlying atrophy with chronic microvascular ischemic disease, with a few scattered remote lacunar infarcts about the pons.  Assessment/plan: 1.  Evolving subacute infarctions in the anterior genu of corpus callosum, right insula and medial left temporal lobe that are decreased in prominence from 09/07/2023 and oligodendroglioma with seizure disorder and hypoglycemia.  - The patient is placed on seizure precautions with - We will place on as needed IV Ativan.  He was given 2 mg of IV Ativan with resolution of his seizure. - Fingerstick blood glucose came back 67 indicate hypoglycemia and he was given an ampule of D50. -We will follow neurochecks every 4 hours for 24 hours. - We will continue workup for CVA. Will continue to closely monitor the patient.  Authorized and performed by: Valente David, MD Total critical care time:   35     minutes. Due to a high probability of clinically significant, life-threatening deterioration, the patient required my highest  level of preparedness to intervene emergently and  I personally spent this critical care time directly and personally managing the patient.  This critical care time included obtaining a history, examining the patient, pulse oximetry, ordering and review of studies, arranging urgent treatment with development of management plan, evaluation of patient's response to treatment, frequent reassessment, and discussions with other providers. This critical care time was performed to assess and manage the high probability of imminent, life-threatening deterioration that could result in multiorgan failure.  It was exclusive of separately billable procedures and treating other patients and teaching time.

## 2023-09-10 NOTE — Evaluation (Signed)
 Occupational Therapy Evaluation Patient Details Name: Alan Wise MRN: [id number] DOB: 22-Mar-1971 Today's Date: 09/10/2023   History of Present Illness   Pt is a 53 y.o. male presenting to hospital 09/09/23 with c/o AMS, chest pain, and aphasia; concern of stroke on MRI (recent L facial weakness and slurred speech).  Pt admitted for stroke work-up.  PMH includes oligodendroglioma, brain tumor with necrosis, h/o CVA with aphasia, DM, seizure disorder.     Clinical Impressions Pt seen for OT/PT co-evaluation this date.  Prior to recent medical concerns, pt reports being independent with ambulation; lives with his wife in 2 level home (bedroom 2nd floor); recent fall off of a ladder.  1/10 HA reported during session.  Pt with h/o visual impairments on L but per pt and pt's wife it has been gradually worsening since this past June with progression in L peripheral vision loss. Pt also with h/o aphasia.  Pt required SBA for bed mobility, MIN A progressing to CGA with transfers and CGA to ambulate in hallway (no AD use); 2nd assist for safety/lines.  Pt required MIN-MOD A to complete LB dressing including pants and shoes from elevated bed. Pt would currently benefit from skilled OT services to address noted impairments and functional limitations (see below for any additional details).  Upon hospital discharge, pt would benefit from ongoing therapy.     If plan is discharge home, recommend the following:   A little help with walking and/or transfers;A little help with bathing/dressing/bathroom;Assistance with cooking/housework;Assist for transportation     Functional Status Assessment   Patient has had a recent decline in their functional status and demonstrates the ability to make significant improvements in function in a reasonable and predictable amount of time.     Equipment Recommendations   None recommended by OT     Recommendations for Other Services          Precautions/Restrictions   Precautions Precautions: Fall Restrictions Weight Bearing Restrictions Per Provider Order: No     Mobility Bed Mobility Overal bed mobility: Needs Assistance Bed Mobility: Supine to Sit, Sit to Supine     Supine to sit: Supervision, HOB elevated Sit to supine: Min assist   General bed mobility comments: SBA semi-supine to sitting EOB; min assist for B LE's sit to semi-supine in bed; increased effort for pt to perform as much as possible on own    Transfers Overall transfer level: Needs assistance Equipment used: None Transfers: Sit to/from Stand Sit to Stand: Min assist, Contact guard assist, From elevated surface           General transfer comment: min assist 1st trial standing d/t posterior lean (assist to steady); CGA 2nd trial standing (pt appearing steady and balanced); 2nd assist for safety      Balance Overall balance assessment: Needs assistance Sitting-balance support: No upper extremity supported, Feet supported Sitting balance-Leahy Scale: Good Sitting balance - Comments: steady reaching within BOS   Standing balance support: No upper extremity supported, During functional activity Standing balance-Leahy Scale: Good                             ADL either performed or assessed with clinical judgement   ADL Overall ADL's : Needs assistance/impaired                     Lower Body Dressing: Sit to/from stand;Moderate assistance;Minimal assistance Lower Body Dressing Details (indicate cue type and reason):  don shoes, complete donning pants over hips in standing from tall bed, VC to pull foot through to he doesn't slip on the pant leg                     Vision Ability to See in Adequate Light: 1 Impaired Patient Visual Report: Peripheral vision impairment;Other (comment) (pt/spouse report progression) Vision Assessment?: Yes;Vision impaired- to be further tested in functional context Eye  Alignment: Within Functional Limits Alignment/Gaze Preference: Within Defined Limits Visual Fields: Left homonymous hemianopsia     Perception         Praxis         Pertinent Vitals/Pain Pain Assessment Pain Assessment: 0-10 Pain Score: 1  Pain Location: frontal HA Pain Descriptors / Indicators: Headache Pain Intervention(s): Limited activity within patient's tolerance, Monitored during session, Repositioned     Extremity/Trunk Assessment Upper Extremity Assessment Upper Extremity Assessment: Left hand dominant;RUE deficits/detail;LUE deficits/detail RUE Deficits / Details: grossly 4-/5 LUE Deficits / Details: grossly 4-/5, hx shoulder impairments limiting shoulder flexion to ~90*   Lower Extremity Assessment Lower Extremity Assessment: Defer to PT evaluation RLE Deficits / Details: hip flexion 4+/5; knee flexion/extension and DF 4+/5 LLE Deficits / Details: hip flexion 4+/5; knee flexion/extension and DF 4+/5   Cervical / Trunk Assessment Cervical / Trunk Assessment: Normal   Communication Communication Communication: Impaired Factors Affecting Communication: Difficulty expressing self (h/o aphasia)   Cognition Arousal: Alert Behavior During Therapy: WFL for tasks assessed/performed                                 Following commands: Intact       Cueing  General Comments   Cueing Techniques: Verbal cues      Exercises Other Exercises Other Exercises: Pt/family educated in compensatory strategies for visual impairments   Shoulder Instructions      Home Living Family/patient expects to be discharged to:: Private residence Living Arrangements: Spouse/significant other Available Help at Discharge: Family;Available PRN/intermittently Type of Home: House Home Access: Stairs to enter [employer] of Steps: 4 Entrance Stairs-Rails: Right;Left Home Layout: Two level;Bed/bath upstairs;Full bath on main level;Able to live on main level  with bedroom/bathroom Alternate Level Stairs-Number of Steps: flight of steps to 2nd floor bedroom Alternate Level Stairs-Rails: Right Bathroom Shower/Tub: Producer, television/film/video: Standard     Home Equipment: Shower seat - built in;Hand held shower head          Prior Functioning/Environment               Mobility Comments: Independent with ambulation.  Recent fall off of ladder this past Wednesday. ADLs Comments: Independent with showers and dressing    OT Problem List: Decreased strength;Decreased coordination;Decreased cognition;Decreased knowledge of use of DME or AE;Impaired vision/perception   OT Treatment/Interventions: Self-care/ADL training;Therapeutic exercise;Therapeutic activities;Neuromuscular education;Visual/perceptual remediation/compensation;DME and/or AE instruction;Patient/family education      OT Goals(Current goals can be found in the care plan section)   Acute Rehab OT Goals Patient Stated Goal: get better OT Goal Formulation: With patient/family Time For Goal Achievement: 09/24/23 Potential to Achieve Goals: Good ADL Goals Additional ADL Goal #1: Pt will utilize learned visual compensatory strategies for promoting visual attention to R side to support safety/indep during ADL/IADL tasks, 3/3 opportunities. Additional ADL Goal #2: Pt will complete all aspects of a shower, in sitting primarily, with supervision assist and PRN VC for safety, 1/1 opportunity.  OT Frequency:  Min 2X/week    Co-evaluation PT/OT/SLP Co-Evaluation/Treatment: Yes Reason for Co-Treatment: For patient/therapist safety;To address functional/ADL transfers PT goals addressed during session: Mobility/safety with mobility OT goals addressed during session: ADL's and self-care      AM-PAC OT "6 Clicks" Daily Activity     Outcome Measure Help from another person eating meals?: None Help from another person taking care of personal grooming?: A Little Help from  another person toileting, which includes using toliet, bedpan, or urinal?: A Little Help from another person bathing (including washing, rinsing, drying)?: A Little Help from another person to put on and taking off regular upper body clothing?: A Little Help from another person to put on and taking off regular lower body clothing?: A Little 6 Click Score: 19   End of Session Equipment Utilized During Treatment: Gait belt  Activity Tolerance: Patient tolerated treatment well Patient left: in bed;with call bell/phone within reach;with bed alarm set;with nursing/sitter in room;with family/visitor present  OT Visit Diagnosis: Other abnormalities of gait and mobility (R26.89);Low vision, both eyes (H54.2)                Time: 0981-1914 OT Time Calculation (min): 44 min Charges:  OT General Charges $OT Visit: 1 Visit OT Evaluation $OT Eval Low Complexity: 1 Low OT Treatments $Self Care/Home Management : 8-22 mins  Arman Filter., MPH, MS, OTR/L ascom [phone] 09/10/23, 3:16 PM

## 2023-09-10 NOTE — Progress Notes (Signed)
   [facility] has been requested to perform a transesophageal echocardiogram on Alan Wise for recurrent strokes.  After careful review of history and examination, the risks and benefits of transesophageal echocardiogram have been explained including risks of esophageal damage, perforation (1:10,000 risk), bleeding, pharyngeal hematoma as well as other potential complications associated with conscious sedation including aspiration, arrhythmia, respiratory failure and death. Alternatives to treatment were discussed, questions were answered. Patient is willing to proceed and is tentatively scheduled for 3/21 @ 7:30 AM with Dr. Azucena Cecil.  Nicolasa Ducking, NP  09/10/2023 4:00 PM

## 2023-09-10 NOTE — Evaluation (Signed)
 Physical Therapy Evaluation Patient Details Name: Alan Wise MRN: [id number] DOB: [birth year] Today's Date: 09/10/2023  History of Present Illness  Pt is a 53 y.o. male presenting to hospital 09/09/23 with c/o AMS, chest pain, and aphasia; concern of stroke on MRI (recent L facial weakness and slurred speech).  Pt admitted for stroke work-up.  PMH includes oligodendroglioma, brain tumor with necrosis, h/o CVA with aphasia, DM, seizure disorder.  Clinical Impression  PT/OT co-evaluation performed.  Prior to recent medical concerns, pt reports being independent with ambulation; lives with his wife in 2 level home (bedroom 2nd floor); recent fall off of a ladder.  1/10 HA reported during session.  Pt with h/o visual impairments on L but per pt and pt's wife it has been gradually worsening since this past June.  Pt also with h/o aphasia.  Currently pt is min assist progressing to CGA with transfers and CGA to ambulate in hallway (no AD use); 2nd assist for safety/lines.  Pt would currently benefit from skilled PT to address noted impairments and functional limitations (see below for any additional details).  Upon hospital discharge, pt would benefit from ongoing therapy.     If plan is discharge home, recommend the following: A little help with walking and/or transfers;A little help with bathing/dressing/bathroom;Assistance with cooking/housework;Assist for transportation;Help with stairs or ramp for entrance   Can travel by private vehicle    Yes    Equipment Recommendations None recommended by PT  Recommendations for Other Services       Functional Status Assessment Patient has had a recent decline in their functional status and demonstrates the ability to make significant improvements in function in a reasonable and predictable amount of time.     Precautions / Restrictions Precautions Precautions: Fall Restrictions Weight Bearing Restrictions Per Provider Order: No       Mobility  Bed Mobility Overal bed mobility: Needs Assistance Bed Mobility: Supine to Sit, Sit to Supine     Supine to sit: Supervision, HOB elevated Sit to supine: Min assist   General bed mobility comments: SBA semi-supine to sitting EOB; min assist for B LE's sit to semi-supine in bed; increased effort for pt to perform as much as possible on own    Transfers Overall transfer level: Needs assistance Equipment used: None Transfers: Sit to/from Stand Sit to Stand: Min assist, Contact guard assist, From elevated surface (unable to lower bed any lower (higher than typical bed height))           General transfer comment: min assist 1st trial standing d/t posterior lean (assist to steady); CGA 2nd trial standing (pt appearing steady and balanced); 2nd assist for safety    Ambulation/Gait Ambulation/Gait assistance: Contact guard assist, +2 safety/equipment (2nd assist for safety/lines) Gait Distance (Feet): 120 Feet Assistive device: None Gait Pattern/deviations: Step-through pattern Gait velocity: decreased     General Gait Details: steady ambulation (including with turning R and L in hallway)  Stairs            Wheelchair Mobility     Tilt Bed    Modified Rankin (Stroke Patients Only)       Balance Overall balance assessment: Needs assistance Sitting-balance support: No upper extremity supported, Feet supported Sitting balance-Leahy Scale: Good Sitting balance - Comments: steady reaching within BOS   Standing balance support: No upper extremity supported, During functional activity Standing balance-Leahy Scale: Good Standing balance comment: steady ambulating without UE support  Pertinent Vitals/Pain Pain Assessment Pain Assessment: 0-10 Pain Score: 1  Pain Location: frontal HA Pain Descriptors / Indicators: Headache Pain Intervention(s): Limited activity within patient's tolerance, Monitored during  session HR 73 bpm at rest beginning of session and increased into mid 90's bpm with activity; SpO2 sats stable on room air.    Home Living Family/patient expects to be discharged to:: Private residence Living Arrangements: Spouse/significant other Available Help at Discharge: Family;Available PRN/intermittently Type of Home: House Home Access: Stairs to enter Entrance Stairs-Rails: Right;Left Entrance Stairs-Number of Steps: 4 Alternate Level Stairs-Number of Steps: flight of steps to 2nd floor bedroom Home Layout: Two level;Bed/bath upstairs;Full bath on main level;Able to live on main level with bedroom/bathroom Home Equipment: Shower seat - built in;Hand held shower head      Prior Function               Mobility Comments: Independent with ambulation.  Recent fall off of ladder this past Wednesday. ADLs Comments: Independent with showers and dressing     Extremity/Trunk Assessment   Upper Extremity Assessment Upper Extremity Assessment: Defer to OT evaluation    Lower Extremity Assessment Lower Extremity Assessment: RLE deficits/detail;LLE deficits/detail (intact B LE light touch and tone; mild decreased quality heel to shin coordination R compared to L) RLE Deficits / Details: hip flexion 4+/5; knee flexion/extension and DF 4+/5 LLE Deficits / Details: hip flexion 4+/5; knee flexion/extension and DF 4+/5    Cervical / Trunk Assessment Cervical / Trunk Assessment: Normal  Communication   Communication Communication: Impaired Factors Affecting Communication: Difficulty expressing self (h/o aphasia)    Cognition Arousal: Alert Behavior During Therapy: WFL for tasks assessed/performed   PT - Cognitive impairments: No apparent impairments                       PT - Cognition Comments: Difficulty with finding words d/t h/o aphasia Following commands: Intact       Cueing Cueing Techniques: Verbal cues     General Comments  Nursing cleared pt for  participation in physical therapy.  Pt agreeable to PT session.  Pt's wife present beginning/end of session (stepped outside door during session).    Exercises     Assessment/Plan    PT Assessment Patient needs continued PT services  PT Problem List Decreased strength;Decreased balance;Decreased mobility       PT Treatment Interventions DME instruction;Gait training;Stair training;Functional mobility training;Therapeutic activities;Therapeutic exercise;Balance training;Patient/family education    PT Goals (Current goals can be found in the Care Plan section)  Acute Rehab PT Goals Patient Stated Goal: to improve functional mobility PT Goal Formulation: With patient/family Time For Goal Achievement: 09/24/23 Potential to Achieve Goals: Good    Frequency Min 2X/week     Co-evaluation PT/OT/SLP Co-Evaluation/Treatment: Yes Reason for Co-Treatment: For patient/therapist safety;To address functional/ADL transfers PT goals addressed during session: Mobility/safety with mobility OT goals addressed during session: ADL's and self-care       AM-PAC PT "6 Clicks" Mobility  Outcome Measure Help needed turning from your back to your side while in a flat bed without using bedrails?: None Help needed moving from lying on your back to sitting on the side of a flat bed without using bedrails?: A Little Help needed moving to and from a bed to a chair (including a wheelchair)?: A Little Help needed standing up from a chair using your arms (e.g., wheelchair or bedside chair)?: A Little Help needed to walk in hospital room?: A Little Help needed  climbing 3-5 steps with a railing? : A Little 6 Click Score: 19    End of Session Equipment Utilized During Treatment: Gait belt Activity Tolerance: Patient tolerated treatment well Patient left: in bed;with call bell/phone within reach;with nursing/sitter in room;with family/visitor present Nurse Communication: Mobility status;Precautions PT Visit  Diagnosis: Other abnormalities of gait and mobility (R26.89);History of falling (Z91.81)    Time: 4098-1191 PT Time Calculation (min) (ACUTE ONLY): 44 min   Charges:   PT Evaluation $PT Eval Low Complexity: 1 Low PT Treatments $Therapeutic Activity: 8-22 mins PT General Charges $$ ACUTE PT VISIT: 1 Visit        Hendricks Limes, PT 09/10/23, 12:12 PM

## 2023-09-10 NOTE — Evaluation (Addendum)
 Clinical/Bedside Swallow ans Speech and Language Evaluation Patient Details  Name: Alan Wise MRN: [id number] Date of Birth: Mar 31, 1971  Today's Date: 09/10/2023 Time: SLP Start Time (ACUTE ONLY): 0930 SLP Stop Time (ACUTE ONLY): 1000 SLP Time Calculation (min) (ACUTE ONLY): 30 min  Past Medical History:  Past Medical History:  Diagnosis Date   Allergy    Complication of anesthesia    pt reports he is starting to have more difficulty arousing after surgery   Diabetes mellitus (HCC)    GERD (gastroesophageal reflux disease)    Headache    History of kidney stones    Hyperlipidemia    Renal disorder    kidney stones   Past Surgical History:  Past Surgical History:  Procedure Laterality Date   APPLICATION OF CRANIAL NAVIGATION Right 01/17/2021   Procedure: APPLICATION OF CRANIAL NAVIGATION;  Surgeon: Bedelia Person, MD;  Location: [facility] OR;  Service: Neurosurgery;  Laterality: Right;   COLONOSCOPY  09/03/1994   COLONOSCOPY WITH PROPOFOL N/A 01/07/2023   Procedure: COLONOSCOPY WITH PROPOFOL;  Surgeon: [name], MD;  Location: [facility] ENDOSCOPY;  Service: Endoscopy;  Laterality: N/A;  NOT TOO EARLY   CYSTOSCOPY WITH STENT PLACEMENT Right 01/25/2016   Procedure: CYSTOSCOPY WITH STENT PLACEMENT;  Surgeon: Hildred Laser, MD;  Location: [facility] ORS;  Service: Urology;  Laterality: Right;   FRAMELESS  BIOPSY WITH BRAINLAB Right 01/17/2021   Procedure: RIGHT STEREOTACTIC BIOPSY OF INSULAR LESION;  Surgeon: Bedelia Person, MD;  Location: [facility] OR;  Service: Neurosurgery;  Laterality: Right;   KNEE ARTHROSCOPY W/ MENISCAL REPAIR Right 06/23/1988   POLYPECTOMY  01/07/2023   Procedure: POLYPECTOMY;  Surgeon: [name], MD;  Location: [facility] ENDOSCOPY;  Service: Endoscopy;;   removal of birthmark  06/08/2013   SKIN CANCER EXCISION  06/23/2012   TYMPANOSTOMY TUBE PLACEMENT  08/06/1978   URETEROSCOPY WITH HOLMIUM LASER LITHOTRIPSY Right 01/25/2016   Procedure: URETEROSCOPY WITH  HOLMIUM LASER LITHOTRIPSY;  Surgeon: Hildred Laser, MD;  Location: [facility] ORS;  Service: Urology;  Laterality: Right;   URETHRAL STRICTURE DILATATION     visual inspection of vocal cord     1973   WISDOM TOOTH EXTRACTION     HPI:  Per neurology consult: Alan Wise is a 53 y.o. male with hx of oligodendroglioma followed by Dr. Barbaraann Cao who is currently undergoing treatment with Avastin.  He was in his normal state of health when he went up to stay with his parents last week, and then on Thursday apparently developed left facial weakness as well as slurred speech.  For this an MRI was obtained yesterday which showed multiple areas of restricted diffusion concerning for acute ischemia and therefore the patient was referred to the emergency department for further workup. MRI 09/09/23:  No acute intracranial abnormality.  2. Previously described subcentimeter areas of diffusion signal  abnormality (seen on recent brain MRI from 09/07/2023) involving the  anterior genu of the corpus callosum, right insula, and mesial left  temporal lobe are slightly decreased in prominence from prior,  likely reflecting evolving subacute infarcts. No associated  hemorrhage or mass effect. No areas of new or interval infarction.  3. Additional 7 mm focus of diffusion signal abnormality at the  right cerebral peduncle is relatively stable, and remains  indeterminate, possibly reflecting ischemic change and/or tumor  involvement. Attention at follow-up recommended.  4. Multifocal signal abnormality involving the right frontal and  temporal lobes, consistent with known brain tumor. Overall  appearance is not significantly changed, with  no evidence for new or  interval hemorrhage by MRI.  5. Underlying atrophy with chronic microvascular ischemic disease,  with a few scattered remote lacunar infarcts about the pons. DG Chest 09/09/23: No active disease.    Assessment / Plan / Recommendation  Clinical Impression  Pt seen for  bedside evaluation in the setting of stroke work up and spouse reported concern for increase in choking. Pt alert. Pt with mild left facial weakness. Pt seen with trials of thin liquid via cup and straw and regular solids. Pt initial cough with thin liquids not repeated with additional trials. Pt with complete oral phase manipulation and clearance with repeated regular solids trials. Pt's spouse reported similar intermittent coughing at home, but denied recent PNA. Vital signs remained stable for duration of session (O2 saturations maintained at 94 and greater on room air).   Given chronic/acute neuro status, pt is at increased risk for aspiration. Recommend trial initiation of regular solids and thin liquids with aspiration precautions (slow rate, small bites, elevated HOB, and alert for PO intake) Supervision for PO intake to ensure pausing and recovery in the setting of cough onset. Education shared with spouse regarding recommendations and precautions. MD and RN aware of recommendations.  Regarding speech and language, pt with baseline language deficits, per most recent OP speech therapy assessment 08/24/23, "moderate aphasia c/b empty, vague language, halting for word finding difficulties. Auditory comprehension impairments noted on QAB as well as in conversation gathering history. Written expression impaired at phrase level, spouse endorses texts often don't make sense. "He texts like he speaks". Due to aphasia, Alan Wise plans to retire at the end of this semester. 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 He had difficulty stating spouse and children's names and his address. He endorses significant frustration.  They do endorse some attempts at multimodal communication with inconsistent success. Spouse states "I can't listen to what he says, I have to look at my phone to try to understand him. I recommend skilled ST to maximize communication for safety, to reduce caregiver  burden and improve QOL."  Per spouse, pt had an episode of increased language deficits vs altered cognition leading up to seizure yesterday, c/b inability to state name/spouses name and increased short term memory loss. Spouse endorses some resolution in acute concerns this AM, though pt is still drowsy secondary to limited sleep.   Today, assessment consisted of dynamic assessment and pt/spouse interview. Pt presents with similar language deficits as those noted by SLP on 3/3. Verbal expression notable for fluent speech, with empty/vague content at conversational level. Verbal expression tangential at times (regarding discomfort in bed and frustration with weakness), with pt redirected with repositioning and verbal cues. Pt able to follow contextual commands with visual/tactile cues. Pt reporting frustration with speech, indicating increased challenge with language at end of day. Education shared regarding fluctuations in language based on fatigue, medical status, sleep, situation, etc. Spouse reported understanding. Recommend provision of visual/verbal cues and response verification for clarification of response.   Pt to benefit from follow up SLP services with expectation of benefit from return to OP services.   SLP Visit Diagnosis: Dysphagia, oropharyngeal phase (R13.12);Aphasia (R47.01)    Aspiration Risk  Mild aspiration risk    Diet Recommendation   Thin;Age appropriate regular  Medication Administration: Whole meds with liquid    Other  Recommendations Oral Care Recommendations: Oral care BID    Recommendations for follow up therapy are one component of a multi-disciplinary  discharge planning process, led by the attending physician.  Recommendations may be updated based on patient status, additional functional criteria and insurance authorization.  Follow up Recommendations Follow physician's recommendations for discharge plan and follow up therapies      Assistance Recommended at  Discharge    Functional Status Assessment Patient has had a recent decline in their functional status and demonstrates the ability to make significant improvements in function in a reasonable and predictable amount of time.  Frequency and Duration min 2x/week  1 week       Prognosis Prognosis for improved oropharyngeal function: Fair Barriers to Reach Goals: Other (Comment);Time post onset (neuro progression)      Swallow Study   General Date of Onset: 09/10/23 HPI: Per neurology consult: Alan Wise is a 53 y.o. male with hx of oligodendroglioma followed by Dr. Barbaraann Cao who is currently undergoing treatment with Avastin.  He was in his normal state of health when he went up to stay with his parents last week, and then on Thursday apparently developed left facial weakness as well as slurred speech.  For this an MRI was obtained yesterday which showed multiple areas of restricted diffusion concerning for acute ischemia and therefore the patient was referred to the emergency department for further workup. MRI 09/09/23:  No acute intracranial abnormality.  2. Previously described subcentimeter areas of diffusion signal  abnormality (seen on recent brain MRI from 09/07/2023) involving the  anterior genu of the corpus callosum, right insula, and mesial left  temporal lobe are slightly decreased in prominence from prior,  likely reflecting evolving subacute infarcts. No associated  hemorrhage or mass effect. No areas of new or interval infarction.  3. Additional 7 mm focus of diffusion signal abnormality at the  right cerebral peduncle is relatively stable, and remains  indeterminate, possibly reflecting ischemic change and/or tumor  involvement. Attention at follow-up recommended.  4. Multifocal signal abnormality involving the right frontal and  temporal lobes, consistent with known brain tumor. Overall  appearance is not significantly changed, with no evidence for new or  interval hemorrhage by MRI.   5. Underlying atrophy with chronic microvascular ischemic disease,  with a few scattered remote lacunar infarcts about the pons. DG Chest 09/09/23: No active disease. Type of Study: Bedside Swallow Evaluation Previous Swallow Assessment: none in chart Diet Prior to this Study: NPO Temperature Spikes Noted: No Respiratory Status: Room air History of Recent Intubation: No Behavior/Cognition: Alert;Cooperative Oral Cavity Assessment: Within Functional Limits Oral Care Completed by SLP: Yes Oral Cavity - Dentition: Adequate natural dentition Vision: Functional for self-feeding Self-Feeding Abilities: Able to feed self (slow with use of non dominant hand) Patient Positioning: Upright in bed Baseline Vocal Quality: Normal Volitional Cough: Cognitively unable to elicit Volitional Swallow: Unable to elicit    Oral/Motor/Sensory Function Overall Oral Motor/Sensory Function: Mild impairment (slow facial movement, though potentially impacted by limited command following- left facial weakness)   Ice Chips Ice chips: Not tested   Thin Liquid Thin Liquid: Impaired Presentation: Cup;Straw Oral Phase Impairments:  (none) Oral Phase Functional Implications:  (none) Pharyngeal  Phase Impairments: Cough - Immediate (x1/5)    Nectar Thick Nectar Thick Liquid: Not tested   Honey Thick Honey Thick Liquid: Not tested   Puree Puree: Not tested   Solid     Solid: Within functional limits Presentation: Self Fed     Alan Dakota Vanwart Clapp, MS, CCC-SLP Speech Language Pathologist Rehab Services; [facility] [place] [phone] (ascom)   Alan Wise  09/10/2023,12:46 PM

## 2023-09-10 NOTE — Progress Notes (Signed)
 PROGRESS NOTE    Alan Wise  UXL:[id number] DOB: [birth year] DOA: 09/09/2023 PCP: Lorre Munroe, NP    Brief Narrative:  52 year old male with history of seizure, brain tumor with necrosis, history of CVA with aphasia, presents to the emergency department for chief concerns of stroke on MRI.   Outpatient MRI brain on 09/07/2023: Read as foci of restricted diffusion and T2 FLAIR hyperintense signal abnormality within the right cerebral peduncle, anterior left temporal lobe, colossal genu and right insula, as described and new from prior MRI on 07/06/2023.   ED treatment: None.  EDP discussed with neurology, Dr. Amada Jupiter who recommends hospital admission and to follow direction from Dr. Barbaraann Cao, medical oncology note on 09/08/2023. At bedside, patient is able to tell me his first and last name, age, current location, current calendar year.   He reports decreasing activity over the last week. He reports baseline difficulty speaking, which is unchanged.   Last week, he went on Spring Break to spend time with his parents. On weDnesday, he fell from two step ladder and pt did not provide much information.   On Thursday, mother noticed that patient had a facial droop.   On Friday, he came home and spouse reports that she was concerned about a stroke and called Dr. Barbaraann Cao.   Family reports that she noticed facial droop and slurred speech which is new for him.  At bedside, she states that this has improved.   Patient has left sided neglect/decreased visual field.  3/20: Case discussed with neurology Dr. Amada Jupiter.  Patient apparently had a seizure per family lasting 20 minutes 3/19 p.m.  Broke with Ativan.  Case was discussed with stroke specialist at [facility].  Cryptogenic stroke of atypical vascular distribution.  Neurology recommending TEE and loop recorder.  Cardiology and EP involved.   Assessment & Plan:   Principal Problem:   Stroke [facility]) Active Problems:    Oligodendroglioma (HCC)   Status epilepticus (HCC)   Type 2 diabetes mellitus with hyperglycemia (HCC)   Hyperlipidemia associated with type 2 diabetes mellitus (HCC)   Focal seizures (HCC)   Essential hypertension   Seizure disorder (HCC)   Uncontrolled type 2 diabetes mellitus with hypoglycemia, with long-term current use of insulin (HCC)   * Stroke [facility]) Neurology consulted.  Case discussed with stroke specialist at [facility].  Cryptogenic stroke of atypical vascular distribution.  TTE reassuring.  Out of window for permissive hypertension.  Tolerating p.o. intake.  Diet advanced per SLP. Plan: Okay for diet Cardiology, to acted for neurology recommendations.  Patient will need TEE EP consulted.  Patient will need loop recorder Frequent neuro checks Fall and aspiration precautions Continue PT and OT  Status epilepticus (HCC) Breakthrough seizures Breakthrough seizure noted 3/19.   Home Lamictal dose was 150 mg twice daily.   Increased per neurology recommendations.   Essential hypertension Patient is outside window for permissive hypertension Home amlodipine 5 mg daily resumed Home propranolol 20 mg p.o. twice daily resumed on admission Hydralazine 5 mg IV every 6 hours as needed for SBP greater than 165, 5 days ordered   Hyperlipidemia associated with type 2 diabetes mellitus (HCC) Atorvastatin 40 mg daily initiated on admission   Type 2 diabetes mellitus with hyperglycemia (HCC) Insulin SSI with at bedtime coverage ordered Goal inpatient blood glucose levels 140-180  DVT prophylaxis: TED hose Code Status: Full Family Communication: Spouse and mother at bedside 3/20 Disposition Plan: Status is: Observation The patient will require care spanning > 2 midnights  and should be moved to inpatient because: Acute cryptogenic CVA.  Breakthrough seizures in the setting of status epilepticus.  Neurology evaluation continuing.  Patient will need TEE and loop recorder prior to  discharge.   Level of care: Telemetry Medical  Consultants:  Neurology EP  Procedures:  TEE plan 3/21  Antimicrobials: None   Subjective: Seen and examined flattened affect but overall stable.  Ambulating with therapy.  Objective: Vitals:   09/10/23 0900 09/10/23 1000 09/10/23 1030 09/10/23 1100  BP: (!) 141/89 (!) 145/124  (!) 162/92  Pulse: 64 73 96 79  Resp:  16  17  Temp:    (!) 97.4 F (36.3 C)  TempSrc:    Oral  SpO2: 95% 95% 94% 95%    Intake/Output Summary (Last 24 hours) at 09/10/2023 1303 Last data filed at 09/10/2023 0315 Gross per 24 hour  Intake --  Output 300 ml  Net -300 ml   There were no vitals filed for this visit.  Examination:  General exam: Appears frail Respiratory system: Clear to auscultation. Respiratory effort normal. Cardiovascular system: S1-S2, RRR, no murmurs, no pedal edema Gastrointestinal system: Thin, soft, NT/ND, normal bowel sounds Central nervous system: Alert and oriented. No focal neurological deficits. Extremities: Decreased power bilateral lower extremities Skin: No rashes, lesions or ulcers Psychiatry: Judgement and insight appear impaired. Mood & affect flattened.     Data Reviewed: I have personally reviewed following labs and imaging studies  CBC: Recent Labs  Lab 09/07/23 1218 09/09/23 1240  WBC 8.7 7.1  NEUTROABS 5.9  --   HGB 15.4 15.8  HCT 43.8 44.3  MCV 82.6 83.0  PLT 195 210   Basic Metabolic Panel: Recent Labs  Lab 09/09/23 1240  NA 138  K 4.2  CL 103  CO2 27  GLUCOSE 175*  BUN 15  CREATININE 0.95  CALCIUM 9.3   GFR: Estimated Creatinine Clearance: 87.4 mL/min (by C-G formula based on SCr of 0.95 mg/dL). Liver Function Tests: No results for input(s): "AST", "ALT", "ALKPHOS", "BILITOT", "PROT", "ALBUMIN" in the last 168 hours. No results for input(s): "LIPASE", "AMYLASE" in the last 168 hours. No results for input(s): "AMMONIA" in the last 168 hours. Coagulation Profile: No results  for input(s): "INR", "PROTIME" in the last 168 hours. Cardiac Enzymes: No results for input(s): "CKTOTAL", "CKMB", "CKMBINDEX", "TROPONINI" in the last 168 hours. BNP (last 3 results) No results for input(s): "PROBNP" in the last 8760 hours. HbA1C: No results for input(s): "HGBA1C" in the last 72 hours. CBG: Recent Labs  Lab 09/09/23 2008 09/09/23 2041 09/10/23 0318 09/10/23 0440 09/10/23 0940  GLUCAP 67* 205* 75 84 82   Lipid Profile: Recent Labs    09/10/23 0614  CHOL 207*  HDL 28*  LDLCALC 156*  TRIG 116  CHOLHDL 7.4   Thyroid Function Tests: No results for input(s): "TSH", "T4TOTAL", "FREET4", "T3FREE", "THYROIDAB" in the last 72 hours. Anemia Panel: No results for input(s): "VITAMINB12", "FOLATE", "FERRITIN", "TIBC", "IRON", "RETICCTPCT" in the last 72 hours. Sepsis Labs: No results for input(s): "PROCALCITON", "LATICACIDVEN" in the last 168 hours.  No results found for this or any previous visit (from the past 240 hours).       Radiology Studies: ECHOCARDIOGRAM COMPLETE Result Date: 09/10/2023    ECHOCARDIOGRAM REPORT   Patient Name:   Alan Wise Date of Exam: 09/10/2023 Medical Rec #:  [id number]         Height:       68.0 in Accession #:    [id number]  Weight:       148.1 lb Date of Birth:  Jul 19, 1970        BSA:          1.799 m Patient Age:    52 years          BP:           138/81 mmHg Patient Gender: M                 HR:           55 bpm. Exam Location:  [facility] Procedure: 2D Echo, Cardiac Doppler, Color Doppler and Saline Contrast Bubble            Study (Both Spectral and Color Flow Doppler were utilized during            procedure). Indications:     Stroke I63.9  History:         Patient has prior history of Echocardiogram examinations, most                  recent 01/15/2022. Risk Factors:Diabetes and Dyslipidemia.  Sonographer:     Cristela Blue Referring Phys:  [id number] AMY N [facility] Diagnosing Phys: Julien Nordmann MD  Sonographer Comments: No parasternal  window and no subcostal window. IMPRESSIONS  1. Left ventricular ejection fraction, by estimation, is 55 to 60%. The left ventricle has normal function. The left ventricle has no regional wall motion abnormalities. Left ventricular diastolic parameters are consistent with Grade I diastolic dysfunction (impaired relaxation).  2. Right ventricular systolic function is normal. The right ventricular size is normal. There is normal pulmonary artery systolic pressure. The estimated right ventricular systolic pressure is 25.8 mmHg.  3. The mitral valve is normal in structure. No evidence of mitral valve regurgitation. No evidence of mitral stenosis.  4. The aortic valve is normal in structure. Aortic valve regurgitation is not visualized. No aortic stenosis is present.  5. The inferior vena cava is normal in size with greater than 50% respiratory variability, suggesting right atrial pressure of 3 mmHg.  6. Agitated saline contrast bubble study was negative, with no evidence of any interatrial shunt. FINDINGS  Left Ventricle: Left ventricular ejection fraction, by estimation, is 55 to 60%. The left ventricle has normal function. The left ventricle has no regional wall motion abnormalities. Strain was performed and the global longitudinal strain is indeterminate. The left ventricular internal cavity size was normal in size. There is no left ventricular hypertrophy. Left ventricular diastolic parameters are consistent with Grade I diastolic dysfunction (impaired relaxation). Right Ventricle: The right ventricular size is normal. No increase in right ventricular wall thickness. Right ventricular systolic function is normal. There is normal pulmonary artery systolic pressure. The tricuspid regurgitant velocity is 2.28 m/s, and  with an assumed right atrial pressure of 5 mmHg, the estimated right ventricular systolic pressure is 25.8 mmHg. Left Atrium: Left atrial size was normal in size. Right Atrium: Right atrial size was  normal in size. Pericardium: There is no evidence of pericardial effusion. Mitral Valve: The mitral valve is normal in structure. No evidence of mitral valve regurgitation. No evidence of mitral valve stenosis. MV peak gradient, 2.2 mmHg. The mean mitral valve gradient is 1.0 mmHg. Tricuspid Valve: The tricuspid valve is normal in structure. Tricuspid valve regurgitation is not demonstrated. No evidence of tricuspid stenosis. Aortic Valve: The aortic valve is normal in structure. Aortic valve regurgitation is not visualized. No aortic stenosis is present. Aortic valve mean gradient measures  2.0 mmHg. Aortic valve peak gradient measures 4.0 mmHg. Aortic valve area, by VTI measures 3.01 cm. Pulmonic Valve: The pulmonic valve was normal in structure. Pulmonic valve regurgitation is not visualized. No evidence of pulmonic stenosis. Aorta: The aortic root is normal in size and structure. Venous: The inferior vena cava is normal in size with greater than 50% respiratory variability, suggesting right atrial pressure of 3 mmHg. IAS/Shunts: No atrial level shunt detected by color flow Doppler. Agitated saline contrast was given intravenously to evaluate for intracardiac shunting. Agitated saline contrast bubble study was negative, with no evidence of any interatrial shunt. There  is no evidence of a patent foramen ovale. There is no evidence of an atrial septal defect. Additional Comments: 3D was performed not requiring image post processing on an independent workstation and was indeterminate.  LEFT VENTRICLE PLAX 2D LVIDd:         4.10 cm   Diastology LVIDs:         2.60 cm   LV e' medial:    5.33 cm/s LV PW:         1.20 cm   LV E/e' medial:  11.8 LV IVS:        1.10 cm   LV e' lateral:   6.85 cm/s LVOT diam:     2.00 cm   LV E/e' lateral: 9.2 LV SV:         52 LV SV Index:   29 LVOT Area:     3.14 cm  RIGHT VENTRICLE RV Basal diam:  2.10 cm RV Mid diam:    2.00 cm RV S prime:     11.60 cm/s TAPSE (M-mode): 1.6 cm LEFT  ATRIUM           Index        RIGHT ATRIUM          Index LA diam:      3.30 cm 1.83 cm/m   RA Area:     7.43 cm LA Vol (A2C): 21.5 ml 11.95 ml/m  RA Volume:   11.30 ml 6.28 ml/m LA Vol (A4C): 13.1 ml 7.28 ml/m  AORTIC VALVE AV Area (Vmax):    2.78 cm AV Area (Vmean):   2.94 cm AV Area (VTI):     3.01 cm AV Vmax:           100.00 cm/s AV Vmean:          59.800 cm/s AV VTI:            0.172 m AV Peak Grad:      4.0 mmHg AV Mean Grad:      2.0 mmHg LVOT Vmax:         88.60 cm/s LVOT Vmean:        56.000 cm/s LVOT VTI:          0.165 m LVOT/AV VTI ratio: 0.96  AORTA Ao Root diam: 2.60 cm MITRAL VALVE               TRICUSPID VALVE MV Area (PHT): 3.58 cm    TR Peak grad:   20.8 mmHg MV Area VTI:   2.50 cm    TR Vmax:        228.00 cm/s MV Peak grad:  2.2 mmHg MV Mean grad:  1.0 mmHg    SHUNTS MV Vmax:       0.75 m/s    Systemic VTI:  0.16 m MV Vmean:      49.3 cm/s  Systemic Diam: 2.00 cm MV Decel Time: 212 msec MV E velocity: 62.90 cm/s MV A velocity: 75.50 cm/s MV E/A ratio:  0.83 Julien Nordmann MD Electronically signed by Julien Nordmann MD Signature Date/Time: 09/10/2023/11:37:44 AM    Final    MR BRAIN WO CONTRAST Result Date: 09/10/2023 CLINICAL DATA:  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 EXAM: MRI HEAD WITHOUT CONTRAST TECHNIQUE: Multiplanar, multiecho pulse sequences of the brain and surrounding structures were obtained without intravenous contrast. COMPARISON:  Comparison made with CT from earlier the same day as well as multiple previous studies, including recent brain MRI from 09/07/2023 FINDINGS: Brain: Cerebral volume loss with mild chronic microvascular ischemic disease, stable. Few scattered superimposed remote lacunar infarcts noted about the pons. Previously noted areas of diffusion signal abnormality involving the anterior genu of the corpus callosum, right insula, and mesial left temporal lobe are slightly decreased in prominence from prior, likely reflecting  small evolving subacute infarcts. No associated hemorrhage or mass effect. Additional 7 mm focus of diffusion signal abnormality at the right cerebral peduncle is relatively stable, and remains indeterminate, possibly reflecting ischemic change and/or tumor involvement. No other new or interval areas of infarction. Multifocal signal abnormality involving the right frontal and temporal lobes, consistent with known brain tumor. Few scattered foci of associated cystic encephalomalacia with associated T2/FLAIR signal. Associated diffusion signal abnormality is relatively stable. Scattered areas of intrinsic precontrast T1 hyperintensity and susceptibility artifact are also not appreciably changed, likely reflecting post treatment changes and/or chronic blood products. No evidence for new intracranial hemorrhage by MRI. Previously noted hyperdensity on prior CT corresponds with these signal changes. Although these changes on CT from earlier today are new as compared to most recent CT from 05/13/2023, these changes not appreciably changed from intervening brain MRIs, with no evidence for new or interval hemorrhage. No other mass lesion or mass effect. Stable ventricular size and morphology without hydrocephalus. No extra-axial fluid collection. Pituitary gland within normal limits. No other intrinsic temporal lobe abnormality. Vascular: Major intracranial vascular flow voids are maintained. Skull and upper cervical spine: Craniocervical junction within normal limits. Bone marrow signal intensity normal. No scalp soft tissue abnormality. Sinuses/Orbits: Globes and orbital soft tissues within normal limits. Mild mucosal thickening present about the ethmoidal air cells and maxillary sinuses, slightly worse on the left. No significant mastoid effusion. Other: None. IMPRESSION: 1. No acute intracranial abnormality. 2. Previously described subcentimeter areas of diffusion signal abnormality (seen on recent brain MRI from  09/07/2023) involving the anterior genu of the corpus callosum, right insula, and mesial left temporal lobe are slightly decreased in prominence from prior, likely reflecting evolving subacute infarcts. No associated hemorrhage or mass effect. No areas of new or interval infarction. 3. Additional 7 mm focus of diffusion signal abnormality at the right cerebral peduncle is relatively stable, and remains indeterminate, possibly reflecting ischemic change and/or tumor involvement. Attention at follow-up recommended. 4. Multifocal signal abnormality involving the right frontal and temporal lobes, consistent with known brain tumor. Overall appearance is not significantly changed, with no evidence for new or interval hemorrhage by MRI. 5. Underlying atrophy with chronic microvascular ischemic disease, with a few scattered remote lacunar infarcts about the pons. Electronically Signed   By: Rise Mu M.D.   On: 09/10/2023 00:51   CT HEAD WO CONTRAST ( ) Result Date: 09/09/2023 CLINICAL DATA:  History of oligodendroglioma, presenting with confusion and chest tightness. EXAM: CT HEAD WITHOUT CONTRAST TECHNIQUE: Contiguous axial images were obtained from the base of the  skull through the vertex without intravenous contrast. RADIATION DOSE REDUCTION: This exam was performed according to the departmental dose-optimization program which includes automated exposure control, adjustment of the mA and/or kV according to patient size and/or use of iterative reconstruction technique. COMPARISON:  May 13, 2023 FINDINGS: Brain: There is generalized cerebral atrophy with widening of the extra-axial spaces and ventricular dilatation. There are areas of decreased attenuation within the white matter tracts of the supratentorial brain, consistent with microvascular disease changes. Post treatment changes are again seen within the right frontal lobe. A mild-to-moderate amount of curvilinear increased attenuation  (approximately 57.92 Hounsfield units) is also seen within this region, along the junction of the cortex and white matter. Mild involvement of the right parietal lobe is noted. There is no evidence of associated mass effect or midline shift. Vascular: Marked severity bilateral cavernous carotid artery calcification is noted. Skull: Normal. Negative for fracture or focal lesion. Sinuses/Orbits: There is marked severity left ethmoid sinus mucosal thickening. Other: None. IMPRESSION: 1. Post treatment changes within the right frontal lobe with a mild-to-moderate amount of curvilinear increased attenuation within this region, as described above. Given the patient's history of oligodendroglioma, this may represent areas of post treatment mineralization and calcification. A small amount of acute hemorrhage cannot completely be excluded. MRI correlation is recommended. 2. Generalized cerebral atrophy and microvascular disease changes of the supratentorial brain. 3. Marked severity left ethmoid sinus disease. Electronically Signed   By: Aram Candela M.D.   On: 09/09/2023 21:11   MR ANGIO NECK W WO CONTRAST Result Date: 09/09/2023 CLINICAL DATA:  Follow-up examination for stroke. EXAM: MRA NECK WITHOUT AND WITH CONTRAST MRA HEAD WITHOUT CONTRAST TECHNIQUE: Multiplanar and multiecho pulse sequences of the neck were obtained without and with intravenous contrast. Angiographic images of the neck were obtained using MRA technique without and with intravenous contrast; Angiographic images of the Circle of Willis were obtained using MRA technique without intravenous contrast. CONTRAST:  6mL GADAVIST GADOBUTROL 1 MMOL/ML IV SOLN COMPARISON:  Comparison made with brain MRI from 09/07/2023. FINDINGS: MRA NECK FINDINGS Aortic arch: Visualized aortic arch within normal limits for caliber with standard 3 vessel morphology. No stenosis about the origin of the great vessels. Right carotid system: Right common and internal carotid  arteries are patent with antegrade flow. No dissection or hemodynamically significant stenosis about the right carotid artery system. Left carotid system: Left common and internal carotid arteries are patent with antegrade flow. No dissection or hemodynamically significant stenosis about the left carotid artery system. Vertebral arteries: Both vertebral arteries arise from subclavian arteries. Vertebral arteries are patent with antegrade flow. No dissection or hemodynamically significant stenosis. Other: None. MRA HEAD FINDINGS Anterior circulation: Both internal carotid arteries are patent to the termini without stenosis or other abnormality. Right A1 segment patent. Left A1 hypoplastic and/or absent. Normal anterior communicating artery complex. Anterior cerebral arteries patent without stenosis. No M1 stenosis or occlusion. Distal MCA branches perfused and symmetric. Posterior circulation: Visualized V4 segments patent without stenosis. Right PICA patent. Left PICA not seen. Basilar patent without stenosis. Superior cerebral arteries patent bilaterally. Fetal type origin of the PCAs bilaterally. Both PCAs patent to their distal aspects without significant stenosis. Anatomic variants: As above. Other: No intracranial aneurysm. IMPRESSION: Normal MRA of the head and neck. No large vessel occlusion, hemodynamically significant stenosis, or other acute vascular abnormality. Electronically Signed   By: Rise Mu M.D.   On: 09/09/2023 20:17   MR ANGIO HEAD WO CONTRAST Result Date: 09/09/2023 CLINICAL DATA:  Follow-up examination for stroke. EXAM: MRA NECK WITHOUT AND WITH CONTRAST MRA HEAD WITHOUT CONTRAST TECHNIQUE: Multiplanar and multiecho pulse sequences of the neck were obtained without and with intravenous contrast. Angiographic images of the neck were obtained using MRA technique without and with intravenous contrast; Angiographic images of the Circle of Willis were obtained using MRA technique  without intravenous contrast. CONTRAST:  6mL GADAVIST GADOBUTROL 1 MMOL/ML IV SOLN COMPARISON:  Comparison made with brain MRI from 09/07/2023. FINDINGS: MRA NECK FINDINGS Aortic arch: Visualized aortic arch within normal limits for caliber with standard 3 vessel morphology. No stenosis about the origin of the great vessels. Right carotid system: Right common and internal carotid arteries are patent with antegrade flow. No dissection or hemodynamically significant stenosis about the right carotid artery system. Left carotid system: Left common and internal carotid arteries are patent with antegrade flow. No dissection or hemodynamically significant stenosis about the left carotid artery system. Vertebral arteries: Both vertebral arteries arise from subclavian arteries. Vertebral arteries are patent with antegrade flow. No dissection or hemodynamically significant stenosis. Other: None. MRA HEAD FINDINGS Anterior circulation: Both internal carotid arteries are patent to the termini without stenosis or other abnormality. Right A1 segment patent. Left A1 hypoplastic and/or absent. Normal anterior communicating artery complex. Anterior cerebral arteries patent without stenosis. No M1 stenosis or occlusion. Distal MCA branches perfused and symmetric. Posterior circulation: Visualized V4 segments patent without stenosis. Right PICA patent. Left PICA not seen. Basilar patent without stenosis. Superior cerebral arteries patent bilaterally. Fetal type origin of the PCAs bilaterally. Both PCAs patent to their distal aspects without significant stenosis. Anatomic variants: As above. Other: No intracranial aneurysm. IMPRESSION: Normal MRA of the head and neck. No large vessel occlusion, hemodynamically significant stenosis, or other acute vascular abnormality. Electronically Signed   By: Rise Mu M.D.   On: 09/09/2023 20:17   DG Chest Port 1 View Result Date: 09/09/2023 CLINICAL DATA:  [id number] Chest pain [id number]  EXAM: PORTABLE CHEST 1 VIEW COMPARISON:  04/11/2023 FINDINGS: Bilateral lung fields are clear. Bilateral costophrenic angles are clear. Normal cardio-mediastinal silhouette. No acute osseous abnormalities. The soft tissues are within normal limits. IMPRESSION: No active disease. Electronically Signed   By: Jules Schick M.D.   On: 09/09/2023 15:09        Scheduled Meds:  amLODipine  5 mg Oral Daily   aspirin EC  81 mg Oral Daily   atorvastatin  40 mg Oral Q2000   cholecalciferol  1,000 Units Oral Daily   insulin aspart  0-15 Units Subcutaneous TID WC   insulin aspart  0-5 Units Subcutaneous QHS   lamoTRIgine  150 mg Oral BID   propranolol  20 mg Oral BID   sodium chloride flush  3-10 mL Intravenous Q12H   Continuous Infusions:   LOS: 0 days    Tresa Moore, MD [facility]   If 7PM-7AM, please contact night-coverage  09/10/2023, 1:03 PM

## 2023-09-11 ENCOUNTER — Other Ambulatory Visit: Payer: Self-pay | Admitting: Cardiology

## 2023-09-11 ENCOUNTER — Encounter
Admission: EM | Disposition: A | Discharge status: Home / Self Care | Payer: Self-pay | Source: Home / Self Care | Attending: Emergency Medicine

## 2023-09-11 ENCOUNTER — Telehealth (HOSPITAL_BASED_OUTPATIENT_CLINIC_OR_DEPARTMENT_OTHER): Payer: Self-pay | Admitting: Internal Medicine

## 2023-09-11 ENCOUNTER — Encounter: Payer: Self-pay | Admitting: Certified Registered Nurse Anesthetist

## 2023-09-11 ENCOUNTER — Telehealth: Payer: Self-pay | Admitting: Cardiology

## 2023-09-11 ENCOUNTER — Inpatient Hospital Stay
Admit: 2023-09-11 | Discharge: 2023-09-11 | Disposition: A | Discharge status: Home / Self Care | Attending: Nurse Practitioner | Admitting: Nurse Practitioner

## 2023-09-11 ENCOUNTER — Ambulatory Visit: Attending: Cardiology

## 2023-09-11 DIAGNOSIS — I639 Cerebral infarction, unspecified: Secondary | ICD-10-CM

## 2023-09-11 LAB — GLUCOSE, CAPILLARY: Glucose-Capillary: 111 mg/dL — ABNORMAL HIGH (ref 70–99)

## 2023-09-11 SURGERY — ECHOCARDIOGRAM, TRANSESOPHAGEAL
Anesthesia: Moderate Sedation

## 2023-09-11 MED ORDER — LIDOCAINE VISCOUS HCL 2 % MT SOLN
OROMUCOSAL | Status: AC
  Filled 2023-09-11: qty 15

## 2023-09-11 MED ORDER — BUTAMBEN-TETRACAINE-BENZOCAINE 2-2-14 % EX AERO
INHALATION_SPRAY | CUTANEOUS | Status: AC
  Filled 2023-09-11: qty 5

## 2023-09-11 MED ORDER — MIDAZOLAM HCL 2 MG/2ML IJ SOLN
INTRAMUSCULAR | Status: AC
  Administered 2023-09-11 (×2): 1 mg
  Filled 2023-09-11: qty 4

## 2023-09-11 MED ORDER — FENTANYL CITRATE (PF) 100 MCG/2ML IJ SOLN
INTRAMUSCULAR | Status: AC
  Administered 2023-09-11 (×2): 25 ug
  Filled 2023-09-11: qty 2

## 2023-09-11 NOTE — Progress Notes (Signed)
     [facility] REFERRAL        Occupational Therapy * Physical Therapy * Speech Therapy                           DATE 09/11/2023  PATIENT NAME Alan Wise  PATIENT MRN [id number]       DIAGNOSIS/DIAGNOSIS CODE : I63.9  DATE OF DISCHARGE: 09/11/2023       PRIMARY CARE PHYSICIAN  Nicki Reaper  PCP PHONE/FAX [phone]     Dear Provider (Name: [facility] __  Fax: [phone]   I certify that I have examined this patient and that occupational/physical/speech therapy is necessary on an outpatient basis.    The patient has expressed interest in completing their recommended course of therapy at your  location.  Once a formal order from the patient's primary care physician has been obtained, please  contact him/her to schedule an appointment for evaluation at your earliest convenience.   [ X]  Physical Therapy Evaluate and Treat  [ X ]  Occupational Therapy Evaluate and Treat  [  ]  Speech Therapy Evaluate and Treat         The patient's primary care physician (listed above) must furnish and be responsible for a formal order such that the recommended services may be furnished while under the primary physician's care, and that the plan of care will be established and reviewed every 30 days (or more often if condition necessitates).

## 2023-09-11 NOTE — Progress Notes (Signed)
 NEUROLOGY CONSULT FOLLOW UP NOTE   Date of service: September 11, 2023 Patient Name: Alan Wise MRN:  [id number] DOB:  14-Aug-1970   Interval Hx/subjective   No further seizures  Vitals   Vitals:   09/11/23 0900 09/11/23 0930 09/11/23 0955 09/11/23 1040  BP: (!) 173/78 (!) 155/88  (!) 141/85  Pulse: 67 67 72 62  Resp: 12 20 11 16   Temp:    (!) 97.4 F (36.3 C)  TempSrc:    Oral  SpO2: 96% 100% 98% 97%  Weight:      Height:         Body mass index is 22.53 kg/m.  Physical Exam   Constitutional: Appears well-developed and well-nourished.   Neurologic Examination   Gen: in bed, NAD  Neuro: MS: Awake, alert, interactive and appropriate, he has a significant aphaxsia.  CN: He has a left-sided field cut, mild left facial weakness Motor: He has mild weakness of his left grip, but no drift Sensory: Intact to light touch  Labs and Diagnostic Imaging   CBC:  Recent Labs  Lab 09/07/23 1218 09/09/23 1240  WBC 8.7 7.1  NEUTROABS 5.9  --   HGB 15.4 15.8  HCT 43.8 44.3  MCV 82.6 83.0  PLT 195 210    Basic Metabolic Panel:  Lab Results  Component Value Date   NA 138 09/09/2023   K 4.2 09/09/2023   CO2 27 09/09/2023   GLUCOSE 175 (H) 09/09/2023   BUN 15 09/09/2023   CREATININE 0.95 09/09/2023   CALCIUM 9.3 09/09/2023   GFRNONAA >60 09/09/2023   GFRAA 123 12/05/2020   Lipid Panel:  Lab Results  Component Value Date   LDLCALC 156 (H) 09/10/2023   HgbA1c:  Lab Results  Component Value Date   HGBA1C 6.3 (H) 08/19/2023   Urine Drug Screen: No results found for: "LABOPIA", "COCAINSCRNUR", "LABBENZ", "AMPHETMU", "THCU", "LABBARB"   Imaging(Personally reviewed): Repeat MRI, fading diffusion changes   Impression   Alan Wise is a 53 y.o. 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  His echo does not reveal a clear embolic source, nor does his vascular imaging.  At this point,  I would favor further investigation given his young age, and would recommend TEE and prolonged cardiac monitoring.  He did have a seizure overnight his first night, but had missed a dose of lamotrigine, so no changes were made.   Recommendations  TEE, can be done outpt Continue aspirin 81 mg daily He will need prolonged cardiac monitoring, loop recorder if possible Continue atorvastatin 40 mg daily Continue lamotrigine 150 twice daily Neurology will be available as needed.  ______________________________________________________________________   Thank you for the opportunity to take part in the care of this patient. If you have any further questions, please contact the neurology consultation team on call. Updated oncall schedule is listed on AMION.  Signed,  Ritta Slot [facility]

## 2023-09-11 NOTE — Discharge Summary (Signed)
 Physician Discharge Summary  Alan Wise ZOX:[id number] DOB: 1971/05/22 DOA: 09/09/2023  PCP: Lorre Munroe, NP  Admit date: 09/09/2023 Discharge date: 09/11/2023  Admitted From: Home Disposition:  Home  Recommendations for Outpatient Follow-up:  Follow up with PCP in 1-2 weeks   Home Health:No Equipment/Devices:None   Discharge Condition:Stable  CODE STATUS:FULL  Diet recommendation: Reg  Brief/Interim Summary: 53 year old male with history of seizure, brain tumor with necrosis, history of CVA with aphasia, presents to the emergency department for chief concerns of stroke on MRI.    Outpatient MRI brain on 09/07/2023: Read as foci of restricted diffusion and T2 FLAIR hyperintense signal abnormality within the right cerebral peduncle, anterior left temporal lobe, colossal genu and right insula, as described and new from prior MRI on 07/06/2023.   ED treatment: None.  EDP discussed with neurology, Dr. Amada Jupiter who recommends hospital admission and to follow direction from Dr. Barbaraann Cao, medical oncology note on 09/08/2023. At bedside, patient is able to tell me his first and last name, age, current location, current calendar year.   He reports decreasing activity over the last week. He reports baseline difficulty speaking, which is unchanged.   Last week, he went on Spring Break to spend time with his parents. On weDnesday, he fell from two step ladder and pt did not provide much information.   On Thursday, mother noticed that patient had a facial droop.   On Friday, he came home and spouse reports that she was concerned about a stroke and called Dr. Barbaraann Cao.   Family reports that she noticed facial droop and slurred speech which is new for him.  At bedside, she states that this has improved.   Patient has left sided neglect/decreased visual field.   3/20: Case discussed with neurology Dr. Amada Jupiter.  Patient apparently had a seizure per family lasting 20 minutes 3/19 p.m.   Broke with Ativan.  Case was discussed with stroke specialist at [facility].  Cryptogenic stroke of atypical vascular distribution.  Neurology recommending TEE and loop recorder.  Cardiology and EP involved.   3/21: TEE attempted but procedure aborted due to difficult anatomy.  Cardiology recommended pediatric scope which we do not have at [facility] inpatient.  EP will set up outpatient follow-up for loop recorder placement.  Patient will return to outpatient neurology.  At time of discharge can resume home Lamictal 150 mg twice daily at home aspirin 81 mg daily.  No other antiplatelet agents recommended per neurology at this time    Discharge Diagnoses:  Principal Problem:   Stroke [facility]) Active Problems:   Oligodendroglioma (HCC)   Status epilepticus (HCC)   Type 2 diabetes mellitus with hyperglycemia (HCC)   Hyperlipidemia associated with type 2 diabetes mellitus (HCC)   Focal seizures (HCC)   Essential hypertension   Seizure disorder (HCC)   Uncontrolled type 2 diabetes mellitus with hypoglycemia, with long-term current use of insulin (HCC)   Acute CVA (cerebrovascular accident) (HCC)   * Stroke [facility]) Neurology consulted.  Case discussed with stroke specialist at [facility].  Cryptogenic stroke of atypical vascular distribution.  TTE reassuring.  Out of window for permissive hypertension.  Tolerating p.o. intake.  Diet advanced per SLP. Plan: Attempted TEE however procedure aborted due to difficult anatomy.  Cardiology to arrange for outpatient TEE.  At time of discharge recommend outpatient PT and OT.  Ambulatory.  Placed.  Can resume home aspirin and statin.  Outpatient follow-up for nephrology    Status epilepticus [facility]) Breakthrough seizures Breakthrough seizure  noted 3/19.   Resume home Lamictal 150 mg twice daily   Essential hypertension Patient is outside window for permissive hypertension Home amlodipine 5 mg daily resumed   Discharge Instructions  Discharge Instructions      Ambulatory referral to Occupational Therapy   Complete by: As directed    Ambulatory referral to Physical Therapy   Complete by: As directed    Ambulatory referral to Speech Therapy   Complete by: As directed    Diet - low sodium heart healthy   Complete by: As directed    Increase activity slowly   Complete by: As directed       Allergies as of 09/11/2023       Reactions   Contrast Media [iodinated Contrast Media] Swelling        Medication List     TAKE these medications    amLODipine 5 MG tablet Commonly known as: Norvasc Take 1 tablet (5 mg total) by mouth daily.   aspirin EC 81 MG tablet Take 1 tablet (81 mg total) by mouth daily. Swallow whole.   atorvastatin 40 MG tablet Commonly known as: Lipitor Take 1 tablet (40 mg total) by mouth daily.   cholecalciferol 25 MCG (1000 UNIT) tablet Commonly known as: VITAMIN D3 Take 1 tablet (1,000 Units total) by mouth daily.   lamoTRIgine 150 MG tablet Commonly known as: LAMICTAL Take 1 tablet (150 mg total) by mouth 2 (two) times daily.   propranolol 20 MG tablet Commonly known as: INDERAL TAKE 1 TABLET BY MOUTH TWICE A DAY   Valtoco 15 MG Dose 7.5 MG/0.1ML Lqpk Generic drug: diazePAM (15 MG Dose) Place 15 mg into the nose once as needed for up to 1 dose (seizure).        Follow-up Information     Lorre Munroe, NP Follow up.   Specialties: Internal Medicine, Emergency Medicine Why: Hospital follow up Contact information: [address] [place] [address] [phone]                Allergies  Allergen Reactions   Contrast Media [Iodinated Contrast Media] Swelling    Consultations: Neurology   Procedures/Studies: ECHOCARDIOGRAM COMPLETE Result Date: 09/10/2023    ECHOCARDIOGRAM REPORT   Patient Name:   Alan Wise Date of Exam: 09/10/2023 Medical Rec #:  [id number]         Height:       68.0 in Accession #:    [id number]        Weight:       148.1 lb Date of Birth:  [birth year]         BSA:          1.799 m Patient Age:    52 years          BP:           138/81 mmHg Patient Gender: M                 HR:           55 bpm. Exam Location:  [facility] Procedure: 2D Echo, Cardiac Doppler, Color Doppler and Saline Contrast Bubble            Study (Both Spectral and Color Flow Doppler were utilized during            procedure). Indications:     Stroke I63.9  History:         Patient has prior history of Echocardiogram examinations, most  recent 01/15/2022. Risk Factors:Diabetes and Dyslipidemia.  Sonographer:     Cristela Blue Referring Phys:  [id number] AMY N COX Diagnosing Phys: Julien Nordmann MD  Sonographer Comments: No parasternal window and no subcostal window. IMPRESSIONS  1. Left ventricular ejection fraction, by estimation, is 55 to 60%. The left ventricle has normal function. The left ventricle has no regional wall motion abnormalities. Left ventricular diastolic parameters are consistent with Grade I diastolic dysfunction (impaired relaxation).  2. Right ventricular systolic function is normal. The right ventricular size is normal. There is normal pulmonary artery systolic pressure. The estimated right ventricular systolic pressure is 25.8 mmHg.  3. The mitral valve is normal in structure. No evidence of mitral valve regurgitation. No evidence of mitral stenosis.  4. The aortic valve is normal in structure. Aortic valve regurgitation is not visualized. No aortic stenosis is present.  5. The inferior vena cava is normal in size with greater than 50% respiratory variability, suggesting right atrial pressure of 3 mmHg.  6. Agitated saline contrast bubble study was negative, with no evidence of any interatrial shunt. FINDINGS  Left Ventricle: Left ventricular ejection fraction, by estimation, is 55 to 60%. The left ventricle has normal function. The left ventricle has no regional wall motion abnormalities. Strain was performed and the global longitudinal strain is indeterminate. The left  ventricular internal cavity size was normal in size. There is no left ventricular hypertrophy. Left ventricular diastolic parameters are consistent with Grade I diastolic dysfunction (impaired relaxation). Right Ventricle: The right ventricular size is normal. No increase in right ventricular wall thickness. Right ventricular systolic function is normal. There is normal pulmonary artery systolic pressure. The tricuspid regurgitant velocity is 2.28 m/s, and  with an assumed right atrial pressure of 5 mmHg, the estimated right ventricular systolic pressure is 25.8 mmHg. Left Atrium: Left atrial size was normal in size. Right Atrium: Right atrial size was normal in size. Pericardium: There is no evidence of pericardial effusion. Mitral Valve: The mitral valve is normal in structure. No evidence of mitral valve regurgitation. No evidence of mitral valve stenosis. MV peak gradient, 2.2 mmHg. The mean mitral valve gradient is 1.0 mmHg. Tricuspid Valve: The tricuspid valve is normal in structure. Tricuspid valve regurgitation is not demonstrated. No evidence of tricuspid stenosis. Aortic Valve: The aortic valve is normal in structure. Aortic valve regurgitation is not visualized. No aortic stenosis is present. Aortic valve mean gradient measures 2.0 mmHg. Aortic valve peak gradient measures 4.0 mmHg. Aortic valve area, by VTI measures 3.01 cm. Pulmonic Valve: The pulmonic valve was normal in structure. Pulmonic valve regurgitation is not visualized. No evidence of pulmonic stenosis. Aorta: The aortic root is normal in size and structure. Venous: The inferior vena cava is normal in size with greater than 50% respiratory variability, suggesting right atrial pressure of 3 mmHg. IAS/Shunts: No atrial level shunt detected by color flow Doppler. Agitated saline contrast was given intravenously to evaluate for intracardiac shunting. Agitated saline contrast bubble study was negative, with no evidence of any interatrial shunt.  There  is no evidence of a patent foramen ovale. There is no evidence of an atrial septal defect. Additional Comments: 3D was performed not requiring image post processing on an independent workstation and was indeterminate.  LEFT VENTRICLE PLAX 2D LVIDd:         4.10 cm   Diastology LVIDs:         2.60 cm   LV e' medial:    5.33 cm/s LV PW:  1.20 cm   LV E/e' medial:  11.8 LV IVS:        1.10 cm   LV e' lateral:   6.85 cm/s LVOT diam:     2.00 cm   LV E/e' lateral: 9.2 LV SV:         52 LV SV Index:   29 LVOT Area:     3.14 cm  RIGHT VENTRICLE RV Basal diam:  2.10 cm RV Mid diam:    2.00 cm RV S prime:     11.60 cm/s TAPSE (M-mode): 1.6 cm LEFT ATRIUM           Index        RIGHT ATRIUM          Index LA diam:      3.30 cm 1.83 cm/m   RA Area:     7.43 cm LA Vol (A2C): 21.5 ml 11.95 ml/m  RA Volume:   11.30 ml 6.28 ml/m LA Vol (A4C): 13.1 ml 7.28 ml/m  AORTIC VALVE AV Area (Vmax):    2.78 cm AV Area (Vmean):   2.94 cm AV Area (VTI):     3.01 cm AV Vmax:           100.00 cm/s AV Vmean:          59.800 cm/s AV VTI:            0.172 m AV Peak Grad:      4.0 mmHg AV Mean Grad:      2.0 mmHg LVOT Vmax:         88.60 cm/s LVOT Vmean:        56.000 cm/s LVOT VTI:          0.165 m LVOT/AV VTI ratio: 0.96  AORTA Ao Root diam: 2.60 cm MITRAL VALVE               TRICUSPID VALVE MV Area (PHT): 3.58 cm    TR Peak grad:   20.8 mmHg MV Area VTI:   2.50 cm    TR Vmax:        228.00 cm/s MV Peak grad:  2.2 mmHg MV Mean grad:  1.0 mmHg    SHUNTS MV Vmax:       0.75 m/s    Systemic VTI:  0.16 m MV Vmean:      49.3 cm/s   Systemic Diam: 2.00 cm MV Decel Time: 212 msec MV E velocity: 62.90 cm/s MV A velocity: 75.50 cm/s MV E/A ratio:  0.83 Julien Nordmann MD Electronically signed by Julien Nordmann MD Signature Date/Time: 09/10/2023/11:37:44 AM    Final    MR BRAIN WO CONTRAST Result Date: 09/10/2023 CLINICAL DATA:  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 EXAM: MRI HEAD WITHOUT CONTRAST  TECHNIQUE: Multiplanar, multiecho pulse sequences of the brain and surrounding structures were obtained without intravenous contrast. COMPARISON:  Comparison made with CT from earlier the same day as well as multiple previous studies, including recent brain MRI from 09/07/2023 FINDINGS: Brain: Cerebral volume loss with mild chronic microvascular ischemic disease, stable. Few scattered superimposed remote lacunar infarcts noted about the pons. Previously noted areas of diffusion signal abnormality involving the anterior genu of the corpus callosum, right insula, and mesial left temporal lobe are slightly decreased in prominence from prior, likely reflecting small evolving subacute infarcts. No associated hemorrhage or mass effect. Additional 7 mm focus of diffusion signal abnormality at the right cerebral peduncle is relatively stable, and remains indeterminate, possibly reflecting  ischemic change and/or tumor involvement. No other new or interval areas of infarction. Multifocal signal abnormality involving the right frontal and temporal lobes, consistent with known brain tumor. Few scattered foci of associated cystic encephalomalacia with associated T2/FLAIR signal. Associated diffusion signal abnormality is relatively stable. Scattered areas of intrinsic precontrast T1 hyperintensity and susceptibility artifact are also not appreciably changed, likely reflecting post treatment changes and/or chronic blood products. No evidence for new intracranial hemorrhage by MRI. Previously noted hyperdensity on prior CT corresponds with these signal changes. Although these changes on CT from earlier today are new as compared to most recent CT from 05/13/2023, these changes not appreciably changed from intervening brain MRIs, with no evidence for new or interval hemorrhage. No other mass lesion or mass effect. Stable ventricular size and morphology without hydrocephalus. No extra-axial fluid collection. Pituitary gland within  normal limits. No other intrinsic temporal lobe abnormality. Vascular: Major intracranial vascular flow voids are maintained. Skull and upper cervical spine: Craniocervical junction within normal limits. Bone marrow signal intensity normal. No scalp soft tissue abnormality. Sinuses/Orbits: Globes and orbital soft tissues within normal limits. Mild mucosal thickening present about the ethmoidal air cells and maxillary sinuses, slightly worse on the left. No significant mastoid effusion. Other: None. IMPRESSION: 1. No acute intracranial abnormality. 2. Previously described subcentimeter areas of diffusion signal abnormality (seen on recent brain MRI from 09/07/2023) involving the anterior genu of the corpus callosum, right insula, and mesial left temporal lobe are slightly decreased in prominence from prior, likely reflecting evolving subacute infarcts. No associated hemorrhage or mass effect. No areas of new or interval infarction. 3. Additional 7 mm focus of diffusion signal abnormality at the right cerebral peduncle is relatively stable, and remains indeterminate, possibly reflecting ischemic change and/or tumor involvement. Attention at follow-up recommended. 4. Multifocal signal abnormality involving the right frontal and temporal lobes, consistent with known brain tumor. Overall appearance is not significantly changed, with no evidence for new or interval hemorrhage by MRI. 5. Underlying atrophy with chronic microvascular ischemic disease, with a few scattered remote lacunar infarcts about the pons. Electronically Signed   By: Rise Mu M.D.   On: 09/10/2023 00:51   CT HEAD WO CONTRAST ( ) Result Date: 09/09/2023 CLINICAL DATA:  History of oligodendroglioma, presenting with confusion and chest tightness. EXAM: CT HEAD WITHOUT CONTRAST TECHNIQUE: Contiguous axial images were obtained from the base of the skull through the vertex without intravenous contrast. RADIATION DOSE REDUCTION: This exam was  performed according to the departmental dose-optimization program which includes automated exposure control, adjustment of the mA and/or kV according to patient size and/or use of iterative reconstruction technique. COMPARISON:  May 13, 2023 FINDINGS: Brain: There is generalized cerebral atrophy with widening of the extra-axial spaces and ventricular dilatation. There are areas of decreased attenuation within the white matter tracts of the supratentorial brain, consistent with microvascular disease changes. Post treatment changes are again seen within the right frontal lobe. A mild-to-moderate amount of curvilinear increased attenuation (approximately 57.92 Hounsfield units) is also seen within this region, along the junction of the cortex and white matter. Mild involvement of the right parietal lobe is noted. There is no evidence of associated mass effect or midline shift. Vascular: Marked severity bilateral cavernous carotid artery calcification is noted. Skull: Normal. Negative for fracture or focal lesion. Sinuses/Orbits: There is marked severity left ethmoid sinus mucosal thickening. Other: None. IMPRESSION: 1. Post treatment changes within the right frontal lobe with a mild-to-moderate amount of curvilinear increased attenuation within this region,  as described above. Given the patient's history of oligodendroglioma, this may represent areas of post treatment mineralization and calcification. A small amount of acute hemorrhage cannot completely be excluded. MRI correlation is recommended. 2. Generalized cerebral atrophy and microvascular disease changes of the supratentorial brain. 3. Marked severity left ethmoid sinus disease. Electronically Signed   By: Aram Candela M.D.   On: 09/09/2023 21:11   MR ANGIO NECK W WO CONTRAST Result Date: 09/09/2023 CLINICAL DATA:  Follow-up examination for stroke. EXAM: MRA NECK WITHOUT AND WITH CONTRAST MRA HEAD WITHOUT CONTRAST TECHNIQUE: Multiplanar and  multiecho pulse sequences of the neck were obtained without and with intravenous contrast. Angiographic images of the neck were obtained using MRA technique without and with intravenous contrast; Angiographic images of the Circle of Willis were obtained using MRA technique without intravenous contrast. CONTRAST:  6mL GADAVIST GADOBUTROL 1 MMOL/ML IV SOLN COMPARISON:  Comparison made with brain MRI from 09/07/2023. FINDINGS: MRA NECK FINDINGS Aortic arch: Visualized aortic arch within normal limits for caliber with standard 3 vessel morphology. No stenosis about the origin of the great vessels. Right carotid system: Right common and internal carotid arteries are patent with antegrade flow. No dissection or hemodynamically significant stenosis about the right carotid artery system. Left carotid system: Left common and internal carotid arteries are patent with antegrade flow. No dissection or hemodynamically significant stenosis about the left carotid artery system. Vertebral arteries: Both vertebral arteries arise from subclavian arteries. Vertebral arteries are patent with antegrade flow. No dissection or hemodynamically significant stenosis. Other: None. MRA HEAD FINDINGS Anterior circulation: Both internal carotid arteries are patent to the termini without stenosis or other abnormality. Right A1 segment patent. Left A1 hypoplastic and/or absent. Normal anterior communicating artery complex. Anterior cerebral arteries patent without stenosis. No M1 stenosis or occlusion. Distal MCA branches perfused and symmetric. Posterior circulation: Visualized V4 segments patent without stenosis. Right PICA patent. Left PICA not seen. Basilar patent without stenosis. Superior cerebral arteries patent bilaterally. Fetal type origin of the PCAs bilaterally. Both PCAs patent to their distal aspects without significant stenosis. Anatomic variants: As above. Other: No intracranial aneurysm. IMPRESSION: Normal MRA of the head and  neck. No large vessel occlusion, hemodynamically significant stenosis, or other acute vascular abnormality. Electronically Signed   By: Rise Mu M.D.   On: 09/09/2023 20:17   MR ANGIO HEAD WO CONTRAST Result Date: 09/09/2023 CLINICAL DATA:  Follow-up examination for stroke. EXAM: MRA NECK WITHOUT AND WITH CONTRAST MRA HEAD WITHOUT CONTRAST TECHNIQUE: Multiplanar and multiecho pulse sequences of the neck were obtained without and with intravenous contrast. Angiographic images of the neck were obtained using MRA technique without and with intravenous contrast; Angiographic images of the Circle of Willis were obtained using MRA technique without intravenous contrast. CONTRAST:  6mL GADAVIST GADOBUTROL 1 MMOL/ML IV SOLN COMPARISON:  Comparison made with brain MRI from 09/07/2023. FINDINGS: MRA NECK FINDINGS Aortic arch: Visualized aortic arch within normal limits for caliber with standard 3 vessel morphology. No stenosis about the origin of the great vessels. Right carotid system: Right common and internal carotid arteries are patent with antegrade flow. No dissection or hemodynamically significant stenosis about the right carotid artery system. Left carotid system: Left common and internal carotid arteries are patent with antegrade flow. No dissection or hemodynamically significant stenosis about the left carotid artery system. Vertebral arteries: Both vertebral arteries arise from subclavian arteries. Vertebral arteries are patent with antegrade flow. No dissection or hemodynamically significant stenosis. Other: None. MRA HEAD FINDINGS Anterior circulation: Both  internal carotid arteries are patent to the termini without stenosis or other abnormality. Right A1 segment patent. Left A1 hypoplastic and/or absent. Normal anterior communicating artery complex. Anterior cerebral arteries patent without stenosis. No M1 stenosis or occlusion. Distal MCA branches perfused and symmetric. Posterior circulation:  Visualized V4 segments patent without stenosis. Right PICA patent. Left PICA not seen. Basilar patent without stenosis. Superior cerebral arteries patent bilaterally. Fetal type origin of the PCAs bilaterally. Both PCAs patent to their distal aspects without significant stenosis. Anatomic variants: As above. Other: No intracranial aneurysm. IMPRESSION: Normal MRA of the head and neck. No large vessel occlusion, hemodynamically significant stenosis, or other acute vascular abnormality. Electronically Signed   By: Rise Mu M.D.   On: 09/09/2023 20:17   DG Chest Port 1 View Result Date: 09/09/2023 CLINICAL DATA:  [id number] Chest pain [id number] EXAM: PORTABLE CHEST 1 VIEW COMPARISON:  04/11/2023 FINDINGS: Bilateral lung fields are clear. Bilateral costophrenic angles are clear. Normal cardio-mediastinal silhouette. No acute osseous abnormalities. The soft tissues are within normal limits. IMPRESSION: No active disease. Electronically Signed   By: Jules Schick M.D.   On: 09/09/2023 15:09   MR BRAIN W WO CONTRAST Result Date: 09/07/2023 CLINICAL DATA:  Provided history: Oligodendroglioma. Brain/CNS neoplasm, assess treatment response. Additional history provided by the scanning technologist: Multiple falls, vision loss in left eye, aphasia. Avastin therapy. Restaging. EXAM: MRI HEAD WITHOUT AND WITH CONTRAST TECHNIQUE: Multiplanar, multiecho pulse sequences of the brain and surrounding structures were obtained without and with intravenous contrast. CONTRAST:  6mL GADAVIST GADOBUTROL 1 MMOL/ML IV SOLN COMPARISON:  Brain MRI 07/06/2023. FINDINGS: Brain: Mild generalized cerebral atrophy. 9 mm focus of restricted diffusion with associated T2 FLAIR hyperintense signal abnormality in the right cerebral peduncle (series 5, image 73). 5 mm focus of restricted diffusion with associated T2 FLAIR hyperintense signal abnormality in the anterior left temporal lobe (series 5, image 74). 3 mm focus of restricted diffusion  within the callosal genu at midline (series 5, image 81). 5 mm focus of restricted diffusion within the right insula with associated T2 FLAIR hyperintense signal abnormality. These foci are new as compared to the brain MRI of 07/06/2023 and may reflect acute/early subacute infarcts. However, new sites of tumor cannot be excluded. There is a new 3 mm focus of enhancement in the right subinsular region (series 16, image 90). No other new or progressive foci of enhancement are identified. An irregular focus of restricted diffusion within the right retrolenticular/periatrial region has mildly increased in extent since the prior MRI. A region of restricted diffusion within the mid and anterior right frontal lobe (along the margins of the right lateral ventricle) has also slightly increased in extent (for instance, extending more medially within the body of the corpus callosum on series 10, image 25). T2 FLAIR hyperintense signal abnormality within the right cerebral hemisphere has not significantly changed, nor has T2 FLAIR hyperintense signal abnormality within the body/genu of corpus callosum and left frontal lobe periventricular white matter. Unchanged parenchymal swelling at the right insula. Superimposed foci of chronic blood products within the right frontal lobe have increased in extent. Redemonstrated focus of cystic encephalomalacia at the anterior right insula. Chronic lacunar infarcts again demonstrated within the left aspect of the pons. Mild multifocal T2 FLAIR hyperintense signal abnormality elsewhere within the cerebral white matter and pons, nonspecific but compatible chronic small vessel ischemic disease. No extra-axial fluid collection. No midline shift. Vascular: Maintained flow voids within the proximal large arterial vessels. Skull and upper cervical spine:  No focal worrisome marrow lesion. Right frontoparietal burr hole. Sinuses/Orbits: No focal worrisome marrow lesion. Incompletely assessed  cervical spondylosis. Other: No mass or acute finding within the imaged orbits. Mild mucosal thickening within the left ethmoid and left maxillary sinuses. MRi brain impression #1 called by telephone at the time of interpretation on 09/07/2023 at 7:28 pm to provider Glenbeigh VASLOW , who verbally acknowledged these results. IMPRESSION: 1. Foci of restricted diffusion and T2 FLAIR hyperintense signal abnormality within the right cerebral peduncle, anterior left temporal lobe, callosal genu and right insula, as described and new from the prior brain MRI of 07/06/2023. These foci may reflect acute/early subacute infarcts. However, new sites of tumor cannot be excluded. A short-interval follow-up brain MRI is recommended. 2. Foci of restricted diffusion within the right retrolenticular/periatrial region and right frontal lobe have slightly increased in extent since the prior MRI. Additionally, there is a new 3 mm right subinsular focus of parenchymal enhancement. These findings may reflect evolving treatment changes. However, a short-interval follow-up brain MRI is recommended for close surveillance. 3. Pre-contrast T1 hyperintense chronic blood products within the right frontal lobe have increased in extent. 4. Otherwise no significant interval change. 5. 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 Electronically Signed   By: Jackey Loge D.O.   On: 09/07/2023 20:07      Subjective: Seen and examined on the day of discharge.  Stable no distress.  Appropriate for discharge home.  Discharge Exam: Vitals:   09/11/23 0955 09/11/23 1040  BP:  (!) 141/85  Pulse: 72 62  Resp: 11 16  Temp:  (!) 97.4 F (36.3 C)  SpO2: 98% 97%   Vitals:   09/11/23 0900 09/11/23 0930 09/11/23 0955 09/11/23 1040  BP: (!) 173/78 (!) 155/88  (!) 141/85  Pulse: 67 67 72 62  Resp: 12 20 11 16   Temp:    (!) 97.4 F (36.3 C)  TempSrc:    Oral  SpO2: 96% 100%  98% 97%  Weight:      Height:        General: Pt is alert, awake, not in acute distress Cardiovascular: RRR, S1/S2 +, no rubs, no gallops Respiratory: CTA bilaterally, no wheezing, no rhonchi Abdominal: Soft, NT, ND, bowel sounds + Extremities: no edema, no cyanosis    The results of significant diagnostics from this hospitalization (including imaging, microbiology, ancillary and laboratory) are listed below for reference.     Microbiology: No results found for this or any previous visit (from the past 240 hours).   Labs: BNP (last 3 results) No results for input(s): "BNP" in the last 8760 hours. Basic Metabolic Panel: Recent Labs  Lab 09/09/23 1240  NA 138  K 4.2  CL 103  CO2 27  GLUCOSE 175*  BUN 15  CREATININE 0.95  CALCIUM 9.3   Liver Function Tests: No results for input(s): "AST", "ALT", "ALKPHOS", "BILITOT", "PROT", "ALBUMIN" in the last 168 hours. No results for input(s): "LIPASE", "AMYLASE" in the last 168 hours. No results for input(s): "AMMONIA" in the last 168 hours. CBC: Recent Labs  Lab 09/07/23 1218 09/09/23 1240  WBC 8.7 7.1  NEUTROABS 5.9  --   HGB 15.4 15.8  HCT 43.8 44.3  MCV 82.6 83.0  PLT 195 210   Cardiac Enzymes: No results for input(s): "CKTOTAL", "CKMB", "CKMBINDEX", "TROPONINI" in the last 168 hours. BNP: Invalid input(s): "POCBNP" CBG: Recent Labs  Lab 09/10/23 0940 09/10/23 1216 09/10/23 1800 09/10/23 2003 09/11/23  1039  GLUCAP 82 88 163* 103* 111*   D-Dimer No results for input(s): "DDIMER" in the last 72 hours. Hgb A1c No results for input(s): "HGBA1C" in the last 72 hours. Lipid Profile Recent Labs    09/10/23 0614  CHOL 207*  HDL 28*  LDLCALC 156*  TRIG 116  CHOLHDL 7.4   Thyroid function studies No results for input(s): "TSH", "T4TOTAL", "T3FREE", "THYROIDAB" in the last 72 hours.  Invalid input(s): "FREET3" Anemia work up No results for input(s): "VITAMINB12", "FOLATE", "FERRITIN", "TIBC", "IRON",  "RETICCTPCT" in the last 72 hours. Urinalysis    Component Value Date/Time   COLORURINE YELLOW (A) 01/24/2016 2152   APPEARANCEUR Cloudy (A) 02/20/2016 1441   LABSPEC 1.010 01/24/2016 2152   LABSPEC 1.026 05/04/2012 2052   PHURINE 6.0 01/24/2016 2152   GLUCOSEU 3+ (A) 02/20/2016 1441   GLUCOSEU Negative 05/04/2012 2052   HGBUR 3+ (A) 01/24/2016 2152   BILIRUBINUR Negative 02/20/2016 1441   BILIRUBINUR Negative 05/04/2012 2052   KETONESUR NEGATIVE 01/24/2016 2152   PROTEINUR 100 (A) 09/07/2023 1218   NITRITE Negative 02/20/2016 1441   NITRITE NEGATIVE 01/24/2016 2152   LEUKOCYTESUR Negative 02/20/2016 1441   LEUKOCYTESUR Negative 05/04/2012 2052   Sepsis Labs Recent Labs  Lab 09/07/23 1218 09/09/23 1240  WBC 8.7 7.1   Microbiology No results found for this or any previous visit (from the past 240 hours).   Time coordinating discharge: Over 30 minutes  SIGNED:   Tresa Moore, MD  [facility] 09/11/2023, 2:31 PM Pager   If 7PM-7AM, please contact night-coverage

## 2023-09-11 NOTE — Procedures (Signed)
 Transesophageal Echocardiogram :  Indication: stroke Patient complaints of upper esophageal dysphagia  Procedure: 10 ml of viscous lidocaine were given orally to provide local anesthesia to the oropharynx. The patient was positioned supine on the left side, bite block provided. The patient was moderately sedated with the doses of versed and fentanyl as detailed below.  Using digital technique an omniplane probe advanced into the oral cavity but was able to advance pass the oral pharynx (x5 attempts). Procedure stopped  Moderate sedation: 1. Sedation used:  Versed: 2mg , Fentanyl: 2. Time administered:   755  Time when patient started recovery: 815 3. I was face to face during this time: 20 mins  Conclusion -hx of dysphagia -Unsuccessful attempts (x5) to advance probe pass the oral pharynx. -consider a pedi-probe (not available at [facility]).   Arlys John Agbor-Etang 09/11/2023 8:14 AM

## 2023-09-11 NOTE — TOC Transition Note (Signed)
 Transition of Care [facility]) - Discharge Note   Patient Details  Name: Alan Wise MRN: [id number] Date of Birth: 1971-01-02  [facility] CM/SW Contact:  Cherre Blanc, RN Phone Number: 09/11/2023, 12:25 PM   Clinical Narrative:    Patient is medically clear for dc to home with his wife. TOC notified that patient will need outpatient PT/OT. Referral sent to [facility] outpatient rehab. No other dc needs identified.         Patient Goals and CMS Choice            Discharge Placement                       Discharge Plan and Services Additional resources added to the After Visit Summary for                                       Social Drivers of Health (SDOH) Interventions SDOH Screenings   Food Insecurity: No Food Insecurity (09/10/2023)  Housing: Low Risk  (09/10/2023)  Transportation Needs: No Transportation Needs (09/10/2023)  Utilities: Not At Risk (09/10/2023)  Alcohol Screen: Low Risk  (05/01/2022)  Depression (PHQ2-9): Low Risk  (02/16/2023)  Financial Resource Strain: Low Risk  (12/05/2022)   Received from [facility]  Social Connections: Moderately Isolated (09/10/2023)  Tobacco Use: Low Risk  (09/09/2023)     Readmission Risk Interventions     No data to display

## 2023-09-11 NOTE — Telephone Encounter (Addendum)
 09/11/23: spoke with pt at 10am and he has some difficulty talking. Pt was able to communicate that it wasn't a good time to try to schedule his ECHO for various reasons. We agreed that we would try to call back next week to schedule and hopefully he would have someone with him who could help facilitate. ~AHT  12:25pm: Received communication from Rejeana Brock, MD that pt had his echo while admitted. Cancelling out patient order. ~AHT

## 2023-09-11 NOTE — Telephone Encounter (Signed)
 EP consulted for cryptogenic stroke.Patient discharged prior to consult.  Unable to implant ILR d/t MD availability  A 2 week live zio was ordered at discharge for home enrollment.  If zio is normal, recommend he see Dr. Jimmey Ralph for ILR implant (with precert prior to appt).     Sherie Don, NP Electrophysiology 09/11/23 4:10 PM

## 2023-09-11 NOTE — Progress Notes (Signed)
 Pt eating ice, coughed on ice, intermittent drowsy

## 2023-09-11 NOTE — Plan of Care (Signed)
  Problem: Skin Integrity: Goal: Risk for impaired skin integrity will decrease Outcome: Progressing   Problem: Coping: Goal: Will identify appropriate support needs Outcome: Progressing   Problem: Self-Care: Goal: Ability to participate in self-care as condition permits will improve Outcome: Progressing Goal: Ability to communicate needs accurately will improve Outcome: Progressing   Problem: Activity: Goal: Risk for activity intolerance will decrease Outcome: Progressing   Problem: Pain Managment: Goal: General experience of comfort will improve and/or be controlled Outcome: Progressing

## 2023-09-13 ENCOUNTER — Encounter: Payer: Self-pay | Admitting: Internal Medicine

## 2023-09-14 ENCOUNTER — Other Ambulatory Visit: Payer: Self-pay

## 2023-09-14 ENCOUNTER — Inpatient Hospital Stay (HOSPITAL_COMMUNITY)

## 2023-09-14 ENCOUNTER — Encounter: Payer: Self-pay | Admitting: Cardiology

## 2023-09-14 ENCOUNTER — Inpatient Hospital Stay (HOSPITAL_BASED_OUTPATIENT_CLINIC_OR_DEPARTMENT_OTHER): Admitting: Internal Medicine

## 2023-09-14 ENCOUNTER — Encounter: Payer: Self-pay | Admitting: Internal Medicine

## 2023-09-14 ENCOUNTER — Inpatient Hospital Stay (HOSPITAL_COMMUNITY)
Admission: EM | Admit: 2023-09-14 | Discharge: 2023-09-20 | DRG: 065 | Disposition: A | Discharge status: Rehabilitation Facility | Attending: Student | Admitting: Student

## 2023-09-14 ENCOUNTER — Ambulatory Visit: Admitting: Speech Pathology

## 2023-09-14 ENCOUNTER — Inpatient Hospital Stay

## 2023-09-14 DIAGNOSIS — Z83438 Family history of other disorder of lipoprotein metabolism and other lipidemia: Secondary | ICD-10-CM

## 2023-09-14 DIAGNOSIS — R42 Dizziness and giddiness: Secondary | ICD-10-CM | POA: Diagnosis present

## 2023-09-14 DIAGNOSIS — Z842 Family history of other diseases of the genitourinary system: Secondary | ICD-10-CM

## 2023-09-14 DIAGNOSIS — H53462 Homonymous bilateral field defects, left side: Secondary | ICD-10-CM | POA: Diagnosis present

## 2023-09-14 DIAGNOSIS — Z7189 Other specified counseling: Secondary | ICD-10-CM | POA: Diagnosis not present

## 2023-09-14 DIAGNOSIS — H538 Other visual disturbances: Secondary | ICD-10-CM | POA: Diagnosis present

## 2023-09-14 DIAGNOSIS — Z9889 Other specified postprocedural states: Secondary | ICD-10-CM

## 2023-09-14 DIAGNOSIS — E860 Dehydration: Secondary | ICD-10-CM | POA: Diagnosis present

## 2023-09-14 DIAGNOSIS — C719 Malignant neoplasm of brain, unspecified: Secondary | ICD-10-CM | POA: Diagnosis not present

## 2023-09-14 DIAGNOSIS — K219 Gastro-esophageal reflux disease without esophagitis: Secondary | ICD-10-CM | POA: Diagnosis present

## 2023-09-14 DIAGNOSIS — I69320 Aphasia following cerebral infarction: Secondary | ICD-10-CM | POA: Diagnosis not present

## 2023-09-14 DIAGNOSIS — I6300 Cerebral infarction due to thrombosis of unspecified precerebral artery: Secondary | ICD-10-CM | POA: Diagnosis not present

## 2023-09-14 DIAGNOSIS — Y842 Radiological procedure and radiotherapy as the cause of abnormal reaction of the patient, or of later complication, without mention of misadventure at the time of the procedure: Secondary | ICD-10-CM | POA: Diagnosis present

## 2023-09-14 DIAGNOSIS — E785 Hyperlipidemia, unspecified: Secondary | ICD-10-CM | POA: Diagnosis present

## 2023-09-14 DIAGNOSIS — Z7982 Long term (current) use of aspirin: Secondary | ICD-10-CM

## 2023-09-14 DIAGNOSIS — G9340 Encephalopathy, unspecified: Secondary | ICD-10-CM

## 2023-09-14 DIAGNOSIS — R29707 NIHSS score 7: Secondary | ICD-10-CM | POA: Diagnosis present

## 2023-09-14 DIAGNOSIS — E1169 Type 2 diabetes mellitus with other specified complication: Secondary | ICD-10-CM | POA: Diagnosis present

## 2023-09-14 DIAGNOSIS — C711 Malignant neoplasm of frontal lobe: Secondary | ICD-10-CM | POA: Diagnosis present

## 2023-09-14 DIAGNOSIS — R1314 Dysphagia, pharyngoesophageal phase: Secondary | ICD-10-CM | POA: Diagnosis not present

## 2023-09-14 DIAGNOSIS — Z833 Family history of diabetes mellitus: Secondary | ICD-10-CM

## 2023-09-14 DIAGNOSIS — Z85841 Personal history of malignant neoplasm of brain: Secondary | ICD-10-CM

## 2023-09-14 DIAGNOSIS — Z7969 Long term (current) use of other immunomodulators and immunosuppressants: Secondary | ICD-10-CM

## 2023-09-14 DIAGNOSIS — I10 Essential (primary) hypertension: Secondary | ICD-10-CM | POA: Diagnosis present

## 2023-09-14 DIAGNOSIS — Z860100 Personal history of colon polyps, unspecified: Secondary | ICD-10-CM

## 2023-09-14 DIAGNOSIS — I6789 Other cerebrovascular disease: Secondary | ICD-10-CM

## 2023-09-14 DIAGNOSIS — G8194 Hemiplegia, unspecified affecting left nondominant side: Secondary | ICD-10-CM | POA: Diagnosis present

## 2023-09-14 DIAGNOSIS — Z8249 Family history of ischemic heart disease and other diseases of the circulatory system: Secondary | ICD-10-CM

## 2023-09-14 DIAGNOSIS — G9349 Other encephalopathy: Secondary | ICD-10-CM | POA: Diagnosis present

## 2023-09-14 DIAGNOSIS — G9389 Other specified disorders of brain: Secondary | ICD-10-CM | POA: Diagnosis present

## 2023-09-14 DIAGNOSIS — Y781 Therapeutic (nonsurgical) and rehabilitative radiological devices associated with adverse incidents: Secondary | ICD-10-CM | POA: Diagnosis present

## 2023-09-14 DIAGNOSIS — R1310 Dysphagia, unspecified: Secondary | ICD-10-CM | POA: Diagnosis present

## 2023-09-14 DIAGNOSIS — Z8673 Personal history of transient ischemic attack (TIA), and cerebral infarction without residual deficits: Secondary | ICD-10-CM

## 2023-09-14 DIAGNOSIS — R933 Abnormal findings on diagnostic imaging of other parts of digestive tract: Secondary | ICD-10-CM

## 2023-09-14 DIAGNOSIS — R2689 Other abnormalities of gait and mobility: Secondary | ICD-10-CM | POA: Diagnosis present

## 2023-09-14 DIAGNOSIS — G40909 Epilepsy, unspecified, not intractable, without status epilepticus: Secondary | ICD-10-CM | POA: Diagnosis present

## 2023-09-14 DIAGNOSIS — Z79899 Other long term (current) drug therapy: Secondary | ICD-10-CM

## 2023-09-14 DIAGNOSIS — R4701 Aphasia: Secondary | ICD-10-CM | POA: Diagnosis not present

## 2023-09-14 DIAGNOSIS — Z923 Personal history of irradiation: Secondary | ICD-10-CM

## 2023-09-14 DIAGNOSIS — I639 Cerebral infarction, unspecified: Secondary | ICD-10-CM | POA: Diagnosis not present

## 2023-09-14 DIAGNOSIS — R4586 Emotional lability: Secondary | ICD-10-CM | POA: Diagnosis present

## 2023-09-14 DIAGNOSIS — C710 Malignant neoplasm of cerebrum, except lobes and ventricles: Secondary | ICD-10-CM

## 2023-09-14 DIAGNOSIS — R269 Unspecified abnormalities of gait and mobility: Secondary | ICD-10-CM | POA: Diagnosis not present

## 2023-09-14 DIAGNOSIS — T66XXXA Radiation sickness, unspecified, initial encounter: Secondary | ICD-10-CM | POA: Diagnosis present

## 2023-09-14 DIAGNOSIS — I6381 Other cerebral infarction due to occlusion or stenosis of small artery: Principal | ICD-10-CM | POA: Diagnosis present

## 2023-09-14 DIAGNOSIS — Z515 Encounter for palliative care: Secondary | ICD-10-CM | POA: Diagnosis not present

## 2023-09-14 DIAGNOSIS — I69318 Other symptoms and signs involving cognitive functions following cerebral infarction: Secondary | ICD-10-CM

## 2023-09-14 DIAGNOSIS — I69392 Facial weakness following cerebral infarction: Secondary | ICD-10-CM

## 2023-09-14 DIAGNOSIS — R569 Unspecified convulsions: Secondary | ICD-10-CM | POA: Diagnosis not present

## 2023-09-14 DIAGNOSIS — I69328 Other speech and language deficits following cerebral infarction: Secondary | ICD-10-CM

## 2023-09-14 DIAGNOSIS — Z87442 Personal history of urinary calculi: Secondary | ICD-10-CM

## 2023-09-14 DIAGNOSIS — D496 Neoplasm of unspecified behavior of brain: Secondary | ICD-10-CM | POA: Diagnosis not present

## 2023-09-14 DIAGNOSIS — Z823 Family history of stroke: Secondary | ICD-10-CM

## 2023-09-14 DIAGNOSIS — Z85828 Personal history of other malignant neoplasm of skin: Secondary | ICD-10-CM

## 2023-09-14 DIAGNOSIS — E1165 Type 2 diabetes mellitus with hyperglycemia: Secondary | ICD-10-CM | POA: Diagnosis present

## 2023-09-14 DIAGNOSIS — Z9221 Personal history of antineoplastic chemotherapy: Secondary | ICD-10-CM

## 2023-09-14 DIAGNOSIS — Z7901 Long term (current) use of anticoagulants: Secondary | ICD-10-CM

## 2023-09-14 DIAGNOSIS — Z91041 Radiographic dye allergy status: Secondary | ICD-10-CM

## 2023-09-14 DIAGNOSIS — I69354 Hemiplegia and hemiparesis following cerebral infarction affecting left non-dominant side: Secondary | ICD-10-CM | POA: Diagnosis not present

## 2023-09-14 LAB — CBC WITH DIFFERENTIAL/PLATELET
Abs Immature Granulocytes: 0.04 10*3/uL (ref 0.00–0.07)
Basophils Absolute: 0.1 10*3/uL (ref 0.0–0.1)
Basophils Relative: 1 %
Eosinophils Absolute: 1 10*3/uL — ABNORMAL HIGH (ref 0.0–0.5)
Eosinophils Relative: 9 %
HCT: 44.7 % (ref 39.0–52.0)
Hemoglobin: 15.4 g/dL (ref 13.0–17.0)
Immature Granulocytes: 0 %
Lymphocytes Relative: 11 %
Lymphs Abs: 1.1 10*3/uL (ref 0.7–4.0)
MCH: 29.3 pg (ref 26.0–34.0)
MCHC: 34.5 g/dL (ref 30.0–36.0)
MCV: 85.1 fL (ref 80.0–100.0)
Monocytes Absolute: 0.4 10*3/uL (ref 0.1–1.0)
Monocytes Relative: 4 %
Neutro Abs: 7.6 10*3/uL (ref 1.7–7.7)
Neutrophils Relative %: 75 %
Platelets: 208 10*3/uL (ref 150–400)
RBC: 5.25 MIL/uL (ref 4.22–5.81)
RDW: 12.6 % (ref 11.5–15.5)
WBC: 10.2 10*3/uL (ref 4.0–10.5)
nRBC: 0 % (ref 0.0–0.2)

## 2023-09-14 LAB — COMPREHENSIVE METABOLIC PANEL
ALT: 22 U/L (ref 0–44)
AST: 20 U/L (ref 15–41)
Albumin: 4.1 g/dL (ref 3.5–5.0)
Alkaline Phosphatase: 70 U/L (ref 38–126)
Anion gap: 6 (ref 5–15)
BUN: 15 mg/dL (ref 6–20)
CO2: 30 mmol/L (ref 22–32)
Calcium: 9.2 mg/dL (ref 8.9–10.3)
Chloride: 102 mmol/L (ref 98–111)
Creatinine, Ser: 1.14 mg/dL (ref 0.61–1.24)
GFR, Estimated: >60 mL/min (ref >60)
Glucose, Bld: 139 mg/dL — ABNORMAL HIGH (ref 70–99)
Potassium: 4.2 mmol/L (ref 3.5–5.1)
Sodium: 138 mmol/L (ref 135–145)
Total Bilirubin: 0.8 mg/dL (ref 0.0–1.2)
Total Protein: 6.8 g/dL (ref 6.5–8.1)

## 2023-09-14 LAB — GLUCOSE, CAPILLARY
Glucose-Capillary: 88 mg/dL (ref 70–99)
Glucose-Capillary: 144 mg/dL — ABNORMAL HIGH (ref 70–99)

## 2023-09-14 MED ORDER — SODIUM CHLORIDE 0.9 % IV SOLN
150.0000 mg | Freq: Two times a day (BID) | INTRAVENOUS | Status: DC
  Administered 2023-09-14 – 2023-09-15 (×2): 150 mg via INTRAVENOUS
  Filled 2023-09-14 (×4): qty 15

## 2023-09-14 MED ORDER — ASPIRIN 81 MG PO CHEW
81.0000 mg | CHEWABLE_TABLET | Freq: Every day | ORAL | Status: DC
  Administered 2023-09-14 – 2023-09-15 (×2): 81 mg via ORAL
  Filled 2023-09-14 (×2): qty 1

## 2023-09-14 MED ORDER — INSULIN ASPART 100 UNIT/ML IJ SOLN
0.0000 [IU] | INTRAMUSCULAR | Status: DC
  Administered 2023-09-15: 1 [IU] via SUBCUTANEOUS

## 2023-09-14 MED ORDER — ACETAMINOPHEN 650 MG RE SUPP: 650.0000 mg | Freq: Four times a day (QID) | RECTAL | Status: DC | PRN

## 2023-09-14 MED ORDER — ENSURE ENLIVE PO LIQD
237.0000 mL | Freq: Two times a day (BID) | ORAL | Status: DC
  Administered 2023-09-15 – 2023-09-17 (×4): 237 mL via ORAL

## 2023-09-14 MED ORDER — SODIUM CHLORIDE 0.9% FLUSH
3.0000 mL | Freq: Two times a day (BID) | INTRAVENOUS | Status: DC
  Administered 2023-09-14 – 2023-09-20 (×12): 3 mL via INTRAVENOUS

## 2023-09-14 MED ORDER — ASPIRIN 300 MG RE SUPP
300.0000 mg | Freq: Every day | RECTAL | Status: DC
  Filled 2023-09-14: qty 1

## 2023-09-14 MED ORDER — LAMOTRIGINE 25 MG PO TABS
150.0000 mg | ORAL_TABLET | Freq: Two times a day (BID) | ORAL | Status: DC
  Administered 2023-09-15: 150 mg via ORAL
  Filled 2023-09-14: qty 2

## 2023-09-14 MED ORDER — SODIUM CHLORIDE 0.9 % IV SOLN
100.0000 mg | Freq: Once | INTRAVENOUS | Status: AC
  Administered 2023-09-14: 100 mg via INTRAVENOUS
  Filled 2023-09-14: qty 10

## 2023-09-14 MED ORDER — SODIUM CHLORIDE 0.9 % IV SOLN: INTRAVENOUS | Status: AC

## 2023-09-14 MED ORDER — ACETAMINOPHEN 325 MG PO TABS: 650.0000 mg | ORAL_TABLET | Freq: Four times a day (QID) | ORAL | Status: DC | PRN

## 2023-09-14 MED ORDER — LAMOTRIGINE 25 MG PO TABS: 150.0000 mg | ORAL_TABLET | Freq: Two times a day (BID) | ORAL | Status: DC

## 2023-09-14 MED ORDER — ENOXAPARIN SODIUM 40 MG/0.4ML IJ SOSY
40.0000 mg | PREFILLED_SYRINGE | INTRAMUSCULAR | Status: DC
Start: 2023-09-15 — End: 2023-09-15
  Administered 2023-09-15: 40 mg via SUBCUTANEOUS
  Filled 2023-09-14: qty 0.4

## 2023-09-14 NOTE — Assessment & Plan Note (Signed)
-   Wife notes that ever since the seizure during prior hospitalization he has had more difficulty with gait imbalance.  Previously he could walk unassisted and over the past week has required essentially contact-guard assist while ambulating - Follow-up MRI brain and EEG - PT/OT consulted

## 2023-09-14 NOTE — Assessment & Plan Note (Signed)
-   follows with Dr. Barbaraann Cao, notes reviewed - treated with avastin

## 2023-09-14 NOTE — Consult Note (Addendum)
 Neurology Consultation Reason for Consult: ams Referring Physician: Dr Lewie Chamber  CC: ams  History is obtained from: patient, wife, chart review  HPI: Alan Wise is a 53 y.o. 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 Patient was recently seen on 09/09/2022 for a breakthrough seizure  as well as acute stroke. Per wife, since discharge he has been confused, dizzy and has trouble walking. This morning he was not able to take his meds and therefore was seen by Dr Barbaraann Cao via tele. He recommended admission for repeat MRI as well as overnight video EEG. Wife states he had an episode of right leg jerking for about a minute while coming back from MRI and isnt sure if that was another seizure  ROS: All other systems reviewed and negative except as noted in the HPI.  Past Medical History:  Diagnosis Date   Allergy    Complication of anesthesia    pt reports he is starting to have more difficulty arousing after surgery   Diabetes mellitus (HCC)    GERD (gastroesophageal reflux disease)    Headache    History of kidney stones    Hyperlipidemia    Renal disorder    kidney stones    Family History  Problem Relation Age of Onset   Hyperlipidemia Father    Hypertension Father    Diabetes Father    Benign prostatic hyperplasia Father    Stroke Maternal Grandmother    Diabetes Maternal Grandmother    Stroke Paternal Grandmother    Hypertension Paternal Grandmother    Kidney disease Neg Hx    Prostate cancer Neg Hx     Social History:  reports that he has never smoked. He has never used smokeless tobacco. He reports that he does not currently use alcohol. He reports that he does not use drugs.   Medications Prior to Admission  Medication Sig Dispense Refill Last Dose/Taking    amLODipine (NORVASC) 5 MG tablet Take 1 tablet (5 mg total) by mouth daily. 30 tablet 2 09/13/2023   aspirin EC 81 MG tablet Take 1 tablet (81 mg total) by mouth daily. Swallow whole. 30 tablet 12 09/13/2023   atorvastatin (LIPITOR) 40 MG tablet Take 1 tablet (40 mg total) by mouth daily. 30 tablet 3 09/13/2023   cholecalciferol (VITAMIN D3) 25 MCG (1000 UNIT) tablet Take 1 tablet (1,000 Units total) by mouth daily. 30 tablet 2 Past Week   diazePAM, 15 MG Dose, (VALTOCO 15 MG DOSE) 2 x 7.5 MG/0.1ML LQPK Place 15 mg into the nose once as needed for up to 1 dose (seizure). 2 each 0 Unknown   lamoTRIgine (LAMICTAL) 150 MG tablet Take 1 tablet (150 mg total) by mouth 2 (two) times daily. 60 tablet 2 09/13/2023   propranolol (INDERAL) 20 MG tablet TAKE 1 TABLET BY MOUTH TWICE A DAY 60 tablet 1 09/13/2023      Exam: Current vital signs: BP (!) 160/85 (BP Location: Left Arm)   Pulse (!) 59   Temp 97.8 F (36.6 C) (Oral)   Resp 16   Ht 5\' 8"  (1.727 m)   Wt 67.2 kg   SpO2 100%   BMI 22.53 kg/m  Vital signs in last 24 hours: Temp:  [97.8 F (36.6 C)-98 F (36.7 C)] 97.8 F (36.6 C) (03/24 1445) Pulse Rate:  [58-59] 59 (03/24 1445) Resp:  [  16] 16 (03/24 1445) BP: (150-160)/(85-91) 160/85 (03/24 1445) SpO2:  [100 %] 100 % (03/24 1445) Weight:  [67.2 kg] 67.2 kg (03/24 1023)   Physical Exam  Constitutional: Appears well-developed and well-nourished.  Psych: Affect appropriate to situation Neuro: awake, alert, oriented to person, place: [place], not to time, can name objects and follow commands, left hemianopia ( baseline), rest CN grossly intact, 4/5 in all extremities with left hemiparesis  I have reviewed labs in epic and the results pertinent to this consultation are: CBC:  Recent Labs  Lab 09/09/23 1240 09/14/23 1039  WBC 7.1 10.2  NEUTROABS  --  7.6  HGB 15.8 15.4  HCT 44.3 44.7  MCV 83.0 85.1  PLT 210 208    Basic Metabolic Panel:  Lab Results  Component Value Date   NA  138 09/14/2023   K 4.2 09/14/2023   CO2 30 09/14/2023   GLUCOSE 139 (H) 09/14/2023   BUN 15 09/14/2023   CREATININE 1.14 09/14/2023   CALCIUM 9.2 09/14/2023   GFRNONAA >60 09/14/2023   GFRAA 123 12/05/2020   Lipid Panel:  Lab Results  Component Value Date   LDLCALC 156 (H) 09/10/2023   HgbA1c:  Lab Results  Component Value Date   HGBA1C 6.3 (H) 08/19/2023   Urine Drug Screen: No results found for: "LABOPIA", "COCAINSCRNUR", "LABBENZ", "AMPHETMU", "THCU", "LABBARB"  Alcohol Level     Component Value Date/Time   ETH <10 05/13/2023 2116     I have reviewed the images obtained:  MRI Brain wo  contrast 09/10/2023:   1.No acute intracranial abnormality. 2. Previously described subcentimeter areas of diffusion signal abnormality (seen on recent brain MRI from 09/07/2023) involving the anterior genu of the corpus callosum, right insula, and mesial left temporal lobe are slightly decreased in prominence from prior,likely reflecting evolving subacute infarcts. No associated hemorrhage or mass effect. No areas of new or interval infarction.  3. Additional 7 mm focus of diffusion signal abnormality at the right cerebral peduncle is relatively stable, and remains indeterminate, possibly reflecting ischemic change and/or tumor involvement. Attention at follow-up recommended. 4. Multifocal signal abnormality involving the right frontal and temporal lobes, consistent with known brain tumor. Overall appearance is not significantly changed, with no evidence for new or interval hemorrhage by MRI. 5. Underlying atrophy with chronic microvascular ischemic disease,with a few scattered remote lacunar infarcts about the pons.   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   Acute  encephalopathy Dizziness Dyssphagia - due to stroke vs seizure   Recommendations: - Will order vimpat  150mg  BID if unable to take lamotrigine 150mg  BID - LTM eeg to look for seizures - repeat MRI brain to look for stroke - Per Dr Barbaraann Cao, will consider steroids depending on mri results - TEE and possible anticoagulation after MRI results - discussed plan with wife and Dr Frederick Peers   Thank you for allowing [place] to participate in the care of this patient. If you have any further questions, please contact  me or neurohospitalist.   Lindie Spruce Epilepsy [facility]

## 2023-09-14 NOTE — Assessment & Plan Note (Signed)
-   Last A1c 6.3% on 08/19/2023 - Continue SSI and CBG monitoring

## 2023-09-14 NOTE — ED Provider Notes (Signed)
 [place] EMERGENCY DEPARTMENT AT [facility] Provider Note   CSN: [id number] Arrival date & time: 09/14/23  1013     History  Chief Complaint  Patient presents with   Altered Mental Status    Alan Wise is a 53 y.o. male.   Altered Mental Status Patient with history of low-grade glioma.  Recent admission to [place] discharge on the 21st found to have acute strokes.  Since going home has worsened.  Has baseline rather severe aphasia.  Now unable to walk and unable to take his pills this morning.  Had telephone visit with Dr. Barbaraann Cao from neuro-oncology today and sent in for further evaluation and treatment.  Plan was for MRI and potentially long-term epileptic monitoring.  Also may need PT OT and potentially more placement.  Not a TNK candidate due to symptoms having been there prior to previous admission.    Past Medical History:  Diagnosis Date   Allergy    Complication of anesthesia    pt reports he is starting to have more difficulty arousing after surgery   Diabetes mellitus (HCC)    GERD (gastroesophageal reflux disease)    Headache    History of kidney stones    Hyperlipidemia    Renal disorder    kidney stones    Home Medications Prior to Admission medications   Medication Sig Start Date End Date Taking? Authorizing Provider  amLODipine (NORVASC) 5 MG tablet Take 1 tablet (5 mg total) by mouth daily. 07/09/23  Yes Vaslow, Georgeanna Lea, MD  aspirin EC 81 MG tablet Take 1 tablet (81 mg total) by mouth daily. Swallow whole. 09/08/23  Yes Vaslow, Georgeanna Lea, MD  atorvastatin (LIPITOR) 40 MG tablet Take 1 tablet (40 mg total) by mouth daily. 09/08/23  Yes Vaslow, Georgeanna Lea, MD  cholecalciferol (VITAMIN D3) 25 MCG (1000 UNIT) tablet Take 1 tablet (1,000 Units total) by mouth daily. 08/10/23  Yes Vaslow, Georgeanna Lea, MD  diazePAM, 15 MG Dose, (VALTOCO 15 MG DOSE) 2 x 7.5 MG/0.1ML LQPK Place 15 mg into the nose once as needed for up to 1 dose (seizure). 05/15/23   Yes Azucena Fallen, MD  lamoTRIgine (LAMICTAL) 150 MG tablet Take 1 tablet (150 mg total) by mouth 2 (two) times daily. 09/07/23  Yes Vaslow, Georgeanna Lea, MD  propranolol (INDERAL) 20 MG tablet TAKE 1 TABLET BY MOUTH TWICE A DAY 08/20/23  Yes Vaslow, Georgeanna Lea, MD      Allergies    Contrast media [iodinated contrast media]    Review of Systems   Review of Systems  Physical Exam Updated Vital Signs BP (!) 160/85 (BP Location: Left Arm)   Pulse (!) 59   Temp 97.8 F (36.6 C) (Oral)   Resp 16   Ht 5\' 8"  (1.727 m)   Wt 67.2 kg   SpO2 100%   BMI 22.53 kg/m  Physical Exam Vitals reviewed.  Neurological:     Mental Status: He is alert.     Comments: In bed.  Most history comes from wife.  Rather severe expressive aphasia.     ED Results / Procedures / Treatments   Labs (all labs ordered are listed, but only abnormal results are displayed) Labs Reviewed  COMPREHENSIVE METABOLIC PANEL - Abnormal; Notable for the following components:      Result Value   Glucose, Bld 139 (*)    All other components within normal limits  CBC WITH DIFFERENTIAL/PLATELET - Abnormal; Notable for the following components:  Eosinophils Absolute 1.0 (*)    All other components within normal limits  LAMOTRIGINE LEVEL    EKG None  Radiology No results found.  Procedures Procedures    Medications Ordered in ED Medications  lamoTRIgine (LAMICTAL) tablet 150 mg (has no administration in time range)  insulin aspart (novoLOG) injection 0-6 Units (has no administration in time range)  lacosamide (VIMPAT) 100 mg in sodium chloride 0.9 % 25 mL IVPB (0 mg Intravenous Stopped 09/14/23 1318)    ED Course/ Medical Decision Making/ A&P                                 Medical Decision Making Amount and/or Complexity of Data Reviewed Labs: ordered. Radiology: ordered.  Risk Decision regarding hospitalization.   Patient sent in by neuro-oncology for further workup.  Will get MRI to evaluate  for stroke versus intracranial hemorrhage.  Also has not had his morning Lamictal dose.  Have called neuro-oncology but have not heard back yet.  Will also get some basic blood work.  Reviewed recent discharge note.  Discussed with Dr. Barbaraann Cao.  Noncon MRI to be done to evaluate for stroke versus potentially bleeding.  Will give Vimpat since unable to tolerate oral Lamictal at this time.  Discussed with Dr.Bhagat and states since already has had neuro oncology eval today may be seen by admitting team.  Will need admission for decreasing physical status no matter what the MRI shows.  Not a TNK candidate due to symptoms for days now.  Will discuss with hospitalist for admission.       Final Clinical Impression(s) / ED Diagnoses Final diagnoses:  Dizziness    Rx / DC Orders ED Discharge Orders     None         Benjiman Core, MD 09/14/23 [phone]

## 2023-09-14 NOTE — Progress Notes (Signed)
 I connected with Alan Wise on 09/14/23 at  9:00 AM EDT by telephone visit and verified that I am speaking with the correct person using two identifiers.  I discussed the limitations, risks, security and privacy concerns of performing an evaluation and management service by telemedicine and the availability of in-person appointments. I also discussed with the patient that there may be a patient responsible charge related to this service. The patient expressed understanding and agreed to proceed.   Other persons participating in the visit and their role in the encounter:  spouse, [name]  Patient's location:  Home Provider's location:  Office Chief Complaint:  Cerebrovascular accident (CVA), unspecified mechanism (HCC)  Seizure disorder (HCC)  Radiation therapy induced brain necrosis  Low grade glioma of cerebrum (HCC)  History of Present Ilness: Alan Wise history is obtained via spouse due to aphasia.  He has exhibited decline in function since the hospitalization and discharge from [facility].  His balance is worse, left side is weaker.  He is having more difficulty swallowing and speech is more garbled.  He was unable to swallow his pills this morning.  Wife attributes beginning of decline to following his >20 minutes convulsion on 09/09/23.    Observations: Language and cognition at baseline  Assessment and Plan: Cerebrovascular accident (CVA), unspecified mechanism (HCC)  Seizure disorder (HCC)  Radiation therapy induced brain necrosis  Low grade glioma of cerebrum (HCC)  Ongoing clinical decline, or failure to return to baseline with disabling functional impairment.  Etiology is either additional infarcts, subclinical seizures, or prolonged post-ictal enephalopathy.  Recommendations: -Return to ED, this time [facility] due to availability of LTM EEG -Repeat CNS imaging again to eval for new infarcts and/or hemorrhage -If ongoing new infarcts, agreeable with TEE, but  anticoagulation should be strongly considered regardless given potential avastin effects. -If MRI unremarkable, would benefit from 24-48h EEG monitoring -To consider dexamethasone as well -Careful PT, OT speech assessments.  May need dedicated inpt rehab medium term  Follow Up Instructions:  TBD  I discussed the assessment and treatment plan with the patient.  The patient was provided an opportunity to ask questions and all were answered.  The patient agreed with the plan and demonstrated understanding of the instructions.    The patient was advised to call back or seek an in-person evaluation if the symptoms worsen or if the condition fails to improve as anticipated.    Henreitta Leber, MD   I provided 30 minutes of non face-to-face telephone visit time during this encounter, and > 50% was spent counseling as documented under my assessment & plan.

## 2023-09-14 NOTE — Hospital Course (Signed)
 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 They discussed new symptoms with Dr. Barbaraann Cao and he was recommended for presenting for long-term EEG and repeat CVA workup. He was just hospitalized from 09/09/2023 until 09/11/2023 for new CVA and breakthrough seizure (only missed 1 dose of Lamictal prior to seizure onset). TEE was attempted during hospitalization as well but aborted due to difficult anatomy with plans for outpatient TEE.  Due to above symptoms, he was recommended for MRI brain and long-term EEG as noted. His aphasia is at baseline and is consistent with expressive aphasia and he can answer yes/no questions usually pretty easily.

## 2023-09-14 NOTE — Plan of Care (Signed)
  Problem: Education: Goal: Knowledge of General Education information will improve Description: Including pain rating scale, medication(s)/side effects and non-pharmacologic comfort measures Outcome: Progressing   Problem: Health Behavior/Discharge Planning: Goal: Ability to manage health-related needs will improve Outcome: Progressing   Problem: Clinical Measurements: Goal: Will remain free from infection Outcome: Progressing Goal: Diagnostic test results will improve Outcome: Progressing Goal: Cardiovascular complication will be avoided Outcome: Progressing   Problem: Activity: Goal: Risk for activity intolerance will decrease Outcome: Progressing   Problem: Coping: Goal: Level of anxiety will decrease Outcome: Progressing   Problem: Elimination: Goal: Will not experience complications related to bowel motility Outcome: Progressing Goal: Will not experience complications related to urinary retention Outcome: Progressing   Problem: Skin Integrity: Goal: Risk for impaired skin integrity will decrease Outcome: Progressing

## 2023-09-14 NOTE — Assessment & Plan Note (Addendum)
-   Wife reports that he has had progressive difficulty ingesting solids or liquids despite various consistencies being trialed.  Overall she states it appears he is having more difficulty swallowing - Last real meal was on Thursday per wife - SLP eval placed - Follow-up MRI brain as well - will try dysphagia 3 diet for now until SLP eval

## 2023-09-14 NOTE — Progress Notes (Signed)
 LTM EEG hooked up and running - no initial skin breakdown - push button tested - [facility] monitoring. MRI compatible leads used.

## 2023-09-14 NOTE — Assessment & Plan Note (Signed)
-   Mild bradycardia noted - On amlodipine at home; hold for now and resume pending MRI results

## 2023-09-14 NOTE — H&P (Addendum)
 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 They discussed new symptoms with Dr. Barbaraann Wise and he was recommended for presenting for long-term EEG and repeat CVA workup. He was just hospitalized from 09/09/2023 until 09/11/2023 for new CVA and breakthrough seizure (only missed 1 dose of Lamictal prior to seizure onset). TEE was attempted during hospitalization as well but aborted due to difficult anatomy with plans for outpatient TEE.  Due to above symptoms, he was recommended for MRI brain and long-term EEG as noted. His aphasia is at baseline and is consistent with expressive aphasia and he can answer yes/no questions usually pretty easily.   I have personally briefly reviewed patient's old medical records in [facility] and discussed patient with the ER provider when appropriate/indicated.  Assessment and Plan: * Dysphagia - Wife reports that he has had progressive difficulty ingesting solids or liquids despite various consistencies being trialed.  Overall she states it appears he is having more difficulty swallowing - Last real meal was on Thursday per wife - SLP eval placed - Follow-up MRI brain as well - will try dysphagia 3 diet for now until SLP eval   Seizure disorder (HCC) - Compliant on Lamictal -Previously missed only 1 dose prior to breakthrough seizure on 09/09/2023; wife report orts rare breakthrough seizures, last 1 was small and on 06/24/2023 and does not think was  associated with missed dose -Neurology consulted, appreciate assistance - Follow-up continuous EEG - received vimpat in ER; will try continuing lamictal but if unable to swallow will revert back to alternative via IV - check lamictal level  Gait disorder - Wife notes that ever since the seizure during prior hospitalization he has had more difficulty with gait imbalance.  Previously he could walk unassisted and over the past week has required essentially contact-guard assist while ambulating - Follow-up MRI brain and EEG - PT/OT consulted  Oligodendroglioma (HCC) - follows with Dr. Barbaraann Wise, notes reviewed - treated with avastin   History of CVA (cerebrovascular accident) - Last MRI brain, 09/09/2023 -Has been continued on aspirin daily since prior discharge - resume asa  -follow up pending SLP eval; lovenox for DVT ppx too for now - prior TEE attempted 09/11/23, but unable due to difficult anatomy (needs peds scope and was unavail at [facility] at the time); is planned for eventual TEE and/or need for anticoagulation; can consider accomplishing TEE while hospitalized as well once above workup further completed   Essential hypertension - Mild bradycardia noted - On amlodipine at home; hold for now and resume pending MRI results  Radiation therapy induced brain necrosis - noted in setting of right frontal oligodendroglioma  Hyperlipidemia associated with type 2 diabetes mellitus (HCC) - On Lipitor at home - Resume pending SLP eval  Type 2 diabetes mellitus with hyperglycemia (HCC) - Last A1c 6.3% on 08/19/2023 - Continue SSI and CBG monitoring    Code Status:     Code Status: Full Code  DVT Prophylaxis: Lovenox   enoxaparin (LOVENOX) injection 40  mg Start: 09/15/23 1000   Anticipated disposition is to: Pending PT/OT evals  History: Past Medical History:  Diagnosis Date   Allergy    Complication of anesthesia    pt reports he is starting to have more difficulty arousing after  surgery   Diabetes mellitus (HCC)    GERD (gastroesophageal reflux disease)    Headache    History of kidney stones    Hyperlipidemia    Renal disorder    kidney stones    Past Surgical History:  Procedure Laterality Date   APPLICATION OF CRANIAL NAVIGATION Right 01/17/2021   Procedure: APPLICATION OF CRANIAL NAVIGATION;  Surgeon: Bedelia Person, MD;  Location: [facility] OR;  Service: Neurosurgery;  Laterality: Right;   COLONOSCOPY  09/03/1994   COLONOSCOPY WITH PROPOFOL N/A 01/07/2023   Procedure: COLONOSCOPY WITH PROPOFOL;  Surgeon: [name], MD;  Location: [facility] ENDOSCOPY;  Service: Endoscopy;  Laterality: N/A;  NOT TOO EARLY   CYSTOSCOPY WITH STENT PLACEMENT Right 01/25/2016   Procedure: CYSTOSCOPY WITH STENT PLACEMENT;  Surgeon: Hildred Laser, MD;  Location: [facility] ORS;  Service: Urology;  Laterality: Right;   FRAMELESS  BIOPSY WITH BRAINLAB Right 01/17/2021   Procedure: RIGHT STEREOTACTIC BIOPSY OF INSULAR LESION;  Surgeon: Bedelia Person, MD;  Location: [facility] OR;  Service: Neurosurgery;  Laterality: Right;   KNEE ARTHROSCOPY W/ MENISCAL REPAIR Right 06/23/1988   POLYPECTOMY  01/07/2023   Procedure: POLYPECTOMY;  Surgeon: [name], MD;  Location: [facility] ENDOSCOPY;  Service: Endoscopy;;   removal of birthmark  06/08/2013   SKIN CANCER EXCISION  06/23/2012   TYMPANOSTOMY TUBE PLACEMENT  08/06/1978   URETEROSCOPY WITH HOLMIUM LASER LITHOTRIPSY Right 01/25/2016   Procedure: URETEROSCOPY WITH HOLMIUM LASER LITHOTRIPSY;  Surgeon: Hildred Laser, MD;  Location: [facility] ORS;  Service: Urology;  Laterality: Right;   URETHRAL STRICTURE DILATATION     visual inspection of vocal cord     1973   WISDOM TOOTH EXTRACTION       reports that he has never smoked. He has never used smokeless tobacco. He reports that he does not currently use alcohol. He reports that he does not use drugs.  Allergies  Allergen Reactions   Contrast Media [Iodinated Contrast Media] Swelling    Family  History  Problem Relation Age of Onset   Hyperlipidemia Father    Hypertension Father    Diabetes Father    Benign prostatic hyperplasia Father    Stroke Maternal Grandmother    Diabetes Maternal Grandmother    Stroke Paternal Grandmother    Hypertension Paternal Grandmother    Kidney disease Neg Hx    Prostate cancer Neg Hx     Home Medications: Prior to Admission medications   Medication Sig Start Date End Date Taking? Authorizing Provider  amLODipine (NORVASC) 5 MG tablet Take 1 tablet (5 mg total) by mouth daily. 07/09/23  Yes Vaslow, Georgeanna Lea, MD  aspirin EC 81 MG tablet Take 1 tablet (81 mg total) by mouth daily. Swallow whole. 09/08/23  Yes Vaslow, Georgeanna Lea, MD  atorvastatin (LIPITOR) 40 MG tablet Take 1 tablet (40 mg total) by mouth daily. 09/08/23  Yes Vaslow, Georgeanna Lea, MD  cholecalciferol (VITAMIN D3) 25 MCG (1000 UNIT) tablet Take 1 tablet (1,000 Units total) by mouth daily. 08/10/23  Yes Vaslow, Georgeanna Lea, MD  diazePAM, 15 MG Dose, (VALTOCO 15 MG DOSE) 2 x 7.5 MG/0.1ML LQPK Place 15 mg into the nose once as needed for up to 1 dose (seizure). 05/15/23  Yes Natale Milch,  Kristine Garbe, MD  lamoTRIgine (LAMICTAL) 150 MG tablet Take 1 tablet (150 mg total) by mouth 2 (two) times daily. 09/07/23  Yes Vaslow, Georgeanna Lea, MD  propranolol (INDERAL) 20 MG tablet TAKE 1 TABLET BY MOUTH TWICE A DAY 08/20/23  Yes Vaslow, Georgeanna Lea, MD    Review of Systems:  Review of Systems  Constitutional: Negative.   HENT: Negative.    Eyes:  Positive for blurred vision (slowly worsening in left eye).  Respiratory: Negative.    Cardiovascular: Negative.   Gastrointestinal: Negative.   Genitourinary: Negative.   Musculoskeletal: Negative.   Skin: Negative.   Neurological:  Positive for weakness.  Endo/Heme/Allergies: Negative.   Psychiatric/Behavioral: Negative.      Physical Exam:  Vitals:   09/14/23 1022 09/14/23 1023 09/14/23 1445  BP: (!) 150/91  (!) 160/85  Pulse: (!) 58  (!) 59  Resp: 16   16  Temp: 98 F (36.7 C)  97.8 F (36.6 C)  TempSrc: Oral  Oral  SpO2: 100%  100%  Weight:  67.2 kg   Height:  5\' 8"  (1.727 m)    Physical Exam Constitutional:      General: He is not in acute distress.    Appearance: Normal appearance.  HENT:     Head: Normocephalic and atraumatic.     Mouth/Throat:     Mouth: Mucous membranes are moist.  Eyes:     Extraocular Movements: Extraocular movements intact.     Pupils: Pupils are equal, round, and reactive to light.     Comments: Slowly worsening left visual acuity and decrease in peripheral vision  Cardiovascular:     Rate and Rhythm: Normal rate and regular rhythm.  Pulmonary:     Effort: Pulmonary effort is normal. No respiratory distress.     Breath sounds: Normal breath sounds. No wheezing.  Abdominal:     General: Bowel sounds are normal. There is no distension.     Palpations: Abdomen is soft.     Tenderness: There is no abdominal tenderness.  Musculoskeletal:        General: Normal range of motion.     Cervical back: Normal range of motion and neck supple.  Skin:    General: Skin is warm and dry.  Neurological:     Mental Status: He is alert.     Motor: Weakness (LUE 4/5, LLE 4/5) present.     Comments: expressive aphasia noted      Labs on Admission:  I have personally reviewed following labs and imaging studies Results for orders placed or performed during the hospital encounter of 09/14/23 (from the past 24 hours)  Comprehensive metabolic panel     Status: Abnormal   Collection Time: 09/14/23 10:39 AM  Result Value Ref Range   Sodium 138 135 - 145 mmol/L   Potassium 4.2 3.5 - 5.1 mmol/L   Chloride 102 98 - 111 mmol/L   CO2 30 22 - 32 mmol/L   Glucose, Bld 139 (H) 70 - 99 mg/dL   BUN 15 6 - 20 mg/dL   Creatinine, Ser 0.25 0.61 - 1.24 mg/dL   Calcium 9.2 8.9 - 42.7 mg/dL   Total Protein 6.8 6.5 - 8.1 g/dL   Albumin 4.1 3.5 - 5.0 g/dL   AST 20 15 - 41 U/L   ALT 22 0 - 44 U/L   Alkaline Phosphatase 70 38 -  126 U/L   Total Bilirubin 0.8 0.0 - 1.2 mg/dL   GFR, Estimated >06 >23 mL/min  Anion gap 6 5 - 15  CBC with Differential     Status: Abnormal   Collection Time: 09/14/23 10:39 AM  Result Value Ref Range   WBC 10.2 4.0 - 10.5 K/uL   RBC 5.25 4.22 - 5.81 MIL/uL   Hemoglobin 15.4 13.0 - 17.0 g/dL   HCT 28.4 13.2 - 44.0 %   MCV 85.1 80.0 - 100.0 fL   MCH 29.3 26.0 - 34.0 pg   MCHC 34.5 30.0 - 36.0 g/dL   RDW 10.2 72.5 - 36.6 %   Platelets 208 150 - 400 K/uL   nRBC 0.0 0.0 - 0.2 %   Neutrophils Relative % 75 %   Neutro Abs 7.6 1.7 - 7.7 K/uL   Lymphocytes Relative 11 %   Lymphs Abs 1.1 0.7 - 4.0 K/uL   Monocytes Relative 4 %   Monocytes Absolute 0.4 0.1 - 1.0 K/uL   Eosinophils Relative 9 %   Eosinophils Absolute 1.0 (H) 0.0 - 0.5 K/uL   Basophils Relative 1 %   Basophils Absolute 0.1 0.0 - 0.1 K/uL   Immature Granulocytes 0 %   Abs Immature Granulocytes 0.04 0.00 - 0.07 K/uL  Glucose, capillary     Status: None   Collection Time: 09/14/23  4:26 PM  Result Value Ref Range   Glucose-Capillary 88 70 - 99 mg/dL   Comment 1 Notify RN    Comment 2 Document in Chart      Radiological Exams on Admission: No results found. MR BRAIN WO CONTRAST    (Results Pending)    Consults called:  Neurology    EKG: Independently reviewed. NSR   Lewie Chamber, MD [facility] 09/14/2023, 5:59 PM

## 2023-09-14 NOTE — Assessment & Plan Note (Signed)
-   On Lipitor at home - Resume pending SLP eval

## 2023-09-14 NOTE — Progress Notes (Signed)
 Patient currently with MRI and then being moved to a room on 3W. Tech will hookup patient when he is available in new room.

## 2023-09-14 NOTE — Assessment & Plan Note (Signed)
-   noted in setting of right frontal oligodendroglioma

## 2023-09-14 NOTE — ED Triage Notes (Signed)
 Patient presents to ed , wife states he was admitted to [facility] on Wed, and dx. With cva of which he was able to walk and speech was slurred , was sent home on Friday and wasn't able to walk and his wife states his swallowing is getting worse. Patient is alert able to speak in very short choppy words.

## 2023-09-14 NOTE — Assessment & Plan Note (Addendum)
-   Last MRI brain, 09/09/2023 -Has been continued on aspirin daily since prior discharge - resume asa  -follow up pending SLP eval; lovenox for DVT ppx too for now - prior TEE attempted 09/11/23, but unable due to difficult anatomy (needs peds scope and was unavail at [facility] at the time); is planned for eventual TEE and/or need for anticoagulation; can consider accomplishing TEE while hospitalized as well once above workup further completed

## 2023-09-14 NOTE — Assessment & Plan Note (Addendum)
-   Compliant on Lamictal -Previously missed only 1 dose prior to breakthrough seizure on 09/09/2023; wife report orts rare breakthrough seizures, last 1 was small and on 06/24/2023 and does not think was associated with missed dose -Neurology consulted, appreciate assistance - Follow-up continuous EEG - received vimpat in ER; will try continuing lamictal but if unable to swallow will revert back to alternative via IV - check lamictal level

## 2023-09-15 ENCOUNTER — Ambulatory Visit: Payer: 59 | Admitting: Speech Pathology

## 2023-09-15 ENCOUNTER — Inpatient Hospital Stay (HOSPITAL_COMMUNITY)

## 2023-09-15 DIAGNOSIS — Y842 Radiological procedure and radiotherapy as the cause of abnormal reaction of the patient, or of later complication, without mention of misadventure at the time of the procedure: Secondary | ICD-10-CM

## 2023-09-15 DIAGNOSIS — C719 Malignant neoplasm of brain, unspecified: Secondary | ICD-10-CM

## 2023-09-15 DIAGNOSIS — R42 Dizziness and giddiness: Principal | ICD-10-CM

## 2023-09-15 DIAGNOSIS — R569 Unspecified convulsions: Secondary | ICD-10-CM | POA: Diagnosis not present

## 2023-09-15 DIAGNOSIS — I6789 Other cerebrovascular disease: Secondary | ICD-10-CM | POA: Diagnosis not present

## 2023-09-15 DIAGNOSIS — I10 Essential (primary) hypertension: Secondary | ICD-10-CM

## 2023-09-15 DIAGNOSIS — E1165 Type 2 diabetes mellitus with hyperglycemia: Secondary | ICD-10-CM

## 2023-09-15 DIAGNOSIS — Z8673 Personal history of transient ischemic attack (TIA), and cerebral infarction without residual deficits: Secondary | ICD-10-CM

## 2023-09-15 DIAGNOSIS — E785 Hyperlipidemia, unspecified: Secondary | ICD-10-CM

## 2023-09-15 DIAGNOSIS — E1169 Type 2 diabetes mellitus with other specified complication: Secondary | ICD-10-CM | POA: Diagnosis not present

## 2023-09-15 DIAGNOSIS — I639 Cerebral infarction, unspecified: Secondary | ICD-10-CM | POA: Diagnosis not present

## 2023-09-15 DIAGNOSIS — R1310 Dysphagia, unspecified: Secondary | ICD-10-CM | POA: Diagnosis not present

## 2023-09-15 LAB — GLUCOSE, CAPILLARY
Glucose-Capillary: 113 mg/dL — ABNORMAL HIGH (ref 70–99)
Glucose-Capillary: 114 mg/dL — ABNORMAL HIGH (ref 70–99)
Glucose-Capillary: 128 mg/dL — ABNORMAL HIGH (ref 70–99)
Glucose-Capillary: 141 mg/dL — ABNORMAL HIGH (ref 70–99)
Glucose-Capillary: 142 mg/dL — ABNORMAL HIGH (ref 70–99)
Glucose-Capillary: 163 mg/dL — ABNORMAL HIGH (ref 70–99)

## 2023-09-15 LAB — LAMOTRIGINE LEVEL: Lamotrigine Lvl: 10.8 ug/mL (ref 2.0–20.0)

## 2023-09-15 MED ORDER — APIXABAN 5 MG PO TABS
5.0000 mg | ORAL_TABLET | Freq: Two times a day (BID) | ORAL | Status: DC
  Administered 2023-09-15 – 2023-09-20 (×10): 5 mg via ORAL
  Filled 2023-09-15 (×10): qty 1

## 2023-09-15 MED ORDER — SODIUM CHLORIDE 0.9 % IV SOLN
150.0000 mg | Freq: Two times a day (BID) | INTRAVENOUS | Status: DC
  Filled 2023-09-15: qty 15

## 2023-09-15 MED ORDER — GADOBUTROL 1 MMOL/ML IV SOLN
7.0000 mL | Freq: Once | INTRAVENOUS | Status: AC | PRN
  Administered 2023-09-15: 7 mL via INTRAVENOUS

## 2023-09-15 MED ORDER — SODIUM CHLORIDE 0.9 % IV SOLN: 150.0000 mg | Freq: Two times a day (BID) | INTRAVENOUS | Status: DC | PRN

## 2023-09-15 MED ORDER — PROPRANOLOL HCL 10 MG PO TABS
20.0000 mg | ORAL_TABLET | Freq: Two times a day (BID) | ORAL | Status: DC
  Administered 2023-09-15 – 2023-09-20 (×11): 20 mg via ORAL
  Filled 2023-09-15 (×11): qty 2

## 2023-09-15 MED ORDER — INSULIN ASPART 100 UNIT/ML IJ SOLN
0.0000 [IU] | Freq: Three times a day (TID) | INTRAMUSCULAR | Status: DC
  Administered 2023-09-15 – 2023-09-17 (×2): 1 [IU] via SUBCUTANEOUS
  Administered 2023-09-18: 2 [IU] via SUBCUTANEOUS
  Administered 2023-09-18: 1 [IU] via SUBCUTANEOUS
  Administered 2023-09-19: 2 [IU] via SUBCUTANEOUS

## 2023-09-15 MED ORDER — AMLODIPINE BESYLATE 5 MG PO TABS
5.0000 mg | ORAL_TABLET | Freq: Every day | ORAL | Status: DC
  Administered 2023-09-15: 5 mg via ORAL
  Filled 2023-09-15: qty 1

## 2023-09-15 MED ORDER — LAMOTRIGINE 25 MG PO TABS
150.0000 mg | ORAL_TABLET | Freq: Two times a day (BID) | ORAL | Status: DC
  Administered 2023-09-16: 150 mg via ORAL
  Filled 2023-09-15: qty 2

## 2023-09-15 MED ORDER — INSULIN ASPART 100 UNIT/ML IJ SOLN: 0.0000 [IU] | Freq: Every day | INTRAMUSCULAR | Status: DC

## 2023-09-15 MED ORDER — ATORVASTATIN CALCIUM 40 MG PO TABS
40.0000 mg | ORAL_TABLET | Freq: Every day | ORAL | Status: DC
  Administered 2023-09-15 – 2023-09-20 (×6): 40 mg via ORAL
  Filled 2023-09-15 (×6): qty 1

## 2023-09-15 NOTE — Progress Notes (Signed)
 PHARMACY - ANTICOAGULATION CONSULT NOTE  Pharmacy Consult for Apixaban Indication:  multiple infarcts and active malignancy  Allergies  Allergen Reactions   Contrast Media [Iodinated Contrast Media] Swelling    Patient Measurements: Height: 5\' 8"  (172.7 cm) Weight: 67.2 kg (148 lb 2.4 oz) IBW/kg (Calculated) : 68.4  Vital Signs: Temp: 98.3 F (36.8 C) (03/25 1530) Temp Source: Oral (03/25 1530) BP: 153/86 (03/25 1530) Pulse Rate: 66 (03/25 1530)  Labs: Recent Labs    09/14/23 1039  HGB 15.4  HCT 44.7  PLT 208  CREATININE 1.14    Estimated Creatinine Clearance: 72.9 mL/min (by C-G formula based on SCr of 1.14 mg/dL).   Medical History: Past Medical History:  Diagnosis Date   Allergy    Complication of anesthesia    pt reports he is starting to have more difficulty arousing after surgery   Diabetes mellitus (HCC)    GERD (gastroesophageal reflux disease)    Headache    History of kidney stones    Hyperlipidemia    Renal disorder    kidney stones    Medications:  Medications Prior to Admission  Medication Sig Dispense Refill Last Dose/Taking   amLODipine (NORVASC) 5 MG tablet Take 1 tablet (5 mg total) by mouth daily. 30 tablet 2 09/13/2023   aspirin EC 81 MG tablet Take 1 tablet (81 mg total) by mouth daily. Swallow whole. 30 tablet 12 09/13/2023   atorvastatin (LIPITOR) 40 MG tablet Take 1 tablet (40 mg total) by mouth daily. 30 tablet 3 09/13/2023   cholecalciferol (VITAMIN D3) 25 MCG (1000 UNIT) tablet Take 1 tablet (1,000 Units total) by mouth daily. 30 tablet 2 Past Week   diazePAM, 15 MG Dose, (VALTOCO 15 MG DOSE) 2 x 7.5 MG/0.1ML LQPK Place 15 mg into the nose once as needed for up to 1 dose (seizure). 2 each 0 Unknown   lamoTRIgine (LAMICTAL) 150 MG tablet Take 1 tablet (150 mg total) by mouth 2 (two) times daily. 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  Patient was found ot have multiple areas of infart.  Neurology and Oncology recommending anticoagulation with Apixaban.  Patient not on anticoagulation PTA (ASA only).   Goal of Therapy:  Therapeutic Anticoagulation Monitor platelets by anticoagulation protocol: Yes   Plan:  D/C Enoxaparin (last dose 3/25 am) Start Apixaban 5mg  PO BID tonight. D/C ASA (confirmed with Neuro NP Richardo Priest) Copay check in progress Apixaban education prior to discharge  Ashleymarie Granderson, [name] 09/15/2023,5:50 PM

## 2023-09-15 NOTE — Progress Notes (Signed)
 LTM maint complete - no skin breakdown Study placed on server from local, [facility] notified to monitor [facility] monitored, Event button test confirmed by [facility].

## 2023-09-15 NOTE — Evaluation (Signed)
 Clinical/Bedside Swallow Evaluation Patient Details  Name: Alan Wise MRN: [id number] Date of Birth: [birth year]  Today's Date: 09/15/2023 Time: SLP Start Time (ACUTE ONLY): 6578 SLP Stop Time (ACUTE ONLY): 1017 SLP Time Calculation (min) (ACUTE ONLY): 21 min  Past Medical History:  Past Medical History:  Diagnosis Date   Allergy    Complication of anesthesia    pt reports he is starting to have more difficulty arousing after surgery   Diabetes mellitus (HCC)    GERD (gastroesophageal reflux disease)    Headache    History of kidney stones    Hyperlipidemia    Renal disorder    kidney stones   Past Surgical History:  Past Surgical History:  Procedure Laterality Date   APPLICATION OF CRANIAL NAVIGATION Right 01/17/2021   Procedure: APPLICATION OF CRANIAL NAVIGATION;  Surgeon: Bedelia Person, MD;  Location: [facility] OR;  Service: Neurosurgery;  Laterality: Right;   COLONOSCOPY  09/03/1994   COLONOSCOPY WITH PROPOFOL N/A 01/07/2023   Procedure: COLONOSCOPY WITH PROPOFOL;  Surgeon: [name], MD;  Location: [facility] ENDOSCOPY;  Service: Endoscopy;  Laterality: N/A;  NOT TOO EARLY   CYSTOSCOPY WITH STENT PLACEMENT Right 01/25/2016   Procedure: CYSTOSCOPY WITH STENT PLACEMENT;  Surgeon: Hildred Laser, MD;  Location: [facility] ORS;  Service: Urology;  Laterality: Right;   FRAMELESS  BIOPSY WITH BRAINLAB Right 01/17/2021   Procedure: RIGHT STEREOTACTIC BIOPSY OF INSULAR LESION;  Surgeon: Bedelia Person, MD;  Location: [facility] OR;  Service: Neurosurgery;  Laterality: Right;   KNEE ARTHROSCOPY W/ MENISCAL REPAIR Right 06/23/1988   POLYPECTOMY  01/07/2023   Procedure: POLYPECTOMY;  Surgeon: [name], MD;  Location: [facility] ENDOSCOPY;  Service: Endoscopy;;   removal of birthmark  06/08/2013   SKIN CANCER EXCISION  06/23/2012   TYMPANOSTOMY TUBE PLACEMENT  08/06/1978   URETEROSCOPY WITH HOLMIUM LASER LITHOTRIPSY Right 01/25/2016   Procedure: URETEROSCOPY WITH HOLMIUM LASER LITHOTRIPSY;   Surgeon: Hildred Laser, MD;  Location: [facility] ORS;  Service: Urology;  Laterality: Right;   URETHRAL STRICTURE DILATATION     visual inspection of vocal cord     1973   WISDOM TOOTH EXTRACTION     HPI:  53 yr old presenting 3/24 with difficulty walking and swallowing. MRI brain revealed new punctate focus of restricted diffusion in the anterior insular cortex compatible with new acute/subacute infarct. Recent admission to [facility] ED 3/19-3/21 with AMS and new CVA. PMH includes oligodendroglioma, brain tumor with necrosis (radiation), h/o CVA with aphasia, DM, seizure disorder. Wife says aphasia initiated a year ago slowely decling but feels he is at his baseline with aphasia and is receiving outpatient ST per previous SLP note. BSE 09/10/23 initial cough with liquids not repeated with additional trials, complete mastication and clearance with regular textures. Recommmende regular/thin    Assessment / Plan / Recommendation  Clinical Impression  Pt had BSE on 3/20 with minimal signs of aspiration x1 and regular/thin recommended. Wife reports since discharge pt has had increased coughing with food and liquid, decreased sensation on left side of face and difficulty masticating. He has CN VII impairments with decreased ROM on left and decreased sensation. Pt is able to produce a moderately strong cough. He was observed with partiall breakfast tray and cued to masticate on right side. Mastication was timely with minimal residue on left side of tongue that he was able to clear. There were intermittent immediate coughs with thin liquids with and without vocalizing after swallow. Pt will need instrumental assessment with MBS recommended  once continuous EEG discontinued. Given that coughing was inconsistent recommend he continue thin liquids, small sips, not vocalizing immediately after swallowing. Continue Dys 3 texture and recommend pills whole in applesauce (was taking with thin but wife reporting difficulty).  Will continue to follow and perform MBS when EEG leads removed. SLP Visit Diagnosis: Dysphagia, unspecified (R13.10)    Aspiration Risk  Mild aspiration risk    Diet Recommendation Dysphagia 3 (Mech soft);Thin liquid    Liquid Administration via: Straw;Cup Medication Administration: Whole meds with puree Compensations: Slow rate;Small sips/bites;Other (Comment) (no phonating after swallow) Postural Changes: Seated upright at 90 degrees    Other  Recommendations Oral Care Recommendations: Oral care BID    Recommendations for follow up therapy are one component of a multi-disciplinary discharge planning process, led by the attending physician.  Recommendations may be updated based on patient status, additional functional criteria and insurance authorization.  Follow up Recommendations  (TBD)      Assistance Recommended at Discharge    Functional Status Assessment Patient has had a recent decline in their functional status and demonstrates the ability to make significant improvements in function in a reasonable and predictable amount of time.  Frequency and Duration min 2x/week  2 weeks       Prognosis Prognosis for improved oropharyngeal function:  (fair-good)      Swallow Study   General Date of Onset: 09/14/23 HPI: 53 yr old presenting 3/24 with difficulty walking and swallowing. MRI brain revealed new punctate focus of restricted diffusion in the anterior insular cortex compatible with new acute/subacute infarct. Recent admission to [facility] ED 3/19-3/21 with AMS and new CVA. PMH includes oligodendroglioma, brain tumor with necrosis (radiation), h/o CVA with aphasia, DM, seizure disorder. Wife says aphasia initiated a year ago slowely decling but feels he is at his baseline with aphasia and is receiving outpatient ST per previous SLP note. BSE 09/10/23 initial cough with liquids not repeated with additional trials, complete mastication and clearance with regular textures. Recommmende  regular/thin Type of Study: Bedside Swallow Evaluation Previous Swallow Assessment:  (see HPI) Diet Prior to this Study: Dysphagia 3 (mechanical soft);Thin liquids (Level 0) Temperature Spikes Noted: No Respiratory Status: Room air History of Recent Intubation: No Behavior/Cognition: Alert;Cooperative;Requires cueing (some frustration with communication difficulties) Oral Cavity Assessment: Within Functional Limits Oral Care Completed by SLP: No Oral Cavity - Dentition: Adequate natural dentition Vision: Functional for self-feeding Self-Feeding Abilities: Able to feed self Patient Positioning: Upright in bed Baseline Vocal Quality: Normal Volitional Cough: Strong    Oral/Motor/Sensory Function Overall Oral Motor/Sensory Function: Mild impairment Facial ROM: Reduced left;Suspected CN VII (facial) dysfunction Facial Symmetry: Abnormal symmetry left;Suspected CN VII (facial) dysfunction Facial Strength: Reduced left;Suspected CN VII (facial) dysfunction Facial Sensation: Reduced left;Suspected CN V (Trigeminal) dysfunction (pt denied however wife states he has intermittent left labial residue that he does not sense) Lingual ROM: Within Functional Limits   Ice Chips Ice chips: Not tested   Thin Liquid Thin Liquid: Impaired Presentation: Cup;Straw Oral Phase Impairments:  (none) Pharyngeal  Phase Impairments: Cough - Immediate    Nectar Thick Nectar Thick Liquid: Not tested   Honey Thick Honey Thick Liquid: Not tested   Puree Puree: Not tested   Solid     Solid: Impaired Presentation: Self Fed Oral Phase Functional Implications: Other (comment) (minimal left sided residue) Pharyngeal Phase Impairments:  (none)      Kyrra Prada, Breck Coons 09/15/2023,10:54 AM

## 2023-09-15 NOTE — Progress Notes (Signed)
 Went to room again, to d/c pt and family stated that they would like to see a provider before I remove leads. I let NP know and decision was made to leave pt on until the morning.

## 2023-09-15 NOTE — Procedures (Signed)
 Patient Name: Alan Wise  MRN: [id number]  Epilepsy Attending: Charlsie Quest  Referring Physician/Provider: Charlsie Quest, MD  Duration: 09/14/2023 1503 to 09/15/2023 1503  Patient history: 53yo M with hx of oligodendroglioma followed by Dr. 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 EEG to evaluate for seizure  Level of alertness: Awake, asleep  AEDs during EEG study: LCM  Technical aspects: This EEG study was done with scalp electrodes positioned according to the 10-20 International system of electrode placement. Electrical activity was reviewed with band pass filter of 1-70Hz , sensitivity of 7 uV/mm, display speed of 61mm/sec with a 60Hz  notched filter applied as appropriate. EEG data were recorded continuously and digitally stored.  Video monitoring was available and reviewed as appropriate.  Description: The posterior dominant rhythm consists of 8-9 Hz activity of moderate voltage (25-35 uV) seen predominantly in posterior head regions, asymmetric ( right<right )  and reactive to eye opening and eye closing. Sleep was characterized by vertex waves, sleep spindles (12 to 14 Hz), maximal frontocentral region. EEG showed continuous 3 to 6 Hz theta-delta slowing in right hemisphere, maximal right temporal region. Hyperventilation and photic stimulation were not performed.     Event button was pressed on 09/15/2023 at around 1015 and 1234 for right leg shaking and altered mental status.  Concomitant EEG before, during and after the event did not show any EEG changes suggest seizure.   ABNORMALITY - Continuous slow, right hemisphere, maximal right temporal region   IMPRESSION: This study is suggestive of cortical dysfunction arising from ight hemisphere, maximal right temporal region likely secondary to underlying structural abnormality.  No seizures or epileptiform discharges were seen throughout the recording.  Event button was pressed on 09/15/2023 at around 1015 and 1234 for right leg shaking and altered mental status without concomitant EEG change.  This could have been a nonepileptic event.  However focal motor seizures may not be seen on scalp EEG.  Therefore clinical correlation is recommended.   Patricio Popwell Annabelle Harman

## 2023-09-15 NOTE — Evaluation (Signed)
 Occupational Therapy Evaluation Patient Details Name: Alan Wise MRN: [id number] DOB: 06-Aug-1970 Today's Date: 09/15/2023   History of Present Illness   Pt presenting 3/24 with difficulty walking and swallowing. MRI brain revealed new punctate focus of restricted diffusion in the anterior insular cortex compatible with new acute/subacute infarct. Recent admission to [facility] ED 3/19-3/21 with AMS and new CVA. PMH includes oligodendroglioma, brain tumor with necrosis, h/o CVA with aphasia, DM, seizure disorder     Clinical Impressions Pt evaluated s/p the admission list above. At baseline, pt lives at home with wife and completes functional mobility, and ADLs independently without use of AD. Upon evaluation, pt was limited by weakness, L side visual deficits, LUE FM/dexterity impairments, and reduce clarity of speech. Pt completed bed mobility tasks with CGA-supervision and required up to CGA for functional mobility without use of AD in room with verbal cues to attend to L side. Pt able to complete grooming task standing supported at sink with CGA and donned socks onto B feet with setup assistance at bed level. Based on evaluation, pt will require up to [facility] for most ADLs and frequent assist for problem solving/ L inattention. OT to continue following pt acutely to address functional needs with discharge recommendations of HHOT to maximize functional independence and to ensure safe discharge to home environment.     If plan is discharge home, recommend the following:   A little help with walking and/or transfers;A little help with bathing/dressing/bathroom;Assistance with cooking/housework;Assist for transportation;Help with stairs or ramp for entrance;Direct supervision/assist for financial management;Direct supervision/assist for medications management     Functional Status Assessment   Patient has had a recent decline in their functional status and demonstrates the ability to make  significant improvements in function in a reasonable and predictable amount of time.     Equipment Recommendations   None recommended by OT     Recommendations for Other Services         Precautions/Restrictions   Precautions Precautions: Fall Restrictions Weight Bearing Restrictions Per Provider Order: No     Mobility Bed Mobility Overal bed mobility: Needs Assistance Bed Mobility: Supine to Sit, Sit to Supine     Supine to sit: Supervision, HOB elevated Sit to supine: Supervision, HOB elevated, Contact guard assist   General bed mobility comments: Pt completed task with verbal cues to continue scooting foward until B feet were flat on the floor.    Transfers Overall transfer level: Needs assistance Equipment used: None Transfers: Sit to/from Stand Sit to Stand: Contact guard assist           General transfer comment: CGA provided for safety      Balance Overall balance assessment: Needs assistance Sitting-balance support: No upper extremity supported, Feet supported Sitting balance-Leahy Scale: Good Sitting balance - Comments: static sitting EOB and able to weight, bend/reach to donn socks onto B feet while seated in bed   Standing balance support: No upper extremity supported, During functional activity Standing balance-Leahy Scale: Fair Standing balance comment: Pt completed grooming task while standing unsupported with CGA                           ADL either performed or assessed with clinical judgement   ADL Overall ADL's : Needs assistance/impaired Eating/Feeding: Independent;Sitting   Grooming: Wash/dry face;Oral care;Contact guard assist;Cueing for sequencing;Standing   Upper Body Bathing: Supervision/ safety;Sitting   Lower Body Bathing: Contact guard assist;Sit to/from stand;Sitting/lateral leans   Upper  Body Dressing : Supervision/safety;Sitting   Lower Body Dressing: Contact guard assist;Sitting/lateral leans;Sit  to/from stand   Toilet Transfer: Occupational hygienist and Hygiene: Contact guard assist;Sit to/from stand       Functional mobility during ADLs: Contact guard assist;Cueing for safety General ADL Comments: Pt has L eye visual deficits requiring verbal cues for sequencing and to attend to L side during functional mobility. Pt with FM impairments in L hand but was able to use functionally to complete grooming task. CGA provided for safety during functional mobility without use of AD     Vision Baseline Vision/History: 1 Wears glasses Ability to See in Adequate Light: 1 Impaired Patient Visual Report: Peripheral vision impairment (L eye) Vision Assessment?: Wears glasses for reading Additional Comments: L peripheral vision decr since January.     Perception Perception: Impaired Preception Impairment Details: Inattention/Neglect Perception-Other Comments: L side inattention   Praxis Praxis: Not tested       Pertinent Vitals/Pain Pain Assessment Pain Assessment: No/denies pain     Extremity/Trunk Assessment Upper Extremity Assessment Upper Extremity Assessment: Left hand dominant;LUE deficits/detail LUE Deficits / Details: decreased grip strength, able to flex L shoulder to ~100 degrees with reports pain, drift observed with flexion. Pt denies impairments in sensation LUE: Shoulder pain with ROM LUE Sensation: WNL LUE Coordination: decreased fine motor   Lower Extremity Assessment Lower Extremity Assessment: Defer to PT evaluation   Cervical / Trunk Assessment Cervical / Trunk Assessment: Normal   Communication Communication Communication: Impaired Factors Affecting Communication: Difficulty expressing self;Reduced clarity of speech   Cognition Arousal: Alert Behavior During Therapy: WFL for tasks assessed/performed Cognition: No apparent impairments, Difficult to assess Difficult to assess due to: Impaired  communication                             Following commands: Intact       Cueing  General Comments   Cueing Techniques: Verbal cues;Tactile cues  VSS on RA; wife at bedside   Exercises     Shoulder Instructions      Home Living Family/patient expects to be discharged to:: Private residence Living Arrangements: Spouse/significant other;Children (5 and 10 y.o.) Available Help at Discharge: Family;Available 24 hours/day (wife available 24/7 and mother lives close by. good family and friend support) Type of Home: House Home Access: Stairs to enter (stone steps) Secretary/administrator of Steps: 4 Entrance Stairs-Rails: Right;Left Home Layout: Two level;Bed/bath upstairs (can live on main level if needed) Alternate Level Stairs-Number of Steps: flight of steps to 2nd floor bedroom Alternate Level Stairs-Rails:  (L for first 5 and R on second 5) Bathroom Shower/Tub: Producer, television/film/video: Standard     Home Equipment: Shower seat - built in;Hand held shower head (no shower seat built in downstairs)          Prior Functioning/Environment Prior Level of Function : Needs assist             Mobility Comments: Pt reports ambulating without use of AD at baseline ADLs Comments: wife repprts decline since june; issues with eating. L handed and L weak so eating w LUE is difficult.  prior to 2 weeks ago able to do own bathing and dressing. since home from hospital on friday wife has been hanging on to pt to facilitate balance. since november of 2024 wife drives, does finances, medication managment    OT Problem List: Decreased strength;Decreased  range of motion;Decreased activity tolerance;Impaired balance (sitting and/or standing);Decreased coordination;Decreased safety awareness;Impaired UE functional use   OT Treatment/Interventions: Self-care/ADL training;Therapeutic exercise;Neuromuscular education;Therapeutic activities;Visual/perceptual  remediation/compensation;Patient/family education;Balance training      OT Goals(Current goals can be found in the care plan section)   Acute Rehab OT Goals Patient Stated Goal: to get better OT Goal Formulation: With patient/family Time For Goal Achievement: 09/29/23 Potential to Achieve Goals: Good ADL Goals Pt Will Perform Grooming: with supervision;standing Pt Will Perform Upper Body Dressing: with supervision;sitting Pt Will Perform Lower Body Dressing: with supervision;sit to/from stand Pt Will Transfer to Toilet: with supervision;ambulating;regular height toilet Pt Will Perform Toileting - Clothing Manipulation and hygiene: with supervision;sit to/from stand;sitting/lateral leans Pt/caregiver will Perform Home Exercise Program: Increased strength;Both right and left upper extremity;With theraputty;With written HEP provided;Independently Additional ADL Goal #1: Pt will implement visual scanning strategies to improve visual attention to L side to increase pt safety in functional tasks   OT Frequency:  Min 2X/week    Co-evaluation PT/OT/SLP Co-Evaluation/Treatment: Yes Reason for Co-Treatment: For patient/therapist safety;To address functional/ADL transfers   OT goals addressed during session: ADL's and self-care      AM-PAC OT "6 Clicks" Daily Activity     Outcome Measure Help from another person eating meals?: None Help from another person taking care of personal grooming?: A Little Help from another person toileting, which includes using toliet, bedpan, or urinal?: A Little Help from another person bathing (including washing, rinsing, drying)?: A Little Help from another person to put on and taking off regular upper body clothing?: A Little Help from another person to put on and taking off regular lower body clothing?: A Little 6 Click Score: 19   End of Session Equipment Utilized During Treatment: Gait belt Nurse Communication: Mobility status  Activity Tolerance:  Patient tolerated treatment well Patient left: in bed;with call bell/phone within reach;with bed alarm set;with family/visitor present  OT Visit Diagnosis: Unsteadiness on feet (R26.81);Other abnormalities of gait and mobility (R26.89);Muscle weakness (generalized) (M62.81);Hemiplegia and hemiparesis Hemiplegia - Right/Left: Left Hemiplegia - dominant/non-dominant: Dominant Hemiplegia - caused by: Cerebral infarction                Time: 9604-5409 OT Time Calculation (min): 26 min Charges:  OT General Charges $OT Visit: 1 Visit OT Evaluation $OT Eval Moderate Complexity: 1 [address] MOTS  Kevan Ny 09/15/2023, 10:17 AM

## 2023-09-15 NOTE — Progress Notes (Signed)
   09/15/23 1130  Spiritual Encounters  Type of Visit Attempt (pt unavailable)  Care provided to: Pt not available  Reason for visit Advance directives  OnCall Visit No

## 2023-09-15 NOTE — Progress Notes (Signed)
 LTM maint complete - no skin breakdown under:  A2,F8,F7

## 2023-09-15 NOTE — Evaluation (Signed)
 Physical Therapy Evaluation Patient Details Name: HERSHEY KNAUER MRN: [id number] DOB: 16-Jan-1971 Today's Date: 09/15/2023  History of Present Illness  Pt presenting 3/24 with difficulty walking and swallowing. MRI brain revealed new punctate focus of restricted diffusion in the anterior insular cortex compatible with new acute/subacute infarct. Recent admission to [facility] ED 3/19-3/21 with AMS and new CVA. PMH includes oligodendroglioma, brain tumor with necrosis, h/o CVA with aphasia, DM, seizure disorder  Clinical Impression  Pt presents with admitting diagnosis above. Co-treat with OT. Pt today was able to ambulate to sink and back with OT CGA no AD however further distance was limited due to pt being on EEG. PTA pt lives at home with his wife and 2 kids and reports ambulating well with no AD at baseline. Recommend HHPT upon DC with transition back to OP Neuro PT. PT will continue to follow. Patient needs to practice stairs next session.         If plan is discharge home, recommend the following: A little help with walking and/or transfers;A little help with bathing/dressing/bathroom;Assistance with cooking/housework;Assist for transportation;Help with stairs or ramp for entrance   Can travel by private vehicle        Equipment Recommendations None recommended by PT  Recommendations for Other Services       Functional Status Assessment Patient has had a recent decline in their functional status and demonstrates the ability to make significant improvements in function in a reasonable and predictable amount of time.     Precautions / Restrictions Precautions Precautions: Fall Restrictions Weight Bearing Restrictions Per Provider Order: No      Mobility  Bed Mobility Overal bed mobility: Needs Assistance Bed Mobility: Supine to Sit, Sit to Supine     Supine to sit: Supervision, HOB elevated Sit to supine: Supervision, HOB elevated, Contact guard assist   General bed mobility  comments: Pt completed task with verbal cues to continue scooting foward until B feet were flat on the floor.    Transfers Overall transfer level: Needs assistance Equipment used: None Transfers: Sit to/from Stand Sit to Stand: Contact guard assist           General transfer comment: CGA provided for safety    Ambulation/Gait Ambulation/Gait assistance: Contact guard assist, +2 safety/equipment Gait Distance (Feet): 10 Feet Assistive device: None Gait Pattern/deviations: Step-through pattern Gait velocity: decreased     General Gait Details: steady ambulation. Pt on EEG so limited to ambulation to sink with OT. +2 for management on lines and leads.  Stairs            Wheelchair Mobility     Tilt Bed    Modified Rankin (Stroke Patients Only)       Balance Overall balance assessment: Needs assistance Sitting-balance support: No upper extremity supported, Feet supported Sitting balance-Leahy Scale: Good Sitting balance - Comments: static sitting EOB and able to weight, bend/reach to donn socks onto B feet while seated in bed   Standing balance support: No upper extremity supported, During functional activity Standing balance-Leahy Scale: Fair Standing balance comment: Pt completed grooming task while standing unsupported with CGA                             Pertinent Vitals/Pain Pain Assessment Pain Assessment: No/denies pain    Home Living Family/patient expects to be discharged to:: Private residence Living Arrangements: Spouse/significant other;Children (5 and 10 y.o.) Available Help at Discharge: Family;Available 24 hours/day (wife available  24/7 and mother lives close by. good family and friend support) Type of Home: House Home Access: Stairs to enter (stone steps) Entrance Stairs-Rails: Doctor, general practice of Steps: 4 Alternate Level Stairs-Number of Steps: flight of steps to 2nd floor bedroom Home Layout: Two  level;Bed/bath upstairs (can live on main level if needed) Home Equipment: Shower seat - built in;Hand held shower head (no shower seat built in downstairs)      Prior Function Prior Level of Function : Needs assist             Mobility Comments: Pt reports ambulating without use of AD at baseline ADLs Comments: wife repprts decline since june; issues with eating. L handed and L weak so eating w LUE is difficult.  prior to 2 weeks ago able to do own bathing and dressing. since home from hospital on friday wife has been hanging on to pt to facilitate balance. since november of 2024 wife drives, does finances, medication managment     Extremity/Trunk Assessment   Upper Extremity Assessment Upper Extremity Assessment: Left hand dominant LUE Deficits / Details: decreased grip strength, able to flex L shoulder to ~100 degrees with reports pain, drift observed with flexion. Pt denies impairments in sensation LUE Sensation: WNL LUE Coordination: decreased fine motor    Lower Extremity Assessment Lower Extremity Assessment: Overall WFL for tasks assessed    Cervical / Trunk Assessment Cervical / Trunk Assessment: Normal  Communication   Communication Communication: Impaired Factors Affecting Communication: Difficulty expressing self;Reduced clarity of speech    Cognition Arousal: Alert Behavior During Therapy: WFL for tasks assessed/performed                             Following commands: Intact       Cueing Cueing Techniques: Verbal cues, Tactile cues     General Comments General comments (skin integrity, edema, etc.): VSS on RA    Exercises     Assessment/Plan    PT Assessment Patient needs continued PT services  PT Problem List Decreased strength;Decreased balance;Decreased mobility       PT Treatment Interventions DME instruction;Gait training;Stair training;Functional mobility training;Therapeutic activities;Therapeutic exercise;Balance  training;Patient/family education    PT Goals (Current goals can be found in the Care Plan section)  Acute Rehab PT Goals Patient Stated Goal: to improve functional mobility PT Goal Formulation: With patient/family Time For Goal Achievement: 09/29/23 Potential to Achieve Goals: Good    Frequency Min 2X/week     Co-evaluation   Reason for Co-Treatment: For patient/therapist safety;To address functional/ADL transfers PT goals addressed during session: Mobility/safety with mobility OT goals addressed during session: ADL's and self-care       AM-PAC PT "6 Clicks" Mobility  Outcome Measure Help needed turning from your back to your side while in a flat bed without using bedrails?: None Help needed moving from lying on your back to sitting on the side of a flat bed without using bedrails?: A Little Help needed moving to and from a bed to a chair (including a wheelchair)?: A Little Help needed standing up from a chair using your arms (e.g., wheelchair or bedside chair)?: A Little Help needed to walk in hospital room?: A Little Help needed climbing 3-5 steps with a railing? : A Little 6 Click Score: 19    End of Session Equipment Utilized During Treatment: Gait belt Activity Tolerance: Patient tolerated treatment well Patient left: in bed;with call bell/phone within reach;with nursing/sitter in  room;with family/visitor present Nurse Communication: Mobility status;Precautions PT Visit Diagnosis: Other abnormalities of gait and mobility (R26.89);History of falling (Z91.81)    Time: 7846-9629 PT Time Calculation (min) (ACUTE ONLY): 25 min   Charges:   PT Evaluation $PT Eval Moderate Complexity: 1 Mod   PT General Charges $$ ACUTE PT VISIT: 1 Visit         Shela Nevin, PT, DPT Acute Rehab Services [phone]   Gladys Damme 09/15/2023, 2:12 PM

## 2023-09-15 NOTE — Progress Notes (Signed)
 09/15/23 1300  Spiritual Encounters  Type of Visit Follow up  Care provided to: Pt and family  Referral source Patient request  Reason for visit Advance directives   Chaplain responded to a consult request for Advance Directive education.  Chaplain provided the Advance Directive packet as well as education on Advance Directives-documents an individual completes to communicate their health care directions in advance of a time when they may need them. Chaplain informed pt the documents which may be completed here in the hospital are the Living Will and Health Care Power of [place].  Chaplain informed that the Health Care Power of [place] is a legal document in which an individual names another person, their Health Care Agent, to make health care decisions when the individual is not able to make them for themselves. The Health Care Agent's function can be temporary or permanent depending on the pt's ability to make and communicate those decisions independently. Chaplain informed pt in the absence of a Health Care Power of [place], the state of [place] directs health care providers to look to the following individuals in the order listed: legal guardian; an attorney?in?fact under a general power of attorney (POA) if that POA includes the right to make health care decisions; a husband or wife; a majority of parents and adult children; a majority of adult brothers and sisters; or an individual who has an established relationship with you, who is acting in good faith and who can convey your wishes.  If none of these person are available or willing to make medical decisions on a patient's behalf, the law allows the patient's doctor to make decisions for them as long as another doctor agrees with those decisions.  Chaplain also informed the patient that the Health Care agent has no decision-making authority over any affairs other than those related to his or her medical care.  The chaplain further  educated the pt that a Living Will is a legal document that allows an individual to state his or her desire not to receive life-prolonging measures in the event that they have a condition that is incurable and will result in their death in a short period of time; they are unconscious, and doctors are confident that they will not regain consciousness; and/or they have advanced dementia or other substantial and irreversible loss of mental function. The chaplain informed pt that life-prolonging measures are medical treatments that would only serve to postpone death, including breathing machines, kidney dialysis, antibiotics, artificial nutrition and hydration (tube feeding), and similar forms of treatment and that if an individual is able to express their wishes, they may also make them known without the use of a Living Will, but in the event that an individual is not able to express their wishes themselves, a Living Will allows medical providers and the pt's family and friends ensure that they are not making decisions on the pt's behalf, but rather serving as the pt's voice to convey decisions the pt has already made.  The patient is aware that the decision to create an advance directive is theirs alone and they may chose not to complete the documents or may chose to complete one portion or both.  The patient was informed that they can revoke the documents at any time by striking through them and writing void or by completing new documents, but that it is also advisable that the individual verbally notify interested parties that their wishes have changed.  They are also aware that the document must  be signed in the presence of a notary public and two witnesses and that this can be done while the patient is still admitted to the hospital or after discharge in the community. If they decide to complete Advance Directives after being discharged from the hospital, they have been advised to notify all interested parties  and to provide those documents to their physicians and loved ones in addition to bringing them to the hospital in the event of another hospitalization.  The chaplain informed the pt that if they desire to proceed with completing Advance Directive Documentation while they are still admitted, notary services are typically available at [facility] between the hours of 1:00 and 3:30 Monday-Thursday.    When the patient is ready to have these documents completed, the patient should request that their nurse place a spiritual care consult and indicate that the patient is ready to have their advance directives notarized so that arrangements for witnesses and notary public can be made.  Please page spiritual care if the patient desires further education or has questions.     M.Kubra Delano Metz Resident [phone]

## 2023-09-15 NOTE — Progress Notes (Signed)
 PROGRESS NOTE  Alan Wise ZOX:[id number] DOB: Jul 03, 1970   PCP: Lorre Munroe, NP  Patient is from: Home.  Lives with his wife.  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  Reportedly unable to tolerate various trials of food consistencies per wife.  He had a televisit with neuro-oncology, Dr. Barbaraann Cao who recommended coming to ED for long-term EEG and CVA workup.   In ED, stable vitals.  CMP within normal except for mild hyperglycemia to 139.  CBC with differential without significant finding.  MRI brain ordered.  Neurology consulted.  Started on IV fluid.  MRI brain without contrast showed new punctate focus of restricted diffusion in the anterior insular cortex compatible with a new acute/subacute infarct, stable 8 mm acute/subacute infarct involving the right cerebral peduncle, stable restricted diffusion in the anterior right frontal lobe and along the atrium of the right lateral ventricle consistent with treated tumor, improved diffusion changes within the genu of the corpus callosum suggesting these were ischemic, stable chronic encephalomalacia of the right frontal operculum, and stable periventricular T2 hyperintensities in the left Hemisphere likely reflects the sequela of chronic microvascular ischemia.  Subjective: Seen and examined earlier this morning.  No major events overnight of this morning.  Reports feeling better.  He says he feels stronger.  Did not  get time to eat his breakfast due to frequent interruption.  Objective: Vitals:   09/14/23 2005 09/15/23 0015 09/15/23 0455 09/15/23 0817  BP: (!) 142/84 (!) 142/81 133/73 (!) 149/92  Pulse: 88 67 64 62  Resp: 17 17 18    Temp: 98 F (36.7 C) 98 F (36.7 C) 98 F (36.7 C) 97.7 F (36.5 C)  TempSrc: Oral   Oral  SpO2: 95% 96% 98% 99%  Weight:      Height:        Examination:  GENERAL: No apparent distress.  Nontoxic. HEENT: MMM.  Vision and hearing grossly intact.  NECK: Supple.  No apparent JVD.  RESP:  No IWOB.  Fair aeration bilaterally. CVS:  RRR. Heart sounds normal.  ABD/GI/GU: BS+. Abd soft, NTND.  MSK/EXT:  Moves extremities. No apparent deformity. No edema.  SKIN: no apparent skin lesion or wound NEURO: Awake, alert and oriented appropriately.  Expressive aphasia.  Left homonymous hemianopsia.  Subtle left facial droop.  Motor 5/5 in all muscle groups of UE and LE bilaterally, Normal tone. Light sensation intact in all dermatomes of upper and lower ext bilaterally. Patellar reflex symmetric.  PSYCH: Calm. Normal affect.   Consultants:  Neurology Neuro-oncology  Procedures: EEG  Microbiology summarized: None  Assessment and plan: Acute CVA: Presents with increased gait difficulty, left-sided weakness, confusion and new onset dysphagia.  He has chronic left homonymous hemianopsia attributed to his radiation for his malignancy.  MRI findings as above.  Recent MRA head and neck and TTE without significant finding.  TTE was attempted last hospitalization, but unable due to difficult anatomy (needs peds scope and was unavail at [facility] at the time).  He was planned for eventual TEE and/or need for anticoagulation.  He was  discharged on aspirin and Lipitor.  LDL was 156.   -Follow-up further neurology recommendations. -Continue aspirin and statin -Add Plavix? -Check hemoglobin A1c  Seizure disorder: Overnight EEG suggested cortical dysfunction arising from the right  hemisphere, maximal right temporal region likely secondary to underlying structural abnormality but no seizure or epileptiform discharge.  He is on Lamictal.  Compliant. -Continue home Lamictal -Follow Lamictal level.  Dysphagia: Per wife progressive difficulty ingesting solids or liquids despite various consistencies being trialed.  Overall she states it appears he is having more difficulty swallowing -SLP eval. He may need esophagram.  Oligodendroglioma S/p radiation with radiation induced brain necrosis: Patient's wife reports left homonymous hemianopsia for over 3 months that they attributed to radiation all lower his radiation is over 2 years ago.  No MRI finding to suggest occipital abnormality. -Defer to neurology oncology.  NIDDM-2 with hyperglycemia and hyperlipidemia: A1c 6.3% on 2/26.  Does not seem to be on medications at home Recent Labs  Lab 09/14/23 1626 09/14/23 2045 09/15/23 0016 09/15/23 0455 09/15/23 0819  GLUCAP 88 144* 163* 141* 114*  -Continue SSI-very sensitive -Follow hemoglobin A1c -Continue home Lipitor  Confusion/altered mental status: Seems to have resolved. -Reorientation and delirium precautions  Gait disorder: Could be due to CVA and/or dehydration.  Feels better after IV fluid hydration. -PT/OT eval -Check vitamin B12   Essential hypertension: BP within acceptable range -Continue home amlodipine  Body mass index is 22.53 kg/m.          DVT prophylaxis:  enoxaparin (LOVENOX) injection 40 mg Start: 09/15/23 1000  Code Status: Full code Family Communication: Updated patient's wife at bedside Level of care: Telemetry Medical Status is: Inpatient Remains inpatient appropriate because: Acute CVA, dysphagia   Final disposition: Likely home once medically cleared   55 minutes with more than 50% spent in reviewing records, counseling patient/family and coordinating care.   Sch Meds:  Scheduled Meds:  amLODipine  5 mg Oral Daily    aspirin  81 mg Oral Daily   Or   aspirin  300 mg Rectal Daily   atorvastatin  40 mg Oral Daily   enoxaparin (LOVENOX) injection  40 mg Subcutaneous Q24H   feeding supplement  237 mL Oral BID BM   insulin aspart  0-6 Units Subcutaneous Q4H   lamoTRIgine  150 mg Oral BID   propranolol  20 mg Oral BID   sodium chloride flush  3 mL Intravenous Q12H   Continuous Infusions:  sodium chloride 75 mL/hr at 09/15/23 1007   lacosamide (VIMPAT) IV Stopped (09/15/23 1108)   PRN Meds:.acetaminophen **OR** acetaminophen  Antimicrobials: Anti-infectives (From admission, onward)    None        I have personally reviewed the following labs and images: CBC: Recent Labs  Lab 09/09/23 1240 09/14/23 1039  WBC 7.1 10.2  NEUTROABS  --  7.6  HGB 15.8 15.4  HCT 44.3 44.7  MCV 83.0 85.1  PLT 210 208   BMP &GFR Recent Labs  Lab 09/09/23 1240 09/14/23 1039  NA 138 138  K 4.2 4.2  CL 103 102  CO2 27 30  GLUCOSE 175* 139*  BUN 15 15  CREATININE 0.95 1.14  CALCIUM 9.3 9.2   Estimated Creatinine Clearance: 72.9 mL/min (by C-G formula based on SCr of 1.14 mg/dL). Liver & Pancreas: Recent Labs  Lab 09/14/23 1039  AST 20  ALT 22  ALKPHOS 70  BILITOT 0.8  PROT 6.8  ALBUMIN 4.1   No results for input(s): "LIPASE", "AMYLASE"  in the last 168 hours. No results for input(s): "AMMONIA" in the last 168 hours. Diabetic: No results for input(s): "HGBA1C" in the last 72 hours. Recent Labs  Lab 09/14/23 1626 09/14/23 2045 09/15/23 0016 09/15/23 0455 09/15/23 0819  GLUCAP 88 144* 163* 141* 114*   Cardiac Enzymes: No results for input(s): "CKTOTAL", "CKMB", "CKMBINDEX", "TROPONINI" in the last 168 hours. No results for input(s): "PROBNP" in the last 8760 hours. Coagulation Profile: No results for input(s): "INR", "PROTIME" in the last 168 hours. Thyroid Function Tests: No results for input(s): "TSH", "T4TOTAL", "FREET4", "T3FREE", "THYROIDAB" in the last 72 hours. Lipid Profile: No  results for input(s): "CHOL", "HDL", "LDLCALC", "TRIG", "CHOLHDL", "LDLDIRECT" in the last 72 hours. Anemia Panel: No results for input(s): "VITAMINB12", "FOLATE", "FERRITIN", "TIBC", "IRON", "RETICCTPCT" in the last 72 hours. Urine analysis:    Component Value Date/Time   COLORURINE YELLOW (A) 01/24/2016 2152   APPEARANCEUR Cloudy (A) 02/20/2016 1441   LABSPEC 1.010 01/24/2016 2152   LABSPEC 1.026 05/04/2012 2052   PHURINE 6.0 01/24/2016 2152   GLUCOSEU 3+ (A) 02/20/2016 1441   GLUCOSEU Negative 05/04/2012 2052   HGBUR 3+ (A) 01/24/2016 2152   BILIRUBINUR Negative 02/20/2016 1441   BILIRUBINUR Negative 05/04/2012 2052   KETONESUR NEGATIVE 01/24/2016 2152   PROTEINUR 100 (A) 09/07/2023 1218   NITRITE Negative 02/20/2016 1441   NITRITE NEGATIVE 01/24/2016 2152   LEUKOCYTESUR Negative 02/20/2016 1441   LEUKOCYTESUR Negative 05/04/2012 2052   Sepsis Labs: Invalid input(s): "PROCALCITONIN", "LACTICIDVEN"  Microbiology: No results found for this or any previous visit (from the past 240 hours).  Radiology Studies: Overnight EEG with video Result Date: 09/15/2023 Charlsie Quest, MD     09/15/2023  9:33 AM Patient Name: Alan Wise MRN: [id number] Epilepsy Attending: Charlsie Quest Referring Physician/Provider: Charlsie Quest, MD Duration: 09/14/2023 1503 to 09/15/2023 0930 Patient history: 53yo M with hx of oligodendroglioma followed by Dr. 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 EEG to evaluate for seizure Level of alertness: Awake, asleep AEDs during EEG study: LCM Technical aspects: This EEG study was done with scalp electrodes positioned according to the 10-20 International system of electrode placement. Electrical activity was reviewed with band pass filter of 1-70Hz , sensitivity of 7 uV/mm, display speed of 72mm/sec with a  60Hz  notched filter applied as appropriate. EEG data were recorded continuously and digitally stored.  Video monitoring was available and reviewed as appropriate. Description: The posterior dominant rhythm consists of 8-9 Hz activity of moderate voltage (25-35 uV) seen predominantly in posterior head regions, asymmetric ( right<right )  and reactive to eye opening and eye closing. Sleep was characterized by vertex waves, sleep spindles (12 to 14 Hz), maximal frontocentral region. EEG showed continuous 3 to 6 Hz theta-delta slowing in right hemisphere, maximal right temporal region. Hyperventilation and photic stimulation were not performed.    ABNORMALITY - Continuous slow, right hemisphere, maximal right temporal region  IMPRESSION: This study is suggestive of cortical dysfunction arising from ight hemisphere, maximal right temporal region likely secondary to underlying structural abnormality. No seizures or epileptiform discharges were seen throughout the recording.  Charlsie Quest   MR BRAIN WO CONTRAST Result Date: 09/14/2023 CLINICAL DATA:  Neuro deficit, acute, stroke suspected. Progressive left-sided weakness. Progressive dysphasia. EXAM: MRI HEAD WITHOUT CONTRAST TECHNIQUE: Multiplanar, multiecho pulse sequences of the brain and surrounding structures were obtained without intravenous contrast. COMPARISON:  MR head 09/09/2023. FINDINGS: Brain: The diffusion-weighted images redemonstrate an 8 mm acute/subacute infarct involving the right cerebral peduncle. Previously noted restricted diffusion in the left hippocampus, potentially related to seizure activity has resolved. A new punctate focus of restricted diffusion is present in the anterior insular cortex. Restricted diffusion in the anterior right frontal lobe and along the atrium of the right lateral ventricle is similar the prior study. Diffusion changes within the genu of the corpus callosum have improved. No new hemorrhage is present. No new foci  of restricted diffusion are present. Periventricular T2 hyperintensities in the left hemisphere are stable. Chronic encephalomalacia of the right frontal operculum is stable. White matter changes extend along the right cerebral peduncle into the pons. The brainstem and cerebellum are otherwise within normal limits. Ex vacuo dilation of the right lateral ventricle is noted. No significant extraaxial fluid collection is present. The internal auditory canals are within normal limits. Vascular: Insert normal flow Skull and upper cervical spine: The craniocervical junction is normal. Upper cervical spine is within normal limits. Marrow signal is unremarkable. Sinuses/Orbits: The paranasal sinuses and mastoid air cells are clear. Bilateral lens replacements are noted. Globes and orbits are otherwise unremarkable. Other: IMPRESSION: 1. New punctate focus of restricted diffusion in the anterior insular cortex compatible with a new acute/subacute infarct. 2. 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 Tumor is considered less likely. 3. Stable restricted diffusion in the anterior right frontal lobe and along the atrium of the right lateral ventricle consistent with treated tumor. 4. Improved diffusion changes within the genu of the corpus callosum suggesting these were ischemic. 5. Stable chronic encephalomalacia of the right frontal operculum. 6. Stable periventricular T2 hyperintensities in the left hemisphere. This likely reflects the sequela of chronic microvascular ischemia. Electronically Signed   By: Marin Roberts M.D.   On: 09/14/2023 15:32      Dharma Pare T. Avilene Marrin [facility]  If 7PM-7AM, please contact night-coverage [email or web address] 09/15/2023, 11:27 AM

## 2023-09-15 NOTE — Progress Notes (Signed)
 Went to d/c pt and family had questions. I will hook up one pt and swing back through to d/c EEG if that is still the decision

## 2023-09-15 NOTE — Consult Note (Signed)
 [facility] Neuro-Oncology Consult Note  Patient Care Team: Lorre Munroe, NP as PCP - General (Internal Medicine) Homsher, Wynona Canes, RN as VBCI Care Management  CHIEF COMPLAINTS/PURPOSE OF CONSULTATION:  Astrocytoma Focal Seizures  HISTORY OF PRESENTING ILLNESS:  Alan Wise 53 y.o. male presented with several days of clinical decline.   He has exhibited decline in function since the hospitalization and discharge from [facility].  His balance is worse, left side is weaker.  He is having more difficulty swallowing and speech is more garbled.  He was unable to swallow his pills prior to admission.  Wife attributes beginning of decline to following his >20 minutes convulsion on 09/09/23.  Since starting the EEG, he has had two events of right sided shaking and altered awareness, one at 10:30am and a second event 12:30pm today.  Currently he is back to baseline, resting comfortably.  MEDICAL HISTORY:  Past Medical History:  Diagnosis Date   Allergy    Complication of anesthesia    pt reports he is starting to have more difficulty arousing after surgery   Diabetes mellitus (HCC)    GERD (gastroesophageal reflux disease)    Headache    History of kidney stones    Hyperlipidemia    Renal disorder    kidney stones    SURGICAL HISTORY: Past Surgical History:  Procedure Laterality Date   APPLICATION OF CRANIAL NAVIGATION Right 01/17/2021   Procedure: APPLICATION OF CRANIAL NAVIGATION;  Surgeon: Bedelia Person, MD;  Location: [facility] OR;  Service: Neurosurgery;  Laterality: Right;   COLONOSCOPY  09/03/1994   COLONOSCOPY WITH PROPOFOL N/A 01/07/2023   Procedure: COLONOSCOPY WITH PROPOFOL;  Surgeon: [name], MD;  Location: [facility] ENDOSCOPY;  Service: Endoscopy;  Laterality: N/A;  NOT TOO EARLY   CYSTOSCOPY WITH STENT PLACEMENT Right 01/25/2016   Procedure: CYSTOSCOPY WITH STENT PLACEMENT;  Surgeon: Hildred Laser, MD;  Location: [facility] ORS;  Service: Urology;   Laterality: Right;   FRAMELESS  BIOPSY WITH BRAINLAB Right 01/17/2021   Procedure: RIGHT STEREOTACTIC BIOPSY OF INSULAR LESION;  Surgeon: Bedelia Person, MD;  Location: [facility] OR;  Service: Neurosurgery;  Laterality: Right;   KNEE ARTHROSCOPY W/ MENISCAL REPAIR Right 06/23/1988   POLYPECTOMY  01/07/2023   Procedure: POLYPECTOMY;  Surgeon: [name], MD;  Location: [facility] ENDOSCOPY;  Service: Endoscopy;;   removal of birthmark  06/08/2013   SKIN CANCER EXCISION  06/23/2012   TYMPANOSTOMY TUBE PLACEMENT  08/06/1978   URETEROSCOPY WITH HOLMIUM LASER LITHOTRIPSY Right 01/25/2016   Procedure: URETEROSCOPY WITH HOLMIUM LASER LITHOTRIPSY;  Surgeon: Hildred Laser, MD;  Location: [facility] ORS;  Service: Urology;  Laterality: Right;   URETHRAL STRICTURE DILATATION     visual inspection of vocal cord     1973   WISDOM TOOTH EXTRACTION      SOCIAL HISTORY: Social History   Socioeconomic History   Marital status: Married    Spouse name: Not on file   Number of children: Not on file   Years of education: Not on file   Highest education level: Not on file  Occupational History   Not on file  Tobacco Use   Smoking status: Never   Smokeless tobacco: Never  Vaping Use   Vaping status: Never Used  Substance and Sexual Activity   Alcohol use: Not Currently    Comment: rare   Drug use: No   Sexual activity: Not Currently  Other Topics Concern   Not on file  Social History Narrative   Not  on file   Social Drivers of Health   Financial Resource Strain: Low Risk  (12/05/2022)   Received from [facility], [facility]   Overall Financial Resource Strain (CARDIA)    Difficulty of Paying Living Expenses: Not hard at all  Food Insecurity: No Food Insecurity (09/14/2023)   Hunger Vital Sign    Worried About Running Out of Food in the Last Year: Never true    Ran Out of Food in the Last Year: Never true  Transportation Needs: No Transportation Needs (09/14/2023)    PRAPARE - Administrator, Civil Service (Medical): No    Lack of Transportation (Non-Medical): No  Physical Activity: Not on file  Stress: Not on file  Social Connections: Moderately Isolated (09/14/2023)   Social Connection and Isolation Panel [NHANES]    Frequency of Communication with Friends and Family: More than three times a week    Frequency of Social Gatherings with Friends and Family: More than three times a week    Attends Religious Services: Never    Database administrator or Organizations: No    Attends Banker Meetings: Never    Marital Status: Married  Catering manager Violence: Not At Risk (09/14/2023)   Humiliation, Afraid, Rape, and Kick questionnaire    Fear of Current or Ex-Partner: No    Emotionally Abused: No    Physically Abused: No    Sexually Abused: No    FAMILY HISTORY: Family History  Problem Relation Age of Onset   Hyperlipidemia Father    Hypertension Father    Diabetes Father    Benign prostatic hyperplasia Father    Stroke Maternal Grandmother    Diabetes Maternal Grandmother    Stroke Paternal Grandmother    Hypertension Paternal Grandmother    Kidney disease Neg Hx    Prostate cancer Neg Hx     ALLERGIES:  is allergic to contrast media [iodinated contrast media].  MEDICATIONS:  Current Facility-Administered Medications  Medication Dose Route Frequency Provider Last Rate Last Admin   acetaminophen (TYLENOL) tablet 650 mg  650 mg Oral Q6H PRN Lewie Chamber, MD       Or   acetaminophen (TYLENOL) suppository 650 mg  650 mg Rectal Q6H PRN Lewie Chamber, MD       amLODipine (NORVASC) tablet 5 mg  5 mg Oral Daily Candelaria Stagers T, MD   5 mg at 09/15/23 1311   aspirin chewable tablet 81 mg  81 mg Oral Daily Charlsie Quest, MD   81 mg at 09/15/23 1009   Or   aspirin suppository 300 mg  300 mg Rectal Daily Charlsie Quest, MD       atorvastatin (LIPITOR) tablet 40 mg  40 mg Oral Daily Candelaria Stagers T, MD   40 mg at  09/15/23 1311   enoxaparin (LOVENOX) injection 40 mg  40 mg Subcutaneous Q24H Lewie Chamber, MD   40 mg at 09/15/23 1009   feeding supplement (ENSURE ENLIVE / ENSURE PLUS) liquid 237 mL  237 mL Oral BID BM Lewie Chamber, MD   237 mL at 09/15/23 1625   insulin aspart (novoLOG) injection 0-5 Units  0-5 Units Subcutaneous QHS Gonfa, Taye T, MD       insulin aspart (novoLOG) injection 0-9 Units  0-9 Units Subcutaneous TID WC Candelaria Stagers T, MD   1 Units at 09/15/23 1626   lamoTRIgine (LAMICTAL) tablet 150 mg  150 mg Oral BID Charlsie Quest, MD  Or   lacosamide (VIMPAT) 150 mg in sodium chloride 0.9 % 25 mL IVPB  150 mg Intravenous BID Charlsie Quest, MD   Stopped at 09/15/23 1108   propranolol (INDERAL) tablet 20 mg  20 mg Oral BID Candelaria Stagers T, MD   20 mg at 09/15/23 1311   sodium chloride flush (NS) 0.9 % injection 3 mL  3 mL Intravenous Q12H Lewie Chamber, MD   3 mL at 09/15/23 1010    REVIEW OF SYSTEMS:   Eyes: Denies blurriness of vision Ears, nose, mouth, throat, and face: Denies mucositis or sore throat Respiratory: Denies cough, dyspnea or wheezes Cardiovascular: Denies palpitation, chest discomfort or lower extremity swelling Gastrointestinal:  Denies nausea, constipation, diarrhea GU: Denies dysuria or incontinence Skin: Denies abnormal skin rashes Neurological: Per HPI Musculoskeletal: Denies joint pain, back or neck discomfort. No decrease in ROM Behavioral/Psych: Denies anxiety, disturbance in thought content, and mood instability   PHYSICAL EXAMINATION: Vitals:   09/15/23 1228 09/15/23 1530  BP: (!) 178/88 (!) 153/86  Pulse: 71 66  Resp:  18  Temp: 97.6 F (36.4 C) 98.3 F (36.8 C)  SpO2: 98% 98%   KPS: 70. General: EEG leads in place Head: Normal EENT: No conjunctival injection or scleral icterus. Oral mucosa moist Lungs: Resp effort normal Cardiac: Regular rate and rhythm Abdomen: Soft, non-distended abdomen Skin: No rashes cyanosis or  petechiae. Extremities: No clubbing or edema  NEUROLOGIC EXAM: Mental Status: Awake, alert, attentive to examiner. Oriented to self and environment.  Language is notable for impaired fluency, with additional impairments in comprehension.  Modest psychomotor slowing, impaired 3 object recall Cranial Nerves: Visual acuity is grossly normal. Right field hemianopia. Extra-ocular movements intact. No ptosis. Mild UMN right facial paresis. Motor: Tone and bulk are normal. Power is full in both arms and legs. Reflexes are symmetric, no pathologic reflexes present.  Sensory: Intact to light touch   LABORATORY DATA:  I have reviewed the data as listed Lab Results  Component Value Date   WBC 10.2 09/14/2023   HGB 15.4 09/14/2023   HCT 44.7 09/14/2023   MCV 85.1 09/14/2023   PLT 208 09/14/2023   Recent Labs    05/15/23 0615 05/22/23 0936 08/19/23 0818 09/09/23 1240 09/14/23 1039  NA 138 137 137 138 138  K 4.1 4.2 4.3 4.2 4.2  CL 104 101 96* 103 102  CO2 28 30 33* 27 30  GLUCOSE 109* 161* 184* 175* 139*  BUN 12 14 14 15 15   CREATININE 1.25* 0.88 0.95 0.95 1.14  CALCIUM 8.9 9.3 9.8 9.3 9.2  GFRNONAA >60 >60  --  >60 >60  PROT 6.6 6.7 6.9  --  6.8  ALBUMIN 3.7 4.3  --   --  4.1  AST 14* 15 21  --  20  ALT 18 16 33  --  22  ALKPHOS 84 95  --   --  70  BILITOT 0.6 0.5 0.5  --  0.8    RADIOGRAPHIC STUDIES: I have personally reviewed the radiological images as listed and agreed with the findings in the report. 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 EXAM: MRI HEAD WITH CONTRAST TECHNIQUE: Multiplanar, multiecho pulse sequences of the brain and surrounding structures were obtained with intravenous contrast. CONTRAST:  7mL GADAVIST GADOBUTROL 1 MMOL/ML IV SOLN COMPARISON:  Noncontrast brain MRI yesterday and earlier  FINDINGS:  Heterogeneous enhancement of the chronic infiltrative right frontal lobe predominant tumor is stable compared to 09/07/2023. There remains regional ex vacuo enlargement of the right lateral ventricle. The punctate site of right insula enhancement on 09/07/2023 is now enhancing on series 6, image 26. And similar enhancement of the punctate left mesial temporal lobe diffusion restricted lesion seen at that time. Both of those areas have faded on DWI yesterday. However, ongoing and intense abnormal diffusion at the right cerebral peduncle (yesterday), not significantly changed over this series of exams, and with questionable petechial enhancement there now on this exam series 6, image 20. No other new areas of enhancement are identified. The major dural venous sinuses are enhancing and appear to be patent. IMPRESSION: Constellation of findings over this series of multiple MRIs without and with contrast since 09/07/2023 suspicious for: - infiltrative tumor progression into the right cerebral peduncle. - superimposed scattered acute and subacute lacunar infarcts, with several from 09/07/2023 now faded on diffusion and with discrete post-ischemic appearing enhancement. Recommend continued MRI surveillance without and with contrast ( 4-6 weeks suggested time frame) to further evaluate the evolution of the above. Electronically Signed   By: Odessa Fleming M.D.   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 EEG to  evaluate for seizure Level of alertness: Awake, asleep AEDs during EEG study: LCM Technical aspects: This EEG study was done with scalp electrodes positioned according to the 10-20 International system of electrode placement. Electrical activity was reviewed with band pass filter of 1-70Hz , sensitivity of 7 uV/mm, display speed of 73mm/sec with a 60Hz  notched filter applied as appropriate. EEG data were recorded continuously and digitally stored.  Video monitoring was available and reviewed as appropriate. Description: The posterior dominant rhythm consists of 8-9 Hz activity of moderate voltage (25-35 uV) seen predominantly in posterior head regions, asymmetric ( right<right )  and reactive to eye opening and eye closing. Sleep was characterized by vertex waves, sleep spindles (12 to 14 Hz), maximal frontocentral region. EEG showed continuous 3 to 6 Hz theta-delta slowing in right hemisphere, maximal right temporal region. Hyperventilation and photic stimulation were not performed.    ABNORMALITY - Continuous slow, right hemisphere, maximal right temporal region  IMPRESSION: This study is suggestive of cortical dysfunction arising from ight hemisphere, maximal right temporal region likely secondary to underlying structural abnormality. No seizures or epileptiform discharges were seen throughout the recording.  Charlsie Quest   MR BRAIN WO CONTRAST Result Date: 09/14/2023 CLINICAL DATA:  Neuro deficit, acute, stroke suspected. Progressive left-sided weakness. Progressive dysphasia. EXAM: MRI HEAD WITHOUT CONTRAST TECHNIQUE: Multiplanar, multiecho pulse sequences of the brain and surrounding structures were obtained without intravenous contrast. COMPARISON:  MR head 09/09/2023. FINDINGS: Brain: The diffusion-weighted images redemonstrate an 8 mm acute/subacute infarct involving the right cerebral peduncle. Previously noted restricted diffusion in the left hippocampus, potentially related to seizure activity has  resolved. A new punctate focus of restricted diffusion is present in the anterior insular cortex. Restricted diffusion in the anterior right frontal lobe and along the atrium of the right  lateral ventricle is similar the prior study. Diffusion changes within the genu of the corpus callosum have improved. No new hemorrhage is present. No new foci of restricted diffusion are present. Periventricular T2 hyperintensities in the left hemisphere are stable. Chronic encephalomalacia of the right frontal operculum is stable. White matter changes extend along the right cerebral peduncle into the pons. The brainstem and cerebellum are otherwise within normal limits. Ex vacuo dilation of the right lateral ventricle is noted. No significant extraaxial fluid collection is present. The internal auditory canals are within normal limits. Vascular: Insert normal flow Skull and upper cervical spine: The craniocervical junction is normal. Upper cervical spine is within normal limits. Marrow signal is unremarkable. Sinuses/Orbits: The paranasal sinuses and mastoid air cells are clear. Bilateral lens replacements are noted. Globes and orbits are otherwise unremarkable. Other: IMPRESSION: 1. New punctate focus of restricted diffusion in the anterior insular cortex compatible with a new acute/subacute infarct. 2. 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 Tumor is considered less likely. 3. Stable restricted diffusion in the anterior right frontal lobe and along the atrium of the right lateral ventricle consistent with treated tumor. 4. Improved diffusion changes within the genu of the corpus callosum suggesting these were ischemic. 5. Stable chronic encephalomalacia of the right frontal operculum. 6. Stable periventricular T2 hyperintensities in the left hemisphere. This likely reflects the sequela of chronic microvascular ischemia. Electronically Signed   By: Marin Roberts M.D.   On: 09/14/2023  15:32   ECHOCARDIOGRAM COMPLETE Result Date: 09/10/2023    ECHOCARDIOGRAM REPORT   Patient Name:   MIRKO TAILOR Date of Exam: 09/10/2023 Medical Rec #:  [id number]         Height:       68.0 in Accession #:    [id number]        Weight:       148.1 lb Date of Birth:  [birth year]        BSA:          1.799 m Patient Age:    52 years          BP:           138/81 mmHg Patient Gender: M                 HR:           55 bpm. Exam Location:  [facility] Procedure: 2D Echo, Cardiac Doppler, Color Doppler and Saline Contrast Bubble            Study (Both Spectral and Color Flow Doppler were utilized during            procedure). Indications:     Stroke I63.9  History:         Patient has prior history of Echocardiogram examinations, most                  recent 01/15/2022. Risk Factors:Diabetes and Dyslipidemia.  Sonographer:     Cristela Blue Referring Phys:  [id number] AMY N COX Diagnosing Phys: Julien Nordmann MD  Sonographer Comments: No parasternal window and no subcostal window. IMPRESSIONS  1. Left ventricular ejection fraction, by estimation, is 55 to 60%. The left ventricle has normal function. The left ventricle has no regional wall motion abnormalities. Left ventricular diastolic parameters are consistent with Grade I diastolic dysfunction (impaired relaxation).  2. Right ventricular systolic function is normal. The right ventricular size is normal. There is normal pulmonary artery systolic pressure. The  estimated right ventricular systolic pressure is 25.8 mmHg.  3. The mitral valve is normal in structure. No evidence of mitral valve regurgitation. No evidence of mitral stenosis.  4. The aortic valve is normal in structure. Aortic valve regurgitation is not visualized. No aortic stenosis is present.  5. The inferior vena cava is normal in size with greater than 50% respiratory variability, suggesting right atrial pressure of 3 mmHg.  6. Agitated saline contrast bubble study was negative, with no evidence of any  interatrial shunt. FINDINGS  Left Ventricle: Left ventricular ejection fraction, by estimation, is 55 to 60%. The left ventricle has normal function. The left ventricle has no regional wall motion abnormalities. Strain was performed and the global longitudinal strain is indeterminate. The left ventricular internal cavity size was normal in size. There is no left ventricular hypertrophy. Left ventricular diastolic parameters are consistent with Grade I diastolic dysfunction (impaired relaxation). Right Ventricle: The right ventricular size is normal. No increase in right ventricular wall thickness. Right ventricular systolic function is normal. There is normal pulmonary artery systolic pressure. The tricuspid regurgitant velocity is 2.28 m/s, and  with an assumed right atrial pressure of 5 mmHg, the estimated right ventricular systolic pressure is 25.8 mmHg. Left Atrium: Left atrial size was normal in size. Right Atrium: Right atrial size was normal in size. Pericardium: There is no evidence of pericardial effusion. Mitral Valve: The mitral valve is normal in structure. No evidence of mitral valve regurgitation. No evidence of mitral valve stenosis. MV peak gradient, 2.2 mmHg. The mean mitral valve gradient is 1.0 mmHg. Tricuspid Valve: The tricuspid valve is normal in structure. Tricuspid valve regurgitation is not demonstrated. No evidence of tricuspid stenosis. Aortic Valve: The aortic valve is normal in structure. Aortic valve regurgitation is not visualized. No aortic stenosis is present. Aortic valve mean gradient measures 2.0 mmHg. Aortic valve peak gradient measures 4.0 mmHg. Aortic valve area, by VTI measures 3.01 cm. Pulmonic Valve: The pulmonic valve was normal in structure. Pulmonic valve regurgitation is not visualized. No evidence of pulmonic stenosis. Aorta: The aortic root is normal in size and structure. Venous: The inferior vena cava is normal in size with greater than 50% respiratory variability,  suggesting right atrial pressure of 3 mmHg. IAS/Shunts: No atrial level shunt detected by color flow Doppler. Agitated saline contrast was given intravenously to evaluate for intracardiac shunting. Agitated saline contrast bubble study was negative, with no evidence of any interatrial shunt. There  is no evidence of a patent foramen ovale. There is no evidence of an atrial septal defect. Additional Comments: 3D was performed not requiring image post processing on an independent workstation and was indeterminate.  LEFT VENTRICLE PLAX 2D LVIDd:         4.10 cm   Diastology LVIDs:         2.60 cm   LV e' medial:    5.33 cm/s LV PW:         1.20 cm   LV E/e' medial:  11.8 LV IVS:        1.10 cm   LV e' lateral:   6.85 cm/s LVOT diam:     2.00 cm   LV E/e' lateral: 9.2 LV SV:         52 LV SV Index:   29 LVOT Area:     3.14 cm  RIGHT VENTRICLE RV Basal diam:  2.10 cm RV Mid diam:    2.00 cm RV S prime:     11.60  cm/s TAPSE (M-mode): 1.6 cm LEFT ATRIUM           Index        RIGHT ATRIUM          Index LA diam:      3.30 cm 1.83 cm/m   RA Area:     7.43 cm LA Vol (A2C): 21.5 ml 11.95 ml/m  RA Volume:   11.30 ml 6.28 ml/m LA Vol (A4C): 13.1 ml 7.28 ml/m  AORTIC VALVE AV Area (Vmax):    2.78 cm AV Area (Vmean):   2.94 cm AV Area (VTI):     3.01 cm AV Vmax:           100.00 cm/s AV Vmean:          59.800 cm/s AV VTI:            0.172 m AV Peak Grad:      4.0 mmHg AV Mean Grad:      2.0 mmHg LVOT Vmax:         88.60 cm/s LVOT Vmean:        56.000 cm/s LVOT VTI:          0.165 m LVOT/AV VTI ratio: 0.96  AORTA Ao Root diam: 2.60 cm MITRAL VALVE               TRICUSPID VALVE MV Area (PHT): 3.58 cm    TR Peak grad:   20.8 mmHg MV Area VTI:   2.50 cm    TR Vmax:        228.00 cm/s MV Peak grad:  2.2 mmHg MV Mean grad:  1.0 mmHg    SHUNTS MV Vmax:       0.75 m/s    Systemic VTI:  0.16 m MV Vmean:      49.3 cm/s   Systemic Diam: 2.00 cm MV Decel Time: 212 msec MV E velocity: 62.90 cm/s MV A velocity: 75.50 cm/s MV E/A  ratio:  0.83 Julien Nordmann MD Electronically signed by Julien Nordmann MD Signature Date/Time: 09/10/2023/11:37:44 AM    Final    MR BRAIN WO CONTRAST Result Date: 09/10/2023 CLINICAL DATA:  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 EXAM: MRI HEAD WITHOUT CONTRAST TECHNIQUE: Multiplanar, multiecho pulse sequences of the brain and surrounding structures were obtained without intravenous contrast. COMPARISON:  Comparison made with CT from earlier the same day as well as multiple previous studies, including recent brain MRI from 09/07/2023 FINDINGS: Brain: Cerebral volume loss with mild chronic microvascular ischemic disease, stable. Few scattered superimposed remote lacunar infarcts noted about the pons. Previously noted areas of diffusion signal abnormality involving the anterior genu of the corpus callosum, right insula, and mesial left temporal lobe are slightly decreased in prominence from prior, likely reflecting small evolving subacute infarcts. No associated hemorrhage or mass effect. Additional 7 mm focus of diffusion signal abnormality at the right cerebral peduncle is relatively stable, and remains indeterminate, possibly reflecting ischemic change and/or tumor involvement. No other new or interval areas of infarction. Multifocal signal abnormality involving the right frontal and temporal lobes, consistent with known brain tumor. Few scattered foci of associated cystic encephalomalacia with associated T2/FLAIR signal. Associated diffusion signal abnormality is relatively stable. Scattered areas of intrinsic precontrast T1 hyperintensity and susceptibility artifact are also not appreciably changed, likely reflecting post treatment changes and/or chronic blood products. No evidence for new intracranial hemorrhage by MRI. Previously noted hyperdensity on prior CT corresponds with these signal changes. Although these changes on  CT from earlier today are new as compared to most recent  CT from 05/13/2023, these changes not appreciably changed from intervening brain MRIs, with no evidence for new or interval hemorrhage. No other mass lesion or mass effect. Stable ventricular size and morphology without hydrocephalus. No extra-axial fluid collection. Pituitary gland within normal limits. No other intrinsic temporal lobe abnormality. Vascular: Major intracranial vascular flow voids are maintained. Skull and upper cervical spine: Craniocervical junction within normal limits. Bone marrow signal intensity normal. No scalp soft tissue abnormality. Sinuses/Orbits: Globes and orbital soft tissues within normal limits. Mild mucosal thickening present about the ethmoidal air cells and maxillary sinuses, slightly worse on the left. No significant mastoid effusion. Other: None. IMPRESSION: 1. No acute intracranial abnormality. 2. Previously described subcentimeter areas of diffusion signal abnormality (seen on recent brain MRI from 09/07/2023) involving the anterior genu of the corpus callosum, right insula, and mesial left temporal lobe are slightly decreased in prominence from prior, likely reflecting evolving subacute infarcts. No associated hemorrhage or mass effect. No areas of new or interval infarction. 3. Additional 7 mm focus of diffusion signal abnormality at the right cerebral peduncle is relatively stable, and remains indeterminate, possibly reflecting ischemic change and/or tumor involvement. Attention at follow-up recommended. 4. Multifocal signal abnormality involving the right frontal and temporal lobes, consistent with known brain tumor. Overall appearance is not significantly changed, with no evidence for new or interval hemorrhage by MRI. 5. Underlying atrophy with chronic microvascular ischemic disease, with a few scattered remote lacunar infarcts about the pons. Electronically Signed   By: Rise Mu M.D.   On: 09/10/2023 00:51   CT HEAD WO CONTRAST ( ) Result Date:  09/09/2023 CLINICAL DATA:  History of oligodendroglioma, presenting with confusion and chest tightness. EXAM: CT HEAD WITHOUT CONTRAST TECHNIQUE: Contiguous axial images were obtained from the base of the skull through the vertex without intravenous contrast. RADIATION DOSE REDUCTION: This exam was performed according to the departmental dose-optimization program which includes automated exposure control, adjustment of the mA and/or kV according to patient size and/or use of iterative reconstruction technique. COMPARISON:  May 13, 2023 FINDINGS: Brain: There is generalized cerebral atrophy with widening of the extra-axial spaces and ventricular dilatation. There are areas of decreased attenuation within the white matter tracts of the supratentorial brain, consistent with microvascular disease changes. Post treatment changes are again seen within the right frontal lobe. A mild-to-moderate amount of curvilinear increased attenuation (approximately 57.92 Hounsfield units) is also seen within this region, along the junction of the cortex and white matter. Mild involvement of the right parietal lobe is noted. There is no evidence of associated mass effect or midline shift. Vascular: Marked severity bilateral cavernous carotid artery calcification is noted. Skull: Normal. Negative for fracture or focal lesion. Sinuses/Orbits: There is marked severity left ethmoid sinus mucosal thickening. Other: None. IMPRESSION: 1. Post treatment changes within the right frontal lobe with a mild-to-moderate amount of curvilinear increased attenuation within this region, as described above. Given the patient's history of oligodendroglioma, this may represent areas of post treatment mineralization and calcification. A small amount of acute hemorrhage cannot completely be excluded. MRI correlation is recommended. 2. Generalized cerebral atrophy and microvascular disease changes of the supratentorial brain. 3. Marked severity left  ethmoid sinus disease. Electronically Signed   By: Aram Candela M.D.   On: 09/09/2023 21:11   MR ANGIO NECK W WO CONTRAST Result Date: 09/09/2023 CLINICAL DATA:  Follow-up examination for stroke. EXAM: MRA NECK WITHOUT AND WITH CONTRAST MRA  HEAD WITHOUT CONTRAST TECHNIQUE: Multiplanar and multiecho pulse sequences of the neck were obtained without and with intravenous contrast. Angiographic images of the neck were obtained using MRA technique without and with intravenous contrast; Angiographic images of the Circle of Willis were obtained using MRA technique without intravenous contrast. CONTRAST:  6mL GADAVIST GADOBUTROL 1 MMOL/ML IV SOLN COMPARISON:  Comparison made with brain MRI from 09/07/2023. FINDINGS: MRA NECK FINDINGS Aortic arch: Visualized aortic arch within normal limits for caliber with standard 3 vessel morphology. No stenosis about the origin of the great vessels. Right carotid system: Right common and internal carotid arteries are patent with antegrade flow. No dissection or hemodynamically significant stenosis about the right carotid artery system. Left carotid system: Left common and internal carotid arteries are patent with antegrade flow. No dissection or hemodynamically significant stenosis about the left carotid artery system. Vertebral arteries: Both vertebral arteries arise from subclavian arteries. Vertebral arteries are patent with antegrade flow. No dissection or hemodynamically significant stenosis. Other: None. MRA HEAD FINDINGS Anterior circulation: Both internal carotid arteries are patent to the termini without stenosis or other abnormality. Right A1 segment patent. Left A1 hypoplastic and/or absent. Normal anterior communicating artery complex. Anterior cerebral arteries patent without stenosis. No M1 stenosis or occlusion. Distal MCA branches perfused and symmetric. Posterior circulation: Visualized V4 segments patent without stenosis. Right PICA patent. Left PICA not seen.  Basilar patent without stenosis. Superior cerebral arteries patent bilaterally. Fetal type origin of the PCAs bilaterally. Both PCAs patent to their distal aspects without significant stenosis. Anatomic variants: As above. Other: No intracranial aneurysm. IMPRESSION: Normal MRA of the head and neck. No large vessel occlusion, hemodynamically significant stenosis, or other acute vascular abnormality. Electronically Signed   By: Rise Mu M.D.   On: 09/09/2023 20:17   MR ANGIO HEAD WO CONTRAST Result Date: 09/09/2023 CLINICAL DATA:  Follow-up examination for stroke. EXAM: MRA NECK WITHOUT AND WITH CONTRAST MRA HEAD WITHOUT CONTRAST TECHNIQUE: Multiplanar and multiecho pulse sequences of the neck were obtained without and with intravenous contrast. Angiographic images of the neck were obtained using MRA technique without and with intravenous contrast; Angiographic images of the Circle of Willis were obtained using MRA technique without intravenous contrast. CONTRAST:  6mL GADAVIST GADOBUTROL 1 MMOL/ML IV SOLN COMPARISON:  Comparison made with brain MRI from 09/07/2023. FINDINGS: MRA NECK FINDINGS Aortic arch: Visualized aortic arch within normal limits for caliber with standard 3 vessel morphology. No stenosis about the origin of the great vessels. Right carotid system: Right common and internal carotid arteries are patent with antegrade flow. No dissection or hemodynamically significant stenosis about the right carotid artery system. Left carotid system: Left common and internal carotid arteries are patent with antegrade flow. No dissection or hemodynamically significant stenosis about the left carotid artery system. Vertebral arteries: Both vertebral arteries arise from subclavian arteries. Vertebral arteries are patent with antegrade flow. No dissection or hemodynamically significant stenosis. Other: None. MRA HEAD FINDINGS Anterior circulation: Both internal carotid arteries are patent to the termini  without stenosis or other abnormality. Right A1 segment patent. Left A1 hypoplastic and/or absent. Normal anterior communicating artery complex. Anterior cerebral arteries patent without stenosis. No M1 stenosis or occlusion. Distal MCA branches perfused and symmetric. Posterior circulation: Visualized V4 segments patent without stenosis. Right PICA patent. Left PICA not seen. Basilar patent without stenosis. Superior cerebral arteries patent bilaterally. Fetal type origin of the PCAs bilaterally. Both PCAs patent to their distal aspects without significant stenosis. Anatomic variants: As above. Other: No intracranial  aneurysm. IMPRESSION: Normal MRA of the head and neck. No large vessel occlusion, hemodynamically significant stenosis, or other acute vascular abnormality. Electronically Signed   By: Rise Mu M.D.   On: 09/09/2023 20:17   DG Chest Port 1 View Result Date: 09/09/2023 CLINICAL DATA:  [id number] Chest pain [id number] EXAM: PORTABLE CHEST 1 VIEW COMPARISON:  04/11/2023 FINDINGS: Bilateral lung fields are clear. Bilateral costophrenic angles are clear. Normal cardio-mediastinal silhouette. No acute osseous abnormalities. The soft tissues are within normal limits. IMPRESSION: No active disease. Electronically Signed   By: Jules Schick M.D.   On: 09/09/2023 15:09   MR BRAIN W WO CONTRAST Result Date: 09/07/2023 CLINICAL DATA:  Provided history: Oligodendroglioma. Brain/CNS neoplasm, assess treatment response. Additional history provided by the scanning technologist: Multiple falls, vision loss in left eye, aphasia. Avastin therapy. Restaging. EXAM: MRI HEAD WITHOUT AND WITH CONTRAST TECHNIQUE: Multiplanar, multiecho pulse sequences of the brain and surrounding structures were obtained without and with intravenous contrast. CONTRAST:  6mL GADAVIST GADOBUTROL 1 MMOL/ML IV SOLN COMPARISON:  Brain MRI 07/06/2023. FINDINGS: Brain: Mild generalized cerebral atrophy. 9 mm focus of restricted diffusion  with associated T2 FLAIR hyperintense signal abnormality in the right cerebral peduncle (series 5, image 73). 5 mm focus of restricted diffusion with associated T2 FLAIR hyperintense signal abnormality in the anterior left temporal lobe (series 5, image 74). 3 mm focus of restricted diffusion within the callosal genu at midline (series 5, image 81). 5 mm focus of restricted diffusion within the right insula with associated T2 FLAIR hyperintense signal abnormality. These foci are new as compared to the brain MRI of 07/06/2023 and may reflect acute/early subacute infarcts. However, new sites of tumor cannot be excluded. There is a new 3 mm focus of enhancement in the right subinsular region (series 16, image 90). No other new or progressive foci of enhancement are identified. An irregular focus of restricted diffusion within the right retrolenticular/periatrial region has mildly increased in extent since the prior MRI. A region of restricted diffusion within the mid and anterior right frontal lobe (along the margins of the right lateral ventricle) has also slightly increased in extent (for instance, extending more medially within the body of the corpus callosum on series 10, image 25). T2 FLAIR hyperintense signal abnormality within the right cerebral hemisphere has not significantly changed, nor has T2 FLAIR hyperintense signal abnormality within the body/genu of corpus callosum and left frontal lobe periventricular white matter. Unchanged parenchymal swelling at the right insula. Superimposed foci of chronic blood products within the right frontal lobe have increased in extent. Redemonstrated focus of cystic encephalomalacia at the anterior right insula. Chronic lacunar infarcts again demonstrated within the left aspect of the pons. Mild multifocal T2 FLAIR hyperintense signal abnormality elsewhere within the cerebral white matter and pons, nonspecific but compatible chronic small vessel ischemic disease. No  extra-axial fluid collection. No midline shift. Vascular: Maintained flow voids within the proximal large arterial vessels. Skull and upper cervical spine: No focal worrisome marrow lesion. Right frontoparietal burr hole. Sinuses/Orbits: No focal worrisome marrow lesion. Incompletely assessed cervical spondylosis. Other: No mass or acute finding within the imaged orbits. Mild mucosal thickening within the left ethmoid and left maxillary sinuses. MRi brain impression #1 called by telephone at the time of interpretation on 09/07/2023 at 7:28 pm to provider [facility] Kataleena Holsapple , who verbally acknowledged these results. IMPRESSION: 1. Foci of restricted diffusion and T2 FLAIR hyperintense signal abnormality within the right cerebral peduncle, anterior left temporal lobe, callosal genu and right  insula, as described and new from the prior brain MRI of 07/06/2023. These foci may reflect acute/early subacute infarcts. However, new sites of tumor cannot be excluded. A short-interval follow-up brain MRI is recommended. 2. Foci of restricted diffusion within the right retrolenticular/periatrial region and right frontal lobe have slightly increased in extent since the prior MRI. Additionally, there is a new 3 mm right subinsular focus of parenchymal enhancement. These findings may reflect evolving treatment changes. However, a short-interval follow-up brain MRI is recommended for close surveillance. 3. Pre-contrast T1 hyperintense chronic blood products within the right frontal lobe have increased in extent. 4. Otherwise no significant interval change. 5. 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 Electronically Signed   By: Jackey Loge D.O.   On: 09/07/2023 20:07    ASSESSMENT & PLAN:  Crytopgenic Stroke  CLAYDEN WITHEM presents with clinical syndrome of focal and generalized decline in function.  Etiology is likely multifactorial; radiation necrosis,  delayed radiation cytotoxicity, breakthrough seizures, multiple infarcts on recent MRI studies.    Most recent MRI demonstrates tiny infarct which is likely not clinically symptomatic.  Anticoagulation with Eliquis is reasonable given normal stroke workup, concurrent cancer diagnosis with avastin and ongoing DWI signal abnormality despite anti-platelet.  Long term EEG will help ascertain etiology of push button events from today.  Agreeable with addition of Vimpat 150mg  BID for now, in addition to Lamictal 150mg  BID home dose.  Once leads are removed, I think he would likely benefit from short rehab course, if able.   All questions were answered. The patient knows to call the clinic with any problems, questions or concerns.  The total time spent in the encounter was 60 minutes and more than 50% was on counseling and review of test results     Henreitta Leber, MD 09/15/2023 5:06 PM

## 2023-09-15 NOTE — Progress Notes (Addendum)
 STROKE TEAM PROGRESS NOTE   SIGNIFICANT HOSPITAL EVENTS - Presented 3/24 with worsening mentation, increased confusion, dizziness, weakness, and trouble swallowing - MRI brain 3/24: Punctate focus of restricted diffusion in the anterior insular cortex compatible with a new acute/subacute infarct.  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  Tumor is considered less likely.  Stable restricted diffusion in the anterior right frontal lobe and along the atrium of the right lateral ventricle consistent with treated tumor.  Improved diffusion changes within the genu of the corpus callosum suggesting these were ischemic.  Stable chronic encephalomalacia of the right frontal operculum.  INTERIM HISTORY/SUBJECTIVE  On LTM--read, no seizures seen.  Patient's wife stated that he had 2 events today which were captured on the long-term EEG and we await interpretation of electro graphic correlation to decide if there was seizures or not.  Will continue LTM overnight tonight.  Dr. Melynda Ripple will review in the a.m. and speak to family further as they requested.  2 events of right sided shaking and altered awareness was noted by family.  These 2 specific time periods were checked by epileptologist and there were no concurrent seizures seen. Continues on Vimpat 150mg  BID as he is unable to take lamotrigine  MRI with and without contrast suspicious for infiltrative tumor progression into right cerebral peduncle, superimposed scattered acute and subacute lacunar infarcts.  Mother and father at bedside. Thorough discussion concerning assessment and plan of care.  Patient is awake, confused, follows commands intermittently, easily frustrated, with outbursts and labile mood.   OBJECTIVE CBC    Component Value Date/Time   WBC 10.2 09/14/2023 1039   RBC 5.25 09/14/2023 1039   HGB 15.4 09/14/2023 1039   HGB 15.4 09/07/2023 1218   HGB 16.4 05/04/2012 1815   HCT 44.7 09/14/2023 1039   HCT  48.4 05/04/2012 1815   PLT 208 09/14/2023 1039   PLT 195 09/07/2023 1218   PLT 261 05/04/2012 1815   MCV 85.1 09/14/2023 1039   MCV 86 05/04/2012 1815   MCH 29.3 09/14/2023 1039   MCHC 34.5 09/14/2023 1039   RDW 12.6 09/14/2023 1039   RDW 12.7 05/04/2012 1815   LYMPHSABS 1.1 09/14/2023 1039   MONOABS 0.4 09/14/2023 1039   EOSABS 1.0 (H) 09/14/2023 1039   BASOSABS 0.1 09/14/2023 1039   BMET    Component Value Date/Time   NA 138 09/14/2023 1039   NA 138 05/04/2012 1815   K 4.2 09/14/2023 1039   K 3.5 05/04/2012 1815   CL 102 09/14/2023 1039   CL 99 05/04/2012 1815   CO2 30 09/14/2023 1039   CO2 29 05/04/2012 1815   GLUCOSE 139 (H) 09/14/2023 1039   GLUCOSE 69 05/04/2012 1815   BUN 15 09/14/2023 1039   BUN 12 05/04/2012 1815   CREATININE 1.14 09/14/2023 1039   CREATININE 0.95 08/19/2023 0818   CALCIUM 9.2 09/14/2023 1039   CALCIUM 9.2 05/04/2012 1815   EGFR 97 08/19/2023 0818   GFRNONAA >60 09/14/2023 1039   GFRNONAA >60 05/22/2023 0936   GFRNONAA 106 12/05/2020 1014   Lab Results  Component Value Date   CHOL 207 (H) 09/10/2023   HDL 28 (L) 09/10/2023   LDLCALC 156 (H) 09/10/2023   LDLDIRECT 140.0 08/30/2020   TRIG 116 09/10/2023   CHOLHDL 7.4 09/10/2023   Lab Results  Component Value Date   HGBA1C 6.3 (H) 08/19/2023   IMAGING past 24 hours MR BRAIN WO CONTRAST Result Date: 09/14/2023 CLINICAL DATA:  Neuro deficit, acute,  stroke suspected. Progressive left-sided weakness. Progressive dysphasia. EXAM: MRI HEAD WITHOUT CONTRAST TECHNIQUE: Multiplanar, multiecho pulse sequences of the brain and surrounding structures were obtained without intravenous contrast. COMPARISON:  MR head 09/09/2023. FINDINGS: Brain: The diffusion-weighted images redemonstrate an 8 mm acute/subacute infarct involving the right cerebral peduncle. Previously noted restricted diffusion in the left hippocampus, potentially related to seizure activity has resolved. A new punctate focus of  restricted diffusion is present in the anterior insular cortex. Restricted diffusion in the anterior right frontal lobe and along the atrium of the right lateral ventricle is similar the prior study. Diffusion changes within the genu of the corpus callosum have improved. No new hemorrhage is present. No new foci of restricted diffusion are present. Periventricular T2 hyperintensities in the left hemisphere are stable. Chronic encephalomalacia of the right frontal operculum is stable. White matter changes extend along the right cerebral peduncle into the pons. The brainstem and cerebellum are otherwise within normal limits. Ex vacuo dilation of the right lateral ventricle is noted. No significant extraaxial fluid collection is present. The internal auditory canals are within normal limits. Vascular: Insert normal flow Skull and upper cervical spine: The craniocervical junction is normal. Upper cervical spine is within normal limits. Marrow signal is unremarkable. Sinuses/Orbits: The paranasal sinuses and mastoid air cells are clear. Bilateral lens replacements are noted. Globes and orbits are otherwise unremarkable. Other: IMPRESSION: 1. New punctate focus of restricted diffusion in the anterior insular cortex compatible with a new acute/subacute infarct. 2. 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 Tumor is considered less likely. 3. Stable restricted diffusion in the anterior right frontal lobe and along the atrium of the right lateral ventricle consistent with treated tumor. 4. Improved diffusion changes within the genu of the corpus callosum suggesting these were ischemic. 5. Stable chronic encephalomalacia of the right frontal operculum. 6. Stable periventricular T2 hyperintensities in the left hemisphere. This likely reflects the sequela of chronic microvascular ischemia. Electronically Signed   By: Marin Roberts M.D.   On: 09/14/2023 15:32   Vitals:   09/14/23 2005  09/15/23 0015 09/15/23 0455 09/15/23 0817  BP: (!) 142/84 (!) 142/81 133/73 (!) 149/92  Pulse: 88 67 64 62  Resp: 17 17 18    Temp: 98 F (36.7 C) 98 F (36.7 C) 98 F (36.7 C) 97.7 F (36.5 C)  TempSrc: Oral   Oral  SpO2: 95% 96% 98% 99%  Weight:      Height:       PHYSICAL EXAM General:  Alert, well-nourished, well-developed middle-age Caucasian male in no acute distress Psych: Labile mood with outburst, easily frustrated CV: Regular rate and rhythm on monitor Respiratory:  Regular, unlabored respirations on room air GI: Abdomen soft and nontender  NEURO:  Mental Status: AA&Ox3, patient is able to give clear and coherent history Speech/Language: Speech is nonfluent.  Expressive aphasia decreased comprehension, attention, concentration.   Cranial Nerves:  II: PERRL.  Left hemianopia with decreased light perception III, IV, VI: EOMI. Eyelids elevate symmetrically.  V: Sensation is intact to light touch and symmetrical to face.  VII: Face is symmetrical resting and smiling VIII: hearing intact to voice. IX, X: Palate elevates symmetrically. Phonation is normal.  ZO:[id number] shrug 5/5. XII: tongue is midline without fasciculations. Motor: LUE:.  Flaccid LLE: 2/5 Tone: is normal and bulk is normal Sensation- Intact to light touch bilaterally.  Coordination: No overt ataxia noted.  Gait- deferred  Most Recent NIH: 7    ASSESSMENT/PLAN Mr. Alan Grape  Wise is a 53 y.o. 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 Barbaraann Cao for MRI brain and EEG monitoring.   NIH on Admission:7.  Acute and subacute lacunar infarcts Etiology:  likely due to small vessel disease due to radiation changes versus Avastin vascular risk factors of hyperlipidemia  and borderline diabetes MRI brain without contrast: New punctate focus of restricted diffusion in the anterior insular cortex compatible with new acute/subacute infarct.  Stable 8 mm acute/subacute infarct involving the right cerebral pedicle.  Tumor is considered less likely.  Stable restricted diffusion in the anterior right frontal lobe and along the atrium of the right lateral ventricle consistent with treated tumor.  Improved effusion changes within the genu of the corpus callosum suggesting these were ischemic.  Stable chronic encephalomalacia of the right frontal operculum.  Stable periventricular T2 hyperintensities in the left hemisphere.  This likely reflects the sequela of chronic microvascular ischemia. MRI brain with and without contrast:  Constellation of findings over this series of multiple MRIs without and with contrast since 09/07/2023 suspicious for: infiltrative tumor progression into the right cerebral peduncle superimposed scattered acute and subacute lacunar infarcts, with several from 09/07/2023 now faded on diffusion and with discrete post-ischemic appearing enhancement.  2D Echo 09/10/23: LVEF 55-60%, agitated saline contrast bubble study was negative with no evidence of intra-arterial shunt. LDL 156 HgbA1c 6.3 VTE prophylaxis -subcu Lovenox aspirin 81 mg daily prior to admission, start Eliquis (apixaban) daily per discussion with Oncology given normal stroke workup,  cancer diagnosis with avastin and ongoing DWI signal abnormalities.  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 Note/plan to be edited by MD as needed.    Lynnae January, DNP, AGACNP-BC [facility] Please use AMION for contact information & EPIC for messaging.  I have personally obtained history,examined this patient, reviewed notes, independently viewed imaging studies, participated in medical decision making and plan of care.ROS completed by me personally and pertinent positives fully documented  I have made any additions or clarifications directly to the above note. Agree with note above.  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  Patient has had an extensive stroke evaluation  recently and has failed antiplatelet therapy.  Discussed with Dr. Barbaraann Cao neuro-oncologist who feels patient may benefit with switching to Eliquis to help with treatment for both radiation necrosis and stroke prevention.  Physical occupational speech therapy consults.  Consider short-term inpatient rehab if patient qualifies.  Long discussion with patient and his wife at the bedside as well as his parents and answered questions.  Continue lamotrigine at the current dose for seizures and add Vimpat.  Dr. Melynda Ripple to follow long-term EEG monitoring and Dr. Barbaraann Cao to discuss treatment plan with family.  Greater than 50% time during this 50-minute visit was spent in counseling and coordination of care and discussion with patient and family and answered questions.  Discussed with  , Dr. Barbaraann Cao and Dr. Alanda Slim.  Stroke team will sign off.  Kindly call for questions.  Delia Heady, MD Medical Director [facility] Pager: [phone] 09/15/2023 9:45 PM  To contact Stroke Continuity provider, please refer to [email or web address]. After hours, contact General Neurology

## 2023-09-15 NOTE — TOC Initial Note (Signed)
 Transition of Care [facility]) - Initial/Assessment Note    Patient Details  Name: Alan Wise MRN: [id number] Date of Birth: 09/09/1970  Transition of [facility] CM/SW Contact:    Kermit Balo, RN Phone Number: 09/15/2023, 2:58 PM  Clinical Narrative:                  Pt is from home with his spouse. She is with him all the time.  Wife provides needed transportation and manages his medications. Current recommendations are for home health. Wife states the neurologist wants to re-eval in a day or 2 and pt may need CIR.  TOC will follow.   Expected Discharge Plan: Home w Home Health Services Barriers to Discharge: Continued Medical Work up   Patient Goals and CMS Choice   CMS Medicare.gov Compare Post Acute Care list provided to:: Patient Represenative (must comment) Choice offered to / list presented to : Patient, Spouse      Expected Discharge Plan and Services   Discharge Planning Services: CM Consult   Living arrangements for the past 2 months: Single Family Home                                      Prior Living Arrangements/Services Living arrangements for the past 2 months: Single Family Home Lives with:: Spouse Patient language and need for interpreter reviewed:: Yes Do you feel safe going back to the place where you live?: Yes        Care giver support system in place?: Yes (comment)   Criminal Activity/Legal Involvement Pertinent to Current Situation/Hospitalization: No - Comment as needed  Activities of Daily Living   ADL Screening (condition at time of admission) Independently performs ADLs?: No Does the patient have a NEW difficulty with bathing/dressing/toileting/self-feeding that is expected to last >3 days?: Yes (Initiates electronic notice to provider for possible OT consult) Does the patient have a NEW difficulty with getting in/out of bed, walking, or climbing stairs that is expected to last >3 days?: Yes (Initiates electronic notice to  provider for possible PT consult) Does the patient have a NEW difficulty with communication that is expected to last >3 days?: Yes (Initiates electronic notice to provider for possible SLP consult) Is the patient deaf or have difficulty hearing?: No Does the patient have difficulty seeing, even when wearing glasses/contacts?: Yes Does the patient have difficulty concentrating, remembering, or making decisions?: Yes  Permission Sought/Granted                  Emotional Assessment Appearance:: Appears stated age   Affect (typically observed): Accepting Orientation: : Oriented to Self, Oriented to Place, Oriented to Situation (Aphasia)   Psych Involvement: No (comment)  Admission diagnosis:  Dizziness [R42] Dysphagia [R13.10] Patient Active Problem List   Diagnosis Date Noted   Dysphagia 09/14/2023   Gait disorder 09/14/2023   Seizure disorder (HCC) 09/10/2023   Uncontrolled type 2 diabetes mellitus with hypoglycemia, with long-term current use of insulin (HCC) 09/10/2023   Acute CVA (cerebrovascular accident) (HCC) 09/10/2023   History of CVA (cerebrovascular accident) 09/09/2023   Essential hypertension 09/09/2023   Status epilepticus (HCC) 05/14/2023   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 [place] [address] Phone: [phone] Fax: [phone]  [facility] [address] [place] [address] Phone: [phone] Fax:  [phone]     Social Drivers of Health (SDOH) Social History: SDOH Screenings   Food Insecurity: No Food Insecurity (09/14/2023)  Housing: Low Risk  (09/14/2023)  Transportation Needs: No Transportation Needs (09/14/2023)  Utilities: Not At Risk (09/14/2023)  Alcohol Screen: Low Risk  (05/01/2022)  Depression (PHQ2-9): Low Risk  (02/16/2023)  Financial Resource Strain: Low Risk  (12/05/2022)   [facility] from [facility]  Social Connections: Moderately Isolated (09/14/2023)  Tobacco Use: Low Risk  (09/14/2023)   SDOH Interventions:     Readmission Risk Interventions     No data to display

## 2023-09-16 ENCOUNTER — Encounter: Payer: Self-pay | Admitting: Internal Medicine

## 2023-09-16 ENCOUNTER — Inpatient Hospital Stay (HOSPITAL_COMMUNITY)

## 2023-09-16 ENCOUNTER — Other Ambulatory Visit (HOSPITAL_COMMUNITY): Payer: Self-pay

## 2023-09-16 DIAGNOSIS — R933 Abnormal findings on diagnostic imaging of other parts of digestive tract: Secondary | ICD-10-CM

## 2023-09-16 DIAGNOSIS — I6789 Other cerebrovascular disease: Secondary | ICD-10-CM | POA: Diagnosis not present

## 2023-09-16 DIAGNOSIS — C719 Malignant neoplasm of brain, unspecified: Secondary | ICD-10-CM | POA: Diagnosis not present

## 2023-09-16 DIAGNOSIS — R569 Unspecified convulsions: Secondary | ICD-10-CM | POA: Diagnosis not present

## 2023-09-16 DIAGNOSIS — E1169 Type 2 diabetes mellitus with other specified complication: Secondary | ICD-10-CM | POA: Diagnosis not present

## 2023-09-16 DIAGNOSIS — R1310 Dysphagia, unspecified: Secondary | ICD-10-CM | POA: Diagnosis not present

## 2023-09-16 LAB — GLUCOSE, CAPILLARY
Glucose-Capillary: 120 mg/dL — ABNORMAL HIGH (ref 70–99)
Glucose-Capillary: 107 mg/dL — ABNORMAL HIGH (ref 70–99)
Glucose-Capillary: 114 mg/dL — ABNORMAL HIGH (ref 70–99)
Glucose-Capillary: 143 mg/dL — ABNORMAL HIGH (ref 70–99)

## 2023-09-16 LAB — COMPREHENSIVE METABOLIC PANEL
ALT: 24 U/L (ref 0–44)
AST: 20 U/L (ref 15–41)
Albumin: 3.8 g/dL (ref 3.5–5.0)
Alkaline Phosphatase: 70 U/L (ref 38–126)
Anion gap: 11 (ref 5–15)
BUN: 12 mg/dL (ref 6–20)
CO2: 27 mmol/L (ref 22–32)
Calcium: 9.3 mg/dL (ref 8.9–10.3)
Chloride: 102 mmol/L (ref 98–111)
Creatinine, Ser: 0.99 mg/dL (ref 0.61–1.24)
GFR, Estimated: >60 mL/min (ref >60)
Glucose, Bld: 116 mg/dL — ABNORMAL HIGH (ref 70–99)
Potassium: 3.9 mmol/L (ref 3.5–5.1)
Sodium: 140 mmol/L (ref 135–145)
Total Bilirubin: 0.7 mg/dL (ref 0.0–1.2)
Total Protein: 6.5 g/dL (ref 6.5–8.1)

## 2023-09-16 LAB — CBC
HCT: 43.6 % (ref 39.0–52.0)
Hemoglobin: 15.6 g/dL (ref 13.0–17.0)
MCH: 29.9 pg (ref 26.0–34.0)
MCHC: 35.8 g/dL (ref 30.0–36.0)
MCV: 83.5 fL (ref 80.0–100.0)
Platelets: 195 10*3/uL (ref 150–400)
RBC: 5.22 MIL/uL (ref 4.22–5.81)
RDW: 12.5 % (ref 11.5–15.5)
WBC: 8 10*3/uL (ref 4.0–10.5)
nRBC: 0 % (ref 0.0–0.2)

## 2023-09-16 LAB — HEMOGLOBIN A1C
Hgb A1c MFr Bld: 5.4 % (ref 4.8–5.6)
Mean Plasma Glucose: 108.28 mg/dL

## 2023-09-16 LAB — MAGNESIUM: Magnesium: 2 mg/dL (ref 1.7–2.4)

## 2023-09-16 LAB — VITAMIN B12: Vitamin B-12: 251 pg/mL (ref 180–914)

## 2023-09-16 LAB — CK: Total CK: 37 U/L — ABNORMAL LOW (ref 49–397)

## 2023-09-16 LAB — PHOSPHORUS: Phosphorus: 3.6 mg/dL (ref 2.5–4.6)

## 2023-09-16 MED ORDER — LAMOTRIGINE 100 MG PO TABS: 200.0000 mg | ORAL_TABLET | Freq: Every day | ORAL | Status: DC

## 2023-09-16 MED ORDER — LAMOTRIGINE 25 MG PO TABS
50.0000 mg | ORAL_TABLET | Freq: Once | ORAL | Status: AC
Start: 2023-09-16 — End: 2023-09-16
  Administered 2023-09-16: 50 mg via ORAL
  Filled 2023-09-16: qty 2

## 2023-09-16 MED ORDER — LAMOTRIGINE 25 MG PO TABS: 150.0000 mg | ORAL_TABLET | Freq: Every day | ORAL | Status: DC

## 2023-09-16 MED ORDER — VITAMIN B-12 1000 MCG PO TABS
1000.0000 ug | ORAL_TABLET | Freq: Every day | ORAL | Status: DC
  Administered 2023-09-18 – 2023-09-20 (×3): 1000 ug via ORAL
  Filled 2023-09-16 (×3): qty 1

## 2023-09-16 MED ORDER — CYANOCOBALAMIN 1000 MCG/ML IJ SOLN
1000.0000 ug | Freq: Once | INTRAMUSCULAR | Status: AC
  Administered 2023-09-17: 1000 ug via INTRAMUSCULAR
  Filled 2023-09-16: qty 1

## 2023-09-16 MED ORDER — VITAMIN B-12 1000 MCG PO TABS: 1000.0000 ug | ORAL_TABLET | Freq: Every day | ORAL | Status: DC

## 2023-09-16 MED ORDER — AMLODIPINE BESYLATE 5 MG PO TABS
10.0000 mg | ORAL_TABLET | Freq: Every day | ORAL | Status: DC
Start: 2023-09-16 — End: 2023-09-20
  Administered 2023-09-16 – 2023-09-20 (×5): 10 mg via ORAL
  Filled 2023-09-16 (×5): qty 2

## 2023-09-16 MED ORDER — LAMOTRIGINE 25 MG PO TABS
150.0000 mg | ORAL_TABLET | Freq: Every day | ORAL | Status: DC
  Administered 2023-09-17: 150 mg via ORAL
  Filled 2023-09-16: qty 2

## 2023-09-16 MED ORDER — LAMOTRIGINE 100 MG PO TABS
200.0000 mg | ORAL_TABLET | Freq: Every day | ORAL | Status: DC
  Administered 2023-09-16: 200 mg via ORAL
  Filled 2023-09-16: qty 2

## 2023-09-16 MED ORDER — CYANOCOBALAMIN 1000 MCG/ML IJ SOLN
1000.0000 ug | Freq: Once | INTRAMUSCULAR | Status: AC
  Administered 2023-09-16: 1000 ug via INTRAMUSCULAR
  Filled 2023-09-16: qty 1

## 2023-09-16 NOTE — Progress Notes (Signed)
 Subjective: Wife states patient had 2 episodes yesterday during which she had right lower extremity jerking which at times involves whole body as well as confusion.  Hold after 9 AM today when she was unhooked from EEG where he was confused.  These episodes are lasting anywhere from 3 to 7 minutes.  Wife also states patient has been fixated on his phone after the event this morning  ROS: negative except above Examination  Vital signs in last 24 hours: Temp:  [98 F (36.7 C)-98.3 F (36.8 C)] 98.2 F (36.8 C) (03/26 1117) Pulse Rate:  [62-66] 62 (03/26 1117) Resp:  [17-18] 17 (03/26 1117) BP: (153-184)/(83-99) 157/91 (03/26 1117) SpO2:  [96 %-99 %] 97 % (03/26 1117)  General: lying in bed, NAD Neuro: MS: Alert, oriented, follows commands, does seem to have tangential thoughts and at times perseverating CN: Left hemianopia, rest of the cranial nerves intact Motor: 4/5 in all extremities with left hemiparesis   Basic Metabolic Panel: Recent Labs  Lab 09/14/23 1039 09/16/23 0531  NA 138 140  K 4.2 3.9  CL 102 102  CO2 30 27  GLUCOSE 139* 116*  BUN 15 12  CREATININE 1.14 0.99  CALCIUM 9.2 9.3  MG  --  2.0  PHOS  --  3.6    CBC: Recent Labs  Lab 09/14/23 1039 09/16/23 0531  WBC 10.2 8.0  NEUTROABS 7.6  --   HGB 15.4 15.6  HCT 44.7 43.6  MCV 85.1 83.5  PLT 208 195     Coagulation Studies: No results for input(s): "LABPROT", "INR" in the last 72 hours.  Imaging personally reviewed  MRI brain with contrast 09/15/2023: Constellation of findings over this series of multiple MRIs without and with contrast since 09/07/2023 suspicious for: infiltrative tumor progression into the right cerebral peduncle.superimposed scattered acute and subacute lacunar infarcts, with several from 09/07/2023 now faded on diffusion and with discrete post-ischemic appearing enhancement.   ASSESSMENT AND PLAN: 53 year old male with worsening mental status and episodes of right lower extremity  jerking with confusion as well as new strokes on MRI  Oligodendroglioma Acute ischemic stroke Breakthrough seizure -Episodes yesterday afternoon did not show any concomitant EEG change.  However due to the focal nature, it is possible that these episodes were epileptic.  Discussed with wife about increasing lamotrigine to 200 mg at bedtime, continue 150 mg every morning for now -Patient is also on Eliquis for strokes -If episodes of confusion persist, will need to discuss with Dr. Barbaraann Cao if it could be related to MRI findings of tumor progression -Continue seizure precautions -DC LTM EEG as events I do not have any EEG correlate -Discussed plan with patient's wife at bedside as well as patient's parents  I have spent a total of  36  minutes with the patient reviewing hospital notes,  test results, labs and examining the patient as well as establishing an assessment and plan that was discussed personally with the patient.  > 50% of time was spent in direct patient care.        Lindie Spruce Epilepsy [facility] For questions after 5pm please refer to AMION to reach the Neurologist on call

## 2023-09-16 NOTE — Consult Note (Addendum)
 Attending physician's note   I have taken a history, reviewed the chart, and examined the patient. I performed a substantive portion of this encounter, including complete performance of at least one of the key components, in conjunction with the APP. I agree with the APP's note, impression, and recommendations with my edits.   53 year old male with complex history as outlined below, admitted 09/09/2023 with aphasia.  Has had multiple brain imaging studies with most recent MRI brain yesterday showing infiltrative tumor progression into the right cerebral peduncle, superimposed scattered acute and subacute lacunar infarcts with postischemic appearing enhancement.  Underwent MBS today with concern for premature closure of the pharyngoesophageal segment (UES) causing consistent backflow into piriform sinuses.  This finding is most likely neurologic in etiology.  Pretty limited role for endoscopy in this situation as esophageal dilation would be unlikely to modify this issue.  Otherwise does not seem to have esophageal phase dysphagia which would be more amenable to endoscopic intervention.  Instead, agree with recommendations as outlined by SLP.  Could consider barium esophagram at some point when able to better tolerate intake to evaluate for more distal pathology.  Study results and recommendations reviewed with patient along with spouse at bedside.  All questions answered.  Inpatient GI service will remain available as needed.  [address] [place], [place] [phone] office         Consultation  Referring Provider: TR[facility]/ Alanda Slim Primary Care Physician:  Lorre Munroe, NP Primary Gastroenterologist:  Dr.Wohl/ [place]  Reason for Consultation: Dysphagia, concern for esophageal component per speech pathology  HPI: Alan Wise is a 53 y.o. 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  He has had some cognitive and language impairment initially noted in January. Hospitalized on 09/09/2023 with concerns for acute CVA having aphasia. Imaging at that time with concern for multifocal ischemic infarcts. TEE was attempted on 09/11/2023 but aborted due to difficult anatomy and cardiology was going to reattempt with a pediatric scope.  This was attempted to be done at [facility].  He was allowed discharge to home, and re presented on 09/14/2023 with worsening alteration in mental status.  His wife said he was unable to take his pills that day and was unable to walk Repeat MRI now suspicious for infiltrative tumor progression into the right cerebral peduncle with superimposed scattered acute and subacute lacunar infarcts.  He has also had some breakthrough seizures.  Patient has had mood lability He has been placed on Eliquis.  Seen by speech path yesterday for bedside swallowing eval and at that time there were some intermittent coughs with thin liquids with and without vocalizing after swallow.  Modified swallow study was recommended and that was done today MBS shows demonstration of pharyngeal esophageal and suspected esophageal dysphagia.  The paraesophageal segment closes prematurely causing consistent backflow into his piriform sinuses through the study and esophagus appeared to remain open after the swallow on several occasions.  He was noted to have some residue in his proximal esophagus with thicker consistencies in addition to backflow. At the end of the study with thin barium there was some penetration to the vocal cords the cleared.  Ultimately  it was felt to be at some increased risk of aspiration but was recommended for regular  diet with thin liquids, with positional recommendations, small bites and sips between bites etc. and upright positioning.  We were asked to evaluate regarding recommendations for any further GI evaluation.  Patient has not had prior EGD. Patient is attempting to explain his symptoms but very difficult to follow him.  Past Medical History:  Diagnosis Date   Allergy    Complication of anesthesia    pt reports he is starting to have more difficulty arousing after surgery   Diabetes mellitus (HCC)    GERD (gastroesophageal reflux disease)    Headache    History of kidney stones    Hyperlipidemia    Renal disorder    kidney stones    Past Surgical History:  Procedure Laterality Date   APPLICATION OF CRANIAL NAVIGATION Right 01/17/2021   Procedure: APPLICATION OF CRANIAL NAVIGATION;  Surgeon: Bedelia Person, MD;  Location: [facility] OR;  Service: Neurosurgery;  Laterality: Right;   COLONOSCOPY  09/03/1994   COLONOSCOPY WITH PROPOFOL N/A 01/07/2023   Procedure: COLONOSCOPY WITH PROPOFOL;  Surgeon: [name], MD;  Location: [facility] ENDOSCOPY;  Service: Endoscopy;  Laterality: N/A;  NOT TOO EARLY   CYSTOSCOPY WITH STENT PLACEMENT Right 01/25/2016   Procedure: CYSTOSCOPY WITH STENT PLACEMENT;  Surgeon: Hildred Laser, MD;  Location: [facility] ORS;  Service: Urology;  Laterality: Right;   FRAMELESS  BIOPSY WITH BRAINLAB Right 01/17/2021   Procedure: RIGHT STEREOTACTIC BIOPSY OF INSULAR LESION;  Surgeon: Bedelia Person, MD;  Location[facility] OR;  Service: Neurosurgery;  Laterality: Right;   KNEE ARTHROSCOPY W/ MENISCAL REPAIR Right 06/23/1988   POLYPECTOMY  01/07/2023   Procedure: POLYPECTOMY;  Surgeon: [name], MD;  Location: [facility] ENDOSCOPY;  Service: Endoscopy;;   removal of birthmark  06/08/2013   SKIN CANCER EXCISION  06/23/2012   TYMPANOSTOMY TUBE PLACEMENT  08/06/1978   URETEROSCOPY WITH HOLMIUM LASER LITHOTRIPSY Right 01/25/2016   Procedure: URETEROSCOPY WITH HOLMIUM LASER LITHOTRIPSY;   Surgeon: Hildred Laser, MD;  Location: [facility] ORS;  Service: Urology;  Laterality: Right;   URETHRAL STRICTURE DILATATION     visual inspection of vocal cord     1973   WISDOM TOOTH EXTRACTION      Prior to Admission medications   Medication Sig Start Date End Date Taking? Authorizing Provider  amLODipine (NORVASC) 5 MG tablet Take 1 tablet (5 mg total) by mouth daily. 07/09/23  Yes Vaslow, Georgeanna Lea, MD  aspirin EC 81 MG tablet Take 1 tablet (81 mg total) by mouth daily. Swallow whole. 09/08/23  Yes Vaslow, Georgeanna Lea, MD  atorvastatin (LIPITOR) 40 MG tablet Take 1 tablet (40 mg total) by mouth daily. 09/08/23  Yes Vaslow, Georgeanna Lea, MD  cholecalciferol (VITAMIN D3) 25 MCG (1000 UNIT) tablet Take 1 tablet (1,000 Units total) by mouth daily. 08/10/23  Yes Vaslow, Georgeanna Lea, MD  diazePAM, 15 MG Dose, (VALTOCO 15 MG DOSE) 2 x 7.5 MG/0.1ML LQPK Place 15 mg into the nose once as needed for up to 1 dose (seizure). 05/15/23  Yes Azucena Fallen, MD  lamoTRIgine (LAMICTAL) 150 MG tablet Take 1 tablet (150 mg total) by mouth 2 (two) times daily. 09/07/23  Yes Vaslow, Georgeanna Lea, MD  propranolol (INDERAL) 20 MG tablet TAKE 1 TABLET BY MOUTH TWICE A DAY 08/20/23  Yes Vaslow, Georgeanna Lea, MD    Current Facility-Administered Medications  Medication Dose Route Frequency Provider Last Rate Last Admin  acetaminophen (TYLENOL) tablet 650 mg  650 mg Oral Q6H PRN Lewie Chamber, MD       Or   acetaminophen (TYLENOL) suppository 650 mg  650 mg Rectal Q6H PRN Lewie Chamber, MD       amLODipine (NORVASC) tablet 10 mg  10 mg Oral Daily Candelaria Stagers T, MD   10 mg at 09/16/23 0910   apixaban (ELIQUIS) tablet 5 mg  5 mg Oral BID Hammons, Kimberly B, RPH   5 mg at 09/16/23 0910   atorvastatin (LIPITOR) tablet 40 mg  40 mg Oral Daily Candelaria Stagers T, MD   40 mg at 09/16/23 0910   [START ON 09/17/2023] cyanocobalamin (VITAMIN B12) injection 1,000 mcg  1,000 mcg Intramuscular Once Almon Hercules, MD       Followed by    Melene Muller ON 09/18/2023] cyanocobalamin (VITAMIN B12) tablet 1,000 mcg  1,000 mcg Oral Daily Gonfa, Taye T, MD       feeding supplement (ENSURE ENLIVE / ENSURE PLUS) liquid 237 mL  237 mL Oral BID BM Lewie Chamber, MD   237 mL at 09/16/23 0910   insulin aspart (novoLOG) injection 0-5 Units  0-5 Units Subcutaneous QHS Gonfa, Taye T, MD       insulin aspart (novoLOG) injection 0-9 Units  0-9 Units Subcutaneous TID WC Almon Hercules, MD   1 Units at 09/15/23 1626   lacosamide (VIMPAT) 150 mg in sodium chloride 0.9 % 25 mL IVPB  150 mg Intravenous BID PRN Almon Hercules, MD       [START ON 09/17/2023] lamoTRIgine (LAMICTAL) tablet 150 mg  150 mg Oral Daily Pham, Minh Q, RPH-CPP       lamoTRIgine (LAMICTAL) tablet 200 mg  200 mg Oral QHS Pham, Minh Q, RPH-CPP       propranolol (INDERAL) tablet 20 mg  20 mg Oral BID Alanda Slim, Taye T, MD   20 mg at 09/16/23 0910   sodium chloride flush (NS) 0.9 % injection 3 mL  3 mL Intravenous Q12H Lewie Chamber, MD   3 mL at 09/16/23 0910    Allergies as of 09/14/2023 - Review Complete 09/14/2023  Allergen Reaction Noted   Contrast media [iodinated contrast media] Swelling 01/17/2021    Family History  Problem Relation Age of Onset   Hyperlipidemia Father    Hypertension Father    Diabetes Father    Benign prostatic hyperplasia Father    Stroke Maternal Grandmother    Diabetes Maternal Grandmother    Stroke Paternal Grandmother    Hypertension Paternal Grandmother    Kidney disease Neg Hx    Prostate cancer Neg Hx     Social History   Socioeconomic History   Marital status: Married    Spouse name: Not on file   Number of children: Not on file   Years of education: Not on file   Highest education level: Not on file  Occupational History   Not on file  Tobacco Use   Smoking status: Never   Smokeless tobacco: Never  Vaping Use   Vaping status: Never Used  Substance and Sexual Activity   Alcohol use: Not Currently    Comment: rare   Drug use: No    Sexual activity: Not Currently  Other Topics Concern   Not on file  Social History Narrative   Not on file   Social Drivers of Health   Financial Resource Strain: Low Risk  (12/05/2022)   Received from [facility], [facility]   Overall Financial Resource Strain (CARDIA)    Difficulty of Paying Living Expenses: Not hard at all  Food Insecurity: No Food Insecurity (09/14/2023)   Hunger Vital Sign    Worried About Running Out of Food in the Last Year: Never true    Ran Out of Food in the Last Year: Never true  Transportation Needs: No Transportation Needs (09/14/2023)   PRAPARE - Administrator, Civil Service (Medical): No    Lack of Transportation (Non-Medical): No  Physical Activity: Not on file  Stress: Not on file  Social Connections: Moderately Isolated (09/14/2023)   Social Connection and Isolation Panel [NHANES]    Frequency of Communication with Friends and Family: More than three times a week    Frequency of Social Gatherings with Friends and Family: More than three times a week    Attends Religious Services: Never    Database administrator or Organizations: No    Attends Banker Meetings: Never    Marital Status: Married  Catering manager Violence: Not At Risk (09/14/2023)   Humiliation, Afraid, Rape, and Kick questionnaire    Fear of Current or Ex-Partner: No    Emotionally Abused: No    Physically Abused: No    Sexually Abused: No    Review of Systems: Pertinent positive and negative review of systems were noted in the above HPI section.  All other review of systems was otherwise negative.   Physical Exam: Vital signs in last 24 hours: Temp:  [98 F (36.7 C)-98.2 F (36.8 C)] 98.2 F (36.8 C) (03/26 1117) Pulse Rate:  [62-66] 62 (03/26 1117) Resp:  [17] 17 (03/26 1117) BP: (154-184)/(83-99) 157/91 (03/26 1117) SpO2:  [96 %-99 %] 97 % (03/26 1117) Last BM Date : 09/15/23 General:   Alert,   Well-developed, thin  older WM, pleasant and cooperative in NAD- wife at bedside.  Expressive aphasia, speaking but unable to express what he is trying to say Head:  Normocephalic and atraumatic. Eyes:  Sclera clear, no icterus.   Conjunctiva pink. Ears:  Normal auditory acuity. Nose:  No deformity, discharge,  or lesions. Mouth:  No deformity or lesions.   Neck:  Supple; no masses or thyromegaly. Lungs:  Clear throughout to auscultation.   No wheezes, crackles, or rhonchi.  Heart:  Regular rate and rhythm; no murmurs, clicks, rubs,  or gallops. Abdomen:  Soft,nontender, BS active,nonpalp mass or hsm.   Rectal:  not done  Msk:  Symmetrical without gross deformities. . Pulses:  Normal pulses noted. Extremities:  Without clubbing or edema. Neurologic:  Alert and  oriented x4;  grossly normal neurologically. Skin:  Intact without significant lesions or rashes.. Psych:  Alert and cooperative. Normal mood and affect.  Intake/Output from previous day: 03/25 0701 - 03/26 0700 In: 440 [P.O.:440] Out: 1250 [Urine:1250] Intake/Output this shift: Total I/O In: 240 [P.O.:240] Out: 250 [Urine:250]  Lab Results: Recent Labs    09/14/23 1039 09/16/23 0531  WBC 10.2 8.0  HGB 15.4 15.6  HCT 44.7 43.6  PLT 208 195   BMET Recent Labs    09/14/23 1039 09/16/23 0531  NA 138 140  K 4.2 3.9  CL 102 102  CO2 30 27  GLUCOSE 139* 116*  BUN 15 12  CREATININE 1.14 0.99  CALCIUM 9.2 9.3   LFT Recent Labs    09/16/23 0531  PROT 6.5  ALBUMIN 3.8  AST 20  ALT 24  ALKPHOS 70  BILITOT 0.7  PT/INR No results for input(s): "LABPROT", "INR" in the last 72 hours. Hepatitis Panel No results for input(s): "HEPBSAG", "HCVAB", "HEPAIGM", "HEPBIGM" in the last 72 hours.   IMPRESSION:  #75 53 year old white male with history of oligodendroglioma initially diagnosed in 2022 who has now been proven to have progression of tumor on MRI this admission.  In addition found to have multiple  scattered small lacunar infarcts  He presented with symptoms of CVA with aphasia and inability to walk. He has been started on Eliquis  He has also had breakthrough seizures this admission  #2 dysphagia-seems to be acute since this admission. Modified swallowing study today showing evidence of pharyngeal esophageal and possible esophageal issues.  He has been shown to have premature closure of the pharyngeal esophageal segment causing backflow into his piriform sinuses and some residue in his proximal esophagus  Upper esophageal sphincter dysfunction is not something that can be corrected with EGD. Suspect this still may be related to post CVA dysphagia  Plan; no role for EGD at this time which would be high risk and low yield and patient with breakthrough seizures and acute CVAs, and would not offer any therapeutic benefit.  This was discussed with the patient and wife at bedside  Recommendation is to continue speech path recommendations regarding diet, upright positioning, supervision with meals, small bites followed by sips of liquids  At some point can consider a barium swallow, but would not put him through this at present.  GI will be available if needed.      Amy Esterwood PA-C 09/16/2023, 4:35 PM

## 2023-09-16 NOTE — Progress Notes (Signed)
 LTM EEG discontinued - no skin breakdown at [facility].

## 2023-09-16 NOTE — Progress Notes (Signed)
 Modified Barium Swallow Study  Patient Details  Name: Alan Wise MRN: [id number] Date of Birth: Oct 19, 1970  Today's Date: 09/16/2023  Modified Barium Swallow completed.  Full report located under Chart Review in the Imaging Section.  History of Present Illness 53 yr old presenting 3/24 with difficulty walking and swallowing. MRI brain revealed new punctate focus of restricted diffusion in the anterior insular cortex compatible with new acute/subacute infarct. Recent admission to [facility] ED 3/19-3/21 with AMS and new CVA. 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 Wife says aphasia initiated a year ago slowely decling but feels he is at his baseline with aphasia and is receiving outpatient ST per previous SLP note. BSE 09/10/23 initial cough with liquids not repeated with additional trials, complete mastication and clearance with regular textures. Recommmende regular/thin   Clinical Impression Pt demonstrates pharyngoesophageal and suspected esophageal dysphagia with DIGEST score of 2 (moderate impairment). Pt's pharyngoesophageal segment closes prematurely causing consistent backflow into his pyriform sinuses throughout the study and esophagus appeared to remain open after the swallow on several occasions. He had residue in his proximal esophagus with thicker consistencies in addition to backflow possibly explaining sudden coughing during meals. A Mendelsohn maneuver was attempted to prolong PES opening however he was unable to adequately perform. Material in the esophagus ascended several times with mild amount reaching pharynx. At the end of the study with thin barium there was penetration to the vocal cords that cleared however when fluoro turned back on aspiration was observed and uncertain if from pyriform sinus residue or from possible reflux. Min-mild pyriform sinus residue consistently present not  completely cleared with second swallows. Mastication of regular texture was timely without residual. Recommend upgrade to regular and educated wife to order textures he can masticate easier as he does have decreased sensation on left side oral cavity. Continue thin liquids, meds whole in applesauce, pt prefers cups over straws, alternate liquids and solids and place food on right side of oral cavity. ST will continue to follow and trial exercises to promote PES opening. Pt may benefit from further esophageal assessment with GI consult. Factors that may increase risk of adverse event in presence of aspiration Rubye Oaks & Clearance Coots 2021):    Swallow Evaluation Recommendations Recommendations: PO diet PO Diet Recommendation: Regular;Thin liquids (Level 0) Liquid Administration via: Cup (pt prefers cup over straw) Medication Administration: Whole meds with puree Supervision: Patient able to self-feed;Full supervision/cueing for swallowing strategies Swallowing strategies  : Slow rate;Small bites/sips;Follow solids with liquids;Check for pocketing or oral holding Postural changes: Position pt fully upright for meals;Stay upright 30-60 min after meals Oral care recommendations: Oral care BID (2x/day) Recommended consults: Consider GI consultation;Consider esophageal assessment      [name] 09/16/2023,3:37 PM

## 2023-09-16 NOTE — Procedures (Addendum)
 Patient Name: Alan Wise  MRN: [id number]  Epilepsy Attending: Charlsie Quest  Referring Physician/Provider: Charlsie Quest, MD  Duration: 09/15/2023 1503 to 09/16/2023 4098   Patient history: 53yo M with hx of oligodendroglioma followed by Dr. 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 EEG to evaluate for seizure   Level of alertness: Awake, asleep   AEDs during EEG study: LTG   Technical aspects: This EEG study was done with scalp electrodes positioned according to the 10-20 International system of electrode placement. Electrical activity was reviewed with band pass filter of 1-70Hz , sensitivity of 7 uV/mm, display speed of 74mm/sec with a 60Hz  notched filter applied as appropriate. EEG data were recorded continuously and digitally stored.  Video monitoring was available and reviewed as appropriate.   Description: The posterior dominant rhythm consists of 8-9 Hz activity of moderate voltage (25-35 uV) seen predominantly in posterior head regions, asymmetric ( right<right )  and reactive to eye opening and eye closing. Sleep was characterized by vertex waves, sleep spindles (12 to 14 Hz), maximal frontocentral region. EEG showed continuous 3 to 6 Hz theta-delta slowing in right hemisphere, maximal right temporal region. Hyperventilation and photic stimulation were not performed.      ABNORMALITY - Continuous slow, right hemisphere, maximal right temporal region   IMPRESSION: This study is suggestive of cortical dysfunction arising from ight hemisphere, maximal right temporal region likely secondary to underlying structural abnormality. No seizures or epileptiform discharges were seen throughout the recording.   [name]

## 2023-09-16 NOTE — Progress Notes (Signed)
 PROGRESS NOTE  Alan Wise GNF:[id number] DOB: 1970/07/13   PCP: Lorre Munroe, NP  Patient is from: Home.  Lives with his wife.  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  Reportedly unable to tolerate various trials of food consistencies per wife.  He had a televisit with neuro-oncology, Dr. Barbaraann Cao who recommended coming to ED for long-term EEG and CVA workup.   In ED, stable vitals.  CMP within normal except for mild hyperglycemia to 139.  CBC with differential without significant finding.  MRI brain ordered.  Neurology consulted.  Started on IV fluid.  MRI brain without contrast showed new punctate focus of restricted diffusion in the anterior insular cortex compatible with a new acute/subacute infarct, stable 8 mm acute/subacute infarct involving the right cerebral peduncle, stable restricted diffusion in the anterior right frontal lobe and along the atrium of the right lateral ventricle consistent with treated tumor, improved diffusion changes within the genu of the corpus callosum suggesting these were ischemic, stable chronic encephalomalacia of the right frontal operculum, and stable periventricular T2 hyperintensities in the left Hemisphere likely reflects the sequela of chronic microvascular ischemia.  Subjective: Seen and examined earlier this morning.  No major events last night.  He has significant expressive aphasia and told nurse to his wife for  responses to questions.  Per wife, he is feeling better and stronger.  However, she is concerned about him going home with home health especially now he is on Eliquis.  She is hoping to get him to inpatient rehab.  She also says she has some questions for neurology and asking if they can swing by and see him.  Objective: Vitals:   09/15/23 2344 09/16/23 0525 09/16/23 0809 09/16/23 1117  BP: (!) 154/91 (!) 159/92 (!) 167/99 (!) 157/91  Pulse: 62 64 62 62  Resp: 17 17 17 17   Temp: 98 F (36.7 C) 98 F (36.7 C) 98.1 F (36.7 C) 98.2 F (36.8 C)  TempSrc:   Oral Oral  SpO2: 96% 99% 98% 97%  Weight:      Height:        Examination:  GENERAL: No apparent distress.  Nontoxic. HEENT: MMM.  Vision and hearing grossly intact.  NECK: Supple.  No apparent JVD.  RESP:  No IWOB.  Fair aeration bilaterally. CVS:  RRR. Heart sounds normal.  ABD/GI/GU: BS+. Abd soft, NTND.  MSK/EXT:  Moves extremities. No apparent deformity. No edema.  SKIN: no apparent skin lesion or wound NEURO: Awake, alert and oriented fairly.  Expressive aphasia.  Seems he has some receptive aphasia as well.  He has difficulty understanding some instructions during exam.  Left homonymous hemianopsia.  Subtle left facial droop.  Motor 5/5 in all muscle groups of UE and LE bilaterally, Normal tone. Light sensation intact in all dermatomes of upper and lower ext bilaterally. Patellar reflex symmetric.  Finger-to-nose slightly off on the left. PSYCH: Calm. Normal affect.   Consultants:  Neurology Neuro-oncology  Procedures: EEG  Microbiology summarized: None  Assessment and plan: Acute CVA/confusion: Presents with increased gait difficulty, left-sided  weakness, confusion and new onset dysphagia.  MRI findings as above but the stroke is not big enough to explain his symptoms fully..  Recent MRA head and neck and TTE without significant finding.  TTE was attempted last hospitalization.  He was discharged on aspirin and  Lipitor.  LDL was 156.  A1c 5.4. -Appreciate recommendation by neuro-oncology and neurology-started Eliquis and stopped aspirin -Continue statin. -Continue PT/OT  Seizure disorder: LTM EEG suggested cortical dysfunction arising from the right hemisphere, maximal right temporal region likely secondary to underlying structural abnormality but no seizure or epileptiform discharge.  He is on Lamictal.  Compliant.  Lamictal level normal.  Few episodes yesterday and this morning.  Nothing new on EEG this morning. -Neurology increase night dose Lamictal to 200 mg.  Continue 150 mg in the morning -Continue seizure precaution  Dysphagia: Per wife progressive difficulty ingesting solids or liquids despite various consistencies being trialed.  Overall she states it appears he is having more difficulty swallowing -On dysphagia 3 diet per SLP -Plan for MBS today  Oligodendroglioma S/p radiation with radiation induced brain necrosis: Patient's wife reports left homonymous hemianopsia for over 3 months that they attributed to radiation all lower his radiation is over 2 years ago.  No MRI finding to suggest occipital abnormality. -Defer to neurology oncology  NIDDM-2 with hyperglycemia and hyperlipidemia: A1c 5.4% (was 6.3% on 2/26).  Does not seem to be on medications at home Recent Labs  Lab 09/15/23 1229 09/15/23 1533 09/15/23 2105 09/16/23 0616 09/16/23 1118  GLUCAP 128* 142* 113* 120* 107*  -Continue SSI-very sensitive -Continue home Lipitor  Confusion/altered mental status: Continues to have intermittent confusion and spells.  B12 251.  Delirium? -Reorientation and delirium precautions -Vitamin B12 injection followed by p.o.  Gait disorder: Could be due to CVA and/or dehydration.  Feels better after IV fluid hydration. -PT/OT commended home health but wife is interested in inpatient rehab.  Will notify therapy -Vitamin B12 injection as above   Essential hypertension: BP slightly  elevated. -Increase amlodipine to 10 mg daily -Continue home Medrol  Body mass index is 22.53 kg/m.          DVT prophylaxis:   apixaban (ELIQUIS) tablet 5 mg  Code Status: Full code Family Communication: Updated patient's wife at bedside Level of care: Telemetry Medical Status is: Inpatient Remains inpatient appropriate because: Acute CVA, dysphagia, confusion, spells   Final disposition: Likely home once medically cleared   55 minutes with more than 50% spent in reviewing records, counseling patient/family and coordinating care.   Sch Meds:  Scheduled Meds:  amLODipine  10 mg Oral Daily   apixaban  5 mg Oral BID   atorvastatin  40 mg Oral Daily   [START ON 09/17/2023] vitamin B-12  1,000 mcg Oral Daily   feeding supplement  237 mL Oral BID BM   insulin aspart  0-5 Units Subcutaneous QHS   insulin aspart  0-9 Units Subcutaneous TID WC   [START ON 09/17/2023] lamoTRIgine  150 mg Oral Daily   lamoTRIgine  200 mg Oral QHS   propranolol  20 mg Oral BID   sodium chloride flush  3 mL Intravenous Q12H   Continuous Infusions:  lacosamide (VIMPAT) IV     PRN Meds:.acetaminophen **OR** acetaminophen, lacosamide (VIMPAT) IV  Antimicrobials: Anti-infectives (From admission, onward)    None        I have personally reviewed the following labs and images: CBC: Recent Labs  Lab 09/14/23 1039 09/16/23 0531  WBC 10.2 8.0  NEUTROABS 7.6  --   HGB 15.4 15.6  HCT 44.7 43.6  MCV 85.1 83.5  PLT 208 195   BMP &GFR Recent Labs  Lab 09/14/23 1039 09/16/23 0531  NA 138 140  K 4.2 3.9  CL 102 102  CO2 30 27  GLUCOSE 139* 116*  BUN 15 12  CREATININE 1.14 0.99  CALCIUM 9.2 9.3  MG  --  2.0  PHOS  --  3.6   Estimated Creatinine Clearance: 83.9 mL/min (by C-G formula based on SCr of 0.99 mg/dL). Liver & Pancreas: Recent Labs  Lab 09/14/23 1039 09/16/23 0531  AST 20 20  ALT 22 24  ALKPHOS 70 70  BILITOT 0.8 0.7  PROT 6.8 6.5  ALBUMIN 4.1 3.8   No  results for input(s): "LIPASE", "AMYLASE" in the last 168 hours. No results for input(s): "AMMONIA" in the last 168 hours. Diabetic: Recent Labs    09/16/23 0531  HGBA1C 5.4   Recent Labs  Lab 09/15/23 1229 09/15/23 1533 09/15/23 2105 09/16/23 0616 09/16/23 1118  GLUCAP 128* 142* 113* 120* 107*   Cardiac Enzymes: Recent Labs  Lab 09/16/23 0531  CKTOTAL 37*   No results for input(s): "PROBNP" in the last 8760 hours. Coagulation Profile: No results for input(s): "INR", "PROTIME" in the last 168 hours. Thyroid Function Tests: No results for input(s): "TSH", "T4TOTAL", "FREET4", "T3FREE", "THYROIDAB" in the last 72 hours. Lipid Profile: No results for input(s): "CHOL", "HDL", "LDLCALC", "TRIG", "CHOLHDL", "LDLDIRECT" in the last 72 hours. Anemia Panel: Recent Labs    09/16/23 0531  VITAMINB12 251   Urine analysis:    Component Value Date/Time   COLORURINE YELLOW (A) 01/24/2016 2152   APPEARANCEUR Cloudy (A) 02/20/2016 1441   LABSPEC 1.010 01/24/2016 2152   LABSPEC 1.026 05/04/2012 2052   PHURINE 6.0 01/24/2016 2152   GLUCOSEU 3+ (A) 02/20/2016 1441   GLUCOSEU Negative 05/04/2012 2052   HGBUR 3+ (A) 01/24/2016 2152   BILIRUBINUR Negative 02/20/2016 1441   BILIRUBINUR Negative 05/04/2012 2052   KETONESUR NEGATIVE 01/24/2016 2152   PROTEINUR 100 (A) 09/07/2023 1218   NITRITE Negative 02/20/2016 1441   NITRITE NEGATIVE 01/24/2016 2152   LEUKOCYTESUR Negative 02/20/2016 1441   LEUKOCYTESUR Negative 05/04/2012 2052   Sepsis Labs: Invalid input(s): "PROCALCITONIN", "LACTICIDVEN"  Microbiology: No results found for this or any previous visit (from the past 240 hours).  Radiology Studies: No results found.     Cathyrn Deas T. Hiawatha Dressel [facility]  If 7PM-7AM, please contact night-coverage [email or web address] 09/16/2023, 1:40 PM

## 2023-09-16 NOTE — Progress Notes (Signed)
   Inpatient Rehab Admissions Coordinator :  Per therapy change in recommendations, patient was screened for CIR candidacy by Ottie Glazier RN MSN.  At this time patient appears to be a potential candidate for CIR. I will place a rehab consult per protocol for full assessment. Please call me with any questions.  Ottie Glazier RN MSN Admissions Coordinator [phone]

## 2023-09-16 NOTE — Progress Notes (Signed)
 Physical Therapy Treatment Patient Details Name: Alan Wise MRN: [id number] DOB: 1971-01-15 Today's Date: 09/16/2023   History of Present Illness Pt presenting 3/24 with difficulty walking and swallowing. MRI brain revealed new punctate focus of restricted diffusion in the anterior insular cortex compatible with new acute/subacute infarct. Recent admission to [facility] ED 3/19-3/21 with AMS and new CVA. PMH includes oligodendroglioma, brain tumor with necrosis, h/o CVA with aphasia, DM, seizure disorder    PT Comments  Pt with fair tolerance to treatment today. Pt today finally off EEG and able to ambulate in hallway however required much more assistance today compared to yesterday requiring +2 Min A for ambulation. Pt initially able to ambulate to gym with Min A via 2 person HHA however pt noted to be rather unsteady drifting to the L and reliant on external support. Trialed RW and pt still required Min A due to visual deficits for obstacle navigation. DC recs updated to CIR. Pt is highly motivated, can tolerate 3 hours of therapy a day, and has 24 hour supervision at home. PT will continue to follow.      If plan is discharge home, recommend the following: A little help with walking and/or transfers;A little help with bathing/dressing/bathroom;Assistance with cooking/housework;Assist for transportation;Help with stairs or ramp for entrance   Can travel by private vehicle        Equipment Recommendations  None recommended by PT    Recommendations for Other Services Rehab consult     Precautions / Restrictions Precautions Precautions: Fall Recall of Precautions/Restrictions: Intact Restrictions Weight Bearing Restrictions Per Provider Order: No     Mobility  Bed Mobility Overal bed mobility: Needs Assistance Bed Mobility: Supine to Sit, Sit to Supine     Supine to sit: Supervision, HOB elevated Sit to supine: Supervision, HOB elevated, Contact guard assist   General bed  mobility comments: Pt completed task with verbal cues to continue scooting foward until B feet were flat on the floor.    Transfers Overall transfer level: Needs assistance Equipment used: None Transfers: Sit to/from Stand Sit to Stand: Min assist           General transfer comment: Min A to steady once standing.    Ambulation/Gait Ambulation/Gait assistance: +2 physical assistance, Min assist, +2 safety/equipment Gait Distance (Feet): 175 Feet (145+30) Assistive device: 2 person hand held assist, Rolling walker (2 wheels) Gait Pattern/deviations: Drifts right/left, Staggering left, Narrow base of support, Decreased stride length, Step-through pattern Gait velocity: decreased     General Gait Details: Pt initially able to ambulate to gym with Min A via 2 person HHA. pt noted to be rather unsteady drifting to the L and reliant on external support. Trialed RW and pt still required Min A due to visual deficits for obstacle navigation.   Stairs Stairs: Yes Stairs assistance: Min assist Stair Management: One rail Right, Step to pattern, Forwards Number of Stairs: 4 General stair comments: no LOB noted. Very slow and cautious   Wheelchair Mobility     Tilt Bed    Modified Rankin (Stroke Patients Only)       Balance Overall balance assessment: Needs assistance Sitting-balance support: No upper extremity supported, Feet supported Sitting balance-Leahy Scale: Good Sitting balance - Comments: static sitting EOB and able to weight, bend/reach to donn socks onto B feet while seated in bed   Standing balance support: During functional activity, Bilateral upper extremity supported Standing balance-Leahy Scale: Poor Standing balance comment: reliant on RW  Communication Communication Communication: Impaired Factors Affecting Communication: Difficulty expressing self;Reduced clarity of speech  Cognition Arousal: Alert Behavior During  Therapy: WFL for tasks assessed/performed   PT - Cognitive impairments: No apparent impairments                       PT - Cognition Comments: Difficulty with finding words d/t h/o aphasia Following commands: Intact      Cueing Cueing Techniques: Verbal cues, Tactile cues  Exercises      General Comments General comments (skin integrity, edema, etc.): VSS      Pertinent Vitals/Pain Pain Assessment Pain Assessment: No/denies pain    Home Living                          Prior Function            PT Goals (current goals can now be found in the care plan section) Progress towards PT goals: Progressing toward goals    Frequency    Min 2X/week      PT Plan      Co-evaluation              AM-PAC PT "6 Clicks" Mobility   Outcome Measure  Help needed turning from your back to your side while in a flat bed without using bedrails?: None Help needed moving from lying on your back to sitting on the side of a flat bed without using bedrails?: A Little Help needed moving to and from a bed to a chair (including a wheelchair)?: A Little Help needed standing up from a chair using your arms (e.g., wheelchair or bedside chair)?: A Little Help needed to walk in hospital room?: A Lot Help needed climbing 3-5 steps with a railing? : A Little 6 Click Score: 18    End of Session Equipment Utilized During Treatment: Gait belt Activity Tolerance: Patient tolerated treatment well Patient left: in bed;with call bell/phone within reach;with bed alarm set;with family/visitor present Nurse Communication: Mobility status PT Visit Diagnosis: Other abnormalities of gait and mobility (R26.89);History of falling (Z91.81)     Time: 3244-0102 PT Time Calculation (min) (ACUTE ONLY): 21 min  Charges:    $Gait Training: 8-22 mins PT General Charges $$ ACUTE PT VISIT: 1 Visit                     Shela Nevin, PT, DPT Acute Rehab Services [phone]    Gladys Damme 09/16/2023, 4:04 PM

## 2023-09-16 NOTE — TOC Benefit Eligibility Note (Signed)
 Pharmacy Patient Advocate Encounter  Insurance verification completed.   The patient is insured through [facility] ADVANTAGE/RX ADVANCE   Ran test claim for Eliqius. Currently a quantity of 60 is a 30 day supply and the co-pay is $0.00 .  Ran test claim for Vimpat (generic). Currently a quantity of 60 is a 30 day supply and the co-pay is $0.00 .  This test claim was processed through [facility]- copay amounts may vary at other pharmacies due to pharmacy/plan contracts, or as the patient moves through the different stages of their insurance plan.

## 2023-09-17 DIAGNOSIS — E1169 Type 2 diabetes mellitus with other specified complication: Secondary | ICD-10-CM | POA: Diagnosis not present

## 2023-09-17 DIAGNOSIS — I6789 Other cerebrovascular disease: Secondary | ICD-10-CM | POA: Diagnosis not present

## 2023-09-17 DIAGNOSIS — C719 Malignant neoplasm of brain, unspecified: Secondary | ICD-10-CM | POA: Diagnosis not present

## 2023-09-17 DIAGNOSIS — R1310 Dysphagia, unspecified: Secondary | ICD-10-CM | POA: Diagnosis not present

## 2023-09-17 LAB — GLUCOSE, CAPILLARY
Glucose-Capillary: 105 mg/dL — ABNORMAL HIGH (ref 70–99)
Glucose-Capillary: 134 mg/dL — ABNORMAL HIGH (ref 70–99)
Glucose-Capillary: 118 mg/dL — ABNORMAL HIGH (ref 70–99)
Glucose-Capillary: 176 mg/dL — ABNORMAL HIGH (ref 70–99)

## 2023-09-17 MED ORDER — LAMOTRIGINE 25 MG PO TABS: 50.0000 mg | ORAL_TABLET | Freq: Once | ORAL | Status: DC

## 2023-09-17 MED ORDER — LOSARTAN POTASSIUM 25 MG PO TABS
25.0000 mg | ORAL_TABLET | Freq: Every day | ORAL | Status: DC
  Administered 2023-09-17 – 2023-09-20 (×4): 25 mg via ORAL
  Filled 2023-09-17 (×4): qty 1

## 2023-09-17 MED ORDER — LAMOTRIGINE 100 MG PO TABS
200.0000 mg | ORAL_TABLET | Freq: Every day | ORAL | Status: DC
Start: 2023-09-17 — End: 2023-09-20
  Administered 2023-09-17 – 2023-09-19 (×3): 200 mg via ORAL
  Filled 2023-09-17 (×3): qty 2

## 2023-09-17 MED ORDER — LAMOTRIGINE 25 MG PO TABS
150.0000 mg | ORAL_TABLET | Freq: Every morning | ORAL | Status: DC
  Administered 2023-09-18 – 2023-09-20 (×3): 150 mg via ORAL
  Filled 2023-09-17 (×3): qty 2

## 2023-09-17 MED ORDER — LAMOTRIGINE 100 MG PO TABS: 200.0000 mg | ORAL_TABLET | Freq: Two times a day (BID) | ORAL | Status: DC

## 2023-09-17 NOTE — Progress Notes (Signed)
 PROGRESS NOTE  Alan Wise:[id number] DOB: March 19, 1971   PCP: Lorre Munroe, NP  Patient is from: Home.  Lives with his wife.  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  Reportedly unable to tolerate various trials of food consistencies per wife.  He had a televisit with neuro-oncology, Dr. Barbaraann Cao who recommended coming to ED for long-term EEG and CVA workup.   In ED, stable vitals.  CMP within normal except for mild hyperglycemia to 139.  CBC with differential without significant finding.  MRI brain ordered.  Neurology consulted.  Started on IV fluid.  MRI brain without contrast showed new punctate focus of restricted diffusion in the anterior insular cortex compatible with a new acute/subacute infarct, stable 8 mm acute/subacute infarct involving the right cerebral peduncle, stable restricted diffusion in the anterior right frontal lobe and along the atrium of the right lateral ventricle consistent with treated tumor, improved diffusion changes within the genu of the corpus callosum suggesting these were ischemic, stable chronic encephalomalacia of the right frontal operculum, and stable periventricular T2 hyperintensities in the left Hemisphere likely reflects the sequela of chronic microvascular ischemia.  Neurology/neuro-oncology started Eliquis and adjusted AEDs.  In regards to dysphagia, evaluated by SLP and GI and felt to be neurologic.    Therapy  recommended CIR.  Subjective: Seen and examined earlier this morning.  No major events overnight of this morning.  No further incidents.  Tenderness to his wife for response to questions.  His wife states he feels better.   Objective: Vitals:   09/16/23 2345 09/17/23 0340 09/17/23 0726 09/17/23 1113  BP: (!) 157/87 134/85 (!) 150/86 (!) 152/90  Pulse: 61 (!) 55 63 66  Resp: 16 14 17 17   Temp: 97.9 F (36.6 C) 97.9 F (36.6 C) (!) 97.4 F (36.3 C) 98.4 F (36.9 C)  TempSrc: Axillary Axillary Oral Oral  SpO2: 98% 97% 98% 98%  Weight:      Height:        Examination:  GENERAL: No apparent distress.  Nontoxic. HEENT: MMM.  Vision and hearing grossly intact.  NECK: Supple.  No apparent JVD.  RESP:  No IWOB.  Fair aeration bilaterally. CVS:  RRR. Heart sounds normal.  ABD/GI/GU: BS+. Abd soft, NTND.  MSK/EXT:  Moves extremities. No apparent deformity. No edema.  SKIN: no apparent skin lesion or wound NEURO: Awake and alert and oriented fairly.  Expressive aphasia.  Seems to have receptive aphasia as well.  Left homonymous hemianopsia PSYCH: Calm. Normal affect.   Consultants:  Neurology-signed off Neuro-oncology-signed off  Procedures: EEG  Microbiology summarized: None  Assessment and plan: Acute CVA/confusion: Presents with increased gait difficulty, left-sided weakness, confusion and new onset dysphagia.  MRI findings as above but the stroke is not big enough to explain his symptoms fully..  Recent MRA head and neck and TTE without significant finding.  TTE was attempted last hospitalization.  He was discharged on aspirin and Lipitor.  LDL was 156.  A1c 5.4. -Appreciate recommendation by neuro-oncology and neurology-started Eliquis and stopped aspirin -Continue statin. -PT/OT recommended  CIR. CIR following.  Seizure disorder: LTM EEG suggested cortical dysfunction arising from the right hemisphere, maximal right temporal region likely secondary to underlying structural  abnormality but no seizure or epileptiform discharge.  He is on Lamictal.  Compliant.  Lamictal level normal.  Few episodes yesterday and this morning.  Nothing new on EEG this morning. -Neurology increase night dose Lamictal to 200 mg.  Continue 150 mg in the morning -Continue seizure precaution -Neurology signed off  Dysphagia: Per wife progressive difficulty ingesting solids or liquids despite various consistencies being trialed.  Overall she states it appears he is having more difficulty swallowing -Evaluated by GI.  Felt to be neurologic. -Upgraded to regular diet by SLP.   Oligodendroglioma S/p radiation with radiation induced brain necrosis: Patient's wife reports left homonymous hemianopsia for over 3 months that they attributed to radiation all lower his radiation is over 2 years ago.  No MRI finding to suggest occipital abnormality. -Defer to neurology oncology  NIDDM-2 with hyperglycemia and hyperlipidemia: A1c 5.4% (was 6.3% on 2/26).  Does not seem to be on medications at home Recent Labs  Lab 09/16/23 1118 09/16/23 1638 09/16/23 2110 09/17/23 0613 09/17/23 1114  GLUCAP 107* 114* 143* 105* 134*  -Continue SSI-very sensitive -Continue home Lipitor  Confusion/altered mental status: Continues to have intermittent confusion and spells.  B12 normal at 251.  Delirium? -Reorientation and delirium precautions -Vitamin B12 injection 1000 mcg on 3/26 and 27 followed by p.o.  Gait disorder: Could be due to CVA and/or dehydration.  Feels better after IV fluid hydration. -PT/OT commended home health but wife is interested in inpatient rehab.  Will notify therapy -Vitamin B12 injection as above   Essential hypertension: BP slightly elevated. -Increased amlodipine to 10 mg daily on 3/23 -Continue home Inderal 20 mg twice daily -Add low-dose losartan -Continue monitoring  Body mass index is 22.53 kg/m.          DVT prophylaxis:   apixaban (ELIQUIS) tablet 5 mg  Code  Status: Full code Family Communication: Updated patient's wife at bedside Level of care: Telemetry Medical Status is: Inpatient Remains inpatient appropriate because: Acute CVA, dysphagia, confusion   Final disposition: CIR   55 minutes with more than 50% spent in reviewing records, counseling patient/family and coordinating care.   Sch Meds:  Scheduled Meds:  amLODipine  10 mg Oral Daily   apixaban  5 mg Oral BID   atorvastatin  40 mg Oral Daily   [START ON 09/18/2023] vitamin B-12  1,000 mcg Oral Daily   feeding supplement  237 mL Oral BID BM   insulin aspart  0-5 Units Subcutaneous QHS   insulin aspart  0-9 Units Subcutaneous TID WC   [START ON 09/18/2023] lamoTRIgine  150 mg Oral q morning   And   lamoTRIgine  200 mg Oral QHS   losartan  25 mg Oral Daily   propranolol  20 mg Oral BID   sodium chloride flush  3 mL Intravenous Q12H   Continuous Infusions:  lacosamide (VIMPAT) IV     PRN Meds:.acetaminophen **OR** acetaminophen, lacosamide (VIMPAT) IV  Antimicrobials: Anti-infectives (From admission, onward)    None        I have personally reviewed the following labs and images: CBC: Recent Labs  Lab 09/14/23 1039 09/16/23 0531  WBC 10.2 8.0  NEUTROABS 7.6  --   HGB 15.4 15.6  HCT 44.7 43.6  MCV 85.1 83.5  PLT 208 195   BMP &GFR Recent Labs  Lab 09/14/23 1039  09/16/23 0531  NA 138 140  K 4.2 3.9  CL 102 102  CO2 30 27  GLUCOSE 139* 116*  BUN 15 12  CREATININE 1.14 0.99  CALCIUM 9.2 9.3  MG  --  2.0  PHOS  --  3.6   Estimated Creatinine Clearance: 83.9 mL/min (by C-G formula based on SCr of 0.99 mg/dL). Liver & Pancreas: Recent Labs  Lab 09/14/23 1039 09/16/23 0531  AST 20 20  ALT 22 24  ALKPHOS 70 70  BILITOT 0.8 0.7  PROT 6.8 6.5  ALBUMIN 4.1 3.8   No results for input(s): "LIPASE", "AMYLASE" in the last 168 hours. No results for input(s): "AMMONIA" in the last 168 hours. Diabetic: Recent Labs    09/16/23 0531  HGBA1C 5.4    Recent Labs  Lab 09/16/23 1118 09/16/23 1638 09/16/23 2110 09/17/23 0613 09/17/23 1114  GLUCAP 107* 114* 143* 105* 134*   Cardiac Enzymes: Recent Labs  Lab 09/16/23 0531  CKTOTAL 37*   No results for input(s): "PROBNP" in the last 8760 hours. Coagulation Profile: No results for input(s): "INR", "PROTIME" in the last 168 hours. Thyroid Function Tests: No results for input(s): "TSH", "T4TOTAL", "FREET4", "T3FREE", "THYROIDAB" in the last 72 hours. Lipid Profile: No results for input(s): "CHOL", "HDL", "LDLCALC", "TRIG", "CHOLHDL", "LDLDIRECT" in the last 72 hours. Anemia Panel: Recent Labs    09/16/23 0531  VITAMINB12 251   Urine analysis:    Component Value Date/Time   COLORURINE YELLOW (A) 01/24/2016 2152   APPEARANCEUR Cloudy (A) 02/20/2016 1441   LABSPEC 1.010 01/24/2016 2152   LABSPEC 1.026 05/04/2012 2052   PHURINE 6.0 01/24/2016 2152   GLUCOSEU 3+ (A) 02/20/2016 1441   GLUCOSEU Negative 05/04/2012 2052   HGBUR 3+ (A) 01/24/2016 2152   BILIRUBINUR Negative 02/20/2016 1441   BILIRUBINUR Negative 05/04/2012 2052   KETONESUR NEGATIVE 01/24/2016 2152   PROTEINUR 100 (A) 09/07/2023 1218   NITRITE Negative 02/20/2016 1441   NITRITE NEGATIVE 01/24/2016 2152   LEUKOCYTESUR Negative 02/20/2016 1441   LEUKOCYTESUR Negative 05/04/2012 2052   Sepsis Labs: Invalid input(s): "PROCALCITONIN", "LACTICIDVEN"  Microbiology: No results found for this or any previous visit (from the past 240 hours).  Radiology Studies: No results found.     Evens Meno T. Lorence Nagengast [facility]  If 7PM-7AM, please contact night-coverage [email or web address] 09/17/2023, 3:12 PM

## 2023-09-17 NOTE — Discharge Instructions (Signed)
Information on my medicine - ELIQUIS® (apixaban) ° °This medication education was reviewed with me or my healthcare representative as part of my discharge preparation.   ° ° °Why was Eliquis® prescribed for you? °Eliquis® was prescribed for you to reduce the risk of a blood clot forming that can cause a stroke. ° °What do You need to know about Eliquis® ? °Take your Eliquis® TWICE DAILY - one tablet in the morning and one tablet in the evening with or without food. If you have difficulty swallowing the tablet whole please discuss with your pharmacist how to take the medication safely. ° °Take Eliquis® exactly as prescribed by your doctor and DO NOT stop taking Eliquis® without talking to the doctor who prescribed the medication.  Stopping may increase your risk of developing a stroke.  Refill your prescription before you run out. ° °After discharge, you should have regular check-up appointments with your healthcare provider that is prescribing your Eliquis®.  In the future your dose may need to be changed if your kidney function or weight changes by a significant amount or as you get older. ° °What do you do if you miss a dose? °If you miss a dose, take it as soon as you remember on the same day and resume taking twice daily.  Do not take more than one dose of ELIQUIS at the same time to make up a missed dose. ° °Important Safety Information °A possible side effect of Eliquis® is bleeding. You should call your healthcare provider right away if you experience any of the following: °Bleeding from an injury or your nose that does not stop. °Unusual colored urine (red or dark brown) or unusual colored stools (red or black). °Unusual bruising for unknown reasons. °A serious fall or if you hit your head (even if there is no bleeding). ° °Some medicines may interact with Eliquis® and might increase your risk of bleeding or clotting while on Eliquis®. To help avoid this, consult your healthcare provider or pharmacist prior  to using any new prescription or non-prescription medications, including herbals, vitamins, non-steroidal anti-inflammatory drugs (NSAIDs) and supplements. ° °This website has more information on Eliquis® (apixaban): http://www.eliquis.com/eliquis/home  °

## 2023-09-17 NOTE — Care Plan (Signed)
 Discussed with wife.  Denies any further episodes overnight.  States he is tolerating the increase in lamotrigine well.  Denies any other concerns.  Neurology will sign off for now.  Please call us back for any further questions.  Alan Wise Annabelle Harman

## 2023-09-17 NOTE — Progress Notes (Signed)
 IP rehab admissions - I met with patient and his wife at the bedside.  They would like inpatient rehab stay and then home with wife.  Wife is caregiver for the past 3 years.  Wife has help from her mom, MIL and FIL.  Family provide 24/7 support for patient.  Wife is interested in staying with patient at night on the rehab unit.  We will open the case with Monia Pouch now and request CIR admission.  I will follow up after we hear back from insurance carrier.  [phone]

## 2023-09-17 NOTE — Plan of Care (Signed)

## 2023-09-17 NOTE — Progress Notes (Signed)
 Occupational Therapy Treatment Patient Details Name: Alan Wise MRN: [id number] DOB: 1971-01-05 Today's Date: 09/17/2023   History of present illness Pt presenting 3/24 with difficulty walking and swallowing. MRI brain revealed new punctate focus of restricted diffusion in the anterior insular cortex compatible with new acute/subacute infarct. Recent admission to [facility] ED 3/19-3/21 with AMS and new CVA. PMH includes oligodendroglioma, brain tumor with necrosis, h/o CVA with aphasia, DM, seizure disorder   OT comments  Wife present for session and states her husband was able to complete his ADL tasks and mobility @ modified independent level prior to this admission. States his primary problem was "bumping into walls/objects due to his Left field cut." Completed a shower with pt to further assess his functional ability. Linsey required min A with mobility and mod A with ADL tasks due to incoordination, L field cut and apparent deficits with planning, organization, sequencing and problem solving. Pt perseverated at times however was able to shift attention and complete task with cuing. Wife states this is a significant functional decline for her husband. Patient will benefit from intensive inpatient follow-up therapy, >3 hours/day to maximize functional level of independence with goal of returning home at higher level of independence and decreasing the burden of care. [name] has 2 young children at home and is eager to do more for himself.      If plan is discharge home, recommend the following:  A little help with walking and/or transfers;A little help with bathing/dressing/bathroom;Assistance with cooking/housework;Assist for transportation;Help with stairs or ramp for entrance;Direct supervision/assist for financial management;Direct supervision/assist for medications management   Equipment Recommendations  None recommended by OT    Recommendations for Other Services      Precautions /  Restrictions Precautions Precautions: Fall       Mobility Bed Mobility Overal bed mobility: Needs Assistance       Supine to sit: Supervision, HOB elevated          Transfers Overall transfer level: Needs assistance   Transfers: Sit to/from Stand Sit to Stand: Min assist           General transfer comment: HHA     Balance     Sitting balance-Leahy Scale: Good       Standing balance-Leahy Scale: Poor Standing balance comment: reliant on external support                           ADL either performed or assessed with clinical judgement   ADL Overall ADL's : Needs assistance/impaired Eating/Feeding: Modified independent   Grooming: Supervision/safety;Set up;Sitting   Upper Body Bathing: Minimal assistance;Sitting   Lower Body Bathing: Moderate assistance   Upper Body Dressing : Minimal assistance   Lower Body Dressing: Moderate assistance   Toilet Transfer: Minimal assistance;Ambulation   Toileting- Clothing Manipulation and Hygiene: Minimal assistance   Tub/ Shower Transfer: Minimal assistance   Functional mobility during ADLs: Minimal assistance      Extremity/Trunk Assessment Upper Extremity Assessment Upper Extremity Assessment: LUE deficits/detail LUE:  (incoordination; frequently dropping items; poor in-hand manipulation skills; attempting ot use functionally; shoulder/elbow/wrist ROM overall WFL for ROM; difficulty opposing thumb to each finger)   Lower Extremity Assessment Lower Extremity Assessment: Defer to PT evaluation        Vision   Vision Assessment?: Wears glasses for reading Visual Fields: Left homonymous hemianopsia; does not appear to see an object until it is directly in front of him   Perception Perception Perception:  Impaired Preception Impairment Details: Inattention/Neglect Perception-Other Comments: L; poor awareness   Praxis Praxis Praxis: Impaired Praxis Impairment Details:  Perseveration;Organization   Communication Communication Communication: Impaired Factors Affecting Communication: Difficulty expressing self   Cognition Arousal: Alert Behavior During Therapy: WFL for tasks assessed/performed Cognition: Cognition impaired Difficult to assess due to: Impaired communication   Awareness: Online awareness impaired Memory impairment (select all impairments): Declarative long-term memory Attention impairment (select first level of impairment): Selective attention Executive functioning impairment (select all impairments): Organization, Sequencing, Reasoning, Problem solving OT - Cognition Comments: perseveration noted when bathing; able to initiae task however required cues for organization and sequencing; pt began to bath without soap; perverated on washing face over and over and required tactile cues to switch and finish task; mod VC to bath L side of body                 Following commands: Impaired Following commands impaired:  (affected by impaired communication)      Cueing      Exercises      Shoulder Instructions       General Comments      Pertinent Vitals/ Pain       Pain Assessment Pain Assessment: Faces Faces Pain Scale: No hurt  Home Living                                          Prior Functioning/Environment              Frequency  Min 2X/week        Progress Toward Goals  OT Goals(current goals can now be found in the care plan section)  Progress towards OT goals: Progressing toward goals  Acute Rehab OT Goals Patient Stated Goal: to get better OT Goal Formulation: With patient/family Time For Goal Achievement: 09/29/23 Potential to Achieve Goals: Good ADL Goals Pt Will Perform Grooming: with supervision;standing Pt Will Perform Upper Body Dressing: with supervision;sitting Pt Will Perform Lower Body Dressing: with supervision;sit to/from stand Pt Will Transfer to Toilet: with  supervision;ambulating;regular height toilet Pt Will Perform Toileting - Clothing Manipulation and hygiene: with supervision;sit to/from stand;sitting/lateral leans Pt/caregiver will Perform Home Exercise Program: Increased strength;Both right and left upper extremity;With theraputty;With written HEP provided;Independently Additional ADL Goal #1: Pt will implement visual scanning strategies to improve visual attention to L side to increase pt safety in functional tasks Additional ADL Goal #2: Pt will complete all aspects of a shower, in sitting primarily, with supervision assist and PRN VC for safety, 1/1 opportunity.  Plan      Co-evaluation                 AM-PAC OT "6 Clicks" Daily Activity     Outcome Measure   Help from another person eating meals?: None Help from another person taking care of personal grooming?: A Little Help from another person toileting, which includes using toliet, bedpan, or urinal?: A Little Help from another person bathing (including washing, rinsing, drying)?: A Little Help from another person to put on and taking off regular upper body clothing?: A Little Help from another person to put on and taking off regular lower body clothing?: A Little 6 Click Score: 19    End of Session Equipment Utilized During Treatment: Gait belt  OT Visit Diagnosis: Unsteadiness on feet (R26.81);Other abnormalities of gait and mobility (R26.89);Muscle weakness (generalized) (M62.81);Hemiplegia and hemiparesis;Low  vision, both eyes (H54.2) Hemiplegia - Right/Left: Left Hemiplegia - dominant/non-dominant: Dominant Hemiplegia - caused by: Cerebral infarction;Unspecified (tumor)   Activity Tolerance Patient tolerated treatment well   Patient Left in chair;with call bell/phone within reach;with family/visitor present   Nurse Communication Mobility status        Time: 1610-9604 OT Time Calculation (min): 47 min  Charges: OT General Charges $OT Visit: 1 Visit OT  Treatments $Self Care/Home Management : 38-52 mins  Luisa Dago, OT/L   Acute OT Clinical Specialist Acute Rehabilitation Services Pager [phone] Office [phone]   [facility] 09/17/2023, 4:59 PM

## 2023-09-17 NOTE — Progress Notes (Signed)
 Speech Language Pathology Treatment: Dysphagia  Patient Details Name: Alan Wise MRN: [id number] DOB: 1970-08-26 Today's Date: 09/17/2023 Time: 8657-8469 SLP Time Calculation (min) (ACUTE ONLY): 49 min  Assessment / Plan / Recommendation Clinical Impression  Pt seen for extended session with therapeutic exercise and po observation/intervention with wife at bedside. Pt educated on Mendenlsohn maneuver and Shaker exercise to facilitate PES opening. He was able to keep larynx elevated for approximately one second across multiple trials. With Shaker he laid flat and able to follow directions to tuck chin and look at toes working up to 30 seconds without reports of strain on neck for 5 trials. Pt/wife given handout with exercises and advised to practice 2-3 times a day for 5-10 repetitions as pt is able.   Wife reported pt had coughing episodes yesterday with eggs, pizza and pot roast but did better with tomato soup. Today observed with graham cracker, water via cup (pt preference). Breakfast tray arrived and he consumed pancake, sausage and egg. He did not exhibit difficulty with cracker or water and was able to follow command to alternate liquids and solids with min reminders. He did have immediate throat clear and delayed coughing with eggs. Pt is very sensate to pharyngeal/upper esophageal residue and cued to continue with dry swallows followed by sip oj which helped decrease symptoms. Educated wife to consider ordering softer foods (soups, pasta, yogurts) to see if this decreases coughing episodes.   Noted GI progress note who stated "This finding is most likely neurologic in etiology. Pretty limited role for endoscopy in this situation as esophageal dilation would be unlikely to modify this issue. Otherwise does not seem to have esophageal phase dysphagia which would be more amenable to endoscopic intervention. Instead, agree with recommendations as outlined by SLP. Could consider barium  esophagram at some point when able to better tolerate intake to evaluate for more distal pathology." ST to continue intervention.    HPI HPI: 53 yr old presenting 3/24 with difficulty walking and swallowing. MRI brain revealed new punctate focus of restricted diffusion in the anterior insular cortex compatible with new acute/subacute infarct. Recent admission to [facility] ED 3/19-3/21 with AMS and new CVA. 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 Wife says aphasia initiated a year ago slowely decling but feels he is at his baseline with aphasia and is receiving outpatient ST per previous SLP note. BSE 09/10/23 initial cough with liquids not repeated with additional trials, complete mastication and clearance with regular textures. Recommmende regular/thin      SLP Plan  Continue with current plan of care      Recommendations for follow up therapy are one component of a multi-disciplinary discharge planning process, led by the attending physician.  Recommendations may be updated based on patient status, additional functional criteria and insurance authorization.    Recommendations  Diet recommendations: Regular;Thin liquid Liquids provided via: Cup Medication Administration: Whole meds with puree Supervision: Patient able to self feed (needs assist to cut at times and visualize food on tray on left) Compensations: Slow rate;Small sips/bites;Follow solids with liquid;Lingual sweep for clearance of pocketing Postural Changes and/or Swallow Maneuvers: Seated upright 90 degrees                Rehab consult Oral care BID   Intermittent Supervision/Assistance Dysphagia, pharyngoesophageal phase (R13.14)     Continue with current plan of care     [name]  09/17/2023, 8:55 AM

## 2023-09-18 DIAGNOSIS — R1314 Dysphagia, pharyngoesophageal phase: Secondary | ICD-10-CM

## 2023-09-18 DIAGNOSIS — R269 Unspecified abnormalities of gait and mobility: Secondary | ICD-10-CM | POA: Diagnosis not present

## 2023-09-18 DIAGNOSIS — I639 Cerebral infarction, unspecified: Secondary | ICD-10-CM | POA: Diagnosis not present

## 2023-09-18 DIAGNOSIS — R42 Dizziness and giddiness: Secondary | ICD-10-CM | POA: Diagnosis not present

## 2023-09-18 DIAGNOSIS — R933 Abnormal findings on diagnostic imaging of other parts of digestive tract: Secondary | ICD-10-CM

## 2023-09-18 LAB — GLUCOSE, CAPILLARY
Glucose-Capillary: 146 mg/dL — ABNORMAL HIGH (ref 70–99)
Glucose-Capillary: 118 mg/dL — ABNORMAL HIGH (ref 70–99)
Glucose-Capillary: 164 mg/dL — ABNORMAL HIGH (ref 70–99)
Glucose-Capillary: 94 mg/dL (ref 70–99)

## 2023-09-18 NOTE — Progress Notes (Signed)
 PROGRESS NOTE  Alan Wise UJW:[id number] DOB: 1970/12/01   PCP: Lorre Munroe, NP  Patient is from: Home.  Lives with his wife.  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  Reportedly unable to tolerate various trials of food consistencies per wife.  He had a televisit with neuro-oncology, Dr. Barbaraann Cao who recommended coming to ED for long-term EEG and CVA workup.   In ED, stable vitals.  CMP within normal except for mild hyperglycemia to 139.  CBC with differential without significant finding.  MRI brain ordered.  Neurology consulted.  Started on IV fluid.  MRI brain without contrast showed new punctate focus of restricted diffusion in the anterior insular cortex compatible with a new acute/subacute infarct, stable 8 mm acute/subacute infarct involving the right cerebral peduncle, stable restricted diffusion in the anterior right frontal lobe and along the atrium of the right lateral ventricle consistent with treated tumor, improved diffusion changes within the genu of the corpus callosum suggesting these were ischemic, stable chronic encephalomalacia of the right frontal operculum, and stable periventricular T2 hyperintensities in the left Hemisphere likely reflects the sequela of chronic microvascular ischemia.  Neurology/neuro-oncology started Eliquis and adjusted AEDs.  In regards to dysphagia, evaluated by SLP and GI and felt to be neurologic.    Therapy  recommended CIR.  Subjective: Seen and examined earlier this morning.  No major events overnight of this morning.  Patient's wife reports good sleep last night.  No further episodes or spells.  Objective: Vitals:   09/18/23 0332 09/18/23 0827 09/18/23 1006 09/18/23 1100  BP: 133/79 (!) 131/94 (!) 131/94 136/85  Pulse: 69 67 67 63  Resp: 18 18  18   Temp: (!) 97.5 F (36.4 C) (!) 97.4 F (36.3 C)  (!) 97.5 F (36.4 C)  TempSrc: Oral Axillary  Axillary  SpO2: 98% 96%  98%  Weight:      Height:        Examination:  GENERAL: No apparent distress.  Nontoxic. HEENT: MMM.  Vision and hearing grossly intact.  NECK: Supple.  No apparent JVD.  RESP:  No IWOB.  Fair aeration bilaterally. CVS:  RRR. Heart sounds normal.  ABD/GI/GU: BS+. Abd soft, NTND.  MSK/EXT:  Moves extremities. No apparent deformity. No edema.  SKIN: no apparent skin lesion or wound NEURO: Awake and alert and oriented fairly.  Expressive aphasia.  Seems to have receptive aphasia as well.  Left pronator drift.  Finger-to-nose off on the left.  Left homonymous hemianopsia PSYCH: Calm. Normal affect.   Consultants:  Neurology-signed off Neuro-oncology-signed off  Procedures: EEG  Microbiology summarized: None  Assessment and plan: Acute CVA/confusion: Presents with increased gait difficulty, left-sided weakness, confusion and new onset dysphagia.  MRI findings as above but the stroke is not big enough to explain his symptoms fully..  Recent MRA head and neck and TTE without significant finding.  TTE was attempted last hospitalization.  He was discharged on aspirin and Lipitor.  LDL was 156.  A1c 5.4. -Neuro-oncology and neurology-started Eliquis and stopped aspirin -Continue statin. -PT/OT recommended CIR. CIR following.  Seizure disorder: LTM EEG suggested cortical dysfunction arising from the right hemisphere, maximal right temporal region likely secondary to underlying structural abnormality but no seizure or  epileptiform discharge.  He is on Lamictal.  Compliant.  Lamictal level normal.  No further episodes since the morning of 3/26. -Neurology increase night dose Lamictal to 200 mg.  Continue 150 mg in the morning -Continue seizure precaution -Neurology signed off  Dysphagia: Per wife progressive difficulty ingesting solids or liquids despite various consistencies being trialed.  Overall she states it appears he is having more difficulty swallowing -Evaluated by GI.  Felt to be neurologic. -Upgraded to regular diet by SLP.   Oligodendroglioma S/p radiation with radiation induced brain necrosis: Patient's wife reports left homonymous hemianopsia for over 3 months that they attributed to radiation all lower his radiation is over 2 years ago.  No MRI finding to suggest occipital abnormality. -Defer to neurology oncology  NIDDM-2 with hyperglycemia and hyperlipidemia: A1c 5.4% (was 6.3% on 2/26).  Does not seem to be on medications at home Recent Labs  Lab 09/17/23 1114 09/17/23 1637 09/17/23 2141 09/18/23 0631 09/18/23 1218  GLUCAP 134* 118* 176* 146* 118*  -Continue SSI-very sensitive -Continue home Lipitor  Confusion/altered mental status: Continues to have intermittent confusion and spells.  B12 normal at 251.  Delirium? -Reorientation and delirium precautions -Received B12 injection 1000 mcg on 3/26 and 27 \ -Continue B12 p.o.  Gait disorder: Could be due to CVA and/or dehydration.  Feels better after IV fluid hydration. -PT/OT commended home health but wife is interested in inpatient rehab.  Will notify therapy -B12 as above.   Essential hypertension: BP improved. -Increased amlodipine to 10 mg daily on 3/23 -Continue home Inderal 20 mg twice daily -Add low-dose losartan on 3/27 -Continue monitoring  Body mass index is 22.53 kg/m.          DVT prophylaxis:   apixaban (ELIQUIS) tablet 5 mg  Code Status: Full code Family Communication: Updated patient's wife at  bedside Level of care: Telemetry Medical Status is: Inpatient Remains inpatient appropriate because: Safe disposition.   Final disposition: CIR   35 minutes with more than 50% spent in reviewing records, counseling patient/family and coordinating care.   Sch Meds:  Scheduled Meds:  amLODipine  10 mg Oral Daily   apixaban  5 mg Oral BID   atorvastatin  40 mg Oral Daily   vitamin B-12  1,000 mcg Oral Daily   feeding supplement  237 mL Oral BID BM   insulin aspart  0-5 Units Subcutaneous QHS   insulin aspart  0-9 Units Subcutaneous TID WC   lamoTRIgine  150 mg Oral q morning   And   lamoTRIgine  200 mg Oral QHS   losartan  25 mg Oral Daily   propranolol  20 mg Oral BID   sodium chloride flush  3 mL Intravenous Q12H   Continuous Infusions:  lacosamide (VIMPAT) IV     PRN Meds:.acetaminophen **OR** acetaminophen, lacosamide (VIMPAT) IV  Antimicrobials: Anti-infectives (From admission, onward)    None        I have personally reviewed the following labs and images: CBC: Recent Labs  Lab 09/14/23 1039 09/16/23 0531  WBC 10.2 8.0  NEUTROABS 7.6  --   HGB 15.4 15.6  HCT 44.7 43.6  MCV 85.1 83.5  PLT 208 195   BMP &GFR Recent Labs  Lab 09/14/23 1039 09/16/23 0531  NA 138 140  K 4.2 3.9  CL 102 102  CO2 30  27  GLUCOSE 139* 116*  BUN 15 12  CREATININE 1.14 0.99  CALCIUM 9.2 9.3  MG  --  2.0  PHOS  --  3.6   Estimated Creatinine Clearance: 83.9 mL/min (by C-G formula based on SCr of 0.99 mg/dL). Liver & Pancreas: Recent Labs  Lab 09/14/23 1039 09/16/23 0531  AST 20 20  ALT 22 24  ALKPHOS 70 70  BILITOT 0.8 0.7  PROT 6.8 6.5  ALBUMIN 4.1 3.8   No results for input(s): "LIPASE", "AMYLASE" in the last 168 hours. No results for input(s): "AMMONIA" in the last 168 hours. Diabetic: Recent Labs    09/16/23 0531  HGBA1C 5.4   Recent Labs  Lab 09/17/23 1114 09/17/23 1637 09/17/23 2141 09/18/23 0631 09/18/23 1218  GLUCAP 134* 118* 176* 146*  118*   Cardiac Enzymes: Recent Labs  Lab 09/16/23 0531  CKTOTAL 37*   No results for input(s): "PROBNP" in the last 8760 hours. Coagulation Profile: No results for input(s): "INR", "PROTIME" in the last 168 hours. Thyroid Function Tests: No results for input(s): "TSH", "T4TOTAL", "FREET4", "T3FREE", "THYROIDAB" in the last 72 hours. Lipid Profile: No results for input(s): "CHOL", "HDL", "LDLCALC", "TRIG", "CHOLHDL", "LDLDIRECT" in the last 72 hours. Anemia Panel: Recent Labs    09/16/23 0531  VITAMINB12 251   Urine analysis:    Component Value Date/Time   COLORURINE YELLOW (A) 01/24/2016 2152   APPEARANCEUR Cloudy (A) 02/20/2016 1441   LABSPEC 1.010 01/24/2016 2152   LABSPEC 1.026 05/04/2012 2052   PHURINE 6.0 01/24/2016 2152   GLUCOSEU 3+ (A) 02/20/2016 1441   GLUCOSEU Negative 05/04/2012 2052   HGBUR 3+ (A) 01/24/2016 2152   BILIRUBINUR Negative 02/20/2016 1441   BILIRUBINUR Negative 05/04/2012 2052   KETONESUR NEGATIVE 01/24/2016 2152   PROTEINUR 100 (A) 09/07/2023 1218   NITRITE Negative 02/20/2016 1441   NITRITE NEGATIVE 01/24/2016 2152   LEUKOCYTESUR Negative 02/20/2016 1441   LEUKOCYTESUR Negative 05/04/2012 2052   Sepsis Labs: Invalid input(s): "PROCALCITONIN", "LACTICIDVEN"  Microbiology: No results found for this or any previous visit (from the past 240 hours).  Radiology Studies: No results found.     Michaelanthony Kempton T. Johnni Wunschel [facility]  If 7PM-7AM, please contact night-coverage [email or web address] 09/18/2023, 1:55 PM

## 2023-09-18 NOTE — PMR Pre-admission (Signed)
 PMR Admission Coordinator Pre-Admission Assessment  Patient: Alan Wise is an 53 y.o., male MRN: [id number] DOB: [birth year] Height: 5\' 8"  (172.7 cm) Weight: 67.2 kg  Insurance Information HMO:     PPO:      PCP:      IPA:      80/20:      OTHER: Choice POS II, group [phone] PRIMARY: [facility]      Policy#: [id number]      Subscriber: patient Pt. Was approved by [facility] for admission 3/29-4/4 CM Name: Vickey Huger      Phone#: [phone]     Fax#: [phone] Pre-Cert#: [id number]      Employer: [employer] professor Benefits:  Phone #: [phone]     Name: checked on [email or web address] Eff. Date: 06/24/23     Deduct: $1250 [phone])      Out of Pocket Max: [phone] (met [phone])      Life Max: n/a CIR: $300 copay/admission, then 80% coverage      SNF: 80% coverage Outpatient:       Co-Pay: $0 to $52 copay per visit Home Health: 80%      Co-Pay: 20% DME: 80%     Co-Pay: 20% Providers: in network  SECONDARY:       Policy#:      Phone#:   Artist:       Phone#:   The Data processing manager" for patients in Inpatient Rehabilitation Facilities with attached "Privacy Act Statement-Health Care Records" was provided and verbally reviewed with: Patient and Family  Emergency Contact Information Contact Information     Name Relation Home Work Mobile   Height,[name] Spouse   [phone]   Feagan,[name] Mother [phone]  [phone]      Other Contacts   None on File     Current Medical History  Patient Admitting Diagnosis: R CVA  History of Present Illness: A 53 y.o. 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 Patient was recently seen on 09/09/2022 at [facility] ED for a breakthrough seizure  as well as acute stroke. Per  wife, since discharge he has been confused, dizzy and has trouble walking. This morning he was not able to take his meds and therefore was seen by Dr Barbaraann Cao via tele. He recommended admission for repeat MRI as well as overnight video EEG. Wife states he had an episode of right leg jerking for about a minute while coming back from MRI and isnt sure if that was another seizure.  Patient was seen in the [facility] ED on 09/14/23 and then was admitted.  PT/OT/SLP evaluations completed with recommendations for acute inpatient rehab admission.    Complete NIHSS TOTAL: 6  Patient's medical record from [facility] has been reviewed by the rehabilitation admission coordinator and physician.  Past Medical History  Past Medical History:  Diagnosis Date   Allergy    Complication of anesthesia    pt reports he is starting to have more difficulty arousing after surgery   Diabetes mellitus (HCC)    GERD (gastroesophageal reflux disease)    Headache    History of kidney stones    Hyperlipidemia    Renal disorder    kidney stones    Has the patient had major surgery during 100 days prior to admission? No  Family History  family history includes Benign prostatic hyperplasia in his father; Diabetes in his father and maternal grandmother; Hyperlipidemia in his father; Hypertension in his father and paternal grandmother; Stroke in his maternal grandmother and paternal grandmother.  Current Medications  Current Facility-Administered Medications:    acetaminophen (TYLENOL) tablet 650 mg, 650 mg, Oral, Q6H PRN **OR** acetaminophen (TYLENOL) suppository 650 mg, 650 mg, Rectal, Q6H PRN, Lewie Chamber, MD   amLODipine (NORVASC) tablet 10 mg, 10 mg, Oral, Daily, Alanda Slim, Taye T, MD, 10 mg at 09/18/23 1006   apixaban (ELIQUIS) tablet 5 mg, 5 mg, Oral, BID, Hammons, Kimberly B, RPH, 5 mg at 09/18/23 1006   atorvastatin (LIPITOR) tablet 40 mg, 40 mg, Oral, Daily, Alanda Slim, Taye T, MD, 40 mg at  09/18/23 1007   [COMPLETED] cyanocobalamin (VITAMIN B12) injection 1,000 mcg, 1,000 mcg, Intramuscular, Once, 1,000 mcg at 09/17/23 0846 **FOLLOWED BY** cyanocobalamin (VITAMIN B12) tablet 1,000 mcg, 1,000 mcg, Oral, Daily, Alanda Slim, Taye T, MD, 1,000 mcg at 09/18/23 1007   feeding supplement (ENSURE ENLIVE / ENSURE PLUS) liquid 237 mL, 237 mL, Oral, BID BM, Lewie Chamber, MD, 237 mL at 09/17/23 0936   insulin aspart (novoLOG) injection 0-5 Units, 0-5 Units, Subcutaneous, QHS, Gonfa, Taye T, MD   insulin aspart (novoLOG) injection 0-9 Units, 0-9 Units, Subcutaneous, TID WC, Gonfa, Taye T, MD, 1 Units at 09/18/23 0981   lacosamide (VIMPAT) 150 mg in sodium chloride 0.9 % 25 mL IVPB, 150 mg, Intravenous, BID PRN, Candelaria Stagers T, MD   lamoTRIgine (LAMICTAL) tablet 150 mg, 150 mg, Oral, q morning, 150 mg at 09/18/23 1005 **AND** lamoTRIgine (LAMICTAL) tablet 200 mg, 200 mg, Oral, QHS, Yadav, Priyanka O, MD, 200 mg at 09/17/23 2220   losartan (COZAAR) tablet 25 mg, 25 mg, Oral, Daily, Gonfa, Taye T, MD, 25 mg at 09/18/23 1005   propranolol (INDERAL) tablet 20 mg, 20 mg, Oral, BID, Gonfa, Taye T, MD, 20 mg at 09/18/23 1007   sodium chloride flush (NS) 0.9 % injection 3 mL, 3 mL, Intravenous, Q12H, Lewie Chamber, MD, 3 mL at 09/18/23 1008  Patients Current Diet:  Diet Order             Diet regular Room service appropriate? Yes with Assist; Fluid consistency: Thin  Diet effective now                   Precautions / Restrictions Precautions Precautions: Fall Restrictions Weight Bearing Restrictions Per Provider Order: No   Has the patient had 2 or more falls or a fall with injury in the past year? Yes  Prior Activity Level Limited Community (1-2x/wk): Went out about 2 X a week, not driving  Prior Functional Level Self Care: Did the patient need help bathing, dressing, using the toilet or eating? Needed some help  Indoor Mobility: Did the patient need assistance with walking from room to  room (with or without device)? Needed some help  Stairs: Did the patient need assistance with internal or external stairs (with or without device)? Needed some help  Functional Cognition: Did the patient need help planning regular tasks such as shopping or remembering to take medications? Needed some help  Patient Information Are you of Hispanic, Latino/a,or Spanish origin?: A. No, not of Hispanic, Latino/a, or Spanish origin What is your race?: A. White Do you need or want an interpreter to communicate with a doctor or health care staff?: 0. No  Patient's Response To:  Health Literacy and Transportation Is the patient able to respond to health  literacy and transportation needs?: Yes Health Literacy - How often do you need to have someone help you when you read instructions, pamphlets, or other written material from your doctor or pharmacy?: Always In the past 12 months, has lack of transportation kept you from medical appointments or from getting medications?: No In the past 12 months, has lack of transportation kept you from meetings, work, or from getting things needed for daily living?: No  Home Assistive Devices / Equipment Home Equipment: Shower seat - built in, Hand held shower head (no shower seat built in downstairs)  Prior Device Use: Indicate devices/aids used by the patient prior to current illness, exacerbation or injury? None of the above  Current Functional Level Cognition  Orientation Level: Oriented X4    Extremity Assessment (includes Sensation/Coordination)  Upper Extremity Assessment: LUE deficits/detail LUE Deficits / Details: decreased grip strength, able to flex L shoulder to ~100 degrees with reports pain, drift observed with flexion. Pt denies impairments in sensation LUE:  (incoordination; frequently dropping items; poor in-hand manipulation skills; attempting ot use functionally; shoulder/elbow/wrist ROM overall WFL for ROM; difficulty opposing thumb to each  finger) LUE Sensation: WNL LUE Coordination: decreased fine motor  Lower Extremity Assessment: Defer to PT evaluation    ADLs  Overall ADL's : Needs assistance/impaired Eating/Feeding: Modified independent Grooming: Supervision/safety, Set up, Sitting Upper Body Bathing: Minimal assistance, Sitting Lower Body Bathing: Moderate assistance Upper Body Dressing : Minimal assistance Lower Body Dressing: Moderate assistance Toilet Transfer: Minimal assistance, Ambulation Toileting- Clothing Manipulation and Hygiene: Minimal assistance Tub/ Shower Transfer: Minimal assistance Functional mobility during ADLs: Minimal assistance General ADL Comments: Pt has L eye visual deficits requiring verbal cues for sequencing and to attend to L side during functional mobility. Pt with FM impairments in L hand but was able to use functionally to complete grooming task. CGA provided for safety during functional mobility without use of AD    Mobility  Overal bed mobility: Needs Assistance Bed Mobility: Supine to Sit, Sit to Supine Supine to sit: Supervision, HOB elevated Sit to supine: Supervision, HOB elevated, Contact guard assist General bed mobility comments: Pt completed task with verbal cues to continue scooting foward until B feet were flat on the floor.    Transfers  Overall transfer level: Needs assistance Equipment used: None Transfers: Sit to/from Stand Sit to Stand: Min assist General transfer comment: HHA    Ambulation / Gait / Stairs / Psychologist, prison and probation services  Ambulation/Gait Ambulation/Gait assistance: +2 physical assistance, Min assist, +2 safety/equipment Gait Distance (Feet): 175 Feet (145+30) Assistive device: 2 person hand held assist, Rolling walker (2 wheels) Gait Pattern/deviations: Drifts right/left, Staggering left, Narrow base of support, Decreased stride length, Step-through pattern General Gait Details: Pt initially able to ambulate to gym with Min A via 2 person HHA. pt noted  to be rather unsteady drifting to the L and reliant on external support. Trialed RW and pt still required Min A due to visual deficits for obstacle navigation. Gait velocity: decreased Stairs: Yes Stairs assistance: Min assist Stair Management: One rail Right, Step to pattern, Forwards Number of Stairs: 4 General stair comments: no LOB noted. Very slow and cautious    Posture / Balance Dynamic Sitting Balance Sitting balance - Comments: static sitting EOB and able to weight, bend/reach to donn socks onto B feet while seated in bed Balance Overall balance assessment: Needs assistance Sitting-balance support: No upper extremity supported, Feet supported Sitting balance-Leahy Scale: Good Sitting balance - Comments: static sitting EOB and able to  weight, bend/reach to donn socks onto B feet while seated in bed Standing balance support: During functional activity, Bilateral upper extremity supported Standing balance-Leahy Scale: Poor Standing balance comment: reliant on external support    Special needs/care consideration Diabetic management h/o DM on insulin in acute hospital and Special service needs none   Previous Home Environment (from acute therapy documentation) Living Arrangements: Spouse/significant other, Children (5 and 10 y.o.) Available Help at Discharge: Family, Available 24 hours/day (wife available 24/7 and mother lives close by. good family and friend support) Type of Home: House Home Layout: Two level, Bed/bath upstairs (can live on main level if needed) Alternate Level Stairs-Rails:  (L for first 5 and R on second 5) Alternate Level Stairs-Number of Steps: flight of steps to 2nd floor bedroom Home Access: Stairs to enter (stone steps) Entrance Stairs-Rails: Right, Left Entrance Stairs-Number of Steps: 4 Bathroom Shower/Tub: Health visitor: Standard Home Care Services: No  Discharge Living Setting Plans for Discharge Living Setting: House, Lives with  (comment) (Lives with wife, 57 yo dtr and 5 yo son, 2 dogs, 2 cats) Type of Home at Discharge: House Discharge Home Layout: Two level, Bed/bath upstairs, Able to live on main level with bedroom/bathroom, Full bath on main level Alternate Level Stairs-Rails: Right, Left Alternate Level Stairs-Number of Steps: about 15 steps to upstairs level Discharge Home Access: Stairs to enter Entrance Stairs-Rails: Right, Left Entrance Stairs-Number of Steps: 4 Discharge Bathroom Shower/Tub: Walk-in shower, Door Discharge Bathroom Toilet: Standard Discharge Bathroom Accessibility: Yes How Accessible: Accessible via walker Does the patient have any problems obtaining your medications?: No  Social/Family/Support Systems Patient Roles: Spouse, Parent, Other (Comment) (Has wife, mom close by, and friends) Contact Information: Owens Loffler - wife Anticipated Caregiver: Leotis Shames - wife - Ability/Limitations of Caregiver: Wife does not work, provides care for patient Caregiver Availability: 24/7 Discharge Plan Discussed with Primary Caregiver: Yes Is Caregiver In Agreement with Plan?: Yes Does Caregiver/Family have Issues with Lodging/Transportation while Pt is in Rehab?: No  Goals Patient/Family Goal for Rehab: PT/OT/SLP supervision goals Expected length of stay: 10-12 days Pt/Family Agrees to Admission and willing to participate: Yes Program Orientation Provided & Reviewed with Pt/Caregiver Including Roles  & Responsibilities: Yes  Decrease burden of Care through IP rehab admission: N/A  Possible need for SNF placement upon discharge: Not planned  Patient Condition: I have reviewed medical records from [facility], spoken with CM, and patient and spouse. I met with patient at the bedside for inpatient rehabilitation assessment.  Patient will benefit from ongoing PT, OT, and SLP, can actively participate in 3 hours of therapy a day 5 days of the week, and can make measurable gains during the  admission.  Patient will also benefit from the coordinated team approach during an Inpatient Acute Rehabilitation admission.  The patient will receive intensive therapy as well as Rehabilitation physician, nursing, social worker, and care management interventions.  Due to bladder management, bowel management, safety, skin/wound care, disease management, medication administration, pain management, and patient education the patient requires 24 hour a day rehabilitation nursing.  The patient is currently Mod A with mobility and basic ADLs.  Discharge setting and therapy post discharge at home with home health is anticipated.  Patient has agreed to participate in the Acute Inpatient Rehabilitation Program and will admit today.  Preadmission Screen Completed By:  Trish Mage, 09/18/2023 11:59 AM ______________________________________________________________________   Discussed status with Dr. Shearon Stalls  on 09/20/23 at 930 and received approval for admission today.  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 Yes Does the patient require coordinated care of a physician, rehab nurse, PT, OT, and SLP to address  physical and functional deficits in the context of the above medical diagnosis(es)? Yes Addressing deficits in the following areas: balance, endurance, locomotion, strength, transferring, bowel/bladder control, bathing, dressing, feeding, grooming, toileting, cognition, speech, language, swallowing, and psychosocial support Can the patient actively participate in an intensive therapy program of at least 3 hrs of therapy 5 days a week? Yes The potential for patient to make measurable gains while on inpatient rehab is good Anticipated functional outcomes upon discharge from inpatient rehab: supervision PT, supervision OT, supervision SLP Estimated rehab length of stay to reach the above functional goals is: 10-12 days Anticipated discharge destination: Home 10. Overall Rehab/Functional Prognosis: good   MD Signature:  Angelina Sheriff, DO 09/20/2023

## 2023-09-18 NOTE — TOC Progression Note (Signed)
 Transition of Care [facility]) - Progression Note    Patient Details  Name: Alan Wise MRN: [id number] Date of Birth: February 09, 1971  [facility] of [facility]) CM/SW Contact  Kermit Balo, RN Phone Number: 09/18/2023, 1:23 PM  Clinical Narrative:     Recommendations changed to CIR. Awaiting approval through pts [employer].  TOC following.  Expected Discharge Plan: IP Rehab Facility Barriers to Discharge: Continued Medical Work up  Expected Discharge Plan and Services   Discharge Planning Services: CM Consult   Living arrangements for the past 2 months: Single Family Home                                       Social Determinants of Health (SDOH) Interventions SDOH Screenings   Food Insecurity: No Food Insecurity (09/14/2023)  Housing: Low Risk  (09/14/2023)  Transportation Needs: No Transportation Needs (09/14/2023)  Utilities: Not At Risk (09/14/2023)  Alcohol Screen: Low Risk  (05/01/2022)  Depression (PHQ2-9): Low Risk  (02/16/2023)  Financial Resource Strain: Low Risk  (12/05/2022)   Received from [facility], [facility]  Social Connections: Moderately Isolated (09/14/2023)  Tobacco Use: Low Risk  (09/14/2023)    Readmission Risk Interventions     No data to display

## 2023-09-18 NOTE — Progress Notes (Signed)
 Physical Therapy Treatment Patient Details Name: Alan Wise MRN: [id number] DOB: 1970-12-20 Today's Date: 09/18/2023   History of Present Illness Pt presenting 3/24 with difficulty walking and swallowing. MRI brain revealed new punctate focus of restricted diffusion in the anterior insular cortex compatible with new acute/subacute infarct. Recent admission to [facility] ED 3/19-3/21 with AMS and new CVA. PMH includes oligodendroglioma, brain tumor with necrosis, h/o CVA with aphasia, DM, seizure disorder    PT Comments  Pt tolerated treatment well today. Pt continues to require Min A for hallway ambulation with RW due to visual field deficits however was able to navigate stairs with CGA. No change in DC/DME recs at this time. PT will continue to follow.     If plan is discharge home, recommend the following: A little help with walking and/or transfers;A little help with bathing/dressing/bathroom;Assistance with cooking/housework;Assist for transportation;Help with stairs or ramp for entrance   Can travel by private vehicle        Equipment Recommendations  None recommended by PT    Recommendations for Other Services       Precautions / Restrictions Precautions Precautions: Fall Restrictions Weight Bearing Restrictions Per Provider Order: No     Mobility  Bed Mobility Overal bed mobility: Needs Assistance Bed Mobility: Supine to Sit     Supine to sit: Supervision, HOB elevated     General bed mobility comments: Pt completed task with verbal cues to continue scooting foward until B feet were flat on the floor.    Transfers Overall transfer level: Needs assistance Equipment used: Rolling walker (2 wheels) Transfers: Sit to/from Stand Sit to Stand: Contact guard assist           General transfer comment: Cues for hand placement. CGA for safety as pt reported dizziness standing.    Ambulation/Gait Ambulation/Gait assistance: Min assist Gait Distance (Feet): 300  Feet Assistive device: Rolling walker (2 wheels) Gait Pattern/deviations: Drifts right/left, Staggering left, Narrow base of support, Decreased stride length, Step-through pattern Gait velocity: decreased     General Gait Details: min A for obstacle navigation with RW.   Stairs Stairs: Yes Stairs assistance: Contact guard assist Stair Management: One rail Right, Step to pattern, Forwards Number of Stairs: 6 General stair comments: no LOB noted. Very slow and cautious   Wheelchair Mobility     Tilt Bed    Modified Rankin (Stroke Patients Only)       Balance Overall balance assessment: Needs assistance Sitting-balance support: No upper extremity supported, Feet supported Sitting balance-Leahy Scale: Good     Standing balance support: During functional activity, Bilateral upper extremity supported Standing balance-Leahy Scale: Poor Standing balance comment: reliant on external support                            Communication Communication Communication: Impaired Factors Affecting Communication: Difficulty expressing self;Reduced clarity of speech  Cognition Arousal: Alert Behavior During Therapy: WFL for tasks assessed/performed                             Following commands: Impaired Following commands impaired:  (Affected by impaired communication)    Cueing Cueing Techniques: Verbal cues, Tactile cues  Exercises      General Comments General comments (skin integrity, edema, etc.): VSS      Pertinent Vitals/Pain Pain Assessment Pain Assessment: No/denies pain    Home Living  Prior Function            PT Goals (current goals can now be found in the care plan section) Progress towards PT goals: Progressing toward goals    Frequency    Min 2X/week      PT Plan      Co-evaluation              AM-PAC PT "6 Clicks" Mobility   Outcome Measure  Help needed turning from your back  to your side while in a flat bed without using bedrails?: None Help needed moving from lying on your back to sitting on the side of a flat bed without using bedrails?: A Little Help needed moving to and from a bed to a chair (including a wheelchair)?: A Little Help needed standing up from a chair using your arms (e.g., wheelchair or bedside chair)?: A Little Help needed to walk in hospital room?: A Little Help needed climbing 3-5 steps with a railing? : A Little 6 Click Score: 19    End of Session Equipment Utilized During Treatment: Gait belt Activity Tolerance: Patient tolerated treatment well Patient left: in chair;with call bell/phone within reach;with family/visitor present Nurse Communication: Mobility status PT Visit Diagnosis: Other abnormalities of gait and mobility (R26.89);History of falling (Z91.81)     Time: 1100-1118 PT Time Calculation (min) (ACUTE ONLY): 18 min  Charges:    $Gait Training: 8-22 mins PT General Charges $$ ACUTE PT VISIT: 1 Visit                     Shela Nevin, PT, DPT Acute Rehab Services [phone]    Gladys Damme 09/18/2023, 12:08 PM

## 2023-09-19 DIAGNOSIS — R1314 Dysphagia, pharyngoesophageal phase: Secondary | ICD-10-CM | POA: Diagnosis not present

## 2023-09-19 DIAGNOSIS — R42 Dizziness and giddiness: Secondary | ICD-10-CM | POA: Diagnosis not present

## 2023-09-19 DIAGNOSIS — R269 Unspecified abnormalities of gait and mobility: Secondary | ICD-10-CM | POA: Diagnosis not present

## 2023-09-19 DIAGNOSIS — I639 Cerebral infarction, unspecified: Secondary | ICD-10-CM | POA: Diagnosis not present

## 2023-09-19 LAB — GLUCOSE, CAPILLARY
Glucose-Capillary: 108 mg/dL — ABNORMAL HIGH (ref 70–99)
Glucose-Capillary: 196 mg/dL — ABNORMAL HIGH (ref 70–99)
Glucose-Capillary: 128 mg/dL — ABNORMAL HIGH (ref 70–99)
Glucose-Capillary: 112 mg/dL — ABNORMAL HIGH (ref 70–99)

## 2023-09-19 MED ORDER — AMLODIPINE BESYLATE 10 MG PO TABS: 10.0000 mg | ORAL_TABLET | Freq: Every day | ORAL | Status: DC

## 2023-09-19 MED ORDER — ACETAMINOPHEN 325 MG PO TABS: 650.0000 mg | ORAL_TABLET | Freq: Four times a day (QID) | ORAL | Status: DC | PRN

## 2023-09-19 MED ORDER — ENSURE ENLIVE PO LIQD: 237.0000 mL | Freq: Two times a day (BID) | ORAL | Status: DC

## 2023-09-19 MED ORDER — LAMOTRIGINE 25 MG PO TABS: ORAL_TABLET | ORAL | Status: DC

## 2023-09-19 MED ORDER — LOSARTAN POTASSIUM 50 MG PO TABS: 50.0000 mg | ORAL_TABLET | Freq: Every day | ORAL | Status: DC

## 2023-09-19 MED ORDER — APIXABAN 5 MG PO TABS: 5.0000 mg | ORAL_TABLET | Freq: Two times a day (BID) | ORAL | Status: DC

## 2023-09-19 MED ORDER — CYANOCOBALAMIN 1000 MCG PO TABS: 1000.0000 ug | ORAL_TABLET | Freq: Every day | ORAL | Status: DC

## 2023-09-19 NOTE — Plan of Care (Signed)
   Problem: Activity: Goal: Risk for activity intolerance will decrease Outcome: Progressing   Problem: Nutrition: Goal: Adequate nutrition will be maintained Outcome: Progressing   Problem: Coping: Goal: Level of anxiety will decrease Outcome: Progressing

## 2023-09-19 NOTE — Progress Notes (Addendum)
 PROGRESS NOTE  Alan Wise WJX:[id number] DOB: 12-07-1970   PCP: Lorre Munroe, NP  Patient is from: Home.  Lives with his wife.  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  Reportedly unable to tolerate various trials of food consistencies per wife.  He had a televisit with neuro-oncology, Dr. Barbaraann Cao who recommended coming to ED for long-term EEG and CVA workup.   In ED, stable vitals.  CMP within normal except for mild hyperglycemia to 139.  CBC with differential without significant finding.  MRI brain ordered.  Neurology consulted.  Started on IV fluid.  MRI brain without contrast showed new punctate focus of restricted diffusion in the anterior insular cortex compatible with a new acute/subacute infarct, stable 8 mm acute/subacute infarct involving the right cerebral peduncle, stable restricted diffusion in the anterior right frontal lobe and along the atrium of the right lateral ventricle consistent with treated tumor, improved diffusion changes within the genu of the corpus callosum suggesting these were ischemic, stable chronic encephalomalacia of the right frontal operculum, and stable periventricular T2 hyperintensities in the left Hemisphere likely reflects the sequela of chronic microvascular ischemia.  Neurology/neuro-oncology started Eliquis and adjusted AEDs.  In regards to dysphagia, evaluated by SLP and GI and felt to be neurologic.    Therapy  recommended CIR. waiting on bed.  Subjective: Seen and examined earlier this morning.  No major events overnight of this morning.  Patient's wife reports good sleep last night.  No further episodes or spells.  Objective: Vitals:   09/18/23 2350 09/19/23 0326 09/19/23 0729 09/19/23 1200  BP: (!) 140/77 (!) 145/86 138/82 (!) 164/88  Pulse: 61 66 60 63  Resp: 18 18 17    Temp: 98.1 F (36.7 C) 98.4 F (36.9 C) 97.7 F (36.5 C) 98.4 F (36.9 C)  TempSrc: Oral Oral Oral Oral  SpO2: 95% 96% 97% 100%  Weight:      Height:        Examination:  GENERAL: No apparent distress.  Nontoxic. HEENT: MMM.  Vision and hearing grossly intact.  NECK: Supple.  No apparent JVD.  RESP:  No IWOB.  Fair aeration bilaterally. CVS:  RRR. Heart sounds normal.  ABD/GI/GU: BS+. Abd soft, NTND.  MSK/EXT:  Moves extremities. No apparent deformity. No edema.  SKIN: no apparent skin lesion or wound NEURO: Awake and alert and oriented fairly.  Expressive aphasia.  Seems to have receptive aphasia as well.  Left pronator drift.  Finger-to-nose off on the left.  Left homonymous hemianopsia PSYCH: Calm. Normal affect.   Consultants:  Neurology-signed off Neuro-oncology-signed off  Procedures: EEG  Microbiology summarized: None  Assessment and plan: Acute CVA/confusion: Presents with increased gait difficulty, left-sided weakness, confusion and new onset dysphagia.  MRI findings as above but the stroke is not big enough to explain his symptoms fully..  Recent MRA head and neck and TTE without significant finding.  TTE was attempted last hospitalization.  He was discharged on aspirin and Lipitor.  LDL was 156.  A1c 5.4. -Neuro-oncology and neurology-started Eliquis and stopped aspirin -Continue statin. -PT/OT  recommended CIR. CIR following.  Seizure disorder: LTM EEG suggested cortical dysfunction arising from the right hemisphere, maximal right temporal region likely secondary to underlying structural  abnormality but no seizure or epileptiform discharge.  He is on Lamictal.  Compliant.  Lamictal level normal.  No further episodes since the morning of 3/26. -Neurology increase night dose Lamictal to 200 mg.  Continue 150 mg in the morning -Continue seizure precaution -Neurology signed off  Dysphagia: Per wife progressive difficulty ingesting solids or liquids despite various consistencies being trialed.  Overall she states it appears he is having more difficulty swallowing -Evaluated by GI.  Felt to be neurologic. -Upgraded to regular diet by SLP.   Oligodendroglioma S/p radiation with radiation induced brain necrosis: Patient's wife reports left homonymous hemianopsia for over 3 months that they attributed to radiation all lower his radiation is over 2 years ago.  No MRI finding to suggest occipital abnormality. -Defer to neurology oncology  NIDDM-2 with hyperglycemia and hyperlipidemia: A1c 5.4% (was 6.3% on 2/26).  Does not seem to be on medications at home -Discontinue CBG monitoring and SSI. -Continue statin  Confusion/altered mental status: Continues to have intermittent confusion and spells.  B12 normal at 251.  Delirium? -Reorientation and delirium precautions -Received B12 injection 1000 mcg on 3/26 and 27 -Continue B12 p.o.  Gait disorder: Could be due to CVA and/or dehydration.  Feels better after IV fluid hydration. -PT/OT commended home health but wife is interested in inpatient rehab.  Will notify therapy -B12 as above.   Essential hypertension: BP improved. -Increased amlodipine to 10 mg daily on 3/23 -Continue home Inderal 20 mg twice daily -Increase losartan to 50 mg daily -Continue monitoring  Body mass index is 22.53 kg/m.          DVT prophylaxis:   apixaban (ELIQUIS) tablet 5 mg  Code Status: Full code Family Communication: Updated patient's wife at bedside Level of care: Telemetry Medical Status is: Inpatient Remains inpatient appropriate because:  Safe disposition.   Final disposition: CIR   35 minutes with more than 50% spent in reviewing records, counseling patient/family and coordinating care.   Sch Meds:  Scheduled Meds:  amLODipine  10 mg Oral Daily   apixaban  5 mg Oral BID   atorvastatin  40 mg Oral Daily   vitamin B-12  1,000 mcg Oral Daily   feeding supplement  237 mL Oral BID BM   insulin aspart  0-5 Units Subcutaneous QHS   insulin aspart  0-9 Units Subcutaneous TID WC   lamoTRIgine  150 mg Oral q morning   And   lamoTRIgine  200 mg Oral QHS   losartan  25 mg Oral Daily   propranolol  20 mg Oral BID   sodium chloride flush  3 mL Intravenous Q12H   Continuous Infusions:  lacosamide (VIMPAT) IV     PRN Meds:.acetaminophen **OR** acetaminophen, lacosamide (VIMPAT) IV  Antimicrobials: Anti-infectives (From admission, onward)    None        I have personally reviewed the following labs and images: CBC: Recent Labs  Lab 09/14/23 1039 09/16/23 0531  WBC 10.2 8.0  NEUTROABS 7.6  --   HGB 15.4 15.6  HCT 44.7 43.6  MCV 85.1 83.5  PLT 208 195   BMP &GFR Recent Labs  Lab 09/14/23 1039 09/16/23 0531  NA 138 140  K 4.2 3.9  CL 102 102  CO2 30 27  GLUCOSE 139* 116*  BUN 15 12  CREATININE 1.14 0.99  CALCIUM 9.2  9.3  MG  --  2.0  PHOS  --  3.6   Estimated Creatinine Clearance: 83.9 mL/min (by C-G formula based on SCr of 0.99 mg/dL). Liver & Pancreas: Recent Labs  Lab 09/14/23 1039 09/16/23 0531  AST 20 20  ALT 22 24  ALKPHOS 70 70  BILITOT 0.8 0.7  PROT 6.8 6.5  ALBUMIN 4.1 3.8   No results for input(s): "LIPASE", "AMYLASE" in the last 168 hours. No results for input(s): "AMMONIA" in the last 168 hours. Diabetic: No results for input(s): "HGBA1C" in the last 72 hours.  Recent Labs  Lab 09/18/23 1218 09/18/23 1625 09/18/23 2110 09/19/23 0606 09/19/23 1202  GLUCAP 118* 164* 94 108* 196*   Cardiac Enzymes: Recent Labs  Lab 09/16/23 0531  CKTOTAL 37*   No results for  input(s): "PROBNP" in the last 8760 hours. Coagulation Profile: No results for input(s): "INR", "PROTIME" in the last 168 hours. Thyroid Function Tests: No results for input(s): "TSH", "T4TOTAL", "FREET4", "T3FREE", "THYROIDAB" in the last 72 hours. Lipid Profile: No results for input(s): "CHOL", "HDL", "LDLCALC", "TRIG", "CHOLHDL", "LDLDIRECT" in the last 72 hours. Anemia Panel: No results for input(s): "VITAMINB12", "FOLATE", "FERRITIN", "TIBC", "IRON", "RETICCTPCT" in the last 72 hours.  Urine analysis:    Component Value Date/Time   COLORURINE YELLOW (A) 01/24/2016 2152   APPEARANCEUR Cloudy (A) 02/20/2016 1441   LABSPEC 1.010 01/24/2016 2152   LABSPEC 1.026 05/04/2012 2052   PHURINE 6.0 01/24/2016 2152   GLUCOSEU 3+ (A) 02/20/2016 1441   GLUCOSEU Negative 05/04/2012 2052   HGBUR 3+ (A) 01/24/2016 2152   BILIRUBINUR Negative 02/20/2016 1441   BILIRUBINUR Negative 05/04/2012 2052   KETONESUR NEGATIVE 01/24/2016 2152   PROTEINUR 100 (A) 09/07/2023 1218   NITRITE Negative 02/20/2016 1441   NITRITE NEGATIVE 01/24/2016 2152   LEUKOCYTESUR Negative 02/20/2016 1441   LEUKOCYTESUR Negative 05/04/2012 2052   Sepsis Labs: Invalid input(s): "PROCALCITONIN", "LACTICIDVEN"  Microbiology: No results found for this or any previous visit (from the past 240 hours).  Radiology Studies: No results found.     Almond Fitzgibbon T. Addisyn Leclaire [facility]  If 7PM-7AM, please contact night-coverage [email or web address] 09/19/2023, 3:47 PM

## 2023-09-20 ENCOUNTER — Encounter (HOSPITAL_COMMUNITY): Payer: Self-pay | Admitting: Physical Medicine and Rehabilitation

## 2023-09-20 ENCOUNTER — Other Ambulatory Visit: Payer: Self-pay

## 2023-09-20 ENCOUNTER — Other Ambulatory Visit: Payer: Self-pay | Admitting: Internal Medicine

## 2023-09-20 ENCOUNTER — Inpatient Hospital Stay (HOSPITAL_COMMUNITY)
Admission: RE | Admit: 2023-09-20 | Discharge: 2023-09-25 | DRG: 057 | Disposition: A | Discharge status: Home / Self Care | Source: Intra-hospital | Attending: Physical Medicine and Rehabilitation | Admitting: Physical Medicine and Rehabilitation

## 2023-09-20 ENCOUNTER — Encounter: Payer: Self-pay | Admitting: Internal Medicine

## 2023-09-20 DIAGNOSIS — Z8249 Family history of ischemic heart disease and other diseases of the circulatory system: Secondary | ICD-10-CM | POA: Diagnosis not present

## 2023-09-20 DIAGNOSIS — Z85828 Personal history of other malignant neoplasm of skin: Secondary | ICD-10-CM

## 2023-09-20 DIAGNOSIS — Z91148 Patient's other noncompliance with medication regimen for other reason: Secondary | ICD-10-CM

## 2023-09-20 DIAGNOSIS — I69391 Dysphagia following cerebral infarction: Secondary | ICD-10-CM

## 2023-09-20 DIAGNOSIS — Z5329 Procedure and treatment not carried out because of patient's decision for other reasons: Secondary | ICD-10-CM | POA: Diagnosis present

## 2023-09-20 DIAGNOSIS — Z7189 Other specified counseling: Secondary | ICD-10-CM | POA: Diagnosis not present

## 2023-09-20 DIAGNOSIS — R4701 Aphasia: Secondary | ICD-10-CM | POA: Diagnosis present

## 2023-09-20 DIAGNOSIS — K5900 Constipation, unspecified: Secondary | ICD-10-CM | POA: Diagnosis not present

## 2023-09-20 DIAGNOSIS — R1314 Dysphagia, pharyngoesophageal phase: Secondary | ICD-10-CM | POA: Diagnosis not present

## 2023-09-20 DIAGNOSIS — Z923 Personal history of irradiation: Secondary | ICD-10-CM | POA: Diagnosis not present

## 2023-09-20 DIAGNOSIS — Z79899 Other long term (current) drug therapy: Secondary | ICD-10-CM

## 2023-09-20 DIAGNOSIS — Z833 Family history of diabetes mellitus: Secondary | ICD-10-CM | POA: Diagnosis not present

## 2023-09-20 DIAGNOSIS — I639 Cerebral infarction, unspecified: Secondary | ICD-10-CM | POA: Diagnosis not present

## 2023-09-20 DIAGNOSIS — G40909 Epilepsy, unspecified, not intractable, without status epilepticus: Secondary | ICD-10-CM | POA: Diagnosis present

## 2023-09-20 DIAGNOSIS — Z823 Family history of stroke: Secondary | ICD-10-CM

## 2023-09-20 DIAGNOSIS — E1165 Type 2 diabetes mellitus with hyperglycemia: Secondary | ICD-10-CM | POA: Diagnosis present

## 2023-09-20 DIAGNOSIS — Z91041 Radiographic dye allergy status: Secondary | ICD-10-CM

## 2023-09-20 DIAGNOSIS — T66XXXD Radiation sickness, unspecified, subsequent encounter: Secondary | ICD-10-CM

## 2023-09-20 DIAGNOSIS — D496 Neoplasm of unspecified behavior of brain: Secondary | ICD-10-CM | POA: Diagnosis not present

## 2023-09-20 DIAGNOSIS — I6300 Cerebral infarction due to thrombosis of unspecified precerebral artery: Principal | ICD-10-CM

## 2023-09-20 DIAGNOSIS — Z7901 Long term (current) use of anticoagulants: Secondary | ICD-10-CM

## 2023-09-20 DIAGNOSIS — I69320 Aphasia following cerebral infarction: Secondary | ICD-10-CM | POA: Diagnosis not present

## 2023-09-20 DIAGNOSIS — I10 Essential (primary) hypertension: Secondary | ICD-10-CM | POA: Diagnosis present

## 2023-09-20 DIAGNOSIS — E785 Hyperlipidemia, unspecified: Secondary | ICD-10-CM | POA: Diagnosis present

## 2023-09-20 DIAGNOSIS — Z85841 Personal history of malignant neoplasm of brain: Secondary | ICD-10-CM

## 2023-09-20 DIAGNOSIS — Z515 Encounter for palliative care: Secondary | ICD-10-CM | POA: Diagnosis not present

## 2023-09-20 DIAGNOSIS — I69354 Hemiplegia and hemiparesis following cerebral infarction affecting left non-dominant side: Principal | ICD-10-CM

## 2023-09-20 LAB — GLUCOSE, CAPILLARY
Glucose-Capillary: 120 mg/dL — ABNORMAL HIGH (ref 70–99)
Glucose-Capillary: 94 mg/dL (ref 70–99)
Glucose-Capillary: 127 mg/dL — ABNORMAL HIGH (ref 70–99)

## 2023-09-20 MED ORDER — APIXABAN 5 MG PO TABS
5.0000 mg | ORAL_TABLET | Freq: Two times a day (BID) | ORAL | Status: DC
  Filled 2023-09-20 (×3): qty 1

## 2023-09-20 MED ORDER — PROPRANOLOL HCL 20 MG PO TABS
20.0000 mg | ORAL_TABLET | Freq: Two times a day (BID) | ORAL | Status: DC
  Administered 2023-09-24 – 2023-09-25 (×3): 20 mg via ORAL
  Filled 2023-09-20 (×4): qty 1

## 2023-09-20 MED ORDER — POLYETHYLENE GLYCOL 3350 17 G PO PACK: 17.0000 g | PACK | Freq: Every day | ORAL | Status: DC | PRN

## 2023-09-20 MED ORDER — ENSURE ENLIVE PO LIQD: 237.0000 mL | Freq: Two times a day (BID) | ORAL | Status: DC

## 2023-09-20 MED ORDER — ALUM & MAG HYDROXIDE-SIMETH 200-200-20 MG/5ML PO SUSP: 30.0000 mL | ORAL | Status: DC | PRN

## 2023-09-20 MED ORDER — GUAIFENESIN-DM 100-10 MG/5ML PO SYRP: 5.0000 mL | ORAL_SOLUTION | Freq: Four times a day (QID) | ORAL | Status: DC | PRN

## 2023-09-20 MED ORDER — MELATONIN 3 MG PO TABS: 3.0000 mg | ORAL_TABLET | Freq: Every evening | ORAL | Status: DC | PRN

## 2023-09-20 MED ORDER — FLEET ENEMA RE ENEM: 1.0000 | ENEMA | Freq: Once | RECTAL | Status: DC | PRN

## 2023-09-20 MED ORDER — LAMOTRIGINE 100 MG PO TABS
200.0000 mg | ORAL_TABLET | Freq: Every day | ORAL | Status: DC
  Administered 2023-09-22 – 2023-09-24 (×3): 200 mg via ORAL
  Filled 2023-09-20 (×4): qty 2

## 2023-09-20 MED ORDER — ATORVASTATIN CALCIUM 40 MG PO TABS
40.0000 mg | ORAL_TABLET | Freq: Every day | ORAL | Status: DC
Start: 2023-09-21 — End: 2023-09-25
  Filled 2023-09-20 (×2): qty 1

## 2023-09-20 MED ORDER — ACETAMINOPHEN 325 MG PO TABS: 325.0000 mg | ORAL_TABLET | ORAL | Status: DC | PRN

## 2023-09-20 MED ORDER — LORAZEPAM 2 MG/ML IJ SOLN: 1.0000 mg | INTRAMUSCULAR | Status: DC | PRN

## 2023-09-20 MED ORDER — AMLODIPINE BESYLATE 10 MG PO TABS: 10.0000 mg | ORAL_TABLET | Freq: Every day | ORAL | Status: DC

## 2023-09-20 MED ORDER — INSULIN ASPART 100 UNIT/ML IJ SOLN: 0.0000 [IU] | Freq: Every day | INTRAMUSCULAR | Status: DC

## 2023-09-20 MED ORDER — INSULIN ASPART 100 UNIT/ML IJ SOLN: 0.0000 [IU] | Freq: Three times a day (TID) | INTRAMUSCULAR | Status: DC

## 2023-09-20 MED ORDER — VITAMIN D 25 MCG (1000 UNIT) PO TABS
1000.0000 [IU] | ORAL_TABLET | Freq: Every day | ORAL | Status: DC
  Administered 2023-09-20: 1000 [IU] via ORAL
  Filled 2023-09-20 (×3): qty 1

## 2023-09-20 MED ORDER — LAMOTRIGINE 100 MG PO TABS
150.0000 mg | ORAL_TABLET | Freq: Every morning | ORAL | Status: DC
Start: 2023-09-20 — End: 2023-09-25
  Administered 2023-09-22 – 2023-09-25 (×4): 150 mg via ORAL
  Filled 2023-09-20 (×4): qty 2

## 2023-09-20 MED ORDER — LOSARTAN POTASSIUM 25 MG PO TABS
25.0000 mg | ORAL_TABLET | Freq: Every day | ORAL | Status: DC
  Administered 2023-09-24: 25 mg via ORAL
  Filled 2023-09-20 (×2): qty 1

## 2023-09-20 MED ORDER — BISACODYL 10 MG RE SUPP: 10.0000 mg | Freq: Every day | RECTAL | Status: DC | PRN

## 2023-09-20 MED ORDER — ACETAMINOPHEN 325 MG PO TABS: 650.0000 mg | ORAL_TABLET | Freq: Four times a day (QID) | ORAL | Status: DC | PRN

## 2023-09-20 MED ORDER — VITAMIN B-12 1000 MCG PO TABS
1000.0000 ug | ORAL_TABLET | Freq: Every day | ORAL | Status: DC
  Filled 2023-09-20 (×2): qty 1

## 2023-09-20 NOTE — Progress Notes (Signed)
 Inpatient Rehab Admissions Coordinator:    Pt. Will transfer to CIR today. RN may call report to [phone]  Pt. To admit to CIR for estimated 12-14 days with the goal of discharging home with his wife.   Megan Salon, MS, CCC-SLP Rehab Admissions Coordinator  [phone] (celll) [phone] (office)

## 2023-09-20 NOTE — Plan of Care (Signed)

## 2023-09-20 NOTE — H&P (Signed)
 Physical Medicine and Rehabilitation Admission H&P    CC: Difficulty speaking HPI:  Alan Wise is a 53 y.o. year old male  who  has a past medical history of Allergy, Complication of anesthesia, Diabetes mellitus (HCC), GERD (gastroesophageal reflux disease), Headache, History of kidney stones, Hyperlipidemia, and Renal disorder.  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  They presented to the hospital 09/14/23 with worsening gait, left-sided weakness, and new onset dysphagia.  Of note, he was recently hospitalized at home announce from 3-19 to 3-21 with new CVA and breakthrough seizures.  In the ER, he was started on IV fluids and MRI brain showed new punctate focus of restricted diffusion in the anterior insular cortex compatible with a new acute/subacute infarct, stable 8 mm acute/subacute infarct involving the right cerebral peduncle, stable restricted diffusion in the anterior right frontal lobe and along the atrium of the right lateral ventricle consistent with treated tumor, improved diffusion changes within the genu of the corpus callosum suggesting these were ischemic, stable chronic encephalomalacia of the right frontal operculum, and stable periventricular T2 hyperintensities in the left Hemisphere likely reflects the sequela of chronic microvascular ischemia.     He was started on Eliquis, AEDs were adjusted, and admitted with evaluation by neurology and Dr. Barbaraann Cao with neuro-oncology, cause of recent decline determined to be multifactorial secondary to radiation necrosis, breakthrough seizures, and multiple recent strokes.  Hospitalization was complicated by dysphagia, which GI determined to be neurologic.  Last known seizure was 3-26, with subsequent increasing nightly Lamictal.  He was determined stable by the primary team and admitted to inpatient rehab 3-30.  ROS unable to determine due to expressive aphasia Past Medical History:  Diagnosis Date    Allergy    Complication of anesthesia    pt reports he is starting to have more difficulty arousing after surgery   Diabetes mellitus (HCC)    GERD (gastroesophageal reflux disease)    Headache    History of kidney stones    Hyperlipidemia    Renal disorder    kidney stones   Past Surgical History:  Procedure Laterality Date   APPLICATION OF CRANIAL NAVIGATION Right 01/17/2021   Procedure: APPLICATION OF CRANIAL NAVIGATION;  Surgeon: Bedelia Person, MD;  Location: [facility] OR;  Service: Neurosurgery;  Laterality: Right;   COLONOSCOPY  09/03/1994   COLONOSCOPY WITH PROPOFOL N/A 01/07/2023   Procedure: COLONOSCOPY WITH PROPOFOL;  Surgeon: [name], MD;  Location: [facility] ENDOSCOPY;  Service: Endoscopy;  Laterality: N/A;  NOT TOO EARLY   CYSTOSCOPY WITH STENT PLACEMENT Right 01/25/2016   Procedure: CYSTOSCOPY WITH STENT PLACEMENT;  Surgeon: Hildred Laser, MD;  Location: [facility] ORS;  Service: Urology;  Laterality: Right;   FRAMELESS  BIOPSY WITH BRAINLAB Right 01/17/2021   Procedure: RIGHT STEREOTACTIC BIOPSY OF INSULAR LESION;  Surgeon: Bedelia Person, MD;  Location: [facility] OR;  Service: Neurosurgery;  Laterality: Right;   KNEE ARTHROSCOPY W/ MENISCAL REPAIR Right 06/23/1988   POLYPECTOMY  01/07/2023   Procedure: POLYPECTOMY;  Surgeon: [name], MD;  Location: [facility] ENDOSCOPY;  Service: Endoscopy;;   removal of birthmark  06/08/2013   SKIN CANCER EXCISION  06/23/2012   TYMPANOSTOMY TUBE PLACEMENT  08/06/1978   URETEROSCOPY WITH HOLMIUM LASER LITHOTRIPSY Right 01/25/2016   Procedure: URETEROSCOPY WITH HOLMIUM LASER LITHOTRIPSY;  Surgeon: Hildred Laser, MD;  Location: [facility] ORS;  Service: Urology;  Laterality: Right;   URETHRAL STRICTURE DILATATION     visual inspection of vocal cord  1973   WISDOM TOOTH EXTRACTION     Family History  Problem Relation Age of Onset   Hyperlipidemia Father    Hypertension Father    Diabetes Father    Benign prostatic hyperplasia Father     Stroke Maternal Grandmother    Diabetes Maternal Grandmother    Stroke Paternal Grandmother    Hypertension Paternal Grandmother    Kidney disease Neg Hx    Prostate cancer Neg Hx    Social History:  reports that he has never smoked. He has never used smokeless tobacco. He reports that he does not currently use alcohol. He reports that he does not use drugs. Allergies:  Allergies  Allergen Reactions   Contrast Media [Iodinated Contrast Media] Swelling   No medications prior to admission.      Home Assistive Devices / Equipment Home Equipment: Shower seat - built in, Hand held shower head (no shower seat built in downstairs)   Prior Device Use: Indicate devices/aids used by the patient prior to current illness, exacerbation or injury? None of the above   Current Functional Level Cognition   Orientation Level: Oriented X4    Extremity Assessment (includes Sensation/Coordination)   Upper Extremity Assessment: LUE deficits/detail LUE Deficits / Details: decreased grip strength, able to flex L shoulder to ~100 degrees with reports pain, drift observed with flexion. Pt denies impairments in sensation LUE:  (incoordination; frequently dropping items; poor in-hand manipulation skills; attempting ot use functionally; shoulder/elbow/wrist ROM overall WFL for ROM; difficulty opposing thumb to each finger) LUE Sensation: WNL LUE Coordination: decreased fine motor  Lower Extremity Assessment: Defer to PT evaluation     ADLs   Overall ADL's : Needs assistance/impaired Eating/Feeding: Modified independent Grooming: Supervision/safety, Set up, Sitting Upper Body Bathing: Minimal assistance, Sitting Lower Body Bathing: Moderate assistance Upper Body Dressing : Minimal assistance Lower Body Dressing: Moderate assistance Toilet Transfer: Minimal assistance, Ambulation Toileting- Clothing Manipulation and Hygiene: Minimal assistance Tub/ Shower Transfer: Minimal assistance Functional  mobility during ADLs: Minimal assistance General ADL Comments: Pt has L eye visual deficits requiring verbal cues for sequencing and to attend to L side during functional mobility. Pt with FM impairments in L hand but was able to use functionally to complete grooming task. CGA provided for safety during functional mobility without use of AD     Mobility   Overal bed mobility: Needs Assistance Bed Mobility: Supine to Sit, Sit to Supine Supine to sit: Supervision, HOB elevated Sit to supine: Supervision, HOB elevated, Contact guard assist General bed mobility comments: Pt completed task with verbal cues to continue scooting foward until B feet were flat on the floor.     Transfers   Overall transfer level: Needs assistance Equipment used: None Transfers: Sit to/from Stand Sit to Stand: Min assist General transfer comment: HHA     Ambulation / Gait / Stairs / Psychologist, prison and probation services   Ambulation/Gait Ambulation/Gait assistance: +2 physical assistance, Min assist, +2 safety/equipment Gait Distance (Feet): 175 Feet (145+30) Assistive device: 2 person hand held assist, Rolling walker (2 wheels) Gait Pattern/deviations: Drifts right/left, Staggering left, Narrow base of support, Decreased stride length, Step-through pattern General Gait Details: Pt initially able to ambulate to gym with Min A via 2 person HHA. pt noted to be rather unsteady drifting to the L and reliant on external support. Trialed RW and pt still required Min A due to visual deficits for obstacle navigation. Gait velocity: decreased Stairs: Yes Stairs assistance: Min assist Stair Management: One rail Right, Step to pattern,  Forwards Number of Stairs: 4 General stair comments: no LOB noted. Very slow and cautious     Posture / Balance Dynamic Sitting Balance Sitting balance - Comments: static sitting EOB and able to weight, bend/reach to donn socks onto B feet while seated in bed Balance Overall balance assessment: Needs  assistance Sitting-balance support: No upper extremity supported, Feet supported Sitting balance-Leahy Scale: Good Sitting balance - Comments: static sitting EOB and able to weight, bend/reach to donn socks onto B feet while seated in bed Standing balance support: During functional activity, Bilateral upper extremity supported Standing balance-Leahy Scale: Poor Standing balance comment: reliant on external support     Special needs/care consideration Diabetic management h/o DM on insulin in acute hospital and Special service needs none    Previous Home Environment (from acute therapy documentation) Living Arrangements: Spouse/significant other, Children (5 and 10 y.o.) Available Help at Discharge: Family, Available 24 hours/day (wife available 24/7 and mother lives close by. good family and friend support) Type of Home: House Home Layout: Two level, Bed/bath upstairs (can live on main level if needed) Alternate Level Stairs-Rails:  (L for first 5 and R on second 5) Alternate Level Stairs-Number of Steps: flight of steps to 2nd floor bedroom Home Access: Stairs to enter (stone steps) Entrance Stairs-Rails: Right, Left Entrance Stairs-Number of Steps: 4 Bathroom Shower/Tub: Health visitor: Standard Home Care Services: No   Discharge Living Setting Plans for Discharge Living Setting: House, Lives with (comment) (Lives with wife, 10 yo dtr and 5 yo son, 2 dogs, 2 cats) Type of Home at Discharge: House Discharge Home Layout: Two level, Bed/bath upstairs, Able to live on main level with bedroom/bathroom, Full bath on main level Alternate Level Stairs-Rails: Right, Left Alternate Level Stairs-Number of Steps: about 15 steps to upstairs level Discharge Home Access: Stairs to enter Entrance Stairs-Rails: Right, Left Entrance Stairs-Number of Steps: 4 Discharge Bathroom Shower/Tub: Walk-in shower, Door Discharge Bathroom Toilet: Standard Discharge Bathroom Accessibility:  Yes How Accessible: Accessible via walker Does the patient have any problems obtaining your medications?: No  Physical Exam: Vitals reviewed. Constitutional: No apparent distress. Appropriate appearance for age.  HENT: No JVD. Neck Supple. Trachea midline. Atraumatic, normocephalic. Eyes: PERRLA. EOMI. Visual fields grossly intact.  Cardiovascular: RRR, no murmurs/rub/gallops. No Edema. Peripheral pulses 2+  Respiratory: CTAB. No rales, rhonchi, or wheezing. On RA.  Abdomen: + bowel sounds, normoactive. No distention or tenderness.  Skin: C/D/I. No apparent lesions. MSK:      No apparent deformity.  Full active range of motion all 4 extremities  Neurologic exam:  Cognition: AAO to person, partially to time, and place with cues.  Can follow 2 of 5 simple commands.  Global aphasia with greater expressive component.   Language: Expressive aphasia, no dysarthria, no repetition. Insight: Poor insight into current condition.  Mood: Pleasant affect, appropriate mood.  Sensation: Intact to light touch in BL UE and Les  Reflexes: Negative Hoffman's and babinski signs bilaterally.  CN: Left facial droop.  Left tongue deviation.  Left homonymous hemianopsia. Coordination: Left greater than right upper extremity ataxia. Spasticity: MAS 0 in all extremities.    Results for orders placed or performed during the hospital encounter of 09/14/23 (from the past 48 hours)  Glucose, capillary     Status: Abnormal   Collection Time: 09/18/23  4:25 PM  Result Value Ref Range   Glucose-Capillary 164 (H) 70 - 99 mg/dL    Comment: Glucose reference range applies only to samples taken after fasting  for at least 8 hours.   Comment 1 Notify RN   Glucose, capillary     Status: None   Collection Time: 09/18/23  9:10 PM  Result Value Ref Range   Glucose-Capillary 94 70 - 99 mg/dL    Comment: Glucose reference range applies only to samples taken after fasting for at least 8 hours.   Comment 1 Notify RN     Comment 2 Document in Chart   Glucose, capillary     Status: Abnormal   Collection Time: 09/19/23  6:06 AM  Result Value Ref Range   Glucose-Capillary 108 (H) 70 - 99 mg/dL    Comment: Glucose reference range applies only to samples taken after fasting for at least 8 hours.   Comment 1 Notify RN    Comment 2 Document in Chart   Glucose, capillary     Status: Abnormal   Collection Time: 09/19/23 12:02 PM  Result Value Ref Range   Glucose-Capillary 196 (H) 70 - 99 mg/dL    Comment: Glucose reference range applies only to samples taken after fasting for at least 8 hours.   Comment 1 Notify RN    Comment 2 Document in Chart   Glucose, capillary     Status: Abnormal   Collection Time: 09/19/23  4:12 PM  Result Value Ref Range   Glucose-Capillary 128 (H) 70 - 99 mg/dL    Comment: Glucose reference range applies only to samples taken after fasting for at least 8 hours.   Comment 1 Notify RN    Comment 2 Document in Chart   Glucose, capillary     Status: Abnormal   Collection Time: 09/19/23  9:16 PM  Result Value Ref Range   Glucose-Capillary 112 (H) 70 - 99 mg/dL    Comment: Glucose reference range applies only to samples taken after fasting for at least 8 hours.   Comment 1 Notify RN    Comment 2 Document in Chart   Glucose, capillary     Status: Abnormal   Collection Time: 09/20/23  6:58 AM  Result Value Ref Range   Glucose-Capillary 120 (H) 70 - 99 mg/dL    Comment: Glucose reference range applies only to samples taken after fasting for at least 8 hours.   Comment 1 Notify RN    Comment 2 Document in Chart    No results found.    There were no vitals taken for this visit.  Medical Problem List and Plan: 1. Functional deficits secondary to multifocal CVAs, complicated by oligodendroma with radiation necrosis  -patient may  shower  -ELOS/Goals: 10 to 12 days, supervision PT/OT/SLP   - stable for IRF admission  2.  Antithrombotics: -DVT/anticoagulation:  Pharmaceutical:  Eliquis 5 mg twice daily  -antiplatelet therapy: n/a  3. Pain Management: As needed Tylenol  4. Mood/Behavior/Sleep: No current medications  -antipsychotic agents: n/a  -Sleep log added  5. Neuropsych/cognition: This patient is not capable of making decisions on his own behalf.  -Reporting ongoing confusion/delirium.  Delirium precautions placed.     6. Skin/Wound Care: Standard precautions and orders.  7. Fluids/Electrolytes/Nutrition: CMET in a.m.  On regular diet.  -On nutritional supplements twice daily with meals  -B12 supplementation 8. Seizure disorder -Seizure precautions added -Last known seizure 3-26 -Lamictal 150 mg every morning, 200 mg every afternoon -As needed IV Ativan 1 to 2 mg for seizures added  9. Dysphagia -On regular diet and intake; pending SLP assessment  10. type 2 diabetes with hyperglycemia.  SSI 3  times daily AC.  11. Hypertension.  Continue amlodipine 10 mg daily, losartan 25 mg daily, propranolol 20 mg twice daily.  Angelina Sheriff, DO 09/20/2023

## 2023-09-20 NOTE — Discharge Summary (Signed)
 [facility] Discharge Summary   Patient: Alan Wise UEA:[id number]  PCP: Lorre Munroe, NP  Date of admission: 09/14/2023   Date of discharge:  09/20/2023     Discharge Diagnoses:  Principal Problem:   Dysphagia Active Problems:   Seizure disorder (HCC)   Oligodendroglioma (HCC)   Gait disorder   History of CVA (cerebrovascular accident)   Type 2 diabetes mellitus with hyperglycemia (HCC)   Hyperlipidemia associated with type 2 diabetes mellitus (HCC)   Radiation therapy induced brain necrosis   Essential hypertension   Acute CVA (cerebrovascular accident) (HCC)   Cerebrovascular accident (CVA) (HCC)   Dizziness   Abnormal finding on GI tract imaging   Admitted From: Home Disposition:  Acute Rehab CIR  Recommendations for Outpatient Follow-up:  Follow-up with PCP in 1 week Follow-up with neurology and neuro-oncology in 1 to 2 weeks Follow up LABS/TEST:     Follow-up Information     Lorre Munroe, NP Follow up in 1 week(s).   Specialties: Internal Medicine, Emergency Medicine Contact information: [address] 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  Reportedly unable to tolerate various trials of food consistencies per wife.  He had a  televisit with neuro-oncology, Dr. Barbaraann Cao who recommended coming to ED for long-term EEG and CVA workup.    In ED, stable vitals.  CMP within normal except for mild hyperglycemia to 139.  CBC with differential without significant finding.  MRI brain ordered.  Neurology consulted.  Started on IV fluid.   MRI brain without contrast showed new punctate focus of restricted diffusion in the anterior insular cortex compatible with a new acute/subacute infarct, stable 8 mm acute/subacute infarct involving the right cerebral peduncle, stable restricted diffusion in the anterior right frontal lobe and along the atrium of the right lateral ventricle consistent with treated tumor, improved diffusion changes within the genu of the corpus callosum suggesting these were ischemic, stable chronic encephalomalacia of the right frontal operculum, and stable periventricular T2 hyperintensities in the left Hemisphere likely reflects the sequela of chronic microvascular ischemia.   Neurology/neuro-oncology started Eliquis and adjusted AEDs.  In regards to dysphagia, evaluated by SLP and GI and felt to be neurologic.     Therapy recommended CIR. patient received a bed offer today, stable to be transferred to CIR today.  Assessment and plan:  # Acute CVA/confusion: Presents with increased gait difficulty, left-sided weakness, confusion and new onset dysphagia.  MRI findings as above but the stroke is not big enough to explain his symptoms fully..  Recent MRA head and neck and TTE without significant finding.  TTE was attempted last hospitalization.  He was discharged on aspirin and Lipitor.  LDL was 156.  A1c 5.4. -Neuro-oncology and neurology-started Eliquis and stopped aspirin. Continue statin. -PT/OT recommended CIR. stable to transfer today to CIR.   #  Seizure disorder: LTM EEG suggested cortical dysfunction arising from the right hemisphere, maximal right temporal region likely secondary to underlying structural  abnormality but no seizure or epileptiform discharge.  He is on Lamictal.  Compliant.  Lamictal level normal.  No further episodes since the morning of 3/26. -Neurology increase night dose Lamictal to 200 mg.  Continue 150 mg in the morning -Continue seizure precaution. Neurology signed off   # Dysphagia: Per wife progressive difficulty ingesting solids or liquids despite various consistencies being trialed.  Overall she states it appears he is having more difficulty swallowing -Evaluated by GI.  Felt to be neurologic. -Upgraded to regular diet by SLP.     # Oligodendroglioma S/p radiation with radiation induced brain necrosis: Patient's wife reports left homonymous hemianopsia for over 3 months that they attributed to radiation all lower his radiation is over 2 years ago.  No MRI finding to suggest occipital abnormality. F/u with neurology oncology   # NIDDM-2 with hyperglycemia and hyperlipidemia: A1c 5.4% (was 6.3% on 2/26).  Does not seem to be on medications at home. Continue statin # Confusion/altered mental status: Continues to have intermittent confusion and spells.  B12 normal at 251.  Delirium? Continue Reorientation and delirium precautions S/p Received B12 injection 1000 mcg on 3/26 and 27 -Continue B12 p.o.   # Gait disorder: Could be due to CVA and/or dehydration.  Feels better after IV fluid hydration. PT/OT commended CIR. Continue B12 as above.   # Essential hypertension: BP improved. Increased amlodipine to 10 mg daily on 3/23 Continue home Inderal 20 mg twice daily Increase losartan to 50 mg daily Monitor BP and titrate medications accordingly.   Body mass index is 22.53 kg/m.  Nutrition Interventions:  - Patient was instructed, not to drive, operate heavy machinery, perform activities at heights, swimming or participation in water activities or provide baby sitting services while on Pain, Sleep and Anxiety Medications; until his outpatient Physician has advised to do  so again.  - Also recommended to not to take more than prescribed Pain, Sleep and Anxiety Medications.  Patient was seen by physical therapy, who recommended Therapy, CIR, which was arranged. On the day of the discharge the patient's vitals were stable, and no other acute medical condition were reported by patient. the patient was felt safe to be discharge at CIR, acute rehab for physical therapy.   Consultants:  Neurology-signed off Neuro-oncology-signed off   Procedures: EEG   Discharge Exam: GENERAL: No apparent distress.  Nontoxic. HEENT: MMM.  Vision and hearing grossly intact.  NECK: Supple.  No apparent JVD.  RESP:  No IWOB.  Fair aeration bilaterally. CVS:  RRR. Heart sounds normal.  ABD/GI/GU: BS+. Abd soft, NTND.  MSK/EXT:  Moves extremities. No apparent deformity. No edema.  SKIN: no apparent skin lesion or wound NEURO: Awake and alert and oriented fairly.  Expressive aphasia.  Seems to have receptive aphasia as well.  Left pronator drift.  Finger-to-nose off on the left.  Left homonymous hemianopsia PSYCH: Calm. Normal affect.    Filed Weights   09/14/23 1023  Weight: 67.2 kg   Vitals:   09/20/23 0339 09/20/23 0742  BP: (!) 151/87 (!) 142/84  Pulse: 64 66  Resp: 18 17  Temp: 98.2 F (36.8 C) 98 F (36.7 C)  SpO2: 97% 93%    DISCHARGE MEDICATION: Allergies as of 09/20/2023       Reactions   Contrast Media [iodinated Contrast Media] Swelling        Medication List  STOP taking these medications    aspirin EC 81 MG tablet       TAKE these medications    acetaminophen 325 MG tablet Commonly known as: TYLENOL Take 2 tablets (650 mg total) by mouth every 6 (six) hours as needed for mild pain (pain score 1-3) (or Fever >/= 101).   amLODipine 10 MG tablet Commonly known as: Norvasc Take 1 tablet (10 mg total) by mouth daily. What changed:  medication strength how much to take   apixaban 5 MG Tabs tablet Commonly known as: ELIQUIS Take 1  tablet (5 mg total) by mouth 2 (two) times daily.   atorvastatin 40 MG tablet Commonly known as: Lipitor Take 1 tablet (40 mg total) by mouth daily.   cholecalciferol 25 MCG (1000 UNIT) tablet Commonly known as: VITAMIN D3 Take 1 tablet (1,000 Units total) by mouth daily.   cyanocobalamin 1000 MCG tablet Take 1 tablet (1,000 mcg total) by mouth daily.   feeding supplement Liqd Take 237 mLs by mouth 2 (two) times daily between meals.   lamoTRIgine 25 MG tablet Commonly known as: LAMICTAL Take 6 tablets (150 mg total) by mouth every morning AND 8 tablets (200 mg total) at bedtime. What changed:  medication strength See the new instructions.   losartan 50 MG tablet Commonly known as: COZAAR Take 1 tablet (50 mg total) by mouth daily.   propranolol 20 MG tablet Commonly known as: INDERAL TAKE 1 TABLET BY MOUTH TWICE A DAY   Valtoco 15 MG Dose 7.5 MG/0.1ML Lqpk Generic drug: diazePAM (15 MG Dose) Place 15 mg into the nose once as needed for up to 1 dose (seizure).       Allergies  Allergen Reactions   Contrast Media [Iodinated Contrast Media] Swelling   Discharge Instructions     Call MD for:   Complete by: As directed    Any new neuro symptoms or worsening of current symptoms   Call MD for:  difficulty breathing, headache or visual disturbances   Complete by: As directed    Call MD for:  extreme fatigue   Complete by: As directed    Call MD for:  persistant dizziness or light-headedness   Complete by: As directed    Call MD for:  persistant nausea and vomiting   Complete by: As directed    Call MD for:  severe uncontrolled pain   Complete by: As directed    Call MD for:  temperature >100.4   Complete by: As directed    Diet - low sodium heart healthy   Complete by: As directed    Discharge instructions   Complete by: As directed    Follow-up with PCP in 1 week Follow-up with neurology and neuro-oncology in 1 to 2 weeks   Increase activity slowly   Complete  by: As directed        The results of significant diagnostics from this hospitalization (including imaging, microbiology, ancillary and laboratory) are listed below for reference.    Significant Diagnostic Studies: DG Swallowing Func-Speech Pathology Result Date: 09/16/2023 Table formatting from the original result was not included. Modified Barium Swallow Study Patient Details Name: Alan Wise MRN: [id number] Date of Birth: 07/02/1970 Today's Date: 09/16/2023 HPI/PMH: HPI: 53 yr old presenting 3/24 with difficulty walking and swallowing. MRI brain revealed new punctate focus of restricted diffusion in the anterior insular cortex compatible with new acute/subacute infarct. Recent admission to [facility] ED 3/19-3/21 with AMS and new CVA. PMH includes oligodendroglioma, brain tumor  with necrosis diagnosed 2022 (radiation), h/o CVA with aphasia, DM, seizure disorder. Wife says aphasia initiated a year ago slowely decling but feels he is at his baseline with aphasia and is receiving outpatient ST per previous SLP note. BSE 09/10/23 initial cough with liquids not repeated with additional trials, complete mastication and clearance with regular textures. Recommmende regular/thin Clinical Impression: Clinical Impression: Pt demonstrates pharyngoesophageal and suspected esophageal dysphagia with DIGEST score of 2 (moderate impairment). Pt's pharyngoesophageal segment closes prematurely causing consistent backflow into his pyriform sinuses throughout the study and esophagus appeared to remain open after the swallow on several occasions. He had residue in his proximal esophagus with thicker consistencies in addition to backflow possibly explaining sudden coughing during meals. A Mendelsohn maneuver was attempted to prolong PES opening however he was unable to adequately perform. Material in the esophagus ascended several times with mild amount reaching pharynx. At the end of the study with thin barium there was  penetration to the vocal cords that cleared however when fluoro turned back on aspiration was observed and uncertain if from pyriform sinus residue or from possible reflux. Min-mild pyriform sinus residue consistently present not completely cleared with second swallows. Mastication of regular texture was timely without residual. Recommend upgrade to regular and educated wife to order textures he can masticate easier as he does have decreased sensation on left side oral cavity. Continue thin liquids, meds whole in applesauce, pt prefers cups over straws, alternate liquids and solids and place food on right side of oral cavity. ST will continue to follow and trial exercises to promote PES opening. Pt may benefit from further esophageal assessment with GI consult. Factors that may increase risk of adverse event in presence of aspiration Rubye Oaks & Clearance Coots 2021): No data recorded Recommendations/Plan: Swallowing Evaluation Recommendations Swallowing Evaluation Recommendations Recommendations: PO diet PO Diet Recommendation: Regular; Thin liquids (Level 0) Liquid Administration via: Cup (pt prefers cup over straw) Medication Administration: Whole meds with puree Supervision: Patient able to self-feed; Full supervision/cueing for swallowing strategies Swallowing strategies  : Slow rate; Small bites/sips; Follow solids with liquids; Check for pocketing or oral holding Postural changes: Position pt fully upright for meals; Stay upright 30-60 min after meals Oral care recommendations: Oral care BID (2x/day) Recommended consults: Consider GI consultation; Consider esophageal assessment Treatment Plan Treatment Plan Treatment recommendations: Therapy as outlined in treatment plan below Follow-up recommendations: -- (TBD) Functional status assessment: Patient has had a recent decline in their functional status and demonstrates the ability to make significant improvements in function in a reasonable and predictable amount of  time. Treatment frequency: Min 2x/week Treatment duration: 2 weeks Interventions: Diet toleration management by SLP; Patient/family education; Compensatory techniques; Other (comment) (trial of Mendelsohn and Shaker) Recommendations Recommendations for follow up therapy are one component of a multi-disciplinary discharge planning process, led by the attending physician.  Recommendations may be updated based on patient status, additional functional criteria and insurance authorization. Assessment: Orofacial Exam: Orofacial Exam Oral Cavity: Oral Hygiene: WFL Oral Cavity - Dentition: Adequate natural dentition Orofacial Anatomy: WFL Oral Motor/Sensory Function: Suspected cranial nerve impairment CN VII - Facial: Left motor impairment Anatomy: Anatomy: Prominent cricopharyngeus Boluses Administered: Boluses Administered Boluses Administered: Thin liquids (Level 0); Mildly thick liquids (Level 2, nectar thick); Moderately thick liquids (Level 3, honey thick); Puree; Solid  Oral Impairment Domain: Oral Impairment Domain Lip Closure: No labial escape Tongue control during bolus hold: Cohesive bolus between tongue to palatal seal Bolus preparation/mastication: Timely and efficient chewing and mashing Bolus transport/lingual motion:  Brisk tongue motion Initiation of pharyngeal swallow : Pyriform sinuses  Pharyngeal Impairment Domain: Pharyngeal Impairment Domain Soft palate elevation: No bolus between soft palate (SP)/pharyngeal wall (PW) Laryngeal elevation: Complete superior movement of thyroid cartilage with complete approximation of arytenoids to epiglottic petiole Anterior hyoid excursion: Complete anterior movement Epiglottic movement: Complete inversion Laryngeal vestibule closure: Incomplete, narrow column air/contrast in laryngeal vestibule Pharyngeal stripping wave : Present - complete Pharyngeal contraction (A/P view only): N/A Pharyngoesophageal segment opening: Partial distention/partial duration, partial  obstruction of flow Tongue base retraction: No contrast between tongue base and posterior pharyngeal wall (PPW) Pharyngeal residue: Collection of residue within or on pharyngeal structures Location of pharyngeal residue: Pyriform sinuses  Esophageal Impairment Domain: Esophageal Impairment Domain Esophageal clearance upright position: Esophageal retention with retrograde flow through the PES Pill: No data recorded Penetration/Aspiration Scale Score: Penetration/Aspiration Scale Score 1.  Material does not enter airway: Solid; Puree; Moderately thick liquids (Level 3, honey thick); Mildly thick liquids (Level 2, nectar thick) 5.  Material enters airway, CONTACTS cords and not ejected out: Thin liquids (Level 0) 8.  Material enters airway, passes BELOW cords without attempt by patient to eject out (silent aspiration) : Thin liquids (Level 0) Compensatory Strategies: Compensatory Strategies Compensatory strategies: Yes Effortful swallow: Ineffective Ineffective Effortful Swallow: Puree Left head turn: Ineffective Ineffective Left Head Turn: Thin liquid (Level 0) Mendelsohn : Effective Effective Mendelsohn: Thin liquid (Level 0)   General Information: Caregiver present: No  Diet Prior to this Study: Dysphagia 3 (mechanical soft); Thin liquids (Level 0)   Temperature : Normal   Respiratory Status: WFL   Supplemental O2: None (Room air)   History of Recent Intubation: No  Behavior/Cognition: Alert; Cooperative; Pleasant mood Self-Feeding Abilities: Able to self-feed Baseline vocal quality/speech: Normal Volitional Cough: Able to elicit No data recorded Exam Limitations: No limitations Goal Planning: Prognosis for improved oropharyngeal function: -- (fair-good) Barriers to Reach Goals: Other (Comment) (pt has aphasia) No data recorded No data recorded Consulted and agree with results and recommendations: Patient; Family member/caregiver; Physician Pain: Pain Assessment Pain Assessment: Faces Faces Pain Scale: 0 End of  Session: Start Time:SLP Start Time (ACUTE ONLY): 1208 Stop Time: SLP Stop Time (ACUTE ONLY): 1228 Time Calculation:SLP Time Calculation (min) (ACUTE ONLY): 20 min Charges: SLP Evaluations $ SLP Speech Visit: 1 Visit SLP Evaluations $BSS Swallow: 1 Procedure $MBS Swallow: 1 Procedure SLP visit diagnosis: SLP Visit Diagnosis: Dysphagia, pharyngoesophageal phase (R13.14) Past Medical History: Past Medical History: Diagnosis Date  Allergy   Complication of anesthesia   pt reports he is starting to have more difficulty arousing after surgery  Diabetes mellitus (HCC)   GERD (gastroesophageal reflux disease)   Headache   History of kidney stones   Hyperlipidemia   Renal disorder   kidney stones Past Surgical History: Past Surgical History: Procedure Laterality Date  APPLICATION OF CRANIAL NAVIGATION Right 01/17/2021  Procedure: APPLICATION OF CRANIAL NAVIGATION;  Surgeon: Bedelia Person, MD;  Location: [facility] OR;  Service: Neurosurgery;  Laterality: Right;  COLONOSCOPY  09/03/1994  COLONOSCOPY WITH PROPOFOL N/A 01/07/2023  Procedure: COLONOSCOPY WITH PROPOFOL;  Surgeon: [name], MD;  Location: [facility] ENDOSCOPY;  Service: Endoscopy;  Laterality: N/A;  NOT TOO EARLY  CYSTOSCOPY WITH STENT PLACEMENT Right 01/25/2016  Procedure: CYSTOSCOPY WITH STENT PLACEMENT;  Surgeon: Hildred Laser, MD;  Location: [facility] ORS;  Service: Urology;  Laterality: Right;  FRAMELESS  BIOPSY WITH BRAINLAB Right 01/17/2021  Procedure: RIGHT STEREOTACTIC BIOPSY OF INSULAR LESION;  Surgeon: Bedelia Person, MD;  Location: [facility]  OR;  Service: Neurosurgery;  Laterality: Right;  KNEE ARTHROSCOPY W/ MENISCAL REPAIR Right 06/23/1988  POLYPECTOMY  01/07/2023  Procedure: POLYPECTOMY;  Surgeon: [name], MD;  Location: [facility] ENDOSCOPY;  Service: Endoscopy;;  removal of birthmark  06/08/2013  SKIN CANCER EXCISION  06/23/2012  TYMPANOSTOMY TUBE PLACEMENT  08/06/1978  URETEROSCOPY WITH HOLMIUM LASER LITHOTRIPSY Right 01/25/2016  Procedure: URETEROSCOPY WITH  HOLMIUM LASER LITHOTRIPSY;  Surgeon: Hildred Laser, MD;  Location: [facility] ORS;  Service: Urology;  Laterality: Right;  URETHRAL STRICTURE DILATATION    visual inspection of vocal cord    1973  WISDOM TOOTH EXTRACTION   [name] 09/16/2023, 3:36 PM  MR BRAIN W CONTRAST Result Date: 09/15/2023 CLINICAL DATA:  53 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 EXAM: MRI HEAD WITH CONTRAST TECHNIQUE: Multiplanar, multiecho pulse sequences of the brain and surrounding structures were obtained with intravenous contrast. CONTRAST:  7mL GADAVIST GADOBUTROL 1 MMOL/ML IV SOLN COMPARISON:  Noncontrast brain MRI yesterday and earlier FINDINGS: Heterogeneous enhancement of the chronic infiltrative right frontal lobe predominant tumor is stable compared to 09/07/2023. There remains regional ex vacuo enlargement of the right lateral ventricle. The punctate site of right insula enhancement on 09/07/2023 is now enhancing on series 6, image 26. And similar enhancement of the punctate left mesial temporal lobe diffusion restricted lesion seen at that time. Both of those areas have faded on DWI yesterday. However, ongoing and intense abnormal diffusion at the right cerebral peduncle (yesterday), not significantly changed over this series of exams, and with questionable petechial enhancement there now on this exam series 6, image 20. No other new areas of enhancement are identified. The major dural venous sinuses are enhancing and appear to be patent. IMPRESSION: Constellation of findings over this series of multiple MRIs without and with contrast since 09/07/2023 suspicious for: - infiltrative tumor progression into the right cerebral peduncle. - superimposed scattered acute and subacute lacunar infarcts, with several from 09/07/2023 now faded on diffusion and with discrete post-ischemic appearing enhancement. Recommend  continued MRI surveillance without and with contrast ( 4-6 weeks suggested time frame) to further evaluate the evolution of the above. Electronically Signed   By: Odessa Fleming M.D.   On: 09/15/2023 15:01   Overnight EEG with video Result Date: 09/15/2023 Charlsie Quest, MD     09/16/2023  9:51 AM Patient Name: Alan Wise MRN: [id number] Epilepsy Attending: Charlsie Quest Referring Physician/Provider: Charlsie Quest, MD Duration: 09/14/2023 1503 to 09/15/2023 1503 Patient history: 53yo M with hx of oligodendroglioma followed by Dr. 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 EEG to evaluate for seizure Level of alertness: Awake, asleep AEDs during EEG study: LCM Technical aspects: This EEG study was done with scalp electrodes positioned according to the 10-20 International system of electrode placement. Electrical activity was reviewed with band pass filter of 1-70Hz , sensitivity of 7 uV/mm, display speed of 30mm/sec with a 60Hz  notched filter applied as appropriate. EEG data were recorded continuously and digitally stored.  Video monitoring was available and reviewed as appropriate. Description: The posterior dominant rhythm consists of 8-9 Hz activity of moderate voltage (25-35 uV) seen predominantly in posterior head regions, asymmetric ( right<right )  and reactive to eye opening and eye closing. Sleep was characterized by vertex waves, sleep spindles (12 to 14 Hz), maximal frontocentral region. EEG  showed continuous 3 to 6 Hz theta-delta slowing in right hemisphere, maximal right temporal region. Hyperventilation and photic stimulation were not performed.   Event button was pressed on 09/15/2023 at around 1015 and 1234 for right leg shaking and altered mental status.  Concomitant EEG before, during and after the event did not show any EEG changes suggest  seizure.  ABNORMALITY - Continuous slow, right hemisphere, maximal right temporal region  IMPRESSION: This study is suggestive of cortical dysfunction arising from ight hemisphere, maximal right temporal region likely secondary to underlying structural abnormality. No seizures or epileptiform discharges were seen throughout the recording. Event button was pressed on 09/15/2023 at around 1015 and 1234 for right leg shaking and altered mental status without concomitant EEG change.  This could have been a nonepileptic event.  However focal motor seizures may not be seen on scalp EEG.  Therefore clinical correlation is recommended.  Charlsie Quest   MR BRAIN WO CONTRAST Result Date: 09/14/2023 CLINICAL DATA:  Neuro deficit, acute, stroke suspected. Progressive left-sided weakness. Progressive dysphasia. EXAM: MRI HEAD WITHOUT CONTRAST TECHNIQUE: Multiplanar, multiecho pulse sequences of the brain and surrounding structures were obtained without intravenous contrast. COMPARISON:  MR head 09/09/2023. FINDINGS: Brain: The diffusion-weighted images redemonstrate an 8 mm acute/subacute infarct involving the right cerebral peduncle. Previously noted restricted diffusion in the left hippocampus, potentially related to seizure activity has resolved. A new punctate focus of restricted diffusion is present in the anterior insular cortex. Restricted diffusion in the anterior right frontal lobe and along the atrium of the right lateral ventricle is similar the prior study. Diffusion changes within the genu of the corpus callosum have improved. No new hemorrhage is present. No new foci of restricted diffusion are present. Periventricular T2 hyperintensities in the left hemisphere are stable. Chronic encephalomalacia of the right frontal operculum is stable. White matter changes extend along the right cerebral peduncle into the pons. The brainstem and cerebellum are otherwise within normal limits. Ex vacuo dilation of the right  lateral ventricle is noted. No significant extraaxial fluid collection is present. The internal auditory canals are within normal limits. Vascular: Insert normal flow Skull and upper cervical spine: The craniocervical junction is normal. Upper cervical spine is within normal limits. Marrow signal is unremarkable. Sinuses/Orbits: The paranasal sinuses and mastoid air cells are clear. Bilateral lens replacements are noted. Globes and orbits are otherwise unremarkable. Other: IMPRESSION: 1. New punctate focus of restricted diffusion in the anterior insular cortex compatible with a new acute/subacute infarct. 2. 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 Tumor is considered less likely. 3. Stable restricted diffusion in the anterior right frontal lobe and along the atrium of the right lateral ventricle consistent with treated tumor. 4. Improved diffusion changes within the genu of the corpus callosum suggesting these were ischemic. 5. Stable chronic encephalomalacia of the right frontal operculum. 6. Stable periventricular T2 hyperintensities in the left hemisphere. This likely reflects the sequela of chronic microvascular ischemia. Electronically Signed   By: Marin Roberts M.D.   On: 09/14/2023 15:32   ECHOCARDIOGRAM COMPLETE Result Date: 09/10/2023    ECHOCARDIOGRAM REPORT   Patient Name:   Alan Wise Date of Exam: 09/10/2023 Medical Rec #:  [id number]         Height:       68.0 in Accession #:    [id number]        Weight:       148.1 lb Date of Birth:  Aug 10, 1970  BSA:          1.799 m Patient Age:    52 years          BP:           138/81 mmHg Patient Gender: M                 HR:           55 bpm. Exam Location:  [facility] Procedure: 2D Echo, Cardiac Doppler, Color Doppler and Saline Contrast Bubble            Study (Both Spectral and Color Flow Doppler were utilized during            procedure). Indications:     Stroke I63.9  History:         Patient has prior  history of Echocardiogram examinations, most                  recent 01/15/2022. Risk Factors:Diabetes and Dyslipidemia.  Sonographer:     Cristela Blue Referring Phys:  [id number] AMY N [facility] Diagnosing Phys: Julien Nordmann MD  Sonographer Comments: No parasternal window and no subcostal window. IMPRESSIONS  1. Left ventricular ejection fraction, by estimation, is 55 to 60%. The left ventricle has normal function. The left ventricle has no regional wall motion abnormalities. Left ventricular diastolic parameters are consistent with Grade I diastolic dysfunction (impaired relaxation).  2. Right ventricular systolic function is normal. The right ventricular size is normal. There is normal pulmonary artery systolic pressure. The estimated right ventricular systolic pressure is 25.8 mmHg.  3. The mitral valve is normal in structure. No evidence of mitral valve regurgitation. No evidence of mitral stenosis.  4. The aortic valve is normal in structure. Aortic valve regurgitation is not visualized. No aortic stenosis is present.  5. The inferior vena cava is normal in size with greater than 50% respiratory variability, suggesting right atrial pressure of 3 mmHg.  6. Agitated saline contrast bubble study was negative, with no evidence of any interatrial shunt. FINDINGS  Left Ventricle: Left ventricular ejection fraction, by estimation, is 55 to 60%. The left ventricle has normal function. The left ventricle has no regional wall motion abnormalities. Strain was performed and the global longitudinal strain is indeterminate. The left ventricular internal cavity size was normal in size. There is no left ventricular hypertrophy. Left ventricular diastolic parameters are consistent with Grade I diastolic dysfunction (impaired relaxation). Right Ventricle: The right ventricular size is normal. No increase in right ventricular wall thickness. Right ventricular systolic function is normal. There is normal pulmonary artery systolic pressure.  The tricuspid regurgitant velocity is 2.28 m/s, and  with an assumed right atrial pressure of 5 mmHg, the estimated right ventricular systolic pressure is 25.8 mmHg. Left Atrium: Left atrial size was normal in size. Right Atrium: Right atrial size was normal in size. Pericardium: There is no evidence of pericardial effusion. Mitral Valve: The mitral valve is normal in structure. No evidence of mitral valve regurgitation. No evidence of mitral valve stenosis. MV peak gradient, 2.2 mmHg. The mean mitral valve gradient is 1.0 mmHg. Tricuspid Valve: The tricuspid valve is normal in structure. Tricuspid valve regurgitation is not demonstrated. No evidence of tricuspid stenosis. Aortic Valve: The aortic valve is normal in structure. Aortic valve regurgitation is not visualized. No aortic stenosis is present. Aortic valve mean gradient measures 2.0 mmHg. Aortic valve peak gradient measures 4.0 mmHg. Aortic valve area, by VTI measures 3.01 cm. Pulmonic Valve: The pulmonic  valve was normal in structure. Pulmonic valve regurgitation is not visualized. No evidence of pulmonic stenosis. Aorta: The aortic root is normal in size and structure. Venous: The inferior vena cava is normal in size with greater than 50% respiratory variability, suggesting right atrial pressure of 3 mmHg. IAS/Shunts: No atrial level shunt detected by color flow Doppler. Agitated saline contrast was given intravenously to evaluate for intracardiac shunting. Agitated saline contrast bubble study was negative, with no evidence of any interatrial shunt. There  is no evidence of a patent foramen ovale. There is no evidence of an atrial septal defect. Additional Comments: 3D was performed not requiring image post processing on an independent workstation and was indeterminate.  LEFT VENTRICLE PLAX 2D LVIDd:         4.10 cm   Diastology LVIDs:         2.60 cm   LV e' medial:    5.33 cm/s LV PW:         1.20 cm   LV E/e' medial:  11.8 LV IVS:        1.10 cm   LV  e' lateral:   6.85 cm/s LVOT diam:     2.00 cm   LV E/e' lateral: 9.2 LV SV:         52 LV SV Index:   29 LVOT Area:     3.14 cm  RIGHT VENTRICLE RV Basal diam:  2.10 cm RV Mid diam:    2.00 cm RV S prime:     11.60 cm/s TAPSE (M-mode): 1.6 cm LEFT ATRIUM           Index        RIGHT ATRIUM          Index LA diam:      3.30 cm 1.83 cm/m   RA Area:     7.43 cm LA Vol (A2C): 21.5 ml 11.95 ml/m  RA Volume:   11.30 ml 6.28 ml/m LA Vol (A4C): 13.1 ml 7.28 ml/m  AORTIC VALVE AV Area (Vmax):    2.78 cm AV Area (Vmean):   2.94 cm AV Area (VTI):     3.01 cm AV Vmax:           100.00 cm/s AV Vmean:          59.800 cm/s AV VTI:            0.172 m AV Peak Grad:      4.0 mmHg AV Mean Grad:      2.0 mmHg LVOT Vmax:         88.60 cm/s LVOT Vmean:        56.000 cm/s LVOT VTI:          0.165 m LVOT/AV VTI ratio: 0.96  AORTA Ao Root diam: 2.60 cm MITRAL VALVE               TRICUSPID VALVE MV Area (PHT): 3.58 cm    TR Peak grad:   20.8 mmHg MV Area VTI:   2.50 cm    TR Vmax:        228.00 cm/s MV Peak grad:  2.2 mmHg MV Mean grad:  1.0 mmHg    SHUNTS MV Vmax:       0.75 m/s    Systemic VTI:  0.16 m MV Vmean:      49.3 cm/s   Systemic Diam: 2.00 cm MV Decel Time: 212 msec MV E velocity: 62.90 cm/s MV A velocity: 75.50 cm/s MV E/A  ratio:  0.83 Julien Nordmann MD Electronically signed by Julien Nordmann MD Signature Date/Time: 09/10/2023/11:37:44 AM    Final    MR BRAIN WO CONTRAST Result Date: 09/10/2023 CLINICAL DATA:  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 EXAM: MRI HEAD WITHOUT CONTRAST TECHNIQUE: Multiplanar, multiecho pulse sequences of the brain and surrounding structures were obtained without intravenous contrast. COMPARISON:  Comparison made with CT from earlier the same day as well as multiple previous studies, including recent brain MRI from 09/07/2023 FINDINGS: Brain: Cerebral volume loss with mild chronic microvascular ischemic disease, stable. Few scattered superimposed remote lacunar  infarcts noted about the pons. Previously noted areas of diffusion signal abnormality involving the anterior genu of the corpus callosum, right insula, and mesial left temporal lobe are slightly decreased in prominence from prior, likely reflecting small evolving subacute infarcts. No associated hemorrhage or mass effect. Additional 7 mm focus of diffusion signal abnormality at the right cerebral peduncle is relatively stable, and remains indeterminate, possibly reflecting ischemic change and/or tumor involvement. No other new or interval areas of infarction. Multifocal signal abnormality involving the right frontal and temporal lobes, consistent with known brain tumor. Few scattered foci of associated cystic encephalomalacia with associated T2/FLAIR signal. Associated diffusion signal abnormality is relatively stable. Scattered areas of intrinsic precontrast T1 hyperintensity and susceptibility artifact are also not appreciably changed, likely reflecting post treatment changes and/or chronic blood products. No evidence for new intracranial hemorrhage by MRI. Previously noted hyperdensity on prior CT corresponds with these signal changes. Although these changes on CT from earlier today are new as compared to most recent CT from 05/13/2023, these changes not appreciably changed from intervening brain MRIs, with no evidence for new or interval hemorrhage. No other mass lesion or mass effect. Stable ventricular size and morphology without hydrocephalus. No extra-axial fluid collection. Pituitary gland within normal limits. No other intrinsic temporal lobe abnormality. Vascular: Major intracranial vascular flow voids are maintained. Skull and upper cervical spine: Craniocervical junction within normal limits. Bone marrow signal intensity normal. No scalp soft tissue abnormality. Sinuses/Orbits: Globes and orbital soft tissues within normal limits. Mild mucosal thickening present about the ethmoidal air cells and  maxillary sinuses, slightly worse on the left. No significant mastoid effusion. Other: None. IMPRESSION: 1. No acute intracranial abnormality. 2. Previously described subcentimeter areas of diffusion signal abnormality (seen on recent brain MRI from 09/07/2023) involving the anterior genu of the corpus callosum, right insula, and mesial left temporal lobe are slightly decreased in prominence from prior, likely reflecting evolving subacute infarcts. No associated hemorrhage or mass effect. No areas of new or interval infarction. 3. Additional 7 mm focus of diffusion signal abnormality at the right cerebral peduncle is relatively stable, and remains indeterminate, possibly reflecting ischemic change and/or tumor involvement. Attention at follow-up recommended. 4. Multifocal signal abnormality involving the right frontal and temporal lobes, consistent with known brain tumor. Overall appearance is not significantly changed, with no evidence for new or interval hemorrhage by MRI. 5. Underlying atrophy with chronic microvascular ischemic disease, with a few scattered remote lacunar infarcts about the pons. Electronically Signed   By: Rise Mu M.D.   On: 09/10/2023 00:51   CT HEAD WO CONTRAST ( ) Result Date: 09/09/2023 CLINICAL DATA:  History of oligodendroglioma, presenting with confusion and chest tightness. EXAM: CT HEAD WITHOUT CONTRAST TECHNIQUE: Contiguous axial images were obtained from the base of the skull through the vertex without intravenous contrast. RADIATION DOSE REDUCTION: This exam was performed according to the departmental dose-optimization program which  includes automated exposure control, adjustment of the mA and/or kV according to patient size and/or use of iterative reconstruction technique. COMPARISON:  May 13, 2023 FINDINGS: Brain: There is generalized cerebral atrophy with widening of the extra-axial spaces and ventricular dilatation. There are areas of decreased attenuation  within the white matter tracts of the supratentorial brain, consistent with microvascular disease changes. Post treatment changes are again seen within the right frontal lobe. A mild-to-moderate amount of curvilinear increased attenuation (approximately 57.92 Hounsfield units) is also seen within this region, along the junction of the cortex and white matter. Mild involvement of the right parietal lobe is noted. There is no evidence of associated mass effect or midline shift. Vascular: Marked severity bilateral cavernous carotid artery calcification is noted. Skull: Normal. Negative for fracture or focal lesion. Sinuses/Orbits: There is marked severity left ethmoid sinus mucosal thickening. Other: None. IMPRESSION: 1. Post treatment changes within the right frontal lobe with a mild-to-moderate amount of curvilinear increased attenuation within this region, as described above. Given the patient's history of oligodendroglioma, this may represent areas of post treatment mineralization and calcification. A small amount of acute hemorrhage cannot completely be excluded. MRI correlation is recommended. 2. Generalized cerebral atrophy and microvascular disease changes of the supratentorial brain. 3. Marked severity left ethmoid sinus disease. Electronically Signed   By: Aram Candela M.D.   On: 09/09/2023 21:11   MR ANGIO NECK W WO CONTRAST Result Date: 09/09/2023 CLINICAL DATA:  Follow-up examination for stroke. EXAM: MRA NECK WITHOUT AND WITH CONTRAST MRA HEAD WITHOUT CONTRAST TECHNIQUE: Multiplanar and multiecho pulse sequences of the neck were obtained without and with intravenous contrast. Angiographic images of the neck were obtained using MRA technique without and with intravenous contrast; Angiographic images of the Circle of Willis were obtained using MRA technique without intravenous contrast. CONTRAST:  6mL GADAVIST GADOBUTROL 1 MMOL/ML IV SOLN COMPARISON:  Comparison made with brain MRI from 09/07/2023.  FINDINGS: MRA NECK FINDINGS Aortic arch: Visualized aortic arch within normal limits for caliber with standard 3 vessel morphology. No stenosis about the origin of the great vessels. Right carotid system: Right common and internal carotid arteries are patent with antegrade flow. No dissection or hemodynamically significant stenosis about the right carotid artery system. Left carotid system: Left common and internal carotid arteries are patent with antegrade flow. No dissection or hemodynamically significant stenosis about the left carotid artery system. Vertebral arteries: Both vertebral arteries arise from subclavian arteries. Vertebral arteries are patent with antegrade flow. No dissection or hemodynamically significant stenosis. Other: None. MRA HEAD FINDINGS Anterior circulation: Both internal carotid arteries are patent to the termini without stenosis or other abnormality. Right A1 segment patent. Left A1 hypoplastic and/or absent. Normal anterior communicating artery complex. Anterior cerebral arteries patent without stenosis. No M1 stenosis or occlusion. Distal MCA branches perfused and symmetric. Posterior circulation: Visualized V4 segments patent without stenosis. Right PICA patent. Left PICA not seen. Basilar patent without stenosis. Superior cerebral arteries patent bilaterally. Fetal type origin of the PCAs bilaterally. Both PCAs patent to their distal aspects without significant stenosis. Anatomic variants: As above. Other: No intracranial aneurysm. IMPRESSION: Normal MRA of the head and neck. No large vessel occlusion, hemodynamically significant stenosis, or other acute vascular abnormality. Electronically Signed   By: Rise Mu M.D.   On: 09/09/2023 20:17   MR ANGIO HEAD WO CONTRAST Result Date: 09/09/2023 CLINICAL DATA:  Follow-up examination for stroke. EXAM: MRA NECK WITHOUT AND WITH CONTRAST MRA HEAD WITHOUT CONTRAST TECHNIQUE: Multiplanar and multiecho pulse  sequences of the neck  were obtained without and with intravenous contrast. Angiographic images of the neck were obtained using MRA technique without and with intravenous contrast; Angiographic images of the Circle of Willis were obtained using MRA technique without intravenous contrast. CONTRAST:  6mL GADAVIST GADOBUTROL 1 MMOL/ML IV SOLN COMPARISON:  Comparison made with brain MRI from 09/07/2023. FINDINGS: MRA NECK FINDINGS Aortic arch: Visualized aortic arch within normal limits for caliber with standard 3 vessel morphology. No stenosis about the origin of the great vessels. Right carotid system: Right common and internal carotid arteries are patent with antegrade flow. No dissection or hemodynamically significant stenosis about the right carotid artery system. Left carotid system: Left common and internal carotid arteries are patent with antegrade flow. No dissection or hemodynamically significant stenosis about the left carotid artery system. Vertebral arteries: Both vertebral arteries arise from subclavian arteries. Vertebral arteries are patent with antegrade flow. No dissection or hemodynamically significant stenosis. Other: None. MRA HEAD FINDINGS Anterior circulation: Both internal carotid arteries are patent to the termini without stenosis or other abnormality. Right A1 segment patent. Left A1 hypoplastic and/or absent. Normal anterior communicating artery complex. Anterior cerebral arteries patent without stenosis. No M1 stenosis or occlusion. Distal MCA branches perfused and symmetric. Posterior circulation: Visualized V4 segments patent without stenosis. Right PICA patent. Left PICA not seen. Basilar patent without stenosis. Superior cerebral arteries patent bilaterally. Fetal type origin of the PCAs bilaterally. Both PCAs patent to their distal aspects without significant stenosis. Anatomic variants: As above. Other: No intracranial aneurysm. IMPRESSION: Normal MRA of the head and neck. No large vessel occlusion,  hemodynamically significant stenosis, or other acute vascular abnormality. Electronically Signed   By: Rise Mu M.D.   On: 09/09/2023 20:17   DG Chest Port 1 View Result Date: 09/09/2023 CLINICAL DATA:  [id number] Chest pain [id number] EXAM: PORTABLE CHEST 1 VIEW COMPARISON:  04/11/2023 FINDINGS: Bilateral lung fields are clear. Bilateral costophrenic angles are clear. Normal cardio-mediastinal silhouette. No acute osseous abnormalities. The soft tissues are within normal limits. IMPRESSION: No active disease. Electronically Signed   By: Jules Schick M.D.   On: 09/09/2023 15:09   MR BRAIN W WO CONTRAST Result Date: 09/07/2023 CLINICAL DATA:  Provided history: Oligodendroglioma. Brain/CNS neoplasm, assess treatment response. Additional history provided by the scanning technologist: Multiple falls, vision loss in left eye, aphasia. Avastin therapy. Restaging. EXAM: MRI HEAD WITHOUT AND WITH CONTRAST TECHNIQUE: Multiplanar, multiecho pulse sequences of the brain and surrounding structures were obtained without and with intravenous contrast. CONTRAST:  6mL GADAVIST GADOBUTROL 1 MMOL/ML IV SOLN COMPARISON:  Brain MRI 07/06/2023. FINDINGS: Brain: Mild generalized cerebral atrophy. 9 mm focus of restricted diffusion with associated T2 FLAIR hyperintense signal abnormality in the right cerebral peduncle (series 5, image 73). 5 mm focus of restricted diffusion with associated T2 FLAIR hyperintense signal abnormality in the anterior left temporal lobe (series 5, image 74). 3 mm focus of restricted diffusion within the callosal genu at midline (series 5, image 81). 5 mm focus of restricted diffusion within the right insula with associated T2 FLAIR hyperintense signal abnormality. These foci are new as compared to the brain MRI of 07/06/2023 and may reflect acute/early subacute infarcts. However, new sites of tumor cannot be excluded. There is a new 3 mm focus of enhancement in the right subinsular region (series  16, image 90). No other new or progressive foci of enhancement are identified. An irregular focus of restricted diffusion within the right retrolenticular/periatrial region has mildly increased in extent since  the prior MRI. A region of restricted diffusion within the mid and anterior right frontal lobe (along the margins of the right lateral ventricle) has also slightly increased in extent (for instance, extending more medially within the body of the corpus callosum on series 10, image 25). T2 FLAIR hyperintense signal abnormality within the right cerebral hemisphere has not significantly changed, nor has T2 FLAIR hyperintense signal abnormality within the body/genu of corpus callosum and left frontal lobe periventricular white matter. Unchanged parenchymal swelling at the right insula. Superimposed foci of chronic blood products within the right frontal lobe have increased in extent. Redemonstrated focus of cystic encephalomalacia at the anterior right insula. Chronic lacunar infarcts again demonstrated within the left aspect of the pons. Mild multifocal T2 FLAIR hyperintense signal abnormality elsewhere within the cerebral white matter and pons, nonspecific but compatible chronic small vessel ischemic disease. No extra-axial fluid collection. No midline shift. Vascular: Maintained flow voids within the proximal large arterial vessels. Skull and upper cervical spine: No focal worrisome marrow lesion. Right frontoparietal burr hole. Sinuses/Orbits: No focal worrisome marrow lesion. Incompletely assessed cervical spondylosis. Other: No mass or acute finding within the imaged orbits. Mild mucosal thickening within the left ethmoid and left maxillary sinuses. MRi brain impression #1 called by telephone at the time of interpretation on 09/07/2023 at 7:28 pm to provider [facility] VASLOW , who verbally acknowledged these results. IMPRESSION: 1. Foci of restricted diffusion and T2 FLAIR hyperintense signal abnormality within  the right cerebral peduncle, anterior left temporal lobe, callosal genu and right insula, as described and new from the prior brain MRI of 07/06/2023. These foci may reflect acute/early subacute infarcts. However, new sites of tumor cannot be excluded. A short-interval follow-up brain MRI is recommended. 2. Foci of restricted diffusion within the right retrolenticular/periatrial region and right frontal lobe have slightly increased in extent since the prior MRI. Additionally, there is a new 3 mm right subinsular focus of parenchymal enhancement. These findings may reflect evolving treatment changes. However, a short-interval follow-up brain MRI is recommended for close surveillance. 3. Pre-contrast T1 hyperintense chronic blood products within the right frontal lobe have increased in extent. 4. Otherwise no significant interval change. 5. 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 Electronically Signed   By: Jackey Loge D.O.   On: 09/07/2023 20:07    Microbiology: No results found for this or any previous visit (from the past 240 hours).   Labs: CBC: Recent Labs  Lab 09/14/23 1039 09/16/23 0531  WBC 10.2 8.0  NEUTROABS 7.6  --   HGB 15.4 15.6  HCT 44.7 43.6  MCV 85.1 83.5  PLT 208 195   Basic Metabolic Panel: Recent Labs  Lab 09/14/23 1039 09/16/23 0531  NA 138 140  K 4.2 3.9  CL 102 102  CO2 30 27  GLUCOSE 139* 116*  BUN 15 12  CREATININE 1.14 0.99  CALCIUM 9.2 9.3  MG  --  2.0  PHOS  --  3.6   Liver Function Tests: Recent Labs  Lab 09/14/23 1039 09/16/23 0531  AST 20 20  ALT 22 24  ALKPHOS 70 70  BILITOT 0.8 0.7  PROT 6.8 6.5  ALBUMIN 4.1 3.8   No results for input(s): "LIPASE", "AMYLASE" in the last 168 hours. No results for input(s): "AMMONIA" in the last 168 hours. Cardiac Enzymes: Recent Labs  Lab 09/16/23 0531  CKTOTAL 37*   BNP (last 3 results) No results for input(s): "BNP" in the  last 8760 hours. CBG: Recent Labs  Lab 09/19/23 0606 09/19/23 1202 09/19/23 1612 09/19/23 2116 09/20/23 0658  GLUCAP 108* 196* 128* 112* 120*    Time spent: 35 minutes  Signed:  Gillis Santa  [facility] 09/20/2023 9:51 AM

## 2023-09-20 NOTE — Progress Notes (Signed)
 Inpatient Rehabilitation Admission Medication Review by a Pharmacist  A complete drug regimen review was completed for this patient to identify any potential clinically significant medication issues.  High Risk Drug Classes Is patient taking? 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 )  Antibiotic No   Opioid No   Antiplatelet No   Hypoglycemics/insulin Yes Insulin aspart SSI - DM  Vasoactive Medication Yes Amlodipine, losartan, propranolol - HTN  Chemotherapy No   Other Yes Acetaminophen- pain Atorvastatin - HLD Cyanocabolamine , Vitamin D- supplements  Lamotrigine - seizure disorder Lorazepam- prn seizures Melatonin - sleep Miralax, bisacodyl, fleets enema- constipation     Type of Medication Issue Identified Description of Issue Recommendation(s)  Drug Interaction(s) (clinically significant)     Duplicate Therapy     Allergy     No Medication Administration End Date     Incorrect Dose     Additional Drug Therapy Needed     Significant med changes from prior encounter (inform family/care partners about these prior to discharge). PTA medication Aspirin EC 81mg  discontinued on [facility] acute discharge (likely due to Apixaban anitcoagulation started.  Communicate to patient /family/ caregiver prior to discharge.   Other       Clinically significant medication issues were identified that warrant physician communication and completion of prescribed/recommended actions by midnight of the next day:  No  Name of provider notified for urgent issues identified:   Provider Method of Notification:    Pharmacist comments:   Time spent performing this drug regimen review (minutes):  15   Noah Delaine, RPh Clinical Pharmacist .09/20/2023 3:30 PM

## 2023-09-21 ENCOUNTER — Telehealth: Payer: Self-pay | Admitting: *Deleted

## 2023-09-21 ENCOUNTER — Ambulatory Visit: Admitting: Speech Pathology

## 2023-09-21 DIAGNOSIS — Z7189 Other specified counseling: Secondary | ICD-10-CM

## 2023-09-21 DIAGNOSIS — I6300 Cerebral infarction due to thrombosis of unspecified precerebral artery: Secondary | ICD-10-CM

## 2023-09-21 DIAGNOSIS — Z515 Encounter for palliative care: Secondary | ICD-10-CM

## 2023-09-21 DIAGNOSIS — I639 Cerebral infarction, unspecified: Secondary | ICD-10-CM | POA: Diagnosis not present

## 2023-09-21 LAB — COMPREHENSIVE METABOLIC PANEL WITH GFR
ALT: 29 U/L (ref 0–44)
AST: 19 U/L (ref 15–41)
Albumin: 3.8 g/dL (ref 3.5–5.0)
Alkaline Phosphatase: 71 U/L (ref 38–126)
Anion gap: 14 (ref 5–15)
BUN: 13 mg/dL (ref 6–20)
CO2: 25 mmol/L (ref 22–32)
Calcium: 9.6 mg/dL (ref 8.9–10.3)
Chloride: 101 mmol/L (ref 98–111)
Creatinine, Ser: 1.03 mg/dL (ref 0.61–1.24)
GFR, Estimated: >60 mL/min (ref >60)
Glucose, Bld: 102 mg/dL — ABNORMAL HIGH (ref 70–99)
Potassium: 4.3 mmol/L (ref 3.5–5.1)
Sodium: 140 mmol/L (ref 135–145)
Total Bilirubin: 0.6 mg/dL (ref 0.0–1.2)
Total Protein: 6.4 g/dL — ABNORMAL LOW (ref 6.5–8.1)

## 2023-09-21 LAB — CBC WITH DIFFERENTIAL/PLATELET
Abs Immature Granulocytes: 0.03 10*3/uL (ref 0.00–0.07)
Basophils Absolute: 0.1 10*3/uL (ref 0.0–0.1)
Basophils Relative: 1 %
Eosinophils Absolute: 1.1 10*3/uL — ABNORMAL HIGH (ref 0.0–0.5)
Eosinophils Relative: 11 %
HCT: 44.7 % (ref 39.0–52.0)
Hemoglobin: 15.7 g/dL (ref 13.0–17.0)
Immature Granulocytes: 0 %
Lymphocytes Relative: 18 %
Lymphs Abs: 1.8 10*3/uL (ref 0.7–4.0)
MCH: 29.2 pg (ref 26.0–34.0)
MCHC: 35.1 g/dL (ref 30.0–36.0)
MCV: 83.2 fL (ref 80.0–100.0)
Monocytes Absolute: 0.7 10*3/uL (ref 0.1–1.0)
Monocytes Relative: 7 %
Neutro Abs: 6.3 10*3/uL (ref 1.7–7.7)
Neutrophils Relative %: 63 %
Platelets: 205 10*3/uL (ref 150–400)
RBC: 5.37 MIL/uL (ref 4.22–5.81)
RDW: 12.5 % (ref 11.5–15.5)
WBC: 9.9 10*3/uL (ref 4.0–10.5)
nRBC: 0 % (ref 0.0–0.2)

## 2023-09-21 LAB — GLUCOSE, CAPILLARY
Glucose-Capillary: 97 mg/dL (ref 70–99)
Glucose-Capillary: 112 mg/dL — ABNORMAL HIGH (ref 70–99)
Glucose-Capillary: 136 mg/dL — ABNORMAL HIGH (ref 70–99)
Glucose-Capillary: 138 mg/dL — ABNORMAL HIGH (ref 70–99)

## 2023-09-21 MED ORDER — VALPROATE SODIUM 100 MG/ML IV SOLN
500.0000 mg | Freq: Three times a day (TID) | INTRAVENOUS | Status: DC
  Filled 2023-09-21 (×2): qty 5

## 2023-09-21 MED ORDER — VALPROATE SODIUM 100 MG/ML IV SOLN
2000.0000 mg | Freq: Once | INTRAVENOUS | Status: DC
  Filled 2023-09-21: qty 20

## 2023-09-21 MED ORDER — ASPIRIN 325 MG PO TABS
325.0000 mg | ORAL_TABLET | Freq: Every day | ORAL | Status: DC
  Administered 2023-09-21: 325 mg via ORAL
  Filled 2023-09-21: qty 1

## 2023-09-21 MED ORDER — ASPIRIN 81 MG PO TBEC
81.0000 mg | DELAYED_RELEASE_TABLET | Freq: Every day | ORAL | Status: DC
  Administered 2023-09-24: 81 mg via ORAL
  Filled 2023-09-21: qty 1

## 2023-09-21 NOTE — Progress Notes (Signed)
 Inpatient Rehabilitation Care Coordinator Assessment and Plan Patient Details  Name: Alan Wise MRN: [id number] Date of Birth: 1971-01-17  Today's Date: 09/21/2023  Hospital Problems: Principal Problem:   CVA (cerebral vascular accident) [facility])  Past Medical History:  Past Medical History:  Diagnosis Date   Allergy    Complication of anesthesia    pt reports he is starting to have more difficulty arousing after surgery   Diabetes mellitus (HCC)    GERD (gastroesophageal reflux disease)    Headache    History of kidney stones    Hyperlipidemia    Renal disorder    kidney stones   Past Surgical History:  Past Surgical History:  Procedure Laterality Date   APPLICATION OF CRANIAL NAVIGATION Right 01/17/2021   Procedure: APPLICATION OF CRANIAL NAVIGATION;  Surgeon: Bedelia Person, MD;  [facility];  Service: Neurosurgery;  Laterality: Right;   COLONOSCOPY  09/03/1994   COLONOSCOPY WITH PROPOFOL N/A 01/07/2023   Procedure: COLONOSCOPY WITH PROPOFOL;  Surgeon: [name], MD;  Location: [facility] ENDOSCOPY;  Service: Endoscopy;  Laterality: N/A;  NOT TOO EARLY   CYSTOSCOPY WITH STENT PLACEMENT Right 01/25/2016   Procedure: CYSTOSCOPY WITH STENT PLACEMENT;  Surgeon: Hildred Laser, MD;  Location: [facility] ORS;  Service: Urology;  Laterality: Right;   FRAMELESS  BIOPSY WITH BRAINLAB Right 01/17/2021   Procedure: RIGHT STEREOTACTIC BIOPSY OF INSULAR LESION;  Surgeon: Bedelia Person, MD;  Location[facility] OR;  Service: Neurosurgery;  Laterality: Right;   KNEE ARTHROSCOPY W/ MENISCAL REPAIR Right 06/23/1988   POLYPECTOMY  01/07/2023   Procedure: POLYPECTOMY;  Surgeon: [name], MD;  Location: [facility] ENDOSCOPY;  Service: Endoscopy;;   removal of birthmark  06/08/2013   SKIN CANCER EXCISION  06/23/2012   TYMPANOSTOMY TUBE PLACEMENT  08/06/1978   URETEROSCOPY WITH HOLMIUM LASER LITHOTRIPSY Right 01/25/2016   Procedure: URETEROSCOPY WITH HOLMIUM LASER LITHOTRIPSY;  Surgeon: Hildred Laser, MD;  Location: [facility] ORS;  Service: Urology;  Laterality: Right;   URETHRAL STRICTURE DILATATION     visual inspection of vocal cord     1973   WISDOM TOOTH EXTRACTION     Social History:  reports that he has never smoked. He has never used smokeless tobacco. He reports that he does not currently use alcohol. He reports that he does not use drugs.  Family / Support Systems Marital Status: Married Patient Roles: Spouse, Parent, Other (Comment) (Professor) Spouse/Significant Other: Leotis Shames [phone] Children: 22 yo and 5 yo Other Supports: [name]-mom [phone] moved closer to them to assist and lives in an apartment one mile away from Anticipated Caregiver: Wife Ability/Limitations of Caregiver: Wife does not work and is prepared to provide care Caregiver Availability: 24/7 Family Dynamics: Close with family has been having health issues and is currently phasing out of [facility] and will go out on disability after this semester, due to health issues.  Social History Preferred language: English Religion: Christian Cultural Background: NA Education: PhD Health Literacy - How often do you need to have someone help you when you read instructions, pamphlets, or other written material from your doctor or pharmacy?: Always Writes: Yes Employment Status: Employed Name of Employer: [employer] phasing out and will go out on disability after this semester Return to Work Plans: Will not plan to return-one class left Legal History/Current Legal Issues: No issues Guardian/Conservator: None in place-according to MD pt is not fully capable of making his own decisions but has refused to sing in the past HCPOA felt would go to wife.  Abuse/Neglect Abuse/Neglect Assessment Can Be Completed: Yes Physical Abuse: Denies Verbal Abuse: Denies Sexual Abuse: Denies Exploitation of patient/patient's resources: Denies Self-Neglect: Denies  Patient response to: Social Isolation - How often do you feel  lonely or isolated from those around you?: Sometimes  Emotional Status Pt's affect, behavior and adjustment status: Pt did not share with worker he wants to stop all o fhis medications. He discussed being on rehab and how he was independent and now is not but was making progress. His wife pulled worker aside outside thre room to share this and the frustration and helpless ness with this. Recent Psychosocial Issues: other health issues oncology follows Psychiatric History: No history with all happening he will need to be seen by neuro-psych and have placed on list for this week Substance Abuse History: NA  Patient / Family Perceptions, Expectations & Goals Pt/Family understanding of illness & functional limitations: Pt and wife can explain his strokes and seizrues, pt feels this will not stop and has talked to the MD regarding this. Wife has been staying here and is supportive and somewhat experated with her husband and his want now. 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, profressor, father, son, friend, etc Anticipated changes in roles/activities/participation: Roles will change at DC Pt/family expectations/goals: Pt states: " I do not want to be like this, it is hard to talk due to my seizures."  Wife states: " I do not think he has thought this through."  [employer]: None Premorbid Home Care/DME Agencies: Other (Comment) (shower seat) Transportation available at discharge: wife pt did not drive due to seizures Is the patient able to respond to transportation needs?: Yes In the past 12 months, has lack of transportation kept you from medical appointments or from getting medications?: No In the past 12 months, has lack of transportation kept you from meetings, work, or from getting things needed for daily living?: No Resource referrals recommended: Neuropsychology  Discharge Planning Living Arrangements:  Spouse/significant other, Children Support Systems: Spouse/significant other, Children, Parent, Other relatives, Friends/neighbors Type of Residence: Private residence Insurance Resources: Media planner (specify) Haematologist) Financial Resources: Employment, Other (Comment) (retirment) Financial Screen Referred: No Living Expenses: Banker Management: Spouse Does the patient have any problems obtaining your medications?: No Home Management: wife Patient/Family Preliminary Plans: Return home with wife and now has told MD he wants to stop his medications. Have asked for Palliative consult to disucss GOC with pt and wife. May need to include his parents who seem to have input. Mom has moved within a mile to assist them with care and their chidlren's care. Care Coordinator Barriers to Discharge: Insurance for SNF coverage, Medication compliance Care Coordinator Anticipated Follow Up Needs: HH/OP  Clinical Impression Pleasant gentleman who is saying now and told MD he wants to stop all of his medications. Wife is not on boar with this and asking his oncologist to talk with him. Palliative consult placed to discuss with all goals of care. Pt's parents are involved and may need to be included. Awaiting therapy evaluations and will follow along to provide support to wife. Neuro-psych to see this week  Lucy Chris 09/21/2023, 10:56 AM

## 2023-09-21 NOTE — Evaluation (Signed)
 Speech Language Pathology Assessment and Plan  Patient Details  Name: Alan Wise MRN: [id number] Date of Birth: 1970/10/12  SLP Diagnosis: Aphasia;Dysphagia;Cognitive Impairments  Rehab Potential: Fair ELOS: 10-12 days    Today's Date: 09/21/2023 SLP Individual Time: 0800-0900 SLP Individual Time Calculation (min): 60 min   Hospital Problem: Principal Problem:   CVA (cerebral vascular accident) [facility])  Past Medical History:  Past Medical History:  Diagnosis Date   Allergy    Complication of anesthesia    pt reports he is starting to have more difficulty arousing after surgery   Diabetes mellitus (HCC)    GERD (gastroesophageal reflux disease)    Headache    History of kidney stones    Hyperlipidemia    Renal disorder    kidney stones   Past Surgical History:  Past Surgical History:  Procedure Laterality Date   APPLICATION OF CRANIAL NAVIGATION Right 01/17/2021   Procedure: APPLICATION OF CRANIAL NAVIGATION;  Surgeon: Bedelia Person, MD;  Location: [facility] OR;  Service: Neurosurgery;  Laterality: Right;   COLONOSCOPY  09/03/1994   COLONOSCOPY WITH PROPOFOL N/A 01/07/2023   Procedure: COLONOSCOPY WITH PROPOFOL;  Surgeon: [name], MD;  Location: [facility] ENDOSCOPY;  Service: Endoscopy;  Laterality: N/A;  NOT TOO EARLY   CYSTOSCOPY WITH STENT PLACEMENT Right 01/25/2016   Procedure: CYSTOSCOPY WITH STENT PLACEMENT;  Surgeon: Hildred Laser, MD;  Location: [facility] ORS;  Service: Urology;  Laterality: Right;   FRAMELESS  BIOPSY WITH BRAINLAB Right 01/17/2021   Procedure: RIGHT STEREOTACTIC BIOPSY OF INSULAR LESION;  Surgeon: Bedelia Person, MD;  Location[facility] OR;  Service: Neurosurgery;  Laterality: Right;   KNEE ARTHROSCOPY W/ MENISCAL REPAIR Right 06/23/1988   POLYPECTOMY  01/07/2023   Procedure: POLYPECTOMY;  Surgeon: [name], MD;  Location: [facility] ENDOSCOPY;  Service: Endoscopy;;   removal of birthmark  06/08/2013   SKIN CANCER EXCISION  06/23/2012    TYMPANOSTOMY TUBE PLACEMENT  08/06/1978   URETEROSCOPY WITH HOLMIUM LASER LITHOTRIPSY Right 01/25/2016   Procedure: URETEROSCOPY WITH HOLMIUM LASER LITHOTRIPSY;  Surgeon: Hildred Laser, MD;  Location: [facility] ORS;  Service: Urology;  Laterality: Right;   URETHRAL STRICTURE DILATATION     visual inspection of vocal cord     1973   WISDOM TOOTH EXTRACTION      Assessment / Plan / Recommendation Clinical Impression Pt is a  53 y.o. year old male  who  has a past medical history of Allergy, Complication of anesthesia, Diabetes mellitus (HCC), GERD (gastroesophageal reflux disease), Headache, History of kidney stones, Hyperlipidemia, and Renal disorder.  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  They presented to the hospital 09/14/23 with worsening gait, left-sided weakness, and new onset dysphagia.  Of note, he was recently hospitalized at home announce from 3-19 to 3-21 with new CVA and breakthrough seizures.  In the ER, he was started on IV fluids and MRI brain showed new punctate focus of restricted diffusion in the anterior insular cortex compatible with a new acute/subacute infarct, stable 8 mm acute/subacute infarct involving the right cerebral peduncle, stable restricted diffusion in the anterior right frontal lobe and along the atrium of the right lateral ventricle consistent with treated tumor, improved diffusion changes within the genu of the corpus callosum suggesting these were ischemic, stable chronic encephalomalacia of the right frontal operculum, and stable periventricular T2 hyperintensities in the left Hemisphere likely reflects the sequela of chronic microvascular ischemia. He was started on Eliquis, AEDs were adjusted, and admitted with evaluation by  neurology and Dr. Barbaraann Cao with neuro-oncology, cause of recent decline determined to be multifactorial secondary to radiation necrosis, breakthrough seizures, and multiple recent strokes.  Hospitalization was  complicated by dysphagia, which GI determined to be neurologic.    Cognitive-linguistic Eval: Severe expressive and moderate receptive aphasia evident across structured and functional eval tasks. He presented w/ moderately fluent yet empty speech in conversation and severe word finding deficits during structured naming tasks. Additionally, he presented w/ variable awareness of errors. He demonstrated adequate auditory comprehension during structured tasks, though reduced comprehension was evident in tasks of increasing complexity such as conversation re recent medical hx, OP ST, and dysphagia. Pt noted to become agitated with his wife during Y/N questions subtest, as she assisted SLP to provide additional information re an error made. Some verbal agitation also noted when SLP attempted to clarify pt's preference for swallowing meds. Difficult to differentiate emotional regulation deficits from true neurological impairments vs psychological/behavioral vs frustration re communication breakdowns given complexity of pt's recent medical hx. Visual deficits c/w L homonymous hemianopsia noted as well and pt was unable to compensate for said deficits.   Bedside Swallow Eval: Swallow eval completed in conjunction w/ morning meal; comprising of regular textures, pureed textures, and thin liquids. Pt presented w/ nearly constant immediate throat clear w/ grits (pureed) and pancakes (regular). Only throat clear x1 noted w/ sausage. Intermittent throat clears noted w/ thin liquids via cup. Pt's wife reported that "certain textures make him choke, others don't." She also endorsed improved success w/ Dys 3 textures vs regular textures. MBSS completed 3/26 in acute care revealed that "Pt's pharyngoesophageal segment closes prematurely causing consistent backflow into his pyriform sinuses throughout the study and esophagus appeared to remain open after the swallow on several occasions. He had residue in his proximal esophagus  with thicker consistencies in addition to backflow possibly explaining sudden coughing during meals." GI consulted, however, an EGD was not recommended given high risk for pt and GI stated that "this finding was most likely neurologic in etiology." He would benefit from continued PO trials to ID least restrictive diet textures and may benefit from repeat MBSS if difficulties continue. Recommend continuation of pharyngeal strengthening exercises/maneuvers to facilitate improved pharyngeal constriction and PES opening. At this time, recommend Dys 3 textures, thin liquids, and meds whole in puree. He requires full supervision during meals to ensure utilization of safe swallow strategies.   Pt would benefit from skilled ST services to target dysphagia and cognitive-communication deficits mentioned above to facilitate improved communication of wants/needs, tolerance of least restrictive diet, maximize pt independence, and reduce caregiver burden.    Skilled Therapeutic Interventions          SLP facilitated a cognitive-linguistic and bedside swallow eval to assess pt's cognitive-communication skills and determine need for additional skilled ST services. See above for more information.     SLP Assessment  Patient will need skilled Speech Lanaguage Pathology Services during CIR admission    Recommendations  SLP Diet Recommendations: Dysphagia 3 (Mech soft);Thin Liquid Administration via: Cup Medication Administration: Whole meds with puree Supervision: Patient able to self feed Compensations: Slow rate;Small sips/bites;Follow solids with liquid;Clear throat intermittently;Minimize environmental distractions Postural Changes and/or Swallow Maneuvers: Seated upright 90 degrees;Upright 30-60 min after meal Oral Care Recommendations: Oral care BID Recommendations for Other Services: Neuropsych consult;Therapeutic Recreation consult Therapeutic Recreation Interventions: Pet therapy Patient destination:  Home Follow up Recommendations: Outpatient SLP;24 hour supervision/assistance Equipment Recommended: To be determined    SLP Frequency 3 to 5  out of 7 days   SLP Duration  SLP Intensity  SLP Treatment/Interventions 10-12 days  Minumum of 1-2 x/day, 30 to 90 minutes  Cognitive remediation/compensation;Internal/external aids;Environmental controls;Multimodal communication approach;Speech/Language facilitation;Therapeutic Exercise;Therapeutic Activities;Patient/family education;Functional tasks;Dysphagia/aspiration precaution training;Cueing hierarchy    Pain Pain Assessment Pain Scale: 0-10 Pain Score: 0-No pain  Prior Functioning Type of Home: House  Lives With: Spouse (2 small children) Available Help at Discharge: Family;Available 24 hours/day  SLP Evaluation Cognition Overall Cognitive Status: Difficult to assess Arousal/Alertness: Awake/alert Year: 2025 Month: March Day of Week: Incorrect Attention: Sustained;Focused Focused Attention: Appears intact Sustained Attention: Appears intact Safety/Judgment: Impaired  Comprehension Auditory Comprehension Overall Auditory Comprehension: Impaired Yes/No Questions: Impaired Basic Immediate Environment Questions: 75-100% accurate Complex Questions: 75-100% accurate Commands: Within Functional Limits Conversation: Complex Interfering Components: Attention;Visual impairments EffectiveTechniques: Extra processing time;Repetition Reading Comprehension Reading Status: Not tested Expression Expression Primary Mode of Expression: Verbal Verbal Expression Overall Verbal Expression: Impaired Initiation: No impairment Level of Generative/Spontaneous Verbalization: Phrase Repetition: Impaired Level of Impairment: Sentence level Naming: Impairment Responsive: 0-25% accurate Confrontation: Impaired Other Naming Comments: 0-25% accuracy during confrontational naming tasks Verbal Errors: Not aware of errors;Semantic  paraphasias;Phonemic paraphasias Effective Techniques: Sentence completion;Phonemic cues;Semantic cues Non-Verbal Means of Communication: Gestures Written Expression Dominant Hand: Left Written Expression: Exceptions to [facility] Dictation Ability: Word Oral Motor Oral Motor/Sensory Function Overall Oral Motor/Sensory Function: Mild impairment Facial ROM: Reduced left;Suspected CN VII (facial) dysfunction Facial Symmetry: Abnormal symmetry left;Suspected CN VII (facial) dysfunction Facial Strength: Reduced left;Suspected CN VII (facial) dysfunction Facial Sensation: Reduced left;Suspected CN V (Trigeminal) dysfunction Motor Speech Overall Motor Speech: Appears within functional limits for tasks assessed Respiration: Within functional limits Resonance: Within functional limits Articulation: Within functional limitis Intelligibility: Intelligible  Care Tool Care Tool Cognition Ability to hear (with hearing aid or hearing appliances if normally used Ability to hear (with hearing aid or hearing appliances if normally used): 0. Adequate - no difficulty in normal conservation, social interaction, listening to TV   Expression of Ideas and Wants Expression of Ideas and Wants: 2. Frequent difficulty - frequently exhibits difficulty with expressing needs and ideas   Understanding Verbal and Non-Verbal Content Understanding Verbal and Non-Verbal Content: 3. Usually understands - understands most conversations, but misses some part/intent of message. Requires cues at times to understand  Memory/Recall Ability Memory/Recall Ability : Current season;That he or she is in a hospital/hospital unit   Motor Speech Assessment  Intelligibility: Intelligible  Bedside Swallowing Assessment General Date of Onset: 09/14/23 Previous Swallow Assessment: MBSS 3/26 recommended regular textures/thin liquids Diet Prior to this Study: Thin liquids (Level 0);Regular Respiratory Status: Room air History of Recent  Intubation: No Behavior/Cognition: Alert;Cooperative;Distractible;Requires cueing Oral Cavity - Dentition: Adequate natural dentition Self-Feeding Abilities: Able to feed self Vision: Functional for self-feeding Patient Positioning: Upright in bed Baseline Vocal Quality: Normal Volitional Cough: Cognitively unable to elicit    Short Term Goals: Week 1: SLP Short Term Goal 1 (Week 1): Pt will utilize multimodal communication (as needed) to express simple wants/needs with maxA SLP Short Term Goal 2 (Week 1): Pt will complete functional naming tasks w/ maxA SLP Short Term Goal 3 (Week 1): Pt will comprehend mildly complex conversation w/ maxA SLP Short Term Goal 4 (Week 1): Pt will complete pharyngeal strengthening exercises w/ modA SLP Short Term Goal 5 (Week 1): Pt will utilize safe swallow strategies during PO intake with modA SLP Short Term Goal 6 (Week 1): Pt will compensate for L visual inattention during functional tasks with maxA  Refer  to Care Plan for Long Term Goals  Recommendations for other services: Neuropsych and Therapeutic Recreation  Pet therapy  Discharge Criteria: Patient will be discharged from SLP if patient refuses treatment 3 consecutive times without medical reason, if treatment goals not met, if there is a change in medical status, if patient makes no progress towards goals or if patient is discharged from hospital.  The above assessment, treatment plan, treatment alternatives and goals were discussed and mutually agreed upon: by patient and by family  [name] 09/21/2023, 4:26 PM

## 2023-09-21 NOTE — Progress Notes (Signed)
 PMR Admission Coordinator Pre-Admission Assessment   Patient: Alan Wise is an 53 y.o., male MRN: [id number] DOB: Aug 31, 1970 Height: 5\' 8"  (172.7 cm) Weight: 67.2 kg   Insurance Information HMO:     PPO:      PCP:      IPA:      80/20:      OTHER: Choice POS II, group [phone] PRIMARY: [facility]      Policy#: [id number]      Subscriber: patient Pt. Was approved by [facility] for admission 3/29-4/4 CM Name: Vickey Huger      Phone#: [phone]     Fax#: [phone] Pre-Cert#: [id number]      Employer: [employer] professor Benefits:  Phone #: [phone]     Name: checked on [email or web address] Eff. Date: 06/24/23     Deduct: $1250 [phone])      Out of Pocket Max: [phone] (met [phone])      Life Max: n/a CIR: $300 copay/admission, then 80% coverage      SNF: 80% coverage Outpatient:       Co-Pay: $0 to $52 copay per visit Home Health: 80%      Co-Pay: 20% DME: 80%     Co-Pay: 20% Providers: in network  SECONDARY:       Policy#:      Phone#:    Artist:       Phone#:    The Data processing manager" for patients in Inpatient Rehabilitation Facilities with attached "Privacy Act Statement-Health Care Records" was provided and verbally reviewed with: Patient and Family   Emergency Contact Information Contact Information       Name Relation Home Work Mobile    [name],[name] Spouse     [phone]    [name],[name] Mother [phone]   [phone]         Other Contacts   None on File        Current Medical History  Patient Admitting Diagnosis: R CVA   History of Present Illness: A 53 y.o. 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 Patient was recently seen on 09/09/2022 at [facility] ED for a breakthrough seizure  as well as  acute stroke. Per wife, since discharge he has been confused, dizzy and has trouble walking. This morning he was not able to take his meds and therefore was seen by Dr Barbaraann Cao via tele. He recommended admission for repeat MRI as well as overnight video EEG. Wife states he had an episode of right leg jerking for about a minute while coming back from MRI and isnt sure if that was another seizure.  Patient was seen in the [facility] ED on 09/14/23 and then was admitted.  PT/OT/SLP evaluations completed with recommendations for acute inpatient rehab admission.     Complete NIHSS TOTAL: 6   Patient's medical record from [facility] has been reviewed by the rehabilitation admission coordinator and physician.   Past Medical History      Past Medical History:  Diagnosis Date   Allergy     Complication of anesthesia      pt reports he is starting to have more difficulty arousing after surgery   Diabetes mellitus (HCC)     GERD (gastroesophageal reflux disease)     Headache     History of kidney stones  Hyperlipidemia     Renal disorder      kidney stones          Has the patient had major surgery during 100 days prior to admission? No   Family History   family history includes Benign prostatic hyperplasia in his father; Diabetes in his father and maternal grandmother; Hyperlipidemia in his father; Hypertension in his father and paternal grandmother; Stroke in his maternal grandmother and paternal grandmother.   Current Medications  Current Medications    Current Facility-Administered Medications:    acetaminophen (TYLENOL) tablet 650 mg, 650 mg, Oral, Q6H PRN **OR** acetaminophen (TYLENOL) suppository 650 mg, 650 mg, Rectal, Q6H PRN, Lewie Chamber, MD   amLODipine (NORVASC) tablet 10 mg, 10 mg, Oral, Daily, Alanda Slim, Taye T, MD, 10 mg at 09/18/23 1006   apixaban (ELIQUIS) tablet 5 mg, 5 mg, Oral, BID, Hammons, Kimberly B, RPH, 5 mg at 09/18/23 1006   atorvastatin  (LIPITOR) tablet 40 mg, 40 mg, Oral, Daily, Alanda Slim, Taye T, MD, 40 mg at 09/18/23 1007   [COMPLETED] cyanocobalamin (VITAMIN B12) injection 1,000 mcg, 1,000 mcg, Intramuscular, Once, 1,000 mcg at 09/17/23 0846 **FOLLOWED BY** cyanocobalamin (VITAMIN B12) tablet 1,000 mcg, 1,000 mcg, Oral, Daily, Alanda Slim, Taye T, MD, 1,000 mcg at 09/18/23 1007   feeding supplement (ENSURE ENLIVE / ENSURE PLUS) liquid 237 mL, 237 mL, Oral, BID BM, Lewie Chamber, MD, 237 mL at 09/17/23 0936   insulin aspart (novoLOG) injection 0-5 Units, 0-5 Units, Subcutaneous, QHS, Gonfa, Taye T, MD   insulin aspart (novoLOG) injection 0-9 Units, 0-9 Units, Subcutaneous, TID WC, Gonfa, Taye T, MD, 1 Units at 09/18/23 8295   lacosamide (VIMPAT) 150 mg in sodium chloride 0.9 % 25 mL IVPB, 150 mg, Intravenous, BID PRN, Candelaria Stagers T, MD   lamoTRIgine (LAMICTAL) tablet 150 mg, 150 mg, Oral, q morning, 150 mg at 09/18/23 1005 **AND** lamoTRIgine (LAMICTAL) tablet 200 mg, 200 mg, Oral, QHS, Yadav, Priyanka O, MD, 200 mg at 09/17/23 2220   losartan (COZAAR) tablet 25 mg, 25 mg, Oral, Daily, Gonfa, Taye T, MD, 25 mg at 09/18/23 1005   propranolol (INDERAL) tablet 20 mg, 20 mg, Oral, BID, Gonfa, Taye T, MD, 20 mg at 09/18/23 1007   sodium chloride flush (NS) 0.9 % injection 3 mL, 3 mL, Intravenous, Q12H, Lewie Chamber, MD, 3 mL at 09/18/23 1008     Patients Current Diet:  Diet Order                  Diet regular Room service appropriate? Yes with Assist; Fluid consistency: Thin  Diet effective now                         Precautions / Restrictions Precautions Precautions: Fall Restrictions Weight Bearing Restrictions Per Provider Order: No    Has the patient had 2 or more falls or a fall with injury in the past year? Yes   Prior Activity Level Limited Community (1-2x/wk): Went out about 2 X a week, not driving   Prior Functional Level Self Care: Did the patient need help bathing, dressing, using the toilet or eating? Needed  some help   Indoor Mobility: Did the patient need assistance with walking from room to room (with or without device)? Needed some help   Stairs: Did the patient need assistance with internal or external stairs (with or without device)? Needed some help   Functional Cognition: Did the patient need help planning regular tasks such as shopping  or remembering to take medications? Needed some help   Patient Information Are you of Hispanic, Latino/a,or Spanish origin?: A. No, not of Hispanic, Latino/a, or Spanish origin What is your race?: A. White Do you need or want an interpreter to communicate with a doctor or health care staff?: 0. No   Patient's Response To:  Health Literacy and Transportation Is the patient able to respond to health literacy and transportation needs?: Yes Health Literacy - How often do you need to have someone help you when you read instructions, pamphlets, or other written material from your doctor or pharmacy?: Always In the past 12 months, has lack of transportation kept you from medical appointments or from getting medications?: No In the past 12 months, has lack of transportation kept you from meetings, work, or from getting things needed for daily living?: No   Journalist, newspaper / Equipment Home Equipment: Shower seat - built in, Hand held shower head (no shower seat built in downstairs)   Prior Device Use: Indicate devices/aids used by the patient prior to current illness, exacerbation or injury? None of the above   Current Functional Level Cognition   Orientation Level: Oriented X4    Extremity Assessment (includes Sensation/Coordination)   Upper Extremity Assessment: LUE deficits/detail LUE Deficits / Details: decreased grip strength, able to flex L shoulder to ~100 degrees with reports pain, drift observed with flexion. Pt denies impairments in sensation LUE:  (incoordination; frequently dropping items; poor in-hand manipulation skills; attempting ot  use functionally; shoulder/elbow/wrist ROM overall WFL for ROM; difficulty opposing thumb to each finger) LUE Sensation: WNL LUE Coordination: decreased fine motor  Lower Extremity Assessment: Defer to PT evaluation     ADLs   Overall ADL's : Needs assistance/impaired Eating/Feeding: Modified independent Grooming: Supervision/safety, Set up, Sitting Upper Body Bathing: Minimal assistance, Sitting Lower Body Bathing: Moderate assistance Upper Body Dressing : Minimal assistance Lower Body Dressing: Moderate assistance Toilet Transfer: Minimal assistance, Ambulation Toileting- Clothing Manipulation and Hygiene: Minimal assistance Tub/ Shower Transfer: Minimal assistance Functional mobility during ADLs: Minimal assistance General ADL Comments: Pt has L eye visual deficits requiring verbal cues for sequencing and to attend to L side during functional mobility. Pt with FM impairments in L hand but was able to use functionally to complete grooming task. CGA provided for safety during functional mobility without use of AD     Mobility   Overal bed mobility: Needs Assistance Bed Mobility: Supine to Sit, Sit to Supine Supine to sit: Supervision, HOB elevated Sit to supine: Supervision, HOB elevated, Contact guard assist General bed mobility comments: Pt completed task with verbal cues to continue scooting foward until B feet were flat on the floor.     Transfers   Overall transfer level: Needs assistance Equipment used: None Transfers: Sit to/from Stand Sit to Stand: Min assist General transfer comment: HHA     Ambulation / Gait / Stairs / Psychologist, prison and probation services   Ambulation/Gait Ambulation/Gait assistance: +2 physical assistance, Min assist, +2 safety/equipment Gait Distance (Feet): 175 Feet (145+30) Assistive device: 2 person hand held assist, Rolling walker (2 wheels) Gait Pattern/deviations: Drifts right/left, Staggering left, Narrow base of support, Decreased stride length, Step-through  pattern General Gait Details: Pt initially able to ambulate to gym with Min A via 2 person HHA. pt noted to be rather unsteady drifting to the L and reliant on external support. Trialed RW and pt still required Min A due to visual deficits for obstacle navigation. Gait velocity: decreased Stairs: Yes Stairs assistance: Min  assist Stair Management: One rail Right, Step to pattern, Forwards Number of Stairs: 4 General stair comments: no LOB noted. Very slow and cautious     Posture / Balance Dynamic Sitting Balance Sitting balance - Comments: static sitting EOB and able to weight, bend/reach to donn socks onto B feet while seated in bed Balance Overall balance assessment: Needs assistance Sitting-balance support: No upper extremity supported, Feet supported Sitting balance-Leahy Scale: Good Sitting balance - Comments: static sitting EOB and able to weight, bend/reach to donn socks onto B feet while seated in bed Standing balance support: During functional activity, Bilateral upper extremity supported Standing balance-Leahy Scale: Poor Standing balance comment: reliant on external support     Special needs/care consideration Diabetic management h/o DM on insulin in acute hospital and Special service needs none    Previous Home Environment (from acute therapy documentation) Living Arrangements: Spouse/significant other, Children (5 and 10 y.o.) Available Help at Discharge: Family, Available 24 hours/day (wife available 24/7 and mother lives close by. good family and friend support) Type of Home: House Home Layout: Two level, Bed/bath upstairs (can live on main level if needed) Alternate Level Stairs-Rails:  (L for first 5 and R on second 5) Alternate Level Stairs-Number of Steps: flight of steps to 2nd floor bedroom Home Access: Stairs to enter (stone steps) Entrance Stairs-Rails: Right, Left Entrance Stairs-Number of Steps: 4 Bathroom Shower/Tub: Health visitor:  Standard Home Care Services: No   Discharge Living Setting Plans for Discharge Living Setting: House, Lives with (comment) (Lives with wife, 12 yo dtr and 5 yo son, 2 dogs, 2 cats) Type of Home at Discharge: House Discharge Home Layout: Two level, Bed/bath upstairs, Able to live on main level with bedroom/bathroom, Full bath on main level Alternate Level Stairs-Rails: Right, Left Alternate Level Stairs-Number of Steps: about 15 steps to upstairs level Discharge Home Access: Stairs to enter Entrance Stairs-Rails: Right, Left Entrance Stairs-Number of Steps: 4 Discharge Bathroom Shower/Tub: Walk-in shower, Door Discharge Bathroom Toilet: Standard Discharge Bathroom Accessibility: Yes How Accessible: Accessible via walker Does the patient have any problems obtaining your medications?: No   Social/Family/Support Systems Patient Roles: Spouse, Parent, Other (Comment) (Has wife, mom close by, and friends) Contact Information: Owens Loffler - wife Anticipated Caregiver: Leotis Shames - wife - Ability/Limitations of Caregiver: Wife does not work, provides care for patient Caregiver Availability: 24/7 Discharge Plan Discussed with Primary Caregiver: Yes Is Caregiver In Agreement with Plan?: Yes Does Caregiver/Family have Issues with Lodging/Transportation while Pt is in Rehab?: No   Goals Patient/Family Goal for Rehab: PT/OT/SLP supervision goals Expected length of stay: 10-12 days Pt/Family Agrees to Admission and willing to participate: Yes Program Orientation Provided & Reviewed with Pt/Caregiver Including Roles  & Responsibilities: Yes   Decrease burden of Care through IP rehab admission: N/A   Possible need for SNF placement upon discharge: Not planned   Patient Condition: I have reviewed medical records from [facility], spoken with CM, and patient and spouse. I met with patient at the bedside for inpatient rehabilitation assessment.  Patient will benefit from ongoing PT, OT, and  SLP, can actively participate in 3 hours of therapy a day 5 days of the week, and can make measurable gains during the admission.  Patient will also benefit from the coordinated team approach during an Inpatient Acute Rehabilitation admission.  The patient will receive intensive therapy as well as Rehabilitation physician, nursing, social worker, and care management interventions.  Due to bladder management, bowel management, safety, skin/wound  care, disease management, medication administration, pain management, and patient education the patient requires 24 hour a day rehabilitation nursing.  The patient is currently Mod A with mobility and basic ADLs.  Discharge setting and therapy post discharge at home with home health is anticipated.  Patient has agreed to participate in the Acute Inpatient Rehabilitation Program and will admit today.   Preadmission Screen Completed By:  Trish Mage, 09/18/2023 11:59 AM ______________________________________________________________________   Discussed status with Dr. Shearon Stalls  on 09/20/23 at 930 and received approval for admission today.   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 Yes Does the patient require coordinated care of a physician, rehab nurse, PT, OT, and SLP to address physical and functional deficits in the context of the above medical diagnosis(es)? Yes Addressing deficits in the following areas: balance, endurance, locomotion, strength, transferring, bowel/bladder control, bathing, dressing, feeding, grooming, toileting, cognition, speech, language, swallowing, and psychosocial support Can the patient actively participate in an intensive therapy program of at least 3 hrs of therapy 5 days a week? Yes The potential for patient to make measurable gains while on inpatient rehab is good Anticipated functional outcomes upon discharge from inpatient rehab: supervision PT, supervision OT, supervision SLP Estimated rehab length of stay to reach the above functional goals is: 10-12 days Anticipated discharge destination: Home 10. Overall Rehab/Functional Prognosis: good     MD Signature:   Angelina Sheriff, DO 09/20/2023

## 2023-09-21 NOTE — Consult Note (Signed)
 NEUROLOGY CONSULT NOTE   Date of service: September 21, 2023 Patient Name: Alan Wise MRN:  [id number] DOB:  1970/07/31 Chief Complaint: "Not taking seizure medications" Requesting Provider: Genice Rouge, MD  History of Present Illness  Alan Wise is a 53 y.o. 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  At that time he was found to have multiple new strokes including: 1) punctate focus in anterior insular cortex and 2) stable 8 mm acute/subacute right cerebral peduncle infarct. He was started on Eliquis 5 mg BID Of note, he was hospitalized from 3/19 - 3/21 for breakthrough seizure (missed only one Lamictal dose).  Neurology was consulted to evaluate patient for medical decision making capacity and IV AED.  On interview, patient shows signs of expressive aphasia, is very difficult understand.  Can outline repeated expressed preference that "for 3 days" he should begin on no medications to "clear out" his body. Is unable to articulate why this would take three days specifically. (Later in interview is willing to try aspirin 81 mg.)  Attempted to explain to patient that many of the drugs in his body, including the Eliquis, has cleared his system by now. Insisted on waiting two additional days "yesterday was night one." Patient then expressed that he would not take Eliquis PO because it made him throw up -- per wife, patient is referring to Vimpat.  Attempted to delineate difference between Eliquis and Vimpat, to which patient repeated "no, no."    On re-interview with attending, patient is exasperated aand says that he is willing to die by seizure or massive CVA "if he has to" rather than take any medications currently. (Per communication  with Richardson Dopp, PA of the palliative care team, this is incongruent with his current goal of 2+ months of end-of-life support, even if he could not interact with the environment). Then he points to the ceiling in front of his bed and says "that is longstanding."  Per discussion with wife, patient was improving and had seizures controlled on medications until he began refusing all medications yesterday. Suspects change in setting to inpatient rehab may have upset him. Is, with good reason, extremely concerned about possibility of seizure or stroke in absence of medications. Is concerned that patient will need higher level of care, which would make ongoing PT untenable. Has reached out to Dr. Barbaraann Cao, patient's neuro-oncologist, for guidance on decision-making going forward.   ROS  Comprehensive ROS performed and pertinent positives documented in HPI   Past History   Past Medical History:  Diagnosis Date   Allergy    Complication of anesthesia    pt reports he is starting to have more difficulty arousing after surgery   Diabetes mellitus (HCC)    GERD (gastroesophageal reflux disease)    Headache    History of kidney stones    Hyperlipidemia    Renal disorder    kidney stones    Past Surgical History:  Procedure Laterality Date   APPLICATION OF CRANIAL NAVIGATION Right 01/17/2021   Procedure: APPLICATION OF CRANIAL NAVIGATION;  Surgeon: Bedelia Person, MD;  Location: [facility] OR;  Service: Neurosurgery;  Laterality: Right;   COLONOSCOPY  09/03/1994   COLONOSCOPY WITH PROPOFOL N/A 01/07/2023   Procedure: COLONOSCOPY WITH PROPOFOL;  Surgeon: [name], MD;  Location: [facility] ENDOSCOPY;  Service:  Endoscopy;  Laterality: N/A;  NOT TOO EARLY   CYSTOSCOPY WITH STENT PLACEMENT Right 01/25/2016   Procedure: CYSTOSCOPY WITH STENT PLACEMENT;  Surgeon: Hildred Laser, MD;  Location: [facility] ORS;  Service: Urology;  Laterality: Right;   FRAMELESS  BIOPSY WITH BRAINLAB Right 01/17/2021    Procedure: RIGHT STEREOTACTIC BIOPSY OF INSULAR LESION;  Surgeon: Bedelia Person, MD;  [facility]: [facility] OR;  Service: Neurosurgery;  Laterality: Right;   KNEE ARTHROSCOPY W/ MENISCAL REPAIR Right 06/23/1988   POLYPECTOMY  01/07/2023   Procedure: POLYPECTOMY;  Surgeon: [name], MD;  Location: [facility] ENDOSCOPY;  Service: Endoscopy;;   removal of birthmark  06/08/2013   SKIN CANCER EXCISION  06/23/2012   TYMPANOSTOMY TUBE PLACEMENT  08/06/1978   URETEROSCOPY WITH HOLMIUM LASER LITHOTRIPSY Right 01/25/2016   Procedure: URETEROSCOPY WITH HOLMIUM LASER LITHOTRIPSY;  Surgeon: Hildred Laser, MD;  Location: [facility] ORS;  Service: Urology;  Laterality: Right;   URETHRAL STRICTURE DILATATION     visual inspection of vocal cord     1973   WISDOM TOOTH EXTRACTION      Family History: Family History  Problem Relation Age of Onset   Hyperlipidemia Father    Hypertension Father    Diabetes Father    Benign prostatic hyperplasia Father    Stroke Maternal Grandmother    Diabetes Maternal Grandmother    Stroke Paternal Grandmother    Hypertension Paternal Grandmother    Kidney disease Neg Hx    Prostate cancer Neg Hx     Social History  reports that he has never smoked. He has never used smokeless tobacco. He reports that he does not currently use alcohol. He reports that he does not use drugs.  Allergies  Allergen Reactions   Contrast Media [Iodinated Contrast Media] Swelling    Medications   Current Facility-Administered Medications:    acetaminophen (TYLENOL) tablet 325-650 mg, 325-650 mg, Oral, Q4H PRN, Elijah Birk C, DO   acetaminophen (TYLENOL) tablet 650 mg, 650 mg, Oral, Q6H PRN, Shearon Stalls, Morgan C, DO   alum & mag hydroxide-simeth (MAALOX/MYLANTA) 200-200-20 MG/5ML suspension 30 mL, 30 mL, Oral, Q4H PRN, Shearon Stalls, Morgan C, DO   amLODipine (NORVASC) tablet 10 mg, 10 mg, Oral, Daily, Engler, Morgan C, DO   apixaban (ELIQUIS) tablet 5 mg, 5 mg, Oral, BID, Engler, Morgan C, DO    atorvastatin (LIPITOR) tablet 40 mg, 40 mg, Oral, Daily, Engler, Morgan C, DO   bisacodyl (DULCOLAX) suppository 10 mg, 10 mg, Rectal, Daily PRN, Elijah Birk C, DO   cholecalciferol (VITAMIN D3) 25 MCG (1000 UNIT) tablet 1,000 Units, 1,000 Units, Oral, Daily, Angelina Sheriff, DO, 1,000 Units at 09/20/23 1748   cyanocobalamin (VITAMIN B12) tablet 1,000 mcg, 1,000 mcg, Oral, Daily, Engler, Morgan C, DO   feeding supplement (ENSURE ENLIVE / ENSURE PLUS) liquid 237 mL, 237 mL, Oral, BID BM, Engler, Morgan C, DO   guaiFENesin-dextromethorphan (ROBITUSSIN DM) 100-10 MG/5ML syrup 5-10 mL, 5-10 mL, Oral, Q6H PRN, Shearon Stalls, Morgan C, DO   insulin aspart (novoLOG) injection 0-5 Units, 0-5 Units, Subcutaneous, QHS, Engler, Morgan C, DO   insulin aspart (novoLOG) injection 0-9 Units, 0-9 Units, Subcutaneous, TID WC, Engler, Morgan C, DO   lamoTRIgine (LAMICTAL) tablet 150 mg, 150 mg, Oral, q morning **AND** lamoTRIgine (LAMICTAL) tablet 200 mg, 200 mg, Oral, QHS, Engler, Morgan C, DO   LORazepam (ATIVAN) injection 1-2 mg, 1-2 mg, Intravenous, PRN, Elijah Birk C, DO   losartan (COZAAR) tablet 25 mg, 25 mg, Oral, Daily, Elijah Birk C, DO  melatonin tablet 3 mg, 3 mg, Oral, QHS PRN, Shearon Stalls, Morgan C, DO   polyethylene glycol (MIRALAX / GLYCOLAX) packet 17 g, 17 g, Oral, Daily PRN, Angelina Sheriff, DO   propranolol (INDERAL) tablet 20 mg, 20 mg, Oral, BID, Engler, Morgan C, DO   sodium phosphate (FLEET) enema 1 enema, 1 enema, Rectal, Once PRN, Angelina Sheriff, DO  Vitals   Vitals:   09/20/23 1500 09/20/23 1948 09/21/23 0547 09/21/23 0618  BP:  139/79 (!) 155/88   Pulse: 64 70 63   Resp: 16 18 18    Temp: 97.9 F (36.6 C) 98 F (36.7 C) (!) 97.5 F (36.4 C)   TempSrc: Oral Oral Oral   SpO2: 100% 98% 98%   Weight:    64.5 kg  Height: 5\' 8"  (1.727 m)       Body mass index is 21.62 kg/m.  Physical Exam   Constitutional: Appears well-developed and well-nourished.  Psych: Difficult to  assess due to aphasia.  Neurologic Examination   Mental Status -patient is alert and oriented to self, wife, location and general situation.  Exam limited by expressive and receptive aphasia.  Cranial Nerves II - XII - II -left, numbness hemianopia III, IV, VI - Extraocular movements grossly Motor Strength - The patient's strength was not assessed. No fasciculations were present.  Motor Tone - Muscle tone was assessed at the neck and appendages and was normal. Reflexes - Not assessed Sensory - Not assessed Coordination - Tremor was absent. Gait and Station - deferred.   Labs/Imaging/Neurodiagnostic studies   CBC:  Recent Labs  Lab September 22, 2023 0531 09/21/23 0540  WBC 8.0 9.9  NEUTROABS  --  6.3  HGB 15.6 15.7  HCT 43.6 44.7  MCV 83.5 83.2  PLT 195 205   Basic Metabolic Panel:  Lab Results  Component Value Date   NA 140 09/21/2023   K 4.3 09/21/2023   CO2 25 09/21/2023   GLUCOSE 102 (H) 09/21/2023   BUN 13 09/21/2023   CREATININE 1.03 09/21/2023   CALCIUM 9.6 09/21/2023   GFRNONAA >60 09/21/2023   GFRAA 123 12/05/2020   Lipid Panel:  Lab Results  Component Value Date   LDLCALC 156 (H) 09/10/2023   HgbA1c:  Lab Results  Component Value Date   HGBA1C 5.4 09-22-2023   Urine Drug Screen: No results found for: "LABOPIA", "COCAINSCRNUR", "LABBENZ", "AMPHETMU", "THCU", "LABBARB"  Alcohol Level     Component Value Date/Time   ETH <10 05/13/2023 2116   INR  Lab Results  Component Value Date   INR 1.0 05/13/2023   APTT  Lab Results  Component Value Date   APTT 29 05/13/2023   AED levels:  Lab Results  Component Value Date   LAMOTRIGINE 10.8 09/14/2023    CT Head without contrast: 1. No hemorrhage or CT evidence of an acute cortical infarct. 2. Redemonstrated post-treatment changes in the right frontal lobe, which are better assessed on recent prior brain MRI dated 05/05/2023.  MR Angio head without contrast and Carotid Duplex BL: Normal MRA of the  head and neck. No large vessel occlusion, hemodynamically significant stenosis, or other acute vascular abnormality.    MRI Brain: 1. New punctate focus of restricted diffusion in the anterior insular cortex compatible with a new acute/subacute infarct. 2. 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 Tumor is considered less likely. 3. Stable restricted diffusion in the anterior right frontal lobe and along the atrium of the right lateral ventricle consistent with treated  tumor. 4. Improved diffusion changes within the genu of the corpus callosum suggesting these were ischemic. 5. Stable chronic encephalomalacia of the right frontal operculum. 6. Stable periventricular T2 hyperintensities in the left hemisphere. This likely reflects the sequela of chronic microvascular ischemia.  Neurodiagnostics rEEG:  This study is suggestive of cortical dysfunction arising from ight hemisphere, maximal right temporal region likely secondary to underlying structural abnormality. No seizures or epileptiform discharges were seen throughout the recording.   ASSESSMENT   Alan Wise is a 53 y.o. 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 Neurology was consulted to help assess capacity and recommend appropriate anti-epileptic medication usage.  After extensive discussion with primary team, palliative care, and patient's wife, it is my opinion that patient does not currently possess the capacity to refuse antiepileptic medication for seizure at this time. Although he suffers from expressive aphasia, patient is admittedly capable of articulating his preference to stop all medication for three days, repeatedly and vigorously.  He was, however unable to explain the following:  What *benefit* he would gain by stopping all  of his medications for three days, even though he acknowledges that this greatly increases his risk of seizure. Why he has chosen three days as his threshold for restarting his seizure medication, despite being told that certain medications (I.e. Eliquis) have a short half life and have virtually cleared his system over the past 24 hours.  Why he took his medications earlier in stay and now does not now want to.  Why he would accept aspirin but not other medications.  Additionally, he appears unable to differentiate between his medications (believed Eliquis caused previous nausea instead of Vimpat) and does not appear to comprehend or care about these differences when it is explained to him.   RECOMMENDATIONS  - Agree that patient does not currently have capacity to refuse anti-epileptic seizure medications  - Give IV depakote 2000mg  as a one-time loading dose for seizure prophylaxis - Start IV depakote 500 mg TID therafter - Check depakote level 4 days following initiation. Lab should be drawn shortly before early AM dose. - Gave Aspirin 325 mg x1 today and ordered aspirin 81 mg qday afterwards.  - Neurology will continue to follow.  ______________________________________________________________________  Sharol Given, MD [facility], PGY-1

## 2023-09-21 NOTE — Progress Notes (Signed)
 Patient agitated at first part of shift. Food brought in from family. Wife feels that patient's Mom gets him agitated. Per wife, Mom told patient he was taking too much medicine. Attempted to give scheduled meds, lamictal, inderal, and eliquis. Patient adamantly refused, "I'm taking a 3 day break." Explained rationale of meds and the importance of taking them. Yelled "NO" when attempted to give meds. Waited approximately 45 minutes to see if patient calmed down before attempt to give meds again. Continues to refuse and get agitated when  encouraged to take them. Would not take from wife either. Paged [place], R/T med refusals. Intermittent  Coughing and clearing throat when eating and while lying down. Wife reports patient takes meds better, whole in AS. Wife at bedside. Alfredo Martinez A

## 2023-09-21 NOTE — Progress Notes (Signed)
 Pt refused all of his night time meds. Pt stated "I am giving my body time to come down from all of the things that has happened. I will take one medication on day 3 and each day after that I will increase how many meds I take" Pt agreed to let staff take his bp and bs. Pt is in bed, watching tv, bed alarm on, wife at bedside.  Feliberto Gottron, LPN

## 2023-09-21 NOTE — Evaluation (Signed)
 Occupational Therapy Assessment and Plan  Patient Details  Name: Alan Wise MRN: [id number] Date of Birth: [birth year]  OT Diagnosis: abnormal posture, cognitive deficits, disturbance of vision, and muscle weakness (generalized) Rehab Potential: Rehab Potential (ACUTE ONLY): Good ELOS: 10-12 days   Today's Date: 09/21/2023 OT Individual Time: 8413-2440 OT Individual Time Calculation (min): 70 min     Hospital Problem: Principal Problem:   CVA (cerebral vascular accident) [facility])   Past Medical History:  Past Medical History:  Diagnosis Date   Allergy    Complication of anesthesia    pt reports he is starting to have more difficulty arousing after surgery   Diabetes mellitus (HCC)    GERD (gastroesophageal reflux disease)    Headache    History of kidney stones    Hyperlipidemia    Renal disorder    kidney stones   Past Surgical History:  Past Surgical History:  Procedure Laterality Date   APPLICATION OF CRANIAL NAVIGATION Right 01/17/2021   Procedure: APPLICATION OF CRANIAL NAVIGATION;  Surgeon: Bedelia Person, MD;  Location: [facility] OR;  Service: Neurosurgery;  Laterality: Right;   COLONOSCOPY  09/03/1994   COLONOSCOPY WITH PROPOFOL N/A 01/07/2023   Procedure: COLONOSCOPY WITH PROPOFOL;  Surgeon: [name], MD;  Location: [facility] ENDOSCOPY;  Service: Endoscopy;  Laterality: N/A;  NOT TOO EARLY   CYSTOSCOPY WITH STENT PLACEMENT Right 01/25/2016   Procedure: CYSTOSCOPY WITH STENT PLACEMENT;  Surgeon: Hildred Laser, MD;  Location: [facility] ORS;  Service: Urology;  Laterality: Right;   FRAMELESS  BIOPSY WITH BRAINLAB Right 01/17/2021   Procedure: RIGHT STEREOTACTIC BIOPSY OF INSULAR LESION;  Surgeon: Bedelia Person, MD;  Location: [facility] OR;  Service: Neurosurgery;  Laterality: Right;   KNEE ARTHROSCOPY W/ MENISCAL REPAIR Right 06/23/1988   POLYPECTOMY  01/07/2023   Procedure: POLYPECTOMY;  Surgeon: [name], MD;  Location: [facility] ENDOSCOPY;  Service: Endoscopy;;    removal of birthmark  06/08/2013   SKIN CANCER EXCISION  06/23/2012   TYMPANOSTOMY TUBE PLACEMENT  08/06/1978   URETEROSCOPY WITH HOLMIUM LASER LITHOTRIPSY Right 01/25/2016   Procedure: URETEROSCOPY WITH HOLMIUM LASER LITHOTRIPSY;  Surgeon: Hildred Laser, MD;  Location: [facility] ORS;  Service: Urology;  Laterality: Right;   URETHRAL STRICTURE DILATATION     visual inspection of vocal cord     1973   WISDOM TOOTH EXTRACTION      Assessment & Plan Clinical Impression:  Alan Wise is a 53 y.o. year old male  who  has a past medical history of Allergy, Complication of anesthesia, Diabetes mellitus (HCC), GERD (gastroesophageal reflux disease), Headache, History of kidney stones, Hyperlipidemia, and Renal disorder.  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  They presented to the hospital 09/14/23 with worsening gait, left-sided weakness, and new onset dysphagia.  Of note, he was recently hospitalized at home announce from 3-19 to 3-21 with new CVA and breakthrough seizures.  In the ER, he was started on IV fluids and MRI brain showed new punctate focus of restricted diffusion in the anterior insular cortex compatible with a new acute/subacute infarct, stable 8 mm acute/subacute infarct involving the right cerebral peduncle, stable restricted diffusion in the anterior right frontal lobe and along the atrium of the right lateral ventricle consistent with treated tumor, improved diffusion changes within the genu of the corpus callosum suggesting these were ischemic, stable chronic encephalomalacia of the right frontal operculum, and stable periventricular T2 hyperintensities in the left Hemisphere likely reflects the sequela of chronic microvascular ischemia.  He was started on Eliquis, AEDs were adjusted, and admitted with evaluation by neurology and Dr. Barbaraann Cao with neuro-oncology, cause of recent decline determined to be multifactorial secondary to radiation necrosis,  breakthrough seizures, and multiple recent strokes.  Hospitalization was complicated by dysphagia, which GI determined to be neurologic.  Last known seizure was 3-26, with subsequent increasing nightly Lamictal.  He was determined stable by the primary team and admitted to inpatient rehab 3-30. Patient transferred to CIR on 09/20/2023 .    Patient currently requires min with basic self-care skills secondary to muscle weakness, decreased cardiorespiratoy endurance, decreased coordination and decreased motor planning, hemianopsia, decreased awareness, decreased problem solving, decreased safety awareness, and decreased memory, and decreased standing balance.  Prior to hospitalization, patient could complete self-care independently.  Patient will benefit from skilled intervention to decrease level of assist with basic self-care skills and increase independence with basic self-care skills prior to discharge home with care partner.  Anticipate patient will require 24 hour supervision and follow up outpatient.  OT - End of Session Activity Tolerance: Tolerates 10 - 20 min activity with multiple rests Endurance Deficit: Yes Endurance Deficit Description: Breaks needed during functional mobility OT Assessment Rehab Potential (ACUTE ONLY): Good OT Barriers to Discharge: Home environment access/layout;Medication compliance OT Patient demonstrates impairments in the following area(s): Balance;Cognition;Endurance;Motor;Safety;Sensory;Vision OT Basic ADL's Functional Problem(s): Eating;Grooming;Bathing;Dressing;Toileting OT Transfers Functional Problem(s): Toilet;Tub/Shower OT Additional Impairment(s): None OT Plan OT Intensity: Minimum of 1-2 x/day, 45 to 90 minutes OT Frequency: 5 out of 7 days OT Duration/Estimated Length of Stay: 10-12 days OT Treatment/Interventions: Balance/vestibular training;Discharge planning;Pain management;Self Care/advanced ADL retraining;Therapeutic Activities;UE/LE Coordination  activities;Cognitive remediation/compensation;Disease mangement/prevention;Functional mobility training;Patient/family education;Skin care/wound managment;Therapeutic Exercise;Visual/perceptual remediation/compensation;DME/adaptive equipment instruction;Neuromuscular re-education;Psychosocial support;UE/LE Strength taining/ROM OT Self Feeding Anticipated Outcome(s): Supervision OT Basic Self-Care Anticipated Outcome(s): Supervision OT Toileting Anticipated Outcome(s): Supervision OT Bathroom Transfers Anticipated Outcome(s): Supervision OT Recommendation Recommendations for Other Services: Therapeutic Recreation consult Therapeutic Recreation Interventions: Pet therapy Patient destination: Home Follow Up Recommendations: 24 hour supervision/assistance;Outpatient OT Equipment Recommended: To be determined   OT Evaluation Precautions/Restrictions  Precautions Precautions: Fall Precaution/Restrictions Comments: seizures, prior CVA, expressive aphasia, monitor vitals Restrictions Weight Bearing Restrictions Per Provider Order: No Home Living/Prior Functioning Home Living Family/patient expects to be discharged to:: Private residence Living Arrangements: Spouse/significant other, Children Available Help at Discharge: Family, Available 24 hours/day Type of Home: House Home Access: Stairs to enter (stone steps) Entrance Stairs-Number of Steps: 4 Entrance Stairs-Rails: Right, Left Home Layout: Two level, Able to live on main level with bedroom/bathroom (can live on main level) Alternate Level Stairs-Number of Steps: flight of steps to 2nd floor bedroom Alternate Level Stairs-Rails:  (L for first 5 and R on second 5) Bathroom Shower/Tub: Psychologist, counselling, Sport and exercise psychologist: Standard Bathroom Accessibility: Yes  Lives With: Spouse (2 small children) IADL History Homemaking Responsibilities: No Occupation: Full time employment Prior Function Level of Independence: Independent with basic  ADLs, Independent with transfers, Independent with gait, Independent with homemaking with ambulation  Able to Take Stairs?: Yes Driving: No (not driving for several years due to seizures) Vision Baseline Vision/History: 1 Wears glasses Ability to See in Adequate Light: 1 Impaired Patient Visual Report: Peripheral vision impairment Vision Assessment?: Wears glasses for reading Eye Alignment: Within Functional Limits Alignment/Gaze Preference: Within Defined Limits Visual Fields: Left homonymous hemianopsia Additional Comments: L peripheral vision decr since January Perception  Perception: Impaired Perception-Other Comments: L; poor awareness Praxis Praxis: Impaired Praxis Impairment Details: Perseveration;Organization Cognition Cognition Overall Cognitive Status: Impaired/Different from baseline Arousal/Alertness: Awake/alert  Orientation Level: Person;Place;Situation Person: Oriented Place: Oriented (when given choices) Situation: Disoriented Memory: Impaired Awareness: Impaired Problem Solving: Impaired Safety/Judgment: Impaired Brief Interview for Mental Status (BIMS) Repetition of Three Words (First Attempt): No answer (difficult to assess due to expressive aphasia) Temporal Orientation: Year: No answer Temporal Orientation: Month: No answer Temporal Orientation: Day: No answer Recall: "Sock": No answer Recall: "Blue": No answer Recall: "Bed": No answer BIMS Summary Score: 99 Sensation Sensation Light Touch: Impaired by gross assessment Hot/Cold: Not tested Proprioception: Impaired by gross assessment Stereognosis: Not tested Additional Comments: Difficult to assess light touch during formal testing due to pt's inability to follow commands of "eyes closed" Coordination Gross Motor Movements are Fluid and Coordinated: No Fine Motor Movements are Fluid and Coordinated: No Motor  Motor Motor: Hemiplegia Motor - Skilled Clinical Observations: L hemiparesis   Trunk/Postural Assessment  Cervical Assessment Cervical Assessment: Exceptions to [facility] (forward head) Thoracic Assessment Thoracic Assessment: Exceptions to [facility] (rounded shoulders) Lumbar Assessment Lumbar Assessment: Exceptions to [facility] (posterior pelvic tilt in sitting) Postural Control Postural Control: Deficits on evaluation Righting Reactions: delayed on L Protective Responses: inadequate  Balance Balance Balance Assessed: Yes Static Sitting Balance Static Sitting - Balance Support: Feet supported;No upper extremity supported Static Sitting - Level of Assistance: 6: Modified independent (Device/Increase time) Dynamic Sitting Balance Dynamic Sitting - Balance Support: Feet supported;No upper extremity supported;During functional activity Dynamic Sitting - Level of Assistance: 5: Stand by assistance (supervision) Static Standing Balance Static Standing - Balance Support: During functional activity;No upper extremity supported Static Standing - Level of Assistance: 5: Stand by assistance (CGA) Dynamic Standing Balance Dynamic Standing - Balance Support: During functional activity;No upper extremity supported Dynamic Standing - Level of Assistance: 4: Min assist Extremity/Trunk Assessment RUE Assessment RUE Assessment: Within Functional Limits LUE Assessment LUE Assessment: Exceptions to [facility] Active Range of Motion (AROM) Comments: WNL General Strength Comments: 4-/5 grossly  Care Tool Care Tool Self Care Eating   Eating Assist Level: Minimal Assistance - Patient > 75%    Oral Care    Oral Care Assist Level: Supervision/Verbal cueing    Bathing   Body parts bathed by patient: Right arm;Left arm;Chest;Front perineal area;Abdomen;Right upper leg;Left upper leg;Right lower leg;Left lower leg;Face Body parts bathed by helper: Buttocks   Assist Level: Minimal Assistance - Patient > 75%    Upper Body Dressing(including orthotics)   What is the patient wearing?: Pull over  shirt   Assist Level: Minimal Assistance - Patient > 75%    Lower Body Dressing (excluding footwear)   What is the patient wearing?: Pants Assist for lower body dressing: Contact Guard/Touching assist    Putting on/Taking off footwear   What is the patient wearing?: Non-skid slipper socks Assist for footwear: Supervision/Verbal cueing       Care Tool Toileting Toileting activity   Assist for toileting: Minimal Assistance - Patient > 75%     Care Tool Bed Mobility Roll left and right activity        Sit to lying activity        Lying to sitting on side of bed activity         Care Tool Transfers Sit to stand transfer   Sit to stand assist level: Contact Guard/Touching assist    Chair/bed transfer         Toilet transfer   Assist Level: Minimal Assistance - Patient > 75%     Care Tool Cognition  Expression of Ideas and Wants Expression of Ideas and Wants: 2.  Frequent difficulty - frequently exhibits difficulty with expressing needs and ideas  Understanding Verbal and Non-Verbal Content Understanding Verbal and Non-Verbal Content: 3. Usually understands - understands most conversations, but misses some part/intent of message. Requires cues at times to understand   Memory/Recall Ability Memory/Recall Ability : Current season;That he or she is in a hospital/hospital unit   Refer to Care Plan for Long Term Goals  SHORT TERM GOAL WEEK 1 OT Short Term Goal 1 (Week 1): Pt will complete toilet transfer with CGA OT Short Term Goal 2 (Week 1): Pt will attend to L visual field with min questioning cues 75% of time OT Short Term Goal 3 (Week 1): Pt will complete 3/3 toileting steps with CGA  Recommendations for other services: Therapeutic Recreation  Pet therapy   Skilled Therapeutic Intervention Patient received upright in bed upon therapy arrival and agreeable to participate in OT evaluation. Education provided on OT purpose, therapy schedule, goals for therapy, and  safety policy while in rehab. No pain reported. Wife present for part of session, however of note- pt with increased frustration when wife would speak for him and also when struggling to locate intended word during communication due to expressive aphasia. Also of note- pt refused meds this AM and was perseverative on "not taking the meds for 3 days" despite education on risks. OT relayed wife's concerns of pt's medical stability while being unmediated however MD and nursing already aware and have been in discussion with wife.  Patient demonstrates cognitive impairments (problem solving, awareness, safety), dynamic standing balance, functional endurance and L homonymous hemianopsia with severe L inattention/visual neglect resulting in difficulty completing BADL tasks without increased physical assist. Cues needed throughout for sequencing, safety and redirection when perseverative verbally on triggering topics such as details regarding the stairs in his home and "no meds for 3 days." Pt will benefit from skilled OT services to focus on mentioned deficits. ADLs completed at sink side. See below for ADL and functional transfer performance. Ambulated in hallway about 50 ft without AD, CGA, however pt unable to locate room on L side of body and needed max cueing to attend to. Pt remained upright in bed at conclusion of session with bed alarm on and all needs met at end of session.    ADL ADL Eating: Minimal assistance Where Assessed-Eating: Wheelchair Grooming: Supervision/safety Where Assessed-Grooming: Sitting at sink Upper Body Bathing: Supervision/safety Where Assessed-Upper Body Bathing: Sitting at sink Lower Body Bathing: Minimal assistance Where Assessed-Lower Body Bathing: Sitting at sink;Standing at sink Upper Body Dressing: Minimal assistance Where Assessed-Upper Body Dressing: Sitting at sink Lower Body Dressing: Contact guard Where Assessed-Lower Body Dressing: Sitting at sink;Standing at  sink Toileting: Minimal assistance Where Assessed-Toileting: Teacher, adult education: Curator Method: Proofreader: Raised toilet seat Tub/Shower Transfer: Unable to assess Tub/Shower Transfer Method: Unable to assess Film/video editor: Unable to assess Film/video editor Method: Unable to assess Mobility  Bed Mobility Bed Mobility: Left Sidelying to Sit Left Sidelying to Sit: Supervision/Verbal cueing Transfers Sit to Stand: Contact Guard/Touching assist   Discharge Criteria: Patient will be discharged from OT if patient refuses treatment 3 consecutive times without medical reason, if treatment goals not met, if there is a change in medical status, if patient makes no progress towards goals or if patient is discharged from hospital.  The above assessment, treatment plan, treatment alternatives and goals were discussed and mutually agreed upon: by patient and by family  Melvyn Novas, MS, OTR/L  09/21/2023, 12:24 PM

## 2023-09-21 NOTE — Discharge Instructions (Signed)
 Inpatient Rehab Discharge Instructions  Alan Wise Discharge date and time: No discharge date for patient encounter.   Activities/Precautions/ Functional Status: Activity: activity as tolerated Diet: diabetic diet Wound Care: Routine skin checks Functional status:  ___ No restrictions     ___ Walk up steps independently ___ 24/7 supervision/assistance   ___ Walk up steps with assistance ___ Intermittent supervision/assistance  ___ Bathe/dress independently ___ Walk with walker     _x__ Bathe/dress with assistance ___ Walk Independently    ___ Shower independently ___ Walk with assistance    ___ Shower with assistance ___ No alcohol     ___ Return to work/school ________  Special Instructions: No driving smoking or alcoholSTROKE/TIA DISCHARGE INSTRUCTIONS SMOKING Cigarette smoking nearly doubles your risk of having a stroke & is the single most alterable risk factor  If you smoke or have smoked in the last 12 months, you are advised to quit smoking for your health. Most of the excess cardiovascular risk related to smoking disappears within a year of stopping. Ask you doctor about anti-smoking medications [place] Quit Line: 1-800-QUIT NOW Free Smoking Cessation Classes [phone]  CHOLESTEROL Know your levels; limit fat & cholesterol in your diet  Lipid Panel     Component Value Date/Time   CHOL 207 (H) 09/10/2023 0614   TRIG 116 09/10/2023 0614   HDL 28 (L) 09/10/2023 0614   CHOLHDL 7.4 09/10/2023 0614   VLDL 23 09/10/2023 0614   LDLCALC 156 (H) 09/10/2023 0614   LDLCALC 161 (H) 08/19/2023 0818     Many patients benefit from treatment even if their cholesterol is at goal. Goal: Total Cholesterol (CHOL) less than 160 Goal:  Triglycerides (TRIG) less than 150 Goal:  HDL greater than 40 Goal:  LDL (LDLCALC) less than 100   BLOOD PRESSURE American Stroke Association blood pressure target is less that 120/80 mm/Hg  Your discharge blood pressure is:  BP: 139/79 Monitor your  blood pressure Limit your salt and alcohol intake Many individuals will require more than one medication for high blood pressure  DIABETES (A1c is a blood sugar average for last 3 months) Goal HGBA1c is under 7% (HBGA1c is blood sugar average for last 3 months)  Diabetes:   Lab Results  Component Value Date   HGBA1C 5.4 09/16/2023    Your HGBA1c can be lowered with medications, healthy diet, and exercise. Check your blood sugar as directed by your physician Call your physician if you experience unexplained or low blood sugars.  PHYSICAL ACTIVITY/REHABILITATION Goal is 30 minutes at least 4 days per week  Activity: Increase activity slowly, Therapies: Physical Therapy: Home Health Return to work:  Activity decreases your risk of heart attack and stroke and makes your heart stronger.  It helps control your weight and blood pressure; helps you relax and can improve your mood. Participate in a regular exercise program. Talk with your doctor about the best form of exercise for you (dancing, walking, swimming, cycling).  DIET/WEIGHT Goal is to maintain a healthy weight  Your discharge diet is:  Diet Order             Diet heart healthy/carb modified Room service appropriate? Yes; Fluid consistency: Thin  Diet effective now                   liquids Your height is:  Height: 5\' 8"  (172.7 cm) Your current weight is:   Your Body Mass Index (BMI) is:    Following the type of diet specifically  designed for you will help prevent another stroke. Your goal weight range is:   Your goal Body Mass Index (BMI) is 19-24. Healthy food habits can help reduce 3 risk factors for stroke:  High cholesterol, hypertension, and excess weight.  RESOURCES Stroke/Support Group:  Call [phone]   STROKE EDUCATION PROVIDED/REVIEWED AND GIVEN TO PATIENT Stroke warning signs and symptoms How to activate emergency medical system (call 911). Medications prescribed at discharge. Need for follow-up after  discharge. Personal risk factors for stroke. Pneumonia vaccine given: No Flu vaccine given: No My questions have been answered, the writing is legible, and I understand these instructions.  I will adhere to these goals & educational materials that have been provided to me after my discharge from the hospital.      My questions have been answered and I understand these instructions. I will adhere to these goals and the provided educational materials after my discharge from the hospital.  Patient/Caregiver Signature _______________________________ Date __________  Clinician Signature _______________________________________ Date __________  Please bring this form and your medication list with you to all your follow-up doctor's appointments.

## 2023-09-21 NOTE — Plan of Care (Signed)
  Problem: RH Swallowing Goal: LTG Patient will consume least restrictive diet using compensatory strategies with assistance (SLP) Description: LTG:  Patient will consume least restrictive diet using compensatory strategies with assistance (SLP) Flowsheets (Taken 09/21/2023 1636) LTG: Pt Patient will consume least restrictive diet using compensatory strategies with assistance of (SLP): Minimal Assistance - Patient > 75% Goal: LTG Patient will participate in dysphagia therapy to increase swallow function with assistance (SLP) Description: LTG:  Patient will participate in dysphagia therapy to increase swallow function with assistance (SLP) Flowsheets (Taken 09/21/2023 1636) LTG: Pt will participate in dysphagia therapy to increase swallow function with assistance of (SLP): Minimal Assistance - Patient > 75%   Problem: RH Comprehension Communication Goal: LTG Patient will comprehend basic/complex auditory (SLP) Description: LTG: Patient will comprehend basic/complex auditory information with cues (SLP). Flowsheets (Taken 09/21/2023 1636) LTG: Patient will comprehend: Complex auditory information LTG: Patient will comprehend auditory information with cueing (SLP): Moderate Assistance - Patient 50 - 74% Note: Mildly complex information   Problem: RH Expression Communication Goal: LTG Patient will express needs/wants via multi-modal(SLP) Description: LTG:  Patient will express needs/wants via multi-modal communication (gestures/written, etc) with cues (SLP) Flowsheets (Taken 09/21/2023 1636) LTG: Patient will express needs/wants via multimodal communication (gestures/written, etc) with cueing (SLP): Moderate Assistance - Patient 50 - 74% Goal: LTG Patient will increase word finding of common (SLP) Description: LTG:  Patient will increase word finding of common objects/daily info/abstract thoughts with cues using compensatory strategies (SLP). Flowsheets (Taken 09/21/2023 1636) LTG: Patient will  increase word finding of common (SLP): Moderate Assistance - Patient 50 - 74% Patient will use compensatory strategies to increase word finding of:  Common objects  Daily info   Problem: RH Pre-functional/Other (Specify) Goal: RH LTG SLP (Specify) 1 Description: RH LTG SLP (Specify) 1 Flowsheets (Taken 09/21/2023 1636) LTG: Other SLP (Specify) 1: Pt will compensate for L visual inattention during functional tasks w/ modA

## 2023-09-21 NOTE — Evaluation (Signed)
 Physical Therapy Assessment and Plan  Patient Details  Name: Alan Wise MRN: [id number] Date of Birth: [birth year]  PT Diagnosis: Difficulty walking, Hemiparesis non-dominant, Impaired cognition, and Impaired sensation Rehab Potential: Good ELOS: 10-12 days   Today's Date: 09/21/2023 PT Individual Time: 4098-1191 PT Individual Time Calculation (min): 69 min    Hospital Problem: Principal Problem:   CVA (cerebral vascular accident) [facility])   Past Medical History:  Past Medical History:  Diagnosis Date   Allergy    Complication of anesthesia    pt reports he is starting to have more difficulty arousing after surgery   Diabetes mellitus (HCC)    GERD (gastroesophageal reflux disease)    Headache    History of kidney stones    Hyperlipidemia    Renal disorder    kidney stones   Past Surgical History:  Past Surgical History:  Procedure Laterality Date   APPLICATION OF CRANIAL NAVIGATION Right 01/17/2021   Procedure: APPLICATION OF CRANIAL NAVIGATION;  Surgeon: Bedelia Person, MD;  Location: [facility] OR;  Service: Neurosurgery;  Laterality: Right;   COLONOSCOPY  09/03/1994   COLONOSCOPY WITH PROPOFOL N/A 01/07/2023   Procedure: COLONOSCOPY WITH PROPOFOL;  Surgeon: [name], MD;  Location: [facility] ENDOSCOPY;  Service: Endoscopy;  Laterality: N/A;  NOT TOO EARLY   CYSTOSCOPY WITH STENT PLACEMENT Right 01/25/2016   Procedure: CYSTOSCOPY WITH STENT PLACEMENT;  Surgeon: Hildred Laser, MD;  Location: [facility] ORS;  Service: Urology;  Laterality: Right;   FRAMELESS  BIOPSY WITH BRAINLAB Right 01/17/2021   Procedure: RIGHT STEREOTACTIC BIOPSY OF INSULAR LESION;  Surgeon: Bedelia Person, MD;  Location: [facility] OR;  Service: Neurosurgery;  Laterality: Right;   KNEE ARTHROSCOPY W/ MENISCAL REPAIR Right 06/23/1988   POLYPECTOMY  01/07/2023   Procedure: POLYPECTOMY;  Surgeon: [name], MD;  Location: [facility] ENDOSCOPY;  Service: Endoscopy;;   removal of birthmark  06/08/2013   SKIN CANCER  EXCISION  06/23/2012   TYMPANOSTOMY TUBE PLACEMENT  08/06/1978   URETEROSCOPY WITH HOLMIUM LASER LITHOTRIPSY Right 01/25/2016   Procedure: URETEROSCOPY WITH HOLMIUM LASER LITHOTRIPSY;  Surgeon: Hildred Laser, MD;  Location: [facility] ORS;  Service: Urology;  Laterality: Right;   URETHRAL STRICTURE DILATATION     visual inspection of vocal cord     1973   WISDOM TOOTH EXTRACTION      Assessment & Plan Clinical Impression: Patient is a 53 y.o. year old male with recent admission to the hospital on *** with ***.  Patient transferred to CIR on 09/20/2023 .   Patient currently requires {YNW:2956213} with mobility secondary to {impairments:3041632}.  Prior to hospitalization, patient was {YQM:5784696} with mobility and lived with Spouse (2 small children) in a House home.  Home access is 4Stairs to enter (stone steps).  Patient will benefit from skilled PT intervention to {benefits:22816} for planned discharge {planned discharge:3041670}.  Anticipate patient will {follow EX:5284132} at discharge.  PT - End of Session Activity Tolerance: Tolerates 30+ min activity without fatigue Endurance Deficit: No PT Assessment Rehab Potential (ACUTE/IP ONLY): Good PT Barriers to Discharge: Decreased caregiver support;Home environment access/layout;Lack of/limited family support;Insurance for SNF coverage;Medication compliance;Behavior PT Patient demonstrates impairments in the following area(s): Balance;Behavior;Motor;Perception;Safety PT Transfers Functional Problem(s): Bed to Chair;Car;Furniture;Floor PT Locomotion Functional Problem(s): Ambulation;Stairs PT Plan PT Intensity: Minimum of 1-2 x/day ,45 to 90 minutes PT Frequency: 5 out of 7 days PT Duration Estimated Length of Stay: 10-12 days PT Treatment/Interventions: Ambulation/gait training;Community reintegration;DME/adaptive equipment instruction;Neuromuscular re-education;Psychosocial support;Stair training;UE/LE Strength  taining/ROM;Balance/vestibular training;Discharge planning;Therapeutic Activities;UE/LE Coordination activities;Cognitive  remediation/compensation;Disease management/prevention;Functional mobility training;Patient/family education;Therapeutic Exercise;Visual/perceptual remediation/compensation PT Transfers Anticipated Outcome(s): Mod I/ supervision PT Locomotion Anticipated Outcome(s): supervision PT Recommendation Recommendations for Other Services: None Follow Up Recommendations: Home health PT;Outpatient PT;24 hour supervision/assistance Patient destination: Home Equipment Recommended: To be determined   PT Evaluation Precautions/Restrictions Precautions Precautions: Fall Precaution/Restrictions Comments: seizures, prior CVA, expressive aphasia, monitor vitals,  L field cut Restrictions Weight Bearing Restrictions Per Provider Order: No General   Vital SignsTherapy Vitals Temp: (!) 97.4 F (36.3 C) Pulse Rate: 94 Resp: 16 BP: (!) 138/91 Patient Position (if appropriate): Sitting Oxygen Therapy SpO2: 99 % O2 Device: Room Air Pain Pain Assessment Pain Scale: 0-10 Pain Score: 0-No pain Pain Interference Pain Interference Pain Effect on Sleep: 0. Does not apply - I have not had any pain or hurting in the past 5 days Pain Interference with Therapy Activities: 0. Does not apply - I have not received rehabilitationtherapy in the past 5 days Pain Interference with Day-to-Day Activities: 1. Rarely or not at all Home Living/Prior Functioning Home Living Available Help at Discharge: Family;Available 24 hours/day Type of Home: House Home Access: Stairs to enter (stone steps) Entrance Stairs-Number of Steps: 4 Entrance Stairs-Rails: Right;Left Home Layout: Two level;Able to live on main level with bedroom/bathroom (can live on main level) Alternate Level Stairs-Number of Steps: flight of steps to 2nd floor bedroom Alternate Level Stairs-Rails:  (L for first 5 and R on second  5) Bathroom Shower/Tub: Walk-in shower;Door [employer] Toilet: Standard Bathroom Accessibility: Yes  Lives With: Spouse (2 small children) Prior Function Level of Independence: Independent with basic ADLs;Independent with transfers;Independent with gait;Independent with homemaking with ambulation  Able to Take Stairs?: Yes Driving: No (not driving for several years due to seizures) Vision/Perception  Vision - History Ability to See in Adequate Light: 1 Impaired Vision - Assessment Eye Alignment: Within Functional Limits Alignment/Gaze Preference: Within Defined Limits Perception Perception: Impaired Preception Impairment Details: Inattention/Neglect Perception-Other Comments: poor L awareness Praxis Praxis: Impaired Praxis Impairment Details: Perseveration  Cognition Overall Cognitive Status: Impaired/Different from baseline Arousal/Alertness: Awake/alert Orientation Level: Oriented to person;Oriented to place;Oriented to situation Year: 2025 Month: March Day of Week: Incorrect Attention: Sustained;Focused Focused Attention: Appears intact Sustained Attention: Appears intact Memory: Impaired Awareness: Impaired Problem Solving: Impaired Executive Function: Reasoning;Self Monitoring Reasoning: Impaired Self Monitoring: Impaired Behaviors: Perseveration Safety/Judgment: Impaired Sensation Sensation Light Touch: Impaired by gross assessment Additional Comments: difficult to assess d/t pt's aphasia vs cognition/ understanding vs inability to follow instructions Coordination Gross Motor Movements are Fluid and Coordinated: Yes Fine Motor Movements are Fluid and Coordinated: No Heel Shin Test: slow to perform overall Motor  Motor Motor: Other (comment) Motor - Skilled Clinical Observations: very mild L hemipareisis   Trunk/Postural Assessment  Cervical Assessment Cervical Assessment: Exceptions to [facility] (forward head) Thoracic Assessment Thoracic Assessment: Exceptions  to [facility] (rounded shoulders) Lumbar Assessment Lumbar Assessment: Exceptions to [facility] (posterior pelvic tilt while seated) Postural Control Postural Control: Deficits on evaluation Righting Reactions: delayed on L Protective Responses: inadequate  Balance Balance Balance Assessed: Yes Standardized Balance Assessment Standardized Balance Assessment: Berg Balance Test Berg Balance Test Sit to Stand: Able to stand using hands after several tries Standing Unsupported: Able to stand 2 minutes with supervision Sitting with Back Unsupported but Feet Supported on Floor or Stool: Able to sit safely and securely 2 minutes Stand to Sit: Uses backs of legs against chair to control descent Transfers: Able to transfer safely, definite need of hands Standing Unsupported with Eyes Closed: Able to stand 3 seconds  Standing Ubsupported with Feet Together: Able to place feet together independently but unable to hold for 30 seconds From Standing, Reach Forward with Outstretched Arm: Can reach forward >12 cm safely (5") From Standing Position, Pick up Object from Floor: Able to pick up shoe, needs supervision From Standing Position, Turn to Look Behind Over each Shoulder: Turn sideways only but maintains balance Turn 360 Degrees: Needs close supervision or verbal cueing Standing Unsupported, Alternately Place Feet on Step/Stool: Able to complete 4 steps without aid or supervision Standing Unsupported, One Foot in Front: Able to plae foot ahead of the other independently and hold 30 seconds Standing on One Leg: Able to lift leg independently and hold equal to or more than 3 seconds Total Score: 34 Static Sitting Balance Static Sitting - Balance Support: Feet supported;No upper extremity supported Static Sitting - Level of Assistance: 6: Modified independent (Device/Increase time) Dynamic Sitting Balance Dynamic Sitting - Balance Support: Feet supported;No upper extremity supported;During functional  activity Dynamic Sitting - Level of Assistance: 6: Modified independent (Device/Increase time) Static Standing Balance Static Standing - Balance Support: During functional activity;No upper extremity supported Static Standing - Level of Assistance: Other (comment) (CGA) Dynamic Standing Balance Dynamic Standing - Balance Support: During functional activity;No upper extremity supported Dynamic Standing - Level of Assistance: 4: Min assist Extremity Assessment      RLE Assessment RLE Assessment: Within Functional Limits LLE Assessment LLE Assessment: Within Functional Limits  Care Tool Care Tool Bed Mobility Roll left and right activity   Roll left and right assist level: Independent with assistive device    Sit to lying activity   Sit to lying assist level: Supervision/Verbal cueing    Lying to sitting on side of bed activity   Lying to sitting on side of bed assist level: the ability to move from lying on the back to sitting on the side of the bed with no back support.: Supervision/Verbal cueing     Care Tool Transfers Sit to stand transfer   Sit to stand assist level: Minimal Assistance - Patient > 75%    Chair/bed transfer   Chair/bed transfer assist level: Minimal Assistance - Patient > 75%    Car transfer   Car transfer assist level: Contact Guard/Touching assist      Care Tool Locomotion Ambulation   Assist level: Minimal Assistance - Patient > 75% Assistive device: No Device Max distance: 300 ft  Walk 10 feet activity   Assist level: Contact Guard/Touching assist Assistive device: No Device   Walk 50 feet with 2 turns activity   Assist level: Minimal Assistance - Patient > 75% Assistive device: No Device  Walk 150 feet activity   Assist level: Minimal Assistance - Patient > 75% Assistive device: No Device;Walker-rolling  Walk 10 feet on uneven surfaces activity Walk 10 feet on uneven surfaces activity did not occur: Safety/medical concerns      Stairs    Assist level: Contact Guard/Touching assist Stairs assistive device: 2 hand rails Max number of stairs: 12  Walk up/down 1 step activity   Walk up/down 1 step (curb) assist level: Contact Guard/Touching assist Walk up/down 1 step or curb assistive device: 2 hand rails  Walk up/down 4 steps activity   Walk up/down 4 steps assist level: Contact Guard/Touching assist Walk up/down 4 steps assistive device: 2 hand rails  Walk up/down 12 steps activity   Walk up/down 12 steps assist level: Contact Guard/Touching assist Walk up/down 12 steps assistive device: 2 hand rails  Pick up  small objects from floor   Pick up small object from the floor assist level: Supervision/Verbal cueing    Wheelchair Is the patient using a wheelchair?: Yes (only using w/c for transport - not expected to require on d/c) Type of Wheelchair: Manual   Wheelchair assist level: Dependent - Patient 0% Max wheelchair distance: 300 ft  Wheel 50 feet with 2 turns activity   Assist Level: Dependent - Patient 0%  Wheel 150 feet activity   Assist Level: Dependent - Patient 0%    Refer to Care Plan for Long Term Goals  SHORT TERM GOAL WEEK 1 PT Short Term Goal 1 (Week 1): Pt will perform all standing transfers with improved initial balance and overall supervision. PT Short Term Goal 2 (Week 1): pt will ambulate with no AD and minimal path deviation throughout community distances with overall supervision/ CGA. PT Short Term Goal 3 (Week 1): Pt will complete additional, appropriate outcome measure. PT Short Term Goal 4 (Week 1): Pt will complete floor transfer with overall CGA.  Recommendations for other services: None   Skilled Therapeutic Intervention Mobility Bed Mobility Bed Mobility: Supine to Sit;Sit to Supine Supine to Sit: Supervision/Verbal cueing (extra time) Sit to Supine: Supervision/Verbal cueing Transfers Transfers: Sit to Stand;Stand Pivot Transfers;Stand to Sit Sit to Stand: Contact Guard/Touching  assist Stand to Sit: Supervision/Verbal cueing Stand Pivot Transfers: Minimal Assistance - Patient > 75% Stand Pivot Transfer Details: Verbal cues for technique;Verbal cues for precautions/safety Transfer (Assistive device): None Locomotion  Gait Ambulation: Yes Gait Assistance: Contact Guard/Touching assist Gait Distance (Feet): 300 Feet Assistive device: None;Rolling walker Gait Gait: Yes Gait Pattern: Impaired Gait Pattern: Step-through pattern (intermittent missteps ith LLE) Stairs / Additional Locomotion Stairs: Yes Stairs Assistance: Contact Guard/Touching assist Stair Management Technique: Two rails Number of Stairs: 12 Height of Stairs: 6 Wheelchair Mobility Wheelchair Mobility: No   Discharge Criteria: Patient will be discharged from PT if patient refuses treatment 3 consecutive times without medical reason, if treatment goals not met, if there is a change in medical status, if patient makes no progress towards goals or if patient is discharged from hospital.  The above assessment, treatment plan, treatment alternatives and goals were discussed and mutually agreed upon: by patient and by family  Loel Dubonnet PT, DPT, CSRS 09/21/2023, 5:48 PM

## 2023-09-21 NOTE — Plan of Care (Signed)
  Problem: RH Balance Goal: LTG Patient will maintain dynamic standing with ADLs (OT) Description: LTG:  Patient will maintain dynamic standing balance with assist during activities of daily living (OT)  Flowsheets (Taken 09/21/2023 1304) LTG: Pt will maintain dynamic standing balance during ADLs with: Supervision/Verbal cueing   Problem: Sit to Stand Goal: LTG:  Patient will perform sit to stand in prep for activites of daily living with assistance level (OT) Description: LTG:  Patient will perform sit to stand in prep for activites of daily living with assistance level (OT) Flowsheets (Taken 09/21/2023 1304) LTG: PT will perform sit to stand in prep for activites of daily living with assistance level: Supervision/Verbal cueing   Problem: RH Eating Goal: LTG Patient will perform eating w/assist, cues/equip (OT) Description: LTG: Patient will perform eating with assist, with/without cues using equipment (OT) Flowsheets (Taken 09/21/2023 1304) LTG: Pt will perform eating with assistance level of: Set up assist    Problem: RH Grooming Goal: LTG Patient will perform grooming w/assist,cues/equip (OT) Description: LTG: Patient will perform grooming with assist, with/without cues using equipment (OT) Flowsheets (Taken 09/21/2023 1304) LTG: Pt will perform grooming with assistance level of: Set up assist    Problem: RH Bathing Goal: LTG Patient will bathe all body parts with assist levels (OT) Description: LTG: Patient will bathe all body parts with assist levels (OT) Flowsheets (Taken 09/21/2023 1304) LTG: Pt will perform bathing with assistance level/cueing: Supervision/Verbal cueing   Problem: RH Dressing Goal: LTG Patient will perform upper body dressing (OT) Description: LTG Patient will perform upper body dressing with assist, with/without cues (OT). Flowsheets (Taken 09/21/2023 1304) LTG: Pt will perform upper body dressing with assistance level of: Set up assist Goal: LTG Patient will  perform lower body dressing w/assist (OT) Description: LTG: Patient will perform lower body dressing with assist, with/without cues in positioning using equipment (OT) Flowsheets (Taken 09/21/2023 1304) LTG: Pt will perform lower body dressing with assistance level of: Supervision/Verbal cueing   Problem: RH Toileting Goal: LTG Patient will perform toileting task (3/3 steps) with assistance level (OT) Description: LTG: Patient will perform toileting task (3/3 steps) with assistance level (OT)  Flowsheets (Taken 09/21/2023 1304) LTG: Pt will perform toileting task (3/3 steps) with assistance level: Supervision/Verbal cueing   Problem: RH Toilet Transfers Goal: LTG Patient will perform toilet transfers w/assist (OT) Description: LTG: Patient will perform toilet transfers with assist, with/without cues using equipment (OT) Flowsheets (Taken 09/21/2023 1304) LTG: Pt will perform toilet transfers with assistance level of: Supervision/Verbal cueing   Problem: RH Tub/Shower Transfers Goal: LTG Patient will perform tub/shower transfers w/assist (OT) Description: LTG: Patient will perform tub/shower transfers with assist, with/without cues using equipment (OT) Flowsheets (Taken 09/21/2023 1304) LTG: Pt will perform tub/shower stall transfers with assistance level of: Supervision/Verbal cueing   Problem: RH Awareness Goal: LTG: Patient will demonstrate awareness during functional activites type of (OT) Description: LTG: Patient will demonstrate awareness during functional activites type of (OT) Flowsheets (Taken 09/21/2023 1304) Patient will demonstrate awareness during functional activites type of: Emergent LTG: Patient will demonstrate awareness during functional activites type of (OT): Supervision

## 2023-09-21 NOTE — Progress Notes (Signed)
 PROGRESS NOTE   Subjective/Complaints:  Pt reports he will "not take any meds for 3 days"- "no matter what". Discussed with pt and wife at length- it appears from pulling wife out of room that pt feels he will overdose on his meds if continues to take them- info it sounds like he heard from his mother- who also didn't take him in after had stroke 2-3 weeks ago because "he had good energy" per pt's wife.   Sounds like he's now perseverating on this topic, and will not change mind- he said He'd rather die right now than take his meds.     ROS: Limited by behavior and cognition  Objective:   No results found. Recent Labs    09/21/23 0540  WBC 9.9  HGB 15.7  HCT 44.7  PLT 205   Recent Labs    09/21/23 0540  NA 140  K 4.3  CL 101  CO2 25  GLUCOSE 102*  BUN 13  CREATININE 1.03  CALCIUM 9.6    Intake/Output Summary (Last 24 hours) at 09/21/2023 1004 Last data filed at 09/20/2023 2110 Gross per 24 hour  Intake 356 ml  Output --  Net 356 ml        Physical Exam: Vital Signs Blood pressure (!) 155/88, pulse 63, temperature (!) 97.5 F (36.4 C), temperature source Oral, resp. rate 18, height 5\' 8"  (1.727 m), weight 64.5 kg, SpO2 98%.    General: awake, alert, inappropriate, wife at bedside; pads c/w seizure precautions; NAD HENT: conjugate gaze; oropharynx moist CV: regular rate and rhythm; no JVD Pulmonary: CTA B/L; no W/R/R- good air movement GI: soft, NT, ND, (+)BS Psychiatric: inappropriate- intermittent yelling and cussing Neurological: some aphasia noted as well as delirium- not able to follow through with thought processes  - perseverating on NOT taking meds because will "overdose" on meds  Assessment/Plan: 1. Functional deficits which require 3+ hours per day of interdisciplinary therapy in a comprehensive inpatient rehab setting. Physiatrist is providing close team supervision and 24 hour management  of active medical problems listed below. Physiatrist and rehab team continue to assess barriers to discharge/monitor patient progress toward functional and medical goals  Care Tool:  Bathing              Bathing assist       Upper Body Dressing/Undressing Upper body dressing        Upper body assist      Lower Body Dressing/Undressing Lower body dressing            Lower body assist       Toileting Toileting    Toileting assist       Transfers Chair/bed transfer  Transfers assist           Locomotion Ambulation   Ambulation assist              Walk 10 feet activity   Assist           Walk 50 feet activity   Assist           Walk 150 feet activity   Assist           Walk  10 feet on uneven surface  activity   Assist           Wheelchair     Assist               Wheelchair 50 feet with 2 turns activity    Assist            Wheelchair 150 feet activity     Assist          Blood pressure (!) 155/88, pulse 63, temperature (!) 97.5 F (36.4 C), temperature source Oral, resp. rate 18, height 5\' 8"  (1.727 m), weight 64.5 kg, SpO2 98%.  Medical Problem List and Plan: 1. Functional deficits secondary to multifocal CVAs, complicated by oligodendroma with radiation necrosis             -patient may  shower             -ELOS/Goals: 10 to 12 days, supervision PT/OT/SLP              First day of evaluations- Con't CIR -PT, OT and SLP  -will have PA contact Palliative care to see if can get pt to take meds, to help- since he refuses IV or Cortrak- and said would pull them.  2.  Antithrombotics: -DVT/anticoagulation:  Pharmaceutical: Eliquis 5 mg twice daily             -antiplatelet therapy: n/a   3. Pain Management: As needed Tylenol   4. Mood/Behavior/Sleep: No current medications             -antipsychotic agents: n/a             -Sleep log added   3/31- refusing all meds 5.  Neuropsych/cognition: This patient is not capable of making decisions on his own behalf.             -Reporting ongoing confusion/delirium.  Delirium precautions placed.               3/31- pt refusing ALL meds, no matter what- getting more agitated- might need IM meds? 6. Skin/Wound Care: Standard precautions and orders.   7. Fluids/Electrolytes/Nutrition: CMET in a.m.  On regular diet.             -On nutritional supplements twice daily with meals             -B12 supplementation 8. Seizure disorder -Seizure precautions added -Last known seizure 3-26 -Lamictal 150 mg every morning, 200 mg every afternoon -As needed IV Ativan 1 to 2 mg for seizures added   3.31- at risk for more seizures and concern for needing to go back to acute- pt just got agitated when discussed- wife agreeable to treatment, but pt threatened to pull any tube.  9. Dysphagia -On regular diet and intake; pending SLP assessment   10. type 2 diabetes with hyperglycemia.  SSI 3 times daily AC.   3/31- A1c 5.4- down from 6.3  -will stop CBG's once he's odin gbetter-  11. Hypertension.  Continue amlodipine 10 mg daily, losartan 25 mg daily, propranolol 20 mg twice daily.   3/31- pt refusing all meds- at risk for new CVA and possible death- will call Palliative care.  12. Noncompliance-   3/31- sounds like pt perseverating on "overdose" from meds- an idea he got from his mother-will call Palliative care and see what might be able ot be done? Also pt's wife reached out to Dr Barbaraann Cao as well.   Will reach out to Neuro as well  to see if they have any ideas?   I spent a total of  55  minutes on total care today- >50% coordination of care- due to  D/w Dr Barbaraann Cao, Dr Armanda Magic- Neuro- and PA- also spoke with pt's wife at length and also wait to stop CBGs so don't miss a low BG  LOS: 1 days A FACE TO FACE EVALUATION WAS PERFORMED  Alan Wise 09/21/2023, 10:04 AM

## 2023-09-21 NOTE — Progress Notes (Signed)
 Slept good. Calm and cooperative this morning, but still declines meds. "I'm taking a 3 day break." Alan Wise A

## 2023-09-21 NOTE — Progress Notes (Signed)
 [facility] Individual Statement of Services  Patient Name:  Alan Wise  Date:  09/21/2023  Welcome to the [facility]patient [facility].  Our goal is to provide you with an individualized program based on your diagnosis and situation, designed to meet your specific needs.  With this comprehensive rehabilitation program, you will be expected to participate in at least 3 hours of rehabilitation therapies Monday-Friday, with modified therapy programming on the weekends.  Your rehabilitation program will include the following services:  Physical Therapy (PT), Occupational Therapy (OT), Speech Therapy (ST), 24 hour per day rehabilitation nursing, Therapeutic Recreaction (TR), Neuropsychology, Care Coordinator, Rehabilitation Medicine, Nutrition Services, and Pharmacy Services  Weekly team conferences will be held on Tuesday to discuss your progress.  Your Inpatient Rehabilitation Care Coordinator will talk with you frequently to get your input and to update you on team discussions.  Team conferences with you and your family in attendance may also be held.  Expected length of stay: 10-12 days  Overall anticipated outcome: overall supervision level  Depending on your progress and recovery, your program may change. Your Inpatient Rehabilitation Care Coordinator will coordinate services and will keep you informed of any changes. Your Inpatient Rehabilitation Care Coordinator's name and contact numbers are listed  below.  The following services may also be recommended but are not provided by the Inpatient Rehabilitation [facility]:   Home Health Rehabiltiation Services Outpatient Rehabilitation Services Vocational Rehabilitation   Arrangements will be made to provide these services after discharge if needed.  Arrangements include referral to agencies that provide these services.  Your insurance has been verified to be:  [employer] Your primary doctor is:  Nicki Reaper  Pertinent information will be shared with your doctor and your insurance company.  Inpatient Rehabilitation Care Coordinator:  Dossie Der, [place] [phone] or Luna Glasgow  Information discussed with and copy given to patient by: Lucy Chris, 09/21/2023, 11:36 AM

## 2023-09-21 NOTE — Progress Notes (Signed)
 Inpatient Rehabilitation  Patient information reviewed and entered into eRehab system by [facility]. Karen Kays., CCC/SLP, PPS Coordinator.  Information including medical coding, functional ability and quality indicators will be reviewed and updated through discharge.

## 2023-09-21 NOTE — Plan of Care (Signed)
  Problem: RH BOWEL ELIMINATION Goal: RH STG MANAGE BOWEL WITH ASSISTANCE Description: STG Manage Bowel with Assistance. Flowsheets (Taken 09/21/2023 0407) STG: Pt will manage bowels with assistance: 6-Modified independent Goal: RH STG MANAGE BOWEL W/MEDICATION W/ASSISTANCE Description: STG Manage Bowel with Medication with Assistance. Flowsheets (Taken 09/21/2023 0407) STG: Pt will manage bowels with medication with assistance: 5-Supervision/cueing   Problem: RH BLADDER ELIMINATION Goal: RH STG MANAGE BLADDER WITH ASSISTANCE Description: STG Manage Bladder With Assistance Flowsheets (Taken 09/21/2023 0407) STG: Pt will manage bladder with assistance: 6-Modified independent   Problem: RH SAFETY Goal: RH STG ADHERE TO SAFETY PRECAUTIONS W/ASSISTANCE/DEVICE Description: STG Adhere to Safety Precautions With Assistance/Device. Flowsheets (Taken 09/21/2023 0407) STG:Pt will adhere to safety precautions with assistance/device: 4-Minimal assistance Goal: RH STG DECREASED RISK OF FALL WITH ASSISTANCE Description: STG Decreased Risk of Fall With Assistance. Flowsheets (Taken 09/21/2023 0407) [id number] risk of fall  with assistance/device: 5-Supervision/cueing

## 2023-09-21 NOTE — Discharge Summary (Signed)
 Physician Discharge Summary  Patient ID: Alan Wise MRN: [id number] DOB/AGE: Jul 26, 1970 52 y.o.  Admit date: 09/20/2023 Discharge date: 09/25/2023  Discharge Diagnoses:  Principal Problem:   CVA (cerebral vascular accident) (HCC) Oligodendroma DVT prophylaxis Seizure disorder Dysphagia Type 2 diabetes mellitus Hypertension Hyperlipidemia  Discharged Condition: Stable  Significant Diagnostic Studies: DG Swallowing Func-Speech Pathology Result Date: 09/16/2023 Table formatting from the original result was not included. Modified Barium Swallow Study Patient Details Name: Alan Wise MRN: [id number] Date of Birth: March 06, 1971 Today's Date: 09/16/2023 HPI/PMH: HPI: 53 yr old presenting 3/24 with difficulty walking and swallowing. MRI brain revealed new punctate focus of restricted diffusion in the anterior insular cortex compatible with new acute/subacute infarct. Recent admission to [facility] ED 3/19-3/21 with AMS and new CVA. 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 Wife says aphasia initiated a year ago slowely decling but feels he is at his baseline with aphasia and is receiving outpatient ST per previous SLP note. BSE 09/10/23 initial cough with liquids not repeated with additional trials, complete mastication and clearance with regular textures. Recommmende regular/thin Clinical Impression: Clinical Impression: Pt demonstrates pharyngoesophageal and suspected esophageal dysphagia with DIGEST score of 2 (moderate impairment). Pt's pharyngoesophageal segment closes prematurely causing consistent backflow into his pyriform sinuses throughout the study and esophagus appeared to remain open after the swallow on several occasions. He had residue in his proximal esophagus with thicker consistencies in addition to backflow possibly explaining sudden coughing during meals. A Mendelsohn maneuver was attempted to  prolong PES opening however he was unable to adequately perform. Material in the esophagus ascended several times with mild amount reaching pharynx. At the end of the study with thin barium there was penetration to the vocal cords that cleared however when fluoro turned back on aspiration was observed and uncertain if from pyriform sinus residue or from possible reflux. Min-mild pyriform sinus residue consistently present not completely cleared with second swallows. Mastication of regular texture was timely without residual. Recommend upgrade to regular and educated wife to order textures he can masticate easier as he does have decreased sensation on left side oral cavity. Continue thin liquids, meds whole in applesauce, pt prefers cups over straws, alternate liquids and solids and place food on right side of oral cavity. ST will continue to follow and trial exercises to promote PES opening. Pt may benefit from further esophageal assessment with GI consult. Factors that may increase risk of adverse event in presence of aspiration Rubye Oaks & Clearance Coots 2021): No data recorded Recommendations/Plan: Swallowing Evaluation Recommendations Swallowing Evaluation Recommendations Recommendations: PO diet PO Diet Recommendation: Regular; Thin liquids (Level 0) Liquid Administration via: Cup (pt prefers cup over straw) Medication Administration: Whole meds with puree Supervision: Patient able to self-feed; Full supervision/cueing for swallowing strategies Swallowing strategies  : Slow rate; Small bites/sips; Follow solids with liquids; Check for pocketing or oral holding Postural changes: Position pt fully upright for meals; Stay upright 30-60 min after meals Oral care recommendations: Oral care BID (2x/day) Recommended consults: Consider GI consultation; Consider esophageal assessment Treatment Plan Treatment Plan Treatment recommendations: Therapy as outlined in treatment plan below Follow-up recommendations: -- (TBD) Functional  status assessment: Patient has had a recent decline in their functional status and demonstrates the ability to make significant improvements in function in a reasonable and predictable amount of time. Treatment frequency: Min 2x/week Treatment duration: 2 weeks Interventions: Diet toleration management by SLP; Patient/family education; Compensatory techniques; Other (comment) (trial of  Mendelsohn and Shaker) Recommendations Recommendations for follow up therapy are one component of a multi-disciplinary discharge planning process, led by the attending physician.  Recommendations may be updated based on patient status, additional functional criteria and insurance authorization. Assessment: Orofacial Exam: Orofacial Exam Oral Cavity: Oral Hygiene: WFL Oral Cavity - Dentition: Adequate natural dentition Orofacial Anatomy: WFL Oral Motor/Sensory Function: Suspected cranial nerve impairment CN VII - Facial: Left motor impairment Anatomy: Anatomy: Prominent cricopharyngeus Boluses Administered: Boluses Administered Boluses Administered: Thin liquids (Level 0); Mildly thick liquids (Level 2, nectar thick); Moderately thick liquids (Level 3, honey thick); Puree; Solid  Oral Impairment Domain: Oral Impairment Domain Lip Closure: No labial escape Tongue control during bolus hold: Cohesive bolus between tongue to palatal seal Bolus preparation/mastication: Timely and efficient chewing and mashing Bolus transport/lingual motion: Brisk tongue motion Initiation of pharyngeal swallow : Pyriform sinuses  Pharyngeal Impairment Domain: Pharyngeal Impairment Domain Soft palate elevation: No bolus between soft palate (SP)/pharyngeal wall (PW) Laryngeal elevation: Complete superior movement of thyroid cartilage with complete approximation of arytenoids to epiglottic petiole Anterior hyoid excursion: Complete anterior movement Epiglottic movement: Complete inversion Laryngeal vestibule closure: Incomplete, narrow column air/contrast in  laryngeal vestibule Pharyngeal stripping wave : Present - complete Pharyngeal contraction (A/P view only): N/A Pharyngoesophageal segment opening: Partial distention/partial duration, partial obstruction of flow Tongue base retraction: No contrast between tongue base and posterior pharyngeal wall (PPW) Pharyngeal residue: Collection of residue within or on pharyngeal structures Location of pharyngeal residue: Pyriform sinuses  Esophageal Impairment Domain: Esophageal Impairment Domain Esophageal clearance upright position: Esophageal retention with retrograde flow through the PES Pill: No data recorded Penetration/Aspiration Scale Score: Penetration/Aspiration Scale Score 1.  Material does not enter airway: Solid; Puree; Moderately thick liquids (Level 3, honey thick); Mildly thick liquids (Level 2, nectar thick) 5.  Material enters airway, CONTACTS cords and not ejected out: Thin liquids (Level 0) 8.  Material enters airway, passes BELOW cords without attempt by patient to eject out (silent aspiration) : Thin liquids (Level 0) Compensatory Strategies: Compensatory Strategies Compensatory strategies: Yes Effortful swallow: Ineffective Ineffective Effortful Swallow: Puree Left head turn: Ineffective Ineffective Left Head Turn: Thin liquid (Level 0) Mendelsohn : Effective Effective Mendelsohn: Thin liquid (Level 0)   General Information: Caregiver present: No  Diet Prior to this Study: Dysphagia 3 (mechanical soft); Thin liquids (Level 0)   Temperature : Normal   Respiratory Status: WFL   Supplemental O2: None (Room air)   History of Recent Intubation: No  Behavior/Cognition: Alert; Cooperative; Pleasant mood Self-Feeding Abilities: Able to self-feed Baseline vocal quality/speech: Normal Volitional Cough: Able to elicit No data recorded Exam Limitations: No limitations Goal Planning: Prognosis for improved oropharyngeal function: -- (fair-good) Barriers to Reach Goals: Other (Comment) (pt has aphasia) No data recorded  No data recorded Consulted and agree with results and recommendations: Patient; Family member/caregiver; Physician Pain: Pain Assessment Pain Assessment: Faces Faces Pain Scale: 0 End of Session: Start Time:SLP Start Time (ACUTE ONLY): 1208 Stop Time: SLP Stop Time (ACUTE ONLY): 1228 Time Calculation:SLP Time Calculation (min) (ACUTE ONLY): 20 min Charges: SLP Evaluations $ SLP Speech Visit: 1 Visit SLP Evaluations $BSS Swallow: 1 Procedure $MBS Swallow: 1 Procedure SLP visit diagnosis: SLP Visit Diagnosis: Dysphagia, pharyngoesophageal phase (R13.14) Past Medical History: Past Medical History: Diagnosis Date  Allergy   Complication of anesthesia   pt reports he is starting to have more difficulty arousing after surgery  Diabetes mellitus (HCC)   GERD (gastroesophageal reflux disease)   Headache   History of kidney stones  Hyperlipidemia   Renal disorder   kidney stones Past Surgical History: Past Surgical History: Procedure Laterality Date  APPLICATION OF CRANIAL NAVIGATION Right 01/17/2021  Procedure: APPLICATION OF CRANIAL NAVIGATION;  Surgeon: Bedelia Person, MD;  Location: [facility] OR;  Service: Neurosurgery;  Laterality: Right;  COLONOSCOPY  09/03/1994  COLONOSCOPY WITH PROPOFOL N/A 01/07/2023  Procedure: COLONOSCOPY WITH PROPOFOL;  Surgeon: [name], MD;  Location: [facility] ENDOSCOPY;  Service: Endoscopy;  Laterality: N/A;  NOT TOO EARLY  CYSTOSCOPY WITH STENT PLACEMENT Right 01/25/2016  Procedure: CYSTOSCOPY WITH STENT PLACEMENT;  Surgeon: Hildred Laser, MD;  Location: [facility] ORS;  Service: Urology;  Laterality: Right;  FRAMELESS  BIOPSY WITH BRAINLAB Right 01/17/2021  Procedure: RIGHT STEREOTACTIC BIOPSY OF INSULAR LESION;  Surgeon: Bedelia Person, MD;  Location: [facility] OR;  Service: Neurosurgery;  Laterality: Right;  KNEE ARTHROSCOPY W/ MENISCAL REPAIR Right 06/23/1988  POLYPECTOMY  01/07/2023  Procedure: POLYPECTOMY;  Surgeon: [name], MD;  Location: [facility] ENDOSCOPY;  Service: Endoscopy;;  removal of  birthmark  06/08/2013  SKIN CANCER EXCISION  06/23/2012  TYMPANOSTOMY TUBE PLACEMENT  08/06/1978  URETEROSCOPY WITH HOLMIUM LASER LITHOTRIPSY Right 01/25/2016  Procedure: URETEROSCOPY WITH HOLMIUM LASER LITHOTRIPSY;  Surgeon: Hildred Laser, MD;  Location: [facility] ORS;  Service: Urology;  Laterality: Right;  URETHRAL STRICTURE DILATATION    visual inspection of vocal cord    1973  WISDOM TOOTH EXTRACTION   [name] 09/16/2023, 3:36 PM  MR BRAIN W CONTRAST Result Date: 09/15/2023 CLINICAL DATA:  53 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 EXAM: MRI HEAD WITH CONTRAST TECHNIQUE: Multiplanar, multiecho pulse sequences of the brain and surrounding structures were obtained with intravenous contrast. CONTRAST:  7mL GADAVIST GADOBUTROL 1 MMOL/ML IV SOLN COMPARISON:  Noncontrast brain MRI yesterday and earlier FINDINGS: Heterogeneous enhancement of the chronic infiltrative right frontal lobe predominant tumor is stable compared to 09/07/2023. There remains regional ex vacuo enlargement of the right lateral ventricle. The punctate site of right insula enhancement on 09/07/2023 is now enhancing on series 6, image 26. And similar enhancement of the punctate left mesial temporal lobe diffusion restricted lesion seen at that time. Both of those areas have faded on DWI yesterday. However, ongoing and intense abnormal diffusion at the right cerebral peduncle (yesterday), not significantly changed over this series of exams, and with questionable petechial enhancement there now on this exam series 6, image 20. No other new areas of enhancement are identified. The major dural venous sinuses are enhancing and appear to be patent. IMPRESSION: Constellation of findings over this series of multiple MRIs without and with contrast since 09/07/2023 suspicious for: - infiltrative tumor progression into the right cerebral  peduncle. - superimposed scattered acute and subacute lacunar infarcts, with several from 09/07/2023 now faded on diffusion and with discrete post-ischemic appearing enhancement. Recommend continued MRI surveillance without and with contrast ( 4-6 weeks suggested time frame) to further evaluate the evolution of the above. Electronically Signed   By: Odessa Fleming M.D.   On: 09/15/2023 15:01   Overnight EEG with video Result Date: 09/15/2023 Charlsie Quest, MD     09/16/2023  9:51 AM Patient Name: Alan Wise MRN: [id number] Epilepsy Attending: Charlsie Quest Referring Physician/Provider: Charlsie Quest, MD Duration: 09/14/2023 1503 to 09/15/2023 1503 Patient history: 53yo M with hx of oligodendroglioma followed by Dr. Barbaraann Cao, seizures on lamotrigine who is currently undergoing treatment with Avastin who was admitted due to worsening mentation,  increased confusion, dizziness, weakness and trouble swallowing. EEG to evaluate for seizure Level of alertness: Awake, asleep AEDs during EEG study: LCM Technical aspects: This EEG study was done with scalp electrodes positioned according to the 10-20 International system of electrode placement. Electrical activity was reviewed with band pass filter of 1-70Hz , sensitivity of 7 uV/mm, display speed of 27mm/sec with a 60Hz  notched filter applied as appropriate. EEG data were recorded continuously and digitally stored.  Video monitoring was available and reviewed as appropriate. Description: The posterior dominant rhythm consists of 8-9 Hz activity of moderate voltage (25-35 uV) seen predominantly in posterior head regions, asymmetric ( right<right )  and reactive to eye opening and eye closing. Sleep was characterized by vertex waves, sleep spindles (12 to 14 Hz), maximal frontocentral region. EEG showed continuous 3 to 6 Hz theta-delta slowing in right hemisphere, maximal right temporal region. Hyperventilation and photic stimulation were not performed.   Event button  was pressed on 09/15/2023 at around 1015 and 1234 for right leg shaking and altered mental status.  Concomitant EEG before, during and after the event did not show any EEG changes suggest seizure.  ABNORMALITY - Continuous slow, right hemisphere, maximal right temporal region  IMPRESSION: This study is suggestive of cortical dysfunction arising from ight hemisphere, maximal right temporal region likely secondary to underlying structural abnormality. No seizures or epileptiform discharges were seen throughout the recording. Event button was pressed on 09/15/2023 at around 1015 and 1234 for right leg shaking and altered mental status without concomitant EEG change.  This could have been a nonepileptic event.  However focal motor seizures may not be seen on scalp EEG.  Therefore clinical correlation is recommended.  Charlsie Quest   MR BRAIN WO CONTRAST Result Date: 09/14/2023 CLINICAL DATA:  Neuro deficit, acute, stroke suspected. Progressive left-sided weakness. Progressive dysphasia. EXAM: MRI HEAD WITHOUT CONTRAST TECHNIQUE: Multiplanar, multiecho pulse sequences of the brain and surrounding structures were obtained without intravenous contrast. COMPARISON:  MR head 09/09/2023. FINDINGS: Brain: The diffusion-weighted images redemonstrate an 8 mm acute/subacute infarct involving the right cerebral peduncle. Previously noted restricted diffusion in the left hippocampus, potentially related to seizure activity has resolved. A new punctate focus of restricted diffusion is present in the anterior insular cortex. Restricted diffusion in the anterior right frontal lobe and along the atrium of the right lateral ventricle is similar the prior study. Diffusion changes within the genu of the corpus callosum have improved. No new hemorrhage is present. No new foci of restricted diffusion are present. Periventricular T2 hyperintensities in the left hemisphere are stable. Chronic encephalomalacia of the right frontal  operculum is stable. White matter changes extend along the right cerebral peduncle into the pons. The brainstem and cerebellum are otherwise within normal limits. Ex vacuo dilation of the right lateral ventricle is noted. No significant extraaxial fluid collection is present. The internal auditory canals are within normal limits. Vascular: Insert normal flow Skull and upper cervical spine: The craniocervical junction is normal. Upper cervical spine is within normal limits. Marrow signal is unremarkable. Sinuses/Orbits: The paranasal sinuses and mastoid air cells are clear. Bilateral lens replacements are noted. Globes and orbits are otherwise unremarkable. Other: IMPRESSION: 1. New punctate focus of restricted diffusion in the anterior insular cortex compatible with a new acute/subacute infarct. 2. 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 Tumor is considered less likely. 3. Stable restricted diffusion in the anterior right frontal lobe and along the atrium of the right lateral ventricle consistent with  treated tumor. 4. Improved diffusion changes within the genu of the corpus callosum suggesting these were ischemic. 5. Stable chronic encephalomalacia of the right frontal operculum. 6. Stable periventricular T2 hyperintensities in the left hemisphere. This likely reflects the sequela of chronic microvascular ischemia. Electronically Signed   By: Marin Roberts M.D.   On: 09/14/2023 15:32   ECHOCARDIOGRAM COMPLETE Result Date: 09/10/2023    ECHOCARDIOGRAM REPORT   Patient Name:   Alan Wise Date of Exam: 09/10/2023 Medical Rec #:  [id number]         Height:       68.0 in Accession #:    [id number]        Weight:       148.1 lb Date of Birth:  April 22, 1971        BSA:          1.799 m Patient Age:    52 years          BP:           138/81 mmHg Patient Gender: M                 HR:           55 bpm. Exam Location:  [facility] Procedure: 2D Echo, Cardiac Doppler, Color Doppler and  Saline Contrast Bubble            Study (Both Spectral and Color Flow Doppler were utilized during            procedure). Indications:     Stroke I63.9  History:         Patient has prior history of Echocardiogram examinations, most                  recent 01/15/2022. Risk Factors:Diabetes and Dyslipidemia.  Sonographer:     Cristela Blue Referring Phys:  [id number] AMY N COX Diagnosing Phys: Julien Nordmann MD  Sonographer Comments: No parasternal window and no subcostal window. IMPRESSIONS  1. Left ventricular ejection fraction, by estimation, is 55 to 60%. The left ventricle has normal function. The left ventricle has no regional wall motion abnormalities. Left ventricular diastolic parameters are consistent with Grade I diastolic dysfunction (impaired relaxation).  2. Right ventricular systolic function is normal. The right ventricular size is normal. There is normal pulmonary artery systolic pressure. The estimated right ventricular systolic pressure is 25.8 mmHg.  3. The mitral valve is normal in structure. No evidence of mitral valve regurgitation. No evidence of mitral stenosis.  4. The aortic valve is normal in structure. Aortic valve regurgitation is not visualized. No aortic stenosis is present.  5. The inferior vena cava is normal in size with greater than 50% respiratory variability, suggesting right atrial pressure of 3 mmHg.  6. Agitated saline contrast bubble study was negative, with no evidence of any interatrial shunt. FINDINGS  Left Ventricle: Left ventricular ejection fraction, by estimation, is 55 to 60%. The left ventricle has normal function. The left ventricle has no regional wall motion abnormalities. Strain was performed and the global longitudinal strain is indeterminate. The left ventricular internal cavity size was normal in size. There is no left ventricular hypertrophy. Left ventricular diastolic parameters are consistent with Grade I diastolic dysfunction (impaired relaxation). Right  Ventricle: The right ventricular size is normal. No increase in right ventricular wall thickness. Right ventricular systolic function is normal. There is normal pulmonary artery systolic pressure. The tricuspid regurgitant velocity is 2.28 m/s, and  with an assumed right atrial  pressure of 5 mmHg, the estimated right ventricular systolic pressure is 25.8 mmHg. Left Atrium: Left atrial size was normal in size. Right Atrium: Right atrial size was normal in size. Pericardium: There is no evidence of pericardial effusion. Mitral Valve: The mitral valve is normal in structure. No evidence of mitral valve regurgitation. No evidence of mitral valve stenosis. MV peak gradient, 2.2 mmHg. The mean mitral valve gradient is 1.0 mmHg. Tricuspid Valve: The tricuspid valve is normal in structure. Tricuspid valve regurgitation is not demonstrated. No evidence of tricuspid stenosis. Aortic Valve: The aortic valve is normal in structure. Aortic valve regurgitation is not visualized. No aortic stenosis is present. Aortic valve mean gradient measures 2.0 mmHg. Aortic valve peak gradient measures 4.0 mmHg. Aortic valve area, by VTI measures 3.01 cm. Pulmonic Valve: The pulmonic valve was normal in structure. Pulmonic valve regurgitation is not visualized. No evidence of pulmonic stenosis. Aorta: The aortic root is normal in size and structure. Venous: The inferior vena cava is normal in size with greater than 50% respiratory variability, suggesting right atrial pressure of 3 mmHg. IAS/Shunts: No atrial level shunt detected by color flow Doppler. Agitated saline contrast was given intravenously to evaluate for intracardiac shunting. Agitated saline contrast bubble study was negative, with no evidence of any interatrial shunt. There  is no evidence of a patent foramen ovale. There is no evidence of an atrial septal defect. Additional Comments: 3D was performed not requiring image post processing on an independent workstation and was  indeterminate.  LEFT VENTRICLE PLAX 2D LVIDd:         4.10 cm   Diastology LVIDs:         2.60 cm   LV e' medial:    5.33 cm/s LV PW:         1.20 cm   LV E/e' medial:  11.8 LV IVS:        1.10 cm   LV e' lateral:   6.85 cm/s LVOT diam:     2.00 cm   LV E/e' lateral: 9.2 LV SV:         52 LV SV Index:   29 LVOT Area:     3.14 cm  RIGHT VENTRICLE RV Basal diam:  2.10 cm RV Mid diam:    2.00 cm RV S prime:     11.60 cm/s TAPSE (M-mode): 1.6 cm LEFT ATRIUM           Index        RIGHT ATRIUM          Index LA diam:      3.30 cm 1.83 cm/m   RA Area:     7.43 cm LA Vol (A2C): 21.5 ml 11.95 ml/m  RA Volume:   11.30 ml 6.28 ml/m LA Vol (A4C): 13.1 ml 7.28 ml/m  AORTIC VALVE AV Area (Vmax):    2.78 cm AV Area (Vmean):   2.94 cm AV Area (VTI):     3.01 cm AV Vmax:           100.00 cm/s AV Vmean:          59.800 cm/s AV VTI:            0.172 m AV Peak Grad:      4.0 mmHg AV Mean Grad:      2.0 mmHg LVOT Vmax:         88.60 cm/s LVOT Vmean:        56.000 cm/s LVOT VTI:  0.165 m LVOT/AV VTI ratio: 0.96  AORTA Ao Root diam: 2.60 cm MITRAL VALVE               TRICUSPID VALVE MV Area (PHT): 3.58 cm    TR Peak grad:   20.8 mmHg MV Area VTI:   2.50 cm    TR Vmax:        228.00 cm/s MV Peak grad:  2.2 mmHg MV Mean grad:  1.0 mmHg    SHUNTS MV Vmax:       0.75 m/s    Systemic VTI:  0.16 m MV Vmean:      49.3 cm/s   Systemic Diam: 2.00 cm MV Decel Time: 212 msec MV E velocity: 62.90 cm/s MV A velocity: 75.50 cm/s MV E/A ratio:  0.83 Julien Nordmann MD Electronically signed by Julien Nordmann MD Signature Date/Time: 09/10/2023/11:37:44 AM    Final    MR BRAIN WO CONTRAST Result Date: 09/10/2023 CLINICAL DATA:  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 EXAM: MRI HEAD WITHOUT CONTRAST TECHNIQUE: Multiplanar, multiecho pulse sequences of the brain and surrounding structures were obtained without intravenous contrast. COMPARISON:  Comparison made with CT from earlier the same day as well as  multiple previous studies, including recent brain MRI from 09/07/2023 FINDINGS: Brain: Cerebral volume loss with mild chronic microvascular ischemic disease, stable. Few scattered superimposed remote lacunar infarcts noted about the pons. Previously noted areas of diffusion signal abnormality involving the anterior genu of the corpus callosum, right insula, and mesial left temporal lobe are slightly decreased in prominence from prior, likely reflecting small evolving subacute infarcts. No associated hemorrhage or mass effect. Additional 7 mm focus of diffusion signal abnormality at the right cerebral peduncle is relatively stable, and remains indeterminate, possibly reflecting ischemic change and/or tumor involvement. No other new or interval areas of infarction. Multifocal signal abnormality involving the right frontal and temporal lobes, consistent with known brain tumor. Few scattered foci of associated cystic encephalomalacia with associated T2/FLAIR signal. Associated diffusion signal abnormality is relatively stable. Scattered areas of intrinsic precontrast T1 hyperintensity and susceptibility artifact are also not appreciably changed, likely reflecting post treatment changes and/or chronic blood products. No evidence for new intracranial hemorrhage by MRI. Previously noted hyperdensity on prior CT corresponds with these signal changes. Although these changes on CT from earlier today are new as compared to most recent CT from 05/13/2023, these changes not appreciably changed from intervening brain MRIs, with no evidence for new or interval hemorrhage. No other mass lesion or mass effect. Stable ventricular size and morphology without hydrocephalus. No extra-axial fluid collection. Pituitary gland within normal limits. No other intrinsic temporal lobe abnormality. Vascular: Major intracranial vascular flow voids are maintained. Skull and upper cervical spine: Craniocervical junction within normal limits. Bone  marrow signal intensity normal. No scalp soft tissue abnormality. Sinuses/Orbits: Globes and orbital soft tissues within normal limits. Mild mucosal thickening present about the ethmoidal air cells and maxillary sinuses, slightly worse on the left. No significant mastoid effusion. Other: None. IMPRESSION: 1. No acute intracranial abnormality. 2. Previously described subcentimeter areas of diffusion signal abnormality (seen on recent brain MRI from 09/07/2023) involving the anterior genu of the corpus callosum, right insula, and mesial left temporal lobe are slightly decreased in prominence from prior, likely reflecting evolving subacute infarcts. No associated hemorrhage or mass effect. No areas of new or interval infarction. 3. Additional 7 mm focus of diffusion signal abnormality at the right cerebral peduncle is relatively stable, and remains indeterminate, possibly  reflecting ischemic change and/or tumor involvement. Attention at follow-up recommended. 4. Multifocal signal abnormality involving the right frontal and temporal lobes, consistent with known brain tumor. Overall appearance is not significantly changed, with no evidence for new or interval hemorrhage by MRI. 5. Underlying atrophy with chronic microvascular ischemic disease, with a few scattered remote lacunar infarcts about the pons. Electronically Signed   By: Rise Mu M.D.   On: 09/10/2023 00:51   CT HEAD WO CONTRAST ( ) Result Date: 09/09/2023 CLINICAL DATA:  History of oligodendroglioma, presenting with confusion and chest tightness. EXAM: CT HEAD WITHOUT CONTRAST TECHNIQUE: Contiguous axial images were obtained from the base of the skull through the vertex without intravenous contrast. RADIATION DOSE REDUCTION: This exam was performed according to the departmental dose-optimization program which includes automated exposure control, adjustment of the mA and/or kV according to patient size and/or use of iterative reconstruction  technique. COMPARISON:  May 13, 2023 FINDINGS: Brain: There is generalized cerebral atrophy with widening of the extra-axial spaces and ventricular dilatation. There are areas of decreased attenuation within the white matter tracts of the supratentorial brain, consistent with microvascular disease changes. Post treatment changes are again seen within the right frontal lobe. A mild-to-moderate amount of curvilinear increased attenuation (approximately 57.92 Hounsfield units) is also seen within this region, along the junction of the cortex and white matter. Mild involvement of the right parietal lobe is noted. There is no evidence of associated mass effect or midline shift. Vascular: Marked severity bilateral cavernous carotid artery calcification is noted. Skull: Normal. Negative for fracture or focal lesion. Sinuses/Orbits: There is marked severity left ethmoid sinus mucosal thickening. Other: None. IMPRESSION: 1. Post treatment changes within the right frontal lobe with a mild-to-moderate amount of curvilinear increased attenuation within this region, as described above. Given the patient's history of oligodendroglioma, this may represent areas of post treatment mineralization and calcification. A small amount of acute hemorrhage cannot completely be excluded. MRI correlation is recommended. 2. Generalized cerebral atrophy and microvascular disease changes of the supratentorial brain. 3. Marked severity left ethmoid sinus disease. Electronically Signed   By: Aram Candela M.D.   On: 09/09/2023 21:11   MR ANGIO NECK W WO CONTRAST Result Date: 09/09/2023 CLINICAL DATA:  Follow-up examination for stroke. EXAM: MRA NECK WITHOUT AND WITH CONTRAST MRA HEAD WITHOUT CONTRAST TECHNIQUE: Multiplanar and multiecho pulse sequences of the neck were obtained without and with intravenous contrast. Angiographic images of the neck were obtained using MRA technique without and with intravenous contrast; Angiographic  images of the Circle of Willis were obtained using MRA technique without intravenous contrast. CONTRAST:  6mL GADAVIST GADOBUTROL 1 MMOL/ML IV SOLN COMPARISON:  Comparison made with brain MRI from 09/07/2023. FINDINGS: MRA NECK FINDINGS Aortic arch: Visualized aortic arch within normal limits for caliber with standard 3 vessel morphology. No stenosis about the origin of the great vessels. Right carotid system: Right common and internal carotid arteries are patent with antegrade flow. No dissection or hemodynamically significant stenosis about the right carotid artery system. Left carotid system: Left common and internal carotid arteries are patent with antegrade flow. No dissection or hemodynamically significant stenosis about the left carotid artery system. Vertebral arteries: Both vertebral arteries arise from subclavian arteries. Vertebral arteries are patent with antegrade flow. No dissection or hemodynamically significant stenosis. Other: None. MRA HEAD FINDINGS Anterior circulation: Both internal carotid arteries are patent to the termini without stenosis or other abnormality. Right A1 segment patent. Left A1 hypoplastic and/or absent. Normal anterior communicating artery complex.  Anterior cerebral arteries patent without stenosis. No M1 stenosis or occlusion. Distal MCA branches perfused and symmetric. Posterior circulation: Visualized V4 segments patent without stenosis. Right PICA patent. Left PICA not seen. Basilar patent without stenosis. Superior cerebral arteries patent bilaterally. Fetal type origin of the PCAs bilaterally. Both PCAs patent to their distal aspects without significant stenosis. Anatomic variants: As above. Other: No intracranial aneurysm. IMPRESSION: Normal MRA of the head and neck. No large vessel occlusion, hemodynamically significant stenosis, or other acute vascular abnormality. Electronically Signed   By: Rise Mu M.D.   On: 09/09/2023 20:17   MR ANGIO HEAD WO  CONTRAST Result Date: 09/09/2023 CLINICAL DATA:  Follow-up examination for stroke. EXAM: MRA NECK WITHOUT AND WITH CONTRAST MRA HEAD WITHOUT CONTRAST TECHNIQUE: Multiplanar and multiecho pulse sequences of the neck were obtained without and with intravenous contrast. Angiographic images of the neck were obtained using MRA technique without and with intravenous contrast; Angiographic images of the Circle of Willis were obtained using MRA technique without intravenous contrast. CONTRAST:  6mL GADAVIST GADOBUTROL 1 MMOL/ML IV SOLN COMPARISON:  Comparison made with brain MRI from 09/07/2023. FINDINGS: MRA NECK FINDINGS Aortic arch: Visualized aortic arch within normal limits for caliber with standard 3 vessel morphology. No stenosis about the origin of the great vessels. Right carotid system: Right common and internal carotid arteries are patent with antegrade flow. No dissection or hemodynamically significant stenosis about the right carotid artery system. Left carotid system: Left common and internal carotid arteries are patent with antegrade flow. No dissection or hemodynamically significant stenosis about the left carotid artery system. Vertebral arteries: Both vertebral arteries arise from subclavian arteries. Vertebral arteries are patent with antegrade flow. No dissection or hemodynamically significant stenosis. Other: None. MRA HEAD FINDINGS Anterior circulation: Both internal carotid arteries are patent to the termini without stenosis or other abnormality. Right A1 segment patent. Left A1 hypoplastic and/or absent. Normal anterior communicating artery complex. Anterior cerebral arteries patent without stenosis. No M1 stenosis or occlusion. Distal MCA branches perfused and symmetric. Posterior circulation: Visualized V4 segments patent without stenosis. Right PICA patent. Left PICA not seen. Basilar patent without stenosis. Superior cerebral arteries patent bilaterally. Fetal type origin of the PCAs  bilaterally. Both PCAs patent to their distal aspects without significant stenosis. Anatomic variants: As above. Other: No intracranial aneurysm. IMPRESSION: Normal MRA of the head and neck. No large vessel occlusion, hemodynamically significant stenosis, or other acute vascular abnormality. Electronically Signed   By: Rise Mu M.D.   On: 09/09/2023 20:17   DG Chest Port 1 View Result Date: 09/09/2023 CLINICAL DATA:  [id number] Chest pain [id number] EXAM: PORTABLE CHEST 1 VIEW COMPARISON:  04/11/2023 FINDINGS: Bilateral lung fields are clear. Bilateral costophrenic angles are clear. Normal cardio-mediastinal silhouette. No acute osseous abnormalities. The soft tissues are within normal limits. IMPRESSION: No active disease. Electronically Signed   By: Jules Schick M.D.   On: 09/09/2023 15:09   MR BRAIN W WO CONTRAST Result Date: 09/07/2023 CLINICAL DATA:  Provided history: Oligodendroglioma. Brain/CNS neoplasm, assess treatment response. Additional history provided by the scanning technologist: Multiple falls, vision loss in left eye, aphasia. Avastin therapy. Restaging. EXAM: MRI HEAD WITHOUT AND WITH CONTRAST TECHNIQUE: Multiplanar, multiecho pulse sequences of the brain and surrounding structures were obtained without and with intravenous contrast. CONTRAST:  6mL GADAVIST GADOBUTROL 1 MMOL/ML IV SOLN COMPARISON:  Brain MRI 07/06/2023. FINDINGS: Brain: Mild generalized cerebral atrophy. 9 mm focus of restricted diffusion with associated T2 FLAIR hyperintense signal abnormality in the right cerebral  peduncle (series 5, image 73). 5 mm focus of restricted diffusion with associated T2 FLAIR hyperintense signal abnormality in the anterior left temporal lobe (series 5, image 74). 3 mm focus of restricted diffusion within the callosal genu at midline (series 5, image 81). 5 mm focus of restricted diffusion within the right insula with associated T2 FLAIR hyperintense signal abnormality. These foci are new as  compared to the brain MRI of 07/06/2023 and may reflect acute/early subacute infarcts. However, new sites of tumor cannot be excluded. There is a new 3 mm focus of enhancement in the right subinsular region (series 16, image 90). No other new or progressive foci of enhancement are identified. An irregular focus of restricted diffusion within the right retrolenticular/periatrial region has mildly increased in extent since the prior MRI. A region of restricted diffusion within the mid and anterior right frontal lobe (along the margins of the right lateral ventricle) has also slightly increased in extent (for instance, extending more medially within the body of the corpus callosum on series 10, image 25). T2 FLAIR hyperintense signal abnormality within the right cerebral hemisphere has not significantly changed, nor has T2 FLAIR hyperintense signal abnormality within the body/genu of corpus callosum and left frontal lobe periventricular white matter. Unchanged parenchymal swelling at the right insula. Superimposed foci of chronic blood products within the right frontal lobe have increased in extent. Redemonstrated focus of cystic encephalomalacia at the anterior right insula. Chronic lacunar infarcts again demonstrated within the left aspect of the pons. Mild multifocal T2 FLAIR hyperintense signal abnormality elsewhere within the cerebral white matter and pons, nonspecific but compatible chronic small vessel ischemic disease. No extra-axial fluid collection. No midline shift. Vascular: Maintained flow voids within the proximal large arterial vessels. Skull and upper cervical spine: No focal worrisome marrow lesion. Right frontoparietal burr hole. Sinuses/Orbits: No focal worrisome marrow lesion. Incompletely assessed cervical spondylosis. Other: No mass or acute finding within the imaged orbits. Mild mucosal thickening within the left ethmoid and left maxillary sinuses. MRi brain impression #1 called by telephone at  the time of interpretation on 09/07/2023 at 7:28 pm to provider [facility] VASLOW , who verbally acknowledged these results. IMPRESSION: 1. Foci of restricted diffusion and T2 FLAIR hyperintense signal abnormality within the right cerebral peduncle, anterior left temporal lobe, callosal genu and right insula, as described and new from the prior brain MRI of 07/06/2023. These foci may reflect acute/early subacute infarcts. However, new sites of tumor cannot be excluded. A short-interval follow-up brain MRI is recommended. 2. Foci of restricted diffusion within the right retrolenticular/periatrial region and right frontal lobe have slightly increased in extent since the prior MRI. Additionally, there is a new 3 mm right subinsular focus of parenchymal enhancement. These findings may reflect evolving treatment changes. However, a short-interval follow-up brain MRI is recommended for close surveillance. 3. Pre-contrast T1 hyperintense chronic blood products within the right frontal lobe have increased in extent. 4. Otherwise no significant interval change. 5. 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 Electronically Signed   By: Jackey Loge D.O.   On: 09/07/2023 20:07    Labs:  Basic Metabolic Panel: Recent Labs  Lab 09/16/23 0531 09/21/23 0540  NA 140 140  K 3.9 4.3  CL 102 101  CO2 27 25  GLUCOSE 116* 102*  BUN 12 13  CREATININE 0.99 1.03  CALCIUM 9.3 9.6  MG 2.0  --   PHOS 3.6  --     CBC: Recent  Labs  Lab 09/16/23 0531 09/21/23 0540  WBC 8.0 9.9  NEUTROABS  --  6.3  HGB 15.6 15.7  HCT 43.6 44.7  MCV 83.5 83.2  PLT 195 205    CBG: Recent Labs  Lab 09/21/23 1108 09/21/23 1608 09/21/23 2033 09/22/23 0602 09/22/23 1146  GLUCAP 112* 136* 138* 121* 128*   Family history.  Father with hypertension hyperlipidemia and diabetes.  Maternal grandmother with CVA.  Denies any colon cancer esophageal cancer or rectal  cancer.  Brief HPI:   Alan Wise is a 53 y.o. 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  They presented to the hospital 09/14/2023 with worsening gait left-sided weakness/aphasia and new onset dysphagia.  Of note, he was recently hospitalized from 3-19 to 3-21 with new CVA and breakthrough seizures.  In the ED he was started on IV fluids and MRI of the brain showed new punctate focus of restricted diffusion in the anterior insular cortex compatible with new acute/subacute infarct, stable 8 mm acute/subacute infarct involving the right cerebral peduncle, stable restricted diffusion in the anterior frontal lobe and along the atrium of the right lateral ventricle consistent with treated tumor, improved diffusion changes within the genu of the corpus callosum suggesting these were ischemic, stable chronic encephalomalacia of the right frontal operculum, and stable periventricular T2 hyperintensities in the left hemisphere likely reflects the sequela of chronic microvascular ischemia. He was started on Eliquis, AEDs were adjusted and admitted with evaluation by neurology and Dr. 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  Hospitalization complicated by dysphagia and GI determined to be neurologic.  Last known seizure 3-26, with subsequent increase nightly Lamictal.  Therapy evaluations completed due to patient decreased functional mobility was admitted for a comprehensive rehab program.   Hospital Course: Alan Wise was admitted to rehab 09/20/2023 for inpatient therapies to consist of PT, ST and OT at least three hours five days a week. Past admission physiatrist, therapy team and rehab RN have worked together to provide  customized collaborative inpatient rehab.  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  Followed by neurology as well as radiation oncology.  Maintained on chronic Eliquis with no bleeding tendencies as well as low-dose aspirin.  Blood pressure controlled and monitored on Norvasc as well as Cozaar.  He would need outpatient follow-up.   Seizure disorder Lamictal adjusted and monitored.  Diet was advanced to soft/regular.  Blood sugars overall controlled with type 2 diabetes mellitus.  Monitoring of blood pressure with increased mobility maintained on amlodipine losartan and propranolol.  He did have bouts of confusion/delirium agitation and would routinely refuse all medications even with strong prompting discussed with his wife and patient on risk factors if he continued to refuse medications.  Palliative care had been consulted to establish goals of care.   Blood pressures were monitored on TID basis and controlled and monitored  Diabetes has been monitored with ac/hs CBG checks and SSI was use prn for tighter BS control.    Rehab course: During patient's stay in rehab weekly team conferences were held to monitor patient's progress, set goals and discuss barriers to discharge. At admission, patient required minimal assist mobility with rolling walker contact-guard sit to stand  Physical exam.  Blood pressure 110/70 pulse 80 temperature 98 respirations  18 oxygen saturations 94% room air Constitutional.  No acute distress HEENT Head.  Normocephalic and atraumatic Eyes.  Pupils round and reactive to light no discharge without nystagmus Neck.  Supple nontender no JVD without thyromegaly Cardiac regular rate and rhythm without the extra sounds or murmur heard Abdomen.  Soft nontender positive bowel sounds without rebound Respiratory effort normal no respiratory distress without wheeze Skin.  Warm and dry Neurologic.  Alert oriented to person partially  to time and place with cues.  Follow 2 out of 5 simple commands.  Global aphasia with greater expressive component    He/She  has had improvement in activity tolerance, balance, postural control as well as ability to compensate for deficits. He/She has had improvement in functional use RUE/LUE  and RLE/LLE as well as improvement in awareness.  Requires contact-guard assist for mobility he did display decreased attention to the left as well as awareness.  Placed 300 feet without assistive device.  Required min with basic self-care skills.  Speech therapy follow-up for severe expressive and moderate receptive aphasia evident across structured functional evaluation task.  He demonstrated adequate auditory comprehension during structured tasks, though reduced comprehension was evident and task of increasing complexity such as conversation regarding medical history.  It was discussed with family need for 24-hour supervision for safety.  Full family teaching completed plan discharge to home.       Disposition:  There are no questions and answers to display.         Diet: Carb modified  Special Instructions: No driving smoking or alcohol  Medications at discharge 1.  Tylenol as needed 2.  Norvasc 10 mg p.o. daily 3.  Eliquis 5 mg p.o. twice daily 4.  Lipitor 40 mg p.o. daily 5.  Vitamin D 1000 units p.o. daily 6.  Vitamin B12 1000 mcg p.o. daily 7.  Lamictal 150 mg every morning and 200 mg nightly 8.  Cozaar 25 mg p.o. daily 9.  Inderal 20 mg p.o. twice daily 10.  Depakote 500 mg every 8 hours 11.  Aspirin 81 mg daily  30-35 minutes were spent completing discharge summary and discharge planning  Discharge Instructions     Ambulatory referral to Neurology   Complete by: As directed    An appointment is requested in approximately: 4 weeks multifocal CVA        Follow-up Information     Lovorn, Aundra Millet, MD Follow up.   Specialty: Physical Medicine and Rehabilitation Why: Office  to call for appointment Contact information: [address] [place] [address] [phone]         Henreitta Leber, MD Follow up.   Specialties: Psychiatry, Neurology, Oncology Why: Call for appointment Contact information: [address] [place] [address] [phone]         Doristine Locks V, DO Follow up.   Specialty: Gastroenterology Why: As needed Contact information: [address] [place] [address] [phone]                 Signed: Charlton Amor 09/22/2023, 12:08 PM

## 2023-09-21 NOTE — Progress Notes (Addendum)
 IV medications scheduled, but pt refused IV placement, education provided, still declined

## 2023-09-21 NOTE — Consult Note (Signed)
 Consultation Note Date: 09/21/2023   Patient Name: Alan Wise  DOB: 13-Jul-1970  MRN: [id number]  Age / Sex: 53 y.o., male  PCP: Lorre Munroe, NP Referring Physician: Genice Rouge, MD  Reason for Consultation: Establishing goals of care  HPI/Patient Profile: 53 y.o. 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   Workup in the ED including MRI brain showed new punctate focus of restricted diffusion in the anterior insular cortex compatible with a new acute/subacute infarct, stable 8 mm acute/subacute infarct involving the right cerebral peduncle.   Patient was hospitalized for management of seizures and CVA. He was also hospitalized before this from 3-19 to 3-21 with new CVA and breakthrough seizures.  PMT has been consulted to assist with goals of care conversation in the setting of medication noncompliance.  Clinical Assessment and Goals of Care:  I have reviewed medical records including EPIC notes, labs and imaging, assessed the patient and then met at the bedside with patient's wife to discuss diagnosis prognosis, GOC, EOL wishes, disposition and options.  I introduced Palliative Medicine as specialized medical care for people living with serious illness. It focuses on providing relief from the symptoms and stress of a serious illness. The goal is to improve quality of life for both the patient and the family.  We discussed a brief life review of the patient and then focused on their current illness.   I attempted to elicit values and goals of care important to the patient.    Medical History Review and Understanding:  Patient states that he has a good understanding of his current care plan, but he is unable to go  into detail or give specifics. He states that his issues began around 4 years ago. He is focused on the number 4, stating there are 4 levels to his concerns and also that it is 4 o'clock.  Social History: Patient lives with his wife and has 26 and 47 year old children. He is also supported by his parents.  Functional and Nutritional State: Patient states that he is not able to do what he used to be able to do, which has been hard for him to adapt to. He is most disturbed by his inability to drive and is able to express he understands this may never be possible again. He ambulated to the bathroom while I was speaking with him. Per NT, he sometimes will try to get up without using it.   Palliative Symptoms: Agitation, worse over the past 24 hours  Advance Directives: A detailed discussion regarding advanced directives was had. Patient confirms that he does not want to complete HCPOA/Living Will and that he knows his wife will make decision on his behalf if he is unable. At the same time, he questions that she cares for him and    Discussion: I initially spoke with patient independently before we were then joined by his wife. Patient is appreciative of the time taken to attempt to understand his efforts at communication. When asked about his quality of life, he oscillates between shaking his head/saying "no" and attempting to describe changes that occurred over the course of the last 4 years. He uses hand gestures to show that for "1-3" he was "going up" more than he initially expected and then "in year 3"  went back down again. He stated that when all of this started, he initially thought he was looking at end of life sooner than this.   He became aggressive towards his wife when she joined the conversation and answered my question about her concerns, cursing at her and then acknowledging that I specifically asked for her input. He calmed down with reassurance. Wife Alan Wise is concerned that he is not  taking his medications and at increased risk for seizures, strokes, death. Patient is dismissive of her concerns. He states that she does not care for him, yet voices that he would want her to make decisions on his behalf rather than complete advance directives.  This PA took the opportunity to explore his thoughts and feelings on EOL. Also attempted to delineate whether he understood the possibility of consequences, including death, within the 3 day holiday that he would like to take from his medications. He states that he has already discussed his EOL wishes with "that one," pointing at his wife and needing prompting for her name. He gave me permission to ask her what she recalls from those conversations. [name] shares that patient would be open to life support but not indefinitely, preferring to try things like ventilator support and other aggressive measures for 1-2 months then revisiting the decision based on medical team's input. He again grew frustrated with her, cutting her off to say that she doesn't even care to understand how his feelings have changed. When I asked him for clarification, he states he would be ok with being dependent on life support for "much longer. " I asked if this includes a situation in which he was unable to interact with others or understand what is happening to him and he responded "yes." I encouraged continued conversations about his goals of care, as his feelings may change from day to day and his wife needs to hear this from him.   Encouraged him to consider compromising on the care plan given his long-term goals to prolong life by all means necessary. I spoke with patient's wife separately afterwards and we discussed concern that his mother may be a factor, as well as the recent change in environment. She is very appreciative of palliative support.    Discussed the importance of continued conversation with family and the medical providers regarding overall plan of care  and treatment options, ensuring decisions are within the context of the patient's values and GOCs.   Questions and concerns were addressed.  Hard Choices booklet left for review. The family was encouraged to call with questions or concerns.  PMT will continue to support holistically.   SUMMARY OF RECOMMENDATIONS   -Continue full code/full scope treatment -Goals of care are for continued life-prolonging care. He would be agreeable to becoming dependent on life support for several months even if he could not interact with his environment -Patient does not have capacity or insight into how his current decisions are incongruent with his overall goals and priorities -Wife is surrogate decision maker as legal next of kin. Patient refused HCPOA/living will documentation. She is going to decide between allowing him to refuse meds or consenting to sedating him and giving life-prolonging medications. Awaits Dr. Liana Gerold input -Psychosocial and emotional support provided -Spiritual care consult appreciated  -Ongoing GOC discussions -PMT will continue to follow and support   Prognosis:  Unable to determine  Discharge Planning: To Be Determined      Primary Diagnoses: Present on Admission:  CVA (cerebral vascular accident) (HCC)  Physical Exam Vitals and nursing note reviewed.  Constitutional:      General: He is not in acute distress.    Appearance: He is ill-appearing.  Cardiovascular:     Rate and Rhythm: Normal rate.  Pulmonary:     Effort: Pulmonary effort is normal.  Neurological:     Mental Status: He is alert.  Psychiatric:        Judgment: Judgment is impulsive.     Comments: Lacks insight     Vital Signs: BP (!) 155/88 (BP Location: Left Arm)   Pulse 63   Temp (!) 97.5 F (36.4 C) (Oral)   Resp 18   Ht 5\' 8"  (1.727 m)   Wt 64.5 kg   SpO2 98%   BMI 21.62 kg/m  Pain Scale: 0-10   Pain Score: 0-No pain   SpO2: SpO2: 98 % O2 Device:SpO2: 98 % O2 Flow Rate: .     Total time: I spent 120 minutes in the care of the patient today in the above activities and documenting the encounter.  MDM: high   Day Greb Jeni Salles, PA-C  Palliative Medicine Team Team phone # [phone]  Thank you for allowing the Palliative Medicine Team to assist in the care of this patient. Please utilize secure chat with additional questions, if there is no response within 30 minutes please call the above phone number.  Palliative Medicine Team providers are available by phone from 7am to 7pm daily and can be reached through the team cell phone.  Should this patient require assistance outside of these hours, please call the patient's attending physician.

## 2023-09-21 NOTE — Telephone Encounter (Signed)
 Patients wife called to report that patient was transferred to Inpatient Rehab yesterday and since arriving there he has refused to take any and all of his medications.  She is very concerned since he hasn't had any seizure control and no stroke prevention within the past 24 hours.  Unsure how to proceed with the patients refusal.    Notified Dr Barbaraann Cao verbally.  He will reach out to wife.

## 2023-09-21 NOTE — Plan of Care (Signed)
  Problem: Nutritional: Goal: Maintenance of adequate nutrition will improve 09/21/2023 1045 by Daryel November, RN Outcome: Progressing 09/21/2023 1044 by Daryel November, RN Outcome: Not Progressing Goal: Progress toward achieving an optimal weight will improve 09/21/2023 1045 by Daryel November, RN Outcome: Progressing 09/21/2023 1044 by Daryel November, RN Outcome: Not Progressing   Problem: Skin Integrity: Goal: Risk for impaired skin integrity will decrease 09/21/2023 1045 by Daryel November, RN Outcome: Progressing 09/21/2023 1044 by Daryel November, RN Outcome: Not Progressing   Problem: RH BOWEL ELIMINATION Goal: RH STG MANAGE BOWEL WITH ASSISTANCE Description: STG Manage Bowel with Assistance. 09/21/2023 1045 by Daryel November, RN Outcome: Progressing 09/21/2023 1044 by Daryel November, RN Outcome: Not Progressing Goal: RH STG MANAGE BOWEL W/MEDICATION W/ASSISTANCE Description: STG Manage Bowel with Medication with Assistance. 09/21/2023 1045 by Daryel November, RN Outcome: Progressing 09/21/2023 1044 by Daryel November, RN Outcome: Not Progressing   Problem: RH BLADDER ELIMINATION Goal: RH STG MANAGE BLADDER WITH ASSISTANCE Description: STG Manage Bladder With Assistance 09/21/2023 1045 by Daryel November, RN Outcome: Progressing 09/21/2023 1044 by Daryel November, RN Outcome: Not Progressing   Problem: RH SKIN INTEGRITY Goal: RH STG SKIN FREE OF INFECTION/BREAKDOWN 09/21/2023 1045 by Daryel November, RN Outcome: Progressing 09/21/2023 1044 by Daryel November, RN Outcome: Not Progressing Goal: RH STG MAINTAIN SKIN INTEGRITY WITH ASSISTANCE Description: STG Maintain Skin Integrity With Assistance. 09/21/2023 1045 by Daryel November, RN Outcome: Progressing 09/21/2023 1044 by Daryel November, RN Outcome: Not Progressing

## 2023-09-22 DIAGNOSIS — I639 Cerebral infarction, unspecified: Secondary | ICD-10-CM | POA: Diagnosis not present

## 2023-09-22 DIAGNOSIS — D496 Neoplasm of unspecified behavior of brain: Secondary | ICD-10-CM

## 2023-09-22 DIAGNOSIS — R4701 Aphasia: Secondary | ICD-10-CM

## 2023-09-22 LAB — GLUCOSE, CAPILLARY
Glucose-Capillary: 121 mg/dL — ABNORMAL HIGH (ref 70–99)
Glucose-Capillary: 128 mg/dL — ABNORMAL HIGH (ref 70–99)
Glucose-Capillary: 168 mg/dL — ABNORMAL HIGH (ref 70–99)
Glucose-Capillary: 140 mg/dL — ABNORMAL HIGH (ref 70–99)

## 2023-09-22 MED ORDER — DIVALPROEX SODIUM 250 MG PO DR TAB
250.0000 mg | DELAYED_RELEASE_TABLET | Freq: Three times a day (TID) | ORAL | Status: DC
  Filled 2023-09-22 (×2): qty 1

## 2023-09-22 MED ORDER — DIVALPROEX SODIUM 500 MG PO DR TAB
500.0000 mg | DELAYED_RELEASE_TABLET | Freq: Three times a day (TID) | ORAL | Status: DC
  Filled 2023-09-22 (×8): qty 1

## 2023-09-22 NOTE — Progress Notes (Signed)
   09/22/23 1639  Spiritual Encounters  Type of Visit Initial  Care provided to: Pt and family (Wife- [name])  Conversation partners present during Programmer, systems  Referral source Chaplain team  Reason for visit Routine spiritual support   Chaplain's Reason for Visit: Chaplain visited Pt in his room as a result of Spiritual Care consult for a chaplain visit.  Chaplain time spent: 30 minutes  Chaplain Interventions: Established relationship of care and support Explored Pt spiritual, emotional, and relational needs and resources Facilitated storytelling Listened patiently and empathetically as Pt worked to Metallurgist Assessment: Pt Needs Spiritual, Emotional, & Relational: Understanding what the Pt's Spiritual, Emotional, and Relational needs are at this point is tricky.  Pt is limited in his cognition and ability to access feelings and emotions on that level. Pt is also limited in his ability to communicate/express. Thirdly, Pt is very concentrated at this point dealing with the physical and possibly does not possess the bandwidth at present to devote to these feelings.  Intermediate hopes: To get strong enough physically to return home.   Ultimate hopes: Patient states he wants to continue to fight his disease for his wife and his children.  (This statement was actually made by the wife and the Pt agreed with it.  He was having trouble answering a question about "is he tired of fighting" and she gave him 2 prompts, "Do you want to quit and die" or "Keep fighting for your wife and your children."  I would be curious how he would have answered without her leading.)  Pt Resources  Resources Identified:  Pt's wife is obviously a huge resource for him; without her, he would not have much Was unable to assess Pt's spirituality to know if there was any resource there for him.  He wasn't able at the time of our conversation to grasp the question "what kinds of things help you when  you feel sad?" Again, he's firmly entrenched right now in the physical world of medications, and medical interventions. 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  In exploring how that felt, Pt spoke with flat effect stating, "that is over for me now."  Pt shared that this is the second fetal loss she has experienced- the previous one being in August.  Pt also stated she experience a couple early first trimmest miscarriages before that.  Pt appeared to understand that this "downhill slide" as he describes it both verbally and with hand motions, is "the end" as he terms it.  He appears to understand that all that he is losing (mentally and physically) will not return and he will die soon.  While most of his conversation with me was with flat affect, he did appear teary on one point while discussing the loss of his career.  He also show moments of enjoyment when his wife spoke of the kids and when she affectionately teased him  Pt and wife spoke of possible Friday discharge to home.  Chaplaincy Plan: Lunette Stands will f/u with palliative chaplain and determine the plan of care.  Chaplain Raelene Bott, MDiv [email or web address]

## 2023-09-22 NOTE — Progress Notes (Signed)
 Patient agreed to take Lamictal 150 mg this afternoon as well as 200 mg this evening at scheduled time. Patient agreed to discuss adding another medication tomorrow, patient agreed to discuss with nurse tomorrow.

## 2023-09-22 NOTE — Progress Notes (Signed)
 PROGRESS NOTE   Subjective/Complaints:  Pt thinks he's on day 2 of not taking meds, however didn't take meds Sunday night, so we are actually almost at day 3- per his "rule"- should be able to take tonight, but pt still emphatic will not take meds.   LBM in computer 3/27- but won't take meds to get him to have BM.  Does reports therapy went well-     ROS: Limited by behavior and cognition Objective:   No results found. Recent Labs    09/21/23 0540  WBC 9.9  HGB 15.7  HCT 44.7  PLT 205   Recent Labs    09/21/23 0540  NA 140  K 4.3  CL 101  CO2 25  GLUCOSE 102*  BUN 13  CREATININE 1.03  CALCIUM 9.6   No intake or output data in the 24 hours ending 09/22/23 1027       Physical Exam: Vital Signs Blood pressure 134/73, pulse 70, temperature 97.9 F (36.6 C), temperature source Oral, resp. rate 17, height 5\' 8"  (1.727 m), weight 64.5 kg, SpO2 99%.     General: awake, alert, inappropriate, no cussing today- wife not at bedside; NAD HENT: conjugate gaze; oropharynx moist CV: regular rate and rhythm; no JVD Pulmonary: CTA B/L; no W/R/R- good air movement GI: soft, NT, ND, (+)BS Psychiatric: inappropriate Neurological: aphasic- still started to get worked up about not taking meds as soon as I entered room;   - perseverating on NOT taking meds  Assessment/Plan: 1. Functional deficits which require 3+ hours per day of interdisciplinary therapy in a comprehensive inpatient rehab setting. Physiatrist is providing close team supervision and 24 hour management of active medical problems listed below. Physiatrist and rehab team continue to assess barriers to discharge/monitor patient progress toward functional and medical goals  Care Tool:  Bathing    Body parts bathed by patient: Right arm, Left arm, Chest, Front perineal area, Abdomen, Right upper leg, Left upper leg, Right lower leg, Left lower leg, Face    Body parts bathed by helper: Buttocks     Bathing assist Assist Level: Minimal Assistance - Patient > 75%     Upper Body Dressing/Undressing Upper body dressing   What is the patient wearing?: Pull over shirt    Upper body assist Assist Level: Minimal Assistance - Patient > 75%    Lower Body Dressing/Undressing Lower body dressing      What is the patient wearing?: Pants     Lower body assist Assist for lower body dressing: Contact Guard/Touching assist     Toileting Toileting    Toileting assist Assist for toileting: Minimal Assistance - Patient > 75%     Transfers Chair/bed transfer  Transfers assist     Chair/bed transfer assist level: Minimal Assistance - Patient > 75%     Locomotion Ambulation   Ambulation assist      Assist level: Minimal Assistance - Patient > 75% Assistive device: No Device Max distance: 300 ft   Walk 10 feet activity   Assist     Assist level: Contact Guard/Touching assist Assistive device: No Device   Walk 50 feet activity   Assist  Assist level: Minimal Assistance - Patient > 75% Assistive device: No Device    Walk 150 feet activity   Assist    Assist level: Minimal Assistance - Patient > 75% Assistive device: No Device, Walker-rolling    Walk 10 feet on uneven surface  activity   Assist Walk 10 feet on uneven surfaces activity did not occur: Safety/medical concerns         Wheelchair     Assist Is the patient using a wheelchair?: Yes (only using w/c for transport - not expected to require on d/c) Type of Wheelchair: Manual    Wheelchair assist level: Dependent - Patient 0% Max wheelchair distance: 300 ft    Wheelchair 50 feet with 2 turns activity    Assist        Assist Level: Dependent - Patient 0%   Wheelchair 150 feet activity     Assist      Assist Level: Dependent - Patient 0%   Blood pressure 134/73, pulse 70, temperature 97.9 F (36.6 C), temperature source  Oral, resp. rate 17, height 5\' 8"  (1.727 m), weight 64.5 kg, SpO2 99%.  Medical Problem List and Plan: 1. Functional deficits secondary to multifocal CVAs, complicated by oligodendroma with radiation necrosis             -patient may  shower             -ELOS/Goals: 10 to 12 days, supervision PT/OT/SLP              Con't CIR  PT, OT and SLP  Team conference today to determine LOS.   -will have PA contact Palliative care to see if can get pt to take meds, to help- since he refuses IV or Cortrak- and said would pull them.  2.  Antithrombotics: -DVT/anticoagulation:  Pharmaceutical: Eliquis 5 mg twice daily             -antiplatelet therapy: n/a   3. Pain Management: As needed Tylenol   4. Mood/Behavior/Sleep: No current medications             -antipsychotic agents: n/a             -Sleep log added   3/31- refusing all meds 5. Neuropsych/cognition: This patient is not capable of making decisions on his own behalf.             -Reporting ongoing confusion/delirium.  Delirium precautions placed.               3/31- pt refusing ALL meds, no matter what- getting more agitated- might need IM meds? 6. Skin/Wound Care: Standard precautions and orders.   7. Fluids/Electrolytes/Nutrition: CMET in a.m.  On regular diet.             -On nutritional supplements twice daily with meals             -B12 supplementation 8. Seizure disorder -Seizure precautions added -Last known seizure 3-26 -Lamictal 150 mg every morning, 200 mg every afternoon -As needed IV Ativan 1 to 2 mg for seizures added   3.31- at risk for more seizures and concern for needing to go back to acute- pt just got agitated when discussed- wife agreeable to treatment, but pt threatened to pull any tube.   4/1-  9. Dysphagia -On regular diet and intake; pending SLP assessment   10. type 2 diabetes with hyperglycemia.  SSI 3 times daily AC.   3/31- A1c 5.4- down from 6.3  -will stop CBG's  once he's odin gbetter-  11.  Hypertension.  Continue amlodipine 10 mg daily, losartan 25 mg daily, propranolol 20 mg twice daily.   3/31- pt refusing all meds- at risk for new CVA and possible death- will call Palliative care.  12. Noncompliance-refusing all meds for "3 days".    3/31- sounds like pt perseverating on "overdose" from meds- an idea he got from his mother-will call Palliative care and see what might be able ot be done? Also pt's wife reached out to Dr Barbaraann Cao as well.   Will reach out to Neuro as well to see if they have any ideas?  4/1- reached out to Dr Barbaraann Cao- who is coming today to see pt; Neuro- and palliative care- pt refusing all PO meds except baby ASA possibly, and all IV meds- wife doesn't want pt sedated to give meds- He's now stated per chart and d/w pt (wife not in room this AM)- that he will start meds- 1 med at a time on day 3- and do more every "few days"- continues to perseverate that he's overdosing on meds and needs a break from them- spoke with Nursing at length about this- wife aware that if she seizes, will have to leave CIR and is at increased risk of stroke and possibly death with current lack of medicine. Calling wife to discuss again today- in spite of all discussions,still  refusing all meds. Does recognize today is ending day 2-   I spent a total of  59  minutes on total care today- >50% coordination of care- due to  Team conference as well as multiple conversations- to try and convince pt to take meds- wife still doesn't want him sedated to take meds. At this time. No other options to make him take meds. Also team conference to try and determine length of stay    LOS: 2 days A FACE TO FACE EVALUATION WAS PERFORMED  Arletha Marschke 09/22/2023, 9:23 AM

## 2023-09-22 NOTE — Plan of Care (Signed)
  Problem: RH Balance Goal: LTG Patient will maintain dynamic standing balance (PT) Description: LTG:  Patient will maintain dynamic standing balance with assistance during mobility activities (PT) Flowsheets (Taken 09/21/2023 1805) LTG: Pt will maintain dynamic standing balance during mobility activities with:: Independent with assistive device    Problem: Sit to Stand Goal: LTG:  Patient will perform sit to stand with assistance level (PT) Description: LTG:  Patient will perform sit to stand with assistance level (PT) Flowsheets (Taken 09/21/2023 1805) LTG: PT will perform sit to stand in preparation for functional mobility with assistance level: Independent   Problem: RH Bed to Chair Transfers Goal: LTG Patient will perform bed/chair transfers w/assist (PT) Description: LTG: Patient will perform bed to chair transfers with assistance (PT). Flowsheets (Taken 09/21/2023 1805) LTG: Pt will perform Bed to Chair Transfers with assistance level: Independent   Problem: RH Car Transfers Goal: LTG Patient will perform car transfers with assist (PT) Description: LTG: Patient will perform car transfers with assistance (PT). Flowsheets (Taken 09/21/2023 1805) LTG: Pt will perform car transfers with assist:: Independent with assistive device    Problem: RH Furniture Transfers Goal: LTG Patient will perform furniture transfers w/assist (OT/PT) Description: LTG: Patient will perform furniture transfers  with assistance (OT/PT). Flowsheets (Taken 09/21/2023 1805) LTG: Pt will perform furniture transfers with assist:: Independent   Problem: RH Floor Transfers Goal: LTG Patient will perform floor transfers w/assist (PT) Description: LTG: Patient will perform floor transfers with assistance (PT). Flowsheets (Taken 09/21/2023 1805) LTG: PT WILL PERFORM FLOOR TRANFERS  WITH  ASSIST:: Independent with assistive device    Problem: RH Ambulation Goal: LTG Patient will ambulate in home environment  (PT) Description: LTG: Patient will ambulate in home environment, # of feet with assistance (PT). Flowsheets (Taken 09/21/2023 1805) LTG: Pt will ambulate in home environ  assist needed:: Supervision/Verbal cueing LTG: Ambulation distance in home environment: up to 50 ft per bout using LRAD Goal: LTG Patient will ambulate in community environment (PT) Description: LTG: Patient will ambulate in community environment, # of feet with assistance (PT). Flowsheets (Taken 09/21/2023 1805) LTG: Pt will ambulate in community environ  assist needed:: Supervision/Verbal cueing LTG: Ambulation distance in community environment: more than 500 ft using LRAD   Problem: RH Stairs Goal: LTG Patient will ambulate up and down stairs w/assist (PT) Description: LTG: Patient will ambulate up and down # of stairs with assistance (PT) Flowsheets (Taken 09/21/2023 1805) LTG: Pt will ambulate up/down stairs assist needed:: Independent with assistive device LTG: Pt will  ambulate up and down number of stairs: at least 4 steps using one HR as per home environment

## 2023-09-22 NOTE — Progress Notes (Signed)
 Physical Therapy Session Note  Patient Details  Name: Alan Wise MRN: [id number] Date of Birth: June 18, 1971  Today's Date: 09/22/2023 PT Individual Time: 4098-1191, 4782-9562 PT Individual Time Calculation (min): 45 min , 41 min   Short Term Goals: Week 1:  PT Short Term Goal 1 (Week 1): Pt will perform all standing transfers with improved initial balance and overall supervision. PT Short Term Goal 2 (Week 1): pt will ambulate with no AD and minimal path deviation throughout community distances with overall supervision/ CGA. PT Short Term Goal 3 (Week 1): Pt will complete additional, appropriate outcome measure. PT Short Term Goal 4 (Week 1): Pt will complete floor transfer with overall CGA.  Skilled Therapeutic Interventions/Progress Updates:      Therapy Documentation Precautions:  Precautions Precautions: Fall Precaution/Restrictions Comments: seizures, prior CVA, expressive aphasia, monitor vitals,  L field cut Restrictions Weight Bearing Restrictions Per Provider Order: No   Treatment Session 1:   Pt agreeable to PT session with emphasis on dual tasking with gait to address cognitive deficits and inattention to the left. Pt denies pain, dependent transport by w/c hospital room <>main gym. Pt CGA and progressed to supervision with gait no AD >300'. Pt encouraged to look in directions and scan environment to compensate for left visual field cut. Pt provided with scavenger hunt and requires mod A for attention to task and gestures for locating objects. Pt will benefit from continued cognitive and motor task to prepare for discharge.   Treatment Session 2:   Pt agreeable to PT session with emphasis on cognitive tasks with gait to challenge balance and attention. Pt without reports of pain and supervision with transfers, gait >300' no AD, 8" curb step, and stairs x 12 with 1 HR to simulate home set-up. Pt continued ambulatory scavenger hunt to locate objects and able to name and  identify objects with improved speed with min/mod multi-modal cues. Pt also ambulated throughout gym and able to scan environment to find bean bags, retrieve item with L UE, and state the color. Pt with ~80% accuracy with min cues for color ID. Pt performed similar activity with placement of pegs to make design with designated colors with increased time and min cues. Pt returned to room, left seated at bedside with all needs in reach, alarm on.   Therapy/Group: Individual Therapy  Truitt Leep Truitt Leep PT, DPT  09/22/2023, 12:22 PM

## 2023-09-22 NOTE — Progress Notes (Signed)
 Patient ID: COLBERT CURENTON, male   DOB: [birth year], 53 y.o.   MRN: [id number]    Progress Note from the Palliative Medicine Team at [facility]   Patient Name: Alan Wise        Date: 09/22/2023 DOB: Sep 25, 1970  Age: 53 y.o. MRN#: [id number] Attending Physician: Genice Rouge, MD Primary Care Physician: Lorre Munroe, NP Admit Date: 09/20/2023   Reason for Consultation/Follow-up   Establishing Goals of Care   HPI/ Brief Hospital Review  53 y.o. 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    Workup in the ED including MRI brain showed new punctate focus of restricted diffusion in the anterior insular cortex compatible with a new acute/subacute infarct, stable 8 mm acute/subacute infarct involving the right cerebral peduncle.    Patient was hospitalized for management of seizures and CVA. He was also hospitalized before this from 3-19 to 3-21 with new CVA and breakthrough seizures.   PMT has been consulted to assist with goals of care conversation in the setting of medication noncompliance.  Patient and family face ongoing treatment option decisions, advanced directive decisions and anticipatory care needs.  Subjective  Extensive chart review has been completed prior to meeting with patient/family  including labs, vital signs, imaging, progress/consult notes, orders, medications and available advance directive documents.    This NP assessed patient at the bedside as a follow up to  yesterday's GOCs meeting.  Patient currently out of bed to wheelchair and working with occupational therapy.  He is calm and pleasant currently.   Communication difficult, patient is aphasic, having a difficult time word finding.  He is substituting incorrect words.   Speech is fragmented. I briefly introduced myself visit palliative medicine provider.  Patient's parents were in the hallway.  Brief encounter, patient's mother expressed to me that she saw him much improved today and that she felt that was because "some of the medicines or out of his system".  On further inquiry, she feels that some of the seizure medications are making him loopy and confused.  Parents speak lovingly of their son, they tell me he has a brilliant "math mind."  Education offered regarding the importance of medications in seizure control.      I was able to speak later in the afternoon with his wife/Alan Wise.  Ongoing education regarding the seriousness of his current medical situation.  She understands, she understands the importance of medication compliance to prevent seizure and stroke.     Education offered today regarding  the importance of continued conversation with  medical providers regarding overall plan of care and treatment options,  ensuring decisions are within the context of the patients values and GOCs.  Questions and concerns addressed    PMT will continue to support holistically    Time: 50   minutes  Detailed review of medical records ( labs, imaging, vital signs), medically appropriate exam ( MS, skin, cardiac,  resp)   discussed with treatment team, counseling and education to patient, family, staff, documenting clinical information, medication management, coordination of care    Lorinda Creed NP  Palliative Medicine Team Team Phone # [phone] Pager [phone]

## 2023-09-22 NOTE — Progress Notes (Signed)
 Occupational Therapy Session Note  Patient Details  Name: Alan Wise MRN: [id number] Date of Birth: 09-Jan-1971  Today's Date: 09/22/2023 OT Individual Time: 9562-1308 OT Individual Time Calculation (min): 43 min    Short Term Goals: Week 1:  OT Short Term Goal 1 (Week 1): Pt will complete toilet transfer with CGA OT Short Term Goal 2 (Week 1): Pt will attend to L visual field with min questioning cues 75% of time OT Short Term Goal 3 (Week 1): Pt will complete 3/3 toileting steps with CGA  Skilled Therapeutic Interventions/Progress Updates:      Therapy Documentation Precautions:  Precautions Precautions: Fall Precaution/Restrictions Comments: seizures, prior CVA, expressive aphasia, monitor vitals,  L field cut Restrictions Weight Bearing Restrictions Per Provider Order: No General: "I taught math" Pt seated in W/C upon OT arrival, agreeable to OT.  Pain: no pain reported  Exercises:Pt completed the following exercise circuit in order to improve functional activity, strength and endurance to prepare for ADLs such as bathing. Pt completed the following exercises in standing position with no noted LOB/SOB and 1x10 repetitions on each exercise with 3x completed circuit: -squats with 5# medicine ball at CGA no AD -shoulder raises with 5# medicine ball  -5# ball toss in sitting  Pt requiring Min VC for direction for exercises. Pt able to complete sit to stands and short bouts of ambulation during exercises with SBA no AD and no LOB/SOB.   Other Treatments: OT providing therapeutic use of self in order to build rapport and discuss patient current situation and goals for therapy.   Pt seated in W/C at end of session with W/C alarm donned, call light within reach and 4Ps assessed.    Therapy/Group: Individual Therapy  Velia Meyer, OTD, OTR/L 09/22/2023, 12:20 PM

## 2023-09-22 NOTE — Progress Notes (Signed)
 Speech Language Pathology Daily Session Note  Patient Details  Name: Alan Wise MRN: [id number] Date of Birth: [birth year]  Today's Date: 09/22/2023 SLP Individual Time: 1351-1447 SLP Individual Time Calculation (min): 56 min  Short Term Goals: Week 1: SLP Short Term Goal 1 (Week 1): Pt will utilize multimodal communication (as needed) to express simple wants/needs with maxA SLP Short Term Goal 2 (Week 1): Pt will complete functional naming tasks w/ maxA SLP Short Term Goal 3 (Week 1): Pt will comprehend mildly complex conversation w/ maxA SLP Short Term Goal 4 (Week 1): Pt will complete pharyngeal strengthening exercises w/ modA SLP Short Term Goal 5 (Week 1): Pt will utilize safe swallow strategies during PO intake with modA SLP Short Term Goal 6 (Week 1): Pt will compensate for L visual inattention during functional tasks with maxA  Skilled Therapeutic Interventions: SLP conducted skilled therapy session targeting communication goals. Patient and therapist engaged in various conversation level word finding tasks across session. SLP attempted to incorporate Lingraphica device, however patient reports dislike of device attempts, thus trials were ceased. Patient comprehended mildly complex conversation throughout session with overall min assist. During responsive naming task, patient benefited from max cues to name described items. Patient benefited most readily from phonemic cues. When tasked with word description, patient required max to total assist and provision of field of 2 and 3 choices for word finding. Speech at the conversation level is fluent but largely empty. Patient was left in chair with call bell in reach and chair alarm set. SLP will continue to target goals per plan of care.       Pain Pain Assessment Pain Scale: (P) 0-10 Pain Score: (P) 0-No pain  Therapy/Group: Individual Therapy  Jeannie Done, M.A., CCC-SLP  Yetta Barre 09/22/2023, 3:40 PM

## 2023-09-22 NOTE — Progress Notes (Signed)
 Spoke with patient regarding medications. Patient explained that he feels worse being on medications and wanted to take time off to "detox". Patient expressed that he would take 1 pill on day 3, and 2 pills on day 4. Patient was firm on NOT taking any medications today and asked nurse to end conversation regarding medications. MD notified.

## 2023-09-22 NOTE — Consult Note (Signed)
 [facility] Neuro-Oncology Consult Note  Patient Care Team: Lorre Munroe, NP as PCP - General (Internal Medicine) Homsher, Wynona Canes, RN as VBCI Care Management  CHIEF COMPLAINTS/PURPOSE OF CONSULTATION:  Astrocytoma Focal Seizures  HISTORY OF PRESENTING ILLNESS:  Alan Wise 53 y.o. male presented to rehab following hospital course for post-ictal encephalopathy, questionable new stroke on MRI.  He is improving his functional status and activities of daily living.  At present he is refusing all medications, including seizure prevention medications.    MEDICAL HISTORY:  Past Medical History:  Diagnosis Date   Allergy    Complication of anesthesia    pt reports he is starting to have more difficulty arousing after surgery   Diabetes mellitus (HCC)    GERD (gastroesophageal reflux disease)    Headache    History of kidney stones    Hyperlipidemia    Renal disorder    kidney stones    SURGICAL HISTORY: Past Surgical History:  Procedure Laterality Date   APPLICATION OF CRANIAL NAVIGATION Right 01/17/2021   Procedure: APPLICATION OF CRANIAL NAVIGATION;  Surgeon: Bedelia Person, MD;  Location: [facility] OR;  Service: Neurosurgery;  Laterality: Right;   COLONOSCOPY  09/03/1994   COLONOSCOPY WITH PROPOFOL N/A 01/07/2023   Procedure: COLONOSCOPY WITH PROPOFOL;  Surgeon: [name], MD;  Location: [facility] ENDOSCOPY;  Service: Endoscopy;  Laterality: N/A;  NOT TOO EARLY   CYSTOSCOPY WITH STENT PLACEMENT Right 01/25/2016   Procedure: CYSTOSCOPY WITH STENT PLACEMENT;  Surgeon: Hildred Laser, MD;  Location: [facility] ORS;  Service: Urology;  Laterality: Right;   FRAMELESS  BIOPSY WITH BRAINLAB Right 01/17/2021   Procedure: RIGHT STEREOTACTIC BIOPSY OF INSULAR LESION;  Surgeon: Bedelia Person, MD;  Location: [facility] OR;  Service: Neurosurgery;  Laterality: Right;   KNEE ARTHROSCOPY W/ MENISCAL REPAIR Right 06/23/1988   POLYPECTOMY  01/07/2023   Procedure: POLYPECTOMY;   Surgeon: [name], MD;  Location: [facility] ENDOSCOPY;  Service: Endoscopy;;   removal of birthmark  06/08/2013   SKIN CANCER EXCISION  06/23/2012   TYMPANOSTOMY TUBE PLACEMENT  08/06/1978   URETEROSCOPY WITH HOLMIUM LASER LITHOTRIPSY Right 01/25/2016   Procedure: URETEROSCOPY WITH HOLMIUM LASER LITHOTRIPSY;  Surgeon: Hildred Laser, MD;  Location: [facility] ORS;  Service: Urology;  Laterality: Right;   URETHRAL STRICTURE DILATATION     visual inspection of vocal cord     1973   WISDOM TOOTH EXTRACTION      SOCIAL HISTORY: Social History   Socioeconomic History   Marital status: Married    Spouse name: Not on file   Number of children: Not on file   Years of education: Not on file   Highest education level: Not on file  Occupational History   Not on file  Tobacco Use   Smoking status: Never   Smokeless tobacco: Never  Vaping Use   Vaping status: Never Used  Substance and Sexual Activity   Alcohol use: Not Currently    Comment: rare   Drug use: No   Sexual activity: Not Currently  Other Topics Concern   Not on file  Social History Narrative   Not on file   Social Drivers of Health   Financial Resource Strain: Low Risk  (12/05/2022)   Received from [facility], [facility]   Overall Financial Resource Strain (CARDIA)    Difficulty of Paying Living Expenses: Not hard at all  Food Insecurity: No Food Insecurity (09/20/2023)   Hunger Vital Sign  Worried About Programme researcher, broadcasting/film/video in the Last Year: Never true    Ran Out of Food in the Last Year: Never true  Transportation Needs: No Transportation Needs (09/20/2023)   PRAPARE - Administrator, Civil Service (Medical): No    Lack of Transportation (Non-Medical): No  Physical Activity: Not on file  Stress: Not on file  Social Connections: Moderately Isolated (09/20/2023)   Social Connection and Isolation Panel [NHANES]    Frequency of Communication with Friends and Family: More  than three times a week    Frequency of Social Gatherings with Friends and Family: More than three times a week    Attends Religious Services: Never    Database administrator or Organizations: No    Attends Banker Meetings: Never    Marital Status: Married  Catering manager Violence: Not At Risk (09/20/2023)   Humiliation, Afraid, Rape, and Kick questionnaire    Fear of Current or Ex-Partner: No    Emotionally Abused: No    Physically Abused: No    Sexually Abused: No    FAMILY HISTORY: Family History  Problem Relation Age of Onset   Hyperlipidemia Father    Hypertension Father    Diabetes Father    Benign prostatic hyperplasia Father    Stroke Maternal Grandmother    Diabetes Maternal Grandmother    Stroke Paternal Grandmother    Hypertension Paternal Grandmother    Kidney disease Neg Hx    Prostate cancer Neg Hx     ALLERGIES:  is allergic to contrast media [iodinated contrast media].  MEDICATIONS:  Current Facility-Administered Medications  Medication Dose Route Frequency Provider Last Rate Last Admin   acetaminophen (TYLENOL) tablet 325-650 mg  325-650 mg Oral Q4H PRN Elijah Birk C, DO       acetaminophen (TYLENOL) tablet 650 mg  650 mg Oral Q6H PRN Elijah Birk C, DO       alum & mag hydroxide-simeth (MAALOX/MYLANTA) 200-200-20 MG/5ML suspension 30 mL  30 mL Oral Q4H PRN Elijah Birk C, DO       amLODipine (NORVASC) tablet 10 mg  10 mg Oral Daily Elijah Birk C, DO       apixaban (ELIQUIS) tablet 5 mg  5 mg Oral BID Angelina Sheriff, DO       aspirin EC tablet 81 mg  81 mg Oral Daily Tomie China, MD       atorvastatin (LIPITOR) tablet 40 mg  40 mg Oral Daily Elijah Birk C, DO       bisacodyl (DULCOLAX) suppository 10 mg  10 mg Rectal Daily PRN Elijah Birk C, DO       cholecalciferol (VITAMIN D3) 25 MCG (1000 UNIT) tablet 1,000 Units  1,000 Units Oral Daily Elijah Birk C, DO   1,000 Units at 09/20/23 1748   cyanocobalamin (VITAMIN  B12) tablet 1,000 mcg  1,000 mcg Oral Daily Elijah Birk C, DO       divalproex (DEPAKOTE) DR tablet 500 mg  500 mg Oral Q8H Lovorn, Megan, MD       feeding supplement (ENSURE ENLIVE / ENSURE PLUS) liquid 237 mL  237 mL Oral BID BM Engler, Morgan C, DO       guaiFENesin-dextromethorphan (ROBITUSSIN DM) 100-10 MG/5ML syrup 5-10 mL  5-10 mL Oral Q6H PRN Elijah Birk C, DO       insulin aspart (novoLOG) injection 0-5 Units  0-5 Units Subcutaneous QHS Elijah Birk C, DO  insulin aspart (novoLOG) injection 0-9 Units  0-9 Units Subcutaneous TID WC Elijah Birk C, DO       lamoTRIgine (LAMICTAL) tablet 150 mg  150 mg Oral q morning Elijah Birk C, DO       And   lamoTRIgine (LAMICTAL) tablet 200 mg  200 mg Oral QHS Engler, Morgan C, DO       LORazepam (ATIVAN) injection 1-2 mg  1-2 mg Intravenous PRN Elijah Birk C, DO       losartan (COZAAR) tablet 25 mg  25 mg Oral Daily Engler, Morgan C, DO       melatonin tablet 3 mg  3 mg Oral QHS PRN Elijah Birk C, DO       polyethylene glycol (MIRALAX / GLYCOLAX) packet 17 g  17 g Oral Daily PRN Elijah Birk C, DO       propranolol (INDERAL) tablet 20 mg  20 mg Oral BID Elijah Birk C, DO       sodium phosphate (FLEET) enema 1 enema  1 enema Rectal Once PRN Elijah Birk C, DO       valproate (DEPACON) 2,000 mg in dextrose 5 % 50 mL IVPB  2,000 mg Intravenous Once Lovorn, Megan, MD        REVIEW OF SYSTEMS:   Eyes: Denies blurriness of vision Ears, nose, mouth, throat, and face: Denies mucositis or sore throat Respiratory: Denies cough, dyspnea or wheezes Cardiovascular: Denies palpitation, chest discomfort or lower extremity swelling Gastrointestinal:  Denies nausea, constipation, diarrhea GU: Denies dysuria or incontinence Skin: Denies abnormal skin rashes Neurological: Per HPI Musculoskeletal: Denies joint pain, back or neck discomfort. No decrease in ROM Behavioral/Psych: Denies anxiety, disturbance in thought content, and  mood instability   PHYSICAL EXAMINATION: Vitals:   09/22/23 0519 09/22/23 1350  BP: 134/73 (!) 132/92  Pulse: 70 (!) 109  Resp: 17 16  Temp: 97.9 F (36.6 C) 97.7 F (36.5 C)  SpO2: 99% 92%   KPS: 70. General: Normal Head: Normal EENT: No conjunctival injection or scleral icterus. Oral mucosa moist Lungs: Resp effort normal Cardiac: Regular rate and rhythm Abdomen: Soft, non-distended abdomen Skin: No rashes cyanosis or petechiae. Extremities: No clubbing or edema  NEUROLOGIC EXAM: Mental Status: Awake, alert, attentive to examiner. Oriented to self and environment.  Language is notable for impaired fluency, with additional impairments in comprehension.  Modest psychomotor slowing, impaired 3 object recall Cranial Nerves: Visual acuity is grossly normal. Right field hemianopia. Extra-ocular movements intact. No ptosis. Mild UMN right facial paresis. Motor: Tone and bulk are normal. Power is full in both arms and legs. Reflexes are symmetric, no pathologic reflexes present.  Sensory: Intact to light touch   LABORATORY DATA:  I have reviewed the data as listed Lab Results  Component Value Date   WBC 9.9 09/21/2023   HGB 15.7 09/21/2023   HCT 44.7 09/21/2023   MCV 83.2 09/21/2023   PLT 205 09/21/2023   Recent Labs    09/14/23 1039 09/16/23 0531 09/21/23 0540  NA 138 140 140  K 4.2 3.9 4.3  CL 102 102 101  CO2 30 27 25   GLUCOSE 139* 116* 102*  BUN 15 12 13   CREATININE 1.14 0.99 1.03  CALCIUM 9.2 9.3 9.6  GFRNONAA >60 >60 >60  PROT 6.8 6.5 6.4*  ALBUMIN 4.1 3.8 3.8  AST 20 20 19   ALT 22 24 29   ALKPHOS 70 70 71  BILITOT 0.8 0.7 0.6    RADIOGRAPHIC STUDIES: I have personally reviewed the  radiological images as listed and agreed with the findings in the report. DG Swallowing Func-Speech Pathology Result Date: 09/16/2023 Table formatting from the original result was not included. Modified Barium Swallow Study Patient Details Name: EREN RYSER MRN:  [id number] Date of Birth: December 22, 1970 Today's Date: 09/16/2023 HPI/PMH: HPI: 53 yr old presenting 3/24 with difficulty walking and swallowing. MRI brain revealed new punctate focus of restricted diffusion in the anterior insular cortex compatible with new acute/subacute infarct. Recent admission to [facility] ED 3/19-3/21 with AMS and new CVA. 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 Wife says aphasia initiated a year ago slowely decling but feels he is at his baseline with aphasia and is receiving outpatient ST per previous SLP note. BSE 09/10/23 initial cough with liquids not repeated with additional trials, complete mastication and clearance with regular textures. Recommmende regular/thin Clinical Impression: Clinical Impression: Pt demonstrates pharyngoesophageal and suspected esophageal dysphagia with DIGEST score of 2 (moderate impairment). Pt's pharyngoesophageal segment closes prematurely causing consistent backflow into his pyriform sinuses throughout the study and esophagus appeared to remain open after the swallow on several occasions. He had residue in his proximal esophagus with thicker consistencies in addition to backflow possibly explaining sudden coughing during meals. A Mendelsohn maneuver was attempted to prolong PES opening however he was unable to adequately perform. Material in the esophagus ascended several times with mild amount reaching pharynx. At the end of the study with thin barium there was penetration to the vocal cords that cleared however when fluoro turned back on aspiration was observed and uncertain if from pyriform sinus residue or from possible reflux. Min-mild pyriform sinus residue consistently present not completely cleared with second swallows. Mastication of regular texture was timely without residual. Recommend upgrade to regular and educated wife to order textures he can masticate easier as he  does have decreased sensation on left side oral cavity. Continue thin liquids, meds whole in applesauce, pt prefers cups over straws, alternate liquids and solids and place food on right side of oral cavity. ST will continue to follow and trial exercises to promote PES opening. Pt may benefit from further esophageal assessment with GI consult. Factors that may increase risk of adverse event in presence of aspiration Rubye Oaks & Clearance Coots 2021): No data recorded Recommendations/Plan: Swallowing Evaluation Recommendations Swallowing Evaluation Recommendations Recommendations: PO diet PO Diet Recommendation: Regular; Thin liquids (Level 0) Liquid Administration via: Cup (pt prefers cup over straw) Medication Administration: Whole meds with puree Supervision: Patient able to self-feed; Full supervision/cueing for swallowing strategies Swallowing strategies  : Slow rate; Small bites/sips; Follow solids with liquids; Check for pocketing or oral holding Postural changes: Position pt fully upright for meals; Stay upright 30-60 min after meals Oral care recommendations: Oral care BID (2x/day) Recommended consults: Consider GI consultation; Consider esophageal assessment Treatment Plan Treatment Plan Treatment recommendations: Therapy as outlined in treatment plan below Follow-up recommendations: -- (TBD) Functional status assessment: Patient has had a recent decline in their functional status and demonstrates the ability to make significant improvements in function in a reasonable and predictable amount of time. Treatment frequency: Min 2x/week Treatment duration: 2 weeks Interventions: Diet toleration management by SLP; Patient/family education; Compensatory techniques; Other (comment) (trial of Mendelsohn and Shaker) Recommendations Recommendations for follow up therapy are one component of a multi-disciplinary discharge planning process, led by the attending physician.  Recommendations may be updated based on patient status,  additional functional criteria and insurance authorization. Assessment: Orofacial Exam: Orofacial  Exam Oral Cavity: Oral Hygiene: WFL Oral Cavity - Dentition: Adequate natural dentition Orofacial Anatomy: WFL Oral Motor/Sensory Function: Suspected cranial nerve impairment CN VII - Facial: Left motor impairment Anatomy: Anatomy: Prominent cricopharyngeus Boluses Administered: Boluses Administered Boluses Administered: Thin liquids (Level 0); Mildly thick liquids (Level 2, nectar thick); Moderately thick liquids (Level 3, honey thick); Puree; Solid  Oral Impairment Domain: Oral Impairment Domain Lip Closure: No labial escape Tongue control during bolus hold: Cohesive bolus between tongue to palatal seal Bolus preparation/mastication: Timely and efficient chewing and mashing Bolus transport/lingual motion: Brisk tongue motion Initiation of pharyngeal swallow : Pyriform sinuses  Pharyngeal Impairment Domain: Pharyngeal Impairment Domain Soft palate elevation: No bolus between soft palate (SP)/pharyngeal wall (PW) Laryngeal elevation: Complete superior movement of thyroid cartilage with complete approximation of arytenoids to epiglottic petiole Anterior hyoid excursion: Complete anterior movement Epiglottic movement: Complete inversion Laryngeal vestibule closure: Incomplete, narrow column air/contrast in laryngeal vestibule Pharyngeal stripping wave : Present - complete Pharyngeal contraction (A/P view only): N/A Pharyngoesophageal segment opening: Partial distention/partial duration, partial obstruction of flow Tongue base retraction: No contrast between tongue base and posterior pharyngeal wall (PPW) Pharyngeal residue: Collection of residue within or on pharyngeal structures Location of pharyngeal residue: Pyriform sinuses  Esophageal Impairment Domain: Esophageal Impairment Domain Esophageal clearance upright position: Esophageal retention with retrograde flow through the PES Pill: No data recorded  Penetration/Aspiration Scale Score: Penetration/Aspiration Scale Score 1.  Material does not enter airway: Solid; Puree; Moderately thick liquids (Level 3, honey thick); Mildly thick liquids (Level 2, nectar thick) 5.  Material enters airway, CONTACTS cords and not ejected out: Thin liquids (Level 0) 8.  Material enters airway, passes BELOW cords without attempt by patient to eject out (silent aspiration) : Thin liquids (Level 0) Compensatory Strategies: Compensatory Strategies Compensatory strategies: Yes Effortful swallow: Ineffective Ineffective Effortful Swallow: Puree Left head turn: Ineffective Ineffective Left Head Turn: Thin liquid (Level 0) Mendelsohn : Effective Effective Mendelsohn: Thin liquid (Level 0)   General Information: Caregiver present: No  Diet Prior to this Study: Dysphagia 3 (mechanical soft); Thin liquids (Level 0)   Temperature : Normal   Respiratory Status: WFL   Supplemental O2: None (Room air)   History of Recent Intubation: No  Behavior/Cognition: Alert; Cooperative; Pleasant mood Self-Feeding Abilities: Able to self-feed Baseline vocal quality/speech: Normal Volitional Cough: Able to elicit No data recorded Exam Limitations: No limitations Goal Planning: Prognosis for improved oropharyngeal function: -- (fair-good) Barriers to Reach Goals: Other (Comment) (pt has aphasia) No data recorded No data recorded Consulted and agree with results and recommendations: Patient; Family member/caregiver; Physician Pain: Pain Assessment Pain Assessment: Faces Faces Pain Scale: 0 End of Session: Start Time:SLP Start Time (ACUTE ONLY): 1208 Stop Time: SLP Stop Time (ACUTE ONLY): 1228 Time Calculation:SLP Time Calculation (min) (ACUTE ONLY): 20 min Charges: SLP Evaluations $ SLP Speech Visit: 1 Visit SLP Evaluations $BSS Swallow: 1 Procedure $MBS Swallow: 1 Procedure SLP visit diagnosis: SLP Visit Diagnosis: Dysphagia, pharyngoesophageal phase (R13.14) Past Medical History: Past Medical History:  Diagnosis Date  Allergy   Complication of anesthesia   pt reports he is starting to have more difficulty arousing after surgery  Diabetes mellitus (HCC)   GERD (gastroesophageal reflux disease)   Headache   History of kidney stones   Hyperlipidemia   Renal disorder   kidney stones Past Surgical History: Past Surgical History: Procedure Laterality Date  APPLICATION OF CRANIAL NAVIGATION Right 01/17/2021  Procedure: APPLICATION OF CRANIAL NAVIGATION;  Surgeon: Bedelia Person, MD;  Location: [facility]  OR;  Service: Neurosurgery;  Laterality: Right;  COLONOSCOPY  09/03/1994  COLONOSCOPY WITH PROPOFOL N/A 01/07/2023  Procedure: COLONOSCOPY WITH PROPOFOL;  Surgeon: [name], MD;  Location: [facility] ENDOSCOPY;  Service: Endoscopy;  Laterality: N/A;  NOT TOO EARLY  CYSTOSCOPY WITH STENT PLACEMENT Right 01/25/2016  Procedure: CYSTOSCOPY WITH STENT PLACEMENT;  Surgeon: Hildred Laser, MD;  Location: [facility] ORS;  Service: Urology;  Laterality: Right;  FRAMELESS  BIOPSY WITH BRAINLAB Right 01/17/2021  Procedure: RIGHT STEREOTACTIC BIOPSY OF INSULAR LESION;  Surgeon: Bedelia Person, MD;  Location: [facility] OR;  Service: Neurosurgery;  Laterality: Right;  KNEE ARTHROSCOPY W/ MENISCAL REPAIR Right 06/23/1988  POLYPECTOMY  01/07/2023  Procedure: POLYPECTOMY;  Surgeon: [name], MD;  Location[facility] ENDOSCOPY;  Service: Endoscopy;;  removal of birthmark  06/08/2013  SKIN CANCER EXCISION  06/23/2012  TYMPANOSTOMY TUBE PLACEMENT  08/06/1978  URETEROSCOPY WITH HOLMIUM LASER LITHOTRIPSY Right 01/25/2016  Procedure: URETEROSCOPY WITH HOLMIUM LASER LITHOTRIPSY;  Surgeon: Hildred Laser, MD;  Location: [facility] ORS;  Service: Urology;  Laterality: Right;  URETHRAL STRICTURE DILATATION    visual inspection of vocal cord    1973  WISDOM TOOTH EXTRACTION   [name] 09/16/2023, 3:36 PM  MR BRAIN W CONTRAST Result Date: 09/15/2023 CLINICAL DATA:  53 year old male with a history of oligodendroglioma, Avastin therapy, indeterminate new  diffusion restricted lesions in the brain since 09/09/2023. EXAM: MRI HEAD WITH CONTRAST TECHNIQUE: Multiplanar, multiecho pulse sequences of the brain and surrounding structures were obtained with intravenous contrast. CONTRAST:  7mL GADAVIST GADOBUTROL 1 MMOL/ML IV SOLN COMPARISON:  Noncontrast brain MRI yesterday and earlier FINDINGS: Heterogeneous enhancement of the chronic infiltrative right frontal lobe predominant tumor is stable compared to 09/07/2023. There remains regional ex vacuo enlargement of the right lateral ventricle. The punctate site of right insula enhancement on 09/07/2023 is now enhancing on series 6, image 26. And similar enhancement of the punctate left mesial temporal lobe diffusion restricted lesion seen at that time. Both of those areas have faded on DWI yesterday. However, ongoing and intense abnormal diffusion at the right cerebral peduncle (yesterday), not significantly changed over this series of exams, and with questionable petechial enhancement there now on this exam series 6, image 20. No other new areas of enhancement are identified. The major dural venous sinuses are enhancing and appear to be patent. IMPRESSION: Constellation of findings over this series of multiple MRIs without and with contrast since 09/07/2023 suspicious for: - infiltrative tumor progression into the right cerebral peduncle. - superimposed scattered acute and subacute lacunar infarcts, with several from 09/07/2023 now faded on diffusion and with discrete post-ischemic appearing enhancement. Recommend continued MRI surveillance without and with contrast ( 4-6 weeks suggested time frame) to further evaluate the evolution of the above. Electronically Signed   By: Odessa Fleming M.D.   On: 09/15/2023 15:01   Overnight EEG with video Result Date: 09/15/2023 Charlsie Quest, MD     09/16/2023  9:51 AM Patient Name: Alan Wise MRN: [id number] Epilepsy Attending: Charlsie Quest Referring Physician/Provider:  Charlsie Quest, MD Duration: 09/14/2023 1503 to 09/15/2023 1503 Patient history: 53yo M with hx of oligodendroglioma followed by Dr. 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 EEG to evaluate for seizure Level of alertness: Awake, asleep AEDs during EEG study: LCM Technical aspects: This EEG study was done with scalp electrodes positioned according to the 10-20 International system of  electrode placement. Electrical activity was reviewed with band pass filter of 1-70Hz , sensitivity of 7 uV/mm, display speed of 6mm/sec with a 60Hz  notched filter applied as appropriate. EEG data were recorded continuously and digitally stored.  Video monitoring was available and reviewed as appropriate. Description: The posterior dominant rhythm consists of 8-9 Hz activity of moderate voltage (25-35 uV) seen predominantly in posterior head regions, asymmetric ( right<right )  and reactive to eye opening and eye closing. Sleep was characterized by vertex waves, sleep spindles (12 to 14 Hz), maximal frontocentral region. EEG showed continuous 3 to 6 Hz theta-delta slowing in right hemisphere, maximal right temporal region. Hyperventilation and photic stimulation were not performed.   Event button was pressed on 09/15/2023 at around 1015 and 1234 for right leg shaking and altered mental status.  Concomitant EEG before, during and after the event did not show any EEG changes suggest seizure.  ABNORMALITY - Continuous slow, right hemisphere, maximal right temporal region  IMPRESSION: This study is suggestive of cortical dysfunction arising from ight hemisphere, maximal right temporal region likely secondary to underlying structural abnormality. No seizures or epileptiform discharges were seen throughout the recording. Event button was pressed on 09/15/2023 at around 1015 and 1234 for right  leg shaking and altered mental status without concomitant EEG change.  This could have been a nonepileptic event.  However focal motor seizures may not be seen on scalp EEG.  Therefore clinical correlation is recommended.  Charlsie Quest   MR BRAIN WO CONTRAST Result Date: 09/14/2023 CLINICAL DATA:  Neuro deficit, acute, stroke suspected. Progressive left-sided weakness. Progressive dysphasia. EXAM: MRI HEAD WITHOUT CONTRAST TECHNIQUE: Multiplanar, multiecho pulse sequences of the brain and surrounding structures were obtained without intravenous contrast. COMPARISON:  MR head 09/09/2023. FINDINGS: Brain: The diffusion-weighted images redemonstrate an 8 mm acute/subacute infarct involving the right cerebral peduncle. Previously noted restricted diffusion in the left hippocampus, potentially related to seizure activity has resolved. A new punctate focus of restricted diffusion is present in the anterior insular cortex. Restricted diffusion in the anterior right frontal lobe and along the atrium of the right lateral ventricle is similar the prior study. Diffusion changes within the genu of the corpus callosum have improved. No new hemorrhage is present. No new foci of restricted diffusion are present. Periventricular T2 hyperintensities in the left hemisphere are stable. Chronic encephalomalacia of the right frontal operculum is stable. White matter changes extend along the right cerebral peduncle into the pons. The brainstem and cerebellum are otherwise within normal limits. Ex vacuo dilation of the right lateral ventricle is noted. No significant extraaxial fluid collection is present. The internal auditory canals are within normal limits. Vascular: Insert normal flow Skull and upper cervical spine: The craniocervical junction is normal. Upper cervical spine is within normal limits. Marrow signal is unremarkable. Sinuses/Orbits: The paranasal sinuses and mastoid air cells are clear. Bilateral lens replacements  are noted. Globes and orbits are otherwise unremarkable. Other: IMPRESSION: 1. New punctate focus of restricted diffusion in the anterior insular cortex compatible with a new acute/subacute infarct. 2. 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 Tumor is considered less likely. 3. Stable restricted diffusion in the anterior right frontal lobe and along the atrium of the right lateral ventricle consistent with treated tumor. 4. Improved diffusion changes within the genu of the corpus callosum suggesting these were ischemic. 5. Stable chronic encephalomalacia of the right frontal operculum. 6. Stable periventricular T2 hyperintensities in the left hemisphere. This likely reflects the sequela  of chronic microvascular ischemia. Electronically Signed   By: Marin Roberts M.D.   On: 09/14/2023 15:32   ECHOCARDIOGRAM COMPLETE Result Date: 09/10/2023    ECHOCARDIOGRAM REPORT   Patient Name:   MICHAEL WALRATH Date of Exam: 09/10/2023 Medical Rec #:  [id number]         Height:       68.0 in Accession #:    [id number]        Weight:       148.1 lb Date of Birth:  1970/10/09        BSA:          1.799 m Patient Age:    52 years          BP:           138/81 mmHg Patient Gender: M                 HR:           55 bpm. Exam Location:  [facility] Procedure: 2D Echo, Cardiac Doppler, Color Doppler and Saline Contrast Bubble            Study (Both Spectral and Color Flow Doppler were utilized during            procedure). Indications:     Stroke I63.9  History:         Patient has prior history of Echocardiogram examinations, most                  recent 01/15/2022. Risk Factors:Diabetes and Dyslipidemia.  Sonographer:     Cristela Blue Referring Phys:  [id number] AMY N COX Diagnosing Phys: Julien Nordmann MD  Sonographer Comments: No parasternal window and no subcostal window. IMPRESSIONS  1. Left ventricular ejection fraction, by estimation, is 55 to 60%. The left ventricle has normal function. The left  ventricle has no regional wall motion abnormalities. Left ventricular diastolic parameters are consistent with Grade I diastolic dysfunction (impaired relaxation).  2. Right ventricular systolic function is normal. The right ventricular size is normal. There is normal pulmonary artery systolic pressure. The estimated right ventricular systolic pressure is 25.8 mmHg.  3. The mitral valve is normal in structure. No evidence of mitral valve regurgitation. No evidence of mitral stenosis.  4. The aortic valve is normal in structure. Aortic valve regurgitation is not visualized. No aortic stenosis is present.  5. The inferior vena cava is normal in size with greater than 50% respiratory variability, suggesting right atrial pressure of 3 mmHg.  6. Agitated saline contrast bubble study was negative, with no evidence of any interatrial shunt. FINDINGS  Left Ventricle: Left ventricular ejection fraction, by estimation, is 55 to 60%. The left ventricle has normal function. The left ventricle has no regional wall motion abnormalities. Strain was performed and the global longitudinal strain is indeterminate. The left ventricular internal cavity size was normal in size. There is no left ventricular hypertrophy. Left ventricular diastolic parameters are consistent with Grade I diastolic dysfunction (impaired relaxation). Right Ventricle: The right ventricular size is normal. No increase in right ventricular wall thickness. Right ventricular systolic function is normal. There is normal pulmonary artery systolic pressure. The tricuspid regurgitant velocity is 2.28 m/s, and  with an assumed right atrial pressure of 5 mmHg, the estimated right ventricular systolic pressure is 25.8 mmHg. Left Atrium: Left atrial size was normal in size. Right Atrium: Right atrial size was normal in size. Pericardium: There is no evidence of pericardial effusion. Mitral Valve:  The mitral valve is normal in structure. No evidence of mitral valve  regurgitation. No evidence of mitral valve stenosis. MV peak gradient, 2.2 mmHg. The mean mitral valve gradient is 1.0 mmHg. Tricuspid Valve: The tricuspid valve is normal in structure. Tricuspid valve regurgitation is not demonstrated. No evidence of tricuspid stenosis. Aortic Valve: The aortic valve is normal in structure. Aortic valve regurgitation is not visualized. No aortic stenosis is present. Aortic valve mean gradient measures 2.0 mmHg. Aortic valve peak gradient measures 4.0 mmHg. Aortic valve area, by VTI measures 3.01 cm. Pulmonic Valve: The pulmonic valve was normal in structure. Pulmonic valve regurgitation is not visualized. No evidence of pulmonic stenosis. Aorta: The aortic root is normal in size and structure. Venous: The inferior vena cava is normal in size with greater than 50% respiratory variability, suggesting right atrial pressure of 3 mmHg. IAS/Shunts: No atrial level shunt detected by color flow Doppler. Agitated saline contrast was given intravenously to evaluate for intracardiac shunting. Agitated saline contrast bubble study was negative, with no evidence of any interatrial shunt. There  is no evidence of a patent foramen ovale. There is no evidence of an atrial septal defect. Additional Comments: 3D was performed not requiring image post processing on an independent workstation and was indeterminate.  LEFT VENTRICLE PLAX 2D LVIDd:         4.10 cm   Diastology LVIDs:         2.60 cm   LV e' medial:    5.33 cm/s LV PW:         1.20 cm   LV E/e' medial:  11.8 LV IVS:        1.10 cm   LV e' lateral:   6.85 cm/s LVOT diam:     2.00 cm   LV E/e' lateral: 9.2 LV SV:         52 LV SV Index:   29 LVOT Area:     3.14 cm  RIGHT VENTRICLE RV Basal diam:  2.10 cm RV Mid diam:    2.00 cm RV S prime:     11.60 cm/s TAPSE (M-mode): 1.6 cm LEFT ATRIUM           Index        RIGHT ATRIUM          Index LA diam:      3.30 cm 1.83 cm/m   RA Area:     7.43 cm LA Vol (A2C): 21.5 ml 11.95 ml/m  RA  Volume:   11.30 ml 6.28 ml/m LA Vol (A4C): 13.1 ml 7.28 ml/m  AORTIC VALVE AV Area (Vmax):    2.78 cm AV Area (Vmean):   2.94 cm AV Area (VTI):     3.01 cm AV Vmax:           100.00 cm/s AV Vmean:          59.800 cm/s AV VTI:            0.172 m AV Peak Grad:      4.0 mmHg AV Mean Grad:      2.0 mmHg LVOT Vmax:         88.60 cm/s LVOT Vmean:        56.000 cm/s LVOT VTI:          0.165 m LVOT/AV VTI ratio: 0.96  AORTA Ao Root diam: 2.60 cm MITRAL VALVE               TRICUSPID VALVE MV Area (PHT):  3.58 cm    TR Peak grad:   20.8 mmHg MV Area VTI:   2.50 cm    TR Vmax:        228.00 cm/s MV Peak grad:  2.2 mmHg MV Mean grad:  1.0 mmHg    SHUNTS MV Vmax:       0.75 m/s    Systemic VTI:  0.16 m MV Vmean:      49.3 cm/s   Systemic Diam: 2.00 cm MV Decel Time: 212 msec MV E velocity: 62.90 cm/s MV A velocity: 75.50 cm/s MV E/A ratio:  0.83 Julien Nordmann MD Electronically signed by Julien Nordmann MD Signature Date/Time: 09/10/2023/11:37:44 AM    Final    MR BRAIN WO CONTRAST Result Date: 09/10/2023 CLINICAL DATA:  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 EXAM: MRI HEAD WITHOUT CONTRAST TECHNIQUE: Multiplanar, multiecho pulse sequences of the brain and surrounding structures were obtained without intravenous contrast. COMPARISON:  Comparison made with CT from earlier the same day as well as multiple previous studies, including recent brain MRI from 09/07/2023 FINDINGS: Brain: Cerebral volume loss with mild chronic microvascular ischemic disease, stable. Few scattered superimposed remote lacunar infarcts noted about the pons. Previously noted areas of diffusion signal abnormality involving the anterior genu of the corpus callosum, right insula, and mesial left temporal lobe are slightly decreased in prominence from prior, likely reflecting small evolving subacute infarcts. No associated hemorrhage or mass effect. Additional 7 mm focus of diffusion signal abnormality at the right cerebral  peduncle is relatively stable, and remains indeterminate, possibly reflecting ischemic change and/or tumor involvement. No other new or interval areas of infarction. Multifocal signal abnormality involving the right frontal and temporal lobes, consistent with known brain tumor. Few scattered foci of associated cystic encephalomalacia with associated T2/FLAIR signal. Associated diffusion signal abnormality is relatively stable. Scattered areas of intrinsic precontrast T1 hyperintensity and susceptibility artifact are also not appreciably changed, likely reflecting post treatment changes and/or chronic blood products. No evidence for new intracranial hemorrhage by MRI. Previously noted hyperdensity on prior CT corresponds with these signal changes. Although these changes on CT from earlier today are new as compared to most recent CT from 05/13/2023, these changes not appreciably changed from intervening brain MRIs, with no evidence for new or interval hemorrhage. No other mass lesion or mass effect. Stable ventricular size and morphology without hydrocephalus. No extra-axial fluid collection. Pituitary gland within normal limits. No other intrinsic temporal lobe abnormality. Vascular: Major intracranial vascular flow voids are maintained. Skull and upper cervical spine: Craniocervical junction within normal limits. Bone marrow signal intensity normal. No scalp soft tissue abnormality. Sinuses/Orbits: Globes and orbital soft tissues within normal limits. Mild mucosal thickening present about the ethmoidal air cells and maxillary sinuses, slightly worse on the left. No significant mastoid effusion. Other: None. IMPRESSION: 1. No acute intracranial abnormality. 2. Previously described subcentimeter areas of diffusion signal abnormality (seen on recent brain MRI from 09/07/2023) involving the anterior genu of the corpus callosum, right insula, and mesial left temporal lobe are slightly decreased in prominence from prior,  likely reflecting evolving subacute infarcts. No associated hemorrhage or mass effect. No areas of new or interval infarction. 3. Additional 7 mm focus of diffusion signal abnormality at the right cerebral peduncle is relatively stable, and remains indeterminate, possibly reflecting ischemic change and/or tumor involvement. Attention at follow-up recommended. 4. Multifocal signal abnormality involving the right frontal and temporal lobes, consistent with known brain tumor. Overall appearance is not significantly changed, with no  evidence for new or interval hemorrhage by MRI. 5. Underlying atrophy with chronic microvascular ischemic disease, with a few scattered remote lacunar infarcts about the pons. Electronically Signed   By: Rise Mu M.D.   On: 09/10/2023 00:51   CT HEAD WO CONTRAST ( ) Result Date: 09/09/2023 CLINICAL DATA:  History of oligodendroglioma, presenting with confusion and chest tightness. EXAM: CT HEAD WITHOUT CONTRAST TECHNIQUE: Contiguous axial images were obtained from the base of the skull through the vertex without intravenous contrast. RADIATION DOSE REDUCTION: This exam was performed according to the departmental dose-optimization program which includes automated exposure control, adjustment of the mA and/or kV according to patient size and/or use of iterative reconstruction technique. COMPARISON:  May 13, 2023 FINDINGS: Brain: There is generalized cerebral atrophy with widening of the extra-axial spaces and ventricular dilatation. There are areas of decreased attenuation within the white matter tracts of the supratentorial brain, consistent with microvascular disease changes. Post treatment changes are again seen within the right frontal lobe. A mild-to-moderate amount of curvilinear increased attenuation (approximately 57.92 Hounsfield units) is also seen within this region, along the junction of the cortex and white matter. Mild involvement of the right parietal lobe  is noted. There is no evidence of associated mass effect or midline shift. Vascular: Marked severity bilateral cavernous carotid artery calcification is noted. Skull: Normal. Negative for fracture or focal lesion. Sinuses/Orbits: There is marked severity left ethmoid sinus mucosal thickening. Other: None. IMPRESSION: 1. Post treatment changes within the right frontal lobe with a mild-to-moderate amount of curvilinear increased attenuation within this region, as described above. Given the patient's history of oligodendroglioma, this may represent areas of post treatment mineralization and calcification. A small amount of acute hemorrhage cannot completely be excluded. MRI correlation is recommended. 2. Generalized cerebral atrophy and microvascular disease changes of the supratentorial brain. 3. Marked severity left ethmoid sinus disease. Electronically Signed   By: Aram Candela M.D.   On: 09/09/2023 21:11   MR ANGIO NECK W WO CONTRAST Result Date: 09/09/2023 CLINICAL DATA:  Follow-up examination for stroke. EXAM: MRA NECK WITHOUT AND WITH CONTRAST MRA HEAD WITHOUT CONTRAST TECHNIQUE: Multiplanar and multiecho pulse sequences of the neck were obtained without and with intravenous contrast. Angiographic images of the neck were obtained using MRA technique without and with intravenous contrast; Angiographic images of the Circle of Willis were obtained using MRA technique without intravenous contrast. CONTRAST:  6mL GADAVIST GADOBUTROL 1 MMOL/ML IV SOLN COMPARISON:  Comparison made with brain MRI from 09/07/2023. FINDINGS: MRA NECK FINDINGS Aortic arch: Visualized aortic arch within normal limits for caliber with standard 3 vessel morphology. No stenosis about the origin of the great vessels. Right carotid system: Right common and internal carotid arteries are patent with antegrade flow. No dissection or hemodynamically significant stenosis about the right carotid artery system. Left carotid system: Left common  and internal carotid arteries are patent with antegrade flow. No dissection or hemodynamically significant stenosis about the left carotid artery system. Vertebral arteries: Both vertebral arteries arise from subclavian arteries. Vertebral arteries are patent with antegrade flow. No dissection or hemodynamically significant stenosis. Other: None. MRA HEAD FINDINGS Anterior circulation: Both internal carotid arteries are patent to the termini without stenosis or other abnormality. Right A1 segment patent. Left A1 hypoplastic and/or absent. Normal anterior communicating artery complex. Anterior cerebral arteries patent without stenosis. No M1 stenosis or occlusion. Distal MCA branches perfused and symmetric. Posterior circulation: Visualized V4 segments patent without stenosis. Right PICA patent. Left PICA not seen. Basilar patent  without stenosis. Superior cerebral arteries patent bilaterally. Fetal type origin of the PCAs bilaterally. Both PCAs patent to their distal aspects without significant stenosis. Anatomic variants: As above. Other: No intracranial aneurysm. IMPRESSION: Normal MRA of the head and neck. No large vessel occlusion, hemodynamically significant stenosis, or other acute vascular abnormality. Electronically Signed   By: Rise Mu M.D.   On: 09/09/2023 20:17   MR ANGIO HEAD WO CONTRAST Result Date: 09/09/2023 CLINICAL DATA:  Follow-up examination for stroke. EXAM: MRA NECK WITHOUT AND WITH CONTRAST MRA HEAD WITHOUT CONTRAST TECHNIQUE: Multiplanar and multiecho pulse sequences of the neck were obtained without and with intravenous contrast. Angiographic images of the neck were obtained using MRA technique without and with intravenous contrast; Angiographic images of the Circle of Willis were obtained using MRA technique without intravenous contrast. CONTRAST:  6mL GADAVIST GADOBUTROL 1 MMOL/ML IV SOLN COMPARISON:  Comparison made with brain MRI from 09/07/2023. FINDINGS: MRA NECK  FINDINGS Aortic arch: Visualized aortic arch within normal limits for caliber with standard 3 vessel morphology. No stenosis about the origin of the great vessels. Right carotid system: Right common and internal carotid arteries are patent with antegrade flow. No dissection or hemodynamically significant stenosis about the right carotid artery system. Left carotid system: Left common and internal carotid arteries are patent with antegrade flow. No dissection or hemodynamically significant stenosis about the left carotid artery system. Vertebral arteries: Both vertebral arteries arise from subclavian arteries. Vertebral arteries are patent with antegrade flow. No dissection or hemodynamically significant stenosis. Other: None. MRA HEAD FINDINGS Anterior circulation: Both internal carotid arteries are patent to the termini without stenosis or other abnormality. Right A1 segment patent. Left A1 hypoplastic and/or absent. Normal anterior communicating artery complex. Anterior cerebral arteries patent without stenosis. No M1 stenosis or occlusion. Distal MCA branches perfused and symmetric. Posterior circulation: Visualized V4 segments patent without stenosis. Right PICA patent. Left PICA not seen. Basilar patent without stenosis. Superior cerebral arteries patent bilaterally. Fetal type origin of the PCAs bilaterally. Both PCAs patent to their distal aspects without significant stenosis. Anatomic variants: As above. Other: No intracranial aneurysm. IMPRESSION: Normal MRA of the head and neck. No large vessel occlusion, hemodynamically significant stenosis, or other acute vascular abnormality. Electronically Signed   By: Rise Mu M.D.   On: 09/09/2023 20:17   DG Chest Port 1 View Result Date: 09/09/2023 CLINICAL DATA:  [id number] Chest pain [id number] EXAM: PORTABLE CHEST 1 VIEW COMPARISON:  04/11/2023 FINDINGS: Bilateral lung fields are clear. Bilateral costophrenic angles are clear. Normal cardio-mediastinal  silhouette. No acute osseous abnormalities. The soft tissues are within normal limits. IMPRESSION: No active disease. Electronically Signed   By: Jules Schick M.D.   On: 09/09/2023 15:09   MR BRAIN W WO CONTRAST Result Date: 09/07/2023 CLINICAL DATA:  Provided history: Oligodendroglioma. Brain/CNS neoplasm, assess treatment response. Additional history provided by the scanning technologist: Multiple falls, vision loss in left eye, aphasia. Avastin therapy. Restaging. EXAM: MRI HEAD WITHOUT AND WITH CONTRAST TECHNIQUE: Multiplanar, multiecho pulse sequences of the brain and surrounding structures were obtained without and with intravenous contrast. CONTRAST:  6mL GADAVIST GADOBUTROL 1 MMOL/ML IV SOLN COMPARISON:  Brain MRI 07/06/2023. FINDINGS: Brain: Mild generalized cerebral atrophy. 9 mm focus of restricted diffusion with associated T2 FLAIR hyperintense signal abnormality in the right cerebral peduncle (series 5, image 73). 5 mm focus of restricted diffusion with associated T2 FLAIR hyperintense signal abnormality in the anterior left temporal lobe (series 5, image 74). 3 mm focus of restricted diffusion  within the callosal genu at midline (series 5, image 81). 5 mm focus of restricted diffusion within the right insula with associated T2 FLAIR hyperintense signal abnormality. These foci are new as compared to the brain MRI of 07/06/2023 and may reflect acute/early subacute infarcts. However, new sites of tumor cannot be excluded. There is a new 3 mm focus of enhancement in the right subinsular region (series 16, image 90). No other new or progressive foci of enhancement are identified. An irregular focus of restricted diffusion within the right retrolenticular/periatrial region has mildly increased in extent since the prior MRI. A region of restricted diffusion within the mid and anterior right frontal lobe (along the margins of the right lateral ventricle) has also slightly increased in extent (for  instance, extending more medially within the body of the corpus callosum on series 10, image 25). T2 FLAIR hyperintense signal abnormality within the right cerebral hemisphere has not significantly changed, nor has T2 FLAIR hyperintense signal abnormality within the body/genu of corpus callosum and left frontal lobe periventricular white matter. Unchanged parenchymal swelling at the right insula. Superimposed foci of chronic blood products within the right frontal lobe have increased in extent. Redemonstrated focus of cystic encephalomalacia at the anterior right insula. Chronic lacunar infarcts again demonstrated within the left aspect of the pons. Mild multifocal T2 FLAIR hyperintense signal abnormality elsewhere within the cerebral white matter and pons, nonspecific but compatible chronic small vessel ischemic disease. No extra-axial fluid collection. No midline shift. Vascular: Maintained flow voids within the proximal large arterial vessels. Skull and upper cervical spine: No focal worrisome marrow lesion. Right frontoparietal burr hole. Sinuses/Orbits: No focal worrisome marrow lesion. Incompletely assessed cervical spondylosis. Other: No mass or acute finding within the imaged orbits. Mild mucosal thickening within the left ethmoid and left maxillary sinuses. MRi brain impression #1 called by telephone at the time of interpretation on 09/07/2023 at 7:28 pm to provider [facility] Co[facility] , who verbally acknowledged these results. IMPRESSION: 1. Foci of restricted diffusion and T2 FLAIR hyperintense signal abnormality within the right cerebral peduncle, anterior left temporal lobe, callosal genu and right insula, as described and new from the prior brain MRI of 07/06/2023. These foci may reflect acute/early subacute infarcts. However, new sites of tumor cannot be excluded. A short-interval follow-up brain MRI is recommended. 2. Foci of restricted diffusion within the right retrolenticular/periatrial region and right  frontal lobe have slightly increased in extent since the prior MRI. Additionally, there is a new 3 mm right subinsular focus of parenchymal enhancement. These findings may reflect evolving treatment changes. However, a short-interval follow-up brain MRI is recommended for close surveillance. 3. Pre-contrast T1 hyperintense chronic blood products within the right frontal lobe have increased in extent. 4. Otherwise no significant interval change. 5. 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 Electronically Signed   By: Jackey Loge D.O.   On: 09/07/2023 20:07    ASSESSMENT & PLAN:  Post-ictal encephalopathy  LESTON SCHUELLER presents with clinical syndrome of focal and generalized decline in function.  Etiology is likely multifactorial; radiation necrosis, delayed radiation cytotoxicity, breakthrough seizures, multiple infarcts on recent MRI studies.    Most recent MRI demonstrates tiny infarct which is likely not clinically symptomatic.  He continues to improve his functional status with aggressive rehabilitation.    After prolonged discussion regarding the risks of foregoing certain medications, high risk for breakthrough seizures, he is agreeable to resume his home Lamictal 150/200 at this time.  Other medications he is agreeable to resume shortly.    All questions were answered. The patient knows to call the clinic with any problems, questions or concerns.  The total time spent in the encounter was 60 minutes and more than 50% was on counseling and review of test results     Henreitta Leber, MD 09/22/2023 4:08 PM

## 2023-09-23 ENCOUNTER — Encounter: Admitting: Speech Pathology

## 2023-09-23 DIAGNOSIS — I639 Cerebral infarction, unspecified: Secondary | ICD-10-CM | POA: Diagnosis not present

## 2023-09-23 LAB — GLUCOSE, CAPILLARY
Glucose-Capillary: 105 mg/dL — ABNORMAL HIGH (ref 70–99)
Glucose-Capillary: 111 mg/dL — ABNORMAL HIGH (ref 70–99)
Glucose-Capillary: 183 mg/dL — ABNORMAL HIGH (ref 70–99)
Glucose-Capillary: 133 mg/dL — ABNORMAL HIGH (ref 70–99)

## 2023-09-23 NOTE — Progress Notes (Signed)
 Speech Language Pathology Daily Session Note  Patient Details  Name: Alan Wise MRN: [id number] Date of Birth: 09/26/1970  Today's Date: 09/23/2023 SLP Individual Time: 1430-1530 SLP Individual Time Calculation (min): 60 min  Short Term Goals: Week 1: SLP Short Term Goal 1 (Week 1): Pt will utilize multimodal communication (as needed) to express simple wants/needs with maxA SLP Short Term Goal 2 (Week 1): Pt will complete functional naming tasks w/ maxA SLP Short Term Goal 3 (Week 1): Pt will comprehend mildly complex conversation w/ maxA SLP Short Term Goal 4 (Week 1): Pt will complete pharyngeal strengthening exercises w/ modA SLP Short Term Goal 5 (Week 1): Pt will utilize safe swallow strategies during PO intake with modA SLP Short Term Goal 6 (Week 1): Pt will compensate for L visual inattention during functional tasks with maxA  Skilled Therapeutic Interventions:   Pt greeted in his room; awake in his chair upon SLP arrival. He was overall cooperative with tx tasks targeting dysphagia and language. He ambulated to the ST office independently and then SLP facilitated generative naming tasks. He originally completed concrete generative naming task w/ 3 items and then more specific word finding task w/ category/letter. He required modA throughout structured and conversational naming tasks. SLP introduced CTAR and he completed 3 10 second holds w/ modA cues. Attempted masako x5, though comprehension deficits negatively impacted his ability to complete this. He seemingly demonstrated no awareness into dysphagia during conversation re safe swallow strategies, however, aphasia and empty speech likely made made this appear more severe. He was assisted back to his room and left in his bed with the alarm set and call light within reach. Recommend cont ST.   Pain  No pain reported.   Therapy/Group: Individual Therapy  [name] 09/23/2023, 3:37 PM

## 2023-09-23 NOTE — Patient Care Conference (Signed)
 Inpatient RehabilitationTeam Conference and Plan of Care Update Date: 09/22/2023   Time: 1154 am    Patient Name: Alan Wise      Medical Record Number: [id number]  Date of Birth: 1970/12/21 Sex: Male         Room/Bed: 4W22C/4W22C-01 Payor Info: Payor: [employer] / Plan: [facility] / Product Type: *No Product type* /    Admit Date/Time:  09/20/2023  1:51 PM  Primary Diagnosis:  CVA (cerebral vascular accident) [facility])  Hospital Problems: Principal Problem:   CVA (cerebral vascular accident) [facility]) Active Problems:   Post-ictal aphasia    Expected Discharge Date: Expected Discharge Date: 09/25/23  Team Members Present: Physician leading conference: Dr. Genice Rouge Social Worker Present: Dossie Der, LCSW Nurse Present: Konrad Dolores, RN PT Present: Truitt Leep, PT OT Present: Velia Meyer, OT SLP Present: Other (comment) Alvera Novel SLP) PPS Coordinator present : Fae Pippin, SLP     Current Status/Progress Goal Weekly Team Focus  Bowel/Bladder   pt is continent of b/b   Remain continent   Assist with toileting qshift and prn    Swallow/Nutrition/ Hydration   Dys 3 textures/thin liquids   minA for use of compensatory strategies  PO trials, pharyngeal exercises targeting PES opening, use of compensatory strategies, pt/family education    ADL's   SBA LB ADLs, SBA UB ADLs, SBA transfers, VC for initiation and sequencing   SBA   D/C planning, cognitive tasks    Mobility   supervision/CGA overall all mobility   supervision  left awareness with mobility    Communication   severe expressive and mod receptive deficits   modA given medical hx   pt/family education, functional naming tasks, compensatory strategy training    Safety/Cognition/ Behavioral Observations  difficult to assess given language deficits            Pain   No c/o pain   Remain pain free   Assess qshift and prn    Skin   Skin intact   Remain skin integrity  Assess  qshift and prn      Discharge Planning:  HOme with wife who is currently staying with pt, due to concerns regarding refusal of medications. Pt's parents involved but not always helpful to pt and his views    Team Discussion: Patient admitted post multifocal CVAs, complicated by oligodendroma with radiation necrosis. Patient limited by non compliance with medications, severe expressive and moderate  receptive deficits.  Patient on target to meet rehab goals: yes, Patient requires SBA with  LB ADLs, SBA for UB ADLs and SBA with transfers. Patient requires supervision/CGA with mobility. Overall goals are set for supervision at discharge.   *See Care Plan and progress notes for long and short-term goals.   Revisions to Treatment Plan:  Palliative care consult  Neuro-oncology consult Spiritual consult  Teaching Needs: Safety, medications, transfers, toileting, etc.   Current Barriers to Discharge: Decreased caregiver support and Behavior  Possible Resolutions to Barriers: Family education Outpatient follow -up     Medical Summary Current Status: pt refusing all meds- - delirium and confused- but still refusing all MEDS- conitnent of B/B- sleeping questionable. 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 Possible Resolutions to [employer]: working on strategies with food, cognition- d/C Friday- with needs no equipment-  Continued Need for Acute Rehabilitation Level of Care: The patient requires daily medical management by a physician with specialized training in physical medicine and rehabilitation for the following reasons: Direction of a multidisciplinary physical  rehabilitation program to maximize functional independence : Yes Medical management of patient stability for increased activity during participation in an intensive rehabilitation regime.: Yes Analysis of laboratory values and/or radiology reports with any subsequent need for medication adjustment and/or medical intervention. : Yes   I attest that I was present, lead the team conference, and concur with the assessment and plan of the team.   Konrad Dolores 09/22/2023, 1154 am

## 2023-09-23 NOTE — Progress Notes (Signed)
 Occupational Therapy Discharge Summary  Patient Details  Name: Alan Wise MRN: [id number] Date of Birth: 30-Jul-1970  Date of Discharge from OT service:September 24, 2023  {CHL IP REHAB OT TIME CALCULATIONS:304400400}   Patient has met {NUMBERS 0-12:18577} of {NUMBERS 0-12:18577} long term goals due to improved activity tolerance, improved balance, postural control, ability to compensate for deficits, functional use of  RIGHT upper, RIGHT lower, LEFT upper, and LEFT lower extremity, improved attention, improved awareness, and improved coordination.  Patient to discharge at overall Supervision level. Patient's care partner is independent to provide the necessary physical and cognitive assistance at discharge.    Reasons goals not met: ***  Recommendation:  Patient will benefit from ongoing skilled OT services in outpatient setting to continue to advance functional skills in the area of BADL and Reduce care partner burden.  Equipment: No equipment provided  Reasons for discharge: treatment goals met and discharge from hospital  Patient/family agrees with progress made and goals achieved: Yes  OT Discharge Precautions/Restrictions    General   Vital Signs Therapy Vitals Temp: 98.1 F (36.7 C) Temp Source: Oral Pulse Rate: (!) 102 Resp: 16 BP: (!) 130/90 Patient Position (if appropriate): Sitting Oxygen Therapy SpO2: 98 % O2 Device: Room Air Pain   ADL ADL Eating: Minimal assistance Where Assessed-Eating: Wheelchair Grooming: Supervision/safety Where Assessed-Grooming: Sitting at sink Upper Body Bathing: Supervision/safety Where Assessed-Upper Body Bathing: Sitting at sink Lower Body Bathing: Minimal assistance Where Assessed-Lower Body Bathing: Sitting at sink, Standing at sink Upper Body Dressing: Minimal assistance Where Assessed-Upper Body Dressing: Sitting at sink Lower Body Dressing: Contact guard Where Assessed-Lower Body Dressing: Sitting at sink,  Standing at sink Toileting: Minimal assistance Where Assessed-Toileting: Teacher, adult education: Curator Method: Proofreader: Raised toilet seat Tub/Shower Transfer: Unable to assess Tub/Shower Transfer Method: Unable to assess Film/video editor: Unable to assess [employer] Transfer Method: Unable to assess Vision   Perception    Praxis   Cognition   Sensation   Motor    Mobility     Trunk/Postural Assessment     Balance   Extremity/Trunk Assessment       Velia Meyer, OTD, OTR/L 09/23/2023, 4:36 PM

## 2023-09-23 NOTE — Plan of Care (Signed)
  Problem: Coping: Goal: Ability to adjust to condition or change in health will improve Outcome: Progressing   Problem: Fluid Volume: Goal: Ability to maintain a balanced intake and output will improve Outcome: Progressing   Problem: Health Behavior/Discharge Planning: Goal: Ability to identify and utilize available resources and services will improve Outcome: Progressing   Problem: Nutritional: Goal: Maintenance of adequate nutrition will improve Outcome: Progressing   Problem: Skin Integrity: Goal: Risk for impaired skin integrity will decrease Outcome: Progressing   Problem: RH BOWEL ELIMINATION Goal: RH STG MANAGE BOWEL WITH ASSISTANCE Description: STG Manage Bowel with Assistance. Outcome: Progressing   Problem: RH BLADDER ELIMINATION Goal: RH STG MANAGE BLADDER WITH ASSISTANCE Description: STG Manage Bladder With Assistance Outcome: Progressing   Problem: RH SKIN INTEGRITY Goal: RH STG SKIN FREE OF INFECTION/BREAKDOWN Outcome: Progressing   Problem: RH PAIN MANAGEMENT Goal: RH STG PAIN MANAGED AT OR BELOW PT'S PAIN GOAL Outcome: Progressing

## 2023-09-23 NOTE — IPOC Note (Signed)
 Overall Plan of Care [facility]) Patient Details Name: Alan Wise MRN: [id number] DOB: 01/24/1971  Admitting Diagnosis: CVA (cerebral vascular accident) [facility])  Hospital Problems: Principal Problem:   CVA (cerebral vascular accident) [facility]) Active Problems:   Post-ictal aphasia     Functional Problem List: Nursing Motor, Safety  PT Balance, Behavior, Motor, Perception, Safety  OT Balance, Cognition, Endurance, Motor, Safety, Sensory, Vision  SLP Behavior, Cognition, Safety, Sensory, Nutrition  TR         Basic ADL's: OT Eating, Grooming, Bathing, Dressing, Toileting     Advanced  ADL's: OT       Transfers: PT Bed to Chair, Car, [employer], [employer] fast streamer, Tub/Shower     Locomotion: PT Ambulation, Stairs     Additional Impairments: OT None  SLP Swallowing, Social Cognition, Communication comprehension, expression Problem Solving  TR      Anticipated Outcomes Item Anticipated Outcome  Self Feeding Supervision  Swallowing  minA   Basic self-care  Supervision  Toileting  Supervision   Bathroom Transfers Supervision  Bowel/Bladder  Patient will continue with regimen.  Transfers  Mod I/ supervision  Locomotion  supervision  Communication  modA  Cognition  modA  Pain  Patient will maintain pain of leass than 5/10  Safety/Judgment  Patient will remain free of falls for entire stay   Therapy Plan: PT Intensity: Minimum of 1-2 x/day ,45 to 90 minutes PT Frequency: 5 out of 7 days PT Duration Estimated Length of Stay: 10-12 days OT Intensity: Minimum of 1-2 x/day, 45 to 90 minutes OT Frequency: 5 out of 7 days OT Duration/Estimated Length of Stay: 10-12 days SLP Intensity: Minumum of 1-2 x/day, 30 to 90 minutes SLP Frequency: 3 to 5 out of 7 days SLP Duration/Estimated Length of Stay: 10-12 days   Team Interventions: Nursing Interventions Patient/Family Education, Cognitive Remediation/Compensation, Disease Management/Prevention  PT  interventions Ambulation/gait training, Community reintegration, DME/adaptive equipment instruction, Neuromuscular re-education, Psychosocial support, Stair training, UE/LE Strength taining/ROM, Warden/ranger, Discharge planning, Therapeutic Activities, UE/LE Coordination activities, Cognitive remediation/compensation, Disease management/prevention, Functional mobility training, Patient/family education, Therapeutic Exercise, Visual/perceptual remediation/compensation  OT Interventions Balance/vestibular training, Discharge planning, Pain management, Self Care/advanced ADL retraining, Therapeutic Activities, UE/LE Coordination activities, Cognitive remediation/compensation, Disease mangement/prevention, Functional mobility training, Patient/family education, Skin care/wound managment, Therapeutic Exercise, Visual/perceptual remediation/compensation, DME/adaptive equipment instruction, Neuromuscular re-education, Psychosocial support, UE/LE Strength taining/ROM  SLP Interventions Cognitive remediation/compensation, Internal/external aids, Environmental controls, Multimodal communication approach, Speech/Language facilitation, Therapeutic Exercise, Therapeutic Activities, Patient/family education, Functional tasks, Dysphagia/aspiration precaution training, Cueing hierarchy  TR Interventions    SW/CM Interventions Discharge Planning, Psychosocial Support, Patient/Family Education   Barriers to Discharge MD  Medical stability, Home enviroment access/loayout, Lack of/limited family support, and Behavior  Nursing Other (comments) Cognition  PT Decreased caregiver support, Home environment access/layout, Lack of/limited family support, Insurance for SNF coverage, Medication compliance, Behavior    OT Home environment access/layout, Medication compliance    SLP Other (comments) severity of deficits, medical dx  SW Insurance for SNF coverage, Medication compliance     Team Discharge  Planning: Destination: PT-Home ,OT- Home , SLP-Home Projected Follow-up: PT-Home health PT, Outpatient PT, 24 hour supervision/assistance, OT-  24 hour supervision/assistance, Outpatient OT, SLP-Outpatient SLP, 24 hour supervision/assistance Projected Equipment Needs: PT-To be determined, OT- To be determined, SLP-To be determined Equipment Details: PT- , OT-  Patient/family involved in discharge planning: PT- Patient, Family member/caregiver,  OT-Patient, Family member/caregiver, SLP-Patient, Family member/caregiver  MD ELOS: 5-7 days Medical Rehab Prognosis:  Good Assessment: The patient  has been admitted for CIR therapies with the diagnosis of multiple strokes in setting of brain tumor. The team will be addressing functional mobility, strength, stamina, balance, safety, adaptive techniques and equipment, self-care, bowel and bladder mgt, patient and caregiver education, . Goals have been set at mod A for cognition and SBA otherwise. Anticipated discharge destination is home with wife.        See Team Conference Notes for weekly updates to the plan of care

## 2023-09-23 NOTE — Progress Notes (Signed)
 Occupational Therapy Session Note  Patient Details  Name: Alan Wise MRN: [id number] Date of Birth: [birth year]  Today's Date: 09/23/2023 OT Individual Time: 4098-1191 OT Individual Time Calculation (min): 75 min    Short Term Goals: Week 1:  OT Short Term Goal 1 (Week 1): Pt will complete toilet transfer with CGA OT Short Term Goal 2 (Week 1): Pt will attend to L visual field with min questioning cues 75% of time OT Short Term Goal 3 (Week 1): Pt will complete 3/3 toileting steps with CGA  Skilled Therapeutic Interventions/Progress Updates:      Therapy Documentation Precautions:  Precautions Precautions: Fall Precaution/Restrictions Comments: seizures, prior CVA, expressive aphasia, monitor vitals,  L field cut Restrictions Weight Bearing Restrictions Per Provider Order: No General: "I am excited" Pt seated in W/C upon OT arrival, agreeable to OT.  Pain: no pain reported  Balance: Pt completed a variety of standing activities in order to promote increased balance strategies with ADL participation. Pt completed all activities at standing level with higher level balance activities with intermittent unsupported standing at RW. 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 Pt attempting to complete the following activities with VC required for directions: -completing 10 item grocery list, VC required for spelling and planning -simple addition/subtraction and equations on computer, pt with increased difficulty with simple math vs equations, required Max VC for direction of task -picture matching task, no VC required   Pt seated in W/C at end of session with W/C alarm donned, call light within reach  and 4Ps assessed.    Therapy/Group: Individual Therapy  Velia Meyer, OTD, OTR/L 09/23/2023, 12:52 PM

## 2023-09-23 NOTE — Progress Notes (Signed)
 Physical Therapy Session Note  Patient Details  Name: Alan Wise MRN: [id number] Date of Birth: 1970-12-18  Today's Date: 09/23/2023 PT Individual Time: 1005-1105 PT Individual Time Calculation (min): 60 min   Short Term Goals: Week 1:  PT Short Term Goal 1 (Week 1): Pt will perform all standing transfers with improved initial balance and overall supervision. PT Short Term Goal 2 (Week 1): pt will ambulate with no AD and minimal path deviation throughout community distances with overall supervision/ CGA. PT Short Term Goal 3 (Week 1): Pt will complete additional, appropriate outcome measure. PT Short Term Goal 4 (Week 1): Pt will complete floor transfer with overall CGA.  Skilled Therapeutic Interventions/Progress Updates:      Therapy Documentation Precautions:  Precautions Precautions: Fall Precaution/Restrictions Comments: seizures, prior CVA, expressive aphasia, monitor vitals,  L field cut Restrictions Weight Bearing Restrictions Per Provider Order: No  Pt agreeable to PT session with emphasis on UE/LE coordination and NMR to address balance deficits. Pt denies pain and supervision gait throughout unit in session with self-paced standing and seated rest breaks. Pt with L UE weakness deficits therefore pt participated in activities to facilitate hand and lower body coordination:   -gait with dribbling progressed from bounce with two hands vs pass from each hand (CGA/min A)   - ballroom dancing on compliant and noncompliant surfaces with and without 5#tidal tank (CGA/min A for balance)  Pt returned to room and left seated at bedside with all needs in reach, alarm on.     Therapy/Group: Individual Therapy  Truitt Leep Truitt Leep PT, DPT  09/23/2023, 3:54 PM

## 2023-09-23 NOTE — Progress Notes (Signed)
 PROGRESS NOTE   Subjective/Complaints:  Pt excited gets to go home Friday Plans on taking more meds tomorrow- and adding every few days- took lamictal yesterday afternoon and evening and plans on taking this AM.  Excited about therapy which is due soon.   No BM in 4-5 days- we discussed diet changes ot help including fruit and juice- avoid banana and apples since can constipate pts.   ROS: Limited by aphasia/cognition  Objective:   No results found. Recent Labs    09/21/23 0540  WBC 9.9  HGB 15.7  HCT 44.7  PLT 205   Recent Labs    09/21/23 0540  NA 140  K 4.3  CL 101  CO2 25  GLUCOSE 102*  BUN 13  CREATININE 1.03  CALCIUM 9.6    Intake/Output Summary (Last 24 hours) at 09/23/2023 0829 Last data filed at 09/23/2023 0736 Gross per 24 hour  Intake 708 ml  Output --  Net 708 ml         Physical Exam: Vital Signs Blood pressure 138/87, pulse 67, temperature 98.2 F (36.8 C), temperature source Oral, resp. rate 17, height 5\' 8"  (1.727 m), weight 64.5 kg, SpO2 99%.      General: awake, alert, appropriate, wife at bedside; NAD HENT: conjugate gaze; oropharynx moist CV: regular rate and rhythm; no JVD Pulmonary: CTA B/L; no W/R/R- good air movement GI: soft, NT, ND, (+)BS Psychiatric: more appropriate- more interactive- calm Neurological: still aphasic- but more speech noted Assessment/Plan: 1. Functional deficits which require 3+ hours per day of interdisciplinary therapy in a comprehensive inpatient rehab setting. Physiatrist is providing close team supervision and 24 hour management of active medical problems listed below. Physiatrist and rehab team continue to assess barriers to discharge/monitor patient progress toward functional and medical goals  Care Tool:  Bathing    Body parts bathed by patient: Right arm, Left arm, Chest, Front perineal area, Abdomen, Right upper leg, Left upper leg,  Right lower leg, Left lower leg, Face   Body parts bathed by helper: Buttocks     Bathing assist Assist Level: Minimal Assistance - Patient > 75%     Upper Body Dressing/Undressing Upper body dressing   What is the patient wearing?: Pull over shirt    Upper body assist Assist Level: Minimal Assistance - Patient > 75%    Lower Body Dressing/Undressing Lower body dressing      What is the patient wearing?: Pants     Lower body assist Assist for lower body dressing: Contact Guard/Touching assist     Toileting Toileting    Toileting assist Assist for toileting: Minimal Assistance - Patient > 75%     Transfers Chair/bed transfer  Transfers assist     Chair/bed transfer assist level: Minimal Assistance - Patient > 75%     Locomotion Ambulation   Ambulation assist      Assist level: Minimal Assistance - Patient > 75% Assistive device: No Device Max distance: 300 ft   Walk 10 feet activity   Assist     Assist level: Contact Guard/Touching assist Assistive device: No Device   Walk 50 feet activity   Assist    Assist level:  Minimal Assistance - Patient > 75% Assistive device: No Device    Walk 150 feet activity   Assist    Assist level: Minimal Assistance - Patient > 75% Assistive device: No Device, Walker-rolling    Walk 10 feet on uneven surface  activity   Assist Walk 10 feet on uneven surfaces activity did not occur: Safety/medical concerns         Wheelchair     Assist Is the patient using a wheelchair?: Yes (only using w/c for transport - not expected to require on d/c) Type of Wheelchair: Manual    Wheelchair assist level: Dependent - Patient 0% Max wheelchair distance: 300 ft    Wheelchair 50 feet with 2 turns activity    Assist        Assist Level: Dependent - Patient 0%   Wheelchair 150 feet activity     Assist      Assist Level: Dependent - Patient 0%   Blood pressure 138/87, pulse 67,  temperature 98.2 F (36.8 C), temperature source Oral, resp. rate 17, height 5\' 8"  (1.727 m), weight 64.5 kg, SpO2 99%.  Medical Problem List and Plan: 1. Functional deficits secondary to multifocal CVAs, complicated by oligodendroma with radiation necrosis             -patient may  shower             -ELOS/Goals: 10 to 12 days, supervision PT/OT/SLP             D/c this Friday 4/4  Con't CIR PT, OT and SLP 2.  Antithrombotics: -DVT/anticoagulation:  Pharmaceutical: Eliquis 5 mg twice daily             -antiplatelet therapy: n/a   3. Pain Management: As needed Tylenol   4. Mood/Behavior/Sleep: No current medications             -antipsychotic agents: n/a             -Sleep log added   3/31- refusing all meds 5. Neuropsych/cognition: This patient is not capable of making decisions on his own behalf.             -Reporting ongoing confusion/delirium.  Delirium precautions placed.               3/31- pt refusing ALL meds, no matter what- getting more agitated- might need IM meds?  4/2- pt starting to take seizure meds-  6. Skin/Wound Care: Standard precautions and orders.   7. Fluids/Electrolytes/Nutrition: CMET in a.m.  On regular diet.             -On nutritional supplements twice daily with meals             -B12 supplementation 8. Seizure disorder -Seizure precautions added -Last known seizure 3-26 -Lamictal 150 mg every morning, 200 mg every afternoon -As needed IV Ativan 1 to 2 mg for seizures added   3.31- at risk for more seizures and concern for needing to go back to acute- pt just got agitated when discussed- wife agreeable to treatment, but pt threatened to pull any tube.   4/2- pt now taking Lamictal and plans to add back another medicine tomorrow-  9. Dysphagia -On regular diet and intake; pending SLP assessment   4/2- eating well on D3 diet 10. type 2 diabetes with hyperglycemia.  SSI 3 times daily AC.   3/31- A1c 5.4- down from 6.3  -will stop CBG's once he's doing  better-   4/2- will stop  CBGs since A1c 5.4 11. Hypertension.  Continue amlodipine 10 mg daily, losartan 25 mg daily, propranolol 20 mg twice daily.   3/31- pt refusing all meds- at risk for new CVA and possible death- will call Palliative care.  12. Noncompliance-refusing all meds for "3 days".    3/31- sounds like pt perseverating on "overdose" from meds- an idea he got from his mother-will call Palliative care and see what might be able ot be done? Also pt's wife reached out to Dr Barbaraann Cao as well.   Will reach out to Neuro as well to see if they have any ideas?  4/1- reached out to Dr Barbaraann Cao- who is coming today to see pt; Neuro- and palliative care- pt refusing all PO meds except baby ASA possibly, and all IV meds- wife doesn't want pt sedated to give meds- He's now stated per chart and d/w pt (wife not in room this AM)- that he will start meds- 1 med at a time on day 3- and do more every "few days"- continues to perseverate that he's overdosing on meds and needs a break from them- spoke with Nursing at length about this- wife aware that if she seizes, will have to leave CIR and is at increased risk of stroke and possibly death with current lack of medicine. Calling wife to discuss again today- in spite of all discussions,still  refusing all meds. Does recognize today is ending day 2-  4/2- pt took Lamictal yesterday afternoon as well night time Lamictal- and plans to take this AM!  - pt doesn't want to take bowel meds- but we discussed ways to treat constipation 13. Constipation  4/2- LBM 4-5 days ago- pt not willing to take bowel meds, but will take fruit and fruit juices- encouraged pt and wife to do so except no banana and no apple- they cause constipation.    I spent a total of 36   minutes on total care today- >50% coordination of care- due to  D/w pt and wife about seizure meds- also bowels and IPOC-      LOS: 3 days A FACE TO FACE EVALUATION WAS PERFORMED  Alan Wise 09/23/2023,  8:29 AM

## 2023-09-23 NOTE — Progress Notes (Signed)
 Daily Progress Note   Patient Name: Alan Wise       Date: 09/23/2023 DOB: 01-Oct-1970  Age: 53 y.o. MRN#: [id number] Attending Physician: Genice Rouge, MD Primary Care Physician: Lorre Munroe, NP Admit Date: 09/20/2023  Reason for Consultation/Follow-up: Establishing goals of care  Subjective: Medical records reviewed including progress notes, labs, imaging. Patient assessed at the bedside.  He is sitting in wheelchair, reports feeling okay today.  His wife is present visiting.  Created space and opportunity for patient and family's thoughts and feelings on his current illness.  He reflects on his "limited mind" and the process of finishing up in [employer] then quitting his job.  He states that his functioning is getting bad.  I explored his thoughts and he states it feels "yes and no."  He finds great joy in working with therapy.  We discussed the importance of planning for all possible outcomes and continuing goals of care discussions based on patient's disease burden, quality of life, and preferences as his perspective on the aforementioned changes with time.  We discussed that all people reach their limit at different points along the disease trajectory.  Encouraged open and honest conversations about patient's wishes, including desire to stop life-prolonging medications/treatment at any point.  Patient verbalizes understanding, stating that at this point he wants to "stay alive even if he is dead."  His wife feels like things are getting better and she is appreciative of the support.  Outpatient palliative care was explained and offered.  Patient is "not sure" at this time and neither is his wife.  They will continue deliberating and reach out to social worker if interested in referral.  We also discussed that PMT has a palliative clinic in  the outpatient cancer center, should they desire a referral later on from Dr. Barbaraann Cao.  Questions and concerns addressed. PMT will continue to support holistically.   Length of Stay: 3   Physical Exam Vitals and nursing note reviewed.  Constitutional:      General: He is not in acute distress. Cardiovascular:     Rate and Rhythm: Normal rate.  Pulmonary:     Effort: Pulmonary effort is normal.  Neurological:     Mental Status: He is alert.  Psychiatric:        Mood and Affect: Mood normal.        Behavior: Behavior normal.            Vital Signs: BP 138/87 (BP Location: Left Arm)   Pulse 67   Temp 98.2 F (36.8 C) (Oral)   Resp 17   Ht 5\' 8"  (1.727 m)   Wt 64.5 kg   SpO2 99%   BMI 21.62 kg/m  SpO2: SpO2: 99 % O2 Device: O2 Device: Room Air O2 Flow Rate:        Palliative Care Assessment & Plan   Patient Profile: 53 y.o. 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    Workup in the ED including MRI brain  showed new punctate focus of restricted diffusion in the anterior insular cortex compatible with a new acute/subacute infarct, stable 8 mm acute/subacute infarct involving the right cerebral peduncle.    Patient was hospitalized for management of seizures and CVA. He was also hospitalized before this from 3-19 to 3-21 with new CVA and breakthrough seizures.   PMT has been consulted to assist with goals of care conversation in the setting of medication noncompliance.  Assessment: Goals of care conversation Seizure disorder Multifocal CVAs Oligodendroma Radiation necrosis Delirium  Recommendations/Plan: Continue full code/full scope treatment Goals of care remain clear and consistent to prolong life as long as possible, continue working  with therapy for as much improvement of functioning as possible Patient and wife will continue deliberating on whether outpatient palliative care is desired Psychosocial and emotional support provided PMT remains available as needed   Prognosis:  Unable to determine  Discharge Planning: Home  Care plan was discussed with patient, patient's wife   Total time: I spent 35 minutes in the care of the patient today in the above activities and documenting the encounter.          Harrie Cazarez Jeni Salles, PA-C  Palliative Medicine Team Team phone # [phone]  Thank you for allowing the Palliative Medicine Team to assist in the care of this patient. Please utilize secure chat with additional questions, if there is no response within 30 minutes please call the above phone number.  Palliative Medicine Team providers are available by phone from 7am to 7pm daily and can be reached through the team cell phone.  Should this patient require assistance outside of these hours, please call the patient's attending physician.

## 2023-09-24 ENCOUNTER — Encounter: Payer: Self-pay | Admitting: Internal Medicine

## 2023-09-24 ENCOUNTER — Other Ambulatory Visit (HOSPITAL_COMMUNITY): Payer: Self-pay

## 2023-09-24 DIAGNOSIS — I639 Cerebral infarction, unspecified: Secondary | ICD-10-CM | POA: Diagnosis not present

## 2023-09-24 LAB — GLUCOSE, CAPILLARY
Glucose-Capillary: 121 mg/dL — ABNORMAL HIGH (ref 70–99)
Glucose-Capillary: 118 mg/dL — ABNORMAL HIGH (ref 70–99)
Glucose-Capillary: 142 mg/dL — ABNORMAL HIGH (ref 70–99)

## 2023-09-24 MED ORDER — ATORVASTATIN CALCIUM 40 MG PO TABS
40.0000 mg | ORAL_TABLET | Freq: Every day | ORAL | 0 refills | Status: DC
  Filled 2023-09-24: qty 30, 30d supply, fill #0

## 2023-09-24 MED ORDER — PROPRANOLOL HCL 20 MG PO TABS
20.0000 mg | ORAL_TABLET | Freq: Two times a day (BID) | ORAL | 0 refills | Status: DC
  Filled 2023-09-24: qty 60, 30d supply, fill #0

## 2023-09-24 MED ORDER — AMLODIPINE BESYLATE 5 MG PO TABS
5.0000 mg | ORAL_TABLET | Freq: Every day | ORAL | Status: DC
  Administered 2023-09-25: 5 mg via ORAL
  Filled 2023-09-24: qty 1

## 2023-09-24 MED ORDER — DIVALPROEX SODIUM 500 MG PO DR TAB
500.0000 mg | DELAYED_RELEASE_TABLET | Freq: Three times a day (TID) | ORAL | 0 refills | Status: DC
  Filled 2023-09-24: qty 90, 30d supply, fill #0

## 2023-09-24 MED ORDER — LAMOTRIGINE 25 MG PO TABS
ORAL_TABLET | ORAL | 0 refills | Status: DC
Start: 2023-09-24 — End: 2023-11-17
  Filled 2023-09-24: qty 420, 30d supply, fill #0

## 2023-09-24 MED ORDER — VITAMIN D 25 MCG (1000 UNIT) PO TABS
1000.0000 [IU] | ORAL_TABLET | Freq: Every day | ORAL | 0 refills | Status: DC
  Filled 2023-09-24: qty 30, 30d supply, fill #0

## 2023-09-24 MED ORDER — APIXABAN 5 MG PO TABS
5.0000 mg | ORAL_TABLET | Freq: Two times a day (BID) | ORAL | 0 refills | Status: DC
  Filled 2023-09-24: qty 60, 30d supply, fill #0

## 2023-09-24 MED ORDER — AMLODIPINE BESYLATE 10 MG PO TABS
10.0000 mg | ORAL_TABLET | Freq: Every day | ORAL | 0 refills | Status: DC
  Filled 2023-09-24: qty 30, 30d supply, fill #0

## 2023-09-24 MED ORDER — LOSARTAN POTASSIUM 25 MG PO TABS
25.0000 mg | ORAL_TABLET | Freq: Every day | ORAL | 0 refills | Status: DC
Start: 2023-09-24 — End: 2023-12-10
  Filled 2023-09-24: qty 30, 30d supply, fill #0

## 2023-09-24 MED ORDER — CYANOCOBALAMIN 1000 MCG PO TABS
1000.0000 ug | ORAL_TABLET | Freq: Every day | ORAL | 0 refills | Status: DC
  Filled 2023-09-24: qty 30, 30d supply, fill #0

## 2023-09-24 NOTE — Plan of Care (Signed)
 Problem: RH Balance Goal: LTG Patient will maintain dynamic standing with ADLs (OT) Description: LTG:  Patient will maintain dynamic standing balance with assist during activities of daily living (OT)  Outcome: Completed/Met Flowsheets (Taken 09/21/2023 1304 by Cheyenne Adas, Hope E, OT) LTG: Pt will maintain dynamic standing balance during ADLs with: Supervision/Verbal cueing   Problem: Sit to Stand Goal: LTG:  Patient will perform sit to stand in prep for activites of daily living with assistance level (OT) Description: LTG:  Patient will perform sit to stand in prep for activites of daily living with assistance level (OT) Outcome: Completed/Met Flowsheets (Taken 09/21/2023 1304 by Candee Furbish E, OT) LTG: PT will perform sit to stand in prep for activites of daily living with assistance level: Supervision/Verbal cueing   Problem: RH Eating Goal: LTG Patient will perform eating w/assist, cues/equip (OT) Description: LTG: Patient will perform eating with assist, with/without cues using equipment (OT) Outcome: Completed/Met Flowsheets (Taken 09/21/2023 1304 by Cheyenne Adas, Hope E, OT) LTG: Pt will perform eating with assistance level of: Set up assist    Problem: RH Grooming Goal: LTG Patient will perform grooming w/assist,cues/equip (OT) Description: LTG: Patient will perform grooming with assist, with/without cues using equipment (OT) Outcome: Completed/Met Flowsheets (Taken 09/21/2023 1304 by Cheyenne Adas, Hope E, OT) LTG: Pt will perform grooming with assistance level of: Set up assist    Problem: RH Bathing Goal: LTG Patient will bathe all body parts with assist levels (OT) Description: LTG: Patient will bathe all body parts with assist levels (OT) Outcome: Completed/Met Flowsheets (Taken 09/21/2023 1304 by Cheyenne Adas, Hope E, OT) LTG: Pt will perform bathing with assistance level/cueing: Supervision/Verbal cueing   Problem: RH Dressing Goal: LTG Patient will perform upper body dressing  (OT) Description: LTG Patient will perform upper body dressing with assist, with/without cues (OT). Outcome: Completed/Met Flowsheets (Taken 09/21/2023 1304 by Candee Furbish E, OT) LTG: Pt will perform upper body dressing with assistance level of: Set up assist Goal: LTG Patient will perform lower body dressing w/assist (OT) Description: LTG: Patient will perform lower body dressing with assist, with/without cues in positioning using equipment (OT) Outcome: Completed/Met Flowsheets (Taken 09/21/2023 1304 by Candee Furbish E, OT) LTG: Pt will perform lower body dressing with assistance level of: Supervision/Verbal cueing   Problem: RH Toileting Goal: LTG Patient will perform toileting task (3/3 steps) with assistance level (OT) Description: LTG: Patient will perform toileting task (3/3 steps) with assistance level (OT)  Outcome: Completed/Met Flowsheets (Taken 09/21/2023 1304 by Cheyenne Adas, Hope E, OT) LTG: Pt will perform toileting task (3/3 steps) with assistance level: Supervision/Verbal cueing   Problem: RH Toilet Transfers Goal: LTG Patient will perform toilet transfers w/assist (OT) Description: LTG: Patient will perform toilet transfers with assist, with/without cues using equipment (OT) Outcome: Completed/Met Flowsheets (Taken 09/21/2023 1304 by Cheyenne Adas, Hope E, OT) LTG: Pt will perform toilet transfers with assistance level of: Supervision/Verbal cueing   Problem: RH Tub/Shower Transfers Goal: LTG Patient will perform tub/shower transfers w/assist (OT) Description: LTG: Patient will perform tub/shower transfers with assist, with/without cues using equipment (OT) Outcome: Completed/Met Flowsheets (Taken 09/21/2023 1304 by Cheyenne Adas, Hope E, OT) LTG: Pt will perform tub/shower stall transfers with assistance level of: Supervision/Verbal cueing   Problem: RH Awareness Goal: LTG: Patient will demonstrate awareness during functional activites type of (OT) Description: LTG: Patient will  demonstrate awareness during functional activites type of (OT) Outcome: Completed/Met Flowsheets (Taken 09/21/2023 1304 by Melvyn Novas, OT) Patient will demonstrate awareness during functional activites type of: Emergent LTG: Patient will  demonstrate awareness during functional activites type of (OT): Supervision   Problem: RH Furniture Transfers Goal: LTG Patient will perform furniture transfers w/assist (OT/PT) Description: LTG: Patient will perform furniture transfers  with assistance (OT/PT). Outcome: Completed/Met Flowsheets (Taken 09/21/2023 1805 by Loel Dubonnet, PT) LTG: Pt will perform furniture transfers with assist:: Independent

## 2023-09-24 NOTE — Plan of Care (Signed)
  Problem: RH Comprehension Communication Goal: LTG Patient will comprehend basic/complex auditory (SLP) Description: LTG: Patient will comprehend basic/complex auditory information with cues (SLP). Outcome: Completed/Met   Problem: RH Expression Communication Goal: LTG Patient will express needs/wants via multi-modal(SLP) Description: LTG:  Patient will express needs/wants via multi-modal communication (gestures/written, etc) with cues (SLP) Outcome: Not Met (add Reason) Goal: LTG Patient will increase word finding of common (SLP) Description: LTG:  Patient will increase word finding of common objects/daily info/abstract thoughts with cues using compensatory strategies (SLP). Outcome: Not Met (add Reason)   Problem: RH Pre-functional/Other (Specify) Goal: RH LTG SLP (Specify) 1 Description: RH LTG SLP (Specify) 1 Outcome: Not Met (add Reason)   Problem: RH Swallowing Goal: LTG Patient will consume least restrictive diet using compensatory strategies with assistance (SLP) Description: LTG:  Patient will consume least restrictive diet using compensatory strategies with assistance (SLP) Outcome: Adequate for Discharge   Problem: RH Swallowing Goal: LTG Patient will participate in dysphagia therapy to increase swallow function with assistance (SLP) Description: LTG:  Patient will participate in dysphagia therapy to increase swallow function with assistance (SLP) Outcome: Completed/Met   Problem: RH Comprehension Communication Goal: LTG Patient will comprehend basic/complex auditory (SLP) Description: LTG: Patient will comprehend basic/complex auditory information with cues (SLP). Outcome: Completed/Met

## 2023-09-24 NOTE — Progress Notes (Signed)
 Speech Language Pathology Discharge Summary  Patient Details  Name: Alan Wise MRN: [id number] Date of Birth: 1970-12-27  Date of Discharge from SLP service:September 24, 2023  Today's Date: 09/24/2023 SLP Individual Time: 1115-1212 SLP Individual Time Calculation (min): 57 min   Skilled Therapeutic Interventions:   Pt and wife greeted at bedside. He ambulated to the ST office, only requiring s verbal cues for path finding and to avoid obstacles on the L. SLP provided pt w/ a variety of language tasks to complete at home. SLP demonstrated each task and then had the pt complete 2-3 trials. He benefited from mod-maxA for word finding and modA to compensate for L homonymous hemianopsia. SLP also reviewed CTAR via ball and pt completed 3 10 second isometric holds and 10 isokinetic reps w/ maxA verbal/visual cues to maintain adequate technique. At the end of tx tasks, SLP assisted pt back to his room where he was left in his chair with his call light within reach. Spoke with pt's wife outside the room; providing additional education re aphasia, dysphagia, frustration tolerance, and ST goals. She verbalized understanding of all education.     Patient has met 2 of 6 long term goals.  Patient to discharge at overall Max level.  Reasons goals not met: short length of stay   Clinical Impression/Discharge Summary:  Limited progress noted this admission given severity of deficits and short length of stay (4 days). Severe expressive and moderate receptive aphasia remain at this time. Pt/family education complete and he was provided w/ language tasks and dysphagia exercises to complete at home. Pharyngoesophageal dysphagia continues but he is discharging home on a dys 3 diet and thin liquids. Recommend OP ST to further target cognitive-linguistic deficits and dysphagia to maximize pt independence and reduce caregiver burden.    Care Partner:  Caregiver Able to Provide Assistance: Yes  Type of Caregiver  Assistance: Cognitive  Recommendation:  Outpatient SLP;24 hour supervision/assistance  Rationale for SLP Follow Up: Maximize functional communication;Reduce caregiver burden   Equipment: n/a   Reasons for discharge: Discharged from hospital   Patient/Family Agrees with Progress Made and Goals Achieved: Yes    [name] 09/24/2023, 12:37 PM

## 2023-09-24 NOTE — Progress Notes (Signed)
 Patient refused Eliquis, cholesterol and vitamins meds but agreed to take blood pressure, Lamictal, and Aspirin meds. Medical team is aware about patient meds refusal per wife.

## 2023-09-24 NOTE — Progress Notes (Addendum)
 PROGRESS NOTE   Subjective/Complaints:  Pt reports had BM this AM- hasn't been documented?  Pt feels today is "day 3"- with meds and willing to go back taking BP meds- explained ot wife that his BP hasn't been too bad, and might actually benefit more form taking Eliquis blood thinner.   Spoke with wife about d/c plans- f/u with Neuro- and me- d/c 9-11 am tomorrow- no therapy tomorrow   ROS: Limited by aphasia/cognition  Objective:   No results found. No results for input(s): "WBC", "HGB", "HCT", "PLT" in the last 72 hours.  No results for input(s): "NA", "K", "CL", "CO2", "GLUCOSE", "BUN", "CREATININE", "CALCIUM" in the last 72 hours.   Intake/Output Summary (Last 24 hours) at 09/24/2023 0829 Last data filed at 09/24/2023 0824 Gross per 24 hour  Intake 596 ml  Output --  Net 596 ml         Physical Exam: Vital Signs Blood pressure 126/76, pulse 81, temperature 98 F (36.7 C), resp. rate 17, height 5\' 8"  (1.727 m), weight 64.5 kg, SpO2 97%.       General: awake, alert, appropriate, sitting on mat in gym with PT; NAD HENT: conjugate gaze; oropharynx moist CV: regular rate and rhythm- rate in 90's; no JVD Pulmonary: slightly coarse, better with a forced cough GI: soft, NT, ND, (+)BS- more normoactive Psychiatric: appropriate- calm Neurological: aphasia still evident  Assessment/Plan: 1. Functional deficits which require 3+ hours per day of interdisciplinary therapy in a comprehensive inpatient rehab setting. Physiatrist is providing close team supervision and 24 hour management of active medical problems listed below. Physiatrist and rehab team continue to assess barriers to discharge/monitor patient progress toward functional and medical goals  Care Tool:  Bathing    Body parts bathed by patient: Right arm, Left arm, Chest, Front perineal area, Abdomen, Right upper leg, Left upper leg, Right lower leg, Left  lower leg, Face   Body parts bathed by helper: Buttocks     Bathing assist Assist Level: Minimal Assistance - Patient > 75%     Upper Body Dressing/Undressing Upper body dressing   What is the patient wearing?: Pull over shirt    Upper body assist Assist Level: Minimal Assistance - Patient > 75%    Lower Body Dressing/Undressing Lower body dressing      What is the patient wearing?: Pants     Lower body assist Assist for lower body dressing: Contact Guard/Touching assist     Toileting Toileting    Toileting assist Assist for toileting: Minimal Assistance - Patient > 75%     Transfers Chair/bed transfer  Transfers assist     Chair/bed transfer assist level: Minimal Assistance - Patient > 75%     Locomotion Ambulation   Ambulation assist      Assist level: Minimal Assistance - Patient > 75% Assistive device: No Device Max distance: 300 ft   Walk 10 feet activity   Assist     Assist level: Contact Guard/Touching assist Assistive device: No Device   Walk 50 feet activity   Assist    Assist level: Minimal Assistance - Patient > 75% Assistive device: No Device    Walk 150 feet activity  Assist    Assist level: Minimal Assistance - Patient > 75% Assistive device: No Device, Walker-rolling    Walk 10 feet on uneven surface  activity   Assist Walk 10 feet on uneven surfaces activity did not occur: Safety/medical concerns         Wheelchair     Assist Is the patient using a wheelchair?: Yes (only using w/c for transport - not expected to require on d/c) Type of Wheelchair: Manual    Wheelchair assist level: Dependent - Patient 0% Max wheelchair distance: 300 ft    Wheelchair 50 feet with 2 turns activity    Assist        Assist Level: Dependent - Patient 0%   Wheelchair 150 feet activity     Assist      Assist Level: Dependent - Patient 0%   Blood pressure 126/76, pulse 81, temperature 98 F (36.7 C),  resp. rate 17, height 5\' 8"  (1.727 m), weight 64.5 kg, SpO2 97%.  Medical Problem List and Plan: 1. 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  Antithrombotics: -DVT/anticoagulation:  Pharmaceutical: Eliquis 5 mg twice daily 4/3- advised wife that would really like pt to take Eliquis.              -antiplatelet therapy: n/a   3. Pain Management: As needed Tylenol   4. Mood/Behavior/Sleep: No current medications             -antipsychotic agents: n/a             -Sleep log added    5. Neuropsych/cognition: This patient is not capable of making decisions on his own behalf.             -Reporting ongoing confusion/delirium.  Delirium precautions placed.               3/31- pt refusing ALL meds, no matter what- getting more agitated- might need IM meds?  4/2- pt starting to take seizure meds- 4/3- pt willing ot take 'something" today- hopeful will take Eliquis  6. Skin/Wound Care: Standard precautions and orders.   7. Fluids/Electrolytes/Nutrition: CMET in a.m.  On regular diet.             -On nutritional supplements twice daily with meals             -B12 supplementation 8. Seizure disorder -Seizure precautions added -Last known seizure 3-26 -Lamictal 150 mg every morning, 200 mg every afternoon -As needed IV Ativan 1 to 2 mg for seizures added   3.31- at risk for more seizures and concern for needing to go back to acute- pt just got agitated when discussed- wife agreeable to treatment, but pt threatened to pull any tube.   4/2- pt now taking Lamictal and plans to add back another medicine tomorrow-  9. Dysphagia -On regular diet and intake; pending SLP assessment   4/2- eating well on D3  diet 10. type 2 diabetes with hyperglycemia.  SSI 3 times daily AC.   3/31- A1c 5.4-  down from 6.3  -will stop CBG's once he's doing better-   4/2- will stop CBGs since A1c 5.4 11. Hypertension.  Continue amlodipine 10 mg daily, losartan 25 mg daily, propranolol 20 mg twice daily.   3/31- pt refusing all meds- at risk for new CVA and possible death- will call Palliative care. 4/3- Pt's BP is running 120s-140s without BP meds- will reduce Norvasc to 5 mg daily since BP is doing pretty well without meds- heart rat ien 90's - so would benefit from Propranolol, but don't want to drop his BP too low.   12. Noncompliance-refusing all meds for "3 days".    3/31- sounds like pt perseverating on "overdose" from meds- an idea he got from his mother-will call Palliative care and see what might be able ot be done? Also pt's wife reached out to Dr Barbaraann Cao as well.   Will reach out to Neuro as well to see if they have any ideas?  4/1- reached out to Dr Barbaraann Cao- who is coming today to see pt; Neuro- and palliative care- pt refusing all PO meds except baby ASA possibly, and all IV meds- wife doesn't want pt sedated to give meds- He's now stated per chart and d/w pt (wife not in room this AM)- that he will start meds- 1 med at a time on day 3- and do more every "few days"- continues to perseverate that he's overdosing on meds and needs a break from them- spoke with Nursing at length about this- wife aware that if she seizes, will have to leave CIR and is at increased risk of stroke and possibly death with current lack of medicine. Calling wife to discuss again today- in spite of all discussions,still  refusing all meds. Does recognize today is ending day 2-  4/2- pt took Lamictal yesterday afternoon as well night time Lamictal- and plans to take this AM!  - pt doesn't want to take bowel meds- but we discussed ways to treat constipation 13. Constipation  4/2- LBM 4-5 days ago- pt not willing to take bowel meds, but will  take fruit and fruit juices- encouraged pt and wife to do so except no banana and no apple- they cause constipation.   4/3- pt emphatic had BM this AM- don't see documented. But abd exam more c/w having BM   I spent a total of  41  minutes on total care today- >50% coordination of care- due to  D/w pt in gym as well as wife in room about d/c plans- f/u and timing of d/c- as wlel as meds  Addendum- of note, pt not on Depakote at home, just lamictal for seizures- will stop PO Depakote, since was to take IV until would take PO meds- which did not occur- wife emphatic was never on Depakote.     LOS: 4 days A FACE TO FACE EVALUATION WAS PERFORMED  Alan Wise 09/24/2023, 8:29 AM

## 2023-09-24 NOTE — Progress Notes (Signed)
 Physical Therapy Discharge Summary  Patient Details  Name: Alan Wise MRN: [id number] Date of Birth: 1970/09/01  Date of Discharge from PT service:September 24, 2023  Today's Date: 09/24/2023 PT Individual Time: 1914-7829, [id number]  PT Individual Time Calculation (min): 73 min , 45 min    Patient has met 2 of 9 long term goals due to ability to compensate for deficits and improved coordination.  Patient is largely supervision with all mobility due to cognitive deficits and safety.   Reasons goals not met: Pt with cognitive deficits and therefore recommend supervision with all mobility excluding supine<>sit and rolling for safety.   Recommendation:  Patient will benefit from ongoing skilled PT services in outpatient setting to continue to advance safe functional mobility, address ongoing impairments in balance, coordination, and minimize fall risk.  Equipment: No equipment provided  Reasons for discharge: treatment goals met and discharge from hospital  Patient/family agrees with progress made and goals achieved: Yes  PT Discharge Pain Interference Pain Interference Pain Effect on Sleep: 0. Does not apply - I have not had any pain or hurting in the past 5 days Pain Interference with Therapy Activities: 0. Does not apply - I have not received rehabilitationtherapy in the past 5 days Pain Interference with Day-to-Day Activities: 1. Rarely or not at all Cognition Overall Cognitive Status: Impaired/Different from baseline Arousal/Alertness: Awake/alert Memory: Impaired Awareness: Impaired Problem Solving: Impaired Safety/Judgment: Impaired Sensation Sensation Light Touch:  (UTA due to cognitive deficits) Proprioception: Impaired by gross assessment Additional Comments: difficult to assess d/t pt's aphasia vs cognition/ understanding vs inability to follow instructions Coordination Gross Motor Movements are Fluid and Coordinated: Yes Fine Motor Movements are Fluid and  Coordinated: No Coordination and Movement Description: grossly WFL within visual limitations Finger Nose Finger Test: grossly WFL within visual limitations Heel Shin Test: [facility] Motor  Motor Motor: Within Functional Limits;Other (comment) (L UE hemi; visual limitations impair coordination WFL) Motor - Skilled Clinical Observations: L UE hemi; visual limitations impair coordination WFL  Mobility Bed Mobility Left Sidelying to Sit: Independent with assistive device Supine to Sit: Independent with assistive device Sit to Supine: Independent with assistive device Transfers Transfers: Sit to Stand;Stand Pivot Transfers;Stand to Sit Sit to Stand: Supervision/Verbal cueing Stand to Sit: Supervision/Verbal cueing Stand Pivot Transfers: Supervision/Verbal cueing Transfer (Assistive device): None Locomotion  Gait Ambulation: Yes Gait Assistance: Supervision/Verbal cueing Gait Distance (Feet): 500 Feet Assistive device: None Gait Gait: Yes Gait Pattern: Within Functional Limits Gait velocity: WFL Stairs / Additional Locomotion Stairs: Yes Stairs Assistance: Supervision/Verbal cueing Stair Management Technique: Alternating pattern;Other (comment) (one handrail) Number of Stairs: 12 Height of Stairs: 6 Ramp: Supervision/Verbal cueing Curb: Supervision/Verbal cueing Wheelchair Mobility Wheelchair Mobility: No  Trunk/Postural Assessment  Cervical Assessment Cervical Assessment: Exceptions to [facility] (forward head) Thoracic Assessment Thoracic Assessment: Exceptions to [facility] (rounded shoulders) Lumbar Assessment Lumbar Assessment: Within Functional Limits Postural Control Postural Control: Within Functional Limits Righting Reactions: WFL Protective Responses: WFL  Balance Balance Balance Assessed: Yes Static Sitting Balance Static Sitting - Balance Support: Feet supported;No upper extremity supported Static Sitting - Level of Assistance: 6: Modified independent (Device/Increase  time) Dynamic Sitting Balance Dynamic Sitting - Balance Support: Feet supported;No upper extremity supported;During functional activity Dynamic Sitting - Level of Assistance: 6: Modified independent (Device/Increase time) Static Standing Balance Static Standing - Balance Support: During functional activity;No upper extremity supported Static Standing - Level of Assistance: 5: Stand by assistance Dynamic Standing Balance Dynamic Standing - Balance Support: During functional activity;No upper extremity supported Dynamic Standing -  Level of Assistance: 5: Stand by assistance Extremity Assessment  RLE Assessment RLE Assessment: Within Functional Limits LLE Assessment LLE Assessment: Within Functional Limits General Strength Comments: grossly 4/5 compared to 5/5 on right LE   Treatment Session 1:   Pt agreeable to PT and denies pain. Supervision with transfers and mobility >300' throughout unit no AD. PT assessed pain interference, sensation, coordination, strength, and balance in preparation for discharge. Pt engaged in discussion with PT regarding prior household activities pt performed. Pt educated to have supervision with all activities and practice household chore (sweeping) various items and able to squat and use dust pan. Pt able to coordinate bilateral UE's with task and hold broom while sweeping into dustpan. PT recommended pt not take garbage cans out as [place] terrain. Pt returned to room, left seated at bedside with all needs in reach. PT cleared pt's spouse for transfers and mobility in room and safety plan updated.    Treatment Session 2:   Pt agreeable to PT session and supervision with gait throughout unit without AD. Pt performed upper and lower limb coordination sport related tasks as pt reports he enjoys playing with his children. Pt supervision to min A for reactive balance as he kicked soccer ball with R LE. Pt able to perform light jog in forward, backward, and  lateral direction to retrieve ball. Pt transitioned to bump passes with volleyball with B UE's with good integration of L UE. Pt challenged to perform on cobblestones, deferred due to fatigue. Pt able to perform ~10 squats on cobblestones min A. PT engaged in discussion with pt and spouse regarding activity level, safety, fall risk, and supervision goal levels. Pt returned to room and left seated at bedside with all needs in reach.    Truitt Leep Truitt Leep PT, DPT  09/24/2023, 8:16 AM

## 2023-09-24 NOTE — Plan of Care (Signed)
  Problem: RH BLADDER ELIMINATION Goal: RH STG MANAGE BLADDER WITH ASSISTANCE Description: STG Manage Bladder With Assistance Outcome: Progressing   Problem: RH SKIN INTEGRITY Goal: RH STG SKIN FREE OF INFECTION/BREAKDOWN Outcome: Progressing   Problem: RH SAFETY Goal: RH STG ADHERE TO SAFETY PRECAUTIONS W/ASSISTANCE/DEVICE Description: STG Adhere to Safety Precautions With Assistance/Device. Outcome: Progressing   Problem: RH COGNITION-NURSING Goal: RH STG USES MEMORY AIDS/STRATEGIES W/ASSIST TO PROBLEM SOLVE Description: STG Uses Memory Aids/Strategies With Assistance to Problem Solve. Outcome: Progressing   Problem: RH PAIN MANAGEMENT Goal: RH STG PAIN MANAGED AT OR BELOW PT'S PAIN GOAL Outcome: Progressing   Problem: RH KNOWLEDGE DEFICIT Goal: RH STG INCREASE KNOWLEDGE OF HYPERTENSION Outcome: Progressing

## 2023-09-24 NOTE — Progress Notes (Signed)
 Inpatient Rehabilitation Care Coordinator Discharge Note   Patient Details  Name: Alan Wise MRN: [id number] Date of Birth: 1970-12-10   Discharge location: HOME WITH WIFE AND MOM TO ASSIST-24/7 SUPERVISION  Length of Stay: 5 DAYS  Discharge activity level: MOD/I LEVEL  Home/community participation: ACTIVE  Patient response NW:GNFAOZ Literacy - How often do you need to have someone help you when you read instructions, pamphlets, or other written material from your doctor or pharmacy?: Always  Patient response HY:[id number] Isolation - How often do you feel lonely or isolated from those around you?: Sometimes  Services provided included: MD, RD, PT, OT, SLP, RN, CM, Pharmacy, SW  Financial Services:  Financial Services Utilized: Private Insurance [employer]  Choices offered to/list presented to: PT AND WIFE  Follow-up services arranged:  Outpatient    Outpatient Servicies: [facility]  PT  OT  SP  WILL CALL TO SET UP FOLLOW UP APPOINTMENTS      Patient response to transportation need: Is the patient able to respond to transportation needs?: Yes In the past 12 months, has lack of transportation kept you from medical appointments or from getting medications?: No In the past 12 months, has lack of transportation kept you from meetings, work, or from getting things needed for daily living?: No   Patient/Family verbalized understanding of follow-up arrangements:  Yes  Individual responsible for coordination of the follow-up plan: SELF AND Alan Wise [phone]  Confirmed correct DME delivered: Alan Wise 09/24/2023    Comments (or additional information):PT TENDS TO DO WHAT HE WANTS AND AT TIMES IS DANGEROUS TO HIMSELF-IE REFUSING ALL MEDICATIONS. IF CONTINUES TO HAND PICK WHICH MEDICATION HE WILL TAKE WILL BE HIGH RISK TO RETURN TO THE HOSPITAL  Summary of Stay    Date/Time Discharge Planning CSW  09/22/23 [phone] HOme with wife who is currently  staying with pt, due to concerns regarding refusal of medications. Pt's parents involved but not always helpful to pt and his views RGD       Alan Wise, Lemar Livings

## 2023-09-25 ENCOUNTER — Other Ambulatory Visit (HOSPITAL_COMMUNITY): Payer: Self-pay

## 2023-09-25 ENCOUNTER — Encounter: Payer: Self-pay | Admitting: Internal Medicine

## 2023-09-25 MED ORDER — AMLODIPINE BESYLATE 5 MG PO TABS
5.0000 mg | ORAL_TABLET | Freq: Every day | ORAL | 0 refills | Status: DC
  Filled 2023-09-25 (×2): qty 30, 30d supply, fill #0

## 2023-09-25 MED ORDER — DIAZEPAM (15 MG DOSE) 2 X 7.5 MG/0.1ML NA LQPK
15.0000 mg | Freq: Once | NASAL | 0 refills | Status: DC | PRN
  Filled 2023-09-25: qty 2, 30d supply, fill #0

## 2023-09-25 NOTE — Progress Notes (Signed)
 Patient continue to refuse Eliqus po, states MD is aware

## 2023-09-25 NOTE — Progress Notes (Signed)
 Inpatient Rehabilitation Discharge Medication Review by a Pharmacist  A complete drug regimen review was completed for this patient to identify any potential clinically significant medication issues.  High Risk Drug Classes Is patient taking? 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 )    Antibiotic No   Opioid No   Antiplatelet No   Hypoglycemics/insulin No   Vasoactive Medication Yes Amlodipine, losartan, propranolol - HTN   Chemotherapy No   Other Yes Acetaminophen- pain Atorvastatin - HLD Cyanocobalamin , Vitamin D- supplements  Lamotrigine, Divalproex - seizure disorder Diazepam - prn seizures      Type of Medication Issue Identified Description of Issue Recommendation(s)  Drug Interaction(s) (clinically significant)     Duplicate Therapy   D/C ASA with the addition of Apixaban  Allergy     No Medication Administration End Date     Incorrect Dose     Additional Drug Therapy Needed   Restart Diazepam (Valtoco) prn seizure rescue medication  Significant med changes from prior encounter (inform family/care partners about these prior to discharge).  Dose adjustments made to Amlodipine and Losartan  Other       Clinically significant medication issues were identified that warrant physician communication and completion of prescribed/recommended actions by midnight of the next day:  Yes  Name of provider notified for urgent issues identified: Deatra Ina, PA  Provider Method of Notification: secure chat    Pharmacist comments: Changes Accepted  Time spent performing this drug regimen review (minutes):  15   Alan Wise, [name] 09/25/2023 7:39 AM

## 2023-09-25 NOTE — Progress Notes (Signed)
 PROGRESS NOTE   Subjective/Complaints:  Pt reports ready for d/c. Discussed d/c  with pt and wife and not taking Eliquis yet.   ROS: Limited by aphasia/cognition  Objective:   No results found. No results for input(s): "WBC", "HGB", "HCT", "PLT" in the last 72 hours.  No results for input(s): "NA", "K", "CL", "CO2", "GLUCOSE", "BUN", "CREATININE", "CALCIUM" in the last 72 hours.   Intake/Output Summary (Last 24 hours) at 09/25/2023 0835 Last data filed at 09/24/2023 1815 Gross per 24 hour  Intake 476 ml  Output --  Net 476 ml         Physical Exam: Vital Signs Blood pressure (!) 142/85, pulse 63, temperature 97.8 F (36.6 C), temperature source Oral, resp. rate 18, height 5\' 8"  (1.727 m), weight 64.5 kg, SpO2 98%.      General: awake, alert, appropriate, standing at sink brushing teeth;  NAD HENT: conjugate gaze; oropharynx moist CV: regular rate and rhythm; no JVD Pulmonary: CTA B/L; no W/R/R- good air movement GI: soft, NT, ND, (+)BS Psychiatric: appropriate Neuro; Apahsic   Assessment/Plan: 1. Functional deficits which require 3+ hours per day of interdisciplinary therapy in a comprehensive inpatient rehab setting. Physiatrist is providing close team supervision and 24 hour management of active medical problems listed below. Physiatrist and rehab team continue to assess barriers to discharge/monitor patient progress toward functional and medical goals  Care Tool:  Bathing    Body parts bathed by patient: Right arm, Left arm, Chest, Front perineal area, Abdomen, Right upper leg, Left upper leg, Right lower leg, Left lower leg, Face, Buttocks   Body parts bathed by helper: Buttocks     Bathing assist Assist Level: Supervision/Verbal cueing     Upper Body Dressing/Undressing Upper body dressing   What is the patient wearing?: Pull over shirt    Upper body assist Assist Level: Supervision/Verbal  cueing    Lower Body Dressing/Undressing Lower body dressing      What is the patient wearing?: Pants, Underwear/pull up     Lower body assist Assist for lower body dressing: Supervision/Verbal cueing     Toileting Toileting    Toileting assist Assist for toileting: Supervision/Verbal cueing     Transfers Chair/bed transfer  Transfers assist     Chair/bed transfer assist level: Supervision/Verbal cueing     Locomotion Ambulation   Ambulation assist      Assist level: Supervision/Verbal cueing Assistive device: No Device Max distance: 500'   Walk 10 feet activity   Assist     Assist level: Supervision/Verbal cueing Assistive device: No Device   Walk 50 feet activity   Assist    Assist level: Supervision/Verbal cueing Assistive device: No Device    Walk 150 feet activity   Assist    Assist level: Supervision/Verbal cueing Assistive device: No Device    Walk 10 feet on uneven surface  activity   Assist Walk 10 feet on uneven surfaces activity did not occur: Safety/medical concerns   Assist level: Supervision/Verbal cueing     Wheelchair     Assist Is the patient using a wheelchair?: No Type of Wheelchair: Manual    Wheelchair assist level: Dependent - Patient 0% Max  wheelchair distance: 300 ft    Wheelchair 50 feet with 2 turns activity    Assist        Assist Level: Dependent - Patient 0%   Wheelchair 150 feet activity     Assist      Assist Level: Dependent - Patient 0%   Blood pressure (!) 142/85, pulse 63, temperature 97.8 F (36.6 C), temperature source Oral, resp. rate 18, height 5\' 8"  (1.727 m), weight 64.5 kg, SpO2 98%.  Medical Problem List and Plan: 1. 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  Antithrombotics: -DVT/anticoagulation:  Pharmaceutical: Eliquis 5 mg twice daily 4/3- advised wife that would really like pt to take Eliquis.              -antiplatelet therapy: n/a   3. Pain Management: As needed Tylenol   4. Mood/Behavior/Sleep: No current medications             -antipsychotic agents: n/a             -Sleep log added    5. Neuropsych/cognition: This patient is not capable of making decisions on his own behalf.             -Reporting ongoing confusion/delirium.  Delirium precautions placed.               3/31- pt refusing ALL meds, no matter what- getting more agitated- might need IM meds?  4/2- pt starting to take seizure meds- 4/3- pt willing ot take 'something" today- hopeful will take Eliquis  6. Skin/Wound Care: Standard precautions and orders.   7. Fluids/Electrolytes/Nutrition: CMET in a.m.  On regular diet.             -On nutritional supplements twice daily with meals             -B12 supplementation 8. Seizure disorder -Seizure precautions added -Last known seizure 3-26 -Lamictal 150 mg every morning, 200 mg every afternoon -As needed IV Ativan 1 to 2 mg for seizures added   3.31- at risk for more seizures and concern for needing to go back to acute- pt just got agitated when discussed- wife agreeable to treatment, but pt threatened to pull any tube.   4/2- pt now taking Lamictal and plans to add back another medicine tomorrow-  9. Dysphagia -On regular diet and intake; pending SLP assessment   4/2- eating well on D3 diet 10. type 2 diabetes with hyperglycemia.  SSI 3 times daily AC.   3/31- A1c 5.4- down from 6.3  -will stop CBG's once he's doing better-   4/2- will stop CBGs since A1c 5.4 11. Hypertension.  Continue amlodipine 10 mg daily, losartan 25 mg daily, propranolol 20 mg twice daily.   3/31- pt refusing all meds- at risk for new CVA and possible death- will call Palliative care. 4/3-  Pt's BP is running 120s-140s without BP meds- will reduce Norvasc to 5 mg daily since BP is doing pretty well without meds- heart rat ien 90's - so would benefit from Propranolol, but don't want to drop his BP too  low.   12. Noncompliance-refusing all meds for "3 days".    3/31- sounds like pt perseverating on "overdose" from meds- an idea he got from his mother-will call Palliative care and see what might be able ot be done? Also pt's wife reached out to Dr Alan Wise as well.   Will reach out to Neuro as well to see if they have any ideas?  4/1- reached out to Dr Alan Wise- who is coming today to see pt; Neuro- and palliative care- pt refusing all PO meds except baby ASA possibly, and all IV meds- wife doesn't want pt sedated to give meds- He's now stated per chart and d/w pt (wife not in room this AM)- that he will start meds- 1 med at a time on day 3- and do more every "few days"- continues to perseverate that he's overdosing on meds and needs a break from them- spoke with Nursing at length about this- wife aware that if she seizes, will have to leave CIR and is at increased risk of stroke and possibly death with current lack of medicine. Calling wife to discuss again today- in spite of all discussions,still  refusing all meds. Does recognize today is ending day 2-  4/2- pt took Lamictal yesterday afternoon as well night time Lamictal- and plans to take this AM!  - pt doesn't want to take bowel meds- but we discussed ways to treat constipation  4/4- pt still not taking Eliquis- but d/w wife need to get started on this- ASA won't cut it 13. Constipation  4/2- LBM 4-5 days ago- pt not willing to take bowel meds, but will take fruit and fruit juices- encouraged pt and wife to do so except no banana and no apple- they cause constipation.   4/3- pt emphatic had BM this AM- don't see documented. But abd exam more c/w having BM  4/4- LBM 5 days ago- doesn't want meds- refused  I spent a total of  36  minutes on  total care today- >50% coordination of care- due to  D/w wife as well as pt about d/c- f/u's and d/c meds- not taking Eliquis, but per wife, pt plans on it "soon"  LOS: 5 days A FACE TO FACE EVALUATION WAS PERFORMED  Alan Wise 09/25/2023, 8:35 AM

## 2023-09-28 ENCOUNTER — Inpatient Hospital Stay: Attending: Internal Medicine | Admitting: Licensed Clinical Social Worker

## 2023-09-28 ENCOUNTER — Encounter: Admitting: Speech Pathology

## 2023-09-28 ENCOUNTER — Telehealth: Payer: Self-pay | Admitting: *Deleted

## 2023-09-28 DIAGNOSIS — Z79899 Other long term (current) drug therapy: Secondary | ICD-10-CM | POA: Insufficient documentation

## 2023-09-28 DIAGNOSIS — Z7901 Long term (current) use of anticoagulants: Secondary | ICD-10-CM | POA: Insufficient documentation

## 2023-09-28 DIAGNOSIS — C7951 Secondary malignant neoplasm of bone: Secondary | ICD-10-CM | POA: Insufficient documentation

## 2023-09-28 DIAGNOSIS — Z7982 Long term (current) use of aspirin: Secondary | ICD-10-CM | POA: Insufficient documentation

## 2023-09-28 DIAGNOSIS — C711 Malignant neoplasm of frontal lobe: Secondary | ICD-10-CM | POA: Insufficient documentation

## 2023-09-28 DIAGNOSIS — C710 Malignant neoplasm of cerebrum, except lobes and ventricles: Secondary | ICD-10-CM

## 2023-09-28 NOTE — Telephone Encounter (Signed)
 Wife called to report that they are filing for disability today and to expect paperwork in the mail soon.

## 2023-09-28 NOTE — Progress Notes (Signed)
 [facility] CSW Progress Note  Visual merchandiser  spoke with pt's wife over the phone.  Pt recently hospitalized and discharged from rehab.  Pt at this time will need to apply for SSDI.  [facility] referral which was signed in January by pt will be submitted on behalf of pt.  CSW discussed next steps at work for applying for short term disability as well.  Pt's spouse added to the caregiver support group.  CSW to remain available as appropriate throughout duration of treatment.        Rachel Moulds, LCSW Clinical Social Worker [facility]

## 2023-09-30 ENCOUNTER — Encounter: Admitting: Speech Pathology

## 2023-09-30 NOTE — Telephone Encounter (Signed)
 Per chart review, pt scheduled with Suzann on 10/20/23.

## 2023-10-02 ENCOUNTER — Other Ambulatory Visit: Payer: Self-pay | Admitting: Internal Medicine

## 2023-10-02 ENCOUNTER — Ambulatory Visit (HOSPITAL_COMMUNITY)
Admission: RE | Admit: 2023-10-02 | Discharge: 2023-10-02 | Disposition: A | Discharge status: Home / Self Care | Source: Ambulatory Visit | Attending: Internal Medicine | Admitting: Internal Medicine

## 2023-10-02 DIAGNOSIS — Y842 Radiological procedure and radiotherapy as the cause of abnormal reaction of the patient, or of later complication, without mention of misadventure at the time of the procedure: Secondary | ICD-10-CM

## 2023-10-02 DIAGNOSIS — I639 Cerebral infarction, unspecified: Secondary | ICD-10-CM | POA: Diagnosis present

## 2023-10-02 DIAGNOSIS — C719 Malignant neoplasm of brain, unspecified: Secondary | ICD-10-CM

## 2023-10-02 DIAGNOSIS — I6789 Other cerebrovascular disease: Secondary | ICD-10-CM | POA: Insufficient documentation

## 2023-10-02 MED ORDER — GADOBUTROL 1 MMOL/ML IV SOLN
6.0000 mL | Freq: Once | INTRAVENOUS | Status: AC | PRN
  Administered 2023-10-02: 6 mL via INTRAVENOUS

## 2023-10-03 ENCOUNTER — Other Ambulatory Visit: Payer: Self-pay | Admitting: Internal Medicine

## 2023-10-05 ENCOUNTER — Inpatient Hospital Stay

## 2023-10-05 ENCOUNTER — Inpatient Hospital Stay: Payer: 59

## 2023-10-05 ENCOUNTER — Inpatient Hospital Stay: Payer: 59 | Admitting: Internal Medicine

## 2023-10-05 ENCOUNTER — Other Ambulatory Visit: Payer: Self-pay

## 2023-10-05 VITALS — BP 154/80 | HR 64 | Temp 97.9°F | Resp 16 | Wt 145.3 lb

## 2023-10-05 DIAGNOSIS — Z7901 Long term (current) use of anticoagulants: Secondary | ICD-10-CM | POA: Diagnosis not present

## 2023-10-05 DIAGNOSIS — I6789 Other cerebrovascular disease: Secondary | ICD-10-CM | POA: Diagnosis not present

## 2023-10-05 DIAGNOSIS — C711 Malignant neoplasm of frontal lobe: Secondary | ICD-10-CM | POA: Diagnosis present

## 2023-10-05 DIAGNOSIS — I639 Cerebral infarction, unspecified: Secondary | ICD-10-CM | POA: Diagnosis not present

## 2023-10-05 DIAGNOSIS — C7951 Secondary malignant neoplasm of bone: Secondary | ICD-10-CM | POA: Diagnosis not present

## 2023-10-05 DIAGNOSIS — Y842 Radiological procedure and radiotherapy as the cause of abnormal reaction of the patient, or of later complication, without mention of misadventure at the time of the procedure: Secondary | ICD-10-CM

## 2023-10-05 DIAGNOSIS — C710 Malignant neoplasm of cerebrum, except lobes and ventricles: Secondary | ICD-10-CM | POA: Diagnosis not present

## 2023-10-05 DIAGNOSIS — R569 Unspecified convulsions: Secondary | ICD-10-CM | POA: Diagnosis not present

## 2023-10-05 DIAGNOSIS — C719 Malignant neoplasm of brain, unspecified: Secondary | ICD-10-CM

## 2023-10-05 DIAGNOSIS — Z7982 Long term (current) use of aspirin: Secondary | ICD-10-CM | POA: Diagnosis not present

## 2023-10-05 DIAGNOSIS — Z79899 Other long term (current) drug therapy: Secondary | ICD-10-CM | POA: Diagnosis not present

## 2023-10-05 LAB — CBC WITH DIFFERENTIAL (CANCER CENTER ONLY)
Abs Immature Granulocytes: 0.02 10*3/uL (ref 0.00–0.07)
Basophils Absolute: 0.1 10*3/uL (ref 0.0–0.1)
Basophils Relative: 1 %
Eosinophils Absolute: 0.8 10*3/uL — ABNORMAL HIGH (ref 0.0–0.5)
Eosinophils Relative: 9 %
HCT: 43.2 % (ref 39.0–52.0)
Hemoglobin: 14.9 g/dL (ref 13.0–17.0)
Immature Granulocytes: 0 %
Lymphocytes Relative: 17 %
Lymphs Abs: 1.4 10*3/uL (ref 0.7–4.0)
MCH: 29.3 pg (ref 26.0–34.0)
MCHC: 34.5 g/dL (ref 30.0–36.0)
MCV: 85 fL (ref 80.0–100.0)
Monocytes Absolute: 0.5 10*3/uL (ref 0.1–1.0)
Monocytes Relative: 7 %
Neutro Abs: 5.6 10*3/uL (ref 1.7–7.7)
Neutrophils Relative %: 66 %
Platelet Count: 207 10*3/uL (ref 150–400)
RBC: 5.08 MIL/uL (ref 4.22–5.81)
RDW: 12.5 % (ref 11.5–15.5)
WBC Count: 8.3 10*3/uL (ref 4.0–10.5)
nRBC: 0 % (ref 0.0–0.2)

## 2023-10-05 LAB — TOTAL PROTEIN, URINE DIPSTICK: Protein, ur: 30 mg/dL — AB

## 2023-10-05 NOTE — Progress Notes (Signed)
 [facility] [address]  [place], [place] [address] [phone]  Interval Evaluation  Date of Service: 10/05/23 Patient Name: Alan Wise Patient MRN: [id number] Patient DOB: 04-28-1971 Provider: Mamie Searles, MD  Identifying Statement:  Alan Wise is a 53 y.o. 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 Andy Bannister; path demonstrates IDH-1 mutant oligodendroglioma   03/20/2021 - 04/30/2021 Radiation Therapy   Completes 54 Gy IMRT and Temodar with Dr. Lurena Sally   06/02/2021 - 03/05/2022 Chemotherapy   Completes 8 cycles of adjuvant 5-day Temozolomide   02/01/2022 Progression   Progression of disease in multifocal/metastatic/lmd pattern   03/11/2022 Progression   Progression of disease confirmed after additional cycle of TMZ, 1 month follow up MRI.  LP/cytology and MRI spine mets screening unremarkable   04/10/2022 Surgery   Stereotactic biopsy of progressive R frontal lesion at [facility] with Dr. Reather Campbell.  Path demonstrates radiation necrosis   12/03/2022 Surgery   Repeat biopsy with Dr. Reather Campbell, R frontal.  Path again demonstrates necrosis and treatment effect   03/12/2023 -  Chemotherapy   Patient is on Treatment Plan : BRAIN GBM Bevacizumab 14d x 6 cycles       Biomarkers:   Interval History: Alan Wise presents today following recent admission, MRI brain.  Patient and wife describe modest improvement in activity, energy since discharge.  He still has a lot of difficulty with speech and communication, as well as vision issues on the right side.  Fortunately, there have not been additional seizures since the Lamictal was restarted.  He is dosing aspirin 81mg  daily in place of the Eliquis.   H+P (12/31/20) Patient presented to medical attention this past month, with complaint of involuntary left sided shaking.  He describes episodes,  occurring several times per week, of left arm shaking, lasting up to 5 minutes; following the episodes he describes the arm as "heavy" for some time before returning to normal.  He also describes episodes of sudden inability to speak, with strange movements of face and hands, lasting same amount of time.  These episodes go back ~10 years in very sporadic nature, but have become much more frequent in recent months.  He works full time as a Agricultural consultant at [employer].  These episodes have occurred at work, and have been disruptive in that environment.  He is otherwise fully functional, independent.  Medications: Current Outpatient Medications on File Prior to Visit  Medication Sig Dispense Refill   amLODipine (NORVASC) 5 MG tablet Take 1 tablet (5 mg total) by mouth daily. 30 tablet 0   ASPIRIN LOW DOSE 81 MG tablet Take 81 mg by mouth daily.     lamoTRIgine (LAMICTAL) 25 MG tablet Take 6 tablets (150 mg total) by mouth every morning AND 8 tablets (200 mg total) at bedtime. 420 tablet 0   propranolol (INDERAL) 20 MG tablet Take 1 tablet (20 mg total) by mouth 2 (two) times daily. 60 tablet 0   acetaminophen (TYLENOL) 325 MG tablet Take 2 tablets (650 mg total) by mouth every 6 (six) hours as needed for mild pain (pain score 1-3) (or Fever >/= 101). (Patient not taking: Reported on 10/05/2023)     apixaban (ELIQUIS) 5 MG TABS tablet Take 1 tablet (5 mg total) by mouth 2 (two) times daily. (Patient not taking: Reported on 10/05/2023) 60 tablet 0   atorvastatin (LIPITOR) 40 MG  tablet Take 1 tablet (40 mg total) by mouth daily. (Patient not taking: Reported on 10/05/2023) 30 tablet 0   cholecalciferol (VITAMIN D3) 25 MCG (1000 UNIT) tablet Take 1 tablet (1,000 Units total) by mouth daily. (Patient not taking: Reported on 10/05/2023) 30 tablet 0   cyanocobalamin 1000 MCG tablet Take 1 tablet (1,000 mcg total) by mouth daily. (Patient not taking: Reported on 10/05/2023) 30 tablet 0   diazePAM, 15 MG Dose, 2  x 7.5 MG/0.1ML LQPK Place 15 mg into the nose once as needed for up to 1 dose (seizure). (Patient not taking: Reported on 10/05/2023) 2 each 0   divalproex (DEPAKOTE) 500 MG DR tablet Take 1 tablet (500 mg total) by mouth every 8 (eight) hours. (Patient not taking: Reported on 10/05/2023) 90 tablet 0   losartan (COZAAR) 25 MG tablet Take 1 tablet (25 mg total) by mouth daily. (Patient not taking: Reported on 10/05/2023) 30 tablet 0   No current facility-administered medications on file prior to visit.    Allergies:  Allergies  Allergen Reactions   Contrast Media [Iodinated Contrast Media] Swelling   Past Medical History:  Past Medical History:  Diagnosis Date   Allergy    Complication of anesthesia    pt reports he is starting to have more difficulty arousing after surgery   Diabetes mellitus (HCC)    GERD (gastroesophageal reflux disease)    Headache    History of kidney stones    Hyperlipidemia    Renal disorder    kidney stones   Past Surgical History:  Past Surgical History:  Procedure Laterality Date   APPLICATION OF CRANIAL NAVIGATION Right 01/17/2021   Procedure: APPLICATION OF CRANIAL NAVIGATION;  Surgeon: Van Gelinas, MD;  Location: [facility] OR;  Service: Neurosurgery;  Laterality: Right;   COLONOSCOPY  09/03/1994   COLONOSCOPY WITH PROPOFOL N/A 01/07/2023   Procedure: COLONOSCOPY WITH PROPOFOL;  Surgeon: Marnee Sink, MD;  Location: [facility] ENDOSCOPY;  Service: Endoscopy;  Laterality: N/A;  NOT TOO EARLY   CYSTOSCOPY WITH STENT PLACEMENT Right 01/25/2016   Procedure: CYSTOSCOPY WITH STENT PLACEMENT;  Surgeon: Bart Born, MD;  Location: [facility] ORS;  Service: Urology;  Laterality: Right;   FRAMELESS  BIOPSY WITH BRAINLAB Right 01/17/2021   Procedure: RIGHT STEREOTACTIC BIOPSY OF INSULAR LESION;  Surgeon: Van Gelinas, MD;  Location: [facility] OR;  Service: Neurosurgery;  Laterality: Right;   KNEE ARTHROSCOPY W/ MENISCAL REPAIR Right 06/23/1988   POLYPECTOMY  01/07/2023    Procedure: POLYPECTOMY;  Surgeon: Marnee Sink, MD;  Location: [facility] ENDOSCOPY;  Service: Endoscopy;;   removal of birthmark  06/08/2013   SKIN CANCER EXCISION  06/23/2012   TYMPANOSTOMY TUBE PLACEMENT  08/06/1978   URETEROSCOPY WITH HOLMIUM LASER LITHOTRIPSY Right 01/25/2016   Procedure: URETEROSCOPY WITH HOLMIUM LASER LITHOTRIPSY;  Surgeon: Bart Born, MD;  Location: [facility] ORS;  Service: Urology;  Laterality: Right;   URETHRAL STRICTURE DILATATION     visual inspection of vocal cord     1973   WISDOM TOOTH EXTRACTION     Social History:  Social History   Socioeconomic History   Marital status: Married    Spouse name: Not on file   Number of children: Not on file   Years of education: Not on file   Highest education level: Not on file  Occupational History   Not on file  Tobacco Use   Smoking status: Never   Smokeless tobacco: Never  Vaping Use   Vaping status: Never Used  Substance and  Sexual Activity   Alcohol use: Not Currently    Comment: rare   Drug use: No   Sexual activity: Not Currently  Other Topics Concern   Not on file  Social History Narrative   Not on file   Social Drivers of Health   Financial Resource Strain: Low Risk  (12/05/2022)   [facility]   Overall Financial Resource Strain (CARDIA)    Difficulty of Paying Living Expenses: Not hard at all  Food Insecurity: No Food Insecurity (09/20/2023)   Hunger Vital Sign    Worried About Running Out of Food in the Last Year: Never true    Ran Out of Food in the Last Year: Never true  Transportation Needs: No Transportation Needs (09/20/2023)   PRAPARE - Administrator, Civil Service (Medical): No    Lack of Transportation (Non-Medical): No  Physical Activity: Not on file  Stress: Not on file  Social Connections: Moderately Isolated (09/20/2023)   Social Connection and Isolation Panel [NHANES]    Frequency of Communication with  Friends and Family: More than three times a week    Frequency of Social Gatherings with Friends and Family: More than three times a week    Attends Religious Services: Never    Database administrator or Organizations: No    Attends Banker Meetings: Never    Marital Status: Married  Catering manager Violence: Not At Risk (09/20/2023)   Humiliation, Afraid, Rape, and Kick questionnaire    Fear of Current or Ex-Partner: No    Emotionally Abused: No    Physically Abused: No    Sexually Abused: No   Family History:  Family History  Problem Relation Age of Onset   Hyperlipidemia Father    Hypertension Father    Diabetes Father    Benign prostatic hyperplasia Father    Stroke Maternal Grandmother    Diabetes Maternal Grandmother    Stroke Paternal Grandmother    Hypertension Paternal Grandmother    Kidney disease Neg Hx    Prostate cancer Neg Hx     Review of Systems: Constitutional: Doesn't report fevers, chills or abnormal weight loss Eyes: Doesn't report blurriness of vision Ears, nose, mouth, throat, and face: Doesn't report sore throat Respiratory: Doesn't report cough, dyspnea or wheezes Cardiovascular: Doesn't report palpitation, chest discomfort  Gastrointestinal:  Doesn't report nausea, constipation, diarrhea GU: Doesn't report incontinence Skin: Doesn't report skin rashes Neurological: Per HPI Musculoskeletal: Doesn't report joint pain Behavioral/Psych: Doesn't report anxiety  Physical Exam: Vitals:   10/05/23 1007 10/05/23 1008  BP: (!) 156/81 (!) 154/80  Pulse: 64   Resp: 16   Temp: 97.9 F (36.6 C)   SpO2: 98%    KPS: 70. General: Fatigued Head: Normal EENT: No conjunctival injection or scleral icterus.  Lungs: Resp effort normal Cardiac: Regular rate Abdomen: Non-distended abdomen Skin: No rashes cyanosis or petechiae. Extremities: No clubbing or edema  Neurologic Exam: Mental Status: Awake, alert, attentive to examiner. Oriented to  self and environment.  Language is notable for impaired fluency, with additional impairments in comprehension.  Modest psychomotor slowing, impaired 3 object recall Cranial Nerves: Visual acuity is grossly normal. Right field hemianopia. Extra-ocular movements intact. No ptosis. Mild UMN right facial paresis. Motor: Tone and bulk are normal. Power is full in both arms and legs. Reflexes are symmetric, no pathologic reflexes present.  Sensory: Intact to light touch Gait: Normal.   Labs: I have reviewed the  data as listed    Component Value Date/Time   NA 140 09/21/2023 0540   NA 138 05/04/2012 1815   K 4.3 09/21/2023 0540   K 3.5 05/04/2012 1815   CL 101 09/21/2023 0540   CL 99 05/04/2012 1815   CO2 25 09/21/2023 0540   CO2 29 05/04/2012 1815   GLUCOSE 102 (H) 09/21/2023 0540   GLUCOSE 69 05/04/2012 1815   BUN 13 09/21/2023 0540   BUN 12 05/04/2012 1815   CREATININE 1.03 09/21/2023 0540   CREATININE 0.95 08/19/2023 0818   CALCIUM 9.6 09/21/2023 0540   CALCIUM 9.2 05/04/2012 1815   PROT 6.4 (L) 09/21/2023 0540   PROT 8.5 (H) 05/04/2012 1815   ALBUMIN 3.8 09/21/2023 0540   ALBUMIN 4.5 05/04/2012 1815   AST 19 09/21/2023 0540   AST 15 05/22/2023 0936   ALT 29 09/21/2023 0540   ALT 16 05/22/2023 0936   ALT 77 05/04/2012 1815   ALKPHOS 71 09/21/2023 0540   ALKPHOS 129 05/04/2012 1815   BILITOT 0.6 09/21/2023 0540   BILITOT 0.5 05/22/2023 0936   GFRNONAA >60 09/21/2023 0540   GFRNONAA >60 05/22/2023 0936   GFRNONAA 106 12/05/2020 1014   GFRAA 123 12/05/2020 1014   Lab Results  Component Value Date   WBC 8.3 10/05/2023   NEUTROABS 5.6 10/05/2023   HGB 14.9 10/05/2023   HCT 43.2 10/05/2023   MCV 85.0 10/05/2023   PLT 207 10/05/2023   Imaging:  [facility] Clinician Interpretation: I have personally reviewed the CNS images as listed.  My interpretation, in the context of the patient's clinical presentation, is stable disease  MR ANGIO NECK W WO CONTRAST Result Date:  10/02/2023 CLINICAL DATA:  Stroke follow-up, history of brain/CNS neoplasm, multiple infarcts on recent prior MRIs. EXAM: MRA NECK WITHOUT AND WITH CONTRAST MRA HEAD WITHOUT CONTRAST TECHNIQUE: Multiplanar and multiecho pulse sequences of the neck were obtained without and with intravenous contrast. Angiographic images of the neck were obtained using MRA technique without and with intravenous contrast; Angiographic images of the Circle of Willis were obtained using MRA technique without intravenous contrast. CONTRAST:  6mL GADAVIST GADOBUTROL 1 MMOL/ML IV SOLN COMPARISON:  Same day MRI head, additional MRI head 09/15/2023 and earlier. MRA head and neck 09/09/2023. FINDINGS: MRA NECK FINDINGS Aortic arch: Visualized portions of the aortic arch within normal limits. Three vessel configuration of the aortic arch. No significant stenosis at the origins of the brachiocephalic and left common carotid arteries. Left subclavian artery origin is not well visualized. Right carotid system: The right carotid artery is patent from the origin to the skull base. The carotid bifurcation is widely patent. No high-grade stenosis or evidence of dissection. Left carotid system: The left carotid artery is patent from the origin to the skull base. The carotid bifurcation is widely patent. No evidence of high-grade stenosis or dissection. Vertebral arteries: The vertebral arteries are patent from the origins to the vertebrobasilar confluence. No high-grade stenosis or evidence of dissection. Other: None. MRA HEAD FINDINGS Anterior circulation: The intracranial arteries are patent from the skull base to the ICA termini. No significant stenosis, aneurysm, or vascular malformation. The right M1 segment and right MCA bifurcation are patent. Distal right MCA branches are patent. The left M1 segment and left MCA bifurcation are patent. Distal left MCA branches are patent. Diminutive A1 segment of the left ACA. Right A1 segment is patent. Normal  appearance of the anterior communicating artery. Distal ACA branches are patent bilaterally. Posterior circulation: The visualized intracranial  vertebral arteries are patent. The basilar artery is patent. Fetal origin of both PCAs. The bilateral PCAs are patent. Bilateral posterior communicating arteries patent. The bilateral superior cerebellar arteries are patent. Right PICA visualized. Anatomic variants: Fetal origin of the bilateral PCAs. Other: None. IMPRESSION: Normal MRA of the head and neck. No significant interval change compared to 09/09/2023. Electronically Signed   By: Emily Filbert M.D.   On: 10/02/2023 18:25   MR ANGIO HEAD WO CONTRAST Result Date: 10/02/2023 CLINICAL DATA:  Stroke follow-up, history of brain/CNS neoplasm, multiple infarcts on recent prior MRIs. EXAM: MRA NECK WITHOUT AND WITH CONTRAST MRA HEAD WITHOUT CONTRAST TECHNIQUE: Multiplanar and multiecho pulse sequences of the neck were obtained without and with intravenous contrast. Angiographic images of the neck were obtained using MRA technique without and with intravenous contrast; Angiographic images of the Circle of Willis were obtained using MRA technique without intravenous contrast. CONTRAST:  6mL GADAVIST GADOBUTROL 1 MMOL/ML IV SOLN COMPARISON:  Same day MRI head, additional MRI head 09/15/2023 and earlier. MRA head and neck 09/09/2023. FINDINGS: MRA NECK FINDINGS Aortic arch: Visualized portions of the aortic arch within normal limits. Three vessel configuration of the aortic arch. No significant stenosis at the origins of the brachiocephalic and left common carotid arteries. Left subclavian artery origin is not well visualized. Right carotid system: The right carotid artery is patent from the origin to the skull base. The carotid bifurcation is widely patent. No high-grade stenosis or evidence of dissection. Left carotid system: The left carotid artery is patent from the origin to the skull base. The carotid bifurcation is  widely patent. No evidence of high-grade stenosis or dissection. Vertebral arteries: The vertebral arteries are patent from the origins to the vertebrobasilar confluence. No high-grade stenosis or evidence of dissection. Other: None. MRA HEAD FINDINGS Anterior circulation: The intracranial arteries are patent from the skull base to the ICA termini. No significant stenosis, aneurysm, or vascular malformation. The right M1 segment and right MCA bifurcation are patent. Distal right MCA branches are patent. The left M1 segment and left MCA bifurcation are patent. Distal left MCA branches are patent. Diminutive A1 segment of the left ACA. Right A1 segment is patent. Normal appearance of the anterior communicating artery. Distal ACA branches are patent bilaterally. Posterior circulation: The visualized intracranial vertebral arteries are patent. The basilar artery is patent. Fetal origin of both PCAs. The bilateral PCAs are patent. Bilateral posterior communicating arteries patent. The bilateral superior cerebellar arteries are patent. Right PICA visualized. Anatomic variants: Fetal origin of the bilateral PCAs. Other: None. IMPRESSION: Normal MRA of the head and neck. No significant interval change compared to 09/09/2023. Electronically Signed   By: Emily Filbert M.D.   On: 10/02/2023 18:25   MR BRAIN W WO CONTRAST Result Date: 10/02/2023 CLINICAL DATA:  Brain/CNS neoplasm, assessment of treatment response. History of oligodendroglioma. Recent infarcts. EXAM: MRI HEAD WITHOUT AND WITH CONTRAST TECHNIQUE: Multiplanar, multiecho pulse sequences of the brain and surrounding structures were obtained without and with intravenous contrast. CONTRAST:  6mL GADAVIST GADOBUTROL 1 MMOL/ML IV SOLN COMPARISON:  MRI head 09/15/2023 and earlier. FINDINGS: Brain: Similar appearance of 8 mm focus of diffusion signal abnormality in the right cerebral peduncle. Associated enhancement appears decreased compared to the prior study.  Similar small focus of diffusion signal abnormality involving the genu of the corpus callosum. There is a new punctate focus of restricted diffusion involving the body of the right hippocampus. Similar appearance of multifocal signal abnormality in the right frontal and  right temporal lobes compatible with known intracranial tumor. Associated susceptibility and intrinsic T1 hyperintensity within this region is similar to prior and may reflect post treatment changes versus chronic blood products. There is no evidence of definite enhancement within this region separate from the regions of intrinsic T1 signal abnormality. Additional FLAIR signal abnormality within the right frontotemporal lobes also involving the right insula and right periventricular white matter. Areas of cystic encephalomalacia similar to prior. Interval resolution of diffusion signal abnormality in the mesial left temporal lobe. Faint associated focus of enhancement in the mesial left temporal lobe is decreased compared to 09/15/2023 (series 20 image 59). Resolution of diffusion signal abnormality in the right insula. Decreased enhancement in the right insula compared to 09/15/2023 (series 20, image 75). No new areas of intracranial hemorrhage. Similar parenchymal volume loss and mild chronic microvascular ischemic changes. Remote lacunar infarcts in the pons. The basilar cisterns are patent. Ventricles: Similar ex vacuo dilatation of the right lateral ventricle. Vascular: Skull base flow voids are visualized. Skull and upper cervical spine: No focal abnormality. Sinuses/Orbits: Orbits are symmetric. Mucosal thickening in the left ethmoid and maxillary sinuses. Other: Trace fluid in the left mastoid tip. IMPRESSION: New punctate focus of restricted diffusion in the body of the right hippocampus without associated enhancement. Finding could reflect focus of acute infarct. This appearance could also reflect transient global amnesia in the appropriate  clinical context. Persistent 8 mm focus of diffusion signal abnormality in the right cerebral peduncle. Apparent decreased enhancement in this region may be due to differences in technique. Findings remain concerning for infiltrative tumor. Recommend continued imaging follow-up. Persistent focus of diffusion signal abnormality in the genu of the corpus callosum without associated enhancement. Focal infiltrative tumor cannot be excluded. Resolution of diffusion signal abnormality within the mesial left temporal lobe and right insula with decreased associated enhancement. Findings likely reflect resolving late subacute infarcts. Similar appearance of multifocal signal abnormality in the right frontal and right temporal lobes compatible with tumor. Electronically Signed   By: Denny Flack M.D.   On: 10/02/2023 18:06   DG Swallowing Func-Speech Pathology Result Date: 09/16/2023 Table formatting from the original result was not included. Modified Barium Swallow Study Patient Details Name: Alan Wise MRN: [id number] Date of Birth: 1971-02-04 Today's Date: 09/16/2023 HPI/PMH: HPI: 53 yr old presenting 3/24 with difficulty walking and swallowing. MRI brain revealed new punctate focus of restricted diffusion in the anterior insular cortex compatible with new acute/subacute infarct. [facility] admission [facility] ED 3/19-3/21 with AMS and new CVA. 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 Wife says aphasia initiated a year ago slowely decling but feels he is at his baseline with aphasia and is receiving outpatient ST per previous SLP note. BSE 09/10/23 initial cough with liquids not repeated with additional trials, complete mastication and clearance with regular textures. Recommmende regular/thin Clinical Impression: Clinical Impression: Pt demonstrates pharyngoesophageal and suspected esophageal dysphagia with DIGEST score of 2  (moderate impairment). Pt's pharyngoesophageal segment closes prematurely causing consistent backflow into his pyriform sinuses throughout the study and esophagus appeared to remain open after the swallow on several occasions. He had residue in his proximal esophagus with thicker consistencies in addition to backflow possibly explaining sudden coughing during meals. A Mendelsohn maneuver was attempted to prolong PES opening however he was unable to adequately perform. Material in the esophagus ascended several times with mild amount reaching pharynx. At the end of the study with thin  barium there was penetration to the vocal cords that cleared however when fluoro turned back on aspiration was observed and uncertain if from pyriform sinus residue or from possible reflux. Min-mild pyriform sinus residue consistently present not completely cleared with second swallows. Mastication of regular texture was timely without residual. Recommend upgrade to regular and educated wife to order textures he can masticate easier as he does have decreased sensation on left side oral cavity. Continue thin liquids, meds whole in applesauce, pt prefers cups over straws, alternate liquids and solids and place food on right side of oral cavity. ST will continue to follow and trial exercises to promote PES opening. Pt may benefit from further esophageal assessment with GI consult. Factors that may increase risk of adverse event in presence of aspiration Rubye Oaks & Clearance Coots 2021): No data recorded Recommendations/Plan: Swallowing Evaluation Recommendations Swallowing Evaluation Recommendations Recommendations: PO diet PO Diet Recommendation: Regular; Thin liquids (Level 0) Liquid Administration via: Cup (pt prefers cup over straw) Medication Administration: Whole meds with puree Supervision: Patient able to self-feed; Full supervision/cueing for swallowing strategies Swallowing strategies  : Slow rate; Small bites/sips; Follow solids with  liquids; Check for pocketing or oral holding Postural changes: Position pt fully upright for meals; Stay upright 30-60 min after meals Oral care recommendations: Oral care BID (2x/day) Recommended consults: Consider GI consultation; Consider esophageal assessment Treatment Plan Treatment Plan Treatment recommendations: Therapy as outlined in treatment plan below Follow-up recommendations: -- (TBD) Functional status assessment: Patient has had a recent decline in their functional status and demonstrates the ability to make significant improvements in function in a reasonable and predictable amount of time. Treatment frequency: Min 2x/week Treatment duration: 2 weeks Interventions: Diet toleration management by SLP; Patient/family education; Compensatory techniques; Other (comment) (trial of Mendelsohn and Shaker) Recommendations Recommendations for follow up therapy are one component of a multi-disciplinary discharge planning process, led by the attending physician.  Recommendations may be updated based on patient status, additional functional criteria and insurance authorization. Assessment: Orofacial Exam: Orofacial Exam Oral Cavity: Oral Hygiene: WFL Oral Cavity - Dentition: Adequate natural dentition Orofacial Anatomy: WFL Oral Motor/Sensory Function: Suspected cranial nerve impairment CN VII - Facial: Left motor impairment Anatomy: Anatomy: Prominent cricopharyngeus Boluses Administered: Boluses Administered Boluses Administered: Thin liquids (Level 0); Mildly thick liquids (Level 2, nectar thick); Moderately thick liquids (Level 3, honey thick); Puree; Solid  Oral Impairment Domain: Oral Impairment Domain Lip Closure: No labial escape Tongue control during bolus hold: Cohesive bolus between tongue to palatal seal Bolus preparation/mastication: Timely and efficient chewing and mashing Bolus transport/lingual motion: Brisk tongue motion Initiation of pharyngeal swallow : Pyriform sinuses  Pharyngeal Impairment  Domain: Pharyngeal Impairment Domain Soft palate elevation: No bolus between soft palate (SP)/pharyngeal wall (PW) Laryngeal elevation: Complete superior movement of thyroid cartilage with complete approximation of arytenoids to epiglottic petiole Anterior hyoid excursion: Complete anterior movement Epiglottic movement: Complete inversion Laryngeal vestibule closure: Incomplete, narrow column air/contrast in laryngeal vestibule Pharyngeal stripping wave : Present - complete Pharyngeal contraction (A/P view only): N/A Pharyngoesophageal segment opening: Partial distention/partial duration, partial obstruction of flow Tongue base retraction: No contrast between tongue base and posterior pharyngeal wall (PPW) Pharyngeal residue: Collection of residue within or on pharyngeal structures Location of pharyngeal residue: Pyriform sinuses  Esophageal Impairment Domain: Esophageal Impairment Domain Esophageal clearance upright position: Esophageal retention with retrograde flow through the PES Pill: No data recorded Penetration/Aspiration Scale Score: Penetration/Aspiration Scale Score 1.  Material does not enter airway: Solid; Puree; Moderately thick liquids (Level 3, honey  thick); Mildly thick liquids (Level 2, nectar thick) 5.  Material enters airway, CONTACTS cords and not ejected out: Thin liquids (Level 0) 8.  Material enters airway, passes BELOW cords without attempt by patient to eject out (silent aspiration) : Thin liquids (Level 0) Compensatory Strategies: Compensatory Strategies Compensatory strategies: Yes Effortful swallow: Ineffective Ineffective Effortful Swallow: Puree Left head turn: Ineffective Ineffective Left Head Turn: Thin liquid (Level 0) Mendelsohn : Effective Effective Mendelsohn: Thin liquid (Level 0)   General Information: Caregiver present: No  Diet Prior to this Study: Dysphagia 3 (mechanical soft); Thin liquids (Level 0)   Temperature : Normal   Respiratory Status: WFL   Supplemental O2: None  (Room air)   History of Recent Intubation: No  Behavior/Cognition: Alert; Cooperative; Pleasant mood Self-Feeding Abilities: Able to self-feed Baseline vocal quality/speech: Normal Volitional Cough: Able to elicit No data recorded Exam Limitations: No limitations Goal Planning: Prognosis for improved oropharyngeal function: -- (fair-good) Barriers to Reach Goals: Other (Comment) (pt has aphasia) No data recorded No data recorded Consulted and agree with results and recommendations: Patient; Family member/caregiver; Physician Pain: Pain Assessment Pain Assessment: Faces Faces Pain Scale: 0 End of Session: Start Time:SLP Start Time (ACUTE ONLY): 1208 Stop Time: SLP Stop Time (ACUTE ONLY): 1228 Time Calculation:SLP Time Calculation (min) (ACUTE ONLY): 20 min Charges: SLP Evaluations $ SLP Speech Visit: 1 Visit SLP Evaluations $BSS Swallow: 1 Procedure $MBS Swallow: 1 Procedure SLP visit diagnosis: SLP Visit Diagnosis: Dysphagia, pharyngoesophageal phase (R13.14) Past Medical History: Past Medical History: Diagnosis Date  Allergy   Complication of anesthesia   pt reports he is starting to have more difficulty arousing after surgery  Diabetes mellitus (HCC)   GERD (gastroesophageal reflux disease)   Headache   History of kidney stones   Hyperlipidemia   Renal disorder   kidney stones Past Surgical History: Past Surgical History: Procedure Laterality Date  APPLICATION OF CRANIAL NAVIGATION Right 01/17/2021  Procedure: APPLICATION OF CRANIAL NAVIGATION;  Surgeon: Bedelia Person, MD;  Location: [facility] OR;  Service: Neurosurgery;  Laterality: Right;  COLONOSCOPY  09/03/1994  COLONOSCOPY WITH PROPOFOL N/A 01/07/2023  Procedure: COLONOSCOPY WITH PROPOFOL;  Surgeon: [name], MD;  Location: [facility] ENDOSCOPY;  Service: Endoscopy;  Laterality: N/A;  NOT TOO EARLY  CYSTOSCOPY WITH STENT PLACEMENT Right 01/25/2016  Procedure: CYSTOSCOPY WITH STENT PLACEMENT;  Surgeon: Hildred Laser, MD;  Location: [facility] ORS;  Service: Urology;   Laterality: Right;  FRAMELESS  BIOPSY WITH BRAINLAB Right 01/17/2021  Procedure: RIGHT STEREOTACTIC BIOPSY OF INSULAR LESION;  Surgeon: Bedelia Person, MD;  Location: [facility] OR;  Service: Neurosurgery;  Laterality: Right;  KNEE ARTHROSCOPY W/ MENISCAL REPAIR Right 06/23/1988  POLYPECTOMY  01/07/2023  Procedure: POLYPECTOMY;  Surgeon: [name], MD;  Location: [facility] ENDOSCOPY;  Service: Endoscopy;;  removal of birthmark  06/08/2013  SKIN CANCER EXCISION  06/23/2012  TYMPANOSTOMY TUBE PLACEMENT  08/06/1978  URETEROSCOPY WITH HOLMIUM LASER LITHOTRIPSY Right 01/25/2016  Procedure: URETEROSCOPY WITH HOLMIUM LASER LITHOTRIPSY;  Surgeon: Hildred Laser, MD;  Location: [facility] ORS;  Service: Urology;  Laterality: Right;  URETHRAL STRICTURE DILATATION    visual inspection of vocal cord    1973  WISDOM TOOTH EXTRACTION   [name] 09/16/2023, 3:36 PM  MR BRAIN W CONTRAST Result Date: 09/15/2023 CLINICAL DATA:  53 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 EXAM: MRI HEAD WITH CONTRAST TECHNIQUE: Multiplanar, multiecho pulse sequences of the brain and surrounding structures were obtained with  intravenous contrast. CONTRAST:  7mL GADAVIST GADOBUTROL 1 MMOL/ML IV SOLN COMPARISON:  Noncontrast brain MRI yesterday and earlier FINDINGS: Heterogeneous enhancement of the chronic infiltrative right frontal lobe predominant tumor is stable compared to 09/07/2023. There remains regional ex vacuo enlargement of the right lateral ventricle. The punctate site of right insula enhancement on 09/07/2023 is now enhancing on series 6, image 26. And similar enhancement of the punctate left mesial temporal lobe diffusion restricted lesion seen at that time. Both of those areas have faded on DWI yesterday. However, ongoing and intense abnormal diffusion at the right cerebral peduncle (yesterday), not significantly changed over this  series of exams, and with questionable petechial enhancement there now on this exam series 6, image 20. No other new areas of enhancement are identified. The major dural venous sinuses are enhancing and appear to be patent. IMPRESSION: Constellation of findings over this series of multiple MRIs without and with contrast since 09/07/2023 suspicious for: - infiltrative tumor progression into the right cerebral peduncle. - superimposed scattered acute and subacute lacunar infarcts, with several from 09/07/2023 now faded on diffusion and with discrete post-ischemic appearing enhancement. Recommend continued MRI surveillance without and with contrast ( 4-6 weeks suggested time frame) to further evaluate the evolution of the above. Electronically Signed   By: Odessa Fleming M.D.   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 EEG to evaluate for seizure Level of alertness: Awake, asleep AEDs during EEG study: LCM Technical aspects: This EEG study was done with scalp electrodes positioned according to the 10-20 International system of electrode placement. Electrical activity was reviewed with band pass filter of 1-70Hz , sensitivity of 7 uV/mm, display speed of 59mm/sec with a 60Hz  notched filter applied as appropriate. EEG data were recorded continuously and digitally stored.  Video monitoring was available and reviewed as appropriate. Description: The posterior dominant rhythm consists of 8-9 Hz activity of moderate voltage  (25-35 uV) seen predominantly in posterior head regions, asymmetric ( right<right )  and reactive to eye opening and eye closing. Sleep was characterized by vertex waves, sleep spindles (12 to 14 Hz), maximal frontocentral region. EEG showed continuous 3 to 6 Hz theta-delta slowing in right hemisphere, maximal right temporal region. Hyperventilation and photic stimulation were not performed.   Event button was pressed on 09/15/2023 at around 1015 and 1234 for right leg shaking and altered mental status.  Concomitant EEG before, during and after the event did not show any EEG changes suggest seizure.  ABNORMALITY - Continuous slow, right hemisphere, maximal right temporal region  IMPRESSION: This study is suggestive of cortical dysfunction arising from ight hemisphere, maximal right temporal region likely secondary to underlying structural abnormality. No seizures or epileptiform discharges were seen throughout the recording. Event button was pressed on 09/15/2023 at around 1015 and 1234 for right leg shaking and altered mental status without concomitant EEG change.  This could have been a nonepileptic event.  However focal motor seizures may not be seen on scalp EEG.  Therefore clinical correlation is recommended.  Alan Wise   MR  BRAIN WO CONTRAST Result Date: 09/14/2023 CLINICAL DATA:  Neuro deficit, acute, stroke suspected. Progressive left-sided weakness. Progressive dysphasia. EXAM: MRI HEAD WITHOUT CONTRAST TECHNIQUE: Multiplanar, multiecho pulse sequences of the brain and surrounding structures were obtained without intravenous contrast. COMPARISON:  MR head 09/09/2023. FINDINGS: Brain: The diffusion-weighted images redemonstrate an 8 mm acute/subacute infarct involving the right cerebral peduncle. Previously noted restricted diffusion in the left hippocampus, potentially related to seizure activity has resolved. A new punctate focus of restricted diffusion is present in the anterior insular cortex.  Restricted diffusion in the anterior right frontal lobe and along the atrium of the right lateral ventricle is similar the prior study. Diffusion changes within the genu of the corpus callosum have improved. No new hemorrhage is present. No new foci of restricted diffusion are present. Periventricular T2 hyperintensities in the left hemisphere are stable. Chronic encephalomalacia of the right frontal operculum is stable. White matter changes extend along the right cerebral peduncle into the pons. The brainstem and cerebellum are otherwise within normal limits. Ex vacuo dilation of the right lateral ventricle is noted. No significant extraaxial fluid collection is present. The internal auditory canals are within normal limits. Vascular: Insert normal flow Skull and upper cervical spine: The craniocervical junction is normal. Upper cervical spine is within normal limits. Marrow signal is unremarkable. Sinuses/Orbits: The paranasal sinuses and mastoid air cells are clear. Bilateral lens replacements are noted. Globes and orbits are otherwise unremarkable. Other: IMPRESSION: 1. New punctate focus of restricted diffusion in the anterior insular cortex compatible with a new acute/subacute infarct. 2. 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 Tumor is considered less likely. 3. Stable restricted diffusion in the anterior right frontal lobe and along the atrium of the right lateral ventricle consistent with treated tumor. 4. Improved diffusion changes within the genu of the corpus callosum suggesting these were ischemic. 5. Stable chronic encephalomalacia of the right frontal operculum. 6. Stable periventricular T2 hyperintensities in the left hemisphere. This likely reflects the sequela of chronic microvascular ischemia. Electronically Signed   By: Marin Roberts M.D.   On: 09/14/2023 15:32   ECHOCARDIOGRAM COMPLETE Result Date: 09/10/2023    ECHOCARDIOGRAM REPORT   Patient Name:    Alan Wise Date of Exam: 09/10/2023 Medical Rec #:  [id number]         Height:       68.0 in Accession #:    [id number]        Weight:       148.1 lb Date of Birth:  Oct 25, 1970        BSA:          1.799 m Patient Age:    52 years          BP:           138/81 mmHg Patient Gender: M                 HR:           55 bpm. Exam Location:  [facility] Procedure: 2D Echo, Cardiac Doppler, Color Doppler and Saline Contrast Bubble            Study (Both Spectral and Color Flow Doppler were utilized during            procedure). Indications:     Stroke I63.9  History:         Patient has prior history of Echocardiogram examinations, most  recent 01/15/2022. Risk Factors:Diabetes and Dyslipidemia.  Sonographer:     Cristela Blue Referring Phys:  [id number] AMY N COX Diagnosing Phys: Julien Nordmann MD  Sonographer Comments: No parasternal window and no subcostal window. IMPRESSIONS  1. Left ventricular ejection fraction, by estimation, is 55 to 60%. The left ventricle has normal function. The left ventricle has no regional wall motion abnormalities. Left ventricular diastolic parameters are consistent with Grade I diastolic dysfunction (impaired relaxation).  2. Right ventricular systolic function is normal. The right ventricular size is normal. There is normal pulmonary artery systolic pressure. The estimated right ventricular systolic pressure is 25.8 mmHg.  3. The mitral valve is normal in structure. No evidence of mitral valve regurgitation. No evidence of mitral stenosis.  4. The aortic valve is normal in structure. Aortic valve regurgitation is not visualized. No aortic stenosis is present.  5. The inferior vena cava is normal in size with greater than 50% respiratory variability, suggesting right atrial pressure of 3 mmHg.  6. Agitated saline contrast bubble study was negative, with no evidence of any interatrial shunt. FINDINGS  Left Ventricle: Left ventricular ejection fraction, by estimation, is 55 to 60%.  The left ventricle has normal function. The left ventricle has no regional wall motion abnormalities. Strain was performed and the global longitudinal strain is indeterminate. The left ventricular internal cavity size was normal in size. There is no left ventricular hypertrophy. Left ventricular diastolic parameters are consistent with Grade I diastolic dysfunction (impaired relaxation). Right Ventricle: The right ventricular size is normal. No increase in right ventricular wall thickness. Right ventricular systolic function is normal. There is normal pulmonary artery systolic pressure. The tricuspid regurgitant velocity is 2.28 m/s, and  with an assumed right atrial pressure of 5 mmHg, the estimated right ventricular systolic pressure is 25.8 mmHg. Left Atrium: Left atrial size was normal in size. Right Atrium: Right atrial size was normal in size. Pericardium: There is no evidence of pericardial effusion. Mitral Valve: The mitral valve is normal in structure. No evidence of mitral valve regurgitation. No evidence of mitral valve stenosis. MV peak gradient, 2.2 mmHg. The mean mitral valve gradient is 1.0 mmHg. Tricuspid Valve: The tricuspid valve is normal in structure. Tricuspid valve regurgitation is not demonstrated. No evidence of tricuspid stenosis. Aortic Valve: The aortic valve is normal in structure. Aortic valve regurgitation is not visualized. No aortic stenosis is present. Aortic valve mean gradient measures 2.0 mmHg. Aortic valve peak gradient measures 4.0 mmHg. Aortic valve area, by VTI measures 3.01 cm. Pulmonic Valve: The pulmonic valve was normal in structure. Pulmonic valve regurgitation is not visualized. No evidence of pulmonic stenosis. Aorta: The aortic root is normal in size and structure. Venous: The inferior vena cava is normal in size with greater than 50% respiratory variability, suggesting right atrial pressure of 3 mmHg. IAS/Shunts: No atrial level shunt detected by color flow Doppler.  Agitated saline contrast was given intravenously to evaluate for intracardiac shunting. Agitated saline contrast bubble study was negative, with no evidence of any interatrial shunt. There  is no evidence of a patent foramen ovale. There is no evidence of an atrial septal defect. Additional Comments: 3D was performed not requiring image post processing on an independent workstation and was indeterminate.  LEFT VENTRICLE PLAX 2D LVIDd:         4.10 cm   Diastology LVIDs:         2.60 cm   LV e' medial:    5.33 cm/s LV PW:  1.20 cm   LV E/e' medial:  11.8 LV IVS:        1.10 cm   LV e' lateral:   6.85 cm/s LVOT diam:     2.00 cm   LV E/e' lateral: 9.2 LV SV:         52 LV SV Index:   29 LVOT Area:     3.14 cm  RIGHT VENTRICLE RV Basal diam:  2.10 cm RV Mid diam:    2.00 cm RV S prime:     11.60 cm/s TAPSE (M-mode): 1.6 cm LEFT ATRIUM           Index        RIGHT ATRIUM          Index LA diam:      3.30 cm 1.83 cm/m   RA Area:     7.43 cm LA Vol (A2C): 21.5 ml 11.95 ml/m  RA Volume:   11.30 ml 6.28 ml/m LA Vol (A4C): 13.1 ml 7.28 ml/m  AORTIC VALVE AV Area (Vmax):    2.78 cm AV Area (Vmean):   2.94 cm AV Area (VTI):     3.01 cm AV Vmax:           100.00 cm/s AV Vmean:          59.800 cm/s AV VTI:            0.172 m AV Peak Grad:      4.0 mmHg AV Mean Grad:      2.0 mmHg LVOT Vmax:         88.60 cm/s LVOT Vmean:        56.000 cm/s LVOT VTI:          0.165 m LVOT/AV VTI ratio: 0.96  AORTA Ao Root diam: 2.60 cm MITRAL VALVE               TRICUSPID VALVE MV Area (PHT): 3.58 cm    TR Peak grad:   20.8 mmHg MV Area VTI:   2.50 cm    TR Vmax:        228.00 cm/s MV Peak grad:  2.2 mmHg MV Mean grad:  1.0 mmHg    SHUNTS MV Vmax:       0.75 m/s    Systemic VTI:  0.16 m MV Vmean:      49.3 cm/s   Systemic Diam: 2.00 cm MV Decel Time: 212 msec MV E velocity: 62.90 cm/s MV A velocity: 75.50 cm/s MV E/A ratio:  0.83 Belva Boyden MD Electronically signed by Belva Boyden MD Signature Date/Time: 09/10/2023/11:37:44  AM    Final    MR BRAIN WO CONTRAST Result Date: 09/10/2023 CLINICAL DATA:  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 EXAM: MRI HEAD WITHOUT CONTRAST TECHNIQUE: Multiplanar, multiecho pulse sequences of the brain and surrounding structures were obtained without intravenous contrast. COMPARISON:  Comparison made with CT from earlier the same day as well as multiple previous studies, including recent brain MRI from 09/07/2023 FINDINGS: Brain: Cerebral volume loss with mild chronic microvascular ischemic disease, stable. Few scattered superimposed remote lacunar infarcts noted about the pons. Previously noted areas of diffusion signal abnormality involving the anterior genu of the corpus callosum, right insula, and mesial left temporal lobe are slightly decreased in prominence from prior, likely reflecting small evolving subacute infarcts. No associated hemorrhage or mass effect. Additional 7 mm focus of diffusion signal abnormality at the right cerebral peduncle is relatively stable, and remains indeterminate, possibly reflecting  ischemic change and/or tumor involvement. No other new or interval areas of infarction. Multifocal signal abnormality involving the right frontal and temporal lobes, consistent with known brain tumor. Few scattered foci of associated cystic encephalomalacia with associated T2/FLAIR signal. Associated diffusion signal abnormality is relatively stable. Scattered areas of intrinsic precontrast T1 hyperintensity and susceptibility artifact are also not appreciably changed, likely reflecting post treatment changes and/or chronic blood products. No evidence for new intracranial hemorrhage by MRI. Previously noted hyperdensity on prior CT corresponds with these signal changes. Although these changes on CT from earlier today are new as compared to most recent CT from 05/13/2023, these changes not appreciably changed from intervening brain MRIs, with no evidence for new or  interval hemorrhage. No other mass lesion or mass effect. Stable ventricular size and morphology without hydrocephalus. No extra-axial fluid collection. Pituitary gland within normal limits. No other intrinsic temporal lobe abnormality. Vascular: Major intracranial vascular flow voids are maintained. Skull and upper cervical spine: Craniocervical junction within normal limits. Bone marrow signal intensity normal. No scalp soft tissue abnormality. Sinuses/Orbits: Globes and orbital soft tissues within normal limits. Mild mucosal thickening present about the ethmoidal air cells and maxillary sinuses, slightly worse on the left. No significant mastoid effusion. Other: None. IMPRESSION: 1. No acute intracranial abnormality. 2. Previously described subcentimeter areas of diffusion signal abnormality (seen on recent brain MRI from 09/07/2023) involving the anterior genu of the corpus callosum, right insula, and mesial left temporal lobe are slightly decreased in prominence from prior, likely reflecting evolving subacute infarcts. No associated hemorrhage or mass effect. No areas of new or interval infarction. 3. Additional 7 mm focus of diffusion signal abnormality at the right cerebral peduncle is relatively stable, and remains indeterminate, possibly reflecting ischemic change and/or tumor involvement. Attention at follow-up recommended. 4. Multifocal signal abnormality involving the right frontal and temporal lobes, consistent with known brain tumor. Overall appearance is not significantly changed, with no evidence for new or interval hemorrhage by MRI. 5. Underlying atrophy with chronic microvascular ischemic disease, with a few scattered remote lacunar infarcts about the pons. Electronically Signed   By: Virgia Griffins M.D.   On: 09/10/2023 00:51   CT HEAD WO CONTRAST ( ) Result Date: 09/09/2023 CLINICAL DATA:  History of oligodendroglioma, presenting with confusion and chest tightness. EXAM: CT HEAD  WITHOUT CONTRAST TECHNIQUE: Contiguous axial images were obtained from the base of the skull through the vertex without intravenous contrast. RADIATION DOSE REDUCTION: This exam was performed according to the departmental dose-optimization program which includes automated exposure control, adjustment of the mA and/or kV according to patient size and/or use of iterative reconstruction technique. COMPARISON:  May 13, 2023 FINDINGS: Brain: There is generalized cerebral atrophy with widening of the extra-axial spaces and ventricular dilatation. There are areas of decreased attenuation within the white matter tracts of the supratentorial brain, consistent with microvascular disease changes. Post treatment changes are again seen within the right frontal lobe. A mild-to-moderate amount of curvilinear increased attenuation (approximately 57.92 Hounsfield units) is also seen within this region, along the junction of the cortex and white matter. Mild involvement of the right parietal lobe is noted. There is no evidence of associated mass effect or midline shift. Vascular: Marked severity bilateral cavernous carotid artery calcification is noted. Skull: Normal. Negative for fracture or focal lesion. Sinuses/Orbits: There is marked severity left ethmoid sinus mucosal thickening. Other: None. IMPRESSION: 1. Post treatment changes within the right frontal lobe with a mild-to-moderate amount of curvilinear increased attenuation within this region,  as described above. Given the patient's history of oligodendroglioma, this may represent areas of post treatment mineralization and calcification. A small amount of acute hemorrhage cannot completely be excluded. MRI correlation is recommended. 2. Generalized cerebral atrophy and microvascular disease changes of the supratentorial brain. 3. Marked severity left ethmoid sinus disease. Electronically Signed   By: Virgle Grime M.D.   On: 09/09/2023 21:11   MR ANGIO NECK W WO  CONTRAST Result Date: 09/09/2023 CLINICAL DATA:  Follow-up examination for stroke. EXAM: MRA NECK WITHOUT AND WITH CONTRAST MRA HEAD WITHOUT CONTRAST TECHNIQUE: Multiplanar and multiecho pulse sequences of the neck were obtained without and with intravenous contrast. Angiographic images of the neck were obtained using MRA technique without and with intravenous contrast; Angiographic images of the Circle of Willis were obtained using MRA technique without intravenous contrast. CONTRAST:  6mL GADAVIST GADOBUTROL 1 MMOL/ML IV SOLN COMPARISON:  Comparison made with brain MRI from 09/07/2023. FINDINGS: MRA NECK FINDINGS Aortic arch: Visualized aortic arch within normal limits for caliber with standard 3 vessel morphology. No stenosis about the origin of the great vessels. Right carotid system: Right common and internal carotid arteries are patent with antegrade flow. No dissection or hemodynamically significant stenosis about the right carotid artery system. Left carotid system: Left common and internal carotid arteries are patent with antegrade flow. No dissection or hemodynamically significant stenosis about the left carotid artery system. Vertebral arteries: Both vertebral arteries arise from subclavian arteries. Vertebral arteries are patent with antegrade flow. No dissection or hemodynamically significant stenosis. Other: None. MRA HEAD FINDINGS Anterior circulation: Both internal carotid arteries are patent to the termini without stenosis or other abnormality. Right A1 segment patent. Left A1 hypoplastic and/or absent. Normal anterior communicating artery complex. Anterior cerebral arteries patent without stenosis. No M1 stenosis or occlusion. Distal MCA branches perfused and symmetric. Posterior circulation: Visualized V4 segments patent without stenosis. Right PICA patent. Left PICA not seen. Basilar patent without stenosis. Superior cerebral arteries patent bilaterally. Fetal type origin of the PCAs  bilaterally. Both PCAs patent to their distal aspects without significant stenosis. Anatomic variants: As above. Other: No intracranial aneurysm. IMPRESSION: Normal MRA of the head and neck. No large vessel occlusion, hemodynamically significant stenosis, or other acute vascular abnormality. Electronically Signed   By: Virgia Griffins M.D.   On: 09/09/2023 20:17   MR ANGIO HEAD WO CONTRAST Result Date: 09/09/2023 CLINICAL DATA:  Follow-up examination for stroke. EXAM: MRA NECK WITHOUT AND WITH CONTRAST MRA HEAD WITHOUT CONTRAST TECHNIQUE: Multiplanar and multiecho pulse sequences of the neck were obtained without and with intravenous contrast. Angiographic images of the neck were obtained using MRA technique without and with intravenous contrast; Angiographic images of the Circle of Willis were obtained using MRA technique without intravenous contrast. CONTRAST:  6mL GADAVIST GADOBUTROL 1 MMOL/ML IV SOLN COMPARISON:  Comparison made with brain MRI from 09/07/2023. FINDINGS: MRA NECK FINDINGS Aortic arch: Visualized aortic arch within normal limits for caliber with standard 3 vessel morphology. No stenosis about the origin of the great vessels. Right carotid system: Right common and internal carotid arteries are patent with antegrade flow. No dissection or hemodynamically significant stenosis about the right carotid artery system. Left carotid system: Left common and internal carotid arteries are patent with antegrade flow. No dissection or hemodynamically significant stenosis about the left carotid artery system. Vertebral arteries: Both vertebral arteries arise from subclavian arteries. Vertebral arteries are patent with antegrade flow. No dissection or hemodynamically significant stenosis. Other: None. MRA HEAD FINDINGS Anterior circulation: Both  internal carotid arteries are patent to the termini without stenosis or other abnormality. Right A1 segment patent. Left A1 hypoplastic and/or absent. Normal  anterior communicating artery complex. Anterior cerebral arteries patent without stenosis. No M1 stenosis or occlusion. Distal MCA branches perfused and symmetric. Posterior circulation: Visualized V4 segments patent without stenosis. Right PICA patent. Left PICA not seen. Basilar patent without stenosis. Superior cerebral arteries patent bilaterally. Fetal type origin of the PCAs bilaterally. Both PCAs patent to their distal aspects without significant stenosis. Anatomic variants: As above. Other: No intracranial aneurysm. IMPRESSION: Normal MRA of the head and neck. No large vessel occlusion, hemodynamically significant stenosis, or other acute vascular abnormality. Electronically Signed   By: Virgia Griffins M.D.   On: 09/09/2023 20:17   DG Chest Port 1 View Result Date: 09/09/2023 CLINICAL DATA:  [id number] Chest pain [id number] EXAM: PORTABLE CHEST 1 VIEW COMPARISON:  04/11/2023 FINDINGS: Bilateral lung fields are clear. Bilateral costophrenic angles are clear. Normal cardio-mediastinal silhouette. No acute osseous abnormalities. The soft tissues are within normal limits. IMPRESSION: No active disease. Electronically Signed   By: Beula Brunswick M.D.   On: 09/09/2023 15:09   MR BRAIN W WO CONTRAST Result Date: 09/07/2023 CLINICAL DATA:  Provided history: Oligodendroglioma. Brain/CNS neoplasm, assess treatment response. Additional history provided by the scanning technologist: Multiple falls, vision loss in left eye, aphasia. Avastin therapy. Restaging. EXAM: MRI HEAD WITHOUT AND WITH CONTRAST TECHNIQUE: Multiplanar, multiecho pulse sequences of the brain and surrounding structures were obtained without and with intravenous contrast. CONTRAST:  6mL GADAVIST GADOBUTROL 1 MMOL/ML IV SOLN COMPARISON:  Brain MRI 07/06/2023. FINDINGS: Brain: Mild generalized cerebral atrophy. 9 mm focus of restricted diffusion with associated T2 FLAIR hyperintense signal abnormality in the right cerebral peduncle (series 5, image  73). 5 mm focus of restricted diffusion with associated T2 FLAIR hyperintense signal abnormality in the anterior left temporal lobe (series 5, image 74). 3 mm focus of restricted diffusion within the callosal genu at midline (series 5, image 81). 5 mm focus of restricted diffusion within the right insula with associated T2 FLAIR hyperintense signal abnormality. These foci are new as compared to the brain MRI of 07/06/2023 and may reflect acute/early subacute infarcts. However, new sites of tumor cannot be excluded. There is a new 3 mm focus of enhancement in the right subinsular region (series 16, image 90). No other new or progressive foci of enhancement are identified. An irregular focus of restricted diffusion within the right retrolenticular/periatrial region has mildly increased in extent since the prior MRI. A region of restricted diffusion within the mid and anterior right frontal lobe (along the margins of the right lateral ventricle) has also slightly increased in extent (for instance, extending more medially within the body of the corpus callosum on series 10, image 25). T2 FLAIR hyperintense signal abnormality within the right cerebral hemisphere has not significantly changed, nor has T2 FLAIR hyperintense signal abnormality within the body/genu of corpus callosum and left frontal lobe periventricular white matter. Unchanged parenchymal swelling at the right insula. Superimposed foci of chronic blood products within the right frontal lobe have increased in extent. Redemonstrated focus of cystic encephalomalacia at the anterior right insula. Chronic lacunar infarcts again demonstrated within the left aspect of the pons. Mild multifocal T2 FLAIR hyperintense signal abnormality elsewhere within the cerebral white matter and pons, nonspecific but compatible chronic small vessel ischemic disease. No extra-axial fluid collection. No midline shift. Vascular: Maintained flow voids within the proximal large  arterial vessels. Skull and upper cervical  spine: No focal worrisome marrow lesion. Right frontoparietal burr hole. Sinuses/Orbits: No focal worrisome marrow lesion. Incompletely assessed cervical spondylosis. Other: No mass or acute finding within the imaged orbits. Mild mucosal thickening within the left ethmoid and left maxillary sinuses. MRi brain impression #1 called by telephone at the time of interpretation on 09/07/2023 at 7:28 pm to provider [facility] Mcguire Gasparyan , who verbally acknowledged these results. IMPRESSION: 1. Foci of restricted diffusion and T2 FLAIR hyperintense signal abnormality within the right cerebral peduncle, anterior left temporal lobe, callosal genu and right insula, as described and new from the prior brain MRI of 07/06/2023. These foci may reflect acute/early subacute infarcts. However, new sites of tumor cannot be excluded. A short-interval follow-up brain MRI is recommended. 2. Foci of restricted diffusion within the right retrolenticular/periatrial region and right frontal lobe have slightly increased in extent since the prior MRI. Additionally, there is a new 3 mm right subinsular focus of parenchymal enhancement. These findings may reflect evolving treatment changes. However, a short-interval follow-up brain MRI is recommended for close surveillance. 3. Pre-contrast T1 hyperintense chronic blood products within the right frontal lobe have increased in extent. 4. Otherwise no significant interval change. 5. 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 Electronically Signed   By: Bascom Lily D.O.   On: 09/07/2023 20:07    Assessment/Plan Low grade glioma of cerebrum (HCC) - Plan: MR BRAIN W WO CONTRAST  Radiation therapy induced brain necrosis - Plan: MR BRAIN W WO CONTRAST  Focal seizures (HCC) - Plan: MR BRAIN W WO CONTRAST  Cerebrovascular accident (CVA), unspecified mechanism (HCC) - Plan: MR BRAIN W WO  CONTRAST  Alan Wise is clinically stable or improved today.  MRI brain demonstrates mostly stable findings.  Some prior foci of DWI bright have resolved or receded, c/w infarcts.  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  There is a tiny focus within right hippocampus of uncertain etiology.    His last infusion was 2 months ago, we are encouraged by lack of rebound enhancement s/p avastin washout.  Will remain off avastin because of stroke as potential side effect, lack of anticoagulation.  MRA head and neck were normal.  Agreeable with continuation of daily ASA 81mg  for stroke prevention.    Lacmital will con't 150mg  BID.  Con't Propanolol 20mg  BID which may help with headache prevention as well as blood pressure.  Amlodipine 5mg  daily will be continued as well.  We ask that STERLING MONDO return to clinic in 2 months following next brain MRI, or sooner as needed.  All questions were answered. The patient knows to call the clinic with any problems, questions or concerns. No barriers to learning were detected.  The total time spent in the encounter was 40 minutes and more than 50% was on counseling and review of test results   Mamie Searles, MD Medical Director of Neuro-Oncology [facility] 10/05/23 12:01 PM

## 2023-10-05 NOTE — Telephone Encounter (Signed)
 Was filled 1 week ago by another provider with 2 additional refills on file at pharmacy.

## 2023-10-06 ENCOUNTER — Ambulatory Visit

## 2023-10-07 ENCOUNTER — Telehealth: Payer: Self-pay | Admitting: Internal Medicine

## 2023-10-07 NOTE — Telephone Encounter (Signed)
 Voicemail full, I was unable to leave a message.

## 2023-10-08 ENCOUNTER — Other Ambulatory Visit: Payer: Self-pay

## 2023-10-12 ENCOUNTER — Telehealth: Payer: Self-pay

## 2023-10-12 NOTE — Telephone Encounter (Signed)
 Trying the pt regarding his completed FMLA forms. VM was full. I was unable to leave a message. Was able to get in contact with his spouse , she request for his copy to be mail. No questions or concerns at this time.

## 2023-10-13 ENCOUNTER — Other Ambulatory Visit: Payer: Self-pay

## 2023-10-13 ENCOUNTER — Encounter: Payer: Self-pay | Admitting: Occupational Therapy

## 2023-10-13 ENCOUNTER — Ambulatory Visit

## 2023-10-13 ENCOUNTER — Ambulatory Visit: Attending: Physical Medicine and Rehabilitation | Admitting: Occupational Therapy

## 2023-10-13 DIAGNOSIS — M6281 Muscle weakness (generalized): Secondary | ICD-10-CM | POA: Insufficient documentation

## 2023-10-13 DIAGNOSIS — R29818 Other symptoms and signs involving the nervous system: Secondary | ICD-10-CM | POA: Diagnosis present

## 2023-10-13 DIAGNOSIS — R2689 Other abnormalities of gait and mobility: Secondary | ICD-10-CM | POA: Insufficient documentation

## 2023-10-13 DIAGNOSIS — R4701 Aphasia: Secondary | ICD-10-CM | POA: Diagnosis present

## 2023-10-13 DIAGNOSIS — R278 Other lack of coordination: Secondary | ICD-10-CM | POA: Diagnosis present

## 2023-10-13 DIAGNOSIS — I6300 Cerebral infarction due to thrombosis of unspecified precerebral artery: Secondary | ICD-10-CM | POA: Diagnosis present

## 2023-10-13 DIAGNOSIS — R41841 Cognitive communication deficit: Secondary | ICD-10-CM | POA: Diagnosis present

## 2023-10-13 DIAGNOSIS — R41842 Visuospatial deficit: Secondary | ICD-10-CM | POA: Diagnosis present

## 2023-10-13 DIAGNOSIS — R41844 Frontal lobe and executive function deficit: Secondary | ICD-10-CM | POA: Diagnosis present

## 2023-10-13 DIAGNOSIS — R208 Other disturbances of skin sensation: Secondary | ICD-10-CM | POA: Diagnosis present

## 2023-10-13 NOTE — Therapy (Unsigned)
 OUTPATIENT OCCUPATIONAL THERAPY NEURO EVALUATION  Patient Name: Alan Wise MRN: [id number] DOB:08-04-1970, 53 y.o., male Today's Date: 10/13/2023  PCP: Carollynn Cirri, NP REFERRING PROVIDER: Celia Coles, MD   END OF SESSION:  OT End of Session - 10/13/23 1104     Visit Number 1    Number of Visits 16    Date for OT Re-Evaluation 12/18/23    Authorization Type [employer] 2025 OOPM MET    Authorization Time Period Covered 100% VL:? Auth:?    OT Start Time 1105    OT Stop Time 1200    OT Time Calculation (min) 55 min    Equipment Utilized During Treatment Testing Material    Activity Tolerance Patient tolerated treatment well    Behavior During Therapy WFL for tasks assessed/performed             Past Medical History:  Diagnosis Date   Allergy    Complication of anesthesia    pt reports he is starting to have more difficulty arousing after surgery   Diabetes mellitus (HCC)    GERD (gastroesophageal reflux disease)    Headache    History of kidney stones    Hyperlipidemia    Renal disorder    kidney stones   Past Surgical History:  Procedure Laterality Date   APPLICATION OF CRANIAL NAVIGATION Right 01/17/2021   Procedure: APPLICATION OF CRANIAL NAVIGATION;  Surgeon: Van Gelinas, MD;  Location: [facility] OR;  Service: Neurosurgery;  Laterality: Right;   COLONOSCOPY  09/03/1994   COLONOSCOPY WITH PROPOFOL  N/A 01/07/2023   Procedure: COLONOSCOPY WITH PROPOFOL ;  Surgeon: Marnee Sink, MD;  Location: [facility] ENDOSCOPY;  Service: Endoscopy;  Laterality: N/A;  NOT TOO EARLY   CYSTOSCOPY WITH STENT PLACEMENT Right 01/25/2016   Procedure: CYSTOSCOPY WITH STENT PLACEMENT;  Surgeon: Bart Born, MD;  Location: [facility] ORS;  Service: Urology;  Laterality: Right;   FRAMELESS  BIOPSY WITH BRAINLAB Right 01/17/2021   Procedure: RIGHT STEREOTACTIC BIOPSY OF INSULAR LESION;  Surgeon: Van Gelinas, MD;  Location: [facility] OR;  Service: Neurosurgery;  Laterality: Right;    KNEE ARTHROSCOPY W/ MENISCAL REPAIR Right 06/23/1988   POLYPECTOMY  01/07/2023   Procedure: POLYPECTOMY;  Surgeon: Marnee Sink, MD;  Location: [facility] ENDOSCOPY;  Service: Endoscopy;;   removal of birthmark  06/08/2013   SKIN CANCER EXCISION  06/23/2012   TYMPANOSTOMY TUBE PLACEMENT  08/06/1978   URETEROSCOPY WITH HOLMIUM LASER LITHOTRIPSY Right 01/25/2016   Procedure: URETEROSCOPY WITH HOLMIUM LASER LITHOTRIPSY;  Surgeon: Bart Born, MD;  Location: [facility] ORS;  Service: Urology;  Laterality: Right;   URETHRAL STRICTURE DILATATION     visual inspection of vocal cord     1973   WISDOM TOOTH EXTRACTION     Patient Active Problem List   Diagnosis Date Noted   Post-ictal aphasia 09/22/2023   CVA (cerebral vascular accident) (HCC) 09/20/2023   Abnormal finding on GI tract imaging 09/16/2023   Cerebrovascular accident (CVA) (HCC) 09/15/2023   Dizziness 09/15/2023   Dysphagia 09/14/2023   Gait disorder 09/14/2023   Seizure disorder (HCC) 09/10/2023   Uncontrolled type 2 diabetes mellitus with hypoglycemia, with long-term current use of insulin  (HCC) 09/10/2023   Acute CVA (cerebrovascular accident) (HCC) 09/10/2023   History of CVA (cerebrovascular accident) 09/09/2023   Essential hypertension 09/09/2023   Status epilepticus (HCC) 05/14/2023   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/2021   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   Focal seizures (HCC) 12/31/2020  Type 2 diabetes mellitus with hyperglycemia (HCC) 06/12/2014   Hyperlipidemia associated with type 2 diabetes mellitus (HCC) 06/12/2014    ONSET DATE: Referral: 09/23/2023  Hospitalization 3/30 - 09/25/23  REFERRING DIAG: I63.9 (ICD-10-CM) - Cerebral infarction, unspecified R frontal oligodendroglioma  THERAPY DIAG:  Muscle weakness (generalized)  Other lack of coordination  Other disturbances of skin sensation  Other symptoms and signs involving the nervous  system  Visuospatial deficit  Frontal lobe and executive function deficit  Rationale for Evaluation and Treatment: Rehabilitation  SUBJECTIVE:   SUBJECTIVE STATEMENT: Pt reported that from his 'perception' he needs to work on L side awareness, vision on left side, strength and putting jacket on.  Following subjective data from PT visit just before OT today: Pt and wife reports 3 years ago, they started noticing seizure activity. Sep 2022 they began radiation and chemo and chemo continued for 1 year. Then they started noticing radiation necrosis in July 2023. They started treatment with Avastin . Pt had new CVA around 3/19. Pt was transferred to CIR from 09/20/23 to 09/25/23 for 6 days. Pt reports he had very mild dysphagia prior to stroke but it got worst after the stroke. Pt also reports that after his stroke his left lateral peripheral vision in L eye has gotten significantly worst. Pt has not driven since November 2024 due to a large seizure. Pt had 4 seizures so far (November, January, February and March). He is currently on 6 month driving restrictions. Wife reports he had both types of seizures: silent and grand mal which lasted 1 min to 20 min. Wife is currently stay home caregiver for him.    Pt has aphasia and requires wife's assistance for subjective interview.   Falls: since his stroke, he had 2 falls since he came home (one trying to put his pants on and second one trying to down the outside stairs while carrying bag of cat litter). Pt reports of intermittent near falls. Pt is not using any AD for walking.    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  Presented to the hospital 09/14/2023 with worsening gait left-sided  weakness/aphasia and new onset dysphagia.  Of note, he was recently hospitalized from 3-19 to 3-21 with new CVA and breakthrough seizures.  In the ED he was started on IV fluids and MRI of the brain showed new punctate focus of restricted diffusion in the anterior insular cortex compatible with new acute/subacute infarct, stable 8 mm acute/subacute infarct involving the right cerebral peduncle, stable restricted diffusion in the anterior frontal lobe and along the atrium of the right lateral ventricle consistent with treated tumor, improved diffusion changes within the genu of the corpus callosum suggesting these were ischemic, stable chronic encephalomalacia of the right frontal operculum, and stable periventricular T2 hyperintensities in the left hemisphere likely reflects the sequela of chronic microvascular ischemia. He was started on Eliquis , AEDs were adjusted and admitted with evaluation by neurology and Dr. Mark Sil with neuro-oncology, because of recent decline determined to be multifocal secondary to radiation necrosis, breakthrough seizures and multiple recent strokes.  Hospitalization complicated by dysphagia and GI determined to be neurologic.  Last known seizure 09/16/23, with subsequent increase nightly Lamictal .  Therapy evaluations completed due to patient decreased functional mobility was admitted for  a comprehensive rehab program.  PRECAUTIONS: Fall  WEIGHT BEARING RESTRICTIONS: No  PAIN:  Are you having pain? No  FALLS: Has patient fallen in last 6 months? Yes. Number of falls 3 - trying to put pants on, ladder before stroke, carrying cat litter and lost balance on steps  LIVING ENVIRONMENT:  Home Living/Prior Functioning Home Living: Private residence Living Arrangements: Spouse - [name], Children - daughter [name], son 5yo Available Help at Discharge: Family, Available 24 hours/day Type of Home: House Home Access: Stairs to enter (stone steps) Entrance Stairs-Number of Steps:  4 Entrance Stairs-Rails: Right, Left Home Layout: Two level, Able to live on main level with bedroom/bathroom (can live on main level) Alternate Level Stairs-Number of Steps: flight of steps to 2nd floor bedroom Alternate Level Stairs-Rails:  (L for first 5 and R on second 5) Bathroom Shower/Tub: Psychologist, counselling, Sport and exercise psychologist: Standard Bathroom Accessibility: Yes IADL History Homemaking Responsibilities: No Prior Function/Level of Independence: Independent with basic ADLs, Independent with transfers, Independent with gait, Independent with homemaking with ambulation Able to Take Stairs?: Yes Driving: No (not driving for several years due to seizures)  PLOF: Independent with basic ADLs and Independent with household mobility without device Prior interests: Wrestled, ball room dance, soccer, professor of mathematics at [employer]  PATIENT GOALS: Pt reported he needs to work on L side awareness, vision on left side, strength and putting jacket on.  OBJECTIVE:  Note: Objective measures were completed at Evaluation unless otherwise noted.  HAND DOMINANCE: Left  ADLs: Overall ADLs: Min assist to Mod Ind Transfers/ambulation related to ADLs: Mod Ind Eating: Pt switched to R hand for eating, wife helps cut food Grooming: Wife used clippers recently UB Dressing: Reports he struggles with button up shirt/jacket,  LB Dressing: Pt spouse report he fell with LB dressing in standing, wife confirms he can tie shoelaces Toileting: Ind Bathing: Mod I Tub Shower transfers: walk in shower with built in bench with B showerheads Equipment: none  IADLs: Shopping: wife Light housekeeping: wife Meal Prep: wife Community mobility: Supervision Medication management: Wife assists Financial management: Wife Handwriting: Increased time - due to aphasia (June 2024 aphasia, speech, reading)  MOBILITY STATUS: Hx of falls  POSTURE COMMENTS:  rounded shoulders and forward head Sitting balance:  WFL  ACTIVITY TOLERANCE: Activity tolerance: Fair  FUNCTIONAL OUTCOME MEASURES: TBA  UPPER EXTREMITY ROM:    Active ROM Right eval Left eval  Shoulder flexion 120 90  Shoulder abduction    Shoulder adduction    Shoulder extension    Shoulder internal rotation    Shoulder external rotation    Elbow flexion WNL WFL  Elbow extension WNL WFL  Wrist flexion  Sight limits  Wrist extension    Wrist ulnar deviation    Wrist radial deviation    Wrist pronation    Wrist supination    (Blank rows = not tested)  UPPER EXTREMITY MMT:     MMT Right eval Left eval  Shoulder flexion 4 3-  Shoulder abduction    Shoulder adduction    Shoulder extension    Shoulder internal rotation    Shoulder external rotation    Middle trapezius    Lower trapezius    Elbow flexion 5 4+  Elbow extension 4+ 4  Wrist flexion    Wrist extension    Wrist ulnar deviation    Wrist radial deviation    Wrist pronation    Wrist supination    (Blank rows = not tested)  HAND FUNCTION:  Grip strength: Right: 53.5, 57.5, 53.3  lbs; Left: 42.9, 51.8, 41.8  lbs Right: 54.8 lbs Left: 45.5 lbs  COORDINATION: 9 Hole Peg test: Right: 41.24  sec; Left: 1:04.00 sec  SENSATION: Patient initially reported normal but then noted he may have some limtations ie) does not know   EDEMA: NA  MUSCLE TONE: Some changes ie) not as smooth with movements and coordination  COGNITION: Overall cognitive status: Impaired/Aphasia impacts cognition Hospital: [facility] Summary Score: 12   VISION: Subjective report: January - vision was decreasing Baseline vision: Wears glasses for reading only about 1 year Visual history: changes were happening perhaps a precursor to CVA  MR review: Vision Baseline Vision/History: 1 Wears glasses Ability to See in Adequate Light: 1 Impaired Patient Visual Report: Peripheral vision impairment Vision Assessment?: Wears glasses for reading Eye Alignment: Within Functional  Limits Alignment/Gaze Preference: Within Defined Limits Visual Fields: Left homonymous hemianopsia Additional Comments: L peripheral vision decr since January  VISION ASSESSMENT: Tracking/Visual pursuits: Impaired - pt has trouble with vision on L side  Patient has difficulty with following activities due to following visual impairments: walks into walls, chairs etc, ie) doesn't always scan especially if he is tired, agitated, etc  PERCEPTION: Not tested  PRAXIS: Not tested                                                                                                                           TREATMENT DATE:   OTR discussed evaluation findings, impairments and goals and POC for occupational therapy with to address UE deficits and visual impairments.  Education provided on hemi-dressing techniques for UB dressing with jacket before leaving today: Guided pt step-by-step: Using the R hand to hold the jacket at the collar and open it up. Placing L arm through the sleeve first and pulling it up to the shoulder. Use R hand to pull the jacket behind his neck/body and adjust the fit. Able to use both hands to pull the jacket down and adjust it, finishing the process.    PATIENT EDUCATION: Education details: OT role, POC considerations and dressing recommendations Person educated: Patient and Spouse Education method: Explanation, Demonstration, Tactile cues, and Verbal cues Education comprehension: verbalized understanding, returned demonstration, verbal cues required, tactile cues required, and needs further education  HOME EXERCISE PROGRAM: TBD   GOALS: Goals reviewed with patient? Yes  SHORT TERM GOALS: Target date: 11/20/23  Patient will demonstrate initial UE HEP with 25% verbal cues or less for proper execution (d/t B shoulder limitations and L sided weakness s/p CVA). Baseline: New to outpt OT Goal status: INITIAL   2.  Patient will demonstrate at least 5+ lbs improvement for  BUE grip strength as needed to open jars and other containers. Baseline: Right: 54.8 lbs Left: 45.5 lbs Goal status: INITIAL   3.  Patient will demo improved FM coordination as evidenced by completing nine-hole peg 5+ seconds faster with use of BUE.  Baseline: 9 Hole Peg test: Right:  41.24  sec; Left: 1:04.00 sec Goal status: INITIAL   4.  Patient will independently recall at least 2 compensatory strategies for visual impairment without cueing. Baseline: New to outpt OT - bumps into furniture Goal status: INITIAL   5. Pt will verbalize understanding of modifications, strategies and/or equipment PRN with visual cues as needed to increase safety and independence with ADLs and IADLs (I.e. with decreased fall risk). Baseline: New to outpt OT - fell donning pants Goal Status: INITIAL   LONG TERM GOALS: Target date: 12/18/23   Patient will demonstrate updated UE HEP with visual handouts only for proper execution.  Baseline: New to outpt OT  Goal status: INITIAL   2.  Patient will return demonstration of visual adaptation use and verbalize understanding of how to obtain item(s) if desired.  Baseline: New to outpt OT Goal status: INITIAL   3.  Patient will demo improved FM coordination as evidenced by completing nine-hole peg with use of BUE as close to 30 seconds as possible.  Baseline: 9 Hole Peg test: Right: 41.24  sec; Left: 1:04.00 sec Goal status: INITIAL   4.  Pt will write/print his name/initials. Baseline: difficult due to aphasia by report Goal status: INITIAL   5.  Pt will report increased ease with donning all clothing items - shirt, socks/shoes without assistance of spouse Baseline: occasional assistance of spouse Goal status: INITIAL  ASSESSMENT:  CLINICAL IMPRESSION: Patient is a 53 y.o. male who was seen today for occupational therapy evaluation for UE and visual deficits s/p CVA. 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 Patient currently presents below baseline level of function with L visual field awareness and R UE coordination affecting ADLS, safety and functional mobility.  Pt would benefit from skilled OT services in the outpatient setting to work on impairments as noted below to help pt return to PLOF as able.    PERFORMANCE DEFICITS: in functional skills including ADLs, IADLs, coordination, dexterity, proprioception, sensation, tone, ROM, strength, pain, fascial restrictions, muscle spasms, flexibility, Fine motor control, Gross motor control, mobility, balance, body mechanics, endurance, decreased knowledge of precautions, decreased knowledge of use of DME, vision, and UE functional use, cognitive skills including attention, emotional, energy/drive, learn, memory, perception, problem solving, safety awareness, sequencing, temperament/personality, thought, and understand, and psychosocial skills including coping strategies, environmental adaptation, habits, interpersonal interactions, and routines and behaviors.   IMPAIRMENTS: are limiting patient from ADLs, IADLs, rest and sleep, education, work, play, leisure, and social participation.   CO-MORBIDITIES: has co-morbidities such as h/o frontal oligodendroglioma   that affects occupational performance. Patient will benefit from skilled OT to address above impairments and improve overall function.  MODIFICATION OR ASSISTANCE TO COMPLETE EVALUATION: Min-Moderate modification of tasks or assist with assess necessary to complete an evaluation.  OT OCCUPATIONAL PROFILE AND HISTORY: Detailed assessment: Review of records and additional review of physical, cognitive, psychosocial history related to current functional performance.  CLINICAL DECISION MAKING: Moderate - several treatment options, min-mod task modification necessary  REHAB POTENTIAL: Good  EVALUATION COMPLEXITY:  Moderate    PLAN:  OT FREQUENCY: 1-2x/week  OT DURATION: 8 weeks  PLANNED INTERVENTIONS: 97535 self care/ADL training, 04540 therapeutic exercise, 97530 therapeutic activity, 97112 neuromuscular re-education, 97140 manual therapy, balance training, functional mobility training, visual/perceptual remediation/compensation, psychosocial skills training, energy conservation, coping strategies training, patient/family education, and DME and/or AE instructions  RECOMMENDED OTHER SERVICES: NA - other therapies already scheduled at this  time  CONSULTED AND AGREED WITH PLAN OF CARE: Patient and family member/caregiver  PLAN FOR NEXT SESSION:  More thorough evaluation of vision  Visual scanning activities - encourage L side awareness (balloon + scanning) HEPs for UE ROM (sh) and strength (putty) and coordination ADL comp strategies   Zora Hires, OT 10/13/2023, 3:07 PM

## 2023-10-13 NOTE — Therapy (Signed)
 OUTPATIENT PHYSICAL THERAPY NEURO EVALUATION   Patient Name: Alan Wise MRN: [id number] DOB:1971/05/12, 53 y.o., male Today's Date: 10/13/2023   PCP: Helayne Lo, NP REFERRING PROVIDER: Celia Coles, MD  END OF SESSION:  PT End of Session - 10/13/23 1410     Visit Number 1    Number of Visits 13    Date for PT Re-Evaluation 12/08/23    PT Start Time 1025    PT Stop Time 1100    PT Time Calculation (min) 35 min    Equipment Utilized During Treatment Gait belt    Activity Tolerance Patient tolerated treatment well    Behavior During Therapy WFL for tasks assessed/performed             Past Medical History:  Diagnosis Date   Allergy    Complication of anesthesia    pt reports he is starting to have more difficulty arousing after surgery   Diabetes mellitus (HCC)    GERD (gastroesophageal reflux disease)    Headache    History of kidney stones    Hyperlipidemia    Renal disorder    kidney stones   Past Surgical History:  Procedure Laterality Date   APPLICATION OF CRANIAL NAVIGATION Right 01/17/2021   Procedure: APPLICATION OF CRANIAL NAVIGATION;  Surgeon: Van Gelinas, MD;  Location: [facility] OR;  Service: Neurosurgery;  Laterality: Right;   COLONOSCOPY  09/03/1994   COLONOSCOPY WITH PROPOFOL  N/A 01/07/2023   Procedure: COLONOSCOPY WITH PROPOFOL ;  Surgeon: Marnee Sink, MD;  Location: [facility] ENDOSCOPY;  Service: Endoscopy;  Laterality: N/A;  NOT TOO EARLY   CYSTOSCOPY WITH STENT PLACEMENT Right 01/25/2016   Procedure: CYSTOSCOPY WITH STENT PLACEMENT;  Surgeon: Bart Born, MD;  Location: [facility] ORS;  Service: Urology;  Laterality: Right;   FRAMELESS  BIOPSY WITH BRAINLAB Right 01/17/2021   Procedure: RIGHT STEREOTACTIC BIOPSY OF INSULAR LESION;  Surgeon: Van Gelinas, MD;  Location: [facility] OR;  Service: Neurosurgery;  Laterality: Right;   KNEE ARTHROSCOPY W/ MENISCAL REPAIR Right 06/23/1988   POLYPECTOMY  01/07/2023   Procedure: POLYPECTOMY;  Surgeon:  Marnee Sink, MD;  Location: [facility] ENDOSCOPY;  Service: Endoscopy;;   removal of birthmark  06/08/2013   SKIN CANCER EXCISION  06/23/2012   TYMPANOSTOMY TUBE PLACEMENT  08/06/1978   URETEROSCOPY WITH HOLMIUM LASER LITHOTRIPSY Right 01/25/2016   Procedure: URETEROSCOPY WITH HOLMIUM LASER LITHOTRIPSY;  Surgeon: Bart Born, MD;  Location: [facility] ORS;  Service: Urology;  Laterality: Right;   URETHRAL STRICTURE DILATATION     visual inspection of vocal cord     1973   WISDOM TOOTH EXTRACTION     Patient Active Problem List   Diagnosis Date Noted   Post-ictal aphasia 09/22/2023   CVA (cerebral vascular accident) (HCC) 09/20/2023   Abnormal finding on GI tract imaging 09/16/2023   Cerebrovascular accident (CVA) (HCC) 09/15/2023   Dizziness 09/15/2023   Dysphagia 09/14/2023   Gait disorder 09/14/2023   Seizure disorder (HCC) 09/10/2023   Uncontrolled type 2 diabetes mellitus with hypoglycemia, with long-term current use of insulin  (HCC) 09/10/2023   Acute CVA (cerebrovascular accident) (HCC) 09/10/2023   History of CVA (cerebrovascular accident) 09/09/2023   Essential hypertension 09/09/2023   Status epilepticus (HCC) 05/14/2023   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/2021   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   Focal seizures (HCC) 12/31/2020   Type 2 diabetes mellitus with hyperglycemia (HCC) 06/12/2014   Hyperlipidemia associated with type 2 diabetes mellitus (HCC)  06/12/2014    ONSET DATE: 08/23/23  REFERRING DIAG: I63.9 (ICD-10-CM) - Cerebral infarction, unspecified  THERAPY DIAG:  Muscle weakness (generalized)  Other abnormalities of gait and mobility  Rationale for Evaluation and Treatment: Rehabilitation  SUBJECTIVE:                                                                                                                                                                                             SUBJECTIVE  STATEMENT: Pt and wife reports 3 years ago, they started noticing seizure activity. Sep 2022 they began radiation and chemo and chemo continued for 1 year. Then they started noticing radiation necrosis in July 2023. They started treatment with Avastin . Pt had new CVA around 3/19. Pt was transferred to CIR from 09/20/23 to 09/25/23 for 6 days. Pt reports he had very mild dysphagia prior to stroke but it got worst after the stroke. Pt also reports that after his stroke his left lateral peripheral vision in L eye has gotten significantly worst. Pt has not driven since November 2024 due to a large seizure. Pt had 4 seizures so far (November, January, February and March). He is currently on 6 month driving restrictions. Wife reports he had both types of seizures: silent and grand mal which lasted 1 min to 20 min. Wife is currently stay home caregiver for him.   Pt has aphasia and requires wife's assistance for subjective interview.  Falls: since his stroke, he had 2 falls since he came home (one trying to put his pants on and second one trying to down the outside stairs while carrying bag of cat litter). Pt reports of intermittent near falls. Pt is not using any AD for walking.   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  Pt accompanied by: self and significant other  PERTINENT HISTORY: past medical history of Allergy, Complication of anesthesia, Diabetes mellitus (HCC), GERD (gastroesophageal reflux disease), Headache, History of kidney stones, Hyperlipidemia, and Renal disorder. 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  PAIN:  Are you having pain? No  PRECAUTIONS: Fall and Other: Seizures  RED FLAGS: None   WEIGHT BEARING RESTRICTIONS: No  FALLS: Has patient fallen in last 6 months? Yes. Number of falls 2  LIVING ENVIRONMENT: Lives with: lives with their spouse Lives in: House/apartment Stairs: Yes: Internal: 15 steps; rail  on L for 5 steps, landing, and 10 steps going up with rail on R and External: 3 steps; on right going up, on left going up, and bilateral but cannot reach both Has following equipment at home: None  PLOF: Independent  PATIENT GOALS: Improve walking, balance  OBJECTIVE:  Note: Objective measures were completed at Evaluation unless otherwise noted.  COGNITION: Overall cognitive status: Impaired   SENSATION: WFL   LOWER EXTREMITY ROM:     Active  Right Eval Left Eval  Hip flexion    Hip extension    Hip abduction    Hip adduction    Hip internal rotation    Hip external rotation    Knee flexion    Knee extension    Ankle dorsiflexion    Ankle plantarflexion    Ankle inversion    Ankle eversion     (Blank rows = not tested)  LOWER EXTREMITY MMT:    MMT Right Eval Left Eval  Hip flexion 5 4  Hip extension    Hip abduction    Hip adduction    Hip internal rotation    Hip external rotation    Knee flexion 5 4  Knee extension 5 5  Ankle dorsiflexion 5 4  Ankle plantarflexion    Ankle inversion    Ankle eversion    (Blank rows = not tested)  BED MOBILITY:  Findings: Sit to supine Complete Independence Supine to sit Complete Independence Rolling to Right Complete Independence Rolling to Left Complete Independence  TRANSFERS: Sit to stand: Complete Independence  Assistive device utilized: None     Stand to sit: Complete Independence  Assistive device utilized: None      STAIRS: Findings: Level of Assistance: SBA, Stair Negotiation Technique: Step to Pattern with Single Rail on Right, Number of Stairs: 4, Height of Stairs: 6"   , and Comments: reciprocal pattern when ascending and step to pattern when descending stairs. GAIT: Findings: Gait Characteristics: decreased arm swing- Right, decreased arm swing- Left, decreased step length- Right, decreased step length- Left, and poor foot clearance- Left, Distance walked: 230', Assistive device utilized:None, Level  of assistance: CGA  FUNCTIONAL TESTS:  5 times sit to stand: 34 sec without UE support (4/22) Timed up and go (TUG): 13 sec without AD (4/22) Functional gait assessment: 20/30                                                                                                                               TREATMENT DATE:  Sit to stand: pt and wife reports of occasional retropulsion, especially when getting off the couch. Pt educated on scooting to the end of the chair/couch before standing up to avoid retropulsion.  We discussed evaluation findings, impairments and goals and POC in therapy.    PATIENT EDUCATION: Education details: see above Person educated: Patient and Spouse Education method: Medical illustrator Education comprehension: verbalized understanding  HOME EXERCISE PROGRAM: TBD  GOALS: Goals reviewed with patient? Yes  SHORT TERM GOALS: Target date: 11/10/2023    Pt will demo >50% compliance with HEP to self manage symptoms. Baseline: TBD Goal status: INITIAL   LONG TERM GOALS: Target date: 12/08/2023  Patient will demo 5x sit to stand score of <16 sec to improve overall functional strength. Baseline: 34 s without UE support Goal status: INITIAL  2.  Pt will demo functional gait assessment score of >24/30 to improve overall balance with functional gait. Baseline: 20/30 Goal status: INITIAL  3.  Pt will be able to perform reciprocal gait pattern when descending stairs with use of one rail Baseline: step to pattern when descending (leading with L LE) with one rail (10/13/23) Goal status: INITIAL  4.  Patient will demo gait speed of >0.90 m/s to improve functional mobility in community. Baseline: TBD Goal status: INITIAL  5.  Pt will demo improved overall safety awareness and decision making to avoid risk for fall and report no falls during course of 4 weeks consecutively. Baseline:  Goal status: INITIAL   ASSESSMENT:  CLINICAL  IMPRESSION: Patient is a 53 y.o. male who was seen today for physical therapy evaluation and treatment for gait and mobility disorder after recent stroke. Patient has PMH including brain tumor, . past medical history of Allergy, Complication of anesthesia, Diabetes mellitus (HCC), GERD (gastroesophageal reflux disease), Headache, History of kidney stones, Hyperlipidemia, and Renal disorder. 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 Objective impairments include decreased gait and balance and overall mobility based on 5x sit to stand test, Functional gait assessment, and TUG. Patient will benefit from skilled PT to address these impairments and improve overall independence.   OBJECTIVE IMPAIRMENTS: Abnormal gait, decreased activity tolerance, decreased balance, decreased endurance, decreased knowledge of condition, decreased mobility, difficulty walking, decreased strength, decreased safety awareness, impaired perceived functional ability, impaired tone, impaired UE functional use, and impaired vision/preception.   ACTIVITY LIMITATIONS: carrying, lifting, bending, standing, squatting, stairs, transfers, and locomotion level  PARTICIPATION LIMITATIONS: meal prep, cleaning, laundry, driving, shopping, community activity, occupation, and yard work  PERSONAL FACTORS: Time since onset of injury/illness/exacerbation and 3+ comorbidities: brain tumor, CVA, seizures  are also affecting patient's functional outcome.   REHAB POTENTIAL: Good  CLINICAL DECISION MAKING: Stable/uncomplicated  EVALUATION COMPLEXITY: Low  PLAN:  PT FREQUENCY: 1-2x/week  PT DURATION: 8 weeks  PLANNED INTERVENTIONS: 97164- PT Re-evaluation, 97750- Physical Performance Testing, 97110-Therapeutic exercises, 97530- Therapeutic activity, [id number]- Neuromuscular re-education, 97535- Self Care, [id number]- Manual therapy, [phone]- Gait training, Patient/Family education, Balance training, Stair training, Joint  mobilization, Visual/preceptual remediation/compensation, DME instructions, Cryotherapy, and Moist heat  PLAN FOR NEXT SESSION: Assess gait speed and address LTG. HEP (sit to stand, tandem balance, SLS, head turns)   Kristine Phalen, PT 10/13/2023, 2:11 PM

## 2023-10-14 ENCOUNTER — Other Ambulatory Visit: Payer: Self-pay

## 2023-10-19 NOTE — Progress Notes (Unsigned)
 Electrophysiology Clinic Note    Date:  10/20/2023  Patient ID:  Alan Wise, Alan Wise 25-Mar-1971, MRN [id number] PCP:  Carollynn Cirri, NP  Cardiologist:  None Electrophysiologist: None   Discussed the use of AI scribe software for clinical note transcription with the patient, who gave verbal consent to proceed.   Patient Profile    Chief Complaint: stroke follow-up  History of Present Illness: Alan Wise is a 53 y.o. male with PMH notable for cryptogenic stroke, R frontal oligodendroglioma s/p radiation-induced necrosis, T2DM, seizure disorder, HTN; seen today for EP evaluation after stroke.    He was hospitalized 3/19-21 with subacute CVA and breakthrough seizures, and was re-hospitalized 3/24-30/2025 with increased gait difficulty, dysphagia, L-sided weakness and confusion.   Tele during hospitalization did not reveal AFib, All EKGs reviewed without Afib. He was prescribed a long-term monitor at discharge that has not been returned.   On follow-up today, he has not applied the ambulatory monitor. He continues to have significant difficulty with speech and communication. His wife joins for today's visit and assists. He denies chest pain, palpitations, dyspnea, PND, orthopnea, nausea, vomiting, dizziness, syncope, edema, weight gain, or early satiety.      Arrhythmia/Device History No specialty comments available.    ROS:  Please see the history of present illness. All other systems are reviewed and otherwise negative.    Physical Exam    VS:  BP (!) 150/80 (BP Location: Left Arm, Patient Position: Sitting, Cuff Size: Normal)   Pulse 65   Ht 5\' 8"  (1.727 m)   Wt 148 lb (67.1 kg)   SpO2 97%   BMI 22.50 kg/m  BMI: Body mass index is 22.5 kg/m.  Wt Readings from Last 3 Encounters:  10/20/23 148 lb (67.1 kg)  10/05/23 145 lb 4.8 oz (65.9 kg)  09/21/23 142 lb 3.2 oz (64.5 kg)     GEN- The patient is well appearing, alert and oriented x 3 today.   Lungs-  Clear to ausculation bilaterally, normal work of breathing.  Heart- Regular rate and rhythm, no murmurs, rubs or gallops Extremities- No peripheral edema, warm, dry    Studies Reviewed   Previous EP, cardiology notes.    EKG is ordered. Personal review of EKG from today shows:    EKG Interpretation Date/Time:  Tuesday October 20 2023 09:48:05 EDT Ventricular Rate:  65 PR Interval:  146 QRS Duration:  82 QT Interval:  398 QTC Calculation: 413 R Axis:   -17  Text Interpretation: Normal sinus rhythm Minimal voltage criteria for LVH, may be normal variant ( R in aVL ) Confirmed by Nychelle Cassata [phone]) on 10/20/2023 9:53:25 AM     TTE, 09/10/2023  1. Left ventricular ejection fraction, by estimation, is 55 to 60%. The left ventricle has normal function. The left ventricle has no regional wall motion abnormalities. Left ventricular diastolic parameters are consistent with Grade I diastolic dysfunction (impaired relaxation).   2. Right ventricular systolic function is normal. The right ventricular size is normal. There is normal pulmonary artery systolic pressure. The estimated right ventricular systolic pressure is 25.8 mmHg.   3. The mitral valve is normal in structure. No evidence of mitral valve regurgitation. No evidence of mitral stenosis.   4. The aortic valve is normal in structure. Aortic valve regurgitation is not visualized. No aortic stenosis is present.   5. The inferior vena cava is normal in size with greater than 50% respiratory variability, suggesting right atrial pressure of 3  mmHg.   6. Agitated saline contrast bubble study was negative, with no evidence of any interatrial shunt.     Assessment and Plan     #) cryptogenic stroke Discussed rationale for ambulatory monitor and implantable loop recorder to monitor for AFib or Aflutter as possible cause of recent strokes. Patient is clear that he is not interested in either monitoring modality at this time.  We briefly  reviewed the ILR insertion process and monitoring, should he change his mind         Current medicines are reviewed at length with the patient today.   The patient does not have concerns regarding his medicines.  The following changes were made today:  none  Labs/ tests ordered today include:  Orders Placed This Encounter  Procedures   EKG 12-Lead     Disposition: Follow up with EP  PRN    Signed, Adaline Holly, NP  10/20/23  11:33 AM  Electrophysiology [facility]

## 2023-10-19 NOTE — Therapy (Signed)
 OUTPATIENT OCCUPATIONAL THERAPY NEURO TREATMENT  Patient Name: Alan Wise MRN: [id number] DOB:03/16/1971, 53 y.o., male Today's Date: 10/20/2023  PCP: Carollynn Cirri, NP REFERRING PROVIDER: Celia Coles, MD   END OF SESSION:  OT End of Session - 10/20/23 1345     Visit Number 2    Number of Visits 16    Date for OT Re-Evaluation 12/18/23    Authorization Type [employer] 2025 OOPM MET    Authorization Time Period Covered 100% VL:? Auth:?    OT Start Time 1404    OT Stop Time 1446    OT Time Calculation (min) 42 min    Equipment Utilized During Treatment --    Activity Tolerance Patient tolerated treatment well    Behavior During Therapy WFL for tasks assessed/performed             Past Medical History:  Diagnosis Date   Allergy    Complication of anesthesia    pt reports he is starting to have more difficulty arousing after surgery   Diabetes mellitus (HCC)    GERD (gastroesophageal reflux disease)    Headache    History of kidney stones    Hyperlipidemia    Renal disorder    kidney stones   Past Surgical History:  Procedure Laterality Date   APPLICATION OF CRANIAL NAVIGATION Right 01/17/2021   Procedure: APPLICATION OF CRANIAL NAVIGATION;  Surgeon: Van Gelinas, MD;  Location: [facility] OR;  Service: Neurosurgery;  Laterality: Right;   COLONOSCOPY  09/03/1994   COLONOSCOPY WITH PROPOFOL  N/A 01/07/2023   Procedure: COLONOSCOPY WITH PROPOFOL ;  Surgeon: Marnee Sink, MD;  Location: [facility] ENDOSCOPY;  Service: Endoscopy;  Laterality: N/A;  NOT TOO EARLY   CYSTOSCOPY WITH STENT PLACEMENT Right 01/25/2016   Procedure: CYSTOSCOPY WITH STENT PLACEMENT;  Surgeon: Bart Born, MD;  Location: [facility] ORS;  Service: Urology;  Laterality: Right;   FRAMELESS  BIOPSY WITH BRAINLAB Right 01/17/2021   Procedure: RIGHT STEREOTACTIC BIOPSY OF INSULAR LESION;  Surgeon: Van Gelinas, MD;  Location: [facility] OR;  Service: Neurosurgery;  Laterality: Right;   KNEE ARTHROSCOPY  W/ MENISCAL REPAIR Right 06/23/1988   POLYPECTOMY  01/07/2023   Procedure: POLYPECTOMY;  Surgeon: Marnee Sink, MD;  Location: [facility] ENDOSCOPY;  Service: Endoscopy;;   removal of birthmark  06/08/2013   SKIN CANCER EXCISION  06/23/2012   TYMPANOSTOMY TUBE PLACEMENT  08/06/1978   URETEROSCOPY WITH HOLMIUM LASER LITHOTRIPSY Right 01/25/2016   Procedure: URETEROSCOPY WITH HOLMIUM LASER LITHOTRIPSY;  Surgeon: Bart Born, MD;  Location: [facility] ORS;  Service: Urology;  Laterality: Right;   URETHRAL STRICTURE DILATATION     visual inspection of vocal cord     1973   WISDOM TOOTH EXTRACTION     Patient Active Problem List   Diagnosis Date Noted   Post-ictal aphasia 09/22/2023   CVA (cerebral vascular accident) (HCC) 09/20/2023   Abnormal finding on GI tract imaging 09/16/2023   Cerebrovascular accident (CVA) (HCC) 09/15/2023   Dizziness 09/15/2023   Dysphagia 09/14/2023   Gait disorder 09/14/2023   Seizure disorder (HCC) 09/10/2023   Uncontrolled type 2 diabetes mellitus with hypoglycemia, with long-term current use of insulin  (HCC) 09/10/2023   Acute CVA (cerebrovascular accident) (HCC) 09/10/2023   History of CVA (cerebrovascular accident) 09/09/2023   Essential hypertension 09/09/2023   Status epilepticus (HCC) 05/14/2023   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/2021   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   Focal seizures (HCC) 12/31/2020  Type 2 diabetes mellitus with hyperglycemia (HCC) 06/12/2014   Hyperlipidemia associated with type 2 diabetes mellitus (HCC) 06/12/2014    ONSET DATE: Referral: 09/23/2023  Hospitalization 3/30 - 09/25/23  REFERRING DIAG: I63.9 (ICD-10-CM) - Cerebral infarction, unspecified R frontal oligodendroglioma  THERAPY DIAG:  Muscle weakness (generalized)  Other lack of coordination  Other disturbances of skin sensation  Other symptoms and signs involving the nervous system  Visuospatial  deficit  Frontal lobe and executive function deficit  Cerebrovascular accident (CVA) due to thrombosis of precerebral artery (HCC)  Aphasia  Cognitive communication deficit  Rationale for Evaluation and Treatment: Rehabilitation  SUBJECTIVE:   SUBJECTIVE STATEMENT: Pt reports he has to put items on the right side of his body in order to seem them.   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  Presented to the hospital 09/14/2023 with worsening gait left-sided weakness/aphasia and new onset dysphagia.  Of note, he was recently hospitalized from 3-19 to 3-21 with new CVA and breakthrough seizures.  In the ED he was started on IV fluids and MRI of the brain showed new punctate focus of restricted diffusion in the anterior insular cortex compatible with new acute/subacute infarct, stable 8 mm acute/subacute infarct involving the right cerebral peduncle, stable restricted diffusion in the anterior frontal lobe and along the atrium of the right lateral ventricle consistent with treated tumor, improved diffusion changes within the genu of the corpus callosum suggesting these were ischemic, stable chronic encephalomalacia of the right frontal operculum, and stable periventricular T2 hyperintensities in the left hemisphere likely reflects the sequela of chronic microvascular ischemia. He was started on Eliquis , AEDs were adjusted and admitted with evaluation by neurology and Dr. Mark Sil with neuro-oncology, because of recent decline determined to be multifocal secondary to radiation necrosis, breakthrough seizures and multiple recent strokes.  Hospitalization complicated by dysphagia and GI determined to be neurologic.  Last known seizure 09/16/23, with subsequent increase nightly Lamictal .  Therapy evaluations completed  due to patient decreased functional mobility was admitted for a comprehensive rehab program.  PRECAUTIONS: Fall  WEIGHT BEARING RESTRICTIONS: No  PAIN:  Are you having pain? No  FALLS: Has patient fallen in last 6 months? Yes. Number of falls 3 - trying to put pants on, ladder before stroke, carrying cat litter and lost balance on steps  LIVING ENVIRONMENT:  Home Living/Prior Functioning Home Living: Private residence Living Arrangements: Spouse - [name], Children - daughter [name], son 5yo Available Help at Discharge: Family, Available 24 hours/day Type of Home: House Home Access: Stairs to enter (stone steps) Entrance Stairs-Number of Steps: 4 Entrance Stairs-Rails: Right, Left Home Layout: Two level, Able to live on main level with bedroom/bathroom (can live on main level) Alternate Level Stairs-Number of Steps: flight of steps to 2nd floor bedroom Alternate Level Stairs-Rails:  (L for first 5 and R on second 5) Bathroom Shower/Tub: Psychologist, counselling, Sport and exercise psychologist: Standard Bathroom Accessibility: Yes IADL History Homemaking Responsibilities: No Prior Function/Level of Independence: Independent with basic ADLs, Independent with transfers, Independent with gait, Independent with homemaking with ambulation Able to Take Stairs?: Yes Driving: No (not driving for several years due to seizures)  PLOF: Independent with basic ADLs and Independent with household mobility without device Prior interests: Wrestled, ball room dance, soccer, professor of mathematics at [employer]  PATIENT GOALS: Pt reported he needs to work on L  side awareness, vision on left side, strength and putting jacket on.  OBJECTIVE:  Note: Objective measures were completed at Evaluation unless otherwise noted.  HAND DOMINANCE: Left  ADLs: Overall ADLs: Min assist to Mod Ind Transfers/ambulation related to ADLs: Mod Ind Eating: Pt switched to R hand for eating, wife helps cut food Grooming: Wife used clippers  recently UB Dressing: Reports he struggles with button up shirt/jacket,  LB Dressing: Pt spouse report he fell with LB dressing in standing, wife confirms he can tie shoelaces Toileting: Ind Bathing: Mod I Tub Shower transfers: walk in shower with built in bench with B showerheads Equipment: none  IADLs: Shopping: wife Light housekeeping: wife Meal Prep: wife Community mobility: Supervision Medication management: Wife assists Financial management: Wife Handwriting: Increased time - due to aphasia (June 2024 aphasia, speech, reading)  MOBILITY STATUS: Hx of falls  POSTURE COMMENTS:  rounded shoulders and forward head Sitting balance: WFL  ACTIVITY TOLERANCE: Activity tolerance: Fair  FUNCTIONAL OUTCOME MEASURES: TBA  UPPER EXTREMITY ROM:    Active ROM Right eval Left eval  Shoulder flexion 120 90  Shoulder abduction    Shoulder adduction    Shoulder extension    Shoulder internal rotation    Shoulder external rotation    Elbow flexion WNL WFL  Elbow extension WNL WFL  Wrist flexion  Sight limits  Wrist extension    Wrist ulnar deviation    Wrist radial deviation    Wrist pronation    Wrist supination    (Blank rows = not tested)  UPPER EXTREMITY MMT:     MMT Right eval Left eval  Shoulder flexion 4 3-  Shoulder abduction    Shoulder adduction    Shoulder extension    Shoulder internal rotation    Shoulder external rotation    Middle trapezius    Lower trapezius    Elbow flexion 5 4+  Elbow extension 4+ 4  Wrist flexion    Wrist extension    Wrist ulnar deviation    Wrist radial deviation    Wrist pronation    Wrist supination    (Blank rows = not tested)  HAND FUNCTION: Grip strength: Right: 53.5, 57.5, 53.3  lbs; Left: 42.9, 51.8, 41.8  lbs Right: 54.8 lbs Left: 45.5 lbs  COORDINATION: 9 Hole Peg test: Right: 41.24  sec; Left: 1:04.00 sec  SENSATION: Patient initially reported normal but then noted he may have some limtations ie) does  not know   EDEMA: NA  MUSCLE TONE: Some changes ie) not as smooth with movements and coordination  COGNITION: Overall cognitive status: Impaired/Aphasia impacts cognition Hospital: [facility] Summary Score: 12   VISION: Subjective report: January - vision was decreasing Baseline vision: Wears glasses for reading only about 1 year Visual history: changes were happening perhaps a precursor to CVA  MR review: Vision Baseline Vision/History: 1 Wears glasses Ability to See in Adequate Light: 1 Impaired Patient Visual Report: Peripheral vision impairment Vision Assessment?: Wears glasses for reading Eye Alignment: Within Functional Limits Alignment/Gaze Preference: Within Defined Limits Visual Fields: Left homonymous hemianopsia Additional Comments: L peripheral vision decr since January  VISION ASSESSMENT: Tracking/Visual pursuits: Impaired - pt has trouble with vision on L side  Patient has difficulty with following activities due to following visual impairments: walks into walls, chairs etc, ie) doesn't always scan especially if he is tired, agitated, etc  PERCEPTION: Not tested  PRAXIS: Not tested  TREATMENT :    OT assessed pt's tracking abilities. Required increased time and instruction for proper completion. Most difficulty tracking vertically and to the left. Discussed scanning strategies with special attention to these planes of vision as this is where pt is more susceptible of missing an item and falling/getting hurt.   OT initiated vision strategies and scanning activities as noted in pt instructions. Pt required mod cueing and increased time to read paper and incorporate recommendations.   PATIENT EDUCATION: Education details: vision strategies and scanning activities Person educated: Patient and Spouse Education method: Explanation, Demonstration,  Tactile cues, and Verbal cues Education comprehension: verbalized understanding, returned demonstration, verbal cues required, tactile cues required, and needs further education  HOME EXERCISE PROGRAM: 10/19/2023: vision strategies and scanning activities   GOALS: Goals reviewed with patient? Yes  SHORT TERM GOALS: Target date: 11/20/23  Patient will demonstrate initial UE HEP with 25% verbal cues or less for proper execution (d/t B shoulder limitations and L sided weakness s/p CVA). Baseline: New to outpt OT Goal status: INITIAL   2.  Patient will demonstrate at least 5+ lbs improvement for BUE grip strength as needed to open jars and other containers. Baseline: Right: 54.8 lbs Left: 45.5 lbs Goal status: INITIAL   3.  Patient will demo improved FM coordination as evidenced by completing nine-hole peg 5+ seconds faster with use of BUE.  Baseline: 9 Hole Peg test: Right: 41.24  sec; Left: 1:04.00 sec Goal status: INITIAL   4.  Patient will independently recall at least 2 compensatory strategies for visual impairment without cueing. Baseline: New to outpt OT - bumps into furniture Goal status: IN PROGRESS   5. Pt will verbalize understanding of modifications, strategies and/or equipment PRN with visual cues as needed to increase safety and independence with ADLs and IADLs (I.e. with decreased fall risk). Baseline: New to outpt OT - fell donning pants Goal Status: INITIAL   LONG TERM GOALS: Target date: 12/18/23   Patient will demonstrate updated UE HEP with visual handouts only for proper execution.  Baseline: New to outpt OT  Goal status: INITIAL   2.  Patient will return demonstration of visual adaptation use and verbalize understanding of how to obtain item(s) if desired.  Baseline: New to outpt OT Goal status: INITIAL   3.  Patient will demo improved FM coordination as evidenced by completing nine-hole peg with use of BUE as close to 30 seconds as possible.  Baseline: 9  Hole Peg test: Right: 41.24  sec; Left: 1:04.00 sec Goal status: INITIAL   4.  Pt will write/print his name/initials. Baseline: difficult due to aphasia by report Goal status: INITIAL   5.  Pt will report increased ease with donning all clothing items - shirt, socks/shoes without assistance of spouse Baseline: occasional assistance of spouse Goal status: INITIAL  ASSESSMENT:  CLINICAL IMPRESSION: Patient requires increased time and cueing for completion of activities as needed to incorporate strategies to progress towards goals. Vision further complicated by poor attention.   PERFORMANCE DEFICITS: in functional skills including ADLs, IADLs, coordination, dexterity, proprioception, sensation, tone, ROM, strength, pain, fascial restrictions, muscle spasms, flexibility, Fine motor control, Gross motor control, mobility, balance, body mechanics, endurance, decreased knowledge of precautions, decreased knowledge of use of DME, vision, and UE functional use, cognitive skills including attention, emotional, energy/drive, learn, memory, perception, problem solving, safety awareness, sequencing, temperament/personality, thought, and understand, and psychosocial skills including coping strategies, environmental adaptation, habits, interpersonal interactions, and routines and behaviors.   IMPAIRMENTS: are  limiting patient from ADLs, IADLs, rest and sleep, education, work, play, leisure, and social participation.   CO-MORBIDITIES: has co-morbidities such as h/o frontal oligodendroglioma   that affects occupational performance. Patient will benefit from skilled OT to address above impairments and improve overall function.  REHAB POTENTIAL: Good  PLAN:  OT FREQUENCY: 1-2x/week  OT DURATION: 8 weeks  PLANNED INTERVENTIONS: 97535 self care/ADL training, 16109 therapeutic exercise, 97530 therapeutic activity, 97112 neuromuscular re-education, 97140 manual therapy, balance training, functional mobility  training, visual/perceptual remediation/compensation, psychosocial skills training, energy conservation, coping strategies training, patient/family education, and DME and/or AE instructions  RECOMMENDED OTHER SERVICES: NA - other therapies already scheduled at this time  CONSULTED AND AGREED WITH PLAN OF CARE: Patient and family member/caregiver  PLAN FOR NEXT SESSION:   Visual scanning activities - encourage L side awareness (balloon + scanning) HEPs for UE ROM (sh) and strength (putty) and coordination ADL comp strategies   Altamease Asters, OT 10/20/2023, 4:36 PM

## 2023-10-20 ENCOUNTER — Ambulatory Visit: Attending: Cardiology | Admitting: Cardiology

## 2023-10-20 ENCOUNTER — Ambulatory Visit: Admitting: Occupational Therapy

## 2023-10-20 VITALS — BP 150/80 | HR 65 | Ht 68.0 in | Wt 148.0 lb

## 2023-10-20 DIAGNOSIS — R41842 Visuospatial deficit: Secondary | ICD-10-CM

## 2023-10-20 DIAGNOSIS — I639 Cerebral infarction, unspecified: Secondary | ICD-10-CM

## 2023-10-20 DIAGNOSIS — I6300 Cerebral infarction due to thrombosis of unspecified precerebral artery: Secondary | ICD-10-CM

## 2023-10-20 DIAGNOSIS — R4701 Aphasia: Secondary | ICD-10-CM

## 2023-10-20 DIAGNOSIS — M6281 Muscle weakness (generalized): Secondary | ICD-10-CM | POA: Diagnosis not present

## 2023-10-20 DIAGNOSIS — R278 Other lack of coordination: Secondary | ICD-10-CM

## 2023-10-20 DIAGNOSIS — R208 Other disturbances of skin sensation: Secondary | ICD-10-CM

## 2023-10-20 DIAGNOSIS — R29818 Other symptoms and signs involving the nervous system: Secondary | ICD-10-CM

## 2023-10-20 DIAGNOSIS — R41841 Cognitive communication deficit: Secondary | ICD-10-CM

## 2023-10-20 DIAGNOSIS — R41844 Frontal lobe and executive function deficit: Secondary | ICD-10-CM

## 2023-10-20 NOTE — Patient Instructions (Signed)
 Medication Instructions:  The current medical regimen is effective;  continue present plan and medications as directed. Please refer to the Current Medication list given to you today.   *If you need a refill on your cardiac medications before your next appointment, please call your pharmacy*   Follow-Up: At [facility], you and your health needs are our priority.  As part of our continuing mission to provide you with exceptional heart care, our providers are all part of one team.  This team includes your primary Cardiologist (physician) and Advanced Practice Providers or APPs (Physician Assistants and Nurse Practitioners) who all work together to provide you with the care you need, when you need it.  Your next appointment:   As needed  Provider:   Suzann Riddle, NP    We recommend signing up for the patient portal called "MyChart".  Sign up information is provided on this After Visit Summary.  MyChart is used to connect with patients for Virtual Visits (Telemedicine).  Patients are able to view lab/test results, encounter notes, upcoming appointments, etc.  Non-urgent messages can be sent to your provider as well.   To learn more about what you can do with MyChart, go to [email or web address].

## 2023-10-20 NOTE — Patient Instructions (Signed)
 Activities to try at home to encourage visual scanning:   1. Word searches 2. Mazes 3. Puzzles 4. Card games 5. Computer games and/or searches 6. Connect-the-dots  Activities for environmental (larger) scanning:   1. With supervision, scan for items in grocery store or drugstore.  Begin with a familiar store, then progress to a new store you've never been in before. Make sure you have supervision with this.   2. With supervision, tell a family member or caregiver when it is safe to cross a street after looking all directions and any side streets. However, do NOT cross street unless family member or caregiver is with you and says it is OK   1. Look for the edge of objects (to the left and/or right) so that you make sure you are seeing all of an object  2. Turn your head when walking, scan from side to side, particularly in busy environments  3. Use an organized scanning pattern. It's usually easier to scan from top to bottom, and left to right (like you are reading)  4. Double check yourself  5. Use a line guide (like a blank piece of paper) or your finger when reading  6. If necessary, place brightly colored tape at end of table or work area as a reminder to always look until you see the tape.

## 2023-10-21 ENCOUNTER — Ambulatory Visit: Admitting: Speech Pathology

## 2023-10-21 ENCOUNTER — Encounter: Payer: Self-pay | Admitting: Speech Pathology

## 2023-10-21 DIAGNOSIS — R4701 Aphasia: Secondary | ICD-10-CM

## 2023-10-21 DIAGNOSIS — M6281 Muscle weakness (generalized): Secondary | ICD-10-CM | POA: Diagnosis not present

## 2023-10-21 DIAGNOSIS — R41841 Cognitive communication deficit: Secondary | ICD-10-CM

## 2023-10-21 NOTE — Patient Instructions (Addendum)
   Add schools pets  Add bullets about people  Add Places your family goes daily, week, monthly - grocery, pharm, gym, church,   Food Orders  Brands  Meds  Vacation spots ([place])  Personnel officer (Academic librarian)  Activities (garden, plants  you grow)  All of these are good to practice saying (reading aloud)  TV shows  Youtube channels   Add any one you talk with daily, weekly, monthly   Practice words that are important to  your family (T-ball, flute, music, coaches, lessons, Data processing manager, etc)  Name subjects, name restaurants, name foods, name

## 2023-10-21 NOTE — Therapy (Signed)
 OUTPATIENT SPEECH LANGUAGE PATHOLOGY APHASIA EVALUATION   Patient Name: Alan Wise MRN: [id number] DOB:January 07, 1971, 53 y.o., male Today's Date: 10/21/2023  PCP: Carollynn Cirri, NP REFERRING PROVIDER: Celia Coles, MD  END OF SESSION:  End of Session - 10/21/23 1021     Visit Number 1    Number of Visits 17    Date for SLP Re-Evaluation 12/16/23    Authorization Type [employer]    Progress Note Due on Visit 10    SLP Start Time 1015    SLP Stop Time  1100    SLP Time Calculation (min) 45 min    Activity Tolerance Patient tolerated treatment well             Past Medical History:  Diagnosis Date   Allergy    Complication of anesthesia    pt reports he is starting to have more difficulty arousing after surgery   Diabetes mellitus (HCC)    GERD (gastroesophageal reflux disease)    Headache    History of kidney stones    Hyperlipidemia    Renal disorder    kidney stones   Past Surgical History:  Procedure Laterality Date   APPLICATION OF CRANIAL NAVIGATION Right 01/17/2021   Procedure: APPLICATION OF CRANIAL NAVIGATION;  Surgeon: Van Gelinas, MD;  Location: [facility] OR;  Service: Neurosurgery;  Laterality: Right;   COLONOSCOPY  09/03/1994   COLONOSCOPY WITH PROPOFOL  N/A 01/07/2023   Procedure: COLONOSCOPY WITH PROPOFOL ;  Surgeon: Marnee Sink, MD;  Location: [facility] ENDOSCOPY;  Service: Endoscopy;  Laterality: N/A;  NOT TOO EARLY   CYSTOSCOPY WITH STENT PLACEMENT Right 01/25/2016   Procedure: CYSTOSCOPY WITH STENT PLACEMENT;  Surgeon: Bart Born, MD;  Location: [facility] ORS;  Service: Urology;  Laterality: Right;   FRAMELESS  BIOPSY WITH BRAINLAB Right 01/17/2021   Procedure: RIGHT STEREOTACTIC BIOPSY OF INSULAR LESION;  Surgeon: Van Gelinas, MD;  Location: [facility] OR;  Service: Neurosurgery;  Laterality: Right;   KNEE ARTHROSCOPY W/ MENISCAL REPAIR Right 06/23/1988   POLYPECTOMY  01/07/2023   Procedure: POLYPECTOMY;  Surgeon: Marnee Sink, MD;   Location: [facility] ENDOSCOPY;  Service: Endoscopy;;   removal of birthmark  06/08/2013   SKIN CANCER EXCISION  06/23/2012   TYMPANOSTOMY TUBE PLACEMENT  08/06/1978   URETEROSCOPY WITH HOLMIUM LASER LITHOTRIPSY Right 01/25/2016   Procedure: URETEROSCOPY WITH HOLMIUM LASER LITHOTRIPSY;  Surgeon: Bart Born, MD;  Location: [facility] ORS;  Service: Urology;  Laterality: Right;   URETHRAL STRICTURE DILATATION     visual inspection of vocal cord     1973   WISDOM TOOTH EXTRACTION     Patient Active Problem List   Diagnosis Date Noted   Post-ictal aphasia 09/22/2023   CVA (cerebral vascular accident) (HCC) 09/20/2023   Abnormal finding on GI tract imaging 09/16/2023   Cerebrovascular accident (CVA) (HCC) 09/15/2023   Dizziness 09/15/2023   Dysphagia 09/14/2023   Gait disorder 09/14/2023   Seizure disorder (HCC) 09/10/2023   Uncontrolled type 2 diabetes mellitus with hypoglycemia, with long-term current use of insulin  (HCC) 09/10/2023   Acute CVA (cerebrovascular accident) (HCC) 09/10/2023   History of CVA (cerebrovascular accident) 09/09/2023   Essential hypertension 09/09/2023   Status epilepticus (HCC) 05/14/2023   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/2021   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 year old male  who  has a past medical history of  Allergy, Complication of anesthesia, Diabetes mellitus (HCC), GERD (gastroesophageal reflux disease), Headache, History of kidney stones, Hyperlipidemia, and Renal disorder.  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  They presented to the hospital 09/14/23 with worsening gait, left-sided weakness, and new onset dysphagia.  Of note, he was recently hospitalized at home announce from 3-19 to 3-21 with new CVA and breakthrough seizures.  In the ER, he was started on IV fluids and MRI brain showed new punctate focus of restricted diffusion in the anterior insular cortex compatible with a new acute/subacute infarct, stable 8 mm acute/subacute infarct involving the right cerebral peduncle, stable restricted diffusion in the anterior right frontal lobe and along the atrium of the right lateral ventricle consistent with treated tumor. Known to use from Speech Evaluation prior to CVA 09/09/23, however no therapy started due to hospitalization 09/09/23.   PAIN:  Are you having pain? No  FALLS: Has patient fallen in last 6 months?  See PT evaluation for details  LIVING ENVIRONMENT: Lives with: lives with their family, spouse, 9 year old daughter and 70 year old son Lives in: House/apartment  PLOF:  Level of assistance: Independent with ADLs, Independent with IADLs Employment: Full-time employment calculus professor, now applying for disability  PATIENT GOALS: To improve communication.   OBJECTIVE:  Note: Objective measures were completed at Evaluation unless otherwise noted.   COGNITION: Overall cognitive status: Difficulty to assess due to: Communication impairment Areas of impairment:   Functional deficits:   AUDITORY COMPREHENSION: Overall auditory comprehension: Impaired: complex YES/NO questions: Impaired: complex Following directions: Impaired: complex Conversation: Simple Interfering components: visual impairments Effective technique: pausing,  repetition/stressing words, and slowed speech  READING COMPREHENSION: Impaired: phrase due to visual impairments. Can read words in center of page with large print  EXPRESSION: verbal and nonverbal- gestures  VERBAL EXPRESSION: Level of generative/spontaneous verbalization: word Automatic speech: name: intact and social response: intact  Repetition: Impaired: Word Naming: Confrontation: 26-50% and Divergent: 0-25% Pragmatics: Appears intact Comments: named 2 animals, 43school subjects; confrontation for simple objects - 4/6 Interfering components:    Effective technique: semantic cues, phonemic cues, and 1st letter cues Non-verbal means of communication: gestures  WRITTEN EXPRESSION: Dominant hand: right Written expression: Not tested   MOTOR SPEECH: Overall motor speech: Appears intact  ORAL MOTOR EXAMINATION: Overall status: WFL Comments:   STANDARDIZED ASSESSMENTS: N/A  PATIENT REPORTED OUTCOME MEASURES (PROM): deferred  TREATMENT DATE:   10/21/23: Evaluation completed - see above. Collin Deal brought Neurosurgeon we started to the hospital however it was lost. Reviewed what to include in communication support book - she agrees to re-print the pages, adding restaurant orders, bullet points about friends and family, places they frequent. Cued them to think about people, places, items the encounter daily, weekly and monthly - generated ideas and words for book - See patient instructions. Generated home practice of naming items around the home, children's activities, school subjects they study, upcoming activities. Instructed them to also practice the words in his communication support book as these are the most personally relevant. In  divergent naming task (school subjects) Joanathan benefits from Topic and numbered visual cues to remain on task and  avoid tangential speech/language. Spouse endorses they have a white board at home and will use this strategy during naming tasks at home.     PATIENT EDUCATION: Education details: See Treatment; See Patient Instructions, compensations for aphasia, communication supports, language activities to do at home Person educated: Patient and Spouse Education method: Explanation, Demonstration, Verbal cues, and Handouts Education comprehension: verbalized understanding, returned demonstration, verbal cues required, and needs further education   GOALS: Goals reviewed with patient? Yes - 11/18/23 - target date Pt will use multimodal communication to communicate family names and biographical information with rare min A Baseline: Goal status: INITIAL   2.  Pt and spouse will generate 4 personally relevant places and orders to add to communication support book with occasional min A Baseline:  Goal status: INITIAL   3.  Pt will use multimodal communication to name 8 items in a personally relevant category with occasional min A Baseline:  Goal status: INITIAL   4.  Pt will use external aids to attend to the left when reading sentences  Baseline:  Goal status: INITIAL   5.  Pt will use verbal compensations for aphasia in structured tasks with occasional min A Baseline:  Goal status: INITIAL       LONG TERM GOALS: Target date: 12/16/23   Pt will use communication support notebook and scripting to place order at restaurant and to make 2 business calls Baseline:  Goal status: INITIAL   2.  Pt will use multimodal communication to communicate preferences in simple conversation Baseline:  Goal status: INITIAL   3.  Pt and spouse will carryover 3 compensatory strategies to support pt's auditory comprehension Baseline:  Goal status: INITIAL   4.  Pt will generate moderately complex sentences in structured task such as VNeST with usual min A Baseline:  Goal status: INITIAL   5.  Pt and spouse  will carryover 1-2 language enhancing activities at home daily 5/7 days Baseline:  Goal status: INITIAL   6.  Pt and spouse will add 6 new topics/words to communication support notebook Baseline:  Goal status: INITIAL   ASSESSMENT:  CLINICAL IMPRESSION: Patient is a 53 y.o. male who was seen today for moderate aphasia c/b empty, vague language, halting for word finding difficulties. Auditory comprehension impairments noted in conversation gathering history. Written expression impaired at phrase level, spouse endorses texts often don't make sense. "He texts like he speaks". Due to aphasia, Chaney plans to retire at the end of this semester. He had difficulty stating spouse and children's names and his address. He endorses significant frustration.  They do endorse some attempts at multimodal communication with inconsistent success. After prior evaluation, they generated communication support book, however this was lost.  I recommend skilled  ST to maximize communication for safety, to reduce caregiver burden and improve QOL.   OBJECTIVE IMPAIRMENTS: include aphasia. These impairments are limiting patient from return to work, managing medications, managing appointments, managing finances, ADLs/IADLs, and effectively communicating at home and in community. Factors affecting potential to achieve goals and functional outcome are co-morbidities and severity of impairments. Patient will benefit from skilled SLP services to address above impairments and improve overall function.  REHAB POTENTIAL: Good  PLAN:  SLP FREQUENCY: 2x/week  SLP DURATION: 8 weeks  PLANNED INTERVENTIONS: Language facilitation, Environmental controls, Cueing hierachy, Cognitive reorganization, Internal/external aids, Functional tasks, Multimodal communication approach, SLP instruction and feedback, Compensatory strategies, Patient/family education, [phone] Treatment of speech (30 or 45 min) , and [id number]- Speech Eval Sound Prod,  Artic, Phon, Eval Compre, Express    Glennis Borger, Dareen Ebbing, CCC-SLP 10/21/2023, 12:18 PM

## 2023-10-22 ENCOUNTER — Other Ambulatory Visit: Payer: Self-pay

## 2023-10-22 ENCOUNTER — Ambulatory Visit: Attending: Physical Medicine and Rehabilitation | Admitting: Occupational Therapy

## 2023-10-22 DIAGNOSIS — R41842 Visuospatial deficit: Secondary | ICD-10-CM | POA: Diagnosis present

## 2023-10-22 DIAGNOSIS — R4701 Aphasia: Secondary | ICD-10-CM

## 2023-10-22 DIAGNOSIS — R208 Other disturbances of skin sensation: Secondary | ICD-10-CM

## 2023-10-22 DIAGNOSIS — R41841 Cognitive communication deficit: Secondary | ICD-10-CM

## 2023-10-22 DIAGNOSIS — R278 Other lack of coordination: Secondary | ICD-10-CM | POA: Diagnosis present

## 2023-10-22 DIAGNOSIS — R41844 Frontal lobe and executive function deficit: Secondary | ICD-10-CM

## 2023-10-22 DIAGNOSIS — C719 Malignant neoplasm of brain, unspecified: Secondary | ICD-10-CM | POA: Insufficient documentation

## 2023-10-22 DIAGNOSIS — M6281 Muscle weakness (generalized): Secondary | ICD-10-CM

## 2023-10-22 DIAGNOSIS — R29818 Other symptoms and signs involving the nervous system: Secondary | ICD-10-CM

## 2023-10-22 DIAGNOSIS — R2689 Other abnormalities of gait and mobility: Secondary | ICD-10-CM | POA: Insufficient documentation

## 2023-10-22 DIAGNOSIS — I6300 Cerebral infarction due to thrombosis of unspecified precerebral artery: Secondary | ICD-10-CM

## 2023-10-22 NOTE — Therapy (Signed)
 OUTPATIENT OCCUPATIONAL THERAPY NEURO TREATMENT  Patient Name: Alan Wise MRN: [id number] DOB:11-11-1970, 53 y.o., male Today's Date: 10/22/2023  PCP: Carollynn Cirri, NP REFERRING PROVIDER: Celia Coles, MD   END OF SESSION:  OT End of Session - 10/22/23 1452     Visit Number 3    Number of Visits 16    Date for OT Re-Evaluation 12/18/23    Authorization Type [employer] 2025 OOPM MET    Authorization Time Period Covered 100% VL:? Auth:?    OT Start Time 1404    OT Stop Time 1444    OT Time Calculation (min) 40 min    Activity Tolerance Patient tolerated treatment well    Behavior During Therapy WFL for tasks assessed/performed              Past Medical History:  Diagnosis Date   Allergy    Complication of anesthesia    pt reports he is starting to have more difficulty arousing after surgery   Diabetes mellitus (HCC)    GERD (gastroesophageal reflux disease)    Headache    History of kidney stones    Hyperlipidemia    Renal disorder    kidney stones   Past Surgical History:  Procedure Laterality Date   APPLICATION OF CRANIAL NAVIGATION Right 01/17/2021   Procedure: APPLICATION OF CRANIAL NAVIGATION;  Surgeon: Van Gelinas, MD;  Location: [facility] OR;  Service: Neurosurgery;  Laterality: Right;   COLONOSCOPY  09/03/1994   COLONOSCOPY WITH PROPOFOL  N/A 01/07/2023   Procedure: COLONOSCOPY WITH PROPOFOL ;  Surgeon: Marnee Sink, MD;  Location: [facility] ENDOSCOPY;  Service: Endoscopy;  Laterality: N/A;  NOT TOO EARLY   CYSTOSCOPY WITH STENT PLACEMENT Right 01/25/2016   Procedure: CYSTOSCOPY WITH STENT PLACEMENT;  Surgeon: Bart Born, MD;  Location: [facility] ORS;  Service: Urology;  Laterality: Right;   FRAMELESS  BIOPSY WITH BRAINLAB Right 01/17/2021   Procedure: RIGHT STEREOTACTIC BIOPSY OF INSULAR LESION;  Surgeon: Van Gelinas, MD;  Location: [facility] OR;  Service: Neurosurgery;  Laterality: Right;   KNEE ARTHROSCOPY W/ MENISCAL REPAIR Right 06/23/1988    POLYPECTOMY  01/07/2023   Procedure: POLYPECTOMY;  Surgeon: Marnee Sink, MD;  Location: [facility] ENDOSCOPY;  Service: Endoscopy;;   removal of birthmark  06/08/2013   SKIN CANCER EXCISION  06/23/2012   TYMPANOSTOMY TUBE PLACEMENT  08/06/1978   URETEROSCOPY WITH HOLMIUM LASER LITHOTRIPSY Right 01/25/2016   Procedure: URETEROSCOPY WITH HOLMIUM LASER LITHOTRIPSY;  Surgeon: Bart Born, MD;  Location: [facility] ORS;  Service: Urology;  Laterality: Right;   URETHRAL STRICTURE DILATATION     visual inspection of vocal cord     1973   WISDOM TOOTH EXTRACTION     Patient Active Problem List   Diagnosis Date Noted   Post-ictal aphasia 09/22/2023   CVA (cerebral vascular accident) (HCC) 09/20/2023   Abnormal finding on GI tract imaging 09/16/2023   Cerebrovascular accident (CVA) (HCC) 09/15/2023   Dizziness 09/15/2023   Dysphagia 09/14/2023   Gait disorder 09/14/2023   Seizure disorder (HCC) 09/10/2023   Uncontrolled type 2 diabetes mellitus with hypoglycemia, with long-term current use of insulin  (HCC) 09/10/2023   Acute CVA (cerebrovascular accident) (HCC) 09/10/2023   History of CVA (cerebrovascular accident) 09/09/2023   Essential hypertension 09/09/2023   Status epilepticus (HCC) 05/14/2023   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/2021   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   Focal seizures (HCC) 12/31/2020   Type 2 diabetes mellitus with hyperglycemia (  HCC) 06/12/2014   Hyperlipidemia associated with type 2 diabetes mellitus (HCC) 06/12/2014    ONSET DATE: Referral: 09/23/2023  Hospitalization 3/30 - 09/25/23  REFERRING DIAG: I63.9 (ICD-10-CM) - Cerebral infarction, unspecified R frontal oligodendroglioma  THERAPY DIAG:  Muscle weakness (generalized)  Other lack of coordination  Other disturbances of skin sensation  Other symptoms and signs involving the nervous system  Visuospatial deficit  Frontal lobe and executive  function deficit  Cerebrovascular accident (CVA) due to thrombosis of precerebral artery (HCC)  Aphasia  Cognitive communication deficit  Rationale for Evaluation and Treatment: Rehabilitation  SUBJECTIVE:   SUBJECTIVE STATEMENT: Pt's spouse provided some subjective information secondary to pt's difficulty with communication. Pt's spouse reported this week has been a little better, pt has been steadily improving since the stroke. Pt's spouse reported pt continuing to have difficulty with communication. Pt and spouse reported today is a bit more difficult, more communication issues and difficulty swallowing when eating though addressing these concerns with speech therapy.  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  Presented to the hospital 09/14/2023 with worsening gait left-sided weakness/aphasia and new onset dysphagia.  Of note, he was recently hospitalized from 3-19 to 3-21 with new CVA and breakthrough seizures.  In the ED he was started on IV fluids and MRI of the brain showed new punctate focus of restricted diffusion in the anterior insular cortex compatible with new acute/subacute infarct, stable 8 mm acute/subacute infarct involving the right cerebral peduncle, stable restricted diffusion in the anterior frontal lobe and along the atrium of the right lateral ventricle consistent with treated tumor, improved diffusion changes within the genu of the corpus callosum suggesting these were ischemic, stable chronic encephalomalacia of the right frontal operculum, and stable periventricular T2 hyperintensities in the left hemisphere likely reflects the sequela of chronic microvascular ischemia. He was started on Eliquis , AEDs were adjusted and admitted with evaluation by neurology and Dr. Mark Sil with  neuro-oncology, because of recent decline determined to be multifocal secondary to radiation necrosis, breakthrough seizures and multiple recent strokes.  Hospitalization complicated by dysphagia and GI determined to be neurologic.  Last known seizure 09/16/23, with subsequent increase nightly Lamictal .  Therapy evaluations completed due to patient decreased functional mobility was admitted for a comprehensive rehab program.  PRECAUTIONS: Fall  WEIGHT BEARING RESTRICTIONS: No  PAIN:  Are you having pain? No  FALLS: Has patient fallen in last 6 months? Yes. Number of falls 3 - trying to put pants on, ladder before stroke, carrying cat litter and lost balance on steps  LIVING ENVIRONMENT:  Home Living/Prior Functioning Home Living: Private residence Living Arrangements: Spouse - [name], Children - daughter [name], son 5yo Available Help at Discharge: Family, Available 24 hours/day Type of Home: House Home Access: Stairs to enter (stone steps) Entrance Stairs-Number of Steps: 4 Entrance Stairs-Rails: Right, Left Home Layout: Two level, Able to live on main level with bedroom/bathroom (can live on main level) Alternate Level Stairs-Number of Steps: flight of steps to 2nd floor bedroom Alternate Level Stairs-Rails:  (L for first 5 and R on second 5) Bathroom Shower/Tub: Psychologist, counselling, Door [employer] Toilet: Standard Bathroom Accessibility: Yes IADL History Homemaking Responsibilities: No Prior Function/Level of Independence: Independent with basic ADLs, Independent with transfers, Independent with gait, Independent with homemaking with ambulation Able to Take Stairs?: Yes Driving: No (not driving for several years  due to seizures)  PLOF: Independent with basic ADLs and Independent with household mobility without device Prior interests: Wrestled, ball room dance, soccer, professor of mathematics at [employer]  PATIENT GOALS: Pt reported he needs to work on L side awareness, vision on left side,  strength and putting jacket on.  OBJECTIVE:  Note: Objective measures were completed at Evaluation unless otherwise noted.  HAND DOMINANCE: Left  ADLs: Overall ADLs: Min assist to Mod Ind Transfers/ambulation related to ADLs: Mod Ind Eating: Pt switched to R hand for eating, wife helps cut food Grooming: Wife used clippers recently UB Dressing: Reports he struggles with button up shirt/jacket,  LB Dressing: Pt spouse report he fell with LB dressing in standing, wife confirms he can tie shoelaces Toileting: Ind Bathing: Mod I Tub Shower transfers: walk in shower with built in bench with B showerheads Equipment: none  IADLs: Shopping: wife Light housekeeping: wife Meal Prep: wife Community mobility: Supervision Medication management: Wife assists Financial management: Wife Handwriting: Increased time - due to aphasia (June 2024 aphasia, speech, reading)  MOBILITY STATUS: Hx of falls  POSTURE COMMENTS:  rounded shoulders and forward head Sitting balance: WFL  ACTIVITY TOLERANCE: Activity tolerance: Fair  FUNCTIONAL OUTCOME MEASURES: TBA  UPPER EXTREMITY ROM:    Active ROM Right eval Left eval  Shoulder flexion 120 90  Shoulder abduction    Shoulder adduction    Shoulder extension    Shoulder internal rotation    Shoulder external rotation    Elbow flexion WNL WFL  Elbow extension WNL WFL  Wrist flexion  Sight limits  Wrist extension    Wrist ulnar deviation    Wrist radial deviation    Wrist pronation    Wrist supination    (Blank rows = not tested)  UPPER EXTREMITY MMT:     MMT Right eval Left eval  Shoulder flexion 4 3-  Shoulder abduction    Shoulder adduction    Shoulder extension    Shoulder internal rotation    Shoulder external rotation    Middle trapezius    Lower trapezius    Elbow flexion 5 4+  Elbow extension 4+ 4  Wrist flexion    Wrist extension    Wrist ulnar deviation    Wrist radial deviation    Wrist pronation    Wrist  supination    (Blank rows = not tested)  HAND FUNCTION: Grip strength: Right: 53.5, 57.5, 53.3  lbs; Left: 42.9, 51.8, 41.8  lbs Right: 54.8 lbs Left: 45.5 lbs  COORDINATION: 9 Hole Peg test: Right: 41.24  sec; Left: 1:04.00 sec  SENSATION: Patient initially reported normal but then noted he may have some limtations ie) does not know   EDEMA: NA  MUSCLE TONE: Some changes ie) not as smooth with movements and coordination  COGNITION: Overall cognitive status: Impaired/Aphasia impacts cognition Hospital: [facility] Summary Score: 12   VISION: Subjective report: January - vision was decreasing Baseline vision: Wears glasses for reading only about 1 year Visual history: changes were happening perhaps a precursor to CVA  MR review: Vision Baseline Vision/History: 1 Wears glasses Ability to See in Adequate Light: 1 Impaired Patient Visual Report: Peripheral vision impairment Vision Assessment?: Wears glasses for reading Eye Alignment: Within Functional Limits Alignment/Gaze Preference: Within Defined Limits Visual Fields: Left homonymous hemianopsia Additional Comments: L peripheral vision decr since January  VISION ASSESSMENT: Tracking/Visual pursuits: Impaired - pt has trouble with vision on L side  Patient has difficulty with following activities due to following visual impairments:  walks into walls, chairs etc, ie) doesn't always scan especially if he is tired, agitated, etc  10/22/23 -  R side peripheral vision: approx. 70* L side peripheral vision: visual field cut, unable to see marker until midline.  Pt reported difficulty seeing objects on L side.  PERCEPTION: Not tested  PRAXIS: Not tested                                                                                                                           TREATMENT DATE:    [facility] OT assessed pt's peripheral vision, see objective measures above.  HEP update: Theraputty (pink) - to improve FM coordination and  strengthening of BUE. Brightly-colored visual anchor provided on L side of handout. Pt benefited from therapist modeling and v/c.  Access Code: [email or web address] URL: [email or web address] Date: 10/22/2023 Prepared by: Daina Drum  Exercises - Putty Squeezes  - 1 x daily - 1 sets - 10 reps - Rolling Putty on Table  - 1 x daily - 1 sets - 10 reps - Tip Pinch with Putty  - 1 x daily - 1 sets - 10 reps - Finger Key Grip with Putty  - 1 x daily - 1 sets - 10 reps - Marble/Golf Tee Pick Up from Putty  - 1 x daily - 1 sets - 10 reps   Visual scanning task - search and find book - Items placed on L side - to improve visual scanning to L side, to improve understanding of vision compensation strategies. Brightly-colored visual anchor provided on L side of page. Pt ind demo'd good strategies of tracking with pointer finger, systematic scanning method, and turning head to L side. Pt sometimes benefited from v/c to continue turning head to look all the way to L side.   Rotating golf balls BUE - to improve BUE in-hand manipulation, to improve FM coordination and dexterity.     PATIENT EDUCATION: Education details: OT role, POC considerations and dressing recommendations Person educated: Patient and Spouse Education method: Explanation, Demonstration, Tactile cues, and Verbal cues Education comprehension: verbalized understanding, returned demonstration, verbal cues required, tactile cues required, and needs further education  HOME EXERCISE PROGRAM: TBD   GOALS: Goals reviewed with patient? Yes  SHORT TERM GOALS: Target date: 11/20/23  Patient will demonstrate initial UE HEP with 25% verbal cues or less for proper execution (d/t B shoulder limitations and L sided weakness s/p CVA). Baseline: New to outpt OT Goal status: INITIAL   2.  Patient will demonstrate at least 5+ lbs improvement for BUE grip strength as needed to open jars and other containers. Baseline: Right: 54.8 lbs Left:  45.5 lbs Goal status: INITIAL   3.  Patient will demo improved FM coordination as evidenced by completing nine-hole peg 5+ seconds faster with use of BUE.  Baseline: 9 Hole Peg test: Right: 41.24  sec; Left: 1:04.00 sec Goal status: INITIAL   4.  Patient will independently recall at least 2 compensatory strategies for  visual impairment without cueing. Baseline: New to outpt OT - bumps into furniture Goal status: INITIAL   5. Pt will verbalize understanding of modifications, strategies and/or equipment PRN with visual cues as needed to increase safety and independence with ADLs and IADLs (I.e. with decreased fall risk). Baseline: New to outpt OT - fell donning pants Goal Status: INITIAL   LONG TERM GOALS: Target date: 12/18/23   Patient will demonstrate updated UE HEP with visual handouts only for proper execution.  Baseline: New to outpt OT  Goal status: INITIAL   2.  Patient will return demonstration of visual adaptation use and verbalize understanding of how to obtain item(s) if desired.  Baseline: New to outpt OT Goal status: INITIAL   3.  Patient will demo improved FM coordination as evidenced by completing nine-hole peg with use of BUE as close to 30 seconds as possible.  Baseline: 9 Hole Peg test: Right: 41.24  sec; Left: 1:04.00 sec Goal status: INITIAL   4.  Pt will write/print his name/initials. Baseline: difficult due to aphasia by report Goal status: INITIAL   5.  Pt will report increased ease with donning all clothing items - shirt, socks/shoes without assistance of spouse Baseline: occasional assistance of spouse Goal status: INITIAL  ASSESSMENT:  CLINICAL IMPRESSION: Pt tolerated tasks well and demo'ing good carryover of visual compensation strategies. Pt would benefit from skilled OT services in the outpatient setting to work on impairments as noted below to help pt return to PLOF as able.    PERFORMANCE DEFICITS: in functional skills including ADLs, IADLs,  coordination, dexterity, proprioception, sensation, tone, ROM, strength, pain, fascial restrictions, muscle spasms, flexibility, Fine motor control, Gross motor control, mobility, balance, body mechanics, endurance, decreased knowledge of precautions, decreased knowledge of use of DME, vision, and UE functional use, cognitive skills including attention, emotional, energy/drive, learn, memory, perception, problem solving, safety awareness, sequencing, temperament/personality, thought, and understand, and psychosocial skills including coping strategies, environmental adaptation, habits, interpersonal interactions, and routines and behaviors.   IMPAIRMENTS: are limiting patient from ADLs, IADLs, rest and sleep, education, work, play, leisure, and social participation.   CO-MORBIDITIES: has co-morbidities such as h/o frontal oligodendroglioma   that affects occupational performance. Patient will benefit from skilled OT to address above impairments and improve overall function.  REHAB POTENTIAL: Good  PLAN:  OT FREQUENCY: 1-2x/week  OT DURATION: 8 weeks  PLANNED INTERVENTIONS: 97535 self care/ADL training, 16109 therapeutic exercise, 97530 therapeutic activity, 97112 neuromuscular re-education, 97140 manual therapy, balance training, functional mobility training, visual/perceptual remediation/compensation, psychosocial skills training, energy conservation, coping strategies training, patient/family education, and DME and/or AE instructions  RECOMMENDED OTHER SERVICES: NA - other therapies already scheduled at this time  CONSULTED AND AGREED WITH PLAN OF CARE: Patient and family member/caregiver  PLAN FOR NEXT SESSION:  More thorough evaluation of vision  Visual scanning activities - encourage L side awareness (balloon + scanning) Review theraputty HEP HEP update: UE ROM (sh) and lower-level coordination (2-4 exercises) ADL comp strategies   Oakley Bellman, OT 10/22/2023, 2:58 PM

## 2023-10-26 ENCOUNTER — Ambulatory Visit: Admitting: Speech Pathology

## 2023-10-26 ENCOUNTER — Encounter: Payer: Self-pay | Admitting: Speech Pathology

## 2023-10-26 DIAGNOSIS — M6281 Muscle weakness (generalized): Secondary | ICD-10-CM | POA: Diagnosis not present

## 2023-10-26 DIAGNOSIS — R4701 Aphasia: Secondary | ICD-10-CM

## 2023-10-26 NOTE — Patient Instructions (Signed)
   Write/review names, graduation words that he may encounter  Congratulations  Retirement  Service Award

## 2023-10-26 NOTE — Therapy (Signed)
 OUTPATIENT SPEECH LANGUAGE PATHOLOGY APHASIA TREATMENT   Patient Name: Alan Wise MRN: [id number] DOB:August 06, 1970, 53 y.o., male Today's Date: 10/26/2023  PCP: Carollynn Cirri, NP REFERRING PROVIDER: Celia Coles, MD  END OF SESSION:  End of Session - 10/26/23 1100     Visit Number 2    Number of Visits 17    Date for SLP Re-Evaluation 12/16/23    Authorization Type [employer]    SLP Start Time 1100    SLP Stop Time  1145    SLP Time Calculation (min) 45 min    Activity Tolerance Patient tolerated treatment well             Past Medical History:  Diagnosis Date   Allergy    Complication of anesthesia    pt reports he is starting to have more difficulty arousing after surgery   Diabetes mellitus (HCC)    GERD (gastroesophageal reflux disease)    Headache    History of kidney stones    Hyperlipidemia    Renal disorder    kidney stones   Past Surgical History:  Procedure Laterality Date   APPLICATION OF CRANIAL NAVIGATION Right 01/17/2021   Procedure: APPLICATION OF CRANIAL NAVIGATION;  Surgeon: Alan Gelinas, MD;  Location: [facility] OR;  Service: Neurosurgery;  Laterality: Right;   COLONOSCOPY  09/03/1994   COLONOSCOPY WITH PROPOFOL  N/A 01/07/2023   Procedure: COLONOSCOPY WITH PROPOFOL ;  Surgeon: Alan Sink, MD;  Location: [facility] ENDOSCOPY;  Service: Endoscopy;  Laterality: N/A;  NOT TOO EARLY   CYSTOSCOPY WITH STENT PLACEMENT Right 01/25/2016   Procedure: CYSTOSCOPY WITH STENT PLACEMENT;  Surgeon: Alan Born, MD;  Location: [facility] ORS;  Service: Urology;  Laterality: Right;   FRAMELESS  BIOPSY WITH BRAINLAB Right 01/17/2021   Procedure: RIGHT STEREOTACTIC BIOPSY OF INSULAR LESION;  Surgeon: Alan Gelinas, MD;  Location: [facility] OR;  Service: Neurosurgery;  Laterality: Right;   KNEE ARTHROSCOPY W/ MENISCAL REPAIR Right 06/23/1988   POLYPECTOMY  01/07/2023   Procedure: POLYPECTOMY;  Surgeon: Alan Sink, MD;  Location: [facility] ENDOSCOPY;  Service:  Endoscopy;;   removal of birthmark  06/08/2013   SKIN CANCER EXCISION  06/23/2012   TYMPANOSTOMY TUBE PLACEMENT  08/06/1978   URETEROSCOPY WITH HOLMIUM LASER LITHOTRIPSY Right 01/25/2016   Procedure: URETEROSCOPY WITH HOLMIUM LASER LITHOTRIPSY;  Surgeon: Alan Born, MD;  Location: [facility] ORS;  Service: Urology;  Laterality: Right;   URETHRAL STRICTURE DILATATION     visual inspection of vocal cord     1973   WISDOM TOOTH EXTRACTION     Patient Active Problem List   Diagnosis Date Noted   Post-ictal aphasia 09/22/2023   CVA (cerebral vascular accident) (HCC) 09/20/2023   Abnormal finding on GI tract imaging 09/16/2023   Cerebrovascular accident (CVA) (HCC) 09/15/2023   Dizziness 09/15/2023   Dysphagia 09/14/2023   Gait disorder 09/14/2023   Seizure disorder (HCC) 09/10/2023   Uncontrolled type 2 diabetes mellitus with hypoglycemia, with long-term current use of insulin  (HCC) 09/10/2023   Acute CVA (cerebrovascular accident) (HCC) 09/10/2023   History of CVA (cerebrovascular accident) 09/09/2023   Essential hypertension 09/09/2023   Status epilepticus (HCC) 05/14/2023   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/2021   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   Focal seizures (HCC) 12/31/2020   Type 2 diabetes mellitus with hyperglycemia (HCC) 06/12/2014   Hyperlipidemia associated with type 2 diabetes mellitus (HCC) 06/12/2014    ONSET DATE: 09-09-23  REFERRING DIAG: I63.9 (ICD-10-CM) - Cerebral infarction, unspecified R47.01 (ICD-10-CM) - Aphasia  THERAPY DIAG:  Aphasia  Rationale for Evaluation and Treatment: Rehabilitation  SUBJECTIVE:   SUBJECTIVE STATEMENT: "Today is good, but the evenings are low" Pt accompanied by: significant other - spouse Alan Wise  PERTINENT HISTORY: Pt is a  53 y.o. year old male  who  has a past medical history of Allergy, Complication of anesthesia, Diabetes mellitus (HCC), GERD  (gastroesophageal reflux disease), Headache, History of kidney stones, Hyperlipidemia, and Renal disorder.  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  They presented to the hospital 09/14/23 with worsening gait, left-sided weakness, and new onset dysphagia.  Of note, he was recently hospitalized at home announce from 3-19 to 3-21 with new CVA and breakthrough seizures.  In the ER, he was started on IV fluids and MRI brain showed new punctate focus of restricted diffusion in the anterior insular cortex compatible with a new acute/subacute infarct, stable 8 mm acute/subacute infarct involving the right cerebral peduncle, stable restricted diffusion in the anterior right frontal lobe and along the atrium of the right lateral ventricle consistent with treated tumor. Known to use from Speech Evaluation prior to CVA 09/09/23, however no therapy started due to hospitalization 09/09/23.   PAIN:  Are you having pain? No                                                                                                                            TREATMENT DATE:   10/26/23: Alan Wise enters with communication notebook he and his wife have generated. He is requesting he words be moved to the right of the page rather than centered. Attempted [employer] Music therapist) - Alan Wise required max A (written choice of 2) to generate 3 subjects and verbs for the 1 verb (measure) as well as max A written or verbal choice of 2 to answer "wh" questions to generate complex sentence with 1 set of subject/verb. Targeted verbal compensations for aphasia generated 3-4 descriptions of family members. With visual organizer of Name with 1-4 numbered below to support attention to task and with usual mod questioning cues and verbal cues, he generated 4 descriptions of his daughter. Targeted divergent naming in personally relevant category (ballroom dancing) with extended time and occasional min 1st letter  or semantic cue, Alan Wise named 10 ballroom dances.   10/21/23: Evaluation completed - see above. Alan Wise brought communication notebook we started to the hospital however it was lost. Reviewed what to include in communication support book - she agrees to re-print the pages, adding restaurant orders, bullet points about friends and family, places they frequent. Cued them to think about people, places, items the encounter daily, weekly and monthly - generated ideas and words for book - See patient instructions. Generated home practice of naming items around the home, children's activities, school subjects they study, upcoming activities. Instructed them to also practice the words in his communication support book as these  are the most personally relevant. In  divergent naming task (school subjects) Yue benefits from Topic and numbered visual cues to remain on task and avoid tangential speech/language. Spouse endorses they have a white board at home and will use this strategy during naming tasks at home.     PATIENT EDUCATION: Education details: See Treatment; See Patient Instructions, compensations for aphasia, communication supports, language activities to do at home Person educated: Patient and Spouse Education method: Explanation, Demonstration, Verbal cues, and Handouts Education comprehension: verbalized understanding, returned demonstration, verbal cues required, and needs further education   GOALS: Goals reviewed with patient? Yes - 11/18/23 - target date Pt will use multimodal communication to communicate family names and biographical information with rare min A Baseline: Goal status: INITIAL   2.  Pt and spouse will generate 4 personally relevant places and orders to add to communication support book with occasional min A Baseline:  Goal status: INITIAL   3.  Pt will use multimodal communication to name 8 items in a personally relevant category with occasional min A Baseline:  Goal  status: INITIAL   4.  Pt will use external aids to attend to the left when reading sentences  Baseline:  Goal status: INITIAL   5.  Pt will use verbal compensations for aphasia in structured tasks with occasional min A Baseline:  Goal status: INITIAL       LONG TERM GOALS: Target date: 12/16/23   Pt will use communication support notebook and scripting to place order at restaurant and to make 2 business calls Baseline:  Goal status: INITIAL   2.  Pt will use multimodal communication to communicate preferences in simple conversation Baseline:  Goal status: INITIAL   3.  Pt and spouse will carryover 3 compensatory strategies to support pt's auditory comprehension Baseline:  Goal status: INITIAL   4.  Pt will generate moderately complex sentences in structured task such as VNeST with usual min A Baseline:  Goal status: INITIAL   5.  Pt and spouse will carryover 1-2 language enhancing activities at home daily 5/7 days Baseline:  Goal status: INITIAL   6.  Pt and spouse will add 6 new topics/words to communication support notebook Baseline:  Goal status: INITIAL   ASSESSMENT:  CLINICAL IMPRESSION: Patient is a 53 y.o. male who was seen today for moderate aphasia c/b empty, vague language, halting for word finding difficulties. Auditory comprehension impairments noted in conversation gathering history. Written expression impaired at phrase level, spouse endorses texts often don't make sense. "He texts like he speaks". Due to aphasia, Elisio plans to retire at the end of this semester. He had difficulty stating spouse and children's names and his address. He endorses significant frustration.  They do endorse some attempts at multimodal communication with inconsistent success. After prior evaluation, they generated communication support book, however this was lost.  I recommend skilled ST to maximize communication for safety, to reduce caregiver burden and improve QOL.   OBJECTIVE  IMPAIRMENTS: include aphasia. These impairments are limiting patient from return to work, managing medications, managing appointments, managing finances, ADLs/IADLs, and effectively communicating at home and in community. Factors affecting potential to achieve goals and functional outcome are co-morbidities and severity of impairments. Patient will benefit from skilled SLP services to address above impairments and improve overall function.  REHAB POTENTIAL: Good  PLAN:  SLP FREQUENCY: 2x/week  SLP DURATION: 8 weeks  PLANNED INTERVENTIONS: Language facilitation, Environmental controls, Cueing hierachy, Cognitive reorganization, Internal/external aids, Functional tasks, Multimodal communication approach,  SLP instruction and feedback, Compensatory strategies, Patient/family education, [phone] Treatment of speech (30 or 45 min) , and [id number]- Speech [address] [place], Phon, Eval Compre, Express    Mayra Brahm, Dareen Ebbing, CCC-SLP 10/26/2023, 11:57 AM

## 2023-10-28 ENCOUNTER — Ambulatory Visit: Admitting: Occupational Therapy

## 2023-10-28 ENCOUNTER — Ambulatory Visit: Admitting: Speech Pathology

## 2023-10-28 ENCOUNTER — Encounter: Payer: Self-pay | Admitting: Physical Therapy

## 2023-10-28 ENCOUNTER — Encounter: Payer: Self-pay | Admitting: Speech Pathology

## 2023-10-28 ENCOUNTER — Ambulatory Visit: Admitting: Physical Therapy

## 2023-10-28 VITALS — BP 159/75 | HR 57

## 2023-10-28 DIAGNOSIS — M6281 Muscle weakness (generalized): Secondary | ICD-10-CM | POA: Diagnosis not present

## 2023-10-28 DIAGNOSIS — R29818 Other symptoms and signs involving the nervous system: Secondary | ICD-10-CM

## 2023-10-28 DIAGNOSIS — R2689 Other abnormalities of gait and mobility: Secondary | ICD-10-CM

## 2023-10-28 DIAGNOSIS — R4701 Aphasia: Secondary | ICD-10-CM

## 2023-10-28 DIAGNOSIS — R41844 Frontal lobe and executive function deficit: Secondary | ICD-10-CM

## 2023-10-28 DIAGNOSIS — R278 Other lack of coordination: Secondary | ICD-10-CM

## 2023-10-28 DIAGNOSIS — C719 Malignant neoplasm of brain, unspecified: Secondary | ICD-10-CM

## 2023-10-28 DIAGNOSIS — R41841 Cognitive communication deficit: Secondary | ICD-10-CM

## 2023-10-28 DIAGNOSIS — R208 Other disturbances of skin sensation: Secondary | ICD-10-CM

## 2023-10-28 DIAGNOSIS — R41842 Visuospatial deficit: Secondary | ICD-10-CM

## 2023-10-28 DIAGNOSIS — I6300 Cerebral infarction due to thrombosis of unspecified precerebral artery: Secondary | ICD-10-CM

## 2023-10-28 NOTE — Therapy (Signed)
 OUTPATIENT SPEECH LANGUAGE PATHOLOGY APHASIA TREATMENT   Patient Name: Alan Wise MRN: [id number] DOB:[birth year], 53 y.o., male Today's Date: 10/28/2023  PCP: Carollynn Cirri, NP REFERRING PROVIDER: Celia Coles, MD  END OF SESSION:  End of Session - 10/28/23 1017     Visit Number 3    Number of Visits 17    Date for SLP Re-Evaluation 12/16/23    Authorization Type [employer]    Progress Note Due on Visit 10    SLP Start Time 1017    SLP Stop Time  1100    SLP Time Calculation (min) 43 min    Activity Tolerance Patient tolerated treatment well             Past Medical History:  Diagnosis Date   Allergy    Complication of anesthesia    pt reports he is starting to have more difficulty arousing after surgery   Diabetes mellitus (HCC)    GERD (gastroesophageal reflux disease)    Headache    History of kidney stones    Hyperlipidemia    Renal disorder    kidney stones   Past Surgical History:  Procedure Laterality Date   APPLICATION OF CRANIAL NAVIGATION Right 01/17/2021   Procedure: APPLICATION OF CRANIAL NAVIGATION;  Surgeon: Van Gelinas, MD;  Location: [facility] OR;  Service: Neurosurgery;  Laterality: Right;   COLONOSCOPY  09/03/1994   COLONOSCOPY WITH PROPOFOL  N/A 01/07/2023   Procedure: COLONOSCOPY WITH PROPOFOL ;  Surgeon: Marnee Sink, MD;  Location: [facility] ENDOSCOPY;  Service: Endoscopy;  Laterality: N/A;  NOT TOO EARLY   CYSTOSCOPY WITH STENT PLACEMENT Right 01/25/2016   Procedure: CYSTOSCOPY WITH STENT PLACEMENT;  Surgeon: Bart Born, MD;  Location: [facility] ORS;  Service: Urology;  Laterality: Right;   FRAMELESS  BIOPSY WITH BRAINLAB Right 01/17/2021   Procedure: RIGHT STEREOTACTIC BIOPSY OF INSULAR LESION;  Surgeon: Van Gelinas, MD;  Location: [facility] OR;  Service: Neurosurgery;  Laterality: Right;   KNEE ARTHROSCOPY W/ MENISCAL REPAIR Right 06/23/1988   POLYPECTOMY  01/07/2023   Procedure: POLYPECTOMY;  Surgeon: Marnee Sink, MD;   Location: [facility] ENDOSCOPY;  Service: Endoscopy;;   removal of birthmark  06/08/2013   SKIN CANCER EXCISION  06/23/2012   TYMPANOSTOMY TUBE PLACEMENT  08/06/1978   URETEROSCOPY WITH HOLMIUM LASER LITHOTRIPSY Right 01/25/2016   Procedure: URETEROSCOPY WITH HOLMIUM LASER LITHOTRIPSY;  Surgeon: Bart Born, MD;  Location: [facility] ORS;  Service: Urology;  Laterality: Right;   URETHRAL STRICTURE DILATATION     visual inspection of vocal cord     1973   WISDOM TOOTH EXTRACTION     Patient Active Problem List   Diagnosis Date Noted   Post-ictal aphasia 09/22/2023   CVA (cerebral vascular accident) (HCC) 09/20/2023   Abnormal finding on GI tract imaging 09/16/2023   Cerebrovascular accident (CVA) (HCC) 09/15/2023   Dizziness 09/15/2023   Dysphagia 09/14/2023   Gait disorder 09/14/2023   Seizure disorder (HCC) 09/10/2023   Uncontrolled type 2 diabetes mellitus with hypoglycemia, with long-term current use of insulin  (HCC) 09/10/2023   Acute CVA (cerebrovascular accident) (HCC) 09/10/2023   History of CVA (cerebrovascular accident) 09/09/2023   Essential hypertension 09/09/2023   Status epilepticus (HCC) 05/14/2023   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 year old male  who  has a past medical history of Allergy, Complication of anesthesia, Diabetes mellitus (HCC), GERD  (gastroesophageal reflux disease), Headache, History of kidney stones, Hyperlipidemia, and Renal disorder.  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  They presented to the hospital 09/14/23 with worsening gait, left-sided weakness, and new onset dysphagia.  Of note, he was recently hospitalized at home announce from 3-19 to 3-21 with new CVA and breakthrough seizures.  In the ER, he was started on IV fluids and MRI brain showed new punctate focus of restricted diffusion in the anterior insular cortex compatible with a new acute/subacute infarct, stable 8 mm acute/subacute infarct involving the right cerebral peduncle, stable restricted diffusion in the anterior right frontal lobe and along the atrium of the right lateral ventricle consistent with treated tumor. Known to use from Speech Evaluation prior to CVA 09/09/23, however no therapy started due to hospitalization 09/09/23.   PAIN:  Are you having pain? No                                                                                                                            TREATMENT DATE:   10/28/23: Targeted naming with Star Wars words - he generated 15 with frequent mod semantic and 1st letter cues. Targeted written expression writing the words with frequent mod A for error awareness and self correction. Targeted verbal compensations for aphasia generating 3-4 descriptions of 2 fast food restaurants ([employer] and [employer]) with frequent mod to max verbal, visual cues (looking at the menu) to generate descriptions as well as his orders - consistent extended time;  10/26/23: Alan Wise enters with communication notebook he and his wife have generated. He is requesting he words be moved to the right of the page rather than centered. Attempted [employer] Music therapist) - Alan Wise required max A (written choice of 2) to generate 3 subjects and verbs for the 1 verb (measure) as well as max A written or verbal  choice of 2 to answer "wh" questions to generate complex sentence with 1 set of subject/verb. Targeted verbal compensations for aphasia generated 3-4 descriptions of family members. With visual organizer of Name with 1-4 numbered below to support attention to task and with usual mod questioning cues and verbal cues, he generated 4 descriptions of his daughter. Targeted divergent naming in personally relevant category (ballroom dancing) with extended time and occasional min 1st letter or semantic cue, Alan Wise named 10 ballroom dances.   10/21/23: Evaluation completed - see above. Alan Wise brought communication notebook we started to the hospital however it was lost. Reviewed  what to include in communication support book - she agrees to re-print the pages, adding restaurant orders, bullet points about friends and family, places they frequent. Cued them to think about people, places, items the encounter daily, weekly and monthly - generated ideas and words for book - See patient instructions. Generated home practice of naming items around the home, children's activities, school subjects they study, upcoming activities. Instructed them to also practice the words in his communication support book as these are the most personally relevant. In  divergent naming task (school subjects) Alan Wise benefits from Topic and numbered visual cues to remain on task and avoid tangential speech/language. Spouse endorses they have a white board at home and will use this strategy during naming tasks at home.     PATIENT EDUCATION: Education details: See Treatment; See Patient Instructions, compensations for aphasia, communication supports, language activities to do at home Person educated: Patient and Spouse Education method: Explanation, Demonstration, Verbal cues, and Handouts Education comprehension: verbalized understanding, returned demonstration, verbal cues required, and needs further education   GOALS: Goals reviewed  with patient? Yes - 11/18/23 - target date Pt will use multimodal communication to communicate family names and biographical information with rare min A Baseline: Goal status: ONGOING   2.  Pt and spouse will generate 4 personally relevant places and orders to add to communication support book with occasional min A Baseline:  Goal status: ONGOING   3.  Pt will use multimodal communication to name 8 items in a personally relevant category with occasional min A Baseline:  Goal status: ONGOING   4.  Pt will use external aids to attend to the left when reading sentences  Baseline:  Goal status: ONGOING   5.  Pt will use verbal compensations for aphasia in structured tasks with occasional min A Baseline:  Goal status: ONGOING       LONG TERM GOALS: Target date: 12/16/23   Pt will use communication support notebook and scripting to place order at restaurant and to make 2 business calls Baseline:  Goal status: ONGOING   2.  Pt will use multimodal communication to communicate preferences in simple conversation Baseline:  Goal status: ONGOING   3.  Pt and spouse will carryover 3 compensatory strategies to support pt's auditory comprehension Baseline:  Goal status: ONGOING   4.  Pt will generate moderately complex sentences in structured task such as VNeST with usual min A Baseline:  Goal status:ONGOING   5.  Pt and spouse will carryover 1-2 language enhancing activities at home daily 5/7 days Baseline:  Goal status: ONGOING   6.  Pt and spouse will add 6 new topics/words to communication support notebook Baseline:  Goal status: ONGOING   ASSESSMENT:  CLINICAL IMPRESSION: Patient is a 53 y.o. male who was seen today for moderate aphasia c/b empty, vague language, halting for word finding difficulties. Auditory comprehension impairments noted in conversation gathering history. Written expression impaired at phrase level, spouse endorses texts often don't make sense. "He texts  like he speaks". Due to aphasia, Alan Wise plans to retire at the end of this semester. He had difficulty stating spouse and children's names and his address. He endorses significant frustration.  They do endorse some attempts at multimodal communication with inconsistent success. After prior evaluation, they generated communication support book, however this was lost.  I recommend skilled ST to maximize communication for safety, to reduce caregiver burden and improve QOL.   OBJECTIVE IMPAIRMENTS: include aphasia. These impairments are limiting patient from  return to work, managing medications, managing appointments, managing finances, ADLs/IADLs, and effectively communicating at home and in community. Factors affecting potential to achieve goals and functional outcome are co-morbidities and severity of impairments. Patient will benefit from skilled SLP services to address above impairments and improve overall function.  REHAB POTENTIAL: Good  PLAN:  SLP FREQUENCY: 2x/week  SLP DURATION: 8 weeks  PLANNED INTERVENTIONS: Language facilitation, Environmental controls, Cueing hierachy, Cognitive reorganization, Internal/external aids, Functional tasks, Multimodal communication approach, SLP instruction and feedback, Compensatory strategies, Patient/family education, [phone] Treatment of speech (30 or 45 min) , and [id number]- Speech Eval Sound Prod, Artic, Phon, Eval Compre, Express    Alan Wise, Alan Wise, CCC-SLP 10/28/2023, 12:11 PM

## 2023-10-28 NOTE — Therapy (Signed)
 OUTPATIENT PHYSICAL THERAPY NEURO TREATMENT    Patient Name: Alan Wise MRN: [id number] DOB:1971-04-22, 53 y.o., male Today's Date: 10/28/2023   PCP: Helayne Lo, NP REFERRING PROVIDER: Celia Coles, MD  END OF SESSION:  PT End of Session - 10/28/23 1101     Visit Number 2    Number of Visits 13    Date for PT Re-Evaluation 12/08/23    PT Start Time 1101    PT Stop Time 1145    PT Time Calculation (min) 44 min    Equipment Utilized During Treatment Gait belt    Activity Tolerance Patient tolerated treatment well    Behavior During Therapy WFL for tasks assessed/performed             Past Medical History:  Diagnosis Date   Allergy    Complication of anesthesia    pt reports he is starting to have more difficulty arousing after surgery   Diabetes mellitus (HCC)    GERD (gastroesophageal reflux disease)    Headache    History of kidney stones    Hyperlipidemia    Renal disorder    kidney stones   Past Surgical History:  Procedure Laterality Date   APPLICATION OF CRANIAL NAVIGATION Right 01/17/2021   Procedure: APPLICATION OF CRANIAL NAVIGATION;  Surgeon: Van Gelinas, MD;  Location: [facility] OR;  Service: Neurosurgery;  Laterality: Right;   COLONOSCOPY  09/03/1994   COLONOSCOPY WITH PROPOFOL  N/A 01/07/2023   Procedure: COLONOSCOPY WITH PROPOFOL ;  Surgeon: Marnee Sink, MD;  Location: [facility] ENDOSCOPY;  Service: Endoscopy;  Laterality: N/A;  NOT TOO EARLY   CYSTOSCOPY WITH STENT PLACEMENT Right 01/25/2016   Procedure: CYSTOSCOPY WITH STENT PLACEMENT;  Surgeon: Bart Born, MD;  Location: [facility] ORS;  Service: Urology;  Laterality: Right;   FRAMELESS  BIOPSY WITH BRAINLAB Right 01/17/2021   Procedure: RIGHT STEREOTACTIC BIOPSY OF INSULAR LESION;  Surgeon: Van Gelinas, MD;  Location: [facility] OR;  Service: Neurosurgery;  Laterality: Right;   KNEE ARTHROSCOPY W/ MENISCAL REPAIR Right 06/23/1988   POLYPECTOMY  01/07/2023   Procedure: POLYPECTOMY;  Surgeon:  Marnee Sink, MD;  Location: [facility] ENDOSCOPY;  Service: Endoscopy;;   removal of birthmark  06/08/2013   SKIN CANCER EXCISION  06/23/2012   TYMPANOSTOMY TUBE PLACEMENT  08/06/1978   URETEROSCOPY WITH HOLMIUM LASER LITHOTRIPSY Right 01/25/2016   Procedure: URETEROSCOPY WITH HOLMIUM LASER LITHOTRIPSY;  Surgeon: Bart Born, MD;  Location: [facility] ORS;  Service: Urology;  Laterality: Right;   URETHRAL STRICTURE DILATATION     visual inspection of vocal cord     1973   WISDOM TOOTH EXTRACTION     Patient Active Problem List   Diagnosis Date Noted   Post-ictal aphasia 09/22/2023   CVA (cerebral vascular accident) (HCC) 09/20/2023   Abnormal finding on GI tract imaging 09/16/2023   Cerebrovascular accident (CVA) (HCC) 09/15/2023   Dizziness 09/15/2023   Dysphagia 09/14/2023   Gait disorder 09/14/2023   Seizure disorder (HCC) 09/10/2023   Uncontrolled type 2 diabetes mellitus with hypoglycemia, with long-term current use of insulin  (HCC) 09/10/2023   Acute CVA (cerebrovascular accident) (HCC) 09/10/2023   History of CVA (cerebrovascular accident) 09/09/2023   Essential hypertension 09/09/2023   Status epilepticus (HCC) 05/14/2023   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/2021   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   Focal seizures (HCC) 12/31/2020   Type 2 diabetes mellitus with hyperglycemia (HCC) 06/12/2014   Hyperlipidemia associated with type 2 diabetes mellitus (  HCC) 06/12/2014    ONSET DATE: 08/23/23  REFERRING DIAG: I63.9 (ICD-10-CM) - Cerebral infarction, unspecified  THERAPY DIAG:  Muscle weakness (generalized)  Other abnormalities of gait and mobility  Rationale for Evaluation and Treatment: Rehabilitation  SUBJECTIVE:                                                                                                                                                                                             SUBJECTIVE  STATEMENT: Patient subjective limited by expressive aphasia.   Patient reports no falls and near falls since last here and no seizures as well. Patient otherwise doing well. Responds best to yes and no or clarifying questions during subjective.   Pt accompanied by: self and family member - mother [name]  PERTINENT HISTORY: past medical history of Allergy, Complication of anesthesia, Diabetes mellitus (HCC), GERD (gastroesophageal reflux disease), Headache, History of kidney stones, Hyperlipidemia, and Renal disorder. 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  PAIN:  Are you having pain? No  PRECAUTIONS: Fall and Other: Seizures  and L visual cut   RED FLAGS: None   WEIGHT BEARING RESTRICTIONS: No  FALLS: Has patient fallen in last 6 months? Yes. Number of falls 2  LIVING ENVIRONMENT: Lives with: lives with their spouse Lives in: House/apartment Stairs: Yes: Internal: 15 steps; rail on L for 5 steps, landing, and 10 steps going up with rail on R and External: 3 steps; on right going up, on left going up, and bilateral but cannot reach both Has following equipment at home: None  PLOF: Independent  PATIENT GOALS: Improve walking, balance  OBJECTIVE:  Note: Objective measures were completed at Evaluation unless otherwise noted.  COGNITION: Overall cognitive status: Impaired   SENSATION: WFL   LOWER EXTREMITY ROM:     Active  Right Eval Left Eval  Hip flexion    Hip extension    Hip abduction    Hip adduction    Hip internal rotation    Hip external rotation    Knee flexion    Knee extension    Ankle dorsiflexion    Ankle plantarflexion    Ankle inversion    Ankle eversion     (Blank rows = not tested)  LOWER EXTREMITY MMT:    MMT Right Eval Left Eval  Hip flexion 5 4  Hip extension    Hip abduction    Hip adduction    Hip internal rotation    Hip external rotation    Knee flexion 5 4  Knee extension 5 5  Ankle  dorsiflexion 5 4  Ankle plantarflexion  Ankle inversion    Ankle eversion    (Blank rows = not tested)  BED MOBILITY:  Findings: Sit to supine Complete Independence Supine to sit Complete Independence Rolling to Right Complete Independence Rolling to Left Complete Independence  TRANSFERS: Sit to stand: Complete Independence  Assistive device utilized: None     Stand to sit: Complete Independence  Assistive device utilized: None      STAIRS: Findings: Level of Assistance: SBA, Stair Negotiation Technique: Step to Pattern with Single Rail on Right, Number of Stairs: 4, Height of Stairs: 6"   , and Comments: reciprocal pattern when ascending and step to pattern when descending stairs. GAIT: Findings: Gait Characteristics: decreased arm swing- Right, decreased arm swing- Left, decreased step length- Right, decreased step length- Left, and poor foot clearance- Left, Distance walked: 230', Assistive device utilized:None, Level of assistance: CGA  FUNCTIONAL TESTS:  5 times sit to stand: 34 sec without UE support (4/22) Timed up and go (TUG): 13 sec without AD (4/22) Functional gait assessment: 20/30                                                                                           TREATMENT DATE:    Self Care: Vitals:   10/28/23 1107  BP: (!) 159/75  Pulse: (!) 57   - Vitals assessed on RUE; elevated readings; recommend close monitoring at home - Patient expresses desire repeated to work on UE during session, PT explains OT primarily addressing this deficit   TherAct:   Functional movement training  Dead lift with cues on hip hinging x 10 unweighted, 2 x 10 reps holding 12lb kettle bell  Sit to stand with wedge board 1 x 10 unweighted, 2 x 6 reps with 12lb kettle bell, 1 x 10 without wedge board x 10 reps with 12lb kettle bell Cueing anterior weight shift  Tap onto second step into mini lunge forward holding initally 2 lb dumbells and then regressed to 1 with chest hold  (stepped up fully not understanding instructions with minor impulsivity but otherwise corrected well) 2 x 10 reps, x 10 reps quick tap only with 5lb kettle bell hold Reviewed HEP  PATIENT EDUCATION: Education details: Initial HEP Person educated: Patient and Parent Education method: Explanation, Demonstration, and Handouts Education comprehension: verbalized understanding and needs further education  HOME EXERCISE PROGRAM: Access Code: [email or web address] URL: [email or web address] Date: 10/28/2023 Prepared by: Camella Cave  Exercises - Half Deadlift with Kettlebell  - 1 x daily - 4 x weekly - 3 sets - 10 reps - Sit to Stand Without Arm Support  - 1 x daily - 4 x weekly - 3 sets - 10 reps  GOALS: Goals reviewed with patient? Yes  SHORT TERM GOALS: Target date: 11/10/2023  Pt will demo >50% compliance with HEP to self manage symptoms. Baseline: TBD Goal status: INITIAL   LONG TERM GOALS: Target date: 12/08/2023   Patient will demo 5x sit to stand score of <16 sec to improve overall functional strength. Baseline: 34 s without UE support Goal status: INITIAL  2.  Pt will demo functional gait assessment score of >24/30 to improve overall  balance with functional gait. Baseline: 20/30 Goal status: INITIAL  3.  Pt will be able to perform reciprocal gait pattern when descending stairs with use of one rail Baseline: step to pattern when descending (leading with L LE) with one rail (10/13/23) Goal status: INITIAL  4.  Patient will demo gait speed of >0.90 m/s to improve functional mobility in community. Baseline: TBD, 1.3 m/s  Goal status: MET  5.  Pt will demo improved overall safety awareness and decision making to avoid risk for fall and report no falls during course of 4 weeks consecutively. Baseline:  Goal status: INITIAL   ASSESSMENT:  CLINICAL IMPRESSION: Patient tolerated session well; patient is limited by expressive aphasia and demonstrates mild L side  weakness. Most challenge with anterior weight shift but improves with use of wedge board. Recommend reviewing HEP next session to ensure understanding. A few instances of impulsivity when not understanding instructions fully before understanding tasks but improves with visual and tactile cues.Continue POC.  OBJECTIVE IMPAIRMENTS: Abnormal gait, decreased activity tolerance, decreased balance, decreased endurance, decreased knowledge of condition, decreased mobility, difficulty walking, decreased strength, decreased safety awareness, impaired perceived functional ability, impaired tone, impaired UE functional use, and impaired vision/preception.   ACTIVITY LIMITATIONS: carrying, lifting, bending, standing, squatting, stairs, transfers, and locomotion level  PARTICIPATION LIMITATIONS: meal prep, cleaning, laundry, driving, shopping, community activity, occupation, and yard work  PERSONAL FACTORS: Time since onset of injury/illness/exacerbation and 3+ comorbidities: brain tumor, CVA, seizures  are also affecting patient's functional outcome.   REHAB POTENTIAL: Good  CLINICAL DECISION MAKING: Stable/uncomplicated  EVALUATION COMPLEXITY: Low  PLAN:  PT FREQUENCY: 1-2x/week  PT DURATION: 8 weeks  PLANNED INTERVENTIONS: 97164- PT Re-evaluation, 97750- Physical Performance Testing, 97110-Therapeutic exercises, 97530- Therapeutic activity, 97112- Neuromuscular re-education, 97535- Self Care, 91478- Manual therapy, [phone]- Gait training, Patient/Family education, Balance training, Stair training, Joint mobilization, Visual/preceptual remediation/compensation, DME instructions, Cryotherapy, and Moist heat  PLAN FOR NEXT SESSION: work on anterior weight shift with transfers, review HEP, single leg stance tasks, could trial floor recovery and tall vs half kneel   Planning to D/C on Friday or add more as last scheduled visit    Coreen Devoid, PT, DPT 10/28/2023, 12:28 PM

## 2023-10-28 NOTE — Therapy (Signed)
 OUTPATIENT OCCUPATIONAL THERAPY NEURO TREATMENT  Patient Name: Alan Wise MRN: [id number] DOB:[birth year], 53 y.o., male Today's Date: 10/28/2023  PCP: Carollynn Cirri, NP REFERRING PROVIDER: Celia Coles, MD   END OF SESSION:  OT End of Session - 10/28/23 1200     Visit Number 4    Number of Visits 16    Date for OT Re-Evaluation 12/18/23    Authorization Type [employer] 2025 OOPM MET    Authorization Time Period Covered 100% VL:? Auth:?    OT Start Time 1152    OT Stop Time 1230    OT Time Calculation (min) 38 min    Activity Tolerance Patient tolerated treatment well    Behavior During Therapy WFL for tasks assessed/performed              Past Medical History:  Diagnosis Date   Allergy    Complication of anesthesia    pt reports he is starting to have more difficulty arousing after surgery   Diabetes mellitus (HCC)    GERD (gastroesophageal reflux disease)    Headache    History of kidney stones    Hyperlipidemia    Renal disorder    kidney stones   Past Surgical History:  Procedure Laterality Date   APPLICATION OF CRANIAL NAVIGATION Right 01/17/2021   Procedure: APPLICATION OF CRANIAL NAVIGATION;  Surgeon: Van Gelinas, MD;  Location: [facility] OR;  Service: Neurosurgery;  Laterality: Right;   COLONOSCOPY  09/03/1994   COLONOSCOPY WITH PROPOFOL  N/A 01/07/2023   Procedure: COLONOSCOPY WITH PROPOFOL ;  Surgeon: Marnee Sink, MD;  Location: [facility] ENDOSCOPY;  Service: Endoscopy;  Laterality: N/A;  NOT TOO EARLY   CYSTOSCOPY WITH STENT PLACEMENT Right 01/25/2016   Procedure: CYSTOSCOPY WITH STENT PLACEMENT;  Surgeon: Bart Born, MD;  Location: [facility] ORS;  Service: Urology;  Laterality: Right;   FRAMELESS  BIOPSY WITH BRAINLAB Right 01/17/2021   Procedure: RIGHT STEREOTACTIC BIOPSY OF INSULAR LESION;  Surgeon: Van Gelinas, MD;  Location: [facility] OR;  Service: Neurosurgery;  Laterality: Right;   KNEE ARTHROSCOPY W/ MENISCAL REPAIR Right 06/23/1988    POLYPECTOMY  01/07/2023   Procedure: POLYPECTOMY;  Surgeon: Marnee Sink, MD;  Location: [facility] ENDOSCOPY;  Service: Endoscopy;;   removal of birthmark  06/08/2013   SKIN CANCER EXCISION  06/23/2012   TYMPANOSTOMY TUBE PLACEMENT  08/06/1978   URETEROSCOPY WITH HOLMIUM LASER LITHOTRIPSY Right 01/25/2016   Procedure: URETEROSCOPY WITH HOLMIUM LASER LITHOTRIPSY;  Surgeon: Bart Born, MD;  Location: [facility] ORS;  Service: Urology;  Laterality: Right;   URETHRAL STRICTURE DILATATION     visual inspection of vocal cord     1973   WISDOM TOOTH EXTRACTION     Patient Active Problem List   Diagnosis Date Noted   Post-ictal aphasia 09/22/2023   CVA (cerebral vascular accident) (HCC) 09/20/2023   Abnormal finding on GI tract imaging 09/16/2023   Cerebrovascular accident (CVA) (HCC) 09/15/2023   Dizziness 09/15/2023   Dysphagia 09/14/2023   Gait disorder 09/14/2023   Seizure disorder (HCC) 09/10/2023   Uncontrolled type 2 diabetes mellitus with hypoglycemia, with long-term current use of insulin  (HCC) 09/10/2023   Acute CVA (cerebrovascular accident) (HCC) 09/10/2023   History of CVA (cerebrovascular accident) 09/09/2023   Essential hypertension 09/09/2023   Status epilepticus (HCC) 05/14/2023   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/2021   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   Focal seizures (HCC) 12/31/2020   Type 2 diabetes mellitus with hyperglycemia (  HCC) 06/12/2014   Hyperlipidemia associated with type 2 diabetes mellitus (HCC) 06/12/2014    ONSET DATE: Referral: 09/23/2023  Hospitalization 3/30 - 09/25/23  REFERRING DIAG: I63.9 (ICD-10-CM) - Cerebral infarction, unspecified R frontal oligodendroglioma  THERAPY DIAG:  Muscle weakness (generalized)  Aphasia  Other lack of coordination  Other disturbances of skin sensation  Other symptoms and signs involving the nervous system  Visuospatial deficit  Frontal lobe and  executive function deficit  Cerebrovascular accident (CVA) due to thrombosis of precerebral artery (HCC)  Cognitive communication deficit  Oligodendroglioma (HCC)  Rationale for Evaluation and Treatment: Rehabilitation  SUBJECTIVE:   SUBJECTIVE STATEMENT: Pt reports he was unable to complete word searches before his stroke. Unable to explain.   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  Presented to the hospital 09/14/2023 with worsening gait left-sided weakness/aphasia and new onset dysphagia.  Of note, he was recently hospitalized from 3-19 to 3-21 with new CVA and breakthrough seizures.  In the ED he was started on IV fluids and MRI of the brain showed new punctate focus of restricted diffusion in the anterior insular cortex compatible with new acute/subacute infarct, stable 8 mm acute/subacute infarct involving the right cerebral peduncle, stable restricted diffusion in the anterior frontal lobe and along the atrium of the right lateral ventricle consistent with treated tumor, improved diffusion changes within the genu of the corpus callosum suggesting these were ischemic, stable chronic encephalomalacia of the right frontal operculum, and stable periventricular T2 hyperintensities in the left hemisphere likely reflects the sequela of chronic microvascular ischemia. He was started on Eliquis , AEDs were adjusted and admitted with evaluation by neurology and Dr. Mark Sil with neuro-oncology, because of recent decline determined to be multifocal secondary to radiation necrosis, breakthrough seizures and multiple recent strokes.  Hospitalization complicated by dysphagia and GI determined to be neurologic.  Last known seizure 09/16/23, with subsequent increase nightly Lamictal .  Therapy evaluations completed due to patient decreased  functional mobility was admitted for a comprehensive rehab program.  PRECAUTIONS: Fall  WEIGHT BEARING RESTRICTIONS: No  PAIN:  Are you having pain? No  FALLS: Has patient fallen in last 6 months? Yes. Number of falls 3 - trying to put pants on, ladder before stroke, carrying cat litter and lost balance on steps  LIVING ENVIRONMENT:  Home Living/Prior Functioning Home Living: Private residence Living Arrangements: Spouse - [name], Children - daughter [name], son 5yo Available Help at Discharge: Family, Available 24 hours/day Type of Home: House Home Access: Stairs to enter (stone steps) Entrance Stairs-Number of Steps: 4 Entrance Stairs-Rails: Right, Left Home Layout: Two level, Able to live on main level with bedroom/bathroom (can live on main level) Alternate Level Stairs-Number of Steps: flight of steps to 2nd floor bedroom Alternate Level Stairs-Rails:  (L for first 5 and R on second 5) Bathroom Shower/Tub: Psychologist, counselling, Sport and exercise psychologist: Standard Bathroom Accessibility: Yes IADL History Homemaking Responsibilities: No Prior Function/Level of Independence: Independent with basic ADLs, Independent with transfers, Independent with gait, Independent with homemaking with ambulation Able to Take Stairs?: Yes Driving: No (not driving for several years due to seizures)  PLOF: Independent with basic ADLs and Independent with household mobility without device Prior interests: Wrestled, ball room dance, soccer, professor of mathematics at [employer]  PATIENT GOALS: Pt reported he needs to work on L side awareness, vision on left side, strength and putting jacket  on.  OBJECTIVE:  Note: Objective measures were completed at Evaluation unless otherwise noted.  HAND DOMINANCE: Left  ADLs: Overall ADLs: Min assist to Mod Ind Transfers/ambulation related to ADLs: Mod Ind Eating: Pt switched to R hand for eating, wife helps cut food Grooming: Wife used clippers recently UB Dressing:  Reports he struggles with button up shirt/jacket,  LB Dressing: Pt spouse report he fell with LB dressing in standing, wife confirms he can tie shoelaces Toileting: Ind Bathing: Mod I Tub Shower transfers: walk in shower with built in bench with B showerheads Equipment: none  IADLs: Shopping: wife Light housekeeping: wife Meal Prep: wife Community mobility: Supervision Medication management: Wife assists Financial management: Wife Handwriting: Increased time - due to aphasia (June 2024 aphasia, speech, reading)  MOBILITY STATUS: Hx of falls  POSTURE COMMENTS:  rounded shoulders and forward head Sitting balance: WFL  ACTIVITY TOLERANCE: Activity tolerance: Fair  FUNCTIONAL OUTCOME MEASURES: TBA  UPPER EXTREMITY ROM:    Active ROM Right eval Left eval  Shoulder flexion 120 90  Shoulder abduction    Shoulder adduction    Shoulder extension    Shoulder internal rotation    Shoulder external rotation    Elbow flexion WNL WFL  Elbow extension WNL WFL  Wrist flexion  Sight limits  Wrist extension    Wrist ulnar deviation    Wrist radial deviation    Wrist pronation    Wrist supination    (Blank rows = not tested)  UPPER EXTREMITY MMT:     MMT Right eval Left eval  Shoulder flexion 4 3-  Shoulder abduction    Shoulder adduction    Shoulder extension    Shoulder internal rotation    Shoulder external rotation    Middle trapezius    Lower trapezius    Elbow flexion 5 4+  Elbow extension 4+ 4  Wrist flexion    Wrist extension    Wrist ulnar deviation    Wrist radial deviation    Wrist pronation    Wrist supination    (Blank rows = not tested)  HAND FUNCTION: Grip strength: Right: 53.5, 57.5, 53.3  lbs; Left: 42.9, 51.8, 41.8  lbs Right: 54.8 lbs Left: 45.5 lbs  COORDINATION: 9 Hole Peg test: Right: 41.24  sec; Left: 1:04.00 sec  SENSATION: Patient initially reported normal but then noted he may have some limtations ie) does not know   EDEMA:  NA  MUSCLE TONE: Some changes ie) not as smooth with movements and coordination  COGNITION: Overall cognitive status: Impaired/Aphasia impacts cognition Hospital: [facility] Summary Score: 12   VISION: Subjective report: January - vision was decreasing Baseline vision: Wears glasses for reading only about 1 year Visual history: changes were happening perhaps a precursor to CVA  MR review: Vision Baseline Vision/History: 1 Wears glasses Ability to See in Adequate Light: 1 Impaired Patient Visual Report: Peripheral vision impairment Vision Assessment?: Wears glasses for reading Eye Alignment: Within Functional Limits Alignment/Gaze Preference: Within Defined Limits Visual Fields: Left homonymous hemianopsia Additional Comments: L peripheral vision decr since January  VISION ASSESSMENT: Tracking/Visual pursuits: Impaired - pt has trouble with vision on L side  Patient has difficulty with following activities due to following visual impairments: walks into walls, chairs etc, ie) doesn't always scan especially if he is tired, agitated, etc  10/22/23 -  R side peripheral vision: approx. 70* L side peripheral vision: visual field cut, unable to see marker until midline.  Pt reported difficulty seeing objects on L side.  PERCEPTION: Not tested  PRAXIS: Not tested                                                                                                                           TREATMENT :    TherAct Pt completed large print word Search. Pt found 3 words requiring max cueing for use of vision strategies and to turn head to look to the left.   Pt placed then removed 123 clips with LUE according to pattern on card for improved fine motor coordination, strength, visual discrimination, and cognition. Pt required min cues and increased time for proper completion including cues to only use L hand for manipulation of clips.   PATIENT EDUCATION: Education details: Vision strategies;  functional tasks Person educated: Patient and Parent Education method: Explanation, Demonstration, Tactile cues, and Verbal cues Education comprehension: verbalized understanding, returned demonstration, verbal cues required, tactile cues required, and needs further education  HOME EXERCISE PROGRAM: 10/20/2023: scanning activities; vision strategies  GOALS: Goals reviewed with patient? Yes  SHORT TERM GOALS: Target date: 11/20/23  Patient will demonstrate initial UE HEP with 25% verbal cues or less for proper execution (d/t B shoulder limitations and L sided weakness s/p CVA). Baseline: New to outpt OT Goal status: INITIAL   2.  Patient will demonstrate at least 5+ lbs improvement for BUE grip strength as needed to open jars and other containers. Baseline: Right: 54.8 lbs Left: 45.5 lbs Goal status: INITIAL   3.  Patient will demo improved FM coordination as evidenced by completing nine-hole peg 5+ seconds faster with use of BUE.  Baseline: 9 Hole Peg test: Right: 41.24  sec; Left: 1:04.00 sec Goal status: INITIAL   4.  Patient will independently recall at least 2 compensatory strategies for visual impairment without cueing. Baseline: New to outpt OT - bumps into furniture Goal status: INITIAL   5. Pt will verbalize understanding of modifications, strategies and/or equipment PRN with visual cues as needed to increase safety and independence with ADLs and IADLs (I.e. with decreased fall risk). Baseline: New to outpt OT - fell donning pants Goal Status: INITIAL   LONG TERM GOALS: Target date: 12/18/23   Patient will demonstrate updated UE HEP with visual handouts only for proper execution.  Baseline: New to outpt OT  Goal status: INITIAL   2.  Patient will return demonstration of visual adaptation use and verbalize understanding of how to obtain item(s) if desired.  Baseline: New to outpt OT Goal status: INITIAL   3.  Patient will demo improved FM coordination as evidenced by  completing nine-hole peg with use of BUE as close to 30 seconds as possible.  Baseline: 9 Hole Peg test: Right: 41.24  sec; Left: 1:04.00 sec Goal status: INITIAL   4.  Pt will write/print his name/initials. Baseline: difficult due to aphasia by report Goal status: INITIAL   5.  Pt will report increased ease with donning all clothing items - shirt, socks/shoes without assistance of  spouse Baseline: occasional assistance of spouse Goal status: INITIAL  ASSESSMENT:  CLINICAL IMPRESSION: Pt requiring encouragement to complete tasks as instructed despite challenge to vision on use of LUE as needed to progress towards goals. Pt's limited insight complicated by cognitive and expressive limitations.   PERFORMANCE DEFICITS: in functional skills including ADLs, IADLs, coordination, dexterity, proprioception, sensation, tone, ROM, strength, pain, fascial restrictions, muscle spasms, flexibility, Fine motor control, Gross motor control, mobility, balance, body mechanics, endurance, decreased knowledge of precautions, decreased knowledge of use of DME, vision, and UE functional use, cognitive skills including attention, emotional, energy/drive, learn, memory, perception, problem solving, safety awareness, sequencing, temperament/personality, thought, and understand, and psychosocial skills including coping strategies, environmental adaptation, habits, interpersonal interactions, and routines and behaviors.   IMPAIRMENTS: are limiting patient from ADLs, IADLs, rest and sleep, education, work, play, leisure, and social participation.   CO-MORBIDITIES: has co-morbidities such as h/o frontal oligodendroglioma   that affects occupational performance. Patient will benefit from skilled OT to address above impairments and improve overall function.  REHAB POTENTIAL: Good  PLAN:  OT FREQUENCY: 1-2x/week  OT DURATION: 8 weeks  PLANNED INTERVENTIONS: 97535 self care/ADL training, 81191 therapeutic exercise,  97530 therapeutic activity, 97112 neuromuscular re-education, 97140 manual therapy, balance training, functional mobility training, visual/perceptual remediation/compensation, psychosocial skills training, energy conservation, coping strategies training, patient/family education, and DME and/or AE instructions  RECOMMENDED OTHER SERVICES: NA - other therapies already scheduled at this time  CONSULTED AND AGREED WITH PLAN OF CARE: Patient and family member/caregiver  PLAN FOR NEXT SESSION:  Shoulder HEP  More thorough evaluation of vision  Visual scanning activities - encourage L side awareness (balloon + scanning) Review theraputty HEP HEP update: UE ROM (sh) and lower-level coordination (2-4 exercises) ADL comp strategies   Altamease Asters, OT 10/28/2023, 12:04 PM

## 2023-10-30 ENCOUNTER — Ambulatory Visit

## 2023-10-30 ENCOUNTER — Encounter: Admitting: Occupational Therapy

## 2023-11-02 ENCOUNTER — Other Ambulatory Visit

## 2023-11-02 ENCOUNTER — Ambulatory Visit: Admitting: Internal Medicine

## 2023-11-02 ENCOUNTER — Ambulatory Visit

## 2023-11-04 ENCOUNTER — Ambulatory Visit

## 2023-11-04 ENCOUNTER — Encounter: Payer: Self-pay | Admitting: Speech Pathology

## 2023-11-04 ENCOUNTER — Other Ambulatory Visit: Payer: Self-pay | Admitting: Internal Medicine

## 2023-11-04 ENCOUNTER — Ambulatory Visit: Admitting: Occupational Therapy

## 2023-11-04 ENCOUNTER — Ambulatory Visit: Admitting: Speech Pathology

## 2023-11-04 VITALS — BP 157/82 | HR 61

## 2023-11-04 DIAGNOSIS — M6281 Muscle weakness (generalized): Secondary | ICD-10-CM | POA: Diagnosis not present

## 2023-11-04 DIAGNOSIS — R278 Other lack of coordination: Secondary | ICD-10-CM

## 2023-11-04 DIAGNOSIS — R2689 Other abnormalities of gait and mobility: Secondary | ICD-10-CM

## 2023-11-04 DIAGNOSIS — I6300 Cerebral infarction due to thrombosis of unspecified precerebral artery: Secondary | ICD-10-CM

## 2023-11-04 DIAGNOSIS — R41842 Visuospatial deficit: Secondary | ICD-10-CM

## 2023-11-04 DIAGNOSIS — R41844 Frontal lobe and executive function deficit: Secondary | ICD-10-CM

## 2023-11-04 DIAGNOSIS — R29818 Other symptoms and signs involving the nervous system: Secondary | ICD-10-CM

## 2023-11-04 DIAGNOSIS — R4701 Aphasia: Secondary | ICD-10-CM

## 2023-11-04 DIAGNOSIS — R208 Other disturbances of skin sensation: Secondary | ICD-10-CM

## 2023-11-04 NOTE — Therapy (Signed)
 OUTPATIENT PHYSICAL THERAPY NEURO TREATMENT    Patient Name: Alan Wise MRN: [id number] DOB:Jan 02, 1971, 53 y.o., male Today's Date: 11/04/2023   PCP: Helayne Lo, NP REFERRING PROVIDER: Celia Coles, MD  END OF SESSION:  PT End of Session - 11/04/23 1102     Visit Number 4    Number of Visits 13    Date for PT Re-Evaluation 12/08/23    Authorization Type [employer]    PT Start Time 1101    PT Stop Time 1148    PT Time Calculation (min) 47 min    Equipment Utilized During Treatment Gait belt    Activity Tolerance Patient tolerated treatment well    Behavior During Therapy WFL for tasks assessed/performed;Agitated             Past Medical History:  Diagnosis Date   Allergy    Complication of anesthesia    pt reports he is starting to have more difficulty arousing after surgery   Diabetes mellitus (HCC)    GERD (gastroesophageal reflux disease)    Headache    History of kidney stones    Hyperlipidemia    Renal disorder    kidney stones   Past Surgical History:  Procedure Laterality Date   APPLICATION OF CRANIAL NAVIGATION Right 01/17/2021   Procedure: APPLICATION OF CRANIAL NAVIGATION;  Surgeon: Van Gelinas, MD;  Location: [facility] OR;  Service: Neurosurgery;  Laterality: Right;   COLONOSCOPY  09/03/1994   COLONOSCOPY WITH PROPOFOL  N/A 01/07/2023   Procedure: COLONOSCOPY WITH PROPOFOL ;  Surgeon: Marnee Sink, MD;  Location: [facility] ENDOSCOPY;  Service: Endoscopy;  Laterality: N/A;  NOT TOO EARLY   CYSTOSCOPY WITH STENT PLACEMENT Right 01/25/2016   Procedure: CYSTOSCOPY WITH STENT PLACEMENT;  Surgeon: Bart Born, MD;  Location: [facility] ORS;  Service: Urology;  Laterality: Right;   FRAMELESS  BIOPSY WITH BRAINLAB Right 01/17/2021   Procedure: RIGHT STEREOTACTIC BIOPSY OF INSULAR LESION;  Surgeon: Van Gelinas, MD;  Location: [facility] OR;  Service: Neurosurgery;  Laterality: Right;   KNEE ARTHROSCOPY W/ MENISCAL REPAIR Right 06/23/1988   POLYPECTOMY   01/07/2023   Procedure: POLYPECTOMY;  Surgeon: Marnee Sink, MD;  Location: [facility] ENDOSCOPY;  Service: Endoscopy;;   removal of birthmark  06/08/2013   SKIN CANCER EXCISION  06/23/2012   TYMPANOSTOMY TUBE PLACEMENT  08/06/1978   URETEROSCOPY WITH HOLMIUM LASER LITHOTRIPSY Right 01/25/2016   Procedure: URETEROSCOPY WITH HOLMIUM LASER LITHOTRIPSY;  Surgeon: Bart Born, MD;  Location: [facility] ORS;  Service: Urology;  Laterality: Right;   URETHRAL STRICTURE DILATATION     visual inspection of vocal cord     1973   WISDOM TOOTH EXTRACTION     Patient Active Problem List   Diagnosis Date Noted   Post-ictal aphasia 09/22/2023   CVA (cerebral vascular accident) (HCC) 09/20/2023   Abnormal finding on GI tract imaging 09/16/2023   Cerebrovascular accident (CVA) (HCC) 09/15/2023   Dizziness 09/15/2023   Dysphagia 09/14/2023   Gait disorder 09/14/2023   Seizure disorder (HCC) 09/10/2023   Uncontrolled type 2 diabetes mellitus with hypoglycemia, with long-term current use of insulin  (HCC) 09/10/2023   Acute CVA (cerebrovascular accident) (HCC) 09/10/2023   History of CVA (cerebrovascular accident) 09/09/2023   Essential hypertension 09/09/2023   Status epilepticus (HCC) 05/14/2023   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/2021   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   Focal seizures (HCC) 12/31/2020   Type 2 diabetes mellitus with hyperglycemia (HCC) 06/12/2014  Hyperlipidemia associated with type 2 diabetes mellitus (HCC) 06/12/2014    ONSET DATE: 08/23/23  REFERRING DIAG: I63.9 (ICD-10-CM) - Cerebral infarction, unspecified  THERAPY DIAG:  Muscle weakness (generalized)  Other lack of coordination  Other abnormalities of gait and mobility  Rationale for Evaluation and Treatment: Rehabilitation  SUBJECTIVE:                                                                                                                                                                                              SUBJECTIVE STATEMENT: Patient reports doing well. Did have a few "wobbles" at home per wife. Denies falls.   Pt accompanied by: self and family member - mother [name]  PERTINENT HISTORY: past medical history of Allergy, Complication of anesthesia, Diabetes mellitus (HCC), GERD (gastroesophageal reflux disease), Headache, History of kidney stones, Hyperlipidemia, and Renal disorder. 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  PAIN:  Are you having pain? No  PRECAUTIONS: Fall and Other: Seizures and L visual cut    PATIENT GOALS: Improve walking, balance                                                                                            TREATMENT DATE:    Self Care: Vitals:   11/04/23 1107  BP: (!) 157/82  Pulse: 61    TherAct:  -ambulatory visual compensation trying to spot playing cards around the gym   -required Mod verbal cues to accurately find all cards with poor compensation for L visual field cut  -pulling squigz off mirror with simple dual cog task naming colors  -progressed to 2nd order being told which color to pull off  -progressed to above + on A/P rockerboard with CGA -230' carrying Surge  -x8 deadlift with Surge  Self care/home management: -extensive conversation re: return to driving and safety at home  -PT recommending 24/7 supervision   PATIENT EDUCATION: Education details: continue HEP, see above, PT recommending 24/7 supervision Person educated: Patient and Parent Education method: Explanation, Demonstration, and Handouts Education comprehension: verbalized understanding and needs further education  HOME EXERCISE PROGRAM: Access Code: [email or web address] URL: [email or web address] Date: 10/28/2023 Prepared by: Camella Cave  Exercises - Half Deadlift with Kettlebell  - 1 x daily - 4  x weekly - 3 sets - 10 reps - Sit to Stand Without Arm Support   - 1 x daily - 4 x weekly - 3 sets - 10 reps  GOALS: Goals reviewed with patient? Yes  SHORT TERM GOALS: Target date: 11/10/2023  Pt will demo >50% compliance with HEP to self manage symptoms. Baseline: TBD Goal status: INITIAL   LONG TERM GOALS: Target date: 12/08/2023   Patient will demo 5x sit to stand score of <16 sec to improve overall functional strength. Baseline: 34 s without UE support Goal status: INITIAL  2.  Pt will demo functional gait assessment score of >24/30 to improve overall balance with functional gait. Baseline: 20/30 Goal status: INITIAL  3.  Pt will be able to perform reciprocal gait pattern when descending stairs with use of one rail Baseline: step to pattern when descending (leading with L LE) with one rail (10/13/23) Goal status: INITIAL  4.  Patient will demo gait speed of >0.90 m/s to improve functional mobility in community. Baseline: TBD, 1.3 m/s  Goal status: MET  5.  Pt will demo improved overall safety awareness and decision making to avoid risk for fall and report no falls during course of 4 weeks consecutively. Baseline:  Goal status: INITIAL   ASSESSMENT:  CLINICAL IMPRESSION: Patient seen for skilled PT session with emphasis on ambulatory balance and patient education. He is grossly limited by poor safety awareness and insight into deficits. Patient increasing in agitation toward end of session as PT reiterated that he cannot drive and cannot safely carry items up and down the stairs, especially alone. Wife verbalized understanding, patient continues to require education. Continue POC.   OBJECTIVE IMPAIRMENTS: Abnormal gait, decreased activity tolerance, decreased balance, decreased endurance, decreased knowledge of condition, decreased mobility, difficulty walking, decreased strength, decreased safety awareness, impaired perceived functional ability, impaired tone, impaired UE functional use, and impaired vision/preception.   ACTIVITY  LIMITATIONS: carrying, lifting, bending, standing, squatting, stairs, transfers, and locomotion level  PARTICIPATION LIMITATIONS: meal prep, cleaning, laundry, driving, shopping, community activity, occupation, and yard work  PERSONAL FACTORS: Time since onset of injury/illness/exacerbation and 3+ comorbidities: brain tumor, CVA, seizures are also affecting patient's functional outcome.   REHAB POTENTIAL: Good  CLINICAL DECISION MAKING: Stable/uncomplicated  EVALUATION COMPLEXITY: Low  PLAN:  PT FREQUENCY: 1-2x/week  PT DURATION: 8 weeks  PLANNED INTERVENTIONS: 97164- PT Re-evaluation, 97750- Physical Performance Testing, 97110-Therapeutic exercises, 97530- Therapeutic activity, 97112- Neuromuscular re-education, 97535- Self Care, 16109- Manual therapy, [phone]- Gait training, Patient/Family education, Balance training, Stair training, Joint mobilization, Visual/preceptual remediation/compensation, DME instructions, Cryotherapy, and Moist heat  PLAN FOR NEXT SESSION: work on anterior weight shift with transfers, review HEP, single leg stance tasks, could trial floor recovery and tall vs half kneel, dual tasking  Planning to D/C on Friday or add more as last scheduled visit    Rebecca Campus, PT Rebecca Campus, PT, DPT, CBIS  11/04/2023, 12:12 PM

## 2023-11-04 NOTE — Therapy (Signed)
 OUTPATIENT SPEECH LANGUAGE PATHOLOGY APHASIA TREATMENT   Patient Name: Alan Wise MRN: [id number] DOB:March 15, 1971, 53 y.o., male Today's Date: 11/04/2023  PCP: Alan Cirri, NP REFERRING PROVIDER: Celia Coles, MD  END OF SESSION:  End of Session - 11/04/23 1015     Visit Number 4    Number of Visits 17    Date for SLP Re-Evaluation 12/16/23    Authorization Type [employer]    Progress Note Due on Visit 10    SLP Start Time 1016    SLP Stop Time  1100    SLP Time Calculation (min) 44 min    Activity Tolerance Patient tolerated treatment well             Past Medical History:  Diagnosis Date   Allergy    Complication of anesthesia    pt reports he is starting to have more difficulty arousing after surgery   Diabetes mellitus (HCC)    GERD (gastroesophageal reflux disease)    Headache    History of kidney stones    Hyperlipidemia    Renal disorder    kidney stones   Past Surgical History:  Procedure Laterality Date   APPLICATION OF CRANIAL NAVIGATION Right 01/17/2021   Procedure: APPLICATION OF CRANIAL NAVIGATION;  Surgeon: Alan Gelinas, MD;  Location: [facility] OR;  Service: Neurosurgery;  Laterality: Right;   COLONOSCOPY  09/03/1994   COLONOSCOPY WITH PROPOFOL  N/A 01/07/2023   Procedure: COLONOSCOPY WITH PROPOFOL ;  Surgeon: Alan Sink, MD;  Location: [facility] ENDOSCOPY;  Service: Endoscopy;  Laterality: N/A;  NOT TOO EARLY   CYSTOSCOPY WITH STENT PLACEMENT Right 01/25/2016   Procedure: CYSTOSCOPY WITH STENT PLACEMENT;  Surgeon: Alan Born, MD;  Location: [facility] ORS;  Service: Urology;  Laterality: Right;   FRAMELESS  BIOPSY WITH BRAINLAB Right 01/17/2021   Procedure: RIGHT STEREOTACTIC BIOPSY OF INSULAR LESION;  Surgeon: Alan Gelinas, MD;  Location: [facility] OR;  Service: Neurosurgery;  Laterality: Right;   KNEE ARTHROSCOPY W/ MENISCAL REPAIR Right 06/23/1988   POLYPECTOMY  01/07/2023   Procedure: POLYPECTOMY;  Surgeon: Alan Sink, MD;   Location: [facility] ENDOSCOPY;  Service: Endoscopy;;   removal of birthmark  06/08/2013   SKIN CANCER EXCISION  06/23/2012   TYMPANOSTOMY TUBE PLACEMENT  08/06/1978   URETEROSCOPY WITH HOLMIUM LASER LITHOTRIPSY Right 01/25/2016   Procedure: URETEROSCOPY WITH HOLMIUM LASER LITHOTRIPSY;  Surgeon: Alan Born, MD;  Location: [facility] ORS;  Service: Urology;  Laterality: Right;   URETHRAL STRICTURE DILATATION     visual inspection of vocal cord     1973   WISDOM TOOTH EXTRACTION     Patient Active Problem List   Diagnosis Date Noted   Post-ictal aphasia 09/22/2023   CVA (cerebral vascular accident) (HCC) 09/20/2023   Abnormal finding on GI tract imaging 09/16/2023   Cerebrovascular accident (CVA) (HCC) 09/15/2023   Dizziness 09/15/2023   Dysphagia 09/14/2023   Gait disorder 09/14/2023   Seizure disorder (HCC) 09/10/2023   Uncontrolled type 2 diabetes mellitus with hypoglycemia, with long-term current use of insulin  (HCC) 09/10/2023   Acute CVA (cerebrovascular accident) (HCC) 09/10/2023   History of CVA (cerebrovascular accident) 09/09/2023   Essential hypertension 09/09/2023   Status epilepticus (HCC) 05/14/2023   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 year old male  who  has a past medical history of Allergy, Complication of anesthesia, Diabetes mellitus (HCC), GERD  (gastroesophageal reflux disease), Headache, History of kidney stones, Hyperlipidemia, and Renal disorder.  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  They presented to the hospital 09/14/23 with worsening gait, left-sided weakness, and new onset dysphagia.  Of note, he was recently hospitalized at home announce from 3-19 to 3-21 with new CVA and breakthrough seizures.  In the ER, he was started on IV fluids and MRI brain showed new punctate focus of restricted diffusion in the anterior insular cortex compatible with a new acute/subacute infarct, stable 8 mm acute/subacute infarct involving the right cerebral peduncle, stable restricted diffusion in the anterior right frontal lobe and along the atrium of the right lateral ventricle consistent with treated tumor. Known to use from Speech Evaluation prior to CVA 09/09/23, however no therapy started due to hospitalization 09/09/23.   PAIN:  Are you having pain? No                                                                                                                            TREATMENT DATE:   11/04/23: They left communication book at home - will bring it next session. Targeted simple sentence generation with fill in the blank verb (s-o-v) in personally relevant sentences. Alan Wise required frequent written choice of 2 to complete 8/10 sentences. Targeted divergent naming in personally relevant categories (baking, items in his garage, his degrees and schools) - He required visual organization (topic, then numbers) and frequent 1st letter cues, choice of 2 to name 5-7 items in each category.   10/28/23: Targeted naming with Star Wars words - he generated 15 with frequent mod semantic and 1st letter cues. Targeted written expression writing the words with frequent mod A for error awareness and self correction. Targeted verbal compensations for aphasia generating 3-4 descriptions of 2 fast food restaurants ([employer] and [employer]) with frequent mod to max verbal, visual cues (looking at the menu) to generate descriptions as well as his orders - consistent extended time;  10/26/23: Alan Wise enters with communication notebook he and his wife have generated. He is requesting he words be moved to the right of the page rather than centered. 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 Targeted verbal compensations for aphasia generated 3-4 descriptions  of family members. With visual organizer of Name with 1-4 numbered below to support attention to task and with usual mod questioning cues and verbal cues, he generated 4 descriptions of his daughter. Targeted divergent naming in personally relevant category (ballroom dancing) with extended time and occasional min 1st letter or semantic cue, Dasheem named 10 ballroom dances.   10/21/23: Evaluation completed - see above. [name] brought communication notebook we started to the hospital however it was lost. Reviewed what to include in communication support book - she agrees to re-print the pages, adding restaurant orders, bullet points about friends and family, places they frequent. Cued them to think about people, places, items the encounter daily, weekly and monthly - generated ideas and words for book - See patient instructions. Generated home practice of naming items around the home, children's activities, school subjects they study, upcoming activities. Instructed them to also practice the words in his communication support book as these are the most personally relevant. In  divergent naming task (school subjects) Bronislaw benefits from Topic and numbered visual cues to remain on task and avoid tangential speech/language. Spouse endorses they have a  white board at home and will use this strategy during naming tasks at home.     PATIENT EDUCATION: Education details: See Treatment; See Patient Instructions, compensations for aphasia, communication supports, language activities to do at home Person educated: Patient and Spouse Education method: Explanation, Demonstration, Verbal cues, and Handouts Education comprehension: verbalized understanding, returned demonstration, verbal cues required, and needs further education   GOALS: Goals reviewed with patient? Yes - 11/18/23 - target date Pt will use multimodal communication to communicate family names and biographical information with rare min A Baseline: Goal status: ONGOING   2.  Pt and spouse will generate 4 personally relevant places and orders to add to communication support book with occasional min A Baseline:  Goal status: ONGOING   3.  Pt will use multimodal communication to name 8 items in a personally relevant category with occasional min A Baseline:  Goal status: ONGOING   4.  Pt will use external aids to attend to the left when reading sentences  Baseline:  Goal status: ONGOING   5.  Pt will use verbal compensations for aphasia in structured tasks with occasional min A Baseline:  Goal status: ONGOING       LONG TERM GOALS: Target date: 12/16/23   Pt will use communication support notebook and scripting to place order at restaurant and to make 2 business calls Baseline:  Goal status: ONGOING   2.  Pt will use multimodal communication to communicate preferences in simple conversation Baseline:  Goal status: ONGOING   3.  Pt and spouse will carryover 3 compensatory strategies to support pt's auditory comprehension Baseline:  Goal status: ONGOING   4.  Pt will generate moderately complex sentences in structured task such as VNeST with usual min A Baseline:  Goal status:ONGOING   5.  Pt and spouse will carryover 1-2 language enhancing activities at home daily  5/7 days Baseline:  Goal status: ONGOING   6.  Pt and spouse will add 6 new topics/words to communication support notebook Baseline:  Goal status: ONGOING   ASSESSMENT:  CLINICAL IMPRESSION: Patient is a 53 y.o. male who was seen today for moderate aphasia c/b empty, vague language, halting for word finding difficulties. Auditory comprehension impairments noted in conversation gathering history. Written expression impaired at phrase level, spouse endorses texts often don't make sense. "He texts like he speaks". Due  to aphasia, Kamarian plans to retire at the end of this semester. He had difficulty stating spouse and children's names and his address. He endorses significant frustration.  They do endorse some attempts at multimodal communication with inconsistent success. After prior evaluation, they generated communication support book, however this was lost.  I recommend skilled ST to maximize communication for safety, to reduce caregiver burden and improve QOL.   OBJECTIVE IMPAIRMENTS: include aphasia. These impairments are limiting patient from return to work, managing medications, managing appointments, managing finances, ADLs/IADLs, and effectively communicating at home and in community. Factors affecting potential to achieve goals and functional outcome are co-morbidities and severity of impairments. Patient will benefit from skilled SLP services to address above impairments and improve overall function.  REHAB POTENTIAL: Good  PLAN:  SLP FREQUENCY: 2x/week  SLP DURATION: 8 weeks  PLANNED INTERVENTIONS: Language facilitation, Environmental controls, Cueing hierachy, Cognitive reorganization, Internal/external aids, Functional tasks, Multimodal communication approach, SLP instruction and feedback, Compensatory strategies, Patient/family education, [phone] Treatment of speech (30 or 45 min) , and 47829- Speech Eval Sound Prod, Artic, Phon, Eval Compre, Express    Elantra Caprara, Dareen Ebbing,  CCC-SLP 11/04/2023, 11:05 AM

## 2023-11-04 NOTE — Therapy (Signed)
 OUTPATIENT OCCUPATIONAL THERAPY NEURO TREATMENT  Patient Name: Alan Wise MRN: [id number] DOB:01/24/1971, 53 y.o., male Today's Date: 11/04/2023  PCP: Carollynn Cirri, NP REFERRING PROVIDER: Celia Coles, MD   END OF SESSION:     Past Medical History:  Diagnosis Date   Allergy    Complication of anesthesia    pt reports he is starting to have more difficulty arousing after surgery   Diabetes mellitus (HCC)    GERD (gastroesophageal reflux disease)    Headache    History of kidney stones    Hyperlipidemia    Renal disorder    kidney stones   Past Surgical History:  Procedure Laterality Date   APPLICATION OF CRANIAL NAVIGATION Right 01/17/2021   Procedure: APPLICATION OF CRANIAL NAVIGATION;  Surgeon: Van Gelinas, MD;  Location: [facility] OR;  Service: Neurosurgery;  Laterality: Right;   COLONOSCOPY  09/03/1994   COLONOSCOPY WITH PROPOFOL  N/A 01/07/2023   Procedure: COLONOSCOPY WITH PROPOFOL ;  Surgeon: Marnee Sink, MD;  Location: [facility] ENDOSCOPY;  Service: Endoscopy;  Laterality: N/A;  NOT TOO EARLY   CYSTOSCOPY WITH STENT PLACEMENT Right 01/25/2016   Procedure: CYSTOSCOPY WITH STENT PLACEMENT;  Surgeon: Bart Born, MD;  Location: [facility] ORS;  Service: Urology;  Laterality: Right;   FRAMELESS  BIOPSY WITH BRAINLAB Right 01/17/2021   Procedure: RIGHT STEREOTACTIC BIOPSY OF INSULAR LESION;  Surgeon: Van Gelinas, MD;  Location: [facility] OR;  Service: Neurosurgery;  Laterality: Right;   KNEE ARTHROSCOPY W/ MENISCAL REPAIR Right 06/23/1988   POLYPECTOMY  01/07/2023   Procedure: POLYPECTOMY;  Surgeon: Marnee Sink, MD;  Location: [facility] ENDOSCOPY;  Service: Endoscopy;;   removal of birthmark  06/08/2013   SKIN CANCER EXCISION  06/23/2012   TYMPANOSTOMY TUBE PLACEMENT  08/06/1978   URETEROSCOPY WITH HOLMIUM LASER LITHOTRIPSY Right 01/25/2016   Procedure: URETEROSCOPY WITH HOLMIUM LASER LITHOTRIPSY;  Surgeon: Bart Born, MD;  Location: [facility] ORS;  Service: Urology;   Laterality: Right;   URETHRAL STRICTURE DILATATION     visual inspection of vocal cord     1973   WISDOM TOOTH EXTRACTION     Patient Active Problem List   Diagnosis Date Noted   Post-ictal aphasia 09/22/2023   CVA (cerebral vascular accident) (HCC) 09/20/2023   Abnormal finding on GI tract imaging 09/16/2023   Cerebrovascular accident (CVA) (HCC) 09/15/2023   Dizziness 09/15/2023   Dysphagia 09/14/2023   Gait disorder 09/14/2023   Seizure disorder (HCC) 09/10/2023   Uncontrolled type 2 diabetes mellitus with hypoglycemia, with long-term current use of insulin  (HCC) 09/10/2023   Acute CVA (cerebrovascular accident) (HCC) 09/10/2023   History of CVA (cerebrovascular accident) 09/09/2023   Essential hypertension 09/09/2023   Status epilepticus (HCC) 05/14/2023   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  Rationale for Evaluation and Treatment: Rehabilitation  SUBJECTIVE:   SUBJECTIVE STATEMENT: Pt reports he has no concerns about being able to drive. Pt's wife reports the pt feels that she is the one preventing him from driving.   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  Presented to the hospital 09/14/2023 with worsening gait left-sided weakness/aphasia and new onset dysphagia.  Of note, he was recently hospitalized from 3-19 to 3-21 with new  CVA and breakthrough seizures.  In the ED he was started on IV fluids and MRI of the brain showed new punctate focus of restricted diffusion in the anterior insular cortex compatible with new acute/subacute infarct, stable 8 mm acute/subacute infarct involving the right cerebral peduncle, stable restricted diffusion in the anterior frontal lobe and along the atrium of the right lateral ventricle consistent with treated tumor, improved diffusion changes within the genu of the corpus callosum suggesting these were ischemic, stable chronic encephalomalacia of the right frontal operculum, and stable periventricular T2 hyperintensities in the left hemisphere likely reflects the sequela of chronic microvascular ischemia. He was started on Eliquis , AEDs were adjusted and admitted with evaluation by neurology and Dr. Mark Sil with neuro-oncology, because of recent decline determined to be multifocal secondary to radiation necrosis, breakthrough seizures and multiple recent strokes.  Hospitalization complicated by dysphagia and GI determined to be neurologic.  Last known seizure 09/16/23, with subsequent increase nightly Lamictal .  Therapy evaluations completed due to patient decreased functional mobility was admitted for a comprehensive rehab program.  PRECAUTIONS: Fall  WEIGHT BEARING RESTRICTIONS: No  PAIN:  Are you having pain? No  FALLS: Has patient fallen in last 6 months? Yes. Number of falls 3 - trying to put pants on, ladder before stroke, carrying cat litter and lost balance on steps  LIVING ENVIRONMENT:  Home Living/Prior Functioning Home Living: Private residence Living Arrangements: Spouse - [name], Children - daughter [name], son 5yo Available Help at Discharge: Family, Available 24 hours/day Type of Home: House Home Access: Stairs to enter (stone steps) Entrance Stairs-Number of Steps: 4 Entrance Stairs-Rails: Right, Left Home Layout: Two level, Able to live on main level with bedroom/bathroom  (can live on main level) Alternate Level Stairs-Number of Steps: flight of steps to 2nd floor bedroom Alternate Level Stairs-Rails:  (L for first 5 and R on second 5) Bathroom Shower/Tub: Psychologist, counselling, Sport and exercise psychologist: Standard Bathroom Accessibility: Yes IADL History Homemaking Responsibilities: No Prior Function/Level of Independence: Independent with basic ADLs, Independent with transfers, Independent with gait, Independent with homemaking with ambulation Able to Take Stairs?: Yes Driving: No (not driving for several years due to seizures)  PLOF: Independent with basic ADLs and Independent with household mobility without device Prior interests: Wrestled, ball room dance, soccer, professor of mathematics at [employer]  PATIENT GOALS: Pt reported he needs to work on L side awareness, vision on left side, strength and putting jacket on.  OBJECTIVE:  Note: Objective measures were completed at Evaluation unless otherwise noted.  HAND DOMINANCE: Left  ADLs: Overall ADLs: Min assist to Mod Ind Transfers/ambulation related to ADLs: Mod Ind Eating: Pt switched to R hand for eating, wife helps cut food Grooming: Wife used clippers recently UB Dressing: Reports he struggles with button up shirt/jacket,  LB Dressing: Pt spouse report he fell with LB dressing in standing, wife confirms he can tie shoelaces Toileting: Ind Bathing: Mod I Tub Shower transfers: walk in shower with built in bench with B showerheads Equipment: none  IADLs: Shopping: wife Light housekeeping: wife Meal Prep: wife Community mobility: Supervision Medication management: Wife assists Financial management: Wife Handwriting: Increased time - due to aphasia (June 2024 aphasia, speech, reading)  MOBILITY STATUS: Hx of falls  POSTURE COMMENTS:  rounded shoulders and forward head Sitting balance: WFL  ACTIVITY TOLERANCE: Activity tolerance: Fair  FUNCTIONAL OUTCOME MEASURES: TBA  UPPER EXTREMITY ROM:     Active ROM Right eval Left eval  Shoulder flexion 120 90  Shoulder abduction    Shoulder adduction    Shoulder extension    Shoulder internal rotation    Shoulder external rotation    Elbow flexion WNL WFL  Elbow extension WNL WFL  Wrist flexion  Sight limits  Wrist extension    Wrist ulnar deviation    Wrist radial deviation    Wrist pronation    Wrist supination    (Blank rows = not tested)  UPPER EXTREMITY MMT:     MMT Right eval Left eval  Shoulder flexion 4 3-  Shoulder abduction    Shoulder adduction    Shoulder extension    Shoulder internal rotation    Shoulder external rotation    Middle trapezius    Lower trapezius    Elbow flexion 5 4+  Elbow extension 4+ 4  Wrist flexion    Wrist extension    Wrist ulnar deviation    Wrist radial deviation    Wrist pronation    Wrist supination    (Blank rows = not tested)  HAND FUNCTION: Grip strength: Right: 53.5, 57.5, 53.3  lbs; Left: 42.9, 51.8, 41.8  lbs Right: 54.8 lbs Left: 45.5 lbs  COORDINATION: 9 Hole Peg test: Right: 41.24  sec; Left: 1:04.00 sec  SENSATION: Patient initially reported normal but then noted he may have some limtations ie) does not know   EDEMA: NA  MUSCLE TONE: Some changes ie) not as smooth with movements and coordination  COGNITION: Overall cognitive status: Impaired/Aphasia impacts cognition Hospital: [facility] Summary Score: 12   VISION: Subjective report: January - vision was decreasing Baseline vision: Wears glasses for reading only about 1 year Visual history: changes were happening perhaps a precursor to CVA  MR review: Vision Baseline Vision/History: 1 Wears glasses Ability to See in Adequate Light: 1 Impaired Patient Visual Report: Peripheral vision impairment Vision Assessment?: Wears glasses for reading Eye Alignment: Within Functional Limits Alignment/Gaze Preference: Within Defined Limits Visual Fields: Left homonymous hemianopsia Additional Comments: L  peripheral vision decr since January  VISION ASSESSMENT: Tracking/Visual pursuits: Impaired - pt has trouble with vision on L side  Patient has difficulty with following activities due to following visual impairments: walks into walls, chairs etc, ie) doesn't always scan especially if he is tired, agitated, etc  10/22/23 -  R side peripheral vision: approx. 70* L side peripheral vision: visual field cut, unable to see marker until midline.  Pt reported difficulty seeing objects on L side.  PERCEPTION: Not tested  PRAXIS: Not tested                                                                                                                           TREATMENT :    OT reviewed RUE and LUE red  theraputty exercises (roll, search, grip, and pinch) as noted in patient instructions for coordination and strength. Pt required max cueing initially then improved to min cueing with repetition.  Wrist flex and ext with tan (12.5 lbs) flex bar x 2 min for strength and endurance of affected extremity  Supination with tan (12.5 lbs) flex bar x 2 min for strength and endurance of affected extremity  Pronation with tan (12.5 lbs) flex bar x 2 min for strength and endurance of affected extremity  OT educated pt on safety concerns with driving given seizure history, cognition, and L field cut.   PATIENT EDUCATION: Education details: strengthening; driving Person educated: Patient and Spouse Education method: Explanation, Demonstration, Tactile cues, and Verbal cues Education comprehension: verbalized understanding, returned demonstration, verbal cues required, tactile cues required, and needs further education  HOME EXERCISE PROGRAM: 10/20/2023: scanning activities; vision strategies 10/22/2023: PUTTY Access Code: [email or web address]   GOALS: Goals reviewed with patient? Yes  SHORT TERM GOALS: Target date: 11/20/23  Patient will demonstrate initial UE HEP with 25% verbal cues or less for proper execution  (d/t B shoulder limitations and L sided weakness s/p CVA). Baseline: New to outpt OT Goal status: INITIAL   2.  Patient will demonstrate at least 5+ lbs improvement for BUE grip strength as needed to open jars and other containers. Baseline: Right: 54.8 lbs Left: 45.5 lbs Goal status: INITIAL   3.  Patient will demo improved FM coordination as evidenced by completing nine-hole peg 5+ seconds faster with use of BUE.  Baseline: 9 Hole Peg test: Right: 41.24  sec; Left: 1:04.00 sec Goal status: INITIAL   4.  Patient will independently recall at least 2 compensatory strategies for visual impairment without cueing. Baseline: New to outpt OT - bumps into furniture Goal status: INITIAL   5. Pt will verbalize understanding of modifications, strategies and/or equipment PRN with visual cues as needed to increase safety and independence with ADLs and IADLs (I.e. with decreased fall risk). Baseline: New to outpt OT - fell donning pants Goal Status: INITIAL   LONG TERM GOALS: Target date: 12/18/23   Patient will demonstrate updated UE HEP with visual handouts only for proper execution.  Baseline: New to outpt OT  Goal status: INITIAL   2.  Patient will return demonstration of visual adaptation use and verbalize understanding of how to obtain item(s) if desired.  Baseline: New to outpt OT Goal status: INITIAL   3.  Patient will demo improved FM coordination as evidenced by completing nine-hole peg with use of BUE as close to 30 seconds as possible.  Baseline: 9 Hole Peg test: Right: 41.24  sec; Left: 1:04.00 sec Goal status: INITIAL   4.  Pt will write/print his name/initials. Baseline: difficult due to aphasia by report Goal status: INITIAL   5.  Pt will report increased ease with donning all clothing items - shirt, socks/shoes without assistance of spouse Baseline: occasional assistance of spouse Goal status: INITIAL  ASSESSMENT:  CLINICAL IMPRESSION: Pt agitated at times especially  when discussing driving limitations, this was mostly directed to his wife. Poor insight to impairments.  PERFORMANCE DEFICITS: in functional skills including ADLs, IADLs, coordination, dexterity, proprioception, sensation, tone, ROM, strength, pain, fascial restrictions, muscle spasms, flexibility, Fine motor control, Gross motor control, mobility, balance, body mechanics, endurance, decreased knowledge of precautions, decreased knowledge of use of DME, vision, and UE functional use, cognitive skills including attention, emotional, energy/drive, learn, memory, perception, problem solving, safety awareness, sequencing, temperament/personality, thought, and understand, and psychosocial  skills including coping strategies, environmental adaptation, habits, interpersonal interactions, and routines and behaviors.   IMPAIRMENTS: are limiting patient from ADLs, IADLs, rest and sleep, education, work, play, leisure, and social participation.   CO-MORBIDITIES: has co-morbidities such as h/o frontal oligodendroglioma  that affects occupational performance. Patient will benefit from skilled OT to address above impairments and improve overall function.  REHAB POTENTIAL: Good  PLAN:  OT FREQUENCY: 1-2x/week  OT DURATION: 8 weeks  PLANNED INTERVENTIONS: 97535 self care/ADL training, 16109 therapeutic exercise, 97530 therapeutic activity, 97112 neuromuscular re-education, 97140 manual therapy, balance training, functional mobility training, visual/perceptual remediation/compensation, psychosocial skills training, energy conservation, coping strategies training, patient/family education, and DME and/or AE instructions  RECOMMENDED OTHER SERVICES: NA - other therapies already scheduled at this time  CONSULTED AND AGREED WITH PLAN OF CARE: Patient and family member/caregiver  PLAN FOR NEXT SESSION:  Shoulder dowel HEP  More thorough evaluation of vision  Visual scanning activities - encourage L side awareness  (balloon + scanning) Review theraputty HEP HEP update: UE ROM (sh) and lower-level coordination (2-4 exercises) ADL comp strategies   Altamease Asters, OT 11/04/2023, 11:53 AM

## 2023-11-05 ENCOUNTER — Encounter: Payer: Self-pay | Admitting: Internal Medicine

## 2023-11-05 ENCOUNTER — Inpatient Hospital Stay: Attending: Internal Medicine | Admitting: Licensed Clinical Social Worker

## 2023-11-05 DIAGNOSIS — C710 Malignant neoplasm of cerebrum, except lobes and ventricles: Secondary | ICD-10-CM

## 2023-11-05 NOTE — Progress Notes (Signed)
 [facility] CSW Progress Note  Visual merchandiser received a request from pt's wife to reach out to the [employer] on pt's behalf as she has been trying to reach them for two weeks w/ no response.   CSW sent an email to the [facility] receiving a response stating they will call pt's wife this afternoon.  The above information relayed to pt's wife.  CSW to remain available as appropriate to provide support.      Quenton Bruns, LCSW Clinical Social Worker [facility]

## 2023-11-06 ENCOUNTER — Ambulatory Visit: Admitting: Speech Pathology

## 2023-11-06 ENCOUNTER — Encounter: Payer: Self-pay | Admitting: Physical Therapy

## 2023-11-06 ENCOUNTER — Ambulatory Visit: Admitting: Occupational Therapy

## 2023-11-06 ENCOUNTER — Ambulatory Visit: Admitting: Physical Therapy

## 2023-11-06 VITALS — BP 142/91 | HR 60

## 2023-11-06 DIAGNOSIS — R2689 Other abnormalities of gait and mobility: Secondary | ICD-10-CM

## 2023-11-06 DIAGNOSIS — R4701 Aphasia: Secondary | ICD-10-CM

## 2023-11-06 DIAGNOSIS — R41841 Cognitive communication deficit: Secondary | ICD-10-CM

## 2023-11-06 DIAGNOSIS — M6281 Muscle weakness (generalized): Secondary | ICD-10-CM | POA: Diagnosis not present

## 2023-11-06 NOTE — Therapy (Signed)
 OUTPATIENT SPEECH LANGUAGE PATHOLOGY APHASIA TREATMENT   Patient Name: Alan Wise MRN: [id number] DOB:1971/05/05, 53 y.o., male Today's Date: 11/06/2023  PCP: Alan Cirri, NP REFERRING PROVIDER: Celia Coles, MD  END OF SESSION:  End of Session - 11/06/23 1022     Visit Number 5    Number of Visits 17    Date for SLP Re-Evaluation 12/16/23    Authorization Type [employer]    Progress Note Due on Visit 10    SLP Start Time 1017    SLP Stop Time  1100    SLP Time Calculation (min) 43 min    Activity Tolerance Patient tolerated treatment well             Past Medical History:  Diagnosis Date   Allergy    Complication of anesthesia    pt reports he is starting to have more difficulty arousing after surgery   Diabetes mellitus (HCC)    GERD (gastroesophageal reflux disease)    Headache    History of kidney stones    Hyperlipidemia    Renal disorder    kidney stones   Past Surgical History:  Procedure Laterality Date   APPLICATION OF CRANIAL NAVIGATION Right 01/17/2021   Procedure: APPLICATION OF CRANIAL NAVIGATION;  Surgeon: Alan Gelinas, MD;  Location: [facility] OR;  Service: Neurosurgery;  Laterality: Right;   COLONOSCOPY  09/03/1994   COLONOSCOPY WITH PROPOFOL  N/A 01/07/2023   Procedure: COLONOSCOPY WITH PROPOFOL ;  Surgeon: Alan Sink, MD;  Location: [facility] ENDOSCOPY;  Service: Endoscopy;  Laterality: N/A;  NOT TOO EARLY   CYSTOSCOPY WITH STENT PLACEMENT Right 01/25/2016   Procedure: CYSTOSCOPY WITH STENT PLACEMENT;  Surgeon: Alan Born, MD;  Location: [facility] ORS;  Service: Urology;  Laterality: Right;   FRAMELESS  BIOPSY WITH BRAINLAB Right 01/17/2021   Procedure: RIGHT STEREOTACTIC BIOPSY OF INSULAR LESION;  Surgeon: Alan Gelinas, MD;  Location: [facility] OR;  Service: Neurosurgery;  Laterality: Right;   KNEE ARTHROSCOPY W/ MENISCAL REPAIR Right 06/23/1988   POLYPECTOMY  01/07/2023   Procedure: POLYPECTOMY;  Surgeon: Alan Sink, MD;   Location: [facility] ENDOSCOPY;  Service: Endoscopy;;   removal of birthmark  06/08/2013   SKIN CANCER EXCISION  06/23/2012   TYMPANOSTOMY TUBE PLACEMENT  08/06/1978   URETEROSCOPY WITH HOLMIUM LASER LITHOTRIPSY Right 01/25/2016   Procedure: URETEROSCOPY WITH HOLMIUM LASER LITHOTRIPSY;  Surgeon: Alan Born, MD;  Location: [facility] ORS;  Service: Urology;  Laterality: Right;   URETHRAL STRICTURE DILATATION     visual inspection of vocal cord     1973   WISDOM TOOTH EXTRACTION     Patient Active Problem List   Diagnosis Date Noted   Post-ictal aphasia 09/22/2023   CVA (cerebral vascular accident) (HCC) 09/20/2023   Abnormal finding on GI tract imaging 09/16/2023   Cerebrovascular accident (CVA) (HCC) 09/15/2023   Dizziness 09/15/2023   Dysphagia 09/14/2023   Gait disorder 09/14/2023   Seizure disorder (HCC) 09/10/2023   Uncontrolled type 2 diabetes mellitus with hypoglycemia, with long-term current use of insulin  (HCC) 09/10/2023   Acute CVA (cerebrovascular accident) (HCC) 09/10/2023   History of CVA (cerebrovascular accident) 09/09/2023   Essential hypertension 09/09/2023   Status epilepticus (HCC) 05/14/2023   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  Pt accompanied by: family member - spouse, Alan Wise   PERTINENT HISTORY: Pt is a  53 y.o. year old male  who  has a past medical history of  Allergy, Complication of anesthesia, Diabetes mellitus (HCC), GERD (gastroesophageal reflux disease), Headache, History of kidney stones, Hyperlipidemia, and Renal disorder.  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  They presented to the hospital 09/14/23 with worsening gait, left-sided weakness, and new onset dysphagia.  Of note, he was recently hospitalized at home announce from 3-19 to 3-21 with new CVA and breakthrough seizures.  In the ER, he was started on IV fluids and MRI brain showed new punctate focus of restricted diffusion in the anterior insular cortex compatible with a new acute/subacute infarct, stable 8 mm acute/subacute infarct involving the right cerebral peduncle, stable restricted diffusion in the anterior right frontal lobe and along the atrium of the right lateral ventricle consistent with treated tumor. Known to use from Speech Evaluation prior to CVA 09/09/23, however no therapy started due to hospitalization 09/09/23.   PAIN:  Are you having pain? No                                                                                                                            TREATMENT DATE:   11/06/23: Reviewed purpose of communication book, target employment for discussion of every day subjects. Using written aid, pt does successfully communication response to SLP questions in 4/5 trials though evidencing vague language, empty speech. Target generative naming with personally relevant topics. 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 X1 attempts of pt providing description of anomic target. Demonstration of phonemic cueing provided to aid in anomia recovery. Education provided to Alan Wise regarding benefit of receiving cues from communication partners to aid in recovery.   11/04/23: They left communication book at home - will bring it next session.  Targeted simple sentence generation with fill in the blank verb (s-o-v) in personally relevant sentences. Alan Wise required frequent written choice of 2 to complete 8/10 sentences. Targeted divergent naming in personally relevant categories (baking, items in his garage, his degrees and schools) - He required visual organization (topic, then numbers) and frequent 1st letter cues, choice of 2 to name 5-7 items in each category.   10/28/23: Targeted naming with Star Wars words - he generated 15 with frequent mod semantic and 1st letter cues. Targeted written expression writing the words with frequent mod A for error awareness and self correction. Targeted verbal compensations for aphasia generating 3-4 descriptions of 2 fast food restaurants ([employer] and [employer]) with frequent mod to max verbal, visual cues (looking at the menu) to generate descriptions as well as his orders - consistent extended time;  10/26/23: Jayion enters with communication notebook he and his wife have generated. He is requesting he words be moved to the right of the page rather than centered. 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 Targeted verbal compensations for aphasia generated 3-4 descriptions of family members. With visual organizer of Name with 1-4 numbered below to support attention to task and with usual mod questioning cues and verbal cues, he generated 4 descriptions of his daughter. Targeted divergent naming in personally relevant category (ballroom dancing) with extended time and occasional min 1st letter or semantic cue, Derrell named 10 ballroom dances.   10/21/23: Evaluation completed - see above. Alan Wise brought communication notebook we started to the hospital however it  was lost. Reviewed what to include in communication support book - she agrees to re-print the pages, adding restaurant orders, bullet points about friends and family, places they frequent. Cued them to think about people, places, items the encounter daily, weekly and monthly - generated ideas and words for book - See patient instructions. Generated home practice of naming items around the home, children's activities, school subjects they study, upcoming activities. Instructed them to also practice the words in his communication support book as these are the most personally relevant. In  divergent naming task (school subjects) Jaxston benefits from Topic and numbered visual cues to remain on task and avoid tangential speech/language. Spouse endorses they have a white board at home and will use this strategy during naming tasks at home.     PATIENT EDUCATION: Education details: See Treatment; See Patient Instructions, compensations for aphasia, communication supports, language activities to do at home Person educated: Patient and Spouse Education method: Explanation, Demonstration, Verbal cues, and Handouts Education comprehension: verbalized understanding, returned demonstration, verbal cues required, and needs further education   GOALS: Goals reviewed with patient? Yes - 11/18/23 - target date Pt will use multimodal communication to communicate family names and biographical information with rare min A Baseline: Goal status: ONGOING   2.  Pt and spouse will generate 4 personally relevant places and orders to add to communication support book with occasional min A Baseline:  Goal status: ONGOING   3.  Pt will use multimodal communication to name 8 items in a personally relevant category with occasional min A Baseline:  Goal status: ONGOING   4.  Pt will use external aids to attend to the left when reading sentences  Baseline:  Goal status: ONGOING   5.  Pt will use verbal compensations for  aphasia in structured tasks with occasional min A Baseline:  Goal status: ONGOING       LONG TERM GOALS: Target date: 12/16/23   Pt will use communication support notebook and scripting to place order at restaurant and to make 2 business calls Baseline:  Goal status: ONGOING   2.  Pt will use multimodal communication to communicate preferences in simple conversation Baseline:  Goal status: ONGOING   3.  Pt and spouse will carryover 3 compensatory strategies to support pt's auditory comprehension Baseline:  Goal status: ONGOING   4.  Pt will generate moderately complex sentences in  structured task such as VNeST with usual min A Baseline:  Goal status:ONGOING   5.  Pt and spouse will carryover 1-2 language enhancing activities at home daily 5/7 days Baseline:  Goal status: ONGOING   6.  Pt and spouse will add 6 new topics/words to communication support notebook Baseline:  Goal status: ONGOING   ASSESSMENT:  CLINICAL IMPRESSION: Patient is a 53 y.o. male who was seen today for moderate aphasia c/b empty, vague language, halting for word finding difficulties. Auditory comprehension impairments noted in conversation gathering history. Written expression impaired at phrase level, spouse endorses texts often don't make sense. "He texts like he speaks". Due to aphasia, Laithan plans to retire at the end of this semester. He had difficulty stating spouse and children's names and his address. He endorses significant frustration.  They do endorse some attempts at multimodal communication with inconsistent success. After prior evaluation, they generated communication support book, however this was lost.  I recommend skilled ST to maximize communication for safety, to reduce caregiver burden and improve QOL.   OBJECTIVE IMPAIRMENTS: include aphasia. These impairments are limiting patient from return to work, managing medications, managing appointments, managing finances, ADLs/IADLs, and  effectively communicating at home and in community. Factors affecting potential to achieve goals and functional outcome are co-morbidities and severity of impairments. Patient will benefit from skilled SLP services to address above impairments and improve overall function.  REHAB POTENTIAL: Good  PLAN:  SLP FREQUENCY: 2x/week  SLP DURATION: 8 weeks  PLANNED INTERVENTIONS: Language facilitation, Environmental controls, Cueing hierachy, Cognitive reorganization, Internal/external aids, Functional tasks, Multimodal communication approach, SLP instruction and feedback, Compensatory strategies, Patient/family education, [phone] Treatment of speech (30 or 45 min) , and 14782- Speech Eval Sound Prod, Artic, Phon, Eval Compre, Express    Alston Jerry, CCC-SLP 11/06/2023, 10:23 AM

## 2023-11-06 NOTE — Therapy (Signed)
 OUTPATIENT PHYSICAL THERAPY NEURO TREATMENT    Patient Name: Alan Wise MRN: [id number] DOB:09/20/1970, 53 y.o., male Today's Date: 11/06/2023   PCP: Helayne Lo, NP REFERRING PROVIDER: Celia Coles, MD  END OF SESSION:  PT End of Session - 11/06/23 1104     Visit Number 5    Number of Visits 13    Date for PT Re-Evaluation 12/08/23    Authorization Type [employer]    PT Start Time 1102    PT Stop Time 1145    PT Time Calculation (min) 43 min    Equipment Utilized During Treatment Gait belt    Activity Tolerance Patient tolerated treatment well    Behavior During Therapy WFL for tasks assessed/performed;Agitated             Past Medical History:  Diagnosis Date   Allergy    Complication of anesthesia    pt reports he is starting to have more difficulty arousing after surgery   Diabetes mellitus (HCC)    GERD (gastroesophageal reflux disease)    Headache    History of kidney stones    Hyperlipidemia    Renal disorder    kidney stones   Past Surgical History:  Procedure Laterality Date   APPLICATION OF CRANIAL NAVIGATION Right 01/17/2021   Procedure: APPLICATION OF CRANIAL NAVIGATION;  Surgeon: Van Gelinas, MD;  Location: [facility] OR;  Service: Neurosurgery;  Laterality: Right;   COLONOSCOPY  09/03/1994   COLONOSCOPY WITH PROPOFOL  N/A 01/07/2023   Procedure: COLONOSCOPY WITH PROPOFOL ;  Surgeon: Marnee Sink, MD;  Location: [facility] ENDOSCOPY;  Service: Endoscopy;  Laterality: N/A;  NOT TOO EARLY   CYSTOSCOPY WITH STENT PLACEMENT Right 01/25/2016   Procedure: CYSTOSCOPY WITH STENT PLACEMENT;  Surgeon: Bart Born, MD;  Location: [facility] ORS;  Service: Urology;  Laterality: Right;   FRAMELESS  BIOPSY WITH BRAINLAB Right 01/17/2021   Procedure: RIGHT STEREOTACTIC BIOPSY OF INSULAR LESION;  Surgeon: Van Gelinas, MD;  Location: [facility] OR;  Service: Neurosurgery;  Laterality: Right;   KNEE ARTHROSCOPY W/ MENISCAL REPAIR Right 06/23/1988   POLYPECTOMY   01/07/2023   Procedure: POLYPECTOMY;  Surgeon: Marnee Sink, MD;  Location: [facility] ENDOSCOPY;  Service: Endoscopy;;   removal of birthmark  06/08/2013   SKIN CANCER EXCISION  06/23/2012   TYMPANOSTOMY TUBE PLACEMENT  08/06/1978   URETEROSCOPY WITH HOLMIUM LASER LITHOTRIPSY Right 01/25/2016   Procedure: URETEROSCOPY WITH HOLMIUM LASER LITHOTRIPSY;  Surgeon: Bart Born, MD;  Location: [facility] ORS;  Service: Urology;  Laterality: Right;   URETHRAL STRICTURE DILATATION     visual inspection of vocal cord     1973   WISDOM TOOTH EXTRACTION     Patient Active Problem List   Diagnosis Date Noted   Post-ictal aphasia 09/22/2023   CVA (cerebral vascular accident) (HCC) 09/20/2023   Abnormal finding on GI tract imaging 09/16/2023   Cerebrovascular accident (CVA) (HCC) 09/15/2023   Dizziness 09/15/2023   Dysphagia 09/14/2023   Gait disorder 09/14/2023   Seizure disorder (HCC) 09/10/2023   Uncontrolled type 2 diabetes mellitus with hypoglycemia, with long-term current use of insulin  (HCC) 09/10/2023   Acute CVA (cerebrovascular accident) (HCC) 09/10/2023   History of CVA (cerebrovascular accident) 09/09/2023   Essential hypertension 09/09/2023   Status epilepticus (HCC) 05/14/2023   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/2021   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   Focal seizures (HCC) 12/31/2020   Type 2 diabetes mellitus with hyperglycemia (HCC) 06/12/2014  Hyperlipidemia associated with type 2 diabetes mellitus (HCC) 06/12/2014    ONSET DATE: 08/23/23  REFERRING DIAG: I63.9 (ICD-10-CM) - Cerebral infarction, unspecified  THERAPY DIAG:  Muscle weakness (generalized)  Other abnormalities of gait and mobility  Rationale for Evaluation and Treatment: Rehabilitation  SUBJECTIVE:                                                                                                                                                                                              SUBJECTIVE STATEMENT: Patient reports doing well. Did have a few "wobbles" at home per wife. Denies falls.   Pt accompanied by: self and family member - mother [name]  PERTINENT HISTORY: past medical history of Allergy, Complication of anesthesia, Diabetes mellitus (HCC), GERD (gastroesophageal reflux disease), Headache, History of kidney stones, Hyperlipidemia, and Renal disorder. 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  PAIN:  Are you having pain? No  PRECAUTIONS: Fall and Other: Seizures and L visual cut    PATIENT GOALS: Improve walking, balance                                                                                            TREATMENT DATE:   Self Care: Vitals:   11/06/23 1123  BP: (!) 142/91  Pulse: 60   Vitals assessed as noted above Discussed POC as last scheduled PT visit - patient wanting to add a few more in to work on functional lifting and balance Discuss safety as patient trying to carry very heavy laundry baskets upstairs with near falls; PT emphasizes safety with task and stated this task not appropriate for safety at this time  TherAct:  Functional lifting work -1 x 230' carrying Surge  -3x10 full ROM deadlift with Surge -Holding surge with trial of taps to 8" box - unable to complete without modA so discontinued   NMR: Between // bars to work towards stability for stairs (CGA) Standing on airex and tapping without UE support to 8 box x 10 reps Standing on airex and tapping without UE support to 8 box with 5lb kettle bell hold x 10 reps Standing on airex and tapping without UE support to 8 box with 10lb kettle bell hold x 10 reps  PATIENT EDUCATION:  Education details: continue HEP, see above, PT recommending 24/7 supervision Person educated: Patient and Parent Education method: Explanation, Demonstration, and Handouts Education comprehension: verbalized understanding and needs  further education  HOME EXERCISE PROGRAM: Access Code: [email or web address] URL: [email or web address] Date: 10/28/2023 Prepared by: Camella Cave  Exercises - Half Deadlift with Kettlebell  - 1 x daily - 4 x weekly - 3 sets - 10 reps - Sit to Stand Without Arm Support  - 1 x daily - 4 x weekly - 3 sets - 10 reps  GOALS: Goals reviewed with patient? Yes  SHORT TERM GOALS: Target date: 11/10/2023  Pt will demo >50% compliance with HEP to self manage symptoms. Baseline: TBD, reports doing HEP about 30% of the time Goal status: NOT MET   LONG TERM GOALS: Target date: 12/08/2023   Patient will demo 5x sit to stand score of <16 sec to improve overall functional strength. Baseline: 34 s without UE support Goal status: INITIAL  2.  Pt will demo functional gait assessment score of >24/30 to improve overall balance with functional gait. Baseline: 20/30 Goal status: INITIAL  3.  Pt will be able to perform reciprocal gait pattern when descending stairs with use of one rail Baseline: step to pattern when descending (leading with L LE) with one rail (10/13/23) Goal status: INITIAL  4.  Patient will demo gait speed of >0.90 m/s to improve functional mobility in community. Baseline: TBD, 1.3 m/s  Goal status: MET  5.  Pt will demo improved overall safety awareness and decision making to avoid risk for fall and report no falls during course of 4 weeks consecutively. Baseline:  Goal status: INITIAL   ASSESSMENT:  CLINICAL IMPRESSION: Patient seen for skilled PT session with emphasis on functional lifting and stability. Patient needs cues due to L inattention for awareness of wall when carrying surge and side of airex cushion with balance work. Adding a few visits to work on single leg stability work and functional lifting. Continue POC.   OBJECTIVE IMPAIRMENTS: Abnormal gait, decreased activity tolerance, decreased balance, decreased endurance, decreased knowledge of condition,  decreased mobility, difficulty walking, decreased strength, decreased safety awareness, impaired perceived functional ability, impaired tone, impaired UE functional use, and impaired vision/preception.   ACTIVITY LIMITATIONS: carrying, lifting, bending, standing, squatting, stairs, transfers, and locomotion level  PARTICIPATION LIMITATIONS: meal prep, cleaning, laundry, driving, shopping, community activity, occupation, and yard work  PERSONAL FACTORS: Time since onset of injury/illness/exacerbation and 3+ comorbidities: brain tumor, CVA, seizures are also affecting patient's functional outcome.   REHAB POTENTIAL: Good  CLINICAL DECISION MAKING: Stable/uncomplicated  EVALUATION COMPLEXITY: Low  PLAN:  PT FREQUENCY: 1-2x/week  PT DURATION: 8 weeks  PLANNED INTERVENTIONS: 97164- PT Re-evaluation, 97750- Physical Performance Testing, 97110-Therapeutic exercises, 97530- Therapeutic activity, 97112- Neuromuscular re-education, 97535- Self Care, 54098- Manual therapy, [phone]- Gait training, Patient/Family education, Balance training, Stair training, Joint mobilization, Visual/preceptual remediation/compensation, DME instructions, Cryotherapy, and Moist heat  PLAN FOR NEXT SESSION:  Patient had 3 more PT visits added on 5/16 - will D/C following final visit per last plan with patient and spouse Work on functional lifting, stairs, single leg stability with lifting tasks, balance on compliant surfaces, backwards stepping    Coreen Devoid, PT, DPT 11/06/2023, 12:26 PM

## 2023-11-11 ENCOUNTER — Ambulatory Visit

## 2023-11-11 ENCOUNTER — Ambulatory Visit: Admitting: Occupational Therapy

## 2023-11-11 DIAGNOSIS — R208 Other disturbances of skin sensation: Secondary | ICD-10-CM

## 2023-11-11 DIAGNOSIS — M6281 Muscle weakness (generalized): Secondary | ICD-10-CM | POA: Diagnosis not present

## 2023-11-11 DIAGNOSIS — R41842 Visuospatial deficit: Secondary | ICD-10-CM

## 2023-11-11 DIAGNOSIS — C719 Malignant neoplasm of brain, unspecified: Secondary | ICD-10-CM

## 2023-11-11 DIAGNOSIS — R278 Other lack of coordination: Secondary | ICD-10-CM

## 2023-11-11 DIAGNOSIS — R29818 Other symptoms and signs involving the nervous system: Secondary | ICD-10-CM

## 2023-11-11 DIAGNOSIS — I6300 Cerebral infarction due to thrombosis of unspecified precerebral artery: Secondary | ICD-10-CM

## 2023-11-11 NOTE — Therapy (Addendum)
 OUTPATIENT OCCUPATIONAL THERAPY NEURO TREATMENT  Patient Name: Alan Wise MRN: [id number] DOB:Jul 27, 1970, 53 y.o., male Today's Date: 11/11/2023  PCP: Carollynn Cirri, NP REFERRING PROVIDER: Celia Coles, MD   END OF SESSION:  OT End of Session - 11/11/23 1023     Visit Number 6    Number of Visits 16    Date for OT Re-Evaluation 12/18/23    Authorization Type [employer] 2025 OOPM MET    Authorization Time Period Covered 100% VL:? Auth:?    OT Start Time 1022    OT Stop Time 1102    OT Time Calculation (min) 40 min    Activity Tolerance Patient tolerated treatment well    Behavior During Therapy WFL for tasks assessed/performed               Past Medical History:  Diagnosis Date   Allergy    Complication of anesthesia    pt reports he is starting to have more difficulty arousing after surgery   Diabetes mellitus (HCC)    GERD (gastroesophageal reflux disease)    Headache    History of kidney stones    Hyperlipidemia    Renal disorder    kidney stones   Past Surgical History:  Procedure Laterality Date   APPLICATION OF CRANIAL NAVIGATION Right 01/17/2021   Procedure: APPLICATION OF CRANIAL NAVIGATION;  Surgeon: Van Gelinas, MD;  Location: [facility] OR;  Service: Neurosurgery;  Laterality: Right;   COLONOSCOPY  09/03/1994   COLONOSCOPY WITH PROPOFOL  N/A 01/07/2023   Procedure: COLONOSCOPY WITH PROPOFOL ;  Surgeon: Marnee Sink, MD;  Location: [facility] ENDOSCOPY;  Service: Endoscopy;  Laterality: N/A;  NOT TOO EARLY   CYSTOSCOPY WITH STENT PLACEMENT Right 01/25/2016   Procedure: CYSTOSCOPY WITH STENT PLACEMENT;  Surgeon: Bart Born, MD;  Location: [facility] ORS;  Service: Urology;  Laterality: Right;   FRAMELESS  BIOPSY WITH BRAINLAB Right 01/17/2021   Procedure: RIGHT STEREOTACTIC BIOPSY OF INSULAR LESION;  Surgeon: Van Gelinas, MD;  Location: [facility] OR;  Service: Neurosurgery;  Laterality: Right;   KNEE ARTHROSCOPY W/ MENISCAL REPAIR Right 06/23/1988    POLYPECTOMY  01/07/2023   Procedure: POLYPECTOMY;  Surgeon: Marnee Sink, MD;  Location: [facility] ENDOSCOPY;  Service: Endoscopy;;   removal of birthmark  06/08/2013   SKIN CANCER EXCISION  06/23/2012   TYMPANOSTOMY TUBE PLACEMENT  08/06/1978   URETEROSCOPY WITH HOLMIUM LASER LITHOTRIPSY Right 01/25/2016   Procedure: URETEROSCOPY WITH HOLMIUM LASER LITHOTRIPSY;  Surgeon: Bart Born, MD;  Location: [facility] ORS;  Service: Urology;  Laterality: Right;   URETHRAL STRICTURE DILATATION     visual inspection of vocal cord     1973   WISDOM TOOTH EXTRACTION     Patient Active Problem List   Diagnosis Date Noted   Post-ictal aphasia 09/22/2023   CVA (cerebral vascular accident) (HCC) 09/20/2023   Abnormal finding on GI tract imaging 09/16/2023   Cerebrovascular accident (CVA) (HCC) 09/15/2023   Dizziness 09/15/2023   Dysphagia 09/14/2023   Gait disorder 09/14/2023   Seizure disorder (HCC) 09/10/2023   Uncontrolled type 2 diabetes mellitus with hypoglycemia, with long-term current use of insulin  (HCC) 09/10/2023   Acute CVA (cerebrovascular accident) (HCC) 09/10/2023   History of CVA (cerebrovascular accident) 09/09/2023   Essential hypertension 09/09/2023   Status epilepticus (HCC) 05/14/2023   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/2021   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   Focal seizures (HCC) 12/31/2020   Type 2 diabetes mellitus with  hyperglycemia (HCC) 06/12/2014   Hyperlipidemia associated with type 2 diabetes mellitus (HCC) 06/12/2014    ONSET DATE: Referral: 09/23/2023  Hospitalization 3/30 - 09/25/23  REFERRING DIAG: I63.9 (ICD-10-CM) - Cerebral infarction, unspecified R frontal oligodendroglioma  THERAPY DIAG:  Muscle weakness (generalized)  Other lack of coordination  Other disturbances of skin sensation  Other symptoms and signs involving the nervous system  Visuospatial deficit  Cerebrovascular accident  (CVA) due to thrombosis of precerebral artery (HCC)  Oligodendroglioma (HCC)  Rationale for Evaluation and Treatment: Rehabilitation  SUBJECTIVE:   SUBJECTIVE STATEMENT: Pt's spouse reports he was trying to clean the shower and was using toilet deodorizer instead of shower cleaner. The bottle had a picture of a toilet bowl on it. She endorses that the pt had recently moved the bottles of cleaner to a different location. Pt unable to fully relay why he chose that particular bottle. After the wife confronted the pt, he became frustrated and said he would just sit on the couch and watch TV the rest of his life.   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  Presented to the hospital 09/14/2023 with worsening gait left-sided weakness/aphasia and new onset dysphagia.  Of note, he was recently hospitalized from 3-19 to 3-21 with new CVA and breakthrough seizures.  In the ED he was started on IV fluids and MRI of the brain showed new punctate focus of restricted diffusion in the anterior insular cortex compatible with new acute/subacute infarct, stable 8 mm acute/subacute infarct involving the right cerebral peduncle, stable restricted diffusion in the anterior frontal lobe and along the atrium of the right lateral ventricle consistent with treated tumor, improved diffusion changes within the genu of the corpus callosum suggesting these were ischemic, stable chronic encephalomalacia of the right frontal operculum, and stable periventricular T2 hyperintensities in the left hemisphere likely reflects the sequela of chronic microvascular ischemia. He was started on Eliquis , AEDs were adjusted and admitted with evaluation by neurology and Dr. Mark Sil with neuro-oncology, because of recent decline determined to be multifocal secondary to  radiation necrosis, breakthrough seizures and multiple recent strokes.  Hospitalization complicated by dysphagia and GI determined to be neurologic.  Last known seizure 09/16/23, with subsequent increase nightly Lamictal .  Therapy evaluations completed due to patient decreased functional mobility was admitted for a comprehensive rehab program.  PRECAUTIONS: Fall  WEIGHT BEARING RESTRICTIONS: No  PAIN:  Are you having pain? No  FALLS: Has patient fallen in last 6 months? Yes. Number of falls 3 - trying to put pants on, ladder before stroke, carrying cat litter and lost balance on steps  LIVING ENVIRONMENT:  Home Living/Prior Functioning Home Living: Private residence Living Arrangements: Spouse - [name], Children - daughter [name], son 5yo Available Help at Discharge: Family, Available 24 hours/day Type of Home: House Home Access: Stairs to enter (stone steps) Entrance Stairs-Number of Steps: 4 Entrance Stairs-Rails: Right, Left Home Layout: Two level, Able to live on main level with bedroom/bathroom (can live on main level) Alternate Level Stairs-Number of Steps: flight of steps to 2nd floor bedroom Alternate Level Stairs-Rails:  (L for first 5 and R on second 5) Bathroom Shower/Tub: Psychologist, counselling, Door [employer] Toilet: Standard Bathroom Accessibility: Yes IADL History Homemaking Responsibilities: No Prior Function/Level of Independence: Independent with basic ADLs, Independent with transfers, Independent with gait, Independent with homemaking with ambulation Able to Take Stairs?:  Yes Driving: No (not driving for several years due to seizures)  PLOF: Independent with basic ADLs and Independent with household mobility without device Prior interests: Wrestled, ball room dance, soccer, professor of mathematics at [employer]  PATIENT GOALS: Pt reported he needs to work on L side awareness, vision on left side, strength and putting jacket on.  OBJECTIVE:  Note: Objective measures were  completed at Evaluation unless otherwise noted.  HAND DOMINANCE: Left  ADLs: Overall ADLs: Min assist to Mod Ind Transfers/ambulation related to ADLs: Mod Ind Eating: Pt switched to R hand for eating, wife helps cut food Grooming: Wife used clippers recently UB Dressing: Reports he struggles with button up shirt/jacket,  LB Dressing: Pt spouse report he fell with LB dressing in standing, wife confirms he can tie shoelaces Toileting: Ind Bathing: Mod I Tub Shower transfers: walk in shower with built in bench with B showerheads Equipment: none  IADLs: Shopping: wife Light housekeeping: wife Meal Prep: wife Community mobility: Supervision Medication management: Wife assists Financial management: Wife Handwriting: Increased time - due to aphasia (June 2024 aphasia, speech, reading)  MOBILITY STATUS: Hx of falls  POSTURE COMMENTS:  rounded shoulders and forward head Sitting balance: WFL  ACTIVITY TOLERANCE: Activity tolerance: Fair  FUNCTIONAL OUTCOME MEASURES: TBA  UPPER EXTREMITY ROM:    Active ROM Right eval Left eval  Shoulder flexion 120 90  Shoulder abduction    Shoulder adduction    Shoulder extension    Shoulder internal rotation    Shoulder external rotation    Elbow flexion WNL WFL  Elbow extension WNL WFL  Wrist flexion  Sight limits  Wrist extension    Wrist ulnar deviation    Wrist radial deviation    Wrist pronation    Wrist supination    (Blank rows = not tested)  UPPER EXTREMITY MMT:     MMT Right eval Left eval  Shoulder flexion 4 3-  Shoulder abduction    Shoulder adduction    Shoulder extension    Shoulder internal rotation    Shoulder external rotation    Middle trapezius    Lower trapezius    Elbow flexion 5 4+  Elbow extension 4+ 4  Wrist flexion    Wrist extension    Wrist ulnar deviation    Wrist radial deviation    Wrist pronation    Wrist supination    (Blank rows = not tested)  HAND FUNCTION: Grip strength: Right:  53.5, 57.5, 53.3  lbs; Left: 42.9, 51.8, 41.8  lbs Right: 54.8 lbs Left: 45.5 lbs  COORDINATION: 9 Hole Peg test: Right: 41.24  sec; Left: 1:04.00 sec  SENSATION: Patient initially reported normal but then noted he may have some limtations ie) does not know   EDEMA: NA  MUSCLE TONE: Some changes ie) not as smooth with movements and coordination  COGNITION: Overall cognitive status: Impaired/Aphasia impacts cognition Hospital: [facility] Summary Score: 12   VISION: Subjective report: January - vision was decreasing Baseline vision: Wears glasses for reading only about 1 year Visual history: changes were happening perhaps a precursor to CVA  MR review: Vision Baseline Vision/History: 1 Wears glasses Ability to See in Adequate Light: 1 Impaired Patient Visual Report: Peripheral vision impairment Vision Assessment?: Wears glasses for reading Eye Alignment: Within Functional Limits Alignment/Gaze Preference: Within Defined Limits Visual Fields: Left homonymous hemianopsia Additional Comments: L peripheral vision decr since January  VISION ASSESSMENT: Tracking/Visual pursuits: Impaired - pt has trouble with vision on L side  Patient has difficulty  with following activities due to following visual impairments: walks into walls, chairs etc, ie) doesn't always scan especially if he is tired, agitated, etc  10/22/23 -  R side peripheral vision: approx. 70* L side peripheral vision: visual field cut, unable to see marker until midline.  Pt reported difficulty seeing objects on L side.  PERCEPTION: Not tested  PRAXIS: Not tested                                                                                                                           TREATMENT :    Per subjective, OT educated pt and wife that pt could try to verify cleaning agents with family to ensure proper/safe use.   Therapist and patient participated in 1 game of Skip-Bo (cards 1-8 only) requiring patient to shuffle,  deal, hold cards, play cards onto table, and turn cards over with affected hand for improved coordination gross motor coordination, scanning and locating of items, processing, bimanual coordination/trunk control, sequencing of unfamiliar motor movements or tasks, motor planning, and item/pattern recognition.  Pt required max cues for completion.   PATIENT EDUCATION: Education details: activity modifications; Skip-Bo Person educated: Patient and Spouse Education method: Explanation, Demonstration, Tactile cues, and Verbal cues Education comprehension: verbalized understanding, returned demonstration, verbal cues required, tactile cues required, and needs further education  HOME EXERCISE PROGRAM: 10/20/2023: scanning activities; vision strategies 10/22/2023: PUTTY Access Code: [email or web address]   GOALS: Goals reviewed with patient? Yes  SHORT TERM GOALS: Target date: 11/20/23  Patient will demonstrate initial UE HEP with 25% verbal cues or less for proper execution (d/t B shoulder limitations and L sided weakness s/p CVA). Baseline: New to outpt OT Goal status: INITIAL   2.  Patient will demonstrate at least 5+ lbs improvement for BUE grip strength as needed to open jars and other containers. Baseline: Right: 54.8 lbs Left: 45.5 lbs Goal status: INITIAL   3.  Patient will demo improved FM coordination as evidenced by completing nine-hole peg 5+ seconds faster with use of BUE.  Baseline: 9 Hole Peg test: Right: 41.24  sec; Left: 1:04.00 sec Goal status: INITIAL   4.  Patient will independently recall at least 2 compensatory strategies for visual impairment without cueing. Baseline: New to outpt OT - bumps into furniture Goal status: INITIAL   5. Pt will verbalize understanding of modifications, strategies and/or equipment PRN with visual cues as needed to increase safety and independence with ADLs and IADLs (I.e. with decreased fall risk). Baseline: New to outpt OT - fell donning pants Goal  Status: INITIAL   LONG TERM GOALS: Target date: 12/18/23   Patient will demonstrate updated UE HEP with visual handouts only for proper execution.  Baseline: New to outpt OT  Goal status: INITIAL   2.  Patient will return demonstration of visual adaptation use and verbalize understanding of how to obtain item(s) if desired.  Baseline: New to outpt OT Goal status: INITIAL   3.  Patient will demo  improved FM coordination as evidenced by completing nine-hole peg with use of BUE as close to 30 seconds as possible.  Baseline: 9 Hole Peg test: Right: 41.24  sec; Left: 1:04.00 sec Goal status: INITIAL   4.  Pt will write/print his name/initials. Baseline: difficult due to aphasia by report Goal status: INITIAL   5.  Pt will report increased ease with donning all clothing items - shirt, socks/shoes without assistance of spouse Baseline: occasional assistance of spouse Goal status: INITIAL  ASSESSMENT:  CLINICAL IMPRESSION: Pt might benefit from daily schedule with pictures to help reduce errors in the home as well as minimize frustration with spouse. Will continue to explore activities that pt is willing to complete at home to encourage scanning of L visual field.   PERFORMANCE DEFICITS: in functional skills including ADLs, IADLs, coordination, dexterity, proprioception, sensation, tone, ROM, strength, pain, fascial restrictions, muscle spasms, flexibility, Fine motor control, Gross motor control, mobility, balance, body mechanics, endurance, decreased knowledge of precautions, decreased knowledge of use of DME, vision, and UE functional use, cognitive skills including attention, emotional, energy/drive, learn, memory, perception, problem solving, safety awareness, sequencing, temperament/personality, thought, and understand, and psychosocial skills including coping strategies, environmental adaptation, habits, interpersonal interactions, and routines and behaviors.   IMPAIRMENTS: are limiting  patient from ADLs, IADLs, rest and sleep, education, work, play, leisure, and social participation.   CO-MORBIDITIES: has co-morbidities such as h/o frontal oligodendroglioma  that affects occupational performance. Patient will benefit from skilled OT to address above impairments and improve overall function.  REHAB POTENTIAL: Good  PLAN:  OT FREQUENCY: 1-2x/week  OT DURATION: 8 weeks  PLANNED INTERVENTIONS: 97535 self care/ADL training, 16109 therapeutic exercise, 97530 therapeutic activity, 97112 neuromuscular re-education, 97140 manual therapy, balance training, functional mobility training, visual/perceptual remediation/compensation, psychosocial skills training, energy conservation, coping strategies training, patient/family education, and DME and/or AE instructions  RECOMMENDED OTHER SERVICES: NA - other therapies already scheduled at this time  CONSULTED AND AGREED WITH PLAN OF CARE: Patient and family member/caregiver  PLAN FOR NEXT SESSION:  Modified golf solitaire (only numbers) with 4 cards flipped over and bottom flipped face up  Daily/weekly picture schedule/cleaning caddies  Hallway balloon with sign recognition/response  Shoulder HEP  Visual scanning activities - encourage L side awareness (balloon + scanning) Review theraputty HEP HEP update: UE ROM (sh) and lower-level coordination (2-4 exercises) ADL comp strategies   Altamease Asters, OT 11/11/2023, 3:57 PM

## 2023-11-13 ENCOUNTER — Ambulatory Visit: Admitting: Occupational Therapy

## 2023-11-13 DIAGNOSIS — M6281 Muscle weakness (generalized): Secondary | ICD-10-CM

## 2023-11-13 DIAGNOSIS — I6300 Cerebral infarction due to thrombosis of unspecified precerebral artery: Secondary | ICD-10-CM

## 2023-11-13 DIAGNOSIS — C719 Malignant neoplasm of brain, unspecified: Secondary | ICD-10-CM

## 2023-11-13 DIAGNOSIS — R29818 Other symptoms and signs involving the nervous system: Secondary | ICD-10-CM

## 2023-11-13 DIAGNOSIS — R41841 Cognitive communication deficit: Secondary | ICD-10-CM

## 2023-11-13 DIAGNOSIS — R4701 Aphasia: Secondary | ICD-10-CM

## 2023-11-13 DIAGNOSIS — R208 Other disturbances of skin sensation: Secondary | ICD-10-CM

## 2023-11-13 DIAGNOSIS — R278 Other lack of coordination: Secondary | ICD-10-CM

## 2023-11-13 DIAGNOSIS — R41842 Visuospatial deficit: Secondary | ICD-10-CM

## 2023-11-13 NOTE — Patient Instructions (Signed)
 Shuffle cards  Create 6 piles of 4 cards with the last card face up  Flip over 1 card and place below the piles. This is your discard pile.  (you can only play cards on this pile)  You can play 1 card above the card or 1 card below the card on your discard pile. Suit or color does not matter.

## 2023-11-13 NOTE — Therapy (Addendum)
 OUTPATIENT OCCUPATIONAL THERAPY NEURO TREATMENT  Patient Name: Alan Wise MRN: [id number] DOB:Jul 09, 1970, 53 y.o., male Today's Date: 11/13/2023  PCP: Carollynn Cirri, NP REFERRING PROVIDER: Celia Coles, MD   END OF SESSION:  OT End of Session - 11/13/23 1021     Visit Number 7    Number of Visits 16    Date for OT Re-Evaluation 12/18/23    Authorization Type [employer] 2025 OOPM MET    Authorization Time Period Covered 100% VL:? Auth:?    OT Start Time 1020    OT Stop Time 1103    OT Time Calculation (min) 43 min    Activity Tolerance Patient tolerated treatment well    Behavior During Therapy WFL for tasks assessed/performed             Past Medical History:  Diagnosis Date   Allergy    Complication of anesthesia    pt reports he is starting to have more difficulty arousing after surgery   Diabetes mellitus (HCC)    GERD (gastroesophageal reflux disease)    Headache    History of kidney stones    Hyperlipidemia    Renal disorder    kidney stones   Past Surgical History:  Procedure Laterality Date   APPLICATION OF CRANIAL NAVIGATION Right 01/17/2021   Procedure: APPLICATION OF CRANIAL NAVIGATION;  Surgeon: Van Gelinas, MD;  Location: [facility] OR;  Service: Neurosurgery;  Laterality: Right;   COLONOSCOPY  09/03/1994   COLONOSCOPY WITH PROPOFOL  N/A 01/07/2023   Procedure: COLONOSCOPY WITH PROPOFOL ;  Surgeon: Marnee Sink, MD;  Location: [facility] ENDOSCOPY;  Service: Endoscopy;  Laterality: N/A;  NOT TOO EARLY   CYSTOSCOPY WITH STENT PLACEMENT Right 01/25/2016   Procedure: CYSTOSCOPY WITH STENT PLACEMENT;  Surgeon: Bart Born, MD;  Location: [facility] ORS;  Service: Urology;  Laterality: Right;   FRAMELESS  BIOPSY WITH BRAINLAB Right 01/17/2021   Procedure: RIGHT STEREOTACTIC BIOPSY OF INSULAR LESION;  Surgeon: Van Gelinas, MD;  Location: [facility] OR;  Service: Neurosurgery;  Laterality: Right;   KNEE ARTHROSCOPY W/ MENISCAL REPAIR Right 06/23/1988    POLYPECTOMY  01/07/2023   Procedure: POLYPECTOMY;  Surgeon: Marnee Sink, MD;  Location: [facility] ENDOSCOPY;  Service: Endoscopy;;   removal of birthmark  06/08/2013   SKIN CANCER EXCISION  06/23/2012   TYMPANOSTOMY TUBE PLACEMENT  08/06/1978   URETEROSCOPY WITH HOLMIUM LASER LITHOTRIPSY Right 01/25/2016   Procedure: URETEROSCOPY WITH HOLMIUM LASER LITHOTRIPSY;  Surgeon: Bart Born, MD;  Location: [facility] ORS;  Service: Urology;  Laterality: Right;   URETHRAL STRICTURE DILATATION     visual inspection of vocal cord     1973   WISDOM TOOTH EXTRACTION     Patient Active Problem List   Diagnosis Date Noted   Post-ictal aphasia 09/22/2023   CVA (cerebral vascular accident) (HCC) 09/20/2023   Abnormal finding on GI tract imaging 09/16/2023   Cerebrovascular accident (CVA) (HCC) 09/15/2023   Dizziness 09/15/2023   Dysphagia 09/14/2023   Gait disorder 09/14/2023   Seizure disorder (HCC) 09/10/2023   Uncontrolled type 2 diabetes mellitus with hypoglycemia, with long-term current use of insulin  (HCC) 09/10/2023   Acute CVA (cerebrovascular accident) (HCC) 09/10/2023   History of CVA (cerebrovascular accident) 09/09/2023   Essential hypertension 09/09/2023   Status epilepticus (HCC) 05/14/2023   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/2021   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   Focal seizures (HCC) 12/31/2020   Type 2 diabetes mellitus with hyperglycemia (HCC)  06/12/2014   Hyperlipidemia associated with type 2 diabetes mellitus (HCC) 06/12/2014    ONSET DATE: Referral: 09/23/2023  Hospitalization 3/30 - 09/25/23  REFERRING DIAG: I63.9 (ICD-10-CM) - Cerebral infarction, unspecified R frontal oligodendroglioma  THERAPY DIAG:  Muscle weakness (generalized)  Other lack of coordination  Other disturbances of skin sensation  Other symptoms and signs involving the nervous system  Visuospatial deficit  Cerebrovascular accident  (CVA) due to thrombosis of precerebral artery (HCC)  Oligodendroglioma (HCC)  Cognitive communication deficit  Aphasia  Rationale for Evaluation and Treatment: Rehabilitation  SUBJECTIVE:   SUBJECTIVE STATEMENT: Pt's mother states the pt is very upset that she took the wrong turn on their way here.   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  Presented to the hospital 09/14/2023 with worsening gait left-sided weakness/aphasia and new onset dysphagia.  Of note, he was recently hospitalized from 3-19 to 3-21 with new CVA and breakthrough seizures.  In the ED he was started on IV fluids and MRI of the brain showed new punctate focus of restricted diffusion in the anterior insular cortex compatible with new acute/subacute infarct, stable 8 mm acute/subacute infarct involving the right cerebral peduncle, stable restricted diffusion in the anterior frontal lobe and along the atrium of the right lateral ventricle consistent with treated tumor, improved diffusion changes within the genu of the corpus callosum suggesting these were ischemic, stable chronic encephalomalacia of the right frontal operculum, and stable periventricular T2 hyperintensities in the left hemisphere likely reflects the sequela of chronic microvascular ischemia. He was started on Eliquis , AEDs were adjusted and admitted with evaluation by neurology and Dr. Mark Sil with neuro-oncology, because of recent decline determined to be multifocal secondary to radiation necrosis, breakthrough seizures and multiple recent strokes.  Hospitalization complicated by dysphagia and GI determined to be neurologic.  Last known seizure 09/16/23, with subsequent increase nightly Lamictal .  Therapy evaluations completed due to patient decreased functional mobility was admitted for a  comprehensive rehab program.  PRECAUTIONS: Fall  WEIGHT BEARING RESTRICTIONS: No  PAIN:  Are you having pain? No  FALLS: Has patient fallen in last 6 months? Yes. Number of falls 3 - trying to put pants on, ladder before stroke, carrying cat litter and lost balance on steps  LIVING ENVIRONMENT:  Home Living/Prior Functioning Home Living: Private residence Living Arrangements: Spouse - [name], Children - daughter [name], son 5yo Available Help at Discharge: Family, Available 24 hours/day Type of Home: House Home Access: Stairs to enter (stone steps) Entrance Stairs-Number of Steps: 4 Entrance Stairs-Rails: Right, Left Home Layout: Two level, Able to live on main level with bedroom/bathroom (can live on main level) Alternate Level Stairs-Number of Steps: flight of steps to 2nd floor bedroom Alternate Level Stairs-Rails:  (L for first 5 and R on second 5) Bathroom Shower/Tub: Psychologist, counselling, Sport and exercise psychologist: Standard Bathroom Accessibility: Yes IADL History Homemaking Responsibilities: No Prior Function/Level of Independence: Independent with basic ADLs, Independent with transfers, Independent with gait, Independent with homemaking with ambulation Able to Take Stairs?: Yes Driving: No (not driving for several years due to seizures)  PLOF: Independent with basic ADLs and Independent with household mobility without device Prior interests: Wrestled, ball room dance, soccer, professor of mathematics at [employer]  PATIENT GOALS: Pt reported he needs to work on L side awareness, vision on left side, strength and putting jacket on.  OBJECTIVE:  Note:  Objective measures were completed at Evaluation unless otherwise noted.  HAND DOMINANCE: Left  ADLs: Overall ADLs: Min assist to Mod Ind Transfers/ambulation related to ADLs: Mod Ind Eating: Pt switched to R hand for eating, wife helps cut food Grooming: Wife used clippers recently UB Dressing: Reports he struggles with button up  shirt/jacket,  LB Dressing: Pt spouse report he fell with LB dressing in standing, wife confirms he can tie shoelaces Toileting: Ind Bathing: Mod I Tub Shower transfers: walk in shower with built in bench with B showerheads Equipment: none  IADLs: Shopping: wife Light housekeeping: wife Meal Prep: wife Community mobility: Supervision Medication management: Wife assists Financial management: Wife Handwriting: Increased time - due to aphasia (June 2024 aphasia, speech, reading)  MOBILITY STATUS: Hx of falls  POSTURE COMMENTS:  rounded shoulders and forward head Sitting balance: WFL  ACTIVITY TOLERANCE: Activity tolerance: Fair  FUNCTIONAL OUTCOME MEASURES: TBA  UPPER EXTREMITY ROM:    Active ROM Right eval Left eval  Shoulder flexion 120 90  Shoulder abduction    Shoulder adduction    Shoulder extension    Shoulder internal rotation    Shoulder external rotation    Elbow flexion WNL WFL  Elbow extension WNL WFL  Wrist flexion  Sight limits  Wrist extension    Wrist ulnar deviation    Wrist radial deviation    Wrist pronation    Wrist supination    (Blank rows = not tested)  UPPER EXTREMITY MMT:     MMT Right eval Left eval  Shoulder flexion 4 3-  Shoulder abduction    Shoulder adduction    Shoulder extension    Shoulder internal rotation    Shoulder external rotation    Middle trapezius    Lower trapezius    Elbow flexion 5 4+  Elbow extension 4+ 4  Wrist flexion    Wrist extension    Wrist ulnar deviation    Wrist radial deviation    Wrist pronation    Wrist supination    (Blank rows = not tested)  HAND FUNCTION: Grip strength: Right: 53.5, 57.5, 53.3  lbs; Left: 42.9, 51.8, 41.8  lbs Right: 54.8 lbs Left: 45.5 lbs  COORDINATION: 9 Hole Peg test: Right: 41.24  sec; Left: 1:04.00 sec  SENSATION: Patient initially reported normal but then noted he may have some limtations ie) does not know   EDEMA: NA  MUSCLE TONE: Some changes ie) not  as smooth with movements and coordination  COGNITION: Overall cognitive status: Impaired/Aphasia impacts cognition Hospital: [facility] Summary Score: 12   VISION: Subjective report: January - vision was decreasing Baseline vision: Wears glasses for reading only about 1 year Visual history: changes were happening perhaps a precursor to CVA  MR review: Vision Baseline Vision/History: 1 Wears glasses Ability to See in Adequate Light: 1 Impaired Patient Visual Report: Peripheral vision impairment Vision Assessment?: Wears glasses for reading Eye Alignment: Within Functional Limits Alignment/Gaze Preference: Within Defined Limits Visual Fields: Left homonymous hemianopsia Additional Comments: L peripheral vision decr since January  VISION ASSESSMENT: Tracking/Visual pursuits: Impaired - pt has trouble with vision on L side  Patient has difficulty with following activities due to following visual impairments: walks into walls, chairs etc, ie) doesn't always scan especially if he is tired, agitated, etc  10/22/23 -  R side peripheral vision: approx. 70* L side peripheral vision: visual field cut, unable to see marker until midline.  Pt reported difficulty seeing objects on L side.  PERCEPTION: Not tested  PRAXIS:  Not tested                                                                                                                           TREATMENT :    OT educated pt on table top play of modified Golf Solitaire for BUE ROM, coordination, visual processing, scanning, and sequencing. Pt required max cues for proper play but improved to min cueing following 3 rounds. Instructions provided as noted in pt instructions.    PATIENT EDUCATION: Education details: Passenger transport manager Person educated: Patient and Spouse Education method: Explanation, Demonstration, Actor cues, and Verbal cues Education comprehension: verbalized understanding, returned demonstration, verbal cues  required, tactile cues required, and needs further education  HOME EXERCISE PROGRAM: 10/20/2023: scanning activities; vision strategies 10/22/2023: PUTTY Access Code: [email or web address]   GOALS: Goals reviewed with patient? Yes  SHORT TERM GOALS: Target date: 11/20/23  Patient will demonstrate initial UE HEP with 25% verbal cues or less for proper execution (d/t B shoulder limitations and L sided weakness s/p CVA). Baseline: New to outpt OT Goal status: INITIAL   2.  Patient will demonstrate at least 5+ lbs improvement for BUE grip strength as needed to open jars and other containers. Baseline: Right: 54.8 lbs Left: 45.5 lbs Goal status: INITIAL   3.  Patient will demo improved FM coordination as evidenced by completing nine-hole peg 5+ seconds faster with use of BUE.  Baseline: 9 Hole Peg test: Right: 41.24  sec; Left: 1:04.00 sec Goal status: INITIAL   4.  Patient will independently recall at least 2 compensatory strategies for visual impairment without cueing. Baseline: New to outpt OT - bumps into furniture Goal status: INITIAL   5. Pt will verbalize understanding of modifications, strategies and/or equipment PRN with visual cues as needed to increase safety and independence with ADLs and IADLs (I.e. with decreased fall risk). Baseline: New to outpt OT - fell donning pants Goal Status: INITIAL   LONG TERM GOALS: Target date: 12/18/23   Patient will demonstrate updated UE HEP with visual handouts only for proper execution.  Baseline: New to outpt OT  Goal status: INITIAL   2.  Patient will return demonstration of visual adaptation use and verbalize understanding of how to obtain item(s) if desired.  Baseline: New to outpt OT Goal status: INITIAL   3.  Patient will demo improved FM coordination as evidenced by completing nine-hole peg with use of BUE as close to 30 seconds as possible.  Baseline: 9 Hole Peg test: Right: 41.24  sec; Left: 1:04.00 sec Goal status: INITIAL   4.  Pt will  write/print his name/initials. Baseline: difficult due to aphasia by report Goal status: INITIAL   5.  Pt will report increased ease with donning all clothing items - shirt, socks/shoes without assistance of spouse Baseline: occasional assistance of spouse Goal status: INITIAL  ASSESSMENT:  CLINICAL IMPRESSION: Pt requiring redirection and repetition throughout visit to participate in therapy. Will need to monitor safety within  home and with interactions with caregivers due to level of agitation and poor insight.  PERFORMANCE DEFICITS: in functional skills including ADLs, IADLs, coordination, dexterity, proprioception, sensation, tone, ROM, strength, pain, fascial restrictions, muscle spasms, flexibility, Fine motor control, Gross motor control, mobility, balance, body mechanics, endurance, decreased knowledge of precautions, decreased knowledge of use of DME, vision, and UE functional use, cognitive skills including attention, emotional, energy/drive, learn, memory, perception, problem solving, safety awareness, sequencing, temperament/personality, thought, and understand, and psychosocial skills including coping strategies, environmental adaptation, habits, interpersonal interactions, and routines and behaviors.   IMPAIRMENTS: are limiting patient from ADLs, IADLs, rest and sleep, education, work, play, leisure, and social participation.   CO-MORBIDITIES: has co-morbidities such as h/o frontal oligodendroglioma  that affects occupational performance. Patient will benefit from skilled OT to address above impairments and improve overall function.  REHAB POTENTIAL: Good  PLAN:  OT FREQUENCY: 1-2x/week  OT DURATION: 8 weeks  PLANNED INTERVENTIONS: 97535 self care/ADL training, 81191 therapeutic exercise, 97530 therapeutic activity, 97112 neuromuscular re-education, 97140 manual therapy, balance training, functional mobility training, visual/perceptual remediation/compensation, psychosocial  skills training, energy conservation, coping strategies training, patient/family education, and DME and/or AE instructions  RECOMMENDED OTHER SERVICES: NA - other therapies already scheduled at this time  CONSULTED AND AGREED WITH PLAN OF CARE: Patient and family member/caregiver  PLAN FOR NEXT SESSION:  Review modified golf solitaire (only numbers) with 6 piles of 4 and only the 4th cards in each pile flipped over. - see pt instructions  Daily/weekly picture schedule/cleaning caddies  Hallway balloon with sign recognition/response  Shoulder HEP  Visual scanning activities - encourage L side awareness (balloon + scanning) Review theraputty HEP HEP update: UE ROM (sh) and lower-level coordination (2-4 exercises) ADL comp strategies   Altamease Asters, OT 11/13/2023, 2:46 PM

## 2023-11-15 ENCOUNTER — Other Ambulatory Visit: Payer: Self-pay | Admitting: Internal Medicine

## 2023-11-17 ENCOUNTER — Encounter: Payer: Self-pay | Admitting: Internal Medicine

## 2023-11-17 ENCOUNTER — Ambulatory Visit (INDEPENDENT_AMBULATORY_CARE_PROVIDER_SITE_OTHER): Payer: 59 | Admitting: Internal Medicine

## 2023-11-17 VITALS — BP 150/90 | Ht 68.0 in | Wt 148.0 lb

## 2023-11-17 DIAGNOSIS — Z23 Encounter for immunization: Secondary | ICD-10-CM | POA: Diagnosis not present

## 2023-11-17 DIAGNOSIS — I10 Essential (primary) hypertension: Secondary | ICD-10-CM

## 2023-11-17 DIAGNOSIS — Z0001 Encounter for general adult medical examination with abnormal findings: Secondary | ICD-10-CM | POA: Diagnosis not present

## 2023-11-17 NOTE — Assessment & Plan Note (Signed)
 He refuses any needs to increase his amlodipine  or propranolol  knowing that his blood pressure is elevated given his history of stroke Discussed increased risk for significant stroke, heart attack and death

## 2023-11-17 NOTE — Progress Notes (Addendum)
 Subjective:    Patient ID: Alan Wise, male    DOB: 10/24/1970, 53 y.o.   MRN: [id number]  HPI  Patient presents to clinic today for his annual exam.   Flu: Never Tetanus: >10 years ago COVID: Janssen x 1 Shingrix: Never PSA screening: 10/2022 Colon screening: 12/2022 Vision screening: as needed Dentist: as needed  Diet: He does eat meat. He consumes fruits and veggies. He does eat some fried foods. He drinks mostly soda and water. Exercise: Walking  Review of Systems     Past Medical History:  Diagnosis Date   Allergy    Complication of anesthesia    pt reports he is starting to have more difficulty arousing after surgery   Diabetes mellitus (HCC)    GERD (gastroesophageal reflux disease)    Headache    History of kidney stones    Hyperlipidemia    Renal disorder    kidney stones    Current Outpatient Medications  Medication Sig Dispense Refill   acetaminophen  (TYLENOL ) 325 MG tablet Take 2 tablets (650 mg total) by mouth every 6 (six) hours as needed for mild pain (pain score 1-3) (or Fever >/= 101).     amLODipine  (NORVASC ) 5 MG tablet TAKE 1 TABLET (5 MG TOTAL) BY MOUTH DAILY. 30 tablet 2   ASPIRIN  LOW DOSE 81 MG tablet Take 81 mg by mouth daily.     atorvastatin  (LIPITOR) 40 MG tablet Take 1 tablet (40 mg total) by mouth daily. 30 tablet 0   cholecalciferol  (VITAMIN D3) 25 MCG (1000 UNIT) tablet Take 1 tablet (1,000 Units total) by mouth daily. 30 tablet 0   cyanocobalamin  1000 MCG tablet Take 1 tablet (1,000 mcg total) by mouth daily. 30 tablet 0   diazePAM , 15 MG Dose, 2 x 7.5 MG/0.1ML LQPK Place 15 mg into the nose once as needed for up to 1 dose (seizure). 2 each 0   divalproex  (DEPAKOTE ) 500 MG DR tablet Take 1 tablet (500 mg total) by mouth every 8 (eight) hours. 90 tablet 0   lamoTRIgine  (LAMICTAL ) 25 MG tablet Take 6 tablets (150 mg total) by mouth every morning AND 8 tablets (200 mg total) at bedtime. 420 tablet 0   losartan  (COZAAR ) 25 MG tablet  Take 1 tablet (25 mg total) by mouth daily. 30 tablet 0   propranolol  (INDERAL ) 20 MG tablet Take 1 tablet (20 mg total) by mouth 2 (two) times daily. 60 tablet 0   No current facility-administered medications for this visit.    Allergies  Allergen Reactions   Contrast Media [Iodinated Contrast Media] Swelling    Family History  Problem Relation Age of Onset   Hyperlipidemia Father    Hypertension Father    Diabetes Father    Benign prostatic hyperplasia Father    Stroke Maternal Grandmother    Diabetes Maternal Grandmother    Stroke Paternal Grandmother    Hypertension Paternal Grandmother    Kidney disease Neg Hx    Prostate cancer Neg Hx     Social History   Socioeconomic History   Marital status: Married    Spouse name: Not on file   Number of children: Not on file   Years of education: Not on file   Highest education level: Not on file  Occupational History   Not on file  Tobacco Use   Smoking status: Never   Smokeless tobacco: Never  Vaping Use   Vaping status: Never Used  Substance and Sexual Activity   Alcohol  use: Not Currently    Comment: rare   Drug use: No   Sexual activity: Not Currently  Other Topics Concern   Not on file  Social History Narrative   Not on file   Social Drivers of Health   Financial Resource Strain: Low Risk  (12/05/2022)   Received from [facility], [facility]   Overall Financial Resource Strain (CARDIA)    Difficulty of Paying Living Expenses: Not hard at all  Food Insecurity: No Food Insecurity (09/20/2023)   Hunger Vital Sign    Worried About Running Out of Food in the Last Year: Never true    Ran Out of Food in the Last Year: Never true  Transportation Needs: No Transportation Needs (09/20/2023)   PRAPARE - Administrator, Civil Service (Medical): No    Lack of Transportation (Non-Medical): No  Physical Activity: Not on file  Stress: Not on file  Social Connections:  Moderately Isolated (09/20/2023)   Social Connection and Isolation Panel [NHANES]    Frequency of Communication with Friends and Family: More than three times a week    Frequency of Social Gatherings with Friends and Family: More than three times a week    Attends Religious Services: Never    Database administrator or Organizations: No    Attends Banker Meetings: Never    Marital Status: Married  Catering manager Violence: Not At Risk (09/20/2023)   Humiliation, Afraid, Rape, and Kick questionnaire    Fear of Current or Ex-Partner: No    Emotionally Abused: No    Physically Abused: No    Sexually Abused: No     Constitutional: Denies fever, malaise, fatigue, headache or abrupt weight changes.  HEENT: Denies eye pain, eye redness, ear pain, ringing in the ears, wax buildup, runny nose, nasal congestion, bloody nose, or sore throat. Respiratory: Denies difficulty breathing, shortness of breath, cough or sputum production.   Cardiovascular: Denies chest pain, chest tightness, palpitations or swelling in the hands or feet.  Gastrointestinal: Denies abdominal pain, bloating, constipation, diarrhea or blood in the stool.  GU: Denies urgency, frequency, pain with urination, burning sensation, blood in urine, odor or discharge. Musculoskeletal: Pt reports left sided weakness. Denies decrease in range of motion, difficulty with gait, muscle pain or joint pain and swelling.  Skin: Denies redness, rashes, lesions or ulcercations.  Neurological: Patient reports some expressive aphasia.  Denies dizziness, difficulty with memory, difficulty with speech or problems with balance and coordination.  Psych: Pt reports anger outbursts. Denies anxiety, depression, SI/HI.  No other specific complaints in a complete review of systems (except as listed in HPI above).  Objective:   Physical Exam  BP (!) 158/92 (BP Location: Right Arm, Patient Position: Sitting, Cuff Size: Normal)   Ht 5\' 8"   (1.727 m)   Wt 148 lb (67.1 kg)   BMI 22.50 kg/m    Wt Readings from Last 3 Encounters:  10/20/23 148 lb (67.1 kg)  10/05/23 145 lb 4.8 oz (65.9 kg)  09/21/23 142 lb 3.2 oz (64.5 kg)    General: Appears his stated age, chronically ill appearing, in NAD. Skin: Warm, dry and intact. No ulcerations noted. HEENT: Head: normal shape and size; Eyes: sclera white, no icterus, conjunctiva pink, PERRLA and EOMs intact;  Neck:  Neck supple, trachea midline. No masses, lumps or thyromegaly present.  Cardiovascular: Normal rate and rhythm. S1,S2 noted.  No murmur, rubs or gallops noted. No JVD or BLE edema.  No carotid bruits noted. Pulmonary/Chest: Normal effort and positive vesicular breath sounds. No respiratory distress. No wheezes, rales or ronchi noted.  Abdomen: Normal bowel sounds. Musculoskeletal: Strength 4/5 LUE/LLE, 5/5 RUE/RLE.  No difficulty with gait.  Neurological: Alert and oriented.  He does have some difficulty finding his words.  Cranial nerves II-XII grossly intact. Coordination normal.  Psychiatric: Mood and affect mildly flat. Behavior is normal. Judgment and thought content normal.    BMET    Component Value Date/Time   NA 140 09/21/2023 0540   NA 138 05/04/2012 1815   K 4.3 09/21/2023 0540   K 3.5 05/04/2012 1815   CL 101 09/21/2023 0540   CL 99 05/04/2012 1815   CO2 25 09/21/2023 0540   CO2 29 05/04/2012 1815   GLUCOSE 102 (H) 09/21/2023 0540   GLUCOSE 69 05/04/2012 1815   BUN 13 09/21/2023 0540   BUN 12 05/04/2012 1815   CREATININE 1.03 09/21/2023 0540   CREATININE 0.95 08/19/2023 0818   CALCIUM  9.6 09/21/2023 0540   CALCIUM  9.2 05/04/2012 1815   GFRNONAA >60 09/21/2023 0540   GFRNONAA >60 05/22/2023 0936   GFRNONAA 106 12/05/2020 1014   GFRAA 123 12/05/2020 1014    Lipid Panel     Component Value Date/Time   CHOL 207 (H) 09/10/2023 0614   TRIG 116 09/10/2023 0614   HDL 28 (L) 09/10/2023 0614   CHOLHDL 7.4 09/10/2023 0614   VLDL 23 09/10/2023 0614    LDLCALC 156 (H) 09/10/2023 0614   LDLCALC 161 (H) 08/19/2023 0818    CBC    Component Value Date/Time   WBC 8.3 10/05/2023 0946   WBC 9.9 09/21/2023 0540   RBC 5.08 10/05/2023 0946   HGB 14.9 10/05/2023 0946   HGB 16.4 05/04/2012 1815   HCT 43.2 10/05/2023 0946   HCT 48.4 05/04/2012 1815   PLT 207 10/05/2023 0946   PLT 261 05/04/2012 1815   MCV 85.0 10/05/2023 0946   MCV 86 05/04/2012 1815   MCH 29.3 10/05/2023 0946   MCHC 34.5 10/05/2023 0946   RDW 12.5 10/05/2023 0946   RDW 12.7 05/04/2012 1815   LYMPHSABS 1.4 10/05/2023 0946   MONOABS 0.5 10/05/2023 0946   EOSABS 0.8 (H) 10/05/2023 0946   BASOSABS 0.1 10/05/2023 0946    Hgb A1C Lab Results  Component Value Date   HGBA1C 5.4 09/16/2023           Assessment & Plan:   Preventative Health Maintenance:  Encouraged him to get a flu shot in the fall Tdap today Encouraged him to get his COVID booster Discussed Shingrix vaccine, he will check coverage with his insurance company and schedule a visit if he would like to have this done Colon screening UTD Encouraged him to consume a balanced diet and exercise regimen Advised him to see an eye doctor and dentist annually He declines PSA today. Other labs reviewed  RTC in 6 months for follow-up of chronic conditions Helayne Lo, NP

## 2023-11-17 NOTE — Patient Instructions (Signed)
 Health Maintenance, Male  Adopting a healthy lifestyle and getting preventive care are important in promoting health and wellness. Ask your health care provider about:  The right schedule for you to have regular tests and exams.  Things you can do on your own to prevent diseases and keep yourself healthy.  What should I know about diet, weight, and exercise?  Eat a healthy diet    Eat a diet that includes plenty of vegetables, fruits, low-fat dairy products, and lean protein.  Do not eat a lot of foods that are high in solid fats, added sugars, or sodium.  Maintain a healthy weight  Body mass index (BMI) is a measurement that can be used to identify possible weight problems. It estimates body fat based on height and weight. Your health care provider can help determine your BMI and help you achieve or maintain a healthy weight.  Get regular exercise  Get regular exercise. This is one of the most important things you can do for your health. Most adults should:  Exercise for at least 150 minutes each week. The exercise should increase your heart rate and make you sweat (moderate-intensity exercise).  Do strengthening exercises at least twice a week. This is in addition to the moderate-intensity exercise.  Spend less time sitting. Even light physical activity can be beneficial.  Watch cholesterol and blood lipids  Have your blood tested for lipids and cholesterol at 52 years of age, then have this test every 5 years.  You may need to have your cholesterol levels checked more often if:  Your lipid or cholesterol levels are high.  You are older than 52 years of age.  You are at high risk for heart disease.  What should I know about cancer screening?  Many types of cancers can be detected early and may often be prevented. Depending on your health history and family history, you may need to have cancer screening at various ages. This may include screening for:  Colorectal cancer.  Prostate cancer.  Skin cancer.  Lung  cancer.  What should I know about heart disease, diabetes, and high blood pressure?  Blood pressure and heart disease  High blood pressure causes heart disease and increases the risk of stroke. This is more likely to develop in people who have high blood pressure readings or are overweight.  Talk with your health care provider about your target blood pressure readings.  Have your blood pressure checked:  Every 3-5 years if you are 9-95 years of age.  Every year if you are 85 years old or older.  If you are between the ages of 29 and 29 and are a current or former smoker, ask your health care provider if you should have a one-time screening for abdominal aortic aneurysm (AAA).  Diabetes  Have regular diabetes screenings. This checks your fasting blood sugar level. Have the screening done:  Once every three years after age 23 if you are at a normal weight and have a low risk for diabetes.  More often and at a younger age if you are overweight or have a high risk for diabetes.  What should I know about preventing infection?  Hepatitis B  If you have a higher risk for hepatitis B, you should be screened for this virus. Talk with your health care provider to find out if you are at risk for hepatitis B infection.  Hepatitis C  Blood testing is recommended for:  Everyone born from [birth year] through 1964.  Anyone  with known risk factors for hepatitis C.  Sexually transmitted infections (STIs)  You should be screened each year for STIs, including gonorrhea and chlamydia, if:  You are sexually active and are younger than 52 years of age.  You are older than 52 years of age and your health care provider tells you that you are at risk for this type of infection.  Your sexual activity has changed since you were last screened, and you are at increased risk for chlamydia or gonorrhea. Ask your health care provider if you are at risk.  Ask your health care provider about whether you are at high risk for HIV. Your health care provider  may recommend a prescription medicine to help prevent HIV infection. If you choose to take medicine to prevent HIV, you should first get tested for HIV. You should then be tested every 3 months for as long as you are taking the medicine.  Follow these instructions at home:  Alcohol use  Do not drink alcohol if your health care provider tells you not to drink.  If you drink alcohol:  Limit how much you have to 0-2 drinks a day.  Know how much alcohol is in your drink. In the [place] one drink equals one 12 oz bottle of beer (355 mL), one 5 oz glass of wine (148 mL), or one 1 oz glass of hard liquor (44 mL).  Lifestyle  Do not use any products that contain nicotine or tobacco. These products include cigarettes, chewing tobacco, and vaping devices, such as e-cigarettes. If you need help quitting, ask your health care provider.  Do not use street drugs.  Do not share needles.  Ask your health care provider for help if you need support or information about quitting drugs.  General instructions  Schedule regular health, dental, and eye exams.  Stay current with your vaccines.  Tell your health care provider if:  You often feel depressed.  You have ever been abused or do not feel safe at home.  Summary  Adopting a healthy lifestyle and getting preventive care are important in promoting health and wellness.  Follow your health care provider's instructions about healthy diet, exercising, and getting tested or screened for diseases.  Follow your health care provider's instructions on monitoring your cholesterol and blood pressure.  This information is not intended to replace advice given to you by your health care provider. Make sure you discuss any questions you have with your health care provider.  Document Revised: 10/29/2020 Document Reviewed: 10/29/2020  Elsevier Patient Education  2024 [employer].

## 2023-11-18 ENCOUNTER — Ambulatory Visit: Admitting: Occupational Therapy

## 2023-11-18 ENCOUNTER — Ambulatory Visit

## 2023-11-18 ENCOUNTER — Other Ambulatory Visit: Payer: Self-pay

## 2023-11-18 DIAGNOSIS — I6300 Cerebral infarction due to thrombosis of unspecified precerebral artery: Secondary | ICD-10-CM

## 2023-11-18 DIAGNOSIS — C719 Malignant neoplasm of brain, unspecified: Secondary | ICD-10-CM

## 2023-11-18 DIAGNOSIS — R208 Other disturbances of skin sensation: Secondary | ICD-10-CM

## 2023-11-18 DIAGNOSIS — M6281 Muscle weakness (generalized): Secondary | ICD-10-CM | POA: Diagnosis not present

## 2023-11-18 DIAGNOSIS — R29818 Other symptoms and signs involving the nervous system: Secondary | ICD-10-CM

## 2023-11-18 DIAGNOSIS — R2689 Other abnormalities of gait and mobility: Secondary | ICD-10-CM

## 2023-11-18 DIAGNOSIS — R41844 Frontal lobe and executive function deficit: Secondary | ICD-10-CM

## 2023-11-18 DIAGNOSIS — R41842 Visuospatial deficit: Secondary | ICD-10-CM

## 2023-11-18 DIAGNOSIS — R4701 Aphasia: Secondary | ICD-10-CM

## 2023-11-18 DIAGNOSIS — R278 Other lack of coordination: Secondary | ICD-10-CM

## 2023-11-18 NOTE — Therapy (Unsigned)
 OUTPATIENT OCCUPATIONAL THERAPY NEURO TREATMENT  Patient Name: Alan Wise MRN: [id number] DOB:[birth year], 53 y.o., male Today's Date: 11/18/2023  PCP: Carollynn Cirri, NP REFERRING PROVIDER: Celia Coles, MD  END OF SESSION:  OT End of Session - 11/18/23 1017     Visit Number 8    Number of Visits 16    Date for OT Re-Evaluation 12/18/23    Authorization Type [employer] 2025 OOPM MET    Authorization Time Period Covered 100% VL:? Auth:?    OT Start Time 1017    OT Stop Time 1100    OT Time Calculation (min) 43 min    Activity Tolerance Patient tolerated treatment well    Behavior During Therapy WFL for tasks assessed/performed            Past Medical History:  Diagnosis Date   Allergy    Complication of anesthesia    pt reports he is starting to have more difficulty arousing after surgery   Diabetes mellitus (HCC)    GERD (gastroesophageal reflux disease)    Headache    History of kidney stones    Hyperlipidemia    Renal disorder    kidney stones   Past Surgical History:  Procedure Laterality Date   APPLICATION OF CRANIAL NAVIGATION Right 01/17/2021   Procedure: APPLICATION OF CRANIAL NAVIGATION;  Surgeon: Van Gelinas, MD;  Location: [facility] OR;  Service: Neurosurgery;  Laterality: Right;   COLONOSCOPY  09/03/1994   COLONOSCOPY WITH PROPOFOL  N/A 01/07/2023   Procedure: COLONOSCOPY WITH PROPOFOL ;  Surgeon: Marnee Sink, MD;  Location: [facility] ENDOSCOPY;  Service: Endoscopy;  Laterality: N/A;  NOT TOO EARLY   CYSTOSCOPY WITH STENT PLACEMENT Right 01/25/2016   Procedure: CYSTOSCOPY WITH STENT PLACEMENT;  Surgeon: Bart Born, MD;  Location: [facility] ORS;  Service: Urology;  Laterality: Right;   FRAMELESS  BIOPSY WITH BRAINLAB Right 01/17/2021   Procedure: RIGHT STEREOTACTIC BIOPSY OF INSULAR LESION;  Surgeon: Van Gelinas, MD;  Location: [facility] OR;  Service: Neurosurgery;  Laterality: Right;   KNEE ARTHROSCOPY W/ MENISCAL REPAIR Right 06/23/1988    POLYPECTOMY  01/07/2023   Procedure: POLYPECTOMY;  Surgeon: Marnee Sink, MD;  Location: [facility] ENDOSCOPY;  Service: Endoscopy;;   removal of birthmark  06/08/2013   SKIN CANCER EXCISION  06/23/2012   TYMPANOSTOMY TUBE PLACEMENT  08/06/1978   URETEROSCOPY WITH HOLMIUM LASER LITHOTRIPSY Right 01/25/2016   Procedure: URETEROSCOPY WITH HOLMIUM LASER LITHOTRIPSY;  Surgeon: Bart Born, MD;  Location: [facility] ORS;  Service: Urology;  Laterality: Right;   URETHRAL STRICTURE DILATATION     visual inspection of vocal cord     1973   WISDOM TOOTH EXTRACTION     Patient Active Problem List   Diagnosis Date Noted   Gait disorder 09/14/2023   Seizure disorder (HCC) 09/10/2023   History of CVA (cerebrovascular accident) 09/09/2023   Essential hypertension 09/09/2023   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   Type 2 diabetes mellitus with hyperglycemia (HCC) 06/12/2014   Hyperlipidemia associated with type 2 diabetes mellitus (HCC) 06/12/2014    ONSET DATE: Referral: 09/23/2023  Hospitalization 3/30 - 09/25/23  REFERRING DIAG: I63.9 (ICD-10-CM) - Cerebral infarction, unspecified R frontal oligodendroglioma  THERAPY DIAG:  Muscle weakness (generalized)  Other lack of coordination  Other disturbances of skin sensation  Other symptoms and signs involving the nervous system  Visuospatial deficit  Cerebrovascular accident (CVA) due to thrombosis of precerebral artery (HCC)  Oligodendroglioma (HCC)  Aphasia  Frontal lobe  and executive function deficit  Rationale for Evaluation and Treatment: Rehabilitation  SUBJECTIVE:   SUBJECTIVE STATEMENT: Pt reports he has not tried to complete golf solitaire at home.   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  Presented to the  hospital 09/14/2023 with worsening gait left-sided weakness/aphasia and new onset dysphagia.  Of note, he was recently hospitalized from 3-19 to 3-21 with new CVA and breakthrough seizures.  In the ED he was started on IV fluids and MRI of the brain showed new punctate focus of restricted diffusion in the anterior insular cortex compatible with new acute/subacute infarct, stable 8 mm acute/subacute infarct involving the right cerebral peduncle, stable restricted diffusion in the anterior frontal lobe and along the atrium of the right lateral ventricle consistent with treated tumor, improved diffusion changes within the genu of the corpus callosum suggesting these were ischemic, stable chronic encephalomalacia of the right frontal operculum, and stable periventricular T2 hyperintensities in the left hemisphere likely reflects the sequela of chronic microvascular ischemia. He was started on Eliquis , AEDs were adjusted and admitted with evaluation by neurology and Dr. Mark Sil with neuro-oncology, because of recent decline determined to be multifocal secondary to radiation necrosis, breakthrough seizures and multiple recent strokes.  Hospitalization complicated by dysphagia and GI determined to be neurologic.  Last known seizure 09/16/23, with subsequent increase nightly Lamictal .  Therapy evaluations completed due to patient decreased functional mobility was admitted for a comprehensive rehab program.  PRECAUTIONS: Fall  WEIGHT BEARING RESTRICTIONS: No  PAIN:  Are you having pain? No  FALLS: Has patient fallen in last 6 months? Yes. Number of falls 3 - trying to put pants on, ladder before stroke, carrying cat litter and lost balance on steps  LIVING ENVIRONMENT:  Home Living/Prior Functioning Home Living: Private residence Living Arrangements: Spouse - [name], Children - daughter [name], son 5yo Available Help at Discharge: Family, Available 24 hours/day Type of Home: House Home Access: Stairs to enter  (stone steps) Entrance Stairs-Number of Steps: 4 Entrance Stairs-Rails: Right, Left Home Layout: Two level, Able to live on main level with bedroom/bathroom (can live on main level) Alternate Level Stairs-Number of Steps: flight of steps to 2nd floor bedroom Alternate Level Stairs-Rails:  (L for first 5 and R on second 5) Bathroom Shower/Tub: Psychologist, counselling, Sport and exercise psychologist: Standard Bathroom Accessibility: Yes IADL History Homemaking Responsibilities: No Prior Function/Level of Independence: Independent with basic ADLs, Independent with transfers, Independent with gait, Independent with homemaking with ambulation Able to Take Stairs?: Yes Driving: No (not driving for several years due to seizures)  PLOF: Independent with basic ADLs and Independent with household mobility without device Prior interests: Wrestled, ball room dance, soccer, professor of mathematics at [employer]  PATIENT GOALS: Pt reported he needs to work on L side awareness, vision on left side, strength and putting jacket on.  OBJECTIVE:  Note: Objective measures were completed at Evaluation unless otherwise noted.  HAND DOMINANCE: Left  ADLs: Overall ADLs: Min assist to Mod Ind Transfers/ambulation related to ADLs: Mod Ind Eating: Pt switched to R hand for eating, wife helps cut food Grooming: Wife used clippers recently UB Dressing: Reports he struggles with button up shirt/jacket,  LB Dressing: Pt spouse report he fell with LB dressing in standing, wife confirms he can tie shoelaces Toileting: Ind Bathing: Mod I Tub Shower transfers: walk in shower with built in bench  with B showerheads Equipment: none  IADLs: Shopping: wife Light housekeeping: wife Meal Prep: wife Community mobility: Supervision Medication management: Wife assists Financial management: Wife Handwriting: Increased time - due to aphasia (June 2024 aphasia, speech, reading)  MOBILITY STATUS: Hx of falls  POSTURE COMMENTS:  rounded  shoulders and forward head Sitting balance: WFL  ACTIVITY TOLERANCE: Activity tolerance: Fair  FUNCTIONAL OUTCOME MEASURES: TBA  UPPER EXTREMITY ROM:    Active ROM Right eval Left eval  Shoulder flexion 120 90  Shoulder abduction    Shoulder adduction    Shoulder extension    Shoulder internal rotation    Shoulder external rotation    Elbow flexion WNL WFL  Elbow extension WNL WFL  Wrist flexion  Sight limits  Wrist extension    Wrist ulnar deviation    Wrist radial deviation    Wrist pronation    Wrist supination    (Blank rows = not tested)  UPPER EXTREMITY MMT:     MMT Right eval Left eval  Shoulder flexion 4 3-  Shoulder abduction    Shoulder adduction    Shoulder extension    Shoulder internal rotation    Shoulder external rotation    Middle trapezius    Lower trapezius    Elbow flexion 5 4+  Elbow extension 4+ 4  Wrist flexion    Wrist extension    Wrist ulnar deviation    Wrist radial deviation    Wrist pronation    Wrist supination    (Blank rows = not tested)  HAND FUNCTION: Grip strength: Right: 53.5, 57.5, 53.3  lbs; Left: 42.9, 51.8, 41.8  lbs Right: 54.8 lbs Left: 45.5 lbs  COORDINATION: 9 Hole Peg test: Right: 41.24  sec; Left: 1:04.00 sec  SENSATION: Patient initially reported normal but then noted he may have some limtations ie) does not know   EDEMA: NA  MUSCLE TONE: Some changes ie) not as smooth with movements and coordination  COGNITION: Overall cognitive status: Impaired/Aphasia impacts cognition Hospital: [facility] Summary Score: 12   VISION: Subjective report: January - vision was decreasing Baseline vision: Wears glasses for reading only about 1 year Visual history: changes were happening perhaps a precursor to CVA  MR review: Vision Baseline Vision/History: 1 Wears glasses Ability to See in Adequate Light: 1 Impaired Patient Visual Report: Peripheral vision impairment Vision Assessment?: Wears glasses for reading Eye  Alignment: Within Functional Limits Alignment/Gaze Preference: Within Defined Limits Visual Fields: Left homonymous hemianopsia Additional Comments: L peripheral vision decr since January  VISION ASSESSMENT: Tracking/Visual pursuits: Impaired - pt has trouble with vision on L side  Patient has difficulty with following activities due to following visual impairments: walks into walls, chairs etc, ie) doesn't always scan especially if he is tired, agitated, etc  10/22/23 -  R side peripheral vision: approx. 70* L side peripheral vision: visual field cut, unable to see marker until midline.  Pt reported difficulty seeing objects on L side.  PERCEPTION: Not tested  PRAXIS: Not tested  TREATMENT :    OT educated pt on table top play of modified Golf Solitaire for BUE ROM, coordination, visual processing, scanning, and sequencing. Pt required moderate cues for proper play.     - Seated Shoulder Flexion AAROM with Dowel  - 1 x daily - 10 reps - 5 second hold - Standing Shoulder External Internal Rotation AAROM with Dowel  - 1 x daily - 10 reps - 5 seconds hold - Seated Shoulder Abduction AAROM with Dowel  - 1 x daily - 10 reps - 5 second hold  PATIENT EDUCATION: Education details: Passenger transport manager; Dowel HEP Person educated: Patient and Parent Education method: Explanation, Demonstration, Tactile cues, and Verbal cues Education comprehension: verbalized understanding, returned demonstration, verbal cues required, tactile cues required, and needs further education  HOME EXERCISE PROGRAM: 10/20/2023: scanning activities; vision strategies 10/22/2023: PUTTY Access Code: [email or web address] 11/18/2023: dowel HEP   GOALS: Goals reviewed with patient? Yes  SHORT TERM GOALS: Target date: 11/20/23  Patient will demonstrate initial UE HEP with 25% verbal cues or less for proper  execution (d/t B shoulder limitations and L sided weakness s/p CVA). Baseline: New to outpt OT Goal status: INITIAL   2.  Patient will demonstrate at least 5+ lbs improvement for BUE grip strength as needed to open jars and other containers. Baseline: Right: 54.8 lbs Left: 45.5 lbs Goal status: INITIAL   3.  Patient will demo improved FM coordination as evidenced by completing nine-hole peg 5+ seconds faster with use of BUE.  Baseline: 9 Hole Peg test: Right: 41.24  sec; Left: 1:04.00 sec Goal status: INITIAL   4.  Patient will independently recall at least 2 compensatory strategies for visual impairment without cueing. Baseline: New to outpt OT - bumps into furniture Goal status: INITIAL   5. Pt will verbalize understanding of modifications, strategies and/or equipment PRN with visual cues as needed to increase safety and independence with ADLs and IADLs (I.e. with decreased fall risk). Baseline: New to outpt OT - fell donning pants Goal Status: INITIAL   LONG TERM GOALS: Target date: 12/18/23   Patient will demonstrate updated UE HEP with visual handouts only for proper execution.  Baseline: New to outpt OT  Goal status: INITIAL   2.  Patient will return demonstration of visual adaptation use and verbalize understanding of how to obtain item(s) if desired.  Baseline: New to outpt OT Goal status: INITIAL   3.  Patient will demo improved FM coordination as evidenced by completing nine-hole peg with use of BUE as close to 30 seconds as possible.  Baseline: 9 Hole Peg test: Right: 41.24  sec; Left: 1:04.00 sec Goal status: INITIAL   4.  Pt will write/print his name/initials. Baseline: difficult due to aphasia by report Goal status: INITIAL   5.  Pt will report increased ease with donning all clothing items - shirt, socks/shoes without assistance of spouse Baseline: occasional assistance of spouse Goal status: INITIAL  ASSESSMENT:  CLINICAL IMPRESSION: Pt demonstrating  improved self regulation this visit. Will benefit from additional review to promote carryover and independence.  PERFORMANCE DEFICITS: in functional skills including ADLs, IADLs, coordination, dexterity, proprioception, sensation, tone, ROM, strength, pain, fascial restrictions, muscle spasms, flexibility, Fine motor control, Gross motor control, mobility, balance, body mechanics, endurance, decreased knowledge of precautions, decreased knowledge of use of DME, vision, and UE functional use, cognitive skills including attention, emotional, energy/drive, learn, memory, perception, problem solving, safety awareness, sequencing, temperament/personality, thought, and understand, and psychosocial skills including coping strategies, environmental  adaptation, habits, interpersonal interactions, and routines and behaviors.   IMPAIRMENTS: are limiting patient from ADLs, IADLs, rest and sleep, education, work, play, leisure, and social participation.   CO-MORBIDITIES: has co-morbidities such as h/o frontal oligodendroglioma  that affects occupational performance. Patient will benefit from skilled OT to address above impairments and improve overall function.  REHAB POTENTIAL: Good  PLAN:  OT FREQUENCY: 1-2x/week  OT DURATION: 8 weeks  PLANNED INTERVENTIONS: 97535 self care/ADL training, 16109 therapeutic exercise, 97530 therapeutic activity, 97112 neuromuscular re-education, 97140 manual therapy, balance training, functional mobility training, visual/perceptual remediation/compensation, psychosocial skills training, energy conservation, coping strategies training, patient/family education, and DME and/or AE instructions  RECOMMENDED OTHER SERVICES: NA - other therapies already scheduled at this time  CONSULTED AND AGREED WITH PLAN OF CARE: Patient and family member/caregiver  PLAN FOR NEXT SESSION:  Review modified golf solitaire (only numbers) with 6 piles of 4 and only the 4th cards in each pile  flipped over. - see pt instructions  Daily/weekly picture schedule/cleaning caddies  Hallway balloon with sign recognition/response  Shoulder HEP  Visual scanning activities - encourage L side awareness (balloon + scanning) Review theraputty HEP HEP update: UE ROM (sh) and lower-level coordination (2-4 exercises) ADL comp strategies  Altamease Asters, OT 11/18/2023, 6:03 PM

## 2023-11-18 NOTE — Therapy (Signed)
 OUTPATIENT PHYSICAL THERAPY NEURO TREATMENT    Patient Name: Alan Wise MRN: [id number] DOB:[birth year], 53 y.o., male Today's Date: 11/18/2023   PCP: Helayne Lo, NP REFERRING PROVIDER: Celia Coles, MD  END OF SESSION:  PT End of Session - 11/18/23 0847     Visit Number 6    Number of Visits 18    Date for PT Re-Evaluation 12/30/23    Authorization Type [employer]    PT Start Time 0845    PT Stop Time 0930    PT Time Calculation (min) 45 min    Equipment Utilized During Treatment Gait belt    Activity Tolerance Patient tolerated treatment well    Behavior During Therapy WFL for tasks assessed/performed;Agitated             Past Medical History:  Diagnosis Date   Allergy    Complication of anesthesia    pt reports he is starting to have more difficulty arousing after surgery   Diabetes mellitus (HCC)    GERD (gastroesophageal reflux disease)    Headache    History of kidney stones    Hyperlipidemia    Renal disorder    kidney stones   Past Surgical History:  Procedure Laterality Date   APPLICATION OF CRANIAL NAVIGATION Right 01/17/2021   Procedure: APPLICATION OF CRANIAL NAVIGATION;  Surgeon: Van Gelinas, MD;  Location: [facility] OR;  Service: Neurosurgery;  Laterality: Right;   COLONOSCOPY  09/03/1994   COLONOSCOPY WITH PROPOFOL  N/A 01/07/2023   Procedure: COLONOSCOPY WITH PROPOFOL ;  Surgeon: Marnee Sink, MD;  Location: [facility] ENDOSCOPY;  Service: Endoscopy;  Laterality: N/A;  NOT TOO EARLY   CYSTOSCOPY WITH STENT PLACEMENT Right 01/25/2016   Procedure: CYSTOSCOPY WITH STENT PLACEMENT;  Surgeon: Bart Born, MD;  Location: [facility] ORS;  Service: Urology;  Laterality: Right;   FRAMELESS  BIOPSY WITH BRAINLAB Right 01/17/2021   Procedure: RIGHT STEREOTACTIC BIOPSY OF INSULAR LESION;  Surgeon: Van Gelinas, MD;  Location: [facility] OR;  Service: Neurosurgery;  Laterality: Right;   KNEE ARTHROSCOPY W/ MENISCAL REPAIR Right 06/23/1988   POLYPECTOMY   01/07/2023   Procedure: POLYPECTOMY;  Surgeon: Marnee Sink, MD;  Location: [facility] ENDOSCOPY;  Service: Endoscopy;;   removal of birthmark  06/08/2013   SKIN CANCER EXCISION  06/23/2012   TYMPANOSTOMY TUBE PLACEMENT  08/06/1978   URETEROSCOPY WITH HOLMIUM LASER LITHOTRIPSY Right 01/25/2016   Procedure: URETEROSCOPY WITH HOLMIUM LASER LITHOTRIPSY;  Surgeon: Bart Born, MD;  Location: [facility] ORS;  Service: Urology;  Laterality: Right;   URETHRAL STRICTURE DILATATION     visual inspection of vocal cord     1973   WISDOM TOOTH EXTRACTION     Patient Active Problem List   Diagnosis Date Noted   Gait disorder 09/14/2023   Seizure disorder (HCC) 09/10/2023   History of CVA (cerebrovascular accident) 09/09/2023   Essential hypertension 09/09/2023   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 Pt and mom reports of no falls within last 6 months and they deny any near falls. Pt denies any pain.  Pt accompanied by: self and family member - mother [name]  PERTINENT HISTORY: past medical history of Allergy, Complication of anesthesia, Diabetes mellitus (HCC), GERD (gastroesophageal reflux disease), Headache, History of kidney stones, Hyperlipidemia, and  Renal disorder. 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  PAIN:  Are you having pain? No  PRECAUTIONS: Fall and Other: Seizures and L visual cut    PATIENT GOALS: Improve walking, balance                                                                                            TREATMENT DATE:   Reassessment performed today  [facility] PT Assessment - 11/18/23 0001       Functional Gait  Assessment   Gait Level Surface Walks 20 ft in less than 5.5 sec, no assistive devices, good speed, no evidence for imbalance, normal gait pattern, deviates no more than 6 in outside of the 12 in walkway width.    Change in Gait Speed Able to change speed, demonstrates mild gait deviations, deviates 6-10 in outside of the 12 in walkway width, or no gait deviations, unable to achieve a major change in velocity, or uses a change in velocity, or uses an assistive device.    Gait with Horizontal Head Turns Performs head turns smoothly with slight change in gait velocity (eg, minor disruption to smooth gait path), deviates 6-10 in outside 12 in walkway width, or uses an assistive device.    Gait with Vertical Head Turns Performs task with slight change in gait velocity (eg, minor disruption to smooth gait path), deviates 6 - 10 in outside 12 in walkway width or uses assistive device    Gait and Pivot Turn Pivot turns safely within 3 sec and stops quickly with no loss of balance.    Step Over Obstacle Is able to step over 2 stacked shoe boxes taped together (9 in total height) without changing gait speed. No evidence of imbalance.    Gait with Narrow Base of Support Ambulates 4-7 steps.    Gait with Eyes Closed Walks 20 ft, slow speed, abnormal gait pattern, evidence for imbalance, deviates 10-15 in outside 12 in walkway width. Requires more than 9 sec to ambulate 20 ft.    Ambulating Backwards Walks 20 ft, no assistive devices, good speed, no evidence for imbalance, normal gait    Steps  Two feet to a stair, must use rail.    Total Score 21             SLS on floor: 5 x 15" R and L, pt initially able to hold for 3 sec on L LE and has tendency to fall to left while standing on L LE. Placed 2 lbs on R UE while standing on L LE which improved his overall balance on L LE to 10+ sec.   Diagonal stepping with 4 dots on floor: fwd and bwd: 5x with randomly calling colors and pt cued to look  at floor to see where the dots are so he can step. Dots were placed 3 feet away from each other diagonally  Then dots were placed in diamond shape and pt was again to step but this time he could turn his head and body depending on which color was called out randomly. Practiced head turns, looking down, fwd, bwd stepping, diagonal stepping (fwd and bwd)- pt had one instance of fall which required PT assistance to regain balance.   6" hurdles placed in a position where they created a square and patient is standing int he square. Pt asked to step fwd over the hurdle, bwed and to the right and left randomly by PT. Cues given to turn head and body to ensure he is close enough to hurdle before stepping and he has enough clearance for foot without stepping on the hurdles.   Tandem walking with min to mod A: fwd direction only: 4 laps of 10 feet   PATIENT EDUCATION: Education details: continue HEP, see above, PT recommending 24/7 supervision Person educated: Patient and Parent Education method: Explanation, Demonstration, and Handouts Education comprehension: verbalized understanding and needs further education  HOME EXERCISE PROGRAM: Access Code: [email or web address] URL: [email or web address] Date: 10/28/2023 Prepared by: Camella Cave  Exercises - Half Deadlift with Kettlebell  - 1 x daily - 4 x weekly - 3 sets - 10 reps - Sit to Stand Without Arm Support  - 1 x daily - 4 x weekly - 3 sets - 10 reps  GOALS: Goals reviewed with patient? Yes  SHORT TERM GOALS: Target date: 11/10/2023  Pt  will demo >50% compliance with HEP to self manage symptoms. Baseline: TBD, reports doing HEP about 30% of the time Goal status: NOT MET   LONG TERM GOALS: Target date: 12/30/2023     Patient will demo 5x sit to stand score of <16 sec to improve overall functional strength. Baseline: 34 s without UE support; 19 seconds (11/18/23) Goal status: Progressing continue   2.  Pt will demo functional gait assessment score of >24/30 to improve overall balance with functional gait. Baseline: 20/30 (eval); 21/30 (11/18/23) Goal status: Progressing continue  3.  Pt will be able to perform reciprocal gait pattern when descending stairs with use of one rail Baseline: step to pattern when descending (leading with L LE) with one rail (10/13/23)- same as eval (11/18/23) Goal status: Progressing continue  4.  Patient will demo gait speed of >0.90 m/s to improve functional mobility in community. Baseline: TBD, 1.3 m/s  Goal status: MET  5.  Pt will demo improved overall safety awareness and decision making to avoid risk for fall and report no falls during course of 4 weeks consecutively. Baseline: Pt and mother self reported of no falls or near falls. Goal status: Goal met   ASSESSMENT:  CLINICAL IMPRESSION: Patient seen for skilled PT session for total of 6 sessions. Patient is progressing well and currently has met long term goals 4 and 5 and progressing well towards long term goals 1-3. Patient continues to demonstrate some cognitive and attention deficits that are intermittent which can lead to increased fall risk. Pt had multiple instances of near falls where he required PT's assistant to prevent fall, especially when physical task was combined with cognitive task combined with his balance deficits. Patient will continue to benefit from skilled PT to address these impairments and improve overall function.   OBJECTIVE IMPAIRMENTS: Abnormal gait, decreased activity tolerance, decreased balance,  decreased endurance, decreased knowledge of condition, decreased  mobility, difficulty walking, decreased strength, decreased safety awareness, impaired perceived functional ability, impaired tone, impaired UE functional use, and impaired vision/preception.   ACTIVITY LIMITATIONS: carrying, lifting, bending, standing, squatting, stairs, transfers, and locomotion level  PARTICIPATION LIMITATIONS: meal prep, cleaning, laundry, driving, shopping, community activity, occupation, and yard work  PERSONAL FACTORS: Time since onset of injury/illness/exacerbation and 3+ comorbidities: brain tumor, CVA, seizures are also affecting patient's functional outcome.   REHAB POTENTIAL: Good  CLINICAL DECISION MAKING: Stable/uncomplicated  EVALUATION COMPLEXITY: Low  PLAN:  PT FREQUENCY: 1-2x/week  PT DURATION: 6 weeks  PLANNED INTERVENTIONS: 97164- PT Re-evaluation, 97750- Physical Performance Testing, 97110-Therapeutic exercises, 97530- Therapeutic activity, 97112- Neuromuscular re-education, 97535- Self Care, 16109- Manual therapy, [phone]- Gait training, Patient/Family education, Balance training, Stair training, Joint mobilization, Visual/preceptual remediation/compensation, DME instructions, Cryotherapy, and Moist heat  PLAN FOR NEXT SESSION: Continue with skilled PT   Kristine Phalen, PT 11/18/2023, 1:26 PM

## 2023-11-20 ENCOUNTER — Ambulatory Visit

## 2023-11-20 ENCOUNTER — Ambulatory Visit: Admitting: Occupational Therapy

## 2023-11-20 ENCOUNTER — Ambulatory Visit: Admitting: Speech Pathology

## 2023-11-20 DIAGNOSIS — M6281 Muscle weakness (generalized): Secondary | ICD-10-CM | POA: Diagnosis not present

## 2023-11-20 DIAGNOSIS — R278 Other lack of coordination: Secondary | ICD-10-CM

## 2023-11-20 DIAGNOSIS — R208 Other disturbances of skin sensation: Secondary | ICD-10-CM

## 2023-11-20 DIAGNOSIS — R4701 Aphasia: Secondary | ICD-10-CM

## 2023-11-20 DIAGNOSIS — R29818 Other symptoms and signs involving the nervous system: Secondary | ICD-10-CM

## 2023-11-20 DIAGNOSIS — I6300 Cerebral infarction due to thrombosis of unspecified precerebral artery: Secondary | ICD-10-CM

## 2023-11-20 DIAGNOSIS — R2689 Other abnormalities of gait and mobility: Secondary | ICD-10-CM

## 2023-11-20 DIAGNOSIS — R41842 Visuospatial deficit: Secondary | ICD-10-CM

## 2023-11-20 DIAGNOSIS — C719 Malignant neoplasm of brain, unspecified: Secondary | ICD-10-CM

## 2023-11-20 DIAGNOSIS — R41844 Frontal lobe and executive function deficit: Secondary | ICD-10-CM

## 2023-11-20 DIAGNOSIS — R41841 Cognitive communication deficit: Secondary | ICD-10-CM

## 2023-11-20 NOTE — Therapy (Signed)
 OUTPATIENT PHYSICAL THERAPY NEURO TREATMENT    Patient Name: Alan Wise MRN: [id number] DOB:03/18/1971, 53 y.o., male Today's Date: 11/20/2023   PCP: Helayne Lo, NP REFERRING PROVIDER: Celia Coles, MD  END OF SESSION:  PT End of Session - 11/20/23 0851     Visit Number 7    Number of Visits 18    Date for PT Re-Evaluation 12/30/23    Authorization Type [employer]    PT Start Time 0845    PT Stop Time 0930    PT Time Calculation (min) 45 min    Equipment Utilized During Treatment Gait belt    Activity Tolerance Patient tolerated treatment well    Behavior During Therapy WFL for tasks assessed/performed;Agitated             Past Medical History:  Diagnosis Date   Allergy    Complication of anesthesia    pt reports he is starting to have more difficulty arousing after surgery   Diabetes mellitus (HCC)    GERD (gastroesophageal reflux disease)    Headache    History of kidney stones    Hyperlipidemia    Renal disorder    kidney stones   Past Surgical History:  Procedure Laterality Date   APPLICATION OF CRANIAL NAVIGATION Right 01/17/2021   Procedure: APPLICATION OF CRANIAL NAVIGATION;  Surgeon: Van Gelinas, MD;  Location: [facility] OR;  Service: Neurosurgery;  Laterality: Right;   COLONOSCOPY  09/03/1994   COLONOSCOPY WITH PROPOFOL  N/A 01/07/2023   Procedure: COLONOSCOPY WITH PROPOFOL ;  Surgeon: Marnee Sink, MD;  Location: [facility] ENDOSCOPY;  Service: Endoscopy;  Laterality: N/A;  NOT TOO EARLY   CYSTOSCOPY WITH STENT PLACEMENT Right 01/25/2016   Procedure: CYSTOSCOPY WITH STENT PLACEMENT;  Surgeon: Bart Born, MD;  Location: [facility] ORS;  Service: Urology;  Laterality: Right;   FRAMELESS  BIOPSY WITH BRAINLAB Right 01/17/2021   Procedure: RIGHT STEREOTACTIC BIOPSY OF INSULAR LESION;  Surgeon: Van Gelinas, MD;  Location: [facility] OR;  Service: Neurosurgery;  Laterality: Right;   KNEE ARTHROSCOPY W/ MENISCAL REPAIR Right 06/23/1988   POLYPECTOMY   01/07/2023   Procedure: POLYPECTOMY;  Surgeon: Marnee Sink, MD;  Location: [facility] ENDOSCOPY;  Service: Endoscopy;;   removal of birthmark  06/08/2013   SKIN CANCER EXCISION  06/23/2012   TYMPANOSTOMY TUBE PLACEMENT  08/06/1978   URETEROSCOPY WITH HOLMIUM LASER LITHOTRIPSY Right 01/25/2016   Procedure: URETEROSCOPY WITH HOLMIUM LASER LITHOTRIPSY;  Surgeon: Bart Born, MD;  Location: [facility] ORS;  Service: Urology;  Laterality: Right;   URETHRAL STRICTURE DILATATION     visual inspection of vocal cord     1973   WISDOM TOOTH EXTRACTION     Patient Active Problem List   Diagnosis Date Noted   Gait disorder 09/14/2023   Seizure disorder (HCC) 09/10/2023   History of CVA (cerebrovascular accident) 09/09/2023   Essential hypertension 09/09/2023   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 Pt reports no issues with walking on level ground or unlevel ground. Going up and down stairs are fine but he performs step to pattern. Picking things off the floor is mildly difficult.   Pt accompanied by: self and family member - mother [name]  PERTINENT HISTORY: past medical history of Allergy, Complication of anesthesia, Diabetes mellitus (HCC), GERD  (gastroesophageal reflux disease), Headache, History of kidney stones, Hyperlipidemia, and Renal disorder. 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  PAIN:  Are you having pain? No  PRECAUTIONS: Fall and Other: Seizures and L visual cut    PATIENT GOALS: Improve walking, balance                                                                                            TREATMENT DATE:   Reassessment performed today    SLS on floor: 5 x 15" R and L, Narrow BOS on floor: vertical and horizontal head turns: 5x each, too easy so progressed to partial tandem stance Partial tandem stance on floor: With R Leg forward, feet were space apart about foot width and with L LE forward, feet were touching each other: 2 x 10 R and L each Standing heel raises: 30x  With black sport cord laterally pulling patient to the side, pt steps fwd and laterally with contralateral foot on different colored dots: 20x R and L  Picking up 10lb slam ball from floor and then throwing it as far as he can forward and picking up and throwing it again: 115' lap  Pushing/rolling 1olb slam ball on floor with foot: alternating R and L for 115'     PATIENT EDUCATION: Education details: continue HEP, see above, PT recommending 24/7 supervision Person educated: Patient and Parent Education method: Explanation, Demonstration, and Handouts Education comprehension: verbalized understanding and needs further education  HOME EXERCISE PROGRAM: Access Code: [email or web address] URL: [email or web address] Date: 10/28/2023 Prepared by: Camella Cave  Exercises - Half Deadlift with Kettlebell  - 1 x daily - 4 x weekly - 3 sets - 10 reps - Sit to Stand Without Arm Support  - 1 x daily - 4 x weekly - 3 sets - 10 reps  GOALS: Goals reviewed with patient? Yes  SHORT TERM GOALS: Target date: 11/10/2023  Pt will demo >50% compliance with HEP to self manage symptoms. Baseline: TBD, reports doing  HEP about 30% of the time Goal status: NOT MET   LONG TERM GOALS: Target date: 12/30/2023     Patient will demo 5x sit to stand score of <16 sec to improve overall functional strength. Baseline: 34 s without UE support; 19 seconds (11/18/23) Goal status: Progressing continue   2.  Pt will demo functional gait assessment score of >24/30 to improve overall balance with functional gait. Baseline: 20/30 (eval); 21/30 (11/18/23) Goal status: Progressing continue  3.  Pt will be able to perform reciprocal gait pattern when descending stairs with use of one rail Baseline: step to pattern when descending (leading with L LE) with one rail (10/13/23)- same as eval (11/18/23) Goal status: Progressing continue  4.  Patient  will demo gait speed of >0.90 m/s to improve functional mobility in community. Baseline: TBD, 1.3 m/s  Goal status: MET  5.  Pt will demo improved overall safety awareness and decision making to avoid risk for fall and report no falls during course of 4 weeks consecutively. Baseline: Pt and mother self reported of no falls or near falls. Goal status: Goal met   ASSESSMENT:  CLINICAL IMPRESSION: Improved SLS today with decreased left lateral sway today. Pt demo increased difficulty with partial tandem with R leg forward compared to L leg forward and required wider BOS for stability and decrease fall risk (pt still has tendency to fall towards left). Pt was not given HEP for above balance due to safety awareness concerns and concerns with ability to grab the sink/countertop in time to prevent fall.  OBJECTIVE IMPAIRMENTS: Abnormal gait, decreased activity tolerance, decreased balance, decreased endurance, decreased knowledge of condition, decreased mobility, difficulty walking, decreased strength, decreased safety awareness, impaired perceived functional ability, impaired tone, impaired UE functional use, and impaired vision/preception.   ACTIVITY LIMITATIONS: carrying, lifting,  bending, standing, squatting, stairs, transfers, and locomotion level  PARTICIPATION LIMITATIONS: meal prep, cleaning, laundry, driving, shopping, community activity, occupation, and yard work  PERSONAL FACTORS: Time since onset of injury/illness/exacerbation and 3+ comorbidities: brain tumor, CVA, seizures are also affecting patient's functional outcome.   REHAB POTENTIAL: Good  CLINICAL DECISION MAKING: Stable/uncomplicated  EVALUATION COMPLEXITY: Low  PLAN:  PT FREQUENCY: 1-2x/week  PT DURATION: 6 weeks  PLANNED INTERVENTIONS: 97164- PT Re-evaluation, 97750- Physical Performance Testing, 97110-Therapeutic exercises, 97530- Therapeutic activity, 97112- Neuromuscular re-education, 97535- Self Care, [id number]- Manual therapy, [phone]- Gait training, Patient/Family education, Balance training, Stair training, Joint mobilization, Visual/preceptual remediation/compensation, DME instructions, Cryotherapy, and Moist heat  PLAN FOR NEXT SESSION: Continue with skilled PT   Kristine Phalen, PT 11/20/2023, 8:51 AM

## 2023-11-20 NOTE — Therapy (Signed)
 OUTPATIENT OCCUPATIONAL THERAPY NEURO TREATMENT  Patient Name: Alan Wise MRN: [id number] DOB:1970-11-02, 53 y.o., male Today's Date: 11/20/2023  PCP: Alan Cirri, NP REFERRING PROVIDER: Celia Coles, MD  END OF SESSION:  OT End of Session - 11/20/23 1020     Visit Number 9    Number of Visits 16    Date for OT Re-Evaluation 12/18/23    Authorization Type [employer] 2025 OOPM MET    Authorization Time Period Covered 100% VL:? Auth:?    OT Start Time 1018    OT Stop Time 1059    OT Time Calculation (min) 41 min    Activity Tolerance Patient tolerated treatment well    Behavior During Therapy WFL for tasks assessed/performed            Past Medical History:  Diagnosis Date   Allergy    Complication of anesthesia    pt reports he is starting to have more difficulty arousing after surgery   Diabetes mellitus (HCC)    GERD (gastroesophageal reflux disease)    Headache    History of kidney stones    Hyperlipidemia    Renal disorder    kidney stones   Past Surgical History:  Procedure Laterality Date   APPLICATION OF CRANIAL NAVIGATION Right 01/17/2021   Procedure: APPLICATION OF CRANIAL NAVIGATION;  Surgeon: Alan Gelinas, MD;  Location: [facility] OR;  Service: Neurosurgery;  Laterality: Right;   COLONOSCOPY  09/03/1994   COLONOSCOPY WITH PROPOFOL  N/A 01/07/2023   Procedure: COLONOSCOPY WITH PROPOFOL ;  Surgeon: Alan Sink, MD;  Location: [facility]COPY;  Service: Endoscopy;  Laterality: N/A;  NOT TOO EARLY   CYSTOSCOPY WITH STENT PLACEMENT Right 01/25/2016   Procedure: CYSTOSCOPY WITH STENT PLACEMENT;  Surgeon: Alan Born, MD;  Location: [facility] ORS;  Service: Urology;  Laterality: Right;   FRAMELESS  BIOPSY WITH BRAINLAB Right 01/17/2021   Procedure: RIGHT STEREOTACTIC BIOPSY OF INSULAR LESION;  Surgeon: Alan Gelinas, MD;  Location: [facility] OR;  Service: Neurosurgery;  Laterality: Right;   KNEE ARTHROSCOPY W/ MENISCAL REPAIR Right 06/23/1988    POLYPECTOMY  01/07/2023   Procedure: POLYPECTOMY;  Surgeon: Alan Sink, MD;  Location: [facility] ENDOSCOPY;  Service: Endoscopy;;   removal of birthmark  06/08/2013   SKIN CANCER EXCISION  06/23/2012   TYMPANOSTOMY TUBE PLACEMENT  08/06/1978   URETEROSCOPY WITH HOLMIUM LASER LITHOTRIPSY Right 01/25/2016   Procedure: URETEROSCOPY WITH HOLMIUM LASER LITHOTRIPSY;  Surgeon: Alan Born, MD;  Location: [facility] ORS;  Service: Urology;  Laterality: Right;   URETHRAL STRICTURE DILATATION     visual inspection of vocal cord     1973   WISDOM TOOTH EXTRACTION     Patient Active Problem List   Diagnosis Date Noted   Gait disorder 09/14/2023   Seizure disorder (HCC) 09/10/2023   History of CVA (cerebrovascular accident) 09/09/2023   Essential hypertension 09/09/2023   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   Type 2 diabetes mellitus with hyperglycemia (HCC) 06/12/2014   Hyperlipidemia associated with type 2 diabetes mellitus (HCC) 06/12/2014    ONSET DATE: Referral: 09/23/2023  Hospitalization 3/30 - 09/25/23  REFERRING DIAG: I63.9 (ICD-10-CM) - Cerebral infarction, unspecified R frontal oligodendroglioma  THERAPY DIAG:  Muscle weakness (generalized)  Cerebrovascular accident (CVA) due to thrombosis of precerebral artery (HCC)  Other lack of coordination  Other disturbances of skin sensation  Other symptoms and signs involving the nervous system  Visuospatial deficit  Oligodendroglioma (HCC)  Frontal lobe and executive  function deficit  Aphasia  Rationale for Evaluation and Treatment: Rehabilitation  SUBJECTIVE:   SUBJECTIVE STATEMENT: Pt states he struggles with shuffling but is better able to play the card game and was able to do so at home.   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  Presented to the hospital 09/14/2023 with worsening gait left-sided weakness/aphasia and new onset dysphagia.  Of note, he was recently hospitalized from 3-19 to 3-21 with new CVA and breakthrough seizures.  In the ED he was started on IV fluids and MRI of the brain showed new punctate focus of restricted diffusion in the anterior insular cortex compatible with new acute/subacute infarct, stable 8 mm acute/subacute infarct involving the right cerebral peduncle, stable restricted diffusion in the anterior frontal lobe and along the atrium of the right lateral ventricle consistent with treated tumor, improved diffusion changes within the genu of the corpus callosum suggesting these were ischemic, stable chronic encephalomalacia of the right frontal operculum, and stable periventricular T2 hyperintensities in the left hemisphere likely reflects the sequela of chronic microvascular ischemia. He was started on Eliquis , AEDs were adjusted and admitted with evaluation by neurology and Alan Wise with neuro-oncology, because of recent decline determined to be multifocal secondary to radiation necrosis, breakthrough seizures and multiple recent strokes.  Hospitalization complicated by dysphagia and GI determined to be neurologic.  Last known seizure 09/16/23, with subsequent increase nightly Lamictal .  Therapy evaluations completed due to patient decreased functional mobility was admitted for a comprehensive rehab program.  PRECAUTIONS: Fall  WEIGHT BEARING RESTRICTIONS: No  PAIN:  Are you having pain? No  FALLS: Has patient fallen in last 6 months? Yes. Number of falls 3 - trying to put pants on, ladder before stroke, carrying cat litter and lost balance on steps  LIVING ENVIRONMENT:  Home Living/Prior Functioning Home Living: Private residence Living Arrangements: Spouse - Alan Wise, Children - daughter Alan Wise, son 5yo Available Help at Discharge: Family, Available 24  hours/day Type of Home: House Home Access: Stairs to enter (stone steps) Entrance Stairs-Number of Steps: 4 Entrance Stairs-Rails: Right, Left Home Layout: Two level, Able to live on main level with bedroom/bathroom (can live on main level) Alternate Level Stairs-Number of Steps: flight of steps to 2nd floor bedroom Alternate Level Stairs-Rails:  (L for first 5 and R on second 5) Bathroom Shower/Tub: Psychologist, counselling, Sport and exercise psychologist: Standard Bathroom Accessibility: Yes IADL History Homemaking Responsibilities: No Prior Function/Level of Independence: Independent with basic ADLs, Independent with transfers, Independent with gait, Independent with homemaking with ambulation Able to Take Stairs?: Yes Driving: No (not driving for several years due to seizures)  PLOF: Independent with basic ADLs and Independent with household mobility without device Prior interests: Wrestled, ball room dance, soccer, professor of mathematics at [employer]  PATIENT GOALS: Pt reported he needs to work on L side awareness, vision on left side, strength and putting jacket on.  OBJECTIVE:  Note: Objective measures were completed at Evaluation unless otherwise noted.  HAND DOMINANCE: Left  ADLs: Overall ADLs: Min assist to Mod Ind Transfers/ambulation related to ADLs: Mod Ind Eating: Pt switched to R hand for eating, wife helps cut food Grooming: Wife used clippers recently UB Dressing: Reports he struggles with button up shirt/jacket,  LB Dressing: Pt spouse report he fell with LB dressing in standing, wife confirms he can tie shoelaces Toileting: Ind Bathing: Mod  I Tub Shower transfers: walk in shower with built in bench with B showerheads Equipment: none  IADLs: Shopping: wife Light housekeeping: wife Meal Prep: wife Community mobility: Supervision Medication management: Wife assists Financial management: Wife Handwriting: Increased time - due to aphasia (June 2024 aphasia, speech,  reading)  MOBILITY STATUS: Hx of falls  POSTURE COMMENTS:  rounded shoulders and forward head Sitting balance: WFL  ACTIVITY TOLERANCE: Activity tolerance: Fair  FUNCTIONAL OUTCOME MEASURES: TBA  UPPER EXTREMITY ROM:    Active ROM Right eval Left eval  Shoulder flexion 120 90  Shoulder abduction    Shoulder adduction    Shoulder extension    Shoulder internal rotation    Shoulder external rotation    Elbow flexion WNL WFL  Elbow extension WNL WFL  Wrist flexion  Sight limits  Wrist extension    Wrist ulnar deviation    Wrist radial deviation    Wrist pronation    Wrist supination    (Blank rows = not tested)  UPPER EXTREMITY MMT:     MMT Right eval Left eval  Shoulder flexion 4 3-  Shoulder abduction    Shoulder adduction    Shoulder extension    Shoulder internal rotation    Shoulder external rotation    Middle trapezius    Lower trapezius    Elbow flexion 5 4+  Elbow extension 4+ 4  Wrist flexion    Wrist extension    Wrist ulnar deviation    Wrist radial deviation    Wrist pronation    Wrist supination    (Blank rows = not tested)  HAND FUNCTION: Grip strength: Right: 53.5, 57.5, 53.3  lbs; Left: 42.9, 51.8, 41.8  lbs Right: 54.8 lbs Left: 45.5 lbs  COORDINATION: 9 Hole Peg test: Right: 41.24  sec; Left: 1:04.00 sec  SENSATION: Patient initially reported normal but then noted he may have some limtations ie) does not know   EDEMA: NA  MUSCLE TONE: Some changes ie) not as smooth with movements and coordination  COGNITION: Overall cognitive status: Impaired/Aphasia impacts cognition Hospital: [facility] Summary Score: 12   VISION: Subjective report: January - vision was decreasing Baseline vision: Wears glasses for reading only about 1 year Visual history: changes were happening perhaps a precursor to CVA  MR review: Vision Baseline Vision/History: 1 Wears glasses Ability to See in Adequate Light: 1 Impaired Patient Visual Report:  Peripheral vision impairment Vision Assessment?: Wears glasses for reading Eye Alignment: Within Functional Limits Alignment/Gaze Preference: Within Defined Limits Visual Fields: Left homonymous hemianopsia Additional Comments: L peripheral vision decr since January  VISION ASSESSMENT: Tracking/Visual pursuits: Impaired - pt has trouble with vision on L side  Patient has difficulty with following activities due to following visual impairments: walks into walls, chairs etc, ie) doesn't always scan especially if he is tired, agitated, etc  10/22/23 -  R side peripheral vision: approx. 70* L side peripheral vision: visual field cut, unable to see marker until midline.  Pt reported difficulty seeing objects on L side.  PERCEPTION: Not tested  PRAXIS: Not tested  TREATMENT :   - Therapeutic activities completed for duration as noted below including: OT reviewed table top play of modified Golf Solitaire for BUE ROM, coordination, visual processing, scanning, and sequencing. Pt required minimal cues for proper play, which is a significant improvement from last session.    - Therapeutic exercises completed for duration as noted below including: Wrist flex and ext with tan (12.5 lbs) flex bar x 2 min for strength and endurance of affected extremity  Supination with tan (12.5 lbs) flex bar x 2 min for strength and endurance of affected extremity  Pronation with tan (12.5 lbs) flex bar x 2 min for strength and endurance of affected extremity  OT reviewed dowel HEP as noted in pt instructions. Pt required mod cueing for proper completion.  - Self-care/home management completed for duration as noted below including: Objective measures assessed as noted in Goals section to determine progression towards goals.   PATIENT EDUCATION: Education details: Passenger transport manager;  dowel HEP; strengthening; goal progression Person educated: Patient and Spouse Education method: Explanation, Demonstration, Tactile cues, and Verbal cues Education comprehension: verbalized understanding, returned demonstration, verbal cues required, tactile cues required, and needs further education  HOME EXERCISE PROGRAM: 10/20/2023: scanning activities; vision strategies 10/22/2023: PUTTY Access Code: [email or web address]  11/18/2023: dowel HEP  GOALS: Goals reviewed with patient? Yes  SHORT TERM GOALS: Target date: 11/20/23  Patient will demonstrate initial UE HEP with 25% verbal cues or less for proper execution (d/t B shoulder limitations and L sided weakness s/p CVA). Baseline: New to outpt OT Goal status: IN PROGRESS   2.  Patient will demonstrate at least 5+ lbs improvement for BUE grip strength as needed to open jars and other containers. Baseline: Right: 54.8 lbs Left: 45.5 lbs 11/20/2023: 58.6 lbs Left: 49.3 lbs Goal status: IN PROGRESS   3.  Patient will demo improved FM coordination as evidenced by completing nine-hole peg 5+ seconds faster with use of BUE.  Baseline: 9 Hole Peg test: Right: 41.24  sec; Left: 1:04.00 sec 11/20/2023: Right: 36 sec; Left: 47 sec Goal status: MET   4.  Patient will independently recall at least 2 compensatory strategies for visual impairment without cueing. Baseline: New to outpt OT - bumps into furniture Goal status: INITIAL   5. Pt will verbalize understanding of modifications, strategies and/or equipment PRN with visual cues as needed to increase safety and independence with ADLs and IADLs (I.e. with decreased fall risk). Baseline: New to outpt OT - fell donning pants Goal Status: INITIAL   LONG TERM GOALS: Target date: 12/18/23   Patient will demonstrate updated UE HEP with visual handouts only for proper execution.  Baseline: New to outpt OT  Goal status: INITIAL   2.  Patient will return demonstration of visual adaptation use and verbalize  understanding of how to obtain item(s) if desired.  Baseline: New to outpt OT Goal status: INITIAL   3.  Patient will demo improved FM coordination as evidenced by completing nine-hole peg with use of BUE as close to 30 seconds as possible.  Baseline: 9 Hole Peg test: Right: 41.24  sec; Left: 1:04.00 sec Goal status: INITIAL   4.  Pt will write/print his name/initials. Baseline: difficult due to aphasia by report Goal status: INITIAL   5.  Pt will report increased ease with donning all clothing items - shirt, socks/shoes without assistance of spouse Baseline: occasional assistance of spouse Goal status: INITIAL  ASSESSMENT:  CLINICAL IMPRESSION: Pt demonstrating significant improvement with activity completion and understanding  of HEP. Progress towards goals noted with objective measurements.   PERFORMANCE DEFICITS: in functional skills including ADLs, IADLs, coordination, dexterity, proprioception, sensation, tone, ROM, strength, pain, fascial restrictions, muscle spasms, flexibility, Fine motor control, Gross motor control, mobility, balance, body mechanics, endurance, decreased knowledge of precautions, decreased knowledge of use of DME, vision, and UE functional use, cognitive skills including attention, emotional, energy/drive, learn, memory, perception, problem solving, safety awareness, sequencing, temperament/personality, thought, and understand, and psychosocial skills including coping strategies, environmental adaptation, habits, interpersonal interactions, and routines and behaviors.   IMPAIRMENTS: are limiting patient from ADLs, IADLs, rest and sleep, education, work, play, leisure, and social participation.   CO-MORBIDITIES: has co-morbidities such as h/o frontal oligodendroglioma  that affects occupational performance. Patient will benefit from skilled OT to address above impairments and improve overall function.  REHAB POTENTIAL: Good  PLAN:  OT FREQUENCY:  1-2x/week  OT DURATION: 8 weeks  PLANNED INTERVENTIONS: 97535 self care/ADL training, 16109 therapeutic exercise, 97530 therapeutic activity, 97112 neuromuscular re-education, 97140 manual therapy, balance training, functional mobility training, visual/perceptual remediation/compensation, psychosocial skills training, energy conservation, coping strategies training, patient/family education, and DME and/or AE instructions  RECOMMENDED OTHER SERVICES: NA - other therapies already scheduled at this time  CONSULTED AND AGREED WITH PLAN OF CARE: Patient and family member/caregiver  PLAN FOR NEXT SESSION:  Daily/weekly picture schedule/cleaning caddies  Hallway balloon with sign recognition/response  Visual scanning activities - encourage L side awareness (balloon + scanning) Review theraputty HEP HEP update: lower-level coordination (2-4 exercises) ADL comp strategies   Altamease Asters, OT 11/20/2023, 12:01 PM

## 2023-11-20 NOTE — Therapy (Signed)
 OUTPATIENT SPEECH LANGUAGE PATHOLOGY APHASIA TREATMENT   Patient Name: Alan Wise MRN: [id number] DOB:[birth year], 53 y.o., male Today's Date: 11/20/2023  PCP: Carollynn Cirri, NP REFERRING PROVIDER: Celia Coles, MD  END OF SESSION:  End of Session - 11/20/23 0936     Visit Number 6    Number of Visits 17    Date for SLP Re-Evaluation 12/16/23    Authorization Type [employer]    Progress Note Due on Visit 10    SLP Start Time 0935    SLP Stop Time  1015    SLP Time Calculation (min) 40 min    Activity Tolerance Patient tolerated treatment well             Past Medical History:  Diagnosis Date   Allergy    Complication of anesthesia    pt reports he is starting to have more difficulty arousing after surgery   Diabetes mellitus (HCC)    GERD (gastroesophageal reflux disease)    Headache    History of kidney stones    Hyperlipidemia    Renal disorder    kidney stones   Past Surgical History:  Procedure Laterality Date   APPLICATION OF CRANIAL NAVIGATION Right 01/17/2021   Procedure: APPLICATION OF CRANIAL NAVIGATION;  Surgeon: Van Gelinas, MD;  Location: [facility] OR;  Service: Neurosurgery;  Laterality: Right;   COLONOSCOPY  09/03/1994   COLONOSCOPY WITH PROPOFOL  N/A 01/07/2023   Procedure: COLONOSCOPY WITH PROPOFOL ;  Surgeon: Marnee Sink, MD;  Location: [facility] ENDOSCOPY;  Service: Endoscopy;  Laterality: N/A;  NOT TOO EARLY   CYSTOSCOPY WITH STENT PLACEMENT Right 01/25/2016   Procedure: CYSTOSCOPY WITH STENT PLACEMENT;  Surgeon: Bart Born, MD;  Location: [facility] ORS;  Service: Urology;  Laterality: Right;   FRAMELESS  BIOPSY WITH BRAINLAB Right 01/17/2021   Procedure: RIGHT STEREOTACTIC BIOPSY OF INSULAR LESION;  Surgeon: Van Gelinas, MD;  Location: [facility] OR;  Service: Neurosurgery;  Laterality: Right;   KNEE ARTHROSCOPY W/ MENISCAL REPAIR Right 06/23/1988   POLYPECTOMY  01/07/2023   Procedure: POLYPECTOMY;  Surgeon: Marnee Sink, MD;   Location: [facility] ENDOSCOPY;  Service: Endoscopy;;   removal of birthmark  06/08/2013   SKIN CANCER EXCISION  06/23/2012   TYMPANOSTOMY TUBE PLACEMENT  08/06/1978   URETEROSCOPY WITH HOLMIUM LASER LITHOTRIPSY Right 01/25/2016   Procedure: URETEROSCOPY WITH HOLMIUM LASER LITHOTRIPSY;  Surgeon: Bart Born, MD;  Location: [facility] ORS;  Service: Urology;  Laterality: Right;   URETHRAL STRICTURE DILATATION     visual inspection of vocal cord     1973   WISDOM TOOTH EXTRACTION     Patient Active Problem List   Diagnosis Date Noted   Gait disorder 09/14/2023   Seizure disorder (HCC) 09/10/2023   History of CVA (cerebrovascular accident) 09/09/2023   Essential hypertension 09/09/2023   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   PERTINENT HISTORY: Pt is a  53 y.o. year old male  who  has a past medical history of Allergy, Complication of anesthesia, Diabetes mellitus (HCC), GERD (  gastroesophageal reflux disease), Headache, History of kidney stones, Hyperlipidemia, and Renal disorder.  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  They presented to the hospital 09/14/23 with worsening gait, left-sided weakness, and new onset dysphagia.  Of note, he was recently hospitalized at home announce from 3-19 to 3-21 with new CVA and breakthrough seizures.  In the ER, he was started on IV fluids and MRI brain showed new punctate focus of restricted  diffusion in the anterior insular cortex compatible with a new acute/subacute infarct, stable 8 mm acute/subacute infarct involving the right cerebral peduncle, stable restricted diffusion in the anterior right frontal lobe and along the atrium of the right lateral ventricle consistent with treated tumor. Known to use from Speech Evaluation prior to CVA 09/09/23, however no therapy started due to hospitalization 09/09/23.   PAIN:  Are you having pain? No                                                                                                                            TREATMENT DATE:   11/20/23: Introduced alphabet board for use of self-cueing for anomia, as pt previously showed benefit from first level cue. 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 ABC board effective in x3 opportunities, SLP provision of first letter effective in x6 opportunities, x2 instances of using ABC tiles to generate target word (population, algebra). Pt demonstrating good responsiveness to letter cueing. Occasional benefit from phonemic cues, as needed. Reduced benefit from semantic cues this date.   11/06/23: Reviewed purpose of communication book, target employment for discussion of every day subjects. Using written aid, pt does successfully communication response to SLP questions in 4/5 trials though evidencing vague language, empty speech. Target generative naming with personally relevant topics. 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 X1 attempts of pt providing description of anomic target. Demonstration of phonemic cueing provided to aid in anomia recovery.  Education provided to [place] regarding benefit of receiving cues from communication partners to aid in recovery.   11/04/23: They left communication book at home - will bring it next session. Targeted simple sentence generation with fill in the blank verb (s-o-v) in personally relevant sentences. Collins required frequent written choice of 2 to complete 8/10 sentences. Targeted divergent naming in personally relevant categories (baking, items in his garage, his degrees and schools) - He required visual organization (topic, then numbers) and frequent 1st letter cues, choice of 2 to name 5-7 items in each category.   10/28/23: Targeted naming with Star Wars words - he generated 15 with frequent mod semantic and 1st letter cues. Targeted written expression writing the words  with frequent mod A for error awareness and self correction. Targeted verbal compensations for aphasia generating 3-4 descriptions of 2 fast food restaurants ([employer] and [employer]) with frequent mod to max verbal, visual cues (looking at the menu) to generate descriptions as well as his orders - consistent extended time;  10/26/23: Isaah enters with communication notebook he and his wife have generated. He is requesting he words be moved to the right of the page rather than centered. 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 Targeted verbal compensations for aphasia generated 3-4 descriptions of family members. With visual organizer of Name with 1-4 numbered below to support attention to task and with usual mod questioning cues and verbal cues, he generated 4 descriptions of his daughter. Targeted divergent naming in personally relevant category (ballroom dancing) with extended time and  occasional min 1st letter or semantic cue, Zaylyn named 10 ballroom dances.   10/21/23: Evaluation completed - see above. [name] brought communication notebook we started to the hospital however it was lost. Reviewed what to include in communication support book - she agrees to re-print the pages, adding restaurant orders, bullet points about friends and family, places they frequent. Cued them to think about people, places, items the encounter daily, weekly and monthly - generated ideas and words for book - See patient instructions. Generated home practice of naming items around the home, children's activities, school subjects they study, upcoming activities. Instructed them to also practice the words in his communication support book as these are the most personally relevant. In  divergent naming task (school subjects) Jaremy benefits from Topic and numbered visual cues to remain on task and avoid tangential speech/language. Spouse endorses they have a white board at home and will use this strategy during naming tasks at home.     PATIENT EDUCATION: Education details: See Treatment; See Patient Instructions, compensations for aphasia, communication supports, language activities to do at home Person educated: Patient and Spouse Education method: Explanation, Demonstration, Verbal cues, and Handouts Education comprehension: verbalized understanding, returned demonstration, verbal cues required, and needs further education   GOALS: Goals reviewed with patient? Yes - 11/18/23 - target date Pt will use multimodal communication to communicate family names and biographical information with rare min A Baseline: Goal status: ONGOING   2.  Pt and spouse will generate 4 personally relevant places and orders to add to communication support book with occasional min A Baseline:  Goal status: ONGOING   3.  Pt will use multimodal communication to name 8 items in a personally relevant category with  occasional min A Baseline:  Goal status: ONGOING   4.  Pt will use external aids to attend to the left when reading sentences  Baseline:  Goal status: ONGOING   5.  Pt will use verbal compensations for aphasia in structured tasks with occasional min A Baseline:  Goal status: ONGOING       LONG TERM GOALS: Target date: 12/16/23   Pt will use communication support notebook and scripting to place order at restaurant and to make 2 business calls Baseline:  Goal status: ONGOING   2.  Pt will use multimodal communication to communicate preferences in simple conversation Baseline:  Goal status: ONGOING   3.  Pt and  spouse will carryover 3 compensatory strategies to support pt's auditory comprehension Baseline:  Goal status: ONGOING   4.  Pt will generate moderately complex sentences in structured task such as VNeST with usual min A Baseline:  Goal status:ONGOING   5.  Pt and spouse will carryover 1-2 language enhancing activities at home daily 5/7 days Baseline:  Goal status: ONGOING   6.  Pt and spouse will add 6 new topics/words to communication support notebook Baseline:  Goal status: ONGOING   ASSESSMENT:  CLINICAL IMPRESSION: Patient is a 53 y.o. male who was seen today for moderate aphasia c/b empty, vague language, halting for word finding difficulties. Auditory comprehension impairments noted in conversation gathering history. Written expression impaired at phrase level, spouse endorses texts often don't make sense. "He texts like he speaks". Due to aphasia, Cas plans to retire at the end of this semester. He had difficulty stating spouse and children's names and his address. He endorses significant frustration.  They do endorse some attempts at multimodal communication with inconsistent success. After prior evaluation, they generated communication support book, however this was lost.  I recommend skilled ST to maximize communication for safety, to reduce caregiver  burden and improve QOL.   OBJECTIVE IMPAIRMENTS: include aphasia. These impairments are limiting patient from return to work, managing medications, managing appointments, managing finances, ADLs/IADLs, and effectively communicating at home and in community. Factors affecting potential to achieve goals and functional outcome are co-morbidities and severity of impairments. Patient will benefit from skilled SLP services to address above impairments and improve overall function.  REHAB POTENTIAL: Good  PLAN:  SLP FREQUENCY: 2x/week  SLP DURATION: 8 weeks  PLANNED INTERVENTIONS: Language facilitation, Environmental controls, Cueing hierachy, Cognitive reorganization, Internal/external aids, Functional tasks, Multimodal communication approach, SLP instruction and feedback, Compensatory strategies, Patient/family education, [phone] Treatment of speech (30 or 45 min) , and 13086- Speech Eval Sound Prod, Artic, Phon, Eval Compre, Express    Alston Jerry, CCC-SLP 11/20/2023, 9:36 AM

## 2023-11-23 ENCOUNTER — Ambulatory Visit

## 2023-11-23 ENCOUNTER — Ambulatory Visit: Attending: Physical Medicine and Rehabilitation | Admitting: Occupational Therapy

## 2023-11-23 DIAGNOSIS — R2689 Other abnormalities of gait and mobility: Secondary | ICD-10-CM | POA: Diagnosis present

## 2023-11-23 DIAGNOSIS — R208 Other disturbances of skin sensation: Secondary | ICD-10-CM | POA: Diagnosis present

## 2023-11-23 DIAGNOSIS — R41842 Visuospatial deficit: Secondary | ICD-10-CM | POA: Insufficient documentation

## 2023-11-23 DIAGNOSIS — R41844 Frontal lobe and executive function deficit: Secondary | ICD-10-CM | POA: Insufficient documentation

## 2023-11-23 DIAGNOSIS — R278 Other lack of coordination: Secondary | ICD-10-CM | POA: Insufficient documentation

## 2023-11-23 DIAGNOSIS — I6300 Cerebral infarction due to thrombosis of unspecified precerebral artery: Secondary | ICD-10-CM | POA: Insufficient documentation

## 2023-11-23 DIAGNOSIS — R29818 Other symptoms and signs involving the nervous system: Secondary | ICD-10-CM | POA: Diagnosis present

## 2023-11-23 DIAGNOSIS — M6281 Muscle weakness (generalized): Secondary | ICD-10-CM | POA: Diagnosis present

## 2023-11-23 DIAGNOSIS — R4701 Aphasia: Secondary | ICD-10-CM | POA: Insufficient documentation

## 2023-11-23 DIAGNOSIS — R41841 Cognitive communication deficit: Secondary | ICD-10-CM | POA: Insufficient documentation

## 2023-11-23 NOTE — Therapy (Signed)
 OUTPATIENT OCCUPATIONAL THERAPY NEURO TREATMENT  Patient Name: Alan Wise MRN: [id number] DOB:1971/03/29, 53 y.o., male Today's Date: 11/23/2023  PCP: Carollynn Cirri, NP REFERRING PROVIDER: Celia Coles, MD  END OF SESSION:  OT End of Session - 11/23/23 1014     Visit Number 10    Number of Visits 16    Date for OT Re-Evaluation 12/18/23    Authorization Type [employer] 2025 OOPM MET    Authorization Time Period Covered 100% VL:? Auth:?    OT Start Time 1015    OT Stop Time 1100    OT Time Calculation (min) 45 min    Activity Tolerance Patient tolerated treatment well    Behavior During Therapy WFL for tasks assessed/performed            Past Medical History:  Diagnosis Date   Allergy    Complication of anesthesia    pt reports he is starting to have more difficulty arousing after surgery   Diabetes mellitus (HCC)    GERD (gastroesophageal reflux disease)    Headache    History of kidney stones    Hyperlipidemia    Renal disorder    kidney stones   Past Surgical History:  Procedure Laterality Date   APPLICATION OF CRANIAL NAVIGATION Right 01/17/2021   Procedure: APPLICATION OF CRANIAL NAVIGATION;  Surgeon: Van Gelinas, MD;  Location: [facility] OR;  Service: Neurosurgery;  Laterality: Right;   COLONOSCOPY  09/03/1994   COLONOSCOPY WITH PROPOFOL  N/A 01/07/2023   Procedure: COLONOSCOPY WITH PROPOFOL ;  Surgeon: Marnee Sink, MD;  Location: [facility] ENDOSCOPY;  Service: Endoscopy;  Laterality: N/A;  NOT TOO EARLY   CYSTOSCOPY WITH STENT PLACEMENT Right 01/25/2016   Procedure: CYSTOSCOPY WITH STENT PLACEMENT;  Surgeon: Bart Born, MD;  Location: [facility] ORS;  Service: Urology;  Laterality: Right;   FRAMELESS  BIOPSY WITH BRAINLAB Right 01/17/2021   Procedure: RIGHT STEREOTACTIC BIOPSY OF INSULAR LESION;  Surgeon: Van Gelinas, MD;  Location: [facility] OR;  Service: Neurosurgery;  Laterality: Right;   KNEE ARTHROSCOPY W/ MENISCAL REPAIR Right 06/23/1988    POLYPECTOMY  01/07/2023   Procedure: POLYPECTOMY;  Surgeon: Marnee Sink, MD;  Location: [facility] ENDOSCOPY;  Service: Endoscopy;;   removal of birthmark  06/08/2013   SKIN CANCER EXCISION  06/23/2012   TYMPANOSTOMY TUBE PLACEMENT  08/06/1978   URETEROSCOPY WITH HOLMIUM LASER LITHOTRIPSY Right 01/25/2016   Procedure: URETEROSCOPY WITH HOLMIUM LASER LITHOTRIPSY;  Surgeon: Bart Born, MD;  Location: [facility] ORS;  Service: Urology;  Laterality: Right;   URETHRAL STRICTURE DILATATION     visual inspection of vocal cord     1973   WISDOM TOOTH EXTRACTION     Patient Active Problem List   Diagnosis Date Noted   Gait disorder 09/14/2023   Seizure disorder (HCC) 09/10/2023   History of CVA (cerebrovascular accident) 09/09/2023   Essential hypertension 09/09/2023   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   Type 2 diabetes mellitus with hyperglycemia (HCC) 06/12/2014   Hyperlipidemia associated with type 2 diabetes mellitus (HCC) 06/12/2014    ONSET DATE: Referral: 09/23/2023  Hospitalization 3/30 - 09/25/23  REFERRING DIAG: I63.9 (ICD-10-CM) - Cerebral infarction, unspecified R frontal oligodendroglioma  THERAPY DIAG:  Muscle weakness (generalized)  Other lack of coordination  Visuospatial deficit  Other symptoms and signs involving the nervous system  Frontal lobe and executive function deficit  Rationale for Evaluation and Treatment: Rehabilitation  SUBJECTIVE:   SUBJECTIVE STATEMENT:  Pt reports he is  only taking 4/7 medications but unable to report which ones he is not taking.  Pt reports his vision has been about the same with ongoing limitations on his L side.  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  Presented to the hospital 09/14/2023 with worsening  gait left-sided weakness/aphasia and new onset dysphagia.  Of note, he was recently hospitalized from 3-19 to 3-21 with new CVA and breakthrough seizures.  In the ED he was started on IV fluids and MRI of the brain showed new punctate focus of restricted diffusion in the anterior insular cortex compatible with new acute/subacute infarct, stable 8 mm acute/subacute infarct involving the right cerebral peduncle, stable restricted diffusion in the anterior frontal lobe and along the atrium of the right lateral ventricle consistent with treated tumor, improved diffusion changes within the genu of the corpus callosum suggesting these were ischemic, stable chronic encephalomalacia of the right frontal operculum, and stable periventricular T2 hyperintensities in the left hemisphere likely reflects the sequela of chronic microvascular ischemia. He was started on Eliquis , AEDs were adjusted and admitted with evaluation by neurology and Dr. Mark Sil with neuro-oncology, because of recent decline determined to be multifocal secondary to radiation necrosis, breakthrough seizures and multiple recent strokes.  Hospitalization complicated by dysphagia and GI determined to be neurologic.  Last known seizure 09/16/23, with subsequent increase nightly Lamictal .  Therapy evaluations completed due to patient decreased functional mobility was admitted for a comprehensive rehab program.  PRECAUTIONS: Fall  WEIGHT BEARING RESTRICTIONS: No  PAIN:  Are you having pain? No  FALLS: Has patient fallen in last 6 months? Yes. Number of falls 3 - trying to put pants on, ladder before stroke, carrying cat litter and lost balance on steps  LIVING ENVIRONMENT:  Home Living/Prior Functioning Home Living: Private residence Living Arrangements: Spouse - Alan Wise, Children - daughter Alan Wise, son 5yo Available Help at Discharge: Family, Available 24 hours/day Type of Home: House Home Access: Stairs to enter (stone steps) Entrance  Stairs-Number of Steps: 4 Entrance Stairs-Rails: Right, Left Home Layout: Two level, Able to live on main level with bedroom/bathroom (can live on main level) Alternate Level Stairs-Number of Steps: flight of steps to 2nd floor bedroom Alternate Level Stairs-Rails:  (L for first 5 and R on second 5) Bathroom Shower/Tub: Psychologist, counselling, Sport and exercise psychologist: Standard Bathroom Accessibility: Yes IADL History Homemaking Responsibilities: No Prior Function/Level of Independence: Independent with basic ADLs, Independent with transfers, Independent with gait, Independent with homemaking with ambulation Able to Take Stairs?: Yes Driving: No (not driving for several years due to seizures)  PLOF: Independent with basic ADLs and Independent with household mobility without device Prior interests: Wrestled, ball room dance, soccer, professor of mathematics at [employer]  PATIENT GOALS: Pt reported he needs to work on L side awareness, vision on left side, strength and putting jacket on.  OBJECTIVE:  Note: Objective measures were completed at Evaluation unless otherwise noted.  HAND DOMINANCE: Left  ADLs: Overall ADLs: Min assist to Mod Ind Transfers/ambulation related to ADLs: Mod Ind Eating: Pt switched to R hand for eating, wife helps cut food Grooming: Wife used clippers recently UB Dressing: Reports he struggles with button up shirt/jacket,  LB Dressing: Pt spouse report he fell with LB dressing in standing, wife confirms he can tie shoelaces Toileting: Ind Bathing: Mod I Tub Shower transfers: walk in shower with built in  bench with B showerheads Equipment: none  IADLs: Shopping: wife Light housekeeping: wife Meal Prep: wife Community mobility: Supervision Medication management: Wife assists Financial management: Wife Handwriting: Increased time - due to aphasia (June 2024 aphasia, speech, reading)  MOBILITY STATUS: Hx of falls  POSTURE COMMENTS:  rounded shoulders and forward  head Sitting balance: WFL  ACTIVITY TOLERANCE: Activity tolerance: Fair  FUNCTIONAL OUTCOME MEASURES: TBA  UPPER EXTREMITY ROM:    Active ROM Right eval Left eval  Shoulder flexion 120 90  Shoulder abduction    Shoulder adduction    Shoulder extension    Shoulder internal rotation    Shoulder external rotation    Elbow flexion WNL WFL  Elbow extension WNL WFL  Wrist flexion  Sight limits  Wrist extension    Wrist ulnar deviation    Wrist radial deviation    Wrist pronation    Wrist supination    (Blank rows = not tested)  UPPER EXTREMITY MMT:     MMT Right eval Left eval  Shoulder flexion 4 3-  Shoulder abduction    Shoulder adduction    Shoulder extension    Shoulder internal rotation    Shoulder external rotation    Middle trapezius    Lower trapezius    Elbow flexion 5 4+  Elbow extension 4+ 4  Wrist flexion    Wrist extension    Wrist ulnar deviation    Wrist radial deviation    Wrist pronation    Wrist supination    (Blank rows = not tested)  HAND FUNCTION: Grip strength: Right: 53.5, 57.5, 53.3  lbs; Left: 42.9, 51.8, 41.8  lbs Right: 54.8 lbs Left: 45.5 lbs  COORDINATION: 9 Hole Peg test: Right: 41.24  sec; Left: 1:04.00 sec  SENSATION: Patient initially reported normal but then noted he may have some limtations ie) does not know   EDEMA: NA  MUSCLE TONE: Some changes ie) not as smooth with movements and coordination  COGNITION: Overall cognitive status: Impaired/Aphasia impacts cognition Hospital: [facility] Summary Score: 12   VISION: Subjective report: January - vision was decreasing Baseline vision: Wears glasses for reading only about 1 year Visual history: changes were happening perhaps a precursor to CVA  MR review: Vision Baseline Vision/History: 1 Wears glasses Ability to See in Adequate Light: 1 Impaired Patient Visual Report: Peripheral vision impairment Vision Assessment?: Wears glasses for reading Eye Alignment: Within  Functional Limits Alignment/Gaze Preference: Within Defined Limits Visual Fields: Left homonymous hemianopsia Additional Comments: L peripheral vision decr since January  VISION ASSESSMENT: Tracking/Visual pursuits: Impaired - pt has trouble with vision on L side  Patient has difficulty with following activities due to following visual impairments: walks into walls, chairs etc, ie) doesn't always scan especially if he is tired, agitated, etc  10/22/23 -  R side peripheral vision: approx. 70* L side peripheral vision: visual field cut, unable to see marker until midline.  Pt reported difficulty seeing objects on L side.  PERCEPTION: Not tested  PRAXIS: Not tested  TREATMENT :   - Therapeutic activities completed for duration as noted below including: Reviewed and updated putty HEP with upgraded green putty today to progress strengthening (he has not met 5 lb improvement yet since eval), coordination and sensory stimulation of B UEs.  Patient provided visual demonstration, verbal and tactile cues as needed to improve performance of the various exercises/activities including: - Putty Squeezes and putty rolls - per previous instruction to prepare for additional/new activities - Pinch and Pull with Putty - this motion is combined with different pinches (3-Point Pinch, Tip Pinch, Key Pinch) - patient encouraged to combine tripod, pincer and/or key pinch with "pinch and pull" motion of putty pulling away from midline toward L while turning his head in that direction to find the pile of putty as moved by OT. - Finger Extension with Putty - pt shown how to work on task with all fingers and thumb as well as individual fingers in opposition to thumb - Finger Adduction with Putty - pt shown how to work on weaving putty between fingers/thumb and then squeeze fingers together while laying  hand flat on table top. Encouraged to use pink putty at home for this task and for Removing Objects from Putty  - per previous instruction also Patient benefited from extra time, verbal/tactile cues, and modeling of task to allow time for processing of verbal instructions and improve motor planning of unfamiliar movements as well as to encourage L side visual regard and scanning..  Pt engaged in dynamic Environmental Scanning activity. OT placed 10 numbers along hallway in front of patient (5 on each side and mostly around shoulder/head height) and had patient locate them as he walked down the hall for improved scanning and locating of items to promote visual neuro rehabilitation following CVA.  Pt located only 2/5 with each pass on his L side when having to scan from L to R and only found 3-4 numbers if he was only having to look to his L.  Cues given to locate remaining items on additional passes with improvement to 4/5 numbers.  Challenge added tapping a balloon while still visually locating numbers as well.  With dual task, pt missed 4 on his L side but was able to end session with locating 9/10 numbers with balloon tapping task. Education provided on how the visual scanning task simulates the multi-sensory aspects of driving etc.   PATIENT EDUCATION: Education details: Updated putty HEP Person educated: Patient and Mother Education method: Explanation, Demonstration, Tactile cues, Verbal cues, and Handouts Education comprehension: verbalized understanding, returned demonstration, verbal cues required, tactile cues required, and needs further education  HOME EXERCISE PROGRAM: 10/20/2023: scanning activities; vision strategies 10/22/2023: PUTTY Access Code: [email or web address]  11/18/2023: dowel HEP 11/23/23: updated putty HEP - same access code  GOALS: Goals reviewed with patient? Yes  SHORT TERM GOALS: Target date: 11/20/23  Patient will demonstrate initial UE HEP with 25% verbal cues or less for proper  execution (d/t B shoulder limitations and L sided weakness s/p CVA). Baseline: New to outpt OT Goal status: IN PROGRESS   2.  Patient will demonstrate at least 5+ lbs improvement for BUE grip strength as needed to open jars and other containers. Baseline: Right: 54.8 lbs Left: 45.5 lbs 11/20/2023: 58.6 lbs Left: 49.3 lbs Goal status: IN PROGRESS   3.  Patient will demo improved FM coordination as evidenced by completing nine-hole peg 5+ seconds faster with use of BUE.  Baseline: 9 Hole Peg test: Right: 41.24  sec; Left: 1:04.00  sec 11/20/2023: Right: 36 sec; Left: 47 sec Goal status: MET   4.  Patient will independently recall at least 2 compensatory strategies for visual impairment without cueing. Baseline: New to outpt OT - bumps into furniture Goal status: IN Progress 6/2 - still bumps things on L side   5. Pt will verbalize understanding of modifications, strategies and/or equipment PRN with visual cues as needed to increase safety and independence with ADLs and IADLs (I.e. with decreased fall risk). Baseline: New to outpt OT - fell donning pants Goal Status: MET - Pt reports indepcenence   LONG TERM GOALS: Target date: 12/18/23   Patient will demonstrate updated UE HEP with visual handouts only for proper execution.  Baseline: New to outpt OT  Goal status: IN Progress   2.  Patient will return demonstration of visual adaptation use and verbalize understanding of how to obtain item(s) if desired.  Baseline: New to outpt OT Goal status: IN Progress   3.  Patient will demo improved FM coordination as evidenced by completing nine-hole peg with use of BUE as close to 30 seconds as possible.  Baseline: 9 Hole Peg test: Right: 41.24  sec; Left: 1:04.00 sec Goal status: INITIAL 11/20/23: Right: 36 sec; Left: 47 sec   4.  Pt will write/print his name/initials. Baseline: difficult due to aphasia by report Goal status: INITIAL   5.  Pt will report increased ease with donning all  clothing items - shirt, socks/shoes without assistance of spouse Baseline: occasional assistance of spouse Goal status: MET  ASSESSMENT:  CLINICAL IMPRESSION: Patient is a 53 y.o. male who was seen today for occupational therapy treatment for UE weakness and visual deficits s/p CVA. Pt presents below baseline level of function with visual regard to L side demonstrating functional deficits and impairments that impact safety ie) bumps objects on L side and misses >50% of items placed there. Pt will benefit from continued skilled OT services in the outpatient setting to work on impairments as noted at evaluation to help pt return to [facility] as able.    PERFORMANCE DEFICITS: in functional skills including ADLs, IADLs, coordination, dexterity, proprioception, sensation, tone, ROM, strength, pain, fascial restrictions, muscle spasms, flexibility, Fine motor control, Gross motor control, mobility, balance, body mechanics, endurance, decreased knowledge of precautions, decreased knowledge of use of DME, vision, and UE functional use, cognitive skills including attention, emotional, energy/drive, learn, memory, perception, problem solving, safety awareness, sequencing, temperament/personality, thought, and understand, and psychosocial skills including coping strategies, environmental adaptation, habits, interpersonal interactions, and routines and behaviors.   IMPAIRMENTS: are limiting patient from ADLs, IADLs, rest and sleep, education, work, play, leisure, and social participation.   CO-MORBIDITIES: has co-morbidities such as h/o frontal oligodendroglioma  that affects occupational performance. Patient will benefit from skilled OT to address above impairments and improve overall function.  REHAB POTENTIAL: Good  PLAN:  OT FREQUENCY: 1-2x/week  OT DURATION: 8 weeks  PLANNED INTERVENTIONS: 97535 self care/ADL training, 95284 therapeutic exercise, 97530 therapeutic activity, 97112 neuromuscular  re-education, 97140 manual therapy, balance training, functional mobility training, visual/perceptual remediation/compensation, psychosocial skills training, energy conservation, coping strategies training, patient/family education, and DME and/or AE instructions  RECOMMENDED OTHER SERVICES: NA - other therapies already scheduled at this time  CONSULTED AND AGREED WITH PLAN OF CARE: Patient and family member/caregiver  PLAN FOR NEXT SESSION:  Daily/weekly picture schedule/cleaning caddies  Hallway balloon with sign recognition/response  Visual scanning activities - encourage L side awareness (balloon + scanning) Review theraputty HEP HEP update: lower-level coordination (  2-4 exercises) HEP schedule Visual scan to L with putty   Zora Hires, OT 11/23/2023, 6:10 PM

## 2023-11-23 NOTE — Therapy (Signed)
 OUTPATIENT PHYSICAL THERAPY NEURO TREATMENT    Patient Name: Alan Wise MRN: [id number] DOB:1970/07/24, 53 y.o., male Today's Date: 11/23/2023   PCP: Helayne Lo, NP REFERRING PROVIDER: Celia Coles, MD  END OF SESSION:  PT End of Session - 11/23/23 1053     Visit Number 8    Number of Visits 18    Date for PT Re-Evaluation 12/30/23    Authorization Type [employer]    PT Start Time 1100    PT Stop Time 1145    PT Time Calculation (min) 45 min    Equipment Utilized During Treatment Gait belt    Activity Tolerance Patient tolerated treatment well    Behavior During Therapy WFL for tasks assessed/performed;Agitated             Past Medical History:  Diagnosis Date   Allergy    Complication of anesthesia    pt reports he is starting to have more difficulty arousing after surgery   Diabetes mellitus (HCC)    GERD (gastroesophageal reflux disease)    Headache    History of kidney stones    Hyperlipidemia    Renal disorder    kidney stones   Past Surgical History:  Procedure Laterality Date   APPLICATION OF CRANIAL NAVIGATION Right 01/17/2021   Procedure: APPLICATION OF CRANIAL NAVIGATION;  Surgeon: Van Gelinas, MD;  Location: [facility] OR;  Service: Neurosurgery;  Laterality: Right;   COLONOSCOPY  09/03/1994   COLONOSCOPY WITH PROPOFOL  N/A 01/07/2023   Procedure: COLONOSCOPY WITH PROPOFOL ;  Surgeon: Marnee Sink, MD;  Location: [facility] ENDOSCOPY;  Service: Endoscopy;  Laterality: N/A;  NOT TOO EARLY   CYSTOSCOPY WITH STENT PLACEMENT Right 01/25/2016   Procedure: CYSTOSCOPY WITH STENT PLACEMENT;  Surgeon: Bart Born, MD;  Location: [facility] ORS;  Service: Urology;  Laterality: Right;   FRAMELESS  BIOPSY WITH BRAINLAB Right 01/17/2021   Procedure: RIGHT STEREOTACTIC BIOPSY OF INSULAR LESION;  Surgeon: Van Gelinas, MD;  Location: [facility] OR;  Service: Neurosurgery;  Laterality: Right;   KNEE ARTHROSCOPY W/ MENISCAL REPAIR Right 06/23/1988   POLYPECTOMY   01/07/2023   Procedure: POLYPECTOMY;  Surgeon: Marnee Sink, MD;  Location: [facility] ENDOSCOPY;  Service: Endoscopy;;   removal of birthmark  06/08/2013   SKIN CANCER EXCISION  06/23/2012   TYMPANOSTOMY TUBE PLACEMENT  08/06/1978   URETEROSCOPY WITH HOLMIUM LASER LITHOTRIPSY Right 01/25/2016   Procedure: URETEROSCOPY WITH HOLMIUM LASER LITHOTRIPSY;  Surgeon: Bart Born, MD;  Location: [facility] ORS;  Service: Urology;  Laterality: Right;   URETHRAL STRICTURE DILATATION     visual inspection of vocal cord     1973   WISDOM TOOTH EXTRACTION     Patient Active Problem List   Diagnosis Date Noted   Gait disorder 09/14/2023   Seizure disorder (HCC) 09/10/2023   History of CVA (cerebrovascular accident) 09/09/2023   Essential hypertension 09/09/2023   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 Pt reports no issues with walking on level ground or unlevel ground. Going up and down stairs are fine but he performs step to pattern. Picking things off the floor is mildly difficult.   Pt accompanied by: self and family member - mother [name]  PERTINENT HISTORY: past medical history of Allergy, Complication of anesthesia, Diabetes mellitus (HCC), GERD  (gastroesophageal reflux disease), Headache, History of kidney stones, Hyperlipidemia, and Renal disorder. 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  PAIN:  Are you having pain? No  PRECAUTIONS: Fall and Other: Seizures and L visual cut    PATIENT GOALS: Improve walking, balance                                                                                            TREATMENT DATE:   Reassessment performed today  Blaze pods: - Pt standing on foam while taps the blazepods with his hands. Focus on one color program: multicolor light up and pt to tap target color. 5 blaze pods. Trial 1: tap pink color: 30/30 hits, average reaction 2954 ms Trial 2: tap pink color: 30/30 hits, average reaction 2027 ms Trial 3: tap pink color: 30/30 hits, average reaction 1634 ms  - pt standing on floor with taping blaze pods with his feet. Focus on one color. Multi color light up and pt targets one color. 6 blaze pods - Trial 1: tap blue color: 30/30 hits, average reaction 1706 ms Trial 2: tap blue color: 30/30 hits, average reaction 1317 ms Trial 3: tap blue color: 30/30 hits, average reaction 1593 ms  Tandem walk on floor: fwd only, 6 x 10'   Pushing/rolling 1olb slam ball on floor with foot: alternating R and L for 115'     PATIENT EDUCATION: Education details: continue HEP, see above, PT recommending 24/7 supervision Person educated: Patient and Parent Education method: Explanation, Demonstration, and Handouts Education comprehension: verbalized understanding and needs further education  HOME EXERCISE PROGRAM: Access Code: [email or web address] URL: [email or web address] Date: 10/28/2023 Prepared by: Camella Cave  Exercises - Half Deadlift with Kettlebell  - 1 x daily - 4 x weekly - 3 sets - 10 reps - Sit to Stand Without Arm Support  - 1 x daily - 4 x weekly - 3 sets - 10 reps  GOALS: Goals reviewed with patient? Yes  SHORT TERM GOALS: Target date:  11/10/2023  Pt will demo >50% compliance with HEP to self manage symptoms. Baseline: TBD, reports doing HEP about 30% of the time Goal status: NOT MET   LONG TERM GOALS: Target date: 12/30/2023     Patient will demo 5x sit to stand score of <16 sec to improve overall functional strength. Baseline: 34 s without UE support; 19 seconds (11/18/23) Goal status: Progressing continue   2.  Pt will demo functional gait assessment score of >24/30 to improve overall balance with functional gait. Baseline: 20/30 (eval); 21/30 (11/18/23) Goal status: Progressing continue  3.  Pt will be able to perform reciprocal gait pattern when descending stairs with use of one rail Baseline: step to pattern when descending (leading with L  LE) with one rail (10/13/23)- same as eval (11/18/23) Goal status: Progressing continue  4.  Patient will demo gait speed of >0.90 m/s to improve functional mobility in community. Baseline: TBD, 1.3 m/s  Goal status: MET  5.  Pt will demo improved overall safety awareness and decision making to avoid risk for fall and report no falls during course of 4 weeks consecutively. Baseline: Pt and mother self reported of no falls or near falls. Goal status: Goal met   ASSESSMENT:  CLINICAL IMPRESSION: Emphasis on dynamic gait and balance with static balance and cognitive component with use of blaze pods. Pt demo improve reaction time with repetitive training with exception of last trial when tapping with his feet, he showed slight regression due to fatigue. Pt demo improved balance with tandem walking compared to previous session where he was able to complete 10 steps. OBJECTIVE IMPAIRMENTS: Abnormal gait, decreased activity tolerance, decreased balance, decreased endurance, decreased knowledge of condition, decreased mobility, difficulty walking, decreased strength, decreased safety awareness, impaired perceived functional ability, impaired tone, impaired UE functional use, and  impaired vision/preception.   ACTIVITY LIMITATIONS: carrying, lifting, bending, standing, squatting, stairs, transfers, and locomotion level  PARTICIPATION LIMITATIONS: meal prep, cleaning, laundry, driving, shopping, community activity, occupation, and yard work  PERSONAL FACTORS: Time since onset of injury/illness/exacerbation and 3+ comorbidities: brain tumor, CVA, seizures are also affecting patient's functional outcome.   REHAB POTENTIAL: Good  CLINICAL DECISION MAKING: Stable/uncomplicated  EVALUATION COMPLEXITY: Low  PLAN:  PT FREQUENCY: 1-2x/week  PT DURATION: 6 weeks  PLANNED INTERVENTIONS: 97164- PT Re-evaluation, 97750- Physical Performance Testing, 97110-Therapeutic exercises, 97530- Therapeutic activity, 97112- Neuromuscular re-education, 97535- Self Care, 16109- Manual therapy, [phone]- Gait training, Patient/Family education, Balance training, Stair training, Joint mobilization, Visual/preceptual remediation/compensation, DME instructions, Cryotherapy, and Moist heat  PLAN FOR NEXT SESSION: Continue with skilled PT   Kristine Phalen, PT 11/23/2023, 10:54 AM

## 2023-11-23 NOTE — Patient Instructions (Signed)
 Access Code: [email or web address] URL: [email or web address] Date: 11/23/2023 Prepared by: Sudie Ely  Exercises - Putty Squeezes  - 1-2 x daily - 10 reps - Rolling Putty on Table  - 1-2 x daily - 10 reps - Finger Pinch and Pull with Putty  - 1-2 x daily - 10 reps - 3-Point Pinch with Putty  - 1-2 x daily - 10 reps - Tip PUSH with Putty  - 1-2 x daily - 10 reps - Key Pinch with Putty  - 1-2 x daily - 10 reps - Finger Extension with Putty  - 1-2 x daily - 10 reps - Finger Adduction with Putty  - 1-2 x daily - 10 reps - Removing Marbles from Putty  - 1-2 x daily - 10 reps

## 2023-11-25 ENCOUNTER — Ambulatory Visit: Admitting: Occupational Therapy

## 2023-11-25 ENCOUNTER — Ambulatory Visit: Admitting: Speech Pathology

## 2023-11-25 ENCOUNTER — Ambulatory Visit

## 2023-11-25 ENCOUNTER — Encounter: Payer: Self-pay | Admitting: Speech Pathology

## 2023-11-25 DIAGNOSIS — R2689 Other abnormalities of gait and mobility: Secondary | ICD-10-CM

## 2023-11-25 DIAGNOSIS — R4701 Aphasia: Secondary | ICD-10-CM

## 2023-11-25 DIAGNOSIS — M6281 Muscle weakness (generalized): Secondary | ICD-10-CM

## 2023-11-25 DIAGNOSIS — R208 Other disturbances of skin sensation: Secondary | ICD-10-CM

## 2023-11-25 DIAGNOSIS — R29818 Other symptoms and signs involving the nervous system: Secondary | ICD-10-CM

## 2023-11-25 DIAGNOSIS — R278 Other lack of coordination: Secondary | ICD-10-CM

## 2023-11-25 DIAGNOSIS — I6300 Cerebral infarction due to thrombosis of unspecified precerebral artery: Secondary | ICD-10-CM

## 2023-11-25 DIAGNOSIS — R41842 Visuospatial deficit: Secondary | ICD-10-CM

## 2023-11-25 NOTE — Therapy (Signed)
 OUTPATIENT OCCUPATIONAL THERAPY NEURO TREATMENT  Patient Name: Alan Wise MRN: [id number] DOB:02/09/1971, 53 y.o., male Today's Date: 11/25/2023  PCP: Carollynn Cirri, NP REFERRING PROVIDER: Celia Coles, MD  END OF SESSION:  OT End of Session - 11/25/23 1021     Visit Number 11    Number of Visits 16    Date for OT Re-Evaluation 12/18/23    Authorization Type [employer] 2025 OOPM MET    Authorization Time Period Covered 100% VL:MN Auth Not Reqd    OT Start Time 1020    OT Stop Time 1100    OT Time Calculation (min) 40 min    Equipment Utilized During Treatment puzzle, pen/paper    Activity Tolerance Patient tolerated treatment well    Behavior During Therapy WFL for tasks assessed/performed            Past Medical History:  Diagnosis Date   Allergy    Complication of anesthesia    pt reports he is starting to have more difficulty arousing after surgery   Diabetes mellitus (HCC)    GERD (gastroesophageal reflux disease)    Headache    History of kidney stones    Hyperlipidemia    Renal disorder    kidney stones   Past Surgical History:  Procedure Laterality Date   APPLICATION OF CRANIAL NAVIGATION Right 01/17/2021   Procedure: APPLICATION OF CRANIAL NAVIGATION;  Surgeon: Van Gelinas, MD;  Location: [facility] OR;  Service: Neurosurgery;  Laterality: Right;   COLONOSCOPY  09/03/1994   COLONOSCOPY WITH PROPOFOL  N/A 01/07/2023   Procedure: COLONOSCOPY WITH PROPOFOL ;  Surgeon: Marnee Sink, MD;  Location: [facility] ENDOSCOPY;  Service: Endoscopy;  Laterality: N/A;  NOT TOO EARLY   CYSTOSCOPY WITH STENT PLACEMENT Right 01/25/2016   Procedure: CYSTOSCOPY WITH STENT PLACEMENT;  Surgeon: Bart Born, MD;  Location: [facility] ORS;  Service: Urology;  Laterality: Right;   FRAMELESS  BIOPSY WITH BRAINLAB Right 01/17/2021   Procedure: RIGHT STEREOTACTIC BIOPSY OF INSULAR LESION;  Surgeon: Van Gelinas, MD;  Location: [facility] OR;  Service: Neurosurgery;  Laterality: Right;    KNEE ARTHROSCOPY W/ MENISCAL REPAIR Right 06/23/1988   POLYPECTOMY  01/07/2023   Procedure: POLYPECTOMY;  Surgeon: Marnee Sink, MD;  Location: [facility] ENDOSCOPY;  Service: Endoscopy;;   removal of birthmark  06/08/2013   SKIN CANCER EXCISION  06/23/2012   TYMPANOSTOMY TUBE PLACEMENT  08/06/1978   URETEROSCOPY WITH HOLMIUM LASER LITHOTRIPSY Right 01/25/2016   Procedure: URETEROSCOPY WITH HOLMIUM LASER LITHOTRIPSY;  Surgeon: Bart Born, MD;  Location: [facility] ORS;  Service: Urology;  Laterality: Right;   URETHRAL STRICTURE DILATATION     visual inspection of vocal cord     1973   WISDOM TOOTH EXTRACTION     Patient Active Problem List   Diagnosis Date Noted   Gait disorder 09/14/2023   Seizure disorder (HCC) 09/10/2023   History of CVA (cerebrovascular accident) 09/09/2023   Essential hypertension 09/09/2023   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   Type 2 diabetes mellitus with hyperglycemia (HCC) 06/12/2014   Hyperlipidemia associated with type 2 diabetes mellitus (HCC) 06/12/2014    ONSET DATE: Referral: 09/23/2023  Hospitalization 3/30 - 09/25/23  REFERRING DIAG: I63.9 (ICD-10-CM) - Cerebral infarction, unspecified R frontal oligodendroglioma  THERAPY DIAG:  Other lack of coordination  Visuospatial deficit  Muscle weakness (generalized)  Other disturbances of skin sensation  Other symptoms and signs involving the nervous system  Rationale for Evaluation and Treatment: Rehabilitation  SUBJECTIVE:   SUBJECTIVE STATEMENT:  No new reports  Pt accompanied by: self and mother  PERTINENT HISTORY:  PMHx: Diabetes mellitus, GERD, headache, kidney stones, hyperlipidemia and renal disorder as well as 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  Presented to the hospital 09/14/2023 with worsening gait left-sided weakness/aphasia and new onset dysphagia.  Of note, he was recently hospitalized from 3-19 to 3-21 with  new CVA and breakthrough seizures.  In the ED he was started on IV fluids and MRI of the brain showed new punctate focus of restricted diffusion in the anterior insular cortex compatible with new acute/subacute infarct, stable 8 mm acute/subacute infarct involving the right cerebral peduncle, stable restricted diffusion in the anterior frontal lobe and along the atrium of the right lateral ventricle consistent with treated tumor, improved diffusion changes within the genu of the corpus callosum suggesting these were ischemic, stable chronic encephalomalacia of the right frontal operculum, and stable periventricular T2 hyperintensities in the left hemisphere likely reflects the sequela of chronic microvascular ischemia. He was started on Eliquis , AEDs were adjusted and admitted with evaluation by neurology and Dr. Mark Sil with neuro-oncology, because of recent decline determined to be multifocal secondary to radiation necrosis, breakthrough seizures and multiple recent strokes.  Hospitalization complicated by dysphagia and GI determined to be neurologic.  Last known seizure 09/16/23, with subsequent increase nightly Lamictal .  Therapy evaluations completed due to patient decreased functional mobility was admitted for a comprehensive rehab program.  PRECAUTIONS: Fall  WEIGHT BEARING RESTRICTIONS: No  PAIN:  Are you having pain? No  FALLS: Has patient fallen in last 6 months? Yes. Number of falls 3 - trying to put pants on, ladder before stroke, carrying cat litter and lost balance on steps  LIVING ENVIRONMENT:  Home Living/Prior Functioning Home Living: Private residence Living Arrangements: Spouse - [name], Children - daughter [name], son 5yo Available Help at Discharge: Family, Available 24 hours/day Type of Home: House Home Access: Stairs to enter (stone steps) Entrance Stairs-Number of Steps: 4 Entrance Stairs-Rails: Right, Left Home Layout: Two level, Able to live on main level with  bedroom/bathroom (can live on main level) Alternate Level Stairs-Number of Steps: flight of steps to 2nd floor bedroom Alternate Level Stairs-Rails:  (L for first 5 and R on second 5) Bathroom Shower/Tub: Psychologist, counselling, Sport and exercise psychologist: Standard Bathroom Accessibility: Yes IADL History Homemaking Responsibilities: No Prior Function/Level of Independence: Independent with basic ADLs, Independent with transfers, Independent with gait, Independent with homemaking with ambulation Able to Take Stairs?: Yes Driving: No (not driving for several years due to seizures)  PLOF: Independent with basic ADLs and Independent with household mobility without device Prior interests: Wrestled, ball room dance, soccer, professor of mathematics at [employer]  PATIENT GOALS: Pt reported he needs to work on L side awareness, vision on left side, strength and putting jacket on.  OBJECTIVE:  Note: Objective measures were completed at Evaluation unless otherwise noted.  HAND DOMINANCE: Left  ADLs: Overall ADLs: Min assist to Mod Ind Transfers/ambulation related to ADLs: Mod Ind Eating: Pt switched to R hand for eating, wife helps cut food Grooming: Wife used clippers recently UB Dressing: Reports he struggles with button up shirt/jacket,  LB Dressing: Pt spouse report he fell with LB dressing in standing, wife confirms he can tie shoelaces Toileting: Ind Bathing: Mod I Tub Shower transfers: walk in shower with built in bench with B showerheads Equipment: none  IADLs: Shopping: wife Light housekeeping: wife Meal Prep: wife Community mobility: Supervision Medication management: Wife  assists Financial management: Wife Handwriting: Increased time - due to aphasia (June 2024 aphasia, speech, reading)  MOBILITY STATUS: Hx of falls  POSTURE COMMENTS:  rounded shoulders and forward head Sitting balance: WFL  ACTIVITY TOLERANCE: Activity tolerance: Fair  FUNCTIONAL OUTCOME MEASURES: TBA  UPPER  EXTREMITY ROM:    Active ROM Right eval Left eval  Shoulder flexion 120 90  Shoulder abduction    Shoulder adduction    Shoulder extension    Shoulder internal rotation    Shoulder external rotation    Elbow flexion WNL WFL  Elbow extension WNL WFL  Wrist flexion  Sight limits  Wrist extension    Wrist ulnar deviation    Wrist radial deviation    Wrist pronation    Wrist supination    (Blank rows = not tested)  UPPER EXTREMITY MMT:     MMT Right eval Left eval  Shoulder flexion 4 3-  Shoulder abduction    Shoulder adduction    Shoulder extension    Shoulder internal rotation    Shoulder external rotation    Middle trapezius    Lower trapezius    Elbow flexion 5 4+  Elbow extension 4+ 4  Wrist flexion    Wrist extension    Wrist ulnar deviation    Wrist radial deviation    Wrist pronation    Wrist supination    (Blank rows = not tested)  HAND FUNCTION: Grip strength: Right: 53.5, 57.5, 53.3  lbs; Left: 42.9, 51.8, 41.8  lbs Right: 54.8 lbs Left: 45.5 lbs  COORDINATION: 9 Hole Peg test: Right: 41.24  sec; Left: 1:04.00 sec  SENSATION: Patient initially reported normal but then noted he may have some limtations ie) does not know   EDEMA: NA  MUSCLE TONE: Some changes ie) not as smooth with movements and coordination  COGNITION: Overall cognitive status: Impaired/Aphasia impacts cognition Hospital: [facility] Summary Score: 12   VISION: Subjective report: January - vision was decreasing Baseline vision: Wears glasses for reading only about 1 year Visual history: changes were happening perhaps a precursor to CVA  MR review: Vision Baseline Vision/History: 1 Wears glasses Ability to See in Adequate Light: 1 Impaired Patient Visual Report: Peripheral vision impairment Vision Assessment?: Wears glasses for reading Eye Alignment: Within Functional Limits Alignment/Gaze Preference: Within Defined Limits Visual Fields: Left homonymous hemianopsia Additional  Comments: L peripheral vision decr since January  VISION ASSESSMENT: Tracking/Visual pursuits: Impaired - pt has trouble with vision on L side  Patient has difficulty with following activities due to following visual impairments: walks into walls, chairs etc, ie) doesn't always scan especially if he is tired, agitated, etc  10/22/23 -  R side peripheral vision: approx. 70* L side peripheral vision: visual field cut, unable to see marker until midline.  Pt reported difficulty seeing objects on L side.  PERCEPTION: Not tested  PRAXIS: Not tested  TODAY'S TREATMENT :    - Therapeutic activities completed for duration as noted below including: Pt engaged in writing activities and able to write his initials and used printing for remaining activities.  He could print a few words form verbal instruction only ie) start a grocery list and even printed 4/4 words and 3/5 words as recited to him but he lost his place and mixed up the sentence with his grocery list on the same page.  He made a good effort to fill out a check as well. OT educated patient on general vision recommendations including increased contrast on his L side to promote scanning to all the way to the L for reading.  Pt is aware of compensatory strategy including turning head to left to compensate for field cut as well as use of scanning to improve safety with functional mobility and accurate location of items to promote visual neuro rehabilitation following CVA.   Pt engaged in using a puzzle to facilitate UE coordination and visual scanning ie) flipping and locating puzzle pieces based on color and shape ie) edge pieces with cues to scan to his L side.  Max cues given to consider this activity with his children for the to help him with his therapy.  Pt engaged in dynamic Environmental Scanning activity. OT placed 10  numbers and 10 letters along hallway in front of patient (5 of each on each side and at varied heights today) and had patient locate them as he walked down the hall for improved scanning and locating of items to promote visual neuro rehabilitation following CVA.  Pt located only 1-3/5 of each on his L side ie) encouraged to locate numbers only or letters only.  Challenge added with tossing a scarf while still visually locating cards as well.  With dual task, pt often had to stop UE movements to see read cards. Education continued re: how the visual scanning task simulates the multi-sensory aspects of driving etc.   PATIENT EDUCATION: Education details: Visual Comp strategies Person educated: Patient and Mother Education method: Explanation, Demonstration, Tactile cues, and Verbal cues Education comprehension: verbalized understanding, returned demonstration, verbal cues required, tactile cues required, and needs further education  HOME EXERCISE PROGRAM: 10/20/2023: scanning activities; vision strategies 10/22/2023: PUTTY Access Code: [email or web address]  11/18/2023: dowel HEP 11/23/23: updated putty HEP - same access code  GOALS: Goals reviewed with patient? Yes  SHORT TERM GOALS: Target date: 11/20/23  Patient will demonstrate initial UE HEP with 25% verbal cues or less for proper execution (d/t B shoulder limitations and L sided weakness s/p CVA). Baseline: New to outpt OT Goal status: MET   2.  Patient will demonstrate at least 5+ lbs improvement for BUE grip strength as needed to open jars and other containers. Baseline: Right: 54.8 lbs Left: 45.5 lbs 11/20/2023: 58.6 lbs Left: 49.3 lbs Goal status: IN PROGRESS   3.  Patient will demo improved FM coordination as evidenced by completing nine-hole peg 5+ seconds faster with use of BUE.  Baseline: 9 Hole Peg test: Right: 41.24  sec; Left: 1:04.00 sec 11/20/2023: Right: 36 sec; Left: 47 sec Goal status: MET   4.  Patient will independently recall at  least 2 compensatory strategies for visual impairment without cueing. Baseline: New to outpt OT - bumps into furniture Goal status: IN Progress 6/2 - still bumps things on L side   5. Pt will verbalize understanding of modifications, strategies and/or equipment PRN with visual cues as needed to increase safety and independence  with ADLs and IADLs (I.e. with decreased fall risk). Baseline: New to outpt OT - fell donning pants Goal Status: MET - Pt reports indepcenence   LONG TERM GOALS: Target date: 12/18/23   Patient will demonstrate updated UE HEP with visual handouts only for proper execution.  Baseline: New to outpt OT  Goal status: IN Progress   2.  Patient will return demonstration of visual adaptation use and verbalize understanding of how to obtain item(s) if desired.  Baseline: New to outpt OT Goal status: IN Progress   3.  Patient will demo improved FM coordination as evidenced by completing nine-hole peg with use of BUE as close to 30 seconds as possible.  Baseline: 9 Hole Peg test: Right: 41.24  sec; Left: 1:04.00 sec Goal status: INITIAL 11/20/23: Right: 36 sec; Left: 47 sec   4.  Pt will write/print his name/initials. Baseline: difficult due to aphasia by report Goal status: MET   5.  Pt will report increased ease with donning all clothing items - shirt, socks/shoes without assistance of spouse Baseline: occasional assistance of spouse Goal status: MET  ASSESSMENT:  CLINICAL IMPRESSION: Patient is a 53 y.o. male who was seen today for occupational therapy treatment for UE weakness and visual deficits s/p CVA. Pt presents below baseline level of function with visual regard to L side demonstrating functional deficits and impairments that impact safety ie) bumps objects on L side and misses >50% of items placed there. He demonstrates improved printing but is still frustrated with himself with writing.  Pt will benefit from continued skilled OT services in the outpatient  setting to work on impairments as noted at evaluation to help pt return to [facility] as able.    PERFORMANCE DEFICITS: in functional skills including ADLs, IADLs, coordination, dexterity, proprioception, sensation, tone, ROM, strength, pain, fascial restrictions, muscle spasms, flexibility, Fine motor control, Gross motor control, mobility, balance, body mechanics, endurance, decreased knowledge of precautions, decreased knowledge of use of DME, vision, and UE functional use, cognitive skills including attention, emotional, energy/drive, learn, memory, perception, problem solving, safety awareness, sequencing, temperament/personality, thought, and understand, and psychosocial skills including coping strategies, environmental adaptation, habits, interpersonal interactions, and routines and behaviors.   IMPAIRMENTS: are limiting patient from ADLs, IADLs, rest and sleep, education, work, play, leisure, and social participation.   CO-MORBIDITIES: has co-morbidities such as h/o frontal oligodendroglioma  that affects occupational performance. Patient will benefit from skilled OT to address above impairments and improve overall function.  REHAB POTENTIAL: Good  PLAN:  OT FREQUENCY: 1-2x/week  OT DURATION: 8 weeks  PLANNED INTERVENTIONS: 97535 self care/ADL training, 16109 therapeutic exercise, 97530 therapeutic activity, 97112 neuromuscular re-education, 97140 manual therapy, balance training, functional mobility training, visual/perceptual remediation/compensation, psychosocial skills training, energy conservation, coping strategies training, patient/family education, and DME and/or AE instructions  RECOMMENDED OTHER SERVICES: NA - other therapies already scheduled at this time  CONSULTED AND AGREED WITH PLAN OF CARE: Patient and family member/caregiver  PLAN FOR NEXT SESSION:  Daily/weekly picture schedule/cleaning caddies  Hallway balloon/scarf with sign recognition/response  Visual scanning  activities - encourage L side awareness (balloon + scanning) Review theraputty HEP HEP update: lower-level coordination (2-4 exercises) HEP schedule Visual scan to L with putty   Zora Hires, OT 11/25/2023, 5:00 PM

## 2023-11-25 NOTE — Therapy (Signed)
 OUTPATIENT SPEECH LANGUAGE PATHOLOGY APHASIA TREATMENT   Patient Name: Alan Wise MRN: [id number] DOB:[birth year], 53 y.o., male Today's Date: 11/25/2023  PCP: Carollynn Cirri, NP REFERRING PROVIDER: Celia Coles, MD  END OF SESSION:  End of Session - 11/25/23 1106     Visit Number 7    Number of Visits 17    Date for SLP Re-Evaluation 12/16/23    Authorization Type [employer]    SLP Start Time 1105    SLP Stop Time  1145    SLP Time Calculation (min) 40 min    Activity Tolerance Patient tolerated treatment well             Past Medical History:  Diagnosis Date   Allergy    Complication of anesthesia    pt reports he is starting to have more difficulty arousing after surgery   Diabetes mellitus (HCC)    GERD (gastroesophageal reflux disease)    Headache    History of kidney stones    Hyperlipidemia    Renal disorder    kidney stones   Past Surgical History:  Procedure Laterality Date   APPLICATION OF CRANIAL NAVIGATION Right 01/17/2021   Procedure: APPLICATION OF CRANIAL NAVIGATION;  Surgeon: Van Gelinas, MD;  Location: [facility] OR;  Service: Neurosurgery;  Laterality: Right;   COLONOSCOPY  09/03/1994   COLONOSCOPY WITH PROPOFOL  N/A 01/07/2023   Procedure: COLONOSCOPY WITH PROPOFOL ;  Surgeon: Marnee Sink, MD;  Location: [facility] ENDOSCOPY;  Service: Endoscopy;  Laterality: N/A;  NOT TOO EARLY   CYSTOSCOPY WITH STENT PLACEMENT Right 01/25/2016   Procedure: CYSTOSCOPY WITH STENT PLACEMENT;  Surgeon: Bart Born, MD;  Location: [facility] ORS;  Service: Urology;  Laterality: Right;   FRAMELESS  BIOPSY WITH BRAINLAB Right 01/17/2021   Procedure: RIGHT STEREOTACTIC BIOPSY OF INSULAR LESION;  Surgeon: Van Gelinas, MD;  Location: [facility] OR;  Service: Neurosurgery;  Laterality: Right;   KNEE ARTHROSCOPY W/ MENISCAL REPAIR Right 06/23/1988   POLYPECTOMY  01/07/2023   Procedure: POLYPECTOMY;  Surgeon: Marnee Sink, MD;  Location: [facility] ENDOSCOPY;  Service:  Endoscopy;;   removal of birthmark  06/08/2013   SKIN CANCER EXCISION  06/23/2012   TYMPANOSTOMY TUBE PLACEMENT  08/06/1978   URETEROSCOPY WITH HOLMIUM LASER LITHOTRIPSY Right 01/25/2016   Procedure: URETEROSCOPY WITH HOLMIUM LASER LITHOTRIPSY;  Surgeon: Bart Born, MD;  Location: [facility] ORS;  Service: Urology;  Laterality: Right;   URETHRAL STRICTURE DILATATION     visual inspection of vocal cord     1973   WISDOM TOOTH EXTRACTION     Patient Active Problem List   Diagnosis Date Noted   Gait disorder 09/14/2023   Seizure disorder (HCC) 09/10/2023   History of CVA (cerebrovascular accident) 09/09/2023   Essential hypertension 09/09/2023   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   PERTINENT HISTORY: Pt is a  53 y.o. year old male  who  has a past medical history of Allergy, Complication of anesthesia, Diabetes mellitus (HCC), GERD (gastroesophageal reflux disease), Headache, History of kidney stones, Hyperlipidemia, and Renal disorder.  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  They presented to the hospital 09/14/23 with worsening gait, left-sided weakness, and new onset dysphagia.  Of note, he was recently hospitalized at home announce from 3-19 to 3-21 with new CVA and breakthrough seizures.  In the ER, he was started on IV fluids and MRI brain showed new punctate focus of restricted diffusion in the anterior insular cortex compatible with a new  acute/subacute infarct, stable 8 mm acute/subacute infarct involving the right cerebral peduncle, stable restricted diffusion in the anterior right frontal lobe and along the atrium of the right lateral ventricle consistent with treated tumor. Known to use from Speech Evaluation prior to CVA 09/09/23, however no therapy started due to hospitalization 09/09/23.   PAIN:  Are you having pain? No                                                                                                                            TREATMENT DATE:   11/25/23: He has not brought in his communication book - requested again that hey bring in the book or at least the pages. Targeted word finding and multimodal communication - in category of yard work, he generated 6 words with gesture and 1st letter cue - with instruction to draw, he successfully drew a bed and I was able to give choice of flower or vegetable bed to verbalize he was meaning a vegetable bed. In written expression at word level with categories of movies - he successfully wrote and named 2 movies with mod I. With semantic cues and gesture cues he named/wrote another movie. With extended time and occasional min questioning cues to communicated that his daughter prefers theater/broadway "it's higher (than a movie) you sit and they come on stage." Continue to encourage multimodal communication.   11/20/23: Introduced alphabet board for use of self-cueing for anomia, as pt previously showed benefit from first level cue. 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 ABC board effective in x3 opportunities, SLP provision of first letter effective in x6 opportunities, x2 instances of using ABC tiles to generate target word (population,  algebra). Pt demonstrating good responsiveness to letter cueing. Occasional benefit from phonemic cues, as needed. Reduced benefit from semantic cues this date.   11/06/23: Reviewed purpose of communication book, target employment for discussion of every day subjects. Using written aid, pt does successfully communication response to SLP questions in 4/5 trials though evidencing vague language, empty speech. Target generative naming with personally relevant topics. 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 X1 attempts of pt providing description of anomic target. Demonstration of phonemic cueing provided to  aid in anomia recovery. Education provided to Cisne regarding benefit of receiving cues from communication partners to aid in recovery.   11/04/23: They left communication book at home - will bring it next session. Targeted simple sentence generation with fill in the blank verb (s-o-v) in personally relevant sentences. Jacquees required frequent written choice of 2 to complete 8/10 sentences. Targeted divergent naming in personally relevant categories (baking, items in his garage, his degrees and schools) - He required visual organization (topic, then numbers) and frequent 1st letter cues, choice of 2 to name 5-7 items in each category.   10/28/23: Targeted naming with Star Wars words - he generated 15 with frequent mod semantic and 1st letter cues. Targeted written expression writing the words with frequent mod A for error awareness and self correction. Targeted verbal compensations for aphasia generating 3-4 descriptions of 2 fast food restaurants ([employer] and [employer]) with frequent mod to max verbal, visual cues (looking at the menu) to generate descriptions as well as his orders - consistent extended time;  10/26/23: Harace enters with communication notebook he and his wife have  generated. He is requesting he words be moved to the right of the page rather than centered. 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 Targeted verbal compensations for aphasia generated 3-4 descriptions of family members. With visual organizer of Name with 1-4 numbered below to support attention to task and with usual mod questioning cues and verbal cues, he generated 4 descriptions of his daughter. Targeted divergent naming in personally relevant category (ballroom dancing) with extended time and occasional min 1st letter or semantic cue, Rowan named 10 ballroom dances.   10/21/23: Evaluation completed - see above. [name] brought communication notebook we started to the hospital however it was lost. Reviewed what to include in communication support book - she agrees to re-print the pages, adding restaurant orders, bullet points about friends and family, places they frequent. Cued them to think about people, places, items the encounter daily, weekly and monthly - generated ideas and words for book - See patient instructions. Generated home practice of naming items around the home, children's activities, school subjects they study, upcoming activities. Instructed them to also practice the words in his communication support book as these are the most personally relevant. In  divergent naming task (school subjects) Jarris benefits from Topic and numbered visual cues to remain on task and avoid tangential speech/language. Spouse endorses they have a white board at home and will use this strategy during naming tasks at home.     PATIENT EDUCATION: Education details: See Treatment; See Patient Instructions, compensations for aphasia, communication supports, language  activities to do at home Person educated: Patient and Spouse Education method: Explanation, Demonstration, Verbal cues, and Handouts Education comprehension: verbalized understanding, returned demonstration, verbal cues required, and needs further education   GOALS: Goals reviewed with patient? Yes - 11/18/23 - target date Pt will use multimodal communication to communicate family names and biographical information with rare min A Baseline: Goal status: ONGOING   2.  Pt and spouse will generate 4 personally relevant places and orders to add to communication support book with occasional min A Baseline:  Goal status: ONGOING   3.  Pt will use multimodal communication to  name 8 items in a personally relevant category with occasional min A Baseline:  Goal status: ONGOING   4.  Pt will use external aids to attend to the left when reading sentences  Baseline:  Goal status: ONGOING   5.  Pt will use verbal compensations for aphasia in structured tasks with occasional min A Baseline:  Goal status: ONGOING       LONG TERM GOALS: Target date: 12/16/23   Pt will use communication support notebook and scripting to place order at restaurant and to make 2 business calls Baseline:  Goal status: ONGOING   2.  Pt will use multimodal communication to communicate preferences in simple conversation Baseline:  Goal status: ONGOING   3.  Pt and spouse will carryover 3 compensatory strategies to support pt's auditory comprehension Baseline:  Goal status: ONGOING   4.  Pt will generate moderately complex sentences in structured task such as VNeST with usual min A Baseline:  Goal status:ONGOING   5.  Pt and spouse will carryover 1-2 language enhancing activities at home daily 5/7 days Baseline:  Goal status: ONGOING   6.  Pt and spouse will add 6 new topics/words to communication support notebook Baseline:  Goal status: ONGOING   ASSESSMENT:  CLINICAL IMPRESSION: Patient is a 53 y.o.  male who was seen today for moderate aphasia c/b empty, vague language, halting for word finding difficulties. Auditory comprehension impairments noted in conversation gathering history. Written expression impaired at phrase level, spouse endorses texts often don't make sense. "He texts like he speaks". Due to aphasia, Tsuneo plans to retire at the end of this semester. He had difficulty stating spouse and children's names and his address. He endorses significant frustration.  They do endorse some attempts at multimodal communication with inconsistent success. After prior evaluation, they generated communication support book, however this was lost.  I recommend skilled ST to maximize communication for safety, to reduce caregiver burden and improve QOL.   OBJECTIVE IMPAIRMENTS: include aphasia. These impairments are limiting patient from return to work, managing medications, managing appointments, managing finances, ADLs/IADLs, and effectively communicating at home and in community. Factors affecting potential to achieve goals and functional outcome are co-morbidities and severity of impairments. Patient will benefit from skilled SLP services to address above impairments and improve overall function.  REHAB POTENTIAL: Good  PLAN:  SLP FREQUENCY: 2x/week  SLP DURATION: 8 weeks  PLANNED INTERVENTIONS: Language facilitation, Environmental controls, Cueing hierachy, Cognitive reorganization, Internal/external aids, Functional tasks, Multimodal communication approach, SLP instruction and feedback, Compensatory strategies, Patient/family education, [phone] Treatment of speech (30 or 45 min) , and 40981- Speech Eval Sound Prod, Artic, Phon, Eval Compre, Express    Analie Katzman, Dareen Ebbing, CCC-SLP 11/25/2023, 11:52 AM

## 2023-11-25 NOTE — Therapy (Signed)
 OUTPATIENT PHYSICAL THERAPY NEURO TREATMENT    Patient Name: Alan Wise MRN: [id number] DOB:Apr 18, 1971, 52 y.o., male Today's Date: 11/25/2023   PCP: Helayne Lo, NP REFERRING PROVIDER: Celia Coles, MD  END OF SESSION:  PT End of Session - 11/25/23 0930     Visit Number 9    Number of Visits 18    Date for PT Re-Evaluation 12/30/23    Authorization Type [employer]    PT Start Time 0930    PT Stop Time 1015    PT Time Calculation (min) 45 min    Equipment Utilized During Treatment Gait belt    Activity Tolerance Patient tolerated treatment well    Behavior During Therapy WFL for tasks assessed/performed;Agitated             Past Medical History:  Diagnosis Date   Allergy    Complication of anesthesia    pt reports he is starting to have more difficulty arousing after surgery   Diabetes mellitus (HCC)    GERD (gastroesophageal reflux disease)    Headache    History of kidney stones    Hyperlipidemia    Renal disorder    kidney stones   Past Surgical History:  Procedure Laterality Date   APPLICATION OF CRANIAL NAVIGATION Right 01/17/2021   Procedure: APPLICATION OF CRANIAL NAVIGATION;  Surgeon: Van Gelinas, MD;  Location: [facility] OR;  Service: Neurosurgery;  Laterality: Right;   COLONOSCOPY  09/03/1994   COLONOSCOPY WITH PROPOFOL  N/A 01/07/2023   Procedure: COLONOSCOPY WITH PROPOFOL ;  Surgeon: Marnee Sink, MD;  Location: [facility] ENDOSCOPY;  Service: Endoscopy;  Laterality: N/A;  NOT TOO EARLY   CYSTOSCOPY WITH STENT PLACEMENT Right 01/25/2016   Procedure: CYSTOSCOPY WITH STENT PLACEMENT;  Surgeon: Bart Born, MD;  Location: [facility] ORS;  Service: Urology;  Laterality: Right;   FRAMELESS  BIOPSY WITH BRAINLAB Right 01/17/2021   Procedure: RIGHT STEREOTACTIC BIOPSY OF INSULAR LESION;  Surgeon: Van Gelinas, MD;  Location: [facility] OR;  Service: Neurosurgery;  Laterality: Right;   KNEE ARTHROSCOPY W/ MENISCAL REPAIR Right 06/23/1988   POLYPECTOMY   01/07/2023   Procedure: POLYPECTOMY;  Surgeon: Marnee Sink, MD;  Location: [facility] ENDOSCOPY;  Service: Endoscopy;;   removal of birthmark  06/08/2013   SKIN CANCER EXCISION  06/23/2012   TYMPANOSTOMY TUBE PLACEMENT  08/06/1978   URETEROSCOPY WITH HOLMIUM LASER LITHOTRIPSY Right 01/25/2016   Procedure: URETEROSCOPY WITH HOLMIUM LASER LITHOTRIPSY;  Surgeon: Bart Born, MD;  Location: [facility] ORS;  Service: Urology;  Laterality: Right;   URETHRAL STRICTURE DILATATION     visual inspection of vocal cord     1973   WISDOM TOOTH EXTRACTION     Patient Active Problem List   Diagnosis Date Noted   Gait disorder 09/14/2023   Seizure disorder (HCC) 09/10/2023   History of CVA (cerebrovascular accident) 09/09/2023   Essential hypertension 09/09/2023   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 Pt was asked to name 1-2 things that are difficult for him at home but he felt he is able to perform everything okay (with difficult with expressing it in words)  Pt accompanied by: self and family member - mother [name]  PERTINENT HISTORY: past medical history of Allergy, Complication of anesthesia, Diabetes mellitus (HCC), GERD (gastroesophageal reflux  disease), Headache, History of kidney stones, Hyperlipidemia, and Renal disorder. 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  PAIN:  Are you having pain? No  PRECAUTIONS: Fall and Other: Seizures and L visual cut    PATIENT GOALS: Improve walking, balance                                                                                            TREATMENT DATE:   Gait training: 1 x 230' without AD and CGA, 3 bouts of toe catch noted with left foot.  Assessed ankle DF ROM: mildly limited in L ankle, gastroc mm length mildy limited in L ankle MMT for L ankle DF: 5/5 bil  Gastroc stretch: with 2" board under balls of feet: 3 x 30" bil Negative heel raises off edge of step: 3 x 10 Resisted walking: 10' with constant resistance, intermittently letting go of resistance and lateral pulls. Pt reported the has 1/2 inch leg length discrepancy with R leg being shorter since he was born.  Tried external wedge under R foot but due to bad velcro, it didn't stay on Placed 1/4" internal wedge under R heel and ambulated 1 x 460' no LOB noted. Pt educated to keep heel wedge on and to remove it if it is uncomfortable.     PATIENT EDUCATION: Education details: continue HEP, see above, PT recommending 24/7 supervision Person educated: Patient and Parent Education method: Explanation, Demonstration, and Handouts Education comprehension: verbalized understanding and needs further education  HOME EXERCISE PROGRAM: Access Code: [email or web address] URL: [email or web address] Date: 11/25/2023 Prepared by: Susanna Epley  Exercises - Half Deadlift with Kettlebell  - 1 x daily - 4 x weekly - 3 sets - 10 reps - Sit to Stand Without Arm Support  - 1 x daily - 4 x weekly - 3 sets - 10 reps - Standing Gastroc Stretch on Step with Counter Support  - 1 x daily - 7 x weekly - 3 sets - 30 sec hold - Heel Raise on Step  - 1 x daily - 7 x weekly - 3 sets - 10 reps GOALS: Goals  reviewed with patient? Yes  SHORT TERM GOALS: Target date: 11/10/2023  Pt will demo >50% compliance with HEP to self manage symptoms. Baseline: TBD, reports doing HEP about 30% of the time Goal status: NOT MET   LONG TERM GOALS: Target date: 12/30/2023     Patient will demo 5x sit to stand score of <16 sec to improve overall functional strength. Baseline: 34 s without UE support; 19 seconds (11/18/23) Goal status: Progressing continue   2.  Pt will demo functional gait assessment score of >24/30 to improve overall balance with functional gait. Baseline: 20/30 (eval); 21/30 (11/18/23) Goal status: Progressing continue  3.  Pt will be able to perform reciprocal gait pattern when descending stairs with use of one rail Baseline: step to pattern when descending (leading with L LE) with one rail (10/13/23)- same as eval (11/18/23) Goal status: Progressing continue  4.  Patient will demo gait speed of >0.90 m/s to improve functional mobility in community. Baseline: TBD, 1.3 m/s ; 0.94 m/s (11/25/23) Goal status: MET  5.  Pt will demo improved overall safety awareness and decision making to avoid risk for fall and report no falls during course of 4 weeks consecutively. Baseline: Pt and mother self reported of no falls or near falls. Goal status: Goal met   ASSESSMENT:  CLINICAL IMPRESSION: Patient had 3 bouts of L toe catching on the ground. Pt was able to recover on his own but it can lead to potential fall. ROM in ankle and strength of dorsiflexor was assessed and there was mild limitation with L ankle dorsiflexion (gastroc mm length limited- not soleus). Pt was given HEP to improve flexibility of his ankles and some eccentric dorsiflexion work.   OBJECTIVE IMPAIRMENTS: Abnormal gait, decreased activity tolerance, decreased balance, decreased endurance, decreased knowledge of condition, decreased mobility, difficulty walking, decreased strength, decreased safety awareness, impaired  perceived functional ability, impaired tone, impaired UE functional use, and impaired vision/preception.   ACTIVITY LIMITATIONS: carrying, lifting, bending, standing, squatting, stairs, transfers, and locomotion level  PARTICIPATION LIMITATIONS: meal prep, cleaning, laundry, driving, shopping, community activity, occupation, and yard work  PERSONAL FACTORS: Time since onset of injury/illness/exacerbation and 3+ comorbidities: brain tumor, CVA, seizures are also affecting patient's functional outcome.   REHAB POTENTIAL: Good  CLINICAL DECISION MAKING: Stable/uncomplicated  EVALUATION COMPLEXITY: Low  PLAN:  PT FREQUENCY: 1-2x/week  PT DURATION: 6 weeks  PLANNED INTERVENTIONS: 97164- PT Re-evaluation, 97750- Physical Performance Testing, 97110-Therapeutic exercises, 97530- Therapeutic activity, 97112- Neuromuscular re-education, 97535- Self Care, 40981- Manual therapy, [phone]- Gait training, Patient/Family education, Balance training, Stair training, Joint mobilization, Visual/preceptual remediation/compensation, DME instructions, Cryotherapy, and Moist heat  PLAN FOR NEXT SESSION: Continue with skilled PT   Kristine Phalen, PT 11/25/2023, 9:31 AM

## 2023-11-25 NOTE — Patient Instructions (Signed)
   Please bring in the communication book/pages you have have been working on at home  [employer] in communication book  You can use drawing to communicate as well

## 2023-11-30 ENCOUNTER — Ambulatory Visit: Admitting: Occupational Therapy

## 2023-11-30 ENCOUNTER — Ambulatory Visit

## 2023-11-30 DIAGNOSIS — R29818 Other symptoms and signs involving the nervous system: Secondary | ICD-10-CM

## 2023-11-30 DIAGNOSIS — M6281 Muscle weakness (generalized): Secondary | ICD-10-CM

## 2023-11-30 DIAGNOSIS — R41842 Visuospatial deficit: Secondary | ICD-10-CM

## 2023-11-30 DIAGNOSIS — R2689 Other abnormalities of gait and mobility: Secondary | ICD-10-CM

## 2023-11-30 DIAGNOSIS — R278 Other lack of coordination: Secondary | ICD-10-CM

## 2023-11-30 DIAGNOSIS — I6300 Cerebral infarction due to thrombosis of unspecified precerebral artery: Secondary | ICD-10-CM

## 2023-11-30 DIAGNOSIS — R41844 Frontal lobe and executive function deficit: Secondary | ICD-10-CM

## 2023-11-30 NOTE — Therapy (Unsigned)
 OUTPATIENT OCCUPATIONAL THERAPY NEURO TREATMENT  Patient Name: Alan Wise MRN: [id number] DOB:12-23-1970, 53 y.o., male Today's Date: 11/30/2023  PCP: Carollynn Cirri, NP REFERRING PROVIDER: Celia Coles, MD  END OF SESSION:  OT End of Session - 11/30/23 1025     Visit Number 12    Number of Visits 16    Date for OT Re-Evaluation 12/18/23    Authorization Type [employer] 2025 OOPM MET    Authorization Time Period Covered 100% VL:MN Auth Not Reqd    OT Start Time 1020    OT Stop Time 1113    OT Time Calculation (min) 53 min    Activity Tolerance Patient tolerated treatment well    Behavior During Therapy WFL for tasks assessed/performed            Past Medical History:  Diagnosis Date   Allergy    Complication of anesthesia    pt reports he is starting to have more difficulty arousing after surgery   Diabetes mellitus (HCC)    GERD (gastroesophageal reflux disease)    Headache    History of kidney stones    Hyperlipidemia    Renal disorder    kidney stones   Past Surgical History:  Procedure Laterality Date   APPLICATION OF CRANIAL NAVIGATION Right 01/17/2021   Procedure: APPLICATION OF CRANIAL NAVIGATION;  Surgeon: Van Gelinas, MD;  Location: [facility] OR;  Service: Neurosurgery;  Laterality: Right;   COLONOSCOPY  09/03/1994   COLONOSCOPY WITH PROPOFOL  N/A 01/07/2023   Procedure: COLONOSCOPY WITH PROPOFOL ;  Surgeon: Marnee Sink, MD;  Location: [facility] ENDOSCOPY;  Service: Endoscopy;  Laterality: N/A;  NOT TOO EARLY   CYSTOSCOPY WITH STENT PLACEMENT Right 01/25/2016   Procedure: CYSTOSCOPY WITH STENT PLACEMENT;  Surgeon: Bart Born, MD;  Location: [facility] ORS;  Service: Urology;  Laterality: Right;   FRAMELESS  BIOPSY WITH BRAINLAB Right 01/17/2021   Procedure: RIGHT STEREOTACTIC BIOPSY OF INSULAR LESION;  Surgeon: Van Gelinas, MD;  Location: [facility] OR;  Service: Neurosurgery;  Laterality: Right;   KNEE ARTHROSCOPY W/ MENISCAL REPAIR Right 06/23/1988    POLYPECTOMY  01/07/2023   Procedure: POLYPECTOMY;  Surgeon: Marnee Sink, MD;  Location: [facility] ENDOSCOPY;  Service: Endoscopy;;   removal of birthmark  06/08/2013   SKIN CANCER EXCISION  06/23/2012   TYMPANOSTOMY TUBE PLACEMENT  08/06/1978   URETEROSCOPY WITH HOLMIUM LASER LITHOTRIPSY Right 01/25/2016   Procedure: URETEROSCOPY WITH HOLMIUM LASER LITHOTRIPSY;  Surgeon: Bart Born, MD;  Location: [facility] ORS;  Service: Urology;  Laterality: Right;   URETHRAL STRICTURE DILATATION     visual inspection of vocal cord     1973   WISDOM TOOTH EXTRACTION     Patient Active Problem List   Diagnosis Date Noted   Gait disorder 09/14/2023   Seizure disorder (HCC) 09/10/2023   History of CVA (cerebrovascular accident) 09/09/2023   Essential hypertension 09/09/2023   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   Type 2 diabetes mellitus with hyperglycemia (HCC) 06/12/2014   Hyperlipidemia associated with type 2 diabetes mellitus (HCC) 06/12/2014    ONSET DATE: Referral: 09/23/2023  Hospitalization 3/30 - 09/25/23  REFERRING DIAG: I63.9 (ICD-10-CM) - Cerebral infarction, unspecified R frontal oligodendroglioma  THERAPY DIAG:  Muscle weakness (generalized)  Visuospatial deficit  Other lack of coordination  Other symptoms and signs involving the nervous system  Frontal lobe and executive function deficit  Rationale for Evaluation and Treatment: Rehabilitation  SUBJECTIVE:   SUBJECTIVE STATEMENT:  Pt's mother  reports she will be staying with pt next week when his wife and children are on vacation.  No changes to meds, falls or pain reported.  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  Presented to the hospital 09/14/2023 with worsening gait left-sided weakness/aphasia and  new onset dysphagia.  Of note, he was recently hospitalized from 3-19 to 3-21 with new CVA and breakthrough seizures.  In the ED he was started on IV fluids and MRI of the brain showed new punctate focus of restricted diffusion in the anterior insular cortex compatible with new acute/subacute infarct, stable 8 mm acute/subacute infarct involving the right cerebral peduncle, stable restricted diffusion in the anterior frontal lobe and along the atrium of the right lateral ventricle consistent with treated tumor, improved diffusion changes within the genu of the corpus callosum suggesting these were ischemic, stable chronic encephalomalacia of the right frontal operculum, and stable periventricular T2 hyperintensities in the left hemisphere likely reflects the sequela of chronic microvascular ischemia. He was started on Eliquis , AEDs were adjusted and admitted with evaluation by neurology and Dr. Mark Sil with neuro-oncology, because of recent decline determined to be multifocal secondary to radiation necrosis, breakthrough seizures and multiple recent strokes.  Hospitalization complicated by dysphagia and GI determined to be neurologic.  Last known seizure 09/16/23, with subsequent increase nightly Lamictal .  Therapy evaluations completed due to patient decreased functional mobility was admitted for a comprehensive rehab program.  PRECAUTIONS: Fall  WEIGHT BEARING RESTRICTIONS: No  PAIN:  Are you having pain? No  FALLS: Has patient fallen in last 6 months? Yes. Number of falls 3 - trying to put pants on, ladder before stroke, carrying cat litter and lost balance on steps  LIVING ENVIRONMENT:  Home Living/Prior Functioning Home Living: Private residence Living Arrangements: Spouse - [name], Children - daughter [name], son 5yo Available Help at Discharge: Family, Available 24 hours/day Type of Home: House Home Access: Stairs to enter (stone steps) Entrance Stairs-Number of Steps: 4 Entrance  Stairs-Rails: Right, Left Home Layout: Two level, Able to live on main level with bedroom/bathroom (can live on main level) Alternate Level Stairs-Number of Steps: flight of steps to 2nd floor bedroom Alternate Level Stairs-Rails:  (L for first 5 and R on second 5) Bathroom Shower/Tub: Psychologist, counselling, Sport and exercise psychologist: Standard Bathroom Accessibility: Yes IADL History Homemaking Responsibilities: No Prior Function/Level of Independence: Independent with basic ADLs, Independent with transfers, Independent with gait, Independent with homemaking with ambulation Able to Take Stairs?: Yes Driving: No (not driving for several years due to seizures)  PLOF: Independent with basic ADLs and Independent with household mobility without device Prior interests: Wrestled, ball room dance, soccer, professor of mathematics at [employer]  PATIENT GOALS: Pt reported he needs to work on L side awareness, vision on left side, strength and putting jacket on.  OBJECTIVE:  Note: Objective measures were completed at Evaluation unless otherwise noted.  HAND DOMINANCE: Left  ADLs: Overall ADLs: Min assist to Mod Ind Transfers/ambulation related to ADLs: Mod Ind Eating: Pt switched to R hand for eating, wife helps cut food Grooming: Wife used clippers recently UB Dressing: Reports he struggles with button up shirt/jacket,  LB Dressing: Pt spouse report he fell with LB dressing in standing, wife confirms he can tie shoelaces Toileting: Ind Bathing: Mod I Tub Shower transfers: walk in shower with built in bench with B showerheads Equipment:  none  IADLs: Shopping: wife Light housekeeping: wife Meal Prep: wife Community mobility: Supervision Medication management: Wife assists Financial management: Wife Handwriting: Increased time - due to aphasia (June 2024 aphasia, speech, reading)  MOBILITY STATUS: Hx of falls  POSTURE COMMENTS:  rounded shoulders and forward head Sitting balance: WFL  ACTIVITY  TOLERANCE: Activity tolerance: Fair  FUNCTIONAL OUTCOME MEASURES: TBA  UPPER EXTREMITY ROM:    Active ROM Right eval Left eval  Shoulder flexion 120 90  Shoulder abduction    Shoulder adduction    Shoulder extension    Shoulder internal rotation    Shoulder external rotation    Elbow flexion WNL WFL  Elbow extension WNL WFL  Wrist flexion  Sight limits  Wrist extension    Wrist ulnar deviation    Wrist radial deviation    Wrist pronation    Wrist supination    (Blank rows = not tested)  UPPER EXTREMITY MMT:     MMT Right eval Left eval  Shoulder flexion 4 3-  Shoulder abduction    Shoulder adduction    Shoulder extension    Shoulder internal rotation    Shoulder external rotation    Middle trapezius    Lower trapezius    Elbow flexion 5 4+  Elbow extension 4+ 4  Wrist flexion    Wrist extension    Wrist ulnar deviation    Wrist radial deviation    Wrist pronation    Wrist supination    (Blank rows = not tested)  HAND FUNCTION: Grip strength: Right: 53.5, 57.5, 53.3  lbs; Left: 42.9, 51.8, 41.8  lbs Right: 54.8 lbs Left: 45.5 lbs  COORDINATION: 9 Hole Peg test: Right: 41.24  sec; Left: 1:04.00 sec  SENSATION: Patient initially reported normal but then noted he may have some limtations ie) does not know   EDEMA: NA  MUSCLE TONE: Some changes ie) not as smooth with movements and coordination  COGNITION: Overall cognitive status: Impaired/Aphasia impacts cognition Hospital: [facility] Summary Score: 12   VISION: Subjective report: January - vision was decreasing Baseline vision: Wears glasses for reading only about 1 year Visual history: changes were happening perhaps a precursor to CVA  MR review: Vision Baseline Vision/History: 1 Wears glasses Ability to See in Adequate Light: 1 Impaired Patient Visual Report: Peripheral vision impairment Vision Assessment?: Wears glasses for reading Eye Alignment: Within Functional Limits Alignment/Gaze  Preference: Within Defined Limits Visual Fields: Left homonymous hemianopsia Additional Comments: L peripheral vision decr since January  VISION ASSESSMENT: Tracking/Visual pursuits: Impaired - pt has trouble with vision on L side  Patient has difficulty with following activities due to following visual impairments: walks into walls, chairs etc, ie) doesn't always scan especially if he is tired, agitated, etc  10/22/23 -  R side peripheral vision: approx. 70* L side peripheral vision: visual field cut, unable to see marker until midline.  Pt reported difficulty seeing objects on L side.  PERCEPTION: Not tested  PRAXIS: Not tested  TODAY'S TREATMENT :    - Therapeutic activities completed for duration as noted below including: Pt engaged in table top visual scanning task combined with motor putty task for ROM, strength, coordination and tracking, along with a cognitive component.   Pt engaged in pinching and pulling putty from R side of table to numbers randomly drawn on colored folder on his L side in numerical order.  Pt is very aware of the location where his vision is affected and does well turning to his L side to find the numbers with challenges added ie) not using R hand to stabilize putty so he had to turn back to get more putty to pull to his L side. Also added cognitive math aspect ie) pull to the answer to questions ie) 2+5, square root of 25, 1000/100 etc.  Pt only got mixed up by 108\12.  Max cues/instruction given again on how to incorporate his children into his therapy activities ie) using a badminton racquet and balloon to tap back and forth, puzzle activity as described last week etc. Caregiver training without patient At end of session, patient went with the physical therapist and pt's mother stayed for one on one conversation/education re: home based  activities ie) placing images at different locations to encouraged different exercises ie) at bedside, at the table in the living room etc as she reports a difficulty in initiating carryover. Also could use images for a daily schedule in addition to helping with daily cleaning activities etc.  PATIENT EDUCATION: Education details: HEP ideas Person educated: Patient and Mother Education method: Explanation, Demonstration, Tactile cues, and Verbal cues Education comprehension: verbalized understanding, returned demonstration, verbal cues required, tactile cues required, and needs further education  HOME EXERCISE PROGRAM: 10/20/2023: scanning activities; vision strategies 10/22/2023: PUTTY Access Code: [email or web address]  11/18/2023: dowel HEP 11/23/23: updated putty HEP - same access code  GOALS: Goals reviewed with patient? Yes  SHORT TERM GOALS: Target date: 11/20/23  Patient will demonstrate initial UE HEP with 25% verbal cues or less for proper execution (d/t B shoulder limitations and L sided weakness s/p CVA). Baseline: New to outpt OT Goal status: MET   2.  Patient will demonstrate at least 5+ lbs improvement for BUE grip strength as needed to open jars and other containers. Baseline: Right: 54.8 lbs Left: 45.5 lbs 11/20/2023: 58.6 lbs Left: 49.3 lbs Goal status: IN PROGRESS   3.  Patient will demo improved FM coordination as evidenced by completing nine-hole peg 5+ seconds faster with use of BUE.  Baseline: 9 Hole Peg test: Right: 41.24  sec; Left: 1:04.00 sec 11/20/2023: Right: 36 sec; Left: 47 sec Goal status: MET   4.  Patient will independently recall at least 2 compensatory strategies for visual impairment without cueing. Baseline: New to outpt OT - bumps into furniture Goal status: IN Progress 6/2 - still bumps things on L side   5. Pt will verbalize understanding of modifications, strategies and/or equipment PRN with visual cues as needed to increase safety and independence with ADLs  and IADLs (I.e. with decreased fall risk). Baseline: New to outpt OT - fell donning pants Goal Status: MET - Pt reports independence   LONG TERM GOALS: Target date: 12/18/23   Patient will demonstrate updated UE HEP with visual handouts only for proper execution.  Baseline: New to outpt OT  Goal status: IN Progress   2.  Patient will return demonstration of visual adaptation use and verbalize understanding of how to obtain item(s) if desired.  Baseline:  New to outpt OT Goal status: IN Progress   3.  Patient will demo improved FM coordination as evidenced by completing nine-hole peg with use of BUE as close to 30 seconds as possible.  Baseline: 9 Hole Peg test: Right: 41.24  sec; Left: 1:04.00 sec Goal status: IN Progress 11/20/23: Right: 36 sec; Left: 47 sec   4.  Pt will write/print his name/initials. Baseline: difficult due to aphasia by report Goal status: MET   5.  Pt will report increased ease with donning all clothing items - shirt, socks/shoes without assistance of spouse Baseline: occasional assistance of spouse Goal status: MET  ASSESSMENT:  CLINICAL IMPRESSION: Patient is a 53 y.o. male who was seen today for occupational therapy treatment for UE weakness and visual deficits s/p CVA. Pt presents below baseline level of function with visual regard to L side but in seated task can scan to his L with >50% success.  Pt does get frustrated with failure with tasks that he feels should be easy based on previous interests ie) was good at math before.  He also needs extra cues - verbal, tactile and visual to improve follow through with different/novel activiteis.  Pt will benefit from continued skilled OT services in the outpatient setting to work on impairments as noted at evaluation to help pt return to [facility] as able.    PERFORMANCE DEFICITS: in functional skills including ADLs, IADLs, coordination, dexterity, proprioception, sensation, tone, ROM, strength, pain, fascial restrictions,  muscle spasms, flexibility, Fine motor control, Gross motor control, mobility, balance, body mechanics, endurance, decreased knowledge of precautions, decreased knowledge of use of DME, vision, and UE functional use, cognitive skills including attention, emotional, energy/drive, learn, memory, perception, problem solving, safety awareness, sequencing, temperament/personality, thought, and understand, and psychosocial skills including coping strategies, environmental adaptation, habits, interpersonal interactions, and routines and behaviors.   IMPAIRMENTS: are limiting patient from ADLs, IADLs, rest and sleep, education, work, play, leisure, and social participation.   CO-MORBIDITIES: has co-morbidities such as h/o frontal oligodendroglioma  that affects occupational performance. Patient will benefit from skilled OT to address above impairments and improve overall function.  REHAB POTENTIAL: Good  PLAN:  OT FREQUENCY: 1-2x/week  OT DURATION: 8 weeks  PLANNED INTERVENTIONS: 97535 self care/ADL training, 16109 therapeutic exercise, 97530 therapeutic activity, 97112 neuromuscular re-education, 97140 manual therapy, balance training, functional mobility training, visual/perceptual remediation/compensation, psychosocial skills training, energy conservation, coping strategies training, patient/family education, and DME and/or AE instructions  RECOMMENDED OTHER SERVICES: NA - other therapies already scheduled at this time  CONSULTED AND AGREED WITH PLAN OF CARE: Patient and family member/caregiver  PLAN FOR NEXT SESSION:   Daily/weekly picture schedule/cleaning caddies  Hallway balloon/scarf with sign recognition/response  Visual scanning activities - encourage L side awareness (balloon + scanning) Review theraputty HEP HEP update: lower-level coordination (2-4 exercises) HEP schedule Visual scan to L with putty   Zora Hires, OT 11/30/2023, 2:51 PM

## 2023-11-30 NOTE — Therapy (Signed)
 OUTPATIENT PHYSICAL THERAPY NEURO TREATMENT    Patient Name: Alan Wise MRN: [id number] DOB:05-May-1971, 53 y.o., male Today's Date: 11/30/2023   PCP: Helayne Lo, NP REFERRING PROVIDER: Celia Coles, MD  END OF SESSION:  PT End of Session - 11/30/23 1105     Visit Number 10    Number of Visits 18    Date for PT Re-Evaluation 12/30/23    Authorization Type [employer]    PT Start Time 1100    PT Stop Time 1145    PT Time Calculation (min) 45 min    Equipment Utilized During Treatment Gait belt    Activity Tolerance Patient tolerated treatment well    Behavior During Therapy WFL for tasks assessed/performed;Agitated             Past Medical History:  Diagnosis Date   Allergy    Complication of anesthesia    pt reports he is starting to have more difficulty arousing after surgery   Diabetes mellitus (HCC)    GERD (gastroesophageal reflux disease)    Headache    History of kidney stones    Hyperlipidemia    Renal disorder    kidney stones   Past Surgical History:  Procedure Laterality Date   APPLICATION OF CRANIAL NAVIGATION Right 01/17/2021   Procedure: APPLICATION OF CRANIAL NAVIGATION;  Surgeon: Van Gelinas, MD;  Location: [facility] OR;  Service: Neurosurgery;  Laterality: Right;   COLONOSCOPY  09/03/1994   COLONOSCOPY WITH PROPOFOL  N/A 01/07/2023   Procedure: COLONOSCOPY WITH PROPOFOL ;  Surgeon: Marnee Sink, MD;  Location: [facility] ENDOSCOPY;  Service: Endoscopy;  Laterality: N/A;  NOT TOO EARLY   CYSTOSCOPY WITH STENT PLACEMENT Right 01/25/2016   Procedure: CYSTOSCOPY WITH STENT PLACEMENT;  Surgeon: Bart Born, MD;  Location: [facility] ORS;  Service: Urology;  Laterality: Right;   FRAMELESS  BIOPSY WITH BRAINLAB Right 01/17/2021   Procedure: RIGHT STEREOTACTIC BIOPSY OF INSULAR LESION;  Surgeon: Van Gelinas, MD;  Location: [facility] OR;  Service: Neurosurgery;  Laterality: Right;   KNEE ARTHROSCOPY W/ MENISCAL REPAIR Right 06/23/1988   POLYPECTOMY   01/07/2023   Procedure: POLYPECTOMY;  Surgeon: Marnee Sink, MD;  Location: [facility] ENDOSCOPY;  Service: Endoscopy;;   removal of birthmark  06/08/2013   SKIN CANCER EXCISION  06/23/2012   TYMPANOSTOMY TUBE PLACEMENT  08/06/1978   URETEROSCOPY WITH HOLMIUM LASER LITHOTRIPSY Right 01/25/2016   Procedure: URETEROSCOPY WITH HOLMIUM LASER LITHOTRIPSY;  Surgeon: Bart Born, MD;  Location: [facility] ORS;  Service: Urology;  Laterality: Right;   URETHRAL STRICTURE DILATATION     visual inspection of vocal cord     1973   WISDOM TOOTH EXTRACTION     Patient Active Problem List   Diagnosis Date Noted   Gait disorder 09/14/2023   Seizure disorder (HCC) 09/10/2023   History of CVA (cerebrovascular accident) 09/09/2023   Essential hypertension 09/09/2023   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 No falls or near falls. Pt still has heel lift in the shoe. Pt reports he hasn't noticed any changes with the heel lift  Pt accompanied by: self and family member - mother [name]  PERTINENT HISTORY: past medical history of Allergy, Complication of anesthesia, Diabetes mellitus (HCC), GERD (gastroesophageal reflux disease), Headache, History of kidney stones,  Hyperlipidemia, and Renal disorder. 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  PAIN:  Are you having pain? No  PRECAUTIONS: Fall and Other: Seizures and L visual cut    PATIENT GOALS: Improve walking, balance                                                                                            TREATMENT DATE:   Gait training: with heel lift in the shoe, 1 x  1380 feet with varying speeds upon therapists cues. Patient had 5-6 bouts of "stumbles" with L LE but patient was given Min A, mainly due to safetly. It was noted that patient has more profound stumbles when he is walking faster and with these stumbles he may need external assistance to prevent fall from occurring. Pt suspected that he might be having stumbles because of the heel lift.  Thomas test: negative bil Hamstring length test: bil 35 deg Patient's L piriformis was tighter compared to Right. Passively stretched L piriformis and had patient perform L piriformis stretch for 3 x 30"  Heel lift removed from the R shoe  Gait training: 1 x 1380 feet again with varying speeds upon therapists cues, no significant change noted in his stumbles as he had 5-6 stumbles and were more noticeable when he was walking faster.  Pt was educated that based on the evaluation today, he should be careful when he is walking faster. PT was advised NOT to run (as pt was trying to say that when he ran when he was younger, he wouldn't have these stumbles). Pt was educated that stumbles do occur when he is walking at normal speed but they are not as severe and he is able to recover on his own without external assistance. Pt was educated that his stumbles are inconsistent and it seems like it is coordination issue at faster walking speed.  Stair training: SBA only, 20 steps with reciprocal gait pattern and bil rail; 20 steps with reciprocal steps with using rail on R going up and rail on L coming down, 12 steps with reciprocal  steps with rail on L going up and rail on R going down. Pt educated on using step to pattern or reciprocal gait pattern on what ever seems comfortable at home.      PATIENT EDUCATION: Education details: continue HEP, see above, PT recommending 24/7 supervision Person educated: Patient and Parent Education method: Explanation, Demonstration, and Handouts Education comprehension: verbalized understanding and needs further education  HOME EXERCISE PROGRAM: Access Code: [email or web address] URL: [email or web address] Date: 11/25/2023 Prepared by: Susanna Epley  Exercises - Half Deadlift with Kettlebell  - 1 x daily - 4 x weekly - 3 sets - 10 reps - Sit to Stand Without Arm Support  -  1 x daily - 4 x weekly - 3 sets - 10 reps - Standing Gastroc Stretch on Step with Counter Support  - 1 x daily - 7 x weekly - 3 sets - 30 sec hold - Heel Raise on Step  - 1 x daily - 7 x weekly - 3 sets - 10 reps GOALS: Goals reviewed with patient? Yes  SHORT TERM GOALS: Target date: 11/10/2023  Pt will demo >50% compliance with HEP to self manage symptoms. Baseline: TBD, reports doing HEP about 30% of the time Goal status: NOT MET   LONG TERM GOALS: Target date: 12/30/2023     Patient will demo 5x sit to stand score of <16 sec to improve overall functional strength. Baseline: 34 s without UE support; 19 seconds (11/18/23); 10.25 seconds (11/30/23) Goal status: MET  2.  Pt will demo functional gait assessment score of >24/30 to improve overall balance with functional gait. Baseline: 20/30 (eval); 21/30 (11/18/23);  Goal status: Progressing continue  3.  Pt will be able to perform reciprocal gait pattern when descending stairs with use of one rail Baseline: step to pattern when descending (leading with L LE) with one rail (10/13/23)- same as eval (11/18/23); able to perform with SBA only Goal status: MET  4.  Patient will demo gait speed of >0.90 m/s to improve functional mobility in  community. Baseline: TBD, 1.3 m/s ; 0.94 m/s (11/25/23) Goal status: MET  5.  Pt will demo improved overall safety awareness and decision making to avoid risk for fall and report no falls during course of 4 weeks consecutively. Baseline: Pt and mother self reported of no falls or near falls. Goal status: Goal met   ASSESSMENT:  CLINICAL IMPRESSION: Ptoday's session focused on further gait evaluation and training and stair training. Pt achieved LTG#1 and 3 today today.   OBJECTIVE IMPAIRMENTS: Abnormal gait, decreased activity tolerance, decreased balance, decreased endurance, decreased knowledge of condition, decreased mobility, difficulty walking, decreased strength, decreased safety awareness, impaired perceived functional ability, impaired tone, impaired UE functional use, and impaired vision/preception.   ACTIVITY LIMITATIONS: carrying, lifting, bending, standing, squatting, stairs, transfers, and locomotion level  PARTICIPATION LIMITATIONS: meal prep, cleaning, laundry, driving, shopping, community activity, occupation, and yard work  PERSONAL FACTORS: Time since onset of injury/illness/exacerbation and 3+ comorbidities: brain tumor, CVA, seizures are also affecting patient's functional outcome.   REHAB POTENTIAL: Good  CLINICAL DECISION MAKING: Stable/uncomplicated  EVALUATION COMPLEXITY: Low  PLAN:  PT FREQUENCY: 1-2x/week  PT DURATION: 6 weeks  PLANNED INTERVENTIONS: 97164- PT Re-evaluation, 97750- Physical Performance Testing, 97110-Therapeutic exercises, 97530- Therapeutic activity, 97112- Neuromuscular re-education, 97535- Self Care, 40981- Manual therapy, [phone]- Gait training, Patient/Family education, Balance training, Stair training, Joint mobilization, Visual/preceptual remediation/compensation, DME instructions, Cryotherapy, and Moist heat  PLAN FOR NEXT SESSION: Continue with skilled PT   Kristine Phalen, PT 11/30/2023, 11:05 AM

## 2023-12-02 ENCOUNTER — Ambulatory Visit

## 2023-12-02 ENCOUNTER — Ambulatory Visit: Admitting: Speech Pathology

## 2023-12-02 ENCOUNTER — Ambulatory Visit: Admitting: Occupational Therapy

## 2023-12-02 DIAGNOSIS — M6281 Muscle weakness (generalized): Secondary | ICD-10-CM

## 2023-12-02 DIAGNOSIS — R278 Other lack of coordination: Secondary | ICD-10-CM

## 2023-12-02 DIAGNOSIS — R41842 Visuospatial deficit: Secondary | ICD-10-CM

## 2023-12-02 DIAGNOSIS — R41841 Cognitive communication deficit: Secondary | ICD-10-CM

## 2023-12-02 DIAGNOSIS — R4701 Aphasia: Secondary | ICD-10-CM

## 2023-12-02 DIAGNOSIS — R41844 Frontal lobe and executive function deficit: Secondary | ICD-10-CM

## 2023-12-02 DIAGNOSIS — R2689 Other abnormalities of gait and mobility: Secondary | ICD-10-CM

## 2023-12-02 DIAGNOSIS — I6300 Cerebral infarction due to thrombosis of unspecified precerebral artery: Secondary | ICD-10-CM

## 2023-12-02 DIAGNOSIS — R29818 Other symptoms and signs involving the nervous system: Secondary | ICD-10-CM

## 2023-12-02 NOTE — Therapy (Signed)
 OUTPATIENT PHYSICAL THERAPY NEURO TREATMENT    Patient Name: Alan Wise MRN: [id number] DOB:January 24, 1971, 53 y.o., male Today's Date: 12/02/2023   PCP: Helayne Lo, NP REFERRING PROVIDER: Celia Coles, MD  END OF SESSION:  PT End of Session - 12/02/23 1012     Visit Number 11    Number of Visits 18    Date for PT Re-Evaluation 12/30/23    Authorization Type [employer]    PT Start Time 0930    PT Stop Time 1015    PT Time Calculation (min) 45 min    Equipment Utilized During Treatment Gait belt    Activity Tolerance Patient tolerated treatment well    Behavior During Therapy WFL for tasks assessed/performed;Agitated              Past Medical History:  Diagnosis Date   Allergy    Complication of anesthesia    pt reports he is starting to have more difficulty arousing after surgery   Diabetes mellitus (HCC)    GERD (gastroesophageal reflux disease)    Headache    History of kidney stones    Hyperlipidemia    Renal disorder    kidney stones   Past Surgical History:  Procedure Laterality Date   APPLICATION OF CRANIAL NAVIGATION Right 01/17/2021   Procedure: APPLICATION OF CRANIAL NAVIGATION;  Surgeon: Van Gelinas, MD;  Location: [facility] OR;  Service: Neurosurgery;  Laterality: Right;   COLONOSCOPY  09/03/1994   COLONOSCOPY WITH PROPOFOL  N/A 01/07/2023   Procedure: COLONOSCOPY WITH PROPOFOL ;  Surgeon: Marnee Sink, MD;  Location: [facility] ENDOSCOPY;  Service: Endoscopy;  Laterality: N/A;  NOT TOO EARLY   CYSTOSCOPY WITH STENT PLACEMENT Right 01/25/2016   Procedure: CYSTOSCOPY WITH STENT PLACEMENT;  Surgeon: Bart Born, MD;  Location: [facility] ORS;  Service: Urology;  Laterality: Right;   FRAMELESS  BIOPSY WITH BRAINLAB Right 01/17/2021   Procedure: RIGHT STEREOTACTIC BIOPSY OF INSULAR LESION;  Surgeon: Van Gelinas, MD;  Location: [facility] OR;  Service: Neurosurgery;  Laterality: Right;   KNEE ARTHROSCOPY W/ MENISCAL REPAIR Right 06/23/1988    POLYPECTOMY  01/07/2023   Procedure: POLYPECTOMY;  Surgeon: Marnee Sink, MD;  Location: [facility] ENDOSCOPY;  Service: Endoscopy;;   removal of birthmark  06/08/2013   SKIN CANCER EXCISION  06/23/2012   TYMPANOSTOMY TUBE PLACEMENT  08/06/1978   URETEROSCOPY WITH HOLMIUM LASER LITHOTRIPSY Right 01/25/2016   Procedure: URETEROSCOPY WITH HOLMIUM LASER LITHOTRIPSY;  Surgeon: Bart Born, MD;  Location: [facility] ORS;  Service: Urology;  Laterality: Right;   URETHRAL STRICTURE DILATATION     visual inspection of vocal cord     1973   WISDOM TOOTH EXTRACTION     Patient Active Problem List   Diagnosis Date Noted   Gait disorder 09/14/2023   Seizure disorder (HCC) 09/10/2023   History of CVA (cerebrovascular accident) 09/09/2023   Essential hypertension 09/09/2023   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 No falls or near falls. Pt still has heel lift in the shoe. Pt reports he hasn't noticed any changes with the heel lift  Pt accompanied by: self and family member - mother [name]  PERTINENT HISTORY: past medical history of Allergy, Complication of anesthesia, Diabetes mellitus (HCC), GERD (gastroesophageal reflux disease), Headache, History of kidney  stones, Hyperlipidemia, and Renal disorder. 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  PAIN:  Are you having pain? No  PRECAUTIONS: Fall and Other: Seizures and L visual cut    PATIENT GOALS: Improve walking, balance                                                                                            TREATMENT DATE:  Gait training: 1 x 2300 feet with purple resistance band resistance byPT. Pt provided varying resistance including constant resistance, intermittent resistance, lateral and posterior perutrbation with walking. Pt had 8 bouts of trips. With therapist standing back further from patient, we were able to figure out what was causing his trips. 5 trips: where patient hit inside of left heel during L swing phase to back of R heel  2 trips: where patient hit inside of Right heel during R swing phase to back of L heel 1 trip: where patient caught medial portion of his left foot due to poor foot clearance.   Stair training: SBA only, 12 steps with rail on R and 12 steps with rail on L with reciprocal gait pattern; 8 steps with rail on L and 15lb weight in contralateral hand and 8 steps with rail on R and 15lbs on contralateral hand.  Pt educated that if he carries something with handle, he can carry it up and down stairs as long as he is holding onto the rail. Pt educated to avoid carrying box or basket with 2 handles which may impede his vision and he won't be able to hold on to steps and it poses higher risk of fall on steps.  Stair training: CGA today, pt advised that this is for therapy practice only and he should never try to use stairs without rail at home. 8 steps without rail (going up with reciprocal pattern and coming down with step to pattern leading with L LE); 8 steps with reciprocal gait pattern going up and down the steps.   Gait training: walking on grass for 200' with CGA, cues to look down 2-4 feet ahead Walking fwd and bwd on 5 x 10  feet on gravel path and 5 x 10 feet on rubber multch   PATIENT EDUCATION: Education details: continue HEP, see above, PT recommending 24/7 supervision Person educated: Patient and Parent Education method: Explanation, Demonstration, and Handouts Education comprehension: verbalized understanding and needs further education  HOME EXERCISE PROGRAM: Access Code: [email or web address] URL: [email or web address] Date: 11/25/2023 Prepared by: Susanna Epley  Exercises - Half Deadlift with Kettlebell  - 1 x daily - 4 x weekly - 3 sets - 10 reps - Sit to Stand Without Arm Support  - 1 x daily - 4 x weekly - 3 sets - 10 reps -  Standing Gastroc Stretch on Step with Counter Support  - 1 x daily - 7 x weekly - 3 sets - 30 sec hold - Heel Raise on Step  - 1 x daily - 7 x weekly - 3 sets - 10 reps GOALS: Goals reviewed with patient? Yes  SHORT TERM GOALS: Target date: 11/10/2023  Pt will demo >50% compliance with HEP to self manage symptoms. Baseline: TBD, reports doing HEP about 30% of the time Goal status: NOT MET   LONG TERM GOALS: Target date: 12/30/2023     Patient will demo 5x sit to stand score of <16 sec to improve overall functional strength. Baseline: 34 s without UE support; 19 seconds (11/18/23); 10.25 seconds (11/30/23) Goal status: MET  2.  Pt will demo functional gait assessment score of >24/30 to improve overall balance with functional gait. Baseline: 20/30 (eval); 21/30 (11/18/23);  Goal status: Progressing continue  3.  Pt will be able to perform reciprocal gait pattern when descending stairs with use of one rail Baseline: step to pattern when descending (leading with L LE) with one rail (10/13/23)- same as eval (11/18/23); able to perform with SBA only Goal status: MET  4.  Patient will demo gait speed of >0.90 m/s to improve functional mobility in community. Baseline: TBD, 1.3 m/s ; 0.94 m/s (11/25/23) Goal status: MET  5.  Pt will demo improved overall safety awareness  and decision making to avoid risk for fall and report no falls during course of 4 weeks consecutively. Baseline: Pt and mother self reported of no falls or near falls. Goal status: Goal met   ASSESSMENT:  CLINICAL IMPRESSION: We were able to figure out what was causing patient to trip which is where he hits medial aspect of L heel to posterior aspect of R heel during L swing phase. Overall patient tolerated session well.   OBJECTIVE IMPAIRMENTS: Abnormal gait, decreased activity tolerance, decreased balance, decreased endurance, decreased knowledge of condition, decreased mobility, difficulty walking, decreased strength, decreased safety awareness, impaired perceived functional ability, impaired tone, impaired UE functional use, and impaired vision/preception.   ACTIVITY LIMITATIONS: carrying, lifting, bending, standing, squatting, stairs, transfers, and locomotion level  PARTICIPATION LIMITATIONS: meal prep, cleaning, laundry, driving, shopping, community activity, occupation, and yard work  PERSONAL FACTORS: Time since onset of injury/illness/exacerbation and 3+ comorbidities: brain tumor, CVA, seizures are also affecting patient's functional outcome.   REHAB POTENTIAL: Good  CLINICAL DECISION MAKING: Stable/uncomplicated  EVALUATION COMPLEXITY: Low  PLAN:  PT FREQUENCY: 1-2x/week  PT DURATION: 6 weeks  PLANNED INTERVENTIONS: 97164- PT Re-evaluation, 97750- Physical Performance Testing, 97110-Therapeutic exercises, 97530- Therapeutic activity, 97112- Neuromuscular re-education, 97535- Self Care, 16109- Manual therapy, [phone]- Gait training, Patient/Family education, Balance training, Stair training, Joint mobilization, Visual/preceptual remediation/compensation, DME instructions, Cryotherapy, and Moist heat  PLAN FOR NEXT SESSION: Continue with skilled PT   Kristine Phalen, PT 12/02/2023, 10:12 AM

## 2023-12-02 NOTE — Therapy (Signed)
 OUTPATIENT OCCUPATIONAL THERAPY NEURO TREATMENT  Patient Name: Alan Wise MRN: [id number] DOB:1970/08/02, 53 y.o., male Today's Date: 12/02/2023  PCP: Carollynn Cirri, NP REFERRING PROVIDER: Celia Coles, MD  END OF SESSION:  OT End of Session - 12/02/23 1019     Visit Number 12    Number of Visits 16    Date for OT Re-Evaluation 12/18/23    Authorization Type [employer] 2025 OOPM MET    Authorization Time Period Covered 100% VL:MN Auth Not Reqd    OT Start Time 1018    OT Stop Time 1100    OT Time Calculation (min) 42 min    Activity Tolerance Patient tolerated treatment well    Behavior During Therapy WFL for tasks assessed/performed            Past Medical History:  Diagnosis Date   Allergy    Complication of anesthesia    pt reports he is starting to have more difficulty arousing after surgery   Diabetes mellitus (HCC)    GERD (gastroesophageal reflux disease)    Headache    History of kidney stones    Hyperlipidemia    Renal disorder    kidney stones   Past Surgical History:  Procedure Laterality Date   APPLICATION OF CRANIAL NAVIGATION Right 01/17/2021   Procedure: APPLICATION OF CRANIAL NAVIGATION;  Surgeon: Van Gelinas, MD;  Location: [facility] OR;  Service: Neurosurgery;  Laterality: Right;   COLONOSCOPY  09/03/1994   COLONOSCOPY WITH PROPOFOL  N/A 01/07/2023   Procedure: COLONOSCOPY WITH PROPOFOL ;  Surgeon: Marnee Sink, MD;  Location: [facility] ENDOSCOPY;  Service: Endoscopy;  Laterality: N/A;  NOT TOO EARLY   CYSTOSCOPY WITH STENT PLACEMENT Right 01/25/2016   Procedure: CYSTOSCOPY WITH STENT PLACEMENT;  Surgeon: Bart Born, MD;  Location: [facility] ORS;  Service: Urology;  Laterality: Right;   FRAMELESS  BIOPSY WITH BRAINLAB Right 01/17/2021   Procedure: RIGHT STEREOTACTIC BIOPSY OF INSULAR LESION;  Surgeon: Van Gelinas, MD;  Location: [facility] OR;  Service: Neurosurgery;  Laterality: Right;   KNEE ARTHROSCOPY W/ MENISCAL REPAIR Right 06/23/1988    POLYPECTOMY  01/07/2023   Procedure: POLYPECTOMY;  Surgeon: Marnee Sink, MD;  Location: [facility] ENDOSCOPY;  Service: Endoscopy;;   removal of birthmark  06/08/2013   SKIN CANCER EXCISION  06/23/2012   TYMPANOSTOMY TUBE PLACEMENT  08/06/1978   URETEROSCOPY WITH HOLMIUM LASER LITHOTRIPSY Right 01/25/2016   Procedure: URETEROSCOPY WITH HOLMIUM LASER LITHOTRIPSY;  Surgeon: Bart Born, MD;  Location: [facility] ORS;  Service: Urology;  Laterality: Right;   URETHRAL STRICTURE DILATATION     visual inspection of vocal cord     1973   WISDOM TOOTH EXTRACTION     Patient Active Problem List   Diagnosis Date Noted   Gait disorder 09/14/2023   Seizure disorder (HCC) 09/10/2023   History of CVA (cerebrovascular accident) 09/09/2023   Essential hypertension 09/09/2023   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  Rationale for Evaluation and Treatment: Rehabilitation  SUBJECTIVE:   SUBJECTIVE STATEMENT:  Pt's mother reports they got pt's wife and children off on vacation today.  No changes to meds, falls or pain reported.  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  Presented to the hospital 09/14/2023 with worsening gait left-sided weakness/aphasia and new onset dysphagia.  Of note, he was recently hospitalized from 3-19 to 3-21 with new CVA and breakthrough seizures.  In the ED he was started on IV fluids and MRI of the brain showed new  punctate focus of restricted diffusion in the anterior insular cortex compatible with new acute/subacute infarct, stable 8 mm acute/subacute infarct involving the right cerebral peduncle, stable restricted diffusion in the anterior frontal lobe and along the atrium of the right lateral ventricle consistent with treated tumor, improved diffusion changes within the genu of the corpus callosum suggesting these were ischemic, stable chronic encephalomalacia of the right frontal operculum, and stable periventricular T2 hyperintensities in the left hemisphere likely reflects the sequela of chronic microvascular ischemia. He was started on Eliquis , AEDs were adjusted and admitted with evaluation by neurology and Dr. Mark Sil with neuro-oncology, because of recent decline determined to be multifocal secondary to radiation necrosis, breakthrough seizures and multiple recent strokes.  Hospitalization complicated by dysphagia and GI determined to be neurologic.  Last known seizure 09/16/23, with subsequent increase nightly Lamictal .  Therapy evaluations completed due to patient decreased functional mobility was admitted for a comprehensive rehab program.  PRECAUTIONS: Fall  WEIGHT BEARING RESTRICTIONS: No  PAIN:  Are you having pain? No  FALLS: Has patient fallen in last 6 months? Yes. Number of falls 3 - trying to put pants on, ladder before stroke, carrying cat litter and lost balance on steps  LIVING ENVIRONMENT:  Home Living/Prior Functioning Home Living: Private residence Living Arrangements: Spouse - [name], Children - daughter [name], son 5yo Available Help at Discharge: Family, Available 24 hours/day Type of Home: House Home Access: Stairs to enter (stone steps) Entrance Stairs-Number of Steps: 4 Entrance Stairs-Rails: Right, Left Home Layout: Two level, Able to live on main level with bedroom/bathroom (can live on main level) Alternate Level Stairs-Number of Steps: flight of steps to 2nd floor  bedroom Alternate Level Stairs-Rails:  (L for first 5 and R on second 5) Bathroom Shower/Tub: Psychologist, counselling, Sport and exercise psychologist: Standard Bathroom Accessibility: Yes IADL History Homemaking Responsibilities: No Prior Function/Level of Independence: Independent with basic ADLs, Independent with transfers, Independent with gait, Independent with homemaking with ambulation Able to Take Stairs?: Yes Driving: No (not driving for several years due to seizures)  PLOF: Independent with basic ADLs and Independent with household mobility without device Prior interests: Wrestled, ball room dance, soccer, professor of mathematics at [employer]  PATIENT GOALS: Pt reported he needs to work on L side awareness, vision on left side, strength and putting jacket on.  OBJECTIVE:  Note: Objective measures were completed at Evaluation unless otherwise noted.  HAND DOMINANCE: Left  ADLs: Overall ADLs: Min assist to Mod Ind Transfers/ambulation related to ADLs: Mod Ind Eating: Pt switched to R hand for eating, wife helps cut food Grooming: Wife used clippers recently UB Dressing: Reports he struggles with button up shirt/jacket,  LB Dressing: Pt spouse report he fell with LB dressing in standing, wife confirms he can tie shoelaces Toileting: Ind Bathing: Mod I Tub Shower transfers: walk in shower with built in bench with B showerheads Equipment: none  IADLs: Shopping: wife Light housekeeping: wife Meal Prep: wife Community mobility: Supervision Medication management: Wife assists Financial management: Wife Handwriting: Increased time - due to aphasia (June 2024  aphasia, speech, reading)  MOBILITY STATUS: Hx of falls  POSTURE COMMENTS:  rounded shoulders and forward head Sitting balance: WFL  ACTIVITY TOLERANCE: Activity tolerance: Fair  FUNCTIONAL OUTCOME MEASURES: TBA  UPPER EXTREMITY ROM:    Active ROM Right eval Left eval  Shoulder flexion 120 90  Shoulder abduction    Shoulder  adduction    Shoulder extension    Shoulder internal rotation    Shoulder external rotation    Elbow flexion WNL WFL  Elbow extension WNL WFL  Wrist flexion  Sight limits  Wrist extension    Wrist ulnar deviation    Wrist radial deviation    Wrist pronation    Wrist supination    (Blank rows = not tested)  UPPER EXTREMITY MMT:     MMT Right eval Left eval  Shoulder flexion 4 3-  Shoulder abduction    Shoulder adduction    Shoulder extension    Shoulder internal rotation    Shoulder external rotation    Middle trapezius    Lower trapezius    Elbow flexion 5 4+  Elbow extension 4+ 4  Wrist flexion    Wrist extension    Wrist ulnar deviation    Wrist radial deviation    Wrist pronation    Wrist supination    (Blank rows = not tested)  HAND FUNCTION: Grip strength: Right: 53.5, 57.5, 53.3  lbs; Left: 42.9, 51.8, 41.8  lbs Right: 54.8 lbs Left: 45.5 lbs  COORDINATION: 9 Hole Peg test: Right: 41.24  sec; Left: 1:04.00 sec  SENSATION: Patient initially reported normal but then noted he may have some limtations ie) does not know   EDEMA: NA  MUSCLE TONE: Some changes ie) not as smooth with movements and coordination  COGNITION: Overall cognitive status: Impaired/Aphasia impacts cognition Hospital: [facility] Summary Score: 12   VISION: Subjective report: January - vision was decreasing Baseline vision: Wears glasses for reading only about 1 year Visual history: changes were happening perhaps a precursor to CVA  MR review: Vision Baseline Vision/History: 1 Wears glasses Ability to See in Adequate Light: 1 Impaired Patient Visual Report: Peripheral vision impairment Vision Assessment?: Wears glasses for reading Eye Alignment: Within Functional Limits Alignment/Gaze Preference: Within Defined Limits Visual Fields: Left homonymous hemianopsia Additional Comments: L peripheral vision decr since January  VISION ASSESSMENT: Tracking/Visual pursuits: Impaired - pt has  trouble with vision on L side  Patient has difficulty with following activities due to following visual impairments: walks into walls, chairs etc, ie) doesn't always scan especially if he is tired, agitated, etc  10/22/23 -  R side peripheral vision: approx. 70* L side peripheral vision: visual field cut, unable to see marker until midline.  Pt reported difficulty seeing objects on L side.  PERCEPTION: Not tested  PRAXIS: Not tested  TODAY'S TREATMENT :    Self Care:  Pt/mother assisted with exploring options to help with understanding of modifications, strategies and/or visual cues to increase safety and independence with IADLs s/p prior history of problems with cleaning activities.  Explore concept of cleaning caddy with specific material for task, simplifying labels and setting up success with tasks as he has been banned from tasks for the time being.  Pt able to read 5/6 items - window cleaner, toilet cleaner, dish soap, lawn mower gas, shaving cream but not bathtub scrub.  Mother explored option of 'name brands' as more familiar to patient also ie) Windex verus window cleaner, which is an acceptable option too.  Pt able to answer direct questions with the visual cues ie) what do you need to wash the windows ie) window cleaner, or what do you need to clean the toilets?  However, pt had more difficulty coming up with correct answer for more deductive reasoning scenarios ie) the dog has licked the window and it's dirty - What do I need? or the grass is too long and needs to be cut.  Pt frequently stating I can't and encouraged to instead seek help, realize task may be harder but he can try with appropriate support and that we are practicing to help him be able to do more.    PATIENT EDUCATION: Education details: Self Care r/t IADLs Person educated: Patient and  Mother Education method: Explanation, Demonstration, Tactile cues, and Verbal cues Education comprehension: verbalized understanding, returned demonstration, verbal cues required, tactile cues required, and needs further education  HOME EXERCISE PROGRAM: 10/20/2023: scanning activities; vision strategies 10/22/2023: PUTTY Access Code: [email or web address]  11/18/2023: dowel HEP 11/23/23: updated putty HEP - same access code  GOALS: Goals reviewed with patient? Yes  SHORT TERM GOALS: Target date: 11/20/23  Patient will demonstrate initial UE HEP with 25% verbal cues or less for proper execution (d/t B shoulder limitations and L sided weakness s/p CVA). Baseline: New to outpt OT Goal status: MET   2.  Patient will demonstrate at least 5+ lbs improvement for BUE grip strength as needed to open jars and other containers. Baseline: Right: 54.8 lbs Left: 45.5 lbs 11/20/2023: 58.6 lbs Left: 49.3 lbs Goal status: IN PROGRESS   3.  Patient will demo improved FM coordination as evidenced by completing nine-hole peg 5+ seconds faster with use of BUE.  Baseline: 9 Hole Peg test: Right: 41.24  sec; Left: 1:04.00 sec 11/20/2023: Right: 36 sec; Left: 47 sec Goal status: MET   4.  Patient will independently recall at least 2 compensatory strategies for visual impairment without cueing. Baseline: New to outpt OT - bumps into furniture Goal status: IN Progress 6/2 - still bumps things on L side   5. Pt will verbalize understanding of modifications, strategies and/or equipment PRN with visual cues as needed to increase safety and independence with ADLs and IADLs (I.e. with decreased fall risk). Baseline: New to outpt OT - fell donning pants Goal Status: MET - Pt reports independence   LONG TERM GOALS: Target date: 12/18/23   Patient will demonstrate updated UE HEP with visual handouts only for proper execution.  Baseline: New to outpt OT  Goal status: IN Progress   2.  Patient will return demonstration of visual  adaptation use and verbalize understanding of how to obtain item(s) if desired.  Baseline: New to outpt OT Goal status: IN Progress   3.  Patient will demo improved FM coordination as evidenced by completing nine-hole peg  with use of BUE as close to 30 seconds as possible.  Baseline: 9 Hole Peg test: Right: 41.24  sec; Left: 1:04.00 sec Goal status: IN Progress 11/20/23: Right: 36 sec; Left: 47 sec   4.  Pt will write/print his name/initials. Baseline: difficult due to aphasia by report Goal status: MET   5.  Pt will report increased ease with donning all clothing items - shirt, socks/shoes without assistance of spouse Baseline: occasional assistance of spouse Goal status: MET  ASSESSMENT:  CLINICAL IMPRESSION: Patient is a 53 y.o. male who was seen today for occupational therapy treatment for safety education s/p CVA. Pt presents difficulty with understanding reason for modifications and practicing certain tasks such as safety options for IADLS etc.  He also needs extra cues - verbal, tactile and visual to improve follow through with different/novel activities or situations.  Pt will benefit from continued skilled OT services in the outpatient setting to work on impairments as noted at evaluation to help pt return to [facility] as able.    PERFORMANCE DEFICITS: in functional skills including ADLs, IADLs, coordination, dexterity, proprioception, sensation, tone, ROM, strength, pain, fascial restrictions, muscle spasms, flexibility, Fine motor control, Gross motor control, mobility, balance, body mechanics, endurance, decreased knowledge of precautions, decreased knowledge of use of DME, vision, and UE functional use, cognitive skills including attention, emotional, energy/drive, learn, memory, perception, problem solving, safety awareness, sequencing, temperament/personality, thought, and understand, and psychosocial skills including coping strategies, environmental adaptation, habits, interpersonal  interactions, and routines and behaviors.   IMPAIRMENTS: are limiting patient from ADLs, IADLs, rest and sleep, education, work, play, leisure, and social participation.   CO-MORBIDITIES: has co-morbidities such as h/o frontal oligodendroglioma  that affects occupational performance. Patient will benefit from skilled OT to address above impairments and improve overall function.  REHAB POTENTIAL: Good  PLAN:  OT FREQUENCY: 1-2x/week  OT DURATION: 8 weeks  PLANNED INTERVENTIONS: 97535 self care/ADL training, 13244 therapeutic exercise, 97530 therapeutic activity, 97112 neuromuscular re-education, 97140 manual therapy, balance training, functional mobility training, visual/perceptual remediation/compensation, psychosocial skills training, energy conservation, coping strategies training, patient/family education, and DME and/or AE instructions  RECOMMENDED OTHER SERVICES: NA - other therapies already scheduled at this time  CONSULTED AND AGREED WITH PLAN OF CARE: Patient and family member/caregiver  PLAN FOR NEXT SESSION:   Daily/weekly picture schedule/cleaning caddies  Hallway balloon/scarf with sign recognition/response  Visual scanning activities - encourage L side awareness (balloon + scanning) Review theraputty HEP HEP update: lower-level coordination (2-4 exercises) HEP schedule Visual scan to L with putty   Zora Hires, OT 12/02/2023, 10:22 AM

## 2023-12-02 NOTE — Therapy (Signed)
 OUTPATIENT SPEECH LANGUAGE PATHOLOGY APHASIA TREATMENT   Patient Name: Alan Wise MRN: [id number] DOB:1970-07-19, 53 y.o., male Today's Date: 12/02/2023  PCP: Carollynn Cirri, NP REFERRING PROVIDER: Celia Coles, MD  END OF SESSION:  End of Session - 12/02/23 1040     Visit Number 8    Number of Visits 17    Date for SLP Re-Evaluation 12/16/23    Authorization Type [employer]    Progress Note Due on Visit 10    SLP Start Time 1103    SLP Stop Time  1144    SLP Time Calculation (min) 41 min    Activity Tolerance Patient tolerated treatment well             Past Medical History:  Diagnosis Date   Allergy    Complication of anesthesia    pt reports he is starting to have more difficulty arousing after surgery   Diabetes mellitus (HCC)    GERD (gastroesophageal reflux disease)    Headache    History of kidney stones    Hyperlipidemia    Renal disorder    kidney stones   Past Surgical History:  Procedure Laterality Date   APPLICATION OF CRANIAL NAVIGATION Right 01/17/2021   Procedure: APPLICATION OF CRANIAL NAVIGATION;  Surgeon: Van Gelinas, MD;  Location: [facility] OR;  Service: Neurosurgery;  Laterality: Right;   COLONOSCOPY  09/03/1994   COLONOSCOPY WITH PROPOFOL  N/A 01/07/2023   Procedure: COLONOSCOPY WITH PROPOFOL ;  Surgeon: Marnee Sink, MD;  Location: [facility] ENDOSCOPY;  Service: Endoscopy;  Laterality: N/A;  NOT TOO EARLY   CYSTOSCOPY WITH STENT PLACEMENT Right 01/25/2016   Procedure: CYSTOSCOPY WITH STENT PLACEMENT;  Surgeon: Bart Born, MD;  Location: [facility] ORS;  Service: Urology;  Laterality: Right;   FRAMELESS  BIOPSY WITH BRAINLAB Right 01/17/2021   Procedure: RIGHT STEREOTACTIC BIOPSY OF INSULAR LESION;  Surgeon: Van Gelinas, MD;  Location: [facility] OR;  Service: Neurosurgery;  Laterality: Right;   KNEE ARTHROSCOPY W/ MENISCAL REPAIR Right 06/23/1988   POLYPECTOMY  01/07/2023   Procedure: POLYPECTOMY;  Surgeon: Marnee Sink, MD;   Location: [facility] ENDOSCOPY;  Service: Endoscopy;;   removal of birthmark  06/08/2013   SKIN CANCER EXCISION  06/23/2012   TYMPANOSTOMY TUBE PLACEMENT  08/06/1978   URETEROSCOPY WITH HOLMIUM LASER LITHOTRIPSY Right 01/25/2016   Procedure: URETEROSCOPY WITH HOLMIUM LASER LITHOTRIPSY;  Surgeon: Bart Born, MD;  Location: [facility] ORS;  Service: Urology;  Laterality: Right;   URETHRAL STRICTURE DILATATION     visual inspection of vocal cord     1973   WISDOM TOOTH EXTRACTION     Patient Active Problem List   Diagnosis Date Noted   Gait disorder 09/14/2023   Seizure disorder (HCC) 09/10/2023   History of CVA (cerebrovascular accident) 09/09/2023   Essential hypertension 09/09/2023   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   PERTINENT HISTORY: Pt is a  53 y.o. year old male  who  has a past medical history of Allergy, Complication of anesthesia, Diabetes mellitus (HCC), GERD (  gastroesophageal reflux disease), Headache, History of kidney stones, Hyperlipidemia, and Renal disorder.  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  They presented to the hospital 09/14/23 with worsening gait, left-sided weakness, and new onset dysphagia.  Of note, he was recently hospitalized at home announce from 3-19 to 3-21 with new CVA and breakthrough seizures.  In the ER, he was started on IV fluids and MRI brain showed new punctate focus of restricted  diffusion in the anterior insular cortex compatible with a new acute/subacute infarct, stable 8 mm acute/subacute infarct involving the right cerebral peduncle, stable restricted diffusion in the anterior right frontal lobe and along the atrium of the right lateral ventricle consistent with treated tumor. Known to use from Speech Evaluation prior to CVA 09/09/23, however no therapy started due to hospitalization 09/09/23.   PAIN:  Are you having pain? No                                                                                                                            TREATMENT DATE:   12/02/23: They forgot his communication book today. Pt's family headed to [place] for vacation today - Targeted  words re:  [place]. With frequent max semantic, fill in the blank and written cues, Alan Wise generated 11 [place] words. Multimodal communication - drawing - targeted with frequent max copy cues, Alan Wise drew a train and Alan  Wise to A with communication of places his family is going to. Instructions to draw resulted in pt trying to write the words - he benefits from demonstration of use of drawing to augment communication. They report aphasia homework provided from prior sessions is too hard and frustrating. Provided new homework packet and completed 1st 3 items with pt with rare min A - he feels he can complete this packet at home.   11/25/23: He has not brought in his communication book - requested again that hey bring in the book or at least the pages. Targeted word finding and multimodal communication - in category of yard work, he generated 6 words with gesture and 1st letter cue - with instruction to draw, he successfully drew a bed and I was able to give choice of flower or vegetable bed to verbalize he was meaning a vegetable bed. In written expression at word level with categories of movies - he successfully wrote and named 2 movies with mod I. With semantic cues and gesture cues he named/wrote another  movie. With extended time and occasional min questioning cues to communicated that his daughter prefers theater/broadway it's higher (than a movie) you sit and they come on stage. Continue to encourage multimodal communication.   11/20/23: Introduced alphabet board for use of self-cueing for anomia, as pt previously showed benefit from first level cue. 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 ABC board effective in x3 opportunities, SLP provision of first letter effective in x6 opportunities, x2 instances of using ABC tiles to generate target word (population, algebra). Pt demonstrating good responsiveness to letter cueing. Occasional benefit from phonemic cues, as needed. Reduced benefit from semantic cues this date.   11/06/23: Reviewed purpose of communication book, target employment for discussion of every day subjects. Using written aid, pt does successfully communication response to SLP questions in 4/5 trials though evidencing vague language, empty speech. Target generative naming with personally relevant topics. 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 X1 attempts of pt providing description of anomic target. Demonstration of phonemic cueing provided to aid in anomia recovery. Education provided to Alan Wise regarding benefit of receiving cues from communication partners to aid in recovery.   11/04/23: They left communication book at home - will bring it next session. Targeted simple sentence generation with fill in the blank verb (s-o-v) in personally relevant sentences. Alan Wise required frequent written choice of 2 to complete 8/10 sentences.  Targeted divergent naming in personally relevant categories (baking, items in his garage, his degrees and schools) - He required visual organization (topic, then numbers) and frequent 1st letter cues, choice of 2 to name 5-7 items in each category.   10/28/23: Targeted naming with Star Wars words - he generated 15 with frequent mod semantic and 1st letter cues. Targeted written expression writing the words with frequent mod A for error awareness and self correction. Targeted verbal compensations for aphasia generating 3-4 descriptions of 2 fast food restaurants ([employer] and [employer]) with frequent mod to max verbal, visual cues (looking at the menu) to generate descriptions as well as his orders - consistent extended time;  10/26/23: Alan Wise enters with communication notebook he and his wife have generated. He is requesting he words be moved to the right of the page rather than centered. Attempted VNEST Music therapist) - Alan Wise required max A (written choice of 2) to generate 3 subjects and verbs for the 1 verb (measure) as well as max A written or verbal choice of 2 to answer wh questions to generate complex sentence with 1 set of subject/verb. Targeted verbal compensations for aphasia generated 3-4 descriptions of family members. With visual organizer of Name with 1-4 numbered below to support attention to task and with usual mod questioning cues and verbal cues, he generated 4 descriptions of his daughter. Targeted divergent naming in personally relevant category (ballroom dancing) with extended time and occasional min 1st letter or semantic cue, Anav named 10 ballroom dances.   10/21/23: Evaluation completed - see above. Alan Wise brought communication notebook we started to the hospital however it was lost. Reviewed what to include in communication support book - she agrees to re-print the pages, adding restaurant orders, bullet points about friends and family, places they  frequent. Cued them to think about people, places, items the encounter daily, weekly and monthly - generated ideas and words for book - See patient instructions. Generated home practice of naming items around the home, children's activities, school subjects they study, upcoming activities. Instructed them to also practice the words in his communication support book as these are the most personally relevant. In  divergent naming task (school subjects) Alan Wise benefits from Topic and numbered visual cues to remain on task and avoid tangential  speech/language. Spouse endorses they have a white board at home and will use this strategy during naming tasks at home.     PATIENT EDUCATION: Education details: See Treatment; See Patient Instructions, compensations for aphasia, communication supports, language activities to do at home Person educated: Patient and Spouse Education method: Explanation, Demonstration, Verbal cues, and Handouts Education comprehension: verbalized understanding, returned demonstration, verbal cues required, and needs further education   GOALS: Goals reviewed with patient? Yes - 11/18/23 - target date Pt will use multimodal communication to communicate family names and biographical information with rare min A Baseline: Goal status: MET   2.  Pt and spouse will generate 4 personally relevant places and orders to add to communication support book with occasional min A Baseline:  Goal status: MET   3.  Pt will use multimodal communication to name 8 items in a personally relevant category with occasional min A Baseline:  Goal status: MET   4.  Pt will use external aids to attend to the left when reading sentences  Baseline:  Goal status: MET   5.  Pt will use verbal compensations for aphasia in structured tasks with occasional min A Baseline:  Goal status: PARTIALLY MET       LONG TERM GOALS: Target date: 12/16/23   Pt will use communication support notebook and  scripting to place order at restaurant and to make 2 business calls Baseline:  Goal status: ONGOING   2.  Pt will use multimodal communication to communicate preferences in simple conversation Baseline:  Goal status: ONGOING   3.  Pt and spouse will carryover 3 compensatory strategies to support pt's auditory comprehension Baseline:  Goal status: ONGOING   4.  Pt will generate moderately complex sentences in structured task such as VNeST with usual min A Baseline:  Goal status:ONGOING   5.  Pt and spouse will carryover 1-2 language enhancing activities at home daily 5/7 days Baseline:  Goal status: ONGOING   6.  Pt and spouse will add 6 new topics/words to communication support notebook Baseline:  Goal status: ONGOING   ASSESSMENT:  CLINICAL IMPRESSION: Patient is a 53 y.o. male who was seen today for moderate aphasia c/b empty, vague language, halting for word finding difficulties., reduced auditory comprehension for mildly complex information.  We initiated communication support book however he has not brought this in past 4 sessions. Improvements in verbal compensations for aphasia and introduced drawing for multimodal communication. Language continues to be vague and empty without awareness. Aphasia continues to affect communication of wants/needs resulting in frustration of pt and anger toward spouse when she doesn't understand or tries to cue him.   I recommend skilled ST to maximize communication for safety, to reduce caregiver burden and improve QOL.   OBJECTIVE IMPAIRMENTS: include aphasia. These impairments are limiting patient from return to work, managing medications, managing appointments, managing finances, ADLs/IADLs, and effectively communicating at home and in community. Factors affecting potential to achieve goals and functional outcome are co-morbidities and severity of impairments. Patient will benefit from skilled SLP services to address above impairments and  improve overall function.  REHAB POTENTIAL: Good  PLAN:  SLP FREQUENCY: 2x/week  SLP DURATION: 8 weeks  PLANNED INTERVENTIONS: Language facilitation, Environmental controls, Cueing hierachy, Cognitive reorganization, Internal/external aids, Functional tasks, Multimodal communication approach, SLP instruction and feedback, Compensatory strategies, Patient/family education, [phone] Treatment of speech (30 or 45 min) , and [id number]- Speech Eval Sound Prod, Artic, Phon, Eval Compre, Express    Cesilia Shinn, Dareen Ebbing, CCC-SLP  12/02/2023, 3:06 PM

## 2023-12-03 ENCOUNTER — Ambulatory Visit (HOSPITAL_COMMUNITY)
Admission: RE | Admit: 2023-12-03 | Discharge: 2023-12-03 | Disposition: A | Discharge status: Home / Self Care | Source: Ambulatory Visit | Attending: Internal Medicine | Admitting: Internal Medicine

## 2023-12-03 DIAGNOSIS — R569 Unspecified convulsions: Secondary | ICD-10-CM | POA: Insufficient documentation

## 2023-12-03 DIAGNOSIS — I6789 Other cerebrovascular disease: Secondary | ICD-10-CM | POA: Diagnosis present

## 2023-12-03 DIAGNOSIS — Y842 Radiological procedure and radiotherapy as the cause of abnormal reaction of the patient, or of later complication, without mention of misadventure at the time of the procedure: Secondary | ICD-10-CM | POA: Diagnosis not present

## 2023-12-03 DIAGNOSIS — I639 Cerebral infarction, unspecified: Secondary | ICD-10-CM | POA: Insufficient documentation

## 2023-12-03 DIAGNOSIS — C710 Malignant neoplasm of cerebrum, except lobes and ventricles: Secondary | ICD-10-CM | POA: Diagnosis present

## 2023-12-03 MED ORDER — GADOBUTROL 1 MMOL/ML IV SOLN
7.0000 mL | Freq: Once | INTRAVENOUS | Status: AC | PRN
  Administered 2023-12-03: 7 mL via INTRAVENOUS

## 2023-12-04 ENCOUNTER — Ambulatory Visit: Admitting: Speech Pathology

## 2023-12-04 DIAGNOSIS — R4701 Aphasia: Secondary | ICD-10-CM

## 2023-12-04 DIAGNOSIS — R41841 Cognitive communication deficit: Secondary | ICD-10-CM

## 2023-12-04 DIAGNOSIS — M6281 Muscle weakness (generalized): Secondary | ICD-10-CM | POA: Diagnosis not present

## 2023-12-04 NOTE — Therapy (Signed)
 OUTPATIENT SPEECH LANGUAGE PATHOLOGY APHASIA TREATMENT   Patient Name: Alan Wise MRN: [id number] DOB:25-Dec-1970, 53 y.o., male Today's Date: 12/04/2023  PCP: Alan Cirri, NP REFERRING PROVIDER: Celia Coles, MD  END OF SESSION:  End of Session - 12/04/23 1105     Visit Number 9    Number of Visits 17    Date for SLP Re-Evaluation 12/16/23    Authorization Type [employer]    Progress Note Due on Visit 10    SLP Start Time 1105    SLP Stop Time  1145    SLP Time Calculation (min) 40 min    Activity Tolerance Patient tolerated treatment well          Past Medical History:  Diagnosis Date   Allergy    Complication of anesthesia    pt reports he is starting to have more difficulty arousing after surgery   Diabetes mellitus (HCC)    GERD (gastroesophageal reflux disease)    Headache    History of kidney stones    Hyperlipidemia    Renal disorder    kidney stones   Past Surgical History:  Procedure Laterality Date   APPLICATION OF CRANIAL NAVIGATION Right 01/17/2021   Procedure: APPLICATION OF CRANIAL NAVIGATION;  Surgeon: Van Gelinas, MD;  Location: [facility] OR;  Service: Neurosurgery;  Laterality: Right;   COLONOSCOPY  09/03/1994   COLONOSCOPY WITH PROPOFOL  N/A 01/07/2023   Procedure: COLONOSCOPY WITH PROPOFOL ;  Surgeon: Marnee Sink, MD;  Location: [facility] ENDOSCOPY;  Service: Endoscopy;  Laterality: N/A;  NOT TOO EARLY   CYSTOSCOPY WITH STENT PLACEMENT Right 01/25/2016   Procedure: CYSTOSCOPY WITH STENT PLACEMENT;  Surgeon: Bart Born, MD;  Location: [facility] ORS;  Service: Urology;  Laterality: Right;   FRAMELESS  BIOPSY WITH BRAINLAB Right 01/17/2021   Procedure: RIGHT STEREOTACTIC BIOPSY OF INSULAR LESION;  Surgeon: Van Gelinas, MD;  Location: [facility] OR;  Service: Neurosurgery;  Laterality: Right;   KNEE ARTHROSCOPY W/ MENISCAL REPAIR Right 06/23/1988   POLYPECTOMY  01/07/2023   Procedure: POLYPECTOMY;  Surgeon: Marnee Sink, MD;   Location: [facility] ENDOSCOPY;  Service: Endoscopy;;   removal of birthmark  06/08/2013   SKIN CANCER EXCISION  06/23/2012   TYMPANOSTOMY TUBE PLACEMENT  08/06/1978   URETEROSCOPY WITH HOLMIUM LASER LITHOTRIPSY Right 01/25/2016   Procedure: URETEROSCOPY WITH HOLMIUM LASER LITHOTRIPSY;  Surgeon: Bart Born, MD;  Location: [facility] ORS;  Service: Urology;  Laterality: Right;   URETHRAL STRICTURE DILATATION     visual inspection of vocal cord     1973   WISDOM TOOTH EXTRACTION     Patient Active Problem List   Diagnosis Date Noted   Gait disorder 09/14/2023   Seizure disorder (HCC) 09/10/2023   History of CVA (cerebrovascular accident) 09/09/2023   Essential hypertension 09/09/2023   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   PERTINENT HISTORY: Pt is a  53 y.o. year old male  who  has a past medical history of Allergy, Complication of anesthesia, Diabetes mellitus (HCC), GERD (gastroesophageal reflux disease),  Headache, History of kidney stones, Hyperlipidemia, and Renal disorder.  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  They presented to the hospital 09/14/23 with worsening gait, left-sided weakness, and new onset dysphagia.  Of note, he was recently hospitalized at home announce from 3-19 to 3-21 with new CVA and breakthrough seizures.  In the ER, he was started on IV fluids and MRI brain showed new punctate focus of restricted  diffusion in the anterior insular cortex compatible with a new acute/subacute infarct, stable 8 mm acute/subacute infarct involving the right cerebral peduncle, stable restricted diffusion in the anterior right frontal lobe and along the atrium of the right lateral ventricle consistent with treated tumor. Known to use from Speech Evaluation prior to CVA 09/09/23, however no therapy started due to hospitalization 09/09/23.   PAIN:  Are you having pain? No                                                                                                                            TREATMENT DATE:   12/04/23: Pt attends with mother. Using picture stimuli, pt with 100% accuracy creating simple sentence to describe (e.g., she is dancing). Used verb to then generate salient and personally relevant sentences. Pt able to discuss his previous experiences dancing, baking, cooking with usual anomia and vague language with reduced content. Max-A moderately effective for generating content words. Pt with increased difficulty speaking about others (e.g. what his wife would cook). Provided feedback for use of supportive communication techniques of confirmation, provision of options and encouraged pt to provide information as able, tell communication partner when they are wrong-- today he repeatedly said that will work with SLP needing to prompt pt to continue working towards target.    12/02/23: They forgot his communication book today. Pt's family headed to [place] for vacation today - Targeted  words re:  [place]. With frequent max semantic, fill in the blank and written cues, [name] generated 11 [place] words. Multimodal communication - drawing - targeted with frequent max copy cues, [name] drew a train and [place]  monument to A with communication of places his family is going to. Instructions to draw resulted in pt trying to write the words - he benefits from demonstration of use of drawing to augment communication. They report  aphasia homework provided from prior sessions is too hard and frustrating. Provided new homework packet and completed 1st 3 items with pt with rare min A - he feels he can complete this packet at home.   11/25/23: He has not brought in his communication book - requested again that hey bring in the book or at least the pages. Targeted word finding and multimodal communication - in category of yard work, he generated 6 words with gesture and 1st letter cue - with instruction to draw, he successfully drew a bed and I was able to give choice of flower or vegetable bed to verbalize he was meaning a vegetable bed. In written  expression at word level with categories of movies - he successfully wrote and named 2 movies with mod I. With semantic cues and gesture cues he named/wrote another movie. With extended time and occasional min questioning cues to communicated that his daughter prefers theater/broadway it's higher (than a movie) you sit and they come on stage. Continue to encourage multimodal communication.   11/20/23: Introduced alphabet board for use of self-cueing for anomia, as pt previously showed benefit from first level cue. 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 ABC board effective in x3 opportunities, SLP provision of first letter effective in x6 opportunities, x2 instances of using ABC tiles to generate target word (population, algebra). Pt demonstrating good responsiveness to letter cueing. Occasional benefit from phonemic cues, as needed. Reduced benefit from semantic cues this date.   11/06/23: Reviewed purpose of communication book, target employment for discussion of every day subjects. Using written aid, pt does successfully communication response to  SLP questions in 4/5 trials though evidencing vague language, empty speech. Target generative naming with personally relevant topics. Nevin names x6 dances (previously addressed) with min-A cues, x4 movies in which he played as an extra with usual initial letter cues and description. X1 attempts of pt providing description of anomic target. Demonstration of phonemic cueing provided to aid in anomia recovery. Education provided to Gird[place]ree regarding benefit of receiving cues from communication partners to aid in recovery.   11/04/23: They left communication book at home - will bring it next session. Targeted simple sentence generation with fill in the blank verb (s-o-v) in personally relevant sentences. Areon required frequent written choice of 2 to complete 8/10 sentences. Targeted divergent naming in personally relevant categories (baking, items in his garage, his degrees and schools) - He required visual organization (topic, then numbers) and frequent 1st letter cues, choice of 2 to name 5-7 items in each category.   10/28/23: Targeted naming with Star Wars words - he generated 15 with frequent mod semantic and 1st letter cues. Targeted written expression writing the words with frequent mod A for error awareness and self correction. Targeted verbal compensations for aphasia generating 3-4 descriptions of 2 fast food restaurants ([employer] and [employer]) with frequent mod to max verbal, visual cues (looking at the menu) to generate descriptions as well as his orders - consistent extended time;  10/26/23: Nohlan enters with communication notebook he and his wife have generated. He is requesting he words be moved to the right of the page rather than centered. Attempted VNEST Music therapist) - Arlyce Lambert required max A (written choice of 2) to generate 3 subjects and verbs for the 1 verb (measure) as well as max A written or verbal choice of 2 to answer wh questions to generate  complex sentence with 1 set of subject/verb. Targeted verbal compensations for aphasia generated 3-4 descriptions of family members. With visual organizer of Name with 1-4 numbered below to support attention to task and with usual mod questioning cues and verbal cues, he generated 4 descriptions of his daughter. Targeted divergent naming in personally relevant category (ballroom dancing) with extended time and occasional min 1st letter or semantic cue, Taro named 10 ballroom dances.   10/21/23: Evaluation completed - see above. [name] brought Neurosurgeon we started to  the hospital however it was lost. Reviewed what to include in communication support book - she agrees to re-print the pages, adding restaurant orders, bullet points about friends and family, places they frequent. Cued them to think about people, places, items the encounter daily, weekly and monthly - generated ideas and words for book - See patient instructions. Generated home practice of naming items around the home, children's activities, school subjects they study, upcoming activities. Instructed them to also practice the words in his communication support book as these are the most personally relevant. In  divergent naming task (school subjects) Ramzey benefits from Topic and numbered visual cues to remain on task and avoid tangential speech/language. Spouse endorses they have a white board at home and will use this strategy during naming tasks at home.     PATIENT EDUCATION: Education details: See Treatment; See Patient Instructions, compensations for aphasia, communication supports, language activities to do at home Person educated: Patient and Spouse Education method: Explanation, Demonstration, Verbal cues, and Handouts Education comprehension: verbalized understanding, returned demonstration, verbal cues required, and needs further education   GOALS: Goals reviewed with patient? Yes - 11/18/23 - target date Pt  will use multimodal communication to communicate family names and biographical information with rare min A Baseline: Goal status: MET   2.  Pt and spouse will generate 4 personally relevant places and orders to add to communication support book with occasional min A Baseline:  Goal status: MET   3.  Pt will use multimodal communication to name 8 items in a personally relevant category with occasional min A Baseline:  Goal status: MET   4.  Pt will use external aids to attend to the left when reading sentences  Baseline:  Goal status: MET   5.  Pt will use verbal compensations for aphasia in structured tasks with occasional min A Baseline:  Goal status: PARTIALLY MET       LONG TERM GOALS: Target date: 12/16/23   Pt will use communication support notebook and scripting to place order at restaurant and to make 2 business calls Baseline:  Goal status: ONGOING   2.  Pt will use multimodal communication to communicate preferences in simple conversation Baseline:  Goal status: ONGOING   3.  Pt and spouse will carryover 3 compensatory strategies to support pt's auditory comprehension Baseline:  Goal status: ONGOING   4.  Pt will generate moderately complex sentences in structured task such as VNeST with usual min A Baseline:  Goal status:ONGOING   5.  Pt and spouse will carryover 1-2 language enhancing activities at home daily 5/7 days Baseline:  Goal status: ONGOING   6.  Pt and spouse will add 6 new topics/words to communication support notebook Baseline:  Goal status: ONGOING   ASSESSMENT:  CLINICAL IMPRESSION: Patient is a 53 y.o. male who was seen today for moderate aphasia c/b empty, vague language, halting for word finding difficulties., reduced auditory comprehension for mildly complex information.  We initiated communication support book however he has not brought this in past 4 sessions. Improvements in verbal compensations for aphasia and introduced drawing for  multimodal communication. Language continues to be vague and empty without awareness. Aphasia continues to affect communication of wants/needs resulting in frustration of pt and anger toward spouse when she doesn't understand or tries to cue him.   I recommend skilled ST to maximize communication for safety, to reduce caregiver burden and improve QOL.   OBJECTIVE IMPAIRMENTS: include aphasia. These impairments are limiting patient from  return to work, managing medications, managing appointments, managing finances, ADLs/IADLs, and effectively communicating at home and in community. Factors affecting potential to achieve goals and functional outcome are co-morbidities and severity of impairments. Patient will benefit from skilled SLP services to address above impairments and improve overall function.  REHAB POTENTIAL: Good  PLAN:  SLP FREQUENCY: 2x/week  SLP DURATION: 8 weeks  PLANNED INTERVENTIONS: Language facilitation, Environmental controls, Cueing hierachy, Cognitive reorganization, Internal/external aids, Functional tasks, Multimodal communication approach, SLP instruction and feedback, Compensatory strategies, Patient/family education, [phone] Treatment of speech (30 or 45 min) , and [id number]- Speech Eval Sound Prod, Artic, Phon, Eval Compre, Express    Alston Jerry, CCC-SLP 12/04/2023, 11:48 AM

## 2023-12-05 ENCOUNTER — Encounter: Payer: Self-pay | Admitting: Internal Medicine

## 2023-12-07 ENCOUNTER — Inpatient Hospital Stay: Attending: Internal Medicine | Admitting: Internal Medicine

## 2023-12-07 VITALS — BP 144/86 | HR 78 | Temp 99.8°F | Resp 16 | Ht 68.0 in | Wt 144.9 lb

## 2023-12-07 DIAGNOSIS — I6789 Other cerebrovascular disease: Secondary | ICD-10-CM | POA: Diagnosis not present

## 2023-12-07 DIAGNOSIS — G40909 Epilepsy, unspecified, not intractable, without status epilepticus: Secondary | ICD-10-CM

## 2023-12-07 DIAGNOSIS — C710 Malignant neoplasm of cerebrum, except lobes and ventricles: Secondary | ICD-10-CM | POA: Diagnosis not present

## 2023-12-07 DIAGNOSIS — Y842 Radiological procedure and radiotherapy as the cause of abnormal reaction of the patient, or of later complication, without mention of misadventure at the time of the procedure: Secondary | ICD-10-CM

## 2023-12-07 DIAGNOSIS — C711 Malignant neoplasm of frontal lobe: Secondary | ICD-10-CM | POA: Insufficient documentation

## 2023-12-07 NOTE — Progress Notes (Signed)
 [facility] [address]  [place], [place] [address] [phone]  Interval Evaluation  Date of Service: 12/07/23 Patient Name: Alan Wise Patient MRN: [id number] Patient DOB: 09/05/1970 Provider: Mamie Searles, MD  Identifying Statement:  Alan Wise is a 53 y.o. 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 Andy Bannister; path demonstrates IDH-1 mutant oligodendroglioma   03/20/2021 - 04/30/2021 Radiation Therapy   Completes 54 Gy IMRT and Temodar  with Dr. Lurena Sally   06/02/2021 - 03/05/2022 Chemotherapy   Completes 8 cycles of adjuvant 5-day Temozolomide    02/01/2022 Progression   Progression of disease in multifocal/metastatic/lmd pattern   03/11/2022 Progression   Progression of disease confirmed after additional cycle of TMZ, 1 month follow up MRI.  LP/cytology and MRI spine mets screening unremarkable   04/10/2022 Surgery   Stereotactic biopsy of progressive R frontal lesion at [facility] with Dr. Reather Campbell.  Path demonstrates radiation necrosis   12/03/2022 Surgery   Repeat biopsy with Dr. Reather Campbell, R frontal.  Path again demonstrates necrosis and treatment effect   03/12/2023 -  Chemotherapy   Patient is on Treatment Plan : BRAIN GBM Bevacizumab  14d x 6 cycles       Biomarkers:   Interval History: Alan Wise presents today following recent admission, MRI brain.  No new or progressive changes reported.  Fortunately, there have not been additional seizures since the Lamictal  was restarted.  He is dosing aspirin  81mg  daily in place of the Eliquis .  Has URI symptoms today.   H+P (12/31/20) Patient presented to medical attention this past month, with complaint of involuntary left sided shaking.  He describes episodes, occurring several times per week, of left arm shaking, lasting up to 5 minutes; following the episodes he describes the arm  as heavy for some time before returning to normal.  He also describes episodes of sudden inability to speak, with strange movements of face and hands, lasting same amount of time.  These episodes go back ~10 years in very sporadic nature, but have become much more frequent in recent months.  He works full time as a Agricultural consultant at [employer].  These episodes have occurred at work, and have been disruptive in that environment.  He is otherwise fully functional, independent.  Medications: Current Outpatient Medications on File Prior to Visit  Medication Sig Dispense Refill   acetaminophen  (TYLENOL ) 325 MG tablet Take 2 tablets (650 mg total) by mouth every 6 (six) hours as needed for mild pain (pain score 1-3) (or Fever >/= 101).     amLODipine  (NORVASC ) 5 MG tablet TAKE 1 TABLET (5 MG TOTAL) BY MOUTH DAILY. 30 tablet 2   ASPIRIN  LOW DOSE 81 MG tablet Take 81 mg by mouth daily.     lamoTRIgine  (LAMICTAL ) 150 MG tablet Take 150 mg by mouth 2 (two) times daily.     propranolol  (INDERAL ) 20 MG tablet TAKE 1 TABLET BY MOUTH TWICE A DAY 60 tablet 1   atorvastatin  (LIPITOR) 40 MG tablet Take 1 tablet (40 mg total) by mouth daily. (Patient not taking: Reported on 11/17/2023) 30 tablet 0   cholecalciferol  (VITAMIN D3) 25 MCG (1000 UNIT) tablet Take 1 tablet (1,000 Units total) by mouth daily. (Patient not taking: Reported on 11/17/2023) 30 tablet 0   cyanocobalamin  1000 MCG tablet Take 1 tablet (1,000 mcg total) by mouth daily. (Patient not taking: Reported on 11/17/2023) 30 tablet  0   diazePAM , 15 MG Dose, 2 x 7.5 MG/0.1ML LQPK Place 15 mg into the nose once as needed for up to 1 dose (seizure). (Patient not taking: Reported on 12/07/2023) 2 each 0   divalproex  (DEPAKOTE ) 500 MG DR tablet Take 1 tablet (500 mg total) by mouth every 8 (eight) hours. (Patient not taking: Reported on 12/07/2023) 90 tablet 0   losartan  (COZAAR ) 25 MG tablet Take 1 tablet (25 mg total) by mouth daily. (Patient not taking:  Reported on 12/07/2023) 30 tablet 0   No current facility-administered medications on file prior to visit.    Allergies:  Allergies  Allergen Reactions   Contrast Media [Iodinated Contrast Media] Swelling   Past Medical History:  Past Medical History:  Diagnosis Date   Allergy    Complication of anesthesia    pt reports he is starting to have more difficulty arousing after surgery   Diabetes mellitus (HCC)    GERD (gastroesophageal reflux disease)    Headache    History of kidney stones    Hyperlipidemia    Renal disorder    kidney stones   Past Surgical History:  Past Surgical History:  Procedure Laterality Date   APPLICATION OF CRANIAL NAVIGATION Right 01/17/2021   Procedure: APPLICATION OF CRANIAL NAVIGATION;  Surgeon: Van Gelinas, MD;  Location: [facility] OR;  Service: Neurosurgery;  Laterality: Right;   COLONOSCOPY  09/03/1994   COLONOSCOPY WITH PROPOFOL  N/A 01/07/2023   Procedure: COLONOSCOPY WITH PROPOFOL ;  Surgeon: Marnee Sink, MD;  Location[facility] ENDOSCOPY;  Service: Endoscopy;  Laterality: N/A;  NOT TOO EARLY   CYSTOSCOPY WITH STENT PLACEMENT Right 01/25/2016   Procedure: CYSTOSCOPY WITH STENT PLACEMENT;  Surgeon: Bart Born, MD;  Location: [facility] ORS;  Service: Urology;  Laterality: Right;   FRAMELESS  BIOPSY WITH BRAINLAB Right 01/17/2021   Procedure: RIGHT STEREOTACTIC BIOPSY OF INSULAR LESION;  Surgeon: Van Gelinas, MD;  Location: [facility] OR;  Service: Neurosurgery;  Laterality: Right;   KNEE ARTHROSCOPY W/ MENISCAL REPAIR Right 06/23/1988   POLYPECTOMY  01/07/2023   Procedure: POLYPECTOMY;  Surgeon: Marnee Sink, MD;  Location: [facility] ENDOSCOPY;  Service: Endoscopy;;   removal of birthmark  06/08/2013   SKIN CANCER EXCISION  06/23/2012   TYMPANOSTOMY TUBE PLACEMENT  08/06/1978   URETEROSCOPY WITH HOLMIUM LASER LITHOTRIPSY Right 01/25/2016   Procedure: URETEROSCOPY WITH HOLMIUM LASER LITHOTRIPSY;  Surgeon: Bart Born, MD;  Location: [facility] ORS;   Service: Urology;  Laterality: Right;   URETHRAL STRICTURE DILATATION     visual inspection of vocal cord     1973   WISDOM TOOTH EXTRACTION     Social History:  Social History   Socioeconomic History   Marital status: Married    Spouse name: Not on file   Number of children: Not on file   Years of education: Not on file   Highest education level: Not on file  Occupational History   Not on file  Tobacco Use   Smoking status: Never   Smokeless tobacco: Never  Vaping Use   Vaping status: Never Used  Substance and Sexual Activity   Alcohol use: Not Currently    Comment: rare   Drug use: No   Sexual activity: Not Currently  Other Topics Concern   Not on file  Social History Narrative   Not on file   Social Drivers of Health   Financial Resource Strain: Low Risk  (12/05/2022)   Received from [facility]   Overall Financial Resource  Strain (CARDIA)    Difficulty of Paying Living Expenses: Not hard at all  Food Insecurity: No Food Insecurity (09/20/2023)   Hunger Vital Sign    Worried About Running Out of Food in the Last Year: Never true    Ran Out of Food in the Last Year: Never true  Transportation Needs: No Transportation Needs (09/20/2023)   PRAPARE - Administrator, Civil Service (Medical): No    Lack of Transportation (Non-Medical): No  Physical Activity: Not on file  Stress: Not on file  Social Connections: Moderately Isolated (09/20/2023)   Social Connection and Isolation Panel    Frequency of Communication with Friends and Family: More than three times a week    Frequency of Social Gatherings with Friends and Family: More than three times a week    Attends Religious Services: Never    Database administrator or Organizations: No    Attends Banker Meetings: Never    Marital Status: Married  Catering manager Violence: Not At Risk (09/20/2023)   Humiliation, Afraid, Rape, and Kick questionnaire    Fear of Current or  Ex-Partner: No    Emotionally Abused: No    Physically Abused: No    Sexually Abused: No   Family History:  Family History  Problem Relation Age of Onset   Hyperlipidemia Father    Hypertension Father    Diabetes Father    Benign prostatic hyperplasia Father    Stroke Maternal Grandmother    Diabetes Maternal Grandmother    Stroke Paternal Grandmother    Hypertension Paternal Grandmother    Kidney disease Neg Hx    Prostate cancer Neg Hx     Review of Systems: Constitutional: Doesn't report fevers, chills or abnormal weight loss Eyes: Doesn't report blurriness of vision Ears, nose, mouth, throat, and face: Doesn't report sore throat Respiratory: Doesn't report cough, dyspnea or wheezes Cardiovascular: Doesn't report palpitation, chest discomfort  Gastrointestinal:  Doesn't report nausea, constipation, diarrhea GU: Doesn't report incontinence Skin: Doesn't report skin rashes Neurological: Per HPI Musculoskeletal: Doesn't report joint pain Behavioral/Psych: Doesn't report anxiety  Physical Exam: Vitals:   12/07/23 1128  BP: (!) 144/86  Pulse: 78  Resp: 16  Temp: 99.8 F (37.7 C)  SpO2: 97%   KPS: 70. General: Fatigued Head: Normal EENT: No conjunctival injection or scleral icterus.  Lungs: Resp effort normal Cardiac: Regular rate Abdomen: Non-distended abdomen Skin: No rashes cyanosis or petechiae. Extremities: No clubbing or edema  Neurologic Exam: Mental Status: Awake, alert, attentive to examiner. Oriented to self and environment.  Language is notable for impaired fluency, with additional impairments in comprehension.  Modest psychomotor slowing, impaired 3 object recall Cranial Nerves: Visual acuity is grossly normal. Right field hemianopia. Extra-ocular movements intact. No ptosis. Mild UMN right facial paresis. Motor: Tone and bulk are normal. Power is full in both arms and legs. Reflexes are symmetric, no pathologic reflexes present.  Sensory: Intact to  light touch Gait: Normal.   Labs: I have reviewed the data as listed    Component Value Date/Time   NA 140 09/21/2023 0540   NA 138 05/04/2012 1815   K 4.3 09/21/2023 0540   K 3.5 05/04/2012 1815   CL 101 09/21/2023 0540   CL 99 05/04/2012 1815   CO2 25 09/21/2023 0540   CO2 29 05/04/2012 1815   GLUCOSE 102 (H) 09/21/2023 0540   GLUCOSE 69 05/04/2012 1815   BUN 13 09/21/2023 0540   BUN 12 05/04/2012 1815  CREATININE 1.03 09/21/2023 0540   CREATININE 0.95 08/19/2023 0818   CALCIUM  9.6 09/21/2023 0540   CALCIUM  9.2 05/04/2012 1815   PROT 6.4 (L) 09/21/2023 0540   PROT 8.5 (H) 05/04/2012 1815   ALBUMIN 3.8 09/21/2023 0540   ALBUMIN 4.5 05/04/2012 1815   AST 19 09/21/2023 0540   AST 15 05/22/2023 0936   ALT 29 09/21/2023 0540   ALT 16 05/22/2023 0936   ALT 77 05/04/2012 1815   ALKPHOS 71 09/21/2023 0540   ALKPHOS 129 05/04/2012 1815   BILITOT 0.6 09/21/2023 0540   BILITOT 0.5 05/22/2023 0936   GFRNONAA >60 09/21/2023 0540   GFRNONAA >60 05/22/2023 0936   GFRNONAA 106 12/05/2020 1014   GFRAA 123 12/05/2020 1014   Lab Results  Component Value Date   WBC 8.3 10/05/2023   NEUTROABS 5.6 10/05/2023   HGB 14.9 10/05/2023   HCT 43.2 10/05/2023   MCV 85.0 10/05/2023   PLT 207 10/05/2023   Imaging:  [facility] Clinician Interpretation: I have personally reviewed the CNS images as listed.  My interpretation, in the context of the patient's clinical presentation, is stable disease  MR BRAIN W WO CONTRAST Result Date: 12/03/2023 CLINICAL DATA:  Oligodendroglioma status post treatment, assess treatment response EXAM: MRI HEAD WITHOUT AND WITH CONTRAST TECHNIQUE: Multiplanar, multiecho pulse sequences of the brain and surrounding structures were obtained without and with intravenous contrast. CONTRAST:  7mL GADAVIST  GADOBUTROL  1 MMOL/ML IV SOLN COMPARISON:  MRI of the brain dated October 02, 2023. FINDINGS: Brain: There are new foci of increased diffusion signal present within the right  corona radiata, insular ribbon and temporal cortex present on image 29 of series 5. There has been interval normalization of increase diffusion signal and contrast enhancement previously noted within the right cerebral peduncle. There continues to be increased diffusion signal within the right frontal lobe and genu of the corpus callosum. There is persistent amorphous area of increased T1 signal within the right frontal lobe, which appears similar to the prior study. There is moderate generalized cerebral volume loss, which is more pronounced on the right. There is mild-to-moderate periventricular white matter disease, also more pronounced on the right. Vascular: Normal flow voids. Skull and upper cervical spine: Status post right frontal burr craniotomy. Normal bone marrow signal. Sinuses/Orbits: Mild mucosal disease within the ethmoid and maxillary sinuses. The orbits are unremarkable. Other: None. IMPRESSION: 1. New small foci of potential restricted diffusion within the right corona radiata, insular ribbon and temporal cortex. 2. Interval near resolution of restricted diffusion and contrast enhancement within the right cerebral peduncle. 3. Stable appearance of the patient's primary tumor site in the right frontal lobe. Electronically Signed   By: Maribeth Shivers M.D.   On: 12/03/2023 13:23    Assessment/Plan Low grade glioma of cerebrum (HCC) - Plan: MR BRAIN W WO CONTRAST  Radiation therapy induced brain necrosis - Plan: MR BRAIN W WO CONTRAST  Seizure disorder (HCC)  MATTEO BANKE is clinically stable today.  MRI brain again demonstrates mostly stable findings.  Two tiny foci of DWI bright are secondary to radiation changes or of uncertain etiology.    Agreeable with continuation of daily ASA 81mg  for stroke prevention.    Lacmital will con't 150mg  BID.  Con't Propanolol 20mg  BID which may help with headache prevention as well as blood pressure.  Amlodipine  5mg  daily will be continued as  well.  We ask that HALFORD GOETZKE return to clinic in 4 months following next brain MRI, or sooner  as needed.  All questions were answered. The patient knows to call the clinic with any problems, questions or concerns. No barriers to learning were detected.  The total time spent in the encounter was 40 minutes and more than 50% was on counseling and review of test results   Mamie Searles, MD Medical Director of Neuro-Oncology [facility] 12/07/23 1:54 PM

## 2023-12-08 ENCOUNTER — Emergency Department

## 2023-12-08 ENCOUNTER — Other Ambulatory Visit: Payer: Self-pay

## 2023-12-08 ENCOUNTER — Inpatient Hospital Stay
Admission: EM | Admit: 2023-12-08 | Discharge: 2023-12-10 | DRG: 948 | Disposition: A | Discharge status: Home / Self Care | Attending: Hospitalist | Admitting: Hospitalist

## 2023-12-08 ENCOUNTER — Ambulatory Visit: Admitting: Occupational Therapy

## 2023-12-08 DIAGNOSIS — Z823 Family history of stroke: Secondary | ICD-10-CM

## 2023-12-08 DIAGNOSIS — E878 Other disorders of electrolyte and fluid balance, not elsewhere classified: Secondary | ICD-10-CM | POA: Diagnosis present

## 2023-12-08 DIAGNOSIS — R262 Difficulty in walking, not elsewhere classified: Secondary | ICD-10-CM | POA: Diagnosis present

## 2023-12-08 DIAGNOSIS — Z8249 Family history of ischemic heart disease and other diseases of the circulatory system: Secondary | ICD-10-CM

## 2023-12-08 DIAGNOSIS — Z833 Family history of diabetes mellitus: Secondary | ICD-10-CM

## 2023-12-08 DIAGNOSIS — I639 Cerebral infarction, unspecified: Secondary | ICD-10-CM

## 2023-12-08 DIAGNOSIS — Z87442 Personal history of urinary calculi: Secondary | ICD-10-CM

## 2023-12-08 DIAGNOSIS — G40909 Epilepsy, unspecified, not intractable, without status epilepticus: Secondary | ICD-10-CM | POA: Diagnosis not present

## 2023-12-08 DIAGNOSIS — A419 Sepsis, unspecified organism: Secondary | ICD-10-CM

## 2023-12-08 DIAGNOSIS — Z91041 Radiographic dye allergy status: Secondary | ICD-10-CM

## 2023-12-08 DIAGNOSIS — Z85841 Personal history of malignant neoplasm of brain: Secondary | ICD-10-CM

## 2023-12-08 DIAGNOSIS — E119 Type 2 diabetes mellitus without complications: Secondary | ICD-10-CM | POA: Diagnosis present

## 2023-12-08 DIAGNOSIS — A4150 Gram-negative sepsis, unspecified: Secondary | ICD-10-CM | POA: Diagnosis present

## 2023-12-08 DIAGNOSIS — R4182 Altered mental status, unspecified: Secondary | ICD-10-CM | POA: Diagnosis not present

## 2023-12-08 DIAGNOSIS — E871 Hypo-osmolality and hyponatremia: Secondary | ICD-10-CM | POA: Diagnosis present

## 2023-12-08 DIAGNOSIS — E785 Hyperlipidemia, unspecified: Secondary | ICD-10-CM | POA: Insufficient documentation

## 2023-12-08 DIAGNOSIS — N390 Urinary tract infection, site not specified: Secondary | ICD-10-CM

## 2023-12-08 DIAGNOSIS — Z85828 Personal history of other malignant neoplasm of skin: Secondary | ICD-10-CM

## 2023-12-08 DIAGNOSIS — Z83438 Family history of other disorder of lipoprotein metabolism and other lipidemia: Secondary | ICD-10-CM

## 2023-12-08 DIAGNOSIS — I10 Essential (primary) hypertension: Secondary | ICD-10-CM | POA: Diagnosis present

## 2023-12-08 DIAGNOSIS — Z1152 Encounter for screening for COVID-19: Secondary | ICD-10-CM

## 2023-12-08 DIAGNOSIS — R9089 Other abnormal findings on diagnostic imaging of central nervous system: Secondary | ICD-10-CM

## 2023-12-08 DIAGNOSIS — Z7982 Long term (current) use of aspirin: Secondary | ICD-10-CM

## 2023-12-08 DIAGNOSIS — Z79899 Other long term (current) drug therapy: Secondary | ICD-10-CM

## 2023-12-08 DIAGNOSIS — R0682 Tachypnea, not elsewhere classified: Secondary | ICD-10-CM | POA: Diagnosis present

## 2023-12-08 LAB — CBC
HCT: 47.9 % (ref 39.0–52.0)
Hemoglobin: 16.5 g/dL (ref 13.0–17.0)
MCH: 29.4 pg (ref 26.0–34.0)
MCHC: 34.4 g/dL (ref 30.0–36.0)
MCV: 85.2 fL (ref 80.0–100.0)
Platelets: 176 10*3/uL (ref 150–400)
RBC: 5.62 MIL/uL (ref 4.22–5.81)
RDW: 12 % (ref 11.5–15.5)
WBC: 10.8 10*3/uL — ABNORMAL HIGH (ref 4.0–10.5)
nRBC: 0 % (ref 0.0–0.2)

## 2023-12-08 LAB — COMPREHENSIVE METABOLIC PANEL WITH GFR
ALT: 19 U/L (ref 0–44)
AST: 22 U/L (ref 15–41)
Albumin: 4.5 g/dL (ref 3.5–5.0)
Alkaline Phosphatase: 77 U/L (ref 38–126)
Anion gap: 13 (ref 5–15)
BUN: 19 mg/dL (ref 6–20)
CO2: 27 mmol/L (ref 22–32)
Calcium: 9.4 mg/dL (ref 8.9–10.3)
Chloride: 93 mmol/L — ABNORMAL LOW (ref 98–111)
Creatinine, Ser: 0.91 mg/dL (ref 0.61–1.24)
GFR, Estimated: >60 mL/min (ref >60)
Glucose, Bld: 139 mg/dL — ABNORMAL HIGH (ref 70–99)
Potassium: 4 mmol/L (ref 3.5–5.1)
Sodium: 133 mmol/L — ABNORMAL LOW (ref 135–145)
Total Bilirubin: 1.6 mg/dL — ABNORMAL HIGH (ref 0.0–1.2)
Total Protein: 8.1 g/dL (ref 6.5–8.1)

## 2023-12-08 LAB — TROPONIN I (HIGH SENSITIVITY): Troponin I (High Sensitivity): 8 ng/L (ref <18)

## 2023-12-08 LAB — RESP PANEL BY RT-PCR (RSV, FLU A&B, COVID)  RVPGX2
Influenza A by PCR: NEGATIVE
Influenza B by PCR: NEGATIVE
Resp Syncytial Virus by PCR: NEGATIVE
SARS Coronavirus 2 by RT PCR: NEGATIVE

## 2023-12-08 LAB — CBG MONITORING, ED: Glucose-Capillary: 136 mg/dL — ABNORMAL HIGH (ref 70–99)

## 2023-12-08 MED ORDER — SODIUM CHLORIDE 0.9 % IV BOLUS
1000.0000 mL | Freq: Once | INTRAVENOUS | Status: AC
  Administered 2023-12-09: 1000 mL via INTRAVENOUS

## 2023-12-08 MED ORDER — GADOBUTROL 1 MMOL/ML IV SOLN
6.0000 mL | Freq: Once | INTRAVENOUS | Status: AC | PRN
  Administered 2023-12-08: 6 mL via INTRAVENOUS

## 2023-12-08 NOTE — ED Provider Notes (Signed)
 11:30 PM  Assumed care at shift change.  Patient here with altered mental status x 3-4 days.  MRI brain, urinalysis pending.  Patient will need medical admission.  1:37 AM  Pt's MRI brain reviewed and interpreted by myself and the radiologist and shows new punctate focus in the right temporal lobe that could represent an acute infarct versus progressive small sign of tumor.  No hemorrhage.  Symptoms ongoing for 3 to 4 days.  Outside of tPA window.  Patient's urine also shows signs of infection.  Culture pending.  Will give Rocephin.  Patient has a temperature currently of 99.9 and is tachycardic, tachypneic.  Family also reports cough and congestion but COVID, flu, RSV negative, chest x-ray clear.  Will obtain blood cultures, lactic, procalcitonin.  He does have a leukocytosis of 10.8.  Patient meets sepsis criteria.  Will give additional IV fluids for 30 mL/kg bolus.  Will add on azithromycin as well for atypical pneumonia.  2:23 AM  Consulted and discussed patient's case with hospitalist, Dr. Achilles Holes.  I have recommended admission and consulting physician agrees and will place admission orders.  Patient (and family if present) agree with this plan.   I reviewed all nursing notes, vitals, pertinent previous records.  All labs, EKGs, imaging ordered have been independently reviewed and interpreted by myself.   CRITICAL CARE Performed by: Verneda Golder   Total critical care time: 30 minutes  Critical care time was exclusive of separately billable procedures and treating other patients.  Critical care was necessary to treat or prevent imminent or life-threatening deterioration.  Critical care was time spent personally by me on the following activities: development of treatment plan with patient and/or surrogate as well as nursing, discussions with consultants, evaluation of patient's response to treatment, examination of patient, obtaining history from patient or surrogate, ordering and performing  treatments and interventions, ordering and review of laboratory studies, ordering and review of radiographic studies, pulse oximetry and re-evaluation of patient's condition.    Czar Ysaguirre, Clover Dao, DO 12/09/23 [phone]

## 2023-12-08 NOTE — ED Notes (Signed)
 Patient to MRI.

## 2023-12-08 NOTE — ED Triage Notes (Signed)
 Pt to ED via POV c/o altered mental status. Per mom pt has been acting off since yesterday. Pts mom noticed speech was more slurred, gait was off, and is favoring more to right side. Has been progressively getting weaker since Friday. Hx of strokes, seizures, and brain tumor. Pt hard to understand at times and takes a while to answer. Denies pain. Pt not saying what is wrong, just says somnething isnt right.

## 2023-12-08 NOTE — ED Provider Notes (Signed)
 [facility] Provider Note    Event Date/Time   First MD Initiated Contact with Patient 12/08/23 2054     (approximate)   History   Altered Mental Status  EM caveat: Confusion  HPI  Alan Wise is a 53 y.o. male with a history of oligodendroglioma   Patient here with his mother and father, his family has been on a trip and they have been caring him for last week.  He saw his doctor earlier in the week and had an MRI.  They report that after having the MRI he has had a slowly increasing weakness, his speech has become more difficult to understand, and he has been not quite walking normally and has been slowly worsening with decreased appetite for at least the last 4 days.  They saw his doctor in follow-up yesterday, but his symptoms continue to worsen thus they brought him in tonight  He has not been complaining of anything but has not been his normal self is been more tired and sleepy and seems to be having increasing trouble walking and his speech becoming more thick  Physical Exam   Triage Vital Signs: ED Triage Vitals [12/08/23 2043]  Encounter Vitals Group     BP (!) 149/92     Girls Systolic BP Percentile      Girls Diastolic BP Percentile      Boys Systolic BP Percentile      Boys Diastolic BP Percentile      Pulse Rate 99     Resp 18     Temp 98.5 F (36.9 C)     Temp src      SpO2 93 %     Weight      Height      Head Circumference      Peak Flow      Pain Score 0     Pain Loc      Pain Education      Exclude from Growth Chart     Most recent vital signs: Vitals:   12/08/23 2043  BP: (!) 149/92  Pulse: 99  Resp: 18  Temp: 98.5 F (36.9 C)  SpO2: 93%     General: Awake, no distress.  He is interactive, but has difficulty following complex commands.  His cognition seems slowed.  His speech is rather thick, but discernible. CV:  Good peripheral perfusion.  Normal rate and tones. Resp:  Normal effort.  Clear bilateral  very slight tachypnea but otherwise normal work of breathing Abd:  No distention.  Soft nontender nondistended Other:  Moves extremities but seems generally weak but also hard to delineate with his degree of diminished cognition, he has difficulty following simple commands but there is no focal deficits though I question if he may be slightly weaker in his right leg than left.   ED Results / Procedures / Treatments   Labs (all labs ordered are listed, but only abnormal results are displayed) Labs Reviewed  CBG MONITORING, ED - Abnormal; Notable for the following components:      Result Value   Glucose-Capillary 136 (*)    All other components within normal limits     EKG  EKG interpreted by me at 2138 heart rate 100 QRS 80 QTc 420 Sinus tachycardia without evidence of ischemia    RADIOLOGY  CT head interpreted by me as or obvious abnormal lesion in the right frontal lobe, but given his complex history defer to radiologist for detailed  read.  CT Head Wo Contrast Result Date: 12/08/2023 CLINICAL DATA:  Delirium altered EXAM: CT HEAD WITHOUT CONTRAST TECHNIQUE: Contiguous axial images were obtained from the base of the skull through the vertex without intravenous contrast. RADIATION DOSE REDUCTION: This exam was performed according to the departmental dose-optimization program which includes automated exposure control, adjustment of the mA and/or kV according to patient size and/or use of iterative reconstruction technique. COMPARISON:  MRI 12/03/2023, CT brain 09/09/2023 FINDINGS: Brain: Amorphous hyperdensity in the right frontal and temporal lobes, progressive compared with head CT from March and favored to represent mineralization over hemorrhage, and corresponding to history of known brain lesions. White matter hypodensity again visible in the right frontal and temporal lobes as well as right insula. Areas of cystic encephalomalacia as before. Grossly stable size and configuration of  the ventricles with ex vacuo dilatation on the right. Vascular: No hyperdense vessels.  Carotid vascular calcification Skull: No fracture. Sinuses/Orbits: No acute finding. Mild mucosal thickening in the sinuses Other: None IMPRESSION: Amorphous hyperdensity in the right frontal and temporal lobes, progressive compared with head CT from March and favored to represent mineralization over hemorrhage, and corresponding to history of known brain lesions. Other grossly stable areas of white matter hypodensity as demonstrated on recent MRI imaging. Stable ventricle size and morphology compared to prior. Electronically Signed   By: Esmeralda Hedge M.D.   On: 12/08/2023 21:58   DG Chest Portable 1 View Result Date: 12/08/2023 CLINICAL DATA:  Altered mental status. EXAM: PORTABLE CHEST 1 VIEW COMPARISON:  September 09, 2023 FINDINGS: The heart size and mediastinal contours are within normal limits. Both lungs are clear. The visualized skeletal structures are unremarkable. IMPRESSION: No active disease. Electronically Signed   By: Virgle Grime M.D.   On: 12/08/2023 21:30         PROCEDURES:  Critical Care performed: No  Procedures   MEDICATIONS ORDERED IN ED: Medications - No data to display   IMPRESSION / MDM / ASSESSMENT AND PLAN / ED COURSE  I reviewed the triage vital signs and the nursing notes.                              Differential diagnosis includes, but is not limited to, tumor progression, edema, intracranial hemorrhage, intermittent or recurrent stroke, recrudescence like phenomena, infection dehydration urinary tract infection, electrolyte abnormality etc.  The differential diagnosis being quite broad for this gentleman with a complex history of brain tumor.   He has slow but steady progression of symptoms with noted difficulty walking thickness of speech and mental status changes occurring over the last 4 to 5 days.  CT scan does not demonstrate an obvious acute lesion, but we will  follow this by obtaining MRI of the brain with and without contrast.  ----------------------------------------- 10:51 PM on 12/08/2023 ----------------------------------------- And thus far patient's labs unrevealing as to cause.  Initial troponin normal.  CBC metabolic panel unremarkable with exception to very mild leukocytosis as well as very mild hyponatremia.  Family does report decreased oral intake over the last few days will hydrate  Patient's presentation is most consistent with acute complicated illness / injury requiring diagnostic workup.    Care signed to Dr. Author Board.  Anticipate the patient will require admission but given his complex history currently awaiting biopsy of brain to evaluate if change in tumor burden, edema, interim stroke etc. is possible.  Additional urinalysis is pending at this time.  FINAL CLINICAL IMPRESSION(S) / ED DIAGNOSES   Final diagnoses:  None     Rx / DC Orders   ED Discharge Orders     None        Note:  This document was prepared using Dragon voice recognition software and may include unintentional dictation errors.   Iver Marker, MD 12/09/23 [phone]

## 2023-12-09 ENCOUNTER — Ambulatory Visit

## 2023-12-09 ENCOUNTER — Encounter: Admitting: Speech Pathology

## 2023-12-09 ENCOUNTER — Encounter: Payer: Self-pay | Admitting: Family Medicine

## 2023-12-09 ENCOUNTER — Inpatient Hospital Stay

## 2023-12-09 DIAGNOSIS — Z87442 Personal history of urinary calculi: Secondary | ICD-10-CM | POA: Diagnosis not present

## 2023-12-09 DIAGNOSIS — I10 Essential (primary) hypertension: Secondary | ICD-10-CM | POA: Diagnosis present

## 2023-12-09 DIAGNOSIS — Z83438 Family history of other disorder of lipoprotein metabolism and other lipidemia: Secondary | ICD-10-CM | POA: Diagnosis not present

## 2023-12-09 DIAGNOSIS — Z833 Family history of diabetes mellitus: Secondary | ICD-10-CM | POA: Diagnosis not present

## 2023-12-09 DIAGNOSIS — Z1152 Encounter for screening for COVID-19: Secondary | ICD-10-CM | POA: Diagnosis not present

## 2023-12-09 DIAGNOSIS — E871 Hypo-osmolality and hyponatremia: Secondary | ICD-10-CM | POA: Diagnosis present

## 2023-12-09 DIAGNOSIS — Z8249 Family history of ischemic heart disease and other diseases of the circulatory system: Secondary | ICD-10-CM | POA: Diagnosis not present

## 2023-12-09 DIAGNOSIS — A4150 Gram-negative sepsis, unspecified: Secondary | ICD-10-CM | POA: Diagnosis present

## 2023-12-09 DIAGNOSIS — R262 Difficulty in walking, not elsewhere classified: Secondary | ICD-10-CM | POA: Diagnosis present

## 2023-12-09 DIAGNOSIS — E785 Hyperlipidemia, unspecified: Secondary | ICD-10-CM | POA: Insufficient documentation

## 2023-12-09 DIAGNOSIS — Z85828 Personal history of other malignant neoplasm of skin: Secondary | ICD-10-CM | POA: Diagnosis not present

## 2023-12-09 DIAGNOSIS — Z7982 Long term (current) use of aspirin: Secondary | ICD-10-CM | POA: Diagnosis not present

## 2023-12-09 DIAGNOSIS — Z85841 Personal history of malignant neoplasm of brain: Secondary | ICD-10-CM | POA: Diagnosis not present

## 2023-12-09 DIAGNOSIS — Z823 Family history of stroke: Secondary | ICD-10-CM | POA: Diagnosis not present

## 2023-12-09 DIAGNOSIS — R9089 Other abnormal findings on diagnostic imaging of central nervous system: Secondary | ICD-10-CM

## 2023-12-09 DIAGNOSIS — E119 Type 2 diabetes mellitus without complications: Secondary | ICD-10-CM | POA: Diagnosis present

## 2023-12-09 DIAGNOSIS — R0682 Tachypnea, not elsewhere classified: Secondary | ICD-10-CM | POA: Diagnosis present

## 2023-12-09 DIAGNOSIS — G40909 Epilepsy, unspecified, not intractable, without status epilepticus: Secondary | ICD-10-CM | POA: Diagnosis present

## 2023-12-09 DIAGNOSIS — N390 Urinary tract infection, site not specified: Secondary | ICD-10-CM | POA: Diagnosis present

## 2023-12-09 DIAGNOSIS — E878 Other disorders of electrolyte and fluid balance, not elsewhere classified: Secondary | ICD-10-CM | POA: Diagnosis present

## 2023-12-09 DIAGNOSIS — Z79899 Other long term (current) drug therapy: Secondary | ICD-10-CM | POA: Diagnosis not present

## 2023-12-09 DIAGNOSIS — Z91041 Radiographic dye allergy status: Secondary | ICD-10-CM | POA: Diagnosis not present

## 2023-12-09 DIAGNOSIS — R4182 Altered mental status, unspecified: Secondary | ICD-10-CM | POA: Diagnosis present

## 2023-12-09 LAB — BASIC METABOLIC PANEL WITH GFR
Anion gap: 7 (ref 5–15)
BUN: 17 mg/dL (ref 6–20)
CO2: 26 mmol/L (ref 22–32)
Calcium: 8 mg/dL — ABNORMAL LOW (ref 8.9–10.3)
Chloride: 101 mmol/L (ref 98–111)
Creatinine, Ser: 0.77 mg/dL (ref 0.61–1.24)
GFR, Estimated: >60 mL/min (ref >60)
Glucose, Bld: 117 mg/dL — ABNORMAL HIGH (ref 70–99)
Potassium: 3.8 mmol/L (ref 3.5–5.1)
Sodium: 134 mmol/L — ABNORMAL LOW (ref 135–145)

## 2023-12-09 LAB — CBC
HCT: 36.7 % — ABNORMAL LOW (ref 39.0–52.0)
Hemoglobin: 12.9 g/dL — ABNORMAL LOW (ref 13.0–17.0)
MCH: 29.9 pg (ref 26.0–34.0)
MCHC: 35.1 g/dL (ref 30.0–36.0)
MCV: 85 fL (ref 80.0–100.0)
Platelets: 131 10*3/uL — ABNORMAL LOW (ref 150–400)
RBC: 4.32 MIL/uL (ref 4.22–5.81)
RDW: 12.1 % (ref 11.5–15.5)
WBC: 11.9 10*3/uL — ABNORMAL HIGH (ref 4.0–10.5)
nRBC: 0 % (ref 0.0–0.2)

## 2023-12-09 LAB — PROTIME-INR
INR: 1.2 (ref 0.8–1.2)
Prothrombin Time: 15.1 s (ref 11.4–15.2)

## 2023-12-09 LAB — PROCALCITONIN: Procalcitonin: <0.1 ng/mL

## 2023-12-09 LAB — LACTIC ACID, PLASMA
Lactic Acid, Venous: 1.1 mmol/L (ref 0.5–1.9)
Lactic Acid, Venous: 1.3 mmol/L (ref 0.5–1.9)

## 2023-12-09 LAB — URINALYSIS, ROUTINE W REFLEX MICROSCOPIC
Bilirubin Urine: NEGATIVE
Glucose, UA: NEGATIVE mg/dL
Hgb urine dipstick: NEGATIVE
Ketones, ur: 20 mg/dL — AB
Leukocytes,Ua: NEGATIVE
Nitrite: NEGATIVE
Protein, ur: >=300 mg/dL — AB
Specific Gravity, Urine: 1.029 (ref 1.005–1.030)
pH: 5 (ref 5.0–8.0)

## 2023-12-09 LAB — URINE DRUG SCREEN, QUALITATIVE (ARMC ONLY)
Amphetamines, Ur Screen: POSITIVE — AB
Barbiturates, Ur Screen: NOT DETECTED
Benzodiazepine, Ur Scrn: NOT DETECTED
Cannabinoid 50 Ng, Ur ~~LOC~~: NOT DETECTED
Cocaine Metabolite,Ur ~~LOC~~: NOT DETECTED
MDMA (Ecstasy)Ur Screen: NOT DETECTED
Methadone Scn, Ur: NOT DETECTED
Opiate, Ur Screen: NOT DETECTED
Phencyclidine (PCP) Ur S: NOT DETECTED
Tricyclic, Ur Screen: NOT DETECTED

## 2023-12-09 LAB — LIPID PANEL
Cholesterol: 115 mg/dL (ref 0–200)
HDL: 24 mg/dL — ABNORMAL LOW (ref >40)
LDL Cholesterol: 81 mg/dL (ref 0–99)
Total CHOL/HDL Ratio: 4.8 ratio
Triglycerides: 48 mg/dL (ref <150)
VLDL: 10 mg/dL (ref 0–40)

## 2023-12-09 LAB — CORTISOL-AM, BLOOD: Cortisol - AM: 8.4 ug/dL (ref 6.7–22.6)

## 2023-12-09 MED ORDER — LAMOTRIGINE 100 MG PO TABS
150.0000 mg | ORAL_TABLET | Freq: Two times a day (BID) | ORAL | Status: DC
  Administered 2023-12-09 (×2): 150 mg via ORAL
  Filled 2023-12-09 (×3): qty 2

## 2023-12-09 MED ORDER — LACTATED RINGERS IV SOLN
150.0000 mL/h | INTRAVENOUS | Status: DC
Start: 2023-12-09 — End: 2023-12-09
  Administered 2023-12-09: 150 mL/h via INTRAVENOUS

## 2023-12-09 MED ORDER — ASPIRIN 81 MG PO TBEC
81.0000 mg | DELAYED_RELEASE_TABLET | Freq: Every day | ORAL | Status: DC
  Filled 2023-12-09: qty 1

## 2023-12-09 MED ORDER — PROPRANOLOL HCL 20 MG PO TABS
20.0000 mg | ORAL_TABLET | Freq: Two times a day (BID) | ORAL | Status: DC
  Administered 2023-12-09 (×2): 20 mg via ORAL
  Filled 2023-12-09 (×4): qty 1

## 2023-12-09 MED ORDER — METHYLPREDNISOLONE SODIUM SUCC 40 MG IJ SOLR
40.0000 mg | Freq: Once | INTRAMUSCULAR | Status: AC
  Administered 2023-12-09: 40 mg via INTRAVENOUS
  Filled 2023-12-09: qty 1

## 2023-12-09 MED ORDER — SODIUM CHLORIDE 0.9 % IV SOLN
2.0000 g | Freq: Once | INTRAVENOUS | Status: AC
  Administered 2023-12-09: 2 g via INTRAVENOUS
  Filled 2023-12-09: qty 20

## 2023-12-09 MED ORDER — MAGNESIUM HYDROXIDE 400 MG/5ML PO SUSP: 30.0000 mL | Freq: Every day | ORAL | Status: DC | PRN

## 2023-12-09 MED ORDER — STROKE: EARLY STAGES OF RECOVERY BOOK: Freq: Once | Status: AC

## 2023-12-09 MED ORDER — AMLODIPINE BESYLATE 5 MG PO TABS
5.0000 mg | ORAL_TABLET | Freq: Every day | ORAL | Status: DC
  Administered 2023-12-09: 5 mg via ORAL
  Filled 2023-12-09 (×2): qty 1

## 2023-12-09 MED ORDER — ACETAMINOPHEN 325 MG PO TABS
650.0000 mg | ORAL_TABLET | Freq: Four times a day (QID) | ORAL | Status: DC | PRN
Start: 2023-12-09 — End: 2023-12-10

## 2023-12-09 MED ORDER — ASPIRIN 81 MG PO CHEW
324.0000 mg | CHEWABLE_TABLET | Freq: Once | ORAL | Status: AC
  Administered 2023-12-09: 324 mg via ORAL
  Filled 2023-12-09: qty 4

## 2023-12-09 MED ORDER — ONDANSETRON HCL 4 MG PO TABS: 4.0000 mg | ORAL_TABLET | Freq: Four times a day (QID) | ORAL | Status: DC | PRN

## 2023-12-09 MED ORDER — LACTATED RINGERS IV SOLN: INTRAVENOUS | Status: DC

## 2023-12-09 MED ORDER — SODIUM CHLORIDE 0.9 % IV SOLN
2.0000 g | INTRAVENOUS | Status: DC
  Filled 2023-12-09 (×2): qty 20

## 2023-12-09 MED ORDER — ACETAMINOPHEN 650 MG RE SUPP: 650.0000 mg | Freq: Four times a day (QID) | RECTAL | Status: DC | PRN

## 2023-12-09 MED ORDER — ENOXAPARIN SODIUM 40 MG/0.4ML IJ SOSY
40.0000 mg | PREFILLED_SYRINGE | INTRAMUSCULAR | Status: DC
  Administered 2023-12-09: 40 mg via SUBCUTANEOUS
  Filled 2023-12-09 (×2): qty 0.4

## 2023-12-09 MED ORDER — SODIUM CHLORIDE 0.9 % IV SOLN
500.0000 mg | Freq: Once | INTRAVENOUS | Status: AC
  Administered 2023-12-09: 500 mg via INTRAVENOUS
  Filled 2023-12-09: qty 5

## 2023-12-09 MED ORDER — DIPHENHYDRAMINE HCL 25 MG PO CAPS: 50.0000 mg | ORAL_CAPSULE | Freq: Once | ORAL | Status: AC

## 2023-12-09 MED ORDER — DIPHENHYDRAMINE HCL 50 MG/ML IJ SOLN
50.0000 mg | Freq: Once | INTRAMUSCULAR | Status: AC
  Administered 2023-12-09: 50 mg via INTRAVENOUS
  Filled 2023-12-09: qty 1

## 2023-12-09 MED ORDER — TRAZODONE HCL 50 MG PO TABS: 25.0000 mg | ORAL_TABLET | Freq: Every evening | ORAL | Status: DC | PRN

## 2023-12-09 MED ORDER — IOHEXOL 350 MG/ML SOLN
75.0000 mL | Freq: Once | INTRAVENOUS | Status: AC | PRN
  Administered 2023-12-09: 75 mL via INTRAVENOUS

## 2023-12-09 MED ORDER — ENSURE PLUS HIGH PROTEIN PO LIQD: 237.0000 mL | Freq: Two times a day (BID) | ORAL | Status: DC

## 2023-12-09 MED ORDER — LACTATED RINGERS IV BOLUS
1000.0000 mL | Freq: Once | INTRAVENOUS | Status: AC
  Administered 2023-12-09: 1000 mL via INTRAVENOUS

## 2023-12-09 MED ORDER — ACETAMINOPHEN 500 MG PO TABS
1000.0000 mg | ORAL_TABLET | Freq: Once | ORAL | Status: AC
  Administered 2023-12-09: 1000 mg via ORAL
  Filled 2023-12-09: qty 2

## 2023-12-09 MED ORDER — ONDANSETRON HCL 4 MG/2ML IJ SOLN: 4.0000 mg | Freq: Four times a day (QID) | INTRAMUSCULAR | Status: DC | PRN

## 2023-12-09 NOTE — Sepsis Progress Note (Signed)
 Elink monitoring for the code sepsis protocol.

## 2023-12-09 NOTE — Assessment & Plan Note (Signed)
-   He has suspected right temporal lobe infarct versus tumor. - Neurology consult to be obtained.  I notified Dr. Doretta Gant about patient - Will follow neurochecks. - PT/OT eval will be obtained. - Will continue on aspirin  and statin therapy. - Fasting lipids will be obtained.

## 2023-12-09 NOTE — Evaluation (Signed)
 Physical Therapy Evaluation Patient Details Name: Alan Wise MRN: [id number] DOB: 1970/09/15 Today's Date: 12/09/2023  History of Present Illness  Pt is a 53 y.o. male presented to the ER with acute onset of AMS and recently diminished appetite over the last 4 days. Pt saw PCP earlier this week and had an MRI as he has been having slowly increasing weakness and difficulty with his speech and not ambulating as usual. MRI brain: new punctate focus in the R temporal lobe-acute infarct vs progressive small sign of tumor with no hemorrhage. Admitted for management of sepsis d/t UTI,  abnormal MRI. PMH includes oligodendroglioma, brain tumor with necrosis, h/o multiple CVAs with aphasia, DM, seizure disorder, GERD, DM2, dyslipidemia and urolithiasis.   Clinical Impression  Pt admitted with above diagnosis. Pt currently with functional limitations due to the deficits listed below (see PT Problem List). Pt received upright in bed with family present agreeable to PT. Pt with baseline aphasia thus needing some assist from family in room for PLOF. Pt currently been receiving OPPT and reports being independent with gait and ADL's. Spouse assists with IADL's.   To date, pt appears close to baseline. Pt is able to exit bed and needs CGA to stand initially. Close CGA provided in room due to L visual field cut (chronic/baseline) and small spaces with pt needing min VC'/s and slowed gait due to IV pole. Once in hallway pt ambulating CGA to supervision a 2'/sec indicative of low falls risk with community and household distances completing ~250'. Seated EOB upon return to room for recliner set up and able to stand at supervision second rep with gait around bed CGA to recliner. Pt left with all needs in reach. Anticipate pt is close to baseline. Plan is to return to current POC with OPPT.      If plan is discharge home, recommend the following: A little help with walking and/or transfers;A little help with  bathing/dressing/bathroom;Assistance with cooking/housework;Assist for transportation;Two people to help with walking and/or transfers;Help with stairs or ramp for entrance   Can travel by private vehicle        Equipment Recommendations None recommended by PT  Recommendations for Other Services       Functional Status Assessment Patient has had a recent decline in their functional status and demonstrates the ability to make significant improvements in function in a reasonable and predictable amount of time.     Precautions / Restrictions Precautions Precautions: Fall Restrictions Other Position/Activity Restrictions: L visual field cut at baseline      Mobility  Bed Mobility Overal bed mobility: Modified Independent               Patient Response: Cooperative, Flat affect  Transfers Overall transfer level: Needs assistance   Transfers: Sit to/from Stand Sit to Stand: Contact guard assist, Supervision           General transfer comment: first rep, CGA, second rep supervision.    Ambulation/Gait Ambulation/Gait assistance: Contact guard assist Gait Distance (Feet): 250 Feet Assistive device: IV Pole Gait Pattern/deviations: Step-through pattern       General Gait Details: needs CGA to help manage IV pole and due to L visual field deficits. Notably improved safety with gait in open space of hallway compared to room. Per pt and family, gait close to baseline.  Stairs            Wheelchair Mobility     Tilt Bed Tilt Bed Patient Response: Cooperative, Flat affect  Modified  Rankin (Stroke Patients Only)       Balance Overall balance assessment: Needs assistance Sitting-balance support: Feet supported Sitting balance-Leahy Scale: Fair       Standing balance-Leahy Scale: Fair Standing balance comment: PRN need for SUE support on IV pole for balance                             Pertinent Vitals/Pain Pain Assessment Pain  Assessment: No/denies pain    Home Living Family/patient expects to be discharged to:: Private residence Living Arrangements: Spouse/significant other;Children Available Help at Discharge: Family;Available 24 hours/day Type of Home: House Home Access: Stairs to enter Entrance Stairs-Rails: Right;Left Entrance Stairs-Number of Steps: 4 Alternate Level Stairs-Number of Steps: flight of steps to 2nd floor bedroom Home Layout: Two level;Able to live on main level with bedroom/bathroom Home Equipment: Shower seat - built in;Hand held shower head Additional Comments: built in shower seat in upstairs shower; no seated in downstairs shower    Prior Function Prior Level of Function : Needs assist             Mobility Comments: IND with ambulation; no falls ADLs Comments: IND with ADLs, wife does the driving, manages finances and med management     Extremity/Trunk Assessment   Upper Extremity Assessment Upper Extremity Assessment: Left hand dominant;LUE deficits/detail;Generalized weakness LUE Deficits / Details: very mild weakness to LUE, still able to use it for ADL tasks    Lower Extremity Assessment Lower Extremity Assessment: LLE deficits/detail;Overall WFL for tasks assessed LLE Deficits / Details: Mild LLE weakness compared to RLE via MMT LLE Sensation: WNL    Cervical / Trunk Assessment Cervical / Trunk Assessment: Normal  Communication   Communication Communication: Impaired Factors Affecting Communication: Difficulty expressing self;Reduced clarity of speech    Cognition Arousal: Alert Behavior During Therapy: WFL for tasks assessed/performed   PT - Cognitive impairments: No apparent impairments                       PT - Cognition Comments: able to follow all commands Following commands: Intact       Cueing Cueing Techniques: Verbal cues     General Comments      Exercises     Assessment/Plan    PT Assessment Patient needs continued PT  services  PT Problem List Decreased strength;Decreased activity tolerance;Decreased balance;Decreased mobility       PT Treatment Interventions DME instruction;Balance training;Gait training;Neuromuscular re-education;Stair training;Functional mobility training;Patient/family education;Therapeutic activities;Therapeutic exercise    PT Goals (Current goals can be found in the Care Plan section)  Acute Rehab PT Goals Patient Stated Goal: return to OPPT PT Goal Formulation: With patient/family Time For Goal Achievement: 12/23/23 Potential to Achieve Goals: Good    Frequency Min 2X/week     Co-evaluation               AM-PAC PT 6 Clicks Mobility  Outcome Measure Help needed turning from your back to your side while in a flat bed without using bedrails?: A Little Help needed moving from lying on your back to sitting on the side of a flat bed without using bedrails?: A Little Help needed moving to and from a bed to a chair (including a wheelchair)?: A Little Help needed standing up from a chair using your arms (e.g., wheelchair or bedside chair)?: A Little Help needed to walk in hospital room?: A Little Help needed climbing 3-5 steps with a  railing? : A Little 6 Click Score: 18    End of Session Equipment Utilized During Treatment: Gait belt Activity Tolerance: Patient tolerated treatment well Patient left: in chair;with call bell/phone within reach Nurse Communication: Mobility status PT Visit Diagnosis: Unsteadiness on feet (R26.81);Muscle weakness (generalized) (M62.81)    Time: 9604-5409 PT Time Calculation (min) (ACUTE ONLY): 28 min   Charges:   PT Evaluation $PT Eval Low Complexity: 1 Low PT Treatments $Therapeutic Activity: 8-22 mins PT General Charges $$ ACUTE PT VISIT: 1 Visit         Marc Senior. Fairly IV, PT, DPT Physical Therapist- [place]  [facility] 12/09/2023, 12:57 PM

## 2023-12-09 NOTE — Assessment & Plan Note (Signed)
-   Will continue statin therapy and check fasting lipids as mentioned above.

## 2023-12-09 NOTE — Evaluation (Signed)
 Occupational Therapy Evaluation Patient Details Name: Alan Wise MRN: [id number] DOB: [birth year] Today's Date: 12/09/2023   History of Present Illness   Pt is a 53 y.o. male presented to the ER with acute onset of AMS and recently diminished appetite over the last 4 days. Pt saw PCP earlier this week and had an MRI as he has been having slowly increasing weakness and difficulty with his speech and not ambulating as usual. MRI brain: new punctate focus in the R temporal lobe-acute infarct vs progressive small sign of tumor with no hemorrhage. Admitted for management of sepsis d/t UTI,  abnormal MRI. PMH includes oligodendroglioma, brain tumor with necrosis, h/o multiple CVAs with aphasia, DM, seizure disorder, GERD, DM2, dyslipidemia and urolithiasis.     Clinical Impressions Pt was seen for OT evaluation this date. PTA, pt resides at home with his wife and children in a 2 level home with 4 STE and bil HR. He is typically IND with ADLs and mobility without use of AD. There is a flight of stairs to the 2nd level, but pt is able to reside on the ground level. He currently attends outpatient PT, OT, ST.  Pt presents to acute OT demonstrating impaired ADL performance and functional mobility 2/2 L sided weakness, mild balance deficits, and continued visual and speech deficits. Pt currently requires MOD I for bed mobility, CGA/SUP for STS from EOB and chair in room. He attempted to don shoes seated at EOB, but reported he would do better from the chair. Transferred to the chair with CGA and donned shoes with supervision, increased time to tie shoes. Per mother in room, he typically takes increased time to complete tasks but is able to do it on his own. He ambulated in the room with cues for safety d/t L visual field cut (baseline) as he would bump into things, cues to turn head to see from R eye. Standing oral care performed with SBA. Pt ambulated ~80-100 ft without AD with CGA, no LOB.  Pt would  benefit from skilled OT services to address noted impairments and functional limitations to maximize safety and independence while minimizing falls risk and caregiver burden. Pt would like to resume his outpatient therapy services on DC.      If plan is discharge home, recommend the following:   A little help with walking and/or transfers;A little help with bathing/dressing/bathroom;Help with stairs or ramp for entrance;Assistance with cooking/housework;Direct supervision/assist for medications management;Supervision due to cognitive status;Direct supervision/assist for financial management     Functional Status Assessment   Patient has had a recent decline in their functional status and demonstrates the ability to make significant improvements in function in a reasonable and predictable amount of time.     Equipment Recommendations   None recommended by OT     Recommendations for Other Services         Precautions/Restrictions   Precautions Precautions: Fall Recall of Precautions/Restrictions: Intact Restrictions Other Position/Activity Restrictions: L visual field cut at baseline     Mobility Bed Mobility Overal bed mobility: Modified Independent                  Transfers Overall transfer level: Needs assistance   Transfers: Sit to/from Stand Sit to Stand: Contact guard assist, Supervision           General transfer comment: CGA to supervision for STS from EOB and chair in room; able to ambulate ~80-100 ft without AD with CGA      Balance  Overall balance assessment: Needs assistance Sitting-balance support: Feet supported Sitting balance-Leahy Scale: Fair Sitting balance - Comments: no LOB with LB dressing   Standing balance support: During functional activity, Single extremity supported Standing balance-Leahy Scale: Fair Standing balance comment: unilateral support on sink during oral care with supervision; pt bumps into things in the room  d/t L visual field cut                           ADL either performed or assessed with clinical judgement   ADL Overall ADL's : Needs assistance/impaired     Grooming: Oral care;Wash/dry hands;Supervision/safety;Cueing for safety Grooming Details (indicate cue type and reason): d/t low vision needed cueing for location of items             Lower Body Dressing: Supervision/safety;Sitting/lateral leans Lower Body Dressing Details (indicate cue type and reason): increased time needed d/t low vision L hand coordination deficits Toilet Transfer: Supervision/safety;Contact guard assist Toilet Transfer Details (indicate cue type and reason): simulated to chair in room from bed                 Vision   Additional Comments: L visual field cut deficit at baseline     Perception         Praxis         Pertinent Vitals/Pain Pain Assessment Pain Assessment: No/denies pain     Extremity/Trunk Assessment Upper Extremity Assessment Upper Extremity Assessment: Left hand dominant;LUE deficits/detail;Generalized weakness LUE Deficits / Details: very mild weakness to LUE, still able to use it for ADL tasks   Lower Extremity Assessment Lower Extremity Assessment: LLE deficits/detail;Overall WFL for tasks assessed LLE Deficits / Details: Mild LLE weakness compared to RLE via MMT LLE Sensation: WNL   Cervical / Trunk Assessment Cervical / Trunk Assessment: Normal   Communication Communication Communication: Impaired Factors Affecting Communication: Difficulty expressing self;Reduced clarity of speech   Cognition Arousal: Alert Behavior During Therapy: WFL for tasks assessed/performed Cognition: No apparent impairments             OT - Cognition Comments: states [place]; states it is May 2024                 Following commands: Intact       Cueing  General Comments   Cueing Techniques: Verbal cues  VSS, sp02 87-91% on RA; pt denies  SOB   Exercises Other Exercises Other Exercises: Edu on role of OT in acute setting   Shoulder Instructions      Home Living Family/patient expects to be discharged to:: Private residence Living Arrangements: Spouse/significant other;Children Available Help at Discharge: Family;Available 24 hours/day Type of Home: House Home Access: Stairs to enter [employer] of Steps: 4 Entrance Stairs-Rails: Right;Left Home Layout: Two level;Able to live on main level with bedroom/bathroom Alternate Level Stairs-Number of Steps: flight of steps to 2nd floor bedroom Alternate Level Stairs-Rails: Right;Left Bathroom Shower/Tub: Walk-in shower;Door   [employer] Toilet: Pharmacist, community: Yes   Home Equipment: Shower seat - built in;Hand held shower head   Additional Comments: built in shower seat in upstairs shower; no seated in downstairs shower      Prior Functioning/Environment Prior Level of Function : Needs assist             Mobility Comments: IND with ambulation; no falls ADLs Comments: IND with ADLs, wife does the driving, manages finances and med management    OT Problem List:  Decreased strength;Decreased coordination;Decreased cognition;Decreased safety awareness;Impaired balance (sitting and/or standing);Impaired vision/perception   OT Treatment/Interventions: Self-care/ADL training;Therapeutic exercise;Therapeutic activities;Patient/family education;Balance training;Cognitive remediation/compensation;Visual/perceptual remediation/compensation      OT Goals(Current goals can be found in the care plan section)   Acute Rehab OT Goals Patient Stated Goal: return home OT Goal Formulation: With patient/family Time For Goal Achievement: 12/23/23 Potential to Achieve Goals: Good ADL Goals Pt Will Perform Lower Body Bathing: with modified independence;sitting/lateral leans;sit to/from stand Pt Will Perform Lower Body Dressing: with modified  independence;sitting/lateral leans;sit to/from stand Additional ADL Goal #1: Pt will demo 1 learned visual compensatory strategy to implement during ADL performance to maximize safety and ease.   OT Frequency:  Min 2X/week    Co-evaluation              AM-PAC OT 6 Clicks Daily Activity     Outcome Measure Help from another person eating meals?: None Help from another person taking care of personal grooming?: A Little Help from another person toileting, which includes using toliet, bedpan, or urinal?: A Little Help from another person bathing (including washing, rinsing, drying)?: A Little Help from another person to put on and taking off regular upper body clothing?: None Help from another person to put on and taking off regular lower body clothing?: A Little 6 Click Score: 20   End of Session Equipment Utilized During Treatment: Gait belt Nurse Communication: Mobility status  Activity Tolerance: Patient tolerated treatment well Patient left: in bed;with call bell/phone within reach;with bed alarm set;with family/visitor present  OT Visit Diagnosis: Other abnormalities of gait and mobility (R26.89);Unsteadiness on feet (R26.81);Cognitive communication deficit (R41.841) Symptoms and signs involving cognitive functions: Cerebral infarction                Time: 1031-1110 OT Time Calculation (min): 39 min Charges:  OT General Charges $OT Visit: 1 Visit OT Evaluation $OT Eval Moderate Complexity: 1 Mod OT Treatments $Self Care/Home Management : 8-22 mins  Alan Wise, OTR/L  12/09/23, 1:19 PM  Alan Wise 12/09/2023, 1:11 PM

## 2023-12-09 NOTE — Assessment & Plan Note (Signed)
Will continue antihypertensive therapy.

## 2023-12-09 NOTE — Progress Notes (Signed)
 Patient choked eating a [employer] hamburger. Patient was able to cough to clear food from throat. I repeated [facility] swallow evaluation which he did pass. I feel patient and family would benefit from a visit again from SLP.

## 2023-12-09 NOTE — Progress Notes (Signed)
 Mobility Specialist - Progress Note   12/09/23 1500  Mobility  Activity Ambulated with assistance in room;Ambulated with assistance to bathroom  Level of Assistance Standby assist, set-up cues, supervision of patient - no hands on  Assistive Device None  Distance Ambulated (ft) 20 ft  Activity Response Tolerated well  Mobility visit 1 Mobility     Pt ambulated in room with close supervision. No LOB without AD. Pt returned to bed with alarm set, needs in reach. Family at bedside.    Searcy Czech Mobility Specialist 12/09/23, 3:57 PM

## 2023-12-09 NOTE — Progress Notes (Signed)
 CODE SEPSIS - PHARMACY COMMUNICATION  **Broad Spectrum Antibiotics should be administered within 1 hour of Sepsis diagnosis**  Time Code Sepsis Called/Page Received: 6/18 @ 0142   Antibiotics Ordered: Ceftriaxone, Azithromycin  Time of 1st antibiotic administration: Ceftriaxone 2 gm IV X 1 on 6/18 @ 0137   Additional action taken by pharmacy:   If necessary, Name of Provider/Nurse Contacted:     Zanyiah Posten D ,PharmD Clinical Pharmacist  12/09/2023  1:49 AM

## 2023-12-09 NOTE — Assessment & Plan Note (Signed)
-   This is manifested by fever, tachycardia and tachypnea. - The patient be admitted to a severe bed. - Will continue antibiotic therapy with IV Rocephin and follow blood and urine culture and sensitivity. - Will continue hydration with IV lactated ringer .

## 2023-12-09 NOTE — H&P (Addendum)
 [place]   PATIENT NAME: Alan Wise    MR#:  [id number]  DATE OF BIRTH:  12/14/1970  DATE OF ADMISSION:  12/08/2023  PRIMARY CARE PHYSICIAN: Alan Cirri, NP   Patient is coming from: Home  REQUESTING/REFERRING PHYSICIAN: Ward, Starling Eck, DO  CHIEF COMPLAINT:   Chief Complaint  Patient presents with   Altered Mental Status    HISTORY OF PRESENT ILLNESS:  Alan Wise is a 53 y.o. Caucasian male with medical history significant for oligodendroglioma, GERD, type 2 diabetes mellitus, dyslipidemia and urolithiasis, presented to the emergency room with acute onset of altered mental status and recently diminished appetite over the last 4 days.  He saw his PCP earlier this week and had an MRI as he has been having slowly increasing weakness and difficulty with his speech which has been more difficult to understand.  He has not been ambulating as usual.  He has been more fatigued and tired and somnolent.  He denies any nausea or vomiting or abdominal pain.  No fever or chills.  No dysuria, oliguria or hematuria or flank pain.  No worsening paresthesias or focal muscle weakness.  No tinnitus or vertigo.  No dysphagia.  He has been unsteady with his gait.  His family.  ED Course: When he came to the ER BP was 149/92 with otherwise normal vitals.  Labs revealed mild hyponatremia and hypochloremia and total bili of 1.6.  Procalcitonin was less than 0.1.  CBC showed WBCs of 10.8.  Respiratory panel came back negative. EKG as reviewed by me : Showed sinus tachycardia with a rate of 102 with poor R wave progression. Imaging: Noncontrasted head CT scan revealed the following: Amorphous hyperdensity in the right frontal and temporal lobes, progressive compared with head CT from March and favored to represent mineralization over hemorrhage, and corresponding to history of known brain lesions. Other grossly stable areas of white matter hypodensity as demonstrated on recent MRI  imaging. Stable ventricle size and morphology compared to prior. Portable chest x-ray showed no acute cardiopulmonary disease. Brain MRI here showed new punctate focus in the right temporal lobe that could represent an acute infarct versus progressive small sign of tumor with no hemorrhage.  The patient was given IV Rocephin and Zithromax, 4-week aspirin  and 1 g of p.o. Tylenol  as well as 1 L bolus of IV normal saline and 1 L lactated ringer .  He will be admitted to a progressive unit bed for further evaluation and management PAST MEDICAL HISTORY:   Past Medical History:  Diagnosis Date   Allergy    Complication of anesthesia    pt reports he is starting to have more difficulty arousing after surgery   Diabetes mellitus (HCC)    GERD (gastroesophageal reflux disease)    Headache    History of kidney stones    Hyperlipidemia    Renal disorder    kidney stones    PAST SURGICAL HISTORY:   Past Surgical History:  Procedure Laterality Date   APPLICATION OF CRANIAL NAVIGATION Right 01/17/2021   Procedure: APPLICATION OF CRANIAL NAVIGATION;  Surgeon: Alan Gelinas, MD;  Location: [facility] OR;  Service: Neurosurgery;  Laterality: Right;   COLONOSCOPY  09/03/1994   COLONOSCOPY WITH PROPOFOL  N/A 01/07/2023   Procedure: COLONOSCOPY WITH PROPOFOL ;  Surgeon: Alan Sink, MD;  Location: [facility] ENDOSCOPY;  Service: Endoscopy;  Laterality: N/A;  NOT TOO EARLY   CYSTOSCOPY WITH STENT PLACEMENT Right 01/25/2016   Procedure: CYSTOSCOPY WITH STENT PLACEMENT;  Surgeon: Alan Born, MD;  Location: [facility] ORS;  Service: Urology;  Laterality: Right;   FRAMELESS  BIOPSY WITH BRAINLAB Right 01/17/2021   Procedure: RIGHT STEREOTACTIC BIOPSY OF INSULAR LESION;  Surgeon: Alan Gelinas, MD;  Location[facility] OR;  Service: Neurosurgery;  Laterality: Right;   KNEE ARTHROSCOPY W/ MENISCAL REPAIR Right 06/23/1988   POLYPECTOMY  01/07/2023   Procedure: POLYPECTOMY;  Surgeon: Alan Sink, MD;  Location: [facility]  ENDOSCOPY;  Service: Endoscopy;;   removal of birthmark  06/08/2013   SKIN CANCER EXCISION  06/23/2012   TYMPANOSTOMY TUBE PLACEMENT  08/06/1978   URETEROSCOPY WITH HOLMIUM LASER LITHOTRIPSY Right 01/25/2016   Procedure: URETEROSCOPY WITH HOLMIUM LASER LITHOTRIPSY;  Surgeon: Alan Born, MD;  Location: [facility] ORS;  Service: Urology;  Laterality: Right;   URETHRAL STRICTURE DILATATION     visual inspection of vocal cord     1973   WISDOM TOOTH EXTRACTION      SOCIAL HISTORY:   Social History   Tobacco Use   Smoking status: Never   Smokeless tobacco: Never  Substance Use Topics   Alcohol use: Not Currently    Comment: rare    FAMILY HISTORY:   Family History  Problem Relation Age of Onset   Hyperlipidemia Father    Hypertension Father    Diabetes Father    Benign prostatic hyperplasia Father    Stroke Maternal Grandmother    Diabetes Maternal Grandmother    Stroke Paternal Grandmother    Hypertension Paternal Grandmother    Kidney disease Neg Hx    Prostate cancer Neg Hx     DRUG ALLERGIES:   Allergies  Allergen Reactions   Contrast Media [Iodinated Contrast Media] Swelling    REVIEW OF SYSTEMS:   ROS As per history of present illness. All pertinent systems were reviewed above. Constitutional, HEENT, cardiovascular, respiratory, GI, GU, musculoskeletal, neuro, psychiatric, endocrine, integumentary and hematologic systems were reviewed and are otherwise negative/unremarkable except for positive findings mentioned above in the HPI.   MEDICATIONS AT HOME:   Prior to Admission medications   Medication Sig Start Date End Date Taking? Authorizing Provider  acetaminophen  (TYLENOL ) 325 MG tablet Take 2 tablets (650 mg total) by mouth every 6 (six) hours as needed for mild pain (pain score 1-3) (or Fever >/= 101). 09/19/23   Gonfa, Taye T, MD  amLODipine  (NORVASC ) 5 MG tablet TAKE 1 TABLET (5 MG TOTAL) BY MOUTH DAILY. 11/05/23   Vaslow, Zachary K, MD  ASPIRIN  LOW  DOSE 81 MG tablet Take 81 mg by mouth daily. 10/05/23   [provider]  atorvastatin  (LIPITOR) 40 MG tablet Take 1 tablet (40 mg total) by mouth daily. Patient not taking: Reported on 11/17/2023 09/24/23   Angiulli, Everlyn Hockey, PA-C  cholecalciferol  (VITAMIN D3) 25 MCG (1000 UNIT) tablet Take 1 tablet (1,000 Units total) by mouth daily. Patient not taking: Reported on 11/17/2023 09/24/23   Angiulli, Everlyn Hockey, PA-C  cyanocobalamin  1000 MCG tablet Take 1 tablet (1,000 mcg total) by mouth daily. Patient not taking: Reported on 11/17/2023 09/24/23   Angiulli, Everlyn Hockey, PA-C  diazePAM , 15 MG Dose, 2 x 7.5 MG/0.1ML LQPK Place 15 mg into the nose once as needed for up to 1 dose (seizure). Patient not taking: Reported on 12/07/2023 09/25/23   Angiulli, Everlyn Hockey, PA-C  divalproex  (DEPAKOTE ) 500 MG DR tablet Take 1 tablet (500 mg total) by mouth every 8 (eight) hours. Patient not taking: Reported on 12/07/2023 09/24/23   Angiulli, Everlyn Hockey, PA-C  lamoTRIgine  (LAMICTAL ) 150 MG tablet Take 150 mg by mouth 2 (two) times daily. 11/16/23   [provider]  losartan  (COZAAR ) 25 MG tablet Take 1 tablet (25 mg total) by mouth daily. Patient not taking: Reported on 12/07/2023 09/24/23   Sterling Eisenmenger, PA-C  propranolol  (INDERAL ) 20 MG tablet TAKE 1 TABLET BY MOUTH TWICE A DAY 11/17/23   Vaslow, Zachary K, MD      VITAL SIGNS:  Blood pressure 112/60, pulse 76, temperature 98.3 F (36.8 C), temperature source Oral, resp. rate (!) 23, SpO2 92%.  PHYSICAL EXAMINATION:  Physical Exam  GENERAL:  53 y.o.-year-old patient lying in the bed with no acute distress.  EYES: Pupils equal, round, reactive to light and accommodation. No scleral icterus. Extraocular muscles intact.  HEENT: Head atraumatic, normocephalic. Oropharynx and nasopharynx clear.  NECK:  Supple, no jugular venous distention. No thyroid  enlargement, no tenderness.  LUNGS: Normal breath sounds bilaterally, no wheezing, rales,rhonchi or crepitation.  No use of accessory muscles of respiration.  CARDIOVASCULAR: Regular rate and rhythm, S1, S2 normal. No murmurs, rubs, or gallops.  ABDOMEN: Soft, nondistended, nontender. Bowel sounds present. No organomegaly or mass.  EXTREMITIES: No pedal edema, cyanosis, or clubbing.  NEUROLOGIC: Cranial nerves II through XII are intact except for slurred speech and mild expressive dysphasia. Muscle strength 5/5 in all extremities. Sensation intact. Gait not checked.  PSYCHIATRIC: The patient is alert and oriented x 3.  Normal affect and good eye contact. SKIN: No obvious rash, lesion, or ulcer.   LABORATORY PANEL:   CBC Recent Labs  Lab 12/09/23 0427  WBC 11.9*  HGB 12.9*  HCT 36.7*  PLT 131*   ------------------------------------------------------------------------------------------------------------------  Chemistries  Recent Labs  Lab 12/08/23 2059 12/09/23 0427  NA 133* 134*  K 4.0 3.8  CL 93* 101  CO2 27 26  GLUCOSE 139* 117*  BUN 19 17  CREATININE 0.91 0.77  CALCIUM  9.4 8.0*  AST 22  --   ALT 19  --   ALKPHOS 77  --   BILITOT 1.6*  --    ------------------------------------------------------------------------------------------------------------------  Cardiac Enzymes No results for input(s): TROPONINI in the last 168 hours. ------------------------------------------------------------------------------------------------------------------  RADIOLOGY:  MR Brain W and Wo Contrast Result Date: 12/09/2023 CLINICAL DATA:  Delirium had MRI about 1 week ago, since then has had increased difficulty speaking, R leg weakness, increased somnolence. eval for stroke, edema, tumor progression etc EXAM: MRI HEAD WITHOUT AND WITH CONTRAST TECHNIQUE: Multiplanar, multiecho pulse sequences of the brain and surrounding structures were obtained without and with intravenous contrast. CONTRAST:  6mL GADAVIST  GADOBUTROL  1 MMOL/ML IV SOLN COMPARISON:  MRI head December 03, 2023. FINDINGS: Brain: New  punctate focus of restricted diffusion in the anterior right temporal lobe (series 5, image 28). Resolution of the previously seen small focus of restricted diffusion in the right corona radiata. Additional areas of masslike restricted diffusion and signal abnormality involving the right frontal lobe, genu of the corpus callosum extending into the left frontal lobe, and right periatrial region, compatible with known tumor. More facet areas of T1 hyperintensity in the right frontal lobe right periatrial region are unchanged. No substantial mass effect or midline shift. No hydrocephalus. Similar cerebral atrophy. Vascular: Major arterial flow voids are maintained. Skull and upper cervical spine: Normal marrow signal. Sinuses/Orbits: Negative. IMPRESSION: 1. New punctate focus of restricted diffusion in the anterior right temporal lobe could represent a small acute infarct (favored) or new/progressive small site of tumor. 2. Otherwise, unchanged appearance of multifocal signal abnormality  restricted diffusion in the right frontal and right temporal lobes and corpus callosum, compatible with known tumor. Electronically Signed   By: Stevenson Elbe M.D.   On: 12/09/2023 01:17   CT Head Wo Contrast Result Date: 12/08/2023 CLINICAL DATA:  Delirium altered EXAM: CT HEAD WITHOUT CONTRAST TECHNIQUE: Contiguous axial images were obtained from the base of the skull through the vertex without intravenous contrast. RADIATION DOSE REDUCTION: This exam was performed according to the departmental dose-optimization program which includes automated exposure control, adjustment of the mA and/or kV according to patient size and/or use of iterative reconstruction technique. COMPARISON:  MRI 12/03/2023, CT brain 09/09/2023 FINDINGS: Brain: Amorphous hyperdensity in the right frontal and temporal lobes, progressive compared with head CT from March and favored to represent mineralization over hemorrhage, and corresponding to history of  known brain lesions. White matter hypodensity again visible in the right frontal and temporal lobes as well as right insula. Areas of cystic encephalomalacia as before. Grossly stable size and configuration of the ventricles with ex vacuo dilatation on the right. Vascular: No hyperdense vessels.  Carotid vascular calcification Skull: No fracture. Sinuses/Orbits: No acute finding. Mild mucosal thickening in the sinuses Other: None IMPRESSION: Amorphous hyperdensity in the right frontal and temporal lobes, progressive compared with head CT from March and favored to represent mineralization over hemorrhage, and corresponding to history of known brain lesions. Other grossly stable areas of white matter hypodensity as demonstrated on recent MRI imaging. Stable ventricle size and morphology compared to prior. Electronically Signed   By: Esmeralda Hedge M.D.   On: 12/08/2023 21:58   DG Chest Portable 1 View Result Date: 12/08/2023 CLINICAL DATA:  Altered mental status. EXAM: PORTABLE CHEST 1 VIEW COMPARISON:  September 09, 2023 FINDINGS: The heart size and mediastinal contours are within normal limits. Both lungs are clear. The visualized skeletal structures are unremarkable. IMPRESSION: No active disease. Electronically Signed   By: Virgle Grime M.D.   On: 12/08/2023 21:30      IMPRESSION AND PLAN:  Assessment and Plan: * Sepsis due to gram-negative UTI (HCC) - This is manifested by fever, tachycardia and tachypnea. - The patient be admitted to a severe bed. - Will continue antibiotic therapy with IV Rocephin and follow blood and urine culture and sensitivity. - Will continue hydration with IV lactated ringer .  Abnormal brain MRI - He has suspected right temporal lobe infarct versus tumor. - Neurology consult to be obtained.  I notified Dr. Doretta Gant about patient - Will follow neurochecks. - PT/OT eval will be obtained. - Will continue on aspirin  and statin therapy. - Fasting lipids will be  obtained.  Seizure disorder (HCC) - Will continue with Lamictal , Depakote  and diazepam .  Dyslipidemia - Will continue statin therapy and check fasting lipids as mentioned above.  Essential hypertension - Will continue antihypertensive therapy.    DVT prophylaxis: Lovenox .  Advanced Care Planning:  Code Status: full code.  Family Communication:  The plan of care was discussed in details with the patient (and family). I answered all questions. The patient agreed to proceed with the above mentioned plan. Further management will depend upon hospital course. Disposition Plan: Back to previous home environment Consults called: Neurology All the records are reviewed and case discussed with ED provider.  Status is: Inpatient    At the time of the admission, it appears that the appropriate admission status for this patient is inpatient.  This is judged to be reasonable and necessary in order to provide the required intensity of  service to ensure the patient's safety given the presenting symptoms, physical exam findings and initial radiographic and laboratory data in the context of comorbid conditions.  The patient requires inpatient status due to high intensity of service, high risk of further deterioration and high frequency of surveillance required.  I certify that at the time of admission, it is my clinical judgment that the patient will require inpatient hospital care extending more than 2 midnights.                            Dispo: The patient is from: Home              Anticipated d/c is to: Home              Patient currently is not medically stable to d/c.              Difficult to place patient: No  Virgene Griffin M.D on 12/09/2023 at 7:30 AM  [facility]   From 7 PM-7 AM, contact night-coverage [email or web address]  CC: Primary care physician; Alan Cirri, NP

## 2023-12-09 NOTE — Assessment & Plan Note (Signed)
-   Will continue with Lamictal , Depakote  and diazepam .

## 2023-12-09 NOTE — Progress Notes (Signed)
  PROGRESS NOTE    Alan Wise  ZOX:[id number] DOB: [birth year] DOA: 12/08/2023 PCP: Carollynn Cirri, NP  [id number]  LOS: 0 days   Brief hospital course:   Assessment & Plan: Alan Wise is a 53 y.o. Caucasian male with medical history significant for oligodendroglioma, GERD, type 2 diabetes mellitus, dyslipidemia and urolithiasis, presented to the emergency room with acute onset of altered mental status and recently diminished appetite over the last 4 days.  He saw his PCP earlier this week and had an MRI as he has been having slowly increasing weakness and difficulty with his speech which has been more difficult to understand.  He has not been ambulating as usual.  He has been more fatigued and tired and somnolent.  He denies any nausea or vomiting or abdominal pain.  No fever or chills.  No dysuria, oliguria or hematuria or flank pain.  No worsening paresthesias or focal muscle weakness.  No tinnitus or vertigo.  No dysphagia.  He has been unsteady with his gait.    * Sepsis due to gram-negative UTI (HCC) - This is manifested by fever, tachycardia and tachypnea. --cont ceftriaxone  Abnormal brain MRI - He has suspected right temporal lobe infarct versus tumor. --neuro consult  Seizure disorder (HCC) --not taking depakote  and diazepam  PTA --cont Lamictal   Dyslipidemia --not taking Lipitor PTA  Essential hypertension --not taking losartan  PTA --cont amlodipine  and propranolol    DVT prophylaxis: Lovenox  SQ Code Status: Full code  Family Communication: mother updated at bedside today Level of care: Med-Surg Dispo:   The patient is from: home Anticipated d/c is to: home Anticipated d/c date is: tomorrow   Subjective and Interval History:  No dysuria.  RN reported pt aspirated while eating a hamburger, but able to cough and clear on his own.   Objective: Vitals:   12/09/23 1030 12/09/23 1345 12/09/23 1412 12/09/23 1420  BP: 111/74  130/80   Pulse: 73 74  72   Resp: (!) 26 (!) 24 18   Temp:   98.5 F (36.9 C)   TempSrc:      SpO2:  93% 93%   Weight:    66.8 kg  Height:    5' 8 (1.727 m)    Intake/Output Summary (Last 24 hours) at 12/09/2023 1920 Last data filed at 12/09/2023 1600 Gross per 24 hour  Intake 0 ml  Output --  Net 0 ml   Filed Weights   12/09/23 1420  Weight: 66.8 kg    Examination:   Constitutional: NAD, alert, oriented CV: No cyanosis.   RESP: normal respiratory effort, on RA Neuro: II - XII grossly intact.     Data Reviewed: I have personally reviewed labs and imaging studies  No charge note.  Garrison Kanner, MD [facility] If 7PM-7AM, please contact night-coverage 12/09/2023, 7:20 PM

## 2023-12-10 ENCOUNTER — Inpatient Hospital Stay

## 2023-12-10 ENCOUNTER — Encounter: Payer: Self-pay | Admitting: Occupational Therapy

## 2023-12-10 ENCOUNTER — Ambulatory Visit: Admitting: Occupational Therapy

## 2023-12-10 DIAGNOSIS — N390 Urinary tract infection, site not specified: Secondary | ICD-10-CM | POA: Diagnosis not present

## 2023-12-10 DIAGNOSIS — A4150 Gram-negative sepsis, unspecified: Secondary | ICD-10-CM | POA: Diagnosis not present

## 2023-12-10 LAB — CBC
HCT: 38.2 % — ABNORMAL LOW (ref 39.0–52.0)
Hemoglobin: 12.9 g/dL — ABNORMAL LOW (ref 13.0–17.0)
MCH: 28.7 pg (ref 26.0–34.0)
MCHC: 33.8 g/dL (ref 30.0–36.0)
MCV: 85.1 fL (ref 80.0–100.0)
Platelets: 146 10*3/uL — ABNORMAL LOW (ref 150–400)
RBC: 4.49 MIL/uL (ref 4.22–5.81)
RDW: 12.3 % (ref 11.5–15.5)
WBC: 10.2 10*3/uL (ref 4.0–10.5)
nRBC: 0 % (ref 0.0–0.2)

## 2023-12-10 LAB — BASIC METABOLIC PANEL WITH GFR
Anion gap: 8 (ref 5–15)
BUN: 18 mg/dL (ref 6–20)
CO2: 26 mmol/L (ref 22–32)
Calcium: 8.8 mg/dL — ABNORMAL LOW (ref 8.9–10.3)
Chloride: 105 mmol/L (ref 98–111)
Creatinine, Ser: 0.8 mg/dL (ref 0.61–1.24)
GFR, Estimated: >60 mL/min (ref >60)
Glucose, Bld: 108 mg/dL — ABNORMAL HIGH (ref 70–99)
Potassium: 4.2 mmol/L (ref 3.5–5.1)
Sodium: 139 mmol/L (ref 135–145)

## 2023-12-10 LAB — MAGNESIUM: Magnesium: 2.1 mg/dL (ref 1.7–2.4)

## 2023-12-10 LAB — URINE CULTURE: Culture: NO GROWTH

## 2023-12-10 NOTE — Plan of Care (Signed)
  Problem: Fluid Volume: Goal: Hemodynamic stability will improve Outcome: Progressing   Problem: Clinical Measurements: Goal: Diagnostic test results will improve Outcome: Progressing   Problem: Respiratory: Goal: Ability to maintain adequate ventilation will improve Outcome: Progressing   Problem: Education: Goal: Knowledge of disease or condition will improve Outcome: Progressing   Problem: Ischemic Stroke/TIA Tissue Perfusion: Goal: Complications of ischemic stroke/TIA will be minimized Outcome: Progressing   Problem: Coping: Goal: Will verbalize positive feelings about self Outcome: Progressing   Problem: Health Behavior/Discharge Planning: Goal: Ability to manage health-related needs will improve Outcome: Progressing   Problem: Nutrition: Goal: Risk of aspiration will decrease Outcome: Progressing   Problem: Education: Goal: Knowledge of General Education information will improve Description: Including pain rating scale, medication(s)/side effects and non-pharmacologic comfort measures Outcome: Progressing   Problem: Clinical Measurements: Goal: Ability to maintain clinical measurements within normal limits will improve Outcome: Progressing   Problem: Activity: Goal: Risk for activity intolerance will decrease Outcome: Progressing   Problem: Nutrition: Goal: Adequate nutrition will be maintained Outcome: Progressing   Problem: Coping: Goal: Level of anxiety will decrease Outcome: Progressing   Problem: Elimination: Goal: Will not experience complications related to bowel motility Outcome: Progressing   Problem: Pain Managment: Goal: General experience of comfort will improve and/or be controlled Outcome: Progressing   Problem: Safety: Goal: Ability to remain free from injury will improve Outcome: Progressing   Problem: Skin Integrity: Goal: Risk for impaired skin integrity will decrease Outcome: Progressing

## 2023-12-10 NOTE — Evaluation (Addendum)
 Clinical/Bedside Swallow Evaluation Patient Details  Name: Alan Wise MRN: [id number] Date of Birth: [birth year]  Today's Date: 12/10/2023 Time: SLP Start Time (ACUTE ONLY): 0850 SLP Stop Time (ACUTE ONLY): 0920 SLP Time Calculation (min) (ACUTE ONLY): 30 min  Past Medical History:  Past Medical History:  Diagnosis Date   Allergy    Complication of anesthesia    pt reports he is starting to have more difficulty arousing after surgery   Diabetes mellitus (HCC)    GERD (gastroesophageal reflux disease)    Headache    History of kidney stones    Hyperlipidemia    Renal disorder    kidney stones   Past Surgical History:  Past Surgical History:  Procedure Laterality Date   APPLICATION OF CRANIAL NAVIGATION Right 01/17/2021   Procedure: APPLICATION OF CRANIAL NAVIGATION;  Surgeon: Van Gelinas, MD;  Location: [facility] OR;  Service: Neurosurgery;  Laterality: Right;   COLONOSCOPY  09/03/1994   COLONOSCOPY WITH PROPOFOL  N/A 01/07/2023   Procedure: COLONOSCOPY WITH PROPOFOL ;  Surgeon: Marnee Sink, MD;  Location: [facility] ENDOSCOPY;  Service: Endoscopy;  Laterality: N/A;  NOT TOO EARLY   CYSTOSCOPY WITH STENT PLACEMENT Right 01/25/2016   Procedure: CYSTOSCOPY WITH STENT PLACEMENT;  Surgeon: Bart Born, MD;  Location: [facility] ORS;  Service: Urology;  Laterality: Right;   FRAMELESS  BIOPSY WITH BRAINLAB Right 01/17/2021   Procedure: RIGHT STEREOTACTIC BIOPSY OF INSULAR LESION;  Surgeon: Van Gelinas, MD;  Location: [facility] OR;  Service: Neurosurgery;  Laterality: Right;   KNEE ARTHROSCOPY W/ MENISCAL REPAIR Right 06/23/1988   POLYPECTOMY  01/07/2023   Procedure: POLYPECTOMY;  Surgeon: Marnee Sink, MD;  Location: [facility] ENDOSCOPY;  Service: Endoscopy;;   removal of birthmark  06/08/2013   SKIN CANCER EXCISION  06/23/2012   TYMPANOSTOMY TUBE PLACEMENT  08/06/1978   URETEROSCOPY WITH HOLMIUM LASER LITHOTRIPSY Right 01/25/2016   Procedure: URETEROSCOPY WITH HOLMIUM LASER LITHOTRIPSY;   Surgeon: Bart Born, MD;  Location: [facility] ORS;  Service: Urology;  Laterality: Right;   URETHRAL STRICTURE DILATATION     visual inspection of vocal cord     1973   WISDOM TOOTH EXTRACTION     HPI:  Per MD progress note, Alan Wise is a 53 y.o. Caucasian male with medical history significant for oligodendroglioma, GERD, type 2 diabetes mellitus, dyslipidemia and urolithiasis, presented to the emergency room with acute onset of altered mental status and recently diminished appetite over the last 4 days.  He saw his PCP earlier this week and had an MRI as he has been having slowly increasing weakness and difficulty with his speech which has been more difficult to understand.  He has not been ambulating as usual.  He has been more fatigued and tired and somnolent. MRI 12/09/23: New punctate focus of restricted diffusion in the anterior right  temporal lobe could represent a small acute infarct (favored) or  new/progressive small site of tumor.  2. Otherwise, unchanged appearance of multifocal signal abnormality  restricted diffusion in the right frontal and right temporal lobes  and corpus callosum, compatible with known tumor. CXR 6/17: No active disease.    Assessment / Plan / Recommendation  Clinical Impression  Pt seen for bedside swallow assessment in the setting of acute CVA. Pt with history of dysphagia, with most recent MBSS completed on 09/16/23, revealing pharyngoesophageal and suspected esophageal dysphagia. During this admission, pt with frequent, though inconsistent, instances on choking on solids. Pt demonstrating awareness and application of aspiration precautions (specifically regarding upright  positioning). Self feeding completed for thin liquids, puree, and regular solids. Immediate cough and delayed throat clear noted with solids- no s/sx of aspiration with other consistencies. Oral phase grossly intact. Belching noted during trials, likely related to known esophageal  factors.   Given hx of dysphagia, and concern for further impairment, recommend completion of MBSS. MD and RN in agreement with plan.   In the mean time - recommend Dys 2 (chopped) and thin liquids with aspiration precautions (slow rate, small bites, elevated HOB, alert for PO intake, alternating solids and liquids). Pt and spouse reported understanding and agreement with plan.   Regarding linguistic ability- pt/spouse report continued fluctuations in aphasia based on fatigue- education shared regarding likely impact of hospital course and sleep patterns on language skills. Wife endorsed that current presentation is approximating baseline.   SLP Visit Diagnosis: Dysphagia, unspecified (R13.10)    Aspiration Risk  Moderate aspiration risk    Diet Recommendation   Dysphagia 2 (chopped);Thin  Medication Administration: Whole meds with puree (vs with thins)    Other  Recommendations Oral Care Recommendations: Oral care BID     Assistance Recommended at Discharge  Supervision for aspiration precautions   Functional Status Assessment Patient has had a recent decline in their functional status and demonstrates the ability to make significant improvements in function in a reasonable and predictable amount of time.  Frequency and Duration     Defer until completion of MBSS       Prognosis Prognosis for improved oropharyngeal function:  (defer until completion of MBSS) Barriers to Reach Goals: Time post onset;Language deficits      Swallow Study   General Date of Onset: 12/10/23 HPI: Per MD progress note, Alan Wise is a 53 y.o. Caucasian male with medical history significant for oligodendroglioma, GERD, type 2 diabetes mellitus, dyslipidemia and urolithiasis, presented to the emergency room with acute onset of altered mental status and recently diminished appetite over the last 4 days.  He saw his PCP earlier this week and had an MRI as he has been having slowly increasing weakness  and difficulty with his speech which has been more difficult to understand.  He has not been ambulating as usual.  He has been more fatigued and tired and somnolent. MRI 12/09/23: New punctate focus of restricted diffusion in the anterior right  temporal lobe could represent a small acute infarct (favored) or  new/progressive small site of tumor.  2. Otherwise, unchanged appearance of multifocal signal abnormality  restricted diffusion in the right frontal and right temporal lobes  and corpus callosum, compatible with known tumor. CXR 6/17: No active disease. Type of Study: Bedside Swallow Evaluation Previous Swallow Assessment: last BSE completed 09/15/23, last MBSS 09/16/23: revealing pharyngoesophageal and suspected esophageal dysphagia Diet Prior to this Study: Regular;Thin liquids (Level 0) Temperature Spikes Noted: No Respiratory Status: Room air History of Recent Intubation: No Behavior/Cognition: Alert;Cooperative Oral Cavity Assessment: Within Functional Limits Oral Care Completed by SLP: No (pt eating breakfast upon therapist arrival) Oral Cavity - Dentition: Adequate natural dentition Vision: Functional for self-feeding Self-Feeding Abilities: Able to feed self;Needs set up Patient Positioning: Upright in bed Baseline Vocal Quality: Normal Volitional Cough: Strong Volitional Swallow: Able to elicit    Oral/Motor/Sensory Function Overall Oral Motor/Sensory Function: Within functional limits   Ice Chips Ice chips: Not tested   Thin Liquid Thin Liquid: Within functional limits Presentation: Cup;Straw;Self Fed    Nectar Thick Nectar Thick Liquid: Not tested   Honey Thick Honey Thick Liquid: Not  tested   Puree Puree: Within functional limits Presentation: Self Fed;Spoon   Solid     Solid: Impaired Presentation: Self Fed;Spoon Oral Phase Impairments:  (grossly intact) Oral Phase Functional Implications:  (grossly intact) Pharyngeal Phase Impairments: Throat Clearing -  Delayed;Cough - Immediate     [place] Justise Ehmann Clapp, MS, CCC-SLP Speech Language Pathologist Rehab Services; [facility] [phone] (ascom)   [place] J Clapp 12/10/2023,11:32 AM

## 2023-12-10 NOTE — Therapy (Signed)
 [facility] [address] [place], [place], [address] Phone: [phone]   Fax:  [phone]  Patient Details  Name: Alan Wise MRN: [id number] Date of Birth: Nov 29, 1970  Encounter Date: 12/10/2023  Therapy team informed yesterday that pt had been admitted to the hospital.  This OT was going to write a discharge but upon review of medical records, pt is going home today and therefore, we will plan to resume OT and recheck appropriateness of goals upon receipt of resume orders.  Zora Hires, OT 12/10/2023, 4:12 PM

## 2023-12-10 NOTE — Progress Notes (Signed)
 Modified Barium Swallow Study  Patient Details  Name: Alan Wise MRN: [id number] Date of Birth: 24-Feb-1971  Today's Date: 12/10/2023  Modified Barium Swallow completed.  Full report located under Chart Review in the Imaging Section.  History of Present Illness Per MD progress note, Alan Wise is a 53 y.o. Caucasian male with medical history significant for oligodendroglioma, GERD, type 2 diabetes mellitus, dyslipidemia and urolithiasis, presented to the emergency room with acute onset of altered mental status and recently diminished appetite over the last 4 days.  He saw his PCP earlier this week and had an MRI as he has been having slowly increasing weakness and difficulty with his speech which has been more difficult to understand.  He has not been ambulating as usual.  He has been more fatigued and tired and somnolent. MRI 12/09/23: New punctate focus of restricted diffusion in the anterior right  temporal lobe could represent a small acute infarct (favored) or  new/progressive small site of tumor.  2. Otherwise, unchanged appearance of multifocal signal abnormality  restricted diffusion in the right frontal and right temporal lobes  and corpus callosum, compatible with known tumor. CXR 6/17: No active disease.   Clinical Impression Pt seen for MBSS demonstrating mild pharyngoesophageal phase dysphagia. Pt presents with improvement in pharyngeal/esophageal phase of swallow in comparison with MBSS completed in March 2025. This date pt with continued residue at the UES and reduced distention of UES coupled with retention in the esophagus for more viscous substances- though no retrograde movement. Residue spilling into pyriform sinus from the UES space, though not continuing to the laryngeal vestibule. No overt airway compromise across consistencies. Cough noted x1 without evidence of penetration/aspiration. Oral phase grossly intact.   Despite lack of aspiration during study- given  known choking and mild deficits related to UES distention/residue and reflux/retention- pt is at increased risk for aspiration, specifically with substances of increased viscosity. Trials of liquid wash and double swallow aided to reduce residue.   Continue to recommend dys 2 (chopped) solids to aid clearance and use of compensatory strategies- double swallow and liquid wash. Recommend aspiration precautions- slow rate, small bites, elevated HOB (for at least 30 min after swallow), and alert for PO intake. Pt and spouse aware of recommendations. RN and MD notified of results. Recommend follow up with OP SLP services (pt is currently on OP caseload).   Factors that may increase risk of adverse event in presence of aspiration Roderick Civatte & Jessy Morocco 2021): Frail or deconditioned;Limited mobility (min deconditioned)  Swallow Evaluation Recommendations Recommendations: PO diet PO Diet Recommendation: Dysphagia 2 (Finely chopped);Thin liquids (Level 0) Liquid Administration via: Cup;Straw Medication Administration: Whole meds with liquid Supervision: Patient able to self-feed;Intermittent supervision/cueing for swallowing strategies Swallowing strategies  : Slow rate;Small bites/sips;Follow solids with liquids Postural changes: Position pt fully upright for meals;Stay upright 30-60 min after meals Oral care recommendations: Oral care BID (2x/day)     [place] Cammi Consalvo Clapp, MS, CCC-SLP Speech Language Pathologist Rehab Services; [facility] [phone] (ascom)   [place] J Clapp 12/10/2023,12:37 PM

## 2023-12-10 NOTE — Discharge Summary (Signed)
 Physician Discharge Summary   Alan Wise  male DOB: 10/31/1970  FMW:[id number]  PCP: Alan Wise, Alan Wise  Admit date: 12/08/2023 Discharge date: 12/10/2023  Admitted From: home Disposition:  home Wife updated at bedside prior to discharge. Home Health: Yes CODE STATUS: Full code   Hospital Course:  For full details, please see H&P, progress notes, consult notes and ancillary notes.  Briefly,  Alan Wise is a 53 y.o. Caucasian male with medical history significant for oligodendroglioma, type 2 diabetes mellitus, dyslipidemia and urolithiasis, presented to the emergency room with acute onset of altered mental status and recently diminished appetite over the last 4 days.    He saw his PCP earlier this week and had an MRI as he has been having slowly increasing weakness and difficulty with his speech which has been more difficult to understand.  He has not been ambulating as usual.  He has been more fatigued and tired and somnolent.  No worsening paresthesias or focal muscle weakness.  He has been unsteady with his gait.    AMS --unclear etiology.  UDS pos for amphetamines, however, pt denied using any drugs.  Mental status returned to baseline prior to discharge.  Abnormal brain MRI --MRI brain showed New punctate focus of restricted diffusion in the anterior right temporal lobe.   --neuro consulted, rec no change in management. --f/u with outpatient neuro   Ruled out sepsis  Ruled out UTI --No dysuria, hematuria or flank pain.  Urine cx neg growth. --abx not continued  Seizure disorder (HCC) --not taking depakote  and diazepam  PTA --cont home Lamictal    Dyslipidemia --not taking Lipitor PTA   Essential hypertension --not taking losartan  PTA --cont amlodipine  and propranolol    Unless noted above, medications under STOP list are ones pt was not taking PTA.  Discharge Diagnoses:  Principal Problem:   Sepsis due to gram-negative UTI Alan Wise) Active  Problems:   Seizure disorder (HCC)   Abnormal brain MRI   Essential hypertension   Dyslipidemia   30 Day Unplanned Readmission Risk Score    Flowsheet Row ED to Hosp-Admission (Current) from 12/08/2023 in Alan [facility] 1[facility] MEDICAL TELEMETRY  30 Day Unplanned Readmission Risk Score (%) 18.8 Filed at 12/10/2023 1200    This score is the patient's risk of an unplanned readmission within 30 days of being discharged (0 -100%). The score is based on dignosis, age, lab data, medications, orders, and past utilization.   Low:  0-14.9   Medium: 15-21.9   High: 22-29.9   Extreme: 30 and above         Discharge Instructions:  Allergies as of 12/10/2023       Reactions   Contrast Media [iodinated Contrast Media] Swelling        Medication List     STOP taking these medications    atorvastatin  40 MG tablet Commonly known as: Lipitor   cholecalciferol  25 MCG (1000 UNIT) tablet Commonly known as: VITAMIN D3   cyanocobalamin  1000 MCG tablet   divalproex  500 MG DR tablet Commonly known as: DEPAKOTE    losartan  25 MG tablet Commonly known as: COZAAR    Valtoco  15 MG Dose 2 x 7.5 MG/0.1ML Lqpk Generic drug: diazePAM  (15 MG Dose)       TAKE these medications    acetaminophen  325 MG tablet Commonly known as: TYLENOL  Take 2 tablets (650 mg total) by mouth every 6 (six) hours as needed for mild pain (pain score 1-3) (or Fever >/= 101).   amLODipine   5 MG tablet Commonly known as: NORVASC  TAKE 1 TABLET (5 MG TOTAL) BY MOUTH DAILY.   Aspirin  Low Dose 81 MG tablet Generic drug: aspirin  EC Take 81 mg by mouth daily.   lamoTRIgine  150 MG tablet Commonly known as: LAMICTAL  Take 150 mg by mouth 2 (two) times daily.   propranolol  20 MG tablet Commonly known as: INDERAL  TAKE 1 TABLET BY MOUTH TWICE A DAY         Follow-up Information     Alan Wise, Alan Wise Follow up.   Specialties: Internal Medicine, Emergency Medicine Why: hospital follow up Contact  information: [address] [place] [phone]                 Allergies  Allergen Reactions   Contrast Media [Iodinated Contrast Media] Swelling     The results of significant diagnostics from this hospitalization (including imaging, microbiology, ancillary and laboratory) are listed below for reference.   Consultations:   Procedures/Studies: DG Swallowing Func-Speech Pathology Result Date: 12/10/2023 Table formatting from the original result was not included. Modified Barium Swallow Study Patient Details Name: Alan Wise MRN: [id number] Date of Birth: [birth year] Today's Date: 12/10/2023 HPI/PMH: HPI: Per MD progress note, Alan Wise is a 53 y.o. Caucasian male with medical history significant for oligodendroglioma, GERD, type 2 diabetes mellitus, dyslipidemia and urolithiasis, presented to the emergency room with acute onset of altered mental status and recently diminished appetite over the last 4 days.  He saw his PCP earlier this week and had an MRI as he has been having slowly increasing weakness and difficulty with his speech which has been more difficult to understand.  He has not been ambulating as usual.  He has been more fatigued and tired and somnolent. MRI 12/09/23: New punctate focus of restricted diffusion in the anterior right  temporal lobe could represent a small acute infarct (favored) or  new/progressive small site of tumor.  2. Otherwise, unchanged appearance of multifocal signal abnormality  restricted diffusion in the right frontal and right temporal lobes  and corpus callosum, compatible with known tumor. CXR 6/17: No active disease. Clinical Impression: Clinical Impression: Pt seen for MBSS demonstrating mild pharyngoesophageal phase dysphagia. Pt presents with improvement in pharyngeal/esophageal phase of swallow in comparison with MBSS completed in March 2025. This date pt with continued residue at the UES and reduced distention of UES  coupled with retention in the esophagus for more viscous substances- though no retrograde movement. Residue spilling into pyriform sinus from the UES space, though not continuing to the laryngeal vestibule. No overt airway compromise across consistencies. Cough noted x1 without evidence of penetration/aspiration. Oral phase grossly intact.     Despite lack of aspiration during study- given known choking and mild deficits related to UES distention/residue and reflux/retention- pt is at increased risk for aspiration, specifically with substances of increased viscosity. Trials of liquid wash and double swallow aided to reduce residue.     Continue to recommend dys 2 (chopped) solids to aid clearance and use of compensatory strategies- double swallow and liquid wash. Recommend aspiration precautions- slow rate, small bites, elevated HOB (for at least 30 min after swallow), and alert for PO intake. Pt and spouse aware of recommendations. RN and MD notified of results. Recommend follow up with OP SLP services (pt is currently on OP caseload). Factors that may increase risk of adverse event in presence of aspiration [name] & [name] 2021): Factors that may increase risk of adverse event in  presence of aspiration [name] & [name] 2021): Frail or deconditioned; Limited mobility (min deconditioned) Recommendations/Plan: Swallowing Evaluation Recommendations Swallowing Evaluation Recommendations Recommendations: PO diet PO Diet Recommendation: Dysphagia 2 (Finely chopped); Thin liquids (Level 0) Liquid Administration via: Cup; Straw Medication Administration: Whole meds with liquid Supervision: Patient able to self-feed; Intermittent supervision/cueing for swallowing strategies Swallowing strategies  : Slow rate; Small bites/sips; Follow solids with liquids Postural changes: Position pt fully upright for meals; Stay upright 30-60 min after meals Oral care recommendations: Oral care BID (2x/day) Treatment Plan Treatment Plan  Treatment recommendations: Defer treatment plan to SLP at other venue (see follow-up recommendations) (follow up with OP SLP) Follow-up recommendations: Follow physicians's recommendations for discharge plan and follow up therapies (return to OP SLP services) Functional status assessment: -- (pt appears to be approaching baseline) Recommendations Recommendations for follow up therapy are one component of a multi-disciplinary discharge planning process, led by the attending physician.  Recommendations may be updated based on patient status, additional functional criteria and insurance authorization. Assessment: Orofacial Exam: Orofacial Exam Oral Cavity: Oral Hygiene: WFL Oral Cavity - Dentition: Adequate natural dentition Orofacial Anatomy: WFL Oral Motor/Sensory Function: WFL (pt indicating residual left orofacial weakness) Anatomy: Anatomy: WFL Boluses Administered: Boluses Administered Boluses Administered: Thin liquids (Level 0); Mildly thick liquids (Level 2, nectar thick); Puree; Solid  Oral Impairment Domain: Oral Impairment Domain Lip Closure: No labial escape Tongue control during bolus hold: Escape to lateral buccal cavity/floor of mouth Bolus preparation/mastication: Timely and efficient chewing and mashing Bolus transport/lingual motion: Brisk tongue motion Oral residue: Trace residue lining oral structures Location of oral residue : Tongue Initiation of pharyngeal swallow : Posterior angle of the ramus  Pharyngeal Impairment Domain: Pharyngeal Impairment Domain Soft palate elevation: No bolus between soft palate (SP)/pharyngeal wall (PW) Laryngeal elevation: Complete superior movement of thyroid  cartilage with complete approximation of arytenoids to epiglottic petiole Anterior hyoid excursion: Complete anterior movement Epiglottic movement: Complete inversion Laryngeal vestibule closure: Complete, no air/contrast in laryngeal vestibule Pharyngeal stripping wave : Present - complete Pharyngeal  contraction (A/P view only): N/A Pharyngoesophageal segment opening: Partial distention/partial duration, partial obstruction of flow Tongue base retraction: No contrast between tongue base and posterior pharyngeal wall (PPW) Pharyngeal residue: Trace residue within or on pharyngeal structures Location of pharyngeal residue: Pyriform sinuses (trace)  Esophageal Impairment Domain: Esophageal Impairment Domain Esophageal clearance upright position: Esophageal retention Pill: Pill Consistency administered: -- (n/a) Penetration/Aspiration Scale Score: Penetration/Aspiration Scale Score 1.  Material does not enter airway: Thin liquids (Level 0); Mildly thick liquids (Level 2, nectar thick); Puree; Solid Compensatory Strategies: Compensatory Strategies Compensatory strategies: No   General Information: Caregiver present: No  Diet Prior to this Study: Thin liquids (Level 0); Dysphagia 2 (finely chopped)   Temperature : Normal   Respiratory Status: WFL   Supplemental O2: None (Room air)   History of Recent Intubation: No  Behavior/Cognition: Alert; Cooperative Self-Feeding Abilities: Able to self-feed Baseline vocal quality/speech: Normal Volitional Cough: Able to elicit Volitional Swallow: Able to elicit Exam Limitations: No limitations Goal Planning: Prognosis for improved oropharyngeal function: -- (defer until completion of MBSS) No data recorded No data recorded Patient/Family Stated Goal: to eat as able Consulted and agree with results and recommendations: Patient; Family member/caregiver Pain: Pain Assessment Pain Assessment: No/denies pain End of Session: Start Time:SLP Start Time (ACUTE ONLY): 1150 Stop Time: SLP Stop Time (ACUTE ONLY): 1235 Time Calculation:SLP Time Calculation (min) (ACUTE ONLY): 45 min Charges: SLP Evaluations $ SLP Speech Visit: 1 Visit SLP Evaluations $BSS Swallow:  1 Procedure $MBS Swallow: 1 Procedure SLP visit diagnosis: SLP Visit Diagnosis: Dysphagia, pharyngoesophageal phase (R13.14) Past  Medical History: Past Medical History: Diagnosis Date  Allergy   Complication of anesthesia   pt reports he is starting to have more difficulty arousing after surgery  Diabetes mellitus (HCC)   GERD (gastroesophageal reflux disease)   Headache   History of kidney stones   Hyperlipidemia   Renal disorder   kidney stones Past Surgical History: Past Surgical History: Procedure Laterality Date  APPLICATION OF CRANIAL NAVIGATION Right 01/17/2021  Procedure: APPLICATION OF CRANIAL NAVIGATION;  Surgeon: Debby Dorn MATSU, MD;  Location: [facility] OR;  Service: Neurosurgery;  Laterality: Right;  COLONOSCOPY  09/03/1994  COLONOSCOPY WITH PROPOFOL  N/A 01/07/2023  Procedure: COLONOSCOPY WITH PROPOFOL ;  Surgeon: Jinny Carmine, MD;  Location: [facility] ENDOSCOPY;  Service: Endoscopy;  Laterality: N/A;  NOT TOO EARLY  CYSTOSCOPY WITH STENT PLACEMENT Right 01/25/2016  Procedure: CYSTOSCOPY WITH STENT PLACEMENT;  Surgeon: Redell Lynwood Napoleon, MD;  Location: [facility] ORS;  Service: Urology;  Laterality: Right;  FRAMELESS  BIOPSY WITH BRAINLAB Right 01/17/2021  Procedure: RIGHT STEREOTACTIC BIOPSY OF INSULAR LESION;  Surgeon: Debby Dorn MATSU, MD;  Location: [facility] OR;  Service: Neurosurgery;  Laterality: Right;  KNEE ARTHROSCOPY W/ MENISCAL REPAIR Right 06/23/1988  POLYPECTOMY  01/07/2023  Procedure: POLYPECTOMY;  Surgeon: Jinny Carmine, MD;  Location: [facility] ENDOSCOPY;  Service: Endoscopy;;  removal of birthmark  06/08/2013  SKIN CANCER EXCISION  06/23/2012  TYMPANOSTOMY TUBE PLACEMENT  08/06/1978  URETEROSCOPY WITH HOLMIUM LASER LITHOTRIPSY Right 01/25/2016  Procedure: URETEROSCOPY WITH HOLMIUM LASER LITHOTRIPSY;  Surgeon: Redell Lynwood Napoleon, MD;  Location: [facility] ORS;  Service: Urology;  Laterality: Right;  URETHRAL STRICTURE DILATATION    visual inspection of vocal cord    1973  WISDOM TOOTH EXTRACTION   [place] Jarrett Clapp, MS, CCC-SLP Speech Language Pathologist Rehab Services; [facility] -  [phone] (ascom) [place] J Clapp 12/10/2023, 12:54  PM  CT ANGIO HEAD NECK W WO CM Result Date: 12/09/2023 CLINICAL DATA:  Stroke/TIA, determine embolic source. Altered mental status, behavioral change since yesterday, increased slurred speech and gait changes. EXAM: CT ANGIOGRAPHY HEAD AND NECK WITH AND WITHOUT CONTRAST TECHNIQUE: Multidetector CT imaging of the head and neck was performed using the standard protocol during bolus administration of intravenous contrast. Multiplanar CT image reconstructions and MIPs were obtained to evaluate the vascular anatomy. Carotid stenosis measurements (when applicable) are obtained utilizing NASCET criteria, using the distal internal carotid diameter as the denominator. RADIATION DOSE REDUCTION: This exam was performed according to the departmental dose-optimization program which includes automated exposure control, adjustment of the mA and/or kV according to patient size and/or use of iterative reconstruction technique. CONTRAST:  75mL OMNIPAQUE  IOHEXOL  350 MG/ML SOLN COMPARISON:  MRI head earlier same day. FINDINGS: CT HEAD FINDINGS Brain: No acute intracranial hemorrhage. Heterogeneous areas of mineralization within the right frontal lobe with associated edema corresponding to findings of tumor on same day MRI. Similar focus of mineralization and edema within the right temporal periventricular white matter. Nonspecific hypoattenuation in the periventricular and subcortical white matter favored to reflect chronic microvascular ischemic changes. Generalized parenchymal volume loss. The basilar cisterns are patent. Posterior fossa is unremarkable. Ventricles: Ventricles are normal in size and configuration. Vascular: No hyperdense vessel. Intracranial atherosclerotic calcifications noted. Skull: No acute or aggressive finding. Sinuses/orbits: Orbits are symmetric. Mucosal thickening in the ethmoid sinuses, left greater than right. Other: Mastoid air cells are clear. CTA NECK FINDINGS Aortic arch: Standard configuration of  the aortic arch. Imaged  portion shows no evidence of aneurysm or dissection. No significant stenosis of the major arch vessel origins. Pulmonary arteries: As permitted by contrast timing, there are no filling defects in the visualized pulmonary arteries. Subclavian arteries: The subclavian arteries are patent bilaterally. Right carotid system: Patent. Mild atherosclerosis at the carotid bifurcation without hemodynamically significant stenosis. No evidence of dissection. Left carotid system: Patent. Mild atherosclerosis at the carotid bifurcation without hemodynamically significant stenosis. No evidence of dissection. Vertebral arteries: Codominant. No evidence of dissection, stenosis (50% or greater), or occlusion. Atherosclerosis at the left vertebral artery origin resulting in mild stenosis. Skeleton: No acute or aggressive finding noted. Other neck: The visualized airway is patent. No cervical lymphadenopathy. Upper chest: Visualized lung apices are clear. Review of the MIP images confirms the above findings CTA HEAD FINDINGS ANTERIOR CIRCULATION: The intracranial ICAs are patent bilaterally. Mild atherosclerosis of the carotid siphons. No significant stenosis, proximal occlusion, aneurysm, or vascular malformation. MCAs: Patent bilaterally. Mild to moderate multifocal narrowing of an M2 inferior division branch of the right MCA. ACAs: Patent bilaterally. 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 There is severe narrowing of the A3 and proximal A4 segments of the right ACA. POSTERIOR CIRCULATION: No significant stenosis, proximal occlusion, aneurysm, or vascular malformation. PCAs: The PCAs are patent proximally. Fetal origin of both PCAs. There is severe stenosis and occlusion of the P3 segment of the right PCA. Few small caliber distal PCA branches noted likely perfused by collaterals. Pcomm: The posterior communicating arteries are visualized bilaterally. SCAs: The superior  cerebellar arteries are patent bilaterally. Basilar artery: Patent.  Distal tapering of the basilar artery. AICAs: Not well visualized. PICAs: Patent Vertebral arteries: The intracranial vertebral arteries are patent. Venous sinuses: As permitted by contrast timing, patent. Anatomic variants: None Review of the MIP images confirms the above findings IMPRESSION: No CT evidence of acute intracranial abnormality. Heterogeneous areas of mineralization and surrounding edema in the right frontal lobe and right temporal periventricular white matter corresponding to regions of tumor noted on same day MRI. 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 Intracranial arterial vasculature is otherwise patent. 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 Mild-to-moderate multifocal stenosis of an M2 inferior division branch of the right MCA. No high-grade stenosis of the arteries in the neck. Electronically Signed   By: Donnice Mania M.D.   On: 12/09/2023 13:48   MR Brain W and Wo Contrast Result Date: 12/09/2023 CLINICAL DATA:  Delirium had MRI about 1 week ago, since then has had increased difficulty speaking, R leg weakness, increased somnolence. eval for stroke, edema, tumor progression etc EXAM: MRI HEAD WITHOUT AND WITH CONTRAST TECHNIQUE: Multiplanar, multiecho pulse sequences of the brain and surrounding structures were obtained without and with intravenous contrast. CONTRAST:  6mL GADAVIST  GADOBUTROL  1 MMOL/ML IV SOLN COMPARISON:  MRI head December 03, 2023. FINDINGS: Brain: New punctate focus of restricted diffusion in the anterior right temporal lobe (series 5, image 28). Resolution of the previously seen small focus of restricted diffusion in the right corona radiata. Additional areas of masslike restricted diffusion and signal abnormality involving the right frontal lobe, genu of the corpus callosum extending into the left frontal lobe, and right periatrial region, compatible with known  tumor. More facet areas of T1 hyperintensity in the right frontal lobe right periatrial region are unchanged. No substantial mass effect or midline shift. No hydrocephalus. Similar cerebral atrophy. Vascular: Major arterial flow voids are maintained. Skull  and upper cervical spine: Normal marrow signal. Sinuses/Orbits: Negative. IMPRESSION: 1. New punctate focus of restricted diffusion in the anterior right temporal lobe could represent a small acute infarct (favored) or new/progressive small site of tumor. 2. Otherwise, unchanged appearance of multifocal signal abnormality restricted diffusion in the right frontal and right temporal lobes and corpus callosum, compatible with known tumor. Electronically Signed   By: Gilmore GORMAN Molt M.D.   On: 12/09/2023 01:17   CT Head Wo Contrast Result Date: 12/08/2023 CLINICAL DATA:  [place] altered EXAM: CT HEAD WITHOUT CONTRAST TECHNIQUE: Contiguous axial images were obtained from the base of the skull through the vertex without intravenous contrast. RADIATION DOSE REDUCTION: This exam was performed according to the departmental dose-optimization program which includes automated exposure control, adjustment of the mA and/or kV according to patient size and/or use of iterative reconstruction technique. COMPARISON:  MRI 12/03/2023, CT brain 09/09/2023 FINDINGS: Brain: Amorphous hyperdensity in the right frontal and temporal lobes, progressive compared with head CT from March and favored to represent mineralization over hemorrhage, and corresponding to history of known brain lesions. White matter hypodensity again visible in the right frontal and temporal lobes as well as right insula. Areas of cystic encephalomalacia as before. Grossly stable size and configuration of the ventricles with ex vacuo dilatation on the right. Vascular: No hyperdense vessels.  Carotid vascular calcification Skull: No fracture. Sinuses/Orbits: No acute finding. Mild mucosal thickening in the  sinuses Other: None IMPRESSION: Amorphous hyperdensity in the right frontal and temporal lobes, progressive compared with head CT from March and favored to represent mineralization over hemorrhage, and corresponding to history of known brain lesions. Other grossly stable areas of white matter hypodensity as demonstrated on recent MRI imaging. Stable ventricle size and morphology compared to prior. Electronically Signed   By: Luke Bun M.D.   On: 12/08/2023 21:58   DG Chest Portable 1 View Result Date: 12/08/2023 CLINICAL DATA:  Altered mental status. EXAM: PORTABLE CHEST 1 VIEW COMPARISON:  September 09, 2023 FINDINGS: The heart size and mediastinal contours are within normal limits. Both lungs are clear. The visualized skeletal structures are unremarkable. IMPRESSION: No active disease. Electronically Signed   By: Suzen Dials M.D.   On: 12/08/2023 21:30   MR BRAIN W WO CONTRAST Result Date: 12/03/2023 CLINICAL DATA:  Oligodendroglioma status post treatment, assess treatment response EXAM: MRI HEAD WITHOUT AND WITH CONTRAST TECHNIQUE: Multiplanar, multiecho pulse sequences of the brain and surrounding structures were obtained without and with intravenous contrast. CONTRAST:  7mL GADAVIST  GADOBUTROL  1 MMOL/ML IV SOLN COMPARISON:  MRI of the brain dated October 02, 2023. FINDINGS: Brain: There are new foci of increased diffusion signal present within the right corona radiata, insular ribbon and temporal cortex present on image 29 of series 5. There has been interval normalization of increase diffusion signal and contrast enhancement previously noted within the right cerebral peduncle. There continues to be increased diffusion signal within the right frontal lobe and genu of the corpus callosum. There is persistent amorphous area of increased T1 signal within the right frontal lobe, which appears similar to the prior study. There is moderate generalized cerebral volume loss, which is more pronounced on the  right. There is mild-to-moderate periventricular white matter disease, also more pronounced on the right. Vascular: Normal flow voids. Skull and upper cervical spine: Status post right frontal burr craniotomy. Normal bone marrow signal. Sinuses/Orbits: Mild mucosal disease within the ethmoid and maxillary sinuses. The orbits are unremarkable. Other: None. IMPRESSION: 1. New small foci of  potential restricted diffusion within the right corona radiata, insular ribbon and temporal cortex. 2. Interval near resolution of restricted diffusion and contrast enhancement within the right cerebral peduncle. 3. Stable appearance of the patient's primary tumor site in the right frontal lobe. Electronically Signed   By: [name] M.D.   On: 12/03/2023 13:23      Labs: BNP (last 3 results) No results for input(s): BNP in the last 8760 hours. Basic Metabolic Panel: Recent Labs  Lab 12/08/23 2059 12/09/23 0427 12/10/23 0434  NA 133* 134* 139  K 4.0 3.8 4.2  CL 93* 101 105  CO2 27 26 26   GLUCOSE 139* 117* 108*  BUN 19 17 18   CREATININE 0.91 0.77 0.80  CALCIUM  9.4 8.0* 8.8*  MG  --   --  2.1   Liver Function Tests: Recent Labs  Lab 12/08/23 2059  AST 22  ALT 19  ALKPHOS 77  BILITOT 1.6*  PROT 8.1  ALBUMIN 4.5   No results for input(s): LIPASE, AMYLASE in the last 168 hours. No results for input(s): AMMONIA in the last 168 hours. CBC: Recent Labs  Lab 12/08/23 2059 12/09/23 0427 12/10/23 0434  WBC 10.8* 11.9* 10.2  HGB 16.5 12.9* 12.9*  HCT 47.9 36.7* 38.2*  MCV 85.2 85.0 85.1  PLT 176 131* 146*   Cardiac Enzymes: No results for input(s): CKTOTAL, CKMB, CKMBINDEX, TROPONINI in the last 168 hours. BNP: Invalid input(s): POCBNP CBG: Recent Labs  Lab 12/08/23 2043  GLUCAP 136*   D-Dimer No results for input(s): DDIMER in the last 72 hours. Hgb A1c No results for input(s): HGBA1C in the last 72 hours. Lipid Profile Recent Labs    12/09/23 0427   CHOL 115  HDL 24*  LDLCALC 81  TRIG 48  CHOLHDL 4.8   Thyroid  function studies No results for input(s): TSH, T4TOTAL, T3FREE, THYROIDAB in the last 72 hours.  Invalid input(s): FREET3 Anemia work up No results for input(s): VITAMINB12, FOLATE, FERRITIN, TIBC, IRON, RETICCTPCT in the last 72 hours. Urinalysis    Component Value Date/Time   COLORURINE AMBER (A) 12/09/2023 0025   APPEARANCEUR HAZY (A) 12/09/2023 0025   APPEARANCEUR Cloudy (A) 02/20/2016 1441   LABSPEC 1.029 12/09/2023 0025   LABSPEC 1.026 05/04/2012 2052   PHURINE 5.0 12/09/2023 0025   GLUCOSEU NEGATIVE 12/09/2023 0025   GLUCOSEU Negative 05/04/2012 2052   HGBUR NEGATIVE 12/09/2023 0025   BILIRUBINUR NEGATIVE 12/09/2023 0025   BILIRUBINUR Negative 02/20/2016 1441   BILIRUBINUR Negative 05/04/2012 2052   KETONESUR 20 (A) 12/09/2023 0025   PROTEINUR >=300 (A) 12/09/2023 0025   NITRITE NEGATIVE 12/09/2023 0025   LEUKOCYTESUR NEGATIVE 12/09/2023 0025   LEUKOCYTESUR Negative 05/04/2012 2052   Sepsis Labs Recent Labs  Lab 12/08/23 2059 12/09/23 0427 12/10/23 0434  WBC 10.8* 11.9* 10.2   Microbiology Recent Results (from the past 240 hours)  Resp panel by RT-PCR (RSV, Flu A&B, Covid) Anterior Nasal Swab     Status: None   Collection Time: 12/08/23  9:38 PM   Specimen: Anterior Nasal Swab  Result Value Ref Range Status   SARS Coronavirus 2 by RT PCR NEGATIVE NEGATIVE Final    Comment: (NOTE) SARS-CoV-2 target nucleic acids are NOT DETECTED.  The SARS-CoV-2 RNA is generally detectable in upper respiratory specimens during the acute phase of infection. The lowest concentration of SARS-CoV-2 viral copies this assay can detect is 138 copies/mL. A negative result does not preclude SARS-Cov-2 infection and should not be used as the sole basis for  treatment or other patient management decisions. A negative result may occur with  improper specimen collection/handling, submission of  specimen other than nasopharyngeal swab, presence of viral mutation(s) within the areas targeted by this assay, and inadequate number of viral copies(<138 copies/mL). A negative result must be combined with clinical observations, patient history, and epidemiological information. The expected result is Negative.  Fact Sheet for Patients:  BloggerCourse.com  Fact Sheet for Healthcare Providers:  SeriousBroker.it  This test is no t yet approved or cleared by the [place]  FDA and  has been authorized for detection and/or diagnosis of SARS-CoV-2 by FDA under an Emergency Use Authorization (EUA). This EUA will remain  in effect (meaning this test can be used) for the duration of the COVID-19 declaration under Section 564(b)(1) of the Act, 21 U.S.C.section 360bbb-3(b)(1), unless the authorization is terminated  or revoked sooner.       Influenza A by PCR NEGATIVE NEGATIVE Final   Influenza B by PCR NEGATIVE NEGATIVE Final    Comment: (NOTE) The Xpert Xpress SARS-CoV-2/FLU/RSV plus assay is intended as an aid in the diagnosis of influenza from Nasopharyngeal swab specimens and should not be used as a sole basis for treatment. Nasal washings and aspirates are unacceptable for Xpert Xpress SARS-CoV-2/FLU/RSV testing.  Fact Sheet for Patients: BloggerCourse.com  Fact Sheet for Healthcare Providers: SeriousBroker.it  This test is not yet approved or cleared by the [place]  FDA and has been authorized for detection and/or diagnosis of SARS-CoV-2 by FDA under an Emergency Use Authorization (EUA). This EUA will remain in effect (meaning this test can be used) for the duration of the COVID-19 declaration under Section 564(b)(1) of the Act, 21 U.S.C. section 360bbb-3(b)(1), unless the authorization is terminated or revoked.     Resp Syncytial Virus by PCR NEGATIVE NEGATIVE Final     Comment: (NOTE) Fact Sheet for Patients: BloggerCourse.com  Fact Sheet for Healthcare Providers: SeriousBroker.it  This test is not yet approved or cleared by the [place]  FDA and has been authorized for detection and/or diagnosis of SARS-CoV-2 by FDA under an Emergency Use Authorization (EUA). This EUA will remain in effect (meaning this test can be used) for the duration of the COVID-19 declaration under Section 564(b)(1) of the Act, 21 U.S.C. section 360bbb-3(b)(1), unless the authorization is terminated or revoked.  Performed at [facility], [address], [place], [place] [address]   Urine Culture     Status: None   Collection Time: 12/09/23 12:25 AM   Specimen: Urine, Random  Result Value Ref Range Status   Specimen Description   Final    URINE, RANDOM Performed at [facility], [address], [place], [place] [address]    Special Requests   Final    NONE Performed at [facility], [address], [place], [place] [address]    Culture   Final    NO GROWTH Performed at [facility], [address], [place], [place] [address]    Report Status 12/10/2023 FINAL  Final  Blood culture (routine x 2)     Status: None (Preliminary result)   Collection Time: 12/09/23  2:17 AM   Specimen: BLOOD  Result Value Ref Range Status   Specimen Description BLOOD BLOOD LEFT HAND  Final   Special Requests   Final    BOTTLES DRAWN AEROBIC AND ANAEROBIC Blood Culture adequate volume   Culture   Final    NO GROWTH < 12 HOURS Performed at [facility], [address] [place], [place] [address]    Report Status PENDING  Incomplete  Blood culture (routine x 2)     Status: None (Preliminary result)   Collection Time: 12/09/23  2:17 AM   Specimen: BLOOD  Result Value Ref Range Status   Specimen Description BLOOD BLOOD RIGHT HAND  Final   Special Requests   Final    BOTTLES DRAWN  AEROBIC AND ANAEROBIC Blood Culture results may not be optimal due to an inadequate volume of blood received in culture bottles   Culture   Final    NO GROWTH < 12 HOURS Performed at [facility], [address], [place], [place] [address]    Report Status PENDING  Incomplete     Total time spend on discharging this patient, including the last patient exam, discussing the hospital stay, instructions for ongoing care as it relates to all pertinent caregivers, as well as preparing the medical discharge records, prescriptions, and/or referrals as applicable, is 45 minutes.    Ellouise Haber, MD  [facility] 12/10/2023, 3:09 PM

## 2023-12-10 NOTE — Plan of Care (Signed)
  Problem: Fluid Volume: Goal: Hemodynamic stability will improve Outcome: Progressing   Problem: Clinical Measurements: Goal: Diagnostic test results will improve Outcome: Progressing Goal: Signs and symptoms of infection will decrease Outcome: Progressing   Problem: Respiratory: Goal: Ability to maintain adequate ventilation will improve Outcome: Progressing   Problem: Education: Goal: Knowledge of disease or condition will improve Outcome: Progressing Goal: Knowledge of secondary prevention will improve (MUST DOCUMENT ALL) Outcome: Progressing Goal: Knowledge of patient specific risk factors will improve (DELETE if not current risk factor) Outcome: Progressing   Problem: Ischemic Stroke/TIA Tissue Perfusion: Goal: Complications of ischemic stroke/TIA will be minimized Outcome: Progressing   Problem: Coping: Goal: Will verbalize positive feelings about self Outcome: Progressing Goal: Will identify appropriate support needs Outcome: Progressing   Problem: Health Behavior/Discharge Planning: Goal: Ability to manage health-related needs will improve Outcome: Progressing Goal: Goals will be collaboratively established with patient/family Outcome: Progressing   Problem: Self-Care: Goal: Ability to participate in self-care as condition permits will improve Outcome: Progressing Goal: Verbalization of feelings and concerns over difficulty with self-care will improve Outcome: Progressing Goal: Ability to communicate needs accurately will improve Outcome: Progressing   Problem: Nutrition: Goal: Risk of aspiration will decrease Outcome: Progressing Goal: Dietary intake will improve Outcome: Progressing   Problem: Education: Goal: Knowledge of General Education information will improve Description: Including pain rating scale, medication(s)/side effects and non-pharmacologic comfort measures Outcome: Progressing   Problem: Health Behavior/Discharge Planning: Goal:  Ability to manage health-related needs will improve Outcome: Progressing   Problem: Clinical Measurements: Goal: Ability to maintain clinical measurements within normal limits will improve Outcome: Progressing Goal: Will remain free from infection Outcome: Progressing Goal: Diagnostic test results will improve Outcome: Progressing Goal: Respiratory complications will improve Outcome: Progressing Goal: Cardiovascular complication will be avoided Outcome: Progressing   Problem: Activity: Goal: Risk for activity intolerance will decrease Outcome: Progressing   Problem: Nutrition: Goal: Adequate nutrition will be maintained Outcome: Progressing   Problem: Coping: Goal: Level of anxiety will decrease Outcome: Progressing   Problem: Elimination: Goal: Will not experience complications related to bowel motility Outcome: Progressing Goal: Will not experience complications related to urinary retention Outcome: Progressing   Problem: Pain Managment: Goal: General experience of comfort will improve and/or be controlled Outcome: Progressing   Problem: Safety: Goal: Ability to remain free from injury will improve Outcome: Progressing   Problem: Skin Integrity: Goal: Risk for impaired skin integrity will decrease Outcome: Progressing

## 2023-12-11 ENCOUNTER — Ambulatory Visit

## 2023-12-11 ENCOUNTER — Encounter: Admitting: Speech Pathology

## 2023-12-11 ENCOUNTER — Inpatient Hospital Stay: Admitting: Internal Medicine

## 2023-12-11 ENCOUNTER — Other Ambulatory Visit: Payer: Self-pay

## 2023-12-11 ENCOUNTER — Telehealth: Payer: Self-pay

## 2023-12-11 DIAGNOSIS — G40909 Epilepsy, unspecified, not intractable, without status epilepticus: Secondary | ICD-10-CM

## 2023-12-11 DIAGNOSIS — Y842 Radiological procedure and radiotherapy as the cause of abnormal reaction of the patient, or of later complication, without mention of misadventure at the time of the procedure: Secondary | ICD-10-CM

## 2023-12-11 DIAGNOSIS — I6789 Other cerebrovascular disease: Secondary | ICD-10-CM | POA: Diagnosis not present

## 2023-12-11 DIAGNOSIS — R451 Restlessness and agitation: Secondary | ICD-10-CM | POA: Insufficient documentation

## 2023-12-11 DIAGNOSIS — C710 Malignant neoplasm of cerebrum, except lobes and ventricles: Secondary | ICD-10-CM

## 2023-12-11 DIAGNOSIS — R569 Unspecified convulsions: Secondary | ICD-10-CM

## 2023-12-11 LAB — CULTURE, BLOOD (ROUTINE X 2)

## 2023-12-11 MED ORDER — ARIPIPRAZOLE 5 MG PO TABS: 5.0000 mg | ORAL_TABLET | Freq: Every day | ORAL | 3 refills | Status: DC

## 2023-12-11 MED ORDER — APIXABAN 5 MG PO TABS: 5.0000 mg | ORAL_TABLET | Freq: Two times a day (BID) | ORAL | 3 refills | Status: DC

## 2023-12-11 NOTE — Transitions of Care (Post Inpatient/ED Visit) (Signed)
   12/11/2023  Name: Alan [place] MRN: [id number] DOB: 1971-03-28  Today's TOC FU Call Status: Today's TOC FU Call Status:: Unsuccessful Call (1st Attempt) Unsuccessful Call (1st Attempt) Date: 12/11/23  Attempted to reach the patient regarding the most recent Inpatient/ED visit. Currently noted to be at Oncology appointment after unsuccessful outreach #1.   Follow Up Plan: Additional outreach attempts will be made to reach the patient to complete the Transitions of Care (Post Inpatient/ED visit) call.    Katheryn Pandy MSN, RN RN Case Sales executive Health  [facility] Office Hours M-F [phone] Direct Dial: [phone] Main Phone [phone]  Fax: [phone] [email or web address]

## 2023-12-11 NOTE — Progress Notes (Signed)
 I connected with Alan Wise on 12/11/23 at 11:00 AM EDT by telephone visit and verified that I am speaking with the correct person using two identifiers.  I discussed the limitations, risks, security and privacy concerns of performing an evaluation and management service by telemedicine and the availability of in-person appointments. I also discussed with the patient that there may be a patient responsible charge related to this service. The patient expressed understanding and agreed to proceed.   Other persons participating in the visit and their role in the encounter:  spouse  Patient's location:  Home Provider's location:  Office Chief Complaint:  Radiation therapy induced brain necrosis  Seizure disorder (HCC)  Psychomotor agitation  History of Present Ilness: Alan Wise reports some improvement in lethargy, confusion since discharge from the hospital.  No home antibiotics following IV treatments inpatient.  Wife describes ongoing bouts of rage, severe agitation and irritability as prior.    Observations: Language and cognition at baseline  Imaging:  [facility] Clinician Interpretation: I have personally reviewed the CNS images as listed.  My interpretation, in the context of the patient's clinical presentation, is DWI bright lesions secondary to radiation necrosis or small vessel infarct  DG Swallowing Func-Speech Pathology Result Date: 12/10/2023 Table formatting from the original result was not included. Modified Barium Swallow Study Patient Details Name: Alan Wise MRN: [id number] Date of Birth: [birth year] Today's Date: 12/10/2023 HPI/PMH: HPI: Per MD progress note, Alan Wise is a 53 y.o. Caucasian male with medical history significant for oligodendroglioma, GERD, type 2 diabetes mellitus, dyslipidemia and urolithiasis, presented to the emergency room with acute onset of altered mental status and recently diminished appetite over the last 4 days.  He saw his PCP  earlier this week and had an MRI as he has been having slowly increasing weakness and difficulty with his speech which has been more difficult to understand.  He has not been ambulating as usual.  He has been more fatigued and tired and somnolent. MRI 12/09/23: New punctate focus of restricted diffusion in the anterior right  temporal lobe could represent a small acute infarct (favored) or  new/progressive small site of tumor.  2. Otherwise, unchanged appearance of multifocal signal abnormality  restricted diffusion in the right frontal and right temporal lobes  and corpus callosum, compatible with known tumor. CXR 6/17: No active disease. Clinical Impression: Clinical Impression: Pt seen for MBSS demonstrating mild pharyngoesophageal phase dysphagia. Pt presents with improvement in pharyngeal/esophageal phase of swallow in comparison with MBSS completed in March 2025. This date pt with continued residue at the UES and reduced distention of UES coupled with retention in the esophagus for more viscous substances- though no retrograde movement. Residue spilling into pyriform sinus from the UES space, though not continuing to the laryngeal vestibule. No overt airway compromise across consistencies. Cough noted x1 without evidence of penetration/aspiration. Oral phase grossly intact.     Despite lack of aspiration during study- given known choking and mild deficits related to UES distention/residue and reflux/retention- pt is at increased risk for aspiration, specifically with substances of increased viscosity. Trials of liquid wash and double swallow aided to reduce residue.     Continue to recommend dys 2 (chopped) solids to aid clearance and use of compensatory strategies- double swallow and liquid wash. Recommend aspiration precautions- slow rate, small bites, elevated HOB (for at least 30 min after swallow), and alert for PO intake. Pt and spouse aware of recommendations. RN and MD notified of results. Recommend  follow up with OP SLP services (pt is currently on OP caseload). Factors that may increase risk of adverse event in presence of aspiration Alan Wise 2021): Factors that may increase risk of adverse event in presence of aspiration Alan Wise 2021): Frail or deconditioned; Limited mobility (min deconditioned) Recommendations/Plan: Swallowing Evaluation Recommendations Swallowing Evaluation Recommendations Recommendations: PO diet PO Diet Recommendation: Dysphagia 2 (Finely chopped); Thin liquids (Level 0) Liquid Administration via: Cup; Straw Medication Administration: Whole meds with liquid Supervision: Patient able to self-feed; Intermittent supervision/cueing for swallowing strategies Swallowing strategies  : Slow rate; Small bites/sips; Follow solids with liquids Postural changes: Position pt fully upright for meals; Stay upright 30-60 min after meals Oral care recommendations: Oral care BID (2x/day) Treatment Plan Treatment Plan Treatment recommendations: Defer treatment plan to SLP at other venue (see follow-up recommendations) (follow up with OP SLP) Follow-up recommendations: Follow physicians's recommendations for discharge plan and follow up therapies (return to OP SLP services) Functional status assessment: -- (pt appears to be approaching baseline) Recommendations Recommendations for follow up therapy are one component of a multi-disciplinary discharge planning process, led by the attending physician.  Recommendations may be updated based on patient status, additional functional criteria and insurance authorization. Assessment: Orofacial Exam: Orofacial Exam Oral Cavity: Oral Hygiene: WFL Oral Cavity - Dentition: Adequate natural dentition Orofacial Anatomy: WFL Oral Motor/Sensory Function: WFL (pt indicating residual left orofacial weakness) Anatomy: Anatomy: WFL Boluses Administered: Boluses Administered Boluses Administered: Thin liquids (Level 0); Mildly thick liquids (Level 2, nectar  thick); Puree; Solid  Oral Impairment Domain: Oral Impairment Domain Lip Closure: No labial escape Tongue control during bolus hold: Escape to lateral buccal cavity/floor of mouth Bolus preparation/mastication: Timely and efficient chewing and mashing Bolus transport/lingual motion: Brisk tongue motion Oral residue: Trace residue lining oral structures Location of oral residue : Tongue Initiation of pharyngeal swallow : Posterior angle of the ramus  Pharyngeal Impairment Domain: Pharyngeal Impairment Domain Soft palate elevation: No bolus between soft palate (SP)/pharyngeal wall (PW) Laryngeal elevation: Complete superior movement of thyroid  cartilage with complete approximation of arytenoids to epiglottic petiole Anterior hyoid excursion: Complete anterior movement Epiglottic movement: Complete inversion Laryngeal vestibule closure: Complete, no air/contrast in laryngeal vestibule Pharyngeal stripping wave : Present - complete Pharyngeal contraction (A/P view only): N/A Pharyngoesophageal segment opening: Partial distention/partial duration, partial obstruction of flow Tongue base retraction: No contrast between tongue base and posterior pharyngeal wall (PPW) Pharyngeal residue: Trace residue within or on pharyngeal structures Location of pharyngeal residue: Pyriform sinuses (trace)  Esophageal Impairment Domain: Esophageal Impairment Domain Esophageal clearance upright position: Esophageal retention Pill: Pill Consistency administered: -- (n/a) Penetration/Aspiration Scale Score: Penetration/Aspiration Scale Score 1.  Material does not enter airway: Thin liquids (Level 0); Mildly thick liquids (Level 2, nectar thick); Puree; Solid Compensatory Strategies: Compensatory Strategies Compensatory strategies: No   General Information: Caregiver present: No  Diet Prior to this Study: Thin liquids (Level 0); Dysphagia 2 (finely chopped)   Temperature : Normal   Respiratory Status: WFL   Supplemental O2: None (Room air)    History of Recent Intubation: No  Behavior/Cognition: Alert; Cooperative Self-Feeding Abilities: Able to self-feed Baseline vocal quality/speech: Normal Volitional Cough: Able to elicit Volitional Swallow: Able to elicit Exam Limitations: No limitations Goal Planning: Prognosis for improved oropharyngeal function: -- (defer until completion of MBSS) No data recorded No data recorded Patient/Family Stated Goal: to eat as able Consulted and agree with results and recommendations: Patient; Family member/caregiver Pain: Pain Assessment Pain Assessment: No/denies pain End of Session:  Start Time:SLP Start Time (ACUTE ONLY): 1150 Stop Time: SLP Stop Time (ACUTE ONLY): 1235 Time Calculation:SLP Time Calculation (min) (ACUTE ONLY): 45 min Charges: SLP Evaluations $ SLP Speech Visit: 1 Visit SLP Evaluations $BSS Swallow: 1 Procedure $MBS Swallow: 1 Procedure SLP visit diagnosis: SLP Visit Diagnosis: Dysphagia, pharyngoesophageal phase (R13.14) Past Medical History: Past Medical History: Diagnosis Date  Allergy   Complication of anesthesia   pt reports he is starting to have more difficulty arousing after surgery  Diabetes mellitus (HCC)   GERD (gastroesophageal reflux disease)   Headache   History of kidney stones   Hyperlipidemia   Renal disorder   kidney stones Past Surgical History: Past Surgical History: Procedure Laterality Date  APPLICATION OF CRANIAL NAVIGATION Right 01/17/2021  Procedure: APPLICATION OF CRANIAL NAVIGATION;  Surgeon: Van Gelinas, MD;  Location: [facility] OR;  Service: Neurosurgery;  Laterality: Right;  COLONOSCOPY  09/03/1994  COLONOSCOPY WITH PROPOFOL  N/A 01/07/2023  Procedure: COLONOSCOPY WITH PROPOFOL ;  Surgeon: Marnee Sink, MD;  Location: [facility] ENDOSCOPY;  Service: Endoscopy;  Laterality: N/A;  NOT TOO EARLY  CYSTOSCOPY WITH STENT PLACEMENT Right 01/25/2016  Procedure: CYSTOSCOPY WITH STENT PLACEMENT;  Surgeon: Bart Born, MD;  Location: [facility] ORS;  Service: Urology;  Laterality: Right;   FRAMELESS  BIOPSY WITH BRAINLAB Right 01/17/2021  Procedure: RIGHT STEREOTACTIC BIOPSY OF INSULAR LESION;  Surgeon: Van Gelinas, MD;  Location: [facility] OR;  Service: Neurosurgery;  Laterality: Right;  KNEE ARTHROSCOPY W/ MENISCAL REPAIR Right 06/23/1988  POLYPECTOMY  01/07/2023  Procedure: POLYPECTOMY;  Surgeon: Marnee Sink, MD;  Location: [facility] ENDOSCOPY;  Service: Endoscopy;;  removal of birthmark  06/08/2013  SKIN CANCER EXCISION  06/23/2012  TYMPANOSTOMY TUBE PLACEMENT  08/06/1978  URETEROSCOPY WITH HOLMIUM LASER LITHOTRIPSY Right 01/25/2016  Procedure: URETEROSCOPY WITH HOLMIUM LASER LITHOTRIPSY;  Surgeon: Bart Born, MD;  Location: [facility] ORS;  Service: Urology;  Laterality: Right;  URETHRAL STRICTURE DILATATION    visual inspection of vocal cord    1973  WISDOM TOOTH EXTRACTION   [place] Jarrett Clapp, MS, CCC-SLP Speech Language Pathologist Rehab Services; [facility] [phone] (ascom) [place] J Clapp 12/10/2023, 12:54 PM  CT ANGIO HEAD NECK W WO CM Result Date: 12/09/2023 CLINICAL DATA:  Stroke/TIA, determine embolic source. Altered mental status, behavioral change since yesterday, increased slurred speech and gait changes. EXAM: CT ANGIOGRAPHY HEAD AND NECK WITH AND WITHOUT CONTRAST TECHNIQUE: Multidetector CT imaging of the head and neck was performed using the standard protocol during bolus administration of intravenous contrast. Multiplanar CT image reconstructions and MIPs were obtained to evaluate the vascular anatomy. Carotid stenosis measurements (when applicable) are obtained utilizing NASCET criteria, using the distal internal carotid diameter as the denominator. RADIATION DOSE REDUCTION: This exam was performed according to the departmental dose-optimization program which includes automated exposure control, adjustment of the mA and/or kV according to patient size and/or use of iterative reconstruction technique. CONTRAST:  75mL OMNIPAQUE  IOHEXOL  350 MG/ML SOLN COMPARISON:  MRI  head earlier same day. FINDINGS: CT HEAD FINDINGS Brain: No acute intracranial hemorrhage. Heterogeneous areas of mineralization within the right frontal lobe with associated edema corresponding to findings of tumor on same day MRI. Similar focus of mineralization and edema within the right temporal periventricular white matter. Nonspecific hypoattenuation in the periventricular and subcortical white matter favored to reflect chronic microvascular ischemic changes. Generalized parenchymal volume loss. The basilar cisterns are patent. Posterior fossa is unremarkable. Ventricles: Ventricles are normal in size and configuration. Vascular: No hyperdense vessel. Intracranial atherosclerotic calcifications noted. Skull: No  acute or aggressive finding. Sinuses/orbits: Orbits are symmetric. Mucosal thickening in the ethmoid sinuses, left greater than right. Other: Mastoid air cells are clear. CTA NECK FINDINGS Aortic arch: Standard configuration of the aortic arch. Imaged portion shows no evidence of aneurysm or dissection. No significant stenosis of the major arch vessel origins. Pulmonary arteries: As permitted by contrast timing, there are no filling defects in the visualized pulmonary arteries. Subclavian arteries: The subclavian arteries are patent bilaterally. Right carotid system: Patent. Mild atherosclerosis at the carotid bifurcation without hemodynamically significant stenosis. No evidence of dissection. Left carotid system: Patent. Mild atherosclerosis at the carotid bifurcation without hemodynamically significant stenosis. No evidence of dissection. Vertebral arteries: Codominant. No evidence of dissection, stenosis (50% or greater), or occlusion. Atherosclerosis at the left vertebral artery origin resulting in mild stenosis. Skeleton: No acute or aggressive finding noted. Other neck: The visualized airway is patent. No cervical lymphadenopathy. Upper chest: Visualized lung apices are clear. Review of the MIP  images confirms the above findings CTA HEAD FINDINGS ANTERIOR CIRCULATION: The intracranial ICAs are patent bilaterally. Mild atherosclerosis of the carotid siphons. No significant stenosis, proximal occlusion, aneurysm, or vascular malformation. MCAs: Patent bilaterally. Mild to moderate multifocal narrowing of an M2 inferior division branch of the right MCA. ACAs: Patent bilaterally. 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 There is severe narrowing of the A3 and proximal A4 segments of the right ACA. POSTERIOR CIRCULATION: No significant stenosis, proximal occlusion, aneurysm, or vascular malformation. PCAs: The PCAs are patent proximally. Fetal origin of both PCAs. There is severe stenosis and occlusion of the P3 segment of the right PCA. Few small caliber distal PCA branches noted likely perfused by collaterals. Pcomm: The posterior communicating arteries are visualized bilaterally. SCAs: The superior cerebellar arteries are patent bilaterally. Basilar artery: Patent.  Distal tapering of the basilar artery. AICAs: Not well visualized. PICAs: Patent Vertebral arteries: The intracranial vertebral arteries are patent. Venous sinuses: As permitted by contrast timing, patent. Anatomic variants: None Review of the MIP images confirms the above findings IMPRESSION: No CT evidence of acute intracranial abnormality. Heterogeneous areas of mineralization and surrounding edema in the right frontal lobe and right temporal periventricular white matter corresponding to regions of tumor noted on same day MRI. 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 Intracranial arterial vasculature is otherwise patent. 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 Mild-to-moderate multifocal stenosis of an M2 inferior division branch of the right MCA. No high-grade stenosis of the arteries in the neck. Electronically Signed   By: Denny Flack M.D.   On: 12/09/2023 13:48    MR Brain W and Wo Contrast Result Date: 12/09/2023 CLINICAL DATA:  Delirium had MRI about 1 week ago, since then has had increased difficulty speaking, R leg weakness, increased somnolence. eval for stroke, edema, tumor progression etc EXAM: MRI HEAD WITHOUT AND WITH CONTRAST TECHNIQUE: Multiplanar, multiecho pulse sequences of the brain and surrounding structures were obtained without and with intravenous contrast. CONTRAST:  6mL GADAVIST  GADOBUTROL  1 MMOL/ML IV SOLN COMPARISON:  MRI head December 03, 2023. FINDINGS: Brain: New punctate focus of restricted diffusion in the anterior right temporal lobe (series 5, image 28). Resolution of the previously seen small focus of restricted diffusion in the right corona radiata. Additional areas of masslike restricted diffusion and signal abnormality involving the right frontal lobe, genu of the corpus callosum extending into the left frontal lobe, and right periatrial region, compatible with known tumor.  More facet areas of T1 hyperintensity in the right frontal lobe right periatrial region are unchanged. No substantial mass effect or midline shift. No hydrocephalus. Similar cerebral atrophy. Vascular: Major arterial flow voids are maintained. Skull and upper cervical spine: Normal marrow signal. Sinuses/Orbits: Negative. IMPRESSION: 1. New punctate focus of restricted diffusion in the anterior right temporal lobe could represent a small acute infarct (favored) or new/progressive small site of tumor. 2. Otherwise, unchanged appearance of multifocal signal abnormality restricted diffusion in the right frontal and right temporal lobes and corpus callosum, compatible with known tumor. Electronically Signed   By: Stevenson Elbe M.D.   On: 12/09/2023 01:17   CT Head Wo Contrast Result Date: 12/08/2023 CLINICAL DATA:  Delirium altered EXAM: CT HEAD WITHOUT CONTRAST TECHNIQUE: Contiguous axial images were obtained from the base of the skull through the vertex without  intravenous contrast. RADIATION DOSE REDUCTION: This exam was performed according to the departmental dose-optimization program which includes automated exposure control, adjustment of the mA and/or kV according to patient size and/or use of iterative reconstruction technique. COMPARISON:  MRI 12/03/2023, CT brain 09/09/2023 FINDINGS: Brain: Amorphous hyperdensity in the right frontal and temporal lobes, progressive compared with head CT from March and favored to represent mineralization over hemorrhage, and corresponding to history of known brain lesions. White matter hypodensity again visible in the right frontal and temporal lobes as well as right insula. Areas of cystic encephalomalacia as before. Grossly stable size and configuration of the ventricles with ex vacuo dilatation on the right. Vascular: No hyperdense vessels.  Carotid vascular calcification Skull: No fracture. Sinuses/Orbits: No acute finding. Mild mucosal thickening in the sinuses Other: None IMPRESSION: Amorphous hyperdensity in the right frontal and temporal lobes, progressive compared with head CT from March and favored to represent mineralization over hemorrhage, and corresponding to history of known brain lesions. Other grossly stable areas of white matter hypodensity as demonstrated on recent MRI imaging. Stable ventricle size and morphology compared to prior. Electronically Signed   By: Esmeralda Hedge M.D.   On: 12/08/2023 21:58   DG Chest Portable 1 View Result Date: 12/08/2023 CLINICAL DATA:  Altered mental status. EXAM: PORTABLE CHEST 1 VIEW COMPARISON:  September 09, 2023 FINDINGS: The heart size and mediastinal contours are within normal limits. Both lungs are clear. The visualized skeletal structures are unremarkable. IMPRESSION: No active disease. Electronically Signed   By: Virgle Grime M.D.   On: 12/08/2023 21:30   MR BRAIN W WO CONTRAST Result Date: 12/03/2023 CLINICAL DATA:  Oligodendroglioma status post treatment, assess  treatment response EXAM: MRI HEAD WITHOUT AND WITH CONTRAST TECHNIQUE: Multiplanar, multiecho pulse sequences of the brain and surrounding structures were obtained without and with intravenous contrast. CONTRAST:  7mL GADAVIST  GADOBUTROL  1 MMOL/ML IV SOLN COMPARISON:  MRI of the brain dated October 02, 2023. FINDINGS: Brain: There are new foci of increased diffusion signal present within the right corona radiata, insular ribbon and temporal cortex present on image 29 of series 5. There has been interval normalization of increase diffusion signal and contrast enhancement previously noted within the right cerebral peduncle. There continues to be increased diffusion signal within the right frontal lobe and genu of the corpus callosum. There is persistent amorphous area of increased T1 signal within the right frontal lobe, which appears similar to the prior study. There is moderate generalized cerebral volume loss, which is more pronounced on the right. There is mild-to-moderate periventricular white matter disease, also more pronounced on the right. Vascular: Normal flow voids. Skull  and upper cervical spine: Status post right frontal burr craniotomy. Normal bone marrow signal. Sinuses/Orbits: Mild mucosal disease within the ethmoid and maxillary sinuses. The orbits are unremarkable. Other: None. IMPRESSION: 1. New small foci of potential restricted diffusion within the right corona radiata, insular ribbon and temporal cortex. 2. Interval near resolution of restricted diffusion and contrast enhancement within the right cerebral peduncle. 3. Stable appearance of the patient's primary tumor site in the right frontal lobe. Electronically Signed   By: Maribeth Shivers M.D.   On: 12/03/2023 13:23   Assessment and Plan: Radiation therapy induced brain necrosis  Seizure disorder [facility])  Psychomotor agitation  MILAM ALLBAUGH is clinically stable, now discharged from admission for complicated urethritis.  No further  antibiotics.  MRI brain demonstrated additional foci of DWI bright within the right hemisphere.  Etiology remains uncertain, evolving radiation necrosis vs novel small vessel infarcts.  Vascular imaging, echo, other stroke workup has been unremarkable.  Lesions keep appearing despite good compliance with anti-platelet drug.    Recommended trial of Eliquis  5mg  BID given failure of aspirin  and repeated hospitalizations.  This could potentially address both etiologies, especially if he is hypercoagulable locally from radiation injury.  Overall we feel the risks of anticoagulation are outweighed by potential benefits at this time.     We discussed his ongoing behavioral issues which are leading to signficant dysfunction at home.  He is agreeable with trial of Abilify 5mg  daily in addition to his standing Lamictal  for seizure prevention.    Follow Up Instructions: Recommended following up in 2 months with clinical evaluation, titration of oral medications described above.  Next MRI has been ordered for October 2025, will keep in place for now.  I discussed the assessment and treatment plan with the patient.  The patient was provided an opportunity to ask questions and all were answered.  The patient agreed with the plan and demonstrated understanding of the instructions.    The patient was advised to call back or seek an in-person evaluation if the symptoms worsen or if the condition fails to improve as anticipated.    Zaydrian Batta K Tyan Dy, MD   I provided 20 minutes of non face-to-face telephone visit time during this encounter, and > 50% was spent counseling as documented under my assessment & plan.

## 2023-12-14 ENCOUNTER — Telehealth: Payer: Self-pay

## 2023-12-14 LAB — CULTURE, BLOOD (ROUTINE X 2)
Culture: NO GROWTH
Culture: NO GROWTH
Special Requests: ADEQUATE

## 2023-12-14 NOTE — Transitions of Care (Post Inpatient/ED Visit) (Signed)
   12/14/2023  Name: Alan Wise MRN: [id number] DOB: 1970/08/12  Today's TOC FU Call Status: Today's TOC FU Call Status:: Unsuccessful Call (2nd Attempt) Unsuccessful Call (2nd Attempt) Date: 12/14/23  Attempted to reach the patient regarding the most recent Inpatient/ED visit.RN CM left generic VM with call back number.   Follow Up Plan: Additional outreach attempts will be made to reach the patient to complete the Transitions of Care (Post Inpatient/ED visit) call.    Bing Edison MSN, RN RN Case Sales executive Health  [facility] Office Hours M-F [phone] Direct Dial: [phone] Main Phone [phone]  Fax: [phone] [email or web address]

## 2023-12-15 ENCOUNTER — Telehealth: Payer: Self-pay

## 2023-12-15 NOTE — Transitions of Care (Post Inpatient/ED Visit) (Signed)
   12/15/2023  Name: LAYTON NAVES MRN: [id number] DOB: 07/15/1970  Today's TOC FU Call Status: Today's TOC FU Call Status:: Unsuccessful Call (3rd Attempt) Unsuccessful Call (3rd Attempt) Date: 12/15/23  Attempted to reach the patient regarding the most recent Inpatient/ED visit.  Follow Up Plan: Additional outreach attempts will be made to reach the patient to complete the Transitions of Care (Post Inpatient/ED visit) call.   Jenavie Stanczak J. Samil Mecham RN, MSN [facility], [facility] RN Care Manager Direct Dial: [phone]  Fax: [phone] Website: [email or web address]

## 2023-12-16 ENCOUNTER — Encounter: Admitting: Speech Pathology

## 2023-12-16 ENCOUNTER — Telehealth: Payer: Self-pay | Admitting: *Deleted

## 2023-12-16 NOTE — Telephone Encounter (Signed)
 Wife called. States patient started Abilify  on Saturday. Very lethargic, dragging left leg, fell yesterday. About as bad as when you put him in the hospital in March. And barely eating  Dr Buckley says to stop Abilify .  Wife verbalized understanding.

## 2023-12-18 ENCOUNTER — Other Ambulatory Visit: Payer: Self-pay

## 2023-12-18 ENCOUNTER — Telehealth: Payer: Self-pay | Admitting: *Deleted

## 2023-12-18 ENCOUNTER — Encounter (HOSPITAL_COMMUNITY): Payer: Self-pay

## 2023-12-18 ENCOUNTER — Emergency Department (HOSPITAL_COMMUNITY)

## 2023-12-18 ENCOUNTER — Encounter: Admitting: Speech Pathology

## 2023-12-18 ENCOUNTER — Inpatient Hospital Stay (HOSPITAL_COMMUNITY)
Admission: EM | Admit: 2023-12-18 | Discharge: 2023-12-24 | DRG: 065 | Disposition: A | Discharge status: Rehabilitation Facility | Attending: Internal Medicine | Admitting: Internal Medicine

## 2023-12-18 ENCOUNTER — Ambulatory Visit

## 2023-12-18 DIAGNOSIS — H53462 Homonymous bilateral field defects, left side: Secondary | ICD-10-CM | POA: Diagnosis present

## 2023-12-18 DIAGNOSIS — Z85841 Personal history of malignant neoplasm of brain: Principal | ICD-10-CM

## 2023-12-18 DIAGNOSIS — I6381 Other cerebral infarction due to occlusion or stenosis of small artery: Secondary | ICD-10-CM | POA: Diagnosis present

## 2023-12-18 DIAGNOSIS — Z83438 Family history of other disorder of lipoprotein metabolism and other lipidemia: Secondary | ICD-10-CM

## 2023-12-18 DIAGNOSIS — Z923 Personal history of irradiation: Secondary | ICD-10-CM

## 2023-12-18 DIAGNOSIS — Z860100 Personal history of colon polyps, unspecified: Secondary | ICD-10-CM

## 2023-12-18 DIAGNOSIS — I63521 Cerebral infarction due to unspecified occlusion or stenosis of right anterior cerebral artery: Secondary | ICD-10-CM | POA: Diagnosis present

## 2023-12-18 DIAGNOSIS — Y842 Radiological procedure and radiotherapy as the cause of abnormal reaction of the patient, or of later complication, without mention of misadventure at the time of the procedure: Secondary | ICD-10-CM | POA: Diagnosis present

## 2023-12-18 DIAGNOSIS — R4701 Aphasia: Secondary | ICD-10-CM | POA: Diagnosis present

## 2023-12-18 DIAGNOSIS — E43 Unspecified severe protein-calorie malnutrition: Secondary | ICD-10-CM | POA: Diagnosis not present

## 2023-12-18 DIAGNOSIS — Z833 Family history of diabetes mellitus: Secondary | ICD-10-CM | POA: Diagnosis not present

## 2023-12-18 DIAGNOSIS — R1314 Dysphagia, pharyngoesophageal phase: Secondary | ICD-10-CM | POA: Diagnosis present

## 2023-12-18 DIAGNOSIS — Z7901 Long term (current) use of anticoagulants: Secondary | ICD-10-CM

## 2023-12-18 DIAGNOSIS — Z8249 Family history of ischemic heart disease and other diseases of the circulatory system: Secondary | ICD-10-CM

## 2023-12-18 DIAGNOSIS — R296 Repeated falls: Secondary | ICD-10-CM | POA: Diagnosis present

## 2023-12-18 DIAGNOSIS — I6789 Other cerebrovascular disease: Secondary | ICD-10-CM | POA: Diagnosis present

## 2023-12-18 DIAGNOSIS — G8114 Spastic hemiplegia affecting left nondominant side: Secondary | ICD-10-CM | POA: Diagnosis present

## 2023-12-18 DIAGNOSIS — Z79899 Other long term (current) drug therapy: Secondary | ICD-10-CM

## 2023-12-18 DIAGNOSIS — R29704 NIHSS score 4: Secondary | ICD-10-CM | POA: Diagnosis present

## 2023-12-18 DIAGNOSIS — C719 Malignant neoplasm of brain, unspecified: Secondary | ICD-10-CM | POA: Diagnosis not present

## 2023-12-18 DIAGNOSIS — Z85828 Personal history of other malignant neoplasm of skin: Secondary | ICD-10-CM

## 2023-12-18 DIAGNOSIS — G9389 Other specified disorders of brain: Secondary | ICD-10-CM | POA: Diagnosis present

## 2023-12-18 DIAGNOSIS — Z91041 Radiographic dye allergy status: Secondary | ICD-10-CM

## 2023-12-18 DIAGNOSIS — R569 Unspecified convulsions: Secondary | ICD-10-CM | POA: Diagnosis not present

## 2023-12-18 DIAGNOSIS — R351 Nocturia: Secondary | ICD-10-CM | POA: Diagnosis present

## 2023-12-18 DIAGNOSIS — I444 Left anterior fascicular block: Secondary | ICD-10-CM | POA: Diagnosis present

## 2023-12-18 DIAGNOSIS — E785 Hyperlipidemia, unspecified: Secondary | ICD-10-CM | POA: Diagnosis present

## 2023-12-18 DIAGNOSIS — E1169 Type 2 diabetes mellitus with other specified complication: Secondary | ICD-10-CM | POA: Diagnosis present

## 2023-12-18 DIAGNOSIS — R29810 Facial weakness: Secondary | ICD-10-CM | POA: Diagnosis present

## 2023-12-18 DIAGNOSIS — I639 Cerebral infarction, unspecified: Secondary | ICD-10-CM | POA: Diagnosis not present

## 2023-12-18 DIAGNOSIS — Z8673 Personal history of transient ischemic attack (TIA), and cerebral infarction without residual deficits: Secondary | ICD-10-CM

## 2023-12-18 DIAGNOSIS — R531 Weakness: Secondary | ICD-10-CM

## 2023-12-18 DIAGNOSIS — K5901 Slow transit constipation: Secondary | ICD-10-CM | POA: Diagnosis not present

## 2023-12-18 DIAGNOSIS — K219 Gastro-esophageal reflux disease without esophagitis: Secondary | ICD-10-CM | POA: Diagnosis present

## 2023-12-18 DIAGNOSIS — D6869 Other thrombophilia: Secondary | ICD-10-CM | POA: Diagnosis not present

## 2023-12-18 DIAGNOSIS — Z7982 Long term (current) use of aspirin: Secondary | ICD-10-CM

## 2023-12-18 DIAGNOSIS — R112 Nausea with vomiting, unspecified: Secondary | ICD-10-CM | POA: Diagnosis not present

## 2023-12-18 DIAGNOSIS — G4089 Other seizures: Secondary | ICD-10-CM | POA: Diagnosis not present

## 2023-12-18 DIAGNOSIS — R1310 Dysphagia, unspecified: Secondary | ICD-10-CM

## 2023-12-18 DIAGNOSIS — G40909 Epilepsy, unspecified, not intractable, without status epilepticus: Secondary | ICD-10-CM | POA: Diagnosis present

## 2023-12-18 DIAGNOSIS — I63421 Cerebral infarction due to embolism of right anterior cerebral artery: Secondary | ICD-10-CM | POA: Diagnosis not present

## 2023-12-18 DIAGNOSIS — I69391 Dysphagia following cerebral infarction: Secondary | ICD-10-CM | POA: Diagnosis not present

## 2023-12-18 DIAGNOSIS — I10 Essential (primary) hypertension: Secondary | ICD-10-CM | POA: Diagnosis present

## 2023-12-18 DIAGNOSIS — Z823 Family history of stroke: Secondary | ICD-10-CM

## 2023-12-18 DIAGNOSIS — I63529 Cerebral infarction due to unspecified occlusion or stenosis of unspecified anterior cerebral artery: Secondary | ICD-10-CM | POA: Diagnosis not present

## 2023-12-18 DIAGNOSIS — C801 Malignant (primary) neoplasm, unspecified: Secondary | ICD-10-CM | POA: Diagnosis not present

## 2023-12-18 LAB — DIFFERENTIAL
Abs Immature Granulocytes: 0.09 10*3/uL — ABNORMAL HIGH (ref 0.00–0.07)
Basophils Absolute: 0.1 10*3/uL (ref 0.0–0.1)
Basophils Relative: 1 %
Eosinophils Absolute: 0.3 10*3/uL (ref 0.0–0.5)
Eosinophils Relative: 5 %
Immature Granulocytes: 1 %
Lymphocytes Relative: 19 %
Lymphs Abs: 1.3 10*3/uL (ref 0.7–4.0)
Monocytes Absolute: 0.4 10*3/uL (ref 0.1–1.0)
Monocytes Relative: 6 %
Neutro Abs: 4.7 10*3/uL (ref 1.7–7.7)
Neutrophils Relative %: 68 %

## 2023-12-18 LAB — I-STAT CHEM 8, ED
BUN: 19 mg/dL (ref 6–20)
Calcium, Ion: 1.23 mmol/L (ref 1.15–1.40)
Chloride: 98 mmol/L (ref 98–111)
Creatinine, Ser: 1.2 mg/dL (ref 0.61–1.24)
Glucose, Bld: 107 mg/dL — ABNORMAL HIGH (ref 70–99)
HCT: 47 % (ref 39.0–52.0)
Hemoglobin: 16 g/dL (ref 13.0–17.0)
Potassium: 4.6 mmol/L (ref 3.5–5.1)
Sodium: 139 mmol/L (ref 135–145)
TCO2: 31 mmol/L (ref 22–32)

## 2023-12-18 LAB — COMPREHENSIVE METABOLIC PANEL WITH GFR
ALT: 44 U/L (ref 0–44)
AST: 27 U/L (ref 15–41)
Albumin: 4.3 g/dL (ref 3.5–5.0)
Alkaline Phosphatase: 62 U/L (ref 38–126)
Anion gap: 9 (ref 5–15)
BUN: 15 mg/dL (ref 6–20)
CO2: 29 mmol/L (ref 22–32)
Calcium: 9.9 mg/dL (ref 8.9–10.3)
Chloride: 100 mmol/L (ref 98–111)
Creatinine, Ser: 1.14 mg/dL (ref 0.61–1.24)
GFR, Estimated: >60 mL/min (ref >60)
Glucose, Bld: 105 mg/dL — ABNORMAL HIGH (ref 70–99)
Potassium: 4.4 mmol/L (ref 3.5–5.1)
Sodium: 138 mmol/L (ref 135–145)
Total Bilirubin: 0.6 mg/dL (ref 0.0–1.2)
Total Protein: 7.7 g/dL (ref 6.5–8.1)

## 2023-12-18 LAB — CBC
HCT: 47.8 % (ref 39.0–52.0)
Hemoglobin: 16.2 g/dL (ref 13.0–17.0)
MCH: 29.7 pg (ref 26.0–34.0)
MCHC: 33.9 g/dL (ref 30.0–36.0)
MCV: 87.7 fL (ref 80.0–100.0)
Platelets: 287 10*3/uL (ref 150–400)
RBC: 5.45 MIL/uL (ref 4.22–5.81)
RDW: 12.7 % (ref 11.5–15.5)
WBC: 6.9 10*3/uL (ref 4.0–10.5)
nRBC: 0 % (ref 0.0–0.2)

## 2023-12-18 LAB — PROTIME-INR
INR: 1 (ref 0.8–1.2)
Prothrombin Time: 13.4 s (ref 11.4–15.2)

## 2023-12-18 LAB — APTT: aPTT: 34 s (ref 24–36)

## 2023-12-18 LAB — ETHANOL: Alcohol, Ethyl (B): <15 mg/dL (ref <15)

## 2023-12-18 MED ORDER — ACETAMINOPHEN 650 MG RE SUPP: 650.0000 mg | Freq: Four times a day (QID) | RECTAL | Status: DC | PRN

## 2023-12-18 MED ORDER — ATORVASTATIN CALCIUM 40 MG PO TABS
40.0000 mg | ORAL_TABLET | Freq: Every day | ORAL | Status: DC
  Administered 2023-12-20 – 2023-12-24 (×5): 40 mg via ORAL
  Filled 2023-12-18 (×6): qty 1

## 2023-12-18 MED ORDER — AMLODIPINE BESYLATE 5 MG PO TABS
5.0000 mg | ORAL_TABLET | Freq: Every day | ORAL | Status: DC
  Administered 2023-12-19 – 2023-12-24 (×6): 5 mg via ORAL
  Filled 2023-12-18 (×6): qty 1

## 2023-12-18 MED ORDER — PROPRANOLOL HCL 10 MG PO TABS
20.0000 mg | ORAL_TABLET | Freq: Two times a day (BID) | ORAL | Status: DC
  Administered 2023-12-18 – 2023-12-23 (×11): 20 mg via ORAL
  Filled 2023-12-18 (×12): qty 2

## 2023-12-18 MED ORDER — ACETAMINOPHEN 325 MG PO TABS: 650.0000 mg | ORAL_TABLET | Freq: Four times a day (QID) | ORAL | Status: DC | PRN

## 2023-12-18 MED ORDER — STROKE: EARLY STAGES OF RECOVERY BOOK
Freq: Once | Status: AC
  Filled 2023-12-18: qty 1

## 2023-12-18 MED ORDER — LAMOTRIGINE 25 MG PO TABS
150.0000 mg | ORAL_TABLET | Freq: Two times a day (BID) | ORAL | Status: DC
  Administered 2023-12-18: 150 mg via ORAL
  Filled 2023-12-18: qty 2

## 2023-12-18 MED ORDER — ONDANSETRON HCL 4 MG PO TABS: 4.0000 mg | ORAL_TABLET | Freq: Four times a day (QID) | ORAL | Status: DC | PRN

## 2023-12-18 MED ORDER — ASPIRIN 81 MG PO TBEC
81.0000 mg | DELAYED_RELEASE_TABLET | Freq: Every day | ORAL | Status: DC
  Administered 2023-12-19 – 2023-12-22 (×4): 81 mg via ORAL
  Filled 2023-12-18 (×5): qty 1

## 2023-12-18 MED ORDER — ONDANSETRON HCL 4 MG/2ML IJ SOLN
4.0000 mg | Freq: Four times a day (QID) | INTRAMUSCULAR | Status: DC | PRN
Start: 2023-12-18 — End: 2023-12-24

## 2023-12-18 NOTE — Telephone Encounter (Signed)
 Wife called to advise that they stopped the Abilify  on Wednesday (last dose 10:00 am).  Since then patient has had increased left food drag/unable to walk without assistance.  They haven't started the Eliquis  yet.  She is unsure if this is related to stopping medication or if he has had another stoke.  Questioning how to proceed with sudden change.  Routing to Dr Buckley to please advise.

## 2023-12-18 NOTE — Progress Notes (Signed)
 Received pt from ED via stretcher, vital signs taken, hooked to tele monitoring, oriented to room, place call bell within reach.

## 2023-12-18 NOTE — ED Triage Notes (Addendum)
 Wife states pt is dragging left leg and can not move left arm without help from right arm. Hx of strokes that affected left side.  Pt fell Wednesday and unknown if hit head. Hx of aphasia. Denies HA. LSN was sometime yesterday. Wife is unsure exact time due to medication changes.

## 2023-12-18 NOTE — Assessment & Plan Note (Signed)
 Continue blood pressure control with amlodipine  and propranolol 

## 2023-12-18 NOTE — Assessment & Plan Note (Signed)
 Possible focal seizures. Case discussed with neurology  Continue lamotrigine  at increased dose and monitor response.    EEG with cortical dysfunction arising from right hemisphere, maximal right fronto temporal region likely secondary to underlying structural abnormality. No seizures or definite epileptiform discharges.

## 2023-12-18 NOTE — ED Provider Notes (Signed)
 Alan Wise Provider Note   CSN: [id number] Arrival date & time: 12/18/23  1254     Patient presents with: Weakness   Alan Wise is a 53 y.o. male.  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  Patient's wife states that he typically has aphasia and left upper extremity left lower extremity weakness at baseline however she has appreciated new sporadic episodes of severe left-sided weakness over the last 3 days that have seemed to come and go randomly.  He has completed stroke rehab and was able to walk on his own but has been dragging his left leg over the last couple days.  He did suffer a fall 2 days ago which was unwitnessed at home.  He was recently started on Abilify  but has not been taking this medication over the last 2 days as it was making him fatigued.  No fevers chills chest pain shortness of breath vomiting or recent illness.    Weakness      Prior to Admission medications   Medication Sig Start Date End Date Taking? Authorizing Provider  amLODipine  (NORVASC ) 5 MG tablet TAKE 1 TABLET (5 MG TOTAL) BY MOUTH DAILY. 11/05/23  Yes Vaslow, Zachary K, MD  aspirin  EC 81 MG tablet Take 81 mg by mouth daily. Swallow whole.   Yes [provider]  lamoTRIgine  (LAMICTAL ) 150 MG tablet Take 150 mg by mouth 2 (two) times daily. 11/16/23  Yes [provider]  propranolol  (INDERAL ) 20 MG tablet TAKE 1 TABLET BY MOUTH TWICE A DAY 11/17/23  Yes Vaslow, Zachary K, MD  apixaban  (ELIQUIS ) 5 MG TABS tablet Take 1 tablet (5 mg total) by mouth 2 (two) times daily. Patient not taking: Reported on 12/18/2023 12/11/23   Vaslow, Zachary K, MD  ARIPiprazole  (ABILIFY ) 5 MG tablet Take 1 tablet (5 mg total) by mouth daily. Patient not taking: Reported on 12/18/2023 12/11/23   Vaslow, Zachary K, MD    Allergies: Contrast media  [iodinated contrast media]    Review of Systems  Neurological:  Positive for weakness.    Updated Vital Signs BP (!) 143/82   Pulse 63   Temp 97.8 F (36.6 C)   Resp 13   Ht 5' 8 (1.727 m)   Wt 63.5 kg   SpO2 100%   BMI 21.29 kg/m   Physical Exam Vitals and nursing note reviewed.  HENT:     Head: Normocephalic and atraumatic.   Eyes:     Pupils: Pupils are equal, round, and reactive to light.    Cardiovascular:     Rate and Rhythm: Normal rate and regular rhythm.  Pulmonary:     Effort: Pulmonary effort is normal.     Breath sounds: Normal breath sounds.  Abdominal:     Palpations: Abdomen is soft.     Tenderness: There is no abdominal tenderness.   Skin:    General: Skin is warm and dry.   Neurological:     Mental Status: He is alert.     Sensory: No sensory deficit.     Comments: Symmetric 4-5 weakness in bilateral upper and lower extremities Dysarthria and some expressive aphasia Awake alert able to follow commands  Psychiatric:        Mood and Affect: Mood normal.     (all labs ordered are listed, but only abnormal results are displayed) Labs Reviewed  DIFFERENTIAL - Abnormal; Notable for the following components:      Result Value   Abs Immature Granulocytes 0.09 (*)    All other components within normal limits  COMPREHENSIVE METABOLIC PANEL WITH GFR - Abnormal; Notable for the following components:   Glucose, Bld 105 (*)    All other components within normal limits  I-STAT CHEM 8, ED - Abnormal; Notable for the following components:   Glucose, Bld 107 (*)    All other components within normal limits  ETHANOL  PROTIME-INR  APTT  CBC  RAPID URINE DRUG SCREEN, HOSP PERFORMED    EKG: EKG Interpretation Date/Time:  Friday December 18 2023 15:11:36 EDT Ventricular Rate:  56 PR Interval:  155 QRS Duration:  87 QT Interval:  428 QTC Calculation: 413 R Axis:   -5  Text Interpretation: Sinus rhythm ST elev, probable normal early repol pattern  Confirmed by Pamella Sharper [phone]) on 12/18/2023 4:34:44 PM  Radiology: CT HEAD WO CONTRAST Result Date: 12/18/2023 CLINICAL DATA:  Neuro deficit, acute, stroke suspected. History of oligodendroglioma status post treatment. EXAM: CT HEAD WITHOUT CONTRAST TECHNIQUE: Contiguous axial images were obtained from the base of the skull through the vertex without intravenous contrast. RADIATION DOSE REDUCTION: This exam was performed according to the departmental dose-optimization program which includes automated exposure control, adjustment of the mA and/or kV according to patient size and/or use of iterative reconstruction technique. COMPARISON:  CT of the head dated December 09, 2023. FINDINGS: Brain: Moderate generalized is cerebral volume loss. Curvilinear areas of calcification are again demonstrated within the periventricular and subcortical white matter of the right frontal lobe. Dystrophic calcifications are also seen anteromedial to the atrium of the right lateral ventricle. There are encephalomalacia changes and ex vacuo dilatation of the right lateral ventricle, which appears similar to the prior exam. There is no evidence of acute hemorrhage or infarction. Vascular: Moderate calcific atheromatous disease within the carotid siphons and right vertebral artery. Skull: Right frontal burr craniotomy defects. The calvaria is otherwise intact. No osseous lesions present. Sinuses/Orbits: Moderate mucosal disease within the frontal, ethmoid and maxillary sinuses. The orbits are unremarkable. Other: None. IMPRESSION: 1. Stable encephalomalacia changes and dystrophic calcification within the right cerebral hemisphere, compatible with previously treated neoplasm. No adverse interval change. 2. Mild-to-moderate paranasal sinus disease. Electronically Signed   By: [name] M.D.   On: 12/18/2023 15:37     Procedures   Medications Ordered in the ED - No data to display  Clinical Course as of 12/18/23 1634   Fri Dec 18, 2023  1540 I, Sharper Pamella DO, am transitioning care of this patient to the oncoming provider pending results of CT head, MRI brain, remainder of laboratory workup, reevaluation and likely admission [MP]    Clinical Course User Index [MP] Pamella Sharper LABOR, DO                                 Medical Decision Making 53 year old male with history as above presenting to the ED given concern for acute worsening of left-sided weakness.  Episodic left-sided weakness which is worse from his baseline.  Unsteady gait and has been dragging the left leg.  Some dysarthria on my exam but symmetric weakness throughout bilateral upper and lower extremities.  Out of the window for code stroke activation.  No other focal neurologic deficits.  Differential diagnosis includes CVA/TIA, seizure episode, progression of known oligodendroglioma, intracranial trauma after fall and  underlying metabolic disturbance.  Will obtain laboratory workup along with CT head and MRI brain.  Amount and/or Complexity of Data Reviewed Radiology: ordered.        Final diagnoses:  History of oligodendroglioma of brain  History of stroke  Weakness    ED Discharge Orders     None          Pamella Ozell LABOR, DO 12/18/23 1635

## 2023-12-18 NOTE — Assessment & Plan Note (Signed)
 Recurrent acute CVA.   Plan to continue neuro checks q 4 hrs Blood pressure control with amlodipine .  For now will continue on aspirin  and will resume apixaban  when patient improves his balance, per request of his wife. She is worried about falls on anticoagulation  Continue with  statin therapy   Follow up with Pt and Ot  Checking left shoulder radiograph, positive pain with movement.

## 2023-12-18 NOTE — Assessment & Plan Note (Signed)
 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

## 2023-12-18 NOTE — Assessment & Plan Note (Signed)
 No current indication of insulin   Continue with statin therapy

## 2023-12-18 NOTE — ED Notes (Signed)
 Unable to complete a full NIH due to pt being fatigued and unable to follow commands

## 2023-12-18 NOTE — ED Notes (Signed)
Patient transferred to floor at this time.

## 2023-12-18 NOTE — ED Notes (Signed)
 Got patient into a gown on the monitor called CCMD patient is resting with family at bedside and call bell in reach

## 2023-12-18 NOTE — Progress Notes (Signed)
 Pt not available for EEG at moment being transported to inpatient room.  Tech will try back as schedule allows.

## 2023-12-18 NOTE — ED Provider Triage Note (Signed)
 Emergency Medicine Provider Triage Evaluation Note  Alan Wise , a 53 y.o. male  was evaluated in triage.  Pt complains of stroke sxs. Started abilify  5 days ago, but couldn't tolerate it.  Stopped med on Wed, but had an unwitnessed fall 3 days ago. Found on ground in fetal position.  Since then he has had worsening weakness to L side of body.  Hx of stroke with L side deficit, but this is worse.  Is more confused than baseline.  Endorse R side headache.    Review of Systems  Positive: As above Negative: As above  Physical Exam  BP 135/80 (BP Location: Left Arm)   Pulse (!) 57   Temp 97.8 F (36.6 C)   Resp 19   Ht 5' 8 (1.727 m)   Wt 63.5 kg   SpO2 95%   BMI 21.29 kg/m  Gen:   Awake, no distress   Resp:  Normal effort  MSK:   Moves extremities without difficulty  Other:  L side weakness, poor historian, not tracking eye, speech garble  Medical Decision Making  Medically screening exam initiated at 1:49 PM.  Appropriate orders placed.  EZEQUIAS LARD was informed that the remainder of the evaluation will be completed by another provider, this initial triage assessment does not replace that evaluation, and the importance of remaining in the ED until their evaluation is complete.  Stroke sxs, LKN 3 days ago.     Nivia Colon, PA-C 12/18/23 1351

## 2023-12-18 NOTE — H&P (Signed)
 History and Physical    Patient: Alan Wise FMW:[id number] DOB: 18-Feb-1971 DOA: 12/18/2023 DOS: the patient was seen and examined on 12/18/2023 PCP: Antonette Angeline ORN, NP  Patient coming from: Home  Chief Complaint:  Chief Complaint  Patient presents with   Weakness   HPI: Alan Wise is a 53 y.o. male with medical history significant of T2DM, hyperlipidemia, GERD and low grade glioma who presented with acute focal neurologic deficit.  Recent hospitalization at [facility] 06/17 to 12/10/23 for altered mental status, work up was positive for anterior right temporal lobe punctate restricted diffusion per brain MRI. Neurology recommended not change in management.  At the time of his discharge he was back to his baseline, able to ambulate with no assistance and with no focal neurologic deficits.  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 Because lesions keep appearing while taking antiplatelet therapy decision was made to start patient on apixaban . For behavioral changes he was prescribed Abilify .   2 days ago his wife found him on the floor, and helped him to stand back on his feet. The fall was not witnessed but apparently was severe. After the fall he was noted to have significant left sided weakness. He has been dragging his left leg while walking and needing constant help to keep his balance in order to prevent falling down.  His left upper extremity has been weak as well but not as severe as his left leg. He has been more somnolent and less reactive.   Note that patient only took Abilify  for 2 days (June 21 and 22) due to somnolence and he has not started yet apixaban .   Most information obtained from patient's wife at the bedside, patient not able to give detailed history due to cognitive impairment.   Review of Systems: unable to review all systems due to the  inability of the patient to answer questions. Past Medical History:  Diagnosis Date   Allergy    Complication of anesthesia    pt reports he is starting to have more difficulty arousing after surgery   Diabetes mellitus (HCC)    GERD (gastroesophageal reflux disease)    Headache    History of kidney stones    Hyperlipidemia    Renal disorder    kidney stones   Past Surgical History:  Procedure Laterality Date   APPLICATION OF CRANIAL NAVIGATION Right 01/17/2021   Procedure: APPLICATION OF CRANIAL NAVIGATION;  Surgeon: Debby Dorn MATSU, MD;  Location: [facility] OR;  Service: Neurosurgery;  Laterality: Right;   COLONOSCOPY  09/03/1994   COLONOSCOPY WITH PROPOFOL  N/A 01/07/2023   Procedure: COLONOSCOPY WITH PROPOFOL ;  Surgeon: Jinny Carmine, MD;  Location: [facility] ENDOSCOPY;  Service: Endoscopy;  Laterality: N/A;  NOT TOO EARLY   CYSTOSCOPY WITH STENT PLACEMENT Right 01/25/2016   Procedure: CYSTOSCOPY WITH STENT PLACEMENT;  Surgeon: Redell Lynwood Napoleon, MD;  Location: [facility] ORS;  Service: Urology;  Laterality: Right;   FRAMELESS  BIOPSY WITH BRAINLAB Right 01/17/2021   Procedure: RIGHT STEREOTACTIC BIOPSY OF INSULAR LESION;  Surgeon: Debby Dorn MATSU, MD;  Location: [facility] OR;  Service: Neurosurgery;  Laterality: Right;   KNEE ARTHROSCOPY W/ MENISCAL REPAIR Right 06/23/1988   POLYPECTOMY  01/07/2023   Procedure: POLYPECTOMY;  Surgeon: Jinny Carmine, MD;  Location: [facility] ENDOSCOPY;  Service: Endoscopy;;   removal of birthmark  06/08/2013   SKIN CANCER EXCISION  06/23/2012   TYMPANOSTOMY TUBE PLACEMENT  08/06/1978   URETEROSCOPY WITH HOLMIUM LASER LITHOTRIPSY Right 01/25/2016   Procedure: URETEROSCOPY WITH HOLMIUM LASER LITHOTRIPSY;  Surgeon: Redell Lynwood Napoleon, MD;  Location: [facility] ORS;  Service: Urology;  Laterality: Right;   URETHRAL STRICTURE DILATATION     visual inspection of vocal cord     1973   WISDOM TOOTH EXTRACTION     Social History:  reports that he has never smoked. He has never used  smokeless tobacco. He reports that he does not currently use alcohol. He reports that he does not use drugs.  Allergies  Allergen Reactions   Contrast Media [Iodinated Contrast Media] Swelling    Family History  Problem Relation Age of Onset   Hyperlipidemia Father    Hypertension Father    Diabetes Father    Benign prostatic hyperplasia Father    Stroke Maternal Grandmother    Diabetes Maternal Grandmother    Stroke Paternal Grandmother    Hypertension Paternal Grandmother    Kidney disease Neg Hx    Prostate cancer Neg Hx     Prior to Admission medications   Medication Sig Start Date End Date Taking? Authorizing Provider  amLODipine  (NORVASC ) 5 MG tablet TAKE 1 TABLET (5 MG TOTAL) BY MOUTH DAILY. 11/05/23  Yes Vaslow, Zachary K, MD  aspirin  EC 81 MG tablet Take 81 mg by mouth daily. Swallow whole.   Yes [provider]  lamoTRIgine  (LAMICTAL ) 150 MG tablet Take 150 mg by mouth 2 (two) times daily. 11/16/23  Yes [provider]  propranolol  (INDERAL ) 20 MG tablet TAKE 1 TABLET BY MOUTH TWICE A DAY 11/17/23  Yes Vaslow, Zachary K, MD  apixaban  (ELIQUIS ) 5 MG TABS tablet Take 1 tablet (5 mg total) by mouth 2 (two) times daily. Patient not taking: Reported on 12/18/2023 12/11/23   Vaslow, Zachary K, MD  ARIPiprazole  (ABILIFY ) 5 MG tablet Take 1 tablet (5 mg total) by mouth daily. Patient not taking: Reported on 12/18/2023 12/11/23   Buckley Arthea POUR, MD    Physical Exam: Vitals:   12/18/23 1259 12/18/23 1339 12/18/23 1610  BP: 135/80  (!) 143/82  Pulse: (!) 57  63  Resp: 19  13  Temp: 97.8 F (36.6 C)    SpO2: 95%  100%  Weight:  63.5 kg   Height:  5' 8 (1.727 m)    BP (!) 143/82   Pulse 63   Temp 97.8 F (36.6 C)   Resp 13   Ht 5' 8 (1.727 m)   Wt 63.5 kg   SpO2 100%   BMI 21.29 kg/m   Neurology awake and alert, able to respond to simple question and follow simple commands. Mild confusion and not able to give detailed history, speech not slurred  and cranial nerves 2 to 12 intact. Positive left upper and lower extremity proximal weakness, 4/5 left arm and 3/5 left leg.  Preserved hand grip.  ENT with mild pallor Cardiovascular with S1 and S2 present and regular with no gallops, rubs or murmurs Respiratory with no rales or wheezing, no rhonchi  Abdomen with no distention  No lower extremity edema   Data Reviewed:   Na 138, K 4,4 Cl 100, bicarbonate 29 glucose 105 bun 15 cr 1,14 AST 27 ALT 44  Wbc 6,9 hgb 16.2 plt 287  Alcohol < 15   CT head with stable encephalomalacia changes and dystrophic calcification within the right cerebral hemisphere, compatible with previously treated neoplasm. No adverse interval change.  Brain MRI with 3.2 x 1.3 focus  of patchy restricted diffusion within the right basal ganglia, new from the recent prior brain MRI of 12/09/23. New punctate focus of restricted diffusion within the right caudate head.  Small focus of restricted diffusion demonstrated within the right temporal lobe on prior MRI is no longer present.   EKG 56 bpm, left axis deviation, left anterior fascicular block, qtc 413, sinus rhythm with no significant ST segment or T wave changes.    Assessment and Plan: Acute CVA (cerebrovascular accident) (HCC) Recurrent acute CVA.   Plan to continue neuro checks q 4 hrs Blood pressure control with amlodipine .  For now will continue on aspirin  and will follow up on Neurology recommendations, may need clopidogrel Add statin therapy  He had a recent vascular study, probably no need to repeat.  Follow up with Pt and Ot recommendations   Seizure disorder (HCC) No signs of active generalized seizures.  Continue lamotrigine   Patient had high risk for seizures, will check EEG   Essential hypertension Continue blood pressure control with amlodipine  and propranolol    Type 2 diabetes mellitus with hyperlipidemia (HCC) Admission glucose is not elevated, will continue to check CBG as needed.   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     Advance Care Planning:   Code Status: Full Code   Consults: Neurology   Family Communication: I spoke with patient's wife at the bedside, we talked in detail about patient's condition, plan of care and prognosis and all questions were addressed.   Severity of Illness: The appropriate patient status for this patient is INPATIENT. Inpatient status is judged to be reasonable and necessary in order to provide the required intensity of service to ensure the patient's safety. The patient's presenting symptoms, physical exam findings, and initial radiographic and laboratory data in the context of their chronic comorbidities is felt to place them at high risk for further clinical deterioration. Furthermore, it is not anticipated that the patient will be medically stable for discharge from the hospital within 2 midnights of admission.   * I certify that at the point of admission it is my clinical judgment that the patient will require inpatient hospital care spanning beyond 2 midnights from the point of admission due to high intensity of service, high risk for further deterioration and high frequency of surveillance required.*  Author: Elidia Toribio Furnace, MD 12/18/2023 5:24 PM  For on call review [email or web address].

## 2023-12-18 NOTE — Telephone Encounter (Signed)
 Spoke with Dr Buckley and he advised that With new or progressive weakness ED would be reasonable for stroke eval  Spoke with wife and she advised will take him to [facility].

## 2023-12-18 NOTE — ED Provider Notes (Signed)
  Physical Exam  BP 135/80 (BP Location: Left Arm)   Pulse (!) 57   Temp 97.8 F (36.6 C)   Resp 19   Ht 5' 8 (1.727 m)   Wt 63.5 kg   SpO2 95%   BMI 21.29 kg/m   Physical Exam  Procedures  Procedures  ED Course / MDM   Clinical Course as of 12/18/23 1656  Fri Dec 18, 2023  1540 I, Ozell Marine DO, am transitioning care of this patient to the oncoming provider pending results of CT head, MRI brain, remainder of laboratory workup, reevaluation and likely admission [MP]    Clinical Course User Index [MP] Marine Ozell LABOR, DO   Medical Decision Making Amount and/or Complexity of Data Reviewed Radiology: ordered.  Risk Decision regarding hospitalization.   Patient with previous stroke.  Also reported previous MRI 9 days ago showed possible stroke versus glioma.  Had been admitted for that.  Also history of seizures.  Discussed with Dr. Buckley is familiar with the patient.  Recommend potentially more anticoagulation.  States likely new changes will be sent in MRI.   New MRI showed likely new stroke.  Discussed with Dr. Voncile who will see patient consult.  Will discuss with hospitalist for admission.          Alan Lot, MD 12/18/23 [phone]

## 2023-12-19 ENCOUNTER — Inpatient Hospital Stay (HOSPITAL_COMMUNITY)

## 2023-12-19 DIAGNOSIS — G40909 Epilepsy, unspecified, not intractable, without status epilepticus: Secondary | ICD-10-CM | POA: Diagnosis not present

## 2023-12-19 DIAGNOSIS — R569 Unspecified convulsions: Secondary | ICD-10-CM

## 2023-12-19 DIAGNOSIS — I10 Essential (primary) hypertension: Secondary | ICD-10-CM | POA: Diagnosis not present

## 2023-12-19 DIAGNOSIS — I639 Cerebral infarction, unspecified: Secondary | ICD-10-CM

## 2023-12-19 DIAGNOSIS — E1169 Type 2 diabetes mellitus with other specified complication: Secondary | ICD-10-CM | POA: Diagnosis not present

## 2023-12-19 DIAGNOSIS — E785 Hyperlipidemia, unspecified: Secondary | ICD-10-CM

## 2023-12-19 DIAGNOSIS — D6869 Other thrombophilia: Secondary | ICD-10-CM

## 2023-12-19 DIAGNOSIS — C801 Malignant (primary) neoplasm, unspecified: Secondary | ICD-10-CM | POA: Diagnosis not present

## 2023-12-19 DIAGNOSIS — G4089 Other seizures: Secondary | ICD-10-CM

## 2023-12-19 DIAGNOSIS — R531 Weakness: Secondary | ICD-10-CM | POA: Diagnosis not present

## 2023-12-19 DIAGNOSIS — C719 Malignant neoplasm of brain, unspecified: Secondary | ICD-10-CM

## 2023-12-19 LAB — LIPID PANEL
Cholesterol: 178 mg/dL (ref 0–200)
HDL: 27 mg/dL — ABNORMAL LOW (ref >40)
LDL Cholesterol: 129 mg/dL — ABNORMAL HIGH (ref 0–99)
Total CHOL/HDL Ratio: 6.6 ratio
Triglycerides: 110 mg/dL (ref <150)
VLDL: 22 mg/dL (ref 0–40)

## 2023-12-19 LAB — RAPID URINE DRUG SCREEN, HOSP PERFORMED
Amphetamines: NOT DETECTED
Barbiturates: NOT DETECTED
Benzodiazepines: NOT DETECTED
Cocaine: NOT DETECTED
Opiates: NOT DETECTED
Tetrahydrocannabinol: NOT DETECTED

## 2023-12-19 LAB — BASIC METABOLIC PANEL WITH GFR
Anion gap: 10 (ref 5–15)
BUN: 13 mg/dL (ref 6–20)
CO2: 27 mmol/L (ref 22–32)
Calcium: 9.3 mg/dL (ref 8.9–10.3)
Chloride: 103 mmol/L (ref 98–111)
Creatinine, Ser: 0.9 mg/dL (ref 0.61–1.24)
GFR, Estimated: >60 mL/min (ref >60)
Glucose, Bld: 75 mg/dL (ref 70–99)
Potassium: 3.9 mmol/L (ref 3.5–5.1)
Sodium: 140 mmol/L (ref 135–145)

## 2023-12-19 LAB — CBC
HCT: 43.5 % (ref 39.0–52.0)
Hemoglobin: 14.7 g/dL (ref 13.0–17.0)
MCH: 29.1 pg (ref 26.0–34.0)
MCHC: 33.8 g/dL (ref 30.0–36.0)
MCV: 86 fL (ref 80.0–100.0)
Platelets: 247 10*3/uL (ref 150–400)
RBC: 5.06 MIL/uL (ref 4.22–5.81)
RDW: 12.3 % (ref 11.5–15.5)
WBC: 6.9 10*3/uL (ref 4.0–10.5)
nRBC: 0 % (ref 0.0–0.2)

## 2023-12-19 MED ORDER — LAMOTRIGINE 25 MG PO TABS
150.0000 mg | ORAL_TABLET | Freq: Every day | ORAL | Status: DC
  Administered 2023-12-19 – 2023-12-24 (×6): 150 mg via ORAL
  Filled 2023-12-19 (×6): qty 2

## 2023-12-19 MED ORDER — LAMOTRIGINE 100 MG PO TABS
200.0000 mg | ORAL_TABLET | Freq: Every day | ORAL | Status: DC
  Administered 2023-12-19 – 2023-12-23 (×5): 200 mg via ORAL
  Filled 2023-12-19 (×5): qty 2

## 2023-12-19 NOTE — Consult Note (Signed)
 NEUROLOGY CONSULT NOTE   Date of service: December 19, 2023 Patient Name: Alan Wise MRN:  [id number] DOB:  10/26/1970 Chief Complaint: left sided weakness, somnolence Requesting Provider: Noralee Elidia Sieving,*  History of Present Illness  Alan Wise is a 53 y.o. male with hx of cryptogenic stroke, oligodendroglioma s/p radiation and previously on Avastin , seizures on lamotrigine ,   Of note on neuro-oncology evaluation (Dr. Buckley 09/22/2023) there was concern for clinical syndrome of focal and generalized decline most likely multifactorial secondary to radiation necrosis, delayed radiation cytotoxicity, breakthrough seizures and strokes  He was to start Eliquis  on 09/15/2018, however it appears he reverted to aspirin  per oncology note from 12/07/2023.    On 12/11/2023 he was again seen by oncology for bouts of rage (severe agitation and irritability), for which Abilify  was started.  He was recommended to restart Eliquis  due to failure of aspirin  in the past, repeated hospitalizations and concern for possible hypercoagulable state locally from radiation injury.  Eliquis  had not yet been started because of his sensitivity to medications; wife wanted to have him stabilized on Abilify  before starting Eliquis , due to the significant effects of his agitation. However Abilify  was stopped after 5 days (June 21 - June 25) due to somnolence.  Subsequently 6/26 he was discovered to have new left-sided weakness.    Of note this weakness seems to have been fluctuating quite a bit in the last few days, at times he seems almost totally unable to move the left arm and leg, this morning he is moving much better  Medication adherence is complicated by the patient's intermittent medication refusal.  He is also noted to be very sensitive to medications  He was supposed to be on lamotrigine  100 in the morning, 200 at night after recent breakthrough seizures but because of his reluctance to take medications  / hesitancy with medication changes he had been getting only 150 twice a day  LKW: 6/25 Modified rankin score: 3-Moderate disability-requires help but walks WITHOUT assistance IV Thrombolysis: No, out of the window EVT: No, exam not consistent with new LVO   NIHSS components Score: Comment  1a Level of Conscious 0[x]  1[]  2[]  3[]      1b LOC Questions 0[]  1[x]  2[]       1c LOC Commands 0[x]  1[]  2[]       2 Best Gaze 0[]  1[x]  2[]       3 Visual 0[]  1[]  2[x]  3[]      4 Facial Palsy 0[]  1[]  2[x]  3[]      5a Motor Arm - left 0[]  1[x]  2[]  3[]  4[]  UN[]    5b Motor Arm - Right 0[x]  1[]  2[]  3[]  4[]  UN[]    6a Motor Leg - Left 0[]  1[x]  2[]  3[]  4[]  UN[]    6b Motor Leg - Right 0[x]  1[]  2[]  3[]  4[]  UN[]    7 Limb Ataxia 0[x]  1[]  2[]  UN[]      8 Sensory 0[x]  1[]  2[]  UN[]      9 Best Language 0[]  1[x]  2[]  3[]      10 Dysarthria 0[]  1[x]  2[]  UN[]      11 Extinct. and Inattention 0[]  1[x]  2[]       TOTAL:       ROS  Obtained from family at bedside as able, limited from patient's secondary to his baseline cognitive and speech difficulties  Past History   Past Medical History:  Diagnosis Date   Allergy    Complication of anesthesia    pt reports he is starting to have more difficulty arousing after surgery  Diabetes mellitus (HCC)    GERD (gastroesophageal reflux disease)    Headache    History of kidney stones    Hyperlipidemia    Renal disorder    kidney stones    Past Surgical History:  Procedure Laterality Date   APPLICATION OF CRANIAL NAVIGATION Right 01/17/2021   Procedure: APPLICATION OF CRANIAL NAVIGATION;  Surgeon: Debby Dorn MATSU, MD;  Location: [facility] OR;  Service: Neurosurgery;  Laterality: Right;   COLONOSCOPY  09/03/1994   COLONOSCOPY WITH PROPOFOL  N/A 01/07/2023   Procedure: COLONOSCOPY WITH PROPOFOL ;  Surgeon: Jinny Carmine, MD;  Location: [facility] ENDOSCOPY;  Service: Endoscopy;  Laterality: N/A;  NOT TOO EARLY   CYSTOSCOPY WITH STENT PLACEMENT Right 01/25/2016   Procedure: CYSTOSCOPY  WITH STENT PLACEMENT;  Surgeon: Redell Lynwood Napoleon, MD;  Location: [facility] ORS;  Service: Urology;  Laterality: Right;   FRAMELESS  BIOPSY WITH BRAINLAB Right 01/17/2021   Procedure: RIGHT STEREOTACTIC BIOPSY OF INSULAR LESION;  Surgeon: Debby Dorn MATSU, MD;  Location: [facility] OR;  Service: Neurosurgery;  Laterality: Right;   KNEE ARTHROSCOPY W/ MENISCAL REPAIR Right 06/23/1988   POLYPECTOMY  01/07/2023   Procedure: POLYPECTOMY;  Surgeon: Jinny Carmine, MD;  Location: [facility] ENDOSCOPY;  Service: Endoscopy;;   removal of birthmark  06/08/2013   SKIN CANCER EXCISION  06/23/2012   TYMPANOSTOMY TUBE PLACEMENT  08/06/1978   URETEROSCOPY WITH HOLMIUM LASER LITHOTRIPSY Right 01/25/2016   Procedure: URETEROSCOPY WITH HOLMIUM LASER LITHOTRIPSY;  Surgeon: Redell Lynwood Napoleon, MD;  Location: [facility] ORS;  Service: Urology;  Laterality: Right;   URETHRAL STRICTURE DILATATION     visual inspection of vocal cord     1973   WISDOM TOOTH EXTRACTION      Family History: Family History  Problem Relation Age of Onset   Hyperlipidemia Father    Hypertension Father    Diabetes Father    Benign prostatic hyperplasia Father    Stroke Maternal Grandmother    Diabetes Maternal Grandmother    Stroke Paternal Grandmother    Hypertension Paternal Grandmother    Kidney disease Neg Hx    Prostate cancer Neg Hx     Social History  reports that he has never smoked. He has never used smokeless tobacco. He reports that he does not currently use alcohol. He reports that he does not use drugs.  Allergies  Allergen Reactions   Contrast Media [Iodinated Contrast Media] Swelling    Medications   Current Facility-Administered Medications:     stroke: early stages of recovery book, , Does not apply, Once, Arrien, Elidia Sieving, MD   acetaminophen  (TYLENOL ) tablet 650 mg, 650 mg, Oral, Q6H PRN **OR** acetaminophen  (TYLENOL ) suppository 650 mg, 650 mg, Rectal, Q6H PRN, Arrien, Elidia Sieving, MD   amLODipine  (NORVASC ) tablet  5 mg, 5 mg, Oral, Daily, Arrien, Mauricio Daniel, MD   aspirin  EC tablet 81 mg, 81 mg, Oral, Daily, Arrien, Mauricio Daniel, MD   atorvastatin  (LIPITOR) tablet 40 mg, 40 mg, Oral, Daily, Arrien, Elidia Sieving, MD   lamoTRIgine  (LAMICTAL ) tablet 150 mg, 150 mg, Oral, BID, Arrien, Mauricio Daniel, MD, 150 mg at 12/18/23 2154   ondansetron  (ZOFRAN ) tablet 4 mg, 4 mg, Oral, Q6H PRN **OR** ondansetron  (ZOFRAN ) injection 4 mg, 4 mg, Intravenous, Q6H PRN, Arrien, Elidia Sieving, MD   propranolol  (INDERAL ) tablet 20 mg, 20 mg, Oral, BID, Arrien, Elidia Sieving, MD, 20 mg at 12/18/23 2154  Vitals   Vitals:   12/18/23 1930 12/18/23 1945 12/18/23 2011 12/18/23 2351  BP: (!) 138/110 124/71 (!) 141/86  129/80  Pulse: 66 62 68 (!) 52  Resp: (!) 22 14 18 18   Temp:   98.2 F (36.8 C) 97.6 F (36.4 C)  TempSrc:   Oral Oral  SpO2: 100% 97% 96% 96%  Weight:      Height:        Body mass index is 21.29 kg/m.   Physical Exam   Constitutional: Appears thin, no acute distress Psych: Affect flat, intermittently frustrated by communication difficulties Eyes: No scleral injection.  HENT: No OP obstruction.  Normocephalic Respiratory: Effort normal, non-labored breathing.   Neurologic Examination   See NIH stroke scale above.  He is awake, alert, able to state his age but not month.  Moderate expressive aphasia, minimal receptive aphasia.  Mild to moderate neglect of the left side  Stable left hemianopia.  Pupils equal round reactive to light.  Will track to midline but not beyond.  Left facial droop.  Hearing intact to voice  4+ to 5 in the left upper and lower extremity in an upper motor neuron pattern 5 throughout the right side  Finger-to-nose and heel-to-shin intact bilaterally within limits of weakness  Labs/Imaging/Neurodiagnostic studies   CBC:  Recent Labs  Lab 12-27-2023 1425 2023/12/27 1440  WBC  --  6.9  NEUTROABS  --  4.7  HGB 16.0 16.2  HCT 47.0 47.8  MCV  --  87.7  PLT   --  287   Basic Metabolic Panel:  Lab Results  Component Value Date   NA 138 12/27/2023   K 4.4 2023-12-27   CO2 29 12-27-23   GLUCOSE 105 (H) 2023/12/27   BUN 15 12/27/23   CREATININE 1.14 Dec 27, 2023   CALCIUM  9.9 12/27/2023   GFRNONAA >60 2023-12-27   GFRAA 123 12/05/2020   Lipid Panel:  Lab Results  Component Value Date   LDLCALC 81 12/09/2023   HgbA1c:  Lab Results  Component Value Date   HGBA1C 5.4 09/16/2023   Urine Drug Screen:     Component Value Date/Time   LABOPIA NONE DETECTED 12/09/2023 0025   COCAINSCRNUR NONE DETECTED 12/09/2023 0025   LABBENZ NONE DETECTED 12/09/2023 0025   AMPHETMU POSITIVE (A) 12/09/2023 0025   THCU NONE DETECTED 12/09/2023 0025   LABBARB NONE DETECTED 12/09/2023 0025    Alcohol Level     Component Value Date/Time   ETH <15 12-27-2023 1418   INR  Lab Results  Component Value Date   INR 1.0 12-27-23   APTT  Lab Results  Component Value Date   APTT 34 December 27, 2023   AED levels:  Lab Results  Component Value Date   LAMOTRIGINE  10.8 09/14/2023   CT angio Head and Neck with contrast (12/09/2023) No CT evidence of acute intracranial abnormality.   Heterogeneous areas of mineralization and surrounding edema in the right frontal lobe and right temporal periventricular white matter corresponding to regions of tumor noted on same day MRI.   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   Intracranial arterial vasculature is otherwise patent.   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 Mild-to-moderate multifocal stenosis of an M2 inferior division branch of the right MCA.   No high-grade stenosis of the arteries in the neck.  MRI Brain(Personally reviewed): 1. 3.2 x 1.3 cm focus of patchy restricted diffusion within the right basal ganglia, new from the recent prior brain MRI of 12/09/2023. New punctate focus of restricted diffusion within the right caudate head. Given the  appearance  and rapid development, these findings are strongly favored to reflect acute infarcts. However, a short-interval follow-up brain MRI (in 6 weeks) is recommended to exclude any tumor progression. 2. A small focus of restricted diffusion demonstrated within the right temporal lobe on the prior MRI is no longer present. This likely reflected an acute infarct on the prior exam. 3. Otherwise stable appearance of the patient's known tumor and post-treatment changes. 4. Paranasal sinus disease as described.  ASSESSMENT   Alan Wise is a 53 y.o. male presenting with fluctuating left-sided weakness versus potentially neglect in the setting of his known malignancy and concern for diffusion restriction on MRI  Differential for the restricted diffusion includes stroke, tumor progression or potentially focal seizure activity.  Given the variability described in the weakness, query whether he may be having some focal seizures.  Certainly we know at his current dose of antiseizure medications he is inadequately controlled (but was unwilling to take any medications for a while which created a great deal of caution in changing any of his medications).  Wife thinks he will be willing to increase lamotrigine  and patient agrees during our conversation; will increase lamotrigine  as previously planned.  This should address the potential for focal seizures as well as hopefully have additional mood stabilization effects  Certainly with concern for recurrent strokes and concern for possible localized hypercoagulable state secondary to radiation-induced blood vessel changes, I do think it is reasonable to resume Eliquis .  Wife would prefer to hold off until his gait is steadily better due to falls risk and bleeding risk with falls and I think that this is a reasonable.  Strokes are small enough and there has been several days of symptoms, so feel that it is safe to resume as soon as wife feels that his  gait is stable enough.  We did discuss that there is a risk of intracerebral hemorrhage but this is felt to be outweighed by the benefit of anticoagulation  RECOMMENDATIONS   #Fluctuating left-sided weakness #Focal seizures #Mood dysregulation -Increase lamotrigine  to 150 mg in the morning, 200 mg at bedtime -Resume Eliquis  at discretion of patient and patient's wife (once gait is more stable) -No need for further inpatient neurological workup at this time, inpatient neurology will sign off for now.  Please reach out if additional questions or concerns arise ______________________________________________________________________    Lola Jernigan MD-PhD [facility] [phone] Available 7 PM to 7 AM, outside of these hours please call Neurologist on call as listed on Amion.

## 2023-12-19 NOTE — Procedures (Signed)
 Patient Name: Alan Wise  MRN: [id number]  Epilepsy Attending: Arlin MALVA Krebs  Referring Physician/Provider: Noralee Elidia Sieving, MD  Date: 12/19/2023  Duration: 27.36 mins  Patient history: 53 y.o. 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 EEG to evaluate for seizure  Level of alertness: Awake, asleep  AEDs during EEG study: LTG  Technical aspects: This EEG study was done with scalp electrodes positioned according to the 10-20 International system of electrode placement. Electrical activity was reviewed with band pass filter of 1-70Hz , sensitivity of 7 uV/mm, display speed of 51mm/sec with a 60Hz  notched filter applied as appropriate. EEG data were recorded continuously and digitally stored.  Video monitoring was available and reviewed as appropriate.  Description: The posterior dominant rhythm consists of 8 Hz activity of moderate voltage (25-35 uV) seen predominantly in posterior head regions, symmetric and reactive to eye opening and eye closing. Sleep was characterized by vertex waves, sleep spindles (12 to 14 Hz), maximal frontocentral region. EEG showed continuous 3 to 6 Hz theta-delta slowing in right hemisphere, maximal right fronto-temporal region. Hyperventilation and photic stimulation were not performed.     ABNORMALITY - Continuous slow, right hemisphere, maximal right fronto-temporal region  IMPRESSION: This study is suggestive of cortical dysfunction arising from right hemisphere, maximal right fronto-temporal region likely secondary to underlying structural abnormality. No seizures or definite epileptiform discharges were seen throughout the recording.  Karlis Cregg O Axxel Gude

## 2023-12-19 NOTE — Progress Notes (Signed)
 Inpatient Rehab Admissions Coordinator Note:   Per therapy patient was screened for CIR candidacy by Season Astacio SHAUNNA Yvone Cohens, CCC-SLP. At this time, pt appears to be a potential candidate for CIR. I will place an order for rehab consult for full assessment, per our protocol.  Please contact me any with questions.SABRA Tinnie Yvone Cohens, MS, CCC-SLP Admissions Coordinator [phone] 12/19/23 5:53 PM

## 2023-12-19 NOTE — Evaluation (Signed)
 Occupational Therapy Evaluation Patient Details Name: Alan Wise MRN: [id number] DOB: 08/25/1970 Today's Date: 12/19/2023   History of Present Illness   53 y.o. male presents to [facility] 12/18/23 after falling two days prior with residual L sided weakness and somnolence. Brain MRI showed new patchy restricted diffusion in R basal ganglia, new punctuate focus of restricted diffusion in R caudate head. Recent admit 6/17-6/19 with anterior R temporal lobe punctate restricted diffusion. PMHx: T2DM, GERD, CVA w/ L sided weakness, oligodendroglioma     Clinical Impressions Pt admitted for above, PTA pt spouse reports him as being ind with mobility and ADLs, has had a steady decline recently. Pt currently presenting with listed deficits, noted to have increased challenges with aphasia and LUE from his baseline per spouse. Pt LUE presents with impaired GMC and FMC, also noted to have pain with shoulder ROM- he did fall last week and reports shoulder pain to have started within the last couple of days. Pt also unsteady compared to his baseline, needing min A to mod A to ambulate with 2 LOBs noted to the R. OT to continue following pt acutely to address listed deficits. Patient has the potential to reach Mod I and demos the ability to tolerate 3 hours of therapy. Pt would benefit from an intensive rehab program to help maximize functional independence.      If plan is discharge home, recommend the following:   A little help with walking and/or transfers;A little help with bathing/dressing/bathroom;Help with stairs or ramp for entrance;Assistance with cooking/housework;Direct supervision/assist for medications management;Supervision due to cognitive status;Direct supervision/assist for financial management     Functional Status Assessment   Patient has had a recent decline in their functional status and demonstrates the ability to make significant improvements in function in a reasonable and  predictable amount of time.     Equipment Recommendations   None recommended by OT     Recommendations for Other Services         Precautions/Restrictions   Precautions Precautions: Fall Recall of Precautions/Restrictions: Impaired Precaution/Restrictions Comments: L visual field cut at baseline Restrictions Weight Bearing Restrictions Per Provider Order: No     Mobility Bed Mobility Overal bed mobility: Needs Assistance Bed Mobility: Supine to Sit, Sit to Supine     Supine to sit: Contact guard, Used rails Sit to supine: Contact guard assist        Transfers Overall transfer level: Needs assistance Equipment used: None Transfers: Sit to/from Stand Sit to Stand: +2 safety/equipment, Contact guard assist           General transfer comment: STS from EOB with CGA      Balance Overall balance assessment: Needs assistance Sitting-balance support: Feet supported Sitting balance-Leahy Scale: Fair     Standing balance support: During functional activity, Single extremity supported Standing balance-Leahy Scale: Fair Standing balance comment: 2 LOBs to the R.                           ADL either performed or assessed with clinical judgement   ADL Overall ADL's : Needs assistance/impaired Eating/Feeding: Set up;Sitting   Grooming: Standing;Contact guard assist;Wash/dry face;Oral care   Upper Body Bathing: Sitting;Set up   Lower Body Bathing: Sitting/lateral leans;Set up   Upper Body Dressing : Sitting;Moderate assistance   Lower Body Dressing: Sitting/lateral leans;Moderate assistance   Toilet Transfer: Minimal assistance;Ambulation Toilet Transfer Details (indicate cue type and reason): min A to mod A Toileting- Clothing Manipulation  and Hygiene: Sit to/from stand;Minimal assistance       Functional mobility during ADLs: Minimal assistance (to mod A) General ADL Comments: Pt with 2 LOBs to the R needing mod A to correct      Vision Baseline Vision/History:  (L field cut) Ability to See in Adequate Light: 0 Adequate Patient Visual Report: Other (comment) (no particular comments noted, hard to understand pt when asked given his aphasia.) Vision Assessment?: Vision impaired- to be further tested in functional context Eye Alignment: Within Functional Limits Ocular Range of Motion: Within Functional Limits Alignment/Gaze Preference: Within Defined Limits Visual Fields: Left visual field deficit Additional Comments: L field cut in L eye at baseline, pt did not note any difference with vision of R eye.     Perception Perception: Within Functional Limits (No noted inattention, picking up objects on cluttered counter accuractely)       Praxis Praxis: [facility]       Pertinent Vitals/Pain Pain Assessment Pain Assessment: No/denies pain     Extremity/Trunk Assessment Upper Extremity Assessment Upper Extremity Assessment: LUE deficits/detail (RUE WFL, strength 5/5, ROM and coordination WFL.) LUE Deficits / Details: Pt reports change in function, LUE only able to touch side of mouth (R), shoulder flexion ~ 70 deg AROM prior to onset of pain but has more with AAROM/PROM (still pain limiting), shoulder flexion MMT 3/5, weak grip compared to R, elbow flexion/ext 4+/5 MMT, 1/4 MAS slight elbow flexor tone. LUE: Shoulder pain with ROM LUE Sensation: WNL LUE Coordination: decreased fine motor;decreased gross motor   Lower Extremity Assessment Lower Extremity Assessment: Defer to PT evaluation       Communication Communication Communication: Impaired Factors Affecting Communication: Difficulty expressing self;Reduced clarity of speech   Cognition Arousal: Alert Behavior During Therapy: WFL for tasks assessed/performed Cognition: Difficult to assess Difficult to assess due to: Impaired communication (expressive aphasia.)           OT - Cognition Comments: Seems to have insight into balance deficits and new  LUE deficits compared to his baseline.                 Following commands: Intact       Cueing  General Comments   Cueing Techniques: Verbal cues  Pt spouse present and supportive. Would like imaging of L shoulder given his pain with ROM- notified MD.   Exercises     Shoulder Instructions      Home Living Family/patient expects to be discharged to:: Private residence Living Arrangements: Spouse/significant other;Children Available Help at Discharge: Family;Available 24 hours/day Type of Home: House Home Access: Stairs to enter [employer] of Steps: 4 Entrance Stairs-Rails: Right;Left Home Layout: Two level;Able to live on main level with bedroom/bathroom Alternate Level Stairs-Number of Steps: flight of steps to 2nd floor bedroom Alternate Level Stairs-Rails: Right;Left Bathroom Shower/Tub: Walk-in shower;Door   [employer] Toilet: Standard Bathroom Accessibility: Yes   Home Equipment: Shower seat - built in;Hand held shower head   Additional Comments: built in shower seat in upstairs shower; no seated in downstairs shower. Spouse reports fall 1wk ago, pt seems to be denying fall.      Prior Functioning/Environment Prior Level of Function : Needs assist;History of Falls (last six months)             Mobility Comments: IND with ambulation; no falls. was going upstairs and doing yardwork ADLs Comments: IND with ADLs, wife does the driving, manages finances and med management. Steady decline    OT Problem List:  Decreased strength;Decreased coordination;Decreased cognition;Decreased safety awareness;Impaired balance (sitting and/or standing);Impaired vision/perception;Impaired UE functional use   OT Treatment/Interventions: Self-care/ADL training;Therapeutic exercise;Therapeutic activities;Patient/family education;Balance training;Cognitive remediation/compensation;Visual/perceptual remediation/compensation      OT Goals(Current goals can be found in  the care plan section)   Acute Rehab OT Goals Patient Stated Goal: To get better from here OT Goal Formulation: With patient/family Time For Goal Achievement: 01/02/24 Potential to Achieve Goals: Good ADL Goals Pt Will Perform Grooming: with modified independence;standing Pt Will Perform Upper Body Dressing: with set-up;sitting Pt Will Perform Lower Body Dressing: with set-up;sit to/from stand;with supervision Pt Will Transfer to Toilet: ambulating;with supervision Pt/caregiver will Perform Home Exercise Program: Increased ROM;Increased strength;Left upper extremity;With written HEP provided;Independently (and increased LUE coordination)   OT Frequency:  Min 2X/week    Co-evaluation PT/OT/SLP Co-Evaluation/Treatment: Yes Reason for Co-Treatment: Complexity of the patient's impairments (multi-system involvement) PT goals addressed during session: Mobility/safety with mobility;Balance;Strengthening/ROM OT goals addressed during session: ADL's and self-care;Strengthening/ROM      AM-PAC OT 6 Clicks Daily Activity     Outcome Measure Help from another person eating meals?: A Little Help from another person taking care of personal grooming?: A Little Help from another person toileting, which includes using toliet, bedpan, or urinal?: A Little Help from another person bathing (including washing, rinsing, drying)?: A Little Help from another person to put on and taking off regular upper body clothing?: A Lot Help from another person to put on and taking off regular lower body clothing?: A Lot 6 Click Score: 16   End of Session Equipment Utilized During Treatment: Gait belt Nurse Communication: Mobility status  Activity Tolerance: Patient tolerated treatment well Patient left: in bed;with call bell/phone within reach;with bed alarm set;with family/visitor present  OT Visit Diagnosis: Other abnormalities of gait and mobility (R26.89);Unsteadiness on feet (R26.81);Cognitive  communication deficit (R41.841);Muscle weakness (generalized) (M62.81) Symptoms and signs involving cognitive functions: Cerebral infarction                Time: 1411-1455 OT Time Calculation (min): 44 min Charges:  OT General Charges $OT Visit: 1 Visit OT Evaluation $OT Eval Moderate Complexity: 1 Mod  12/19/2023  AB, OTR/L  Acute Rehabilitation Services  Office: [phone]   Curtistine JONETTA Das 12/19/2023, 3:31 PM

## 2023-12-19 NOTE — Progress Notes (Signed)
 Routine EEG completed, results pending Neurology review and interpretation

## 2023-12-19 NOTE — Hospital Course (Signed)
 Alan Wise was admitted to the hospital with the working diagnosis of acute neurologic deficit, new cva with focal seizures.   53 y.o. male with medical history significant of T2DM, hyperlipidemia, GERD and low grade glioma who presented with acute focal neurologic deficit.  Recent hospitalization at [facility] 06/17 to 12/10/23 for altered mental status, work up was positive for anterior right temporal lobe punctate restricted diffusion per brain MRI. Neurology recommended not change in management.   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   2 days ago his wife found him on the floor, and helped him to stand back on his feet. The fall was not witnessed but apparently was severe. After the fall he was noted to have significant left sided weakness. He has been dragging his left leg while walking and needing constant help to keep his balance in order to prevent falling down.  On his initial physical examination his blood pressure was 143/82, RR 13 HR 63 and 02 saturation 100%  Neurology awake and alert, able to respond to simple question and follow simple commands. Mild confusion and not able to give detailed history, speech not slurred and cranial nerves 2 to 12 intact. Positive left upper and lower extremity proximal weakness, 4/5 left arm and 3/5 left leg.  Preserved hand grip.   Na 138, K 4,4 Cl 100, bicarbonate 29 glucose 105 bun 15 cr 1,14 AST 27 ALT 44  Wbc 6,9 hgb 16.2 plt 287  Alcohol < 15    CT head with stable encephalomalacia changes and dystrophic calcification within the right cerebral hemisphere, compatible with previously treated neoplasm. No adverse interval change.   Brain MRI with 3.2 x 1.3 focus of patchy restricted diffusion within the right basal ganglia, new from the recent prior brain MRI of 12/09/23. New punctate focus of restricted diffusion within the  right caudate head.  Small focus of restricted diffusion demonstrated within the right temporal lobe on prior MRI is no longer present.   06/30 patient has been working with PT and OT, possible transfer to inpatient rehab.    EKG 56 bpm, left axis deviation, left anterior fascicular block, qtc 413, sinus rhythm with no significant ST segment or T wave changes.   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 DOAC when improved balance and decrease fall risk.    06/29 patient tolerating well increase dose of lamotrigine , still not fully recovered balance. Plan for CIR consultation  07/01 patient today more somnolent and less reactive, confused and mildly agitated. Not eating well. Re consulted neurology, patient will get stat CT head and repeat EEG

## 2023-12-19 NOTE — Evaluation (Signed)
 Physical Therapy Evaluation Patient Details Name: Alan Wise MRN: [id number] DOB: 16-Feb-1971 Today's Date: 12/19/2023  History of Present Illness  53 y.o. male presents to [facility] 12/18/23 after falling two days prior with residual L sided weakness and somnolence. Brain MRI showed new patchy restricted diffusion in R basal ganglia, new punctuate focus of restricted diffusion in R caudate head. Recent admit 6/17-6/19 with anterior R temporal lobe punctate restricted diffusion. PMHx: T2DM, GERD, CVA w/ L sided weakness, oligodendroglioma   Clinical Impression  Pt in bed upon arrival with wife present and agreeable to PT eval. PTA, pt was independent with no AD for mobility. Wife reported recent fall with noted decline in function over the past two days. Pt had aphasia from prior CVA with wife reporting increased difficulty communicating. In today's session, pt required CGA to stand and ModA to ambulate due to two LOB when turning to the right. Pt presents with L>R LE weakness, impaired balance, impaired cognition, L UE weakness and coordination deficits. History of L visual field cut from cancer. Anticipate pt will progress quickly with >3hrs post acute rehab to maximize rehab potential. Acute PT to follow.         If plan is discharge home, recommend the following: Assistance with cooking/housework;Assist for transportation;Help with stairs or ramp for entrance;A lot of help with walking and/or transfers;A lot of help with bathing/dressing/bathroom;Direct supervision/assist for medications management;Direct supervision/assist for financial management   Can travel by private vehicle    Yes    Equipment Recommendations None recommended by PT  Recommendations for Other Services  Rehab consult    Functional Status Assessment Patient has had a recent decline in their functional status and demonstrates the ability to make significant improvements in function in a reasonable and predictable amount  of time.     Precautions / Restrictions Precautions Precautions: Fall Recall of Precautions/Restrictions: Impaired Precaution/Restrictions Comments: L visual field cut at baseline Restrictions Weight Bearing Restrictions Per Provider Order: No      Mobility  Bed Mobility Overal bed mobility: Needs Assistance Bed Mobility: Supine to Sit, Sit to Supine     Supine to sit: Contact guard, Used rails Sit to supine: Contact guard assist   General bed mobility comments: moved towards left side of the bed while gripping rail with R hand. Cues to scoot forwards with CGA for safety    Transfers Overall transfer level: Needs assistance Equipment used: None Transfers: Sit to/from Stand Sit to Stand: +2 safety/equipment, Contact guard assist      General transfer comment: CGA for STS with x2 for safety    Ambulation/Gait Ambulation/Gait assistance: Min assist, Mod assist Gait Distance (Feet): 80 Feet (x80, x120) Assistive device: None Gait Pattern/deviations: Step-through pattern, Decreased stride length, Shuffle, Narrow base of support, Trunk flexed, Drifts right/left       General Gait Details: narrow BOS with pt drifting right/left, ModA requiring to correct balance when turning to the right. Cues to lengthen stride with little follow through  Stairs Stairs: Yes Stairs assistance: Min assist Stair Management: One rail Right, Step to pattern, Forwards, Alternating pattern Number of Stairs: 4 General stair comments: slightly unsteady with use of 1HR. Able to grip with both left and right hand. MinA for balance  Modified Rankin (Stroke Patients Only) Modified Rankin (Stroke Patients Only) Pre-Morbid Rankin Score: Moderate disability Modified Rankin: Moderately severe disability     Balance Overall balance assessment: Needs assistance, History of Falls, Mild deficits observed, not formally tested Sitting-balance support: Feet supported  Sitting balance-Leahy Scale: Fair      Standing balance support: During functional activity, Single extremity supported Standing balance-Leahy Scale: Fair Standing balance comment: 2 LOBs to the R.       Pertinent Vitals/Pain Pain Assessment Pain Assessment: Faces Faces Pain Scale: Hurts a little bit Pain Location: L shoulder with mobility Pain Descriptors / Indicators: Discomfort Pain Intervention(s): Limited activity within patient's tolerance, Monitored during session, Repositioned    Home Living Family/patient expects to be discharged to:: Private residence Living Arrangements: Spouse/significant other;Children Available Help at Discharge: Family;Available 24 hours/day Type of Home: House Home Access: Stairs to enter Entrance Stairs-Rails: Right;Left Entrance Stairs-Number of Steps: 4 Alternate Level Stairs-Number of Steps: flight of steps to 2nd floor bedroom Home Layout: Two level;Able to live on main level with bedroom/bathroom Home Equipment: Shower seat - built in;Hand held shower head Additional Comments: built in shower seat in upstairs shower; no seated in downstairs shower. Spouse reports fall 1wk ago, pt seems to be denying fall.    Prior Function Prior Level of Function : Needs assist;History of Falls (last six months)    Mobility Comments: IND with ambulation; no falls. was going upstairs and doing yardwork ADLs Comments: IND with ADLs, wife does the driving, manages finances and med management. Steady decline     Extremity/Trunk Assessment   Upper Extremity Assessment Upper Extremity Assessment: Defer to OT evaluation LUE Deficits / Details: Pt reports change in function, LUE only able to touch side of mouth (R), shoulder flexion ~ 70 deg AROM prior to onset of pain but has more with AAROM/PROM (still pain limiting), shoulder flexion MMT 3/5, weak grip compared to R, elbow flexion/ext 4+/5 MMT, 1/4 MAS slight elbow flexor tone. LUE: Shoulder pain with ROM LUE Sensation: WNL LUE Coordination:  decreased fine motor;decreased gross motor    Lower Extremity Assessment Lower Extremity Assessment: RLE deficits/detail;LLE deficits/detail RLE Deficits / Details: Hip flexion 4+/5, knee ext 5/5, ankle DF 4+/5 RLE Sensation: WNL RLE Coordination: WNL LLE Deficits / Details: Hip flexion 4/5, knee ext 4/5, limited ankle DF ROM with 4/5 for MMT LLE Sensation: WNL LLE Coordination: WNL    Cervical / Trunk Assessment Cervical / Trunk Assessment: Normal  Communication   Communication Communication: Impaired Factors Affecting Communication: Difficulty expressing self;Reduced clarity of speech    Cognition Arousal: Alert Behavior During Therapy: WFL for tasks assessed/performed   PT - Cognitive impairments: Memory, Attention, Sequencing, Problem solving, Orientation   Orientation impairments: Time    PT - Cognition Comments: Does well with yes/no questions and can answer some orientation questions with answer choices given. Multiple attempts to follow commands with tactile and visual cues necessary Following commands: Intact       Cueing Cueing Techniques: Verbal cues, Tactile cues, Visual cues     General Comments General comments (skin integrity, edema, etc.): Spouse present and supportive during session     PT Assessment Patient needs continued PT services  PT Problem List Decreased strength;Decreased activity tolerance;Decreased balance;Decreased mobility;Decreased range of motion;Decreased coordination;Decreased cognition;Decreased safety awareness       PT Treatment Interventions DME instruction;Balance training;Gait training;Neuromuscular re-education;Stair training;Functional mobility training;Patient/family education;Therapeutic activities;Therapeutic exercise    PT Goals (Current goals can be found in the Care Plan section)  Acute Rehab PT Goals Patient Stated Goal: to get more rehab PT Goal Formulation: With patient/family Time For Goal Achievement:  01/02/24 Potential to Achieve Goals: Good    Frequency Min 3X/week     Co-evaluation   Reason for Co-Treatment: Complexity  of the patient's impairments (multi-system involvement) PT goals addressed during session: Mobility/safety with mobility;Balance;Strengthening/ROM OT goals addressed during session: ADL's and self-care;Strengthening/ROM       AM-PAC PT 6 Clicks Mobility  Outcome Measure Help needed turning from your back to your side while in a flat bed without using bedrails?: A Little Help needed moving from lying on your back to sitting on the side of a flat bed without using bedrails?: A Little Help needed moving to and from a bed to a chair (including a wheelchair)?: A Little Help needed standing up from a chair using your arms (e.g., wheelchair or bedside chair)?: A Little Help needed to walk in hospital room?: A Lot Help needed climbing 3-5 steps with a railing? : A Little 6 Click Score: 17    End of Session Equipment Utilized During Treatment: Gait belt Activity Tolerance: Patient tolerated treatment well Patient left: in bed;with call bell/phone within reach;with family/visitor present Nurse Communication: Mobility status PT Visit Diagnosis: Unsteadiness on feet (R26.81);Other abnormalities of gait and mobility (R26.89);Muscle weakness (generalized) (M62.81)    Time: 8589-8548 PT Time Calculation (min) (ACUTE ONLY): 41 min   Charges:   PT Evaluation $PT Eval Moderate Complexity: 1 Mod PT Treatments $Therapeutic Activity: 8-22 mins PT General Charges $$ ACUTE PT VISIT: 1 Visit       Kate ORN, PT, DPT Secure Chat Preferred  Rehab Office [phone]   Kate BRAVO Wendolyn 12/19/2023, 4:44 PM

## 2023-12-19 NOTE — Plan of Care (Signed)
  Problem: Ischemic Stroke/TIA Tissue Perfusion: Goal: Complications of ischemic stroke/TIA will be minimized Outcome: Progressing   Problem: Coping: Goal: Will verbalize positive feelings about self Outcome: Progressing   Problem: Health Behavior/Discharge Planning: Goal: Ability to manage health-related needs will improve Outcome: Progressing   Problem: Self-Care: Goal: Ability to communicate needs accurately will improve Outcome: Progressing   Problem: Activity: Goal: Risk for activity intolerance will decrease Outcome: Progressing   Problem: Safety: Goal: Ability to remain free from injury will improve Outcome: Progressing   Problem: Skin Integrity: Goal: Risk for impaired skin integrity will decrease Outcome: Progressing

## 2023-12-19 NOTE — Progress Notes (Addendum)
 Progress Note   Patient: Alan Wise FMW:[id number] DOB: 18-Jul-1970 DOA: 12/18/2023     1 DOS: the patient was seen and examined on 12/19/2023   Brief hospital course: [name] was admitted to the hospital with the working diagnosis of acute neurologic deficit, new cva with focal seizures.   53 y.o. male with medical history significant of T2DM, hyperlipidemia, GERD and low grade glioma who presented with acute focal neurologic deficit.  Recent hospitalization at [facility] 06/17 to 12/10/23 for altered mental status, work up was positive for anterior right temporal lobe punctate restricted diffusion per brain MRI. Neurology recommended not change in management.    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   2 days ago his wife found him on the floor, and helped him to stand back on his feet. The fall was not witnessed but apparently was severe. After the fall he was noted to have significant left sided weakness. He has been dragging his left leg while walking and needing constant help to keep his balance in order to prevent falling down.  On his initial physical examination his blood pressure was 143/82, RR 13 HR 63 and 02 saturation 100%  Neurology awake and alert, able to respond to simple question and follow simple commands. Mild confusion and not able to give detailed history, speech not slurred and cranial nerves 2 to 12 intact. Positive left upper and lower extremity proximal weakness, 4/5 left arm and 3/5 left leg.  Preserved hand grip.   Na 138, K 4,4 Cl 100, bicarbonate 29 glucose 105 bun 15 cr 1,14 AST 27 ALT 44  Wbc 6,9 hgb 16.2 plt 287  Alcohol < 15    CT head with stable encephalomalacia changes and dystrophic calcification within the right cerebral hemisphere, compatible with previously treated neoplasm. No adverse interval change.   Brain MRI with 3.2  x 1.3 focus of patchy restricted diffusion within the right basal ganglia, new from the recent prior brain MRI of 12/09/23. New punctate focus of restricted diffusion within the right caudate head.  Small focus of restricted diffusion demonstrated within the right temporal lobe on prior MRI is no longer present.    EKG 56 bpm, left axis deviation, left anterior fascicular block, qtc 413, sinus rhythm with no significant ST segment or T wave changes.     Assessment and Plan: Acute CVA (cerebrovascular accident) (HCC) Recurrent acute CVA.   Plan to continue neuro checks q 4 hrs Blood pressure control with amlodipine .  For now will continue on aspirin  and will resume apixaban  when patient improves his balance, per request of his wife. She is worried about falls on anticoagulation  Continue with  statin therapy   Follow up with Pt and Ot  Checking left shoulder radiograph, positive pain with movement.   Seizure disorder (HCC) Possible focal seizures. Case discussed with neurology  Continue lamotrigine  at increased dose and monitor response.    EEG with cortical dysfunction arising from right hemisphere, maximal right fronto temporal region likely secondary to underlying structural abnormality. No seizures or definite epileptiform discharges.   Essential hypertension Continue blood pressure control with amlodipine  and propranolol    Type 2 diabetes mellitus with hyperlipidemia (HCC) Admission glucose is not elevated, will continue to check CBG as needed.  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       Subjective: patient feels better, but still having difficulty ambulating.Alan Wise   Physical Exam: Vitals:   12/18/23 2351 12/19/23 0407 12/19/23 0737 12/19/23 1149  BP: 129/80 116/74 (!) 140/83 (!) 143/101  Pulse: (!) 52 (!) 51 (!) 54 62  Resp: 18 15 17    Temp: 97.6 F  (36.4 C) (!) 97.3 F (36.3 C) 98.3 F (36.8 C) 97.6 F (36.4 C)  TempSrc: Oral Oral Oral Oral  SpO2: 96% 95% 98% 98%  Weight:      Height:       Neurology awake and alert, speech is slow, respond to question and follows commands, possible confusion but not agitation ENT with mild pallor with no icterus Cardiovascular with S1 and S2 present and regular with no gallops, rubs or murmurs Respiratory with no rales or wheezing, no rhonchi  Abdomen with no distention  No lower extremity edema   Data Reviewed:    Family Communication: I spoke with patient's wife at the bedside, we talked in detail about patient's condition, plan of care and prognosis and all questions were addressed.   Disposition: Status is: Inpatient Remains inpatient appropriate because: neuro monitoring   Planned Discharge Destination: Home    Author: Elidia Toribio Furnace, MD 12/19/2023 2:44 PM  For on call review [email or web address].

## 2023-12-20 DIAGNOSIS — I10 Essential (primary) hypertension: Secondary | ICD-10-CM | POA: Diagnosis not present

## 2023-12-20 DIAGNOSIS — G40909 Epilepsy, unspecified, not intractable, without status epilepticus: Secondary | ICD-10-CM | POA: Diagnosis not present

## 2023-12-20 DIAGNOSIS — I639 Cerebral infarction, unspecified: Secondary | ICD-10-CM | POA: Diagnosis not present

## 2023-12-20 DIAGNOSIS — E1169 Type 2 diabetes mellitus with other specified complication: Secondary | ICD-10-CM | POA: Diagnosis not present

## 2023-12-20 NOTE — Plan of Care (Signed)
  Problem: Nutrition: Goal: Risk of aspiration will decrease Outcome: Progressing Goal: Dietary intake will improve Outcome: Progressing   Problem: Clinical Measurements: Goal: Respiratory complications will improve Outcome: Progressing

## 2023-12-20 NOTE — Plan of Care (Signed)
  Problem: Education: Goal: Knowledge of disease or condition will improve Outcome: Progressing   Problem: Ischemic Stroke/TIA Tissue Perfusion: Goal: Complications of ischemic stroke/TIA will be minimized Outcome: Progressing   Problem: Coping: Goal: Will verbalize positive feelings about self Outcome: Progressing   Problem: Health Behavior/Discharge Planning: Goal: Ability to manage health-related needs will improve Outcome: Progressing   Problem: Self-Care: Goal: Ability to participate in self-care as condition permits will improve Outcome: Progressing   Problem: Self-Care: Goal: Ability to communicate needs accurately will improve Outcome: Progressing   Problem: Nutrition: Goal: Risk of aspiration will decrease Outcome: Progressing   Problem: Clinical Measurements: Goal: Will remain free from infection Outcome: Progressing   Problem: Activity: Goal: Risk for activity intolerance will decrease Outcome: Progressing   Problem: Coping: Goal: Level of anxiety will decrease Outcome: Progressing   Problem: Safety: Goal: Ability to remain free from injury will improve Outcome: Progressing   Problem: Skin Integrity: Goal: Risk for impaired skin integrity will decrease Outcome: Progressing

## 2023-12-20 NOTE — PMR Pre-admission (Signed)
 PMR Admission Coordinator Pre-Admission Assessment  Patient: Alan Wise is an 53 y.o., male MRN: [id number] DOB: 14-Nov-1970 Height: 5' 8 (172.7 cm) Weight: 63.5 kg  Insurance Information HMO: ***    PPO: ***     PCP:      IPA:      80/20:      OTHER:  PRIMARY: [employer]     Policy#: [id number]      Subscriber: patient CM Name: ***      Phone#: ***     Fax#: [phone] Pre-Cert#: [id number]      Employer:  Benefits:  Phone #: [phone]     Name:  Eff. Date: 06/24/23-still active     Deduct: $1,250 ($1,250 met)      Out of Pocket Max: $4,890 ($4,890 met)      Life Max: NA CIR: $300 co-pay/admission, then 80% coverage, 20% co-insurance      SNF: 80% coverage, 20% co-insurance Outpatient: $0-$52 co-pay/visit     Co-Pay:  Home Health: 80% coverage      Co-Pay: 20% co-insurance DME: 80% coverage     Co-Pay: 20% co-insurance Providers: in-network SECONDARY:       Policy#:      Phone#:   Artist:       Phone#:   The Data processing manager" for patients in Inpatient Rehabilitation Facilities with attached "Privacy Act Statement-Health Care Records" was provided and verbally reviewed with: {CHL IP Patient Family WJ:[id number]}  Emergency Contact Information Contact Information     Name Relation Home Work Mobile   Winkleman,[name] Spouse   [phone]   Hogle,[name] Mother [phone]  [phone]      Other Contacts   None on File     Current Medical History  Patient Admitting Diagnosis: CVA History of Present Illness: Pt is a 53 year old male with medical hx significant for: CVA with residual left-sided deficits, oligodendroglioma s/p radiation, DM II, hyperlipidemia, GERD. Pt [facility]23 d/t focal weakness x 3 days prior. Pt dragging left leg. Pt had a fall 2 days prior to presentation. Recently hospitalized from 6/17-6/19 d/t anterior right temporal lobe punctate restricted diffusion. 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 Buckley. MRI revealed patchy restricted diffusion within right basal ganglia. New punctate focus of restricted diffusion within right caudate head. Neurology consulted. EEG on 6/28 was suggestive of cortical dysfunction arising from right hemisphere. No seizures seen. *** Therapy evaluations completed and CIR recommended d/t pt's deficits in functional mobility.  Complete NIHSS TOTAL: 8  Patient's medical record from [facility] has been reviewed by the rehabilitation admission coordinator and physician.  Past Medical History  Past Medical History:  Diagnosis Date   Allergy    Complication of anesthesia    pt reports he is starting to have more difficulty arousing after surgery   Diabetes mellitus (HCC)    GERD (gastroesophageal reflux disease)    Headache    History of kidney stones    Hyperlipidemia    Renal disorder    kidney stones    Has the patient had major surgery during 100 days prior to admission? No  Family History   family history includes Benign prostatic hyperplasia in his father; Diabetes in his father and maternal grandmother; Hyperlipidemia in his father; Hypertension in his father and paternal grandmother; Stroke in his maternal grandmother and paternal grandmother.  Current Medications  Current Facility-Administered Medications:    acetaminophen  (TYLENOL ) tablet 650 mg, 650 mg, Oral, Q6H PRN **OR** acetaminophen  (TYLENOL ) suppository 650 mg, 650 mg, Rectal, Q6H PRN, Arrien, Elidia Sieving, MD   amLODipine  (NORVASC ) tablet 5 mg, 5 mg, Oral, Daily, Arrien, Mauricio Daniel, MD, 5 mg at 12/21/23 9051   aspirin  EC tablet 81 mg, 81 mg, Oral, Daily, Arrien, Mauricio Daniel, MD, 81 mg at 12/21/23 9051   atorvastatin  (LIPITOR) tablet 40 mg, 40 mg, Oral, Daily, Arrien, Mauricio Daniel, MD, 40 mg at 12/21/23 9050   lamoTRIgine  (LAMICTAL ) tablet 150  mg, 150 mg, Oral, Daily, 150 mg at 12/21/23 0948 **AND** lamoTRIgine  (LAMICTAL ) tablet 200 mg, 200 mg, Oral, QHS, Bhagat, Srishti L, MD, 200 mg at 12/20/23 2124   ondansetron  (ZOFRAN ) tablet 4 mg, 4 mg, Oral, Q6H PRN **OR** ondansetron  (ZOFRAN ) injection 4 mg, 4 mg, Intravenous, Q6H PRN, Arrien, Elidia Sieving, MD   propranolol  (INDERAL ) tablet 20 mg, 20 mg, Oral, BID, Arrien, Mauricio Daniel, MD, 20 mg at 12/21/23 9051  Patients Current Diet:  Diet Order             Diet regular Fluid consistency: Thin  Diet effective now                   Precautions / Restrictions Precautions Precautions: Fall Precaution/Restrictions Comments: L visual field cut at baseline Restrictions Weight Bearing Restrictions Per Provider Order: No   Has the patient had 2 or more falls or a fall with injury in the past year? Yes  Prior Activity Level Community (5-7x/wk): therapy 3 days/week  Prior Functional Level Self Care: Did the patient need help bathing, dressing, using the toilet or eating? Independent  Indoor Mobility: Did the patient need assistance with walking from room to room (with or without device)? Independent  Stairs: Did the patient need assistance with internal or external stairs (with or without device)? Independent  Functional Cognition: Did the patient need help planning regular tasks such as shopping or remembering to take medications? Needed some help  Patient Information Are you of Hispanic, Latino/a,or Spanish origin?: X. Patient unable to respond, A. No, not of Hispanic, Latino/a, or Spanish origin What is your race?: X. Patient unable to respond, A. White Do you need or want an interpreter to communicate with a doctor or health care staff?: 9. Unable to respond  Patient's Response To:  Health Literacy and Transportation Is the patient able to respond to health literacy and transportation needs?: No Health Literacy - How often do you need to have someone help you when  you read instructions, pamphlets, or other written material from your doctor or pharmacy?: Patient unable to respond  Home Assistive Devices / Equipment Home Equipment: Shower seat - built in, Hand held shower head  Prior Device Use: Indicate devices/aids used by the patient prior to current illness, exacerbation or injury? None of the above  Current Functional Level Cognition  Orientation Level: Oriented to person    Extremity Assessment (includes Sensation/Coordination)  Upper Extremity Assessment: Defer to OT evaluation LUE Deficits / Details: Pt reports change in function, LUE only able to touch side of mouth (R), shoulder flexion ~ 70 deg AROM prior to onset of pain but has more with AAROM/PROM (still pain limiting), shoulder flexion MMT 3/5, weak grip compared to R, elbow flexion/ext 4+/5 MMT, 1/4 MAS slight elbow flexor tone. LUE: Shoulder pain with ROM LUE Sensation: WNL LUE Coordination: decreased fine motor, decreased gross motor  Lower Extremity Assessment: RLE deficits/detail, LLE  deficits/detail RLE Deficits / Details: Hip flexion 4+/5, knee ext 5/5, ankle DF 4+/5 RLE Sensation: WNL RLE Coordination: WNL LLE Deficits / Details: Hip flexion 4/5, knee ext 4/5, limited ankle DF ROM with 4/5 for MMT LLE Sensation: WNL LLE Coordination: WNL    ADLs  Overall ADL's : Needs assistance/impaired Eating/Feeding: Set up, Sitting Grooming: Standing, Contact guard assist, Wash/dry face, Oral care Upper Body Bathing: Sitting, Set up Lower Body Bathing: Sitting/lateral leans, Set up Upper Body Dressing : Sitting, Moderate assistance Lower Body Dressing: Sitting/lateral leans, Moderate assistance Toilet Transfer: Minimal assistance, Ambulation Toilet Transfer Details (indicate cue type and reason): min A to mod A Toileting- Clothing Manipulation and Hygiene: Sit to/from stand, Minimal assistance Functional mobility during ADLs: Minimal assistance (to mod A) General ADL Comments: Pt  with 2 LOBs to the R needing mod A to correct    Mobility  Overal bed mobility: Needs Assistance Bed Mobility: Supine to Sit, Sit to Supine Supine to sit: Contact guard, Used rails Sit to supine: Contact guard assist General bed mobility comments: moved towards left side of the bed while gripping rail with R hand. Cues to scoot forwards with CGA for safety    Transfers  Overall transfer level: Needs assistance Equipment used: None Transfers: Sit to/from Stand Sit to Stand: +2 safety/equipment, Contact guard assist General transfer comment: CGA for STS with x2 for safety    Ambulation / Gait / Stairs / Wheelchair Mobility  Ambulation/Gait Ambulation/Gait assistance: Min assist, Mod assist Gait Distance (Feet): 80 Feet (x80, x120) Assistive device: None Gait Pattern/deviations: Step-through pattern, Decreased stride length, Shuffle, Narrow base of support, Trunk flexed, Drifts right/left General Gait Details: narrow BOS with pt drifting right/left, ModA requiring to correct balance when turning to the right. Cues to lengthen stride with little follow through Stairs: Yes Stairs assistance: Min assist Stair Management: One rail Right, Step to pattern, Forwards, Alternating pattern Number of Stairs: 4 General stair comments: slightly unsteady with use of 1HR. Able to grip with both left and right hand. MinA for balance    Posture / Balance Balance Overall balance assessment: Needs assistance, History of Falls, Mild deficits observed, not formally tested Sitting-balance support: Feet supported Sitting balance-Leahy Scale: Fair Standing balance support: During functional activity, Single extremity supported Standing balance-Leahy Scale: Fair Standing balance comment: 2 LOBs to the R.    Special needs/care consideration Skin Erythema/Redness: coccyx/bilateral   Previous Home Environment (from acute therapy documentation) Living Arrangements: Spouse/significant other Available Help at  Discharge: Family, Available 24 hours/day Type of Home: House Home Layout: Two level, Able to live on main level with bedroom/bathroom Alternate Level Stairs-Rails: Left, Right Alternate Level Stairs-Number of Steps: 5 steps then 10 steps Home Access: Stairs to enter Entrance Stairs-Rails: Right, Left Entrance Stairs-Number of Steps: 4 Bathroom Shower/Tub: Psychologist, counselling, Door [employer] Toilet: Standard Bathroom Accessibility: Yes How Accessible: Accessible via walker Additional Comments: built in shower seat in upstairs shower; no seated in downstairs shower. Spouse reports fall 1wk ago, pt seems to be denying fall.  Discharge Living Setting Plans for Discharge Living Setting: Patient's home Type of Home at Discharge: House Discharge Home Layout: Able to live on main level with bedroom/bathroom, Two level Alternate Level Stairs-Rails: Left, Right Alternate Level Stairs-Number of Steps: 5 steps then 10 steps Discharge Home Access: Stairs to enter Entrance Stairs-Rails: Right, Left Entrance Stairs-Number of Steps: 4 Discharge Bathroom Shower/Tub: Walk-in shower Discharge Bathroom Toilet: Standard Discharge Bathroom Accessibility: Yes How Accessible: Accessible via walker Does the patient  have any problems obtaining your medications?: No  Social/Family/Support Systems Anticipated Caregiver: Santhosh Gulino, wife Anticipated Caregiver's Contact Information: [phone] Caregiver Availability: 24/7 Discharge Plan Discussed with Primary Caregiver: Yes Is Caregiver In Agreement with Plan?: Yes Does Caregiver/Family have Issues with Lodging/Transportation while Pt is in Rehab?: No  Goals Patient/Family Goal for Rehab: Supervision: PT/OT, Min-Mod A: ST Expected length of stay: 7-10 days Pt/Family Agrees to Admission and willing to participate: Yes Program Orientation Provided & Reviewed with Pt/Caregiver Including Roles  & Responsibilities: Yes  Decrease burden of Care through IP  rehab admission: NA  Possible need for SNF placement upon discharge: Not anticipated  Patient Condition: I have reviewed medical records from [facility], spoken with CM, and patient, spouse, and family member. I met with patient at the bedside and discussed via phone for inpatient rehabilitation assessment.  Patient will benefit from ongoing PT, OT, and SLP, can actively participate in 3 hours of therapy a day 5 days of the week, and can make measurable gains during the admission.  Patient will also benefit from the coordinated team approach during an Inpatient Acute Rehabilitation admission.  The patient will receive intensive therapy as well as Rehabilitation physician, nursing, social worker, and care management interventions.  Due to safety, skin/wound care, disease management, medication administration, pain management, and patient education the patient requires 24 hour a day rehabilitation nursing.  The patient is currently *** with mobility and basic ADLs.  Discharge setting and therapy post discharge at home with home health is anticipated.  Patient has agreed to participate in the Acute Inpatient Rehabilitation Program and will admit {Time; today/tomorrow:10263}.  Preadmission Screen Completed By:  Tinnie SHAUNNA Yvone Delayne, 12/21/2023 10:44 AM ______________________________________________________________________   Discussed status with Dr. PIERRETTE on *** at *** and received approval for admission today.  Admission Coordinator:  Tinnie SHAUNNA Yvone Delayne, CCC-SLP, time ***/Date ***   Assessment/Plan: Diagnosis: *** Does the need for close, 24 hr/day Medical supervision in concert with the patient's rehab needs make it unreasonable for this patient to be served in a less intensive setting? {yes_no_potentially:3041433} Co-Morbidities requiring supervision/potential complications: *** Due to {due un:6958565}, does the patient require 24 hr/day rehab nursing? {yes_no_potentially:3041433} Does  the patient require coordinated care of a physician, rehab nurse, PT, OT, and SLP to address physical and functional deficits in the context of the above medical diagnosis(es)? {yes_no_potentially:3041433} Addressing deficits in the following areas: {deficits:3041436} Can the patient actively participate in an intensive therapy program of at least 3 hrs of therapy 5 days a week? {yes_no_potentially:3041433} The potential for patient to make measurable gains while on inpatient rehab is {potential:3041437} Anticipated functional outcomes upon discharge from inpatient rehab: {functional outcomes:304600100} PT, {functional outcomes:304600100} OT, {functional outcomes:304600100} SLP Estimated rehab length of stay to reach the above functional goals is: *** Anticipated discharge destination: {anticipated dc setting:21604} 10. Overall Rehab/Functional Prognosis: {potential:3041437}   MD Signature: ***

## 2023-12-20 NOTE — Progress Notes (Signed)
 Inpatient Rehab Admissions:  Inpatient Rehab Consult received.  I met with patient and mother at the bedside for rehabilitation assessment and to discuss goals and expectations of an inpatient rehab admission.  Discussed average length of stay, insurance authorization requirement and discharge home after completion of CIR. Pt's mother acknowledged understanding. Due to pt's difficulty communicating d/t aphasia, AC contacted pt's wife. Spoke with pt's wife Tinnie on the telephone. Reviewed CIR goals and expectations. She acknowledged  understanding. She is supportive of pt pursuing CIR. She confirmed that she will be able to provide 24/7 support for pt after discharge. Will continue to follow.  Signed: Tinnie Yvone Cohens, MS, CCC-SLP Admissions Coordinator [phone]

## 2023-12-20 NOTE — Progress Notes (Signed)
 Progress Note   Patient: Alan Wise FMW:[id number] DOB: 1970/10/07 DOA: 12/18/2023     2 DOS: the patient was seen and examined on 12/20/2023   Brief hospital course: Alan Wise was admitted to the hospital with the working diagnosis of acute neurologic deficit, new cva with focal seizures.   53 y.o. male with medical history significant of T2DM, hyperlipidemia, GERD and low grade glioma who presented with acute focal neurologic deficit.  Recent hospitalization at [facility] 06/17 to 12/10/23 for altered mental status, work up was positive for anterior right temporal lobe punctate restricted diffusion per brain MRI. Neurology recommended not change in management.    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   2 days ago his wife found him on the floor, and helped him to stand back on his feet. The fall was not witnessed but apparently was severe. After the fall he was noted to have significant left sided weakness. He has been dragging his left leg while walking and needing constant help to keep his balance in order to prevent falling down.  On his initial physical examination his blood pressure was 143/82, RR 13 HR 63 and 02 saturation 100%  Neurology awake and alert, able to respond to simple question and follow simple commands. Mild confusion and not able to give detailed history, speech not slurred and cranial nerves 2 to 12 intact. Positive left upper and lower extremity proximal weakness, 4/5 left arm and 3/5 left leg.  Preserved hand grip.   Na 138, K 4,4 Cl 100, bicarbonate 29 glucose 105 bun 15 cr 1,14 AST 27 ALT 44  Wbc 6,9 hgb 16.2 plt 287  Alcohol < 15    CT head with stable encephalomalacia changes and dystrophic calcification within the right cerebral hemisphere, compatible with previously treated neoplasm. No adverse interval change.   Brain MRI with 3.2  x 1.3 focus of patchy restricted diffusion within the right basal ganglia, new from the recent prior brain MRI of 12/09/23. New punctate focus of restricted diffusion within the right caudate head.  Small focus of restricted diffusion demonstrated within the right temporal lobe on prior MRI is no longer present.    EKG 56 bpm, left axis deviation, left anterior fascicular block, qtc 413, sinus rhythm with no significant ST segment or T wave changes.   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 DOAC when improved balance and decrease fall risk.    06/29 patient tolerating well increase dose of lamotrigine , still not fully recovered balance. Plan for CIR consultation   Assessment and Plan: Acute CVA (cerebrovascular accident) (HCC) Recurrent acute CVA.   Plan to continue neuro checks q 4 hrs Blood pressure control with amlodipine .  For now will continue on aspirin  and will resume apixaban  when patient improves his balance, per request of his wife.  Continue with  statin therapy   Follow up with Pt and Ot, may qualify for CIR.  Left shoulder radiograph with no fracture or dislocation.   Seizure disorder (HCC) Possible focal seizures.  Patient is tolerating well change dose of lamotrigine , 150 mg am and 200 mg pm.   EEG with cortical dysfunction arising from right hemisphere, maximal right fronto temporal region likely secondary to underlying structural abnormality. No seizures or definite epileptiform discharges.   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       Subjective: patient is anxious to go home, had no somnolence or seizures. His balance still not at baseline, per physical /  occupational therapy   Physical Exam: Vitals:   12/19/23 2319 12/20/23 0400 12/20/23 0815 12/20/23 1111  BP: 130/75 111/76 127/89 110/87  Pulse: 63 (!) 57 66 (!) 59  Resp: 18 18 16    Temp: (!) 97.5 F (36.4 C) (!) 97.4 F (36.3 C) 98 F (36.7 C) 98.2 F (36.8 C)  TempSrc: Oral Oral Oral Oral  SpO2: 95% 96% 95% 95%  Weight:      Height:       Neurology awake and alert, speech slow and not fluid, possible confusion, but apparently at his baseline mentation, his balance and mobility not yet back to baseline  ENT With mild pallor Cardiovascular with S1 and S2 present and regular with no murmurs Respiratory with no rales or wheezing Abdomen with no distention  No lower extremity edema   Data Reviewed:    Family Communication: I spoke with patient's mother at the bedside, we talked in detail about patient's condition, plan of care and prognosis and all questions were addressed.   Disposition: Status is: Inpatient Remains inpatient appropriate because: pending inpatient rehabilitation evaluation   Planned Discharge Destination: to be determined     Author: Elidia Toribio Furnace, MD 12/20/2023 11:24 AM  For on call review [email or web address].

## 2023-12-21 ENCOUNTER — Ambulatory Visit

## 2023-12-21 ENCOUNTER — Encounter: Payer: Self-pay | Admitting: Occupational Therapy

## 2023-12-21 ENCOUNTER — Encounter: Payer: Self-pay | Admitting: Speech Pathology

## 2023-12-21 DIAGNOSIS — E1169 Type 2 diabetes mellitus with other specified complication: Secondary | ICD-10-CM | POA: Diagnosis not present

## 2023-12-21 DIAGNOSIS — I10 Essential (primary) hypertension: Secondary | ICD-10-CM | POA: Diagnosis not present

## 2023-12-21 DIAGNOSIS — I639 Cerebral infarction, unspecified: Secondary | ICD-10-CM | POA: Diagnosis not present

## 2023-12-21 DIAGNOSIS — I63521 Cerebral infarction due to unspecified occlusion or stenosis of right anterior cerebral artery: Secondary | ICD-10-CM | POA: Diagnosis not present

## 2023-12-21 DIAGNOSIS — G40909 Epilepsy, unspecified, not intractable, without status epilepticus: Secondary | ICD-10-CM | POA: Diagnosis not present

## 2023-12-21 MED ORDER — POLYETHYLENE GLYCOL 3350 17 G PO PACK: 17.0000 g | PACK | Freq: Every day | ORAL | Status: DC | PRN

## 2023-12-21 NOTE — Progress Notes (Signed)
 Progress Note   Patient: Alan Wise FMW:[id number] DOB: 03/21/1971 DOA: 12/18/2023     3 DOS: the patient was seen and examined on 12/21/2023   Brief hospital course: Alan Wise was admitted to the hospital with the working diagnosis of acute neurologic deficit, new cva with focal seizures.   53 y.o. male with medical history significant of T2DM, hyperlipidemia, GERD and low grade glioma who presented with acute focal neurologic deficit.  Recent hospitalization at [facility] 06/17 to 12/10/23 for altered mental status, work up was positive for anterior right temporal lobe punctate restricted diffusion per brain MRI. Neurology recommended not change in management.    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   2 days ago his wife found him on the floor, and helped him to stand back on his feet. The fall was not witnessed but apparently was severe. After the fall he was noted to have significant left sided weakness. He has been dragging his left leg while walking and needing constant help to keep his balance in order to prevent falling down.  On his initial physical examination his blood pressure was 143/82, RR 13 HR 63 and 02 saturation 100%  Neurology awake and alert, able to respond to simple question and follow simple commands. Mild confusion and not able to give detailed history, speech not slurred and cranial nerves 2 to 12 intact. Positive left upper and lower extremity proximal weakness, 4/5 left arm and 3/5 left leg.  Preserved hand grip.   Na 138, K 4,4 Cl 100, bicarbonate 29 glucose 105 bun 15 cr 1,14 AST 27 ALT 44  Wbc 6,9 hgb 16.2 plt 287  Alcohol < 15    CT head with stable encephalomalacia changes and dystrophic calcification within the right cerebral hemisphere, compatible with previously treated neoplasm. No adverse interval change.   Brain MRI with 3.2  x 1.3 focus of patchy restricted diffusion within the right basal ganglia, new from the recent prior brain MRI of 12/09/23. New punctate focus of restricted diffusion within the right caudate head.  Small focus of restricted diffusion demonstrated within the right temporal lobe on prior MRI is no longer present.    EKG 56 bpm, left axis deviation, left anterior fascicular block, qtc 413, sinus rhythm with no significant ST segment or T wave changes.   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 DOAC when improved balance and decrease fall risk.    06/29 patient tolerating well increase dose of lamotrigine , still not fully recovered balance. Plan for CIR consultation   Assessment and Plan: Acute CVA (cerebrovascular accident) (HCC) Recurrent acute CVA.   Plan to continue neuro checks q 4 hrs Blood pressure control with amlodipine .  For now will continue on aspirin  and will resume apixaban  when patient improves his balance, per request of his wife.  Continue with  statin therapy   Follow up with Pt and Ot, may qualify for CIR.  Left shoulder radiograph with no fracture or dislocation.   Seizure disorder (HCC) Possible focal seizures.  Patient is tolerating well change dose of lamotrigine , 150 mg am and 200 mg pm.   EEG with cortical dysfunction arising from right hemisphere, maximal right fronto temporal region likely secondary to underlying structural abnormality. No seizures or definite epileptiform discharges.   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       Subjective: improved mobility with physical therapy and occupational therapy, no chest pain, no dyspnea, no somnolence or  seizures   Physical Exam: Vitals:   12/21/23 0008 12/21/23 0355 12/21/23 0821 12/21/23 1224  BP: (!) 154/80 120/76 136/73 117/83  Pulse: 62 (!) 58 63 (!) 59  Resp: 17 18 18 18   Temp: 97.6 F (36.4 C) 97.8 F (36.6 C) 98 F (36.7 C) 98.1 F (36.7 C)  TempSrc: Oral  Oral Oral  SpO2: 94% 95% 94% 96%  Weight:      Height:       Neurology awake and alert, positive cognitive impairment and difficulty talking. Responds to simple question and follows simple commands ENT with no pallor Cardiovascular with S1 and S2 present and regular with no gallops, rubs or murmurs Respiratory with no rales or wheezing, no rhonchi  Abdomen with no distention  No lower extremity edema  Data Reviewed:    Family Communication: I spoke with patient's wife at the bedside, we talked in detail about patient's condition, plan of care and prognosis and all questions were addressed.   Disposition: Status is: Inpatient Remains inpatient appropriate because: pending transfer to CIR   Planned Discharge Destination: Rehab    Author: Elidia Toribio Furnace, MD 12/21/2023 1:31 PM  For on call review [email or web address].

## 2023-12-21 NOTE — TOC CM/SW Note (Signed)
 Transition of Care [facility]) - Inpatient Brief Assessment   Patient Details  Name: Alan Wise MRN: [id number] Date of Birth: 01-27-1971  Transition of [facility]) CM/SW Contact:    Almarie CHRISTELLA Goodie, LCSW Phone Number: 12/21/2023, 10:55 AM   Clinical Narrative:   Patient from home with spouse, workup ongoing for CIR, insurance pending. CSW to follow.    Transition of Care Asessment: Insurance and Status: Insurance coverage has been reviewed Patient has primary care physician: Yes Home environment has been reviewed: Home with wife Prior level of function:: Independent Prior/Current Home Services: No current home services Social Drivers of Health Review: SDOH reviewed no interventions necessary Readmission risk has been reviewed: Yes Transition of care needs: transition of care needs identified, TOC will continue to follow

## 2023-12-21 NOTE — Evaluation (Signed)
 Speech Language Pathology Evaluation Patient Details Name: Alan Wise MRN: [id number] DOB: 1971-06-13 Today's Date: 12/21/2023 Time: 8960-8884 SLP Time Calculation (min) (ACUTE ONLY): 36 min  Problem List:  Patient Active Problem List   Diagnosis Date Noted   Acute CVA (cerebrovascular accident) (HCC) 12/18/2023   Psychomotor agitation 12/11/2023   Sepsis due to gram-negative UTI (HCC) 12/09/2023   Abnormal brain MRI 12/09/2023   Dyslipidemia 12/09/2023   Gait disorder 09/14/2023   Seizure disorder (HCC) 09/10/2023   History of CVA (cerebrovascular accident) 09/09/2023   Essential hypertension 09/09/2023   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   Type 2 diabetes mellitus with hyperlipidemia (HCC) 06/12/2014   Hyperlipidemia associated with type 2 diabetes mellitus (HCC) 06/12/2014   Past Medical History:  Past Medical History:  Diagnosis Date   Allergy    Complication of anesthesia    pt reports he is starting to have more difficulty arousing after surgery   Diabetes mellitus (HCC)    GERD (gastroesophageal reflux disease)    Headache    History of kidney stones    Hyperlipidemia    Renal disorder    kidney stones   Past Surgical History:  Past Surgical History:  Procedure Laterality Date   APPLICATION OF CRANIAL NAVIGATION Right 01/17/2021   Procedure: APPLICATION OF CRANIAL NAVIGATION;  Surgeon: Debby Dorn MATSU, MD;  Location: [facility] OR;  Service: Neurosurgery;  Laterality: Right;   COLONOSCOPY  09/03/1994   COLONOSCOPY WITH PROPOFOL  N/A 01/07/2023   Procedure: COLONOSCOPY WITH PROPOFOL ;  Surgeon: Jinny Carmine, MD;  Location: [facility] ENDOSCOPY;  Service: Endoscopy;  Laterality: N/A;  NOT TOO EARLY   CYSTOSCOPY WITH STENT PLACEMENT Right 01/25/2016   Procedure: CYSTOSCOPY WITH STENT PLACEMENT;  Surgeon: Redell Lynwood Napoleon, MD;  Location: [facility] ORS;  Service: Urology;  Laterality: Right;   FRAMELESS  BIOPSY WITH  BRAINLAB Right 01/17/2021   Procedure: RIGHT STEREOTACTIC BIOPSY OF INSULAR LESION;  Surgeon: Debby Dorn MATSU, MD;  Location: [facility] OR;  Service: Neurosurgery;  Laterality: Right;   KNEE ARTHROSCOPY W/ MENISCAL REPAIR Right 06/23/1988   POLYPECTOMY  01/07/2023   Procedure: POLYPECTOMY;  Surgeon: Jinny Carmine, MD;  Location: [facility] ENDOSCOPY;  Service: Endoscopy;;   removal of birthmark  06/08/2013   SKIN CANCER EXCISION  06/23/2012   TYMPANOSTOMY TUBE PLACEMENT  08/06/1978   URETEROSCOPY WITH HOLMIUM LASER LITHOTRIPSY Right 01/25/2016   Procedure: URETEROSCOPY WITH HOLMIUM LASER LITHOTRIPSY;  Surgeon: Redell Lynwood Napoleon, MD;  Location: [facility] ORS;  Service: Urology;  Laterality: Right;   URETHRAL STRICTURE DILATATION     visual inspection of vocal cord     1973   WISDOM TOOTH EXTRACTION     HPI:  53 y.o. male presents to [facility] 12/18/23 after falling two days prior with residual L sided weakness and somnolence. Brain MRI showed new patchy restricted diffusion in R basal ganglia, new punctuate focus of restricted diffusion in R caudate head. Recent admit 6/17-6/19 with anterior R temporal lobe punctate restricted diffusion. PMHx: T2DM, GERD, CVA w/ L sided weakness, oligodendroglioma   Assessment / Plan / Recommendation Clinical Impression   Pt with a hx of mixed expressive and receptive aphasia and cognitive deficits per chart secondary to oligodendroglioma and multiple prior CVAs. Per wife, prior to hospitalization, pt did achieve a point where he was stabilizing with his aphasia, meaning they found successful communication tactics and pt did not appear to regress from his communication abilities at this time. However, since this acute stroke, wife  reported pt's comprehension and expression has further regressed. She reported at baseline he can answer Y/N questions, repeat, and communicate basic wants/needs with multi-modal forms of communication (drawing, communication board, and verbal output). Now,  he is unable to follow simple commands or repeat.  He perseverates on a general phrase that was there as well for the majority of his responses.   Pt presents with moderate-severe mixed expressive and receptive aphasia consistent with global aphasia per informal speech/language assessment completed today. Pt able to identify objects (f=2, f=4) with ~75% accuracy when objects were in visual field. He was able to follow simple, 1-step commands, though challenged with slightly more complex 1-step commands or simple 2-step commands. Pt unable to name objects or repeat with exception of x1 word. Y/N questions were not attempted. Pt able to complete automatic sequences (counting, DOW, and Happy Birthday song) given initial verbal prompt. Part of comprehension deficits appear attributed to reduced attention.   Tx/Education: SLP reviewed communication strategies with pt and wife to help pt focus on one thing at a time and to give commands in a simple and direct manner in effort to improve his comprehension success. Reviewed community caregiver resources such as [employer] once pt is discharged home. Wife grateful for education and verbalized good understanding.   Plan: Continue SLP intervention to address cognition/language goals.  Recommend high intensity SLP services x5 days a week at next level of care.   Swallow Screen: Pt does not have  swallow evaluation orders at this time, though did undergo recent MBSS with recommended modified diet. Per wife, swallow deficits have improved and pt is tolerating a regular diet without concerns for aspiration. They declined the need for a formal swallow assessment at this time.      SLP Assessment  SLP Recommendation/Assessment: Patient needs continued Speech Language Pathology Services SLP Visit Diagnosis: Aphasia (R47.01);Cognitive communication deficit (R41.841)     Assistance Recommended at Discharge  Frequent or constant Supervision/Assistance   Functional Status Assessment Patient has had a recent decline in their functional status and demonstrates the ability to make significant improvements in function in a reasonable and predictable amount of time.  Frequency and Duration min 2x/week  4 weeks      SLP Evaluation Cognition  Overall Cognitive Status: Impaired/Different from baseline Arousal/Alertness: Awake/alert Orientation Level:  (unable to assess due to global aphasia)       Comprehension  Auditory Comprehension Overall Auditory Comprehension: Impaired Yes/No Questions: Not tested Commands: Impaired (able to complete simple, 1-step) Two Step Basic Commands: 0-24% accurate Other Conversation Comments: generalized verbal responses that were nonsensical Interfering Components: Attention EffectiveTechniques: Repetition;Slowed speech;Stressing words Visual Recognition/Discrimination Discrimination: Exceptions to [facility] (objects needed to be in visual field) Reading Comprehension Reading Status: Not tested    Expression Expression Primary Mode of Expression: Verbal (at baseline, pt uses pictures and commnication board to supplement verbal speech) Verbal Expression Overall Verbal Expression: Impaired Initiation: No impairment Automatic Speech: Counting;Day of week;Singing (given cues to start sequence) Level of Generative/Spontaneous Verbalization: Word;Phrase (perseverated on same nonsensical phrase) Repetition: Impaired Level of Impairment: Word level Naming: Impairment Responsive: 0-25% accurate Verbal Errors: Perseveration;Jargon Pragmatics: No impairment Interfering Components: Attention Non-Verbal Means of Communication: Drawing;Communication board (need to attempt these as pt did use these forms of communication at baseline)   Oral / Motor  Oral Motor/Sensory Function Overall Oral Motor/Sensory Function: Within functional limits Motor Speech Overall Motor Speech: Appears within functional limits for tasks  assessed Phonation: Normal Resonance: Within functional limits Articulation: Within  functional limitis Intelligibility: Intelligible Motor Planning: Within functional limits Motor Speech Errors: Not applicable            [name] 12/21/2023, 12:05 PM

## 2023-12-21 NOTE — TOC CAGE-AID Note (Addendum)
 Transition of Care [facility]) - CAGE-AID Screening   Patient Details  Name: Alan Wise MRN: [id number] Date of Birth: Nov 03, 1970  Transition of Care [facility] CM/SW Contact:    Seema Blum E Eliane Hammersmith, LCSW Phone Number: 12/21/2023, 11:01 AM   Clinical Narrative: No SA noted on chart.   CAGE-AID Screening: Substance Abuse Screening unable to be completed due to: : Patient unable to participate

## 2023-12-21 NOTE — Progress Notes (Signed)
 Physical Therapy Treatment Patient Details Name: Alan Wise MRN: [id number] DOB: August 31, 1970 Today's Date: 12/21/2023   History of Present Illness 53 y.o. male presents to [facility] 12/18/23 after falling two days prior with residual L sided weakness and somnolence. Brain MRI showed new patchy restricted diffusion in R basal ganglia, new punctuate focus of restricted diffusion in R caudate head. Recent admit 6/17-6/19 with anterior R temporal lobe punctate restricted diffusion. PMHx: T2DM, GERD, CVA w/ L sided weakness, oligodendroglioma    PT Comments  Pt received in supine and agreeable to session. Pt demonstrates slightly improved balance this session, however continues to require intermittent min A to correct LOB during ambulation and stair trials. Pt able to tolerate LLE strengthening and increased gait distance. Pt requires cues for increased use of LUE during session and demonstrates some increased tone, education provided on positioning. Pt continues to benefit from PT services to progress toward functional mobility goals.     If plan is discharge home, recommend the following: Assistance with cooking/housework;Assist for transportation;Help with stairs or ramp for entrance;A lot of help with walking and/or transfers;A lot of help with bathing/dressing/bathroom;Direct supervision/assist for medications management;Direct supervision/assist for financial management   Can travel by private vehicle        Equipment Recommendations  None recommended by PT    Recommendations for Other Services       Precautions / Restrictions Precautions Precautions: Fall Recall of Precautions/Restrictions: Impaired Precaution/Restrictions Comments: L visual field cut at baseline Restrictions Weight Bearing Restrictions Per Provider Order: No     Mobility  Bed Mobility Overal bed mobility: Needs Assistance Bed Mobility: Supine to Sit, Sit to Supine     Supine to sit: Supervision, HOB  elevated Sit to supine: Supervision, HOB elevated   General bed mobility comments: increased time and cues for use of LUE    Transfers Overall transfer level: Needs assistance Equipment used: None Transfers: Sit to/from Stand Sit to Stand: Min assist           General transfer comment: Min A for steadying    Ambulation/Gait Ambulation/Gait assistance: Min Chemical engineer (Feet): 200 Feet Assistive device: None Gait Pattern/deviations: Step-through pattern, Decreased stride length, Shuffle, Trunk flexed, Drifts right/left       General Gait Details: unsteady gait with pt mostly able to correct with intermittent min A for steadying   Stairs Stairs: Yes Stairs assistance: Min assist Stair Management: One rail Right, Step to pattern, Forwards, Alternating pattern, Two rails Number of Stairs: 5 General stair comments: increased time and some instability requiring min A for balance   Wheelchair Mobility     Tilt Bed    Modified Rankin (Stroke Patients Only) Modified Rankin (Stroke Patients Only) Pre-Morbid Rankin Score: Moderate disability Modified Rankin: Moderately severe disability     Balance Overall balance assessment: Needs assistance, History of Falls Sitting-balance support: Feet supported Sitting balance-Leahy Scale: Fair Sitting balance - Comments: sitting EOB   Standing balance support: No upper extremity supported, During functional activity Standing balance-Leahy Scale: Fair Standing balance comment: CGA-min A without AD                            Communication Communication Communication: Impaired Factors Affecting Communication: Difficulty expressing self;Reduced clarity of speech  Cognition Arousal: Alert Behavior During Therapy: WFL for tasks assessed/performed   PT - Cognitive impairments: Memory, Attention, Sequencing, Problem solving, Orientation  Following commands: Intact       Cueing Cueing Techniques: Verbal cues, Tactile cues, Visual cues  Exercises Other Exercises Other Exercises: LLE step ups 2 sets x10 reps    General Comments        Pertinent Vitals/Pain Pain Assessment Pain Assessment: Faces Faces Pain Scale: Hurts a little bit Pain Location: L shoulder with mobility Pain Descriptors / Indicators: Discomfort Pain Intervention(s): Limited activity within patient's tolerance, Monitored during session, Repositioned     PT Goals (current goals can now be found in the care plan section) Acute Rehab PT Goals Patient Stated Goal: to get more rehab PT Goal Formulation: With patient/family Time For Goal Achievement: 01/02/24 Progress towards PT goals: Progressing toward goals    Frequency    Min 3X/week       AM-PAC PT 6 Clicks Mobility   Outcome Measure  Help needed turning from your back to your side while in a flat bed without using bedrails?: A Little Help needed moving from lying on your back to sitting on the side of a flat bed without using bedrails?: A Little Help needed moving to and from a bed to a chair (including a wheelchair)?: A Little Help needed standing up from a chair using your arms (e.g., wheelchair or bedside chair)?: A Little Help needed to walk in hospital room?: A Little Help needed climbing 3-5 steps with a railing? : A Little 6 Click Score: 18    End of Session Equipment Utilized During Treatment: Gait belt Activity Tolerance: Patient tolerated treatment well Patient left: in bed;with call bell/phone within reach;with family/visitor present;with bed alarm set Nurse Communication: Mobility status PT Visit Diagnosis: Unsteadiness on feet (R26.81);Other abnormalities of gait and mobility (R26.89);Muscle weakness (generalized) (M62.81)     Time: 9145-9080 PT Time Calculation (min) (ACUTE ONLY): 25 min  Charges:    $Gait Training: 8-22 mins $Therapeutic Exercise: 8-22 mins PT General Charges $$ ACUTE PT  VISIT: 1 Visit                     Darryle George, PTA Acute Rehabilitation Services Secure Chat Preferred  [phone]    Darryle George 12/21/2023, 10:49 AM

## 2023-12-21 NOTE — Plan of Care (Signed)
  Problem: Ischemic Stroke/TIA Tissue Perfusion: Goal: Complications of ischemic stroke/TIA will be minimized Outcome: Progressing   Problem: Coping: Goal: Will verbalize positive feelings about self Outcome: Progressing   Problem: Coping: Goal: Will identify appropriate support needs Outcome: Progressing   Problem: Health Behavior/Discharge Planning: Goal: Ability to manage health-related needs will improve Outcome: Progressing   Problem: Self-Care: Goal: Ability to communicate needs accurately will improve Outcome: Progressing   Problem: Nutrition: Goal: Risk of aspiration will decrease Outcome: Progressing   Problem: Clinical Measurements: Goal: Will remain free from infection Outcome: Progressing   Problem: Activity: Goal: Risk for activity intolerance will decrease Outcome: Progressing   Problem: Safety: Goal: Ability to remain free from injury will improve Outcome: Progressing

## 2023-12-21 NOTE — Therapy (Signed)
 SPEECH THERAPY DISCHARGE SUMMARY  Visits from Start of Care: 9  Current functional level related to goals / functional outcomes: Moderate to severe aphasia, goals not met, pt d/c'd due to hospitalization   Remaining deficits: Aphasia, cognitive communication impairment   Education / Equipment: Multimodal communication/AAC; compensations for aphasia   Patient agrees to discharge. Patient goals were not met. Patient is being discharged due to a change in medical status.SABRA

## 2023-12-21 NOTE — Consult Note (Signed)
 [facility] Neuro-Oncology Consult Note  Patient Care Team: Antonette Angeline ORN, NP as PCP - General (Internal Medicine) Homsher, Wanda, RN as VBCI Care Management  CHIEF COMPLAINTS/PURPOSE OF CONSULTATION:  Glioma Stroke  HISTORY OF PRESENTING ILLNESS:  Alan Wise 53 y.o. male presented with 1-2days of progressive left face, arm and leg weakness.  Found to have stroke on MRI, currently with limited use of his left side.  Language/speech has also been more difficult during this time.    MEDICAL HISTORY:  Past Medical History:  Diagnosis Date   Allergy    Complication of anesthesia    pt reports he is starting to have more difficulty arousing after surgery   Diabetes mellitus (HCC)    GERD (gastroesophageal reflux disease)    Headache    History of kidney stones    Hyperlipidemia    Renal disorder    kidney stones    SURGICAL HISTORY: Past Surgical History:  Procedure Laterality Date   APPLICATION OF CRANIAL NAVIGATION Right 01/17/2021   Procedure: APPLICATION OF CRANIAL NAVIGATION;  Surgeon: Debby Dorn MATSU, MD;  Location: [facility] OR;  Service: Neurosurgery;  Laterality: Right;   COLONOSCOPY  09/03/1994   COLONOSCOPY WITH PROPOFOL  N/A 01/07/2023   Procedure: COLONOSCOPY WITH PROPOFOL ;  Surgeon: Jinny Carmine, MD;  Location: [facility] ENDOSCOPY;  Service: Endoscopy;  Laterality: N/A;  NOT TOO EARLY   CYSTOSCOPY WITH STENT PLACEMENT Right 01/25/2016   Procedure: CYSTOSCOPY WITH STENT PLACEMENT;  Surgeon: Redell Lynwood Napoleon, MD;  Location: [facility] ORS;  Service: Urology;  Laterality: Right;   FRAMELESS  BIOPSY WITH BRAINLAB Right 01/17/2021   Procedure: RIGHT STEREOTACTIC BIOPSY OF INSULAR LESION;  Surgeon: Debby Dorn MATSU, MD;  Location: [facility] OR;  Service: Neurosurgery;  Laterality: Right;   KNEE ARTHROSCOPY W/ MENISCAL REPAIR Right 06/23/1988   POLYPECTOMY  01/07/2023   Procedure: POLYPECTOMY;  Surgeon: Jinny Carmine, MD;  Location: [facility] ENDOSCOPY;  Service:  Endoscopy;;   removal of birthmark  06/08/2013   SKIN CANCER EXCISION  06/23/2012   TYMPANOSTOMY TUBE PLACEMENT  08/06/1978   URETEROSCOPY WITH HOLMIUM LASER LITHOTRIPSY Right 01/25/2016   Procedure: URETEROSCOPY WITH HOLMIUM LASER LITHOTRIPSY;  Surgeon: Redell Lynwood Napoleon, MD;  Location: [facility] ORS;  Service: Urology;  Laterality: Right;   URETHRAL STRICTURE DILATATION     visual inspection of vocal cord     1973   WISDOM TOOTH EXTRACTION      SOCIAL HISTORY: Social History   Socioeconomic History   Marital status: Married    Spouse name: Not on file   Number of children: Not on file   Years of education: Not on file   Highest education level: Not on file  Occupational History   Not on file  Tobacco Use   Smoking status: Never   Smokeless tobacco: Never  Vaping Use   Vaping status: Never Used  Substance and Sexual Activity   Alcohol use: Not Currently    Comment: rare   Drug use: No   Sexual activity: Not Currently  Other Topics Concern   Not on file  Social History Narrative   Not on file   Social Drivers of Health   Financial Resource Strain: Low Risk  (12/05/2022)   Received from [facility]   Overall Financial Resource Strain (CARDIA)    Difficulty of Paying Living Expenses: Not hard at all  Food Insecurity: No Food Insecurity (12/18/2023)   Hunger Vital Sign    Worried About Running Out of Food in  the Last Year: Never true    Ran Out of Food in the Last Year: Never true  Transportation Needs: No Transportation Needs (12/18/2023)   PRAPARE - Administrator, Civil Service (Medical): No    Lack of Transportation (Non-Medical): No  Physical Activity: Not on file  Stress: Not on file  Social Connections: Moderately Isolated (12/09/2023)   Social Connection and Isolation Panel    Frequency of Communication with Friends and Family: More than three times a week    Frequency of Social Gatherings with Friends and Family: More than three  times a week    Attends Religious Services: Never    Database administrator or Organizations: No    Attends Banker Meetings: Never    Marital Status: Married  Catering manager Violence: Not At Risk (12/18/2023)   Humiliation, Afraid, Rape, and Kick questionnaire    Fear of Current or Ex-Partner: No    Emotionally Abused: No    Physically Abused: No    Sexually Abused: No    FAMILY HISTORY: Family History  Problem Relation Age of Onset   Hyperlipidemia Father    Hypertension Father    Diabetes Father    Benign prostatic hyperplasia Father    Stroke Maternal Grandmother    Diabetes Maternal Grandmother    Stroke Paternal Grandmother    Hypertension Paternal Grandmother    Kidney disease Neg Hx    Prostate cancer Neg Hx     ALLERGIES:  is allergic to contrast media [iodinated contrast media].  MEDICATIONS:  Current Facility-Administered Medications  Medication Dose Route Frequency Provider Last Rate Last Admin   acetaminophen  (TYLENOL ) tablet 650 mg  650 mg Oral Q6H PRN Arrien, Elidia Sieving, MD       Or   acetaminophen  (TYLENOL ) suppository 650 mg  650 mg Rectal Q6H PRN Arrien, Elidia Sieving, MD       amLODipine  (NORVASC ) tablet 5 mg  5 mg Oral Daily Arrien, Elidia Sieving, MD   5 mg at 12/21/23 9051   aspirin  EC tablet 81 mg  81 mg Oral Daily Arrien, Elidia Sieving, MD   81 mg at 12/21/23 9051   atorvastatin  (LIPITOR) tablet 40 mg  40 mg Oral Daily Arrien, Mauricio Daniel, MD   40 mg at 12/21/23 9050   lamoTRIgine  (LAMICTAL ) tablet 150 mg  150 mg Oral Daily Bhagat, Srishti L, MD   150 mg at 12/21/23 9051   And   lamoTRIgine  (LAMICTAL ) tablet 200 mg  200 mg Oral QHS Bhagat, Srishti L, MD   200 mg at 12/20/23 2124   ondansetron  (ZOFRAN ) tablet 4 mg  4 mg Oral Q6H PRN Arrien, Mauricio Daniel, MD       Or   ondansetron  (ZOFRAN ) injection 4 mg  4 mg Intravenous Q6H PRN Arrien, Elidia Sieving, MD       propranolol  (INDERAL ) tablet 20 mg  20 mg Oral BID  Arrien, Elidia Sieving, MD   20 mg at 12/21/23 9051    REVIEW OF SYSTEMS:   Constitutional: Denies fevers, chills or abnormal weight loss Eyes: Denies blurriness of vision Ears, nose, mouth, throat, and face: Denies mucositis or sore throat Respiratory: Denies cough, dyspnea or wheezes Cardiovascular: Denies palpitation, chest discomfort or lower extremity swelling Gastrointestinal:  Denies nausea, constipation, diarrhea GU: Denies dysuria or incontinence Skin: Denies abnormal skin rashes Neurological: Per HPI Musculoskeletal: Denies joint pain, back or neck discomfort. No decrease in ROM Behavioral/Psych: Denies anxiety, disturbance in thought content, and mood  instability   PHYSICAL EXAMINATION: Vitals:   12/21/23 1224 12/21/23 1617  BP: 117/83 134/81  Pulse: (!) 59 60  Resp: 18 18  Temp: 98.1 F (36.7 C) 97.7 F (36.5 C)  SpO2: 96% 93%   KPS: 60. General: Alert, cooperative, pleasant, in no acute distress Head: Normal EENT: No conjunctival injection or scleral icterus. Oral mucosa moist Lungs: Resp effort normal Cardiac: Regular rate and rhythm Abdomen: Soft, non-distended abdomen Skin: No rashes cyanosis or petechiae. Extremities: No clubbing or edema  NEUROLOGIC EXAM: Mental Status: Awake, alert, attentive to examiner. Oriented to self and environment. Language is densely impaired with regards to fluency.  Cranial Nerves: Visual acuity is grossly normal. Visual fields are full. Extra-ocular movements intact. No ptosis. Face is symmetric, tongue midline. L UMN facial paresis Motor: Tone and bulk are normal. Power is 3/5 in left arm and leg. Reflexes are symmetric, no pathologic reflexes present. Intact finger to nose bilaterally Sensory: Intact to light touch and temperature Gait: Non ambulatory   LABORATORY DATA:  I have reviewed the data as listed Lab Results  Component Value Date   WBC 6.9 12/19/2023   HGB 14.7 12/19/2023   HCT 43.5 12/19/2023   MCV 86.0  12/19/2023   PLT 247 12/19/2023   Recent Labs    09/21/23 0540 12/08/23 2059 12/09/23 0427 12/10/23 0434 12/18/23 1425 12/18/23 1440 12/19/23 0523  NA 140 133*   < > 139 139 138 140  K 4.3 4.0   < > 4.2 4.6 4.4 3.9  CL 101 93*   < > 105 98 100 103  CO2 25 27   < > 26  --  29 27  GLUCOSE 102* 139*   < > 108* 107* 105* 75  BUN 13 19   < > 18 19 15 13   CREATININE 1.03 0.91   < > 0.80 1.20 1.14 0.90  CALCIUM  9.6 9.4   < > 8.8*  --  9.9 9.3  GFRNONAA >60 >60   < > >60  --  >60 >60  PROT 6.4* 8.1  --   --   --  7.7  --   ALBUMIN 3.8 4.5  --   --   --  4.3  --   AST 19 22  --   --   --  27  --   ALT 29 19  --   --   --  44  --   ALKPHOS 71 77  --   --   --  62  --   BILITOT 0.6 1.6*  --   --   --  0.6  --    < > = values in this interval not displayed.    RADIOGRAPHIC STUDIES: I have personally reviewed the radiological images as listed and agreed with the findings in the report. DG Shoulder Left Result Date: 12/19/2023 CLINICAL DATA:  855384 Pain [id number] EXAM: LEFT SHOULDER - 2+ VIEW COMPARISON:  April 11, 2023 FINDINGS: No acute fracture or dislocation. There is no evidence of arthropathy or other focal bone abnormality. Soft tissues are unremarkable. IMPRESSION: No acute fracture or dislocation. Electronically Signed   By: Rogelia Myers M.D.   On: 12/19/2023 19:21   EEG adult Result Date: 12/19/2023 Shelton Alan KIDD, MD     12/19/2023  4:51 PM Patient Name: Alan Wise MRN: [id number] Epilepsy Attending: [name] Shelton Referring Physician/Provider: Noralee Elidia Sieving, MD Date: 12/19/2023 Duration: 27.36 mins Patient history: 53 y.o. 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 EEG to evaluate for seizure Level of alertness: Awake, asleep AEDs during EEG study: LTG Technical aspects: This EEG study was done with scalp electrodes positioned according to the 10-20 International system of electrode  placement. Electrical activity was reviewed with band pass filter of 1-70Hz , sensitivity of 7 uV/mm, display speed of 46mm/sec with a 60Hz  notched filter applied as appropriate. EEG data were recorded continuously and digitally stored.  Video monitoring was available and reviewed as appropriate. Description: The posterior dominant rhythm consists of 8 Hz activity of moderate voltage (25-35 uV) seen predominantly in posterior head regions, symmetric and reactive to eye opening and eye closing. Sleep was characterized by vertex waves, sleep spindles (12 to 14 Hz), maximal frontocentral region. EEG showed continuous 3 to 6 Hz theta-delta slowing in right hemisphere, maximal right fronto-temporal region. Hyperventilation and photic stimulation were not performed.   ABNORMALITY - Continuous slow, right hemisphere, maximal right fronto-temporal region IMPRESSION: This study is suggestive of cortical dysfunction arising from right hemisphere, maximal right fronto-temporal region likely secondary to underlying structural abnormality. No seizures or definite epileptiform discharges were seen throughout the recording. Alan Wise   MR BRAIN WO CONTRAST Result Date: 12/18/2023 CLINICAL DATA:  Provided history: History of stroke, acute worsening of chronic left-sided weakness. Additional history obtained from prior radiology records: History of oligodendroglioma. EXAM: MRI HEAD WITHOUT CONTRAST TECHNIQUE: Multiplanar, multiecho pulse sequences of the brain and surrounding structures were obtained without intravenous contrast. COMPARISON:  Non-contrast head CT performed earlier today 12/18/2023. Non-contrast head CT and CT angiogram head/neck 12/09/2023. Prior brain MRI examinations 12/09/2023 and earlier. FINDINGS: Brain: Stable cerebral volume. New from the recent prior brain MRI 12/09/2023, there is a focus of patchy restricted diffusion within the right basal ganglia measuring 3.2 x 1.3 cm. There is also a new  punctate focus of restricted diffusion within the right caudate head (series 2, image 25). These foci are favored to reflect acute infarcts. The small focus of restricted diffusion demonstrated within the superior right temporal lobe on the prior brain MRI is no longer present. Otherwise stable signal abnormality within the right frontal lobe/insula, extending to the right temporal lobe, region of the right deep gray nuclei, right periatrial white matter, anterior body/genu of corpus callosum and slightly into the adjacent anterior left frontal lobe white matter. Superimposed patchy and irregular foci of diffusion-weighted signal abnormality at these sites are otherwise unchanged and these findings are consistent with a combination of tumor and post-treatment changes. Associated foci of dystrophic calcification and likely chronic hemosiderin deposition. Unchanged focus of chronic encephalomalacia/gliosis within the anterolateral right frontal lobe/insula. Redemonstrated tract (with associated chronic hemosiderin deposition) in the right frontal lobe, presumably from prior biopsy. Ex vacuo dilatation of the right lateral ventricle. Unchanged chronic T2 FLAIR hyperintense signal changes within the pons, nonspecific No extra-axial fluid collection. No midline shift. Vascular: Maintained flow voids within the proximal large arterial vessels. Skull and upper cervical spine: No focal worrisome marrow lesion. Sinuses/Orbits: No mass or acute finding within the imaged orbits. Mucosal thickening within the bilateral frontal sinuses (mild right, moderate left). Severe bilateral ethmoid sinusitis. Mild mucosal thickening within the bilateral maxillary sinuses. Other: Trace fluid within left mastoid air cells. IMPRESSION: 1. 3.2 x 1.3 cm focus of patchy restricted diffusion within the right basal ganglia, new from the recent prior brain MRI of 12/09/2023. New punctate focus of restricted diffusion within the right caudate head.  Given the appearance  and rapid development, these findings are strongly favored to reflect acute infarcts. However, a short-interval follow-up brain MRI (in 6 weeks) is recommended to exclude any tumor progression. 2. A small focus of restricted diffusion demonstrated within the right temporal lobe on the prior MRI is no longer present. This likely reflected an acute infarct on the prior exam. 3. Otherwise stable appearance of the patient's known tumor and post-treatment changes. 4. Paranasal sinus disease as described. Electronically Signed   By: Rockey Childs D.O.   On: 12/18/2023 16:47   CT HEAD WO CONTRAST Result Date: 12/18/2023 CLINICAL DATA:  Neuro deficit, acute, stroke suspected. History of oligodendroglioma status post treatment. EXAM: CT HEAD WITHOUT CONTRAST TECHNIQUE: Contiguous axial images were obtained from the base of the skull through the vertex without intravenous contrast. RADIATION DOSE REDUCTION: This exam was performed according to the departmental dose-optimization program which includes automated exposure control, adjustment of the mA and/or kV according to patient size and/or use of iterative reconstruction technique. COMPARISON:  CT of the head dated December 09, 2023. FINDINGS: Brain: Moderate generalized is cerebral volume loss. Curvilinear areas of calcification are again demonstrated within the periventricular and subcortical white matter of the right frontal lobe. Dystrophic calcifications are also seen anteromedial to the atrium of the right lateral ventricle. There are encephalomalacia changes and ex vacuo dilatation of the right lateral ventricle, which appears similar to the prior exam. There is no evidence of acute hemorrhage or infarction. Vascular: Moderate calcific atheromatous disease within the carotid siphons and right vertebral artery. Skull: Right frontal burr craniotomy defects. The calvaria is otherwise intact. No osseous lesions present. Sinuses/Orbits: Moderate mucosal  disease within the frontal, ethmoid and maxillary sinuses. The orbits are unremarkable. Other: None. IMPRESSION: 1. Stable encephalomalacia changes and dystrophic calcification within the right cerebral hemisphere, compatible with previously treated neoplasm. No adverse interval change. 2. Mild-to-moderate paranasal sinus disease. Electronically Signed   By: [name] M.D.   On: 12/18/2023 15:37   DG Swallowing Func-Speech Pathology Result Date: 12/10/2023 Table formatting from the original result was not included. Modified Barium Swallow Study Patient Details Name: Alan Wise MRN: [id number] Date of Birth: 09-10-1970 Today's Date: 12/10/2023 HPI/PMH: HPI: Per MD progress note, Alan Wise is a 53 y.o. Caucasian male with medical history significant for oligodendroglioma, GERD, type 2 diabetes mellitus, dyslipidemia and urolithiasis, presented to the emergency room with acute onset of altered mental status and recently diminished appetite over the last 4 days.  He saw his PCP earlier this week and had an MRI as he has been having slowly increasing weakness and difficulty with his speech which has been more difficult to understand.  He has not been ambulating as usual.  He has been more fatigued and tired and somnolent. MRI 12/09/23: New punctate focus of restricted diffusion in the anterior right  temporal lobe could represent a small acute infarct (favored) or  new/progressive small site of tumor.  2. Otherwise, unchanged appearance of multifocal signal abnormality  restricted diffusion in the right frontal and right temporal lobes  and corpus callosum, compatible with known tumor. CXR 6/17: No active disease. Clinical Impression: Clinical Impression: Pt seen for MBSS demonstrating mild pharyngoesophageal phase dysphagia. Pt presents with improvement in pharyngeal/esophageal phase of swallow in comparison with MBSS completed in March 2025. This date pt with continued residue at the UES and  reduced distention of UES coupled with retention in the esophagus for more viscous substances- though no retrograde movement. Residue spilling into pyriform  sinus from the UES space, though not continuing to the laryngeal vestibule. No overt airway compromise across consistencies. Cough noted x1 without evidence of penetration/aspiration. Oral phase grossly intact.     Despite lack of aspiration during study- given known choking and mild deficits related to UES distention/residue and reflux/retention- pt is at increased risk for aspiration, specifically with substances of increased viscosity. Trials of liquid wash and double swallow aided to reduce residue.     Continue to recommend dys 2 (chopped) solids to aid clearance and use of compensatory strategies- double swallow and liquid wash. Recommend aspiration precautions- slow rate, small bites, elevated HOB (for at least 30 min after swallow), and alert for PO intake. Pt and spouse aware of recommendations. RN and MD notified of results. Recommend follow up with OP SLP services (pt is currently on OP caseload). Factors that may increase risk of adverse event in presence of aspiration [name] & [name] 2021): Factors that may increase risk of adverse event in presence of aspiration [name] & [name] 2021): Frail or deconditioned; Limited mobility (min deconditioned) Recommendations/Plan: Swallowing Evaluation Recommendations Swallowing Evaluation Recommendations Recommendations: PO diet PO Diet Recommendation: Dysphagia 2 (Finely chopped); Thin liquids (Level 0) Liquid Administration via: Cup; Straw Medication Administration: Whole meds with liquid Supervision: Patient able to self-feed; Intermittent supervision/cueing for swallowing strategies Swallowing strategies  : Slow rate; Small bites/sips; Follow solids with liquids Postural changes: Position pt fully upright for meals; Stay upright 30-60 min after meals Oral care recommendations: Oral care BID (2x/day)  Treatment Plan Treatment Plan Treatment recommendations: Defer treatment plan to SLP at other venue (see follow-up recommendations) (follow up with OP SLP) Follow-up recommendations: Follow physicians's recommendations for discharge plan and follow up therapies (return to OP SLP services) Functional status assessment: -- (pt appears to be approaching baseline) Recommendations Recommendations for follow up therapy are one component of a multi-disciplinary discharge planning process, led by the attending physician.  Recommendations may be updated based on patient status, additional functional criteria and insurance authorization. Assessment: Orofacial Exam: Orofacial Exam Oral Cavity: Oral Hygiene: WFL Oral Cavity - Dentition: Adequate natural dentition Orofacial Anatomy: WFL Oral Motor/Sensory Function: WFL (pt indicating residual left orofacial weakness) Anatomy: Anatomy: WFL Boluses Administered: Boluses Administered Boluses Administered: Thin liquids (Level 0); Mildly thick liquids (Level 2, nectar thick); Puree; Solid  Oral Impairment Domain: Oral Impairment Domain Lip Closure: No labial escape Tongue control during bolus hold: Escape to lateral buccal cavity/floor of mouth Bolus preparation/mastication: Timely and efficient chewing and mashing Bolus transport/lingual motion: Brisk tongue motion Oral residue: Trace residue lining oral structures Location of oral residue : Tongue Initiation of pharyngeal swallow : Posterior angle of the ramus  Pharyngeal Impairment Domain: Pharyngeal Impairment Domain Soft palate elevation: No bolus between soft palate (SP)/pharyngeal wall (PW) Laryngeal elevation: Complete superior movement of thyroid  cartilage with complete approximation of arytenoids to epiglottic petiole Anterior hyoid excursion: Complete anterior movement Epiglottic movement: Complete inversion Laryngeal vestibule closure: Complete, no air/contrast in laryngeal vestibule Pharyngeal stripping wave : Present -  complete Pharyngeal contraction (A/P view only): N/A Pharyngoesophageal segment opening: Partial distention/partial duration, partial obstruction of flow Tongue base retraction: No contrast between tongue base and posterior pharyngeal wall (PPW) Pharyngeal residue: Trace residue within or on pharyngeal structures Location of pharyngeal residue: Pyriform sinuses (trace)  Esophageal Impairment Domain: Esophageal Impairment Domain Esophageal clearance upright position: Esophageal retention Pill: Pill Consistency administered: -- (n/a) Penetration/Aspiration Scale Score: Penetration/Aspiration Scale Score 1.  Material does not enter airway: Thin liquids (Level 0); Mildly  thick liquids (Level 2, nectar thick); Puree; Solid Compensatory Strategies: Compensatory Strategies Compensatory strategies: No   General Information: Caregiver present: No  Diet Prior to this Study: Thin liquids (Level 0); Dysphagia 2 (finely chopped)   Temperature : Normal   Respiratory Status: WFL   Supplemental O2: None (Room air)   History of Recent Intubation: No  Behavior/Cognition: Alert; Cooperative Self-Feeding Abilities: Able to self-feed Baseline vocal quality/speech: Normal Volitional Cough: Able to elicit Volitional Swallow: Able to elicit Exam Limitations: No limitations Goal Planning: Prognosis for improved oropharyngeal function: -- (defer until completion of MBSS) No data recorded No data recorded Patient/Family Stated Goal: to eat as able Consulted and agree with results and recommendations: Patient; Family member/caregiver Pain: Pain Assessment Pain Assessment: No/denies pain End of Session: Start Time:SLP Start Time (ACUTE ONLY): 1150 Stop Time: SLP Stop Time (ACUTE ONLY): 1235 Time Calculation:SLP Time Calculation (min) (ACUTE ONLY): 45 min Charges: SLP Evaluations $ SLP Speech Visit: 1 Visit SLP Evaluations $BSS Swallow: 1 Procedure $MBS Swallow: 1 Procedure SLP visit diagnosis: SLP Visit Diagnosis: Dysphagia, pharyngoesophageal  phase (R13.14) Past Medical History: Past Medical History: Diagnosis Date  Allergy   Complication of anesthesia   pt reports he is starting to have more difficulty arousing after surgery  Diabetes mellitus (HCC)   GERD (gastroesophageal reflux disease)   Headache   History of kidney stones   Hyperlipidemia   Renal disorder   kidney stones Past Surgical History: Past Surgical History: Procedure Laterality Date  APPLICATION OF CRANIAL NAVIGATION Right 01/17/2021  Procedure: APPLICATION OF CRANIAL NAVIGATION;  Surgeon: Debby Dorn MATSU, MD;  Location[facility] OR;  Service: Neurosurgery;  Laterality: Right;  COLONOSCOPY  09/03/1994  COLONOSCOPY WITH PROPOFOL  N/A 01/07/2023  Procedure: COLONOSCOPY WITH PROPOFOL ;  Surgeon: Jinny Carmine, MD;  Location: [facility] ENDOSCOPY;  Service: Endoscopy;  Laterality: N/A;  NOT TOO EARLY  CYSTOSCOPY WITH STENT PLACEMENT Right 01/25/2016  Procedure: CYSTOSCOPY WITH STENT PLACEMENT;  Surgeon: Redell Lynwood Napoleon, MD;  Location: [facility] ORS;  Service: Urology;  Laterality: Right;  FRAMELESS  BIOPSY WITH BRAINLAB Right 01/17/2021  Procedure: RIGHT STEREOTACTIC BIOPSY OF INSULAR LESION;  Surgeon: Debby Dorn MATSU, MD;  Location: [facility] OR;  Service: Neurosurgery;  Laterality: Right;  KNEE ARTHROSCOPY W/ MENISCAL REPAIR Right 06/23/1988  POLYPECTOMY  01/07/2023  Procedure: POLYPECTOMY;  Surgeon: Jinny Carmine, MD;  Location: [facility] ENDOSCOPY;  Service: Endoscopy;;  removal of birthmark  06/08/2013  SKIN CANCER EXCISION  06/23/2012  TYMPANOSTOMY TUBE PLACEMENT  08/06/1978  URETEROSCOPY WITH HOLMIUM LASER LITHOTRIPSY Right 01/25/2016  Procedure: URETEROSCOPY WITH HOLMIUM LASER LITHOTRIPSY;  Surgeon: Redell Lynwood Napoleon, MD;  Location: [facility] ORS;  Service: Urology;  Laterality: Right;  URETHRAL STRICTURE DILATATION    visual inspection of vocal cord    1973  WISDOM TOOTH EXTRACTION   [place] Jarrett Clapp, MS, CCC-SLP Speech Language Pathologist Rehab Services; [facility] [phone] (ascom) [place] J Clapp  12/10/2023, 12:54 PM  CT ANGIO HEAD NECK W WO CM Result Date: 12/09/2023 CLINICAL DATA:  Stroke/TIA, determine embolic source. Altered mental status, behavioral change since yesterday, increased slurred speech and gait changes. EXAM: CT ANGIOGRAPHY HEAD AND NECK WITH AND WITHOUT CONTRAST TECHNIQUE: Multidetector CT imaging of the head and neck was performed using the standard protocol during bolus administration of intravenous contrast. Multiplanar CT image reconstructions and MIPs were obtained to evaluate the vascular anatomy. Carotid stenosis measurements (when applicable) are obtained utilizing NASCET criteria, using the distal internal carotid diameter as the denominator. RADIATION DOSE REDUCTION: This exam was  performed according to the departmental dose-optimization program which includes automated exposure control, adjustment of the mA and/or kV according to patient size and/or use of iterative reconstruction technique. CONTRAST:  75mL OMNIPAQUE  IOHEXOL  350 MG/ML SOLN COMPARISON:  MRI head earlier same day. FINDINGS: CT HEAD FINDINGS Brain: No acute intracranial hemorrhage. Heterogeneous areas of mineralization within the right frontal lobe with associated edema corresponding to findings of tumor on same day MRI. Similar focus of mineralization and edema within the right temporal periventricular white matter. Nonspecific hypoattenuation in the periventricular and subcortical white matter favored to reflect chronic microvascular ischemic changes. Generalized parenchymal volume loss. The basilar cisterns are patent. Posterior fossa is unremarkable. Ventricles: Ventricles are normal in size and configuration. Vascular: No hyperdense vessel. Intracranial atherosclerotic calcifications noted. Skull: No acute or aggressive finding. Sinuses/orbits: Orbits are symmetric. Mucosal thickening in the ethmoid sinuses, left greater than right. Other: Mastoid air cells are clear. CTA NECK FINDINGS Aortic arch: Standard  configuration of the aortic arch. Imaged portion shows no evidence of aneurysm or dissection. No significant stenosis of the major arch vessel origins. Pulmonary arteries: As permitted by contrast timing, there are no filling defects in the visualized pulmonary arteries. Subclavian arteries: The subclavian arteries are patent bilaterally. Right carotid system: Patent. Mild atherosclerosis at the carotid bifurcation without hemodynamically significant stenosis. No evidence of dissection. Left carotid system: Patent. Mild atherosclerosis at the carotid bifurcation without hemodynamically significant stenosis. No evidence of dissection. Vertebral arteries: Codominant. No evidence of dissection, stenosis (50% or greater), or occlusion. Atherosclerosis at the left vertebral artery origin resulting in mild stenosis. Skeleton: No acute or aggressive finding noted. Other neck: The visualized airway is patent. No cervical lymphadenopathy. Upper chest: Visualized lung apices are clear. Review of the MIP images confirms the above findings CTA HEAD FINDINGS ANTERIOR CIRCULATION: The intracranial ICAs are patent bilaterally. Mild atherosclerosis of the carotid siphons. No significant stenosis, proximal occlusion, aneurysm, or vascular malformation. MCAs: Patent bilaterally. Mild to moderate multifocal narrowing of an M2 inferior division branch of the right MCA. ACAs: Patent bilaterally. 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 There is severe narrowing of the A3 and proximal A4 segments of the right ACA. POSTERIOR CIRCULATION: No significant stenosis, proximal occlusion, aneurysm, or vascular malformation. PCAs: The PCAs are patent proximally. Fetal origin of both PCAs. There is severe stenosis and occlusion of the P3 segment of the right PCA. Few small caliber distal PCA branches noted likely perfused by collaterals. Pcomm: The posterior communicating arteries are visualized bilaterally. SCAs:  The superior cerebellar arteries are patent bilaterally. Basilar artery: Patent.  Distal tapering of the basilar artery. AICAs: Not well visualized. PICAs: Patent Vertebral arteries: The intracranial vertebral arteries are patent. Venous sinuses: As permitted by contrast timing, patent. Anatomic variants: None Review of the MIP images confirms the above findings IMPRESSION: No CT evidence of acute intracranial abnormality. Heterogeneous areas of mineralization and surrounding edema in the right frontal lobe and right temporal periventricular white matter corresponding to regions of tumor noted on same day MRI. 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 Intracranial arterial vasculature is otherwise patent. 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 Mild-to-moderate multifocal stenosis of an M2 inferior division branch of the right MCA. No high-grade stenosis of the arteries in the neck. Electronically Signed   By: Donnice Mania M.D.   On: 12/09/2023 13:48   MR Brain W and Wo Contrast Result Date: 12/09/2023 CLINICAL DATA:  Delirium had MRI about 1 week ago, since then has had increased difficulty speaking, R leg weakness, increased somnolence. eval for stroke, edema, tumor progression etc EXAM: MRI HEAD WITHOUT AND WITH CONTRAST TECHNIQUE: Multiplanar, multiecho pulse sequences of the brain and surrounding structures were obtained without and with intravenous contrast. CONTRAST:  6mL GADAVIST  GADOBUTROL  1 MMOL/ML IV SOLN COMPARISON:  MRI head December 03, 2023. FINDINGS: Brain: New punctate focus of restricted diffusion in the anterior right temporal lobe (series 5, image 28). Resolution of the previously seen small focus of restricted diffusion in the right corona radiata. Additional areas of masslike restricted diffusion and signal abnormality involving the right frontal lobe, genu of the corpus callosum extending into the left frontal lobe, and right periatrial region,  compatible with known tumor. More facet areas of T1 hyperintensity in the right frontal lobe right periatrial region are unchanged. No substantial mass effect or midline shift. No hydrocephalus. Similar cerebral atrophy. Vascular: Major arterial flow voids are maintained. Skull and upper cervical spine: Normal marrow signal. Sinuses/Orbits: Negative. IMPRESSION: 1. New punctate focus of restricted diffusion in the anterior right temporal lobe could represent a small acute infarct (favored) or new/progressive small site of tumor. 2. Otherwise, unchanged appearance of multifocal signal abnormality restricted diffusion in the right frontal and right temporal lobes and corpus callosum, compatible with known tumor. Electronically Signed   By: Gilmore GORMAN Molt M.D.   On: 12/09/2023 01:17   CT Head Wo Contrast Result Date: 12/08/2023 CLINICAL DATA:  Delirium altered EXAM: CT HEAD WITHOUT CONTRAST TECHNIQUE: Contiguous axial images were obtained from the base of the skull through the vertex without intravenous contrast. RADIATION DOSE REDUCTION: This exam was performed according to the departmental dose-optimization program which includes automated exposure control, adjustment of the mA and/or kV according to patient size and/or use of iterative reconstruction technique. COMPARISON:  MRI 12/03/2023, CT brain 09/09/2023 FINDINGS: Brain: Amorphous hyperdensity in the right frontal and temporal lobes, progressive compared with head CT from March and favored to represent mineralization over hemorrhage, and corresponding to history of known brain lesions. White matter hypodensity again visible in the right frontal and temporal lobes as well as right insula. Areas of cystic encephalomalacia as before. Grossly stable size and configuration of the ventricles with ex vacuo dilatation on the right. Vascular: No hyperdense vessels.  Carotid vascular calcification Skull: No fracture. Sinuses/Orbits: No acute finding. Mild mucosal  thickening in the sinuses Other: None IMPRESSION: Amorphous hyperdensity in the right frontal and temporal lobes, progressive compared with head CT from March and favored to represent mineralization over hemorrhage, and corresponding to history of known brain lesions. Other grossly stable areas of white matter hypodensity as demonstrated on recent MRI imaging. Stable ventricle size and morphology compared to prior. Electronically Signed   By: Luke Bun M.D.   On: 12/08/2023 21:58   DG Chest Portable 1 View Result Date: 12/08/2023 CLINICAL DATA:  Altered mental status. EXAM: PORTABLE CHEST 1 VIEW COMPARISON:  September 09, 2023 FINDINGS: The heart size and mediastinal contours are within normal limits. Both lungs are clear. The visualized skeletal structures are unremarkable. IMPRESSION: No active disease. Electronically Signed   By: Suzen Dials M.D.   On: 12/08/2023 21:30   MR BRAIN W WO CONTRAST Result Date: 12/03/2023 CLINICAL DATA:  Oligodendroglioma status post treatment, assess treatment response EXAM: MRI HEAD WITHOUT AND WITH CONTRAST TECHNIQUE: Multiplanar, multiecho pulse sequences of the brain and surrounding structures were obtained without and with intravenous contrast. CONTRAST:  7mL GADAVIST   GADOBUTROL  1 MMOL/ML IV SOLN COMPARISON:  MRI of the brain dated October 02, 2023. FINDINGS: Brain: There are new foci of increased diffusion signal present within the right corona radiata, insular ribbon and temporal cortex present on image 29 of series 5. There has been interval normalization of increase diffusion signal and contrast enhancement previously noted within the right cerebral peduncle. There continues to be increased diffusion signal within the right frontal lobe and genu of the corpus callosum. There is persistent amorphous area of increased T1 signal within the right frontal lobe, which appears similar to the prior study. There is moderate generalized cerebral volume loss, which is more  pronounced on the right. There is mild-to-moderate periventricular white matter disease, also more pronounced on the right. Vascular: Normal flow voids. Skull and upper cervical spine: Status post right frontal burr craniotomy. Normal bone marrow signal. Sinuses/Orbits: Mild mucosal disease within the ethmoid and maxillary sinuses. The orbits are unremarkable. Other: None. IMPRESSION: 1. New small foci of potential restricted diffusion within the right corona radiata, insular ribbon and temporal cortex. 2. Interval near resolution of restricted diffusion and contrast enhancement within the right cerebral peduncle. 3. Stable appearance of the patient's primary tumor site in the right frontal lobe. Electronically Signed   By: [name] M.D.   On: 12/03/2023 13:23    ASSESSMENT & PLAN:  Glioma Stroke  DE JAWORSKI presents with acute right subcortical infarct.  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  Previous small foci of DWI signal abnormality were of uncertain etiology; other DWI bright regions can be explained through necrosis or avastin  effect.  None of this is directly from tumor, which is otherwise stable and quiescent.  Strongly agree with transition to oral anticoagulation given failure of antiplatelet therapy.  He will rehab aggressively to regain function on left side.    Discussed case at length with wife, including overall goals of care.   All questions were answered. The patient knows to call the clinic with any problems, questions or concerns.  The total time spent in the encounter was 60 minutes and more than 50% was on counseling and review of test results     Arthea MARLA Manns, MD 12/21/2023 5:08 PM

## 2023-12-21 NOTE — Therapy (Signed)
 [facility] [address] [place], [place], [address] Phone: [phone]   Fax:  [phone]  Patient Details  Name: Alan Wise MRN: [id number] Date of Birth: 1971-04-11 Referring Provider:  No ref. provider found  Encounter Date: 12/21/2023  OCCUPATIONAL THERAPY DISCHARGE SUMMARY  Visits from Start of Care: 12  Current functional level related to goals / functional outcomes: Pt had not met all goals to satisfactory levels and was admitted to the hospital.  Pt had met 3/5 short term and 2/5 long term goals  Remaining deficits: Pt had several functional deficits in strength, coordination and visual regard on L side.   Education / Equipment: Pt had various materials and education to assist with self-management but continued OT services are recommended and already scheduled following DC from the hospital.   Patient discharged from outpatient occupational therapy at this time due to hospitalizaiton with resume orders to be used to re-evaluate pt .     Clarita LITTIE Pride, OT 12/21/2023, 10:02 AM

## 2023-12-21 NOTE — Progress Notes (Addendum)
 Inpatient Rehab Admissions Coordinator:  Insurance authorization started. Will continue to follow.   Updated wife and reviewed benefits.  Tinnie Yvone Cohens, MS, CCC-SLP Admissions Coordinator [phone]

## 2023-12-22 ENCOUNTER — Inpatient Hospital Stay (HOSPITAL_COMMUNITY)

## 2023-12-22 ENCOUNTER — Other Ambulatory Visit: Payer: Self-pay | Admitting: Internal Medicine

## 2023-12-22 DIAGNOSIS — G40909 Epilepsy, unspecified, not intractable, without status epilepticus: Secondary | ICD-10-CM | POA: Diagnosis not present

## 2023-12-22 DIAGNOSIS — E1169 Type 2 diabetes mellitus with other specified complication: Secondary | ICD-10-CM | POA: Diagnosis not present

## 2023-12-22 DIAGNOSIS — I10 Essential (primary) hypertension: Secondary | ICD-10-CM | POA: Diagnosis not present

## 2023-12-22 DIAGNOSIS — I639 Cerebral infarction, unspecified: Secondary | ICD-10-CM | POA: Diagnosis not present

## 2023-12-22 DIAGNOSIS — R531 Weakness: Secondary | ICD-10-CM

## 2023-12-22 MED ORDER — APIXABAN 5 MG PO TABS
5.0000 mg | ORAL_TABLET | Freq: Two times a day (BID) | ORAL | Status: DC
  Administered 2023-12-22 – 2023-12-24 (×5): 5 mg via ORAL
  Filled 2023-12-22 (×5): qty 1

## 2023-12-22 MED ORDER — ENSURE PLUS HIGH PROTEIN PO LIQD
237.0000 mL | Freq: Two times a day (BID) | ORAL | Status: DC
  Administered 2023-12-22 – 2023-12-24 (×4): 237 mL via ORAL

## 2023-12-22 MED ORDER — DEXTROSE IN LACTATED RINGERS 5 % IV SOLN: INTRAVENOUS | Status: DC

## 2023-12-22 NOTE — Progress Notes (Addendum)
 Progress Note   Patient: Alan Wise FMW:[id number] DOB: 08/18/1970 DOA: 12/18/2023     4 DOS: the patient was seen and examined on 12/22/2023   Brief hospital course: Alan Wise was admitted to the hospital with the working diagnosis of acute neurologic deficit, new cva with focal seizures.   53 y.o. male with medical history significant of T2DM, hyperlipidemia, GERD and low grade glioma who presented with acute focal neurologic deficit.  Recent hospitalization at [facility] 06/17 to 12/10/23 for altered mental status, work up was positive for anterior right temporal lobe punctate restricted diffusion per brain MRI. Neurology recommended not change in management.   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   2 days ago his wife found him on the floor, and helped him to stand back on his feet. The fall was not witnessed but apparently was severe. After the fall he was noted to have significant left sided weakness. He has been dragging his left leg while walking and needing constant help to keep his balance in order to prevent falling down.  On his initial physical examination his blood pressure was 143/82, RR 13 HR 63 and 02 saturation 100%  Neurology awake and alert, able to respond to simple question and follow simple commands. Mild confusion and not able to give detailed history, speech not slurred and cranial nerves 2 to 12 intact. Positive left upper and lower extremity proximal weakness, 4/5 left arm and 3/5 left leg.  Preserved hand grip.   Na 138, K 4,4 Cl 100, bicarbonate 29 glucose 105 bun 15 cr 1,14 AST 27 ALT 44  Wbc 6,9 hgb 16.2 plt 287  Alcohol < 15    CT head with stable encephalomalacia changes and dystrophic calcification within the right cerebral hemisphere, compatible with previously treated neoplasm. No adverse interval change.   Brain MRI with 3.2 x  1.3 focus of patchy restricted diffusion within the right basal ganglia, new from the recent prior brain MRI of 12/09/23. New punctate focus of restricted diffusion within the right caudate head.  Small focus of restricted diffusion demonstrated within the right temporal lobe on prior MRI is no longer present.   06/30 patient has been working with PT and OT, possible transfer to inpatient rehab.    EKG 56 bpm, left axis deviation, left anterior fascicular block, qtc 413, sinus rhythm with no significant ST segment or T wave changes.   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 DOAC when improved balance and decrease fall risk.    06/29 patient tolerating well increase dose of lamotrigine , still not fully recovered balance. Plan for CIR consultation  07/01 patient today more somnolent and less reactive, confused and mildly agitated. Not eating well. Re consulted neurology, patient will get stat CT head and repeat EEG   Assessment and Plan: Acute CVA (cerebrovascular accident) (HCC) Recurrent acute CVA.   Patient placed on apixaban  for anticoagulation  Discontinue aspirin .  Today with worsening mentation, less reactive and somnolent.  Re consulted Neurology, ordered stat head CT and will follow up on repeat EEG.   Eventual plan is transfer to CIR for therapy   Seizure disorder (HCC) Possible focal seizures.  Patient has been on lamotrigine , 150 mg am and 200 mg pm.   EEG with cortical dysfunction arising from right hemisphere, maximal right fronto temporal region likely secondary to underlying structural abnormality. No seizures or definite  epileptiform discharges.   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       Subjective: Patient had acute change in mentation, he has been more somnolent and lees reactive, decreased po intake   Physical Exam: Vitals:   12/22/23 0020 12/22/23 0405 12/22/23 0801 12/22/23 1219  BP: 115/68 (!) 106/56 127/80 (!) 137/91  Pulse: (!) 58 60 66 60  Resp: 18 18 17 16   Temp: 98 F (36.7 C) 97.7 F (36.5 C) 97.6 F (36.4 C) 97.7 F (36.5 C)  TempSrc:   Oral Oral  SpO2: 94% 95% 97% 95%  Weight:      Height:       Neurology somnolent and difficult to arouse, when awake not fully opening his eyes, moving all 4 extremities, and strength is preserved upper extremities, not following commands or responding to questions.  ENT with mild pallor Cardiovascular with S1 and S2 present and regular with no gallops or rubs Respiratory with no rales or wheezing, no rhonchi  Abdomen with no distention  No lower extremity edema  Data Reviewed:    Family Communication: I spoke with patient's wife at the bedside, we talked in detail about patient's condition, plan of care and prognosis and all questions were addressed.   Disposition: Status is: Inpatient Remains inpatient appropriate because: neuro workup   Planned Discharge Destination: Rehab    Author: Elidia Toribio Furnace, MD 12/22/2023 2:49 PM  For on call review [email or web address].

## 2023-12-22 NOTE — Progress Notes (Signed)
 LTM EEG hooked up and running - no initial skin breakdown - push button tested - [facility] monitoring.

## 2023-12-22 NOTE — Progress Notes (Signed)
 PHARMACY - ANTICOAGULATION CONSULT NOTE  Pharmacy Consult for apixaban  Indication: multifocal CVAs + concurrent cancer diagnosis   Allergies  Allergen Reactions   Contrast Media [Iodinated Contrast Media] Swelling   Patient Measurements: Height: 5' 8 (172.7 cm) Weight: 63.5 kg (140 lb) IBW/kg (Calculated) : 68.4 HEPARIN DW (KG): 63.5  Vital Signs: Temp: 97.7 F (36.5 C) (07/01 1219) Temp Source: Oral (07/01 1219) BP: 137/91 (07/01 1219) Pulse Rate: 60 (07/01 1219)  Labs: No results for input(s): HGB, HCT, PLT, APTT, LABPROT, INR, HEPARINUNFRC, HEPRLOWMOCWT, CREATININE, CKTOTAL, CKMB, TROPONINIHS in the last 72 hours.  Estimated Creatinine Clearance: 87.2 mL/min (by C-G formula based on SCr of 0.9 mg/dL).  Assessment: 53 y.o. male with hx of cryptogenic stroke, oligodendroglioma s/p radiation who is admitted for breakthrough seizures and strokes. Pharmacy consulted to resume apixaban .   Goal of Therapy:  Monitor platelets by anticoagulation protocol: Yes   Plan:  Apixaban  5mg  PO BID Continue to monitor H&H and platelets   Thank you for allowing pharmacy to be a part of this patient's care.  Shelba Collier, PharmD, BCPS Clinical Pharmacist

## 2023-12-22 NOTE — Plan of Care (Signed)
  Problem: Health Behavior/Discharge Planning: Goal: Ability to manage health-related needs will improve Outcome: Progressing Goal: Goals will be collaboratively established with patient/family Outcome: Progressing   Problem: Clinical Measurements: Goal: Ability to maintain clinical measurements within normal limits will improve Outcome: Progressing Goal: Will remain free from infection Outcome: Progressing Goal: Diagnostic test results will improve Outcome: Progressing Goal: Respiratory complications will improve Outcome: Progressing Goal: Cardiovascular complication will be avoided Outcome: Progressing   Problem: Nutrition: Goal: Adequate nutrition will be maintained Outcome: Progressing   Problem: Activity: Goal: Risk for activity intolerance will decrease Outcome: Progressing   Problem: Coping: Goal: Level of anxiety will decrease Outcome: Progressing   Problem: Safety: Goal: Ability to remain free from injury will improve Outcome: Progressing   Problem: Skin Integrity: Goal: Risk for impaired skin integrity will decrease Outcome: Progressing

## 2023-12-22 NOTE — Progress Notes (Signed)
 IP rehab admissions - We have received auth for acute inpatient rehab admission today.  I spoke with attending MD who says patient has hadneuro changes today and is not ready to admit to CIR today and will re-check for medical stability in the am.  [phone]

## 2023-12-22 NOTE — Progress Notes (Signed)
 NEUROLOGY CONSULT FOLLOW UP NOTE   Date of service: December 22, 2023 Patient Name: Alan Wise MRN:  [id number] DOB:  Jun 29, 1970  Interval Hx/subjective   Patient with worsening waxing and waning of baseline aphasia, left-sided weakness over the last couple of days. On exam, he was nonverbal for some time then was able to say some words with expressive aphasia but was unable to participate fully in conversation, unable to say wife's name or name simple objects.   Vitals   Vitals:   12/22/23 0405 12/22/23 0801 12/22/23 1219 12/22/23 1528  BP: (!) 106/56 127/80 (!) 137/91 (!) 148/89  Pulse: 60 66 60 (!) 56  Resp: 18 17 16 16   Temp: 97.7 F (36.5 C) 97.6 F (36.4 C) 97.7 F (36.5 C) 98.4 F (36.9 C)  TempSrc:  Oral Oral Oral  SpO2: 95% 97% 95% 94%  Weight:      Height:         Body mass index is 21.29 kg/m.  Physical Exam   Constitutional: Appears chronically ill Cardiovascular: S1S2, well perfused Respiratory: Effort normal, non-labored breathing.   Neurologic Examination   Neuro: Mental Status: Patient is awake, alert, oriented to person, place, month, year, and situation.*** Patient is able to give a clear and coherent history.*** No signs of aphasia or neglect*** Cranial Nerves: II: Visual Fields are full. Pupils are equal, round, and reactive to light.  *** III,IV, VI: EOMI without ptosis or diploplia.  V: Facial sensation is symmetric to temperature VII: Facial movement is symmetric.  VIII: hearing is intact to voice X: Uvula elevates symmetrically XI: Shoulder shrug is symmetric. XII: tongue is midline without atrophy or fasciculations.  Motor: Tone is normal. Bulk is normal. LUE: increased Sensory: Sensation is symmetric to light touch and temperature in the arms and legs.*** Deep Tendon Reflexes: 2+ and symmetric in the biceps and patellae. *** Plantars: Toes are downgoing bilaterally. *** Cerebellar: FNF and HKS are intact  bilaterally***   Medications  Current Facility-Administered Medications:    acetaminophen  (TYLENOL ) tablet 650 mg, 650 mg, Oral, Q6H PRN **OR** acetaminophen  (TYLENOL ) suppository 650 mg, 650 mg, Rectal, Q6H PRN, Arrien, Elidia Sieving, MD   amLODipine  (NORVASC ) tablet 5 mg, 5 mg, Oral, Daily, Arrien, Mauricio Daniel, MD, 5 mg at 12/22/23 9147   apixaban  (ELIQUIS ) tablet 5 mg, 5 mg, Oral, BID, Arrien, Mauricio Daniel, MD, 5 mg at 12/22/23 1217   atorvastatin  (LIPITOR) tablet 40 mg, 40 mg, Oral, Daily, Arrien, Mauricio Daniel, MD, 40 mg at 12/22/23 0851   dextrose  5 % in lactated ringers  infusion, , Intravenous, Continuous, Arrien, Elidia Sieving, MD, Last Rate: 50 mL/hr at 12/22/23 1529, New Bag at 12/22/23 1529   feeding supplement (ENSURE PLUS HIGH PROTEIN) liquid 237 mL, 237 mL, Oral, BID BM, Arrien, Mauricio Daniel, MD, 237 mL at 12/22/23 1054   lamoTRIgine  (LAMICTAL ) tablet 150 mg, 150 mg, Oral, Daily, 150 mg at 12/22/23 0851 **AND** lamoTRIgine  (LAMICTAL ) tablet 200 mg, 200 mg, Oral, QHS, Bhagat, Srishti L, MD, 200 mg at 12/21/23 2156   ondansetron  (ZOFRAN ) tablet 4 mg, 4 mg, Oral, Q6H PRN **OR** ondansetron  (ZOFRAN ) injection 4 mg, 4 mg, Intravenous, Q6H PRN, Arrien, Mauricio Daniel, MD   polyethylene glycol (MIRALAX  / GLYCOLAX ) packet 17 g, 17 g, Oral, Daily PRN, Arrien, Mauricio Daniel, MD   propranolol  (INDERAL ) tablet 20 mg, 20 mg, Oral, BID, Arrien, Mauricio Daniel, MD, 20 mg at 12/22/23 0852  Labs and Diagnostic Imaging   CBC:  Recent Labs  Lab 12/18/23  1440 12/19/23 0523  WBC 6.9 6.9  NEUTROABS 4.7  --   HGB 16.2 14.7  HCT 47.8 43.5  MCV 87.7 86.0  PLT 287 247    Basic Metabolic Panel:  Lab Results  Component Value Date   NA 140 12/19/2023   K 3.9 12/19/2023   CO2 27 12/19/2023   GLUCOSE 75 12/19/2023   BUN 13 12/19/2023   CREATININE 0.90 12/19/2023   CALCIUM  9.3 12/19/2023   GFRNONAA >60 12/19/2023   GFRAA 123 12/05/2020   Lipid Panel:  Lab Results   Component Value Date   LDLCALC 129 (H) 12/19/2023   HgbA1c:  Lab Results  Component Value Date   HGBA1C 5.4 09/16/2023   Urine Drug Screen:     Component Value Date/Time   LABOPIA NONE DETECTED 12/19/2023 0655   COCAINSCRNUR NONE DETECTED 12/19/2023 0655   COCAINSCRNUR NONE DETECTED 12/09/2023 0025   LABBENZ NONE DETECTED 12/19/2023 0655   AMPHETMU NONE DETECTED 12/19/2023 0655   THCU NONE DETECTED 12/19/2023 0655   LABBARB NONE DETECTED 12/19/2023 0655    Alcohol Level     Component Value Date/Time   ETH <15 12/18/2023 1418   INR  Lab Results  Component Value Date   INR 1.0 12/18/2023   APTT  Lab Results  Component Value Date   APTT 34 12/18/2023   AED levels:  Lab Results  Component Value Date   LAMOTRIGINE  10.8 09/14/2023    CT Head without contrast(Personally reviewed): PENDING  MR Angio head without contrast (Personally reviewed): PENDING  MRI Brain(Personally reviewed): PENDING   Assessment   Alan Wise is a 53 y.o. 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   He was recently started on Abilify  due to agitation but this was discontinued after 5 days due to somnolence. He is on lamotrigine  for seizures. The left-sided weakness was discovered 6/26, right after this was stopped. Noted previously was complicated medication adherence due to patient's intermittent medication refusal. He was supposed to be on lamotrigine  100 in the morning, 200 at night after recent breakthrough seizures but because of his reluctance to take medications / hesitancy with medication changes he had been getting only 150 twice a day. This was increased to 150mg  in AM and 200mg  HS by neurology consult 6/27. Eliquis  was held previously due to abnormal gait and concern  for falls, this was started today inpatient.   On exam today, he has left gaze preference, left hemainopia, left facial droop, waxing and waning expressive aphasia with left arm and leg weakness. Denied headache. Wife states that this has been ongoing for several days but has been worse after his fall.     Recommendations   - CTH,  - Agree with MRI Brain  - Will add MRA Head  - continue lamotrigine  - LTM EEG - Seizure Precautions ______________________________________________________________________   Signed, Rocky JAYSON Likes, NP [facility]

## 2023-12-22 NOTE — Progress Notes (Signed)
 Physical Therapy Treatment Patient Details Name: Alan Wise MRN: [id number] DOB: [birth year] Today's Date: 12/22/2023   History of Present Illness 53 y.o. male presents to [facility] 12/18/23 after falling two days prior with residual L sided weakness and somnolence. Brain MRI showed new patchy restricted diffusion in R basal ganglia, new punctuate focus of restricted diffusion in R caudate head. Recent admit 6/17-6/19 with anterior R temporal lobe punctate restricted diffusion. PMHx: T2DM, GERD, CVA w/ L sided weakness, oligodendroglioma    PT Comments  Pt received in supine and agreeable to session. Pt appears more lethargic with low volume with verbalizations at the beginning of the session and pt's wife expressing concern over increased weakness and facial droop today. Pt demonstrates slower, more unsteady gait this session requiring min A for support. Pt able to use the bathroom and requires assist for pericare. Noted increased tone in L arm compared to previous session and some L inattention, but pt uses it some functionally such as reaching down to pull up his pants. Pt able to stand at the sink to wash hands and brush teeth, but requires cues to turn head to locate objects on L side due to field cut. Pt continues to benefit from PT services to progress toward functional mobility goals.    If plan is discharge home, recommend the following: Assistance with cooking/housework;Assist for transportation;Help with stairs or ramp for entrance;A lot of help with walking and/or transfers;A lot of help with bathing/dressing/bathroom;Direct supervision/assist for medications management;Direct supervision/assist for financial management   Can travel by private vehicle        Equipment Recommendations  None recommended by PT    Recommendations for Other Services       Precautions / Restrictions Precautions Precautions: Fall Recall of Precautions/Restrictions: Impaired Precaution/Restrictions  Comments: L visual field cut at baseline Restrictions Weight Bearing Restrictions Per Provider Order: No     Mobility  Bed Mobility Overal bed mobility: Needs Assistance Bed Mobility: Supine to Sit, Sit to Supine     Supine to sit: Min assist, Used rails, HOB elevated Sit to supine: Contact guard assist, HOB elevated   General bed mobility comments: increased time and min A for trunk elevation. Cues to elevate LLE into bed due to some L inattention    Transfers Overall transfer level: Needs assistance Equipment used: None Transfers: Sit to/from Stand Sit to Stand: Min assist, Contact guard assist           General transfer comment: min A from EOB and CGA from low toilet seat with cues for hand placement    Ambulation/Gait Ambulation/Gait assistance: Min assist Gait Distance (Feet): 10 Feet (x2)   Gait Pattern/deviations: Step-through pattern, Decreased stride length, Shuffle, Trunk flexed, Drifts right/left       General Gait Details: Slow, unsteady gait with pt requiring RUE support via HHA or pt reaching to environment. Short, shuffled steps that slightly improved with second trial   Stairs             Wheelchair Mobility     Tilt Bed    Modified Rankin (Stroke Patients Only) Modified Rankin (Stroke Patients Only) Pre-Morbid Rankin Score: Moderate disability Modified Rankin: Moderately severe disability     Balance Overall balance assessment: Needs assistance, History of Falls Sitting-balance support: Feet supported Sitting balance-Leahy Scale: Fair Sitting balance - Comments: sitting EOB   Standing balance support: No upper extremity supported, During functional activity, Single extremity supported Standing balance-Leahy Scale: Fair Standing balance comment: CGA-min A without AD  Communication Communication Communication: Impaired Factors Affecting Communication: Difficulty expressing self;Reduced  clarity of speech  Cognition Arousal: Lethargic Behavior During Therapy: Flat affect   PT - Cognitive impairments: Memory, Attention, Sequencing, Problem solving, Orientation                       PT - Cognition Comments: reduced verbalizations with low volume at beginning of session that improved with increased alertness at end of session Following commands: Intact      Cueing Cueing Techniques: Verbal cues, Tactile cues, Visual cues  Exercises      General Comments General comments (skin integrity, edema, etc.): Spouse present during session and expressing concerns over increased weakness and facial droop today      Pertinent Vitals/Pain Pain Assessment Pain Assessment: Faces Faces Pain Scale: Hurts a little bit Pain Location: L shoulder with mobility Pain Descriptors / Indicators: Discomfort Pain Intervention(s): Limited activity within patient's tolerance, Monitored during session     PT Goals (current goals can now be found in the care plan section) Acute Rehab PT Goals Patient Stated Goal: to get more rehab PT Goal Formulation: With patient/family Time For Goal Achievement: 01/02/24 Progress towards PT goals: Progressing toward goals    Frequency    Min 3X/week       AM-PAC PT 6 Clicks Mobility   Outcome Measure  Help needed turning from your back to your side while in a flat bed without using bedrails?: A Little Help needed moving from lying on your back to sitting on the side of a flat bed without using bedrails?: A Little Help needed moving to and from a bed to a chair (including a wheelchair)?: A Little Help needed standing up from a chair using your arms (e.g., wheelchair or bedside chair)?: A Little Help needed to walk in hospital room?: A Little Help needed climbing 3-5 steps with a railing? : A Little 6 Click Score: 18    End of Session Equipment Utilized During Treatment: Gait belt Activity Tolerance: Patient tolerated treatment  well;Patient limited by fatigue Patient left: in bed;with call bell/phone within reach;with family/visitor present;with bed alarm set Nurse Communication: Mobility status PT Visit Diagnosis: Unsteadiness on feet (R26.81);Other abnormalities of gait and mobility (R26.89);Muscle weakness (generalized) (M62.81)     Time: 8474-8397 PT Time Calculation (min) (ACUTE ONLY): 37 min  Charges:    $Gait Training: 8-22 mins $Therapeutic Activity: 8-22 mins PT General Charges $$ ACUTE PT VISIT: 1 Visit                     Darryle George, PTA Acute Rehabilitation Services Secure Chat Preferred  [phone]    Darryle George 12/22/2023, 4:31 PM

## 2023-12-22 NOTE — Progress Notes (Signed)
 IP rehab admissions - Continue to await insurance determination regarding possible CIR admission.  Will update all once I hear back from insurance carrier.  [phone]

## 2023-12-22 NOTE — Plan of Care (Signed)
   Problem: Education: Goal: Knowledge of disease or condition will improve Outcome: Progressing

## 2023-12-23 ENCOUNTER — Inpatient Hospital Stay (HOSPITAL_COMMUNITY)

## 2023-12-23 ENCOUNTER — Encounter: Admitting: Speech Pathology

## 2023-12-23 ENCOUNTER — Ambulatory Visit

## 2023-12-23 DIAGNOSIS — I63529 Cerebral infarction due to unspecified occlusion or stenosis of unspecified anterior cerebral artery: Secondary | ICD-10-CM

## 2023-12-23 DIAGNOSIS — G40909 Epilepsy, unspecified, not intractable, without status epilepticus: Secondary | ICD-10-CM | POA: Diagnosis not present

## 2023-12-23 DIAGNOSIS — R1314 Dysphagia, pharyngoesophageal phase: Secondary | ICD-10-CM | POA: Diagnosis not present

## 2023-12-23 DIAGNOSIS — I639 Cerebral infarction, unspecified: Secondary | ICD-10-CM | POA: Diagnosis not present

## 2023-12-23 DIAGNOSIS — R569 Unspecified convulsions: Secondary | ICD-10-CM | POA: Diagnosis not present

## 2023-12-23 LAB — COMPREHENSIVE METABOLIC PANEL WITH GFR
ALT: 32 U/L (ref 0–44)
AST: 20 U/L (ref 15–41)
Albumin: 3.6 g/dL (ref 3.5–5.0)
Alkaline Phosphatase: 56 U/L (ref 38–126)
Anion gap: 9 (ref 5–15)
BUN: 11 mg/dL (ref 6–20)
CO2: 26 mmol/L (ref 22–32)
Calcium: 9.4 mg/dL (ref 8.9–10.3)
Chloride: 102 mmol/L (ref 98–111)
Creatinine, Ser: 0.87 mg/dL (ref 0.61–1.24)
GFR, Estimated: >60 mL/min (ref >60)
Glucose, Bld: 117 mg/dL — ABNORMAL HIGH (ref 70–99)
Potassium: 4.1 mmol/L (ref 3.5–5.1)
Sodium: 137 mmol/L (ref 135–145)
Total Bilirubin: 0.6 mg/dL (ref 0.0–1.2)
Total Protein: 6.5 g/dL (ref 6.5–8.1)

## 2023-12-23 NOTE — Progress Notes (Signed)
 NEUROLOGY CONSULT FOLLOW UP NOTE   Date of service: December 23, 2023 Patient Name: Alan Wise MRN:  [id number] DOB:  Nov 11, 1970  Interval Hx/subjective   Patient with worsening waxing and waning of baseline aphasia, left-sided weakness over the last couple of days, MRA was performed overnight, but no MRI.  Vitals   Vitals:   12/22/23 1528 12/22/23 2000 12/23/23 0030 12/23/23 0453  BP: (!) 148/89 138/78 128/78 116/79  Pulse: (!) 56 63 (!) 58 (!) 57  Resp: 16 18 16 18   Temp: 98.4 F (36.9 C) 97.9 F (36.6 C) 98 F (36.7 C) 98.7 F (37.1 C)  TempSrc: Oral Oral Oral Oral  SpO2: 94% 94% 94% 96%  Weight:      Height:         Body mass index is 21.29 kg/m.  Physical Exam   Constitutional: Appears chronically ill Cardiovascular: S1S2, well perfused Respiratory: Effort normal, non-labored breathing.   Neurologic Examination    Neuro: MS: Awake, densely aphasic, only fragmentary expression but is able to follow commands CN: Able to cross midline in both directions, left hemianopia Motor: He has a spastic left hemiparesis and when I ask him to lift his left leg he does not do so voluntarily, but does when I stimulated.   Medications  Current Facility-Administered Medications:    acetaminophen  (TYLENOL ) tablet 650 mg, 650 mg, Oral, Q6H PRN **OR** acetaminophen  (TYLENOL ) suppository 650 mg, 650 mg, Rectal, Q6H PRN, Arrien, Elidia Sieving, MD   amLODipine  (NORVASC ) tablet 5 mg, 5 mg, Oral, Daily, Arrien, Mauricio Daniel, MD, 5 mg at 12/22/23 9147   apixaban  (ELIQUIS ) tablet 5 mg, 5 mg, Oral, BID, Arrien, Mauricio Daniel, MD, 5 mg at 12/22/23 2308   atorvastatin  (LIPITOR) tablet 40 mg, 40 mg, Oral, Daily, Arrien, Elidia Sieving, MD, 40 mg at 12/22/23 9148   dextrose  5 % in lactated ringers  infusion, , Intravenous, Continuous, Arrien, Elidia Sieving, MD, Last Rate: 50 mL/hr at 12/22/23 2007, Restarted at 12/22/23 2007   feeding supplement (ENSURE PLUS HIGH PROTEIN) liquid  237 mL, 237 mL, Oral, BID BM, Arrien, Elidia Sieving, MD, 237 mL at 12/22/23 1054   lamoTRIgine  (LAMICTAL ) tablet 150 mg, 150 mg, Oral, Daily, 150 mg at 12/22/23 0851 **AND** lamoTRIgine  (LAMICTAL ) tablet 200 mg, 200 mg, Oral, QHS, Bhagat, Srishti L, MD, 200 mg at 12/22/23 2308   ondansetron  (ZOFRAN ) tablet 4 mg, 4 mg, Oral, Q6H PRN **OR** ondansetron  (ZOFRAN ) injection 4 mg, 4 mg, Intravenous, Q6H PRN, Arrien, Elidia Sieving, MD   polyethylene glycol (MIRALAX  / GLYCOLAX ) packet 17 g, 17 g, Oral, Daily PRN, Arrien, Elidia Sieving, MD   propranolol  (INDERAL ) tablet 20 mg, 20 mg, Oral, BID, Arrien, Elidia Sieving, MD, 20 mg at 12/22/23 2307  Labs and Diagnostic Imaging   CBC:  Recent Labs  Lab 12/18/23 1440 12/19/23 0523  WBC 6.9 6.9  NEUTROABS 4.7  --   HGB 16.2 14.7  HCT 47.8 43.5  MCV 87.7 86.0  PLT 287 247    Basic Metabolic Panel:  Lab Results  Component Value Date   NA 137 12/23/2023   K 4.1 12/23/2023   CO2 26 12/23/2023   GLUCOSE 117 (H) 12/23/2023   BUN 11 12/23/2023   CREATININE 0.87 12/23/2023   CALCIUM  9.4 12/23/2023   GFRNONAA >60 12/23/2023   GFRAA 123 12/05/2020   Lipid Panel:  Lab Results  Component Value Date   LDLCALC 129 (H) 12/19/2023   HgbA1c:  Lab Results  Component Value Date   HGBA1C  5.4 09/16/2023   Urine Drug Screen:     Component Value Date/Time   LABOPIA NONE DETECTED 12/19/2023 0655   COCAINSCRNUR NONE DETECTED 12/19/2023 0655   COCAINSCRNUR NONE DETECTED 12/09/2023 0025   LABBENZ NONE DETECTED 12/19/2023 0655   AMPHETMU NONE DETECTED 12/19/2023 0655   THCU NONE DETECTED 12/19/2023 0655   LABBARB NONE DETECTED 12/19/2023 0655    Alcohol Level     Component Value Date/Time   ETH <15 12/18/2023 1418   INR  Lab Results  Component Value Date   INR 1.0 12/18/2023   APTT  Lab Results  Component Value Date   APTT 34 12/18/2023   AED levels:  Lab Results  Component Value Date   LAMOTRIGINE  10.8 09/14/2023    CT Head  without contrast(Personally reviewed): No evidence of hemorrhage    Assessment   Alan Wise is a 53 y.o. 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   He was recently started on Abilify  due to agitation but this was discontinued after 5 days due to somnolence. He is on lamotrigine  for seizures. The left-sided weakness was discovered 6/26, right after this was stopped. Noted previously was complicated medication adherence due to patient's intermittent medication refusal. He was supposed to be on lamotrigine  100 in the morning, 200 at night after recent breakthrough seizures but because of his reluctance to take medications / hesitancy with medication changes he had been getting only 150 twice a day. This was increased to 150mg  in AM and 200mg  HS by neurology consult 6/27. Eliquis  was held previously due to abnormal gait and concern for falls, this was started inpatient.   EEG has been negative for seizures and I think we can discontinue the LTM EEG at this point.  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  Unfortunately the MRI portion was not completed and he will need this today.  Recommendations   MRI brain Continue current dose of lamotrigine  Continue Eliquis  Neurology will follow ______________________________________________________________________    Aisha Seals, MD [facility]   If 7pm- 7am, please page neurology on call as listed in [facility].

## 2023-12-23 NOTE — Discharge Instructions (Addendum)

## 2023-12-23 NOTE — Progress Notes (Signed)
 Progress Note    Alan Wise   FMW:[id number]  DOB: 1971-02-04  DOA: 12/18/2023     5 PCP: Antonette Angeline ORN, NP  Initial CC: AMS, left weakness  Hospital Course: 53 y.o. male with medical history significant of T2DM, hyperlipidemia, GERD and low grade glioma who presented with AMS initially to [facility].  Recent hospitalization at [facility] 06/17 to 12/10/23 for altered mental status, work up was positive for anterior right temporal lobe punctate restricted diffusion per brain MRI.  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  Prior to admission, he was found on the floor and had difficulty standing back on his feet.  He also had ongoing left-sided weakness and intermittent confusion. He was admitted for ongoing workup.   CT head with stable encephalomalacia changes and dystrophic calcification within the right cerebral hemisphere, compatible with previously treated neoplasm. No adverse interval change.   Brain MRI with 3.2 x 1.3 focus of patchy restricted diffusion within the right basal ganglia, new from the recent prior brain MRI of 12/09/23. New punctate focus of restricted diffusion within the right caudate head. Small focus of restricted diffusion demonstrated within the right temporal lobe on prior MRI is no longer present.   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 DOAC when improved balance and decrease fall risk.   06/29 patient tolerating well increase dose of lamotrigine , still not fully recovered balance. 07/01 patient more somnolent and less reactive, confused and mildly agitated. Not eating well. Re consulted neurology, patient will get repeat EEG and more imaging.     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   Interval History:  Wife present bedside this morning.  Patient seems to be improving from mentation compared to yesterday.  Does have dense expressive aphasia (about baseline) and also more difficulty with dysphagia now.  EEG concluding this morning.    Old records reviewed in assessment of this patient  Antimicrobials:   DVT prophylaxis:  SCDs Start: 12/18/23 1736 apixaban  (ELIQUIS ) tablet 5 mg   Code Status:   Code Status: Full Code  Mobility Assessment (Last 72 Hours)     Mobility Assessment  Row Name 12/23/23 0800 12/23/23 0600 12/23/23 0209 12/22/23 2008 12/22/23 2000   Does patient have an order for bedrest or is patient medically unstable No - Continue assessment No - Continue assessment No - Continue assessment  No - Continue assessment No - Continue assessment   What is the highest level of mobility based on the progressive mobility assessment? Level 5 (Walks with assist in room/hall) - Balance while stepping forward/back and can walk in room with assist - Complete Level 5 (Walks with assist in room/hall) - Balance while stepping forward/back and can walk in room with assist - Complete Level 5 (Walks with assist in room/hall) - Balance while stepping forward/back and can walk in room with assist - Complete Level 5 (Walks with assist in room/hall) - Balance while stepping forward/back and can walk in room with assist - Complete Level 5 (Walks with assist in room/hall) - Balance while stepping forward/back and can walk in room with assist - Complete    Row Name 12/22/23 1631 12/22/23 1600 12/22/23 1200 12/22/23 0800 12/22/23 0600   Does patient have an order for bedrest or is patient medically unstable -- No - Continue assessment No - Continue assessment No - Continue assessment No - Continue assessment   What is the highest level of mobility based on the progressive mobility assessment? Level 5 (Walks with assist in room/hall) - Balance while stepping forward/back and can walk in room with assist - Complete Level 5 (Walks with assist in room/hall) - Balance while stepping forward/back and can walk in room with assist - Complete Level 5 (Walks with assist in room/hall) - Balance while stepping forward/back and can walk in room with assist - Complete Level 5 (Walks with assist in room/hall) - Balance while stepping forward/back and can walk in room with assist - Complete Level 5 (Walks with assist in room/hall) - Balance while stepping forward/back and can walk in room with assist - Complete    Row Name 12/22/23 0400 12/22/23 0000 12/21/23 2207 12/21/23 2000 12/21/23 1048   Does patient have an order for bedrest or is patient medically unstable No - Continue assessment No - Continue assessment No - Continue assessment  No - Continue assessment --   What is the highest level of mobility based on the progressive mobility assessment? Level 5 (Walks with assist in room/hall) - Balance while stepping forward/back and can walk in room with assist - Complete Level 5 (Walks with assist in room/hall) - Balance while stepping forward/back and can walk in room with assist - Complete Level 5 (Walks with assist in room/hall) - Balance while stepping forward/back and can walk in room with assist - Complete Level 5 (Walks with assist in room/hall) - Balance while stepping forward/back and can walk in room with assist - Complete Level 5 (Walks with assist in room/hall) - Balance while stepping forward/back and can walk in room with assist - Complete    Row Name 12/21/23 0945 12/20/23 2000 12/20/23 1812       Does patient have an order for bedrest or is patient medically unstable No - Continue assessment No - Continue assessment No - Continue assessment     What is the highest level of mobility based on the progressive mobility assessment? Level 4 (Walks with assist in room) - Balance while marching in place and cannot step forward and back - Complete Level 4 (Walks with assist in room) - Balance while marching in place and cannot step forward and back - Complete  Level 4 (Walks with assist in room) - Balance while marching in place and cannot step forward and back - Complete        Barriers to discharge: none Disposition Plan:  TBD HH orders placed: n/a Status is: Inpt  Objective: Blood pressure 130/76, pulse (!) 59, temperature (!) 97.5 F (36.4 C), resp. rate 19, height 5' 8 (1.727 m), weight 63.5 kg, SpO2 93%.  Examination:  Physical Exam Constitutional:      General: He is not in acute distress.    Comments: Obvious expressive aphasia noted.  Follows commands easily  HENT:     Head: Normocephalic and atraumatic.     Mouth/Throat:     Mouth: Mucous membranes are moist.  Eyes:     Extraocular Movements: Extraocular  movements intact.  Cardiovascular:     Rate and Rhythm: Normal rate and regular rhythm.  Pulmonary:     Effort: Pulmonary effort is normal. No respiratory distress.     Breath sounds: Normal breath sounds. No wheezing.  Abdominal:     General: Bowel sounds are normal. There is no distension.     Palpations: Abdomen is soft.     Tenderness: There is no abdominal tenderness.  Musculoskeletal:        General: Normal range of motion.     Cervical back: Normal range of motion and neck supple.  Skin:    General: Skin is warm and dry.  Neurological:     Comments: Severe expressive aphasia (baseline).  Follows commands and moves all 4 extremities.  Weaker on left upper and lower extremity (4/5)  Psychiatric:        Behavior: Behavior normal.      Consultants:  Neurology  Procedures:    Data Reviewed: Results for orders placed or performed during the hospital encounter of 12/18/23 (from the past 24 hours)  Comprehensive metabolic panel with GFR     Status: Abnormal   Collection Time: 12/23/23  4:48 AM  Result Value Ref Range   Sodium 137 135 - 145 mmol/L   Potassium 4.1 3.5 - 5.1 mmol/L   Chloride 102 98 - 111 mmol/L   CO2 26 22 - 32 mmol/L   Glucose, Bld 117 (H) 70 - 99 mg/dL   BUN 11 6 - 20 mg/dL   Creatinine, Ser 9.12 0.61 - 1.24 mg/dL   Calcium  9.4 8.9 - 10.3 mg/dL   Total Protein 6.5 6.5 - 8.1 g/dL   Albumin 3.6 3.5 - 5.0 g/dL   AST 20 15 - 41 U/L   ALT 32 0 - 44 U/L   Alkaline Phosphatase 56 38 - 126 U/L   Total Bilirubin 0.6 0.0 - 1.2 mg/dL   GFR, Estimated >39 >39 mL/min   Anion gap 9 5 - 15    I have reviewed pertinent nursing notes, vitals, labs, and images as necessary. I have ordered labwork to follow up on as indicated.  I have reviewed the last notes from staff over past 24 hours. I have discussed patient's care plan and test results with nursing staff, CM/SW, and other staff as appropriate.  Time spent: Greater than 50% of the 55 minute visit was spent in  counseling/coordination of care for the patient as laid out in the A&P.   LOS: 5 days   Alm Apo, MD [facility] 12/23/2023, 1:43 PM

## 2023-12-23 NOTE — Progress Notes (Signed)
 Occupational Therapy Treatment Patient Details Name: Alan Wise MRN: [id number] DOB: 1970-07-24 Today's Date: 12/23/2023   History of present illness 53 y.o. male [facility] to [facility] 12/18/23 after falling two days prior with residual L sided weakness and somnolence. Brain MRI showed new patchy restricted diffusion in R basal ganglia, new punctuate focus of restricted diffusion in R caudate head. Recent admit 6/17-6/19 with anterior R temporal lobe punctate restricted diffusion. PMHx: T2DM, GERD, CVA w/ L sided weakness, oligodendroglioma   OT comments  Pt developing more flexor tone of his LUE, noted to be resting more with L elbow in a flexed posture and hand contracting. Worked with pt on body on arm movements and manipulation techniques to reduce LUE tone, by end of session we were able to have pt digits and L elbow in an extended position. His scapula (L) was also very tight so OT performed scapular mobilizations to promote it gliding along the joint space. Messaged MD about muscle relaxers to help reduce tone. OT will look to procure a splint if muscle relaxers do not assist. OT to continue to progress pt as able. Patient has the potential to reach Mod I and demos the ability to tolerate 3 hours of therapy. Pt would benefit from an intensive rehab program to help maximize functional independence.       If plan is discharge home, recommend the following:  A little help with walking and/or transfers;A little help with bathing/dressing/bathroom;Help with stairs or ramp for entrance;Assistance with cooking/housework;Direct supervision/assist for medications management;Supervision due to cognitive status;Direct supervision/assist for financial management   Equipment Recommendations  None recommended by OT    Recommendations for Other Services      Precautions / Restrictions Precautions Precautions: Fall Recall of Precautions/Restrictions: Impaired Precaution/Restrictions Comments: L visual  field cut at baseline Restrictions Weight Bearing Restrictions Per Provider Order: No       Mobility Bed Mobility Overal bed mobility: Needs Assistance Bed Mobility: Supine to Sit, Sit to Supine     Supine to sit: Contact guard, Used rails Sit to supine: Min assist, Used rails        Transfers Overall transfer level: Needs assistance Equipment used: 1 person hand held assist Transfers: Sit to/from Stand Sit to Stand: Contact guard assist                 Balance Overall balance assessment: Needs assistance, History of Falls Sitting-balance support: Feet supported Sitting balance-Leahy Scale: Fair Sitting balance - Comments: sitting EOB   Standing balance support: No upper extremity supported, During functional activity, Single extremity supported Standing balance-Leahy Scale: Poor                             ADL either performed or assessed with clinical judgement   ADL       Grooming: Sitting;Wash/dry hands;Contact guard assist                   Toilet Transfer: Minimal assistance;Ambulation Toilet Transfer Details (indicate cue type and reason): using toilet like urinal         Functional mobility during ADLs: Minimal assistance (HHA) General ADL Comments: Focused session on addressing tone with mulitiple manual techniques, explained to pt what tone is and how it is restricing him. also worked no the scapula.    Extremity/Trunk Assessment Upper Extremity Assessment LUE Deficits / Details: LUE with development of strong flexor tone, Lt hand contracting into flexion and scapula  noted to not glide fluidily AROM shoulder < 50 deg which is 20 less compared to his IE. pain with AAROM/PROM of the shoulder. 2/4MAS elbow flexors LUE: Shoulder pain with ROM            Vision       Perception     Praxis     Communication Communication Communication: Impaired Factors Affecting Communication: Difficulty expressing self;Reduced clarity  of speech   Cognition Arousal: Alert Behavior During Therapy: Flat affect Cognition: Difficult to assess Difficult to assess due to: Impaired communication (expressive aphasia.)                             Following commands: Intact        Cueing   Cueing Techniques: Verbal cues, Tactile cues, Visual cues  Exercises Other Exercises Other Exercises: L lateral elbow prop for WB x5 reps. Other Exercises: LUE crossbody lotion RLE (Body on ARM LUE tasks) Other Exercises: L hand thumb abduction with IP ext, progressive ext of digits 2-5 Other Exercises: Pt in sidelying, scapular mobilization (retraction,protraction, shld flexion) LUE    Shoulder Instructions       General Comments spouse present during session and very supportive.    Pertinent Vitals/ Pain       Pain Assessment Pain Assessment: Faces Faces Pain Scale: Hurts a little bit Pain Location: L shoulder with mobility Pain Descriptors / Indicators: Discomfort, Aching Pain Intervention(s): Limited activity within patient's tolerance, Monitored during session, Other (comment) (ROM for stiffness)  Home Living                                          Prior Functioning/Environment              Frequency  Min 2X/week        Progress Toward Goals  OT Goals(current goals can now be found in the care plan section)  Progress towards OT goals: Progressing toward goals  Acute Rehab OT Goals Patient Stated Goal: To get better from here OT Goal Formulation: With patient/family Time For Goal Achievement: 01/02/24 Potential to Achieve Goals: Good  Plan      Co-evaluation                 AM-PAC OT 6 Clicks Daily Activity     Outcome Measure   Help from another person eating meals?: A Little Help from another person taking care of personal grooming?: A Little Help from another person toileting, which includes using toliet, bedpan, or urinal?: A Little Help from another  person bathing (including washing, rinsing, drying)?: A Little Help from another person to put on and taking off regular upper body clothing?: A Lot Help from another person to put on and taking off regular lower body clothing?: A Lot 6 Click Score: 16    End of Session Equipment Utilized During Treatment: Gait belt  OT Visit Diagnosis: Other abnormalities of gait and mobility (R26.89);Unsteadiness on feet (R26.81);Cognitive communication deficit (R41.841);Muscle weakness (generalized) (M62.81) Symptoms and signs involving cognitive functions: Cerebral infarction   Activity Tolerance Patient tolerated treatment well   Patient Left in bed;with call bell/phone within reach;with bed alarm set   Nurse Communication Mobility status        Time: 1329-1410 OT Time Calculation (min): 41 min  Charges: OT General Charges $OT Visit: 1 Visit OT  Treatments $Self Care/Home Management : 8-22 mins $Therapeutic Activity: 8-22 mins $Therapeutic Exercise: 8-22 mins  12/23/2023  AB, OTR/L  Acute Rehabilitation Services  Office: [phone]   Curtistine JONETTA Das 12/23/2023, 5:30 PM

## 2023-12-23 NOTE — Progress Notes (Signed)
 Speech Language Pathology Treatment: Cognitive-Linguistic  Patient Details Name: Alan Wise MRN: [id number] DOB: July 07, 1970 Today's Date: 12/23/2023 Time: 8994-8965 SLP Time Calculation (min) (ACUTE ONLY): 29 min  Assessment / Plan / Recommendation Clinical Impression  Pt seen for SLP session to address expressive and receptive language goals. He had some improvement in clarity of automatic, spontaneous responses and stated Relatively well considering when asked how he was doing today, which is an improvement compared to his typical perseverative responses of unfortunately then this or than that. He was able to identify objects f=3 with 100% accuracy and f=4 with >85% accuracy, though improved to 100% when objects moved closer together in his visual field. He was able to repeat objects with ~80% accuracy given phonemic cues or phrase completion cues - also a big improvement compared to prior session. He is able to state first and last name x4-5 given lead in introduction, I am Ally and you are___ with handshake + phonemic cue ja. SLP reviewed language tx app with pt and wife. Visual field cut may be a challenge for using apps.   At end of session, wife expressed swallowing concerns with solids. SLP will request swallow order per physician and follow up. Continue SLP PoC.    HPI HPI: 53 y.o. male [facility] 12/18/23 after falling two days prior with residual L sided weakness and somnolence. Brain MRI showed new patchy restricted diffusion in R basal ganglia, new punctuate focus of restricted diffusion in R caudate head. Recent admit 6/17-6/19 with anterior R temporal lobe punctate restricted diffusion. PMHx: T2DM, GERD, CVA w/ L sided weakness, oligodendroglioma      SLP Plan  Continue with current plan of care          Recommendations                      Frequent or constant Supervision/Assistance Aphasia (R47.01);Cognitive communication deficit (M58.158)      Continue with current plan of care     [name]  12/23/2023, 11:57 AM

## 2023-12-23 NOTE — Progress Notes (Signed)
 EEG maint complete. No SBD at [facility]Z F7 F4

## 2023-12-23 NOTE — Plan of Care (Signed)
  Problem: Self-Care: Goal: Ability to participate in self-care as condition permits will improve Outcome: Progressing Goal: Verbalization of feelings and concerns over difficulty with self-care will improve Outcome: Progressing Goal: Ability to communicate needs accurately will improve Outcome: Progressing   Problem: Clinical Measurements: Goal: Ability to maintain clinical measurements within normal limits will improve Outcome: Progressing Goal: Will remain free from infection Outcome: Progressing Goal: Diagnostic test results will improve Outcome: Progressing Goal: Respiratory complications will improve Outcome: Progressing Goal: Cardiovascular complication will be avoided Outcome: Progressing   Problem: Activity: Goal: Risk for activity intolerance will decrease Outcome: Progressing   Problem: Elimination: Goal: Will not experience complications related to bowel motility Outcome: Progressing Goal: Will not experience complications related to urinary retention Outcome: Progressing   Problem: Pain Managment: Goal: General experience of comfort will improve and/or be controlled Outcome: Progressing   Problem: Safety: Goal: Ability to remain free from injury will improve Outcome: Progressing   Problem: Skin Integrity: Goal: Risk for impaired skin integrity will decrease Outcome: Progressing

## 2023-12-23 NOTE — Progress Notes (Signed)
 LTM VIDEO EEG discontinued - no skin breakdown at [facility]

## 2023-12-23 NOTE — Progress Notes (Addendum)
 MRI reveals ACA territory infarct, in thedistribution of the stenosis seen on MRI,  I do think that this is likely the explanation for his worsening.  Alan Seals, MD [facility]   If 7pm- 7am, please page neurology on call as listed in AMION.

## 2023-12-23 NOTE — Progress Notes (Addendum)
 SLP Cancellation Note  Patient Details Name: Alan Wise MRN: [id number] DOB: 07/12/1970   Cancelled treatment:       Reason Eval/Treat Not Completed: Patient at procedure or test/unavailable. SLP followed for for clinical swallow assessment, however, pt off the floor for MRI upon arrival. Reviewed recent MBSS on 12/10/23 that demonstrated upper esophageal retention/residue of solids. No esophageal sweep was available. Given pt's wife reported concerns for swallowing solids, recommend repeat MBSS with esophageal sweep for further objective assessment. MBSS ordered and scheduled for 8am on 12/24/23.   Diet changed to D3, mechanical soft at wife's request. Updated wife on plan for MBSS tomorrow morning.    Alphonsus Doyel J Randon Somera 12/23/2023, 12:13 PM

## 2023-12-23 NOTE — Procedures (Addendum)
 Patient Name: Alan Wise  MRN: [id number]  Epilepsy Attending: Arlin MALVA Krebs  Referring Physician/Provider: Judithe Rocky BROCKS, NP  Duration: 12/22/2023 2256 to 12/23/2023 9073  Patient history: 53 year old male with altered mental status.  Today had left gaze preference, left hemianopia and facial droop with waxing and waning aphasia and weakness.  EEG to evaluate for seizure.  Level of alertness: Awake, asleep  AEDs during EEG study: LTG  Technical aspects: This EEG study was done with scalp electrodes positioned according to the 10-20 International system of electrode placement. Electrical activity was reviewed with band pass filter of 1-70Hz , sensitivity of 7 uV/mm, display speed of 49mm/sec with a 60Hz  notched filter applied as appropriate. EEG data were recorded continuously and digitally stored.  Video monitoring was available and reviewed as appropriate.  Description: The posterior dominant rhythm consists of 8-9 Hz activity of moderate voltage (25-35 uV) seen predominantly in posterior head regions, symmetric and reactive to eye opening and eye closing. Sleep was characterized by vertex waves, sleep spindles (12 to 14 Hz), maximal frontocentral region. EEG showed continuous 3 to 6 Hz theta-delta slowing in right frontotemporal region. Hyperventilation and photic stimulation were not performed.     ABNORMALITY - Continuous slow, right frontotemporal region  IMPRESSION: This study is suggestive of cortical dysfunction arising from right frontotemporal region likely secondary to underlying strokes, postictal state. No seizures or epileptiform discharges were seen throughout the recording.  Bryttany Tortorelli O Jehan Bonano

## 2023-12-23 NOTE — Progress Notes (Signed)
 LTM maint complete - no skin breakdown seen  Serviced F4 [facility] monitored

## 2023-12-23 NOTE — Plan of Care (Signed)
 Pt more alert and talkative today than yesterday. PT ambulated in halls/room and has been eating more.   Problem: Education: Goal: Knowledge of disease or condition will improve Outcome: Progressing   Problem: Ischemic Stroke/TIA Tissue Perfusion: Goal: Complications of ischemic stroke/TIA will be minimized Outcome: Progressing   Problem: Coping: Goal: Will verbalize positive feelings about self Outcome: Progressing   Problem: Coping: Goal: Will identify appropriate support needs Outcome: Progressing

## 2023-12-23 NOTE — Progress Notes (Signed)
 Physical Therapy Treatment Patient Details Name: Alan Wise MRN: [id number] DOB: Jul 07, 1970 Today's Date: 12/23/2023   History of Present Illness 53 y.o. male presents to [facility] 12/18/23 after falling two days prior with residual L sided weakness and somnolence. Brain MRI showed new patchy restricted diffusion in R basal ganglia, new punctuate focus of restricted diffusion in R caudate head. Recent admit 6/17-6/19 with anterior R temporal lobe punctate restricted diffusion. PMHx: T2DM, GERD, CVA w/ L sided weakness, oligodendroglioma    PT Comments  Pt is progressing towards goals. Currently pt has poor safety awareness and does not want to use AD; pt educated that an AD will help him become more independent quicker without needing someone hold his hand. Worked on aspects of DGI with pt requiring Min A due to impaired balance and righting reactions. Pt improved gait distance to 120 ft at CGA to Min A with and without quad cane. Attempted addressing education on how to work on tone but due to fatigue pt was unable to grasp concept and no evidence of learning pt perseverating on not wanting to use AD. Due to pt current functional status, home set up and available assistance at home recommending skilled physical therapy services > 3 hours/day in order to address strength, balance and functional mobility to decrease risk for falls, injury, immobility, skin break down and re-hospitalization.      If plan is discharge home, recommend the following: Assistance with cooking/housework;Assist for transportation;Help with stairs or ramp for entrance;A lot of help with walking and/or transfers;A lot of help with bathing/dressing/bathroom;Direct supervision/assist for medications management;Direct supervision/assist for financial management     Equipment Recommendations  None recommended by PT       Precautions / Restrictions Precautions Precautions: Fall Recall of Precautions/Restrictions:  Impaired Precaution/Restrictions Comments: L visual field cut at baseline Restrictions Weight Bearing Restrictions Per Provider Order: No Other Position/Activity Restrictions: L visual field cut at baseline     Mobility  Bed Mobility Overal bed mobility: Needs Assistance Bed Mobility: Supine to Sit, Sit to Supine     Supine to sit: Min assist, HOB elevated, Used rails Sit to supine: Contact guard assist, HOB elevated   General bed mobility comments: increased time and min A for trunk elevation. Cues to bring LLE off the EOB due to inattention    Transfers Overall transfer level: Needs assistance Equipment used: None, Quad cane Transfers: Sit to/from Stand Sit to Stand: Contact guard assist                Ambulation/Gait Ambulation/Gait assistance: Min assist, Contact guard assist Gait Distance (Feet): 120 Feet Assistive device: Quad cane, None Gait Pattern/deviations: Step-through pattern, Decreased stride length, Shuffle, Trunk flexed, Drifts right/left Gait velocity: intermittently slow then fast Gait velocity interpretation: <1.8 ft/sec, indicate of risk for recurrent falls   General Gait Details: improved gait speed with quad cane at CGA initially. Pt trying to walk very quickly. Pt was cued multiple times to slow down. Pt then left quad cane due to frustration and not wanting AD. Pt then required MIn A without an AD.    Modified Rankin (Stroke Patients Only) Modified Rankin (Stroke Patients Only) Pre-Morbid Rankin Score: Slight disability Modified Rankin: Moderately severe disability     Balance Overall balance assessment: Needs assistance, History of Falls Sitting-balance support: Feet supported Sitting balance-Leahy Scale: Fair Sitting balance - Comments: sitting EOB   Standing balance support: No upper extremity supported, During functional activity, Single extremity supported Standing balance-Leahy Scale: Poor Standing  balance comment: CGA-min A  without AD. worked on changing speed during gait and quick turns. Pt requires Min A for all transitions, stepping over object and quick turns.      Communication Communication Communication: Impaired Factors Affecting Communication: Difficulty expressing self;Reduced clarity of speech  Cognition Arousal: Lethargic Behavior During Therapy: Flat affect   PT - Cognitive impairments: Memory, Attention, Sequencing, Problem solving, Orientation   Orientation impairments: Time       PT - Cognition Comments: reduced verbalizations with low volume throughout session Following commands: Intact      Cueing Cueing Techniques: Verbal cues, Tactile cues, Visual cues     General Comments General comments (skin integrity, edema, etc.): spouse present during session and very supportive.      Pertinent Vitals/Pain Pain Assessment Pain Assessment: No/denies pain     PT Goals (current goals can now be found in the care plan section) Acute Rehab PT Goals Patient Stated Goal: to get more rehab PT Goal Formulation: With patient/family Time For Goal Achievement: 01/02/24 Potential to Achieve Goals: Good Progress towards PT goals: Progressing toward goals    Frequency    Min 3X/week      PT Plan  Continue with current POC\       AM-PAC PT 6 Clicks Mobility   Outcome Measure  Help needed turning from your back to your side while in a flat bed without using bedrails?: A Little Help needed moving from lying on your back to sitting on the side of a flat bed without using bedrails?: A Little Help needed moving to and from a bed to a chair (including a wheelchair)?: A Little Help needed standing up from a chair using your arms (e.g., wheelchair or bedside chair)?: A Little Help needed to walk in hospital room?: A Little Help needed climbing 3-5 steps with a railing? : A Little 6 Click Score: 18    End of Session Equipment Utilized During Treatment: Gait belt Activity Tolerance:  Patient tolerated treatment well Patient left: in bed;with call bell/phone within reach;with family/visitor present;with bed alarm set Nurse Communication: Mobility status PT Visit Diagnosis: Unsteadiness on feet (R26.81);Other abnormalities of gait and mobility (R26.89);Muscle weakness (generalized) (M62.81)     Time: 8579-8556 PT Time Calculation (min) (ACUTE ONLY): 23 min  Charges:    $Therapeutic Activity: 23-37 mins PT General Charges $$ ACUTE PT VISIT: 1 Visit                     Dorothyann Maier, DPT, CLT  Acute Rehabilitation Services Office: [phone] (Secure chat preferred)    Dorothyann VEAR Maier 12/23/2023, 2:50 PM

## 2023-12-24 ENCOUNTER — Inpatient Hospital Stay (HOSPITAL_COMMUNITY)

## 2023-12-24 ENCOUNTER — Other Ambulatory Visit: Payer: Self-pay

## 2023-12-24 ENCOUNTER — Inpatient Hospital Stay (HOSPITAL_COMMUNITY)
Admission: AD | Admit: 2023-12-24 | Discharge: 2024-01-11 | DRG: 056 | Disposition: A | Discharge status: Home Health | Source: Intra-hospital | Attending: Physical Medicine and Rehabilitation | Admitting: Physical Medicine and Rehabilitation

## 2023-12-24 ENCOUNTER — Ambulatory Visit: Attending: Internal Medicine | Admitting: Occupational Therapy

## 2023-12-24 DIAGNOSIS — R569 Unspecified convulsions: Secondary | ICD-10-CM | POA: Diagnosis not present

## 2023-12-24 DIAGNOSIS — Z823 Family history of stroke: Secondary | ICD-10-CM

## 2023-12-24 DIAGNOSIS — I10 Essential (primary) hypertension: Secondary | ICD-10-CM | POA: Diagnosis present

## 2023-12-24 DIAGNOSIS — Z91041 Radiographic dye allergy status: Secondary | ICD-10-CM

## 2023-12-24 DIAGNOSIS — I69319 Unspecified symptoms and signs involving cognitive functions following cerebral infarction: Secondary | ICD-10-CM

## 2023-12-24 DIAGNOSIS — R29810 Facial weakness: Secondary | ICD-10-CM | POA: Diagnosis present

## 2023-12-24 DIAGNOSIS — I69320 Aphasia following cerebral infarction: Secondary | ICD-10-CM

## 2023-12-24 DIAGNOSIS — K5901 Slow transit constipation: Secondary | ICD-10-CM | POA: Diagnosis not present

## 2023-12-24 DIAGNOSIS — E876 Hypokalemia: Secondary | ICD-10-CM | POA: Diagnosis present

## 2023-12-24 DIAGNOSIS — Z8349 Family history of other endocrine, nutritional and metabolic diseases: Secondary | ICD-10-CM

## 2023-12-24 DIAGNOSIS — C719 Malignant neoplasm of brain, unspecified: Secondary | ICD-10-CM | POA: Diagnosis present

## 2023-12-24 DIAGNOSIS — I63421 Cerebral infarction due to embolism of right anterior cerebral artery: Secondary | ICD-10-CM | POA: Diagnosis not present

## 2023-12-24 DIAGNOSIS — I63521 Cerebral infarction due to unspecified occlusion or stenosis of right anterior cerebral artery: Secondary | ICD-10-CM | POA: Diagnosis not present

## 2023-12-24 DIAGNOSIS — Z79899 Other long term (current) drug therapy: Secondary | ICD-10-CM

## 2023-12-24 DIAGNOSIS — E785 Hyperlipidemia, unspecified: Secondary | ICD-10-CM | POA: Diagnosis present

## 2023-12-24 DIAGNOSIS — I69354 Hemiplegia and hemiparesis following cerebral infarction affecting left non-dominant side: Secondary | ICD-10-CM | POA: Diagnosis present

## 2023-12-24 DIAGNOSIS — Z85828 Personal history of other malignant neoplasm of skin: Secondary | ICD-10-CM

## 2023-12-24 DIAGNOSIS — Z7901 Long term (current) use of anticoagulants: Secondary | ICD-10-CM

## 2023-12-24 DIAGNOSIS — E119 Type 2 diabetes mellitus without complications: Secondary | ICD-10-CM | POA: Diagnosis present

## 2023-12-24 DIAGNOSIS — K5900 Constipation, unspecified: Secondary | ICD-10-CM | POA: Diagnosis not present

## 2023-12-24 DIAGNOSIS — Z7982 Long term (current) use of aspirin: Secondary | ICD-10-CM

## 2023-12-24 DIAGNOSIS — Z87442 Personal history of urinary calculi: Secondary | ICD-10-CM

## 2023-12-24 DIAGNOSIS — I69391 Dysphagia following cerebral infarction: Secondary | ICD-10-CM

## 2023-12-24 DIAGNOSIS — Z923 Personal history of irradiation: Secondary | ICD-10-CM

## 2023-12-24 DIAGNOSIS — I69392 Facial weakness following cerebral infarction: Secondary | ICD-10-CM

## 2023-12-24 DIAGNOSIS — I639 Cerebral infarction, unspecified: Secondary | ICD-10-CM

## 2023-12-24 DIAGNOSIS — G40909 Epilepsy, unspecified, not intractable, without status epilepticus: Secondary | ICD-10-CM | POA: Diagnosis present

## 2023-12-24 DIAGNOSIS — Z8249 Family history of ischemic heart disease and other diseases of the circulatory system: Secondary | ICD-10-CM

## 2023-12-24 DIAGNOSIS — Z833 Family history of diabetes mellitus: Secondary | ICD-10-CM

## 2023-12-24 DIAGNOSIS — R627 Adult failure to thrive: Secondary | ICD-10-CM | POA: Diagnosis present

## 2023-12-24 DIAGNOSIS — N179 Acute kidney failure, unspecified: Secondary | ICD-10-CM | POA: Diagnosis not present

## 2023-12-24 DIAGNOSIS — R1314 Dysphagia, pharyngoesophageal phase: Secondary | ICD-10-CM | POA: Diagnosis present

## 2023-12-24 DIAGNOSIS — E43 Unspecified severe protein-calorie malnutrition: Secondary | ICD-10-CM | POA: Diagnosis present

## 2023-12-24 DIAGNOSIS — Z681 Body mass index (BMI) 19 or less, adult: Secondary | ICD-10-CM

## 2023-12-24 DIAGNOSIS — I6381 Other cerebral infarction due to occlusion or stenosis of small artery: Principal | ICD-10-CM

## 2023-12-24 DIAGNOSIS — R4701 Aphasia: Secondary | ICD-10-CM

## 2023-12-24 DIAGNOSIS — K219 Gastro-esophageal reflux disease without esophagitis: Secondary | ICD-10-CM | POA: Diagnosis present

## 2023-12-24 DIAGNOSIS — E440 Moderate protein-calorie malnutrition: Secondary | ICD-10-CM | POA: Insufficient documentation

## 2023-12-24 DIAGNOSIS — R112 Nausea with vomiting, unspecified: Secondary | ICD-10-CM

## 2023-12-24 LAB — HEMOGLOBIN A1C
Hgb A1c MFr Bld: 5.7 % — ABNORMAL HIGH (ref 4.8–5.6)
Mean Plasma Glucose: 117 mg/dL

## 2023-12-24 LAB — GLUCOSE, CAPILLARY: Glucose-Capillary: 148 mg/dL — ABNORMAL HIGH (ref 70–99)

## 2023-12-24 MED ORDER — MELATONIN 5 MG PO TABS
5.0000 mg | ORAL_TABLET | Freq: Every evening | ORAL | Status: DC | PRN
  Administered 2023-12-27: 5 mg via ORAL
  Filled 2023-12-24: qty 1

## 2023-12-24 MED ORDER — PROCHLORPERAZINE EDISYLATE 10 MG/2ML IJ SOLN
5.0000 mg | Freq: Four times a day (QID) | INTRAMUSCULAR | Status: DC | PRN
  Administered 2023-12-28: 10 mg via INTRAVENOUS
  Filled 2023-12-24: qty 2

## 2023-12-24 MED ORDER — LAMOTRIGINE 150 MG PO TABS
150.0000 mg | ORAL_TABLET | Freq: Every day | ORAL | Status: DC
  Administered 2023-12-25 – 2023-12-28 (×4): 150 mg via ORAL
  Filled 2023-12-24 (×4): qty 1

## 2023-12-24 MED ORDER — ALUM & MAG HYDROXIDE-SIMETH 200-200-20 MG/5ML PO SUSP: 30.0000 mL | ORAL | Status: DC | PRN

## 2023-12-24 MED ORDER — APIXABAN 5 MG PO TABS
5.0000 mg | ORAL_TABLET | Freq: Two times a day (BID) | ORAL | Status: DC
  Administered 2023-12-24 – 2024-01-11 (×36): 5 mg via ORAL
  Filled 2023-12-24 (×36): qty 1

## 2023-12-24 MED ORDER — DIPHENHYDRAMINE HCL 25 MG PO CAPS
25.0000 mg | ORAL_CAPSULE | Freq: Four times a day (QID) | ORAL | Status: DC | PRN
  Administered 2024-01-10: 25 mg via ORAL
  Filled 2023-12-24: qty 1

## 2023-12-24 MED ORDER — GUAIFENESIN-DM 100-10 MG/5ML PO SYRP: 5.0000 mL | ORAL_SOLUTION | Freq: Four times a day (QID) | ORAL | Status: DC | PRN

## 2023-12-24 MED ORDER — ATORVASTATIN CALCIUM 40 MG PO TABS
40.0000 mg | ORAL_TABLET | Freq: Every day | ORAL | Status: DC
  Administered 2023-12-25 – 2024-01-10 (×16): 40 mg via ORAL
  Filled 2023-12-24 (×15): qty 1

## 2023-12-24 MED ORDER — PROCHLORPERAZINE MALEATE 5 MG PO TABS: 5.0000 mg | ORAL_TABLET | Freq: Four times a day (QID) | ORAL | Status: DC | PRN

## 2023-12-24 MED ORDER — ACETAMINOPHEN 325 MG PO TABS: 325.0000 mg | ORAL_TABLET | ORAL | Status: DC | PRN

## 2023-12-24 MED ORDER — BISACODYL 10 MG RE SUPP: 10.0000 mg | Freq: Every day | RECTAL | Status: DC | PRN

## 2023-12-24 MED ORDER — PROPRANOLOL HCL 20 MG PO TABS
20.0000 mg | ORAL_TABLET | Freq: Two times a day (BID) | ORAL | Status: DC
  Administered 2023-12-24 – 2024-01-11 (×35): 20 mg via ORAL
  Filled 2023-12-24 (×37): qty 1

## 2023-12-24 MED ORDER — PROCHLORPERAZINE 25 MG RE SUPP: 12.5000 mg | Freq: Four times a day (QID) | RECTAL | Status: DC | PRN

## 2023-12-24 MED ORDER — AMLODIPINE BESYLATE 5 MG PO TABS
5.0000 mg | ORAL_TABLET | Freq: Every day | ORAL | Status: DC
  Administered 2023-12-25 – 2024-01-11 (×18): 5 mg via ORAL
  Filled 2023-12-24 (×18): qty 1

## 2023-12-24 MED ORDER — ENSURE PLUS HIGH PROTEIN PO LIQD
237.0000 mL | Freq: Two times a day (BID) | ORAL | Status: DC
  Administered 2023-12-25 – 2023-12-29 (×5): 237 mL via ORAL

## 2023-12-24 MED ORDER — FLEET ENEMA RE ENEM: 1.0000 | ENEMA | Freq: Once | RECTAL | Status: DC | PRN

## 2023-12-24 MED ORDER — LAMOTRIGINE 100 MG PO TABS
200.0000 mg | ORAL_TABLET | Freq: Every day | ORAL | Status: DC
  Administered 2023-12-24 – 2023-12-27 (×4): 200 mg via ORAL
  Filled 2023-12-24 (×4): qty 2

## 2023-12-24 NOTE — Progress Notes (Signed)
 Physical Therapy Treatment Patient Details Name: Alan Wise MRN: [id number] DOB: 08-23-1970 Today's Date: 12/24/2023   History of Present Illness 53 y.o. male presents to [facility] 12/18/23 after falling two days prior with residual L sided weakness and somnolence. Brain MRI showed new patchy restricted diffusion in R basal ganglia, new punctuate focus of restricted diffusion in R caudate head. Recent admit 6/17-6/19 with anterior R temporal lobe punctate restricted diffusion. PMHx: T2DM, GERD, CVA w/ L sided weakness, oligodendroglioma   PT Comments  Pt in bed upon arrival and eager for PT session. Worked on dynamic balance and LE strength in today's session. Pt had increased difficulty with quick 180 turns, lateral side-steps to the left, and stepping over obstacles. MinA/ModA required for unsteadiness and small losses of balance. Pt does not have stepping response, however, will reach for UE support when experiencing a loss of balance. Continue to recommend >3hrs post acute rehab to maximize rehab potential. Acute PT to follow.     If plan is discharge home, recommend the following: Assistance with cooking/housework;Assist for transportation;Help with stairs or ramp for entrance;A lot of help with walking and/or transfers;A lot of help with bathing/dressing/bathroom;Direct supervision/assist for medications management;Direct supervision/assist for financial management   Can travel by private vehicle      Yes  Equipment Recommendations  None recommended by PT       Precautions / Restrictions Precautions Precautions: Fall Recall of Precautions/Restrictions: Impaired Precaution/Restrictions Comments: L visual field cut at baseline Restrictions Weight Bearing Restrictions Per Provider Order: No Other Position/Activity Restrictions: L visual field cut at baseline     Mobility  Bed Mobility Overal bed mobility: Needs Assistance Bed Mobility: Supine to Sit, Sit to Supine    Supine to sit:  Min assist Sit to supine: Min assist   General bed mobility comments: MinA to assist with raising trunk. MinA for return to supine for slight LE management    Transfers Overall transfer level: Needs assistance Equipment used: None Transfers: Sit to/from Stand Sit to Stand: Contact guard assist    General transfer comment: CGA for safety    Ambulation/Gait Ambulation/Gait assistance: Min assist Gait Distance (Feet): 150 Feet (x150, x150) Assistive device: None Gait Pattern/deviations: Step-through pattern, Decreased stride length, Shuffle, Trunk flexed, Drifts right/left       General Gait Details: MinA for slight unsteadiness when ambulating with no challenges to balance. ModA with lateral side-steps and stepping over obstacles    Modified Rankin (Stroke Patients Only) Modified Rankin (Stroke Patients Only) Pre-Morbid Rankin Score: Slight disability Modified Rankin: Moderately severe disability     Balance Overall balance assessment: Needs assistance, History of Falls Sitting-balance support: Feet supported Sitting balance-Leahy Scale: Fair Sitting balance - Comments: sitting EOB   Standing balance support: No upper extremity supported, During functional activity, Single extremity supported Standing balance-Leahy Scale: Poor Standing balance comment: able to stand with CGA and no UE support    High level balance activites: Side stepping, Direction changes, Turns, Sudden stops High Level Balance Comments: Fast/slow, x4 quick turns,       Communication Communication Communication: Impaired Factors Affecting Communication: Difficulty expressing self;Reduced clarity of speech  Cognition Arousal: Alert Behavior During Therapy: Flat affect   PT - Cognitive impairments: Memory, Attention, Sequencing, Problem solving, Orientation    Following commands: Intact      Cueing Cueing Techniques: Verbal cues, Tactile cues, Visual cues  Exercises Other Exercises Other  Exercises: x3 reps of lateral side steps for 10 ft Other Exercises: Bx5 step over  obstacle Other Exercises: Bx5 step ups with R hand on rail Other Exercises: x5 elbow extension PROM        Pertinent Vitals/Pain Pain Assessment Pain Assessment: No/denies pain     PT Goals (current goals can now be found in the care plan section) Acute Rehab PT Goals PT Goal Formulation: With patient/family Time For Goal Achievement: 01/02/24 Potential to Achieve Goals: Good Progress towards PT goals: Progressing toward goals    Frequency    Min 3X/week       AM-PAC PT 6 Clicks Mobility   Outcome Measure  Help needed turning from your back to your side while in a flat bed without using bedrails?: A Little Help needed moving from lying on your back to sitting on the side of a flat bed without using bedrails?: A Little Help needed moving to and from a bed to a chair (including a wheelchair)?: A Little Help needed standing up from a chair using your arms (e.g., wheelchair or bedside chair)?: A Little Help needed to walk in hospital room?: A Little Help needed climbing 3-5 steps with a railing? : A Little 6 Click Score: 18    End of Session Equipment Utilized During Treatment: Gait belt Activity Tolerance: Patient tolerated treatment well Patient left: in bed;with call bell/phone within reach;with family/visitor present;with bed alarm set Nurse Communication: Mobility status PT Visit Diagnosis: Unsteadiness on feet (R26.81);Other abnormalities of gait and mobility (R26.89);Muscle weakness (generalized) (M62.81)     Time: 8666-8591 PT Time Calculation (min) (ACUTE ONLY): 35 min  Charges:    $Gait Training: 8-22 mins $Therapeutic Activity: 8-22 mins PT General Charges $$ ACUTE PT VISIT: 1 Visit                    Kate ORN, PT, DPT Secure Chat Preferred  Rehab Office [phone]  Kate BRAVO Wendolyn 12/24/2023, 3:50 PM

## 2023-12-24 NOTE — Discharge Summary (Signed)
 Physician Discharge Summary   Alan Wise FMW:Wise DOB: 06-Jun-1971 DOA: 12/18/2023  PCP: Antonette Angeline ORN, NP  Admit date: 12/18/2023 Discharge date: 12/24/2023  Admitted From: Home  Disposition:  CIR Discharging physician: Alm Apo, MD Barriers to discharge: none  Recommendations at discharge: If still having dysphagia or concern for aspiration, can follow up with GI outpatient after further stroke recovery/stabilization  SLP to continue assessing while at CIR Still at risk for further new strokes with severe ACA stenosis; continue to monitor for any neurologic changes or decline   Discharge Condition: stable CODE STATUS: Full  Diet recommendation:  Diet Orders (From admission, onward)     Start     Ordered   12/23/23 1240  DIET DYS 3 Room service appropriate? Yes; Fluid consistency: Thin  Diet effective now       Question Answer Comment  Room service appropriate? Yes   Fluid consistency: Thin      12/23/23 1239            Hospital Course: 53 y.o. male with medical history significant of T2DM, hyperlipidemia, GERD and low grade glioma who presented with AMS initially to [facility]  Recent hospitalization at [facility] 06/17 to 12/10/23 for altered mental status, work up was positive for anterior right temporal lobe punctate restricted diffusion per brain MRI.  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  Prior to admission, he was found on the floor and had difficulty standing back on his feet.  He also had ongoing left-sided weakness and intermittent confusion. He was admitted for ongoing workup.   CT head with stable encephalomalacia changes and dystrophic calcification within the right cerebral hemisphere, compatible with previously treated neoplasm. No adverse interval change.   Brain MRI with 3.2 x 1.3 focus of patchy restricted diffusion  within the right basal ganglia, new from the recent prior brain MRI of 12/09/23. New punctate focus of restricted diffusion within the right caudate head. Small focus of restricted diffusion demonstrated within the right temporal lobe on prior MRI is no longer present.   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 DOAC when improved balance and decrease fall risk.   06/29 patient tolerating well increase dose of lamotrigine , still not fully recovered balance. 07/01 patient more somnolent and less reactive, confused and mildly agitated. Not eating well. Re consulted neurology, patient will get repeat EEG and more imaging.     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  Changes felt to be due to new stroke per neurology - Continue dysphagia 3 diet.  SLP to continue following at CIR.  Some aspiration with liquids following solid food on MBSS; too high risk for inpatient GI procedures in this setting (discussed with GI as well). Will plan on outpatient follow-up with GI after acute illness subsides further  Dysphagia - more swallowing difficulty over past 1-2 days; suspect some contribution from new strokes - SLP following, appreciate assistance - Modified barium swallow performed 12/24/2023.  Continue dysphagia 3 diet -Eventual outpatient follow-up with GI  Seizure  disorder Possible focal seizures.  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    Principal Diagnosis: Acute CVA (cerebrovascular accident) [facility])  Discharge Diagnoses: Active Hospital Problems   Diagnosis Date Noted   Acute CVA (cerebrovascular accident) (HCC) 12/18/2023    Priority: 1.   Dysphagia 09/14/2023    Priority: 1.   Seizure disorder (HCC) 09/10/2023    Priority: 2.   Essential hypertension 09/09/2023    Priority: 3.   Type 2 diabetes mellitus with hyperlipidemia (HCC) 06/12/2014    Priority: 4.   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    Resolved Hospital Problems  No resolved problems to display.      Allergies as of 12/24/2023       Reactions   Contrast Media [iodinated Contrast Media] Swelling        Medication List     ASK your doctor about these medications    amLODipine  5 MG tablet Commonly known as: NORVASC  TAKE 1 TABLET (5 MG TOTAL) BY MOUTH DAILY.   apixaban  5 MG Tabs tablet Commonly known as: ELIQUIS  Take 1 tablet (5 mg total) by mouth 2 (two) times daily.   ARIPiprazole  5 MG tablet Commonly known as: Abilify  Take 1 tablet (5 mg total) by mouth daily.   aspirin  EC 81 MG tablet Take 81 mg by mouth daily. Swallow whole.   lamoTRIgine  150 MG tablet Commonly known as: LAMICTAL  Take 150 mg by mouth 2 (two) times daily.   propranolol  20 MG tablet Commonly known as: INDERAL  TAKE 1 TABLET BY MOUTH TWICE A DAY        Allergies  Allergen Reactions   Contrast Media [Iodinated Contrast Media] Swelling     Consultations: Neurology  Procedures:   Discharge Exam: BP 131/85 (BP Location: Left Arm)   Pulse 64   Temp 98.3 F (36.8 C)   Resp 17   Ht 5' 8 (1.727 m)   Wt 63.5 kg   SpO2 96%   BMI 21.29 kg/m  Physical Exam Constitutional:      General: He is not in acute distress.    Comments: Obvious expressive aphasia noted.  Follows commands easily  HENT:     Head: Normocephalic and atraumatic.     Mouth/Throat:     Mouth: Mucous membranes are moist.  Eyes:     Extraocular Movements: Extraocular movements intact.  Cardiovascular:     Rate and Rhythm: Normal rate and regular rhythm.  Pulmonary:     Effort: Pulmonary effort is normal. No respiratory distress.     Breath sounds: Normal breath sounds. No wheezing.  Abdominal:     General: Bowel sounds are normal. There is no distension.     Palpations: Abdomen is soft.     Tenderness: There is no abdominal tenderness.  Musculoskeletal:        General: Normal range of motion.  Cervical back: Normal range of motion and neck supple.  Skin:    General: Skin is warm and dry.  Neurological:     Comments: Severe expressive aphasia (baseline).  Follows commands and moves all 4 extremities.  Weaker on left upper and lower extremity (4/5)  Psychiatric:        Behavior: Behavior normal.      The results of significant diagnostics from this hospitalization (including imaging, microbiology, ancillary and laboratory) are listed below for reference.   Microbiology: No results found for this or any previous visit (from the past 240 hours).   Labs: BNP (last 3 results) No results for input(s): BNP in the last 8760 hours. Basic Metabolic Panel: Recent Labs  Lab 12/18/23 1425 12/18/23 1440 12/19/23 0523 12/23/23 0448  NA 139 138 140 137  K 4.6 4.4 3.9 4.1  CL 98 100 103 102  CO2  --  29 27 26   GLUCOSE 107* 105* 75 117*  BUN 19 15 13 11   CREATININE 1.20 1.14 0.90 0.87  CALCIUM   --  9.9 9.3 9.4   Liver Function  Tests: Recent Labs  Lab 12/18/23 1440 12/23/23 0448  AST 27 20  ALT 44 32  ALKPHOS 62 56  BILITOT 0.6 0.6  PROT 7.7 6.5  ALBUMIN 4.3 3.6   No results for input(s): LIPASE, AMYLASE in the last 168 hours. No results for input(s): AMMONIA in the last 168 hours. CBC: Recent Labs  Lab 12/18/23 1425 12/18/23 1440 12/19/23 0523  WBC  --  6.9 6.9  NEUTROABS  --  4.7  --   HGB 16.0 16.2 14.7  HCT 47.0 47.8 43.5  MCV  --  87.7 86.0  PLT  --  287 247   Cardiac Enzymes: No results for input(s): CKTOTAL, CKMB, CKMBINDEX, TROPONINI in the last 168 hours. BNP: Invalid input(s): POCBNP CBG: No results for input(s): GLUCAP in the last 168 hours. D-Dimer No results for input(s): DDIMER in the last 72 hours. Hgb A1c No results for input(s): HGBA1C in the last 72 hours. Lipid Profile No results for input(s): CHOL, HDL, LDLCALC, TRIG, CHOLHDL, LDLDIRECT in the last 72 hours. Thyroid  function studies No results for input(s): TSH, T4TOTAL, T3FREE, THYROIDAB in the last 72 hours.  Invalid input(s): FREET3 Anemia work up No results for input(s): VITAMINB12, FOLATE, FERRITIN, TIBC, IRON, RETICCTPCT in the last 72 hours. Urinalysis    Component Value Date/Time   COLORURINE AMBER (A) 12/09/2023 0025   APPEARANCEUR HAZY (A) 12/09/2023 0025   APPEARANCEUR Cloudy (A) 02/20/2016 1441   LABSPEC 1.029 12/09/2023 0025   LABSPEC 1.026 05/04/2012 2052   PHURINE 5.0 12/09/2023 0025   GLUCOSEU NEGATIVE 12/09/2023 0025   GLUCOSEU Negative 05/04/2012 2052   HGBUR NEGATIVE 12/09/2023 0025   BILIRUBINUR NEGATIVE 12/09/2023 0025   BILIRUBINUR Negative 02/20/2016 1441   BILIRUBINUR Negative 05/04/2012 2052   KETONESUR 20 (A) 12/09/2023 0025   PROTEINUR >=300 (A) 12/09/2023 0025   NITRITE NEGATIVE 12/09/2023 0025   LEUKOCYTESUR NEGATIVE 12/09/2023 0025   LEUKOCYTESUR Negative 05/04/2012 2052   Sepsis Labs Recent Labs  Lab 12/18/23 1440  12/19/23 0523  WBC 6.9 6.9   Microbiology No results found for this or any previous visit (from the past 240 hours).  Procedures/Studies: DG Swallowing Func-Speech Pathology Result Date: 12/24/2023 Table formatting from the original result was not included. Modified Barium Swallow Study Patient Details Name: Alan Wise Date of Birth: 06/30/1970 Today's Date: 12/24/2023 HPI/PMH: HPI: 53 y.o. male presents to [facility]  12/18/23 after falling two days prior with residual L sided weakness and somnolence. Brain MRI showed new patchy restricted diffusion in R basal ganglia, new punctuate focus of restricted diffusion in R caudate head. Recent admit 6/17-6/19 with anterior R temporal lobe punctate restricted diffusion. PMHx: T2DM, GERD, CVA w/ L sided weakness, oligodendroglioma Clinical Impression: Pt presents with a mild pharyngeal dysphagia and a primary esophageal dysphagia that is likely contributing to pharyngeal deficits. Recommend follow up with GI to determine if pt is a candidate for any intervention to aid esophageal clearance of solids and liquids. Oral phase judged to be WNL. Pharyngeal deficits characterized by reduced base of tongue retraction, occasional mistiming of epiglottic inversion (vs incomplete inversion) and laryngeal vestibule closure, which contributed to aspiration events. Reduced distention of the UES was the greatest pharyngeal concern. Concerns for esophageal dysphagia included moderate retention of pudding and solid in upper esophagus and backflow of thin liquids (by straw) through the UES. Retention of solids likely contributed to aspiration event of thin liquid wash that followed solid trials. During esophageal sweep, retention of liquids observed in the lower esophagus with some retrograde flow.  This residue did eventually pass to the stomach. Findings: -All aspiration events were audible and minimal. -There was audible aspiration of nectar-thick liquids x1 by cup  during the swallow, suspect due to mistiming of epiglottic inversion and laryngeal vestibule closure. -There was audible aspiration of thin liquid wash that followed solid trial. -There was pharyngeal residue primarily in the UES post swallow. Residue amount increased with increased viscosity. -special note: pt does have calcification of portions of the airway and suspected in portion of artery around the anterior upper esophagus. This is mentioned because calcified areas move with swallow initiation and it briefly looks like aspiration. Detailed diet recommendations and aspiration precautions outlined below. Despite aspiration of thin liquids and ongoing risk of aspiration, recommend continue this diet given pt has not exhibited concerns for pneumonia and pt is at a risk of aspirated nectar-thick liquids. Research shows the aspiration of thickened liquids can be more detrimental to the lungs; therefore, we will continue to monitor his tolerance of thin liquids closely. SLP will continue to follow for language and swallowing intervention. Factors that may increase risk of adverse event in presence of aspiration [name] & [name] 2021): No data recorded Recommendations/Plan: Swallowing Evaluation Recommendations Swallowing Evaluation Recommendations Recommendations: PO diet PO Diet Recommendation: Thin liquids (Level 0); Dysphagia 3 (Mechanical soft) Liquid Administration via: Cup; No straw Medication Administration: Whole meds with puree (consider crushing if he is having pill dysphagia) Supervision: Patient able to self-feed; Intermittent supervision/cueing for swallowing strategies Swallowing strategies  : Slow rate; Small bites/sips; Follow solids with liquids Postural changes: Position pt fully upright for meals; Stay upright 30-60 min after meals Oral care recommendations: Oral care BID (2x/day) Recommended consults: Consider GI consultation Treatment Plan Treatment Plan Treatment recommendations: Therapy as  outlined in treatment plan below Follow-up recommendations: Acute inpatient rehab (3 hours/day) Functional status assessment: Patient has had a recent decline in their functional status and demonstrates the ability to make significant improvements in function in a reasonable and predictable amount of time. Treatment frequency: Min 2x/week Treatment duration: 4 weeks Interventions: Aspiration precaution training; Diet toleration management by SLP; Compensatory techniques; Patient/family education; Respiratory muscle strength training Recommendations Recommendations for follow up therapy are one component of a multi-disciplinary discharge planning process, led by the attending physician.  Recommendations may be updated based on patient status, additional functional criteria and insurance authorization. Assessment:  Orofacial Exam: Orofacial Exam Oral Cavity: Oral Hygiene: WFL Oral Cavity - Dentition: Adequate natural dentition Orofacial Anatomy: WFL Oral Motor/Sensory Function: WFL Anatomy: Anatomy: WFL Boluses Administered: Boluses Administered Boluses Administered: Thin liquids (Level 0); Mildly thick liquids (Level 2, nectar thick); Puree; Solid  Oral Impairment Domain: Oral Impairment Domain Lip Closure: No labial escape Tongue control during bolus hold: Cohesive bolus between tongue to palatal seal Bolus preparation/mastication: Timely and efficient chewing and mashing Bolus transport/lingual motion: Brisk tongue motion Oral residue: Trace residue lining oral structures Location of oral residue : Tongue Initiation of pharyngeal swallow : Posterior angle of the Wise  Pharyngeal Impairment Domain: Pharyngeal Impairment Domain Soft palate elevation: No bolus between soft palate (SP)/pharyngeal wall (PW) Laryngeal elevation: Complete superior movement of thyroid  cartilage with complete approximation of arytenoids to epiglottic petiole Anterior hyoid excursion: Complete anterior movement Epiglottic movement: Partial  inversion (varied; inconsistent) Laryngeal vestibule closure: Complete, no air/contrast in laryngeal vestibule Pharyngeal stripping wave : Present - diminished Pharyngeal contraction (A/P view only): Incomplete (pseudoviderticulae) Pharyngoesophageal segment opening: Partial distention/partial duration, partial obstruction of flow Tongue base retraction: Trace column of contrast or air between tongue base and PPW Pharyngeal residue: Collection of residue within or on pharyngeal structures Location of pharyngeal residue: Pyriform sinuses; Pharyngeal wall  Esophageal Impairment Domain: Esophageal Impairment Domain Esophageal clearance upright position: Esophageal retention with retrograde flow through the PES Pill: No data recorded Penetration/Aspiration Scale Score: Penetration/Aspiration Scale Score 1.  Material does not enter airway: Thin liquids (Level 0); Mildly thick liquids (Level 2, nectar thick); Puree; Solid 7.  Material enters airway, passes BELOW cords and not ejected out despite cough attempt by patient: Thin liquids (Level 0); Mildly thick liquids (Level 2, nectar thick) Compensatory Strategies: Compensatory Strategies Compensatory strategies: Yes Liquid wash: -- (partially effective for reducing residue in upper esophagus)   General Information: Caregiver present: No (follow up completed at bedside post study)  Diet Prior to this Study: Dysphagia 3 (mechanical soft); Thin liquids (Level 0)   Temperature : Normal   Respiratory Status: WFL   Supplemental O2: None (Room air)   History of Recent Intubation: No  Behavior/Cognition: Alert; Cooperative Self-Feeding Abilities: Able to self-feed Baseline vocal quality/speech: Normal No data recorded Volitional Swallow: Able to elicit Exam Limitations: No limitations Goal Planning: Prognosis for improved oropharyngeal function: Good Barriers to Reach Goals: Language deficits No data recorded Patient/Family Stated Goal: improve communication; hopeful to get back  to his baseline Consulted and agree with results and recommendations: Patient; Family member/caregiver; Physician Pain: Pain Assessment Pain Assessment: No/denies pain Faces Pain Scale: 2 Pain Location: L shoulder with mobility Pain Descriptors / Indicators: Discomfort; Aching Pain Intervention(s): Limited activity within patient's tolerance; Monitored during session; Other (comment) (ROM for stiffness) End of Session: Start Time:SLP Start Time (ACUTE ONLY): 0830 Stop Time: SLP Stop Time (ACUTE ONLY): 9141 Time Calculation:SLP Time Calculation (min) (ACUTE ONLY): 28 min Charges: SLP Evaluations $ SLP Speech Visit: 1 Visit SLP Evaluations $MBS Swallow: 1 Procedure $Speech Treatment for Individual: 1 Procedure SLP visit diagnosis: SLP Visit Diagnosis: Aphasia (R47.01); Cognitive communication deficit (R41.841); Dysphagia, pharyngoesophageal phase (R13.14) Past Medical History: Past Medical History: Diagnosis Date  Allergy   Complication of anesthesia   pt reports he is starting to have more difficulty arousing after surgery  Diabetes mellitus (HCC)   GERD (gastroesophageal reflux disease)   Headache   History of kidney stones   Hyperlipidemia   Renal disorder   kidney stones Past Surgical History: Past Surgical History: Procedure Laterality  Date  APPLICATION OF CRANIAL NAVIGATION Right 01/17/2021  Procedure: APPLICATION OF CRANIAL NAVIGATION;  Surgeon: Debby Dorn MATSU, MD;  Location: [facility] OR;  Service: Neurosurgery;  Laterality: Right;  COLONOSCOPY  09/03/1994  COLONOSCOPY WITH PROPOFOL  N/A 01/07/2023  Procedure: COLONOSCOPY WITH PROPOFOL ;  Surgeon: Jinny Carmine, MD;  Location: [facility] ENDOSCOPY;  Service: Endoscopy;  Laterality: N/A;  NOT TOO EARLY  CYSTOSCOPY WITH STENT PLACEMENT Right 01/25/2016  Procedure: CYSTOSCOPY WITH STENT PLACEMENT;  Surgeon: Redell Lynwood Napoleon, MD;  Location: [facility] ORS;  Service: Urology;  Laterality: Right;  FRAMELESS  BIOPSY WITH BRAINLAB Right 01/17/2021  Procedure: RIGHT STEREOTACTIC BIOPSY  OF INSULAR LESION;  Surgeon: Debby Dorn MATSU, MD;  Location: [facility] OR;  Service: Neurosurgery;  Laterality: Right;  KNEE ARTHROSCOPY W/ MENISCAL REPAIR Right 06/23/1988  POLYPECTOMY  01/07/2023  Procedure: POLYPECTOMY;  Surgeon: Jinny Carmine, MD;  Location: [facility] ENDOSCOPY;  Service: Endoscopy;;  removal of birthmark  06/08/2013  SKIN CANCER EXCISION  06/23/2012  TYMPANOSTOMY TUBE PLACEMENT  08/06/1978  URETEROSCOPY WITH HOLMIUM LASER LITHOTRIPSY Right 01/25/2016  Procedure: URETEROSCOPY WITH HOLMIUM LASER LITHOTRIPSY;  Surgeon: Redell Lynwood Napoleon, MD;  Location: [facility] ORS;  Service: Urology;  Laterality: Right;  URETHRAL STRICTURE DILATATION    visual inspection of vocal cord    1973  WISDOM TOOTH EXTRACTION   [name] 12/24/2023, 12:12 PM  MR BRAIN WO CONTRAST Result Date: 12/23/2023 CLINICAL DATA:  53 year old male with punctate right temporal lobe infarct last month. Neurologic deficit, right basal ganglia infarct on 12/18/2023. EXAM: MRI HEAD WITHOUT CONTRAST TECHNIQUE: Multiplanar, multiecho pulse sequences of the brain and surrounding structures were obtained without intravenous contrast. COMPARISON:  Brain MRI 12/18/2023 and earlier. FINDINGS: Brain: Progressive restricted diffusion now beginning anterior to the right caudate and continuing through the nearby right ACA territory, confluent involvement along a 4-5 cm segment of the right cingulate (series 3, image 40). Unresolved diffusion restriction previously seen along the right frontal horn, and also in the posterior right corona radiata tracking toward the right lentiform, and also lateral to the atrium of the right lateral ventricle. Also punctate new restricted diffusion in the left lentiform series 3, image 30. T2 and FLAIR hyperintense cytotoxic edema in the affected areas. Petechial hemorrhage on T2* imaging not significantly changed from previous SWI Heidelberg classification 1b: HI2, confluent petechiae, no mass effect. Pre-existing cystic  encephalomalacia and hemosiderin along the anterior right MCA division also. Stable T2 and FLAIR hyperintense probable gliosis of the right insula there. Stable encephalomalacia with T2 shine through along the anterior the left genu of the corpus callosum. Chronic brainstem Wallerian degeneration, greater on the right. No midline shift, mass effect, evidence of mass lesion, acute ventriculomegaly, extra-axial collection. Cervicomedullary junction and pituitary are within normal limits. Vascular: Major intracranial vascular flow voids are stable. Skull and upper cervical spine: Negative. Visualized bone marrow signal is within normal limits. Sinuses/Orbits: Stable paranasal sinus mucosal thickening and opacification. Negative orbits. Other: Mastoids well aerated. IMPRESSION: 1. New acute and confluent mid and distal Right ACA territory infarction. Punctate new left basal ganglia acute lacunar infarct. And ongoing multifocal right MCA deep white and deep gray matter ischemia otherwise not significantly changed from 12/18/2023. 2. Associated fatigue no hemorrhage. No malignant hemorrhagic transformation. No intracranial mass effect or midline shift. 3. Underlying chronic ischemic disease and encephalomalacia. Electronically Signed   By: VEAR Hurst M.D.   On: 12/23/2023 13:02   Overnight EEG with video Result Date: 12/23/2023 [name], MD     12/23/2023 12:49  PM Patient Name: Alan Wise Epilepsy Attending: Arlin MALVA Krebs Referring Physician/Provider: Judithe Rocky BROCKS, NP Duration: 12/22/2023 2256 to 12/23/2023 [id number] Patient history: 53 year old male with altered mental status.  Today had left gaze preference, left hemianopia and facial droop with waxing and waning aphasia and weakness.  EEG to evaluate for seizure. Level of alertness: Awake, asleep AEDs during EEG study: LTG Technical aspects: This EEG study was done with scalp electrodes positioned according to the 10-20 International system of  electrode placement. Electrical activity was reviewed with band pass filter of 1-70Hz , sensitivity of 7 uV/mm, display speed of 29mm/sec with a 60Hz  notched filter applied as appropriate. EEG data were recorded continuously and digitally stored.  Video monitoring was available and reviewed as appropriate. Description: The posterior dominant rhythm consists of 8-9 Hz activity of moderate voltage (25-35 uV) seen predominantly in posterior head regions, symmetric and reactive to eye opening and eye closing. Sleep was characterized by vertex waves, sleep spindles (12 to 14 Hz), maximal frontocentral region. EEG showed continuous 3 to 6 Hz theta-delta slowing in right frontotemporal region. Hyperventilation and photic stimulation were not performed.   ABNORMALITY - Continuous slow, right frontotemporal region IMPRESSION: This study is suggestive of cortical dysfunction arising from right frontotemporal region likely secondary to underlying strokes, postictal state. No seizures or epileptiform discharges were seen throughout the recording. Arlin MALVA Krebs   MR ANGIO HEAD WO CONTRAST Result Date: 12/23/2023 CLINICAL DATA:  Neuro deficit, acute, stroke suspected EXAM: MRA HEAD WITHOUT CONTRAST TECHNIQUE: Angiographic images of the Circle of Willis were acquired using MRA technique without intravenous contrast. COMPARISON:  CT head from earlier today. FINDINGS: Anterior circulation: Hypoplastic left A1 ACA. Otherwise, bilateral intracranial ICAs, MCAs, and ACAs are patent. 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 Posterior circulation: Bilateral intradural vertebral arteries, basilar artery and bilateral posterior cerebral arteries are patent without proximal hemodynamically significant stenosis. Bilateral fetal type PCAs, anatomic variant. IMPRESSION: 1. No emergent large vessel occlusion. 2. 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 Electronically Signed   By: Gilmore GORMAN Molt M.D.   On: 12/23/2023 00:48   CT HEAD WO CONTRAST  ( ) Result Date: 12/22/2023 CLINICAL DATA:  Stroke, follow up EXAM: CT HEAD WITHOUT CONTRAST TECHNIQUE: Contiguous axial images were obtained from the base of the skull through the vertex without intravenous contrast. RADIATION DOSE REDUCTION: This exam was performed according to the departmental dose-optimization program which includes automated exposure control, adjustment of the mA and/or kV according to patient size and/or use of iterative reconstruction technique. COMPARISON:  CT head December 18, 2023. FINDINGS: Brain: Stable areas of dystrophic calcification encephalomalacia in the regions of prior treated malignancy including right frontal lobe and right periatrial region. Known infarcts were better characterized on recent MRI, but do not appear substantially changed when comparing across modalities. No progressive mass effect or acute hemorrhage. Vascular: No hyperdense vessel identified. Skull: No acute fracture. Sinuses/Orbits: Ethmoid air cell and frontal sinus mucosal thickening. No acute orbital findings. Other: No mastoid effusions. IMPRESSION: 1. Known infarcts were better characterized on recent MRI, but do not appear substantially changed when comparing across modalities. No progressive mass effect or acute hemorrhage. 2. Similar appearance of treated tumor with dystrophic calcification. Electronically Signed   By: Gilmore GORMAN Molt M.D.   On: 12/22/2023 20:13   DG Shoulder Left Result Date: 12/19/2023 CLINICAL DATA:  [id number] Pain [id number] EXAM: LEFT SHOULDER - 2+ VIEW COMPARISON:  April 11, 2023 FINDINGS: No acute fracture or dislocation. There is  no evidence of arthropathy or other focal bone abnormality. Soft tissues are unremarkable. IMPRESSION: No acute fracture or dislocation. Electronically Signed   By: Rogelia Myers M.D.   On: 12/19/2023 19:21   EEG adult Result Date: 12/19/2023 [name], MD     12/19/2023  4:51 PM Patient Name: Alan Wise Epilepsy  Attending: [name] Shelton Referring Physician/Provider: Noralee Elidia Sieving, MD Date: 12/19/2023 Duration: 27.36 mins Patient history: 53 y.o. 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 EEG to evaluate for seizure Level of alertness: Awake, asleep AEDs during EEG study: LTG Technical aspects: This EEG study was done with scalp electrodes positioned according to the 10-20 International system of electrode placement. Electrical activity was reviewed with band pass filter of 1-70Hz , sensitivity of 7 uV/mm, display speed of 13mm/sec with a 60Hz  notched filter applied as appropriate. EEG data were recorded continuously and digitally stored.  Video monitoring was available and reviewed as appropriate. Description: The posterior dominant rhythm consists of 8 Hz activity of moderate voltage (25-35 uV) seen predominantly in posterior head regions, symmetric and reactive to eye opening and eye closing. Sleep was characterized by vertex waves, sleep spindles (12 to 14 Hz), maximal frontocentral region. EEG showed continuous 3 to 6 Hz theta-delta slowing in right hemisphere, maximal right fronto-temporal region. Hyperventilation and photic stimulation were not performed.   ABNORMALITY - Continuous slow, right hemisphere, maximal right fronto-temporal region IMPRESSION: This study is suggestive of cortical dysfunction arising from right hemisphere, maximal right fronto-temporal region likely secondary to underlying structural abnormality. No seizures or definite epileptiform discharges were seen throughout the recording. [name] Shelton   MR BRAIN WO CONTRAST Result Date: 12/18/2023 CLINICAL DATA:  Provided history: History of stroke, acute worsening of chronic left-sided weakness. Additional history obtained from prior radiology records: History of oligodendroglioma. EXAM: MRI HEAD WITHOUT CONTRAST TECHNIQUE: Multiplanar, multiecho pulse  sequences of the brain and surrounding structures were obtained without intravenous contrast. COMPARISON:  Non-contrast head CT performed earlier today 12/18/2023. Non-contrast head CT and CT angiogram head/neck 12/09/2023. Prior brain MRI examinations 12/09/2023 and earlier. FINDINGS: Brain: Stable cerebral volume. New from the recent prior brain MRI 12/09/2023, there is a focus of patchy restricted diffusion within the right basal ganglia measuring 3.2 x 1.3 cm. There is also a new punctate focus of restricted diffusion within the right caudate head (series 2, image 25). These foci are favored to reflect acute infarcts. The small focus of restricted diffusion demonstrated within the superior right temporal lobe on the prior brain MRI is no longer present. Otherwise stable signal abnormality within the right frontal lobe/insula, extending to the right temporal lobe, region of the right deep gray nuclei, right periatrial white matter, anterior body/genu of corpus callosum and slightly into the adjacent anterior left frontal lobe white matter. Superimposed patchy and irregular foci of diffusion-weighted signal abnormality at these sites are otherwise unchanged and these findings are consistent with a combination of tumor and post-treatment changes. Associated foci of dystrophic calcification and likely chronic hemosiderin deposition. Unchanged focus of chronic encephalomalacia/gliosis within the anterolateral right frontal lobe/insula. Redemonstrated tract (with associated chronic hemosiderin deposition) in the right frontal lobe, presumably from prior biopsy. Ex vacuo dilatation of the right lateral ventricle. Unchanged chronic T2 FLAIR hyperintense signal changes within the pons, nonspecific No extra-axial fluid collection. No midline shift. Vascular: Maintained flow voids within the proximal large arterial vessels. Skull and upper cervical spine: No focal worrisome  marrow lesion. Sinuses/Orbits: No mass or acute  finding within the imaged orbits. Mucosal thickening within the bilateral frontal sinuses (mild right, moderate left). Severe bilateral ethmoid sinusitis. Mild mucosal thickening within the bilateral maxillary sinuses. Other: Trace fluid within left mastoid air cells. IMPRESSION: 1. 3.2 x 1.3 cm focus of patchy restricted diffusion within the right basal ganglia, new from the recent prior brain MRI of 12/09/2023. New punctate focus of restricted diffusion within the right caudate head. Given the appearance and rapid development, these findings are strongly favored to reflect acute infarcts. However, a short-interval follow-up brain MRI (in 6 weeks) is recommended to exclude any tumor progression. 2. A small focus of restricted diffusion demonstrated within the right temporal lobe on the prior MRI is no longer present. This likely reflected an acute infarct on the prior exam. 3. Otherwise stable appearance of the patient's known tumor and post-treatment changes. 4. Paranasal sinus disease as described. Electronically Signed   By: Rockey Childs D.O.   On: 12/18/2023 16:47   CT HEAD WO CONTRAST Result Date: 12/18/2023 CLINICAL DATA:  Neuro deficit, acute, stroke suspected. History of oligodendroglioma status post treatment. EXAM: CT HEAD WITHOUT CONTRAST TECHNIQUE: Contiguous axial images were obtained from the base of the skull through the vertex without intravenous contrast. RADIATION DOSE REDUCTION: This exam was performed according to the departmental dose-optimization program which includes automated exposure control, adjustment of the mA and/or kV according to patient size and/or use of iterative reconstruction technique. COMPARISON:  CT of the head dated December 09, 2023. FINDINGS: Brain: Moderate generalized is cerebral volume loss. Curvilinear areas of calcification are again demonstrated within the periventricular and subcortical white matter of the right frontal lobe. Dystrophic calcifications are also seen  anteromedial to the atrium of the right lateral ventricle. There are encephalomalacia changes and ex vacuo dilatation of the right lateral ventricle, which appears similar to the prior exam. There is no evidence of acute hemorrhage or infarction. Vascular: Moderate calcific atheromatous disease within the carotid siphons and right vertebral artery. Skull: Right frontal burr craniotomy defects. The calvaria is otherwise intact. No osseous lesions present. Sinuses/Orbits: Moderate mucosal disease within the frontal, ethmoid and maxillary sinuses. The orbits are unremarkable. Other: None. IMPRESSION: 1. Stable encephalomalacia changes and dystrophic calcification within the right cerebral hemisphere, compatible with previously treated neoplasm. No adverse interval change. 2. Mild-to-moderate paranasal sinus disease. Electronically Signed   By: [name] M.D.   On: 12/18/2023 15:37   DG Swallowing Func-Speech Pathology Result Date: 12/10/2023 Table formatting from the original result was not included. Modified Barium Swallow Study Patient Details Name: ZELMA MAZARIEGO MRN: Wise Date of Birth: 03/15/1971 Today's Date: 12/10/2023 HPI/PMH: HPI: Per MD progress note, FLAY GHOSH is a 53 y.o. Caucasian male with medical history significant for oligodendroglioma, GERD, type 2 diabetes mellitus, dyslipidemia and urolithiasis, presented to the emergency room with acute onset of altered mental status and recently diminished appetite over the last 4 days.  He saw his PCP earlier this week and had an MRI as he has been having slowly increasing weakness and difficulty with his speech which has been more difficult to understand.  He has not been ambulating as usual.  He has been more fatigued and tired and somnolent. MRI 12/09/23: New punctate focus of restricted diffusion in the anterior right  temporal lobe could represent a small acute infarct (favored) or  new/progressive small site of tumor.  2.  Otherwise, unchanged appearance of multifocal signal abnormality  restricted diffusion  in the right frontal and right temporal lobes  and corpus callosum, compatible with known tumor. CXR 6/17: No active disease. Clinical Impression: Clinical Impression: Pt seen for MBSS demonstrating mild pharyngoesophageal phase dysphagia. Pt presents with improvement in pharyngeal/esophageal phase of swallow in comparison with MBSS completed in March 2025. This date pt with continued residue at the UES and reduced distention of UES coupled with retention in the esophagus for more viscous substances- though no retrograde movement. Residue spilling into pyriform sinus from the UES space, though not continuing to the laryngeal vestibule. No overt airway compromise across consistencies. Cough noted x1 without evidence of penetration/aspiration. Oral phase grossly intact.     Despite lack of aspiration during study- given known choking and mild deficits related to UES distention/residue and reflux/retention- pt is at increased risk for aspiration, specifically with substances of increased viscosity. Trials of liquid wash and double swallow aided to reduce residue.     Continue to recommend dys 2 (chopped) solids to aid clearance and use of compensatory strategies- double swallow and liquid wash. Recommend aspiration precautions- slow rate, small bites, elevated HOB (for at least 30 min after swallow), and alert for PO intake. Pt and spouse aware of recommendations. RN and MD notified of results. Recommend follow up with OP SLP services (pt is currently on OP caseload). Factors that may increase risk of adverse event in presence of aspiration [name] & [name] 2021): Factors that may increase risk of adverse event in presence of aspiration [name] & [name] 2021): Frail or deconditioned; Limited mobility (min deconditioned) Recommendations/Plan: Swallowing Evaluation Recommendations Swallowing Evaluation Recommendations  Recommendations: PO diet PO Diet Recommendation: Dysphagia 2 (Finely chopped); Thin liquids (Level 0) Liquid Administration via: Cup; Straw Medication Administration: Whole meds with liquid Supervision: Patient able to self-feed; Intermittent supervision/cueing for swallowing strategies Swallowing strategies  : Slow rate; Small bites/sips; Follow solids with liquids Postural changes: Position pt fully upright for meals; Stay upright 30-60 min after meals Oral care recommendations: Oral care BID (2x/day) Treatment Plan Treatment Plan Treatment recommendations: Defer treatment plan to SLP at other venue (see follow-up recommendations) (follow up with OP SLP) Follow-up recommendations: Follow physicians's recommendations for discharge plan and follow up therapies (return to OP SLP services) Functional status assessment: -- (pt appears to be approaching baseline) Recommendations Recommendations for follow up therapy are one component of a multi-disciplinary discharge planning process, led by the attending physician.  Recommendations may be updated based on patient status, additional functional criteria and insurance authorization. Assessment: Orofacial Exam: Orofacial Exam Oral Cavity: Oral Hygiene: WFL Oral Cavity - Dentition: Adequate natural dentition Orofacial Anatomy: WFL Oral Motor/Sensory Function: WFL (pt indicating residual left orofacial weakness) Anatomy: Anatomy: WFL Boluses Administered: Boluses Administered Boluses Administered: Thin liquids (Level 0); Mildly thick liquids (Level 2, nectar thick); Puree; Solid  Oral Impairment Domain: Oral Impairment Domain Lip Closure: No labial escape Tongue control during bolus hold: Escape to lateral buccal cavity/floor of mouth Bolus preparation/mastication: Timely and efficient chewing and mashing Bolus transport/lingual motion: Brisk tongue motion Oral residue: Trace residue lining oral structures Location of oral residue : Tongue Initiation of pharyngeal swallow :  Posterior angle of the Wise  Pharyngeal Impairment Domain: Pharyngeal Impairment Domain Soft palate elevation: No bolus between soft palate (SP)/pharyngeal wall (PW) Laryngeal elevation: Complete superior movement of thyroid  cartilage with complete approximation of arytenoids to epiglottic petiole Anterior hyoid excursion: Complete anterior movement Epiglottic movement: Complete inversion Laryngeal vestibule closure: Complete, no air/contrast in laryngeal vestibule Pharyngeal stripping wave : Present -  complete Pharyngeal contraction (A/P view only): N/A Pharyngoesophageal segment opening: Partial distention/partial duration, partial obstruction of flow Tongue base retraction: No contrast between tongue base and posterior pharyngeal wall (PPW) Pharyngeal residue: Trace residue within or on pharyngeal structures Location of pharyngeal residue: Pyriform sinuses (trace)  Esophageal Impairment Domain: Esophageal Impairment Domain Esophageal clearance upright position: Esophageal retention Pill: Pill Consistency administered: -- (n/a) Penetration/Aspiration Scale Score: Penetration/Aspiration Scale Score 1.  Material does not enter airway: Thin liquids (Level 0); Mildly thick liquids (Level 2, nectar thick); Puree; Solid Compensatory Strategies: Compensatory Strategies Compensatory strategies: No   General Information: Caregiver present: No  Diet Prior to this Study: Thin liquids (Level 0); Dysphagia 2 (finely chopped)   Temperature : Normal   Respiratory Status: WFL   Supplemental O2: None (Room air)   History of Recent Intubation: No  Behavior/Cognition: Alert; Cooperative Self-Feeding Abilities: Able to self-feed Baseline vocal quality/speech: Normal Volitional Cough: Able to elicit Volitional Swallow: Able to elicit Exam Limitations: No limitations Goal Planning: Prognosis for improved oropharyngeal function: -- (defer until completion of MBSS) No data recorded No data recorded Patient/Family Stated Goal: to eat as  able Consulted and agree with results and recommendations: Patient; Family member/caregiver Pain: Pain Assessment Pain Assessment: No/denies pain End of Session: Start Time:SLP Start Time (ACUTE ONLY): 1150 Stop Time: SLP Stop Time (ACUTE ONLY): 1235 Time Calculation:SLP Time Calculation (min) (ACUTE ONLY): 45 min Charges: SLP Evaluations $ SLP Speech Visit: 1 Visit SLP Evaluations $BSS Swallow: 1 Procedure $MBS Swallow: 1 Procedure SLP visit diagnosis: SLP Visit Diagnosis: Dysphagia, pharyngoesophageal phase (R13.14) Past Medical History: Past Medical History: Diagnosis Date  Allergy   Complication of anesthesia   pt reports he is starting to have more difficulty arousing after surgery  Diabetes mellitus (HCC)   GERD (gastroesophageal reflux disease)   Headache   History of kidney stones   Hyperlipidemia   Renal disorder   kidney stones Past Surgical History: Past Surgical History: Procedure Laterality Date  APPLICATION OF CRANIAL NAVIGATION Right 01/17/2021  Procedure: APPLICATION OF CRANIAL NAVIGATION;  Surgeon: Debby Dorn MATSU, MD;  [name];  Service: Neurosurgery;  Laterality: Right;  COLONOSCOPY  09/03/1994  COLONOSCOPY WITH PROPOFOL  N/A 01/07/2023  Procedure: COLONOSCOPY WITH PROPOFOL ;  Surgeon: Jinny Carmine, MD;  Location: [facility] ENDOSCOPY;  Service: Endoscopy;  Laterality: N/A;  NOT TOO EARLY  CYSTOSCOPY WITH STENT PLACEMENT Right 01/25/2016  Procedure: CYSTOSCOPY WITH STENT PLACEMENT;  Surgeon: Redell Lynwood Napoleon, MD;  Location: [facility] ORS;  Service: Urology;  Laterality: Right;  FRAMELESS  BIOPSY WITH BRAINLAB Right 01/17/2021  Procedure: RIGHT STEREOTACTIC BIOPSY OF INSULAR LESION;  Surgeon: Debby Dorn MATSU, MD;  Location: [facility] OR;  Service: Neurosurgery;  Laterality: Right;  KNEE ARTHROSCOPY W/ MENISCAL REPAIR Right 06/23/1988  POLYPECTOMY  01/07/2023  Procedure: POLYPECTOMY;  Surgeon: Jinny Carmine, MD;  Location: [facility] END[facility]COPY;  Service: Endoscopy;;  removal of birthmark  06/08/2013  SKIN CANCER  EXCISION  06/23/2012  TYMPANOSTOMY TUBE PLACEMENT  08/06/1978  URETEROSCOPY WITH HOLMIUM LASER LITHOTRIPSY Right 01/25/2016  Procedure: URETEROSCOPY WITH HOLMIUM LASER LITHOTRIPSY;  Surgeon: Redell Lynwood Napoleon, MD;  Location: [facility] ORS;  Service: Urology;  Laterality: Right;  URETHRAL STRICTURE DILATATION    visual inspection of vocal cord    1973  WISDOM TOOTH EXTRACTION   [place] Jarrett Clapp, MS, CCC-SLP Speech Language Pathologist Rehab Services; [facility] [phone] (ascom) [place] J Clapp 12/10/2023, 12:54 PM  CT ANGIO HEAD NECK W WO CM Result Date: 12/09/2023 CLINICAL DATA:  Stroke/TIA, determine embolic  source. Altered mental status, behavioral change since yesterday, increased slurred speech and gait changes. EXAM: CT ANGIOGRAPHY HEAD AND NECK WITH AND WITHOUT CONTRAST TECHNIQUE: Multidetector CT imaging of the head and neck was performed using the standard protocol during bolus administration of intravenous contrast. Multiplanar CT image reconstructions and MIPs were obtained to evaluate the vascular anatomy. Carotid stenosis measurements (when applicable) are obtained utilizing NASCET criteria, using the distal internal carotid diameter as the denominator. RADIATION DOSE REDUCTION: This exam was performed according to the departmental dose-optimization program which includes automated exposure control, adjustment of the mA and/or kV according to patient size and/or use of iterative reconstruction technique. CONTRAST:  75mL OMNIPAQUE  IOHEXOL  350 MG/ML SOLN COMPARISON:  MRI head earlier same day. FINDINGS: CT HEAD FINDINGS Brain: No acute intracranial hemorrhage. Heterogeneous areas of mineralization within the right frontal lobe with associated edema corresponding to findings of tumor on same day MRI. Similar focus of mineralization and edema within the right temporal periventricular white matter. Nonspecific hypoattenuation in the periventricular and subcortical white matter favored to reflect  chronic microvascular ischemic changes. Generalized parenchymal volume loss. The basilar cisterns are patent. Posterior fossa is unremarkable. Ventricles: Ventricles are normal in size and configuration. Vascular: No hyperdense vessel. Intracranial atherosclerotic calcifications noted. Skull: No acute or aggressive finding. Sinuses/orbits: Orbits are symmetric. Mucosal thickening in the ethmoid sinuses, left greater than right. Other: Mastoid air cells are clear. CTA NECK FINDINGS Aortic arch: Standard configuration of the aortic arch. Imaged portion shows no evidence of aneurysm or dissection. No significant stenosis of the major arch vessel origins. Pulmonary arteries: As permitted by contrast timing, there are no filling defects in the visualized pulmonary arteries. Subclavian arteries: The subclavian arteries are patent bilaterally. Right carotid system: Patent. Mild atherosclerosis at the carotid bifurcation without hemodynamically significant stenosis. No evidence of dissection. Left carotid system: Patent. Mild atherosclerosis at the carotid bifurcation without hemodynamically significant stenosis. No evidence of dissection. Vertebral arteries: Codominant. No evidence of dissection, stenosis (50% or greater), or occlusion. Atherosclerosis at the left vertebral artery origin resulting in mild stenosis. Skeleton: No acute or aggressive finding noted. Other neck: The visualized airway is patent. No cervical lymphadenopathy. Upper chest: Visualized lung apices are clear. Review of the MIP images confirms the above findings CTA HEAD FINDINGS ANTERIOR CIRCULATION: The intracranial ICAs are patent bilaterally. Mild atherosclerosis of the carotid siphons. No significant stenosis, proximal occlusion, aneurysm, or vascular malformation. MCAs: Patent bilaterally. Mild to moderate multifocal narrowing of an M2 inferior division branch of the right MCA. ACAs: Patent bilaterally. 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 There is severe narrowing of the A3 and proximal A4 segments of the right ACA. POSTERIOR CIRCULATION: No significant stenosis, proximal occlusion, aneurysm, or vascular malformation. PCAs: The PCAs are patent proximally. Fetal origin of both PCAs. There is severe stenosis and occlusion of the P3 segment of the right PCA. Few small caliber distal PCA branches noted likely perfused by collaterals. Pcomm: The posterior communicating arteries are visualized bilaterally. SCAs: The superior cerebellar arteries are patent bilaterally. Basilar artery: Patent.  Distal tapering of the basilar artery. AICAs: Not well visualized. PICAs: Patent Vertebral arteries: The intracranial vertebral arteries are patent. Venous sinuses: As permitted by contrast timing, patent. Anatomic variants: None Review of the MIP images confirms the above findings IMPRESSION: No CT evidence of acute intracranial abnormality. Heterogeneous areas of mineralization and surrounding edema in the right frontal lobe and right temporal periventricular white matter corresponding to regions of tumor noted  on same day MRI. 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 Intracranial arterial vasculature is otherwise patent. 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 Mild-to-moderate multifocal stenosis of an M2 inferior division branch of the right MCA. No high-grade stenosis of the arteries in the neck. Electronically Signed   By: Donnice Mania M.D.   On: 12/09/2023 13:48   MR Brain W and Wo Contrast Result Date: 12/09/2023 CLINICAL DATA:  Delirium had MRI about 1 week ago, since then has had increased difficulty speaking, R leg weakness, increased somnolence. eval for stroke, edema, tumor progression etc EXAM: MRI HEAD WITHOUT AND WITH CONTRAST TECHNIQUE: Multiplanar, multiecho pulse sequences of the brain and surrounding structures were obtained without and with intravenous contrast.  CONTRAST:  6mL GADAVIST  GADOBUTROL  1 MMOL/ML IV SOLN COMPARISON:  MRI head December 03, 2023. FINDINGS: Brain: New punctate focus of restricted diffusion in the anterior right temporal lobe (series 5, image 28). Resolution of the previously seen small focus of restricted diffusion in the right corona radiata. Additional areas of masslike restricted diffusion and signal abnormality involving the right frontal lobe, genu of the corpus callosum extending into the left frontal lobe, and right periatrial region, compatible with known tumor. More facet areas of T1 hyperintensity in the right frontal lobe right periatrial region are unchanged. No substantial mass effect or midline shift. No hydrocephalus. Similar cerebral atrophy. Vascular: Major arterial flow voids are maintained. Skull and upper cervical spine: Normal marrow signal. Sinuses/Orbits: Negative. IMPRESSION: 1. New punctate focus of restricted diffusion in the anterior right temporal lobe could represent a small acute infarct (favored) or new/progressive small site of tumor. 2. Otherwise, unchanged appearance of multifocal signal abnormality restricted diffusion in the right frontal and right temporal lobes and corpus callosum, compatible with known tumor. Electronically Signed   By: Gilmore GORMAN Molt M.D.   On: 12/09/2023 01:17   CT Head Wo Contrast Result Date: 12/08/2023 CLINICAL DATA:  Delirium altered EXAM: CT HEAD WITHOUT CONTRAST TECHNIQUE: Contiguous axial images were obtained from the base of the skull through the vertex without intravenous contrast. RADIATION DOSE REDUCTION: This exam was performed according to the departmental dose-optimization program which includes automated exposure control, adjustment of the mA and/or kV according to patient size and/or use of iterative reconstruction technique. COMPARISON:  MRI 12/03/2023, CT brain 09/09/2023 FINDINGS: Brain: Amorphous hyperdensity in the right frontal and temporal lobes, progressive compared  with head CT from March and favored to represent mineralization over hemorrhage, and corresponding to history of known brain lesions. White matter hypodensity again visible in the right frontal and temporal lobes as well as right insula. Areas of cystic encephalomalacia as before. Grossly stable size and configuration of the ventricles with ex vacuo dilatation on the right. Vascular: No hyperdense vessels.  Carotid vascular calcification Skull: No fracture. Sinuses/Orbits: No acute finding. Mild mucosal thickening in the sinuses Other: None IMPRESSION: Amorphous hyperdensity in the right frontal and temporal lobes, progressive compared with head CT from March and favored to represent mineralization over hemorrhage, and corresponding to history of known brain lesions. Other grossly stable areas of white matter hypodensity as demonstrated on recent MRI imaging. Stable ventricle size and morphology compared to prior. Electronically Signed   By: Luke Bun M.D.   On: 12/08/2023 21:58   DG Chest Portable 1 View Result Date: 12/08/2023 CLINICAL DATA:  Altered mental status. EXAM: PORTABLE CHEST 1 VIEW COMPARISON:  September 09, 2023 FINDINGS: The heart size and  mediastinal contours are within normal limits. Both lungs are clear. The visualized skeletal structures are unremarkable. IMPRESSION: No active disease. Electronically Signed   By: Suzen Dials M.D.   On: 12/08/2023 21:30   MR BRAIN W WO CONTRAST Result Date: 12/03/2023 CLINICAL DATA:  Oligodendroglioma status post treatment, assess treatment response EXAM: MRI HEAD WITHOUT AND WITH CONTRAST TECHNIQUE: Multiplanar, multiecho pulse sequences of the brain and surrounding structures were obtained without and with intravenous contrast. CONTRAST:  7mL GADAVIST  GADOBUTROL  1 MMOL/ML IV SOLN COMPARISON:  MRI of the brain dated October 02, 2023. FINDINGS: Brain: There are new foci of increased diffusion signal present within the right corona radiata, insular ribbon  and temporal cortex present on image 29 of series 5. There has been interval normalization of increase diffusion signal and contrast enhancement previously noted within the right cerebral peduncle. There continues to be increased diffusion signal within the right frontal lobe and genu of the corpus callosum. There is persistent amorphous area of increased T1 signal within the right frontal lobe, which appears similar to the prior study. There is moderate generalized cerebral volume loss, which is more pronounced on the right. There is mild-to-moderate periventricular white matter disease, also more pronounced on the right. Vascular: Normal flow voids. Skull and upper cervical spine: Status post right frontal burr craniotomy. Normal bone marrow signal. Sinuses/Orbits: Mild mucosal disease within the ethmoid and maxillary sinuses. The orbits are unremarkable. Other: None. IMPRESSION: 1. New small foci of potential restricted diffusion within the right corona radiata, insular ribbon and temporal cortex. 2. Interval near resolution of restricted diffusion and contrast enhancement within the right cerebral peduncle. 3. Stable appearance of the patient's primary tumor site in the right frontal lobe. Electronically Signed   By: [name] M.D.   On: 12/03/2023 13:23     Time coordinating discharge: Over 30 minutes    Alm Apo, MD  [facility] 12/24/2023, 2:02 PM

## 2023-12-24 NOTE — Progress Notes (Addendum)
 STROKE TEAM PROGRESS NOTE   INTERIM HISTORY/SUBJECTIVE Patient known to stroke team from previous admission for strokes.  Readmitted this time with new left-sided weakness and MRI scan shows a right ACA infarct with CT angiogram showing the right A3 stenosis.  Patient was supposed to have started Eliquis  due to recurrent strokes and failure of aspirin  as per Dr. Eward new oncologist.  Seen in room with wife at the bedside. Plan for discharge to CIR after carotid US  is done. Continue eliquis  and follow up with Dr. Buckley outpatient.   OBJECTIVE  CBC    Component Value Date/Time   WBC 6.9 12/19/2023 0523   RBC 5.06 12/19/2023 0523   HGB 14.7 12/19/2023 0523   HGB 14.9 10/05/2023 0946   HGB 16.4 05/04/2012 1815   HCT 43.5 12/19/2023 0523   HCT 48.4 05/04/2012 1815   PLT 247 12/19/2023 0523   PLT 207 10/05/2023 0946   PLT 261 05/04/2012 1815   MCV 86.0 12/19/2023 0523   MCV 86 05/04/2012 1815   MCH 29.1 12/19/2023 0523   MCHC 33.8 12/19/2023 0523   RDW 12.3 12/19/2023 0523   RDW 12.7 05/04/2012 1815   LYMPHSABS 1.3 12/18/2023 1440   MONOABS 0.4 12/18/2023 1440   EOSABS 0.3 12/18/2023 1440   BASOSABS 0.1 12/18/2023 1440    BMET    Component Value Date/Time   NA 137 12/23/2023 0448   NA 138 05/04/2012 1815   K 4.1 12/23/2023 0448   K 3.5 05/04/2012 1815   CL 102 12/23/2023 0448   CL 99 05/04/2012 1815   CO2 26 12/23/2023 0448   CO2 29 05/04/2012 1815   GLUCOSE 117 (H) 12/23/2023 0448   GLUCOSE 69 05/04/2012 1815   BUN 11 12/23/2023 0448   BUN 12 05/04/2012 1815   CREATININE 0.87 12/23/2023 0448   CREATININE 0.95 08/19/2023 0818   CALCIUM  9.4 12/23/2023 0448   CALCIUM  9.2 05/04/2012 1815   EGFR 97 08/19/2023 0818   GFRNONAA >60 12/23/2023 0448   GFRNONAA >60 05/22/2023 0936   GFRNONAA 106 12/05/2020 1014    IMAGING past 24 hours MR BRAIN WO CONTRAST Result Date: 12/23/2023 CLINICAL DATA:  53 year old male with punctate right temporal lobe infarct Wise month.  Neurologic deficit, right basal ganglia infarct on 12/18/2023. EXAM: MRI HEAD WITHOUT CONTRAST TECHNIQUE: Multiplanar, multiecho pulse sequences of the brain and surrounding structures were obtained without intravenous contrast. COMPARISON:  Brain MRI 12/18/2023 and earlier. FINDINGS: Brain: Progressive restricted diffusion now beginning anterior to the right caudate and continuing through the nearby right ACA territory, confluent involvement along a 4-5 cm segment of the right cingulate (series 3, image 40). Unresolved diffusion restriction previously seen along the right frontal horn, and also in the posterior right corona radiata tracking toward the right lentiform, and also lateral to the atrium of the right lateral ventricle. Also punctate new restricted diffusion in the left lentiform series 3, image 30. T2 and FLAIR hyperintense cytotoxic edema in the affected areas. Petechial hemorrhage on T2* imaging not significantly changed from previous SWI Heidelberg classification 1b: HI2, confluent petechiae, no mass effect. Pre-existing cystic encephalomalacia and hemosiderin along the anterior right MCA division also. Stable T2 and FLAIR hyperintense probable gliosis of the right insula there. Stable encephalomalacia with T2 shine through along the anterior the left genu of the corpus callosum. Chronic brainstem Wallerian degeneration, greater on the right. No midline shift, mass effect, evidence of mass lesion, acute ventriculomegaly, extra-axial collection. Cervicomedullary junction and pituitary are within normal limits. Vascular:  Major intracranial vascular flow voids are stable. Skull and upper cervical spine: Negative. Visualized bone marrow signal is within normal limits. Sinuses/Orbits: Stable paranasal sinus mucosal thickening and opacification. Negative orbits. Other: Mastoids well aerated. IMPRESSION: 1. New acute and confluent mid and distal Right ACA territory infarction. Punctate new left basal ganglia  acute lacunar infarct. And ongoing multifocal right MCA deep white and deep gray matter ischemia otherwise not significantly changed from 12/18/2023. 2. Associated fatigue no hemorrhage. No malignant hemorrhagic transformation. No intracranial mass effect or midline shift. 3. Underlying chronic ischemic disease and encephalomalacia. Electronically Signed   By: VEAR Hurst M.D.   On: 12/23/2023 13:02   Overnight EEG with video Result Date: 12/23/2023 Alan [name], MD     12/23/2023 12:49 PM Patient Name: Alan Wise MRN: [id number] Epilepsy Attending: Arlin Wise Alan Referring Physician/Provider: Judithe Rocky BROCKS, NP Duration: 12/22/2023 2256 to 12/23/2023 9073 Patient history: 53 year old male with altered mental status.  Today had left gaze preference, left hemianopia and facial droop with waxing and waning aphasia and weakness.  EEG to evaluate for seizure. Level of alertness: Awake, asleep AEDs during EEG study: LTG Technical aspects: This EEG study was done with scalp electrodes positioned according to the 10-20 International system of electrode placement. Electrical activity was reviewed with band pass filter of 1-70Hz , sensitivity of 7 uV/mm, display speed of 14mm/sec with a 60Hz  notched filter applied as appropriate. EEG data were recorded continuously and digitally stored.  Video monitoring was available and reviewed as appropriate. Description: The posterior dominant rhythm consists of 8-9 Hz activity of moderate voltage (25-35 uV) seen predominantly in posterior head regions, symmetric and reactive to eye opening and eye closing. Sleep was characterized by vertex waves, sleep spindles (12 to 14 Hz), maximal frontocentral region. EEG showed continuous 3 to 6 Hz theta-delta slowing in right frontotemporal region. Hyperventilation and photic stimulation were not performed.   ABNORMALITY - Continuous slow, right frontotemporal region IMPRESSION: This study is suggestive of cortical dysfunction arising from  right frontotemporal region likely secondary to underlying strokes, postictal state. No seizures or epileptiform discharges were seen throughout the recording. Alan Wise Alan    Vitals:   12/23/23 2031 12/24/23 0015 12/24/23 0333 12/24/23 0727  BP: 132/73 116/80 127/79 139/83  Pulse: 61 64 (!) 59 (!) 58  Resp: 14 14 16 18   Temp: 97.8 F (36.6 C) 98.1 F (36.7 C) 98.4 F (36.9 C) 98.3 F (36.8 C)  TempSrc: Oral Oral Axillary   SpO2: 94% 95% 95% 96%  Weight:      Height:         PHYSICAL EXAM General:  Alert, well-nourished, well-developed patient in no acute distress Psych:  Mood and affect appropriate for situation CV: Regular rate and rhythm on monitor Respiratory:  Regular, unlabored respirations on room air GI: Abdomen soft and nontender   NEURO:  Mental Status: Alert, awake, able to state name and give a limited history Speech/Language: speech is dysarthric  Cranial Nerves:  II: PERRL. Left hemianopia  III, IV, VI: EOMI. Eyelids elevate symmetrically.  V: Sensation is intact to light touch and symmetrical to face.  VII: Left facial droop VIII: hearing intact to voice. IX, X: Palate elevates symmetrically. Phonation is normal.  KP:Dynloizm shrug 5/5. XII: tongue is midline without fasciculations. Motor:  Spastic left hemiparesis with left upper extremity 4/5 and left lower extremity 3/5 strength with weakness of left hip flexors ankle dorsiflexors and left hand muscles. Tone: is normal and bulk is  normal Sensation- Intact to light touch bilaterally. Extinction absent to light touch to DSS.   Coordination: FTN intact bilaterally, HKS: no ataxia in BLE.No drift.  Gait- deferred  ASSESSMENT/PLAN  Mr. ADELL PANEK is a 53 y.o. 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 No progressive mass effect or acute hemorrhage. Similar appearance of treated tumor with dystrophic calcification 7/2- MRI  New acute and confluent mid and distal Right ACA territory infarction. Punctate new left basal ganglia acute lacunar infarct and ongoing multifocal right MCA deep white and deep gray matter ischemia otherwise not significantly changed from 12/18/2023. 6/27 MRI - 3.2 x 1.3 cm focus of patchy restricted diffusion within the right basal ganglia, new from the recent prior brain MRI of 12/09/2023. New punctate focus of restricted diffusion within the right caudate head. Given the appearance and rapid development, these findings are strongly favored to reflect acute infarcts. 6/18 MRI - New punctate focus of restricted diffusion in the anterior right temporal lobe could represent a small acute infarct (favored) or new/progressive small site of tumor. Otherwise, unchanged appearance of multifocal signal abnormality restricted diffusion in the right frontal and right temporal lobes and corpus callosum, compatible with known tumor. 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    Hypertension Home meds:  Norvasc , propanolol Stable Blood Pressure Goal:  BP less than 220/110   Hyperlipidemia LDL 129, goal < 70 Add Atorvastatin  40mg  Continue statin at discharge  Dysphagia Patient has post-stroke dysphagia, SLP consulted    Diet   DIET DYS 3 Room service appropriate? Yes; Fluid consistency: Thin   Advance diet as tolerated  Other Active Problems Seizure hx- home meds - lamictal    Hospital day # 6  Patient seen and examined by NP/APP with MD. MD to update note as needed.   Alan Last, DNP, FNP-BC [facility] Pager: [phone]  I have personally obtained history,examined this patient, reviewed notes, independently viewed imaging studies, participated in medical decision making and plan of care.ROS completed by me personally and pertinent positives fully documented  I have made any additions or clarifications directly to the above note. Agree with note above.  Patient presented with new right ACA infarct and etiology is unclear as to related to hypercoagulability from his oligodendroglioma s/p radiation and previous usage of  avastin .  He has failed antiplatelet therapy and is not unreasonable to consider a trial of anticoagulation with Eliquis  patient may be a fall risk due to his hemiparesis.  Check carotid ultrasound transfer to inpatient rehab for further therapy and rehabilitation needs.  Long discussion with patient and wife and answered questions.  Greater than  50% time during this 50-minute visit was spent in counseling and coordination of care and discussion patient care team and answering questions.  Discussed with Dr. Patsy Eather Popp, MD Medical Director [facility] Stroke [facility] Pager: [phone] 12/24/2023 2:06 PM  To contact Stroke Continuity provider, please refer to [email or web address]. After hours, contact General Neurology

## 2023-12-24 NOTE — Progress Notes (Signed)
 Transported via bed and staff x2 to CT Scan per orders after s/p unwitnessed fall., closely monitored, denies pain

## 2023-12-24 NOTE — TOC Transition Note (Signed)
 Transition of Care [facility]) - Discharge Note   Patient Details  Name: Alan Wise MRN: [id number] Date of Birth: October 19, 1970  [facility] of [facility]) CM/SW Contact:  Andrez JULIANNA George, RN Phone Number: 12/24/2023, 2:17 PM   Clinical Narrative:     Pt is discharging to CIR today. CM signing off.   Final next level of care: IP Rehab Facility Barriers to Discharge: No Barriers Identified   Patient Goals and CMS Choice   CMS Medicare.gov Compare Post Acute Care list provided to:: Patient Choice offered to / list presented to : Patient      Discharge Placement                       Discharge Plan and Services Additional resources added to the After Visit Summary for                                       Social Drivers of Health (SDOH) Interventions SDOH Screenings   Food Insecurity: No Food Insecurity (12/18/2023)  Housing: Low Risk  (12/18/2023)  Transportation Needs: No Transportation Needs (12/18/2023)  Utilities: Not At Risk (12/18/2023)  Alcohol Screen: Low Risk  (05/01/2022)  Depression (PHQ2-9): Medium Risk (11/17/2023)  Financial Resource Strain: Low Risk  (12/05/2022)   Received from [facility]  Social Connections: Moderately Isolated (12/09/2023)  Tobacco Use: Low Risk  (12/18/2023)     Readmission Risk Interventions     No data to display

## 2023-12-24 NOTE — Progress Notes (Signed)
 PT Dc'ing to CIR. Report called. Awaiting tech to wheel up. VSS. PT and family aware

## 2023-12-24 NOTE — Progress Notes (Signed)
 Speech Language Pathology Treatment: Dysphagia;Cognitive-Linguistic  Patient Details Name: Alan Wise MRN: [id number] DOB: 1970-12-03 Today's Date: 12/24/2023 Time: 8964-8882 SLP Time Calculation (min) (ACUTE ONLY): 42 min  Assessment / Plan / Recommendation Clinical Impression  Swallowing:  SLP followed up to review MBSS results and recommendations with pt and family. All questions answered within SLP scope of practice for dysphagia. Strongly recommend GI consult given moderate retention of solids with aspiration of thin liquids that followed bite of solid. SLP monitored pt's tolerance of alternating bites/sips of thin liquids (cup) and cracker with x1 cough observed post thin liquids - likely secondary to airway violation given MBSS results. Fortunately, pt does not have concerns for development of pneumonia at this time.   Language: SLP facilitated receptive language tasks with Body ID and simple 1-step commands. Pt able to identify ~75% of targeted body parts (mixed up eye and ear). He was able to follow ~60% of commands given initial direction. Expressive language continues to be mostly a perseveration of generalized phrases like that thing and then this with some intermittent logical and appropriate phrase responses.   SLP will continue to follow to address language and swallowing goals. Continue to recommend high intensity SLP services x5 days per week.    HPI HPI: 53 y.o. male presents to [facility] 12/18/23 after falling two days prior with residual L sided weakness and somnolence. Brain MRI showed new patchy restricted diffusion in R basal ganglia, new punctuate focus of restricted diffusion in R caudate head. Recent admit 6/17-6/19 with anterior R temporal lobe punctate restricted diffusion. PMHx: T2DM, GERD, CVA w/ L sided weakness, oligodendroglioma      SLP Plan  Continue with current plan of care          Recommendations  Diet recommendations: Dysphagia 3 (mechanical  soft);Thin liquid Liquids provided via: No straw;Cup Medication Administration: Whole meds with puree Supervision: Patient able to self feed Compensations: Minimize environmental distractions;Slow rate;Small sips/bites;Follow solids with liquid Postural Changes and/or Swallow Maneuvers: Seated upright 90 degrees;Upright 30-60 min after meal                  Oral care BID   Frequent or constant Supervision/Assistance Aphasia (R47.01);Cognitive communication deficit (R41.841);Dysphagia, pharyngoesophageal phase (R13.14)     Continue with current plan of care     Alan Wise  12/24/2023, 12:18 PM

## 2023-12-24 NOTE — Progress Notes (Signed)
 PMR Admission Coordinator Pre-Admission Assessment   Patient: Alan Wise is an 53 y.o., male MRN: [id number] DOB: Nov 01, 1970 Height: 5' 8 (172.7 cm) Weight: 63.5 kg   Insurance Information HMO:     PPO:      PCP:      IPA:      80/20:      OTHER: Choice POS II PRIMARY: [employer]     Policy#: [id number]      Subscriber: patient CM Name: Geni      Phone#: [phone]     Fax#: [phone] Pre-Cert#: [id number] approval from [place] for admit 12/22/23 to 12/28/23 with update due on 12/29/23     Employer: [employer] Benefits:  Phone #: [phone]     Name:  Eff. Date: 06/24/23-still active     Deduct: $1,250 ($1,250 met)      Out of Pocket Max: $4,890 ($4,890 met)      Life Max: NA CIR: $300 co-pay/admission, then 80% coverage, 20% co-insurance      SNF: 80% coverage, 20% co-insurance Outpatient: $0-$52 co-pay/visit     Co-Pay:  Home Health: 80% coverage      Co-Pay: 20% co-insurance DME: 80% coverage     Co-Pay: 20% co-insurance Providers: in-network  SECONDARY:       Policy#:      Phone#:    Artist:       Phone#:    The Data processing manager" for patients in Inpatient Rehabilitation Facilities with attached "Privacy Act Statement-Health Care Records" was provided and verbally reviewed with: Patient and Family   Emergency Contact Information Contact Information       Name Relation Home Work Mobile    Faw,[name] Spouse     [phone]    Zirbes,[name] Mother [phone]   [phone]         Other Contacts   None on File        Current Medical History  Patient Admitting Diagnosis: R BG CVA   History of Present Illness: Pt is a 53 year old male with medical hx significant for: CVA with residual left-sided deficits, oligodendroglioma s/p radiation, DM II, hyperlipidemia, GERD. Pt presented to [facility] on 12/18/23 d/t focal weakness x 3 days prior. Pt dragging left leg. Pt had a fall 2 days prior to presentation.  Recently hospitalized from 6/17-6/19 d/t anterior right temporal lobe punctate restricted diffusion. 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 Buckley. MRI revealed patchy restricted diffusion within right basal ganglia. New punctate focus of restricted diffusion within right caudate head. Neurology consulted. EEG on 6/28 was suggestive of cortical dysfunction arising from right hemisphere.  Therapy evaluations completed and CIR recommended d/t pt's deficits in functional mobility. Developed new neurological symptoms on 12/22/23.  Zmri on 12/23/23 showed new acute and confluent mid and distal Right ACA territory infarction.  Punctate new left basal ganglia acute lacunar infarct.  And ongoing multifocal right MCA deep white and deep gray matter ischemia otherwise not significantly changed from 12/18/2023.  LTM EEG was completed.  MBS scheduled for 12/24/23.  Possible carotid U/S pending 12/24/23.   Complete NIHSS TOTAL: 8   Patient's medical record from [facility] has been reviewed by the rehabilitation admission coordinator and physician.   Past Medical History      Past Medical History:  Diagnosis Date   Allergy     Complication of anesthesia  pt reports he is starting to have more difficulty arousing after surgery   Diabetes mellitus (HCC)     GERD (gastroesophageal reflux disease)     Headache     History of kidney stones     Hyperlipidemia     Renal disorder      kidney stones          Has the patient had major surgery during 100 days prior to admission? No   Family History   family history includes Benign prostatic hyperplasia in his father; Diabetes in his father and maternal grandmother; Hyperlipidemia in his father; Hypertension in his father and paternal grandmother; Stroke in his maternal grandmother and paternal grandmother.   Current Medications  Current Medications    Current  Facility-Administered Medications:    acetaminophen  (TYLENOL ) tablet 650 mg, 650 mg, Oral, Q6H PRN **OR** acetaminophen  (TYLENOL ) suppository 650 mg, 650 mg, Rectal, Q6H PRN, Arrien, Elidia Sieving, MD   amLODipine  (NORVASC ) tablet 5 mg, 5 mg, Oral, Daily, Arrien, Mauricio Daniel, MD, 5 mg at 12/24/23 1014   apixaban  (ELIQUIS ) tablet 5 mg, 5 mg, Oral, BID, Arrien, Mauricio Daniel, MD, 5 mg at 12/24/23 1018   atorvastatin  (LIPITOR) tablet 40 mg, 40 mg, Oral, Daily, Arrien, Mauricio Daniel, MD, 40 mg at 12/24/23 1018   feeding supplement (ENSURE PLUS HIGH PROTEIN) liquid 237 mL, 237 mL, Oral, BID BM, Arrien, Mauricio Daniel, MD, 237 mL at 12/24/23 1037   lamoTRIgine  (LAMICTAL ) tablet 150 mg, 150 mg, Oral, Daily, 150 mg at 12/24/23 1016 **AND** lamoTRIgine  (LAMICTAL ) tablet 200 mg, 200 mg, Oral, QHS, Bhagat, Srishti L, MD, 200 mg at 12/23/23 2143   ondansetron  (ZOFRAN ) tablet 4 mg, 4 mg, Oral, Q6H PRN **OR** ondansetron  (ZOFRAN ) injection 4 mg, 4 mg, Intravenous, Q6H PRN, Arrien, Mauricio Daniel, MD   polyethylene glycol (MIRALAX  / GLYCOLAX ) packet 17 g, 17 g, Oral, Daily PRN, Arrien, Mauricio Daniel, MD   propranolol  (INDERAL ) tablet 20 mg, 20 mg, Oral, BID, Arrien, Mauricio Daniel, MD, 20 mg at 12/23/23 2143     Patients Current Diet:  Diet Order                  DIET DYS 3 Room service appropriate? Yes; Fluid consistency: Thin  Diet effective now                         Precautions / Restrictions Precautions Precautions: Fall Precaution/Restrictions Comments: L visual field cut at baseline Restrictions Weight Bearing Restrictions Per Provider Order: No Other Position/Activity Restrictions: L visual field cut at baseline    Has the patient had 2 or more falls or a fall with injury in the past year? Yes   Prior Activity Level Community (5-7x/wk): therapy 3 days/week   Prior Functional Level Self Care: Did the patient need help bathing, dressing, using the toilet or eating?  Independent   Indoor Mobility: Did the patient need assistance with walking from room to room (with or without device)? Independent   Stairs: Did the patient need assistance with internal or external stairs (with or without device)? Independent   Functional Cognition: Did the patient need help planning regular tasks such as shopping or remembering to take medications? Needed some help   Patient Information Are you of Hispanic, Latino/a,or Spanish origin?: X. Patient unable to respond, A. No, not of Hispanic, Latino/a, or Spanish origin What is your race?: X. Patient unable to respond, A. White Do you need or want an interpreter to  communicate with a doctor or health care staff?: 9. Unable to respond   Patient's Response To:  Health Literacy and Transportation Is the patient able to respond to health literacy and transportation needs?: No Health Literacy - How often do you need to have someone help you when you read instructions, pamphlets, or other written material from your doctor or pharmacy?: Patient unable to respond   Home Assistive Devices / Equipment Home Equipment: Shower seat - built in, Hand held shower head   Prior Device Use: Indicate devices/aids used by the patient prior to current illness, exacerbation or injury? None of the above   Current Functional Level Cognition   Arousal/Alertness: Awake/alert Overall Cognitive Status: Impaired/Different from baseline Orientation Level: Oriented to person, Oriented to place, Disoriented to time    Extremity Assessment (includes Sensation/Coordination)   Upper Extremity Assessment: Defer to OT evaluation LUE Deficits / Details: LUE with development of strong flexor tone, Lt hand contracting into flexion and scapula noted to not glide fluidily AROM shoulder < 50 deg which is 20 less compared to his IE. pain with AAROM/PROM of the shoulder. 2/4MAS elbow flexors LUE: Shoulder pain with ROM LUE Sensation: WNL LUE Coordination:  decreased fine motor, decreased gross motor  Lower Extremity Assessment: RLE deficits/detail, LLE deficits/detail RLE Deficits / Details: Hip flexion 4+/5, knee ext 5/5, ankle DF 4+/5 RLE Sensation: WNL RLE Coordination: WNL LLE Deficits / Details: Hip flexion 4/5, knee ext 4/5, limited ankle DF ROM with 4/5 for MMT LLE Sensation: WNL LLE Coordination: WNL     ADLs   Overall ADL's : Needs assistance/impaired Eating/Feeding: Set up, Sitting Grooming: Sitting, Wash/dry hands, Contact guard assist Upper Body Bathing: Sitting, Set up Lower Body Bathing: Sitting/lateral leans, Set up Upper Body Dressing : Sitting, Moderate assistance Lower Body Dressing: Sitting/lateral leans, Moderate assistance Toilet Transfer: Minimal assistance, Ambulation Toilet Transfer Details (indicate cue type and reason): using toilet like urinal Toileting- Clothing Manipulation and Hygiene: Sit to/from stand, Minimal assistance Functional mobility during ADLs: Minimal assistance (HHA) General ADL Comments: Focused session on addressing tone with mulitiple manual techniques, explained to pt what tone is and how it is restricing him. also worked no the scapula.     Mobility   Overal bed mobility: Needs Assistance Bed Mobility: Supine to Sit, Sit to Supine Supine to sit: Contact guard, Used rails Sit to supine: Min assist, Used rails General bed mobility comments: increased time and min A for trunk elevation. Cues to bring LLE off the EOB due to inattention     Transfers   Overall transfer level: Needs assistance Equipment used: 1 person hand held assist Transfers: Sit to/from Stand Sit to Stand: Contact guard assist General transfer comment: min A from EOB and CGA from low toilet seat with cues for hand placement     Ambulation / Gait / Stairs / Wheelchair Mobility   Ambulation/Gait Ambulation/Gait assistance: Min assist, Contact guard assist Gait Distance (Feet): 120 Feet Assistive device: Quad cane,  None Gait Pattern/deviations: Step-through pattern, Decreased stride length, Shuffle, Trunk flexed, Drifts right/left General Gait Details: improved gait speed with quad cane at CGA initially. Pt trying to walk very quickly. Pt was cued multiple times to slow down. Pt then left quad cane due to frustration and not wanting AD. Pt then required MIn A without an AD. Gait velocity: intermittently slow then fast Gait velocity interpretation: <1.8 ft/sec, indicate of risk for recurrent falls Stairs: Yes Stairs assistance: Min assist Stair Management: One rail Right, Step to pattern,  Forwards, Alternating pattern, Two rails Number of Stairs: 5 General stair comments: increased time and some instability requiring min A for balance     Posture / Balance Dynamic Sitting Balance Sitting balance - Comments: sitting EOB Balance Overall balance assessment: Needs assistance, History of Falls Sitting-balance support: Feet supported Sitting balance-Leahy Scale: Fair Sitting balance - Comments: sitting EOB Standing balance support: No upper extremity supported, During functional activity, Single extremity supported Standing balance-Leahy Scale: Poor Standing balance comment: CGA-min A without AD. worked on changing speed during gait and quick turns. Pt requires Min A for all transitions, stepping over object and quick turns.     Special needs/care consideration Skin Erythema/Redness: coccyx/bilateral    Previous Home Environment (from acute therapy documentation) Living Arrangements: Spouse/significant other  Lives With: Spouse Available Help at Discharge: Family, Available 24 hours/day Type of Home: House Home Layout: Two level, Able to live on main level with bedroom/bathroom Alternate Level Stairs-Rails: Left, Right Alternate Level Stairs-Number of Steps: 5 steps then 10 steps Home Access: Stairs to enter Entrance Stairs-Rails: Right, Left Entrance Stairs-Number of Steps: 4 Bathroom Shower/Tub:  Psychologist, counselling, Door [employer] Toilet: Standard Bathroom Accessibility: Yes How Accessible: Accessible via walker Additional Comments: built in shower seat in upstairs shower; no seated in downstairs shower. Spouse reports fall 1wk ago, pt seems to be denying fall.   Discharge Living Setting Plans for Discharge Living Setting: Patient's home Type of Home at Discharge: House Discharge Home Layout: Able to live on main level with bedroom/bathroom, Two level Alternate Level Stairs-Rails: Left, Right Alternate Level Stairs-Number of Steps: 5 steps then 10 steps Discharge Home Access: Stairs to enter Entrance Stairs-Rails: Right, Left Entrance Stairs-Number of Steps: 4 Discharge Bathroom Shower/Tub: Walk-in shower Discharge Bathroom Toilet: Standard Discharge Bathroom Accessibility: Yes How Accessible: Accessible via walker Does the patient have any problems obtaining your medications?: No   Social/Family/Support Systems Anticipated Caregiver: Tinnie Butts, wife Anticipated Caregiver's Contact Information: [phone] Caregiver Availability: 24/7 Discharge Plan Discussed with Primary Caregiver: Yes Is Caregiver In Agreement with Plan?: Yes Does Caregiver/Family have Issues with Lodging/Transportation while Pt is in Rehab?: No   Goals Patient/Family Goal for Rehab: Supervision: PT/OT, Min-Mod A: ST Expected length of stay: 7-10 days Pt/Family Agrees to Admission and willing to participate: Yes Program Orientation Provided & Reviewed with Pt/Caregiver Including Roles  & Responsibilities: Yes   Decrease burden of Care through IP rehab admission: NA   Possible need for SNF placement upon discharge: Not anticipated   Patient Condition: I have reviewed medical records from [facility], spoken with CM, and patient, spouse, and family member. I met with patient at the bedside and discussed via phone for inpatient rehabilitation assessment.  Patient will benefit from ongoing PT, OT,  and SLP, can actively participate in 3 hours of therapy a day 5 days of the week, and can make measurable gains during the admission.  Patient will also benefit from the coordinated team approach during an Inpatient Acute Rehabilitation admission.  The patient will receive intensive therapy as well as Rehabilitation physician, nursing, social worker, and care management interventions.  Due to safety, skin/wound care, disease management, medication administration, pain management, and patient education the patient requires 24 hour a day rehabilitation nursing.  The patient is currently min to mod assist with mobility and basic ADLs.  Discharge setting and therapy post discharge at home with home health is anticipated.  Patient has agreed to participate in the Acute Inpatient Rehabilitation Program and will admit today.  Preadmission Screen Completed By:  Tinnie SHAUNNA Yvone Delayne, 12/24/2023 1:33 PM  Updated preadmission assessment by Lovett Ropes, RN on 12/24/23 at 1102 am ______________________________________________________________________   Discussed status with Dr. Lovorn on 12/24/23 at 0930 and received approval for admission today.   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 Yes Co-Morbidities requiring supervision/potential complications: worsening receptive/expressive aphasia, neglect,  perseveration, L hemiparesis; Dysphagia; fical seizures-  Oliodendroglioma s/p radiation;  Due to bladder management, bowel management, safety, skin/wound care, disease management, medication administration, and patient education, does the patient require 24 hr/day rehab nursing? Yes Does the patient require coordinated care of a  physician, rehab nurse, PT, OT, and SLP to address physical and functional deficits in the context of the above medical diagnosis(es)? Yes Addressing deficits in the following areas: balance, endurance, locomotion, strength, transferring, bowel/bladder control, bathing, dressing, feeding, grooming, toileting, cognition, speech, language, swallowing, and psychosocial support Can the patient actively participate in an intensive therapy program of at least 3 hrs of therapy 5 days a week? Yes The potential for patient to make measurable gains while on inpatient rehab is good Anticipated functional outcomes upon discharge from inpatient rehab: supervision PT, supervision OT, min assist and mod assist SLP Estimated rehab length of stay to reach the above functional goals is: 7-10 days Anticipated discharge destination: Home 10. Overall Rehab/Functional Prognosis: good     MD Signature:            Revision History

## 2023-12-24 NOTE — H&P (Signed)
 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 Dr. Buckley felt this was secondary to evolving radiation necrosis v/s novel small vessel infracts and was changed to Eliquis  and abilify   briefly added for ongoing behavorial issues.  He was readmitted on 12/18/23 with recurrent falls with left sided weakness. Repeat MRI brain done revaling new 3.2 X 1.3 cm focus of right basal ganglia infarct  and recent right temporal restricted diffusion no longer present. EEG was negative for seizures and showed cortical dysfunction arising from right fronto-temporal region likely due to underlying structural abnormality. Neurology recommended increasing Lamictal  dose for possible partial seizures.  He continued to have waxing and waning of baseline aphasia w/non verbal state at times and left sided weakness as well as left gaze preference with left hemianopia and left facial droop on 07/01 per exam by Dr. Michaela. 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 MRI brain repeated revealing progressive restricted diffusion mid and distal R-ACA territory infarct, ongoing multifocal right MCA deep white and gray matter unchanged from 06/27 and underlying chronic ischemic disease with encephalomalacia.MBS done revealing mild pharyngeal and primary esophageal dysphagia with moderate retention in upper esophagus and all aspiration events were audible and minimal-->D3,  thins and no straws as well as GI consult to see if he's candidate for esophageal intervention.  Carotid dopplers without significant ICA stenosis.   Therapy consulted and has been working with patient on safety awareness (does not want to use AD), frustration tolerance, requires min to CGA due to shuffling gait with tendency to rush, expressive and receptive deficits requiring phenomic cues with improvement in spontaneous responses and impulsivity with gait, developing flexor tone LUE and requires min assist with OT. MBS ordered as wife reporting difficulty with swallowing.       Pt reports with wife's assistance that LBM was today- denies constipation. Is voiding with no difficulties.  Per wife, hold LUE with L elbow flexed at all times.  Per his wife, his aphasia is a lot worse and decreased ability to focus.    Review of Systems  Constitutional:  Negative for chills and fever.  HENT:  Negative for hearing loss and tinnitus.   Eyes:        Left vision deficits since Jan 2025 but worse  Respiratory:  Negative for cough and shortness of breath.   Cardiovascular:  Negative for chest pain and palpitations.  Gastrointestinal:  Negative for constipation, heartburn and nausea.  Genitourinary:  Positive for urgency.  Musculoskeletal:  Negative for back pain, joint pain and myalgias.  Neurological:  Positive for sensory change, speech change, seizures and weakness.  Psychiatric/Behavioral:  The patient does not have insomnia.   All other systems reviewed and are negative.    Past Medical History:  Diagnosis Date   Allergy    Complication of anesthesia    pt reports he is starting to have more difficulty arousing after surgery   Diabetes mellitus (  HCC)    GERD (gastroesophageal reflux disease)    Headache    History of kidney stones    Hyperlipidemia    Renal disorder    kidney stones    Past Surgical History:  Procedure Laterality Date   APPLICATION OF CRANIAL NAVIGATION Right  01/17/2021   Procedure: APPLICATION OF CRANIAL NAVIGATION;  Surgeon: Debby Dorn MATSU, MD;  Location: [facility] OR;  Service: Neurosurgery;  Laterality: Right;   COLONOSCOPY  09/03/1994   COLONOSCOPY WITH PROPOFOL  N/A 01/07/2023   Procedure: COLONOSCOPY WITH PROPOFOL ;  Surgeon: Jinny Carmine, MD;  Location: [facility] ENDOSCOPY;  Service: Endoscopy;  Laterality: N/A;  NOT TOO EARLY   CYSTOSCOPY WITH STENT PLACEMENT Right 01/25/2016   Procedure: CYSTOSCOPY WITH STENT PLACEMENT;  Surgeon: Redell Lynwood Napoleon, MD;  Location: [facility] ORS;  Service: Urology;  Laterality: Right;   FRAMELESS  BIOPSY WITH BRAINLAB Right 01/17/2021   Procedure: RIGHT STEREOTACTIC BIOPSY OF INSULAR LESION;  Surgeon: Debby Dorn MATSU, MD;  Location: [facility] OR;  Service: Neurosurgery;  Laterality: Right;   KNEE ARTHROSCOPY W/ MENISCAL REPAIR Right 06/23/1988   POLYPECTOMY  01/07/2023   Procedure: POLYPECTOMY;  Surgeon: Jinny Carmine, MD;  Location: [facility] ENDOSCOPY;  Service: Endoscopy;;   removal of birthmark  06/08/2013   SKIN CANCER EXCISION  06/23/2012   TYMPANOSTOMY TUBE PLACEMENT  08/06/1978   URETEROSCOPY WITH HOLMIUM LASER LITHOTRIPSY Right 01/25/2016   Procedure: URETEROSCOPY WITH HOLMIUM LASER LITHOTRIPSY;  Surgeon: Redell Lynwood Napoleon, MD;  Location: [facility] ORS;  Service: Urology;  Laterality: Right;   URETHRAL STRICTURE DILATATION     visual inspection of vocal cord     1973   WISDOM TOOTH EXTRACTION      Family History  Problem Relation Age of Onset   Hyperlipidemia Father    Hypertension Father    Diabetes Father    Benign prostatic hyperplasia Father    Stroke Maternal Grandmother    Diabetes Maternal Grandmother    Stroke Paternal Grandmother    Hypertension Paternal Grandmother    Kidney disease Neg Hx    Prostate cancer Neg Hx     Social History:  reports that he has never smoked. He has never used smokeless tobacco. He reports that he does not currently use alcohol. He reports that he does not use  drugs.   Allergies  Allergen Reactions   Contrast Media [Iodinated Contrast Media] Swelling    Medications Prior to Admission  Medication Sig Dispense Refill   amLODipine  (NORVASC ) 5 MG tablet TAKE 1 TABLET (5 MG TOTAL) BY MOUTH DAILY. 30 tablet 2   aspirin  EC 81 MG tablet Take 81 mg by mouth daily. Swallow whole.     lamoTRIgine  (LAMICTAL ) 150 MG tablet Take 150 mg by mouth 2 (two) times daily.     propranolol  (INDERAL ) 20 MG tablet TAKE 1 TABLET BY MOUTH TWICE A DAY 60 tablet 1   apixaban  (ELIQUIS ) 5 MG TABS tablet Take 1 tablet (5 mg total) by mouth 2 (two) times daily. (Patient not taking: Reported on 12/18/2023) 60 tablet 3   ARIPiprazole  (ABILIFY ) 5 MG tablet Take 1 tablet (5 mg total) by mouth daily. (Patient not taking: Reported on 12/18/2023) 30 tablet 3     Home: Home Living Family/patient expects to be discharged to:: Private residence Living Arrangements: Spouse/significant other Available Help at Discharge: Family, Available 24 hours/day Type of Home: House Home Access: Stairs to enter [employer] of Steps: 4 Entrance Stairs-Rails: Right, Left Home Layout: Two level, Able to live on main  level with bedroom/bathroom Alternate Level Stairs-Number of Steps: 5 steps then 10 steps Alternate Level Stairs-Rails: Left, Right Bathroom Shower/Tub: Walk-in shower, Door [employer] Toilet: Standard Bathroom Accessibility: Yes Home Equipment: Shower seat - built in, Hand held shower head Additional Comments: built in shower seat in upstairs shower; no seated in downstairs shower. Spouse reports fall 1wk ago, pt seems to be denying fall.  Lives With: Spouse   Functional History: Prior Function Prior Level of Function : Needs assist, History of Falls (last six months) Mobility Comments: IND with ambulation; no falls. was going upstairs and doing yardwork ADLs Comments: IND with ADLs, wife does the driving, manages finances and med management. Steady decline  Functional  Status:  Mobility: Bed Mobility Overal bed mobility: Needs Assistance Bed Mobility: Supine to Sit, Sit to Supine Supine to sit: Contact guard, Used rails Sit to supine: Min assist, Used rails General bed mobility comments: increased time and min A for trunk elevation. Cues to bring LLE off the EOB due to inattention Transfers Overall transfer level: Needs assistance Equipment used: 1 person hand held assist Transfers: Sit to/from Stand Sit to Stand: Contact guard assist General transfer comment: min A from EOB and CGA from low toilet seat with cues for hand placement Ambulation/Gait Ambulation/Gait assistance: Min assist, Contact guard assist Gait Distance (Feet): 120 Feet Assistive device: Quad cane, None Gait Pattern/deviations: Step-through pattern, Decreased stride length, Shuffle, Trunk flexed, Drifts right/left General Gait Details: improved gait speed with quad cane at CGA initially. Pt trying to walk very quickly. Pt was cued multiple times to slow down. Pt then left quad cane due to frustration and not wanting AD. Pt then required MIn A without an AD. Gait velocity: intermittently slow then fast Gait velocity interpretation: <1.8 ft/sec, indicate of risk for recurrent falls Stairs: Yes Stairs assistance: Min assist Stair Management: One rail Right, Step to pattern, Forwards, Alternating pattern, Two rails Number of Stairs: 5 General stair comments: increased time and some instability requiring min A for balance    ADL: ADL Overall ADL's : Needs assistance/impaired Eating/Feeding: Set up, Sitting Grooming: Sitting, Wash/dry hands, Contact guard assist Upper Body Bathing: Sitting, Set up Lower Body Bathing: Sitting/lateral leans, Set up Upper Body Dressing : Sitting, Moderate assistance Lower Body Dressing: Sitting/lateral leans, Moderate assistance Toilet Transfer: Minimal assistance, Ambulation Toilet Transfer Details (indicate cue type and reason): using toilet like  urinal Toileting- Clothing Manipulation and Hygiene: Sit to/from stand, Minimal assistance Functional mobility during ADLs: Minimal assistance (HHA) General ADL Comments: Focused session on addressing tone with mulitiple manual techniques, explained to pt what tone is and how it is restricing him. also worked no the scapula.  Cognition: Cognition Overall Cognitive Status: Impaired/Different from baseline Arousal/Alertness: Awake/alert Orientation Level: Oriented to person, Oriented to place, Disoriented to time, Disoriented to situation Cognition Arousal: Alert Behavior During Therapy: Flat affect Overall Cognitive Status: Impaired/Different from baseline   Blood pressure 139/83, pulse (!) 58, temperature 98.3 F (36.8 C), resp. rate 18, height 5' 8 (1.727 m), weight 63.5 kg, SpO2 96%. Physical Exam Vitals and nursing note reviewed. Exam conducted with a chaperone present.  Constitutional:      Comments: Awake, aphasic, sitting up in bed; has lost weight since I last saw him; wife at bedside, NAD Slightly low weight at 19.69 BMI  HENT:     Head: Normocephalic and atraumatic.     Comments: L facial droop severe L tongue deviation Facial sensation intact to pt, however has L sided sensory deficit, so  not sure?    Right Ear: External ear normal.     Left Ear: External ear normal.     Nose: Nose normal. No congestion.     Mouth/Throat:     Mouth: Mucous membranes are dry.     Pharynx: Oropharynx is clear. No oropharyngeal exudate.     Comments: Tongue with mild thrush Eyes:     General:        Right eye: No discharge.        Left eye: No discharge.     Comments: L visual field deficit Unable to participate in EOM testing- no nystagmus grossly   Cardiovascular:     Rate and Rhythm: Normal rate and regular rhythm.     Heart sounds: Normal heart sounds. No murmur heard.    No gallop.  Pulmonary:     Effort: Pulmonary effort is normal. No respiratory distress.     Breath  sounds: Normal breath sounds. No wheezing, rhonchi or rales.  Abdominal:     General: Bowel sounds are normal. There is no distension.     Palpations: Abdomen is soft.     Tenderness: There is no abdominal tenderness.  Musculoskeletal:        General: Normal range of motion.     Cervical back: Neck supple. No tenderness.     Comments: RUE-  5-/5 throughout- grip is 5/5 - but couldn't get pt to let go in spite of hurting me LUE- biceps 4/5; triceps 4+/5; WE 4-/5; grip 4/5; FA 2+/5 RLE- 5-/5 throughout LLE- 4/5 throughout Holds LUE in posturing/elbow flexed  Skin:    General: Skin is warm and dry.     Comments: R AC fossa IV- looks OK, but needs to come out in next 24-48 hours  Neurological:     Mental Status: He is alert.     Comments: Left facial weakness with mild to moderate dysarthria. Left sided weakness with flexor tone and sensory deficits affecting face, LUE/LLE. Expressive/receptive deficits with frustration when corrected. Extrmely frustrated, with significant perseveration, L inattention/neglect Decreased to light touch in L side throughout Severe expressive aphasia- a lot of repetition of and/the/etc- however also has mild to moderate receptive aphasia as well Increased tone mainly in LUE- elbow held in flexion at rest -MAS of 2 at elbow and shoulder- and MAS 1+ at wrist/fingers  Psychiatric:     Comments: Very irritable- lashed out at wife when she attempted to convey information- and got very angry trying to speak. Agitated during interaction     Results for orders placed or performed during the hospital encounter of 12/18/23 (from the past 48 hours)  Comprehensive metabolic panel with GFR     Status: Abnormal   Collection Time: 12/23/23  4:48 AM  Result Value Ref Range   Sodium 137 135 - 145 mmol/L   Potassium 4.1 3.5 - 5.1 mmol/L   Chloride 102 98 - 111 mmol/L   CO2 26 22 - 32 mmol/L   Glucose, Bld 117 (H) 70 - 99 mg/dL    Comment: Glucose reference range applies  only to samples taken after fasting for at least 8 hours.   BUN 11 6 - 20 mg/dL   Creatinine, Ser 9.12 0.61 - 1.24 mg/dL   Calcium  9.4 8.9 - 10.3 mg/dL   Total Protein 6.5 6.5 - 8.1 g/dL   Albumin 3.6 3.5 - 5.0 g/dL   AST 20 15 - 41 U/L   ALT 32 0 - 44 U/L  Alkaline Phosphatase 56 38 - 126 U/L   Total Bilirubin 0.6 0.0 - 1.2 mg/dL   GFR, Estimated >39 >39 mL/min    Comment: (NOTE) Calculated using the CKD-EPI Creatinine Equation (2021)    Anion gap 9 5 - 15    Comment: Performed at [facility], [address], [place], [place] [address]   MR BRAIN WO CONTRAST Result Date: 12/23/2023 CLINICAL DATA:  53 year old male with punctate right temporal lobe infarct last month. Neurologic deficit, right basal ganglia infarct on 12/18/2023. EXAM: MRI HEAD WITHOUT CONTRAST TECHNIQUE: Multiplanar, multiecho pulse sequences of the brain and surrounding structures were obtained without intravenous contrast. COMPARISON:  Brain MRI 12/18/2023 and earlier. FINDINGS: Brain: Progressive restricted diffusion now beginning anterior to the right caudate and continuing through the nearby right ACA territory, confluent involvement along a 4-5 cm segment of the right cingulate (series 3, image 40). Unresolved diffusion restriction previously seen along the right frontal horn, and also in the posterior right corona radiata tracking toward the right lentiform, and also lateral to the atrium of the right lateral ventricle. Also punctate new restricted diffusion in the left lentiform series 3, image 30. T2 and FLAIR hyperintense cytotoxic edema in the affected areas. Petechial hemorrhage on T2* imaging not significantly changed from previous SWI Heidelberg classification 1b: HI2, confluent petechiae, no mass effect. Pre-existing cystic encephalomalacia and hemosiderin along the anterior right MCA division also. Stable T2 and FLAIR hyperintense probable gliosis of the right insula there. Stable encephalomalacia with T2  shine through along the anterior the left genu of the corpus callosum. Chronic brainstem Wallerian degeneration, greater on the right. No midline shift, mass effect, evidence of mass lesion, acute ventriculomegaly, extra-axial collection. Cervicomedullary junction and pituitary are within normal limits. Vascular: Major intracranial vascular flow voids are stable. Skull and upper cervical spine: Negative. Visualized bone marrow signal is within normal limits. Sinuses/Orbits: Stable paranasal sinus mucosal thickening and opacification. Negative orbits. Other: Mastoids well aerated. IMPRESSION: 1. New acute and confluent mid and distal Right ACA territory infarction. Punctate new left basal ganglia acute lacunar infarct. And ongoing multifocal right MCA deep white and deep gray matter ischemia otherwise not significantly changed from 12/18/2023. 2. Associated fatigue no hemorrhage. No malignant hemorrhagic transformation. No intracranial mass effect or midline shift. 3. Underlying chronic ischemic disease and encephalomalacia. Electronically Signed   By: VEAR Hurst M.D.   On: 12/23/2023 13:02   Overnight EEG with video Result Date: 12/23/2023 [name], MD     12/23/2023 12:49 PM Patient Name: JOEZIAH VOIT MRN: [id number] Epilepsy Attending: [name] Shelton Referring Physician/Provider: Judithe Rocky BROCKS, NP Duration: 12/22/2023 2256 to 12/23/2023 [id number] Patient history: 53 year old male with altered mental status.  Today had left gaze preference, left hemianopia and facial droop with waxing and waning aphasia and weakness.  EEG to evaluate for seizure. Level of alertness: Awake, asleep AEDs during EEG study: LTG Technical aspects: This EEG study was done with scalp electrodes positioned according to the 10-20 International system of electrode placement. Electrical activity was reviewed with band pass filter of 1-70Hz , sensitivity of 7 uV/mm, display speed of 35mm/sec with a 60Hz  notched filter applied as appropriate.  EEG data were recorded continuously and digitally stored.  Video monitoring was available and reviewed as appropriate. Description: The posterior dominant rhythm consists of 8-9 Hz activity of moderate voltage (25-35 uV) seen predominantly in posterior head regions, symmetric and reactive to eye opening and eye closing. Sleep was characterized by vertex waves, sleep  spindles (12 to 14 Hz), maximal frontocentral region. EEG showed continuous 3 to 6 Hz theta-delta slowing in right frontotemporal region. Hyperventilation and photic stimulation were not performed.   ABNORMALITY - Continuous slow, right frontotemporal region IMPRESSION: This study is suggestive of cortical dysfunction arising from right frontotemporal region likely secondary to underlying strokes, postictal state. No seizures or epileptiform discharges were seen throughout the recording. Arlin MALVA Krebs   MR ANGIO HEAD WO CONTRAST Result Date: 12/23/2023 CLINICAL DATA:  Neuro deficit, acute, stroke suspected EXAM: MRA HEAD WITHOUT CONTRAST TECHNIQUE: Angiographic images of the Circle of Willis were acquired using MRA technique without intravenous contrast. COMPARISON:  CT head from earlier today. FINDINGS: Anterior circulation: Hypoplastic left A1 ACA. Otherwise, bilateral intracranial ICAs, MCAs, and ACAs are patent. 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 Posterior circulation: Bilateral intradural vertebral arteries, basilar artery and bilateral posterior cerebral arteries are patent without proximal hemodynamically significant stenosis. Bilateral fetal type PCAs, anatomic variant. IMPRESSION: 1. No emergent large vessel occlusion. 2. 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 Electronically Signed   By: Gilmore GORMAN Molt M.D.   On: 12/23/2023 00:48   CT HEAD WO CONTRAST ( ) Result Date: 12/22/2023 CLINICAL DATA:  Stroke, follow up EXAM: CT HEAD WITHOUT CONTRAST TECHNIQUE: Contiguous axial images were obtained from the base of the skull through the  vertex without intravenous contrast. RADIATION DOSE REDUCTION: This exam was performed according to the departmental dose-optimization program which includes automated exposure control, adjustment of the mA and/or kV according to patient size and/or use of iterative reconstruction technique. COMPARISON:  CT head December 18, 2023. FINDINGS: Brain: Stable areas of dystrophic calcification encephalomalacia in the regions of prior treated malignancy including right frontal lobe and right periatrial region. Known infarcts were better characterized on recent MRI, but do not appear substantially changed when comparing across modalities. No progressive mass effect or acute hemorrhage. Vascular: No hyperdense vessel identified. Skull: No acute fracture. Sinuses/Orbits: Ethmoid air cell and frontal sinus mucosal thickening. No acute orbital findings. Other: No mastoid effusions. IMPRESSION: 1. Known infarcts were better characterized on recent MRI, but do not appear substantially changed when comparing across modalities. No progressive mass effect or acute hemorrhage. 2. Similar appearance of treated tumor with dystrophic calcification. Electronically Signed   By: Gilmore GORMAN Molt M.D.   On: 12/22/2023 20:13      Blood pressure 139/83, pulse (!) 58, temperature 98.3 F (36.8 C), resp. rate 18, height 5' 8 (1.727 m), weight 63.5 kg, SpO2 96%.  Medical Problem List and Plan: 1. Functional deficits secondary to multiple infarcts, the worst R ACA infarct- with L hemiparesis  -patient may  shower  -ELOS/Goals: 7-10 days supervision for PT and OT_ min-mod A for SLP  Admit to CIR 2.  Antithrombotics: -DVT/anticoagulation:  Pharmaceutical: Eliquis   -antiplatelet therapy: N/A 3. Pain Management: Routine pressure relief measures.  4. Mood/Behavior/Sleep: LCSW to follow for evaluation and support.   -antipsychotic agents: N/A 5. Neuropsych/cognition: This patient is not fully capable of making decisions on his own  behalf. 6. Skin/Wound Care: Routine pressure relief measure.  7. Fluids/Electrolytes/Nutrition: monitor I/O. Check CMET in am 8. T2DM: Hgb A1c-5.4 and diet controlled.  9.  HTN: Monitor BP TID--avoid hypoperfusion. Continue Amlodipine  5 mg/day and inderal  20 mg bid.  10. Dysphagia: NO straws to decrease aspiration events.  --GI consult recommended as retention of solids contribute to aspiration. Per Dr. Lizabeth note this was discussed with GI and who felt patient to be too high risk for GI  procedures and recommended follow up after d/c.  11. Seizure d/o; On Lamictal  150 mg am and 200 mg pm-->educate on compliance. 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  13. Dyslipidemia: LDL-129 and HDL 27. On Lipitor 40 mg/day. .  14. Frequency/Nocturia: Gets up at nights and pt/wife agreed to toileting at 11 pm and 3 am to prevent attempts out of bed.  15. Spasticity - concerned about starting Any spasticity meds due to risk of sedation/confusion - suggest to be assessed for Botulinum toxin for LUE.  16. L inattention/neglect and perseveration- will d/w primary CIR physician- Dr Emeline.     Sharlet GORMAN Schmitz, PA-C 12/24/2023  I have personally performed a face to face diagnostic evaluation of this patient and formulated the key components of the plan.  Additionally, I have personally reviewed laboratory data, imaging studies, as well as relevant notes and concur with the physician assistant's documentation above.   The patient's status has not changed from the original H&P.  Any changes in documentation from the acute care chart have been noted above.

## 2023-12-24 NOTE — Plan of Care (Signed)

## 2023-12-24 NOTE — Procedures (Signed)
 Modified Barium Swallow Study  Patient Details  Name: Alan Wise MRN: [id number] Date of Birth: August 17, 1970  Today's Date: 12/24/2023  Modified Barium Swallow completed.  Full report located under Chart Review in the Imaging Section.  History of Present Illness 53 y.o. male presents to [facility] 12/18/23 after falling two days prior with residual L sided weakness and somnolence. Brain MRI showed new patchy restricted diffusion in R basal ganglia, new punctuate focus of restricted diffusion in R caudate head. Recent admit 6/17-6/19 with anterior R temporal lobe punctate restricted diffusion. PMHx: T2DM, GERD, CVA w/ L sided weakness, oligodendroglioma   Clinical Impression  Pt presents with a mild pharyngeal dysphagia and a primary esophageal dysphagia that is likely contributing to pharyngeal deficits. Recommend follow up with GI to determine if pt is a candidate for any intervention to aid esophageal clearance of solids and liquids.   Oral phase judged to be WNL.   Pharyngeal deficits characterized by reduced base of tongue retraction, occasional mistiming of epiglottic inversion (vs incomplete inversion) and laryngeal vestibule closure, which contributed to aspiration events. Reduced distention of the UES was the greatest pharyngeal concern.   Concerns for esophageal dysphagia included moderate retention of pudding and solid in upper esophagus and backflow of thin liquids (by straw) through the UES. Retention of solids likely contributed to aspiration event of thin liquid wash that followed solid trials. During esophageal sweep, retention of liquids observed in the lower esophagus with some retrograde flow.  This residue did eventually pass to the stomach.   Findings:  -All aspiration events were audible and minimal.  -There was audible aspiration of nectar-thick liquids x1 by cup during the swallow, suspect due to mistiming of epiglottic inversion and laryngeal vestibule closure.  -There  was audible aspiration of thin liquid wash that followed solid trial.  -There was pharyngeal residue primarily in the UES post swallow. Residue amount increased with increased viscosity.  -special note: pt does have calcification of portions of the airway and suspected in portion of artery around the anterior upper esophagus. This is mentioned because calcified areas move with swallow initiation and it briefly looks like aspiration.   Detailed diet recommendations and aspiration precautions outlined below. Despite aspiration of thin liquids and ongoing risk of aspiration, recommend continue this diet given pt has not exhibited concerns for pneumonia and pt is at a risk of aspirated nectar-thick liquids. Research shows the aspiration of thickened liquids can be more detrimental to the lungs; therefore, we will continue to monitor his tolerance of thin liquids closely.   SLP will continue to follow for language and swallowing intervention.    Factors that may increase risk of adverse event in presence of aspiration Alan Wise & Alan Wise 2021):  none  Swallow Evaluation Recommendations Recommendations: PO diet PO Diet Recommendation: Thin liquids (Level 0);Dysphagia 3 (Mechanical soft) Liquid Administration via: Cup;No straw Medication Administration: Whole meds with puree (consider crushing if he is having pill dysphagia) Supervision: Patient able to self-feed;Intermittent supervision/cueing for swallowing strategies Swallowing strategies  : Slow rate;Small bites/sips;Follow solids with liquids Postural changes: Position pt fully upright for meals;Stay upright 30-60 min after meals Oral care recommendations: Oral care BID (2x/day) Recommended consults: Consider GI consultation      Alan Wise 12/24/2023,12:00 PM

## 2023-12-24 NOTE — H&P (Signed)
 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 Dr. Buckley felt this was secondary to evolving radiation necrosis v/s novel small vessel infracts and was changed to Eliquis  and abilify   briefly added for ongoing behavorial issues.  He was readmitted on 12/18/23 with recurrent falls with left sided weakness. Repeat MRI brain done revaling new 3.2 X 1.3 cm focus of right basal ganglia infarct  and recent right temporal restricted diffusion no longer present. EEG was negative for seizures and showed cortical dysfunction arising from right fronto-temporal region likely due to underlying structural abnormality. Neurology recommended increasing Lamictal  dose for possible partial seizures.   He continued to have waxing and waning of baseline aphasia w/non verbal state at times and left sided weakness as well as left gaze preference with left hemianopia and left facial droop on 07/01 per exam by Dr. Michaela. 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 MRI brain repeated revealing progressive restricted diffusion mid and distal R-ACA territory infarct, ongoing multifocal right MCA deep white and gray matter unchanged from 06/27 and underlying chronic ischemic disease with encephalomalacia.MBS done revealing mild pharyngeal and primary esophageal dysphagia with moderate retention in upper esophagus and all aspiration events were audible and  minimal-->D3, thins and no straws as well as GI consult to see if he's candidate for esophageal intervention.  Carotid dopplers without significant ICA stenosis.    Therapy consulted and has been working with patient on safety awareness (does not want to use AD), frustration tolerance, requires min to CGA due to shuffling gait with tendency to rush, expressive and receptive deficits requiring phenomic cues with improvement in spontaneous responses and impulsivity with gait, developing flexor tone LUE and requires min assist with OT. MBS ordered as wife reporting difficulty with swallowing.        Pt reports with wife's assistance that LBM was today- denies constipation. Is voiding with no difficulties.   Per wife, hold LUE with L elbow flexed at all times.  Per his wife, his aphasia is a lot worse and decreased ability to focus.      Review of Systems  Constitutional:  Negative for chills and fever.  HENT:  Negative for hearing loss and tinnitus.   Eyes:        Left vision deficits since Jan 2025 but worse  Respiratory:  Negative for cough and shortness of breath.   Cardiovascular:  Negative for chest pain and palpitations.  Gastrointestinal:  Negative for constipation, heartburn and nausea.  Genitourinary:  Positive for urgency.  Musculoskeletal:  Negative for back pain, joint pain and myalgias.  Neurological:  Positive for sensory change, speech change, seizures and weakness.  Psychiatric/Behavioral:  The patient does not have insomnia.   All other systems reviewed and are negative.          Past Medical History:  Diagnosis Date   Allergy     Complication  of anesthesia      pt reports he is starting to have more difficulty arousing after surgery   Diabetes mellitus (HCC)     GERD (gastroesophageal reflux disease)     Headache     History of kidney stones     Hyperlipidemia     Renal disorder      kidney stones               Past Surgical History:  Procedure Laterality  Date   APPLICATION OF CRANIAL NAVIGATION Right 01/17/2021    Procedure: APPLICATION OF CRANIAL NAVIGATION;  Surgeon: Debby Dorn MATSU, MD;  Location: [facility] OR;  Service: Neurosurgery;  Laterality: Right;   COLONOSCOPY   09/03/1994   COLONOSCOPY WITH PROPOFOL  N/A 01/07/2023    Procedure: COLONOSCOPY WITH PROPOFOL ;  Surgeon: Jinny Carmine, MD;  Location: [facility] ENDOSCOPY;  Service: Endoscopy;  Laterality: N/A;  NOT TOO EARLY   CYSTOSCOPY WITH STENT PLACEMENT Right 01/25/2016    Procedure: CYSTOSCOPY WITH STENT PLACEMENT;  Surgeon: Redell Lynwood Napoleon, MD;  Location: [facility] ORS;  Service: Urology;  Laterality: Right;   FRAMELESS  BIOPSY WITH BRAINLAB Right 01/17/2021    Procedure: RIGHT STEREOTACTIC BIOPSY OF INSULAR LESION;  Surgeon: Debby Dorn MATSU, MD;  Location: [facility] OR;  Service: Neurosurgery;  Laterality: Right;   KNEE ARTHROSCOPY W/ MENISCAL REPAIR Right 06/23/1988   POLYPECTOMY   01/07/2023    Procedure: POLYPECTOMY;  Surgeon: Jinny Carmine, MD;  Location: [facility] ENDOSCOPY;  Service: Endoscopy;;   removal of birthmark   06/08/2013   SKIN CANCER EXCISION   06/23/2012   TYMPANOSTOMY TUBE PLACEMENT   08/06/1978   URETEROSCOPY WITH HOLMIUM LASER LITHOTRIPSY Right 01/25/2016    Procedure: URETEROSCOPY WITH HOLMIUM LASER LITHOTRIPSY;  Surgeon: Redell Lynwood Napoleon, MD;  Location: [facility] ORS;  Service: Urology;  Laterality: Right;   URETHRAL STRICTURE DILATATION       visual inspection of vocal cord        1973   WISDOM TOOTH EXTRACTION                   Family History  Problem Relation Age of Onset   Hyperlipidemia Father     Hypertension Father     Diabetes Father     Benign prostatic hyperplasia Father     Stroke Maternal Grandmother     Diabetes Maternal Grandmother     Stroke Paternal Grandmother     Hypertension Paternal Grandmother     Kidney disease Neg Hx     Prostate cancer Neg Hx            Social History:  reports that he has never smoked. He has never used smokeless tobacco. He  reports that he does not currently use alcohol. He reports that he does not use drugs.     Allergies      Allergies  Allergen Reactions   Contrast Media [Iodinated Contrast Media] Swelling              Medications Prior to Admission  Medication Sig Dispense Refill   amLODipine  (NORVASC ) 5 MG tablet TAKE 1 TABLET (5 MG TOTAL) BY MOUTH DAILY. 30 tablet 2   aspirin  EC 81 MG tablet Take 81 mg by mouth daily. Swallow whole.       lamoTRIgine  (LAMICTAL ) 150 MG tablet Take 150 mg by mouth 2 (two) times daily.       propranolol  (INDERAL ) 20 MG tablet TAKE 1 TABLET BY MOUTH TWICE A DAY 60 tablet 1  apixaban  (ELIQUIS ) 5 MG TABS tablet Take 1 tablet (5 mg total) by mouth 2 (two) times daily. (Patient not taking: Reported on 12/18/2023) 60 tablet 3   ARIPiprazole  (ABILIFY ) 5 MG tablet Take 1 tablet (5 mg total) by mouth daily. (Patient not taking: Reported on 12/18/2023) 30 tablet 3            Home: Home Living Family/patient expects to be discharged to:: Private residence Living Arrangements: Spouse/significant other Available Help at Discharge: Family, Available 24 hours/day Type of Home: House Home Access: Stairs to enter [employer] of Steps: 4 Entrance Stairs-Rails: Right, Left Home Layout: Two level, Able to live on main level with bedroom/bathroom Alternate Level Stairs-Number of Steps: 5 steps then 10 steps Alternate Level Stairs-Rails: Left, Right Bathroom Shower/Tub: Walk-in shower, Door [employer] Toilet: Standard Bathroom Accessibility: Yes Home Equipment: Shower seat - built in, Hand held shower head Additional Comments: built in shower seat in upstairs shower; no seated in downstairs shower. Spouse reports fall 1wk ago, pt seems to be denying fall.  Lives With: Spouse   Functional History: Prior Function Prior Level of Function : Needs assist, History of Falls (last six months) Mobility Comments: IND with ambulation; no falls. was going upstairs and doing  yardwork ADLs Comments: IND with ADLs, wife does the driving, manages finances and med management. Steady decline   Functional Status:  Mobility: Bed Mobility Overal bed mobility: Needs Assistance Bed Mobility: Supine to Sit, Sit to Supine Supine to sit: Contact guard, Used rails Sit to supine: Min assist, Used rails General bed mobility comments: increased time and min A for trunk elevation. Cues to bring LLE off the EOB due to inattention Transfers Overall transfer level: Needs assistance Equipment used: 1 person hand held assist Transfers: Sit to/from Stand Sit to Stand: Contact guard assist General transfer comment: min A from EOB and CGA from low toilet seat with cues for hand placement Ambulation/Gait Ambulation/Gait assistance: Min assist, Contact guard assist Gait Distance (Feet): 120 Feet Assistive device: Quad cane, None Gait Pattern/deviations: Step-through pattern, Decreased stride length, Shuffle, Trunk flexed, Drifts right/left General Gait Details: improved gait speed with quad cane at CGA initially. Pt trying to walk very quickly. Pt was cued multiple times to slow down. Pt then left quad cane due to frustration and not wanting AD. Pt then required MIn A without an AD. Gait velocity: intermittently slow then fast Gait velocity interpretation: <1.8 ft/sec, indicate of risk for recurrent falls Stairs: Yes Stairs assistance: Min assist Stair Management: One rail Right, Step to pattern, Forwards, Alternating pattern, Two rails Number of Stairs: 5 General stair comments: increased time and some instability requiring min A for balance   ADL: ADL Overall ADL's : Needs assistance/impaired Eating/Feeding: Set up, Sitting Grooming: Sitting, Wash/dry hands, Contact guard assist Upper Body Bathing: Sitting, Set up Lower Body Bathing: Sitting/lateral leans, Set up Upper Body Dressing : Sitting, Moderate assistance Lower Body Dressing: Sitting/lateral leans, Moderate  assistance Toilet Transfer: Minimal assistance, Ambulation Toilet Transfer Details (indicate cue type and reason): using toilet like urinal Toileting- Clothing Manipulation and Hygiene: Sit to/from stand, Minimal assistance Functional mobility during ADLs: Minimal assistance (HHA) General ADL Comments: Focused session on addressing tone with mulitiple manual techniques, explained to pt what tone is and how it is restricing him. also worked no the scapula.   Cognition: Cognition Overall Cognitive Status: Impaired/Different from baseline Arousal/Alertness: Awake/alert Orientation Level: Oriented to person, Oriented to place, Disoriented to time, Disoriented to situation Cognition Arousal: Alert Behavior During  Therapy: Flat affect Overall Cognitive Status: Impaired/Different from baseline     Blood pressure 139/83, pulse (!) 58, temperature 98.3 F (36.8 C), resp. rate 18, height 5' 8 (1.727 m), weight 63.5 kg, SpO2 96%. Physical Exam Vitals and nursing note reviewed. Exam conducted with a chaperone present.  Constitutional:      Comments: Awake, aphasic, sitting up in bed; has lost weight since I last saw him; wife at bedside, NAD Slightly low weight at 19.69 BMI  HENT:     Head: Normocephalic and atraumatic.     Comments: L facial droop severe L tongue deviation Facial sensation intact to pt, however has L sided sensory deficit, so not sure?    Right Ear: External ear normal.     Left Ear: External ear normal.     Nose: Nose normal. No congestion.     Mouth/Throat:     Mouth: Mucous membranes are dry.     Pharynx: Oropharynx is clear. No oropharyngeal exudate.     Comments: Tongue with mild thrush Eyes:     General:        Right eye: No discharge.        Left eye: No discharge.     Comments: L visual field deficit Unable to participate in EOM testing- no nystagmus grossly   Cardiovascular:     Rate and Rhythm: Normal rate and regular rhythm.     Heart sounds: Normal  heart sounds. No murmur heard.    No gallop.  Pulmonary:     Effort: Pulmonary effort is normal. No respiratory distress.     Breath sounds: Normal breath sounds. No wheezing, rhonchi or rales.  Abdominal:     General: Bowel sounds are normal. There is no distension.     Palpations: Abdomen is soft.     Tenderness: There is no abdominal tenderness.  Musculoskeletal:        General: Normal range of motion.     Cervical back: Neck supple. No tenderness.     Comments: RUE-  5-/5 throughout- grip is 5/5 - but couldn't get pt to let go in spite of hurting me LUE- biceps 4/5; triceps 4+/5; WE 4-/5; grip 4/5; FA 2+/5 RLE- 5-/5 throughout LLE- 4/5 throughout Holds LUE in posturing/elbow flexed  Skin:    General: Skin is warm and dry.     Comments: R AC fossa IV- looks OK, but needs to come out in next 24-48 hours  Neurological:     Mental Status: He is alert.     Comments: Left facial weakness with mild to moderate dysarthria. Left sided weakness with flexor tone and sensory deficits affecting face, LUE/LLE. Expressive/receptive deficits with frustration when corrected. Extrmely frustrated, with significant perseveration, L inattention/neglect Decreased to light touch in L side throughout Severe expressive aphasia- a lot of repetition of and/the/etc- however also has mild to moderate receptive aphasia as well Increased tone mainly in LUE- elbow held in flexion at rest -MAS of 2 at elbow and shoulder- and MAS 1+ at wrist/fingers  Psychiatric:     Comments: Very irritable- lashed out at wife when she attempted to convey information- and got very angry trying to speak. Agitated during interaction       Lab Results Last 48 Hours        Results for orders placed or performed during the hospital encounter of 12/18/23 (from the past 48 hours)  Comprehensive metabolic panel with GFR     Status: Abnormal    Collection Time:  12/23/23  4:48 AM  Result Value Ref Range    Sodium 137 135 - 145 mmol/L     Potassium 4.1 3.5 - 5.1 mmol/L    Chloride 102 98 - 111 mmol/L    CO2 26 22 - 32 mmol/L    Glucose, Bld 117 (H) 70 - 99 mg/dL      Comment: Glucose reference range applies only to samples taken after fasting for at least 8 hours.    BUN 11 6 - 20 mg/dL    Creatinine, Ser 9.12 0.61 - 1.24 mg/dL    Calcium  9.4 8.9 - 10.3 mg/dL    Total Protein 6.5 6.5 - 8.1 g/dL    Albumin 3.6 3.5 - 5.0 g/dL    AST 20 15 - 41 U/L    ALT 32 0 - 44 U/L    Alkaline Phosphatase 56 38 - 126 U/L    Total Bilirubin 0.6 0.0 - 1.2 mg/dL    GFR, Estimated >39 >39 mL/min      Comment: (NOTE) Calculated using the CKD-EPI Creatinine Equation (2021)      Anion gap 9 5 - 15      Comment: Performed at [facility], [address] [place], [place] [address]       Imaging Results (Last 48 hours)  MR BRAIN WO CONTRAST Result Date: 12/23/2023 CLINICAL DATA:  53 year old male with punctate right temporal lobe infarct last month. Neurologic deficit, right basal ganglia infarct on 12/18/2023. EXAM: MRI HEAD WITHOUT CONTRAST TECHNIQUE: Multiplanar, multiecho pulse sequences of the brain and surrounding structures were obtained without intravenous contrast. COMPARISON:  Brain MRI 12/18/2023 and earlier. FINDINGS: Brain: Progressive restricted diffusion now beginning anterior to the right caudate and continuing through the nearby right ACA territory, confluent involvement along a 4-5 cm segment of the right cingulate (series 3, image 40). Unresolved diffusion restriction previously seen along the right frontal horn, and also in the posterior right corona radiata tracking toward the right lentiform, and also lateral to the atrium of the right lateral ventricle. Also punctate new restricted diffusion in the left lentiform series 3, image 30. T2 and FLAIR hyperintense cytotoxic edema in the affected areas. Petechial hemorrhage on T2* imaging not significantly changed from previous SWI Heidelberg classification 1b: HI2, confluent  petechiae, no mass effect. Pre-existing cystic encephalomalacia and hemosiderin along the anterior right MCA division also. Stable T2 and FLAIR hyperintense probable gliosis of the right insula there. Stable encephalomalacia with T2 shine through along the anterior the left genu of the corpus callosum. Chronic brainstem Wallerian degeneration, greater on the right. No midline shift, mass effect, evidence of mass lesion, acute ventriculomegaly, extra-axial collection. Cervicomedullary junction and pituitary are within normal limits. Vascular: Major intracranial vascular flow voids are stable. Skull and upper cervical spine: Negative. Visualized bone marrow signal is within normal limits. Sinuses/Orbits: Stable paranasal sinus mucosal thickening and opacification. Negative orbits. Other: Mastoids well aerated. IMPRESSION: 1. New acute and confluent mid and distal Right ACA territory infarction. Punctate new left basal ganglia acute lacunar infarct. And ongoing multifocal right MCA deep white and deep gray matter ischemia otherwise not significantly changed from 12/18/2023. 2. Associated fatigue no hemorrhage. No malignant hemorrhagic transformation. No intracranial mass effect or midline shift. 3. Underlying chronic ischemic disease and encephalomalacia. Electronically Signed   By: VEAR Hurst M.D.   On: 12/23/2023 13:02    Overnight EEG with video Result Date: 12/23/2023 [name], MD     12/23/2023 12:49 PM Patient Name:  SILVIANO NEUSER MRN: [id number] Epilepsy Attending: Arlin MALVA Krebs Referring Physician/Provider: Judithe Rocky BROCKS, NP Duration: 12/22/2023 2256 to 12/23/2023 [id number] Patient history: 53 year old male with altered mental status.  Today had left gaze preference, left hemianopia and facial droop with waxing and waning aphasia and weakness.  EEG to evaluate for seizure. Level of alertness: Awake, asleep AEDs during EEG study: LTG Technical aspects: This EEG study was done with scalp electrodes positioned  according to the 10-20 International system of electrode placement. Electrical activity was reviewed with band pass filter of 1-70Hz , sensitivity of 7 uV/mm, display speed of 76mm/sec with a 60Hz  notched filter applied as appropriate. EEG data were recorded continuously and digitally stored.  Video monitoring was available and reviewed as appropriate. Description: The posterior dominant rhythm consists of 8-9 Hz activity of moderate voltage (25-35 uV) seen predominantly in posterior head regions, symmetric and reactive to eye opening and eye closing. Sleep was characterized by vertex waves, sleep spindles (12 to 14 Hz), maximal frontocentral region. EEG showed continuous 3 to 6 Hz theta-delta slowing in right frontotemporal region. Hyperventilation and photic stimulation were not performed.   ABNORMALITY - Continuous slow, right frontotemporal region IMPRESSION: This study is suggestive of cortical dysfunction arising from right frontotemporal region likely secondary to underlying strokes, postictal state. No seizures or epileptiform discharges were seen throughout the recording. Arlin MALVA Krebs    MR ANGIO HEAD WO CONTRAST Result Date: 12/23/2023 CLINICAL DATA:  Neuro deficit, acute, stroke suspected EXAM: MRA HEAD WITHOUT CONTRAST TECHNIQUE: Angiographic images of the Circle of Willis were acquired using MRA technique without intravenous contrast. COMPARISON:  CT head from earlier today. FINDINGS: Anterior circulation: Hypoplastic left A1 ACA. Otherwise, bilateral intracranial ICAs, MCAs, and ACAs are patent. 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 Posterior circulation: Bilateral intradural vertebral arteries, basilar artery and bilateral posterior cerebral arteries are patent without proximal hemodynamically significant stenosis. Bilateral fetal type PCAs, anatomic variant. IMPRESSION: 1. No emergent large vessel occlusion. 2. 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 Electronically Signed   By: Gilmore GORMAN Molt  M.D.   On: 12/23/2023 00:48    CT HEAD WO CONTRAST ( ) Result Date: 12/22/2023 CLINICAL DATA:  Stroke, follow up EXAM: CT HEAD WITHOUT CONTRAST TECHNIQUE: Contiguous axial images were obtained from the base of the skull through the vertex without intravenous contrast. RADIATION DOSE REDUCTION: This exam was performed according to the departmental dose-optimization program which includes automated exposure control, adjustment of the mA and/or kV according to patient size and/or use of iterative reconstruction technique. COMPARISON:  CT head December 18, 2023. FINDINGS: Brain: Stable areas of dystrophic calcification encephalomalacia in the regions of prior treated malignancy including right frontal lobe and right periatrial region. Known infarcts were better characterized on recent MRI, but do not appear substantially changed when comparing across modalities. No progressive mass effect or acute hemorrhage. Vascular: No hyperdense vessel identified. Skull: No acute fracture. Sinuses/Orbits: Ethmoid air cell and frontal sinus mucosal thickening. No acute orbital findings. Other: No mastoid effusions. IMPRESSION: 1. Known infarcts were better characterized on recent MRI, but do not appear substantially changed when comparing across modalities. No progressive mass effect or acute hemorrhage. 2. Similar appearance of treated tumor with dystrophic calcification. Electronically Signed   By: Gilmore GORMAN Molt M.D.   On: 12/22/2023 20:13           Blood pressure 139/83, pulse (!) 58, temperature 98.3 F (36.8 C), resp. rate 18, height 5' 8 (1.727 m), weight 63.5 kg, SpO2 96%.  Medical Problem List and Plan: 1. Functional deficits secondary to multiple infarcts, the worst R ACA infarct- with L hemiparesis             -patient may  shower             -ELOS/Goals: 7-10 days supervision for PT and OT_ min-mod A for SLP             Admit to CIR 2.  Antithrombotics: -DVT/anticoagulation:  Pharmaceutical: Eliquis               -antiplatelet therapy: N/A 3. Pain Management: Routine pressure relief measures.  4. Mood/Behavior/Sleep: LCSW to follow for evaluation and support.              -antipsychotic agents: N/A 5. Neuropsych/cognition: This patient is not fully capable of making decisions on his own behalf. 6. Skin/Wound Care: Routine pressure relief measure.  7. Fluids/Electrolytes/Nutrition: monitor I/O. Check CMET in am 8. T2DM: Hgb A1c-5.4 and diet controlled.  9.  HTN: Monitor BP TID--avoid hypoperfusion. Continue Amlodipine  5 mg/day and inderal  20 mg bid.  10. Dysphagia: NO straws to decrease aspiration events.  --GI consult recommended as retention of solids contribute to aspiration. Per Dr. Lizabeth note this was discussed with GI and who felt patient to be too high risk for GI procedures and recommended follow up after d/c.  11. Seizure d/o; On Lamictal  150 mg am and 200 mg pm-->educate on compliance. 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  13. Dyslipidemia: LDL-129 and HDL 27. On Lipitor 40 mg/day. .  14. Frequency/Nocturia: Gets up at nights and pt/wife agreed to toileting at 11 pm and 3 am to prevent attempts out of bed.  15. Spasticity - concerned about starting Any spasticity meds due to risk of sedation/confusion - suggest to be assessed for Botulinum toxin for LUE.  16. L inattention/neglect and perseveration- will d/w primary CIR physician- Dr Emeline.        Sharlet GORMAN Schmitz, PA-C 12/24/2023   I have personally performed a face to face diagnostic evaluation of this patient and formulated the key components of the plan.  Additionally, I have personally reviewed laboratory data, imaging studies, as well as relevant notes and concur with the physician assistant's documentation above.   The patient's status has not changed from the original H&P.  Any changes in documentation from the acute care chart have been noted above.

## 2023-12-25 DIAGNOSIS — I63521 Cerebral infarction due to unspecified occlusion or stenosis of right anterior cerebral artery: Secondary | ICD-10-CM | POA: Diagnosis not present

## 2023-12-25 LAB — GLUCOSE, CAPILLARY
Glucose-Capillary: 92 mg/dL (ref 70–99)
Glucose-Capillary: 101 mg/dL — ABNORMAL HIGH (ref 70–99)
Glucose-Capillary: 88 mg/dL (ref 70–99)
Glucose-Capillary: 130 mg/dL — ABNORMAL HIGH (ref 70–99)

## 2023-12-25 LAB — CBC WITH DIFFERENTIAL/PLATELET
Abs Immature Granulocytes: 0.02 K/uL (ref 0.00–0.07)
Basophils Absolute: 0.1 K/uL (ref 0.0–0.1)
Basophils Relative: 1 %
Eosinophils Absolute: 0.4 K/uL (ref 0.0–0.5)
Eosinophils Relative: 5 %
HCT: 45.9 % (ref 39.0–52.0)
Hemoglobin: 15.7 g/dL (ref 13.0–17.0)
Immature Granulocytes: 0 %
Lymphocytes Relative: 19 %
Lymphs Abs: 1.7 K/uL (ref 0.7–4.0)
MCH: 29.5 pg (ref 26.0–34.0)
MCHC: 34.2 g/dL (ref 30.0–36.0)
MCV: 86.1 fL (ref 80.0–100.0)
Monocytes Absolute: 0.6 K/uL (ref 0.1–1.0)
Monocytes Relative: 7 %
Neutro Abs: 6 K/uL (ref 1.7–7.7)
Neutrophils Relative %: 68 %
Platelets: 216 K/uL (ref 150–400)
RBC: 5.33 MIL/uL (ref 4.22–5.81)
RDW: 12.5 % (ref 11.5–15.5)
WBC: 8.8 K/uL (ref 4.0–10.5)
nRBC: 0 % (ref 0.0–0.2)

## 2023-12-25 LAB — COMPREHENSIVE METABOLIC PANEL WITH GFR
ALT: 40 U/L (ref 0–44)
AST: 30 U/L (ref 15–41)
Albumin: 3.8 g/dL (ref 3.5–5.0)
Alkaline Phosphatase: 67 U/L (ref 38–126)
Anion gap: 8 (ref 5–15)
BUN: 16 mg/dL (ref 6–20)
CO2: 26 mmol/L (ref 22–32)
Calcium: 9.2 mg/dL (ref 8.9–10.3)
Chloride: 104 mmol/L (ref 98–111)
Creatinine, Ser: 0.93 mg/dL (ref 0.61–1.24)
GFR, Estimated: >60 mL/min (ref >60)
Glucose, Bld: 97 mg/dL (ref 70–99)
Potassium: 3.8 mmol/L (ref 3.5–5.1)
Sodium: 138 mmol/L (ref 135–145)
Total Bilirubin: 0.9 mg/dL (ref 0.0–1.2)
Total Protein: 6.6 g/dL (ref 6.5–8.1)

## 2023-12-25 NOTE — Evaluation (Signed)
 Occupational Therapy Assessment and Plan  Patient Details  Name: Alan Wise MRN: [id number] Date of Birth: January 31, 1971  OT Diagnosis: cognitive deficits and hemiplegia affecting dominant side Rehab Potential: Rehab Potential (ACUTE ONLY): Good ELOS: 7-9 days   Today's Date: 12/25/2023 OT Individual Time: 9264-9154 OT Individual Time Calculation (min): 70 min     Hospital Problem: Principal Problem:   Acute right ACA stroke (HCC)   Past Medical History:  Past Medical History:  Diagnosis Date   Allergy    Complication of anesthesia    pt reports he is starting to have more difficulty arousing after surgery   Diabetes mellitus (HCC)    GERD (gastroesophageal reflux disease)    Headache    History of kidney stones    Hyperlipidemia    Renal disorder    kidney stones   Past Surgical History:  Past Surgical History:  Procedure Laterality Date   APPLICATION OF CRANIAL NAVIGATION Right 01/17/2021   Procedure: APPLICATION OF CRANIAL NAVIGATION;  Surgeon: Debby Dorn MATSU, MD;  Location: [facility] OR;  Service: Neurosurgery;  Laterality: Right;   COLONOSCOPY  09/03/1994   COLONOSCOPY WITH PROPOFOL  N/A 01/07/2023   Procedure: COLONOSCOPY WITH PROPOFOL ;  Surgeon: Jinny Carmine, MD;  Location: [facility] ENDOSCOPY;  Service: Endoscopy;  Laterality: N/A;  NOT TOO EARLY   CYSTOSCOPY WITH STENT PLACEMENT Right 01/25/2016   Procedure: CYSTOSCOPY WITH STENT PLACEMENT;  Surgeon: Redell Lynwood Napoleon, MD;  Location: [facility] ORS;  Service: Urology;  Laterality: Right;   FRAMELESS  BIOPSY WITH BRAINLAB Right 01/17/2021   Procedure: RIGHT STEREOTACTIC BIOPSY OF INSULAR LESION;  Surgeon: Debby Dorn MATSU, MD;  Location: [facility] OR;  Service: Neurosurgery;  Laterality: Right;   KNEE ARTHROSCOPY W/ MENISCAL REPAIR Right 06/23/1988   POLYPECTOMY  01/07/2023   Procedure: POLYPECTOMY;  Surgeon: Jinny Carmine, MD;  Location: [facility] ENDOSCOPY;  Service: Endoscopy;;   removal of birthmark  06/08/2013   SKIN CANCER EXCISION   06/23/2012   TYMPANOSTOMY TUBE PLACEMENT  08/06/1978   URETEROSCOPY WITH HOLMIUM LASER LITHOTRIPSY Right 01/25/2016   Procedure: URETEROSCOPY WITH HOLMIUM LASER LITHOTRIPSY;  Surgeon: Redell Lynwood Napoleon, MD;  Location: [facility] ORS;  Service: Urology;  Laterality: Right;   URETHRAL STRICTURE DILATATION     visual inspection of vocal cord     1973   WISDOM TOOTH EXTRACTION      Assessment & Plan Clinical Impression: Alan Bristol. 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 Dr. Buckley felt this was secondary to evolving radiation necrosis v/s novel small vessel infracts and was changed to Eliquis  and abilify   briefly added for ongoing behavorial issues.  He was readmitted on 12/18/23 with recurrent falls with left sided weakness. Repeat MRI brain done revaling new 3.2 X 1.3 cm focus of right basal ganglia infarct  and recent right temporal restricted diffusion no longer present. EEG was negative for seizures and showed cortical dysfunction arising from right fronto-temporal region likely due to underlying structural abnormality. Neurology recommended increasing Lamictal  dose for possible partial seizures.   He continued to have waxing and waning of baseline aphasia w/non verbal state at times and left sided weakness as well as left  gaze preference with left hemianopia and left facial droop on 07/01 per exam by Dr. Michaela. 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 MRI brain repeated revealing progressive restricted diffusion mid and distal R-ACA territory infarct, ongoing multifocal right MCA deep  white and gray matter unchanged from 06/27 and underlying chronic ischemic disease with encephalomalacia.MBS done revealing mild pharyngeal and primary esophageal dysphagia with moderate retention in upper esophagus and all aspiration events were audible and minimal-->D3, thins and no straws as well as GI consult to see if he's candidate for esophageal intervention.  Carotid dopplers without significant ICA stenosis.  Patient transferred to CIR on 12/24/2023 .    Patient currently requires min with basic self-care skills secondary to muscle weakness and muscle joint tightness, decreased cardiorespiratoy endurance, impaired timing and sequencing, abnormal tone, unbalanced muscle activation, and decreased coordination, decreased visual perceptual skills, decreased visual motor skills, and hemianopsia, decreased attention to left, decreased safety awareness, central origin, and decreased sitting balance, decreased standing balance, decreased postural control, hemiplegia, and decreased balance strategies.  Prior to hospitalization, patient could complete ADLs with supervision.  Patient will benefit from skilled intervention to decrease level of assist with basic self-care skills and increase independence with basic self-care skills prior to discharge home with care partner.  Anticipate patient will require 24 hour supervision and follow up outpatient.  OT - End of Session Activity Tolerance: Decreased this session Endurance Deficit: Yes OT Assessment Rehab Potential (ACUTE ONLY): Good OT Patient demonstrates impairments in the following area(s): Balance;Vision;Cognition;Endurance;Motor;Perception;Safety;Sensory OT Basic ADL's Functional Problem(s): Grooming;Bathing;Dressing;Toileting OT Transfers Functional Problem(s): Tub/Shower;Toilet OT Additional Impairment(s): Fuctional Use of Upper Extremity OT Plan OT Intensity: Minimum of 1-2 x/day, 45 to 90 minutes OT Frequency: 5 out of 7 days OT  Duration/Estimated Length of Stay: 7-9 days OT Treatment/Interventions: Balance/vestibular training;DME/adaptive equipment instruction;Patient/family education;Therapeutic Activities;Cognitive remediation/compensation;Functional electrical stimulation;Psychosocial support;Therapeutic Exercise;Community reintegration;Self Care/advanced ADL retraining;UE/LE Strength taining/ROM;Functional mobility training;Discharge planning;Neuromuscular re-education;Skin care/wound managment;UE/LE Coordination activities;Disease mangement/prevention;Pain management;Splinting/orthotics;Visual/perceptual remediation/compensation OT Self Feeding Anticipated Outcome(s): Mod I OT Basic Self-Care Anticipated Outcome(s): SUP OT Toileting Anticipated Outcome(s): SUP OT Bathroom Transfers Anticipated Outcome(s): SUP-CGA OT Recommendation Recommendations for Other Services: Therapeutic Recreation consult Therapeutic Recreation Interventions: Pet therapy Patient destination: Home Follow Up Recommendations: Outpatient OT Equipment Recommended: To be determined   OT Evaluation Precautions/Restrictions  Precautions Precautions: Fall Recall of Precautions/Restrictions: Intact Precaution/Restrictions Comments: L visual field cut at baseline Restrictions Weight Bearing Restrictions Per Provider Order: No Other Position/Activity Restrictions: L visual field cut at baseline General Chart Reviewed: Yes Additional Pertinent History: T2DM- A1c 5.4, GERD, renal calculi, 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, multifocal CVA and severe expressive/receptive aphasia Family/Caregiver Present: No Vital Signs Therapy Vitals Temp: 98.2 F (36.8 C) Temp Source: Oral Pulse Rate: 73 Resp: 16 BP: 121/79 Patient Position (if appropriate): Lying Oxygen  Therapy SpO2: 93 % O2 Device: Room Air Pain Pain Assessment Pain Scale: 0-10 Pain Score: 0-No pain Home  Living/Prior Functioning Home Living Available Help at Discharge: Family, Available 24 hours/day Type of Home: House Home Access: Stairs to enter Secretary/administrator of Steps: 4 Entrance Stairs-Rails: Right, Left Home Layout: Two level, Able to live on main level with bedroom/bathroom Alternate Level Stairs-Number of Steps: 5 steps then 10 steps Alternate Level Stairs-Rails: Left, Right Bathroom Shower/Tub: Walk-in shower, Door [employer] Toilet: Standard Bathroom Accessibility: Yes Additional Comments: built in shower seat in upstairs shower; no seated in downstairs shower; per  chart review, Spouse reports fall 1wk ago  Lives With: Spouse IADL History Homemaking Responsibilities: No Current License: Yes (has not driven since CVA in March) Education: PHD Occupation: Other (comment) (has not been working since first CVA) Type of Occupation: Professor at [employer] Prior Function Level of Independence: Other (comment) (he has required SUP for ADLs since CVA in March)  Able to Take Stairs?: Yes Driving: No Vision Baseline Vision/History:  (L field cut from CVA in March) Ability to See in Adequate Light: 0 Adequate Patient Visual Report: No change from baseline (difficult to assess d/t aphasia, Pt seems to report no change from this CVA) Vision Assessment?: Yes Eye Alignment: Within Functional Limits Ocular Range of Motion: Within Functional Limits Alignment/Gaze Preference: Within Defined Limits Tracking/Visual Pursuits: Requires cues, head turns, or add eye shifts to track Convergence: Impaired (comment) (no double vision repored, unsure if Pt understoof directions of assessment) Visual Fields: Left visual field deficit;Left homonymous hemianopsia (presents to be inferiorlly from assessment, will continue to assess) Additional Comments: L field cut in L eye at baseline, Pt did not note any difference with vision in R eyes Perception  Perception: Impaired Perception-Other Comments:  Mild L inattention noted, however may be d/t L field cut Praxis Praxis: WFL (will continue to assess, intact for familiar ADLs) Cognition Cognition Overall Cognitive Status: Impaired/Different from baseline (difficult to assess d/t expressive aphasia) Arousal/Alertness: Awake/alert Orientation Level: Person;Place;Situation Person: Oriented Place: Oriented Situation: Oriented Memory: Impaired Attention: Sustained Sustained Attention: Appears intact Safety/Judgment: Impaired Brief Interview for Mental Status (BIMS) Repetition of Three Words (First Attempt): 3 (dificult to compelte assessment d/t expressive aphasia) Temporal Orientation: Year: No answer Temporal Orientation: Month: No answer Temporal Orientation: Day: No answer Recall: Sock: No answer Recall: Blue: No answer Recall: Bed: No answer BIMS Summary Score: 99 Sensation Sensation Light Touch: Impaired by gross assessment Hot/Cold: Appears Intact Proprioception: Impaired by gross assessment Stereognosis: Not tested Coordination Gross Motor Movements are Fluid and Coordinated: No Fine Motor Movements are Fluid and Coordinated: No Coordination and Movement Description: posterior bias with decreased coordination of L hemi-body Finger Nose Finger Test: unable to complete on L, smooth equal movements on R Motor  Motor Motor: Hemiplegia Motor - Skilled Clinical Observations: Mild hemiplegia L UE/LE  Trunk/Postural Assessment  Cervical Assessment Cervical Assessment: Exceptions to [facility] (forward head) Thoracic Assessment Thoracic Assessment: Exceptions to [facility] (rounded shoulders) Lumbar Assessment Lumbar Assessment: Exceptions to [facility] (posterior pelvic tilt) Postural Control Postural Control: Deficits on evaluation Righting Reactions: delayed Protective Responses: delayed  Balance Balance Balance Assessed: Yes Static Sitting Balance Static Sitting - Balance Support: Feet unsupported Static Sitting - Level of  Assistance: 5: Stand by assistance Dynamic Sitting Balance Dynamic Sitting - Balance Support: During functional activity;Feet unsupported Dynamic Sitting - Level of Assistance: 5: Stand by assistance Static Standing Balance Static Standing - Balance Support: During functional activity;Left upper extremity supported (CGA) Static Standing - Level of Assistance: 5: Stand by assistance;4: Min assist Dynamic Standing Balance Dynamic Standing - Balance Support: During functional activity;Left upper extremity supported Dynamic Standing - Level of Assistance: 4: Min assist;5: Stand by assistance (CGA-min A) Extremity/Trunk Assessment RUE Assessment RUE Assessment: Within Functional Limits LUE Assessment LUE Assessment: Exceptions to [facility] Active Range of Motion (AROM) Comments: limited to 90* at shoulder General Strength Comments: 3-/5 at shoulder, 4/5 bicep/tricep, grasp 3+/5  Care Tool Care Tool Self Care Eating   Eating Assist Level: Set up assist    Oral Care    Oral Care Assist Level: Minimal  Assistance - Patient > 75%    Bathing   Body parts bathed by patient: Right arm;Left arm;Chest;Abdomen;Front perineal area;Right upper leg;Left upper leg;Right lower leg;Left lower leg;Face Body parts bathed by helper: Buttocks;Right arm   Assist Level: Maximal Assistance - Patient 24 - 49%    Upper Body Dressing(including orthotics)   What is the patient wearing?: Pull over shirt   Assist Level: Minimal Assistance - Patient > 75%    Lower Body Dressing (excluding footwear)   What is the patient wearing?: Underwear/pull up;Pants Assist for lower body dressing: Minimal Assistance - Patient > 75%    Putting on/Taking off footwear     Assist for footwear: Moderate Assistance - Patient 50 - 74%       Care Tool Toileting Toileting activity   Assist for toileting: Moderate Assistance - Patient 50 - 74%     Care Tool Bed Mobility Roll left and right activity   Roll left and right assist  level: Supervision/Verbal cueing    Sit to lying activity   Sit to lying assist level: Contact Guard/Touching assist    Lying to sitting on side of bed activity   Lying to sitting on side of bed assist level: the ability to move from lying on the back to sitting on the side of the bed with no back support.: Contact Guard/Touching assist     Care Tool Transfers Sit to stand transfer   Sit to stand assist level: Contact Guard/Touching assist    Chair/bed transfer   Chair/bed transfer assist level: Minimal Assistance - Patient > 75%     Toilet transfer   Assist Level: Minimal Assistance - Patient > 75%     Care Tool Cognition  Expression of Ideas and Wants Expression of Ideas and Wants: 2. Frequent difficulty - frequently exhibits difficulty with expressing needs and ideas  Understanding Verbal and Non-Verbal Content Understanding Verbal and Non-Verbal Content: 3. Usually understands - understands most conversations, but misses some part/intent of message. Requires cues at times to understand   Memory/Recall Ability Memory/Recall Ability : That he or she is in a hospital/hospital unit   Refer to Care Plan for Long Term Goals  SHORT TERM GOAL WEEK 1 OT Short Term Goal 1 (Week 1): STG=LTG d/t ELOS  Recommendations for other services: Therapeutic Recreation  Pet therapy   Skilled Therapeutic Intervention Skilled Therapeutic Interventions/Progress Updates:  1:1 OT evaluation and intervention initiated with skilled education provided on OT role, goals, and POC. Pt received semi-reclined in bed with nursing staff present in room. Pt presenting to be in good spirits receptive to skilled OT session reporting 0/10 pain- OT offering intermittent rest breaks, repositioning, and therapeutic support to optimize participation in therapy session. Pt with expressive > receptive aphasia during session- utilized yes/no questions throughout session to optimize communication. Pt completed ADLs listed  below this session with largest deficits noted in L UE functional use, balance, activity tolerance, L visual field deficit, and L side attention. He was noted to have development of flexor tone in L elbow. Pt able to consistently follow verbal cues throughout session without difficulty. He would benefit from continued OT services in IPR setting. Pt was left resting in bed with call bell in reach, bed alarm on, and all needs met.   ADL ADL Eating: Set up Where Assessed-Eating: Bed level Grooming: Minimal assistance Where Assessed-Grooming: Standing at sink Upper Body Bathing: Minimal assistance Where Assessed-Upper Body Bathing: Shower Lower Body Bathing: Minimal assistance Where Assessed-Lower Body Bathing: Shower  Upper Body Dressing: Minimal assistance Where Assessed-Upper Body Dressing: Edge of bed Lower Body Dressing: Minimal assistance Where Assessed-Lower Body Dressing: Edge of bed Toileting: Minimal assistance Where Assessed-Toileting: Teacher, adult education: Curator Method: Insurance claims handler: Not assessed Film/video editor: Insurance underwriter Method: Designer, industrial/product: Grab bars;Shower seat without back Mobility  Bed Mobility Bed Mobility: Rolling Right;Rolling Left;Sit to Supine;Supine to Sit Rolling Right: Supervision/verbal cueing Rolling Left: Supervision/Verbal cueing Supine to Sit: Contact Guard/Touching assist Sit to Supine: Contact Guard/Touching assist Transfers Sit to Stand: Contact Guard/Touching assist Stand to Sit: Contact Guard/Touching assist   Discharge Criteria: Patient will be discharged from OT if patient refuses treatment 3 consecutive times without medical reason, if treatment goals not met, if there is a change in medical status, if patient makes no progress towards goals or if patient is discharged from hospital.  The above assessment, treatment plan, treatment  alternatives and goals were discussed and mutually agreed upon: by patient  Katheryn SHAUNNA Mines 12/25/2023, 8:56 AM

## 2023-12-25 NOTE — Progress Notes (Signed)
 Inpatient Rehabilitation Admission Medication Review by a Pharmacist  A complete drug regimen review was completed for this patient to identify any potential clinically significant medication issues.  High Risk Drug Classes Is patient taking? Indication by Medication  Antipsychotic Yes, as an intravenous medication Compazine -n/v   Anticoagulant Yes Eliquis - AF/CVA   Antibiotic No   Opioid No   Antiplatelet No   Hypoglycemics/insulin  No   Vasoactive Medication Yes Amlodipine , propranolol - HTN   Chemotherapy No   Other Yes fleet enema , bisacodyl  , and - constipation Maalox- indigestion Diphenhydramine - itching  Acetaminophen - pain  Robitussin- cough   melatonin and -insomnia Lipitor- HLD  Lamictal - sz      Type of Medication Issue Identified Description of Issue Recommendation(s)  Drug Interaction(s) (clinically significant)     Duplicate Therapy     Allergy     No Medication Administration End Date     Incorrect Dose     Additional Drug Therapy Needed     Significant med changes from prior encounter (inform family/care partners about these prior to discharge). Lamictal  dose increase from 150 BID to 150 QAM, 200 QPM   PTA meds not resumed- bASA, abilify  (was not taking PTA)  Communicate relevant medication changes to patient/family members at discharge from CIR.   Restart or discontinue PTA meds not resumed in CIR at discharge if clinically indicated.   Other       Clinically significant medication issues were identified that warrant physician communication and completion of prescribed/recommended actions by midnight of the next day:  No  Name of provider notified for urgent issues identified:   Provider Method of Notification:     Pharmacist comments: Based on acute care notes and PTA med rec patient had issues with medication compliance PTA. Keep medication regimens as simple as possible.   Time spent performing this drug regimen review (minutes):   20  Massie Fila, PharmD Clinical Pharmacist  12/25/2023 7:59 AM

## 2023-12-25 NOTE — Evaluation (Addendum)
 Speech Language Pathology Assessment and Plan  Patient Details  Name: Alan Wise MRN: [id number] Date of Birth: May 19, 1971  SLP Diagnosis: Aphasia;Speech and Language deficits;Dysphagia;Cognitive Impairments  Rehab Potential: Fair ELOS: 7-10 days    Today's Date: 12/25/2023 SLP Individual Time: 9084-8984 SLP Individual Time Calculation (min): 60 min   Hospital Problem: Principal Problem:   Acute right ACA stroke (HCC)  Past Medical History:  Past Medical History:  Diagnosis Date   Allergy    Complication of anesthesia    pt reports he is starting to have more difficulty arousing after surgery   Diabetes mellitus (HCC)    GERD (gastroesophageal reflux disease)    Headache    History of kidney stones    Hyperlipidemia    Renal disorder    kidney stones   Past Surgical History:  Past Surgical History:  Procedure Laterality Date   APPLICATION OF CRANIAL NAVIGATION Right 01/17/2021   Procedure: APPLICATION OF CRANIAL NAVIGATION;  Surgeon: Debby Dorn MATSU, MD;  Location: [facility] OR;  Service: Neurosurgery;  Laterality: Right;   COLONOSCOPY  09/03/1994   COLONOSCOPY WITH PROPOFOL  N/A 01/07/2023   Procedure: COLONOSCOPY WITH PROPOFOL ;  Surgeon: Jinny Carmine, MD;  Location: [facility] ENDOSCOPY;  Service: Endoscopy;  Laterality: N/A;  NOT TOO EARLY   CYSTOSCOPY WITH STENT PLACEMENT Right 01/25/2016   Procedure: CYSTOSCOPY WITH STENT PLACEMENT;  Surgeon: Redell Lynwood Napoleon, MD;  Location: [facility] ORS;  Service: Urology;  Laterality: Right;   FRAMELESS  BIOPSY WITH BRAINLAB Right 01/17/2021   Procedure: RIGHT STEREOTACTIC BIOPSY OF INSULAR LESION;  Surgeon: Debby Dorn MATSU, MD;  Location: [facility] OR;  Service: Neurosurgery;  Laterality: Right;   KNEE ARTHROSCOPY W/ MENISCAL REPAIR Right 06/23/1988   POLYPECTOMY  01/07/2023   Procedure: POLYPECTOMY;  Surgeon: Jinny Carmine, MD;  Location: [facility] ENDOSCOPY;  Service: Endoscopy;;   removal of birthmark  06/08/2013   SKIN CANCER EXCISION   06/23/2012   TYMPANOSTOMY TUBE PLACEMENT  08/06/1978   URETEROSCOPY WITH HOLMIUM LASER LITHOTRIPSY Right 01/25/2016   Procedure: URETEROSCOPY WITH HOLMIUM LASER LITHOTRIPSY;  Surgeon: Redell Lynwood Napoleon, MD;  Location: [facility] ORS;  Service: Urology;  Laterality: Right;   URETHRAL STRICTURE DILATATION     visual inspection of vocal cord     1973   WISDOM TOOTH EXTRACTION      Assessment / Plan / Recommendation Clinical Impression Pt is a 53 year old L handed male known to this SLP from previous CIR stay. 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 Dr. Buckley felt this was secondary to evolving radiation necrosis v/s novel small vessel infracts and was changed to Eliquis  and abilify   briefly added for ongoing behavorial issues.  He was readmitted on 12/18/23 with recurrent falls with left sided weakness. Repeat MRI brain done revaling new 3.2 X 1.3 cm focus of right basal ganglia infarct  and recent right temporal restricted diffusion no longer present. EEG was negative for seizures and showed cortical dysfunction arising from right fronto-temporal region likely due to underlying structural abnormality. Neurology recommended increasing Lamictal  dose for possible partial seizures.   He continued to have waxing and waning of baseline aphasia w/non verbal state at times and left sided weakness  as well as left gaze preference with left hemianopia and left facial droop on 07/01 per exam by Dr. Michaela. 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 MRI brain repeated revealing progressive restricted diffusion mid and distal R-ACA  territory infarct, ongoing multifocal right MCA deep white and gray matter unchanged from 06/27 and underlying chronic ischemic disease with encephalomalacia.   Cognitive-Linguistic:  Pt participated in portions of the Bedside WAB, which revealed severe expressive deficits and moderate receptive deficits. Pt often attempted to respond w/ phrase to sentence length responses, though speech was filled with jargon and repetitive phrases. He was able to verbalize 1-10 and dow @ minA level and months @ maxA level. Severe deficits noted during responsive and confrontational naming subtests. He was able to answer simple Y/N questions w/ 100% accuracy and complex Y/N questions w/ 90% accuracy. However, moderate difficulty noted following 2 step commands and pt was unable to follow 3 step commands. He demonstrated moderate deficits when writing his first/last name and was unable to copy single words. Reading comprehension not assessed d/t visual deficits. Pt appeared to present w/ severe L visual attention deficits. Additionally, minimal to no awareness of errors noted. No concerns re dysarthria.  His parents were present as SLP was finishing eval tasks and reported noticing improvements in his aphasia the last few weeks before this (recent admissions). Anticipate decline given recent CVAs, as his cognitive-linguistic deficits appear c/w his functioning when he d/c from CIR earlier this year.    Bedside Swallow: Brief bedside swallow screen completed w/ thin liquids via cup and straw. Pt presented w/ slight cough during 2/3 trials via straw and no overt s/s of airway invasion noted w/ thin liquids via cup. Pt's nurse reported no concerns re dysphagia w/ consumption of meds this morning. MBSS completed 7/3 revealed a mild pharyngeal dysphagia and esophageal dysphagia and continuation of previously recommended diet: Dys 3 textures and thin liquids (No Straws). Recommend continued Dys 3 and thin liquids at this time.  Also recommend intermittent supervision during meals given report of impulsivity from staff. Will continue to functionally monitor diet tolerance and complete further eval as needed.    He would benefit from skilled ST services to target aforementioned deficits, maximize pt communication and subsequent  independence, and facilitate return to prev roles/responsibilities.     Skilled Therapeutic Interventions          SLP facilitated a cognitive-linguistic evaluation and brief bedside swallow screen to assess pt's cognitive-communication skills and determine need for additional skilled ST services. See above for more information.    SLP Assessment  Patient will need skilled Speech Lanaguage Pathology Services during CIR admission    Recommendations  SLP Diet Recommendations: Dysphagia 3 (Mech soft);Thin Liquid Administration via: Cup Medication Administration: Whole meds with liquid Supervision: Patient able to self feed Compensations: Minimize environmental distractions;Slow rate;Small sips/bites Postural Changes and/or Swallow Maneuvers: Upright 30-60 min after meal;Seated upright 90 degrees Oral Care Recommendations: Oral care BID Recommendations for Other Services: Neuropsych consult Patient destination: Home Follow up Recommendations: Outpatient SLP;24 hour supervision/assistance Equipment Recommended: None recommended by SLP    SLP Frequency 3 to 5 out of 7 days   SLP Duration  SLP Intensity  SLP Treatment/Interventions 7-10 days  Minumum of 1-2 x/day, 30 to 90 minutes  Cognitive remediation/compensation;Dysphagia/aspiration precaution training;Internal/external aids;Speech/Language facilitation;Cueing hierarchy;Environmental controls;Therapeutic Activities;Therapeutic Exercise;Patient/family education;Multimodal communication approach;Functional tasks    Pain  None reported  Prior Functioning  See clinical  impression  SLP Evaluation Cognition Overall Cognitive Status:  Difficult to assess Arousal/Alertness: Awake/alert Orientation Level: Oriented to person;Oriented to time Year: 2025 Month: July Attention: Sustained Sustained Attention: Appears intact Awareness: Impaired Awareness Impairment: Intellectual impairment  Comprehension Auditory Comprehension Overall Auditory Comprehension: Impaired Yes/No Questions: Impaired Basic Biographical Questions: 76-100% accurate Basic Immediate Environment Questions: 75-100% accurate Complex Questions: 75-100% accurate Commands: Impaired One Step Basic Commands: 50-74% accurate Two Step Basic Commands: 25-49% accurate Multistep Basic Commands: 0-24% accurate Conversation: Simple EffectiveTechniques: Repetition;Stressing words Reading Comprehension Reading Status: Unable to assess (comment) (d/t visual deficits) Expression Expression Primary Mode of Expression: Verbal Verbal Expression Overall Verbal Expression: Impaired Automatic Speech: Counting;Day of week;Month of year Level of Generative/Spontaneous Verbalization: Phrase;Word Repetition: Impaired Level of Impairment: Word level Naming: Impairment Responsive: 0-25% accurate Confrontation: Impaired Convergent: 0-24% accurate Divergent: 0-24% accurate Verbal Errors: Jargon;Perseveration Pragmatics: No impairment Interfering Components: Premorbid deficit Written Expression Dominant Hand: Left Written Expression: Exceptions to [facility] Oral Motor Oral Motor/Sensory Function Overall Oral Motor/Sensory Function: Within functional limits Motor Speech Overall Motor Speech: Appears within functional limits for tasks assessed  Care Tool Care Tool Cognition Ability to hear (with hearing aid or hearing appliances if normally used Ability to hear (with hearing aid or hearing appliances if normally used): 0. Adequate - no difficulty in normal conservation, social interaction, listening to TV   Expression of Ideas and Wants Expression of Ideas and Wants: 1.  Rarely/Never expressess or very difficult - rarely/never expresses self or speech is very difficult to understand   Understanding Verbal and Non-Verbal Content Understanding Verbal and Non-Verbal Content: 3. Usually understands - understands most conversations, but misses some part/intent of message. Requires cues at times to understand  Memory/Recall Ability Memory/Recall Ability : Current season;That he or she is in a hospital/hospital unit   Motor Speech Assessment  WFL  Bedside Swallowing Assessment General Previous Swallow Assessment: MBSS 7/3 recommended Dys3 textures/thin liquids Diet Prior to this Study: Dysphagia 3 (mechanical soft);Thin liquids (Level 0) Temperature Spikes Noted: No Respiratory Status: Room air History of Recent Intubation: No Behavior/Cognition: Alert;Cooperative Oral Cavity - Dentition: Adequate natural dentition Patient Positioning: Upright in bed Baseline Vocal Quality: Normal Volitional Swallow: Able to elicit    Short Term Goals: Week 1: SLP Short Term Goal 1 (Week 1): STGs = LTGs d/t ELOS  Refer to Care Plan for Long Term Goals  Recommendations for other services: Neuropsych  Discharge Criteria: Patient will be discharged from SLP if patient refuses treatment 3 consecutive times without medical reason, if treatment goals not met, if there is a change in medical status, if patient makes no progress towards goals or if patient is discharged from hospital.  The above assessment, treatment plan, treatment alternatives and goals were discussed and mutually agreed upon: by patient  [name] 12/25/2023, 7:30 PM

## 2023-12-25 NOTE — Evaluation (Signed)
 Physical Therapy Assessment and Plan  Patient Details  Name: Alan Wise MRN: [id number] Date of Birth: 1971/04/15  PT Diagnosis: Difficulty walking, Hemiparesis non-dominant, Hypertonia, Impaired cognition, and Muscle weakness Rehab Potential: Good ELOS: 7-9 days   Today's Date: 12/25/2023 PT Individual Time: 1102-1203 PT Individual Time Calculation (min): 61 min    Hospital Problem: Principal Problem:   Acute right ACA stroke (HCC)   Past Medical History:  Past Medical History:  Diagnosis Date   Allergy    Complication of anesthesia    pt reports he is starting to have more difficulty arousing after surgery   Diabetes mellitus (HCC)    GERD (gastroesophageal reflux disease)    Headache    History of kidney stones    Hyperlipidemia    Renal disorder    kidney stones   Past Surgical History:  Past Surgical History:  Procedure Laterality Date   APPLICATION OF CRANIAL NAVIGATION Right 01/17/2021   Procedure: APPLICATION OF CRANIAL NAVIGATION;  Surgeon: Debby Dorn MATSU, MD;  Location: [facility] OR;  Service: Neurosurgery;  Laterality: Right;   COLONOSCOPY  09/03/1994   COLONOSCOPY WITH PROPOFOL  N/A 01/07/2023   Procedure: COLONOSCOPY WITH PROPOFOL ;  Surgeon: Jinny Carmine, MD;  Location: [facility] ENDOSCOPY;  Service: Endoscopy;  Laterality: N/A;  NOT TOO EARLY   CYSTOSCOPY WITH STENT PLACEMENT Right 01/25/2016   Procedure: CYSTOSCOPY WITH STENT PLACEMENT;  Surgeon: Redell Lynwood Napoleon, MD;  Location: [facility] ORS;  Service: Urology;  Laterality: Right;   FRAMELESS  BIOPSY WITH BRAINLAB Right 01/17/2021   Procedure: RIGHT STEREOTACTIC BIOPSY OF INSULAR LESION;  Surgeon: Debby Dorn MATSU, MD;  Location: [facility] OR;  Service: Neurosurgery;  Laterality: Right;   KNEE ARTHROSCOPY W/ MENISCAL REPAIR Right 06/23/1988   POLYPECTOMY  01/07/2023   Procedure: POLYPECTOMY;  Surgeon: Jinny Carmine, MD;  Location: [facility] ENDOSCOPY;  Service: Endoscopy;;   removal of birthmark  06/08/2013   SKIN CANCER  EXCISION  06/23/2012   TYMPANOSTOMY TUBE PLACEMENT  08/06/1978   URETEROSCOPY WITH HOLMIUM LASER LITHOTRIPSY Right 01/25/2016   Procedure: URETEROSCOPY WITH HOLMIUM LASER LITHOTRIPSY;  Surgeon: Redell Lynwood Napoleon, MD;  Location: [facility] ORS;  Service: Urology;  Laterality: Right;   URETHRAL STRICTURE DILATATION     visual inspection of vocal cord     1973   WISDOM TOOTH EXTRACTION      Assessment & Plan Clinical Impression: Patient is a 53 y.o. year old male with recent admission to the hospital on *** with ***.  Patient transferred to CIR on 12/24/2023 .   Patient currently requires {ONJ:6958369} with mobility secondary to {impairments:3041632}.  Prior to hospitalization, patient was {ONJ:6958369} with mobility and lived with Spouse in a House home.  Home access is 4Stairs to enter.  Patient will benefit from skilled PT intervention to {benefits:22816} for planned discharge {planned discharge:3041670}.  Anticipate patient will {follow le:6958327} at discharge.  PT - End of Session Endurance Deficit: Yes PT Assessment Rehab Potential (ACUTE/IP ONLY): Good PT Barriers to Discharge: Insurance for SNF coverage PT Patient demonstrates impairments in the following area(s): Balance;Endurance;Motor;Perception;Safety;Sensory PT Transfers Functional Problem(s): Bed Mobility;Bed to Chair;Car;Furniture PT Locomotion Functional Problem(s): Ambulation;Wheelchair Mobility;Stairs PT Plan PT Intensity: Minimum of 1-2 x/day ,45 to 90 minutes PT Frequency: 5 out of 7 days PT Duration Estimated Length of Stay: 7-9 days PT Treatment/Interventions: Ambulation/gait training;Community reintegration;DME/adaptive equipment instruction;Neuromuscular re-education;Psychosocial support;Stair training;UE/LE Strength taining/ROM;UE/LE Coordination activities;Wheelchair propulsion/positioning;Therapeutic Activities;Skin care/wound management;Pain management;Functional electrical stimulation;Balance/vestibular  training;Discharge planning;Cognitive remediation/compensation;Disease management/prevention;Functional mobility training;Patient/family education;Splinting/orthotics;Therapeutic Exercise;Visual/perceptual remediation/compensation PT Transfers Anticipated Outcome(s): supervision  PT Locomotion Anticipated Outcome(s): supervision PT Recommendation Follow Up Recommendations: Home health PT;Outpatient PT;24 hour supervision/assistance Patient destination: Home Equipment Recommended: To be determined   PT Evaluation Precautions/Restrictions Precautions Precautions: Fall Precaution/Restrictions Comments: L visual field cut at baseline Restrictions Weight Bearing Restrictions Per Provider Order: No General   Vital SignsTherapy Vitals Temp: 98 F (36.7 C) Temp Source: Oral BP: 130/77 Patient Position (if appropriate): Lying Oxygen  Therapy SpO2: 94 % O2 Device: Room Air Pain Pain Assessment Pain Scale: 0-10 Pain Score: 0-No pain Pain Interference Pain Interference Pain Effect on Sleep: 8. Unable to answer Pain Interference with Therapy Activities: 8. Unable to answer Pain Interference with Day-to-Day Activities: 8. Unable to answer Home Living/Prior Functioning Home Living Available Help at Discharge: Family;Available 24 hours/day Type of Home: House Home Access: Stairs to enter [employer] of Steps: 4 Entrance Stairs-Rails: Right;Left Home Layout: Two level;Able to live on main level with bedroom/bathroom Alternate Level Stairs-Number of Steps: 5 plus 10 steps Alternate Level Stairs-Rails: Right;Left Bathroom Shower/Tub: Walk-in shower;Door Bathroom Toilet: Standard Bathroom Accessibility: Yes Additional Comments: built in shower seat in upstairs shower; no seat in downstairs shower; per chart review.  Lives With: Spouse Prior Function Level of Independence: Needs assistance with ADLs;Needs assistance with homemaking  Able to Take Stairs?: Yes Driving:  No Vision/Perception  Vision - History Ability to See in Adequate Light: 0 Adequate Vision - Assessment Eye Alignment: Within Functional Limits Ocular Range of Motion: Within Functional Limits Alignment/Gaze Preference: Head turned;Gaze right Perception Perception: Impaired Preception Impairment Details: Inattention/Neglect Praxis Praxis: WFL  Cognition Overall Cognitive Status: Difficult to assess (d/t aphasia; pt has cog dificits at baseline) Arousal/Alertness: Awake/alert Orientation Level: Oriented to person Attention: Sustained Sustained Attention: Appears intact Memory: Impaired Awareness: Impaired Executive Function: Self Correcting;Self Monitoring Self Monitoring: Impaired Self Correcting: Impaired Behaviors: Impulsive Safety/Judgment: Impaired Sensation Sensation Light Touch: Impaired by gross assessment Proprioception: Impaired by gross assessment Coordination Gross Motor Movements are Fluid and Coordinated: No Fine Motor Movements are Fluid and Coordinated: No Coordination and Movement Description: posterior bias with decreased coordination of L hemi-body with hemipareisis Heel Shin Test: unable to correctly follow verbal instructions. with visual, WFL for RLE and dyscoordination with LLE Motor  Motor Motor: Hemiplegia Motor - Skilled Clinical Observations: L hemipareisis with coordination deficits   Trunk/Postural Assessment  Cervical Assessment Cervical Assessment: Exceptions to [facility] (forward head) Thoracic Assessment Thoracic Assessment: Exceptions to [facility] (rounded shoulders) Lumbar Assessment Lumbar Assessment: Exceptions to [facility] (posterior pelvic tilt) Postural Control Postural Control: Deficits on evaluation Righting Reactions: delayed Protective Responses: delayed  Balance Balance Balance Assessed: Yes Standardized Balance Assessment Standardized Balance Assessment: Timed Up and Go Test Timed Up and Go Test TUG: Normal TUG Normal TUG (seconds):  18.9 Static Sitting Balance Static Sitting - Balance Support: Feet unsupported Static Sitting - Level of Assistance: 5: Stand by assistance Dynamic Sitting Balance Dynamic Sitting - Balance Support: During functional activity;Feet unsupported Dynamic Sitting - Level of Assistance: 5: Stand by assistance Static Standing Balance Static Standing - Balance Support: During functional activity;Left upper extremity supported Static Standing - Level of Assistance: Other (comment) (CGA) Dynamic Standing Balance Dynamic Standing - Level of Assistance: 4: Min assist Extremity Assessment      RLE Assessment RLE Assessment: Exceptions to [facility] RLE Strength RLE Overall Strength: Deficits Right Hip Flexion: 4+/5 Right Hip Extension: 4+/5 Right Hip ABduction: 4+/5 Right Hip ADduction: 4/5 Right Knee Flexion: 4+/5 Right Knee Extension: 4+/5 Right Ankle Dorsiflexion: 4+/5 Right Ankle Plantar Flexion: 4+/5 LLE Assessment LLE  Assessment: Exceptions to [facility] LLE Strength Left Hip Flexion: 3-/5 Left Hip Extension: 3-/5 Left Hip ABduction: 3+/5 Left Hip ADduction: 3+/5 Left Knee Flexion: 3/5 Left Knee Extension: 3-/5 Left Ankle Dorsiflexion: 3-/5 Left Ankle Plantar Flexion: 3-/5  Care Tool Care Tool Bed Mobility Roll left and right activity   Roll left and right assist level: Supervision/Verbal cueing    Sit to lying activity   Sit to lying assist level: Supervision/Verbal cueing    Lying to sitting on side of bed activity   Lying to sitting on side of bed assist level: the ability to move from lying on the back to sitting on the side of the bed with no back support.: Supervision/Verbal cueing     Care Tool Transfers Sit to stand transfer   Sit to stand assist level: Contact Guard/Touching assist    Chair/bed transfer   Chair/bed transfer assist level: Contact Guard/Touching assist    Car transfer   Car transfer assist level: Contact Guard/Touching assist      Care Tool  Locomotion Ambulation   Assist level: Minimal Assistance - Patient > 75%   Max distance: 150 ft  Walk 10 feet activity   Assist level: Contact Guard/Touching assist Assistive device: Hand held assist   Walk 50 feet with 2 turns activity   Assist level: Minimal Assistance - Patient > 75% Assistive device: Hand held assist  Walk 150 feet activity   Assist level: Minimal Assistance - Patient > 75% Assistive device: Hand held assist  Walk 10 feet on uneven surfaces activity Walk 10 feet on uneven surfaces activity did not occur: Safety/medical concerns      Stairs   Assist level: Contact Guard/Touching assist Stairs assistive device: 2 hand rails Max number of stairs: 12  Walk up/down 1 step activity   Walk up/down 1 step (curb) assist level: Contact Guard/Touching assist Walk up/down 1 step or curb assistive device: 2 hand rails  Walk up/down 4 steps activity   Walk up/down 4 steps assist level: Contact Guard/Touching assist Walk up/down 4 steps assistive device: 2 hand rails  Walk up/down 12 steps activity   Walk up/down 12 steps assist level: Contact Guard/Touching assist Walk up/down 12 steps assistive device: 2 hand rails  Pick up small objects from floor   Pick up small object from the floor assist level: Minimal Assistance - Patient > 75%    Wheelchair Is the patient using a wheelchair?: Yes     Wheelchair assist level: Dependent - Patient 0% Max wheelchair distance: 200 ft  Wheel 50 feet with 2 turns activity   Assist Level: Dependent - Patient 0%  Wheel 150 feet activity   Assist Level: Dependent - Patient 0%    Refer to Care Plan for Long Term Goals  SHORT TERM GOAL WEEK 1 PT Short Term Goal 1 (Week 1): STG = LTG d/t ELOS  Recommendations for other services: {RECOMMENDATIONS FOR OTHER SERVICES:3049016}  Skilled Therapeutic Intervention Mobility Bed Mobility Bed Mobility: Rolling Right;Rolling Left;Sit to Supine;Supine to Sit Rolling Right:  Supervision/verbal cueing Rolling Left: Supervision/Verbal cueing Supine to Sit: Contact Guard/Touching assist Sit to Supine: Contact Guard/Touching assist Transfers Transfers: Sit to Stand;Stand to Sit;Stand Pivot Transfers Sit to Stand: Contact Guard/Touching assist Stand to Sit: Contact Guard/Touching assist Stand Pivot Transfers: Contact Guard/Touching assist Transfer (Assistive device): 1 person hand held assist Locomotion  Gait Ambulation: Yes Gait Assistance: Contact Guard/Touching assist Gait Distance (Feet): 150 Feet Assistive device: 1 person hand held assist Gait Gait: Yes Gait Pattern:  Impaired Gait Pattern: Decreased step length - left;Decreased stance time - left;Decreased hip/knee flexion - left;Decreased dorsiflexion - left;Decreased weight shift to left Stairs / Additional Locomotion Stairs: Yes Stairs Assistance: Contact Guard/Touching assist;Minimal Assistance - Patient > 75% Stair Management Technique: Two rails;Forwards Number of Stairs: 12 Height of Stairs: 6 Wheelchair Mobility Wheelchair Mobility: No  Skilled Intervention: PT Evaluation completed; see above for results. PT educated patient in roles of PT vs OT, PT POC, rehab potential, rehab goals, and discharge recommendations along with recommendation for follow-up rehabilitation services. Individual treatment initiated:  Patient supine in bed upon PT arrival. Patient alert and agreeable to PT session.   No pain complaint during session. Is aphasic but intermittently demos less receptive aphasia than expressive.  Therapeutic Activity: Bed Mobility: Patient performed supine <> sit with ***. Provided vc/ tc for ***. Transfers: Patient performed sit <> stand transfers throughout session with ***. Provided vc/ tc for***.  Gait Training:  Patient ambulated *** ft using *** with ***. Demonstrated with ***. Provided vc/tc for ***.  Wheelchair Mobility:  Patient propelled wheelchair *** feet with ***.  Provided vc/tc for ***  Neuromuscular Re-ed: NMR facilitated during session with focus on ***. Pt guided in ***. NMR performed for improvements in motor control and coordination, balance, sequencing, judgement, and self confidence/ efficacy in performing all aspects of mobility at highest level of independence.   Therapeutic Exercise: Patient performed the following exercises with verbal and tactile cues for proper technique. ***  Patient ***  at end of session with brakes locked, *** alarm set, and all needs within reach.    Discharge Criteria: Patient will be discharged from PT if patient refuses treatment 3 consecutive times without medical reason, if treatment goals not met, if there is a change in medical status, if patient makes no progress towards goals or if patient is discharged from hospital.  The above assessment, treatment plan, treatment alternatives and goals were discussed and mutually agreed upon: {Assessment/Treatment Plan Discussed/Agreed:3049017}  Mliss DELENA Milliner 12/25/2023, 3:48 PM

## 2023-12-25 NOTE — Plan of Care (Signed)
  Problem: RH Comprehension Communication Goal: LTG Patient will comprehend basic/complex auditory (SLP) Description: LTG: Patient will comprehend basic/complex auditory information with cues (SLP). Flowsheets (Taken 12/25/2023 2033) LTG: Patient will comprehend: Basic auditory information LTG: Patient will comprehend auditory information with cueing (SLP): Moderate Assistance - Patient 50 - 74%   Problem: RH Expression Communication Goal: LTG Patient will express needs/wants via multi-modal(SLP) Description: LTG:  Patient will express needs/wants via multi-modal communication (gestures/written, etc) with cues (SLP) Flowsheets (Taken 12/25/2023 2033) LTG: Patient will express needs/wants via multimodal communication (gestures/written, etc) with cueing (SLP): Maximal Assistance - Patient 25 - 49% Goal: LTG Patient will increase word finding of common (SLP) Description: LTG:  Patient will increase word finding of common objects/daily info/abstract thoughts with cues using compensatory strategies (SLP). Flowsheets (Taken 12/25/2023 2033) LTG: Patient will increase word finding of common (SLP): Maximal Assistance - Patient 25 - 49% Patient will use compensatory strategies to increase word finding of: Common objects   Problem: RH Awareness Goal: LTG: Patient will demonstrate awareness during functional activites type of (SLP) Description: LTG: Patient will demonstrate awareness during functional activites type of (SLP) Flowsheets (Taken 12/25/2023 2033) Patient will demonstrate during cognitive/linguistic activities awareness type of: Intellectual LTG: Patient will demonstrate awareness during cognitive/linguistic activities with assistance of (SLP): Maximal Assistance - Patient 25 - 49%

## 2023-12-25 NOTE — Progress Notes (Signed)
 NT heard bed alarmed sound and went to check on the patient. When NT Emily walked into room, Alan Wise stated I saw the patient on the floor in between the bed and nightstand lying on his left side of his body facing direction of the door position immediately called for help''. The staff (NT) and RN walked into room patient raised his head to acknowledge staff was present. He was assessed, he was able to respond to'' yes'' and ''no'' questions. He denied pain but respond to ''yes when asked if he hit his head?  He was able to lift right arm and move his lower extremities, Bed linen changed and assisted back into bed. Call place within reach. MD notified see new orders, placed call to Spouse Alan Wise.

## 2023-12-25 NOTE — Discharge Instructions (Addendum)
 Inpatient Rehab Discharge Instructions  Alan Wise Discharge date and time:  01/11/24  Activities/Precautions/ Functional Status: Activity: no lifting, driving, or strenuous exercise till cleared by MD Diet: Mechanical soft thin liquids  Wound Care: none needed   Functional status:  ___ No restrictions      ___ Walk up steps independently _X__ 24/7 supervision/assistance   ___ Walk up steps with assistance ___ Intermittent supervision/assistance  ___ Bathe/dress independently ___ Walk with walker     ___ Bathe/dress with assistance ___ Walk Independently     ___ Shower independently ___ Walk with assistance     _X__ Shower with assistance _X__ No alcohol      ___ Return to work/school ________  Special Instructions:  COMMUNITY REFERRALS UPON DISCHARGE:    Home Health:   PT      OT      ST      SNA                     Agency: [facility]         Phone:  [phone] *Please expect follow-up within 2-3 business days for discharge to schedule your home visit. If you have not received follow-up, be sure to contact the site directly.*    Medical Equipment/Items Ordered: rolling walker and transport chair                                                 Agency/Supplier:[facility] [phone]    My questions have been answered and I understand these instructions. I will adhere to these goals and the provided educational materials after my discharge from the hospital.  Patient/Caregiver Signature _______________________________ Date __________  Clinician Signature _______________________________________ Date __________  Please bring this form and your medication list with you to all your follow-up doctor's appointments. Information on my medicine - ELIQUIS  (apixaban )  Why was Eliquis  prescribed for you? Eliquis  was prescribed for you to reduce the risk of forming blood clots that can cause a stroke if you have a medical condition called atrial  fibrillation (a type of irregular heartbeat) OR to reduce the risk of a blood clots forming after orthopedic surgery.  What do You need to know about Eliquis  ? Take your Eliquis  TWICE DAILY - one tablet in the morning and one tablet in the evening with or without food.  It would be best to take the doses about the same time each day.  If you have difficulty swallowing the tablet whole please discuss with your pharmacist how to take the medication safely.  Take Eliquis  exactly as prescribed by your doctor and DO NOT stop taking Eliquis  without talking to the doctor who prescribed the medication.  Stopping may increase your risk of developing a new clot or stroke.  Refill your prescription before you run out.  After discharge, you should have regular check-up appointments with your healthcare provider that is prescribing your Eliquis .  In the future your dose may need to be changed if your kidney function or weight changes by a significant amount or as you get older.  What do you do if you miss a dose? If you miss a dose, take it as soon as you remember on the same day and resume taking twice daily.  Do not take more than one dose of ELIQUIS  at the same time.  Important Safety Information A possible side effect of Eliquis  is bleeding. You should call your healthcare provider right away if you experience any of the following: Bleeding from an injury or your nose that does not stop. Unusual colored urine (red or dark brown) or unusual colored stools (red or black). Unusual bruising for unknown reasons. A serious fall or if you hit your head (even if there is no bleeding).  Some medicines may interact with Eliquis  and might increase your risk of bleeding or clotting while on Eliquis . To help avoid this, consult your healthcare provider or pharmacist prior to using any new prescription or non-prescription medications, including herbals, vitamins, non-steroidal anti-inflammatory drugs (NSAIDs)  and supplements.  This website has more information on Eliquis  (apixaban ): [email or web address] .[email or web address] dena

## 2023-12-25 NOTE — Plan of Care (Signed)
  Problem: RH Balance Goal: LTG Patient will maintain dynamic standing with ADLs (OT) Description: LTG:  Patient will maintain dynamic standing balance with assist during activities of daily living (OT)  Flowsheets (Taken 12/25/2023 1258) LTG: Pt will maintain dynamic standing balance during ADLs with: Supervision/Verbal cueing   Problem: RH Grooming Goal: LTG Patient will perform grooming w/assist,cues/equip (OT) Description: LTG: Patient will perform grooming with assist, with/without cues using equipment (OT) Flowsheets (Taken 12/25/2023 1258) LTG: Pt will perform grooming with assistance level of: Supervision/Verbal cueing   Problem: RH Bathing Goal: LTG Patient will bathe all body parts with assist levels (OT) Description: LTG: Patient will bathe all body parts with assist levels (OT) Flowsheets (Taken 12/25/2023 1258) LTG: Pt will perform bathing with assistance level/cueing: Supervision/Verbal cueing   Problem: RH Dressing Goal: LTG Patient will perform upper body dressing (OT) Description: LTG Patient will perform upper body dressing with assist, with/without cues (OT). Flowsheets (Taken 12/25/2023 1258) LTG: Pt will perform upper body dressing with assistance level of: Supervision/Verbal cueing Goal: LTG Patient will perform lower body dressing w/assist (OT) Description: LTG: Patient will perform lower body dressing with assist, with/without cues in positioning using equipment (OT) Flowsheets (Taken 12/25/2023 1258) LTG: Pt will perform lower body dressing with assistance level of: Supervision/Verbal cueing   Problem: RH Toileting Goal: LTG Patient will perform toileting task (3/3 steps) with assistance level (OT) Description: LTG: Patient will perform toileting task (3/3 steps) with assistance level (OT)  Flowsheets (Taken 12/25/2023 1258) LTG: Pt will perform toileting task (3/3 steps) with assistance level: Supervision/Verbal cueing   Problem: RH Functional Use of Upper  Extremity Goal: LTG Patient will use RT/LT upper extremity as a (OT) Description: LTG: Patient will use right/left upper extremity as a stabilizer/gross assist/diminished/nondominant/dominant level with assist, with/without cues during functional activity (OT) Flowsheets (Taken 12/25/2023 1258) LTG: Use of upper extremity in functional activities: LUE as diminished level LTG: Pt will use upper extremity in functional activity with assistance level of: Supervision/Verbal cueing   Problem: RH Toilet Transfers Goal: LTG Patient will perform toilet transfers w/assist (OT) Description: LTG: Patient will perform toilet transfers with assist, with/without cues using equipment (OT) Flowsheets (Taken 12/25/2023 1258) LTG: Pt will perform toilet transfers with assistance level of: Supervision/Verbal cueing   Problem: RH Tub/Shower Transfers Goal: LTG Patient will perform tub/shower transfers w/assist (OT) Description: LTG: Patient will perform tub/shower transfers with assist, with/without cues using equipment (OT) Flowsheets (Taken 12/25/2023 1258) LTG: Pt will perform tub/shower stall transfers with assistance level of: Supervision/Verbal cueing

## 2023-12-25 NOTE — Progress Notes (Signed)
 PROGRESS NOTE   Subjective/Complaints:   Pt reports  no nausea-  pt fell last night- per NT,  pt vomited all over bed and was found on floor. Also per NT, pt refused to get up to bathroom when was scheduled to at 11 pm and 3am.   Pt denies any pain and is emphatic doesn't have any nausea- not able ot discern due to pt's aphasia if was nauseated last night.   ROS: .limited due to aphasia  Objective:   CT HEAD WO CONTRAST ( ) Result Date: 12/25/2023 CLINICAL DATA:  Head trauma, minor, normal mental status (Age 51-64y) S/ FALL. EXAM: CT HEAD WITHOUT CONTRAST TECHNIQUE: Contiguous axial images were obtained from the base of the skull through the vertex without intravenous contrast. RADIATION DOSE REDUCTION: This exam was performed according to the departmental dose-optimization program which includes automated exposure control, adjustment of the mA and/or kV according to patient size and/or use of iterative reconstruction technique. COMPARISON:  CT head 12/22/2023.  MRI head 12/23/2023. FINDINGS: Brain: Similar oval vein right MCA and ACA territory infarcts without acute hemorrhage or progressive mass effect. Additional scattered white matter hypodensities, compatible with chronic microvascular ischemic disease. No hydrocephalus. Cerebral atrophy. Similar dystrophic calcifications in the right frontal and periatrial region. Vascular: No hyperdense vessel identified. Skull: No acute fracture. Sinuses/Orbits: Paranasal sinus mucosal thickening. No acute orbital findings. IMPRESSION: 1. Similar right ACA and MCA territory infarcts without progressive mass effect or acute hemorrhage. MRI could provide more sensitive evaluation for new/interval acute infarct if clinically warranted. 2. Similar appearance of treated tumor with dystrophic calcifications. Electronically Signed   By: Gilmore GORMAN Molt M.D.   On: 12/25/2023 00:19   VAS US  CAROTID Result  Date: 12/24/2023 Carotid Arterial Duplex Study Patient Name:  Alan Wise  Date of Exam:   12/24/2023 Medical Rec #: [id number]          Accession #:    [id number] Date of Birth: 1971-05-13         Patient Gender: M Patient Age:   53 years Exam Location:  [facility] Procedure:      VAS US  CAROTID Referring Phys: DEVON SHAFER --------------------------------------------------------------------------------  Indications:       CVA. Risk Factors:      Hyperlipidemia, Diabetes. Other Factors:     GERD. Comparison Study:  No priors. Performing Technologist: Ricka Sturdivant-Jones RDMS, RVT  Examination Guidelines: A complete evaluation includes B-mode imaging, spectral Doppler, color Doppler, and power Doppler as needed of all accessible portions of each vessel. Bilateral testing is considered an integral part of a complete examination. Limited examinations for reoccurring indications may be performed as noted.  Right Carotid Findings: +----------+--------+--------+--------+------------------+------------------+           PSV cm/sEDV cm/sStenosisPlaque DescriptionComments           +----------+--------+--------+--------+------------------+------------------+ CCA Prox  104     16                                                   +----------+--------+--------+--------+------------------+------------------+ CCA  Distal89      18                                intimal thickening +----------+--------+--------+--------+------------------+------------------+ ICA Prox  74      20      1-39%   homogeneous                          +----------+--------+--------+--------+------------------+------------------+ ICA Mid   66      18                                                   +----------+--------+--------+--------+------------------+------------------+ ICA Distal60      22                                                    +----------+--------+--------+--------+------------------+------------------+ ECA       118     11                                                   +----------+--------+--------+--------+------------------+------------------+ +----------+--------+-------+----------------+-------------------+           PSV cm/sEDV cmsDescribe        Arm Pressure (mmHG) +----------+--------+-------+----------------+-------------------+ [id number]            Multiphasic, WNL                    +----------+--------+-------+----------------+-------------------+ +---------+--------+--+--------+--+---------+ VertebralPSV cm/s39EDV cm/s13Antegrade +---------+--------+--+--------+--+---------+  Left Carotid Findings: +----------+--------+--------+--------+------------------+--------+           PSV cm/sEDV cm/sStenosisPlaque DescriptionComments +----------+--------+--------+--------+------------------+--------+ CCA Prox  111     15                                         +----------+--------+--------+--------+------------------+--------+ CCA Distal109     19                                         +----------+--------+--------+--------+------------------+--------+ ICA Prox  56      13      1-39%   calcific                   +----------+--------+--------+--------+------------------+--------+ ICA Mid   57      19                                         +----------+--------+--------+--------+------------------+--------+ ICA Distal56      21                                         +----------+--------+--------+--------+------------------+--------+ ECA  78      8                                          +----------+--------+--------+--------+------------------+--------+ +----------+--------+--------+----------------+-------------------+           PSV cm/sEDV cm/sDescribe        Arm Pressure (mmHG)  +----------+--------+--------+----------------+-------------------+ [id number]             Multiphasic, WNL                    +----------+--------+--------+----------------+-------------------+ +---------+--------+--+--------+--+---------+ VertebralPSV cm/s42EDV cm/s10Antegrade +---------+--------+--+--------+--+---------+   Summary: Right Carotid: Velocities in the right ICA are consistent with a 1-39% stenosis. Left Carotid: Velocities in the left ICA are consistent with a 1-39% stenosis. Vertebrals:  Bilateral vertebral arteries demonstrate antegrade flow. Subclavians: Normal flow hemodynamics were seen in bilateral subclavian              arteries. *See table(s) above for measurements and observations.     Preliminary    DG Swallowing Func-Speech Pathology Result Date: 12/24/2023 Table formatting from the original result was not included. Modified Barium Swallow Study Patient Details Name: Alan Wise MRN: [id number] Date of Birth: [birth year] Today's Date: 12/24/2023 HPI/PMH: HPI: 53 y.o. male presents to [facility] 12/18/23 after falling two days prior with residual L sided weakness and somnolence. Brain MRI showed new patchy restricted diffusion in R basal ganglia, new punctuate focus of restricted diffusion in R caudate head. Recent admit 6/17-6/19 with anterior R temporal lobe punctate restricted diffusion. PMHx: T2DM, GERD, CVA w/ L sided weakness, oligodendroglioma Clinical Impression: Pt presents with a mild pharyngeal dysphagia and a primary esophageal dysphagia that is likely contributing to pharyngeal deficits. Recommend follow up with GI to determine if pt is a candidate for any intervention to aid esophageal clearance of solids and liquids. Oral phase judged to be WNL. Pharyngeal deficits characterized by reduced base of tongue retraction, occasional mistiming of epiglottic inversion (vs incomplete inversion) and laryngeal vestibule closure, which contributed to aspiration events.  Reduced distention of the UES was the greatest pharyngeal concern. Concerns for esophageal dysphagia included moderate retention of pudding and solid in upper esophagus and backflow of thin liquids (by straw) through the UES. Retention of solids likely contributed to aspiration event of thin liquid wash that followed solid trials. During esophageal sweep, retention of liquids observed in the lower esophagus with some retrograde flow.  This residue did eventually pass to the stomach. Findings: -All aspiration events were audible and minimal. -There was audible aspiration of nectar-thick liquids x1 by cup during the swallow, suspect due to mistiming of epiglottic inversion and laryngeal vestibule closure. -There was audible aspiration of thin liquid wash that followed solid trial. -There was pharyngeal residue primarily in the UES post swallow. Residue amount increased with increased viscosity. -special note: pt does have calcification of portions of the airway and suspected in portion of artery around the anterior upper esophagus. This is mentioned because calcified areas move with swallow initiation and it briefly looks like aspiration. Detailed diet recommendations and aspiration precautions outlined below. Despite aspiration of thin liquids and ongoing risk of aspiration, recommend continue this diet given pt has not exhibited concerns for pneumonia and pt is at a risk of aspirated nectar-thick liquids. Research shows the aspiration of thickened liquids can be more detrimental to the lungs; therefore, we will continue to monitor his tolerance of thin  liquids closely. SLP will continue to follow for language and swallowing intervention. Factors that may increase risk of adverse event in presence of aspiration [name] & [name] 2021): No data recorded Recommendations/Plan: Swallowing Evaluation Recommendations Swallowing Evaluation Recommendations Recommendations: PO diet PO Diet Recommendation: Thin liquids (Level  0); Dysphagia 3 (Mechanical soft) Liquid Administration via: Cup; No straw Medication Administration: Whole meds with puree (consider crushing if he is having pill dysphagia) Supervision: Patient able to self-feed; Intermittent supervision/cueing for swallowing strategies Swallowing strategies  : Slow rate; Small bites/sips; Follow solids with liquids Postural changes: Position pt fully upright for meals; Stay upright 30-60 min after meals Oral care recommendations: Oral care BID (2x/day) Recommended consults: Consider GI consultation Treatment Plan Treatment Plan Treatment recommendations: Therapy as outlined in treatment plan below Follow-up recommendations: Acute inpatient rehab (3 hours/day) Functional status assessment: Patient has had a recent decline in their functional status and demonstrates the ability to make significant improvements in function in a reasonable and predictable amount of time. Treatment frequency: Min 2x/week Treatment duration: 4 weeks Interventions: Aspiration precaution training; Diet toleration management by SLP; Compensatory techniques; Patient/family education; Respiratory muscle strength training Recommendations Recommendations for follow up therapy are one component of a multi-disciplinary discharge planning process, led by the attending physician.  Recommendations may be updated based on patient status, additional functional criteria and insurance authorization. Assessment: Orofacial Exam: Orofacial Exam Oral Cavity: Oral Hygiene: WFL Oral Cavity - Dentition: Adequate natural dentition Orofacial Anatomy: WFL Oral Motor/Sensory Function: WFL Anatomy: Anatomy: WFL Boluses Administered: Boluses Administered Boluses Administered: Thin liquids (Level 0); Mildly thick liquids (Level 2, nectar thick); Puree; Solid  Oral Impairment Domain: Oral Impairment Domain Lip Closure: No labial escape Tongue control during bolus hold: Cohesive bolus between tongue to palatal seal Bolus  preparation/mastication: Timely and efficient chewing and mashing Bolus transport/lingual motion: Brisk tongue motion Oral residue: Trace residue lining oral structures Location of oral residue : Tongue Initiation of pharyngeal swallow : Posterior angle of the ramus  Pharyngeal Impairment Domain: Pharyngeal Impairment Domain Soft palate elevation: No bolus between soft palate (SP)/pharyngeal wall (PW) Laryngeal elevation: Complete superior movement of thyroid  cartilage with complete approximation of arytenoids to epiglottic petiole Anterior hyoid excursion: Complete anterior movement Epiglottic movement: Partial inversion (varied; inconsistent) Laryngeal vestibule closure: Complete, no air/contrast in laryngeal vestibule Pharyngeal stripping wave : Present - diminished Pharyngeal contraction (A/P view only): Incomplete (pseudoviderticulae) Pharyngoesophageal segment opening: Partial distention/partial duration, partial obstruction of flow Tongue base retraction: Trace column of contrast or air between tongue base and PPW Pharyngeal residue: Collection of residue within or on pharyngeal structures Location of pharyngeal residue: Pyriform sinuses; Pharyngeal wall  Esophageal Impairment Domain: Esophageal Impairment Domain Esophageal clearance upright position: Esophageal retention with retrograde flow through the PES Pill: No data recorded Penetration/Aspiration Scale Score: Penetration/Aspiration Scale Score 1.  Material does not enter airway: Thin liquids (Level 0); Mildly thick liquids (Level 2, nectar thick); Puree; Solid 7.  Material enters airway, passes BELOW cords and not ejected out despite cough attempt by patient: Thin liquids (Level 0); Mildly thick liquids (Level 2, nectar thick) Compensatory Strategies: Compensatory Strategies Compensatory strategies: Yes Liquid wash: -- (partially effective for reducing residue in upper esophagus)   General Information: Caregiver present: No (follow up completed at  bedside post study)  Diet Prior to this Study: Dysphagia 3 (mechanical soft); Thin liquids (Level 0)   Temperature : Normal   Respiratory Status: WFL   Supplemental O2: None (Room air)   History of Recent Intubation: No  Behavior/Cognition: Alert; Cooperative Self-Feeding Abilities: Able to self-feed Baseline vocal quality/speech: Normal No data recorded Volitional Swallow: Able to elicit Exam Limitations: No limitations Goal Planning: Prognosis for improved oropharyngeal function: Good Barriers to Reach Goals: Language deficits No data recorded Patient/Family Stated Goal: improve communication; hopeful to get back to his baseline Consulted and agree with results and recommendations: Patient; Family member/caregiver; Physician Pain: Pain Assessment Pain Assessment: No/denies pain Faces Pain Scale: 2 Pain Location: L shoulder with mobility Pain Descriptors / Indicators: Discomfort; Aching Pain Intervention(s): Limited activity within patient's tolerance; Monitored during session; Other (comment) (ROM for stiffness) End of Session: Start Time:SLP Start Time (ACUTE ONLY): 0830 Stop Time: SLP Stop Time (ACUTE ONLY): 9141 Time Calculation:SLP Time Calculation (min) (ACUTE ONLY): 28 min Charges: SLP Evaluations $ SLP Speech Visit: 1 Visit SLP Evaluations $MBS Swallow: 1 Procedure $Speech Treatment for Individual: 1 Procedure SLP visit diagnosis: SLP Visit Diagnosis: Aphasia (R47.01); Cognitive communication deficit (R41.841); Dysphagia, pharyngoesophageal phase (R13.14) Past Medical History: Past Medical History: Diagnosis Date  Allergy   Complication of anesthesia   pt reports he is starting to have more difficulty arousing after surgery  Diabetes mellitus (HCC)   GERD (gastroesophageal reflux disease)   Headache   History of kidney stones   Hyperlipidemia   Renal disorder   kidney stones Past Surgical History: Past Surgical History: Procedure Laterality Date  APPLICATION OF CRANIAL NAVIGATION Right 01/17/2021  Procedure:  APPLICATION OF CRANIAL NAVIGATION;  Surgeon: Debby Dorn MATSU, MD;  Location: [facility] OR;  Service: Neurosurgery;  Laterality: Right;  COLONOSCOPY  09/03/1994  COLONOSCOPY WITH PROPOFOL  N/A 01/07/2023  Procedure: COLONOSCOPY WITH PROPOFOL ;  Surgeon: Jinny Carmine, MD;  Location: [facility] ENDOSCOPY;  Service: Endoscopy;  Laterality: N/A;  NOT TOO EARLY  CYSTOSCOPY WITH STENT PLACEMENT Right 01/25/2016  Procedure: CYSTOSCOPY WITH STENT PLACEMENT;  Surgeon: Redell Lynwood Napoleon, MD;  Location: [facility] ORS;  Service: Urology;  Laterality: Right;  FRAMELESS  BIOPSY WITH BRAINLAB Right 01/17/2021  Procedure: RIGHT STEREOTACTIC BIOPSY OF INSULAR LESION;  Surgeon: Debby Dorn MATSU, MD;  Location: [facility] OR;  Service: Neurosurgery;  Laterality: Right;  KNEE ARTHROSCOPY W/ MENISCAL REPAIR Right 06/23/1988  POLYPECTOMY  01/07/2023  Procedure: POLYPECTOMY;  Surgeon: Jinny Carmine, MD;  Location: [facility] ENDOSCOPY;  Service: Endoscopy;;  removal of birthmark  06/08/2013  SKIN CANCER EXCISION  06/23/2012  TYMPANOSTOMY TUBE PLACEMENT  08/06/1978  URETEROSCOPY WITH HOLMIUM LASER LITHOTRIPSY Right 01/25/2016  Procedure: URETEROSCOPY WITH HOLMIUM LASER LITHOTRIPSY;  Surgeon: Redell Lynwood Napoleon, MD;  Location: [facility] ORS;  Service: Urology;  Laterality: Right;  URETHRAL STRICTURE DILATATION    visual inspection of vocal cord    1973  WISDOM TOOTH EXTRACTION   [name] 12/24/2023, 12:12 PM  MR BRAIN WO CONTRAST Result Date: 12/23/2023 CLINICAL DATA:  53 year old male with punctate right temporal lobe infarct last month. Neurologic deficit, right basal ganglia infarct on 12/18/2023. EXAM: MRI HEAD WITHOUT CONTRAST TECHNIQUE: Multiplanar, multiecho pulse sequences of the brain and surrounding structures were obtained without intravenous contrast. COMPARISON:  Brain MRI 12/18/2023 and earlier. FINDINGS: Brain: Progressive restricted diffusion now beginning anterior to the right caudate and continuing through the nearby right ACA territory, confluent  involvement along a 4-5 cm segment of the right cingulate (series 3, image 40). Unresolved diffusion restriction previously seen along the right frontal horn, and also in the posterior right corona radiata tracking toward the right lentiform, and also lateral to the atrium of the right lateral ventricle. Also punctate new restricted diffusion in the left lentiform series  3, image 30. T2 and FLAIR hyperintense cytotoxic edema in the affected areas. Petechial hemorrhage on T2* imaging not significantly changed from previous SWI Heidelberg classification 1b: HI2, confluent petechiae, no mass effect. Pre-existing cystic encephalomalacia and hemosiderin along the anterior right MCA division also. Stable T2 and FLAIR hyperintense probable gliosis of the right insula there. Stable encephalomalacia with T2 shine through along the anterior the left genu of the corpus callosum. Chronic brainstem Wallerian degeneration, greater on the right. No midline shift, mass effect, evidence of mass lesion, acute ventriculomegaly, extra-axial collection. Cervicomedullary junction and pituitary are within normal limits. Vascular: Major intracranial vascular flow voids are stable. Skull and upper cervical spine: Negative. Visualized bone marrow signal is within normal limits. Sinuses/Orbits: Stable paranasal sinus mucosal thickening and opacification. Negative orbits. Other: Mastoids well aerated. IMPRESSION: 1. New acute and confluent mid and distal Right ACA territory infarction. Punctate new left basal ganglia acute lacunar infarct. And ongoing multifocal right MCA deep white and deep gray matter ischemia otherwise not significantly changed from 12/18/2023. 2. Associated fatigue no hemorrhage. No malignant hemorrhagic transformation. No intracranial mass effect or midline shift. 3. Underlying chronic ischemic disease and encephalomalacia. Electronically Signed   By: VEAR Hurst M.D.   On: 12/23/2023 13:02   Recent Labs    12/25/23 0441   WBC 8.8  HGB 15.7  HCT 45.9  PLT 216   Recent Labs    12/23/23 0448 12/25/23 0441  NA 137 138  K 4.1 3.8  CL 102 104  CO2 26 26  GLUCOSE 117* 97  BUN 11 16  CREATININE 0.87 0.93  CALCIUM  9.4 9.2    Intake/Output Summary (Last 24 hours) at 12/25/2023 1009 Last data filed at 12/25/2023 0700 Gross per 24 hour  Intake 360 ml  Output --  Net 360 ml        Physical Exam: Vital Signs Blood pressure 121/79, pulse 73, temperature 98.2 F (36.8 C), temperature source Oral, resp. rate 16, height 5' 8.5 (1.74 m), weight 59.6 kg, SpO2 93%.    General: awake, alert, calm; NAD HENT: conjugate gaze; oropharynx a little dry- L facial droop and visual field deficit CV: regular rate and rhythm; no JVD Pulmonary: CTA B/L; no W/R/R- good air movement GI: soft, NT, ND, (+)BS Psychiatric: calm-  Neurological: aphasic severe- can answer yes/no Musculoskeletal:        General: Normal range of motion.     Cervical back: Neck supple. No tenderness.     Comments: RUE-  5-/5 throughout- grip is 5/5 - but couldn't get pt to let go in spite of hurting me LUE- biceps 4/5; triceps 4+/5; WE 4-/5; grip 4/5; FA 2+/5 RLE- 5-/5 throughout LLE- 4/5 throughout Holds LUE in posturing/elbow flexed  Skin:    General: Skin is warm and dry.     Comments: R AC fossa IV- looks OK, but needs to come out in next 24-48 hours  Neurological:     Mental Status: He is alert.     Comments: Left facial weakness with mild to moderate dysarthria. Left sided weakness with flexor tone and sensory deficits affecting face, LUE/LLE. Expressive/receptive deficits with frustration when corrected. Extrmely frustrated, with significant perseveration, L inattention/neglect Decreased to light touch in L side throughout Severe expressive aphasia- a lot of repetition of and/the/etc- however also has mild to moderate receptive aphasia as well Increased tone mainly in LUE- elbow held in flexion at rest -MAS of 2 at elbow and  shoulder- and MAS 1+ at wrist/fingers  Assessment/Plan: 1. Functional deficits which require 3+ hours per day of interdisciplinary therapy in a comprehensive inpatient rehab setting. Physiatrist is providing close team supervision and 24 hour management of active medical problems listed below. Physiatrist and rehab team continue to assess barriers to discharge/monitor patient progress toward functional and medical goals  Care Tool:  Bathing    Body parts bathed by patient: Right arm, Left arm, Chest, Abdomen, Front perineal area, Right upper leg, Left upper leg, Right lower leg, Left lower leg, Face   Body parts bathed by helper: Buttocks, Right arm     Bathing assist Assist Level: Maximal Assistance - Patient 24 - 49%     Upper Body Dressing/Undressing Upper body dressing   What is the patient wearing?: Pull over shirt    Upper body assist Assist Level: Minimal Assistance - Patient > 75%    Lower Body Dressing/Undressing Lower body dressing      What is the patient wearing?: Underwear/pull up, Pants     Lower body assist Assist for lower body dressing: Minimal Assistance - Patient > 75%     Toileting Toileting    Toileting assist Assist for toileting: Moderate Assistance - Patient 50 - 74%     Transfers Chair/bed transfer  Transfers assist     Chair/bed transfer assist level: Minimal Assistance - Patient > 75%     Locomotion Ambulation   Ambulation assist              Walk 10 feet activity   Assist           Walk 50 feet activity   Assist           Walk 150 feet activity   Assist           Walk 10 feet on uneven surface  activity   Assist           Wheelchair     Assist               Wheelchair 50 feet with 2 turns activity    Assist            Wheelchair 150 feet activity     Assist          Blood pressure 121/79, pulse 73, temperature 98.2 F (36.8 C), temperature source Oral,  resp. rate 16, height 5' 8.5 (1.74 m), weight 59.6 kg, SpO2 93%.  Medical Problem List and Plan: 1. Functional deficits secondary to multiple infarcts, the worst R ACA infarct- with L hemiparesis             -patient may  shower             -ELOS/Goals: 7-10 days supervision for PT and OT_ min-mod A for SLP             Admit to CIR  First day of therapy/evaluations- Con't CIR PT, OT and SLP- PT high level., but needs OT and SLP significantly  -fell last night- head CT looks the same 2.  Antithrombotics: -DVT/anticoagulation:  Pharmaceutical: Eliquis - cannot stop             -antiplatelet therapy: N/A 3. Pain Management: Routine pressure relief measures.  4. Mood/Behavior/Sleep: LCSW to follow for evaluation and support.              -antipsychotic agents: N/A 5. Neuropsych/cognition: This patient is not fully capable of making decisions on his own behalf. 6. Skin/Wound Care: Routine pressure relief measure.  7. Fluids/Electrolytes/Nutrition:  monitor I/O. Check CMET in am 8. T2DM: Hgb A1c-5.4 and diet controlled.  9.  HTN: Monitor BP TID--avoid hypoperfusion. Continue Amlodipine  5 mg/day and inderal  20 mg bid. 7/4- BP is well controlled  10. Dysphagia: NO straws to decrease aspiration events.  --GI consult recommended as retention of solids contribute to aspiration. Per Dr. Lizabeth note this was discussed with GI and who felt patient to be too high risk for GI procedures and recommended follow up after d/c.  11. Seizure d/o; On Lamictal  150 mg am and 200 mg pm-->educate on compliance.  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  13. Dyslipidemia: LDL-129 and HDL 27. On Lipitor 40 mg/day. .  14. Frequency/Nocturia: Gets up at nights and pt/wife agreed to toileting at 11 pm and 3 am to prevent attempts out of bed.   7/4- pt refused these scheduled get up times, but fell- asked nursing to try and get him up for nocturia 15. Spasticity -  concerned about starting Any spasticity meds due to risk of sedation/confusion - suggest to be assessed for Botulinum toxin for LUE.  16. L inattention/neglect and perseveration- will d/w primary CIR physician- Dr Emeline.     I spent a total of 37   minutes on total care today- >50% coordination of care- due to  D/w PA and nursing at length due to fall overnight- advised pt and reminded him to take meds- to prevent another stroke.         LOS: 1 days A FACE TO FACE EVALUATION WAS PERFORMED  Tearsa Kowalewski 12/25/2023, 10:09 AM

## 2023-12-26 DIAGNOSIS — I10 Essential (primary) hypertension: Secondary | ICD-10-CM | POA: Diagnosis not present

## 2023-12-26 DIAGNOSIS — I63521 Cerebral infarction due to unspecified occlusion or stenosis of right anterior cerebral artery: Secondary | ICD-10-CM | POA: Diagnosis not present

## 2023-12-26 LAB — GLUCOSE, CAPILLARY: Glucose-Capillary: 119 mg/dL — ABNORMAL HIGH (ref 70–99)

## 2023-12-26 NOTE — Plan of Care (Signed)

## 2023-12-26 NOTE — Plan of Care (Signed)
  Problem: Sit to Stand Goal: LTG:  Patient will perform sit to stand with assistance level (PT) Description: LTG:  Patient will perform sit to stand with assistance level (PT) Flowsheets (Taken 12/25/2023 1409) LTG: PT will perform sit to stand in preparation for functional mobility with assistance level: Independent with assistive device   Problem: RH Bed Mobility Goal: LTG Patient will perform bed mobility with assist (PT) Description: LTG: Patient will perform bed mobility with assistance, with/without cues (PT). Flowsheets (Taken 12/25/2023 1409) LTG: Pt will perform bed mobility with assistance level of: Independent   Problem: RH Bed to Chair Transfers Goal: LTG Patient will perform bed/chair transfers w/assist (PT) Description: LTG: Patient will perform bed to chair transfers with assistance (PT). Flowsheets (Taken 12/25/2023 1409) LTG: Pt will perform Bed to Chair Transfers with assistance level: Independent with assistive device    Problem: RH Car Transfers Goal: LTG Patient will perform car transfers with assist (PT) Description: LTG: Patient will perform car transfers with assistance (PT). Flowsheets (Taken 12/25/2023 1409) LTG: Pt will perform car transfers with assist:: Supervision/Verbal cueing   Problem: RH Ambulation Goal: LTG Patient will ambulate in home environment (PT) Description: LTG: Patient will ambulate in home environment, # of feet with assistance (PT). Flowsheets (Taken 12/25/2023 1409) LTG: Pt will ambulate in home environ  assist needed:: Supervision/Verbal cueing LTG: Ambulation distance in home environment: up to 50 ft per bout Goal: LTG Patient will ambulate in community environment (PT) Description: LTG: Patient will ambulate in community environment, # of feet with assistance (PT). Flowsheets (Taken 12/25/2023 1409) LTG: Pt will ambulate in community environ  assist needed:: Supervision/Verbal cueing LTG: Ambulation distance in community environment: >400 ft  per bout   Problem: RH Stairs Goal: LTG Patient will ambulate up and down stairs w/assist (PT) Description: LTG: Patient will ambulate up and down # of stairs with assistance (PT) 12/26/2023 0446 by Thaddeus Mliss LABOR, PT Flowsheets (Taken 12/25/2023 1406) LTG: Pt will ambulate up/down stairs assist needed:: Supervision/Verbal cueing LTG: Pt will  ambulate up and down number of stairs: at least 5+10 steps using HR setup as per home environment 12/26/2023 0409 by Thaddeus Mliss LABOR, PT Flowsheets (Taken 12/25/2023 1409) LTG: Pt will ambulate up/down stairs assist needed:: Supervision/Verbal cueing

## 2023-12-26 NOTE — Progress Notes (Signed)
 Physical Therapy Session Note  Patient Details  Name: Alan Wise MRN: [id number] Date of Birth: 1970-07-09  Today's Date: 12/26/2023 PT Individual Time: 0802-0915 PT Individual Time Calculation (min): 73 min   Short Term Goals: Week 1:  PT Short Term Goal 1 (Week 1): STG = LTG d/t ELOS  Skilled Therapeutic Interventions/Progress Updates: Pt presents sitting in w/c and agreeable to therapy.  Pt w/ expressive aphasia but does answer questions appropriately.  Pt transfers sit to stand w/ CGA throughout session.  Pt amb w/o AD and CGA up to 200' w/ improved L foot clearance and equal step length.  Pt performed cone obstacle course w/ noted L neglect and cueing given for cones to left.  Pt then performed cone obstacle course and tapping w/ alternating feet.  Pt w/ LOB x 3 w/ LLE weight bearing.  Pt cued for stability before stance and improved.  Pt performed toe taps to 6 step and then progressed to alternating step-ups w/ occasional scuffing L toes.  Pt performed standing horseshoe toss, reaching to right and then having to return to midline and scan for peg to left of center.  1st trial, pt throwing straight ahead, but w/ cueing for scanning, pt increased accuracy to peg.  Pt performed 5 STS in 19 seconds.  Pt amb 200+ trials x 3 w/ increased cueing for L neglect.  Pt returned to room and remained sitting in w/c w/ chair alarm on and all needs in reach, family present.     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

## 2023-12-26 NOTE — Progress Notes (Signed)
 PROGRESS NOTE   Subjective/Complaints:   Pt doing fine, states he slept ok but woken up early this morning. Denies pain. LBM yesterday per pt but not documented. Urinating fine. No other complaints or concerns but difficult to discern if there are any issues d/t aphasia.   ROS: limited due to aphasia  Objective:   VAS US  CAROTID Result Date: 12/25/2023 Carotid Arterial Duplex Study Patient Name:  Alan Wise  Date of Exam:   12/24/2023 Medical Rec #: [id number]          Accession #:    [id number] Date of Birth: 1971-04-11         Patient Gender: M Patient Age:   53 years Exam Location:  [facility] Procedure:      VAS US  CAROTID Referring Phys: DEVON SHAFER --------------------------------------------------------------------------------  Indications:       CVA. Risk Factors:      Hyperlipidemia, Diabetes. Other Factors:     GERD. Comparison Study:  No priors. Performing Technologist: Ricka Sturdivant-Jones RDMS, RVT  Examination Guidelines: A complete evaluation includes B-mode imaging, spectral Doppler, color Doppler, and power Doppler as needed of all accessible portions of each vessel. Bilateral testing is considered an integral part of a complete examination. Limited examinations for reoccurring indications may be performed as noted.  Right Carotid Findings: +----------+--------+--------+--------+------------------+------------------+           PSV cm/sEDV cm/sStenosisPlaque DescriptionComments           +----------+--------+--------+--------+------------------+------------------+ CCA Prox  104     16                                                   +----------+--------+--------+--------+------------------+------------------+ CCA Distal89      18                                intimal thickening +----------+--------+--------+--------+------------------+------------------+ ICA Prox  74      20      1-39%    homogeneous                          +----------+--------+--------+--------+------------------+------------------+ ICA Mid   66      18                                                   +----------+--------+--------+--------+------------------+------------------+ ICA Distal60      22                                                   +----------+--------+--------+--------+------------------+------------------+ ECA       118     11                                                   +----------+--------+--------+--------+------------------+------------------+ +----------+--------+-------+----------------+-------------------+  PSV cm/sEDV cmsDescribe        Arm Pressure (mmHG) +----------+--------+-------+----------------+-------------------+ [id number]            Multiphasic, WNL                    +----------+--------+-------+----------------+-------------------+ +---------+--------+--+--------+--+---------+ VertebralPSV cm/s39EDV cm/s13Antegrade +---------+--------+--+--------+--+---------+  Left Carotid Findings: +----------+--------+--------+--------+------------------+--------+           PSV cm/sEDV cm/sStenosisPlaque DescriptionComments +----------+--------+--------+--------+------------------+--------+ CCA Prox  111     15                                         +----------+--------+--------+--------+------------------+--------+ CCA Distal109     19                                         +----------+--------+--------+--------+------------------+--------+ ICA Prox  56      13      1-39%   calcific                   +----------+--------+--------+--------+------------------+--------+ ICA Mid   57      19                                         +----------+--------+--------+--------+------------------+--------+ ICA Distal56      21                                          +----------+--------+--------+--------+------------------+--------+ ECA       78      8                                          +----------+--------+--------+--------+------------------+--------+ +----------+--------+--------+----------------+-------------------+           PSV cm/sEDV cm/sDescribe        Arm Pressure (mmHG) +----------+--------+--------+----------------+-------------------+ [id number]             Multiphasic, WNL                    +----------+--------+--------+----------------+-------------------+ +---------+--------+--+--------+--+---------+ VertebralPSV cm/s42EDV cm/s10Antegrade +---------+--------+--+--------+--+---------+   Summary: Right Carotid: Velocities in the right ICA are consistent with a 1-39% stenosis. Left Carotid: Velocities in the left ICA are consistent with a 1-39% stenosis. Vertebrals:  Bilateral vertebral arteries demonstrate antegrade flow. Subclavians: Normal flow hemodynamics were seen in bilateral subclavian              arteries. *See table(s) above for measurements and observations.  Electronically signed by Eather Popp MD on 12/25/2023 at 11:18:24 AM.    Final    CT HEAD WO CONTRAST ( ) Result Date: 12/25/2023 CLINICAL DATA:  Head trauma, minor, normal mental status (Age 52-52y) S/ FALL. EXAM: CT HEAD WITHOUT CONTRAST TECHNIQUE: Contiguous axial images were obtained from the base of the skull through the vertex without intravenous contrast. RADIATION DOSE REDUCTION: This exam was performed according to the departmental dose-optimization program which includes automated exposure control, adjustment of the mA and/or kV according to patient size and/or use of iterative reconstruction technique. COMPARISON:  CT head  12/22/2023.  MRI head 12/23/2023. FINDINGS: Brain: Similar oval vein right MCA and ACA territory infarcts without acute hemorrhage or progressive mass effect. Additional scattered white matter hypodensities, compatible with  chronic microvascular ischemic disease. No hydrocephalus. Cerebral atrophy. Similar dystrophic calcifications in the right frontal and periatrial region. Vascular: No hyperdense vessel identified. Skull: No acute fracture. Sinuses/Orbits: Paranasal sinus mucosal thickening. No acute orbital findings. IMPRESSION: 1. Similar right ACA and MCA territory infarcts without progressive mass effect or acute hemorrhage. MRI could provide more sensitive evaluation for new/interval acute infarct if clinically warranted. 2. Similar appearance of treated tumor with dystrophic calcifications. Electronically Signed   By: Gilmore GORMAN Molt M.D.   On: 12/25/2023 00:19   Recent Labs    12/25/23 0441  WBC 8.8  HGB 15.7  HCT 45.9  PLT 216   Recent Labs    12/25/23 0441  NA 138  K 3.8  CL 104  CO2 26  GLUCOSE 97  BUN 16  CREATININE 0.93  CALCIUM  9.2    Intake/Output Summary (Last 24 hours) at 12/26/2023 1025 Last data filed at 12/25/2023 1400 Gross per 24 hour  Intake 0 ml  Output --  Net 0 ml        Physical Exam: Vital Signs Blood pressure 105/66, pulse 63, temperature 98.3 F (36.8 C), temperature source Oral, resp. rate 18, height 5' 8.5 (1.74 m), weight 59.6 kg, SpO2 97%.    General: awake, alert, calm; NAD, sitting in w/c in therapy gym.  HENT: conjugate gaze; oropharynx moist- L facial droop and visual field deficit CV: regular rate and rhythm; no JVD Pulmonary: CTA B/L; no W/R/R- good air movement GI: soft, NT, ND, (+)BS Psychiatric: calm-  Neurological: aphasic severe- can answer yes/no, and can put together a couple words at a time sometimes  PRIOR EXAMS: Musculoskeletal:        General: Normal range of motion.     Cervical back: Neck supple. No tenderness.     Comments: RUE-  5-/5 throughout- grip is 5/5 - but couldn't get pt to let go in spite of hurting me LUE- biceps 4/5; triceps 4+/5; WE 4-/5; grip 4/5; FA 2+/5 RLE- 5-/5 throughout LLE- 4/5 throughout Holds LUE in  posturing/elbow flexed  Skin:    General: Skin is warm and dry.     Comments: R AC fossa IV- looks OK, but needs to come out in next 24-48 hours  Neurological:     Mental Status: He is alert.     Comments: Left facial weakness with mild to moderate dysarthria. Left sided weakness with flexor tone and sensory deficits affecting face, LUE/LLE. Expressive/receptive deficits with frustration when corrected. Extrmely frustrated, with significant perseveration, L inattention/neglect Decreased to light touch in L side throughout Severe expressive aphasia- a lot of repetition of and/the/etc- however also has mild to moderate receptive aphasia as well Increased tone mainly in LUE- elbow held in flexion at rest -MAS of 2 at elbow and shoulder- and MAS 1+ at wrist/fingers   Assessment/Plan: 1. Functional deficits which require 3+ hours per day of interdisciplinary therapy in a comprehensive inpatient rehab setting. Physiatrist is providing close team supervision and 24 hour management of active medical problems listed below. Physiatrist and rehab team continue to assess barriers to discharge/monitor patient progress toward functional and medical goals  Care Tool:  Bathing    Body parts bathed by patient: Right arm, Left arm, Chest, Abdomen, Front perineal area, Right upper leg, Left upper leg, Right lower leg, Left lower  leg, Face   Body parts bathed by helper: Buttocks, Right arm     Bathing assist Assist Level: Maximal Assistance - Patient 24 - 49%     Upper Body Dressing/Undressing Upper body dressing   What is the patient wearing?: Pull over shirt    Upper body assist Assist Level: Minimal Assistance - Patient > 75%    Lower Body Dressing/Undressing Lower body dressing      What is the patient wearing?: Underwear/pull up, Pants     Lower body assist Assist for lower body dressing: Minimal Assistance - Patient > 75%     Toileting Toileting    Toileting assist Assist for  toileting: Moderate Assistance - Patient 50 - 74%     Transfers Chair/bed transfer  Transfers assist     Chair/bed transfer assist level: Contact Guard/Touching assist     Locomotion Ambulation   Ambulation assist      Assist level: Contact Guard/Touching assist Assistive device: No Device Max distance: 200   Walk 10 feet activity   Assist     Assist level: Contact Guard/Touching assist Assistive device: No Device   Walk 50 feet activity   Assist    Assist level: Contact Guard/Touching assist Assistive device: No Device    Walk 150 feet activity   Assist    Assist level: Contact Guard/Touching assist Assistive device: No Device    Walk 10 feet on uneven surface  activity   Assist Walk 10 feet on uneven surfaces activity did not occur: Safety/medical concerns         Wheelchair     Assist Is the patient using a wheelchair?: Yes      Wheelchair assist level: Dependent - Patient 0% Max wheelchair distance: 200 ft    Wheelchair 50 feet with 2 turns activity    Assist        Assist Level: Dependent - Patient 0%   Wheelchair 150 feet activity     Assist      Assist Level: Dependent - Patient 0%   Blood pressure 105/66, pulse 63, temperature 98.3 F (36.8 C), temperature source Oral, resp. rate 18, height 5' 8.5 (1.74 m), weight 59.6 kg, SpO2 97%.  Medical Problem List and Plan: 1. 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  Antithrombotics: -DVT/anticoagulation:  Pharmaceutical: Eliquis  5mg  BID- cannot stop             -antiplatelet therapy: N/A 3. Pain Management: Tylenol  PRN.  4. Mood/Behavior/Sleep: LCSW to follow for  evaluation and support.              -antipsychotic agents: N/A  -Melatonin PRN 5. Neuropsych/cognition: This patient is not fully capable of making decisions on his own behalf. 6. Skin/Wound Care: Routine pressure relief measure.  7. Fluids/Electrolytes/Nutrition: monitor I/O. Routine labs. Cont supplements.  8. T2DM: Hgb A1c-5.4 and diet controlled.  -12/26/23 CBGs fine, not on SSI, no CBG checks ordered-- will tell nursing this is not needed.   CBG (last 3)  Recent Labs    12/25/23 1620 12/25/23 2132 12/26/23 9388  GLUCAP  88 130* 119*    9.  HTN: Monitor BP TID--avoid hypoperfusion. Continue Amlodipine  5 mg/day and propranolol  20 mg bid. 7/4-5 BP is well controlled  Vitals:   12/24/23 2252 12/24/23 2350 12/25/23 0032 12/25/23 0100  BP: 123/83 (!) 141/87 (!) 133/90 122/60   12/25/23 0125 12/25/23 0324 12/25/23 0523 12/25/23 0738  BP: 128/64 126/85 122/73 121/79   12/25/23 1300 12/25/23 1618 12/25/23 2137 12/26/23 0540  BP: 130/77 109/70 125/89 105/66    10. Dysphagia: NO straws to decrease aspiration events.  --GI consult recommended as retention of solids contribute to aspiration. Per Dr. Lizabeth note this was discussed with GI and who felt patient to be too high risk for GI procedures and recommended follow up after d/c. -SLP following  11. Seizure d/o; On Lamictal  150 mg am and 200 mg pm-->educate on compliance.  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  13. Dyslipidemia: LDL-129 and HDL 27. On Lipitor 40 mg/day.  14. Frequency/Nocturia: Gets up at nights and pt/wife agreed to toileting at 11 pm and 3 am to prevent attempts out of bed.  7/4- pt refused these scheduled get up times, but fell- asked nursing to try and get him up for nocturia 15. Spasticity - concerned about starting Any spasticity meds due to risk of sedation/confusion - suggest to be assessed for Botulinum toxin for LUE.   16. L inattention/neglect and  perseveration- will d/w primary CIR physician- Dr Emeline.  17. Bowels: no BM documented since 7/1 but pt states he's going daily, however history is limited d/t aphasia. Adamant that he had a BM yesterday. Asked nursing to please document.       LOS: 2 days A FACE TO FACE EVALUATION WAS PERFORMED  [address] 12/26/2023, 10:25 AM

## 2023-12-26 NOTE — Progress Notes (Signed)
 Speech Language Pathology Daily Session Note  Patient Details  Name: Alan Wise MRN: [id number] Date of Birth: 05-22-1971  Today's Date: 12/26/2023 SLP Individual Time: 1420-1510 SLP Individual Time Calculation (min): 50 min  Short Term Goals: Week 1: SLP Short Term Goal 1 (Week 1): STGs = LTGs d/t ELOS  Skilled Therapeutic Interventions: Skilled therapy session focused on communication goals. SLP facilitated session by targeting expressive language goals. Patient completed automatic speech tasks with minA including numbers, days of the week and months of the year. SLP initiated script training with sentences including name, age and bathroom/food/drink. Patient benefitted most from repetition of sentences rather than reading due to L vision deficits. As patient repeated sentences, fluency increased. Patient continues to present with jargon during expressive language, though with functional ability to answer simple yes/no questions during session. Patient left in bed with alarm set and call bell in reach. Continue POC.    Pain None verbalized nor visualized  Therapy/Group: Individual Therapy  Raeven Pint M.A., CCC-SLP 12/26/2023, 7:48 AM

## 2023-12-26 NOTE — Progress Notes (Signed)
 Patient was restful and without distress throughout the shift, Denies pain very aware of his surrounding, Tele-sitter continue due to impulsive episode, easily redirected. Sleep chart result indicate patient slept approximately 10.5 hours /12 hours. Continue to monitor and assess, refer to assessment data she as needed

## 2023-12-26 NOTE — Progress Notes (Signed)
 Occupational Therapy Session Note  Patient Details  Name: Alan Wise MRN: [id number] Date of Birth: 07/17/1970  Today's Date: 12/26/2023 OT Individual Time: 8891-8794 OT Individual Time Calculation (min): 57 min    Short Term Goals: Week 1:  OT Short Term Goal 1 (Week 1): STG=LTG d/t ELOS  Skilled Therapeutic Interventions/Progress Updates: patient seated in w/c in room upon approach for OT was expressed concurrence to participate in OT services.   Focus of session was as follows:  Dressing = overall moderate assistance (topographical orientation to correctly don shirt, zippers, buttons, tie shoes) Sit to stand = close S Dynamic balance in prep for self care and functional activities = CGA to close S  At end of session patient was left seated next to bed with lap alarm engaged, call bell within reach and with his parents' church pastor visiting.  Patient will benefit from continued OT services to address increased functional abilities, including ADLS and IADLs.  Continue OT Plan of Care     Therapy Documentation Precautions:  Precautions Precautions: Fall Recall of Precautions/Restrictions: Intact Precaution/Restrictions Comments: L visual field cut and L hemipareisis at baseline, expressive > receptive aphasia Restrictions Weight Bearing Restrictions Per Provider Order: No Other Position/Activity Restrictions: L visual field cut at baseline  Pain:denied    Therapy/Group: Individual Therapy  Waneta Medici [facility] 12/26/2023, 5:34 PM

## 2023-12-27 DIAGNOSIS — I63521 Cerebral infarction due to unspecified occlusion or stenosis of right anterior cerebral artery: Secondary | ICD-10-CM | POA: Diagnosis not present

## 2023-12-27 DIAGNOSIS — K5901 Slow transit constipation: Secondary | ICD-10-CM | POA: Diagnosis not present

## 2023-12-27 DIAGNOSIS — I10 Essential (primary) hypertension: Secondary | ICD-10-CM | POA: Diagnosis not present

## 2023-12-27 LAB — GLUCOSE, CAPILLARY: Glucose-Capillary: 146 mg/dL — ABNORMAL HIGH (ref 70–99)

## 2023-12-27 MED ORDER — POLYETHYLENE GLYCOL 3350 17 G PO PACK
17.0000 g | PACK | Freq: Every day | ORAL | Status: DC
  Administered 2023-12-27: 17 g via ORAL
  Filled 2023-12-27: qty 1

## 2023-12-27 NOTE — Progress Notes (Signed)
 PROGRESS NOTE   Subjective/Complaints:   Pt doing well today, states he slept ok. Denies pain. LBM yesterday per pt but still nothing documented since 7/1. Urinating fine. No other complaints or concerns but difficult to discern if there are any issues d/t aphasia.   ROS: limited due to aphasia  Objective:   No results found.  Recent Labs    12/25/23 0441  WBC 8.8  HGB 15.7  HCT 45.9  PLT 216   Recent Labs    12/25/23 0441  NA 138  K 3.8  CL 104  CO2 26  GLUCOSE 97  BUN 16  CREATININE 0.93  CALCIUM  9.2    Intake/Output Summary (Last 24 hours) at 12/27/2023 0957 Last data filed at 12/26/2023 1900 Gross per 24 hour  Intake 240 ml  Output --  Net 240 ml        Physical Exam: Vital Signs Blood pressure 128/77, pulse 64, temperature 97.6 F (36.4 C), temperature source Oral, resp. rate 18, height 5' 8.5 (1.74 m), weight 59.6 kg, SpO2 98%.    General: awake, alert, calm; NAD, resting comfortably in bed. SABRA  HENT: conjugate gaze; oropharynx moist- L facial droop and visual field deficit CV: regular rate and rhythm; no JVD Pulmonary: CTA B/L; no W/R/R- good air movement GI: soft, NT, ND, (+)BS Psychiatric: calm-  Neurological: aphasic severe- can answer yes/no, and can put together a couple words at a time sometimes  PRIOR EXAMS: Musculoskeletal:        General: Normal range of motion.     Cervical back: Neck supple. No tenderness.     Comments: RUE-  5-/5 throughout- grip is 5/5 - but couldn't get pt to let go in spite of hurting me LUE- biceps 4/5; triceps 4+/5; WE 4-/5; grip 4/5; FA 2+/5 RLE- 5-/5 throughout LLE- 4/5 throughout Holds LUE in posturing/elbow flexed  Skin:    General: Skin is warm and dry.     Comments: R AC fossa IV- looks OK, but needs to come out in next 24-48 hours  Neurological:     Mental Status: He is alert.     Comments: Left facial weakness with mild to moderate dysarthria.  Left sided weakness with flexor tone and sensory deficits affecting face, LUE/LLE. Expressive/receptive deficits with frustration when corrected. Extrmely frustrated, with significant perseveration, L inattention/neglect Decreased to light touch in L side throughout Severe expressive aphasia- a lot of repetition of and/the/etc- however also has mild to moderate receptive aphasia as well Increased tone mainly in LUE- elbow held in flexion at rest -MAS of 2 at elbow and shoulder- and MAS 1+ at wrist/fingers   Assessment/Plan: 1. Functional deficits which require 3+ hours per day of interdisciplinary therapy in a comprehensive inpatient rehab setting. Physiatrist is providing close team supervision and 24 hour management of active medical problems listed below. Physiatrist and rehab team continue to assess barriers to discharge/monitor patient progress toward functional and medical goals  Care Tool:  Bathing    Body parts bathed by patient: Right arm, Left arm, Chest, Abdomen, Front perineal area, Right upper leg, Left upper leg, Right lower leg, Left lower leg, Face   Body parts bathed  by helper: Buttocks, Right arm     Bathing assist Assist Level: Maximal Assistance - Patient 24 - 49%     Upper Body Dressing/Undressing Upper body dressing   What is the patient wearing?: Pull over shirt    Upper body assist Assist Level: Minimal Assistance - Patient > 75%    Lower Body Dressing/Undressing Lower body dressing      What is the patient wearing?: Underwear/pull up, Pants     Lower body assist Assist for lower body dressing: Minimal Assistance - Patient > 75%     Toileting Toileting    Toileting assist Assist for toileting: Moderate Assistance - Patient 50 - 74%     Transfers Chair/bed transfer  Transfers assist     Chair/bed transfer assist level: Contact Guard/Touching assist     Locomotion Ambulation   Ambulation assist      Assist level: Contact  Guard/Touching assist Assistive device: No Device Max distance: 200   Walk 10 feet activity   Assist     Assist level: Contact Guard/Touching assist Assistive device: No Device   Walk 50 feet activity   Assist    Assist level: Contact Guard/Touching assist Assistive device: No Device    Walk 150 feet activity   Assist    Assist level: Contact Guard/Touching assist Assistive device: No Device    Walk 10 feet on uneven surface  activity   Assist Walk 10 feet on uneven surfaces activity did not occur: Safety/medical concerns         Wheelchair     Assist Is the patient using a wheelchair?: Yes      Wheelchair assist level: Dependent - Patient 0% Max wheelchair distance: 200 ft    Wheelchair 50 feet with 2 turns activity    Assist        Assist Level: Dependent - Patient 0%   Wheelchair 150 feet activity     Assist      Assist Level: Dependent - Patient 0%   Blood pressure 128/77, pulse 64, temperature 97.6 F (36.4 C), temperature source Oral, resp. rate 18, height 5' 8.5 (1.74 m), weight 59.6 kg, SpO2 98%.  Medical Problem List and Plan: 1. 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  Antithrombotics: -DVT/anticoagulation:  Pharmaceutical: Eliquis  5mg  BID- cannot stop             -antiplatelet therapy: N/A 3. Pain Management: Tylenol  PRN.  4. Mood/Behavior/Sleep: LCSW to follow for evaluation and support.              -antipsychotic agents: N/A  -Melatonin PRN 5. Neuropsych/cognition: This patient is not fully capable of making decisions on his own behalf. 6. Skin/Wound Care: Routine pressure relief measure.  7.  Fluids/Electrolytes/Nutrition: monitor I/O. Routine labs. Cont supplements.  8. T2DM: Hgb A1c-5.4 and diet controlled.  -12/26/23 CBGs fine, not on SSI, no CBG checks ordered-- will tell nursing this is not needed.   CBG (last 3)  Recent Labs    12/25/23 1620 12/25/23 2132 12/26/23 0611  GLUCAP 88 130* 119*    9.  HTN: Monitor BP TID--avoid hypoperfusion. Continue Amlodipine  5 mg/day and propranolol  20 mg bid. 7/4-6 BP is well controlled  Vitals:   12/25/23 0100 12/25/23 0125 12/25/23 0324 12/25/23 0523  BP: 122/60 128/64 126/85 122/73   12/25/23 0738 12/25/23 1300 12/25/23 1618 12/25/23 2137  BP: 121/79 130/77 109/70 125/89   12/26/23 0540 12/26/23 1310 12/26/23 2018 12/27/23 0300  BP: 105/66 (!) 116/92 128/81 128/77    10. Dysphagia: NO straws to decrease aspiration events.  --GI consult recommended as retention of solids contribute to aspiration. Per Dr. Lizabeth note this was discussed with GI and who felt patient to be too high risk for GI procedures and recommended follow up after d/c. -SLP following  11. Seizure d/o; On Lamictal  150 mg am and 200 mg pm-->educate on compliance.  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  13. Dyslipidemia: LDL-129 and HDL 27. On Lipitor 40 mg/day.  14. Frequency/Nocturia: Gets up at nights and pt/wife agreed to toileting at 11 pm and 3 am to prevent attempts out of bed.  7/4- pt refused these scheduled get up times, but fell- asked nursing to try and get him up for nocturia 15. Spasticity - concerned about starting Any spasticity meds due to risk of sedation/confusion - suggest to be assessed for Botulinum toxin for LUE.   16. L inattention/neglect and perseveration- will d/w primary CIR physician- Dr Emeline.   17. Bowels: no BM documented since 7/1 but pt states he's going daily, however history is limited d/t aphasia. Adamant that he had a BM yesterday. Asked nursing to please document.    -12/27/23 still no BM documented since 7/1. Will start miralax  daily for now      LOS: 3 days A FACE TO FACE EVALUATION WAS PERFORMED  [address] 12/27/2023, 9:57 AM

## 2023-12-27 NOTE — Progress Notes (Signed)
 Speech Language Pathology Daily Session Note  Patient Details  Name: Alan Wise MRN: [id number] Date of Birth: 05/30/1971  Today's Date: 12/27/2023 SLP Individual Time: 8597-8565 SLP Individual Time Calculation (min): 32 min  Short Term Goals: Week 1: SLP Short Term Goal 1 (Week 1): STGs = LTGs d/t ELOS  Skilled Therapeutic Interventions:  Pt seen for ST targeting aphasia goals. Upon SLP arrival, pt was in bed with wife/kids present and was highly agitated, yelling for them to go away and get out. Pt's wife reported he became frustrated at the children when she briefly used the restroom. NSG also arrived shortly thereafter and SLP/NSG provided de-escalation. Pt's family stepped outside. After this, pt was able to calm enough for ST. SLP used y/n questions to determine pt's wants/needs. He denied pain. He used nonverbal gesturing to express he was frustrated at his family and nodded 'yes' when asked if he was overstimulated. SLP attempted to build rapport and provided support. Pt then indiciated he was ready to try tx tasks. SLP facilitated automatic speech tasks of counting and pt mirrored SLP's fingers during counting but became quickly agitated (hitting pillow and bed with his fist) when SLP gently encouraged him to verbalize or participate in simultaneous production of words. SLP then attempted to practice scripts but pt again became frustrated quickly and covered his face. SLP attempted to reduce demands and involve pt in decision on next tx tasks but it was unsuccessful. When asked y/n question of if pt was willing to practice speaking today, he shook his head 'no'. Pt with increased frustration during attempts at tx tasks and used nonverbal gesturing to indicate he wanted SLP to leave. When asked a y/n question of if he wanted the session to end, pt nodded 'yes'; therefore, session discontinued and 28 minutes of tx missed. Pt left in bed with call bell in reach and bed alarm active. SLP  provided education to pt's wife on strategies attempted and that pt would be an excellent candidate for AAC. Wife reports pt is easily overstimulated visually as one concern she has with AAC. SLP verbalized understanding of concern and provided education on variety of AAC options; highly encouraged it as pt's attention and tolerance improve. SLP informed NSG of pt's continued agitation and missed tx time. Continue ST POC.   Pain Pain Assessment Pain Scale: 0-10 Pain Score: 0-No pain Patients Stated Pain Goal: 0  Therapy/Group: Individual Therapy  Waddell JONETTA Novak, MA CCC-SLP 12/27/2023, 4:10 PM

## 2023-12-27 NOTE — Progress Notes (Signed)
 Occupational Therapy Session Note  Patient Details  Name: Alan Wise MRN: [id number] Date of Birth: [birth year]  {CHL IP REHAB OT TIME CALCULATIONS:[id number]}   Short Term Goals: Week 1:  OT Short Term Goal 1 (Week 1): STG=LTG d/t ELOS  Skilled Therapeutic Interventions/Progress Updates:  Pt greeted *** for skilled OT session with focus on ***.   Pain: Pt reported ***/10 pain, stating *** in reference to ***. OT offering intermediate rest breaks and positioning suggestions throughout session to address pain/fatigue and maximize participation/safety in session.   Functional Transfers:  Self Care Tasks: Pt completes the following self care tasks with levels of assistance noted below, UB: LB:   Therapeutic Activities:  Therapeutic Exercise:   Education:  Pt remained *** with 4Ps assessed and immediate needs met. Pt continues to be appropriate for skilled OT intervention to promote further functional independence in ADLs/IADLs.    Therapy Documentation Precautions:  Precautions Precautions: Fall Recall of Precautions/Restrictions: Intact Precaution/Restrictions Comments: L visual field cut and L hemipareisis at baseline, expressive > receptive aphasia Restrictions Weight Bearing Restrictions Per Provider Order: No Other Position/Activity Restrictions: L visual field cut at baseline   Therapy/Group: Individual Therapy  Nereida Habermann, OTR/L, MSOT  12/27/2023, 8:51 PM

## 2023-12-27 NOTE — IPOC Note (Signed)
 Overall Plan of Care [facility]) Patient Details Name: Alan Wise MRN: [id number] DOB: 1970/10/13  Admitting Diagnosis: Acute right ACA stroke [facility])  Hospital Problems: Principal Problem:   Acute right ACA stroke (HCC)     Functional Problem List: Nursing Safety, Medication Management, Endurance, Bladder  PT Balance, Endurance, Motor, Perception, Safety, Sensory  OT Balance, Vision, Cognition, Endurance, Motor, Perception, Safety, Sensory  SLP Behavior, Cognition, Linguistic  TR         Basic ADL's: OT Grooming, Bathing, Dressing, Toileting     Advanced  ADL's: OT       Transfers: PT Bed Mobility, Bed to Chair, Car, Lobbyist, Technical brewer: PT Ambulation, Psychologist, prison and probation services, Stairs     Additional Impairments: OT Fuctional Use of Upper Extremity  SLP Communication, Swallowing, Social Cognition expression, comprehension Awareness  TR      Anticipated Outcomes Item Anticipated Outcome  Self Feeding Mod I  Swallowing      Basic self-care  SUP  Toileting  SUP   Bathroom Transfers SUP-CGA  Bowel/Bladder  manage bladder w toileting protocol  Transfers  supervision  Locomotion  supervision  Communication  maxA  Cognition  maxA  Pain  n/a  Safety/Judgment  manage safety w cues   Therapy Plan: PT Intensity: Minimum of 1-2 x/day ,45 to 90 minutes PT Frequency: 5 out of 7 days PT Duration Estimated Length of Stay: 7-9 days OT Intensity: Minimum of 1-2 x/day, 45 to 90 minutes OT Frequency: 5 out of 7 days OT Duration/Estimated Length of Stay: 7-9 days SLP Intensity: Minumum of 1-2 x/day, 30 to 90 minutes SLP Frequency: 3 to 5 out of 7 days SLP Duration/Estimated Length of Stay: 7-10 days   Team Interventions: Nursing Interventions Patient/Family Education, Dysphagia/Aspiration Precaution Training, Medication Management, Bladder Management, Discharge Planning, Disease Management/Prevention  PT interventions Ambulation/gait  training, Community reintegration, DME/adaptive equipment instruction, Neuromuscular re-education, Psychosocial support, Stair training, UE/LE Strength taining/ROM, UE/LE Coordination activities, Wheelchair propulsion/positioning, Therapeutic Activities, Skin care/wound management, Pain management, Functional electrical stimulation, Warden/ranger, Discharge planning, Cognitive remediation/compensation, Disease management/prevention, Functional mobility training, Patient/family education, Splinting/orthotics, Therapeutic Exercise, Visual/perceptual remediation/compensation  OT Interventions Warden/ranger, DME/adaptive equipment instruction, Patient/family education, Therapeutic Activities, Cognitive remediation/compensation, Functional electrical stimulation, Psychosocial support, Therapeutic Exercise, Community reintegration, Self Care/advanced ADL retraining, UE/LE Strength taining/ROM, Functional mobility training, Discharge planning, Neuromuscular re-education, Skin care/wound managment, UE/LE Coordination activities, Disease mangement/prevention, Pain management, Splinting/orthotics, Visual/perceptual remediation/compensation  SLP Interventions Cognitive remediation/compensation, Dysphagia/aspiration precaution training, Internal/external aids, Speech/Language facilitation, Cueing hierarchy, Environmental controls, Therapeutic Activities, Therapeutic Exercise, Patient/family education, Multimodal communication approach, Functional tasks  TR Interventions    SW/CM Interventions     Barriers to Discharge MD  Medical stability  Nursing Decreased caregiver support, Home environment access/layout 2 level 4 ste bil rails w spouse  PT Insurance for SNF coverage    OT      SLP      SW       Team Discharge Planning: Destination: PT-Home ,OT- Home , SLP-Home Projected Follow-up: PT-Home health PT, Outpatient PT, 24 hour supervision/assistance, OT-  Outpatient OT,  SLP-Outpatient SLP, 24 hour supervision/assistance Projected Equipment Needs: PT-To be determined, OT- To be determined, SLP-None recommended by SLP Equipment Details: PT- , OT-  Patient/family involved in discharge planning: PT- Patient,  OT-Patient, SLP-Patient, Patient unable/family or caregive not available  MD ELOS: 7-10 days Medical Rehab Prognosis:  Excellent Assessment: The patient has been admitted for CIR therapies with the diagnosis of R ACA infarct.  The team will be addressing functional mobility, strength, stamina, balance, safety, adaptive techniques and equipment, self-care, bowel and bladder mgt, patient and caregiver education. Goals have been set at S for PT/OT, min-Mod A for SLP. Anticipated discharge destination is home.        See Team Conference Notes for weekly updates to the plan of care

## 2023-12-28 ENCOUNTER — Inpatient Hospital Stay (HOSPITAL_COMMUNITY)

## 2023-12-28 ENCOUNTER — Telehealth: Payer: Self-pay | Admitting: *Deleted

## 2023-12-28 DIAGNOSIS — I63521 Cerebral infarction due to unspecified occlusion or stenosis of right anterior cerebral artery: Secondary | ICD-10-CM | POA: Diagnosis not present

## 2023-12-28 DIAGNOSIS — R569 Unspecified convulsions: Secondary | ICD-10-CM | POA: Diagnosis not present

## 2023-12-28 LAB — CBC
HCT: 43.9 % (ref 39.0–52.0)
Hemoglobin: 14.9 g/dL (ref 13.0–17.0)
MCH: 29.6 pg (ref 26.0–34.0)
MCHC: 33.9 g/dL (ref 30.0–36.0)
MCV: 87.1 fL (ref 80.0–100.0)
Platelets: 202 K/uL (ref 150–400)
RBC: 5.04 MIL/uL (ref 4.22–5.81)
RDW: 12.6 % (ref 11.5–15.5)
WBC: 7.4 K/uL (ref 4.0–10.5)
nRBC: 0 % (ref 0.0–0.2)

## 2023-12-28 LAB — BASIC METABOLIC PANEL WITH GFR
Anion gap: 9 (ref 5–15)
BUN: 17 mg/dL (ref 6–20)
CO2: 27 mmol/L (ref 22–32)
Calcium: 9.5 mg/dL (ref 8.9–10.3)
Chloride: 104 mmol/L (ref 98–111)
Creatinine, Ser: 0.88 mg/dL (ref 0.61–1.24)
GFR, Estimated: >60 mL/min (ref >60)
Glucose, Bld: 99 mg/dL (ref 70–99)
Potassium: 3.9 mmol/L (ref 3.5–5.1)
Sodium: 140 mmol/L (ref 135–145)

## 2023-12-28 LAB — GLUCOSE, CAPILLARY: Glucose-Capillary: 138 mg/dL — ABNORMAL HIGH (ref 70–99)

## 2023-12-28 MED ORDER — SODIUM CHLORIDE 0.9 % IV SOLN: INTRAVENOUS | Status: DC

## 2023-12-28 MED ORDER — SENNOSIDES-DOCUSATE SODIUM 8.6-50 MG PO TABS
1.0000 | ORAL_TABLET | Freq: Two times a day (BID) | ORAL | Status: DC
  Administered 2023-12-28 – 2024-01-11 (×28): 1 via ORAL
  Filled 2023-12-28 (×28): qty 1

## 2023-12-28 MED ORDER — PROCHLORPERAZINE 25 MG RE SUPP: 12.5000 mg | Freq: Four times a day (QID) | RECTAL | Status: DC | PRN

## 2023-12-28 MED ORDER — LORAZEPAM 2 MG/ML IJ SOLN: 0.5000 mg | Freq: Four times a day (QID) | INTRAMUSCULAR | Status: DC | PRN

## 2023-12-28 MED ORDER — LAMOTRIGINE 100 MG PO TABS
200.0000 mg | ORAL_TABLET | Freq: Every day | ORAL | Status: DC
  Administered 2023-12-28 – 2024-01-05 (×9): 200 mg via ORAL
  Filled 2023-12-28 (×9): qty 2

## 2023-12-28 MED ORDER — LAMOTRIGINE 100 MG PO TABS
200.0000 mg | ORAL_TABLET | Freq: Every day | ORAL | Status: DC
  Administered 2023-12-29 – 2024-01-05 (×8): 200 mg via ORAL
  Filled 2023-12-28 (×10): qty 2

## 2023-12-28 MED ORDER — LORAZEPAM 2 MG/ML IJ SOLN: 1.0000 mg | Freq: Four times a day (QID) | INTRAMUSCULAR | Status: DC | PRN

## 2023-12-28 MED ORDER — PROCHLORPERAZINE MALEATE 5 MG PO TABS
5.0000 mg | ORAL_TABLET | Freq: Four times a day (QID) | ORAL | Status: DC | PRN
  Filled 2023-12-28: qty 1

## 2023-12-28 MED ORDER — POLYETHYLENE GLYCOL 3350 17 G PO PACK
17.0000 g | PACK | Freq: Two times a day (BID) | ORAL | Status: DC
  Administered 2023-12-28 – 2023-12-29 (×2): 17 g via ORAL
  Filled 2023-12-28: qty 1

## 2023-12-28 MED ORDER — PROCHLORPERAZINE EDISYLATE 10 MG/2ML IJ SOLN: 5.0000 mg | Freq: Four times a day (QID) | INTRAMUSCULAR | Status: DC | PRN

## 2023-12-28 NOTE — Progress Notes (Signed)
 Physical Therapy Session Note  Patient Details  Name: Alan Wise MRN: [id number] Date of Birth: 07-21-1970  Today's Date: 12/28/2023 PT Missed Time: 60 Minutes Missed Time Reason:    Short Term Goals: Week 1:  PT Short Term Goal 1 (Week 1): STG = LTG d/t ELOS  Skilled Therapeutic Interventions/Progress Updates:     Pt politely declines therapy at this time due to fatigue. PT will follow up as able.   Therapy Documentation Precautions:  Precautions Precautions: Fall Recall of Precautions/Restrictions: Intact Precaution/Restrictions Comments: L visual field cut and L hemipareisis at baseline, expressive > receptive aphasia Restrictions Weight Bearing Restrictions Per Provider Order: No Other Position/Activity Restrictions: L visual field cut at baseline General: PT Amount of Missed Time (min): 60 Minutes   Therapy/Group: Individual Therapy  [name], PT DPT 12/28/2023, 4:40 PM

## 2023-12-28 NOTE — Care Management (Signed)
 Inpatient Rehabilitation Center Individual Statement of Services  Patient Name:  Alan Wise  Date:  12/28/2023  Welcome to the Inpatient Rehabilitation Center.  Our goal is to provide you with an individualized program based on your diagnosis and situation, designed to meet your specific needs.  With this comprehensive rehabilitation program, you will be expected to participate in at least 3 hours of rehabilitation therapies Monday-Friday, with modified therapy programming on the weekends.  Your rehabilitation program will include the following services:  Physical Therapy (PT), Occupational Therapy (OT), Speech Therapy (ST), 24 hour per day rehabilitation nursing, Therapeutic Recreaction (TR), Psychology, Neuropsychology, Care Coordinator, Rehabilitation Medicine, Nutrition Services, Pharmacy Services, and Other  Weekly team conferences will be held on Tuesdays to discuss your progress.  Your Inpatient Rehabilitation Care Coordinator will talk with you frequently to get your input and to update you on team discussions.  Team conferences with you and your family in attendance may also be held.  Expected length of stay: 7-9 days  Overall anticipated outcome: Supervision  Depending on your progress and recovery, your program may change. Your Inpatient Rehabilitation Care Coordinator will coordinate services and will keep you informed of any changes. Your Inpatient Rehabilitation Care Coordinator's name and contact numbers are listed  below.  The following services may also be recommended but are not provided by the Inpatient Rehabilitation Center:  Driving Evaluations Home Health Rehabiltiation Services Outpatient Rehabilitation Services Vocational Rehabilitation   Arrangements will be made to provide these services after discharge if needed.  Arrangements include referral to agencies that provide these services.  Your insurance has been verified to be:  [facility]  Your  primary doctor is:  Angeline Laura  Pertinent information will be shared with your doctor and your insurance company.  Inpatient Rehabilitation Care Coordinator:  Graeme Feliciana SILK [phone] or ([phone]  Information discussed with and copy given to patient by: Graeme DELENA Feliciana, 12/28/2023, 11:16 AM

## 2023-12-28 NOTE — Progress Notes (Signed)
 EEG complete - results pending

## 2023-12-28 NOTE — Progress Notes (Addendum)
 PROGRESS NOTE   Subjective/Complaints:   No events overnight.  No acute complaints.  Patient resting in bed, eating breakfast on exam. Vitals stable     12/28/2023    5:51 AM 12/27/2023    8:26 PM 12/27/2023    3:59 PM  Vitals with BMI  Systolic 112 125 875  Diastolic 67 71 78  Pulse 55 63 58    Recent Labs    12/25/23 2132 12/26/23 0611 12/27/23 1118  GLUCAP 130* 119* 146*    Labs stable.  P.o. intakes appropriate  Continent of bladder  Last BM 7/1 per chart   ROS: limited due to aphasia  Objective:   No results found.  Recent Labs    12/28/23 0501  WBC 7.4  HGB 14.9  HCT 43.9  PLT 202   Recent Labs    12/28/23 0501  NA 140  K 3.9  CL 104  CO2 27  GLUCOSE 99  BUN 17  CREATININE 0.88  CALCIUM  9.5    Intake/Output Summary (Last 24 hours) at 12/28/2023 0743 Last data filed at 12/27/2023 1955 Gross per 24 hour  Intake 360 ml  Output --  Net 360 ml        Physical Exam: Vital Signs Blood pressure 112/67, pulse (!) 55, temperature 97.7 F (36.5 C), resp. rate 17, height 5' 8.5 (1.74 m), weight 59.6 kg, SpO2 94%.    General: awake, alert, calm; NAD, laying in bed. HENT: conjugate gaze; oropharynx moist.  Tracks bilaterally with encouragement. CV: regular rate and rhythm; no JVD Pulmonary: CTA B/L; no W/R/R- good air movement GI: soft, NT, ND, (+)BS. Psychiatric: calm-cooperative with exam.   Neurological:  Awake, alert. Severe expressive aphasia, moderate receptive deficits, can occasionally answer yes/no appropriately.  Can follow basic commands with visual cues. Left hemineglect, can extinguish with repeated stimuli.  Strength: RUE-5 out of 5 throughout LUE-4-5 shoulder abduction, biceps 4/5; triceps 4+/5; WE 4-/5; grip 4/5; FA 3+/5 RLE- 5-/5 throughout LLE- 4/5 throughout Decree sensation to light touch throughout the left hemibody Cranial nerves: Left facial droop, visual field  cut versus an intention Tone: MAS 1+ left elbow, shoulder, MAS 1 wrist/fingers   Assessment/Plan: 1. Functional deficits which require 3+ hours per day of interdisciplinary therapy in a comprehensive inpatient rehab setting. Physiatrist is providing close team supervision and 24 hour management of active medical problems listed below. Physiatrist and rehab team continue to assess barriers to discharge/monitor patient progress toward functional and medical goals  Care Tool:  Bathing    Body parts bathed by patient: Right arm, Left arm, Chest, Abdomen, Front perineal area, Right upper leg, Left upper leg, Right lower leg, Left lower leg, Face   Body parts bathed by helper: Buttocks, Right arm     Bathing assist Assist Level: Maximal Assistance - Patient 24 - 49%     Upper Body Dressing/Undressing Upper body dressing   What is the patient wearing?: Pull over shirt    Upper body assist Assist Level: Minimal Assistance - Patient > 75%    Lower Body Dressing/Undressing Lower body dressing      What is the patient wearing?: Underwear/pull up, Pants  Lower body assist Assist for lower body dressing: Minimal Assistance - Patient > 75%     Toileting Toileting    Toileting assist Assist for toileting: Moderate Assistance - Patient 50 - 74%     Transfers Chair/bed transfer  Transfers assist     Chair/bed transfer assist level: Contact Guard/Touching assist     Locomotion Ambulation   Ambulation assist      Assist level: Contact Guard/Touching assist Assistive device: No Device Max distance: 200   Walk 10 feet activity   Assist     Assist level: Contact Guard/Touching assist Assistive device: No Device   Walk 50 feet activity   Assist    Assist level: Contact Guard/Touching assist Assistive device: No Device    Walk 150 feet activity   Assist    Assist level: Contact Guard/Touching assist Assistive device: No Device    Walk 10 feet on  uneven surface  activity   Assist Walk 10 feet on uneven surfaces activity did not occur: Safety/medical concerns         Wheelchair     Assist Is the patient using a wheelchair?: Yes      Wheelchair assist level: Dependent - Patient 0% Max wheelchair distance: 200 ft    Wheelchair 50 feet with 2 turns activity    Assist        Assist Level: Dependent - Patient 0%   Wheelchair 150 feet activity     Assist      Assist Level: Dependent - Patient 0%   Blood pressure 112/67, pulse (!) 55, temperature 97.7 F (36.5 C), resp. rate 17, height 5' 8.5 (1.74 m), weight 59.6 kg, SpO2 94%.  Medical Problem List and Plan: 1. 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  EEG with cortical dysfunction from the right frontotemporal region, postictal state, no epileptiform discharges--was in place during another similar event.  CT head unchanged.  Neurology consulted/curbside, likely combination of postictal and medication sensitivity, increasing Lamictal  and reduce Compazine  to 5 mg dosing.  Patient monitored throughout the afternoon, improved.   2.  Antithrombotics: -DVT/anticoagulation:  Pharmaceutical: Eliquis  5mg  BID- cannot stop             -antiplatelet therapy: N/A  3. Pain Management: Tylenol  PRN.   4. Mood/Behavior/Sleep: LCSW to follow for evaluation and support.              -antipsychotic agents: N/A  -Melatonin PRN  5. Neuropsych/cognition: This patient is not fully capable of making decisions on his own behalf.  6. Skin/Wound Care:  Routine pressure relief measure.    7. Fluids/Electrolytes/Nutrition: monitor I/O. Routine labs. Cont supplements.    - 7-7 labs stable  8. T2DM: Hgb A1c-5.4 and diet controlled.  -12/26/23 CBGs fine, not on SSI, no CBG checks ordered-- will tell nursing this is not needed.   CBG (last 3)  Recent Labs    12/25/23 2132 12/26/23 0611 12/27/23 1118  GLUCAP 130* 119*  146*    9.  HTN: Monitor BP TID--avoid hypoperfusion. Continue Amlodipine  5 mg/day and propranolol  20 mg bid. 7/4-5 BP is well controlled  Vitals:   12/25/23 0523 12/25/23 0738 12/25/23 1300 12/25/23 1618  BP: 122/73 121/79 130/77 109/70   12/25/23 2137 12/26/23 0540 12/26/23 1310 12/26/23 2018  BP: 125/89 105/66 (!) 116/92 128/81   12/27/23 0300 12/27/23 1559 12/27/23 2026 12/28/23 0551  BP: 128/77 124/78 125/71 112/67    10. Dysphagia: NO straws to decrease aspiration events.  --GI consult recommended as retention of solids contribute to aspiration. Per Dr. Lizabeth note this was discussed with GI and who felt patient to be too high risk for GI procedures and recommended follow up after d/c. -SLP following  7-7: Compazine  reduce to 5 mg p.o./IV due to somnolence.  11. Seizure d/o; On Lamictal  150 mg am and 200 mg pm-->educate on compliance.  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    13. Dyslipidemia: LDL-129 and HDL 27. On Lipitor 40 mg/day.   14. Frequency/Nocturia: Gets up at nights and pt/wife agreed to toileting at 11 pm and 3 am to prevent attempts out of bed.  7/4- pt refused these scheduled get up times, but fell- asked nursing to try and get him up for nocturia  15. Spasticity - concerned about starting Any spasticity meds due to risk of sedation/confusion - suggest to be assessed for Botulinum toxin for LUE.   7-7: Minimal tone in left upper  extremity, and functional strength in limb; feel recovery would be stymied by use of Botox at this time, continue conservative management.  Monitor for possible use of WHO  16. L inattention/neglect-secondary to #1.  17. Bowels: no BM documented since 7/1 but pt states he's going daily, however history is limited d/t aphasia. Adamant that he had a BM yesterday. Asked nursing to please document.     - 7/7: Increase miralax  to BID, add sennakot S 1 tab BID    LOS: 4 days A FACE TO FACE EVALUATION WAS PERFORMED  Alan Wise 12/28/2023, 7:43 AM

## 2023-12-28 NOTE — Telephone Encounter (Signed)
 Patient's wife [name] called & left VM - states patient has now moved to inpatient rehab at [facility], she states he has had two neurological events - yesterday he collapsed to his R side & held his head, was bent over in pain.  This happened again this morning he had a severe HA on the R side & C/O nausea, his sx's are usually on the left.   Dr Buckley informed, he states he will connect with the care team.  PC to [name], informed her of this.  She is very grateful & verbalizes understanding.

## 2023-12-28 NOTE — Progress Notes (Signed)
 Patient ID: Alan Wise, male   DOB: August 20, 1970, 53 y.o.   MRN: [id number]   1150-  *SW received return phone call   Graeme Jude, MSW, LCSW Office: [phone] Cell: [phone] Fax: [phone]

## 2023-12-28 NOTE — Significant Event (Addendum)
 Rapid Response Event Note   Reason for Call :  Asked to assess by PA for somnolence  Initial Focused Assessment:  Ill appearing young male lying in bed currently undergoing EEG. Responsive to painful stimuli, MAE. Pupils sluggish. Skin cool to touch. Reportedly during therapy session, patient c/o nausea this AM as well as questionable left arm seizure activity. He then became lethargic s/p compazine  10mg  IV received approximately 0930 prompting call to Rapid.   109/72 (84) HR 53 RR 8 O2 99% RA CBG 138  Interventions/Plan of Care:  EEG CT  O2, HR remained stable during trip to CT and back. HR 50s, O2 96% Recommend reaching out to medicine and/or neuro. May need more acute monitoring if does not improve.  Event Summary:  MD Notified: MYRTIS Schmitz PA Call Time: 1024 Arrival Time: 1027 End Time: 1130  Tonna Chiquita POUR, RN

## 2023-12-28 NOTE — Progress Notes (Signed)
 Inpatient Rehabilitation  Patient information reviewed and entered into eRehab system by Feliberto Gottron, M.A., CCC-SLP, Rehab Quality Coordinator.  Information including medical coding, functional ability and quality indicators will be reviewed and updated through discharge.

## 2023-12-28 NOTE — Plan of Care (Signed)
 Discussed with Dr. Maurice via phone  Documentation reviewed  Patient with questionable seizure episode while with ST.  Per speech therapy during her session patient started heaving w/ me, but did not actually throw up. His BP was 165/91 when I left him with Ed. He was less responsive for ~5 mins and had intermittent clenching of his L hand. He is now able to follow commands and his L arm is back to baseline function   Discussed with nurse who felt that symptoms likely due to nausea. Does have issues with anxiety and frustration.  After 10 mg compazine  has been very somnolent  EEG  This study is suggestive of cortical dysfunction arising from right frontotemporal region likely secondary to underlying strokes, postictal state. No seizures or epileptiform discharges were seen throughout the recording.   Head CT report pending On my brief review, grossly stable from prior   Suspect possible breakthrough seizure vs. Frustration with lethargy now due to medication effect  Recommend reduce dose of compazine  for nausea/vomitting to 5 mg if needed in the future Recommend increase lamotrigine  to 200 mg BID  May need repeat EEG mental status not improving as compazine  is metabolized. Neurology not following actively at this time, please reach out if further concern for seizure activity, not returning to baseline, or other questions/concerns arise   These are curbside recommendations based upon the information readily available in the chart on brief review as well as history and examination information provided to me by requesting provider and do not replace a full detailed consult  Lola Jernigan MD-PhD [facility] [phone]  No charge note

## 2023-12-28 NOTE — Procedures (Signed)
 Patient Name: Alan Wise  MRN: [id number]  Epilepsy Attending: Arlin MALVA Krebs  Referring Physician/Provider: Maurice Sharlet GORMAN DEVONNA  Date: 12/28/2023 Duration: 21.50 mins  Patient history: 53yo M with h/o epilepsy getting eeg to evaluate fors eizure  Level of alertness: Awake, asleep  AEDs during EEG study: LTG  Technical aspects: This EEG study was done with scalp electrodes positioned according to the 10-20 International system of electrode placement. Electrical activity was reviewed with band pass filter of 1-70Hz , sensitivity of 7 uV/mm, display speed of 27mm/sec with a 60Hz  notched filter applied as appropriate. EEG data were recorded continuously and digitally stored.  Video monitoring was available and reviewed as appropriate.  Description: The posterior dominant rhythm consists of 8-9 Hz activity of moderate voltage (25-35 uV) seen predominantly in posterior head regions, symmetric and reactive to eye opening and eye closing. Sleep was characterized by vertex waves, sleep spindles (12 to 14 Hz), maximal frontocentral region. EEG showed continuous 3 to 6 Hz theta-delta slowing in right frontotemporal region. Hyperventilation and photic stimulation were not performed.      ABNORMALITY - Continuous slow, right frontotemporal region   IMPRESSION: This study is suggestive of cortical dysfunction arising from right frontotemporal region likely secondary to underlying strokes, postictal state. No seizures or epileptiform discharges were seen throughout the recording.   Isrrael Fluckiger O Cambell Stanek

## 2023-12-28 NOTE — Progress Notes (Signed)
 Patient with questionable seizure episode while with ST. Per speech therapy during her session patient started heaving w/ me, but did not actually throw up. His BP was 165/91 when I left him with Ed. He was less responsive for ~5 mins and had intermittent clenching of his L hand. He is now able to follow commands and his L arm is back to baseline function  Discussed with nurse who felt that symptoms likely due to nausea. Does have issues with anxiety and frustration.  EEG report with post ictal state. Discussed with Dr. Jerrie who's familiar with patient. She reported that he's extremely sensitive to medications and sedation could be a combination from likely seizure and Compazine  10 mg IV. To monitor for improvement in mentation over next couple of hours and CT head without bleed or acute changes. Increase lamictal  to 200 mg bid. Will check vitals every 30 minutes for now. Wife in room updated.

## 2023-12-28 NOTE — Progress Notes (Signed)
 Speech Language Pathology Daily Session Note  Patient Details  Name: Alan Wise MRN: [id number] Date of Birth: 1970/08/28  Today's Date: 12/28/2023 SLP Individual Time: 0830-0940 SLP Individual Time Calculation (min): 70 min  Short Term Goals: Week 1: SLP Short Term Goal 1 (Week 1): STGs = LTGs d/t ELOS  Skilled Therapeutic Interventions:   Pt and nursing greeted at bedside. He was awake upon SLP arrival, as nursing was about to administer his morning meds. SLP provided continued education re no straws. Pt became slightly frustrated w/ SLP, but was easily redirected w/ verbal encouragement and education. When taking his pills whole via liquid, he presented w/ immediate cough after final 2 pills. Anticipate esophageal dysphagia may have negatively impacted complete clearance, as liquids washes assisted to reduce coughing. He was able to answer Y to confirm sensation that pills had cleared pharyngeal space. Recommend meds whole w/ puree at this time given difficulty this AM. SLP then facilitated automatic speech tasks (phrase completion). He was completing the task w/ only s cues, though responses noted to become extremely effortful after ~5 mins and grab his head and wince in pain. He was able to answer Y/N questions re the pain w/ modA cues. He also reported some dizziness. SLP took his BP, which was [facility] @ 135/77. Pt then noted to start heaving and ambulated to the bathroom. No emesis noted, however, pt became less responsive while heaving, and required ~5 mins before he was able to follow commands and consistently respond to Y/N questions again. Nursing notified and assisted w/ vitals check. His BP was 165/91. In addition to his reduced responsiveness, his L hand was clenched intermittently (slightly flaccid @ baseline) and he demonstrated reduced motor planning as compared to baseline. SLP assisted pt to ambulate back to bed and he required minA. He appeared dazed and remained less responsive  than baseline, but was able to answer Y/N questions upon SLP departure. Pt left with nursing and his mom. SLP notified DO and PA. Recommend cont ST per POC.    Pain Pain Assessment Pain Scale: Faces Faces Pain Scale: Hurts whole lot Pain Location: Head Pain Orientation: Left  Therapy/Group: Individual Therapy  Alan Wise 12/28/2023, 11:41 AM

## 2023-12-29 DIAGNOSIS — I63521 Cerebral infarction due to unspecified occlusion or stenosis of right anterior cerebral artery: Secondary | ICD-10-CM | POA: Diagnosis not present

## 2023-12-29 MED ORDER — POLYETHYLENE GLYCOL 3350 17 G PO PACK: 17.0000 g | PACK | Freq: Every day | ORAL | Status: DC | PRN

## 2023-12-29 MED ORDER — PROCHLORPERAZINE MALEATE 5 MG PO TABS
2.5000 mg | ORAL_TABLET | Freq: Four times a day (QID) | ORAL | Status: DC | PRN
  Administered 2023-12-29 – 2023-12-31 (×2): 2.5 mg via ORAL
  Filled 2023-12-29: qty 1

## 2023-12-29 NOTE — Progress Notes (Signed)
 Occupational Therapy Session Note  Patient Details  Name: Alan Wise MRN: [id number] Date of Birth: [birth year]  Today's Date: 12/29/2023 OT Individual Time: 9262-9151 OT Individual Time Calculation (min): 71 min    Short Term Goals: Week 1:  OT Short Term Goal 1 (Week 1): STG=LTG d/t ELOS  Skilled Therapeutic Interventions/Progress Updates:    Pt received standing to void urine with RN. He had no c/o pain and requested to take a shower. Functional mobility at CGA level around the room, standing to brush teeth with close (S). He required set up of toothpaste. IV occluded for shower. He stood to remove LB clothes, appropriately holding onto wall. He was able to transfer into shower with close (S). He completed bathing with appropriate sequencing/thoroughness. He completed UB bathing with OT facilitation for the LUE to reach up under the RUE. His LUE was frequently attempting to participate and activating during bathing. He did not have significant L attention. He completed LB bathing in standing with (S). OT facilitated LUE holding onto grab bar. Following shower his IV had blood dripping from the insertion site- notified RN, who entered room and had to remove IV. He then donned shirt with (S). Underwear and pants donned seated following cueing to not stand for safety, CGA overall. He required [facility] and demonstration mixed with visual cue to sequence one handed sock donning technique. He required min A overall for the L sock. He completed grooming tasks at the sink in standing with (S). He completed functional mobility to the therapy gym, OT encouraging increased pace. He worked on LUE AROM and PROM with OT facilitating PROM to 90-100 degrees. Pt completed the BUE ergometer to challenge LUE NMR as well as strength and endurance needed to complete ADLs and IADLs with the highest level of independence. Pt completed 8 min total. Ergometer reporting heavy reliance on the RUE during BUE reciprocal  pedaling. He returned to his room. Pt was left sitting up in the recliner with all needs met, chair alarm set, and call bell within reach.   Therapy Documentation Precautions:  Precautions Precautions: Fall Recall of Precautions/Restrictions: Intact Precaution/Restrictions Comments: L visual field cut and L hemipareisis at baseline, expressive > receptive aphasia Restrictions Weight Bearing Restrictions Per Provider Order: No Other Position/Activity Restrictions: L visual field cut at baseline Therapy/Group: Individual Therapy  [name] 12/29/2023, 6:10 AM

## 2023-12-29 NOTE — Progress Notes (Signed)
 Patient ID: Alan Wise, male   DOB: [birth year], 53 y.o.   MRN: [id number]  SW met with pt and pt mother in room to provide updates from team conference, and d/c date 7/15, and outpatient therapies recommended. SW informed him on family edu tomorrow 9:30am-12pm.   1549- SW spoke with pt wife to provide updates from team conference, and above updates. She is happy about an additional week here. Preferred outpatient location is [facility]- [address] location. Confirms fam edu tomorrow.    Graeme Jude, MSW, LCSW Office: [phone] Cell: [phone] Fax: [phone]

## 2023-12-29 NOTE — Progress Notes (Signed)
 Physical Therapy Session Note  Patient Details  Name: Alan Wise MRN: [id number] Date of Birth: [birth year]  Today's Date: 12/29/2023 PT Individual Time: 1300-1350 PT Individual Time Calculation (min): 50 min  and Today's Date: 12/29/2023 PT Missed Time: 25 Minutes Missed Time Reason: Other (Comment) (nausea and vomiting)  Short Term Goals: Week 1:  PT Short Term Goal 1 (Week 1): STG = LTG d/t ELOS  Skilled Therapeutic Interventions/Progress Updates:     Pt received supine in bed and agrees to therapy. No complaint of pain. Pt performs supine to sit slowly with cues for positioning at EOB. PT provides assistance for donning shoes prior ot OOB mobility. Pt performs sit to stand with minA to facilitate anterior weight shift and sequencing. Pt ambulates x150' to gym with light minA at trunk for stability, with cues for lateral weight shifting, upright posture to improve balance, and increasing stride length to decrease risk for falls. Following extended seated rest break, pt performs standing NMR for balance and coordination, tasked with tapping foot on numbered latex circles outside of base of support. PT provides miNA at trunk throughout, and frequently provides tactile and verbal cues for correct laterality and number for target. Pt begins to indicate something to PT that this therapist has a difficulty time understanding. Pt stands and ambulates out of gym with CGA, then begins to vomit. Pt has large amount of emesis on floor while PT provides minA for safety. WC brought for safety and return to room. RN notified and PT discusses incident with pt's wife. Pt performs stand step transfer back to bed with minA. Left with all needs within reach.   Therapy Documentation Precautions:  Precautions Precautions: Fall Recall of Precautions/Restrictions: Intact Precaution/Restrictions Comments: L visual field cut and L hemipareisis at baseline, expressive > receptive aphasia Restrictions Weight  Bearing Restrictions Per Provider Order: No Other Position/Activity Restrictions: L visual field cut at baseline   Therapy/Group: Individual Therapy  [name], PT, DPT 12/29/2023, 5:10 PM

## 2023-12-29 NOTE — Progress Notes (Signed)
 Speech Language Pathology Daily Session Note  Patient Details  Name: Alan Wise MRN: [id number] Date of Birth: 1971-03-14  Today's Date: 12/29/2023 SLP Individual Time: 0902-1000 SLP Individual Time Calculation (min): 58 min  Short Term Goals: Week 1: SLP Short Term Goal 1 (Week 1): STGs = LTGs d/t ELOS  Skilled Therapeutic Interventions:  Patient was seen in am to address expressive and receptive language. Pt was alert and seated upright in recliner upon SLP arrival. Nursing in and out providing medication management. Pt able to verbalize some spontaneous utterance including; We're not going to do that right now. When asked, pt verbalized biographical information including his name, DOB, age, and wife's name intermittently throughout session while consistently warranting min to mod A. SLP further challenged pt through confrontation naming where pt warranted min to mod A for naming objects in room. SLP presented visual script for commonly used saying where pt verbalized, I'm in pain, I'm hungry, I'm thirsty, I'm tired and I need to go to the bathroom with min A. Pt then challenged in completing sentences and idioms which he completed with ~67% acc and min/mod A. At conclusion of session, pt was left upright in recliner with call button within reach and chair alarm active. SLP to continue POC.   Pain Pain Assessment Pain Scale: 0-10 Pain Score: 0-No pain  Therapy/Group: Individual Therapy  Joane GORMAN Fuss 12/29/2023, 12:28 PM

## 2023-12-29 NOTE — Progress Notes (Signed)
 PROGRESS NOTE   Subjective/Complaints:   No events overnight.  No acute complaints.  No recurrent events of lethargy/nausea.  Mild bradycardia overnight, otherwise vitals are stable.  Was on 2 L nasal cannula yesterday afternoon during acute events, no further desats since.  Ate 100% of dinner and 75% of breakfast this a.m.  No further use of Compazine . Last bowel movement this a.m., medium.  Had another large bowel movement yesterday.   ROS: limited due to aphasia--endorses no complaints, no pain.   Objective:   CT HEAD WO CONTRAST ( ) Result Date: 12/28/2023 CLINICAL DATA:  seizure, recent nonhemorrhagic infarcts and oligodendroglioma status post treatment. EXAM: CT HEAD WITHOUT CONTRAST TECHNIQUE: Contiguous axial images were obtained from the base of the skull through the vertex without intravenous contrast. RADIATION DOSE REDUCTION: This exam was performed according to the departmental dose-optimization program which includes automated exposure control, adjustment of the mA and/or kV according to patient size and/or use of iterative reconstruction technique. COMPARISON:  CT of the head dated in December 24, 2023 an MRI of the brain dated December 23, 2023. FINDINGS: Brain: Areas of dystrophic calcification are again noted within the right frontal lobe and along the medial appended mole surface of the atrium of the right lateral ventricle. There is an evolving nonhemorrhagic infarct in fall thing the right cingulate gyrus, with decreased cytotoxic edema in the interim. There is no evidence of hemorrhagic conversion. Evolving nonhemorrhagic infarcts are also noted within the right corona radiata and basal ganglia. There is no significant mass effect or midline shift. Vascular: Moderate calcific atheromatous disease. Skull: Right frontal burr craniotomy sites.  Intact otherwise. Sinuses/Orbits: Moderate mucosal disease within the ethmoid air cells  and floor of the left frontal sinus. Normal orbits. Other: Clear mastoid air cells. IMPRESSION: 1. Evolving nonhemorrhagic infarcts involving the right cingulate gyrus and right basal ganglia. 2. Stable dystrophic calcification within the right frontal lobe and periatrial white matter from previously treated neoplasm. Electronically Signed   By: [name] M.D.   On: 12/28/2023 11:46   EEG adult Result Date: 12/28/2023 [name], MD     12/28/2023 11:26 AM Patient Name: Alan Wise MRN: [id number] Epilepsy Attending: [name] Shelton Referring Physician/Provider: [name], PA-C Date: 12/28/2023 Duration: 21.50 mins Patient history: 53yo M with h/o epilepsy getting eeg to evaluate fors eizure Level of alertness: Awake, asleep AEDs during EEG study: LTG Technical aspects: This EEG study was done with scalp electrodes positioned according to the 10-20 International system of electrode placement. Electrical activity was reviewed with band pass filter of 1-70Hz , sensitivity of 7 uV/mm, display speed of 63mm/sec with a 60Hz  notched filter applied as appropriate. EEG data were recorded continuously and digitally stored.  Video monitoring was available and reviewed as appropriate. Description: The posterior dominant rhythm consists of 8-9 Hz activity of moderate voltage (25-35 uV) seen predominantly in posterior head regions, symmetric and reactive to eye opening and eye closing. Sleep was characterized by vertex waves, sleep spindles (12 to 14 Hz), maximal frontocentral region. EEG showed continuous 3 to 6 Hz theta-delta slowing in right frontotemporal region. Hyperventilation and photic stimulation were not performed.    ABNORMALITY -  Continuous slow, right frontotemporal region  IMPRESSION: This study is suggestive of cortical dysfunction arising from right frontotemporal region likely secondary to underlying strokes, postictal state. No seizures or epileptiform discharges were seen throughout the  recording.  Arlin MALVA Krebs     Recent Labs    12/28/23 0501  WBC 7.4  HGB 14.9  HCT 43.9  PLT 202   Recent Labs    12/28/23 0501  NA 140  K 3.9  CL 104  CO2 27  GLUCOSE 99  BUN 17  CREATININE 0.88  CALCIUM  9.5    Intake/Output Summary (Last 24 hours) at 12/29/2023 0925 Last data filed at 12/29/2023 9076 Gross per 24 hour  Intake 1110.25 ml  Output --  Net 1110.25 ml        Physical Exam: Vital Signs Blood pressure 132/86, pulse 77, temperature 98.2 F (36.8 C), temperature source Oral, resp. rate 18, height 5' 8.5 (1.74 m), weight 59.6 kg, SpO2 97%.    General: awake, alert, calm; NAD, Sitting up in HENT: conjugate gaze; oropharynx moist.+ L visual field cut CV: regular rate and rhythm; no JVD Pulmonary: CTA B/L; no W/R/R- good air movement GI: soft, NT, ND, (+)BS. Psychiatric: calm-cooperative with exam.   Neurological:  Awake, alert. Appropriate responses to commands. Severe expressive aphasia, moderate receptive deficits, can occasionally answer yes/no appropriately.    Left hemineglect   Strength: RUE-5 out of 5 throughout LUE-3-\/5 shoulder abduction, biceps 4/5; triceps 4+/5; WE 4-/5; grip 4/5; FA 3+/5 RLE- 5-/5 throughout LLE- 4+/5 throughout Decree sensation to light touch throughout the left hemibody Cranial nerves: Left facial droop  Tone: MAS 1+ left elbow, shoulder, MAS 1 wrist/fingers   Assessment/Plan: 1. Functional deficits which require 3+ hours per day of interdisciplinary therapy in a comprehensive inpatient rehab setting. Physiatrist is providing close team supervision and 24 hour management of active medical problems listed below. Physiatrist and rehab team continue to assess barriers to discharge/monitor patient progress toward functional and medical goals  Care Tool:  Bathing    Body parts bathed by patient: Right arm, Left arm, Chest, Abdomen, Front perineal area, Right upper leg, Left upper leg, Right lower leg, Left  lower leg, Face   Body parts bathed by helper: Buttocks, Right arm     Bathing assist Assist Level: Maximal Assistance - Patient 24 - 49%     Upper Body Dressing/Undressing Upper body dressing   What is the patient wearing?: Pull over shirt    Upper body assist Assist Level: Minimal Assistance - Patient > 75%    Lower Body Dressing/Undressing Lower body dressing      What is the patient wearing?: Underwear/pull up, Pants     Lower body assist Assist for lower body dressing: Minimal Assistance - Patient > 75%     Toileting Toileting    Toileting assist Assist for toileting: Moderate Assistance - Patient 50 - 74%     Transfers Chair/bed transfer  Transfers assist     Chair/bed transfer assist level: Contact Guard/Touching assist     Locomotion Ambulation   Ambulation assist      Assist level: Contact Guard/Touching assist Assistive device: No Device Max distance: 200   Walk 10 feet activity   Assist     Assist level: Contact Guard/Touching assist Assistive device: No Device   Walk 50 feet activity   Assist    Assist level: Contact Guard/Touching assist Assistive device: No Device    Walk 150 feet activity   Assist  Assist level: Contact Guard/Touching assist Assistive device: No Device    Walk 10 feet on uneven surface  activity   Assist Walk 10 feet on uneven surfaces activity did not occur: Safety/medical concerns         Wheelchair     Assist Is the patient using a wheelchair?: Yes      Wheelchair assist level: Dependent - Patient 0% Max wheelchair distance: 200 ft    Wheelchair 50 feet with 2 turns activity    Assist        Assist Level: Dependent - Patient 0%   Wheelchair 150 feet activity     Assist      Assist Level: Dependent - Patient 0%   Blood pressure 132/86, pulse 77, temperature 98.2 F (36.8 C), temperature source Oral, resp. rate 18, height 5' 8.5 (1.74 m), weight 59.6 kg, SpO2  97%.  Medical Problem List and Plan: 1. 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 PT improving. SLP appears similar to last admission - per SLP was making progress in OP prior to this - very limited frustration tolerance.   -7/7: Episode of extreme lethargy with left upper extremity movements concerning for seizure after receiving 10 mg of IV Compazine .  EEG with cortical dysfunction from the right frontotemporal region, postictal state, no epileptiform discharges--was in place during another similar event.  CT head unchanged.  Neurology consulted/curbside, likely combination of postictal and medication sensitivity, increasing Lamictal  and reduce Compazine  to 5 mg dosing.  Patient monitored throughout the afternoon, improved.  2.  Antithrombotics: -DVT/anticoagulation:  Pharmaceutical: Eliquis  5mg  BID- cannot stop             -antiplatelet therapy: N/A  3. Pain Management: Tylenol  PRN.   4. Mood/Behavior/Sleep: LCSW to follow for evaluation and support.              -antipsychotic agents: N/A  -Melatonin PRN   -Reports last IRF admission mom encouraged him to stop his seizure medication. No issues this admission, will watch closely.   5. Neuropsych/cognition: This patient is not fully capable of making decisions on his own behalf.  6. Skin/Wound Care: Routine pressure relief measure.    7. Fluids/Electrolytes/Nutrition: monitor I/O. Routine labs. Cont supplements.    - 7-7 labs stable; placed on 100 mL/h's continuous IV fluids for 24 hours due to nausea/somnolence  8. T2DM: Hgb A1c-5.4 and diet controlled.  -12/26/23 CBGs fine, not on SSI, no CBG checks ordered-- will tell nursing this is not needed.     9.  HTN: Monitor BP TID--avoid hypoperfusion. Continue Amlodipine  5 mg/day and propranolol  20 mg bid. Blood pressure well-controlled on current regimen Vitals:   12/27/23 0300 12/27/23 1559 12/27/23 2026 12/28/23 0551  BP: 128/77 124/78 125/71 112/67   12/28/23 0840 12/28/23 0929 12/28/23 1030 12/28/23 1200  BP: 114/68 (!) 165/91 109/72 126/81   12/28/23 1300 12/28/23 1439 12/28/23 2002 12/29/23 0524  BP: 117/71 124/86 109/81 132/86    10. Dysphagia: NO straws to decrease aspiration events.  --GI consult recommended as retention of solids contribute to aspiration. Per Dr. Lizabeth  note this was discussed with GI and who felt patient to be too high risk for GI procedures and recommended follow up after d/c. -SLP following  7-7: Compazine  reduce to 5 mg p.o./IV due to somnolence.  11. Seizure d/o; On Lamictal  150 mg am and 200 mg pm-->educate on compliance.  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    13. Dyslipidemia: LDL-129 and HDL 27. On Lipitor 40 mg/day.   14. Frequency/Nocturia: Gets up at nights and pt/wife agreed to toileting at 11 pm and 3 am to prevent attempts out of bed.  7/4- pt refused these scheduled get up times, but fell- asked nursing to try and get him up for nocturia  15. Spasticity - concerned about starting Any spasticity meds due to risk of sedation/confusion - suggest to be assessed for Botulinum toxin for LUE.   7-7: Minimal tone in left upper extremity, and functional strength in limb; feel recovery would be stymied by use of Botox at this time, continue conservative management.  Monitor for possible use of WHO  16. L inattention/neglect-secondary to #1.  17. Bowels: no BM documented since 7/1 but pt states he's going daily, however  history is limited d/t aphasia. Adamant that he had a BM yesterday. Asked nursing to please document.     - 7/7: Increase miralax  to BID, add sennakot S 1 tab BID   7-8: 2x bowel movements yesterday, reduce MiraLAX  to daily as needed  LOS: 5 days A FACE TO FACE EVALUATION WAS PERFORMED  Alan Wise 12/29/2023, 9:25 AM

## 2023-12-29 NOTE — Progress Notes (Signed)
 Patient has large emesis episode while ambulating with PT at approx 1330. Provider notified and ginger ale given as suggested rather than compazine . Compazine  eventually given approx 1430, patient reports relief

## 2023-12-30 ENCOUNTER — Ambulatory Visit

## 2023-12-30 ENCOUNTER — Encounter: Admitting: Speech Pathology

## 2023-12-30 DIAGNOSIS — I63521 Cerebral infarction due to unspecified occlusion or stenosis of right anterior cerebral artery: Secondary | ICD-10-CM | POA: Diagnosis not present

## 2023-12-30 DIAGNOSIS — R4701 Aphasia: Secondary | ICD-10-CM

## 2023-12-30 DIAGNOSIS — I639 Cerebral infarction, unspecified: Secondary | ICD-10-CM

## 2023-12-30 NOTE — Patient Care Conference (Signed)
 Inpatient RehabilitationTeam Conference and Plan of Care Update Date: 12/29/2023   Time: 10:11 AM    Patient Name: Alan Wise      Medical Record Number: [id number]  Date of Birth: 1971-04-23 Sex: Male         Room/Bed: 4M04C/4M04C-01 Payor Info: Payor: [employer] / Plan: [facility] / Product Type: *No Product type* /    Admit Date/Time:  12/24/2023  2:54 PM  Primary Diagnosis:  Acute right ACA stroke [facility])  Hospital Problems: Principal Problem:   Acute right ACA stroke [facility])    Expected Discharge Date: Expected Discharge Date: 01/05/24  Team Members Present: Physician leading conference: Dr. Joesph Likes Social Worker Present: Graeme Jude, LCSW Nurse Present: Barnie Ronde, RN PT Present: Kirt Dawn, PT OT Present: Nena Moats, OT SLP Present: Recardo Mole, SLP     Current Status/Progress Goal Weekly Team Focus  Bowel/Bladder      Continent of bowel and bladder          Swallow/Nutrition/ Hydration      Dehydrated; NS at  100cc/hr overnight   IVF discontinued    MD to recheck labs  ADL's   Min A UB bathing to integrate the LUE, (S) to don shirt, Min A LB dressing for socks only, CGA transfers and mobility. Barriers include fall risk, L visual deficits   Supervision   L-sided NMR, caregiver education, discharge planning, ADLs, transfers    Mobility   supervision to CGA all mobility, minA for occasional slight LOBs with ambulation   Supervision  high level balance, ambulation, endurance, family ed    Communication   severe expressive and moderate receptive deficits overall   modA comprehension, maxA expression   pt/family education, functional language tasks    Safety/Cognition/ Behavioral Observations  participation limited by behaviors, limited frustration tolerance, L visual attention deficits   modA   pt/family education, cognitive re training    Pain     Nausea 12/28/23    Nausea resolved     Nausea treated; sensitive to medications   Skin      N/A            Discharge Planning:  Pt will d/c to home with his wife. Fam edu Wed 9:30am-12pm. SW will confirm there are no barriers to discharge.   Team Discussion: Patient post right ACA CVA with  left hemi-neglect UE and visual deficits with a history of seizures. Episode of vagal vs seizure 12/28/23; RRT called; EEG (-) seizure but post ictal. Patient has severe expressive aphasia and moderate receptive aphasia and limited by low frustration tolerance.  Patient on target to meet rehab goals: yes, currently needs supervision - CGA for mobility and ambulates without an assistive device. Needs min assist for bathing and lower body care/don socks. Goals for discharge set for supervision overall with mod assist for cognition.  *See Care Plan and progress notes for long and short-term goals.   Revisions to Treatment Plan:  N/A   Teaching Needs: Safety, medications, transfers, toileting, etc.   Current Barriers to Discharge: Decreased caregiver support  Possible Resolutions to Barriers: Family education OP follow up services     Medical Summary Current Status: Medically complicated by CVA with left hemiparesis, headache, spasticity, nausea, oligodendroglioma, seizures, hypertension, nocturia, and constipation  Barriers to Discharge: Behavior/Mood;Medical stability;Pending chemo/radiation;Self-care education;Uncontrolled Pain   Possible Resolutions to [employer]: Increase antiepileptics and monitor for seizure-like episodes, monitor p.o. intakes and encourage fluids/provide supplementation as needed, monitor vitals and adjust BP  meds as necessary, coordinate with neurology and neuro-oncology on patient care   Continued Need for Acute Rehabilitation Level of Care: The patient requires daily medical management by a physician with specialized training in physical medicine and rehabilitation for the following reasons: Direction of a multidisciplinary physical  rehabilitation program to maximize functional independence : Yes Medical management of patient stability for increased activity during participation in an intensive rehabilitation regime.: Yes Analysis of laboratory values and/or radiology reports with any subsequent need for medication adjustment and/or medical intervention. : Yes   I attest that I was present, lead the team conference, and concur with the assessment and plan of the team.   Fredericka Sober B 12/30/2023, 8:26 AM

## 2023-12-30 NOTE — Progress Notes (Signed)
 Occupational Therapy Session Note  Patient Details  Name: Alan Wise MRN: [id number] Date of Birth: October 16, 1970  Today's Date: 12/30/2023 OT Individual Time: 1045-1140 OT Individual Time Calculation (min): 55 min    Short Term Goals: Week 1:  OT Short Term Goal 1 (Week 1): STG=LTG d/t ELOS  Skilled Therapeutic Interventions/Progress Updates:    Pt received in w/c calling out to use the bathroom. No c/o pain. He completed functional mobility into the bathroom with CGA. He completed clothing management and sat to pee with close (S). He was then able to complete functional mobility to the therapy gym, 200 ft, with close (S)- CGA. Cueing for relaxing L UE flexor posture, which he was able to do- not fully straight but going from 90 degrees to 45. Worked on grasp/release of L hand with functional reaching motor patterns to reduce flexor synergy and tone. He required mod facilitation to open hands, especially with intention flexor tone in the hand much worse. He was able to grasp and pull off multiple squigz while standing at the window. He then completed reciprocal crawling on the mat to address LUE weightbearing in extension and dynamic BUE/BLE coordination for carryover to increased independence with ADLs/transfers. Pt presenting with significant L scapula medial winging so worked on serratus anterior activation in supine to address scapular stability. Difficulty motor planning this movement. He then transitioned into sidelying and OT provided scapular glides to address tightness along scapula, especially in retraction and upward/downward rotation. He returned to his room following and his wife was instructed in PROM of his LUE to maintain muscle length and reduce contracture risk. Pt passed off to SLP in room.   Therapy Documentation Precautions:  Precautions Precautions: Fall Recall of Precautions/Restrictions: Intact Precaution/Restrictions Comments: L visual field cut and L hemipareisis at  baseline, expressive > receptive aphasia Restrictions Weight Bearing Restrictions Per Provider Order: No Other Position/Activity Restrictions: L visual field cut at baseline  Therapy/Group: Individual Therapy  [name] 12/30/2023, 9:41 AM

## 2023-12-30 NOTE — Plan of Care (Signed)
  Problem: RH BLADDER ELIMINATION Goal: RH STG MANAGE BLADDER WITH ASSISTANCE Description: STG Manage Bladder With toileting Assistance Outcome: Progressing   Problem: RH SAFETY Goal: RH STG ADHERE TO SAFETY PRECAUTIONS W/ASSISTANCE/DEVICE Description: STG Adhere to Safety Precautions With cues Assistance/Device. Outcome: Progressing   Problem: RH KNOWLEDGE DEFICIT GENERAL Goal: RH STG INCREASE KNOWLEDGE OF SELF CARE AFTER HOSPITALIZATION Description: Patient and wife will be able to manage care at discharge using educational resources for medications and dietary modification independently Outcome: Progressing   Problem: Consults Goal: RH STROKE PATIENT EDUCATION Description: See Patient Education module for education specifics  Outcome: Progressing

## 2023-12-30 NOTE — Progress Notes (Signed)
 Speech Language Pathology Daily Session Note  Patient Details  Name: Alan Wise MRN: [id number] Date of Birth: 1971/03/02  Today's Date: 12/30/2023 SLP Individual Time: 1140-1210 SLP Individual Time Calculation (min): 30 min  Short Term Goals: Week 1: SLP Short Term Goal 1 (Week 1): STGs = LTGs d/t ELOS  Skilled Therapeutic Interventions:   Direct handoff completed w/ OT. His wife was present in the room for family education. SLP facilitated tx tasks targeting language. During conversation re upcoming d/c and previous OP ST, he was able to answer simple Y/N questions w/ supervisionA and complex Y/N questions w/ modA. He then completed a task unscrambling automatic sequences (1-20 and dow). Anticipate L visual inattention negatively impacted success, however, pt required modA overall for sequencing and error awareness. He also required minA to verbalize the sequences. Pt required a notable amount of time to complete the task. Pt appeared hesitant to have his wife participate in family education, however, SLP was able to passively engage her in session. Also discussed taking his meds w/ liquids vs puree. He and his wife verbalized understanding of education re original MBSS recommendations and complications given esophageal dysphagia. Recommend meds whole w/ liquids. Additional questions from the wife were answered re safe swallow precautions and language tasks for the home environment. At the end of tx tasks, he was assisted back to bed. He required s cues for sequencing and safety awareness. He was left in bed with the alarm set and call light within reach. Recommend cont ST per POC.   Pain  No pain reported   Therapy/Group: Individual Therapy  Alan Wise 12/30/2023, 1:24 PM

## 2023-12-30 NOTE — Consult Note (Signed)
  12/30/2023: 2 PM-2:30 PM  Today I had the beginnings of a clinical interview with the patient.  The patient has had a recent CVA with global expressive aphasia.  The patient's has severe expressive language deficits and [place].  He had adequate receptive language abilities this was very difficult if not impossible to adequately assess.  The patient attempted to speak in failed to attempt to use any other means of communication even when attempts and suggestions were made.  The patient indicated to being very frustrated with his inability to adequately express himself.  This frustration was clear during the visit and it appeared that I was only increasing his amount of frustration rather than being able to provide any particular benefit for the visit.  It was discontinued early due to increasing frustration although his frustration was for the most part appropriate given his lack of expressive language capacity.  I will try to visit with the patient again later this week

## 2023-12-30 NOTE — Progress Notes (Addendum)
 PROGRESS NOTE   Subjective/Complaints:  2 episodes of emesis yesterday afternoon, improved with p.o. Compazine  2.5 mg.  Discussed with patient this a.m., he endorses ongoing intermittent nausea, not associated with position, medication, food intakes, or time of day.  He states he is feeling well this morning.  Briefly discussed possible medication adjustments, however patient states he does not want meds changed. Vitals remained stable, mild bradycardia in the high 50s overnight.  Understanding Limited by expressive aphasia, however patient expresses some level of offense that his mother experienced this morning when nursing was in the room.  This caused her to leave the room prior to family training.  Patient endorses that he is not concerned or offended.  Denies any wants or needs at this time.  ROS: limited due to aphasia  Objective:   CT HEAD WO CONTRAST ( ) Result Date: 12/28/2023 CLINICAL DATA:  seizure, recent nonhemorrhagic infarcts and oligodendroglioma status post treatment. EXAM: CT HEAD WITHOUT CONTRAST TECHNIQUE: Contiguous axial images were obtained from the base of the skull through the vertex without intravenous contrast. RADIATION DOSE REDUCTION: This exam was performed according to the departmental dose-optimization program which includes automated exposure control, adjustment of the mA and/or kV according to patient size and/or use of iterative reconstruction technique. COMPARISON:  CT of the head dated in December 24, 2023 an MRI of the brain dated December 23, 2023. FINDINGS: Brain: Areas of dystrophic calcification are again noted within the right frontal lobe and along the medial appended mole surface of the atrium of the right lateral ventricle. There is an evolving nonhemorrhagic infarct in fall thing the right cingulate gyrus, with decreased cytotoxic edema in the interim. There is no evidence of hemorrhagic conversion. Evolving  nonhemorrhagic infarcts are also noted within the right corona radiata and basal ganglia. There is no significant mass effect or midline shift. Vascular: Moderate calcific atheromatous disease. Skull: Right frontal burr craniotomy sites.  Intact otherwise. Sinuses/Orbits: Moderate mucosal disease within the ethmoid air cells and floor of the left frontal sinus. Normal orbits. Other: Clear mastoid air cells. IMPRESSION: 1. Evolving nonhemorrhagic infarcts involving the right cingulate gyrus and right basal ganglia. 2. Stable dystrophic calcification within the right frontal lobe and periatrial white matter from previously treated neoplasm. Electronically Signed   By: [name] M.D.   On: 12/28/2023 11:46   EEG adult Result Date: 12/28/2023 Alan [name], MD     12/28/2023 11:26 AM Patient Name: Alan Wise MRN: [id number] Epilepsy Attending: Arlin Wise Alan Referring Physician/Provider: [name], PA-C Date: 12/28/2023 Duration: 21.50 mins Patient history: 53yo M with h/o epilepsy getting eeg to evaluate fors eizure Level of alertness: Awake, asleep AEDs during EEG study: LTG Technical aspects: This EEG study was done with scalp electrodes positioned according to the 10-20 International system of electrode placement. Electrical activity was reviewed with band pass filter of 1-70Hz , sensitivity of 7 uV/mm, display speed of 31mm/sec with a 60Hz  notched filter applied as appropriate. EEG data were recorded continuously and digitally stored.  Video monitoring was available and reviewed as appropriate. Description: The posterior dominant rhythm consists of 8-9 Hz activity of moderate voltage (25-35 uV) seen predominantly in posterior  head regions, symmetric and reactive to eye opening and eye closing. Sleep was characterized by vertex waves, sleep spindles (12 to 14 Hz), maximal frontocentral region. EEG showed continuous 3 to 6 Hz theta-delta slowing in right frontotemporal region. Hyperventilation  and photic stimulation were not performed.    ABNORMALITY - Continuous slow, right frontotemporal region  IMPRESSION: This study is suggestive of cortical dysfunction arising from right frontotemporal region likely secondary to underlying strokes, postictal state. No seizures or epileptiform discharges were seen throughout the recording.  Arlin MALVA Krebs     Recent Labs    12/28/23 0501  WBC 7.4  HGB 14.9  HCT 43.9  PLT 202   Recent Labs    12/28/23 0501  NA 140  K 3.9  CL 104  CO2 27  GLUCOSE 99  BUN 17  CREATININE 0.88  CALCIUM  9.5    Intake/Output Summary (Last 24 hours) at 12/30/2023 0826 Last data filed at 12/30/2023 0818 Gross per 24 hour  Intake 957 ml  Output --  Net 957 ml        Physical Exam: Vital Signs Blood pressure 133/68, pulse (!) 57, temperature 97.6 F (36.4 C), temperature source Oral, resp. rate 18, height 5' 8.5 (1.74 m), weight 59.6 kg, SpO2 99%.    General: awake, alert, calm; NAD, Sitting up in bedside chair. HENT: conjugate gaze; oropharynx moist.+ L visual field cut; unchanged CV: regular rate and rhythm; no JVD Pulmonary: CTA B/L; no W/R/R- good air movement GI: soft, NT, ND, (+)BS. Psychiatric: calm-cooperative with exam.  Somewhat frustrated with expressive aphasia.   Neurological:  Awake, alert. Appropriate responses to commands. Severe expressive aphasia, moderate receptive deficits, can occasionally answer yes/no appropriately.    Left hemineglect --can intermittently attend to left side with stimulation  Strength: RUE-5 out of 5 throughout LUE-3-\/5 shoulder abduction, biceps 4/5; triceps 4+/5; WE 4-/5; grip 4/5; FA 3+/5 RLE- 5-/5 throughout LLE- 4+/5 throughout Decree sensation to light touch throughout the left hemibody Cranial nerves: Left facial droop  Tone: No appreciable tone in left upper extremity; flexor synergy pattern   Assessment/Plan: 1. Functional deficits which require 3+ hours per day of interdisciplinary  therapy in a comprehensive inpatient rehab setting. Physiatrist is providing close team supervision and 24 hour management of active medical problems listed below. Physiatrist and rehab team continue to assess barriers to discharge/monitor patient progress toward functional and medical goals  Care Tool:  Bathing    Body parts bathed by patient: Right arm, Left arm, Chest, Abdomen, Front perineal area, Right upper leg, Left upper leg, Right lower leg, Left lower leg, Face   Body parts bathed by helper: Buttocks, Right arm     Bathing assist Assist Level: Maximal Assistance - Patient 24 - 49%     Upper Body Dressing/Undressing Upper body dressing   What is the patient wearing?: Pull over shirt    Upper body assist Assist Level: Minimal Assistance - Patient > 75%    Lower Body Dressing/Undressing Lower body dressing      What is the patient wearing?: Underwear/pull up, Pants     Lower body assist Assist for lower body dressing: Minimal Assistance - Patient > 75%     Toileting Toileting    Toileting assist Assist for toileting: Moderate Assistance - Patient 50 - 74%     Transfers Chair/bed transfer  Transfers assist     Chair/bed transfer assist level: Contact Guard/Touching assist     Locomotion Ambulation   Ambulation assist  Assist level: Contact Guard/Touching assist Assistive device: No Device Max distance: 200   Walk 10 feet activity   Assist     Assist level: Contact Guard/Touching assist Assistive device: No Device   Walk 50 feet activity   Assist    Assist level: Contact Guard/Touching assist Assistive device: No Device    Walk 150 feet activity   Assist    Assist level: Contact Guard/Touching assist Assistive device: No Device    Walk 10 feet on uneven surface  activity   Assist Walk 10 feet on uneven surfaces activity did not occur: Safety/medical concerns         Wheelchair     Assist Is the patient using  a wheelchair?: Yes      Wheelchair assist level: Dependent - Patient 0% Max wheelchair distance: 200 ft    Wheelchair 50 feet with 2 turns activity    Assist        Assist Level: Dependent - Patient 0%   Wheelchair 150 feet activity     Assist      Assist Level: Dependent - Patient 0%   Blood pressure 133/68, pulse (!) 57, temperature 97.6 F (36.4 C), temperature source Oral, resp. rate 18, height 5' 8.5 (1.74 m), weight 59.6 kg, SpO2 99%.  Medical Problem List and Plan: 1. 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 PT improving. SLP appears similar to last admission - per SLP was making progress in OP prior to this - very limited frustration tolerance.   -7/7: Episode of extreme lethargy with left upper extremity movements concerning for seizure after receiving 10 mg of IV Compazine .  EEG with cortical dysfunction from the right frontotemporal region, postictal state, no epileptiform discharges--was in place during another similar event.  CT head unchanged.  Neurology consulted/curbside, likely combination of postictal and medication sensitivity, increasing Lamictal  and reduce Compazine  to 5 mg dosing.  Patient monitored throughout the afternoon, improved.  7-9: Will complete family training remotely due to difficulty coordinating family; discussed events this a.m. where patient's mother was offended, uncertain cause, no further incidents reported through the day.  2.  Antithrombotics: -DVT/anticoagulation:  Pharmaceutical: Eliquis  5mg  BID- cannot stop             -antiplatelet therapy: N/A  3. Pain Management: Tylenol  PRN.   4. Mood/Behavior/Sleep: LCSW to follow for evaluation  and support.              -antipsychotic agents: N/A  -Melatonin PRN   -Reports last IRF admission mom encouraged him to stop his seizure medication. No issues this admission, will watch closely.   5. Neuropsych/cognition: This patient is not fully capable of making decisions on his own behalf.  6. Skin/Wound Care: Routine pressure relief measure.    7. Fluids/Electrolytes/Nutrition: monitor I/O. Routine labs. Cont supplements.    - 7-7 labs stable; placed on 100 mL/h's continuous IV fluids for 24 hours due to nausea/somnolence--DC'd 7-8  Labs Thursday  8. T2DM: Hgb A1c-5.4 and diet controlled.  -12/26/23 CBGs fine, not on SSI, no CBG checks ordered-- will tell nursing this  is not needed.    9.  HTN: Monitor BP TID--avoid hypoperfusion. Continue Amlodipine  5 mg/day and propranolol  20 mg bid. Blood pressure well-controlled on current regimen; some highs at 7-9, but will trend Vitals:   12/28/23 0840 12/28/23 0929 12/28/23 1030 12/28/23 1200  BP: 114/68 (!) 165/91 109/72 126/81   12/28/23 1300 12/28/23 1439 12/28/23 2002 12/29/23 0524  BP: 117/71 124/86 109/81 132/86   12/29/23 1348 12/29/23 2055 12/29/23 2056 12/30/23 0347  BP: (!) 149/80 (!) 151/79 (!) 151/79 133/68    10. Dysphagia: NO straws to decrease aspiration events.  --GI consult recommended as retention of solids contribute to aspiration. Per Dr. Lizabeth note this was discussed with GI and who felt patient to be too high risk for GI procedures and recommended follow up after d/c. -SLP following  7-7: Compazine  reduce to 5 mg p.o./IV due to somnolence.-->  2.5 mg p.o. as needed, with benefit 7-8  11. Seizure d/o; On Lamictal  150 mg am and 200 mg pm-->educate on compliance.  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 Buckley  after discharge.    13. Dyslipidemia: LDL-129 and HDL 27. On Lipitor 40 mg/day.   14. Frequency/Nocturia: Gets up at nights and pt/wife agreed to toileting at 11 pm and 3 am to prevent attempts out of bed.  7/4- pt refused these scheduled get up times, but fell- asked nursing to try and get him up for nocturia Continent of bladder  15. Spasticity - concerned about starting Any spasticity meds due to risk of sedation/confusion - suggest to be assessed for Botulinum toxin for LUE.   7-7: Minimal tone in left upper extremity, and functional strength in limb; feel recovery would be stymied by use of Botox at this time, continue conservative management.  Monitor for possible use of WHO  16. L inattention/neglect-secondary to #1.  17. Bowels: no BM documented since 7/1 but pt states he's going daily, however history is limited d/t aphasia. Adamant that he had a BM yesterday. Asked nursing to please document.     - 7/7: Increase miralax  to BID, add sennakot S 1 tab BID   7-8: 2x bowel movements yesterday, reduce MiraLAX  to daily as needed  LOS: 6 days A FACE TO FACE EVALUATION WAS PERFORMED  Alan Wise 12/30/2023, 8:26 AM

## 2023-12-30 NOTE — Progress Notes (Signed)
 Physical Therapy Session Note  Patient Details  Name: Alan Wise MRN: [id number] Date of Birth: 08/31/1970  Today's Date: 12/30/2023 PT Individual Time: 0950-1045 PT Individual Time Calculation (min): 55 min   Today's Date: 12/30/2023 PT Individual Time: 1520-1630 PT Individual Time Calculation (min): 70 min   Short Term Goals: Week 1:  PT Short Term Goal 1 (Week 1): STG = LTG d/t ELOS  Skilled Therapeutic Interventions/Progress Updates:     1st Session: Pt received seated in recliner and agrees to therapy. No complaint of pain. Pt performs sit to stand with CGA and cues for initiation, then ambulates to [facility] with similar cues and assistnace. WC transport to gym for time management. Pt stands with CGA and cues for body mechanics, then ambulates x350' with CGA/minA, with light facilitation of Rt lateral weight shifting to promote improved Lt foot clearance during swing phase, along with verbal cueing. Pt indicates need to use restroom. Pt performs ambulatory transfer to toilet with CGA. Pt has continent BM in toilet, and PT performs pericare for optimal hygiene. Pt cued to perform handwashing at sink and use of hand sanitizer, then transfers back to [facility] with cues for sequencing. Seated rest break. PT places 4lb ankle weight on LLE to provide increased NM feedback and pt ambulates additional x175' with consistent minA to promote Rt lateral weight shifting for LLE clearance. WC transport back to room. Pt left seated in WC with all needs within reach.   2nd Session: Pt received seated in recliner and agrees to therapy. No complaint of pain. Pt performs sit to stand with cues for initiation. Pt ambulates x300' without AD, requiring modA during turn out of door due to LOB, and then minA for remainder of distance to promote lateral weight shifting. Pt provided with bowl of ice cream and pt tasked with ambulating while holding onto bowl with RUE for multitasking challenge. Following rest break, pt  completes cornhole activity, tasked with reaching for bags with LUE, passing bag to RUE, then tossing at target. Activity emphasis is on LUE coordination, standing balance, and endurance. PT provides assistance for reaching task with LUE but pt is able to maintain balance without physical assistance. After seated rest break, pt continues cornhole activity and while attempting to grab bag has complete LOB, requiring maxA to guide pt back to mat to prevent fall. Pt then transitions to Nustep and completes x8:00 at workload of 5 with average steps per minute ~40 for reciprocal coordination training. PT provides cues for hand and foot placement and completing full available ROM. Pt ambulates back to room with minA and same cueing. Left supine in bed with all needs within reach.   Therapy Documentation Precautions:  Precautions Precautions: Fall Recall of Precautions/Restrictions: Intact Precaution/Restrictions Comments: L visual field cut and L hemipareisis at baseline, expressive > receptive aphasia Restrictions Weight Bearing Restrictions Per Provider Order: No Other Position/Activity Restrictions: L visual field cut at baseline   Therapy/Group: Individual Therapy  [name], PT, DPT 12/30/2023, 4:36 PM

## 2023-12-31 ENCOUNTER — Ambulatory Visit

## 2023-12-31 ENCOUNTER — Encounter: Admitting: Speech Pathology

## 2023-12-31 LAB — CBC
HCT: 44.3 % (ref 39.0–52.0)
Hemoglobin: 15 g/dL (ref 13.0–17.0)
MCH: 29.2 pg (ref 26.0–34.0)
MCHC: 33.9 g/dL (ref 30.0–36.0)
MCV: 86.2 fL (ref 80.0–100.0)
Platelets: 173 K/uL (ref 150–400)
RBC: 5.14 MIL/uL (ref 4.22–5.81)
RDW: 12.6 % (ref 11.5–15.5)
WBC: 5.8 K/uL (ref 4.0–10.5)
nRBC: 0 % (ref 0.0–0.2)

## 2023-12-31 LAB — BASIC METABOLIC PANEL WITH GFR
Anion gap: 9 (ref 5–15)
BUN: 11 mg/dL (ref 6–20)
CO2: 27 mmol/L (ref 22–32)
Calcium: 9.6 mg/dL (ref 8.9–10.3)
Chloride: 101 mmol/L (ref 98–111)
Creatinine, Ser: 0.96 mg/dL (ref 0.61–1.24)
GFR, Estimated: >60 mL/min (ref >60)
Glucose, Bld: 88 mg/dL (ref 70–99)
Potassium: 3.9 mmol/L (ref 3.5–5.1)
Sodium: 137 mmol/L (ref 135–145)

## 2023-12-31 MED ORDER — ONDANSETRON 4 MG PO TBDP: 8.0000 mg | ORAL_TABLET | Freq: Three times a day (TID) | ORAL | Status: DC | PRN

## 2023-12-31 MED ORDER — SODIUM CHLORIDE 0.9 % IV SOLN
8.0000 mg | Freq: Three times a day (TID) | INTRAVENOUS | Status: DC
  Administered 2024-01-01: 8 mg via INTRAVENOUS
  Filled 2023-12-31 (×11): qty 4

## 2023-12-31 MED ORDER — SODIUM CHLORIDE 0.9 % IV SOLN: INTRAVENOUS | Status: DC

## 2023-12-31 MED ORDER — DEXAMETHASONE 0.5 MG PO TABS
1.0000 mg | ORAL_TABLET | Freq: Every day | ORAL | Status: AC
  Administered 2023-12-31 – 2024-01-02 (×3): 1 mg via ORAL
  Filled 2023-12-31 (×3): qty 2

## 2023-12-31 MED ORDER — MECLIZINE HCL 25 MG PO TABS
12.5000 mg | ORAL_TABLET | Freq: Two times a day (BID) | ORAL | Status: DC | PRN
  Administered 2023-12-31: 12.5 mg via ORAL
  Filled 2023-12-31: qty 1

## 2023-12-31 MED ORDER — PROCHLORPERAZINE MALEATE 5 MG PO TABS
2.5000 mg | ORAL_TABLET | Freq: Three times a day (TID) | ORAL | Status: DC | PRN
  Administered 2024-01-05: 2.5 mg via ORAL
  Filled 2023-12-31 (×2): qty 1

## 2023-12-31 MED ORDER — PANTOPRAZOLE SODIUM 40 MG PO TBEC
40.0000 mg | DELAYED_RELEASE_TABLET | Freq: Two times a day (BID) | ORAL | Status: DC
  Administered 2023-12-31 – 2024-01-11 (×21): 40 mg via ORAL
  Filled 2023-12-31 (×20): qty 1

## 2023-12-31 MED ORDER — ONDANSETRON 4 MG PO TBDP
8.0000 mg | ORAL_TABLET | Freq: Three times a day (TID) | ORAL | Status: DC
  Administered 2023-12-31 – 2024-01-11 (×32): 8 mg via ORAL
  Filled 2023-12-31 (×30): qty 2

## 2023-12-31 MED ORDER — PREDNISONE 1 MG PO TABS: 1.0000 mg | ORAL_TABLET | Freq: Every day | ORAL | Status: DC

## 2023-12-31 NOTE — Progress Notes (Signed)
 SLP Cancellation Note  Patient Details Name: Alan Wise MRN: [id number] DOB: [birth year]   Cancelled treatment: Attempted to see pt for tx session, however, pt refused. Was able to answer Y/N questions to ID sick, though unable to describe further. Denied headache. Did see report of dizziness in OT tx session earlier. Will return as able. 60 mins missed.     [name] 12/31/2023, 10:23 AM

## 2023-12-31 NOTE — Progress Notes (Signed)
 Occupational Therapy Session Note  Patient Details  Name: Alan Wise MRN: [id number] Date of Birth: 1971-01-16  Today's Date: 12/31/2023 OT Individual Time: 9264-9198 OT Individual Time Calculation (min): 26 min  19 mins missed d/t pain    Short Term Goals: Week 1:  OT Short Term Goal 1 (Week 1): STG=LTG d/t ELOS  Skilled Therapeutic Interventions/Progress Updates:   Pt greeted semi reclined in bed, pt agreeable to OT intervention.      Transfers/bed mobility/functional mobility: pt completed supine>sit with close CGA. Pt completed stand pivot to w/c with no AD and CGA. Total A transport to gym for time mgmt and energy conservation. Attempted several tasks in gym such as visual scanning tasks at [facility] and active assist ROM tasks with LUE however pt shaking his head and covering his R eye. Pt did state yes to are you dizzy? Pt aphasic and stating that to which is frequently to most questions so difficult to assess true deficits. Opted to return pt to room to assess BP and alert nursing staff as pt has been very participatory up to this point.    Vitals:  159/88( 110) HR 57  from bed level                Ended session with pt supine in bed with all needs within reach and lights off and bed alarm activated.                    Therapy Documentation Precautions:  Precautions Precautions: Fall Recall of Precautions/Restrictions: Intact Precaution/Restrictions Comments: L visual field cut and L hemipareisis at baseline, expressive > receptive aphasia Restrictions Weight Bearing Restrictions Per Provider Order: No Other Position/Activity Restrictions: L visual field cut at baseline General: General OT Amount of Missed Time: 19 Minutes  Pain: Unrated pain indicated by pt closing his eyes frequently, returned pt to room for further assessment from nursing.    Therapy/Group: Individual Therapy  Ronal Gift [facility] 12/31/2023, 8:03 AM

## 2023-12-31 NOTE — Progress Notes (Signed)
 Patient ID: Alan Wise, male   DOB: [birth year], 53 y.o.   MRN: [id number]   SW  faxed outpatient PT/OT/SLP referral to [facility]- [address] location.   Graeme Jude, MSW, LCSW Office: [phone] Cell: [phone] Fax: [phone]

## 2023-12-31 NOTE — Progress Notes (Signed)
 Physical Therapy Session Note  Patient Details  Name: ZAIDIN BLYDEN MRN: [id number] Date of Birth: 1971/03/15  Today's Date: 12/31/2023      Short Term Goals: Week 1:  PT Short Term Goal 1 (Week 1): STG = LTG d/t ELOS  Skilled Therapeutic Interventions/Progress Updates: Pt in bed upon entry with lights off. Per discussion with OTA pt able to communicate sick and was unable to complete session. Pt placed hand on forehead and refused therapy. Pt missed 60 min skilled PT due to illness. Will continue efforts as schedule permits.      Therapy Documentation Precautions:  Precautions Precautions: Fall Recall of Precautions/Restrictions: Intact Precaution/Restrictions Comments: L visual field cut and L hemipareisis at baseline, expressive > receptive aphasia Restrictions Weight Bearing Restrictions Per Provider Order: No Other Position/Activity Restrictions: L visual field cut at baseline General: PT Amount of Missed Time (min): 60 Minutes PT Missed Treatment Reason: Patient unwilling to participate Vital Signs: Therapy Vitals Temp: 97.7 F (36.5 C) Temp Source: Oral Pulse Rate: 62 Resp: 14 BP: (!) 140/82 Patient Position (if appropriate): Sitting Oxygen  Therapy SpO2: 93 % O2 Device: Room Air   Therapy/Group: Individual Therapy  Odelle Kosier 12/31/2023, 3:24 PM

## 2023-12-31 NOTE — Progress Notes (Signed)
 PROGRESS NOTE   Subjective/Complaints:  Notified by nursing approximately 8 AM that patient was ambulating with physical therapy when he became nauseous, holding his face/eyes, requesting to return to room due to discomfort.  Vitals with mild hypertension in the 150s, but otherwise stable.  On a.m. evaluation, patient is slightly more inattentive to the left side but otherwise no new neurologic deficits.  Communicates that he does not have a headache, does have dizziness, somewhat associated with EOMI, along with nausea.  Received p.o. meclizine  12.5 mg, with mild improvement.  Discussed case with Dr. Buckley, appreciate his attention and care in this case, plan as below.  Discussed medical updates with patient's wife, all questions answered.  Per her discussions with him, there is a complicating factor of lack of appetite, which has been going on since this hospitalization.  P.o. intakes are sporadic; he had minimal breakfast this a.m. with medications which may have contributed.  Check back on patient in the early afternoon, he was lethargic after meclizine , had not received antiemetic at that time.  Per nursing, was improved after receiving Zofran , more awake and waving at them for attention.    ROS: limited due to aphasia; negative for headache, vision changes. Positive for nausea, dizziness.  Objective:   No results found.   Recent Labs    12/31/23 0555  WBC 5.8  HGB 15.0  HCT 44.3  PLT 173   Recent Labs    12/31/23 0555  NA 137  K 3.9  CL 101  CO2 27  GLUCOSE 88  BUN 11  CREATININE 0.96  CALCIUM  9.6    Intake/Output Summary (Last 24 hours) at 12/31/2023 0931 Last data filed at 12/30/2023 1300 Gross per 24 hour  Intake 100 ml  Output --  Net 100 ml        Physical Exam: Vital Signs Blood pressure (!) 140/79, pulse (!) 59, temperature 97.7 F (36.5 C), temperature source Oral, resp. rate 17, height 5' 8.5  (1.74 m), weight 59.6 kg, SpO2 97%.    General: Awake, somewhat lethargic but responsive.  Laying in bed, holding head. HENT: conjugate gaze, difficulty with EOMI due to dizziness but no obvious nystagmus; oropharynx moist.+ L visual field cut; unchanged CV: regular rate and rhythm; no JVD Pulmonary: CTA B/L; no W/R/R- good air movement GI: soft, NT, ND, (+)BS. Psychiatric: calm-cooperative with exam.  Somewhat frustrated with expressive aphasia.   Neurological:  Awake, alert. Appropriate responses to 2-3 commands.  With options, oriented to person, place, time. Severe expressive aphasia, moderate receptive deficits, can occasionally answer yes/no appropriately.   Unchanged. Left hemineglect -requires increase stimulation for response in left upper extremity today, but will attend  Strength: RUE-5 out of 5 throughout LUE-2/5 shoulder abduction, biceps 2/5; triceps 2/5; WE 3/5; grip 4/5; FA 2+/5 (only abducts pointer finger)- RLE- 5-/5 throughout LLE- 3/5 throughout Decree sensation to light touch throughout the left hemibody Cranial nerves: Left facial droop, left shoulder droop Tone: No appreciable tone in left upper extremity; flexor synergy pattern--slightly increased today, limited by inattention   Assessment/Plan: 1. Functional deficits which require 3+ hours per day of interdisciplinary therapy in a comprehensive inpatient rehab setting. Physiatrist is  providing close team supervision and 24 hour management of active medical problems listed below. Physiatrist and rehab team continue to assess barriers to discharge/monitor patient progress toward functional and medical goals  Care Tool:  Bathing    Body parts bathed by patient: Right arm, Left arm, Chest, Abdomen, Front perineal area, Right upper leg, Left upper leg, Right lower leg, Left lower leg, Face   Body parts bathed by helper: Buttocks, Right arm     Bathing assist Assist Level: Maximal Assistance - Patient 24 -  49%     Upper Body Dressing/Undressing Upper body dressing   What is the patient wearing?: Pull over shirt    Upper body assist Assist Level: Minimal Assistance - Patient > 75%    Lower Body Dressing/Undressing Lower body dressing      What is the patient wearing?: Underwear/pull up, Pants     Lower body assist Assist for lower body dressing: Minimal Assistance - Patient > 75%     Toileting Toileting    Toileting assist Assist for toileting: Moderate Assistance - Patient 50 - 74%     Transfers Chair/bed transfer  Transfers assist     Chair/bed transfer assist level: Contact Guard/Touching assist     Locomotion Ambulation   Ambulation assist      Assist level: Moderate Assistance - Patient 50 - 74% Assistive device: No Device Max distance: 300'   Walk 10 feet activity   Assist     Assist level: Moderate Assistance - Patient - 50 - 74% Assistive device: No Device   Walk 50 feet activity   Assist    Assist level: Contact Guard/Touching assist Assistive device: No Device    Walk 150 feet activity   Assist    Assist level: Contact Guard/Touching assist Assistive device: No Device    Walk 10 feet on uneven surface  activity   Assist Walk 10 feet on uneven surfaces activity did not occur: Safety/medical concerns         Wheelchair     Assist Is the patient using a wheelchair?: Yes      Wheelchair assist level: Dependent - Patient 0% Max wheelchair distance: 200 ft    Wheelchair 50 feet with 2 turns activity    Assist        Assist Level: Dependent - Patient 0%   Wheelchair 150 feet activity     Assist      Assist Level: Dependent - Patient 0%   Blood pressure (!) 140/79, pulse (!) 59, temperature 97.7 F (36.5 C), temperature source Oral, resp. rate 17, height 5' 8.5 (1.74 m), weight 59.6 kg, SpO2 97%.  Medical Problem List and Plan: 1. 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 PT improving. SLP appears similar to last admission - per SLP was making progress in OP prior to this - very limited frustration tolerance.   -7/7: Episode of extreme lethargy with left upper extremity movements concerning for seizure after receiving 10 mg of IV Compazine .  EEG with cortical dysfunction from the right frontotemporal region, postictal state, no epileptiform discharges--was in  place during another similar event.  CT head unchanged.  Neurology consulted/curbside, likely combination of postictal and medication sensitivity, increasing Lamictal  and reduce Compazine  to 5 mg dosing.  Patient monitored throughout the afternoon, improved.  7-9: Will complete family training remotely due to difficulty coordinating family  7-10:Discussed case with Dr. Buckley; agree that patient is a challenging case with complex intracranial pathology, agree with MRI if nausea/dizziness does not improve in 2 to 3 days or if acute change in neurologic status.  Agree unlikely etiology of seizures.  Will treat with adjustment of antiemetics and start Decadron  1 mg for 3 days; keep low dose.  2.  Antithrombotics: -DVT/anticoagulation:  Pharmaceutical: Eliquis  5mg  BID- cannot stop             -antiplatelet therapy: N/A  3. Pain Management: Tylenol  PRN.   7-10: No complaints of pain, headaches with nausea or emesis.  4. Mood/Behavior/Sleep: LCSW to follow for evaluation and support.              -antipsychotic agents: N/A  -Melatonin PRN   -Reports last IRF admission mom encouraged him to stop his seizure medication. No issues this admission, will watch closely.   -7-10: Intermittent frustration but overall cooperative and participatory  5.  Neuropsych/cognition: This patient is not fully capable of making decisions on his own behalf.  6. Skin/Wound Care: Routine pressure relief measure.    7. Fluids/Electrolytes/protein calorie malnutrition: monitor I/O. Routine labs. Cont supplements.    - 7-7 labs stable; placed on 100 mL/h's continuous IV fluids for 24 hours due to nausea/somnolence--DC'd 7-8  7-10: Labs stable.  Will give IV fluids today 75 cc/h given ongoing nausea/retching, poor p.o. intakes. P.o. intakes limited by lack of interest in food, will consult nutrition for a.m. and consider appetite stimulant if antinausea medications ineffective.  See #18.  8. T2DM: Hgb A1c-5.4 and diet controlled.  -12/26/23 CBGs fine, not on SSI, no CBG checks ordered-- will tell nursing this is not needed.    9.  HTN: Monitor BP TID--avoid hypoperfusion. Continue Amlodipine  5 mg/day and propranolol  20 mg bid. Blood pressure well-controlled on current regimen; some highs at 7-9, but will trend 7-10: Highs associated with nausea/emesis, improved with symptom management Vitals:   12/28/23 1300 12/28/23 1439 12/28/23 2002 12/29/23 0524  BP: 117/71 124/86 109/81 132/86   12/29/23 1348 12/29/23 2055 12/29/23 2056 12/30/23 0347  BP: (!) 149/80 (!) 151/79 (!) 151/79 133/68   12/30/23 1411 12/30/23 1949 12/30/23 2022 12/31/23 0552  BP: 138/83 110/87 110/87 (!) 140/79    10. Dysphagia: NO straws to decrease aspiration events.  --GI consult recommended as retention of solids contribute to aspiration. Per Dr. Lizabeth note this was discussed with GI and who felt patient to be too high risk for GI procedures and recommended follow up after d/c. -SLP following --no complaints or concerns for aspiration this admission  11. Seizure d/o; On Lamictal  150 mg am and 200 mg pm-->educate on compliance.  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    13. Dyslipidemia: LDL-129 and HDL 27. On Lipitor 40 mg/day.   14. Frequency/Nocturia: Gets up at nights and pt/wife agreed to toileting at 11 pm and 3 am to prevent attempts out of bed.  7/4- pt refused these scheduled  get up times, but fell- asked nursing to try and get him up for nocturia Continent of bladder  15. Spasticity - concerned about starting Any spasticity meds due to risk of sedation/confusion - suggest to be assessed for Botulinum toxin for LUE.   7-7: Minimal tone in left upper extremity, and functional strength in limb; feel recovery would be stymied by use of Botox at this time, continue conservative management.    16. L inattention/neglect-secondary to #1.  17. Bowels: no BM documented since 7/1 but pt states he's going daily, however history is limited d/t aphasia. Adamant that he had a BM yesterday. Asked nursing to please document.     - 7/7: Increase miralax  to BID, add sennakot S 1 tab BID   7-8: 2x bowel movements yesterday, reduce MiraLAX  to daily as needed  18.  Nausea/dizziness -recurrent, central vertigo versus gastric irritation from poor p.o. intake/high pill burden   7-7: Compazine  reduce to 5 mg p.o./IV due to somnolence.-->  2.5 mg p.o. as needed, with benefit 7-8  7-10: Discussed case with Dr. Buckley as above; hold off on MRI for now, interventions as follows   Protonix  40 mg twice daily for GI protection   Scheduled Zofran  8 mg every 8 hours; will leave Compazine  as as needed   Decadron  1 mg daily for 3 days   Scopolamine patch every 72 hours--will hold off on initiating until a.m., to monitor improvement with other measures   IV fluids normal saline 75 cc/h tonight as above   PT vestibular evaluation ordered--will hold off for today given severity of symptoms     LOS: 7 days A FACE TO FACE EVALUATION WAS PERFORMED  Joesph JAYSON Likes 12/31/2023,  9:31 AM

## 2023-12-31 NOTE — Progress Notes (Signed)
 Physical Therapy Session Note  Patient Details  Name: Alan Wise MRN: [id number] Date of Birth: Nov 13, 1970  Today's Date: 12/31/2023 PT Individual Time: 1302-1400 PT Individual Time Calculation (min): 58 min   Short Term Goals: Week 1:  PT Short Term Goal 1 (Week 1): STG = LTG d/t ELOS  Skilled Therapeutic Interventions/Progress Updates:     Pt received semi reclined in bed, but seen keeled forward and vomiting into blanket. Pt does no indicate pain, but appears to be feeling unwell and is very minimally verbal during session. PT assists pt to side of bed with minA and removes blanket, providing pt with emesis bag. Pt continues to dry heave for several minutes. Pt performs ambulatory transfer to toilet with minA and cues for positioning and safety. Following toileting, PT encourages pt to take a shower for hygiene and pt is agreeable. Pt holds onto grab bar with RUE and PT assists to doff clothing for shower. Pt transfer onto shower chair with minA and facilitation of hip positioning. Pt showers in sitting with cueing for safety, as well as assistance with thoroughness, as pt does not was many parts of body, rather just sitting in the water. At one point PT hands pt shampoo and pt attempts to drink shampoo from bottle, requiring redirection for safety. PT provides totalA for washing hair and upper body. PT assists pt to dry off and transfers to [facility] to get dressed, and pt is again positive for small amount of emesis. RN notified. Pt dresses in sitting and transfers back to bed with minA and cues for sequencing. Pt left semi reclined with all needs within reach   Therapy Documentation Precautions:  Precautions Precautions: Fall Recall of Precautions/Restrictions: Intact Precaution/Restrictions Comments: L visual field cut and L hemipareisis at baseline, expressive > receptive aphasia Restrictions Weight Bearing Restrictions Per Provider Order: No Other Position/Activity Restrictions: L  visual field cut at baseline  Therapy/Group: Individual Therapy  [name], PT, DPT 12/31/2023, 3:52 PM

## 2023-12-31 NOTE — Progress Notes (Signed)
 Notified MD of new onset of dizziness during OT session with increased blood pressure. See new orders for meclizine .

## 2023-12-31 NOTE — Plan of Care (Signed)
  Problem: RH BLADDER ELIMINATION Goal: RH STG MANAGE BLADDER WITH ASSISTANCE Description: STG Manage Bladder With toileting Assistance Outcome: Progressing   Problem: RH SAFETY Goal: RH STG ADHERE TO SAFETY PRECAUTIONS W/ASSISTANCE/DEVICE Description: STG Adhere to Safety Precautions With cues Assistance/Device. Outcome: Progressing   Problem: RH KNOWLEDGE DEFICIT GENERAL Goal: RH STG INCREASE KNOWLEDGE OF SELF CARE AFTER HOSPITALIZATION Description: Patient and wife will be able to manage care at discharge using educational resources for medications and dietary modification independently Outcome: Progressing

## 2024-01-01 ENCOUNTER — Encounter: Admitting: Speech Pathology

## 2024-01-01 DIAGNOSIS — E440 Moderate protein-calorie malnutrition: Secondary | ICD-10-CM | POA: Insufficient documentation

## 2024-01-01 MED ORDER — ENSURE PLUS HIGH PROTEIN PO LIQD
237.0000 mL | Freq: Three times a day (TID) | ORAL | Status: DC
  Administered 2024-01-01 – 2024-01-11 (×14): 237 mL via ORAL

## 2024-01-01 MED ORDER — ADULT MULTIVITAMIN W/MINERALS CH
1.0000 | ORAL_TABLET | Freq: Every day | ORAL | Status: DC
  Administered 2024-01-01 – 2024-01-11 (×11): 1 via ORAL
  Filled 2024-01-01 (×10): qty 1

## 2024-01-01 NOTE — Progress Notes (Signed)
 Speech Language Pathology Daily Session Note  Patient Details  Name: Alan Wise MRN: [id number] Date of Birth: 06-29-1970  Today's Date: 01/01/2024 SLP Individual Time: 1330-1430 SLP Individual Time Calculation (min): 60 min  Short Term Goals: Week 1: SLP Short Term Goal 1 (Week 1): STGs = LTGs d/t ELOS  Skilled Therapeutic Interventions:   Pt greeted at bedside after noon meal. He was awake/alert upon SLP arrival and appeared to be feeling better as compared to previous day. He was agreeable to tx tasks targeting language. SLP facilitiated task unscrambling written automatics. He independently unscrambled the months of the year but required maxA to verbalize them in order. Pt noted to point to and verbalize random months, but benefited from maxA cues to accurately sequence verbalizations despite visual cues. He indepedently sequenced 1-20 as well and benefited from s cues to verbalize. He then required modA cues during phrase completion task (opposites). During simple responsive naming, he benefited from max-DEP cues. Throughout responsive naming task, pt demonstrated consistent difficulty initiating responses other than his familiar jargon that which is and I... However, when provided w/ lead ins, success improved significantly. Pt noted to ID errors intermittently, commenting no after a paraphasic response, but benefited from maxA overall for error awareness. At the end of tx tasks, he was left in his bed with the alarm set and call light within reach. Recommend cont ST per POC.    Pain Pain Assessment Pain Scale: 0-10 Pain Score: 0-No pain  Therapy/Group: Individual Therapy  [name] 01/01/2024, 1:46 PM

## 2024-01-01 NOTE — Progress Notes (Signed)
 Occupational Therapy Weekly Progress Note  Patient Details  Name: Alan Wise MRN: [id number] Date of Birth: Jun 25, 1970  Beginning of progress report period: 12/25/23 End of progress report period: 01/01/24  Today's Date: 01/01/2024 OT Individual Time: 9261-9166 OT Individual Time Calculation (min): 55 min   Eliasar has made great progress in his first week in CIR. He has progressed to a CGA- (S) level overall with Adls and transfers. He has had fluctuating medical stability and had multiple neurological episodes that have set him back and pushed d/c out to Tuesday. Brief family education provided to his wife already.   Patient continues to demonstrate the following deficits: muscle weakness and muscle joint tightness, impaired timing and sequencing, abnormal tone, decreased coordination, and decreased motor planning, decreased visual perceptual skills, decreased midline orientation and decreased attention to left, decreased problem solving, decreased safety awareness, and delayed processing, and decreased standing balance, decreased postural control, hemiplegia, and decreased balance strategies and therefore will continue to benefit from skilled OT intervention to enhance overall performance with BADL and iADL.  Patient progressing toward long term goals..  Continue plan of care.  OT Short Term Goals Week 1:  OT Short Term Goal 1 (Week 1): STG=LTG d/t ELOS Week 2:  OT Short Term Goal 1 (Week 2): Continue progressing toward LTGs  Skilled Therapeutic Interventions/Progress Updates:    Session focused on LUE NMR with focus on high repetitions/intensity. He was received EOB with RN present- no c/o pain, eager to get started. Functional mobility to the therapy gym, 200 ft, with CGA. Skilled cueing required for L foot clearance and maintaining a straight trajectory d/t often veering L. He completed dynamic IADL activity- holding laundry basket with BUE and then collecting various weights/shaped  items with his LUE to address functional reach, grasping, and L attention. Min OT facilitation at the elbow to reach full elbow extension and for hand prehension. He then transferred onto the Nustep and used his L UE ONLY to push/pull for 50 total repetitions. He required min facilitation at the wrist to maintain neutral. He completed functional grasp/release and functional reach into cabinets in the ADL apt with min facilitation for shoulder flexion to 90 degrees. Forced use promoted to maximize NMR and attention to L hemibody. He worked on forearm pronation/supination by filling up a cup with water and pouring it into a bowl that the R hand was holding to also promote BUE integration. Pt requiring rest breaks intermittently throughout session. Today the LUE in general was much more functional in AROM and coordination, especially in tone. He was able to fully open/close hand and move shoulder to 90 degrees several times against gravity. Pt was left sitting up in the recliner with all needs met, chair alarm set, and call bell within reach.    Therapy Documentation Precautions:  Precautions Precautions: Fall Recall of Precautions/Restrictions: Intact Precaution/Restrictions Comments: L visual field cut and L hemipareisis at baseline, expressive > receptive aphasia Restrictions Weight Bearing Restrictions Per Provider Order: No Other Position/Activity Restrictions: L visual field cut at baseline  Therapy/Group: Individual Therapy  [name] 01/01/2024, 6:15 AM

## 2024-01-01 NOTE — Progress Notes (Signed)
 Initial Nutrition Assessment  DOCUMENTATION CODES:   Non-severe (moderate) malnutrition in context of acute illness/injury  INTERVENTION:   Ensure Plus High Protein po BID, each supplement provides 350 kcal and 20 grams of protein. MVI with minerals daily. Magic cup TID with meals, each supplement provides 290 kcal and 9 grams of protein.  NUTRITION DIAGNOSIS:   Moderate Malnutrition related to acute illness (CVA) as evidenced by energy intake < 75% for > 7 days, mild fat depletion, mild muscle depletion.  GOAL:   Patient will meet greater than or equal to 90% of their needs  MONITOR:   PO intake, Supplement acceptance  REASON FOR ASSESSMENT:   Consult Assessment of nutrition requirement/status, Poor PO  ASSESSMENT:   53 yo male admitted with functional deficits d/t stroke. PMH includes DM, kidney stones, HLD, GERD, skin cancer.  Patient with expressive aphasia. He reports that he is eating poorly d/t dislike of the hospital food. He endorses weight loss, but unable to say why. He is drinking the Ensure supplements. Discussed the importance of adequate nutrition for recovery.   Currently on a dysphagia 3 diet with thin liquids. Meal intakes 0-100%, averaging 48% for the past 8 meals documented.  Labs reviewed.  CBG: [phone]  Medications reviewed and include decadron , protonix , senokot-s. IVF: NS at 75 ml/h  Weight history reviewed. Patient with 11% weight loss over the past 3 months. Patient meets criteria for moderate malnutrition, given mild depletion of muscle and subcutaneous fat mass.  NUTRITION - FOCUSED PHYSICAL EXAM:  Flowsheet Row Most Recent Value  Orbital Region Mild depletion  Upper Arm Region Mild depletion  Thoracic and Lumbar Region Mild depletion  Buccal Region Mild depletion  Temple Region Mild depletion  Clavicle Bone Region Mild depletion  Clavicle and Acromion Bone Region Mild depletion  Scapular Bone Region Mild depletion  Dorsal Hand  No depletion  Patellar Region Unable to assess  Anterior Thigh Region Unable to assess  Posterior Calf Region Unable to assess  Edema (RD Assessment) Unable to assess  Hair Reviewed  Eyes Reviewed  Mouth Reviewed  Skin Reviewed  Nails Reviewed    Diet Order:   Diet Order             DIET DYS 3 Room service appropriate? Yes; Fluid consistency: Thin  Diet effective now                   EDUCATION NEEDS:   Education needs have been addressed  Skin:  Skin Assessment: Reviewed RN Assessment  Last BM:  7/9 type 6  Height:   Ht Readings from Last 1 Encounters:  12/24/23 5' 8.5 (1.74 m)    Weight:   Wt Readings from Last 1 Encounters:  12/24/23 59.6 kg    Ideal Body Weight:  71.4 kg  BMI:  Body mass index is 19.69 kg/m.  Estimated Nutritional Needs:   Kcal:  1900-2100  Protein:  80-100 gm  Fluid:  1.9-2.1 L   Suzen HUNT RD, LDN, CNSC Contact via secure chat. If unavailable, use group chat RD Inpatient.

## 2024-01-01 NOTE — Progress Notes (Signed)
 PROGRESS NOTE   Subjective/Complaints: No emesis reported overnight, no complaints.  Vitals are stable. Ate 10% of breakfast this morning; minimal p.o. intakes yesterday.  Remains on continuous IV fluids.  Up sitting up at bedside, eating cereal today.  States he is feeling much better, no headache, no dizziness, no nausea.  Slept well overnight, feeling good today.  ROS: limited due to aphasia; negative for headache, vision changes.  Objective:   No results found.   Recent Labs    12/31/23 0555  WBC 5.8  HGB 15.0  HCT 44.3  PLT 173   Recent Labs    12/31/23 0555  NA 137  K 3.9  CL 101  CO2 27  GLUCOSE 88  BUN 11  CREATININE 0.96  CALCIUM  9.6    Intake/Output Summary (Last 24 hours) at 01/01/2024 0947 Last data filed at 01/01/2024 0920 Gross per 24 hour  Intake 118 ml  Output --  Net 118 ml        Physical Exam: Vital Signs Blood pressure 122/69, pulse 61, temperature 97.7 F (36.5 C), resp. rate 14, height 5' 8.5 (1.74 m), weight 59.6 kg, SpO2 97%.    General: Awake, somewhat lethargic but responsive.  Sitting up in bedside chair. HENT: EOMI, limited by left field cuts; oropharynx moist.  CV: regular rate and rhythm; no JVD Pulmonary: CTA B/L; no W/R/R- good air movement GI: soft, NT, ND, (+)BS. Psychiatric: calm-cooperative with exam.  Appropriate mood and affect.  Neurological:  Awake, alert--much more attentive than yesterday.  Appropriate responses to simple commands, yes/no, and thumbs up. Severe expressive aphasia, moderate receptive deficits, can occasionally answer yes/no appropriately.   Unchanged. Left hemineglect -much improved today, minimal stimulation to attend to left upper extremity  Strength: RUE-5 out of 5 throughout LUE-3/5 shoulder abduction, biceps 4/5; triceps 4/5; WE 3/5; grip 4/5; FA 1-2/5 RLE- 5-/5 throughout LLE- 3-4 proximally, 4/5 distally Decreased sensation to  light touch throughout the left hemibody Cranial nerves: Left facial droop, left shoulder droop Tone: No appreciable tone in left upper extremity; flexor synergy pattern--back to baseline  Assessment/Plan: 1. Functional deficits which require 3+ hours per day of interdisciplinary therapy in a comprehensive inpatient rehab setting. Physiatrist is providing close team supervision and 24 hour management of active medical problems listed below. Physiatrist and rehab team continue to assess barriers to discharge/monitor patient progress toward functional and medical goals  Care Tool:  Bathing    Body parts bathed by patient: Right arm, Left arm, Chest, Abdomen, Front perineal area, Right upper leg, Left upper leg, Right lower leg, Left lower leg, Face   Body parts bathed by helper: Buttocks, Right arm     Bathing assist Assist Level: Maximal Assistance - Patient 24 - 49%     Upper Body Dressing/Undressing Upper body dressing   What is the patient wearing?: Pull over shirt    Upper body assist Assist Level: Minimal Assistance - Patient > 75%    Lower Body Dressing/Undressing Lower body dressing      What is the patient wearing?: Underwear/pull up, Pants     Lower body assist Assist for lower body dressing: Minimal Assistance - Patient > 75%  Toileting Toileting    Toileting assist Assist for toileting: Moderate Assistance - Patient 50 - 74%     Transfers Chair/bed transfer  Transfers assist     Chair/bed transfer assist level: Contact Guard/Touching assist     Locomotion Ambulation   Ambulation assist      Assist level: Moderate Assistance - Patient 50 - 74% Assistive device: No Device Max distance: 300'   Walk 10 feet activity   Assist     Assist level: Moderate Assistance - Patient - 50 - 74% Assistive device: No Device   Walk 50 feet activity   Assist    Assist level: Contact Guard/Touching assist Assistive device: No Device    Walk  150 feet activity   Assist    Assist level: Contact Guard/Touching assist Assistive device: No Device    Walk 10 feet on uneven surface  activity   Assist Walk 10 feet on uneven surfaces activity did not occur: Safety/medical concerns         Wheelchair     Assist Is the patient using a wheelchair?: Yes      Wheelchair assist level: Dependent - Patient 0% Max wheelchair distance: 200 ft    Wheelchair 50 feet with 2 turns activity    Assist        Assist Level: Dependent - Patient 0%   Wheelchair 150 feet activity     Assist      Assist Level: Dependent - Patient 0%   Blood pressure 122/69, pulse 61, temperature 97.7 F (36.5 C), resp. rate 14, height 5' 8.5 (1.74 m), weight 59.6 kg, SpO2 97%.  Medical Problem List and Plan: 1. 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 PT improving. SLP appears similar to last admission - per SLP was making progress in OP prior to this - very limited frustration tolerance.   -7/7: Episode of extreme lethargy with left upper extremity movements concerning for seizure after receiving 10 mg of IV Compazine .  EEG with cortical dysfunction from the right frontotemporal region, postictal state, no epileptiform discharges--was in place during another similar event.  CT head unchanged.  Neurology consulted/curbside, likely combination of postictal and medication sensitivity, increasing Lamictal  and reduce Compazine  to 5 mg dosing.  Patient monitored throughout the afternoon, improved.  7-9: Will complete family training remotely due to difficulty coordinating family   7-10:Discussed case with Dr. Buckley; agree that patient is a challenging  case with complex intracranial pathology, agree with MRI if nausea/dizziness does not improve in 2 to 3 days or if acute change in neurologic status.  Agree unlikely etiology of seizures.  Will treat with adjustment of antiemetics and start Decadron  1 mg for 3 days; keep low dose.   7/11: Doing much better! Continue monitorring  2.  Antithrombotics: -DVT/anticoagulation:  Pharmaceutical: Eliquis  5mg  BID- cannot stop             -antiplatelet therapy: N/A  3. Pain Management: Tylenol  PRN.   7-10: No complaints of pain, headaches with nausea or emesis.  4. Mood/Behavior/Sleep: LCSW to follow for evaluation and support.              -  antipsychotic agents: N/A  -Melatonin PRN   -Reports last IRF admission mom encouraged him to stop his seizure medication. No issues this admission, will watch closely.   -7-10: Intermittent frustration but overall cooperative and participatory  5. Neuropsych/cognition: This patient is not fully capable of making decisions on his own behalf.  6. Skin/Wound Care: Routine pressure relief measure.    7. Fluids/Electrolytes/moderate protein calorie malnutrition: monitor I/O. Routine labs. Cont supplements.    - 7-7 labs stable; placed on 100 mL/h's continuous IV fluids for 24 hours due to nausea/somnolence--DC'd 7-8  7-10: Labs stable.  Will give IV fluids today 75 cc/h given ongoing nausea/retching, poor p.o. intakes. P.o. intakes limited by lack of interest in food, will consult nutrition for a.m. and consider appetite stimulant if antinausea medications ineffective.  See #18.  7/11: Nutrition consult completed, appreciate recommendations.  Notable patient has had 11% weight loss in the last 3 months.  8. T2DM: Hgb A1c-5.4 and diet controlled.  -12/26/23 CBGs fine, not on SSI, no CBG checks ordered-- will tell nursing this is not needed.    9.  HTN: Monitor BP TID--avoid hypoperfusion. Continue Amlodipine  5 mg/day and propranolol  20 mg bid. Blood pressure  well-controlled on current regimen; some highs at 7-9, but will trend 7-10: Highs associated with nausea/emesis, improved with symptom management--looking much better 7-11 Vitals:   12/29/23 1348 12/29/23 2055 12/29/23 2056 12/30/23 0347  BP: (!) 149/80 (!) 151/79 (!) 151/79 133/68   12/30/23 1411 12/30/23 1949 12/30/23 2022 12/31/23 0552  BP: 138/83 110/87 110/87 (!) 140/79   12/31/23 1122 12/31/23 1420 12/31/23 2009 12/31/23 2102  BP: 133/64 (!) 140/82 122/69 122/69    10. Dysphagia: NO straws to decrease aspiration events.  --GI consult recommended as retention of solids contribute to aspiration. Per Dr. Lizabeth note this was discussed with GI and who felt patient to be too high risk for GI procedures and recommended follow up after d/c. -SLP following --no complaints or concerns for aspiration this admission  11. Seizure d/o; On Lamictal  150 mg am and 200 mg pm-->educate on compliance.  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    13. Dyslipidemia: LDL-129 and HDL 27. On Lipitor 40 mg/day.   14. Frequency/Nocturia: Gets up at nights and pt/wife agreed to toileting at 11 pm and 3 am to prevent attempts out of bed.  7/4- pt refused these scheduled get up times, but fell- asked nursing to try and get him up for nocturia Continent of bladder  15. Spasticity - concerned about starting Any spasticity meds due to risk of sedation/confusion - suggest to be assessed for Botulinum toxin for LUE.   7-7: Minimal tone in left upper extremity, and functional strength in limb; perceived pout tone is mostly flexor posturing with left inattention, which he can break with volitional relaxation.  Feel recovery would be stymied by use of Botox at this time, continue conservative management.     16. L inattention/neglect-secondary to #1.  17. Bowels: no BM documented since 7/1 but pt states he's going daily, however history is limited d/t aphasia. Adamant that he had a BM yesterday. Asked nursing to please document.     - 7/7: Increase miralax  to BID, add sennakot S 1 tab BID   7-8: 2x bowel  movements yesterday, reduce MiraLAX  to daily as needed  18.  Nausea/dizziness -recurrent, central vertigo versus gastric irritation from poor p.o. intake/high pill burden   7-7: Compazine  reduce to 5 mg p.o./IV due to somnolence.-->  2.5 mg p.o. as needed, with benefit 7-8  7-10: Discussed case with Dr. Buckley as above; hold off on MRI for now, interventions as follows   Protonix  40 mg twice daily for GI protection   Scheduled Zofran  8 mg every 8 hours; will leave Compazine  as as needed   Decadron  1 mg daily for 3 days   Scopolamine  patch every 72 hours--will hold off on initiating until a.m., to monitor improvement with other measures   IV fluids normal saline 75 cc/h tonight as above   PT vestibular evaluation ordered--will hold off for today given severity of symptoms   7-11: Doing much better with measures as described above, is eating today.  No further dizziness.  Will DC IV fluids, if any issues over the weekend would start scopolamine  patch and consider MRI brain.     LOS: 8 days A FACE TO FACE EVALUATION WAS PERFORMED  Joesph JAYSON Likes 01/01/2024, 9:47 AM

## 2024-01-01 NOTE — Plan of Care (Signed)
  Problem: RH BLADDER ELIMINATION Goal: RH STG MANAGE BLADDER WITH ASSISTANCE Description: STG Manage Bladder With toileting Assistance Outcome: Progressing   Problem: RH SAFETY Goal: RH STG ADHERE TO SAFETY PRECAUTIONS W/ASSISTANCE/DEVICE Description: STG Adhere to Safety Precautions With cues Assistance/Device. Outcome: Progressing   Problem: Consults Goal: RH STROKE PATIENT EDUCATION Description: See Patient Education module for education specifics  Outcome: Progressing

## 2024-01-01 NOTE — Progress Notes (Signed)
 Physical Therapy Session Note  Patient Details  Name: Alan Wise MRN: [id number] Date of Birth: 05-07-1971  Today's Date: 01/01/2024 PT Individual Time: 1135-1220 PT Individual Time Calculation (min): 45 min   Short Term Goals: Week 2:     Skilled Therapeutic Interventions/Progress Updates:    Today's treatment focused on improvement of ambulatory quality, dynamic balance, and anticipatory balance strategies. This was accomplished using the exercises and transfers listed below. Pt showed great tolerance to administered treatment with no adverse effects by the end of session. By end of session, pt demonstrated expected levels of fatigue, and had one LOB which required min-A alongside verbal cuing to move your leg under you for recovery. Skilled intervention was utilized via activity modification for pt tolerance with task completion, functional progression/regression promoting best outcomes inline with current rehab goals, as well as maximal verbal/tactile cuing. Continue with therapeutic focus on reactive balance, prolonged ambulation, and general dynamic balance strategies to promote independence.  Pt was left in bed with rails up, Alarm was set, call bell and all necessities were in reach.  Exercises: Gait training no AD, Min A Prolonged ambulation to day room Forward walking with turns Forward walking with figure 8 pattern Backward walking with figure 8 pattern Back ward walking with turn Stand pivot 180d between two targets w/horseshoe toss for dynamic reaching  Transfers: STS - no AD, CGA Sit to supine: no AD, supervision  Therapy Documentation Precautions:  Precautions Precautions: Fall Recall of Precautions/Restrictions: Intact Precaution/Restrictions Comments: L visual field cut and L hemipareisis at baseline, expressive > receptive aphasia Restrictions Weight Bearing Restrictions Per Provider Order: No Other Position/Activity Restrictions: L visual field cut at  baseline  Pain: no signs or report of pain   Therapy/Group: Individual Therapy  Mabel Kiang, PT, DPT 01/01/2024, 12:48 PM

## 2024-01-01 NOTE — Progress Notes (Signed)
 Physical Therapy Session Note  Patient Details  Name: ODIE EDMONDS MRN: [id number] Date of Birth: 1971-03-26  Today's Date: 01/01/2024 PT Individual Time: 1016-1100 PT Individual Time Calculation (min): 44 min   Short Term Goals: Week 1:  PT Short Term Goal 1 (Week 1): STG = LTG d/t ELOS  Skilled Therapeutic Interventions/Progress Updates: Pt presents sitting in recliner and agreeable to therapy.  Pt transfers sit to stand w/ CGA.  Pt amb to main gym w/ min A and cues for BOS.  Pt requires increased cueing for attention to Left side.  As pt performs during session, attention improves.  Pt experiences 2 scissoring episodes requiring increased assist to maintain upright stance.  Pt negotiated cone obstacle course x 2 w/ improved attention to Left.  Pt retrieved all cones from floor and returning them to shelf singly, alternating hands.  Donned 2.5# aw to lle w/ improved foot clearance.  Pt amb multiple trials w/ directions stressing attention to left.  Pt returned to room and transferred to recliner.  Seat alarm on and all needs in reach.      Therapy Documentation Precautions:  Precautions Precautions: Fall Recall of Precautions/Restrictions: Intact Precaution/Restrictions Comments: L visual field cut and L hemipareisis at baseline, expressive > receptive aphasia Restrictions Weight Bearing Restrictions Per Provider Order: No Other Position/Activity Restrictions: L visual field cut at baseline General:   Vital Signs:   Pain:0/10     Therapy/Group: Individual Therapy  Zayleigh Stroh P Breean Nannini 01/01/2024, 11:02 AM

## 2024-01-02 MED ORDER — POLYETHYLENE GLYCOL 3350 17 G PO PACK
17.0000 g | PACK | Freq: Every day | ORAL | Status: DC
  Administered 2024-01-02 – 2024-01-11 (×10): 17 g via ORAL
  Filled 2024-01-02 (×10): qty 1

## 2024-01-02 NOTE — Progress Notes (Signed)
 PROGRESS NOTE   Subjective/Complaints:  Pt doing well today, slept well, denies pain, LBM yesterday per pt but not documented since 7/9. Urinating fine. No other complaints or concerns though history is limited d/t aphasia.   ROS: limited due to aphasia; negative for headache, vision changes.  Objective:   No results found.   Recent Labs    12/31/23 0555  WBC 5.8  HGB 15.0  HCT 44.3  PLT 173   Recent Labs    12/31/23 0555  NA 137  K 3.9  CL 101  CO2 27  GLUCOSE 88  BUN 11  CREATININE 0.96  CALCIUM  9.6    Intake/Output Summary (Last 24 hours) at 01/02/2024 1013 Last data filed at 01/01/2024 2100 Gross per 24 hour  Intake 274 ml  Output --  Net 274 ml        Physical Exam: Vital Signs Blood pressure 136/69, pulse (!) 56, temperature (!) 97.4 F (36.3 C), temperature source Oral, resp. rate 12, height 5' 8.5 (1.74 m), weight 59.6 kg, SpO2 98%.    General: Awake, alert.  Sitting up in bed HENT: EOMI, limited by left field cuts; oropharynx moist.  CV: regular rate and rhythm; no JVD Pulmonary: CTA B/L; no W/R/R- good air movement GI: soft, NT, ND, (+)BS. Psychiatric: calm-cooperative with exam.  Appropriate mood and affect. Neuro: L hemineglect and L sided weakness. Answers yes/no appropriately. Short sentences at times. Awake and alert.   PRIOR EXAMS: Neurological:  Awake, alert--much more attentive than yesterday.  Appropriate responses to simple commands, yes/no, and thumbs up. Severe expressive aphasia, moderate receptive deficits, can occasionally answer yes/no appropriately.   Unchanged. Left hemineglect -much improved today, minimal stimulation to attend to left upper extremity  Strength: RUE-5 out of 5 throughout LUE-3/5 shoulder abduction, biceps 4/5; triceps 4/5; WE 3/5; grip 4/5; FA 1-2/5 RLE- 5-/5 throughout LLE- 3-4 proximally, 4/5 distally Decreased sensation to light touch  throughout the left hemibody Cranial nerves: Left facial droop, left shoulder droop Tone: No appreciable tone in left upper extremity; flexor synergy pattern--back to baseline  Assessment/Plan: 1. Functional deficits which require 3+ hours per day of interdisciplinary therapy in a comprehensive inpatient rehab setting. Physiatrist is providing close team supervision and 24 hour management of active medical problems listed below. Physiatrist and rehab team continue to assess barriers to discharge/monitor patient progress toward functional and medical goals  Care Tool:  Bathing    Body parts bathed by patient: Right arm, Left arm, Chest, Abdomen, Front perineal area, Right upper leg, Left upper leg, Right lower leg, Left lower leg, Face   Body parts bathed by helper: Buttocks, Right arm     Bathing assist Assist Level: Maximal Assistance - Patient 24 - 49%     Upper Body Dressing/Undressing Upper body dressing   What is the patient wearing?: Pull over shirt    Upper body assist Assist Level: Minimal Assistance - Patient > 75%    Lower Body Dressing/Undressing Lower body dressing      What is the patient wearing?: Underwear/pull up, Pants     Lower body assist Assist for lower body dressing: Minimal Assistance - Patient > 75%  Toileting Toileting    Toileting assist Assist for toileting: Moderate Assistance - Patient 50 - 74%     Transfers Chair/bed transfer  Transfers assist     Chair/bed transfer assist level: Contact Guard/Touching assist     Locomotion Ambulation   Ambulation assist      Assist level: Minimal Assistance - Patient > 75% Assistive device: No Device Max distance: 300'   Walk 10 feet activity   Assist     Assist level: Minimal Assistance - Patient > 75% Assistive device: No Device   Walk 50 feet activity   Assist    Assist level: Minimal Assistance - Patient > 75% Assistive device: No Device    Walk 150 feet  activity   Assist    Assist level: Minimal Assistance - Patient > 75% Assistive device: No Device    Walk 10 feet on uneven surface  activity   Assist Walk 10 feet on uneven surfaces activity did not occur: Safety/medical concerns         Wheelchair     Assist Is the patient using a wheelchair?: Yes      Wheelchair assist level: Dependent - Patient 0% Max wheelchair distance: 200 ft    Wheelchair 50 feet with 2 turns activity    Assist        Assist Level: Dependent - Patient 0%   Wheelchair 150 feet activity     Assist      Assist Level: Dependent - Patient 0%   Blood pressure 136/69, pulse (!) 56, temperature (!) 97.4 F (36.3 C), temperature source Oral, resp. rate 12, height 5' 8.5 (1.74 m), weight 59.6 kg, SpO2 98%.  Medical Problem List and Plan: 1. 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 PT improving. SLP appears similar to last admission - per SLP was making progress in OP prior to this - very limited frustration tolerance.   -7/7: Episode of extreme lethargy with left upper extremity movements concerning for seizure after receiving 10 mg of IV Compazine .  EEG with cortical dysfunction from the right frontotemporal region, postictal state, no epileptiform discharges--was in place during another similar event.  CT head unchanged.  Neurology consulted/curbside, likely combination of postictal and medication sensitivity, increasing Lamictal  and reduce Compazine  to 5 mg dosing.  Patient monitored throughout the afternoon, improved. 7-9: Will complete family training remotely due to difficulty coordinating family  7-10:Discussed case with Dr. Buckley; agree that patient is  a challenging case with complex intracranial pathology, agree with MRI if nausea/dizziness does not improve in 2 to 3 days or if acute change in neurologic status.  Agree unlikely etiology of seizures.  Will treat with adjustment of antiemetics and start Decadron  1 mg for 3 days; keep low dose.   7/11: Doing much better! Continue monitoring  2.  Antithrombotics: -DVT/anticoagulation:  Pharmaceutical: Eliquis  5mg  BID- cannot stop             -antiplatelet therapy: N/A  3. Pain Management: Tylenol  PRN.   7-10: No complaints of pain, headaches with nausea or emesis.  4. Mood/Behavior/Sleep: LCSW to follow for evaluation and support.              -  antipsychotic agents: N/A  -Melatonin PRN  -Reports last IRF admission mom encouraged him to stop his seizure medication. No issues this admission, will watch closely.   -7-10: Intermittent frustration but overall cooperative and participatory  5. Neuropsych/cognition: This patient is not fully capable of making decisions on his own behalf.  6. Skin/Wound Care: Routine pressure relief measure.    7. Fluids/Electrolytes/moderate protein calorie malnutrition: monitor I/O. Routine labs. Cont supplements.   - 7-7 labs stable; placed on 100 mL/h's continuous IV fluids for 24 hours due to nausea/somnolence--DC'd 7-8 7-10: Labs stable.  Will give IV fluids today 75 cc/h given ongoing nausea/retching, poor p.o. intakes. P.o. intakes limited by lack of interest in food, will consult nutrition for a.m. and consider appetite stimulant if antinausea medications ineffective.  See #18. 7/11: Nutrition consult completed, appreciate recommendations.  Notable patient has had 11% weight loss in the last 3 months.  8. T2DM: Hgb A1c-5.4 and diet controlled.  -12/26/23 CBGs fine, not on SSI, no CBG checks ordered-- will tell nursing this is not needed.    9.  HTN: Monitor BP TID--avoid hypoperfusion. Continue Amlodipine  5 mg/day and propranolol  20 mg bid. Blood pressure  well-controlled on current regimen; some highs at 7-9, but will trend 7-10: Highs associated with nausea/emesis, improved with symptom management--looking much better 01-01-11 Vitals:   12/30/23 0347 12/30/23 1411 12/30/23 1949 12/30/23 2022  BP: 133/68 138/83 110/87 110/87   12/31/23 0552 12/31/23 1122 12/31/23 1420 12/31/23 2009  BP: (!) 140/79 133/64 (!) 140/82 122/69   12/31/23 2102 01/01/24 1447 01/01/24 1951 01/02/24 0520  BP: 122/69 (!) 141/82 (!) 141/76 136/69    10. Dysphagia: NO straws to decrease aspiration events.  --GI consult recommended as retention of solids contribute to aspiration. Per Dr. Lizabeth note this was discussed with GI and who felt patient to be too high risk for GI procedures and recommended follow up after d/c. -SLP following --no complaints or concerns for aspiration this admission  11. Seizure d/o; On Lamictal  150 mg am and 200 mg pm-->educate on compliance.  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    13. Dyslipidemia: LDL-129 and HDL 27. On Lipitor 40 mg/day.   14. Frequency/Nocturia: Gets up at nights and pt/wife agreed to toileting at 11 pm and 3 am to prevent attempts out of bed.  7/4- pt refused these scheduled get up times, but fell- asked nursing to try and get him up for nocturia Continent of bladder  15. Spasticity - concerned about starting Any spasticity meds due to risk of sedation/confusion - suggest to be assessed for Botulinum toxin for LUE.  7-7: Minimal tone in left upper extremity, and functional strength in limb; perceived pout tone is mostly flexor posturing with left inattention, which he can break with volitional relaxation.  Feel recovery would be stymied by use of Botox at this time, continue conservative management.     16. L inattention/neglect-secondary to #1.  17. Bowels: no BM documented since 7/1 but pt states he's going daily, however history is limited d/t aphasia. Adamant that he had a BM yesterday. Asked nursing to please document.     - 7/7: Increase miralax  to BID, add sennakot S 1 tab BID 7-8: 2x bowel movements yesterday, reduce MiraLAX  to daily as needed -01/02/24  no BM documented in 3 days, will go back to scheduled miralax  daily  18.  Nausea/dizziness -recurrent, central vertigo versus gastric irritation from poor p.o. intake/high pill burden  7-7: Compazine  reduce to 5 mg p.o./IV due to somnolence.-->  2.5 mg p.o. as needed, with benefit 7-8  7-10: Discussed case with Dr. Buckley as above; hold off on MRI for now, interventions as follows   Protonix  40 mg twice daily for GI protection   Scheduled Zofran  8 mg every 8 hours; will leave Compazine  as as needed   Decadron  1 mg daily for 3 days   Scopolamine  patch every 72 hours--will hold off on initiating until a.m., to monitor improvement with other measures   IV fluids normal saline 75 cc/h tonight as above   PT vestibular evaluation ordered--will hold off for today given severity of symptoms   7-11: Doing much better with measures as described above, is eating today.  No further dizziness.  Will DC IV fluids, if any issues over the weekend would start scopolamine  patch and consider MRI brain.  -01/02/24 no complaints this morning, monitor     LOS: 9 days A FACE TO FACE EVALUATION WAS PERFORMED  [address] 01/02/2024, 10:13 AM

## 2024-01-02 NOTE — Progress Notes (Signed)
 Physical Therapy Weekly Progress Note  Patient Details  Name: Alan Wise MRN: [id number] Date of Birth: Oct 13, 1970  Beginning of progress report period: December 25, 2023 End of progress report period: January 02, 2024  Today's Date: 01/02/2024 PT Individual Time: 1350-1444 PT Individual Time Calculation (min): 54 min   Patient has met 0 of 0 short term goals. Pt did not have any short term goals set due to original length of stay being extended due to ongoing medical issues. Pt has made slow progress toward therapy goals with consistent nausea and vomiting impairing ability to participate. Pt has made slow improvements in bed mobility, balance, transfers and ambulation. Pt has been offered family education but did not want family present for session.   Patient continues to demonstrate the following deficits muscle weakness, decreased cardiorespiratoy endurance, abnormal tone and decreased coordination, and decreased sitting balance, decreased standing balance, decreased postural control, hemiplegia, and decreased balance strategies and therefore will continue to benefit from skilled PT intervention to increase functional independence with mobility.  Patient progressing toward long term goals..  Continue plan of care.  PT Short Term Goals Week 1:  PT Short Term Goal 1 (Week 1): STG = LTG d/t ELOS Week 2:  PT Short Term Goal 1 (Week 2): STGs = LTGs  Skilled Therapeutic Interventions/Progress Updates:  Ambulation/gait training;Community reintegration;DME/adaptive equipment instruction;Neuromuscular re-education;Psychosocial support;Stair training;UE/LE Strength taining/ROM;UE/LE Coordination activities;Wheelchair propulsion/positioning;Therapeutic Activities;Skin care/wound management;Pain management;Functional electrical stimulation;Balance/vestibular training;Discharge planning;Cognitive remediation/compensation;Disease management/prevention;Functional mobility training;Patient/family  education;Splinting/orthotics;Therapeutic Exercise;Visual/perceptual remediation/compensation   Pt received semi reclined in ad agrees to therapy. No complaint of pain. Pt performs bed moiblity slowly and with cues for sequencing and positioning at EOB. Sit to stand from EOB with minA and cues for anterior weight shifting and initiation. Pt ambulates x150' to gym with minA at trunk to facilitate Rt lateral weight shifting to promote improved foot clearance on Lt. Pt takes seated rest break and quickly begins to vomit upon sitting. Pt vomits several times but then appears to feel better and no additional emesis for remainder of session. 4lb ankle weight placed on LLT and pt performs ambulation to promote increased NM feedback through LLE and increased work of lifting foot for endurance and strengthening. Pt ambulates x125' with minA and same cues, then completes x12 6 steps with Rt handrail and minA for lateral weight shifting and cues for for placement. Seated rest break. Pt then performs standing activities in parallel bars with mirror for visual feedback. Pt completes 3x20 alternating thigh taps on bar facing mirror. Pt then performs alternating foot taps on cones to require increased motor control and coordination. Pt completes 3x20 foot taps. Pt ambulates back to room with CGA and improved gait pattern for much of distance, but does requires one instance of maxA to prevent fall due to catching Lt foot during swing phase and losing balance. Pt left semi reclined in bed with alarm intact and all needs within reach.    Therapy Documentation Precautions:  Precautions Precautions: Fall Recall of Precautions/Restrictions: Intact Precaution/Restrictions Comments: L visual field cut and L hemipareisis at baseline, expressive > receptive aphasia Restrictions Weight Bearing Restrictions Per Provider Order: No Other Position/Activity Restrictions: L visual field cut at baseline  Therapy/Group: Individual  Therapy  [name], PT, DPT 01/02/2024, 3:52 PM

## 2024-01-02 NOTE — Progress Notes (Signed)
 Occupational Therapy Session Note  Patient Details  Name: Alan Wise MRN: [id number] Date of Birth: 1970/11/15  Today's Date: 01/02/2024 OT Individual Time: 9052-8953 OT Individual Time Calculation (min): 59 min    Short Term Goals: Week 2:  OT Short Term Goal 1 (Week 2): Continue progressing toward LTGs  Skilled Therapeutic Interventions/Progress Updates:    Pt received supine with no c/o pain, agreeable to OT session. Declined ADLs. Pt completed 120 ft of functional mobility with no device, CGA to the ADL apt. He worked on prolonged static standing with functional reaching task isolating the LUE. Initially pt with worse L flexor tone but after guided gentle full extension weightbearing the tone was much improved and he was able to volitionally extend hand before grasping multiple functional items. He was able to achieve shoulder flexion to 90 degrees when reaching into a cabinet and then into the L field to place items into a basket. Mod I static standing balance during task. Rest breaks provided throughout session as needed. He then worked on LUE extension in standing pressing into a mirror with HOH provided by OT to facilitate GH and chest opening while maintaining extension in the elbow and hand. He ended with dynamic stepping task to address balance and righting reaction to reduce fall risk. He required mod cueing for sequencing, safety, pacing, and positioning of LE, as well as min A. He returned to his room. He was left supine with all needs met, bed alarm set.   Therapy Documentation Precautions:  Precautions Precautions: Fall Recall of Precautions/Restrictions: Intact Precaution/Restrictions Comments: L visual field cut and L hemipareisis at baseline, expressive > receptive aphasia Restrictions Weight Bearing Restrictions Per Provider Order: No Other Position/Activity Restrictions: L visual field cut at baseline  Therapy/Group: Individual Therapy  [name] 01/02/2024, 7:53 AM

## 2024-01-03 NOTE — Progress Notes (Signed)
 PROGRESS NOTE   Subjective/Complaints:  Pt doing well again today, slept well, denies pain, LBM yesterday per pt and he's very adamant that it was yesterday, but still not documented since 7/9. Urinating fine. No other complaints or concerns though history is limited d/t aphasia.   ROS: limited due to aphasia; negative for headache, vision changes.  Objective:   No results found.   No results for input(s): WBC, HGB, HCT, PLT in the last 72 hours.  No results for input(s): NA, K, CL, CO2, GLUCOSE, BUN, CREATININE, CALCIUM  in the last 72 hours.  No intake or output data in the 24 hours ending 01/03/24 0950       Physical Exam: Vital Signs Blood pressure (!) 142/81, pulse (!) 54, temperature 97.6 F (36.4 C), resp. rate 12, height 5' 8.5 (1.74 m), weight 59.6 kg, SpO2 96%.    General: Awake, alert.  Sitting up in bed HENT: EOMI, limited by left field cuts; oropharynx moist.  CV: regular rate and rhythm; no JVD Pulmonary: CTA B/L; no W/R/R- good air movement GI: soft, NT, ND, (+)BS. Psychiatric: calm-cooperative with exam.  Appropriate mood and affect. Neuro: L hemineglect and L sided weakness. Answers yes/no seemingly appropriately. Short sentences at times. Awake and alert.   PRIOR EXAMS: Neurological:  Awake, alert--much more attentive than yesterday.  Appropriate responses to simple commands, yes/no, and thumbs up. Severe expressive aphasia, moderate receptive deficits, can occasionally answer yes/no appropriately.   Unchanged. Left hemineglect -much improved today, minimal stimulation to attend to left upper extremity  Strength: RUE-5 out of 5 throughout LUE-3/5 shoulder abduction, biceps 4/5; triceps 4/5; WE 3/5; grip 4/5; FA 1-2/5 RLE- 5-/5 throughout LLE- 3-4 proximally, 4/5 distally Decreased sensation to light touch throughout the left hemibody Cranial nerves: Left facial droop,  left shoulder droop Tone: No appreciable tone in left upper extremity; flexor synergy pattern--back to baseline  Assessment/Plan: 1. Functional deficits which require 3+ hours per day of interdisciplinary therapy in a comprehensive inpatient rehab setting. Physiatrist is providing close team supervision and 24 hour management of active medical problems listed below. Physiatrist and rehab team continue to assess barriers to discharge/monitor patient progress toward functional and medical goals  Care Tool:  Bathing    Body parts bathed by patient: Right arm, Left arm, Chest, Abdomen, Front perineal area, Right upper leg, Left upper leg, Right lower leg, Left lower leg, Face   Body parts bathed by helper: Buttocks, Right arm     Bathing assist Assist Level: Maximal Assistance - Patient 24 - 49%     Upper Body Dressing/Undressing Upper body dressing   What is the patient wearing?: Pull over shirt    Upper body assist Assist Level: Minimal Assistance - Patient > 75%    Lower Body Dressing/Undressing Lower body dressing      What is the patient wearing?: Underwear/pull up, Pants     Lower body assist Assist for lower body dressing: Minimal Assistance - Patient > 75%     Toileting Toileting    Toileting assist Assist for toileting: Moderate Assistance - Patient 50 - 74%     Transfers Chair/bed transfer  Transfers assist     Chair/bed  transfer assist level: Contact Guard/Touching assist     Locomotion Ambulation   Ambulation assist      Assist level: Minimal Assistance - Patient > 75% Assistive device: No Device Max distance: 300'   Walk 10 feet activity   Assist     Assist level: Minimal Assistance - Patient > 75% Assistive device: No Device   Walk 50 feet activity   Assist    Assist level: Minimal Assistance - Patient > 75% Assistive device: No Device    Walk 150 feet activity   Assist    Assist level: Minimal Assistance - Patient >  75% Assistive device: No Device    Walk 10 feet on uneven surface  activity   Assist Walk 10 feet on uneven surfaces activity did not occur: Safety/medical concerns         Wheelchair     Assist Is the patient using a wheelchair?: Yes      Wheelchair assist level: Dependent - Patient 0% Max wheelchair distance: 200 ft    Wheelchair 50 feet with 2 turns activity    Assist        Assist Level: Dependent - Patient 0%   Wheelchair 150 feet activity     Assist      Assist Level: Dependent - Patient 0%   Blood pressure (!) 142/81, pulse (!) 54, temperature 97.6 F (36.4 C), resp. rate 12, height 5' 8.5 (1.74 m), weight 59.6 kg, SpO2 96%.  Medical Problem List and Plan: 1. 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 PT improving. SLP appears similar to last admission - per SLP was making progress in OP prior to this - very limited frustration tolerance.   -7/7: Episode of extreme lethargy with left upper extremity movements concerning for seizure after receiving 10 mg of IV Compazine .  EEG with cortical dysfunction from the right frontotemporal region, postictal state, no epileptiform discharges--was in place during another similar event.  CT head unchanged.  Neurology consulted/curbside, likely combination of postictal and medication sensitivity, increasing Lamictal  and reduce Compazine  to 5 mg dosing.  Patient monitored throughout the afternoon, improved. 7-9: Will complete family training remotely due to difficulty coordinating family  7-10:Discussed case with Dr. Buckley; agree that patient is a challenging case with complex intracranial pathology, agree with MRI if nausea/dizziness does  not improve in 2 to 3 days or if acute change in neurologic status.  Agree unlikely etiology of seizures.  Will treat with adjustment of antiemetics and start Decadron  1 mg for 3 days; keep low dose.   7/11: Doing much better! Continue monitoring  2.  Antithrombotics: -DVT/anticoagulation:  Pharmaceutical: Eliquis  5mg  BID- cannot stop             -antiplatelet therapy: N/A  3. Pain Management: Tylenol  PRN.   7-10: No complaints of pain, headaches with nausea or emesis.  4. Mood/Behavior/Sleep: LCSW to follow for evaluation and support.              -antipsychotic agents: N/A  -Melatonin PRN  -Reports last IRF admission mom encouraged him to stop his seizure medication. No issues this admission, will watch closely.   -7-10: Intermittent frustration  but overall cooperative and participatory  5. Neuropsych/cognition: This patient is not fully capable of making decisions on his own behalf.  6. Skin/Wound Care: Routine pressure relief measure.    7. Fluids/Electrolytes/moderate protein calorie malnutrition: monitor I/O. Routine labs. Cont supplements.   - 7-7 labs stable; placed on 100 mL/h's continuous IV fluids for 24 hours due to nausea/somnolence--DC'd 7-8 7-10: Labs stable.  Will give IV fluids today 75 cc/h given ongoing nausea/retching, poor p.o. intakes. P.o. intakes limited by lack of interest in food, will consult nutrition for a.m. and consider appetite stimulant if antinausea medications ineffective.  See #18. 7/11: Nutrition consult completed, appreciate recommendations.  Notable patient has had 11% weight loss in the last 3 months.  8. T2DM: Hgb A1c-5.4 and diet controlled.  -12/26/23 CBGs fine, not on SSI, no CBG checks ordered-- will tell nursing this is not needed.    9.  HTN: Monitor BP TID--avoid hypoperfusion. Continue Amlodipine  5 mg/day and propranolol  20 mg bid. Blood pressure well-controlled on current regimen; some highs at 7-9, but will trend 7-10: Highs associated  with nausea/emesis, improved with symptom management--looking much better 7/11-12 Vitals:   12/31/23 0552 12/31/23 1122 12/31/23 1420 12/31/23 2009  BP: (!) 140/79 133/64 (!) 140/82 122/69   12/31/23 2102 01/01/24 1447 01/01/24 1951 01/02/24 0520  BP: 122/69 (!) 141/82 (!) 141/76 136/69   01/02/24 1319 01/02/24 1923 01/02/24 2102 01/03/24 0520  BP: (!) 156/84 129/68 129/68 (!) 142/81    10. Dysphagia: NO straws to decrease aspiration events.  --GI consult recommended as retention of solids contribute to aspiration. Per Dr. Lizabeth note this was discussed with GI and who felt patient to be too high risk for GI procedures and recommended follow up after d/c. -SLP following --no complaints or concerns for aspiration this admission  11. Seizure d/o; On Lamictal  150 mg am and 200 mg pm-->educate on compliance.  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    13. Dyslipidemia: LDL-129 and HDL 27. On Lipitor 40 mg/day.   14. Frequency/Nocturia: Gets up at nights and pt/wife agreed to toileting at 11 pm and 3 am to prevent attempts out of bed.  7/4- pt refused these scheduled get up times, but fell- asked nursing to try and get him up for nocturia Continent of bladder  15. Spasticity - concerned about starting Any spasticity meds due to risk of sedation/confusion - suggest to be assessed for Botulinum toxin for LUE.  7-7: Minimal tone in left upper extremity, and functional strength in limb; perceived pout tone is mostly flexor posturing with left inattention, which he can break with volitional relaxation.  Feel recovery would be stymied by use of Botox at this time, continue conservative management.    16. L inattention/neglect-secondary to #1.  17. Bowels: no BM documented since  7/1 but pt states he's going daily, however history is limited d/t aphasia. Adamant that he had a BM yesterday. Asked nursing to please document.     - 7/7: Increase miralax  to BID, add sennakot S 1 tab BID 7-8: 2x bowel movements yesterday, reduce MiraLAX  to daily as needed -01/02/24 no BM documented in 3 days, will go back to scheduled miralax  daily -01/03/24 still no BM documented but pt very adamant he had a BM yesterday; will leave  meds as is for now, but would monitor closely  18.  Nausea/dizziness -recurrent, central vertigo versus gastric irritation from poor p.o. intake/high pill burden  7-7: Compazine  reduce to 5 mg p.o./IV due to somnolence.-->  2.5 mg p.o. as needed, with benefit 7-8  7-10: Discussed case with Dr. Buckley as above; hold off on MRI for now, interventions as follows   Protonix  40 mg twice daily for GI protection   Scheduled Zofran  8 mg every 8 hours; will leave Compazine  as as needed   Decadron  1 mg daily for 3 days   Scopolamine  patch every 72 hours--will hold off on initiating until a.m., to monitor improvement with other measures   IV fluids normal saline 75 cc/h tonight as above   PT vestibular evaluation ordered--will hold off for today given severity of symptoms   7-11: Doing much better with measures as described above, is eating today.  No further dizziness.  Will DC IV fluids, if any issues over the weekend would start scopolamine  patch and consider MRI brain.  -01/02/24 no complaints this morning, monitor     LOS: 10 days A FACE TO FACE EVALUATION WAS PERFORMED  [id number] 01/03/2024, 9:50 AM

## 2024-01-03 NOTE — Progress Notes (Signed)
 Patient continues to exhibit poor appetite and is not eating or drinking much despite encouragement from staff. No significant changes noted in intake. Will continue to monitor and encourage intake.

## 2024-01-03 NOTE — Progress Notes (Signed)
 Speech Language Pathology Daily Session Note  Patient Details  Name: Alan Wise MRN: [id number] Date of Birth: February 02, 1971  Today's Date: 01/03/2024 SLP Individual Time: 1315-1400 SLP Individual Time Calculation (min): 45 min  Short Term Goals: Week 1: SLP Short Term Goal 1 (Week 1): STGs = LTGs d/t ELOS  Skilled Therapeutic Interventions:   Pt was seen for skilled ST services targeting language therapy. Pt was asleep upon SLP entering room, but was agreeable to treatment. Pt was able to answer basic yes/no questions about his language strengths and weaknesses to assist in guiding treatment. SLP facilitated ease of verbal expression using automatic speech tasks. Pt was able to count 1-10 and verbalize DOW with minA verbal cues. Pt was able to verbalize every month given simultaneous production from therapist. SLP attempted to gradually fade co-production during [facility] which resulted in more errors from patient.  SLP demonstrated how automatic speech can be used to answer questions (I.e. When is your birthday?, what day is today?, how many kids do you have?). He required mod-maxA to utilize functionally. Pt was also tasked with sentence completion task for opposites - then challenged to verbalize the entire phrase. For example, up and ___ (down); then, up and down. Pt was able to complete this with minA verbal cues. Orthographic cues did not appear to be beneficial in today's treatment session.   Pt was left in room, with all immediate needs within reach, and all safety measures activated. Cont with current ST POC.   Pain Pain Assessment Pain Score: 0-No pain  Therapy/Group: Individual Therapy  Lacinda KANDICE Lorenzo 01/03/2024, 2:11 PM

## 2024-01-03 NOTE — Progress Notes (Signed)
 Patient was noted with coughing while taking medication whole with thin liquids. Had emesis episode this morning.    Durwood Garland      RN

## 2024-01-03 NOTE — Plan of Care (Signed)
  Problem: RH BLADDER ELIMINATION Goal: RH STG MANAGE BLADDER WITH ASSISTANCE Description: STG Manage Bladder With toileting Assistance Outcome: Progressing   Problem: RH SAFETY Goal: RH STG ADHERE TO SAFETY PRECAUTIONS W/ASSISTANCE/DEVICE Description: STG Adhere to Safety Precautions With cues Assistance/Device. Outcome: Progressing   Problem: RH KNOWLEDGE DEFICIT GENERAL Goal: RH STG INCREASE KNOWLEDGE OF SELF CARE AFTER HOSPITALIZATION Description: Patient and wife will be able to manage care at discharge using educational resources for medications and dietary modification independently Outcome: Progressing

## 2024-01-04 DIAGNOSIS — E43 Unspecified severe protein-calorie malnutrition: Secondary | ICD-10-CM

## 2024-01-04 DIAGNOSIS — I69391 Dysphagia following cerebral infarction: Secondary | ICD-10-CM

## 2024-01-04 DIAGNOSIS — G40909 Epilepsy, unspecified, not intractable, without status epilepticus: Secondary | ICD-10-CM

## 2024-01-04 LAB — CBC
HCT: 44.2 % (ref 39.0–52.0)
Hemoglobin: 15.6 g/dL (ref 13.0–17.0)
MCH: 29.4 pg (ref 26.0–34.0)
MCHC: 35.3 g/dL (ref 30.0–36.0)
MCV: 83.4 fL (ref 80.0–100.0)
Platelets: 213 K/uL (ref 150–400)
RBC: 5.3 MIL/uL (ref 4.22–5.81)
RDW: 12.6 % (ref 11.5–15.5)
WBC: 8.3 K/uL (ref 4.0–10.5)
nRBC: 0.2 % (ref 0.0–0.2)

## 2024-01-04 LAB — BASIC METABOLIC PANEL WITH GFR
Anion gap: 9 (ref 5–15)
BUN: 13 mg/dL (ref 6–20)
CO2: 28 mmol/L (ref 22–32)
Calcium: 9.8 mg/dL (ref 8.9–10.3)
Chloride: 100 mmol/L (ref 98–111)
Creatinine, Ser: 0.98 mg/dL (ref 0.61–1.24)
GFR, Estimated: >60 mL/min (ref >60)
Glucose, Bld: 98 mg/dL (ref 70–99)
Potassium: 3.6 mmol/L (ref 3.5–5.1)
Sodium: 137 mmol/L (ref 135–145)

## 2024-01-04 LAB — PREALBUMIN: Prealbumin: 23 mg/dL (ref 18–38)

## 2024-01-04 MED ORDER — MEGESTROL ACETATE 400 MG/10ML PO SUSP
400.0000 mg | Freq: Two times a day (BID) | ORAL | Status: DC
  Administered 2024-01-04 – 2024-01-11 (×15): 400 mg via ORAL
  Filled 2024-01-04 (×16): qty 10

## 2024-01-04 NOTE — Plan of Care (Signed)
  Problem: RH BLADDER ELIMINATION Goal: RH STG MANAGE BLADDER WITH ASSISTANCE Description: STG Manage Bladder With toileting Assistance Outcome: Progressing   Problem: RH SAFETY Goal: RH STG ADHERE TO SAFETY PRECAUTIONS W/ASSISTANCE/DEVICE Description: STG Adhere to Safety Precautions With cues Assistance/Device. Outcome: Progressing   Problem: RH KNOWLEDGE DEFICIT GENERAL Goal: RH STG INCREASE KNOWLEDGE OF SELF CARE AFTER HOSPITALIZATION Description: Patient and wife will be able to manage care at discharge using educational resources for medications and dietary modification independently Outcome: Progressing

## 2024-01-04 NOTE — Progress Notes (Addendum)
 PROGRESS NOTE   Subjective/Complaints:  Pt still doing well from a nausea standpoint but not eating much. Over the weekend he ate from 0-10% of meals. Wife is also concerned about his fall risk at home given that she also has children to look after. Feels that tomorrow is too soon to go home.  ROS: limited d/t aphasia   Objective:   No results found.   Recent Labs    01/04/24 0510  WBC 8.3  HGB 15.6  HCT 44.2  PLT 213    Recent Labs    01/04/24 0510  NA 137  K 3.6  CL 100  CO2 28  GLUCOSE 98  BUN 13  CREATININE 0.98  CALCIUM  9.8     Intake/Output Summary (Last 24 hours) at 01/04/2024 0933 Last data filed at 01/04/2024 0924 Gross per 24 hour  Intake 100 ml  Output --  Net 100 ml         Physical Exam: Vital Signs Blood pressure (!) 172/80, pulse (!) 59, temperature 98.6 F (37 C), resp. rate 15, height 5' 8.5 (1.74 m), weight 59.6 kg, SpO2 93%.    Constitutional: No distress . Vital signs reviewed. HEENT: NCAT, EOMI, oral membranes moist Neck: supple Cardiovascular: RRR without murmur. No JVD    Respiratory/Chest: CTA Bilaterally without wheezes or rales. Normal effort    GI/Abdomen: BS +, non-tender, non-distended Ext: no clubbing, cyanosis, or edema Psych: pleasant and cooperative  Neuro: L hemineglect and L sided weakness. Answers yes/no somewhat appropriately. Has word finding and sequencing issues.  Receptive deficits also Awake, alert Strength: RUE-5 out of 5 throughout LUE-3/5 shoulder abduction, biceps 4/5; triceps 4/5; WE 3/5; grip 4/5; FA 1-2/5 RLE- 5-/5 throughout LLE- 3-4 proximally, 4/5 distally---difficult to elicit consistent effort on exam. Decreased sensation to light touch throughout the left hemibody Cranial nerves: Left facial droop, left shoulder droop persistent Tone: No appreciable tone in left upper extremity; flexor synergy pattern--stable  Assessment/Plan: 1.  Functional deficits which require 3+ hours per day of interdisciplinary therapy in a comprehensive inpatient rehab setting. Physiatrist is providing close team supervision and 24 hour management of active medical problems listed below. Physiatrist and rehab team continue to assess barriers to discharge/monitor patient progress toward functional and medical goals  Care Tool:  Bathing    Body parts bathed by patient: Right arm, Left arm, Chest, Abdomen, Front perineal area, Right upper leg, Left upper leg, Right lower leg, Left lower leg, Face   Body parts bathed by helper: Buttocks, Right arm     Bathing assist Assist Level: Maximal Assistance - Patient 24 - 49%     Upper Body Dressing/Undressing Upper body dressing   What is the patient wearing?: Pull over shirt    Upper body assist Assist Level: Minimal Assistance - Patient > 75%    Lower Body Dressing/Undressing Lower body dressing      What is the patient wearing?: Underwear/pull up, Pants     Lower body assist Assist for lower body dressing: Minimal Assistance - Patient > 75%     Toileting Toileting    Toileting assist Assist for toileting: Moderate Assistance - Patient 50 - 74%  Transfers Chair/bed transfer  Transfers assist     Chair/bed transfer assist level: Contact Guard/Touching assist     Locomotion Ambulation   Ambulation assist      Assist level: Minimal Assistance - Patient > 75% Assistive device: No Device Max distance: 300'   Walk 10 feet activity   Assist     Assist level: Minimal Assistance - Patient > 75% Assistive device: No Device   Walk 50 feet activity   Assist    Assist level: Minimal Assistance - Patient > 75% Assistive device: No Device    Walk 150 feet activity   Assist    Assist level: Minimal Assistance - Patient > 75% Assistive device: No Device    Walk 10 feet on uneven surface  activity   Assist Walk 10 feet on uneven surfaces activity did not  occur: Safety/medical concerns         Wheelchair     Assist Is the patient using a wheelchair?: Yes      Wheelchair assist level: Dependent - Patient 0% Max wheelchair distance: 200 ft    Wheelchair 50 feet with 2 turns activity    Assist        Assist Level: Dependent - Patient 0%   Wheelchair 150 feet activity     Assist      Assist Level: Dependent - Patient 0%   Blood pressure (!) 172/80, pulse (!) 59, temperature 98.6 F (37 C), resp. rate 15, height 5' 8.5 (1.74 m), weight 59.6 kg, SpO2 93%.  Medical Problem List and Plan: 1. 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 PT improving. SLP appears similar to last admission - per SLP was making progress in OP prior to this - very limited frustration tolerance.   -7/7: Episode of extreme lethargy with left upper extremity movements concerning for seizure after receiving 10 mg of IV Compazine .  EEG with cortical dysfunction from the right frontotemporal region, postictal state, no epileptiform discharges--was in place during another similar event.  CT head unchanged.  Neurology consulted/curbside, likely combination of postictal and medication sensitivity, increasing Lamictal  and reduce Compazine  to 5 mg dosing.  Patient monitored throughout the afternoon, improved. 7-9: Will complete family training remotely due to difficulty coordinating family  7-10:Discussed case with Dr. Buckley; agree that patient is a challenging case with complex intracranial pathology, agree with MRI if nausea/dizziness does not improve in 2 to 3 days or if acute change in neurologic status.  Agree unlikely etiology of seizures.  Will treat with  adjustment of antiemetics and start Decadron  1 mg for 3 days; keep low dose.   7/14--dc planned for tomorrow. Given his poor po intake and recent issues with nausea his progress has been slow. Spoke with his wife at length. I'm willing to keep him a bit longer to work on po intake and to improve his balance and safety before sending him home. See nutrition discussion below  2.  Antithrombotics: -DVT/anticoagulation:  Pharmaceutical: Eliquis  5mg  BID- cannot stop             -antiplatelet therapy: N/A  3. Pain Management: Tylenol  PRN.   7-10: No complaints  of pain, headaches with nausea or emesis.  4. Mood/Behavior/Sleep: LCSW to follow for evaluation and support.              -antipsychotic agents: N/A  -Melatonin PRN  -Reports last IRF admission mom encouraged him to stop his seizure medication. No issues this admission, will watch closely.   -7-10: Intermittent frustration but overall cooperative and participatory  5. Neuropsych/cognition: This patient is not fully capable of making decisions on his own behalf.  6. Skin/Wound Care: Routine pressure relief measure.    7. Fluids/Electrolytes/severe protein calorie malnutrition: monitor I/O. Routine labs. Cont supplements.   - 7-7 labs stable; placed on 100 mL/h's continuous IV fluids for 24 hours due to nausea/somnolence--DC'd 7-8 7-10: Labs stable.  Will give IV fluids today 75 cc/h given ongoing nausea/retching, poor p.o. intakes. P.o. intakes limited by lack of interest in food, will consult nutrition for a.m. and consider appetite stimulant if antinausea medications ineffective.  See #18. 7/11: Nutrition consult completed, appreciate recommendations.  Notable patient has had 11% weight loss in the last 3 months.  7/14- still eating next to nothing.po intake recorded at 50cc yesterday   -BMET ok today, add on prealbumin   -begin trial of megace    -staff and family will need to push po   -?PEG 8. T2DM: Hgb A1c-5.4 and diet controlled.   -12/26/23 CBGs fine, not on SSI, no CBG checks ordered-- will tell nursing this is not needed.    9.  HTN: Monitor BP TID--avoid hypoperfusion. Continue Amlodipine  5 mg/day and propranolol  20 mg bid. Blood pressure well-controlled on current regimen; some highs at 7-9, but will trend 7-14improved Vitals:   12/31/23 2102 01/01/24 1447 01/01/24 1951 01/02/24 0520  BP: 122/69 (!) 141/82 (!) 141/76 136/69   01/02/24 1319 01/02/24 1923 01/02/24 2102 01/03/24 0520  BP: (!) 156/84 129/68 129/68 (!) 142/81   01/03/24 1320 01/03/24 1924 01/03/24 2106 01/04/24 0457  BP: 137/77 (!) 142/87 (!) 142/87 (!) 172/80    10. Dysphagia: NO straws to decrease aspiration events.  --GI consult recommended as retention of solids contribute to aspiration. Per Dr. Lizabeth note this was discussed with GI and who felt patient to be too high risk for GI procedures and recommended follow up after d/c. -SLP following --no complaints or concerns for aspiration this admission  11. Seizure d/o; On Lamictal  150 mg am and 200 mg pm-->educate on compliance.  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    13. Dyslipidemia: LDL-129 and HDL 27. On Lipitor 40 mg/day.   14. Frequency/Nocturia: Gets up at nights and pt/wife agreed to toileting at 11 pm and 3 am to prevent attempts out of bed.  7/4- pt refused these scheduled get up times, but fell- asked nursing to try and get him up for nocturia Continent of bladder  15. Spasticity - concerned about starting Any spasticity meds due to risk of sedation/confusion - suggest to be assessed for Botulinum toxin for LUE.  7-7: Minimal tone in left upper extremity, and functional strength in limb; perceived pout tone is mostly flexor posturing with left inattention, which  he can break with volitional relaxation.  Feel recovery would be stymied by use of Botox at this time, continue conservative management.    7/14 improved 16. L inattention/neglect-secondary  to #1.  17. Bowels: no BM documented since 7/1 but pt states he's going daily, however history is limited d/t aphasia. Adamant that he had a BM yesterday. Asked nursing to please document.     -   -01/02/24 no BM documented in 3 days, will go back to scheduled miralax  daily -01/03/24 still no BM documented but pt very adamant he had a BM yesterday; will leave meds as is for now, but would monitor closely  7/14 had bm 7/12. Eating nothing though, wouldn't expect much 18.  Nausea/dizziness -recurrent, central vertigo versus gastric irritation from poor p.o. intake/high pill burden  7-7: Compazine  reduce to 5 mg p.o./IV due to somnolence.-->  2.5 mg p.o. as needed, with benefit 7-8  7-10: Discussed case with Dr. Buckley as above; hold off on MRI for now, interventions as follows   Protonix  40 mg twice daily for GI protection   Scheduled Zofran  8 mg every 8 hours; will leave Compazine  as as needed   Decadron  1 mg daily for 3 days   Scopolamine  patch every 72 hours--will hold off on initiating until a.m., to monitor improvement with other measures   IV fluids normal saline 75 cc/h tonight as above   PT vestibular evaluation ordered--will hold off for today given severity of symptoms   7-11: Doing much better with measures as described above, is eating today.  No further dizziness.  Will DC IV fluids, if any issues over the weekend would start scopolamine  patch and consider MRI brain.  -01/04/24 no complaints again this morning, monitor. See nutrition discussion above     LOS: 11 days A FACE TO FACE EVALUATION WAS PERFORMED  Arthea ONEIDA Gunther 01/04/2024, 9:33 AM

## 2024-01-04 NOTE — Discharge Summary (Incomplete)
 Physician Discharge Summary  Patient ID: Alan Wise MRN: [id number] DOB/AGE: [birth year] 53 y.o.  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 EXAM: CT HEAD WITHOUT CONTRAST TECHNIQUE: Contiguous axial images were obtained from the base of the skull through the vertex without intravenous contrast. RADIATION DOSE REDUCTION: This exam was performed according to the departmental dose-optimization program which includes automated exposure control, adjustment of the mA and/or kV according to patient size and/or use of iterative reconstruction technique. COMPARISON:  CT of the head dated in December 24, 2023 an MRI of the brain dated December 23, 2023. FINDINGS: Brain: Areas of dystrophic calcification are again noted within the right frontal lobe and along the medial appended mole surface of the atrium of the right lateral ventricle. There is an evolving nonhemorrhagic infarct in fall thing the right cingulate gyrus, with decreased cytotoxic edema in the interim. There is no evidence of hemorrhagic conversion. Evolving nonhemorrhagic infarcts are also noted within the right corona radiata and basal ganglia. There is no significant mass effect or midline shift. Vascular: Moderate calcific atheromatous disease. Skull: Right frontal burr craniotomy sites.  Intact  otherwise. Sinuses/Orbits: Moderate mucosal disease within the ethmoid air cells and floor of the left frontal sinus. Normal orbits. Other: Clear mastoid air cells. IMPRESSION: 1. Evolving nonhemorrhagic infarcts involving the right cingulate gyrus and right basal ganglia. 2. Stable dystrophic calcification within the right frontal lobe and periatrial white matter from previously treated neoplasm. Electronically Signed   By: [name] M.D.   On: 12/28/2023 11:46   EEG adult Result Date: 12/28/2023 Alan Alan KIDD, MD     12/28/2023 11:26 AM Patient Name: Alan Wise MRN: [id number] Epilepsy Attending: Arlin Wise Alan Referring Physician/Provider: [name], PA-C Date: 12/28/2023 Duration: 21.50 mins Patient history: 53yo M with h/o epilepsy getting eeg to evaluate fors eizure Level of alertness: Awake, asleep AEDs during EEG study: LTG Technical aspects: This EEG study was done with scalp electrodes positioned according to the 10-20 International system of electrode placement. Electrical activity was reviewed with band pass filter of 1-70Hz , sensitivity of 7 uV/mm, display speed of 63mm/sec with a 60Hz  notched filter applied as appropriate. EEG data were recorded continuously and digitally stored.  Video monitoring was available and reviewed as appropriate. Description: The posterior dominant rhythm consists of 8-9 Hz activity of moderate voltage (25-35 uV) seen predominantly in posterior head regions, symmetric and reactive to eye opening and eye closing. Sleep was characterized by vertex waves, sleep spindles (12 to 14 Hz), maximal frontocentral region. EEG showed continuous 3 to 6 Hz theta-delta slowing in right frontotemporal region. Hyperventilation and photic stimulation were not performed.    ABNORMALITY - Continuous slow, right frontotemporal region  IMPRESSION: This study is suggestive of cortical dysfunction arising from right frontotemporal region likely secondary to underlying strokes,  postictal state. No seizures or epileptiform discharges were seen throughout the recording.  Priyanka O Yadav    VAS US  CAROTID Result Date: 12/25/2023 Carotid Arterial Duplex Study Patient Name:  Alan DRESSER  Wise  Date of Exam:   12/24/2023 Medical Rec #: [id number]          Accession #:    [id number] Date of Birth: [birth year]         Patient Gender: M Patient Age:   53 years Exam Location:  [facility] Procedure:      VAS US  CAROTID Referring Phys: DEVON SHAFER --------------------------------------------------------------------------------  Indications:       CVA. Risk Factors:      Hyperlipidemia, Diabetes. Other Factors:     GERD. Comparison Study:  No priors. Performing Technologist: Ricka Sturdivant-Jones RDMS, RVT  Examination Guidelines: A complete evaluation includes B-mode imaging, spectral Doppler, color Doppler, and power Doppler as needed of all accessible portions of each vessel. Bilateral testing is considered an integral part of a complete examination. Limited examinations for reoccurring indications may be performed as noted.  Right Carotid Findings: +----------+--------+--------+--------+------------------+------------------+           PSV cm/sEDV cm/sStenosisPlaque DescriptionComments           +----------+--------+--------+--------+------------------+------------------+ CCA Prox  104     16                                                   +----------+--------+--------+--------+------------------+------------------+ CCA Distal89      18                                intimal thickening +----------+--------+--------+--------+------------------+------------------+ ICA Prox  74      20      1-39%   homogeneous                          +----------+--------+--------+--------+------------------+------------------+ ICA Mid   66      18                                                   +----------+--------+--------+--------+------------------+------------------+  ICA Distal60      22                                                   +----------+--------+--------+--------+------------------+------------------+ ECA       118     11                                                   +----------+--------+--------+--------+------------------+------------------+ +----------+--------+-------+----------------+-------------------+           PSV cm/sEDV cmsDescribe        Arm Pressure (mmHG) +----------+--------+-------+----------------+-------------------+ [id number]            Multiphasic, WNL                    +----------+--------+-------+----------------+-------------------+ +---------+--------+--+--------+--+---------+ VertebralPSV cm/s39EDV cm/s13Antegrade +---------+--------+--+--------+--+---------+  Left Carotid Findings: +----------+--------+--------+--------+------------------+--------+           PSV cm/sEDV cm/sStenosisPlaque DescriptionComments +----------+--------+--------+--------+------------------+--------+ CCA Prox  111     15                                         +----------+--------+--------+--------+------------------+--------+ CCA Distal109     19                                         +----------+--------+--------+--------+------------------+--------+ ICA Prox  56      13      1-39%   calcific                   +----------+--------+--------+--------+------------------+--------+ ICA Mid   57      19                                         +----------+--------+--------+--------+------------------+--------+ ICA Distal56      21                                         +----------+--------+--------+--------+------------------+--------+ ECA       78      8                                          +----------+--------+--------+--------+------------------+--------+ +----------+--------+--------+----------------+-------------------+           PSV cm/sEDV cm/sDescribe        Arm  Pressure (mmHG) +----------+--------+--------+----------------+-------------------+ [id number]             Multiphasic, WNL                    +----------+--------+--------+----------------+-------------------+ +---------+--------+--+--------+--+---------+ VertebralPSV cm/s42EDV cm/s10Antegrade +---------+--------+--+--------+--+---------+   Summary: Right Carotid: Velocities in the right ICA are consistent with a 1-39% stenosis. Left Carotid: Velocities in the left ICA are consistent with a 1-39% stenosis. Vertebrals:  Bilateral vertebral arteries demonstrate antegrade flow. Subclavians: Normal flow hemodynamics were seen in bilateral subclavian              arteries. *See table(s) above for measurements and observations.  Electronically signed by Eather Popp MD on 12/25/2023 at 11:18:24 AM.    Final    CT HEAD WO CONTRAST ( ) Result Date: 12/25/2023 CLINICAL DATA:  Head trauma, minor, normal mental status (Age 64-64y) S/ FALL. EXAM: CT HEAD WITHOUT CONTRAST TECHNIQUE: Contiguous axial images were obtained from the base of the skull through the vertex without intravenous contrast. RADIATION DOSE REDUCTION: This exam was performed according to the departmental dose-optimization program which includes automated exposure control, adjustment of the mA and/or kV according to patient size and/or use of iterative reconstruction technique. COMPARISON:  CT head 12/22/2023.  MRI head 12/23/2023. FINDINGS: Brain: Similar oval vein right MCA and ACA territory infarcts without acute hemorrhage or progressive mass effect. Additional scattered white matter hypodensities, compatible with chronic microvascular ischemic disease. No hydrocephalus. Cerebral atrophy. Similar dystrophic calcifications in the right frontal and periatrial region. Vascular: No hyperdense vessel identified. Skull: No acute fracture. Sinuses/Orbits: Paranasal sinus mucosal thickening. No acute orbital findings. IMPRESSION: 1. Similar  right ACA and MCA territory infarcts  without progressive mass effect or acute hemorrhage. MRI could provide more sensitive evaluation for new/interval acute infarct if clinically warranted. 2. Similar appearance of treated tumor with dystrophic calcifications. Electronically Signed   By: Gilmore GORMAN Molt M.D.   On: 12/25/2023 00:19   DG Swallowing Func-Speech Pathology Result Date: 12/24/2023 Table formatting from the original result was not included. Modified Barium Swallow Study Patient Details Name: LEM PEARY MRN: [id number] Date of Birth: [birth year] Today's Date: 12/24/2023 HPI/PMH: HPI: 53 y.o. male presents [facility] 12/18/23 after falling two days prior with residual L sided weakness and somnolence. Brain MRI showed new patchy restricted diffusion in R basal ganglia, new punctuate focus of restricted diffusion in R caudate head. Recent admit 6/17-6/19 with anterior R temporal lobe punctate restricted diffusion. PMHx: T2DM, GERD, CVA w/ L sided weakness, oligodendroglioma Clinical Impression: Pt presents with a mild pharyngeal dysphagia and a primary esophageal dysphagia that is likely contributing to pharyngeal deficits. Recommend follow up with GI to determine if pt is a candidate for any intervention to aid esophageal clearance of solids and liquids. Oral phase judged to be WNL. Pharyngeal deficits characterized by reduced base of tongue retraction, occasional mistiming of epiglottic inversion (vs incomplete inversion) and laryngeal vestibule closure, which contributed to aspiration events. Reduced distention of the UES was the greatest pharyngeal concern. Concerns for esophageal dysphagia included moderate retention of pudding and solid in upper esophagus and backflow of thin liquids (by straw) through the UES. Retention of solids likely contributed to aspiration event of thin liquid wash that followed solid trials. During esophageal sweep, retention of liquids observed in the lower esophagus with some  retrograde flow.  This residue did eventually pass to the stomach. Findings: -All aspiration events were audible and minimal. -There was audible aspiration of nectar-thick liquids x1 by cup during the swallow, suspect due to mistiming of epiglottic inversion and laryngeal vestibule closure. -There was audible aspiration of thin liquid wash that followed solid trial. -There was pharyngeal residue primarily in the UES post swallow. Residue amount increased with increased viscosity. -special note: pt does have calcification of portions of the airway and suspected in portion of artery around the anterior upper esophagus. This is mentioned because calcified areas move with swallow initiation and it briefly looks like aspiration. Detailed diet recommendations and aspiration precautions outlined below. Despite aspiration of thin liquids and ongoing risk of aspiration, recommend continue this diet given pt has not exhibited concerns for pneumonia and pt is at a risk of aspirated nectar-thick liquids. Research shows the aspiration of thickened liquids can be more detrimental to the lungs; therefore, we will continue to monitor his tolerance of thin liquids closely. SLP will continue to follow for language and swallowing intervention. Factors that may increase risk of adverse event in presence of aspiration [name] & [name] 2021): No data recorded Recommendations/Plan: Swallowing Evaluation Recommendations Swallowing Evaluation Recommendations Recommendations: PO diet PO Diet Recommendation: Thin liquids (Level 0); Dysphagia 3 (Mechanical soft) Liquid Administration via: Cup; No straw Medication Administration: Whole meds with puree (consider crushing if he is having pill dysphagia) Supervision: Patient able to self-feed; Intermittent supervision/cueing for swallowing strategies Swallowing strategies  : Slow rate; Small bites/sips; Follow solids with liquids Postural changes: Position pt fully upright for meals; Stay upright  30-60 min after meals Oral care recommendations: Oral care BID (2x/day) Recommended consults: Consider GI consultation Treatment Plan Treatment Plan Treatment recommendations: Therapy as outlined in treatment plan below Follow-up recommendations: Acute inpatient rehab (3 hours/day) Functional status assessment: Patient  has had a recent decline in their functional status and demonstrates the ability to make significant improvements in function in a reasonable and predictable amount of time. Treatment frequency: Min 2x/week Treatment duration: 4 weeks Interventions: Aspiration precaution training; Diet toleration management by SLP; Compensatory techniques; Patient/family education; Respiratory muscle strength training Recommendations Recommendations for follow up therapy are one component of a multi-disciplinary discharge planning process, led by the attending physician.  Recommendations may be updated based on patient status, additional functional criteria and insurance authorization. Assessment: Orofacial Exam: Orofacial Exam Oral Cavity: Oral Hygiene: WFL Oral Cavity - Dentition: Adequate natural dentition Orofacial Anatomy: WFL Oral Motor/Sensory Function: WFL Anatomy: Anatomy: WFL Boluses Administered: Boluses Administered Boluses Administered: Thin liquids (Level 0); Mildly thick liquids (Level 2, nectar thick); Puree; Solid  Oral Impairment Domain: Oral Impairment Domain Lip Closure: No labial escape Tongue control during bolus hold: Cohesive bolus between tongue to palatal seal Bolus preparation/mastication: Timely and efficient chewing and mashing Bolus transport/lingual motion: Brisk tongue motion Oral residue: Trace residue lining oral structures Location of oral residue : Tongue Initiation of pharyngeal swallow : Posterior angle of the ramus  Pharyngeal Impairment Domain: Pharyngeal Impairment Domain Soft palate elevation: No bolus between soft palate (SP)/pharyngeal wall (PW) Laryngeal elevation: Complete  superior movement of thyroid  cartilage with complete approximation of arytenoids to epiglottic petiole Anterior hyoid excursion: Complete anterior movement Epiglottic movement: Partial inversion (varied; inconsistent) Laryngeal vestibule closure: Complete, no air/contrast in laryngeal vestibule Pharyngeal stripping wave : Present - diminished Pharyngeal contraction (A/P view only): Incomplete (pseudoviderticulae) Pharyngoesophageal segment opening: Partial distention/partial duration, partial obstruction of flow Tongue base retraction: Trace column of contrast or air between tongue base and PPW Pharyngeal residue: Collection of residue within or on pharyngeal structures Location of pharyngeal residue: Pyriform sinuses; Pharyngeal wall  Esophageal Impairment Domain: Esophageal Impairment Domain Esophageal clearance upright position: Esophageal retention with retrograde flow through the PES Pill: No data recorded Penetration/Aspiration Scale Score: Penetration/Aspiration Scale Score 1.  Material does not enter airway: Thin liquids (Level 0); Mildly thick liquids (Level 2, nectar thick); Puree; Solid 7.  Material enters airway, passes BELOW cords and not ejected out despite cough attempt by patient: Thin liquids (Level 0); Mildly thick liquids (Level 2, nectar thick) Compensatory Strategies: Compensatory Strategies Compensatory strategies: Yes Liquid wash: -- (partially effective for reducing residue in upper esophagus)   General Information: Caregiver present: No (follow up completed at bedside post study)  Diet Prior to this Study: Dysphagia 3 (mechanical soft); Thin liquids (Level 0)   Temperature : Normal   Respiratory Status: WFL   Supplemental O2: None (Room air)   History of Recent Intubation: No  Behavior/Cognition: Alert; Cooperative Self-Feeding Abilities: Able to self-feed Baseline vocal quality/speech: Normal No data recorded Volitional Swallow: Able to elicit Exam Limitations: No limitations Goal Planning:  Prognosis for improved oropharyngeal function: Good Barriers to Reach Goals: Language deficits No data recorded Patient/Family Stated Goal: improve communication; hopeful to get back to his baseline Consulted and agree with results and recommendations: Patient; Family member/caregiver; Physician Pain: Pain Assessment Pain Assessment: No/denies pain Faces Pain Scale: 2 Pain Location: L shoulder with mobility Pain Descriptors / Indicators: Discomfort; Aching Pain Intervention(s): Limited activity within patient's tolerance; Monitored during session; Other (comment) (ROM for stiffness) End of Session: Start Time:SLP Start Time (ACUTE ONLY): 0830 Stop Time: SLP Stop Time (ACUTE ONLY): 9141 Time Calculation:SLP Time Calculation (min) (ACUTE ONLY): 28 min Charges: SLP Evaluations $ SLP Speech Visit: 1 Visit SLP Evaluations $MBS Swallow: 1 Procedure $Speech  Treatment for Individual: 1 Procedure SLP visit diagnosis: SLP Visit Diagnosis: Aphasia (R47.01); Cognitive communication deficit (R41.841); Dysphagia, pharyngoesophageal phase (R13.14) Past Medical History: Past Medical History: Diagnosis Date  Allergy   Complication of anesthesia   pt reports he is starting to have more difficulty arousing after surgery  Diabetes mellitus (HCC)   GERD (gastroesophageal reflux disease)   Headache   History of kidney stones   Hyperlipidemia   Renal disorder   kidney stones Past Surgical History: Past Surgical History: Procedure Laterality Date  APPLICATION OF CRANIAL NAVIGATION Right 01/17/2021  Procedure: APPLICATION OF CRANIAL NAVIGATION;  Surgeon: Debby Dorn MATSU, MD;  Location: [facility] OR;  Service: Neurosurgery;  Laterality: Right;  COLONOSCOPY  09/03/1994  COLONOSCOPY WITH PROPOFOL  N/A 01/07/2023  Procedure: COLONOSCOPY WITH PROPOFOL ;  Surgeon: Jinny Carmine, MD;  Location: [facility] ENDOSCOPY;  Service: Endoscopy;  Laterality: N/A;  NOT TOO EARLY  CYSTOSCOPY WITH STENT PLACEMENT Right 01/25/2016  Procedure: CYSTOSCOPY WITH STENT PLACEMENT;   Surgeon: Redell Lynwood Napoleon, MD;  Location: [facility] ORS;  Service: Urology;  Laterality: Right;  FRAMELESS  BIOPSY WITH BRAINLAB Right 01/17/2021  Procedure: RIGHT STEREOTACTIC BIOPSY OF INSULAR LESION;  Surgeon: Debby Dorn MATSU, MD;  Location: [facility] OR;  Service: Neurosurgery;  Laterality: Right;  KNEE ARTHROSCOPY W/ MENISCAL REPAIR Right 06/23/1988  POLYPECTOMY  01/07/2023  Procedure: POLYPECTOMY;  Surgeon: Jinny Carmine, MD;  Location: [facility] ENDOSCOPY;  Service: Endoscopy;;  removal of birthmark  06/08/2013  SKIN CANCER EXCISION  06/23/2012  TYMPANOSTOMY TUBE PLACEMENT  08/06/1978  URETEROSCOPY WITH HOLMIUM LASER LITHOTRIPSY Right 01/25/2016  Procedure: URETEROSCOPY WITH HOLMIUM LASER LITHOTRIPSY;  Surgeon: Redell Lynwood Napoleon, MD;  Location: [facility] ORS;  Service: Urology;  Laterality: Right;  URETHRAL STRICTURE DILATATION    visual inspection of vocal cord    1973  WISDOM TOOTH EXTRACTION   [name] 12/24/2023, 12:12 PM  MR BRAIN WO CONTRAST Result Date: 12/23/2023 CLINICAL DATA:  53 year old male with punctate right temporal lobe infarct last month. Neurologic deficit, right basal ganglia infarct on 12/18/2023. EXAM: MRI HEAD WITHOUT CONTRAST TECHNIQUE: Multiplanar, multiecho pulse sequences of the brain and surrounding structures were obtained without intravenous contrast. COMPARISON:  Brain MRI 12/18/2023 and earlier. FINDINGS: Brain: Progressive restricted diffusion now beginning anterior to the right caudate and continuing through the nearby right ACA territory, confluent involvement along a 4-5 cm segment of the right cingulate (series 3, image 40). Unresolved diffusion restriction previously seen along the right frontal horn, and also in the posterior right corona radiata tracking toward the right lentiform, and also lateral to the atrium of the right lateral ventricle. Also punctate new restricted diffusion in the left lentiform series 3, image 30. T2 and FLAIR hyperintense cytotoxic edema in the  affected areas. Petechial hemorrhage on T2* imaging not significantly changed from previous SWI Heidelberg classification 1b: HI2, confluent petechiae, no mass effect. Pre-existing cystic encephalomalacia and hemosiderin along the anterior right MCA division also. Stable T2 and FLAIR hyperintense probable gliosis of the right insula there. Stable encephalomalacia with T2 shine through along the anterior the left genu of the corpus callosum. Chronic brainstem Wallerian degeneration, greater on the right. No midline shift, mass effect, evidence of mass lesion, acute ventriculomegaly, extra-axial collection. Cervicomedullary junction and pituitary are within normal limits. Vascular: Major intracranial vascular flow voids are stable. Skull and upper cervical spine: Negative. Visualized bone marrow signal is within normal limits. Sinuses/Orbits: Stable paranasal sinus mucosal thickening and opacification. Negative orbits. Other: Mastoids well aerated. IMPRESSION: 1. New acute and confluent mid  and distal Right ACA territory infarction. Punctate new left basal ganglia acute lacunar infarct. And ongoing multifocal right MCA deep white and deep gray matter ischemia otherwise not significantly changed from 12/18/2023. 2. Associated fatigue no hemorrhage. No malignant hemorrhagic transformation. No intracranial mass effect or midline shift. 3. Underlying chronic ischemic disease and encephalomalacia. Electronically Signed   By: VEAR Hurst M.D.   On: 12/23/2023 13:02   Overnight EEG with video Result Date: 12/23/2023 Alan Alan KIDD, MD     12/23/2023 12:49 PM Patient Name: STORMY SABOL MRN: [id number] Epilepsy Attending: Arlin Wise Alan Referring Physician/Provider: Judithe Rocky BROCKS, NP Duration: 12/22/2023 2256 to 12/23/2023 [id number] Patient history: 54 year old male with altered mental status.  Today had left gaze preference, left hemianopia and facial droop with waxing and waning aphasia and weakness.  EEG to evaluate for  seizure. Level of alertness: Awake, asleep AEDs during EEG study: LTG Technical aspects: This EEG study was done with scalp electrodes positioned according to the 10-20 International system of electrode placement. Electrical activity was reviewed with band pass filter of 1-70Hz , sensitivity of 7 uV/mm, display speed of 9mm/sec with a 60Hz  notched filter applied as appropriate. EEG data were recorded continuously and digitally stored.  Video monitoring was available and reviewed as appropriate. Description: The posterior dominant rhythm consists of 8-9 Hz activity of moderate voltage (25-35 uV) seen predominantly in posterior head regions, symmetric and reactive to eye opening and eye closing. Sleep was characterized by vertex waves, sleep spindles (12 to 14 Hz), maximal frontocentral region. EEG showed continuous 3 to 6 Hz theta-delta slowing in right frontotemporal region. Hyperventilation and photic stimulation were not performed.   ABNORMALITY - Continuous slow, right frontotemporal region IMPRESSION: This study is suggestive of cortical dysfunction arising from right frontotemporal region likely secondary to underlying strokes, postictal state. No seizures or epileptiform discharges were seen throughout the recording. Alan Wise Alan   MR ANGIO HEAD WO CONTRAST Result Date: 12/23/2023 CLINICAL DATA:  Neuro deficit, acute, stroke suspected EXAM: MRA HEAD WITHOUT CONTRAST TECHNIQUE: Angiographic images of the Circle of Willis were acquired using MRA technique without intravenous contrast. COMPARISON:  CT head from earlier today. FINDINGS: Anterior circulation: Hypoplastic left A1 ACA. Otherwise, bilateral intracranial ICAs, MCAs, and ACAs are patent. 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 Posterior circulation: Bilateral intradural vertebral arteries, basilar artery and bilateral posterior cerebral arteries are patent without proximal hemodynamically significant stenosis. Bilateral fetal type PCAs, anatomic  variant. IMPRESSION: 1. No emergent large vessel occlusion. 2. 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 Electronically Signed   By: Gilmore GORMAN Molt M.D.   On: 12/23/2023 00:48   CT HEAD WO CONTRAST ( ) Result Date: 12/22/2023 CLINICAL DATA:  Stroke, follow up EXAM: CT HEAD WITHOUT CONTRAST TECHNIQUE: Contiguous axial images were obtained from the base of the skull through the vertex without intravenous contrast. RADIATION DOSE REDUCTION: This exam was performed according to the departmental dose-optimization program which includes automated exposure control, adjustment of the mA and/or kV according to patient size and/or use of iterative reconstruction technique. COMPARISON:  CT head December 18, 2023. FINDINGS: Brain: Stable areas of dystrophic calcification encephalomalacia in the regions of prior treated malignancy including right frontal lobe and right periatrial region. Known infarcts were better characterized on recent MRI, but do not appear substantially changed when comparing across modalities. No progressive mass effect or acute hemorrhage. Vascular: No hyperdense vessel identified. Skull: No acute fracture. Sinuses/Orbits: Ethmoid air cell and frontal sinus mucosal thickening. No acute orbital  findings. Other: No mastoid effusions. IMPRESSION: 1. Known infarcts were better characterized on recent MRI, but do not appear substantially changed when comparing across modalities. No progressive mass effect or acute hemorrhage. 2. Similar appearance of treated tumor with dystrophic calcification. Electronically Signed   By: Gilmore GORMAN Molt M.D.   On: 12/22/2023 20:13   DG Shoulder Left Result Date: 12/19/2023 CLINICAL DATA:  [id number] Pain [id number] EXAM: LEFT SHOULDER - 2+ VIEW COMPARISON:  April 11, 2023 FINDINGS: No acute fracture or dislocation. There is no evidence of arthropathy or other focal bone abnormality. Soft tissues are unremarkable. IMPRESSION: No acute fracture or dislocation. Electronically Signed    By: Rogelia Myers M.D.   On: 12/19/2023 19:21   EEG adult Result Date: 12/19/2023 Alan Alan KIDD, MD     12/19/2023  4:51 PM Patient Name: AALIJAH LANPHERE MRN: [id number] Epilepsy Attending: Arlin Wise Alan Referring Physician/Provider: Noralee Elidia Sieving, MD Date: 12/19/2023 Duration: 27.36 mins Patient history: 53 y.o. 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 EEG to evaluate for seizure Level of alertness: Awake, asleep AEDs during EEG study: LTG Technical aspects: This EEG study was done with scalp electrodes positioned according to the 10-20 International system of electrode placement. Electrical activity was reviewed with band pass filter of 1-70Hz , sensitivity of 7 uV/mm, display speed of 58mm/sec with a 60Hz  notched filter applied as appropriate. EEG data were recorded continuously and digitally stored.  Video monitoring was available and reviewed as appropriate. Description: The posterior dominant rhythm consists of 8 Hz activity of moderate voltage (25-35 uV) seen predominantly in posterior head regions, symmetric and reactive to eye opening and eye closing. Sleep was characterized by vertex waves, sleep spindles (12 to 14 Hz), maximal frontocentral region. EEG showed continuous 3 to 6 Hz theta-delta slowing in right hemisphere, maximal right fronto-temporal region. Hyperventilation and photic stimulation were not performed.   ABNORMALITY - Continuous slow, right hemisphere, maximal right fronto-temporal region IMPRESSION: This study is suggestive of cortical dysfunction arising from right hemisphere, maximal right fronto-temporal region likely secondary to underlying structural abnormality. No seizures or definite epileptiform discharges were seen throughout the recording. Alan Wise Alan   MR BRAIN WO CONTRAST Result Date: 12/18/2023 CLINICAL DATA:  Provided history: History of stroke, acute worsening of  chronic left-sided weakness. Additional history obtained from prior radiology records: History of oligodendroglioma. EXAM: MRI HEAD WITHOUT CONTRAST TECHNIQUE: Multiplanar, multiecho pulse sequences of the brain and surrounding structures were obtained without intravenous contrast. COMPARISON:  Non-contrast head CT performed earlier today 12/18/2023. Non-contrast head CT and CT angiogram head/neck 12/09/2023. Prior brain MRI examinations 12/09/2023 and earlier. FINDINGS: Brain: Stable cerebral volume. New from the recent prior brain MRI 12/09/2023, there is a focus of patchy restricted diffusion within the right basal ganglia measuring 3.2 x 1.3 cm. There is also a new punctate focus of restricted diffusion within the right caudate head (series 2, image 25). These foci are favored to reflect acute infarcts. The small focus of restricted diffusion demonstrated within the superior right temporal lobe on the prior brain MRI is no longer present. Otherwise stable signal abnormality within the right frontal lobe/insula, extending to the right temporal lobe, region of the right deep gray nuclei, right periatrial white matter, anterior body/genu of corpus callosum and slightly into the adjacent anterior left frontal lobe white matter. Superimposed patchy and irregular foci of diffusion-weighted signal abnormality at these sites are otherwise unchanged and these findings are consistent with  a combination of tumor and post-treatment changes. Associated foci of dystrophic calcification and likely chronic hemosiderin deposition. Unchanged focus of chronic encephalomalacia/gliosis within the anterolateral right frontal lobe/insula. Redemonstrated tract (with associated chronic hemosiderin deposition) in the right frontal lobe, presumably from prior biopsy. Ex vacuo dilatation of the right lateral ventricle. Unchanged chronic T2 FLAIR hyperintense signal changes within the pons, nonspecific No extra-axial fluid collection. No  midline shift. Vascular: Maintained flow voids within the proximal large arterial vessels. Skull and upper cervical spine: No focal worrisome marrow lesion. Sinuses/Orbits: No mass or acute finding within the imaged orbits. Mucosal thickening within the bilateral frontal sinuses (mild right, moderate left). Severe bilateral ethmoid sinusitis. Mild mucosal thickening within the bilateral maxillary sinuses. Other: Trace fluid within left mastoid air cells. IMPRESSION: 1. 3.2 x 1.3 cm focus of patchy restricted diffusion within the right basal ganglia, new from the recent prior brain MRI of 12/09/2023. New punctate focus of restricted diffusion within the right caudate head. Given the appearance and rapid development, these findings are strongly favored to reflect acute infarcts. However, a short-interval follow-up brain MRI (in 6 weeks) is recommended to exclude any tumor progression. 2. A small focus of restricted diffusion demonstrated within the right temporal lobe on the prior MRI is no longer present. This likely reflected an acute infarct on the prior exam. 3. Otherwise stable appearance of the patient's known tumor and post-treatment changes. 4. Paranasal sinus disease as described. Electronically Signed   By: Rockey Childs D.O.   On: 12/18/2023 16:47   CT HEAD WO CONTRAST Result Date: 12/18/2023 CLINICAL DATA:  Neuro deficit, acute, stroke suspected. History of oligodendroglioma status post treatment. EXAM: CT HEAD WITHOUT CONTRAST TECHNIQUE: Contiguous axial images were obtained from the base of the skull through the vertex without intravenous contrast. RADIATION DOSE REDUCTION: This exam was performed according to the departmental dose-optimization program which includes automated exposure control, adjustment of the mA and/or kV according to patient size and/or use of iterative reconstruction technique. COMPARISON:  CT of the head dated December 09, 2023. FINDINGS: Brain: Moderate generalized is cerebral  volume loss. Curvilinear areas of calcification are again demonstrated within the periventricular and subcortical white matter of the right frontal lobe. Dystrophic calcifications are also seen anteromedial to the atrium of the right lateral ventricle. There are encephalomalacia changes and ex vacuo dilatation of the right lateral ventricle, which appears similar to the prior exam. There is no evidence of acute hemorrhage or infarction. Vascular: Moderate calcific atheromatous disease within the carotid siphons and right vertebral artery. Skull: Right frontal burr craniotomy defects. The calvaria is otherwise intact. No osseous lesions present. Sinuses/Orbits: Moderate mucosal disease within the frontal, ethmoid and maxillary sinuses. The orbits are unremarkable. Other: None. IMPRESSION: 1. Stable encephalomalacia changes and dystrophic calcification within the right cerebral hemisphere, compatible with previously treated neoplasm. No adverse interval change. 2. Mild-to-moderate paranasal sinus disease. Electronically Signed   By: [name] M.D.   On: 12/18/2023 15:37   DG Swallowing Func-Speech Pathology Result Date: 12/10/2023 Table formatting from the original result was not included. Modified Barium Swallow Study Patient Details Name: NAVARRO NINE MRN: [id number] Date of Birth: 28-Dec-1970 Today's Date: 12/10/2023 HPI/PMH: HPI: Per MD progress note, ISAISH ALEMU is a 53 y.o. Caucasian male with medical history significant for oligodendroglioma, GERD, type 2 diabetes mellitus, dyslipidemia and urolithiasis, presented to the emergency room with acute onset of altered mental status and recently diminished appetite over the last 4 days.  He saw his  PCP earlier this week and had an MRI as he has been having slowly increasing weakness and difficulty with his speech which has been more difficult to understand.  He has not been ambulating as usual.  He has been more fatigued and tired and somnolent.  MRI 12/09/23: New punctate focus of restricted diffusion in the anterior right  temporal lobe could represent a small acute infarct (favored) or  new/progressive small site of tumor.  2. Otherwise, unchanged appearance of multifocal signal abnormality  restricted diffusion in the right frontal and right temporal lobes  and corpus callosum, compatible with known tumor. CXR 6/17: No active disease. Clinical Impression: Clinical Impression: Pt seen for MBSS demonstrating mild pharyngoesophageal phase dysphagia. Pt presents with improvement in pharyngeal/esophageal phase of swallow in comparison with MBSS completed in March 2025. This date pt with continued residue at the UES and reduced distention of UES coupled with retention in the esophagus for more viscous substances- though no retrograde movement. Residue spilling into pyriform sinus from the UES space, though not continuing to the laryngeal vestibule. No overt airway compromise across consistencies. Cough noted x1 without evidence of penetration/aspiration. Oral phase grossly intact.     Despite lack of aspiration during study- given known choking and mild deficits related to UES distention/residue and reflux/retention- pt is at increased risk for aspiration, specifically with substances of increased viscosity. Trials of liquid wash and double swallow aided to reduce residue.     Continue to recommend dys 2 (chopped) solids to aid clearance and use of compensatory strategies- double swallow and liquid wash. Recommend aspiration precautions- slow rate, small bites, elevated HOB (for at least 30 min after swallow), and alert for PO intake. Pt and spouse aware of recommendations. RN and MD notified of results. Recommend follow up with OP SLP services (pt is currently on OP caseload). Factors that may increase risk of adverse event in presence of aspiration [name] & [name] 2021): Factors that may increase risk of adverse event in presence of aspiration [name] &  [name] 2021): Frail or deconditioned; Limited mobility (min deconditioned) Recommendations/Plan: Swallowing Evaluation Recommendations Swallowing Evaluation Recommendations Recommendations: PO diet PO Diet Recommendation: Dysphagia 2 (Finely chopped); Thin liquids (Level 0) Liquid Administration via: Cup; Straw Medication Administration: Whole meds with liquid Supervision: Patient able to self-feed; Intermittent supervision/cueing for swallowing strategies Swallowing strategies  : Slow rate; Small bites/sips; Follow solids with liquids Postural changes: Position pt fully upright for meals; Stay upright 30-60 min after meals Oral care recommendations: Oral care BID (2x/day) Treatment Plan Treatment Plan Treatment recommendations: Defer treatment plan to SLP at other venue (see follow-up recommendations) (follow up with OP SLP) Follow-up recommendations: Follow physicians's recommendations for discharge plan and follow up therapies (return to OP SLP services) Functional status assessment: -- (pt appears to be approaching baseline) Recommendations Recommendations for follow up therapy are one component of a multi-disciplinary discharge planning process, led by the attending physician.  Recommendations may be updated based on patient status, additional functional criteria and insurance authorization. Assessment: Orofacial Exam: Orofacial Exam Oral Cavity: Oral Hygiene: WFL Oral Cavity - Dentition: Adequate natural dentition Orofacial Anatomy: WFL Oral Motor/Sensory Function: WFL (pt indicating residual left orofacial weakness) Anatomy: Anatomy: WFL Boluses Administered: Boluses Administered Boluses Administered: Thin liquids (Level 0); Mildly thick liquids (Level 2, nectar thick); Puree; Solid  Oral Impairment Domain: Oral Impairment Domain Lip Closure: No labial escape Tongue control during bolus hold: Escape to lateral buccal cavity/floor of mouth Bolus preparation/mastication: Timely and efficient chewing and  mashing  Bolus transport/lingual motion: Brisk tongue motion Oral residue: Trace residue lining oral structures Location of oral residue : Tongue Initiation of pharyngeal swallow : Posterior angle of the ramus  Pharyngeal Impairment Domain: Pharyngeal Impairment Domain Soft palate elevation: No bolus between soft palate (SP)/pharyngeal wall (PW) Laryngeal elevation: Complete superior movement of thyroid  cartilage with complete approximation of arytenoids to epiglottic petiole Anterior hyoid excursion: Complete anterior movement Epiglottic movement: Complete inversion Laryngeal vestibule closure: Complete, no air/contrast in laryngeal vestibule Pharyngeal stripping wave : Present - complete Pharyngeal contraction (A/P view only): N/A Pharyngoesophageal segment opening: Partial distention/partial duration, partial obstruction of flow Tongue base retraction: No contrast between tongue base and posterior pharyngeal wall (PPW) Pharyngeal residue: Trace residue within or on pharyngeal structures Location of pharyngeal residue: Pyriform sinuses (trace)  Esophageal Impairment Domain: Esophageal Impairment Domain Esophageal clearance upright position: Esophageal retention Pill: Pill Consistency administered: -- (n/a) Penetration/Aspiration Scale Score: Penetration/Aspiration Scale Score 1.  Material does not enter airway: Thin liquids (Level 0); Mildly thick liquids (Level 2, nectar thick); Puree; Solid Compensatory Strategies: Compensatory Strategies Compensatory strategies: No   General Information: Caregiver present: No  Diet Prior to this Study: Thin liquids (Level 0); Dysphagia 2 (finely chopped)   Temperature : Normal   Respiratory Status: WFL   Supplemental O2: None (Room air)   History of Recent Intubation: No  Behavior/Cognition: Alert; Cooperative Self-Feeding Abilities: Able to self-feed Baseline vocal quality/speech: Normal Volitional Cough: Able to elicit Volitional Swallow: Able to elicit Exam Limitations: No  limitations Goal Planning: Prognosis for improved oropharyngeal function: -- (defer until completion of MBSS) No data recorded No data recorded Patient/Family Stated Goal: to eat as able Consulted and agree with results and recommendations: Patient; Family member/caregiver Pain: Pain Assessment Pain Assessment: No/denies pain End of Session: Start Time:SLP Start Time (ACUTE ONLY): 1150 Stop Time: SLP Stop Time (ACUTE ONLY): 1235 Time Calculation:SLP Time Calculation (min) (ACUTE ONLY): 45 min Charges: SLP Evaluations $ SLP Speech Visit: 1 Visit SLP Evaluations $BSS Swallow: 1 Procedure $MBS Swallow: 1 Procedure SLP visit diagnosis: SLP Visit Diagnosis: Dysphagia, pharyngoesophageal phase (R13.14) Past Medical History: Past Medical History: Diagnosis Date  Allergy   Complication of anesthesia   pt reports he is starting to have more difficulty arousing after surgery  Diabetes mellitus (HCC)   GERD (gastroesophageal reflux disease)   Headache   History of kidney stones   Hyperlipidemia   Renal disorder   kidney stones Past Surgical History: Past Surgical History: Procedure Laterality Date  APPLICATION OF CRANIAL NAVIGATION Right 01/17/2021  Procedure: APPLICATION OF CRANIAL NAVIGATION;  Surgeon: Debby Dorn MATSU, MD;  Location: [facility] OR;  Service: Neurosurgery;  Laterality: Right;  COLONOSCOPY  09/03/1994  COLONOSCOPY WITH PROPOFOL  N/A 01/07/2023  Procedure: COLONOSCOPY WITH PROPOFOL ;  Surgeon: Jinny Carmine, MD;  Location: [facility] ENDOSCOPY;  Service: Endoscopy;  Laterality: N/A;  NOT TOO EARLY  CYSTOSCOPY WITH STENT PLACEMENT Right 01/25/2016  Procedure: CYSTOSCOPY WITH STENT PLACEMENT;  Surgeon: Redell Lynwood Napoleon, MD;  Location: [facility] ORS;  Service: Urology;  Laterality: Right;  FRAMELESS  BIOPSY WITH BRAINLAB Right 01/17/2021  Procedure: RIGHT STEREOTACTIC BIOPSY OF INSULAR LESION;  Surgeon: Debby Dorn MATSU, MD;  Location: [facility] OR;  Service: Neurosurgery;  Laterality: Right;  KNEE ARTHROSCOPY W/ MENISCAL REPAIR Right  06/23/1988  POLYPECTOMY  01/07/2023  Procedure: POLYPECTOMY;  Surgeon: Jinny Carmine, MD;  Location: [facility] ENDOSCOPY;  Service: Endoscopy;;  removal of birthmark  06/08/2013  SKIN CANCER EXCISION  06/23/2012  TYMPANOSTOMY TUBE PLACEMENT  08/06/1978  URETEROSCOPY WITH HOLMIUM  LASER LITHOTRIPSY Right 01/25/2016  Procedure: URETEROSCOPY WITH HOLMIUM LASER LITHOTRIPSY;  Surgeon: Redell Lynwood Napoleon, MD;  Location: [facility] ORS;  Service: Urology;  Laterality: Right;  URETHRAL STRICTURE DILATATION    visual inspection of vocal cord    1973  WISDOM TOOTH EXTRACTION   [place] Jarrett Clapp, MS, CCC-SLP Speech Language Pathologist Rehab Services; [facility] [place] [phone] (ascom) [place] J Clapp 12/10/2023, 12:54 PM  CT ANGIO HEAD NECK W WO CM Result Date: 12/09/2023 CLINICAL DATA:  Stroke/TIA, determine embolic source. Altered mental status, behavioral change since yesterday, increased slurred speech and gait changes. EXAM: CT ANGIOGRAPHY HEAD AND NECK WITH AND WITHOUT CONTRAST TECHNIQUE: Multidetector CT imaging of the head and neck was performed using the standard protocol during bolus administration of intravenous contrast. Multiplanar CT image reconstructions and MIPs were obtained to evaluate the vascular anatomy. Carotid stenosis measurements (when applicable) are obtained utilizing NASCET criteria, using the distal internal carotid diameter as the denominator. RADIATION DOSE REDUCTION: This exam was performed according to the departmental dose-optimization program which includes automated exposure control, adjustment of the mA and/or kV according to patient size and/or use of iterative reconstruction technique. CONTRAST:  75mL OMNIPAQUE  IOHEXOL  350 MG/ML SOLN COMPARISON:  MRI head earlier same day. FINDINGS: CT HEAD FINDINGS Brain: No acute intracranial hemorrhage. Heterogeneous areas of mineralization within the right frontal lobe with associated edema corresponding to findings of tumor on same day MRI. Similar focus  of mineralization and edema within the right temporal periventricular white matter. Nonspecific hypoattenuation in the periventricular and subcortical white matter favored to reflect chronic microvascular ischemic changes. Generalized parenchymal volume loss. The basilar cisterns are patent. Posterior fossa is unremarkable. Ventricles: Ventricles are normal in size and configuration. Vascular: No hyperdense vessel. Intracranial atherosclerotic calcifications noted. Skull: No acute or aggressive finding. Sinuses/orbits: Orbits are symmetric. Mucosal thickening in the ethmoid sinuses, left greater than right. Other: Mastoid air cells are clear. CTA NECK FINDINGS Aortic arch: Standard configuration of the aortic arch. Imaged portion shows no evidence of aneurysm or dissection. No significant stenosis of the major arch vessel origins. Pulmonary arteries: As permitted by contrast timing, there are no filling defects in the visualized pulmonary arteries. Subclavian arteries: The subclavian arteries are patent bilaterally. Right carotid system: Patent. Mild atherosclerosis at the carotid bifurcation without hemodynamically significant stenosis. No evidence of dissection. Left carotid system: Patent. Mild atherosclerosis at the carotid bifurcation without hemodynamically significant stenosis. No evidence of dissection. Vertebral arteries: Codominant. No evidence of dissection, stenosis (50% or greater), or occlusion. Atherosclerosis at the left vertebral artery origin resulting in mild stenosis. Skeleton: No acute or aggressive finding noted. Other neck: The visualized airway is patent. No cervical lymphadenopathy. Upper chest: Visualized lung apices are clear. Review of the MIP images confirms the above findings CTA HEAD FINDINGS ANTERIOR CIRCULATION: The intracranial ICAs are patent bilaterally. Mild atherosclerosis of the carotid siphons. No significant stenosis, proximal occlusion, aneurysm, or vascular malformation.  MCAs: Patent bilaterally. Mild to moderate multifocal narrowing of an M2 inferior division branch of the right MCA. ACAs: Patent bilaterally. 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 There is severe narrowing of the A3 and proximal A4 segments of the right ACA. POSTERIOR CIRCULATION: No significant stenosis, proximal occlusion, aneurysm, or vascular malformation. PCAs: The PCAs are patent proximally. Fetal origin of both PCAs. There is severe stenosis and occlusion of the P3 segment of the right PCA. Few small caliber distal PCA branches noted likely perfused by collaterals. Pcomm: The posterior communicating  arteries are visualized bilaterally. SCAs: The superior cerebellar arteries are patent bilaterally. Basilar artery: Patent.  Distal tapering of the basilar artery. AICAs: Not well visualized. PICAs: Patent Vertebral arteries: The intracranial vertebral arteries are patent. Venous sinuses: As permitted by contrast timing, patent. Anatomic variants: None Review of the MIP images confirms the above findings IMPRESSION: No CT evidence of acute intracranial abnormality. Heterogeneous areas of mineralization and surrounding edema in the right frontal lobe and right temporal periventricular white matter corresponding to regions of tumor noted on same day MRI. 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 Intracranial arterial vasculature is otherwise patent. 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 Mild-to-moderate multifocal stenosis of an M2 inferior division branch of the right MCA. No high-grade stenosis of the arteries in the neck. Electronically Signed   By: Donnice Mania M.D.   On: 12/09/2023 13:48   MR Brain W and Wo Contrast Result Date: 12/09/2023 CLINICAL DATA:  Delirium had MRI about 1 week ago, since then has had increased difficulty speaking, R leg weakness, increased somnolence. eval for stroke, edema, tumor progression etc EXAM:  MRI HEAD WITHOUT AND WITH CONTRAST TECHNIQUE: Multiplanar, multiecho pulse sequences of the brain and surrounding structures were obtained without and with intravenous contrast. CONTRAST:  6mL GADAVIST  GADOBUTROL  1 MMOL/ML IV SOLN COMPARISON:  MRI head December 03, 2023. FINDINGS: Brain: New punctate focus of restricted diffusion in the anterior right temporal lobe (series 5, image 28). Resolution of the previously seen small focus of restricted diffusion in the right corona radiata. Additional areas of masslike restricted diffusion and signal abnormality involving the right frontal lobe, genu of the corpus callosum extending into the left frontal lobe, and right periatrial region, compatible with known tumor. More facet areas of T1 hyperintensity in the right frontal lobe right periatrial region are unchanged. No substantial mass effect or midline shift. No hydrocephalus. Similar cerebral atrophy. Vascular: Major arterial flow voids are maintained. Skull and upper cervical spine: Normal marrow signal. Sinuses/Orbits: Negative. IMPRESSION: 1. New punctate focus of restricted diffusion in the anterior right temporal lobe could represent a small acute infarct (favored) or new/progressive small site of tumor. 2. Otherwise, unchanged appearance of multifocal signal abnormality restricted diffusion in the right frontal and right temporal lobes and corpus callosum, compatible with known tumor. Electronically Signed   By: Gilmore GORMAN Molt M.D.   On: 12/09/2023 01:17   CT Head Wo Contrast Result Date: 12/08/2023 CLINICAL DATA:  Delirium altered EXAM: CT HEAD WITHOUT CONTRAST TECHNIQUE: Contiguous axial images were obtained from the base of the skull through the vertex without intravenous contrast. RADIATION DOSE REDUCTION: This exam was performed according to the departmental dose-optimization program which includes automated exposure control, adjustment of the mA and/or kV according to patient size and/or use of iterative  reconstruction technique. COMPARISON:  MRI 12/03/2023, CT brain 09/09/2023 FINDINGS: Brain: Amorphous hyperdensity in the right frontal and temporal lobes, progressive compared with head CT from March and favored to represent mineralization over hemorrhage, and corresponding to history of known brain lesions. White matter hypodensity again visible in the right frontal and temporal lobes as well as right insula. Areas of cystic encephalomalacia as before. Grossly stable size and configuration of the ventricles with ex vacuo dilatation on the right. Vascular: No hyperdense vessels.  Carotid vascular calcification Skull: No fracture. Sinuses/Orbits: No acute finding. Mild mucosal thickening in the sinuses Other: None IMPRESSION: Amorphous hyperdensity in the right frontal and temporal lobes, progressive  compared with head CT from March and favored to represent mineralization over hemorrhage, and corresponding to history of known brain lesions. Other grossly stable areas of white matter hypodensity as demonstrated on recent MRI imaging. Stable ventricle size and morphology compared to prior. Electronically Signed   By: Luke Bun M.D.   On: 12/08/2023 21:58   DG Chest Portable 1 View Result Date: 12/08/2023 CLINICAL DATA:  Altered mental status. EXAM: PORTABLE CHEST 1 VIEW COMPARISON:  September 09, 2023 FINDINGS: The heart size and mediastinal contours are within normal limits. Both lungs are clear. The visualized skeletal structures are unremarkable. IMPRESSION: No active disease. Electronically Signed   By: Suzen Dials M.D.   On: 12/08/2023 21:30    Labs:  Basic Metabolic Panel: Recent Labs  Lab 12/31/23 0555  NA 137  K 3.9  CL 101  CO2 27  GLUCOSE 88  BUN 11  CREATININE 0.96  CALCIUM  9.6    CBC: Recent Labs  Lab 12/31/23 0555  WBC 5.8  HGB 15.0  HCT 44.3  MCV 86.2  PLT 173    CBG: Recent Labs  Lab 12/28/23 1028  GLUCAP 138*   Family history.  Father with hyperlipidemia  hypertension diabetes mellitus.  Paternal grandmother with CVA.  Denies any colon cancer esophageal cancer or rectal cancer  Brief HPI:   JHAMIR PICKUP is a 53 y.o. 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  Dr. Buckley felt this was secondary to evolving radiation necrosis V/S novel small vessel infarcts and was changed to Eliquis  and Abilify  briefly added for ongoing behavior issues.  He was readmitted on 12/18/2023 with recurrent falls and left-sided weakness.  Repeat MRI of the brain revealed new 3.2 x 1.3 cm focus of right basal ganglia infarct and recent right temporal restricted diffusion no longer present.  EEG was negative for seizure and showed cortical dysfunction arising from right frontal temporal region likely due to underlying structural abnormality.  Neurology recommended increasing Lamictal  dose for possible partial seizures. He continued to have waxing and waning of baseline aphasia W/nonverbal state at times the left side weakness as well as left gaze preference with left hemianopsia and left facial droop on 7/01 per exam by Dr. Michaela.  Brain MRA was negative for LVO and showed severe right A3 and A4 ACA stenosis.  MRI of the brain repeated revealing progressive restricted diffusion in mid to distal right-ACA territory infarction, ongoing multifocal right MCA deep white matter and gray matter unchanged from 6/27 and underlying chronic ischemic disease with encephalomalacia.  Modified barium swallow revealed mild pharyngeal and primary esophageal dysphagia with  moderate retention in upper esophagus and all aspiration events were audible and minimal-D3, thins and no straws as well as GI consult to see if candidate for esophageal intervention.  Carotid Dopplers without significant ICA stenosis. Therapy consulted and had been working with patient on safety awareness (does not want to use assistive device), frustration tolerance, requires minimal to contact-guard due to shuffling gait with tendency to rush, expressive and receptive deficits requiring phenomic cues with improvement in spontaneous responses and impulsivity with gait, developing flexor tone left upper extremity required minimal assist with  OT.  MBS ordered as wife reported difficulty with swallowing.  Therapy evaluations completed and patient was admitted for a comprehensive rehab program.   Hospital Course: BARTOSZ LUGINBILL was admitted to rehab 12/24/2023 for inpatient therapies to consist of PT, ST and OT at least three hours five days a week. Past admission physiatrist, therapy team and rehab RN have worked together to provide customized collaborative inpatient rehab.  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  Followed by neurology services as well as medical oncology with history of Oligodendroglioma C/B radiation necrosis followed by Dr. Buckley.  Patient with episodes of lethargy left upper extremity movements concerning for seizure after recent dose of Compazine .  EEG with cortical dysfunction from the right frontal temporal region, postictal no epileptiform discharges-was in place during another event.  CT of the head unchanged.  Patient did have some spasticity concerned about starting any spasticity medication due to risk of sedation/confusion and advised conservative care no Botox at this time.  Neurology again consulted curbside likely combination of postictal medication sensitivity increasing Lamictal  and reduced Compazine .  Patient monitored  throughout sessions with close care by neurology services.  Case was discussed with Dr. Buckley agreed the patient challenging case with complex intracranial pathology.  Patient was started on Decadron  1 mg for 3 days and keeping low-dose.  He remained on chronic Eliquis  therapy no bleeding episodes.  Blood sugars monitored hemoglobin A1c 5.4.  Blood pressure somewhat soft continued on amlodipine  as well as propranolol  and would need outpatient follow-up.  Diet was slowly advanced to mechanical soft thin liquid.  Intermittent bouts of nausea/dizziness recurrent central vertigo versus gastric irritation from poor p.o. intake/high pill burden Compazine  as advised discussed again with Dr. Buckley with MRI of the brain completed 01/05/2024 demonstrating no new progressive process and patient continued on Protonix  40 mg twice daily scheduled Zofran  and Compazine  left as needed.   Blood pressures were monitored on TID basis and soft and monitored  Diabetes has been monitored with ac/hs CBG checks and SSI was use prn for tighter BS control.    Rehab course: During patient's stay in rehab weekly team conferences were held to monitor patient's progress, set goals and discuss barriers to discharge. At admission, patient required minimal assist contact-guard 120 feet quad cane contact-guard assist sit to stand  He/She  has had improvement in activity tolerance, balance, postural control as well as ability to compensate for deficits. He/She has had improvement in functional use RUE/LUE  and RLE/LLE as well as improvement in awareness.  Patient with slow progressive gains.  Working with energy conservation techniques.  Perform bed mobility slowly and with cues for sequencing and positioning at edge of bed.  Ambulates 150 feet to the gym with minimal assist at trunk to facilitate right lateral weight shifting to promote improved foot clearance on left.  Patient take seated rest breaks as needed.  He did have some bouts of  nausea vomiting while up with therapies.  At the end of sessions he was able to ambulate back to his room with contact-guard.  During ADLs he did need some encouragement at times participate.  He worked on prolonged static standing with functional reaching task isolating the left upper extremity.  He progressed to supervision contact-guard overall with ADLs and transfers.  During SLP sessions patient able to answer basic yes/no questions about his language strength and weaknesses to assist in guiding treatment.  SLP facilitated ease of verbal expression using automatic  speech tasks.  Patient was able to count 1-10 and verbalized DOW with minimal assist verbal cues.  Full family teaching completed and plan discharge to home.       Disposition:  Discharge disposition: 01-Home or Self Care        Diet: Mechanical soft thin liquids  Special Instructions: No driving smoking or alcohol  Medications at discharge 1.  Tylenol  as needed 2.  Norvasc  5 mg p.o. daily 3.  Eliquis  5 mg p.o. twice daily 4.  Lipitor 40 mg p.o. daily 5.  Lamictal  200 mg p.o. daily at 0800 and 200 mg nightly at 2200\ 6.  Antivert  12.5 mg p.o. twice daily as needed dizziness 7.  Melatonin 5 mg p.o. nightly as needed sleep 8.  Multivitamin daily 9.  Zofran  8 mg every 8 hours 10.  Protonix  40 mg p.o. twice daily 11.  MiraLAX  daily hold for loose stools 12.  Inderal  20 mg p.o. twice daily 13.  Compazine  2.5 mg every 8 hours as needed refractory nausea/vomiting.  Second line after Zofran   30-35 minutes were spent completing discharge summary and discharge planning  Discharge Instructions     Ambulatory referral to Occupational Therapy   Complete by: As directed    Eval and treat   Ambulatory referral to Physical Therapy   Complete by: As directed    Eval and treat   Ambulatory referral to Speech Therapy   Complete by: As directed    Eval and treat        Follow-up Information     Antonette Angeline ORN, NP  Follow up.   Specialties: Internal Medicine, Emergency Medicine Why: Call in 1-2 days for post hospital follow up Contact information: [address] [place] [address] [phone]         Emeline Search C, DO Follow up.   Specialty: Physical Medicine and Rehabilitation Why: office will call you with follow up appointment Contact information: [address] [place] [address] [phone]         Buckley Arthea POUR, MD Follow up.   Specialties: Psychiatry, Neurology, Oncology Why: call for appointment Contact information: [address] [place] [address] [phone]                 Signed: Toribio PARAS Hadas Jessop 01/04/2024, 5:04 AM

## 2024-01-04 NOTE — Progress Notes (Signed)
 Occupational Therapy Session Note  Patient Details  Name: BIJAN RIDGLEY MRN: [id number] Date of Birth: 12-12-1970  Today's Date: 01/04/2024 OT Individual Time: 0950-1030 OT Individual Time Calculation (min): 40 min  and Today's Date: 01/04/2024 OT Missed Time: 20 Minutes Missed Time Reason: Patient ill (comment)Nausea   Short Term Goals: Week 2:  OT Short Term Goal 1 (Week 2): Continue progressing toward LTGs  Skilled Therapeutic Interventions/Progress Updates:    Pt received supine with no c/o pain, agreeable to OT session. Met his wife in the hallway and discussed concerns re d/c at length and CLOF. He requested to take an emesis bag to the gym with him. Functional mobility to the gym, 120 ft with CGA. Pt completed standing level reciprocal tapping activity with no UE support using 2 8 in cones, 3x15 repetitions. Activity performed to challenge dynamic standing balance and functional activity tolerance to simulate household threshold management and reduce fall risk. Pt required min A overall but had one large, very slow LOB where he made little to no efforts to recover, leaning into OT and beginning to lower to the floor. Heavy cueing and encouragement and pt able to recover fall with mod A. Rest breaks required throughout session. Pt had sudden onset on nausea and spent several minutes dry heaving. He requested to return to the room. He was left supine, all needs met, bed alarm set. Attempted to discuss d/c planning and extension and pt was very upset, yelling no no no. Wife privately very thankful for extension to maximize pt independence at home.   Therapy Documentation Precautions:  Precautions Precautions: Fall Recall of Precautions/Restrictions: Intact Precaution/Restrictions Comments: L visual field cut and L hemipareisis at baseline, expressive > receptive aphasia Restrictions Weight Bearing Restrictions Per Provider Order: No Other Position/Activity Restrictions: L visual  field cut at baseline  Therapy/Group: Individual Therapy  [name] 01/04/2024, 10:06 AM

## 2024-01-04 NOTE — Progress Notes (Signed)
 Patient ID: Alan Wise, male   DOB: 12/27/1970, 53 y.o.   MRN: [id number]  SW met with pt wife in SW office who expressed concerns about her husband's discharge tomorrow. States that she is afraid of his unsteadiness and that he is not eating.   While in room, SW shared medical team has been discussing an extension, and his new d/c date is now 7/18. She is appreciative. She is willing to meet with therapists one-on-one to discuss suggestions/recs on how to care for pt at home due to his behaviors.   Graeme Jude, MSW, LCSW Office: [phone] Cell: [phone] Fax: [phone]

## 2024-01-04 NOTE — Progress Notes (Signed)
 Physical Therapy Weekly Progress Note  Patient Details  Name: Alan Wise MRN: [id number] Date of Birth: 04/26/1971  Beginning of progress report period: December 25, 2023 End of progress report period: January 04, 2024  Today's Date: 01/04/2024 PT Individual Time: 0803-0858 PT Individual Time Calculation (min): 55 min  and Today's Date: 01/04/2024 PT Missed Time: 30 Minutes Missed Time Reason: Patient fatigue  Patient has met 0 of 0  short term goals.  Short term goals not set for patient due to original expected length of stay. Pt has made slow progress toward mobility goals and has been limited by ongoing issues with nausea, vomiting, fatigue, and malaise. Pt is performing mobility at minA level overall, but is a vary high fall risk and will benefit from family educaiton prior to discharge.  Patient continues to demonstrate the following deficits muscle weakness, decreased cardiorespiratoy endurance, decreased coordination and decreased motor planning, delayed processing, and decreased sitting balance, decreased standing balance, decreased postural control, hemiplegia, and decreased balance strategies and therefore will continue to benefit from skilled PT intervention to increase functional independence with mobility.  Patient progressing toward long term goals..  Continue plan of care.  PT Short Term Goals Week 1:  PT Short Term Goal 1 (Week 1): STG = LTG d/t ELOS Week 2:  PT Short Term Goal 1 (Week 2): STGs = LTGs  Skilled Therapeutic Interventions/Progress Updates:     1st Session: Pt received semi recline din bed and agrees to therapy. Supine to sit slowly with bed features and cues for positioning at EOB. PT assists to don shoes prior to mobility. Pt performs sit to stand with cues for initiation and CGA. Pt ambulates x300' without AD, with minA at shoulders and hips to facilitate upright posture as well as promote Rt lateral weight shifting to clear Lt foot during swing phase, which  pt routinely drags. Following rest break, pt ambulates additional x300' with same assistance and cues, and completes ramp navigation and car transfer with minA and cues for sequencing. Following rest break, pt ambulates to main gym, x150'. Seated rest break. Pt completes x12 6 steps with Rt hand rail and CGA, with cues for step sequencing and safety. Pt picks up object from floor with CGA. Pt ambulates back to room. Left supine in bed with all needs within reach.   2nd Session: Pt asleep upon arrival. PT will follow up as able.   Therapy Documentation Precautions:  Precautions Precautions: Fall Recall of Precautions/Restrictions: Intact Precaution/Restrictions Comments: L visual field cut and L hemipareisis at baseline, expressive > receptive aphasia Restrictions Weight Bearing Restrictions Per Provider Order: No Other Position/Activity Restrictions: L visual field cut at baseline  Therapy/Group: Individual Therapy  [name], PT, DPT 01/04/2024, 4:01 PM

## 2024-01-04 NOTE — Progress Notes (Signed)
 Speech Language Pathology Daily Session Note  Patient Details  Name: Alan Wise MRN: [id number] Date of Birth: 01-12-1971  Today's Date: 01/04/2024 SLP Individual Time: 1100-1120 SLP Individual Time Calculation (min): 20 min and Today's Date: 01/04/2024 SLP Missed Time: 40 Minutes Missed Time Reason: Patient ill (Comment);Patient fatigue  Short Term Goals: Week 1: SLP Short Term Goal 1 (Week 1): STGs = LTGs d/t ELOS  Skilled Therapeutic Interventions:   Pt and his wife greeted at bedside. He was asleep upon SLP arrival, and his wife reported emesis episode w/ OT prior to ST tx session. Pt's wife reported that pt would likely not feel up for ST. Pt did attempt to wake twice but was unable to remain awake. During this time, SLP provided education to his wife re progress thus far, self paced language tasks for pt to complete at home, recommendations for OP ST, as well as some food options to encourage PO intake and maintenance of nutritional needs. She verbalized understanding of education and additional questions were answered. He was left in his bed with the alarm set and his wife present upon SLP departure. Recommend cont ST per POC.  Pain  No pain reported. Sleeping upon SLP arrival - appeared comfortable  Therapy/Group: Individual Therapy  [name] 01/04/2024, 1:19 PM

## 2024-01-05 ENCOUNTER — Inpatient Hospital Stay (HOSPITAL_COMMUNITY)

## 2024-01-05 NOTE — Progress Notes (Signed)
 SLP Cancellation Note  Patient Details Name: Alan Wise MRN: [id number] DOB: Oct 24, 1970   Cancelled treatment: Attempted to see pt for scheduled tx time, however, pt was in MRI. Will return as able.   [name] 01/05/2024, 2:57 PM

## 2024-01-05 NOTE — Progress Notes (Signed)
 Patient ID: Alan Wise, male   DOB: Aug 21, 1970, 53 y.o.   MRN: [id number]  SW met with pt wife to provide updates from team conference, and d/c date remains 7/18. SW shared will discuss with team if outpatient is still needed. She requests HH due to all of his recent changes. SW provided [facility] list and will follow-up about preference.   Graeme Jude, MSW, LCSW Office: [phone] Cell: [phone] Fax: [phone]

## 2024-01-05 NOTE — Plan of Care (Signed)
  Problem: RH Expression Communication Goal: LTG Patient will express needs/wants via multi-modal(SLP) Description: LTG:  Patient will express needs/wants via multi-modal communication (gestures/written, etc) with cues (SLP) Flowsheets (Taken 01/05/2024 1939) LTG: Patient will express needs/wants via multimodal communication (gestures/written, etc) with cueing (SLP): Maximal Assistance - Patient 25 - 49%   Problem: RH Swallowing Goal: LTG Patient will consume least restrictive diet using compensatory strategies with assistance (SLP) Description: LTG:  Patient will consume least restrictive diet using compensatory strategies with assistance (SLP) Flowsheets (Taken 01/05/2024 1939) LTG: Pt Patient will consume least restrictive diet using compensatory strategies with assistance of (SLP): Moderate Assistance - Patient 50 - 74%

## 2024-01-05 NOTE — Plan of Care (Signed)
 Goals downgraded d/t fluctuating performance  Problem: RH Balance Goal: LTG Patient will maintain dynamic standing with ADLs (OT) Description: LTG:  Patient will maintain dynamic standing balance with assist during activities of daily living (OT)  Flowsheets (Taken 01/05/2024 1027) LTG: Pt will maintain dynamic standing balance during ADLs with: (downgraded d/t fluctuating performance - SD) Contact Guard/Touching assist   Problem: RH Dressing Goal: LTG Patient will perform lower body dressing w/assist (OT) Description: LTG: Patient will perform lower body dressing with assist, with/without cues in positioning using equipment (OT) Flowsheets (Taken 01/05/2024 1027) LTG: Pt will perform lower body dressing with assistance level of: (downgraded d/t fluctuating performance - SD) Contact Guard/Touching assist   Problem: RH Toileting Goal: LTG Patient will perform toileting task (3/3 steps) with assistance level (OT) Description: LTG: Patient will perform toileting task (3/3 steps) with assistance level (OT)  Flowsheets (Taken 01/05/2024 1027) LTG: Pt will perform toileting task (3/3 steps) with assistance level: (downgraded d/t fluctuating performance - SD) Contact Guard/Touching assist   Problem: RH Toilet Transfers Goal: LTG Patient will perform toilet transfers w/assist (OT) Description: LTG: Patient will perform toilet transfers with assist, with/without cues using equipment (OT) Flowsheets (Taken 01/05/2024 1027) LTG: Pt will perform toilet transfers with assistance level of: (downgraded d/t fluctuating performance - SD) Contact Guard/Touching assist   Problem: RH Tub/Shower Transfers Goal: LTG Patient will perform tub/shower transfers w/assist (OT) Description: LTG: Patient will perform tub/shower transfers with assist, with/without cues using equipment (OT) Flowsheets (Taken 01/05/2024 1027) LTG: Pt will perform tub/shower stall transfers with assistance level of: (downgraded d/t  fluctuating performance - SD) Contact Guard/Touching assist

## 2024-01-05 NOTE — Patient Care Conference (Signed)
 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 Joesph Likes Social Worker Present: Graeme Jude, LCSW Nurse Present: Eulalio Falls, RN PT Present: Kirt Dawn, PT OT Present: Nena Moats, OT SLP Present: Recardo Mole, SLP     Current Status/Progress Goal Weekly Team Focus  Bowel/Bladder   Pt is currently continent with B/B   LBM 01/02/24 per pt   Will remain continent with normal pattern   Assist with toilet needs and monitor pt for constipation    Swallow/Nutrition/ Hydration   Dys 3/thin   supervision  pt/family education, PO trials, diet tolerance, PO intake    ADL's   CGA bathing and dressing. Still requires cueing for L attention and inclusion of the LUE. CGA- (S) transfers and mobility. L UE tone improved. High fall risk with poor insight into deficits   Supervision   L NMR, d/c planning, ADLs, transfers    Mobility   supervision to CGA all mobility, continues to require occasional minA to even modA/maxA with LOBs   Supervision  DC prep, family ed, balance and ambulation    Communication   severe expressive and moderate receptive deficits remain   modA comprehension, maxA expression   pt/family education, automatic speech tasks, functional language     Safety/Cognition/ Behavioral Observations  L visual attention, limited frustration tolerance, impulsive   modA   pt/family education, cognitive re training    Pain   Denies pain at this time   Will be free from pain   Assess pt for pain qshift/prn    Skin   Skin is currently intact   Will maintain skin intergrity with no breakdown  Assess skin and assist with replosition to prevent skin breakdown      Discharge Planning:  Pt will d/c to home with his wife. Fam edu attempted but behaviors; one-on-one with wife within each discpline and needs at home. SW will confirm there are no barriers to discharge.    Team Discussion: Patient post right ACA CVA with left hemi-neglect UE and visual deficits with a history of seizures. Patient has nausea, dizziness and poor appetite.  Patient limited by severe expressive aphasia, moderate receptive aphasia, left attention deficits, fatigue, loss of balance at the end of therapy sessions and low frustration tolerance. Patient is a high fall risk with poor insight to deficits.   Patient on target to meet rehab goals: Currently patient needs CGA with bathing and dressing. Patient needs CGA-S with transfers and mobility. Per SLP,  patient has no progress with receptive aphasia and left attention deficits. Overall goals for discharge are set for supervision and mod-max  assist with comprehension and expression.   *See Care Plan and progress notes for long and short-term goals.   Revisions to Treatment Plan:  Begin Megace  trial MRI brain   Teaching Needs: Safety, medications, toileting , transfers, etc.   Current Barriers to Discharge: Decreased caregiver support and Nutritional means  Possible Resolutions to Barriers: Family Education OP follow-up services     Medical Summary Current Status: Medically complicated by CVA with left hemiparesis, moderate to severe protein calorie malnutrition / Poor PO intakes, dizziness, nausea,  oligodendroglioma, seizures, hypertension, nocturia, and constipation  Barriers to Discharge: Behavior/Mood;Medical stability;Pending chemo/radiation;Self-care education;Uncontrolled Pain   Possible Resolutions to [employer]: monitor p.o. intakes and encourage fluids/provide supplementation as needed, titrate antiemetics and appetite stimulants, monitor vitals and adjust BP meds as necessary, coordinate with neuro-oncology on patient care   Continued Need for Acute Rehabilitation Level of Care: The patient requires daily medical management by a physician with specialized training in physical medicine and rehabilitation for the following reasons: Direction of a multidisciplinary physical rehabilitation program to maximize functional independence : Yes Medical management of patient stability for increased activity during participation in an intensive rehabilitation regime.: Yes Analysis of laboratory values and/or radiology reports with any subsequent need for medication adjustment and/or medical intervention. : Yes   I attest that I was present, lead the team conference, and concur with the assessment and plan of the team.   Alan Wise 01/05/2024, 1031 am

## 2024-01-05 NOTE — Progress Notes (Signed)
 Occupational Therapy Session Note  Patient Details  Name: Alan Wise MRN: [id number] Date of Birth: 08-17-1970  Today's Date: 01/05/2024 OT Individual Time: 1110-1207 OT Individual Time Calculation (min): 57 min    Short Term Goals: Week 2:  OT Short Term Goal 1 (Week 2): Continue progressing toward LTGs  Skilled Therapeutic Interventions/Progress Updates:    Pt received supine with no c/o pain, agreeable to OT session. Pt with very low frustration tolerance today and upset with OT throughout session, esp when encouraging independence. He completed ambulatory transfer into the bathroom with CGA, reaching out for doorways more than usual. He doffed clothes in standing with mod A. He required mod cueing for initiating bathing. When OT encouraged him to wash his bottom he became very frustrated. He returned to the EOB and required cueing to initiate dressing. Again, very poor frustration tolerance limited his effort and problem solving with dressing. He required mod cueing and min A to don shirt. He required mod A to don pants. OT donned socks and shoes to minimize frustration tolerance. He completed 125 ft of functional mobility to the therapy gym with CGA d/t worse L foot dragging. His L UE was also being held in flexion and when OT cued him to straighten he stated NO. He completed 2 sets of 4 stair training to address BLE coordination and dynamic standing balance. With RUE support he required close (S). He took a seated rest break on the mat and had on set of nausea. He had no emesis or actual dry heaving but stayed hunched over the trash can for several minutes. He returned to his room and transferred back to bed with min A d/t rushing to get to EOB. He was left supine with all needs met, wife given overview of session. Notified RN and DO of nausea and performance.   Therapy Documentation Precautions:  Precautions Precautions: Fall Recall of Precautions/Restrictions:  Intact Precaution/Restrictions Comments: L visual field cut and L hemipareisis at baseline, expressive > receptive aphasia Restrictions Weight Bearing Restrictions Per Provider Order: No Other Position/Activity Restrictions: L visual field cut at baseline   Therapy/Group: Individual Therapy  [name] 01/05/2024, 11:36 AM

## 2024-01-05 NOTE — Plan of Care (Signed)
  Problem: RH BLADDER ELIMINATION Goal: RH STG MANAGE BLADDER WITH ASSISTANCE Description: STG Manage Bladder With toileting Assistance Outcome: Progressing   Problem: RH SAFETY Goal: RH STG ADHERE TO SAFETY PRECAUTIONS W/ASSISTANCE/DEVICE Description: STG Adhere to Safety Precautions With cues Assistance/Device. Outcome: Progressing   Problem: RH KNOWLEDGE DEFICIT GENERAL Goal: RH STG INCREASE KNOWLEDGE OF SELF CARE AFTER HOSPITALIZATION Description: Patient and wife will be able to manage care at discharge using educational resources for medications and dietary modification independently Outcome: Progressing

## 2024-01-05 NOTE — Progress Notes (Signed)
 Speech Language Pathology Daily Session Note  Patient Details  Name: NEILL JUREWICZ MRN: [id number] Date of Birth: [birth year]  Today's Date: 01/05/2024 SLP Individual Time: 8399-8384 SLP Individual Time Calculation (min): 15 min  Short Term Goals: Week 1: SLP Short Term Goal 1 (Week 1): STGs = LTGs d/t ELOS  Skilled Therapeutic Interventions:   Pt and his wife greeted at bedside to discuss diet recommendations. Of note, did speak w/ pt's wife earlier re pocketing of food, which is new as of the last 2 days. She was agreeable to downgrade to Dys2 textures given increased oral dysphagia and limited PO intake overall. When speaking w/ the patient, however, he quickly became frustrated w/ SLP and repeated no no no throughout education and conversation re aspiration precautions. Would still recommend Dys 2 diet and thin liquids as the safest diet at this time, however, given pt's emotional reaction to suggested diet change and desire to maintain quality of life at this time, did not downgrade diet at this time. Pt verbalized yes to verbalize understanding that he desires to go against medical advice (Dys 2 diet/thin liquids). Conversation discontinued d/t pt frustration. He was left in his bed with the alarm set and call light within reach. Wife remained present upon SLP arrival. Recommend cont ST per POC.   Pain  No pain reported  Therapy/Group: Individual Therapy  [name] 01/05/2024, 6:56 PM

## 2024-01-05 NOTE — Progress Notes (Signed)
 Speech Language Pathology Weekly Progress and Session Note  Patient Details  Name: Alan Wise MRN: [id number] Date of Birth: 30-Nov-1970  Beginning of progress report period: December 25, 2023 End of progress report period: January 05, 2024   Short Term Goals: Week 1: SLP Short Term Goal 1 (Week 1): STGs = LTGs d/t ELOS SLP Short Term Goal 1 - Progress (Week 1): Progressing toward goal    New Short Term Goals: Week 2: SLP Short Term Goal 1 (Week 2): STGs = LTGs d/t ELOS  Weekly Progress Updates:  Progress remains limited by severity of deficits, medical complications, and poor frustration tolerance. Severe expressive and moderate receptive aphasia remain. Additionally, this week, oral pocketing was noted. Recommend Dys 2 textures d/t this, however, pt refused. See note from 7/15 for more info. Will add dysphagia goal to POC to ensure diet tolerance @ least restrictive diet. Pt/family education ongoing. He would benefit from continued ST to target aforementioned deficits, maximize pt communication/swallow safety, and reduce caregiver burden.    Intensity: Minumum of 1-2 x/day, 30 to 90 minutes Frequency: 3 to 5 out of 7 days Duration/Length of Stay: 7/18 Treatment/Interventions: Cognitive remediation/compensation;Dysphagia/aspiration precaution training;Internal/external aids;Speech/Language facilitation;Cueing hierarchy;Environmental controls;Therapeutic Activities;Therapeutic Exercise;Patient/family education;Multimodal communication approach;Functional tasks    [name] 01/05/2024, 7:29 PM

## 2024-01-05 NOTE — Progress Notes (Signed)
  [facility] Progress Note  Patient Care Team: Antonette Angeline ORN, NP as PCP - General (Internal Medicine) Homsher, Wanda, RN as VBCI Care Management  CHIEF COMPLAINTS/PURPOSE OF CONSULTATION:  Glioma Stroke  INTERVAL HISTORY:  Alan Wise 53 y.o. male has been experiencing nausea/vomiting for several days.  This seems to occur during the day moreso than at night, often following rehab/gym sessions.  Appetite has been very poor, little PO intake in recent days.  He is drinking some ensure and eating some ice cream.  He is able to swallow pills with liquid, but is having some difficulty with solid food.  Decadron  was resumed today, with no change in symptoms thus far.    Initially presented with 1-2days of progressive left face, arm and leg weakness.  Found to have stroke on MRI, currently with limited use of his left side.  Language/speech has also been more difficult during this time.    MEDICAL HISTORY:  Past Medical History:  Diagnosis Date   Allergy    Complication of anesthesia    pt reports he is starting to have more difficulty arousing after surgery   Diabetes mellitus (HCC)    GERD (gastroesophageal reflux disease)    Headache    History of kidney stones    Hyperlipidemia    Renal disorder    kidney stones    SURGICAL HISTORY: Past Surgical History:  Procedure Laterality Date   APPLICATION OF CRANIAL NAVIGATION Right 01/17/2021   Procedure: APPLICATION OF CRANIAL NAVIGATION;  Surgeon: Debby Dorn MATSU, MD;  Location: [facility] OR;  Service: Neurosurgery;  Laterality: Right;   COLONOSCOPY  09/03/1994   COLONOSCOPY WITH PROPOFOL  N/A 01/07/2023   Procedure: COLONOSCOPY WITH PROPOFOL ;  Surgeon: Jinny Carmine, MD;  Location: [facility] ENDOSCOPY;  Service: Endoscopy;  Laterality: N/A;  NOT TOO EARLY   CYSTOSCOPY WITH STENT PLACEMENT Right 01/25/2016   Procedure: CYSTOSCOPY WITH STENT PLACEMENT;  Surgeon: Redell Lynwood Napoleon, MD;  Location: [facility] ORS;   Service: Urology;  Laterality: Right;   FRAMELESS  BIOPSY WITH BRAINLAB Right 01/17/2021   Procedure: RIGHT STEREOTACTIC BIOPSY OF INSULAR LESION;  Surgeon: Debby Dorn MATSU, MD;  Location: [facility] OR;  Service: Neurosurgery;  Laterality: Right;   KNEE ARTHROSCOPY W/ MENISCAL REPAIR Right 06/23/1988   POLYPECTOMY  01/07/2023   Procedure: POLYPECTOMY;  Surgeon: Jinny Carmine, MD;  Location: [facility] ENDOSCOPY;  Service: Endoscopy;;   removal of birthmark  06/08/2013   SKIN CANCER EXCISION  06/23/2012   TYMPANOSTOMY TUBE PLACEMENT  08/06/1978   URETEROSCOPY WITH HOLMIUM LASER LITHOTRIPSY Right 01/25/2016   Procedure: URETEROSCOPY WITH HOLMIUM LASER LITHOTRIPSY;  Surgeon: Redell Lynwood Napoleon, MD;  Location: [facility] ORS;  Service: Urology;  Laterality: Right;   URETHRAL STRICTURE DILATATION     visual inspection of vocal cord     1973   WISDOM TOOTH EXTRACTION      SOCIAL HISTORY: Social History   Socioeconomic History   Marital status: Married    Spouse name: Not on file   Number of children: Not on file   Years of education: Not on file   Highest education level: Not on file  Occupational History   Not on file  Tobacco Use   Smoking status: Never   Smokeless tobacco: Never  Vaping Use   Vaping status: Never Used  Substance and Sexual Activity   Alcohol use: Not Currently    Comment: rare   Drug use: No   Sexual activity: Not Currently  Other Topics Concern  Not on file  Social History Narrative   Not on file   Social Drivers of Health   Financial Resource Strain: Low Risk  (12/05/2022)   Received from [facility]   Overall Financial Resource Strain (CARDIA)    Difficulty of Paying Living Expenses: Not hard at all  Food Insecurity: No Food Insecurity (12/18/2023)   Hunger Vital Sign    Worried About Running Out of Food in the Last Year: Never true    Ran Out of Food in the Last Year: Never true  Transportation Needs: No Transportation Needs (12/18/2023)   PRAPARE  - Administrator, Civil Service (Medical): No    Lack of Transportation (Non-Medical): No  Physical Activity: Not on file  Stress: Not on file  Social Connections: Moderately Isolated (12/09/2023)   Social Connection and Isolation Panel    Frequency of Communication with Friends and Family: More than three times a week    Frequency of Social Gatherings with Friends and Family: More than three times a week    Attends Religious Services: Never    Database administrator or Organizations: No    Attends Banker Meetings: Never    Marital Status: Married  Catering manager Violence: Not At Risk (12/18/2023)   Humiliation, Afraid, Rape, and Kick questionnaire    Fear of Current or Ex-Partner: No    Emotionally Abused: No    Physically Abused: No    Sexually Abused: No    FAMILY HISTORY: Family History  Problem Relation Age of Onset   Hyperlipidemia Father    Hypertension Father    Diabetes Father    Benign prostatic hyperplasia Father    Stroke Maternal Grandmother    Diabetes Maternal Grandmother    Stroke Paternal Grandmother    Hypertension Paternal Grandmother    Kidney disease Neg Hx    Prostate cancer Neg Hx     ALLERGIES:  is allergic to contrast media [iodinated contrast media] and keppra  [levetiracetam ].  MEDICATIONS:  Current Facility-Administered Medications  Medication Dose Route Frequency Provider Last Rate Last Admin   acetaminophen  (TYLENOL ) tablet 325-650 mg  325-650 mg Oral Q4H PRN Love, Pamela S, PA-C       alum & mag hydroxide-simeth (MAALOX/MYLANTA) 200-200-20 MG/5ML suspension 30 mL  30 mL Oral Q4H PRN Love, Pamela S, PA-C       amLODipine  (NORVASC ) tablet 5 mg  5 mg Oral Daily Love, Pamela S, PA-C   5 mg at 01/05/24 9240   apixaban  (ELIQUIS ) tablet 5 mg  5 mg Oral BID Love, Pamela S, PA-C   5 mg at 01/05/24 9240   atorvastatin  (LIPITOR) tablet 40 mg  40 mg Oral Q supper Love, Pamela S, PA-C   40 mg at 01/04/24 1756   bisacodyl   (DULCOLAX) suppository 10 mg  10 mg Rectal Daily PRN Love, Pamela S, PA-C       diphenhydrAMINE  (BENADRYL ) capsule 25 mg  25 mg Oral Q6H PRN Love, Pamela S, PA-C       feeding supplement (ENSURE PLUS HIGH PROTEIN) liquid 237 mL  237 mL Oral TID BM Emeline Search C, DO   237 mL at 01/04/24 1428   guaiFENesin -dextromethorphan (ROBITUSSIN DM) 100-10 MG/5ML syrup 5-10 mL  5-10 mL Oral Q6H PRN Love, Pamela S, PA-C       lamoTRIgine  (LAMICTAL ) tablet 200 mg  200 mg Oral Daily Love, Pamela S, PA-C   200 mg at 01/05/24 0800   And  lamoTRIgine  (LAMICTAL ) tablet 200 mg  200 mg Oral QHS Love, Pamela S, PA-C   200 mg at 01/04/24 2125   LORazepam  (ATIVAN ) injection 0.5 mg  0.5 mg Intravenous Q6H PRN Love, Pamela S, PA-C       meclizine  (ANTIVERT ) tablet 12.5 mg  12.5 mg Oral BID PRN Engler, Morgan C, DO   12.5 mg at 12/31/23 0825   megestrol  (MEGACE ) 400 MG/10ML suspension 400 mg  400 mg Oral BID Swartz, Esra Frankowski T, MD   400 mg at 01/05/24 0759   melatonin tablet 5 mg  5 mg Oral QHS PRN Love, Pamela S, PA-C   5 mg at 12/27/23 2004   multivitamin with minerals tablet 1 tablet  1 tablet Oral Daily Emeline Search C, DO   1 tablet at 01/05/24 0759   ondansetron  (ZOFRAN -ODT) disintegrating tablet 8 mg  8 mg Oral Q8H Engler, Morgan C, DO   8 mg at 01/05/24 9447   Or   ondansetron  (ZOFRAN ) 8 mg in sodium chloride  0.9 % 50 mL IVPB  8 mg Intravenous Q8H Emeline Search C, DO   Stopped at 01/01/24 0540   pantoprazole  (PROTONIX ) EC tablet 40 mg  40 mg Oral BID AC Engler, Morgan C, DO   40 mg at 01/05/24 9371   polyethylene glycol (MIRALAX  / GLYCOLAX ) packet 17 g  17 g Oral Daily PRN Engler, Morgan C, DO       polyethylene glycol (MIRALAX  / GLYCOLAX ) packet 17 g  17 g Oral Daily [address] [place], PA-C   17 g at 01/05/24 9198   prochlorperazine  (COMPAZINE ) tablet 2.5 mg  2.5 mg Oral Q8H PRN Emeline Search C, DO   2.5 mg at 01/05/24 1213   propranolol  (INDERAL ) tablet 20 mg  20 mg Oral BID Love, Pamela S, PA-C   20 mg at  01/05/24 9240   senna-docusate (Senokot-S) tablet 1 tablet  1 tablet Oral BID Emeline Search C, DO   1 tablet at 01/05/24 9240   sodium phosphate  (FLEET) enema 1 enema  1 enema Rectal Once PRN Love, Sharlet RAMAN, PA-C        REVIEW OF SYSTEMS:   Constitutional: Denies fevers, chills or abnormal weight loss Eyes: Denies blurriness of vision Ears, nose, mouth, throat, and face: Denies mucositis or sore throat Respiratory: Denies cough, dyspnea or wheezes Cardiovascular: Denies palpitation, chest discomfort or lower extremity swelling Gastrointestinal:  Denies nausea, constipation, diarrhea GU: Denies dysuria or incontinence Skin: Denies abnormal skin rashes Neurological: Per HPI Musculoskeletal: Denies joint pain, back or neck discomfort. No decrease in ROM Behavioral/Psych: Denies anxiety, disturbance in thought content, and mood instability   PHYSICAL EXAMINATION: Vitals:   01/05/24 0553 01/05/24 1320  BP: 130/80 (!) 127/90  Pulse: 65 (!) 54  Resp: 17 18  Temp: 97.7 F (36.5 C) 98 F (36.7 C)  SpO2: 95% 99%   KPS: 60. General: Alert, cooperative, pleasant, in no acute distress Head: Normal EENT: No conjunctival injection or scleral icterus. Oral mucosa moist Lungs: Resp effort normal Cardiac: Regular rate and rhythm Abdomen: Soft, non-distended abdomen Skin: No rashes cyanosis or petechiae. Extremities: No clubbing or edema  NEUROLOGIC EXAM: Mental Status: Awake, alert, attentive to examiner. Oriented to self and environment. Language is densely impaired with regards to fluency.  Cranial Nerves: Visual acuity is grossly normal. Visual fields are full. Extra-ocular movements intact. No ptosis. Face is symmetric, tongue midline. L UMN facial paresis Motor: Tone and bulk are normal. Power is 3/5 in left arm and leg. Reflexes  are symmetric, no pathologic reflexes present. Intact finger to nose bilaterally Sensory: Intact to light touch and temperature Gait: Non  ambulatory   LABORATORY DATA:  I have reviewed the data as listed Lab Results  Component Value Date   WBC 8.3 01/04/2024   HGB 15.6 01/04/2024   HCT 44.2 01/04/2024   MCV 83.4 01/04/2024   PLT 213 01/04/2024   Recent Labs    12/18/23 1440 12/19/23 0523 12/23/23 0448 12/25/23 0441 12/28/23 0501 12/31/23 0555 01/04/24 0510  NA 138   < > 137 138 140 137 137  K 4.4   < > 4.1 3.8 3.9 3.9 3.6  CL 100   < > 102 104 104 101 100  CO2 29   < > 26 26 27 27 28   GLUCOSE 105*   < > 117* 97 99 88 98  BUN 15   < > 11 16 17 11 13   CREATININE 1.14   < > 0.87 0.93 0.88 0.96 0.98  CALCIUM  9.9   < > 9.4 9.2 9.5 9.6 9.8  GFRNONAA >60   < > >60 >60 >60 >60 >60  PROT 7.7  --  6.5 6.6  --   --   --   ALBUMIN 4.3  --  3.6 3.8  --   --   --   AST 27  --  20 30  --   --   --   ALT 44  --  32 40  --   --   --   ALKPHOS 62  --  56 67  --   --   --   BILITOT 0.6  --  0.6 0.9  --   --   --    < > = values in this interval not displayed.    RADIOGRAPHIC STUDIES: I have personally reviewed the radiological images as listed and agreed with the findings in the report. 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 COMPARISON:  CT of the head dated December 28, 2023 and MRI of the head dated December 23, 2023. FINDINGS: Brain: There has been interval evolution of nonhemorrhagic infarcts involving the right cingulate gyrus and the right corona radiata and basal ganglia, as well as the left lentiform nucleus. There is residual increased diffusion signal within the previously treated right frontal lobe mass, which continues to demonstrate hemosiderin staining and surrounding T2 signal hyperintensity. There are no new areas of  restricted diffusion present. There is generalized cerebral volume loss and there are confluent zones of increased T2 signal in the periventricular white matter. There is will air in degeneration of the right cerebral peduncle. Vascular: Normal flow voids. Skull and upper cervical spine: Normal marrow signal. No osseous lesions. Sinuses/Orbits: Mild mucosal disease within the ethmoid air cells and left maxillary sinus. Normal orbits. Other: None. IMPRESSION: 1. Interval evolution of the nonhemorrhagic infarcts involving the right single lid gyrus, right basal ganglia and periventricular white matter and left lentiform nucleus. 2. Stable appearance of right frontal lobe previously treated neoplasm, with hemosiderin staining from prior hemorrhage and surrounding gliosis. No adverse interval change. Electronically Signed   By: [name] M.D.   On: 01/05/2024 15:46   CT HEAD WO CONTRAST ( ) Result Date: 12/28/2023 CLINICAL DATA:  seizure, recent nonhemorrhagic  infarcts and oligodendroglioma status post treatment. EXAM: CT HEAD WITHOUT CONTRAST TECHNIQUE: Contiguous axial images were obtained from the base of the skull through the vertex without intravenous contrast. RADIATION DOSE REDUCTION: This exam was performed according to the departmental dose-optimization program which includes automated exposure control, adjustment of the mA and/or kV according to patient size and/or use of iterative reconstruction technique. COMPARISON:  CT of the head dated in December 24, 2023 an MRI of the brain dated December 23, 2023. FINDINGS: Brain: Areas of dystrophic calcification are again noted within the right frontal lobe and along the medial appended mole surface of the atrium of the right lateral ventricle. There is an evolving nonhemorrhagic infarct in fall thing the right cingulate gyrus, with decreased cytotoxic edema in the interim. There is no evidence of hemorrhagic conversion. Evolving nonhemorrhagic infarcts are also  noted within the right corona radiata and basal ganglia. There is no significant mass effect or midline shift. Vascular: Moderate calcific atheromatous disease. Skull: Right frontal burr craniotomy sites.  Intact otherwise. Sinuses/Orbits: Moderate mucosal disease within the ethmoid air cells and floor of the left frontal sinus. Normal orbits. Other: Clear mastoid air cells. IMPRESSION: 1. Evolving nonhemorrhagic infarcts involving the right cingulate gyrus and right basal ganglia. 2. Stable dystrophic calcification within the right frontal lobe and periatrial white matter from previously treated neoplasm. Electronically Signed   By: [name] M.D.   On: 12/28/2023 11:46   EEG adult Result Date: 12/28/2023 [name], MD     12/28/2023 11:26 AM Patient Name: Alan Wise MRN: [id number] Epilepsy Attending: [name] Shelton Referring Physician/Provider: [name], PA-C Date: 12/28/2023 Duration: 21.50 mins Patient history: 53yo M with h/o epilepsy getting eeg to evaluate fors eizure Level of alertness: Awake, asleep AEDs during EEG study: LTG Technical aspects: This EEG study was done with scalp electrodes positioned according to the 10-20 International system of electrode placement. Electrical activity was reviewed with band pass filter of 1-70Hz , sensitivity of 7 uV/mm, display speed of 16mm/sec with a 60Hz  notched filter applied as appropriate. EEG data were recorded continuously and digitally stored.  Video monitoring was available and reviewed as appropriate. Description: The posterior dominant rhythm consists of 8-9 Hz activity of moderate voltage (25-35 uV) seen predominantly in posterior head regions, symmetric and reactive to eye opening and eye closing. Sleep was characterized by vertex waves, sleep spindles (12 to 14 Hz), maximal frontocentral region. EEG showed continuous 3 to 6 Hz theta-delta slowing in right frontotemporal region. Hyperventilation and photic stimulation were not  performed.    ABNORMALITY - Continuous slow, right frontotemporal region  IMPRESSION: This study is suggestive of cortical dysfunction arising from right frontotemporal region likely secondary to underlying strokes, postictal state. No seizures or epileptiform discharges were seen throughout the recording.  Priyanka O Yadav    VAS US  CAROTID Result Date: 12/25/2023 Carotid Arterial Duplex Study Patient Name:  Alan Wise  Date of Exam:   12/24/2023 Medical Rec #: [id number]          Accession #:    [id number] Date of Birth: Nov 08, 1970         Patient Gender: M Patient Age:   48 years Exam Location:  [facility] Procedure:      VAS US  CAROTID Referring Phys: DEVON SHAFER --------------------------------------------------------------------------------  Indications:       CVA. Risk Factors:      Hyperlipidemia, Diabetes. Other Factors:     GERD. Comparison Study:  No priors. Performing  Technologist: Ricka Sturdivant-Jones RDMS, RVT  Examination Guidelines: A complete evaluation includes B-mode imaging, spectral Doppler, color Doppler, and power Doppler as needed of all accessible portions of each vessel. Bilateral testing is considered an integral part of a complete examination. Limited examinations for reoccurring indications may be performed as noted.  Right Carotid Findings: +----------+--------+--------+--------+------------------+------------------+           PSV cm/sEDV cm/sStenosisPlaque DescriptionComments           +----------+--------+--------+--------+------------------+------------------+ CCA Prox  104     16                                                   +----------+--------+--------+--------+------------------+------------------+ CCA Distal89      18                                intimal thickening +----------+--------+--------+--------+------------------+------------------+ ICA Prox  74      20      1-39%   homogeneous                           +----------+--------+--------+--------+------------------+------------------+ ICA Mid   66      18                                                   +----------+--------+--------+--------+------------------+------------------+ ICA Distal60      22                                                   +----------+--------+--------+--------+------------------+------------------+ ECA       118     11                                                   +----------+--------+--------+--------+------------------+------------------+ +----------+--------+-------+----------------+-------------------+           PSV cm/sEDV cmsDescribe        Arm Pressure (mmHG) +----------+--------+-------+----------------+-------------------+ [id number]            Multiphasic, WNL                    +----------+--------+-------+----------------+-------------------+ +---------+--------+--+--------+--+---------+ VertebralPSV cm/s39EDV cm/s13Antegrade +---------+--------+--+--------+--+---------+  Left Carotid Findings: +----------+--------+--------+--------+------------------+--------+           PSV cm/sEDV cm/sStenosisPlaque DescriptionComments +----------+--------+--------+--------+------------------+--------+ CCA Prox  111     15                                         +----------+--------+--------+--------+------------------+--------+ CCA Distal109     19                                         +----------+--------+--------+--------+------------------+--------+ ICA Prox  56  13      1-39%   calcific                   +----------+--------+--------+--------+------------------+--------+ ICA Mid   57      19                                         +----------+--------+--------+--------+------------------+--------+ ICA Distal56      21                                         +----------+--------+--------+--------+------------------+--------+ ECA       78      8                                           +----------+--------+--------+--------+------------------+--------+ +----------+--------+--------+----------------+-------------------+           PSV cm/sEDV cm/sDescribe        Arm Pressure (mmHG) +----------+--------+--------+----------------+-------------------+ [id number]             Multiphasic, WNL                    +----------+--------+--------+----------------+-------------------+ +---------+--------+--+--------+--+---------+ VertebralPSV cm/s42EDV cm/s10Antegrade +---------+--------+--+--------+--+---------+   Summary: Right Carotid: Velocities in the right ICA are consistent with a 1-39% stenosis. Left Carotid: Velocities in the left ICA are consistent with a 1-39% stenosis. Vertebrals:  Bilateral vertebral arteries demonstrate antegrade flow. Subclavians: Normal flow hemodynamics were seen in bilateral subclavian              arteries. *See table(s) above for measurements and observations.  Electronically signed by Eather Popp MD on 12/25/2023 at 11:18:24 AM.    Final    CT HEAD WO CONTRAST ( ) Result Date: 12/25/2023 CLINICAL DATA:  Head trauma, minor, normal mental status (Age 56-64y) S/ FALL. EXAM: CT HEAD WITHOUT CONTRAST TECHNIQUE: Contiguous axial images were obtained from the base of the skull through the vertex without intravenous contrast. RADIATION DOSE REDUCTION: This exam was performed according to the departmental dose-optimization program which includes automated exposure control, adjustment of the mA and/or kV according to patient size and/or use of iterative reconstruction technique. COMPARISON:  CT head 12/22/2023.  MRI head 12/23/2023. FINDINGS: Brain: Similar oval vein right MCA and ACA territory infarcts without acute hemorrhage or progressive mass effect. Additional scattered white matter hypodensities, compatible with chronic microvascular ischemic disease. No hydrocephalus. Cerebral atrophy. Similar dystrophic  calcifications in the right frontal and periatrial region. Vascular: No hyperdense vessel identified. Skull: No acute fracture. Sinuses/Orbits: Paranasal sinus mucosal thickening. No acute orbital findings. IMPRESSION: 1. Similar right ACA and MCA territory infarcts without progressive mass effect or acute hemorrhage. MRI could provide more sensitive evaluation for new/interval acute infarct if clinically warranted. 2. Similar appearance of treated tumor with dystrophic calcifications. Electronically Signed   By: Gilmore GORMAN Molt M.D.   On: 12/25/2023 00:19   DG Swallowing Func-Speech Pathology Result Date: 12/24/2023 Table formatting from the original result was not included. Modified Barium Swallow Study Patient Details Name: Alan Wise MRN: [id number] Date of Birth: 1970-07-29 Today's Date: 12/24/2023 HPI/PMH: HPI: 53 y.o. male presents [facility] 12/18/23 after falling two days prior with residual L sided weakness and somnolence. Brain MRI showed  new patchy restricted diffusion in R basal ganglia, new punctuate focus of restricted diffusion in R caudate head. Recent admit 6/17-6/19 with anterior R temporal lobe punctate restricted diffusion. PMHx: T2DM, GERD, CVA w/ L sided weakness, oligodendroglioma Clinical Impression: Pt presents with a mild pharyngeal dysphagia and a primary esophageal dysphagia that is likely contributing to pharyngeal deficits. Recommend follow up with GI to determine if pt is a candidate for any intervention to aid esophageal clearance of solids and liquids. Oral phase judged to be WNL. Pharyngeal deficits characterized by reduced base of tongue retraction, occasional mistiming of epiglottic inversion (vs incomplete inversion) and laryngeal vestibule closure, which contributed to aspiration events. Reduced distention of the UES was the greatest pharyngeal concern. Concerns for esophageal dysphagia included moderate retention of pudding and solid in upper esophagus and backflow of thin  liquids (by straw) through the UES. Retention of solids likely contributed to aspiration event of thin liquid wash that followed solid trials. During esophageal sweep, retention of liquids observed in the lower esophagus with some retrograde flow.  This residue did eventually pass to the stomach. Findings: -All aspiration events were audible and minimal. -There was audible aspiration of nectar-thick liquids x1 by cup during the swallow, suspect due to mistiming of epiglottic inversion and laryngeal vestibule closure. -There was audible aspiration of thin liquid wash that followed solid trial. -There was pharyngeal residue primarily in the UES post swallow. Residue amount increased with increased viscosity. -special note: pt does have calcification of portions of the airway and suspected in portion of artery around the anterior upper esophagus. This is mentioned because calcified areas move with swallow initiation and it briefly looks like aspiration. Detailed diet recommendations and aspiration precautions outlined below. Despite aspiration of thin liquids and ongoing risk of aspiration, recommend continue this diet given pt has not exhibited concerns for pneumonia and pt is at a risk of aspirated nectar-thick liquids. Research shows the aspiration of thickened liquids can be more detrimental to the lungs; therefore, we will continue to monitor his tolerance of thin liquids closely. SLP will continue to follow for language and swallowing intervention. Factors that may increase risk of adverse event in presence of aspiration [name] & [name] 2021): No data recorded Recommendations/Plan: Swallowing Evaluation Recommendations Swallowing Evaluation Recommendations Recommendations: PO diet PO Diet Recommendation: Thin liquids (Level 0); Dysphagia 3 (Mechanical soft) Liquid Administration via: Cup; No straw Medication Administration: Whole meds with puree (consider crushing if he is having pill dysphagia) Supervision:  Patient able to self-feed; Intermittent supervision/cueing for swallowing strategies Swallowing strategies  : Slow rate; Small bites/sips; Follow solids with liquids Postural changes: Position pt fully upright for meals; Stay upright 30-60 min after meals Oral care recommendations: Oral care BID (2x/day) Recommended consults: Consider GI consultation Treatment Plan Treatment Plan Treatment recommendations: Therapy as outlined in treatment plan below Follow-up recommendations: Acute inpatient rehab (3 hours/day) Functional status assessment: Patient has had a recent decline in their functional status and demonstrates the ability to make significant improvements in function in a reasonable and predictable amount of time. Treatment frequency: Min 2x/week Treatment duration: 4 weeks Interventions: Aspiration precaution training; Diet toleration management by SLP; Compensatory techniques; Patient/family education; Respiratory muscle strength training Recommendations Recommendations for follow up therapy are one component of a multi-disciplinary discharge planning process, led by the attending physician.  Recommendations may be updated based on patient status, additional functional criteria and insurance authorization. Assessment: Orofacial Exam: Orofacial Exam Oral Cavity: Oral Hygiene: WFL Oral Cavity - Dentition: Adequate natural dentition  Orofacial Anatomy: WFL Oral Motor/Sensory Function: WFL Anatomy: Anatomy: WFL Boluses Administered: Boluses Administered Boluses Administered: Thin liquids (Level 0); Mildly thick liquids (Level 2, nectar thick); Puree; Solid  Oral Impairment Domain: Oral Impairment Domain Lip Closure: No labial escape Tongue control during bolus hold: Cohesive bolus between tongue to palatal seal Bolus preparation/mastication: Timely and efficient chewing and mashing Bolus transport/lingual motion: Brisk tongue motion Oral residue: Trace residue lining oral structures Location of oral residue :  Tongue Initiation of pharyngeal swallow : Posterior angle of the ramus  Pharyngeal Impairment Domain: Pharyngeal Impairment Domain Soft palate elevation: No bolus between soft palate (SP)/pharyngeal wall (PW) Laryngeal elevation: Complete superior movement of thyroid  cartilage with complete approximation of arytenoids to epiglottic petiole Anterior hyoid excursion: Complete anterior movement Epiglottic movement: Partial inversion (varied; inconsistent) Laryngeal vestibule closure: Complete, no air/contrast in laryngeal vestibule Pharyngeal stripping wave : Present - diminished Pharyngeal contraction (A/P view only): Incomplete (pseudoviderticulae) Pharyngoesophageal segment opening: Partial distention/partial duration, partial obstruction of flow Tongue base retraction: Trace column of contrast or air between tongue base and PPW Pharyngeal residue: Collection of residue within or on pharyngeal structures Location of pharyngeal residue: Pyriform sinuses; Pharyngeal wall  Esophageal Impairment Domain: Esophageal Impairment Domain Esophageal clearance upright position: Esophageal retention with retrograde flow through the PES Pill: No data recorded Penetration/Aspiration Scale Score: Penetration/Aspiration Scale Score 1.  Material does not enter airway: Thin liquids (Level 0); Mildly thick liquids (Level 2, nectar thick); Puree; Solid 7.  Material enters airway, passes BELOW cords and not ejected out despite cough attempt by patient: Thin liquids (Level 0); Mildly thick liquids (Level 2, nectar thick) Compensatory Strategies: Compensatory Strategies Compensatory strategies: Yes Liquid wash: -- (partially effective for reducing residue in upper esophagus)   General Information: Caregiver present: No (follow up completed at bedside post study)  Diet Prior to this Study: Dysphagia 3 (mechanical soft); Thin liquids (Level 0)   Temperature : Normal   Respiratory Status: WFL   Supplemental O2: None (Room air)   History of  Recent Intubation: No  Behavior/Cognition: Alert; Cooperative Self-Feeding Abilities: Able to self-feed Baseline vocal quality/speech: Normal No data recorded Volitional Swallow: Able to elicit Exam Limitations: No limitations Goal Planning: Prognosis for improved oropharyngeal function: Good Barriers to Reach Goals: Language deficits No data recorded Patient/Family Stated Goal: improve communication; hopeful to get back to his baseline Consulted and agree with results and recommendations: Patient; Family member/caregiver; Physician Pain: Pain Assessment Pain Assessment: No/denies pain Faces Pain Scale: 2 Pain Location: L shoulder with mobility Pain Descriptors / Indicators: Discomfort; Aching Pain Intervention(s): Limited activity within patient's tolerance; Monitored during session; Other (comment) (ROM for stiffness) End of Session: Start Time:SLP Start Time (ACUTE ONLY): 0830 Stop Time: SLP Stop Time (ACUTE ONLY): 9141 Time Calculation:SLP Time Calculation (min) (ACUTE ONLY): 28 min Charges: SLP Evaluations $ SLP Speech Visit: 1 Visit SLP Evaluations $MBS Swallow: 1 Procedure $Speech Treatment for Individual: 1 Procedure SLP visit diagnosis: SLP Visit Diagnosis: Aphasia (R47.01); Cognitive communication deficit (R41.841); Dysphagia, pharyngoesophageal phase (R13.14) Past Medical History: Past Medical History: Diagnosis Date  Allergy   Complication of anesthesia   pt reports he is starting to have more difficulty arousing after surgery  Diabetes mellitus (HCC)   GERD (gastroesophageal reflux disease)   Headache   History of kidney stones   Hyperlipidemia   Renal disorder   kidney stones Past Surgical History: Past Surgical History: Procedure Laterality Date  APPLICATION OF CRANIAL NAVIGATION Right 01/17/2021  Procedure: APPLICATION OF CRANIAL NAVIGATION;  Surgeon:  Debby Dorn MATSU, MD;  Location: [facility] OR;  Service: Neurosurgery;  Laterality: Right;  COLONOSCOPY  09/03/1994  COLONOSCOPY WITH PROPOFOL  N/A 01/07/2023   Procedure: COLONOSCOPY WITH PROPOFOL ;  Surgeon: Jinny Carmine, MD;  Location: [facility] ENDOSCOPY;  Service: Endoscopy;  Laterality: N/A;  NOT TOO EARLY  CYSTOSCOPY WITH STENT PLACEMENT Right 01/25/2016  Procedure: CYSTOSCOPY WITH STENT PLACEMENT;  Surgeon: Redell Lynwood Napoleon, MD;  Location: [facility] ORS;  Service: Urology;  Laterality: Right;  FRAMELESS  BIOPSY WITH BRAINLAB Right 01/17/2021  Procedure: RIGHT STEREOTACTIC BIOPSY OF INSULAR LESION;  Surgeon: Debby Dorn MATSU, MD;  Location: [facility] OR;  Service: Neurosurgery;  Laterality: Right;  KNEE ARTHROSCOPY W/ MENISCAL REPAIR Right 06/23/1988  POLYPECTOMY  01/07/2023  Procedure: POLYPECTOMY;  Surgeon: Jinny Carmine, MD;  Location: [facility] ENDOSCOPY;  Service: Endoscopy;;  removal of birthmark  06/08/2013  SKIN CANCER EXCISION  06/23/2012  TYMPANOSTOMY TUBE PLACEMENT  08/06/1978  URETEROSCOPY WITH HOLMIUM LASER LITHOTRIPSY Right 01/25/2016  Procedure: URETEROSCOPY WITH HOLMIUM LASER LITHOTRIPSY;  Surgeon: Redell Lynwood Napoleon, MD;  Location: [facility] ORS;  Service: Urology;  Laterality: Right;  URETHRAL STRICTURE DILATATION    visual inspection of vocal cord    1973  WISDOM TOOTH EXTRACTION   [name] 12/24/2023, 12:12 PM  MR BRAIN WO CONTRAST Result Date: 12/23/2023 CLINICAL DATA:  53 year old male with punctate right temporal lobe infarct last month. Neurologic deficit, right basal ganglia infarct on 12/18/2023. EXAM: MRI HEAD WITHOUT CONTRAST TECHNIQUE: Multiplanar, multiecho pulse sequences of the brain and surrounding structures were obtained without intravenous contrast. COMPARISON:  Brain MRI 12/18/2023 and earlier. FINDINGS: Brain: Progressive restricted diffusion now beginning anterior to the right caudate and continuing through the nearby right ACA territory, confluent involvement along a 4-5 cm segment of the right cingulate (series 3, image 40). Unresolved diffusion restriction previously seen along the right frontal horn, and also in the posterior right corona  radiata tracking toward the right lentiform, and also lateral to the atrium of the right lateral ventricle. Also punctate new restricted diffusion in the left lentiform series 3, image 30. T2 and FLAIR hyperintense cytotoxic edema in the affected areas. Petechial hemorrhage on T2* imaging not significantly changed from previous SWI Heidelberg classification 1b: HI2, confluent petechiae, no mass effect. Pre-existing cystic encephalomalacia and hemosiderin along the anterior right MCA division also. Stable T2 and FLAIR hyperintense probable gliosis of the right insula there. Stable encephalomalacia with T2 shine through along the anterior the left genu of the corpus callosum. Chronic brainstem Wallerian degeneration, greater on the right. No midline shift, mass effect, evidence of mass lesion, acute ventriculomegaly, extra-axial collection. Cervicomedullary junction and pituitary are within normal limits. Vascular: Major intracranial vascular flow voids are stable. Skull and upper cervical spine: Negative. Visualized bone marrow signal is within normal limits. Sinuses/Orbits: Stable paranasal sinus mucosal thickening and opacification. Negative orbits. Other: Mastoids well aerated. IMPRESSION: 1. New acute and confluent mid and distal Right ACA territory infarction. Punctate new left basal ganglia acute lacunar infarct. And ongoing multifocal right MCA deep white and deep gray matter ischemia otherwise not significantly changed from 12/18/2023. 2. Associated fatigue no hemorrhage. No malignant hemorrhagic transformation. No intracranial mass effect or midline shift. 3. Underlying chronic ischemic disease and encephalomalacia. Electronically Signed   By: VEAR Hurst M.D.   On: 12/23/2023 13:02   Overnight EEG with video Result Date: 12/23/2023 [name], MD     12/23/2023 12:49 PM Patient Name: Alan Wise MRN: [id number] Epilepsy Attending: [name] Shelton Referring Physician/Provider: Judithe,  Rocky BROCKS, NP  Duration: 12/22/2023 2256 to 12/23/2023 9073 Patient history: 53 year old male with altered mental status.  Today had left gaze preference, left hemianopia and facial droop with waxing and waning aphasia and weakness.  EEG to evaluate for seizure. Level of alertness: Awake, asleep AEDs during EEG study: LTG Technical aspects: This EEG study was done with scalp electrodes positioned according to the 10-20 International system of electrode placement. Electrical activity was reviewed with band pass filter of 1-70Hz , sensitivity of 7 uV/mm, display speed of 93mm/sec with a 60Hz  notched filter applied as appropriate. EEG data were recorded continuously and digitally stored.  Video monitoring was available and reviewed as appropriate. Description: The posterior dominant rhythm consists of 8-9 Hz activity of moderate voltage (25-35 uV) seen predominantly in posterior head regions, symmetric and reactive to eye opening and eye closing. Sleep was characterized by vertex waves, sleep spindles (12 to 14 Hz), maximal frontocentral region. EEG showed continuous 3 to 6 Hz theta-delta slowing in right frontotemporal region. Hyperventilation and photic stimulation were not performed.   ABNORMALITY - Continuous slow, right frontotemporal region IMPRESSION: This study is suggestive of cortical dysfunction arising from right frontotemporal region likely secondary to underlying strokes, postictal state. No seizures or epileptiform discharges were seen throughout the recording. Arlin MALVA Krebs   MR ANGIO HEAD WO CONTRAST Result Date: 12/23/2023 CLINICAL DATA:  Neuro deficit, acute, stroke suspected EXAM: MRA HEAD WITHOUT CONTRAST TECHNIQUE: Angiographic images of the Circle of Willis were acquired using MRA technique without intravenous contrast. COMPARISON:  CT head from earlier today. FINDINGS: Anterior circulation: Hypoplastic left A1 ACA. Otherwise, bilateral intracranial ICAs, MCAs, and ACAs are patent. 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 Posterior circulation: Bilateral intradural vertebral arteries, basilar artery and bilateral posterior cerebral arteries are patent without proximal hemodynamically significant stenosis. Bilateral fetal type PCAs, anatomic variant. IMPRESSION: 1. No emergent large vessel occlusion. 2. 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 Electronically Signed   By: Gilmore GORMAN Molt M.D.   On: 12/23/2023 00:48   CT HEAD WO CONTRAST ( ) Result Date: 12/22/2023 CLINICAL DATA:  Stroke, follow up EXAM: CT HEAD WITHOUT CONTRAST TECHNIQUE: Contiguous axial images were obtained from the base of the skull through the vertex without intravenous contrast. RADIATION DOSE REDUCTION: This exam was performed according to the departmental dose-optimization program which includes automated exposure control, adjustment of the mA and/or kV according to patient size and/or use of iterative reconstruction technique. COMPARISON:  CT head December 18, 2023. FINDINGS: Brain: Stable areas of dystrophic calcification encephalomalacia in the regions of prior treated malignancy including right frontal lobe and right periatrial region. Known infarcts were better characterized on recent MRI, but do not appear substantially changed when comparing across modalities. No progressive mass effect or acute hemorrhage. Vascular: No hyperdense vessel identified. Skull: No acute fracture. Sinuses/Orbits: Ethmoid air cell and frontal sinus mucosal thickening. No acute orbital findings. Other: No mastoid effusions. IMPRESSION: 1. Known infarcts were better characterized on recent MRI, but do not appear substantially changed when comparing across modalities. No progressive mass effect or acute hemorrhage. 2. Similar appearance of treated tumor with dystrophic calcification. Electronically Signed   By: Gilmore GORMAN Molt M.D.   On: 12/22/2023 20:13   DG Shoulder Left Result Date: 12/19/2023 CLINICAL DATA:  [id number] Pain [id number] EXAM: LEFT SHOULDER - 2+ VIEW  COMPARISON:  April 11, 2023 FINDINGS: No acute fracture or dislocation. There is no evidence of arthropathy or other focal bone abnormality. Soft tissues are unremarkable. IMPRESSION: No  acute fracture or dislocation. Electronically Signed   By: Rogelia Alan M.D.   On: 12/19/2023 19:21   EEG adult Result Date: 12/19/2023 [name], MD     12/19/2023  4:51 PM Patient Name: Alan Wise MRN: [id number] Epilepsy Attending: [name] Shelton Referring Physician/Provider: Noralee Elidia Sieving, MD Date: 12/19/2023 Duration: 27.36 mins Patient history: 53 y.o. 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 EEG to evaluate for seizure Level of alertness: Awake, asleep AEDs during EEG study: LTG Technical aspects: This EEG study was done with scalp electrodes positioned according to the 10-20 International system of electrode placement. Electrical activity was reviewed with band pass filter of 1-70Hz , sensitivity of 7 uV/mm, display speed of 45mm/sec with a 60Hz  notched filter applied as appropriate. EEG data were recorded continuously and digitally stored.  Video monitoring was available and reviewed as appropriate. Description: The posterior dominant rhythm consists of 8 Hz activity of moderate voltage (25-35 uV) seen predominantly in posterior head regions, symmetric and reactive to eye opening and eye closing. Sleep was characterized by vertex waves, sleep spindles (12 to 14 Hz), maximal frontocentral region. EEG showed continuous 3 to 6 Hz theta-delta slowing in right hemisphere, maximal right fronto-temporal region. Hyperventilation and photic stimulation were not performed.   ABNORMALITY - Continuous slow, right hemisphere, maximal right fronto-temporal region IMPRESSION: This study is suggestive of cortical dysfunction arising from right hemisphere, maximal right fronto-temporal region likely secondary to underlying  structural abnormality. No seizures or definite epileptiform discharges were seen throughout the recording. [name] Shelton   MR BRAIN WO CONTRAST Result Date: 12/18/2023 CLINICAL DATA:  Provided history: History of stroke, acute worsening of chronic left-sided weakness. Additional history obtained from prior radiology records: History of oligodendroglioma. EXAM: MRI HEAD WITHOUT CONTRAST TECHNIQUE: Multiplanar, multiecho pulse sequences of the brain and surrounding structures were obtained without intravenous contrast. COMPARISON:  Non-contrast head CT performed earlier today 12/18/2023. Non-contrast head CT and CT angiogram head/neck 12/09/2023. Prior brain MRI examinations 12/09/2023 and earlier. FINDINGS: Brain: Stable cerebral volume. New from the recent prior brain MRI 12/09/2023, there is a focus of patchy restricted diffusion within the right basal ganglia measuring 3.2 x 1.3 cm. There is also a new punctate focus of restricted diffusion within the right caudate head (series 2, image 25). These foci are favored to reflect acute infarcts. The small focus of restricted diffusion demonstrated within the superior right temporal lobe on the prior brain MRI is no longer present. Otherwise stable signal abnormality within the right frontal lobe/insula, extending to the right temporal lobe, region of the right deep gray nuclei, right periatrial white matter, anterior body/genu of corpus callosum and slightly into the adjacent anterior left frontal lobe white matter. Superimposed patchy and irregular foci of diffusion-weighted signal abnormality at these sites are otherwise unchanged and these findings are consistent with a combination of tumor and post-treatment changes. Associated foci of dystrophic calcification and likely chronic hemosiderin deposition. Unchanged focus of chronic encephalomalacia/gliosis within the anterolateral right frontal lobe/insula. Redemonstrated tract (with associated chronic  hemosiderin deposition) in the right frontal lobe, presumably from prior biopsy. Ex vacuo dilatation of the right lateral ventricle. Unchanged chronic T2 FLAIR hyperintense signal changes within the pons, nonspecific No extra-axial fluid collection. No midline shift. Vascular: Maintained flow voids within the proximal large arterial vessels. Skull and upper cervical spine: No focal worrisome marrow lesion. Sinuses/Orbits: No mass or acute finding within the imaged orbits. Mucosal thickening within  the bilateral frontal sinuses (mild right, moderate left). Severe bilateral ethmoid sinusitis. Mild mucosal thickening within the bilateral maxillary sinuses. Other: Trace fluid within left mastoid air cells. IMPRESSION: 1. 3.2 x 1.3 cm focus of patchy restricted diffusion within the right basal ganglia, new from the recent prior brain MRI of 12/09/2023. New punctate focus of restricted diffusion within the right caudate head. Given the appearance and rapid development, these findings are strongly favored to reflect acute infarcts. However, a short-interval follow-up brain MRI (in 6 weeks) is recommended to exclude any tumor progression. 2. A small focus of restricted diffusion demonstrated within the right temporal lobe on the prior MRI is no longer present. This likely reflected an acute infarct on the prior exam. 3. Otherwise stable appearance of the patient's known tumor and post-treatment changes. 4. Paranasal sinus disease as described. Electronically Signed   By: Rockey Childs D.O.   On: 12/18/2023 16:47   CT HEAD WO CONTRAST Result Date: 12/18/2023 CLINICAL DATA:  Neuro deficit, acute, stroke suspected. History of oligodendroglioma status post treatment. EXAM: CT HEAD WITHOUT CONTRAST TECHNIQUE: Contiguous axial images were obtained from the base of the skull through the vertex without intravenous contrast. RADIATION DOSE REDUCTION: This exam was performed according to the departmental dose-optimization program  which includes automated exposure control, adjustment of the mA and/or kV according to patient size and/or use of iterative reconstruction technique. COMPARISON:  CT of the head dated December 09, 2023. FINDINGS: Brain: Moderate generalized is cerebral volume loss. Curvilinear areas of calcification are again demonstrated within the periventricular and subcortical white matter of the right frontal lobe. Dystrophic calcifications are also seen anteromedial to the atrium of the right lateral ventricle. There are encephalomalacia changes and ex vacuo dilatation of the right lateral ventricle, which appears similar to the prior exam. There is no evidence of acute hemorrhage or infarction. Vascular: Moderate calcific atheromatous disease within the carotid siphons and right vertebral artery. Skull: Right frontal burr craniotomy defects. The calvaria is otherwise intact. No osseous lesions present. Sinuses/Orbits: Moderate mucosal disease within the frontal, ethmoid and maxillary sinuses. The orbits are unremarkable. Other: None. IMPRESSION: 1. Stable encephalomalacia changes and dystrophic calcification within the right cerebral hemisphere, compatible with previously treated neoplasm. No adverse interval change. 2. Mild-to-moderate paranasal sinus disease. Electronically Signed   By: [name] M.D.   On: 12/18/2023 15:37   DG Swallowing Func-Speech Pathology Result Date: 12/10/2023 Table formatting from the original result was not included. Modified Barium Swallow Study Patient Details Name: QAIS JOWERS MRN: [id number] Date of Birth: 02-24-1971 Today's Date: 12/10/2023 HPI/PMH: HPI: Per MD progress note, Alan ZAPPIA is a 53 y.o. Caucasian male with medical history significant for oligodendroglioma, GERD, type 2 diabetes mellitus, dyslipidemia and urolithiasis, presented to the emergency room with acute onset of altered mental status and recently diminished appetite over the last 4 days.  He saw his PCP  earlier this week and had an MRI as he has been having slowly increasing weakness and difficulty with his speech which has been more difficult to understand.  He has not been ambulating as usual.  He has been more fatigued and tired and somnolent. MRI 12/09/23: New punctate focus of restricted diffusion in the anterior right  temporal lobe could represent a small acute infarct (favored) or  new/progressive small site of tumor.  2. Otherwise, unchanged appearance of multifocal signal abnormality  restricted diffusion in the right frontal and right temporal lobes  and corpus callosum, compatible with known tumor.  CXR 6/17: No active disease. Clinical Impression: Clinical Impression: Pt seen for MBSS demonstrating mild pharyngoesophageal phase dysphagia. Pt presents with improvement in pharyngeal/esophageal phase of swallow in comparison with MBSS completed in March 2025. This date pt with continued residue at the UES and reduced distention of UES coupled with retention in the esophagus for more viscous substances- though no retrograde movement. Residue spilling into pyriform sinus from the UES space, though not continuing to the laryngeal vestibule. No overt airway compromise across consistencies. Cough noted x1 without evidence of penetration/aspiration. Oral phase grossly intact.     Despite lack of aspiration during study- given known choking and mild deficits related to UES distention/residue and reflux/retention- pt is at increased risk for aspiration, specifically with substances of increased viscosity. Trials of liquid wash and double swallow aided to reduce residue.     Continue to recommend dys 2 (chopped) solids to aid clearance and use of compensatory strategies- double swallow and liquid wash. Recommend aspiration precautions- slow rate, small bites, elevated HOB (for at least 30 min after swallow), and alert for PO intake. Pt and spouse aware of recommendations. RN and MD notified of results. Recommend  follow up with OP SLP services (pt is currently on OP caseload). Factors that may increase risk of adverse event in presence of aspiration [name] & [name] 2021): Factors that may increase risk of adverse event in presence of aspiration [name] & [name] 2021): Frail or deconditioned; Limited mobility (min deconditioned) Recommendations/Plan: Swallowing Evaluation Recommendations Swallowing Evaluation Recommendations Recommendations: PO diet PO Diet Recommendation: Dysphagia 2 (Finely chopped); Thin liquids (Level 0) Liquid Administration via: Cup; Straw Medication Administration: Whole meds with liquid Supervision: Patient able to self-feed; Intermittent supervision/cueing for swallowing strategies Swallowing strategies  : Slow rate; Small bites/sips; Follow solids with liquids Postural changes: Position pt fully upright for meals; Stay upright 30-60 min after meals Oral care recommendations: Oral care BID (2x/day) Treatment Plan Treatment Plan Treatment recommendations: Defer treatment plan to SLP at other venue (see follow-up recommendations) (follow up with OP SLP) Follow-up recommendations: Follow physicians's recommendations for discharge plan and follow up therapies (return to OP SLP services) Functional status assessment: -- (pt appears to be approaching baseline) Recommendations Recommendations for follow up therapy are one component of a multi-disciplinary discharge planning process, led by the attending physician.  Recommendations may be updated based on patient status, additional functional criteria and insurance authorization. Assessment: Orofacial Exam: Orofacial Exam Oral Cavity: Oral Hygiene: WFL Oral Cavity - Dentition: Adequate natural dentition Orofacial Anatomy: WFL Oral Motor/Sensory Function: WFL (pt indicating residual left orofacial weakness) Anatomy: Anatomy: WFL Boluses Administered: Boluses Administered Boluses Administered: Thin liquids (Level 0); Mildly thick liquids (Level 2, nectar  thick); Puree; Solid  Oral Impairment Domain: Oral Impairment Domain Lip Closure: No labial escape Tongue control during bolus hold: Escape to lateral buccal cavity/floor of mouth Bolus preparation/mastication: Timely and efficient chewing and mashing Bolus transport/lingual motion: Brisk tongue motion Oral residue: Trace residue lining oral structures Location of oral residue : Tongue Initiation of pharyngeal swallow : Posterior angle of the ramus  Pharyngeal Impairment Domain: Pharyngeal Impairment Domain Soft palate elevation: No bolus between soft palate (SP)/pharyngeal wall (PW) Laryngeal elevation: Complete superior movement of thyroid  cartilage with complete approximation of arytenoids to epiglottic petiole Anterior hyoid excursion: Complete anterior movement Epiglottic movement: Complete inversion Laryngeal vestibule closure: Complete, no air/contrast in laryngeal vestibule Pharyngeal stripping wave : Present - complete Pharyngeal contraction (A/P view only): N/A Pharyngoesophageal segment opening: Partial distention/partial duration, partial obstruction of  flow Tongue base retraction: No contrast between tongue base and posterior pharyngeal wall (PPW) Pharyngeal residue: Trace residue within or on pharyngeal structures Location of pharyngeal residue: Pyriform sinuses (trace)  Esophageal Impairment Domain: Esophageal Impairment Domain Esophageal clearance upright position: Esophageal retention Pill: Pill Consistency administered: -- (n/a) Penetration/Aspiration Scale Score: Penetration/Aspiration Scale Score 1.  Material does not enter airway: Thin liquids (Level 0); Mildly thick liquids (Level 2, nectar thick); Puree; Solid Compensatory Strategies: Compensatory Strategies Compensatory strategies: No   General Information: Caregiver present: No  Diet Prior to this Study: Thin liquids (Level 0); Dysphagia 2 (finely chopped)   Temperature : Normal   Respiratory Status: WFL   Supplemental O2: None (Room air)    History of Recent Intubation: No  Behavior/Cognition: Alert; Cooperative Self-Feeding Abilities: Able to self-feed Baseline vocal quality/speech: Normal Volitional Cough: Able to elicit Volitional Swallow: Able to elicit Exam Limitations: No limitations Goal Planning: Prognosis for improved oropharyngeal function: -- (defer until completion of MBSS) No data recorded No data recorded Patient/Family Stated Goal: to eat as able Consulted and agree with results and recommendations: Patient; Family member/caregiver Pain: Pain Assessment Pain Assessment: No/denies pain End of Session: Start Time:SLP Start Time (ACUTE ONLY): 1150 Stop Time: SLP Stop Time (ACUTE ONLY): 1235 Time Calculation:SLP Time Calculation (min) (ACUTE ONLY): 45 min Charges: SLP Evaluations $ SLP Speech Visit: 1 Visit SLP Evaluations $BSS Swallow: 1 Procedure $MBS Swallow: 1 Procedure SLP visit diagnosis: SLP Visit Diagnosis: Dysphagia, pharyngoesophageal phase (R13.14) Past Medical History: Past Medical History: Diagnosis Date  Allergy   Complication of anesthesia   pt reports he is starting to have more difficulty arousing after surgery  Diabetes mellitus (HCC)   GERD (gastroesophageal reflux disease)   Headache   History of kidney stones   Hyperlipidemia   Renal disorder   kidney stones Past Surgical History: Past Surgical History: Procedure Laterality Date  APPLICATION OF CRANIAL NAVIGATION Right 01/17/2021  Procedure: APPLICATION OF CRANIAL NAVIGATION;  Surgeon: Debby Dorn MATSU, MD;  Location: [facility] OR;  Service: Neurosurgery;  Laterality: Right;  COLONOSCOPY  09/03/1994  COLONOSCOPY WITH PROPOFOL  N/A 01/07/2023  Procedure: COLONOSCOPY WITH PROPOFOL ;  Surgeon: Jinny Carmine, MD;  Location: [facility] ENDOSCOPY;  Service: Endoscopy;  Laterality: N/A;  NOT TOO EARLY  CYSTOSCOPY WITH STENT PLACEMENT Right 01/25/2016  Procedure: CYSTOSCOPY WITH STENT PLACEMENT;  Surgeon: Redell Lynwood Napoleon, MD;  Location: [facility] ORS;  Service: Urology;  Laterality: Right;   FRAMELESS  BIOPSY WITH BRAINLAB Right 01/17/2021  Procedure: RIGHT STEREOTACTIC BIOPSY OF INSULAR LESION;  Surgeon: Debby Dorn MATSU, MD;  Location: [facility] OR;  Service: Neurosurgery;  Laterality: Right;  KNEE ARTHROSCOPY W/ MENISCAL REPAIR Right 06/23/1988  POLYPECTOMY  01/07/2023  Procedure: POLYPECTOMY;  Surgeon: Jinny Carmine, MD;  Location: [facility] ENDOSCOPY;  Service: Endoscopy;;  removal of birthmark  06/08/2013  SKIN CANCER EXCISION  06/23/2012  TYMPANOSTOMY TUBE PLACEMENT  08/06/1978  URETEROSCOPY WITH HOLMIUM LASER LITHOTRIPSY Right 01/25/2016  Procedure: URETEROSCOPY WITH HOLMIUM LASER LITHOTRIPSY;  Surgeon: Redell Lynwood Napoleon, MD;  Location: [facility] ORS;  Service: Urology;  Laterality: Right;  URETHRAL STRICTURE DILATATION    visual inspection of vocal cord    1973  WISDOM TOOTH EXTRACTION   [place] Jarrett Clapp, MS, CCC-SLP Speech Language Pathologist Rehab Services; [facility] [place] [phone] (ascom) [place] J Clapp 12/10/2023, 12:54 PM  CT ANGIO HEAD NECK W WO CM Result Date: 12/09/2023 CLINICAL DATA:  Stroke/TIA, determine embolic source. Altered mental status, behavioral change since yesterday, increased slurred speech and gait changes. EXAM: CT  ANGIOGRAPHY HEAD AND NECK WITH AND WITHOUT CONTRAST TECHNIQUE: Multidetector CT imaging of the head and neck was performed using the standard protocol during bolus administration of intravenous contrast. Multiplanar CT image reconstructions and MIPs were obtained to evaluate the vascular anatomy. Carotid stenosis measurements (when applicable) are obtained utilizing NASCET criteria, using the distal internal carotid diameter as the denominator. RADIATION DOSE REDUCTION: This exam was performed according to the departmental dose-optimization program which includes automated exposure control, adjustment of the mA and/or kV according to patient size and/or use of iterative reconstruction technique. CONTRAST:  75mL OMNIPAQUE  IOHEXOL  350 MG/ML SOLN COMPARISON:  MRI  head earlier same day. FINDINGS: CT HEAD FINDINGS Brain: No acute intracranial hemorrhage. Heterogeneous areas of mineralization within the right frontal lobe with associated edema corresponding to findings of tumor on same day MRI. Similar focus of mineralization and edema within the right temporal periventricular white matter. Nonspecific hypoattenuation in the periventricular and subcortical white matter favored to reflect chronic microvascular ischemic changes. Generalized parenchymal volume loss. The basilar cisterns are patent. Posterior fossa is unremarkable. Ventricles: Ventricles are normal in size and configuration. Vascular: No hyperdense vessel. Intracranial atherosclerotic calcifications noted. Skull: No acute or aggressive finding. Sinuses/orbits: Orbits are symmetric. Mucosal thickening in the ethmoid sinuses, left greater than right. Other: Mastoid air cells are clear. CTA NECK FINDINGS Aortic arch: Standard configuration of the aortic arch. Imaged portion shows no evidence of aneurysm or dissection. No significant stenosis of the major arch vessel origins. Pulmonary arteries: As permitted by contrast timing, there are no filling defects in the visualized pulmonary arteries. Subclavian arteries: The subclavian arteries are patent bilaterally. Right carotid system: Patent. Mild atherosclerosis at the carotid bifurcation without hemodynamically significant stenosis. No evidence of dissection. Left carotid system: Patent. Mild atherosclerosis at the carotid bifurcation without hemodynamically significant stenosis. No evidence of dissection. Vertebral arteries: Codominant. No evidence of dissection, stenosis (50% or greater), or occlusion. Atherosclerosis at the left vertebral artery origin resulting in mild stenosis. Skeleton: No acute or aggressive finding noted. Other neck: The visualized airway is patent. No cervical lymphadenopathy. Upper chest: Visualized lung apices are clear. Review of the MIP  images confirms the above findings CTA HEAD FINDINGS ANTERIOR CIRCULATION: The intracranial ICAs are patent bilaterally. Mild atherosclerosis of the carotid siphons. No significant stenosis, proximal occlusion, aneurysm, or vascular malformation. MCAs: Patent bilaterally. Mild to moderate multifocal narrowing of an M2 inferior division branch of the right MCA. ACAs: Patent bilaterally. 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 There is severe narrowing of the A3 and proximal A4 segments of the right ACA. POSTERIOR CIRCULATION: No significant stenosis, proximal occlusion, aneurysm, or vascular malformation. PCAs: The PCAs are patent proximally. Fetal origin of both PCAs. There is severe stenosis and occlusion of the P3 segment of the right PCA. Few small caliber distal PCA branches noted likely perfused by collaterals. Pcomm: The posterior communicating arteries are visualized bilaterally. SCAs: The superior cerebellar arteries are patent bilaterally. Basilar artery: Patent.  Distal tapering of the basilar artery. AICAs: Not well visualized. PICAs: Patent Vertebral arteries: The intracranial vertebral arteries are patent. Venous sinuses: As permitted by contrast timing, patent. Anatomic variants: None Review of the MIP images confirms the above findings IMPRESSION: No CT evidence of acute intracranial abnormality. Heterogeneous areas of mineralization and surrounding edema in the right frontal lobe and right temporal periventricular white matter corresponding to regions of tumor noted on same day MRI. 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 Intracranial  arterial vasculature is otherwise patent. 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 Mild-to-moderate multifocal stenosis of an M2 inferior division branch of the right MCA. No high-grade stenosis of the arteries in the neck. Electronically Signed   By: Donnice Mania M.D.   On: 12/09/2023 13:48    MR Brain W and Wo Contrast Result Date: 12/09/2023 CLINICAL DATA:  Delirium had MRI about 1 week ago, since then has had increased difficulty speaking, R leg weakness, increased somnolence. eval for stroke, edema, tumor progression etc EXAM: MRI HEAD WITHOUT AND WITH CONTRAST TECHNIQUE: Multiplanar, multiecho pulse sequences of the brain and surrounding structures were obtained without and with intravenous contrast. CONTRAST:  6mL GADAVIST  GADOBUTROL  1 MMOL/ML IV SOLN COMPARISON:  MRI head December 03, 2023. FINDINGS: Brain: New punctate focus of restricted diffusion in the anterior right temporal lobe (series 5, image 28). Resolution of the previously seen small focus of restricted diffusion in the right corona radiata. Additional areas of masslike restricted diffusion and signal abnormality involving the right frontal lobe, genu of the corpus callosum extending into the left frontal lobe, and right periatrial region, compatible with known tumor. More facet areas of T1 hyperintensity in the right frontal lobe right periatrial region are unchanged. No substantial mass effect or midline shift. No hydrocephalus. Similar cerebral atrophy. Vascular: Major arterial flow voids are maintained. Skull and upper cervical spine: Normal marrow signal. Sinuses/Orbits: Negative. IMPRESSION: 1. New punctate focus of restricted diffusion in the anterior right temporal lobe could represent a small acute infarct (favored) or new/progressive small site of tumor. 2. Otherwise, unchanged appearance of multifocal signal abnormality restricted diffusion in the right frontal and right temporal lobes and corpus callosum, compatible with known tumor. Electronically Signed   By: Gilmore GORMAN Molt M.D.   On: 12/09/2023 01:17   CT Head Wo Contrast Result Date: 12/08/2023 CLINICAL DATA:  Delirium altered EXAM: CT HEAD WITHOUT CONTRAST TECHNIQUE: Contiguous axial images were obtained from the base of the skull through the vertex without  intravenous contrast. RADIATION DOSE REDUCTION: This exam was performed according to the departmental dose-optimization program which includes automated exposure control, adjustment of the mA and/or kV according to patient size and/or use of iterative reconstruction technique. COMPARISON:  MRI 12/03/2023, CT brain 09/09/2023 FINDINGS: Brain: Amorphous hyperdensity in the right frontal and temporal lobes, progressive compared with head CT from March and favored to represent mineralization over hemorrhage, and corresponding to history of known brain lesions. White matter hypodensity again visible in the right frontal and temporal lobes as well as right insula. Areas of cystic encephalomalacia as before. Grossly stable size and configuration of the ventricles with ex vacuo dilatation on the right. Vascular: No hyperdense vessels.  Carotid vascular calcification Skull: No fracture. Sinuses/Orbits: No acute finding. Mild mucosal thickening in the sinuses Other: None IMPRESSION: Amorphous hyperdensity in the right frontal and temporal lobes, progressive compared with head CT from March and favored to represent mineralization over hemorrhage, and corresponding to history of known brain lesions. Other grossly stable areas of white matter hypodensity as demonstrated on recent MRI imaging. Stable ventricle size and morphology compared to prior. Electronically Signed   By: Luke Bun M.D.   On: 12/08/2023 21:58   DG Chest Portable 1 View Result Date: 12/08/2023 CLINICAL DATA:  Altered mental status. EXAM: PORTABLE CHEST 1 VIEW COMPARISON:  September 09, 2023 FINDINGS: The heart size and mediastinal contours are within normal limits. Both lungs are clear. The visualized skeletal structures are unremarkable.  IMPRESSION: No active disease. Electronically Signed   By: Suzen Dials M.D.   On: 12/08/2023 21:30    ASSESSMENT & PLAN:  Glioma Stroke  Alan Wise presents with nausea and vomiting which is thus far  refractory to standing zofran .  Compazine  was not well tolerated and was resumed at lower PRN dose.  Brain MRI was reviewed, does not demonstrate any new or progressive process that would account for these symptoms.  Swallowing deficits can be explained by his initial infarct, but he passed modified barium swallow on 12/24/23.  Contrast was not administered for the MRI study.  Recommended the following: -Continue decadron  2mg  daily -Check basic labs, TSH, T4, AM cortisol tomorrow -If persistent, would consider decreasing Lamictal  back to 150mg  BID given that was most recent medication change prior to symptom onset.  If GI symptoms improve but focal seizures recur, could add a low dose of a second agent such as Zonisamide.   -Could consider zyprexa 2.5 or 5mg  HS, which we use for refractory nausea/vomiting and also mood stabilization.  Previously he was poorly tolerant of Abilify  for mood.   -Consider GI consult, or repeating modified barium swallow if speech/swallow agreeable  Discussed case at length with wife, including overall goals of care.   All questions were answered. The patient knows to call the clinic with any problems, questions or concerns.  The total time spent in the encounter was 60 minutes and more than 50% was on counseling and review of test results     Arthea MARLA Manns, MD 01/05/2024 5:05 PM

## 2024-01-05 NOTE — Progress Notes (Signed)
 Physical Therapy Session Note  Patient Details  Name: Alan Wise MRN: [id number] Date of Birth: [birth year]  Today's Date: 01/05/2024 PT Individual Time: 0802-0914 PT Individual Time Calculation (min): 72 min   Today's Date: 01/05/2024 PT Individual Time: 1035-1100 PT Individual Time Calculation (min): 25 min   Short Term Goals: Week 2:  PT Short Term Goal 1 (Week 2): STGs = LTGs  Skilled Therapeutic Interventions/Progress Updates:     1st Session: Pt received semi reclined in bed and agrees to therapy. No indication of pain. Pt transitions to edge of bed with bed features and cues for positioning. Pt ambulates to toilet with CGA and cues for safety. Pt able to urinate in standing to work on standing balance and endurance. Following, pt ambulates to sink to wash hands with cues for posture. WC transport to gym. Pt trials hurrycane following PT demonstration of optimal use. Pt ambulates x200' with hurrycane and CGA, with improved gait pattern relative to previous sessions, though not utilizing cane consistently. Pt generally appears more energetic than previous sessions. PT then provides pt with RW and encourages trial with RW. Pt is vocally against use of RW but agreeable to try. Pt ambulates x200' with RW and close supervision, but continues to be resistant to idea of using RW, just stating No. Pt appears to prefer cane.  Pt completes alternating foot taps on 4 step with hurrycane in RUE. Pt completes x14 with each foot requiring close supervision and cues for positioning and posture, and then appears to fatigue with increased postural sway and instability. Pt has x2 LOBs with final reps, falling to the Lt and requiring totalA to prevent fall to ground. Pt appears to indicate that LOBs were associated with fatigue in Lt side. Following extended seated rest break, pt attempts foot taps again and cannot tap Rt foot while maintaining balance, consistently losing balance backward and to the  Lt. PT questions why pt feels that he is losing balance and pt appears frustrated and indicates that it is fatigue in LLE.   WC transport to gym for time management. Left seated in WC with all needs within reach.   2nd Session: Pt received supine in bed and agrees to therapy. Supine to sit with bed features. Pt performs sit to stand with minA and cues for initiation. Pt ambulates x150' with Rt HHA and minA to promote uprighe posture and lateral weight shifting. Pt completes Nustep for reciprocal coordination and Lt hemibody NMR. Pt performs x12:00 at workload of 4 with average steps per minute ~35. PT provides cues for hand and foot placement and completing full available ROM. PT also cues to attend to Lt hand with vision to promote increased NM feedback. Pt ambulates back to room with same assistance and cues. Left seated with all needs within reach.    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

## 2024-01-05 NOTE — Progress Notes (Signed)
 PROGRESS NOTE   Subjective/Complaints: No events overnight.  Had a good therapy session earlier this morning.  Noted on return to room from therapy gym this a.m., patient was pocketing food in his left cheek; on discussing with PT, he had spit out that out a large amount of chewed food at the beginning of their session into his hand.  SLP informed and will work on swallowing evaluation this afternoon.  BP elevated yesterday up to 170s; 130-150s baseline. Mild bradycardia. Other vitals stable PO intakes remain 5-25%; 300 ccs fluid intakes yesterday.  Labs from yesterday look stable.  Per documentation, emesis episode with OT yesterday.  Later this a.m., additional reported episode of nausea, leaning of the trash can for approximately 2 minutes without any emesis.  Patient returned to cognitive baseline immediately after episode.  Discussed with patient's wife this a.m.; significant concern about return to home in his current condition.  Discussed possibility of feeding tube; patient may tolerate temporary feeding tube but almost definitely would refuse long-term feeding tube.  Family denies any goals of care discussion so far regarding if he would want these types of interventions.  Patient's wife feeling significantly stressed, has few supports herself; has a church support group but has not been attending.   ROS: limited d/t aphasia -intermittent yes/no verbalizations, but will deny difficulty with swallowing, ongoing nausea, abdominal pain, or dizziness/lightheadedness..   Objective:   No results found.   Recent Labs    01/04/24 0510  WBC 8.3  HGB 15.6  HCT 44.2  PLT 213    Recent Labs    01/04/24 0510  NA 137  K 3.6  CL 100  CO2 28  GLUCOSE 98  BUN 13  CREATININE 0.98  CALCIUM  9.8     Intake/Output Summary (Last 24 hours) at 01/05/2024 0909 Last data filed at 01/04/2024 1756 Gross per 24 hour  Intake 300 ml   Output --  Net 300 ml         Physical Exam: Vital Signs Blood pressure 130/80, pulse 65, temperature 97.7 F (36.5 C), resp. rate 17, height 5' 8.5 (1.74 m), weight 59.6 kg, SpO2 95%.  Constitutional: No distress . Vital signs reviewed.  Sitting up at bedside. HEENT: NCAT, EOMI, oral membranes moist.  Pocketed partially chewed food in the left cheek. Neck: supple; ?Anteriocollis Cardiovascular: RRR without murmur. No JVD    Respiratory/Chest: CTA Bilaterally without wheezes or rales. Normal effort    GI/Abdomen: BS +, non-tender, non-distended Ext: no clubbing, cyanosis, or edema Psych: Intermittently agitated with poor frustration tolerance, otherwise appropriate. Neuro:  Awake, alert.  Unable to ascertain orientation due to significant aphasia.   Global aphasia, expressive greater than receptive.  Answers yes/no questions intermittently appropriately, perseverative speech.  L hemineglect --can attend to left side with verbal stimulation.  Strength: RUE-5 out of 5 throughout LUE-3/5 shoulder abduction, biceps 3/5; triceps 3/5; WE 1/5; grip 4/5; FA 1-2/5--limited by flexor synergy pattern and inattention, difficulty relaxing   RLE- 5-/5 throughout LLE- 3-4 proximally, 4/5 distally--- difficulty with relaxation and consistent activation Decreased sensation to light touch throughout the left hemibody Cranial nerves: Left facial droop, left tongue weakness, left shoulder droop persistent  Tone: No appreciable tone in left upper extremity; flexor synergy pattern--when able to relax, tone subsides.  Assessment/Plan: 1. Functional deficits which require 3+ hours per day of interdisciplinary therapy in a comprehensive inpatient rehab setting. Physiatrist is providing close team supervision and 24 hour management of active medical problems listed below. Physiatrist and rehab team continue to assess barriers to discharge/monitor patient progress toward functional and medical  goals  Care Tool:  Bathing    Body parts bathed by patient: Right arm, Left arm, Chest, Abdomen, Front perineal area, Right upper leg, Left upper leg, Right lower leg, Left lower leg, Face   Body parts bathed by helper: Buttocks, Right arm     Bathing assist Assist Level: Maximal Assistance - Patient 24 - 49%     Upper Body Dressing/Undressing Upper body dressing   What is the patient wearing?: Pull over shirt    Upper body assist Assist Level: Minimal Assistance - Patient > 75%    Lower Body Dressing/Undressing Lower body dressing      What is the patient wearing?: Underwear/pull up, Pants     Lower body assist Assist for lower body dressing: Minimal Assistance - Patient > 75%     Toileting Toileting    Toileting assist Assist for toileting: Moderate Assistance - Patient 50 - 74%     Transfers Chair/bed transfer  Transfers assist     Chair/bed transfer assist level: Contact Guard/Touching assist     Locomotion Ambulation   Ambulation assist      Assist level: Minimal Assistance - Patient > 75% Assistive device: No Device Max distance: 300'   Walk 10 feet activity   Assist     Assist level: Minimal Assistance - Patient > 75% Assistive device: No Device   Walk 50 feet activity   Assist    Assist level: Minimal Assistance - Patient > 75% Assistive device: No Device    Walk 150 feet activity   Assist    Assist level: Minimal Assistance - Patient > 75% Assistive device: No Device    Walk 10 feet on uneven surface  activity   Assist Walk 10 feet on uneven surfaces activity did not occur: Safety/medical concerns         Wheelchair     Assist Is the patient using a wheelchair?: Yes      Wheelchair assist level: Dependent - Patient 0% Max wheelchair distance: 200 ft    Wheelchair 50 feet with 2 turns activity    Assist        Assist Level: Dependent - Patient 0%   Wheelchair 150 feet activity      Assist      Assist Level: Dependent - Patient 0%   Blood pressure 130/80, pulse 65, temperature 97.7 F (36.5 C), resp. rate 17, height 5' 8.5 (1.74 m), weight 59.6 kg, SpO2 95%.  Medical Problem List and Plan: 1. 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 PT improving. SLP appears similar to last admission - per SLP was making progress in OP prior to this - very limited frustration tolerance.   -7/7: Episode of extreme lethargy with left upper extremity movements  concerning for seizure after receiving 10 mg of IV Compazine .  EEG with cortical dysfunction from the right frontotemporal region, postictal state, no epileptiform discharges--was in place during another similar event.  CT head unchanged.  Neurology consulted/curbside, likely combination of postictal and medication sensitivity, increasing Lamictal  and reduce Compazine  to 5 mg dosing.  Patient monitored throughout the afternoon, improved. 7-9: Will complete family training remotely due to difficulty coordinating family  7-10:Discussed case with Dr. Buckley; agree that patient is a challenging case with complex intracranial pathology, agree with MRI if nausea/dizziness does not improve in 2 to 3 days or if acute change in neurologic status.  Agree unlikely etiology of seizures.  Will treat with adjustment of antiemetics and start Decadron  1 mg for 3 days; keep low dose.   7/14--dc planned for tomorrow. Given his poor po intake and recent issues with nausea his progress has been slow. Spoke with his wife at length. I'm willing to keep him a bit longer to work on po intake and to improve his balance and safety before sending him home. See  nutrition discussion below    7/15: Discussing with Dr. Buckley, ordering MRI brain without contrast given worsening left-sided weakness/inattention and ongoing recurrent emesis.  A.m. cortisol, TSH, T4, CMP ordered.  Considering bolus of high-dose steroids followed by 2 mg daily Decadron  for appetite stimulation--will wait until a.m. cortisol test to not confound results.  Dr. Buckley visiting with patient/family today.   2.  Antithrombotics: -DVT/anticoagulation:  Pharmaceutical: Eliquis  5mg  BID- cannot stop             -antiplatelet therapy: N/A  3. Pain Management: Tylenol  PRN.   7-10: No complaints of pain, headaches with nausea or emesis.  4. Mood/Behavior/Sleep: LCSW to follow for evaluation and support.              -antipsychotic agents: N/A  -Melatonin PRN  -Reports last IRF admission mom encouraged him to stop his seizure medication. No issues this admission, will watch closely.   -7-10: Intermittent frustration but overall cooperative and participatory  5. Neuropsych/cognition: This patient is not fully capable of making decisions on his own behalf.  6. Skin/Wound Care: Routine pressure relief measure.    7. Fluids/Electrolytes/severe protein calorie malnutrition: monitor I/O. Routine labs. Cont supplements.   - 7-7 labs stable; placed on 100 mL/h's continuous IV fluids for 24 hours due to nausea/somnolence--DC'd 7-8 7-10: Labs stable.  Will give IV fluids today 75 cc/h given ongoing nausea/retching, poor p.o. intakes. P.o. intakes limited by lack of interest in food, will consult nutrition for a.m. and consider appetite stimulant if antinausea medications ineffective.  See #18. 7/11: Nutrition consult completed, appreciate recommendations.  Notable patient has had 11% weight loss in the last 3 months.  7/14- still eating next to nothing.po intake recorded at 50cc yesterday   -BMET ok today, add on prealbumin   -begin trial of megace    -staff and family will need to push  po   -?PEG  7/15: Megace  400 mg BID started yesterday; ate 25% lunch, 15% dinner.  Briefly discussed possibility of tube feeds with wife, may be agreeable to temporary nasogastric feeds but almost definitely patient would refuse PEG.  Wish to treat is much as possible with medications and workup first before considering tube feeds.  8. T2DM: Hgb A1c-5.4 and diet controlled.  -12/26/23 CBGs fine, not on SSI, no CBG checks ordered-- will tell nursing this is not needed.    9.  HTN: Monitor BP TID--avoid  hypoperfusion. Continue Amlodipine  5 mg/day and propranolol  20 mg bid. Blood pressure well-controlled on current regimen; some highs intermittently but resolve 7/9, 7/14   Vitals:   01/02/24 1319 01/02/24 1923 01/02/24 2102 01/03/24 0520  BP: (!) 156/84 129/68 129/68 (!) 142/81   01/03/24 1320 01/03/24 1924 01/03/24 2106 01/04/24 0457  BP: 137/77 (!) 142/87 (!) 142/87 (!) 172/80   01/04/24 1543 01/04/24 2051 01/04/24 2125 01/05/24 0553  BP: (!) 143/77 (!) 150/88 (!) 150/88 130/80    10. Dysphagia: NO straws to decrease aspiration events.  --GI consult recommended as retention of solids contribute to aspiration. Per Dr. Lizabeth note this was discussed with GI and who felt patient to be too high risk for GI procedures and recommended follow up after d/c. -SLP following --no complaints or concerns for aspiration this admission 7-15: Noticed pocketing with therapies today, new behavior.  SLP informed and to evaluate this afternoon.  11. Seizure d/o; On Lamictal  150 mg am and 200 mg pm-->educate on compliance.  7/4- pt so far is taking meds-  7-7: Lamictal  increased to 200 mg twice daily per neurology due to concern for postictal state as above--no reports of recurrent seizures  7/15: Recurrent episodes of emesis/nausea without post-event lethargy as seen prior, not improved with seizure medication adjustment--discussed with wife, low clinical suspicion for seizure etiology   12. Oligodendroglioma  c/b radiation necrosis: Follow up with Dr. 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    - 7/15: MRI brain today; Dr. Buckley assisting as above. Appreciate his insight and recommendations.    13. Dyslipidemia: LDL-129 and HDL 27. On Lipitor 40 mg/day.   14. Frequency/Nocturia: Gets up at nights and pt/wife agreed to toileting at 11 pm and 3 am to prevent attempts out of bed.  7/4- pt refused these scheduled get up times, but fell- asked nursing to try and get him up for nocturia Continent of bladder  15. Spasticity - concerned about starting Any spasticity meds due to risk of sedation/confusion - suggest to be assessed for Botulinum toxin for LUE.  7-7: Minimal tone in left upper extremity, and functional strength in limb; perceived pout tone is mostly flexor posturing with left inattention, which he can break with volitional relaxation.  Feel recovery would be stymied by use of Botox at this time, continue conservative management.    7/15: Ongoing flexor synergy which waxes/wanes with inattention; no appreciable spasticity on exam  16. L inattention/neglect-secondary to #1.  17. Bowels: no BM documented since 7/1 but pt states he's going daily, however history is limited d/t aphasia. Adamant that he had a BM yesterday. Asked nursing to please document.  -01/02/24 no BM documented in 3 days, will go back to scheduled miralax  daily -01/03/24 still no BM documented but pt very adamant he had a BM yesterday; will leave meds as is for now, but would monitor closely  7/14 had bm 7/12. Eating nothing though, wouldn't expect much  18.  Nausea/dizziness -recurrent, central vertigo versus gastric irritation from poor p.o. intake/high pill burden  7-7: Compazine  reduce to 5 mg p.o./IV due to somnolence.-->  2.5 mg p.o. as needed, with benefit 7-8  7-10: Discussed case with Dr. Buckley as above; hold off on MRI for now, interventions as follows   Protonix  40 mg twice daily for GI protection   Scheduled Zofran  8 mg every 8  hours; will leave Compazine  as as needed   Decadron  1 mg daily for 3 days   Scopolamine  patch every 72 hours--will hold off on initiating  until a.m., to monitor improvement with other measures   IV fluids normal saline 75 cc/h tonight as above   PT vestibular evaluation ordered--will hold off for today given severity of symptoms   7-11: Doing much better with measures as described above, is eating today.  No further dizziness.  Will DC IV fluids, if any issues over the weekend would start scopolamine  patch and consider MRI brain.  -01/04/24 no complaints again this morning, monitor. See nutrition discussion above  7/15: emesis yesterday, nausea today, PO intakes remain poor. Possible high-dose steroids pending AM cortisol as above.      LOS: 12 days A FACE TO FACE EVALUATION WAS PERFORMED  Joesph JAYSON Likes 01/05/2024, 9:09 AM

## 2024-01-06 ENCOUNTER — Other Ambulatory Visit (HOSPITAL_COMMUNITY): Payer: Self-pay

## 2024-01-06 ENCOUNTER — Telehealth (HOSPITAL_COMMUNITY): Payer: Self-pay | Admitting: Pharmacy Technician

## 2024-01-06 LAB — COMPREHENSIVE METABOLIC PANEL WITH GFR
ALT: 37 U/L (ref 0–44)
AST: 21 U/L (ref 15–41)
Albumin: 3.9 g/dL (ref 3.5–5.0)
Alkaline Phosphatase: 80 U/L (ref 38–126)
Anion gap: 11 (ref 5–15)
BUN: 15 mg/dL (ref 6–20)
CO2: 29 mmol/L (ref 22–32)
Calcium: 9.9 mg/dL (ref 8.9–10.3)
Chloride: 99 mmol/L (ref 98–111)
Creatinine, Ser: 1.08 mg/dL (ref 0.61–1.24)
GFR, Estimated: >60 mL/min (ref >60)
Glucose, Bld: 134 mg/dL — ABNORMAL HIGH (ref 70–99)
Potassium: 3.4 mmol/L — ABNORMAL LOW (ref 3.5–5.1)
Sodium: 139 mmol/L (ref 135–145)
Total Bilirubin: 0.7 mg/dL (ref 0.0–1.2)
Total Protein: 6.5 g/dL (ref 6.5–8.1)

## 2024-01-06 LAB — T4, FREE: Free T4: 1.13 ng/dL — ABNORMAL HIGH (ref 0.61–1.12)

## 2024-01-06 LAB — CORTISOL: Cortisol, Plasma: 1.6 ug/dL

## 2024-01-06 LAB — TSH: TSH: 1.562 u[IU]/mL (ref 0.350–4.500)

## 2024-01-06 MED ORDER — SCOPOLAMINE 1 MG/3DAYS TD PT72
1.0000 | MEDICATED_PATCH | TRANSDERMAL | Status: DC
  Administered 2024-01-06: 1.5 mg via TRANSDERMAL
  Filled 2024-01-06: qty 1

## 2024-01-06 MED ORDER — DEXAMETHASONE 2 MG PO TABS
2.0000 mg | ORAL_TABLET | Freq: Every day | ORAL | Status: DC
  Administered 2024-01-06 – 2024-01-07 (×2): 2 mg via ORAL
  Filled 2024-01-06 (×3): qty 1

## 2024-01-06 MED ORDER — LAMOTRIGINE 150 MG PO TABS
150.0000 mg | ORAL_TABLET | Freq: Two times a day (BID) | ORAL | Status: DC
  Administered 2024-01-06 – 2024-01-11 (×11): 150 mg via ORAL
  Filled 2024-01-06 (×12): qty 1

## 2024-01-06 NOTE — Progress Notes (Signed)
 Speech Language Pathology Daily Session Note  Patient Details  Name: UNDREA SHIPES MRN: [id number] Date of Birth: 09-11-1970  Today's Date: 01/06/2024 SLP Individual Time: 1500-1530 SLP Individual Time Calculation (min): 30 min  Short Term Goals: Week 2: SLP Short Term Goal 1 (Week 2): STGs = LTGs d/t ELOS  Skilled Therapeutic Interventions:   Pt greeted at bedside. He was overall cooperative throughout tx tasks targeting dysphagia and language. SLP facilitated diagnostic tx task targeting diet tolerance given report of L buccal pocketing yesterday. He consumed Dys 3 textures (goldfish) and thin liquids via cup. Slight throat clear x1 noted w/ goldfish, however, pt presents w/ intermittent coughing/throat clearing at baseline d/t esophageal dysphagia. No other overt s/s of aspiration noted. Recommend continuation of Dys 3 textures and thin liquids via cup. Meds whole w/ liquids. Intermittent supervision during meals, check oral cavity for any remaining food after completion of PO intake. MBSS not warranted at this time.  After PO trials, he completed expressive language tasks. He benefited from maxA during responsive naming task and required only s cues during automatics task (1-10). SLP also provided simple calculations in attempts to elicit automatic responses given pt's preference for #s/math. He benefited from modA to verbalize answers throughout. SLP also provided pt w/ continued education re language tasks to complete in the home environment. At the end of tasks, he was  left in bed with the alarm set ad call light within reach. Recommend cont ST.   Pain  No pain reported  Therapy/Group: Individual Therapy  [name] 01/06/2024, 3:40 PM

## 2024-01-06 NOTE — Progress Notes (Signed)
 PROGRESS NOTE   Subjective/Complaints:  No events overnight. No nausea or emesis reported this AM. MRI brain and labs yesterday unrevealing; TSH normal; Cortisol pending Patient was aggitated about something this AM per therapies but unable to indicate what.  He denies needs or concerns on exam.   ROS: limited d/t aphasia   Objective:   MR BRAIN WO CONTRAST Result Date: 01/05/2024 CLINICAL DATA:  Neuro deficit, acute, stroke suspected hx CVA and radiation necrosis; worsening nausea and L inattention/weakness EXAM: MRI HEAD WITHOUT CONTRAST TECHNIQUE: Multiplanar, multiecho pulse sequences of the brain and surrounding structures were obtained without intravenous contrast. COMPARISON:  CT of the head dated December 28, 2023 and MRI of the head dated December 23, 2023. FINDINGS: Brain: There has been interval evolution of nonhemorrhagic infarcts involving the right cingulate gyrus and the right corona radiata and basal ganglia, as well as the left lentiform nucleus. There is residual increased diffusion signal within the previously treated right frontal lobe mass, which continues to demonstrate hemosiderin staining and surrounding T2 signal hyperintensity. There are no new areas of restricted diffusion present. There is generalized cerebral volume loss and there are confluent zones of increased T2 signal in the periventricular white matter. There is will air in degeneration of the right cerebral peduncle. Vascular: Normal flow voids. Skull and upper cervical spine: Normal marrow signal. No osseous lesions. Sinuses/Orbits: Mild mucosal disease within the ethmoid air cells and left maxillary sinus. Normal orbits. Other: None. IMPRESSION: 1. Interval evolution of the nonhemorrhagic infarcts involving the right single lid gyrus, right basal ganglia and periventricular white matter and left lentiform nucleus. 2. Stable appearance of right frontal lobe previously  treated neoplasm, with hemosiderin staining from prior hemorrhage and surrounding gliosis. No adverse interval change. Electronically Signed   By: [name] M.D.   On: 01/05/2024 15:46     Recent Labs    01/04/24 0510  WBC 8.3  HGB 15.6  HCT 44.2  PLT 213    Recent Labs    01/04/24 0510 01/06/24 0434  NA 137 139  K 3.6 3.4*  CL 100 99  CO2 28 29  GLUCOSE 98 134*  BUN 13 15  CREATININE 0.98 1.08  CALCIUM  9.8 9.9     Intake/Output Summary (Last 24 hours) at 01/06/2024 0802 Last data filed at 01/05/2024 2014 Gross per 24 hour  Intake 240 ml  Output --  Net 240 ml         Physical Exam: Vital Signs Blood pressure (!) 154/86, pulse (!) 57, temperature 98.6 F (37 C), temperature source Oral, resp. rate 14, height 5' 8.5 (1.74 m), weight 59.6 kg, SpO2 96%.  Constitutional: No distress . Vital signs reviewed.  Sitting up in therapy gym.  HEENT: NCAT, EOMI, oral membranes moist.  Pocketed partially chewed food in the left cheek. Neck: supple; ?Anteriocollis Cardiovascular: RRR without murmur. No JVD    Respiratory/Chest: CTA Bilaterally without wheezes or rales. Normal effort    GI/Abdomen: BS +, non-tender, non-distended Ext: no clubbing, cyanosis, or edema Psych: Intermittently agitated with poor frustration tolerance, otherwise appropriate.  Neuro:  Awake, alert.   Global aphasia, expressive greater than receptive.  Answers yes/no questions  intermittently appropriately, perseverative speech. --unchanged L hemineglect --can attend to left side with verbal stimulation--improved today from yesterday  Strength:Antigravity all 4 extremities; LUE and LLE weakness; Min A ambulation d/t L foot drop/catch  Prior exams: RUE-5 out of 5 throughout LUE-3/5 shoulder abduction, biceps 3/5; triceps 3/5; WE 1/5; grip 4/5; FA 1-2/5-  RLE- 5-/5 throughout LLE- 3-4 proximally, 4/5 distally--- difficulty with relaxation and consistent activation  Decreased sensation to  light touch throughout the left hemibody Cranial nerves: Left facial droop, left tongue weakness, left shoulder droop persistent Tone: No appreciable tone in left upper extremity; flexor synergy pattern--when able to relax, tone subsides.  Assessment/Plan: 1. Functional deficits which require 3+ hours per day of interdisciplinary therapy in a comprehensive inpatient rehab setting. Physiatrist is providing close team supervision and 24 hour management of active medical problems listed below. Physiatrist and rehab team continue to assess barriers to discharge/monitor patient progress toward functional and medical goals  Care Tool:  Bathing    Body parts bathed by patient: Right arm, Left arm, Chest, Abdomen, Front perineal area, Right upper leg, Left upper leg, Right lower leg, Left lower leg, Face   Body parts bathed by helper: Buttocks, Right arm     Bathing assist Assist Level: Maximal Assistance - Patient 24 - 49%     Upper Body Dressing/Undressing Upper body dressing   What is the patient wearing?: Pull over shirt    Upper body assist Assist Level: Minimal Assistance - Patient > 75%    Lower Body Dressing/Undressing Lower body dressing      What is the patient wearing?: Underwear/pull up, Pants     Lower body assist Assist for lower body dressing: Minimal Assistance - Patient > 75%     Toileting Toileting    Toileting assist Assist for toileting: Moderate Assistance - Patient 50 - 74%     Transfers Chair/bed transfer  Transfers assist     Chair/bed transfer assist level: Contact Guard/Touching assist     Locomotion Ambulation   Ambulation assist      Assist level: Minimal Assistance - Patient > 75% Assistive device: No Device Max distance: 300'   Walk 10 feet activity   Assist     Assist level: Minimal Assistance - Patient > 75% Assistive device: No Device   Walk 50 feet activity   Assist    Assist level: Minimal Assistance - Patient >  75% Assistive device: No Device    Walk 150 feet activity   Assist    Assist level: Minimal Assistance - Patient > 75% Assistive device: No Device    Walk 10 feet on uneven surface  activity   Assist Walk 10 feet on uneven surfaces activity did not occur: Safety/medical concerns         Wheelchair     Assist Is the patient using a wheelchair?: Yes      Wheelchair assist level: Dependent - Patient 0% Max wheelchair distance: 200 ft    Wheelchair 50 feet with 2 turns activity    Assist        Assist Level: Dependent - Patient 0%   Wheelchair 150 feet activity     Assist      Assist Level: Dependent - Patient 0%   Blood pressure (!) 154/86, pulse (!) 57, temperature 98.6 F (37 C), temperature source Oral, resp. rate 14, height 5' 8.5 (1.74 m), weight 59.6 kg, SpO2 96%.  Medical Problem List and Plan: 1. 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 PT improving. SLP appears similar to last admission - per SLP was making progress in OP prior to this - very limited frustration tolerance.   -7/7: Episode of extreme lethargy with left upper extremity movements concerning for seizure after receiving 10 mg of IV Compazine .  EEG with cortical dysfunction from the right frontotemporal region, postictal state, no epileptiform discharges--was in place during another similar event.  CT head unchanged.  Neurology consulted/curbside, likely combination of postictal and medication sensitivity, increasing Lamictal  and reduce Compazine  to 5 mg dosing.  Patient monitored throughout the afternoon, improved. 7-9: Will complete family training remotely due to difficulty coordinating  family  7-10:Discussed case with Dr. Buckley; agree that patient is a challenging case with complex intracranial pathology, agree with MRI if nausea/dizziness does not improve in 2 to 3 days or if acute change in neurologic status.  Agree unlikely etiology of seizures.  Will treat with adjustment of antiemetics and start Decadron  1 mg for 3 days; keep low dose.   7/14--dc planned for tomorrow. Given his poor po intake and recent issues with nausea his progress has been slow. Spoke with his wife at length. I'm willing to keep him a bit longer to work on po intake and to improve his balance and safety before sending him home. See nutrition discussion below    7/15: Discussing with Dr. Buckley, ordering MRI brain without contrast given worsening left-sided weakness/inattention and ongoing recurrent emesis.  A.m. cortisol, TSH, T4, CMP ordered.  Considering bolus of high-dose steroids followed by 2 mg daily Decadron  for appetite stimulation--will wait until a.m. cortisol test to not confound results.  Dr. Buckley visiting with patient/family today.   2.  Antithrombotics: -DVT/anticoagulation:  Pharmaceutical: Eliquis  5mg  BID- cannot stop             -antiplatelet therapy: N/A  3. Pain Management: Tylenol  PRN.   7-10: No complaints of pain, headaches with nausea or emesis.  4. Mood/Behavior/Sleep: LCSW to follow for evaluation and support.              -antipsychotic agents: N/A  -Melatonin PRN  -Reports last IRF admission mom encouraged him to stop his seizure medication. No issues this admission, will watch closely.   -7-10: Intermittent frustration but overall cooperative and participatory  5. Neuropsych/cognition: This patient is not fully capable of making decisions on his own behalf.  6. Skin/Wound Care: Routine pressure relief measure.    7. Fluids/Electrolytes/severe protein calorie malnutrition: monitor I/O. Routine labs. Cont supplements.   - 7-7 labs stable; placed on 100 mL/h's  continuous IV fluids for 24 hours due to nausea/somnolence--DC'd 7-8 7-10: Labs stable.  Will give IV fluids today 75 cc/h given ongoing nausea/retching, poor p.o. intakes. P.o. intakes limited by lack of interest in food, will consult nutrition for a.m. and consider appetite stimulant if antinausea medications ineffective.  See #18. 7/11: Nutrition consult completed, appreciate recommendations.  Notable patient has had 11% weight loss in the last 3 months.   7/14- still eating next to nothing.po intake recorded at 50cc yesterday   -BMET ok today, add on prealbumin   -begin trial of megace    -  staff and family will need to push po   -?PEG   7/15: Megace  400 mg BID started yesterday; ate 25% lunch, 15% dinner.  Briefly discussed possibility of tube feeds with wife, may be agreeable to temporary nasogastric feeds but almost definitely patient would refuse PEG.  Wish to treat is much as possible with medications and workup first before considering tube feeds.  7/16: Steroids today for appetite per Dr. Buckley; patient wishes to limit medications so will hold off on adding olanzepine  8. T2DM: Hgb A1c-5.4 and diet controlled.  -12/26/23 CBGs fine, not on SSI, no CBG checks ordered-- will tell nursing this is not needed.    9.  HTN: Monitor BP TID--avoid hypoperfusion. Continue Amlodipine  5 mg/day and propranolol  20 mg bid. Blood pressure well-controlled on current regimen; some highs intermittently but resolve    Vitals:   01/03/24 1320 01/03/24 1924 01/03/24 2106 01/04/24 0457  BP: 137/77 (!) 142/87 (!) 142/87 (!) 172/80   01/04/24 1543 01/04/24 2051 01/04/24 2125 01/05/24 0553  BP: (!) 143/77 (!) 150/88 (!) 150/88 130/80   01/05/24 1320 01/05/24 2036 01/05/24 2143 01/06/24 0541  BP: (!) 127/90 125/83 125/83 (!) 154/86    10. Dysphagia: NO straws to decrease aspiration events.  --GI consult recommended as retention of solids contribute to aspiration. Per Dr. Lizabeth note this was discussed with  GI and who felt patient to be too high risk for GI procedures and recommended follow up after d/c. -SLP following --no complaints or concerns for aspiration this admission 7-15: Noticed pocketing with therapies today, new behavior.  SLP informed and to evaluate this afternoon--pt refused diet downgrade  7/16: d/w SLP repeat swallow eval. GI consult prior reviewed; did not feel pt's dysfuctnion was amenable to scoping intervention at that time.    11. Seizure d/o; On Lamictal  150 mg am and 200 mg pm-->educate on compliance.  7/4- pt so far is taking meds-  7-7: Lamictal  increased to 200 mg twice daily per neurology due to concern for postictal state as above--no reports of recurrent seizures   7/15: Recurrent episodes of emesis/nausea without post-event lethargy as seen prior, not improved with seizure medication adjustment--discussed with wife, low clinical suspicion for seizure etiology  7/16: See recs; reduce Lamictal  to 150 mg BID   12. Oligodendroglioma c/b radiation necrosis: Follow up with Dr. 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    - 7/15: MRI brain today; Dr. Buckley assisting as above. Appreciate his insight and recommendations-- no obvious changes to explain current decline   13. Dyslipidemia: LDL-129 and HDL 27. On Lipitor 40 mg/day.   14. Frequency/Nocturia: Gets up at nights and pt/wife agreed to toileting at 11 pm and 3 am to prevent attempts out of bed.  7/4- pt refused these scheduled get up times, but fell- asked nursing to try and get him up for nocturia Continent of bladder  15. Spasticity - concerned about starting Any spasticity meds due to risk of sedation/confusion - suggest to be assessed for Botulinum toxin for LUE.  7-7: Minimal tone in left upper extremity, and functional strength in limb; perceived pout tone is mostly flexor posturing with left inattention, which he can break with volitional relaxation.  Feel recovery would be stymied by use of Botox at this time, continue  conservative management.    7/15: Ongoing flexor synergy which waxes/wanes with inattention; no appreciable spasticity on exam  16. L inattention/neglect-secondary to #1.  17. Bowels: no BM documented since 7/1 but pt states he's going daily, however history  is limited d/t aphasia. Adamant that he had a BM yesterday. Asked nursing to please document.  -01/02/24 no BM documented in 3 days, will go back to scheduled miralax  daily -01/03/24 still no BM documented but pt very adamant he had a BM yesterday; will leave meds as is for now, but would monitor closely  - 7/14 had bm 7/12. Eating nothing though, wouldn't expect much  18.  Nausea/dizziness -recurrent, central vertigo versus gastric irritation from poor p.o. intake/high pill burden  7-7: Compazine  reduce to 5 mg p.o./IV due to somnolence.-->  2.5 mg p.o. as needed, with benefit 7-8  7-10: Discussed case with Dr. Buckley as above; hold off on MRI for now, interventions as follows   Protonix  40 mg twice daily for GI protection   Scheduled Zofran  8 mg every 8 hours; will leave Compazine  as as needed   Decadron  1 mg daily for 3 days   Scopolamine  patch every 72 hours--will hold off on initiating until a.m., to monitor improvement with other measures   IV fluids normal saline 75 cc/h tonight as above   PT vestibular evaluation ordered--will hold off for today given severity of symptoms   7-11: Doing much better with measures as described above, is eating today.  No further dizziness.  Will DC IV fluids, if any issues over the weekend would start scopolamine  patch and consider MRI brain.  - 01/04/24 no complaints again this morning, monitor. See nutrition discussion above  - 7/15: emesis yesterday, nausea today, PO intakes remain poor. Possible high-dose steroids pending AM cortisol as above.   7/16: Start Decadron  2 mg daily after AM labs, start scopalamine patch, reduce lamictal  as above. Discussed with patient eating food prior to  medications.     LOS: 13 days A FACE TO FACE EVALUATION WAS PERFORMED  Alan Wise 01/06/2024, 8:02 AM

## 2024-01-06 NOTE — Telephone Encounter (Signed)
Patient Product/process development scientist completed.    The patient is insured through [facility]. Patient has [employer], may use a copay card, and/or apply for patient assistance if available.    Ran test claim for Eliquis 5 mg and the current 30 day co-pay is $0.00.   This test claim was processed through [facility]- copay amounts may vary at other pharmacies due to pharmacy/plan contracts, or as the patient moves through the different stages of their insurance plan.     Roland Earl, CPHT Pharmacy Technician III Certified Patient Advocate [facility] Patient Advocate Team Direct Number: [phone]  Fax: [phone]

## 2024-01-06 NOTE — Progress Notes (Signed)
 Occupational Therapy Session Note  Patient Details  Name: Alan Wise MRN: [id number] Date of Birth: September 20, 1970  Session 1: Today's Date: 01/06/2024 OT Individual Time: 1433-1500 OT Individual Time Calculation (min): 27 min    Short Term Goals: Week 2:  OT Short Term Goal 1 (Week 2): Continue progressing toward LTGs  Session 1 : Skilled Therapeutic Interventions/Progress Updates:  Patient agreeable to participate in OT session. Reports 5/10 pain level.   Patient participated in skilled OT session focusing on NM re education, and transfer training. Patient able to complete transfer to wc and bed x3 with min A. Min A to mod A for bed mobility to EOB. Patient able to complete NM re education utilizing WB through LUE to increase elbow extension while completing weight shifting standing at sink. Patient able to complete functional mobility with min A with RW within room       Therapy Documentation Precautions:  Precautions Precautions: Fall Recall of Precautions/Restrictions: Intact Precaution/Restrictions Comments: L visual field cut and L hemipareisis at baseline, expressive > receptive aphasia Restrictions Weight Bearing Restrictions Per Provider Order: No Other Position/Activity Restrictions: L visual field cut at baseline   Therapy/Group: Individual Therapy  [name] 01/06/2024, 7:48 AM

## 2024-01-06 NOTE — Progress Notes (Signed)
 Occupational Therapy Discharge Summary  Patient Details  Name: Alan Wise MRN: [id number] Date of Birth: 01/02/1971  Date of Discharge from OT service:January 10, 2024   Patient has met 6 of 9 long term goals due to improved activity tolerance, improved balance, postural control, ability to compensate for deficits, functional use of  LEFT upper and LEFT lower extremity, improved attention, improved awareness, and improved coordination.  Patient to discharge at overall CGA level.  Patient's care partner is independent to provide the necessary physical and cognitive assistance at discharge. Family education has been limited d/t pt refusing for his wife to be trained. Provided her with as much education as possible.   Reasons goals not met: Alan Wise has had fluctuating medical status that has impacted his progress toward his goals. He still requires min A with toileting tasks, LB dressing, and shower transfers.   Recommendation:  Patient will benefit from ongoing skilled OT services in home health setting to continue to advance functional skills in the area of BADL, iADL, and Reduce care partner burden.  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, expressive > receptive aphasia Restrictions Weight Bearing Restrictions Per Provider Order: No Other Position/Activity Restrictions: L visual field cut at baseline  ADL ADL Eating: Set up Where Assessed-Eating: Bed level Grooming: Supervision/safety Where Assessed-Grooming: Sitting at sink Upper Body Bathing: Supervision/safety Where Assessed-Upper Body Bathing: Shower Lower Body Bathing: Supervision/safety Where Assessed-Lower Body Bathing: Shower Upper  Body Dressing: Supervision/safety Where Assessed-Upper Body Dressing: Sitting at sink Lower Body Dressing: Minimal assistance Where Assessed-Lower Body Dressing: Edge of bed Toileting: Minimal assistance Where Assessed-Toileting: Teacher, adult education: Furniture conservator/restorer Method: Art gallery manager: Not assessed Tub/Shower Equipment: Information systems manager with back Film/video editor: Administrator, arts Method: Warden/ranger: Grab bars, Shower seat without back Vision Baseline Vision/History:  (baseline L field cut) Patient Visual Report: Diplopia;Blurring of vision Vision Assessment?: Yes Eye Alignment: Within Functional Limits Ocular Range of Motion: Within Functional Limits Alignment/Gaze Preference: Within Defined Limits Tracking/Visual Pursuits: Requires cues, head turns, or add eye shifts to track Saccades: Within functional limits Convergence: Impaired (comment) Visual Fields: Left visual field deficit;Left homonymous hemianopsia Additional Comments: L visual field deficits- difficult to isolate deficit d/t aphasia Perception  Perception: Impaired Perception-Other Comments: L inattention Praxis Praxis: Impaired Praxis Impairment Details: Motor planning Cognition Cognition Overall Cognitive Status: Difficult to assess Arousal/Alertness: Awake/alert Orientation Level: Person;Place;Situation Person: Oriented Place: Oriented Situation: Oriented Memory: Impaired Memory Impairment: Decreased short term memory Attention: Selective Selective Attention: Impaired Selective Attention Impairment: Verbal basic;Functional basic Awareness: Impaired Problem Solving: Impaired Problem Solving Impairment: Verbal basic;Functional basic Executive Function: Self Correcting;Self Monitoring Self Monitoring: Impaired Self Correcting: Impaired Behaviors: Poor frustration tolerance Safety/Judgment: Impaired Brief Interview  for Mental Status (BIMS) Repetition of Three Words (First Attempt): No answer (aphasia limiting) Temporal Orientation: Year: No answer Temporal Orientation: Month: No answer Temporal Orientation: Day: No answer Recall: Sock: No answer Recall: Blue: No answer Recall: Bed: No answer BIMS Summary Score: 99 Sensation Sensation Light Touch: Impaired by gross assessment Hot/Cold: Appears Intact Proprioception: Impaired by gross assessment Coordination Gross Motor Movements are Fluid and Coordinated: No Fine Motor Movements are Fluid and Coordinated: No Coordination and Movement Description: L hemi Motor  Motor Motor: Hemiplegia Mobility  Bed Mobility Bed Mobility: Rolling Right;Rolling Left;Sit to Supine;Supine to Sit Rolling Right: Supervision/verbal cueing Rolling Left: Supervision/Verbal cueing Supine to Sit: Supervision/Verbal cueing Sit to Supine: Supervision/Verbal cueing Transfers Sit to Stand: Supervision/Verbal cueing Stand to Sit: Supervision/Verbal cueing  Trunk/Postural Assessment  Cervical Assessment Cervical Assessment: Exceptions to [facility] (forward flexed head) Thoracic Assessment Thoracic Assessment: Exceptions to [facility] (rounded shoulders) Lumbar Assessment Lumbar Assessment: Exceptions to [facility] (posterior pelvic tilt) Postural Control Postural Control: Deficits on evaluation Righting Reactions: delayed Protective Responses: delayed  Balance Balance Balance Assessed: Yes Static Sitting Balance Static Sitting - Balance Support: Feet unsupported Static Sitting - Level of Assistance: 6: Modified independent (Device/Increase time) Dynamic Sitting Balance Dynamic Sitting - Balance Support: Feet supported Dynamic Sitting - Level of Assistance: 6: Modified independent (Device/Increase time) Static Standing Balance Static Standing - Balance Support: During functional activity Static Standing - Level of Assistance: 5: Stand by assistance Dynamic Standing  Balance Dynamic Standing - Balance Support: During functional activity Dynamic Standing - Level of Assistance: 5: Stand by assistance Extremity/Trunk Assessment RUE Assessment RUE Assessment: Within Functional Limits LUE Assessment LUE Assessment: Exceptions to [facility] Active Range of Motion (AROM) Comments: limited to 90* at shoulder, unable to fully extend elbow- lacking about 10 degrees. Full hand composite flexion, 50% extension LUE Body System: Neuro Brunstrum levels for arm and hand: Arm;Hand Brunstrum level for arm: Stage IV Movement is deviating from synergy Brunstrum level for hand: Stage V Independence from basic synergies LUE Tone LUE Tone: Modified Ashworth Body Part - Modified Ashworth Scale: Elbow Elbow - Modified Ashworth Scale for Grading Hypertonia LUE: More marked increase in muscle tone through most of the ROM, but affected part(s) easily moved Modified Ashworth Scale for Grading Hypertonia LUE: More marked increase in muscle tone through most of the ROM, but affected part(s) easily moved   [name] 01/06/2024, 3:32 PM

## 2024-01-06 NOTE — Progress Notes (Signed)
 Physical Therapy Session Note  Patient Details  Name: Alan Wise MRN: [id number] Date of Birth: 09/08/1970  Today's Date: 01/06/2024 PT Individual Time: 9195-9141 PT Individual Time Calculation (min): 54 min   Today's Date: 01/06/2024 PT Individual Time: 8996-8955 PT Individual Time Calculation (min): 41 min   Short Term Goals: Week 2:  PT Short Term Goal 1 (Week 2): STGs = LTGs  Skilled Therapeutic Interventions/Progress Updates:     Pt received semi reclined in bed taking morning meds. No indication of pain. Supine to sit with cues for positioning and increased time required. Pt performs sit to stand with Rt HHA minA and ambulates to sink to brush teeth. Following, PT assists to don shoes and pt stands and ambulates x300' with minA at trunk for stability. Pt initially able to clear Lt foot during ambulation but begins to drag during swing phase with fatigue. Seated rest break. Pt attempts Wii bowling game to challenge dynamic standing balance, endurance, and learning a novel task, but pt has difficulty with completing effectively so activity terminated.   Pt ambulates x175' without AD, with challenge of not catching Lt toes during swing phase during bout. Pt is able to complete with only mild dragging of Lt foot, and minA overall for balance. Pt then completes step ups on 3.5 inch step with LLE leading to challenge hemi extremity. Pt completes 2x5 with minA/modA depending on fatigue and ability to shift weight anteriorly over stance leg. Pt has several LOBs to the Lt and posteriorly during activity. Seated rest break in between bouts. Pt ambulates back to room with minA at trunk to promote stability and Rt lateral weight shifting. Left supine with all needs within reach.   2nd Session: pt received semi reclined in bed and agrees to therapy. No complaint of pain. Supine to sit with bed features. Pt performs stand step to [facility] with cues for sequencing and positioning. WC transport to gym  for time management. Pt performs stand step to Nustep with CGA and cues for sequencing. Pt completes Nustep for reciprocal coordination and endurance training. Pt completes x15:00 at workload of 5 with average steps per minute ~35. PT provides cues for hand and foot placement and utilizes Lt hand splint to facilitate grip on Lt side. Following extended seated rest break, pt ambulates x300' back to room with minA to prevent anterior LOB and to promote clearance of LLE during swing phase. 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

## 2024-01-06 NOTE — Progress Notes (Signed)
 Occupational Therapy Session Note  Patient Details  Name: Alan Wise MRN: [id number] Date of Birth: [birth year]  Today's Date: 01/06/2024 OT Individual Time: 8694-8584 OT Individual Time Calculation (min): 70 min    Short Term Goals: Week 2:  OT Short Term Goal 1 (Week 2): Continue progressing toward LTGs  Skilled Therapeutic Interventions/Progress Updates:    Pt received supine with no c/o pain, agreeable to OT session. He completed bed mobility to EOB with increased effort but (S) overall. He repeated that which is  multiple times, getting very frustrated by OT lack of understanding. Encouraged pt to explain differently and take OT to what he needs but he refused to attempt. He motioned to w/c and declined walking to the gym as he usually does. Stand pivot transfer with min A with posterior LOB. He was taken to the therapy gym via w/c. Attempted to initiate activity but pt keeping eyes closed and not engaging. Offered going outside for mood elevation and he stated yes. He was taken to courtyard outside to allow reflection and mood elevation to hopefully improve self efficacy/participation. He was taken back inside following. He engaged in LUE NMR on the BUE ergometer. He required mod [facility] facilitation to maintain grasp on the LUE. This provided a great extension stretch. Pt grimacing at end range but willing to continue. He completed 61 revolutions with several rest breaks required. He then completed functional reach with sustained grasp activity on the BITS, using a large drum stick to extend elbow to reach target on the screen. He had worse L flexion with effort and required max facilitation to reach the screen. He was able to maintain grasp without assist. He then was taken into the ADL apt where he stood with close (S) and completed functional grasp and reach activity- picking up cans, cannisters, and packages of pudding and placing them into cabinets at eye level. He was able to bring  LUE into 80 degrees of shoulder flexion before he reached 100 degrees with AAROM from OT. He was able to bring LUE into hand extension via weightbearing to prime his hand for appropriate extension and prehension to grasp items. He returned to his room after. Stand pivot back to bed with close (S). He was left supine with all needs met.   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

## 2024-01-07 ENCOUNTER — Inpatient Hospital Stay (HOSPITAL_COMMUNITY)

## 2024-01-07 DIAGNOSIS — R112 Nausea with vomiting, unspecified: Secondary | ICD-10-CM

## 2024-01-07 LAB — CBC
HCT: 50.4 % (ref 39.0–52.0)
Hemoglobin: 17 g/dL (ref 13.0–17.0)
MCH: 29.1 pg (ref 26.0–34.0)
MCHC: 33.7 g/dL (ref 30.0–36.0)
MCV: 86.2 fL (ref 80.0–100.0)
Platelets: 275 K/uL (ref 150–400)
RBC: 5.85 MIL/uL — ABNORMAL HIGH (ref 4.22–5.81)
RDW: 12.6 % (ref 11.5–15.5)
WBC: 10.6 K/uL — ABNORMAL HIGH (ref 4.0–10.5)
nRBC: 0 % (ref 0.0–0.2)

## 2024-01-07 LAB — BASIC METABOLIC PANEL WITH GFR
Anion gap: 11 (ref 5–15)
BUN: 16 mg/dL (ref 6–20)
CO2: 30 mmol/L (ref 22–32)
Calcium: 10.3 mg/dL (ref 8.9–10.3)
Chloride: 98 mmol/L (ref 98–111)
Creatinine, Ser: 1.17 mg/dL (ref 0.61–1.24)
GFR, Estimated: >60 mL/min (ref >60)
Glucose, Bld: 113 mg/dL — ABNORMAL HIGH (ref 70–99)
Potassium: 3.9 mmol/L (ref 3.5–5.1)
Sodium: 139 mmol/L (ref 135–145)

## 2024-01-07 LAB — GLUCOSE, CAPILLARY: Glucose-Capillary: 147 mg/dL — ABNORMAL HIGH (ref 70–99)

## 2024-01-07 MED ORDER — PROCHLORPERAZINE MALEATE 5 MG PO TABS: 5.0000 mg | ORAL_TABLET | Freq: Three times a day (TID) | ORAL | Status: DC | PRN

## 2024-01-07 MED ORDER — SODIUM CHLORIDE 0.9 % IV SOLN: INTRAVENOUS | Status: AC

## 2024-01-07 NOTE — Progress Notes (Signed)
 Physical Therapy Discharge Summary  Patient Details  Name: Alan Wise MRN: [id number] Date of Birth: [birth year]  Date of Discharge from PT service:January 10, 2024  Today's Date: 01/10/2024 PT Individual Time: 0840-0950 PT Individual Time Calculation (min): 70 min    Patient has met 6 of 7 long term goals due to improved activity tolerance, improved balance, improved postural control, increased strength, improved attention, improved awareness, and improved coordination.  Patient to discharge at an ambulatory level Supervision.  Patient's care partner is independent to provide the necessary physical and cognitive assistance at discharge.  Reasons goals not met: Pt progressed bed to chair transfers to supervision level rather than independence with assistive device as patient still requires supervision for safety with DME and balance.  Recommendation:  Patient will benefit from ongoing skilled PT services in home health setting to continue to advance safe functional mobility, address ongoing impairments in balance, transfers, coordination, ambulation, endurance, and minimize fall risk.  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 Rarely or not at all Pain Interference with Therapy Activities: 1. Rarely or not at all Pain Interference with Day-to-Day Activities: 1. Rarely or not at all Vision/Perception  Vision -  History Ability to See in Adequate Light: 0 Adequate Vision - Assessment Eye Alignment: Within Functional Limits Perception Perception: Impaired Preception Impairment Details: Inattention/Neglect Perception-Other Comments: Lt sided Praxis Praxis: Impaired Praxis Impairment Details: Motor planning  Cognition Overall Cognitive Status: Difficult to assess Arousal/Alertness: Awake/alert Attention: Selective Selective Attention: Impaired Selective Attention Impairment: Verbal basic;Functional basic Memory: Impaired Memory Impairment: Decreased short term memory Awareness: Impaired Problem Solving: Impaired Problem Solving Impairment: Verbal basic;Functional basic Executive Function: Self Correcting;Self Monitoring Self Monitoring: Impaired Self Correcting: Impaired Behaviors: Poor frustration tolerance Safety/Judgment: Impaired Sensation Sensation Light Touch: Impaired by gross assessment Hot/Cold: Appears Intact Proprioception: Impaired by gross assessment Coordination Gross Motor Movements are Fluid and Coordinated: No Fine Motor Movements are Fluid and Coordinated: No Coordination and Movement Description: L hemi Motor  Motor Motor: Hemiplegia  Mobility Bed Mobility Bed Mobility: Rolling Right;Rolling Left;Sit to Supine;Supine to Sit Rolling Right: Independent Rolling Left: Independent Supine to Sit: Independent Sit to Supine: Independent Transfers Transfers: Sit to Stand;Stand to Sit;Stand Pivot Transfers Sit to Stand: Independent with assistive device Stand to Sit: Independent with assistive device Stand Pivot Transfers: Supervision/Verbal cueing Transfer (Assistive device): Rolling walker Locomotion : Locomotion  Pick up small object from the floor assist level: Supervision/Verbal cueing Pick up small object from the floor assistive device: RW  Trunk/Postural Assessment  Cervical Assessment Cervical Assessment: Exceptions to [facility] (forward flexed  head) Thoracic Assessment Thoracic Assessment: Exceptions to [facility] (rounded shoulders) Lumbar Assessment Lumbar Assessment: Exceptions to [facility] (posterior pelvic tilt) Postural Control Postural Control: Deficits on evaluation Righting Reactions: delayed Protective Responses: delayed  Balance Balance Balance Assessed: Yes Static Sitting Balance Static Sitting - Balance Support: Feet unsupported Static Sitting - Level of Assistance: 6: Modified independent (Device/Increase time) Dynamic Sitting Balance Dynamic Sitting - Balance Support: Feet supported Dynamic Sitting - Level of Assistance: 6: Modified independent (Device/Increase time) Static Standing Balance  Static Standing - Balance Support: During functional activity Static Standing - Level of Assistance: 5: Stand by assistance Dynamic Standing Balance Dynamic Standing - Balance Support: During functional activity Dynamic Standing - Level of Assistance: 5: Stand by assistance Extremity Assessment  RLE Assessment RLE Assessment: Within Functional Limits LLE Assessment LLE Assessment: Exceptions to [facility] General Strength Comments: gross 3+/5  Session Note: Chart reviewed and pt agreeable to therapy. Pt received semi-reclined in bed with no c/o pain. Session focused on discharge and amb endurance to promote home and community mobility. Pt initiated session with discharge evaluation as documented above. Pt then completed amb of >107ft without rest break around therapy floor, down to lobby area, and outside across uneven surfaces with S + RW. Pt also completed balance exercises of neutral stance, narrow stance, and eyes closed all with S and periodic CGA. Session education emphasized care transition and safety with DME. Pt noted to still be at supervision level for amb, so pt not designated as independent in room at this time. At end of session, pt was left seated in [facility] with alarm engaged, nurse call bell and all needs in reach.      Warrick KANDICE Raspberry 01/10/2024, 9:51 AM

## 2024-01-07 NOTE — Progress Notes (Signed)
 Patient ID: Alan Wise, male   DOB: [birth year], 53 y.o.   MRN: [id number]  SW received updates from PT pt will need transport chair and wife will purchase RW.  *SW received updates from attending that discharge date has now changed to Monday due to medical concerns.   1320-SW spoke with pt wife about above. She would like SW to order both items.   SW ordered transport chair and RW with [employer] via parachute.  Graeme Jude, MSW, LCSW Office: [phone] Cell: [phone] Fax: [phone]

## 2024-01-07 NOTE — Progress Notes (Signed)
 Physical Therapy Session Note  Patient Details  Name: Alan Wise MRN: [id number] Date of Birth: [birth year]  Today's Date: 01/07/2024 PT Individual Time: 0802-0900 PT Individual Time Calculation (min): 58 min  and Today's Date: 01/07/2024 PT Missed Time: 45 Minutes Missed Time Reason: Patient unwilling to participate  Short Term Goals: Week 2:  PT Short Term Goal 1 (Week 2): STGs = LTGs  Skilled Therapeutic Interventions/Progress Updates:     1st Session: Pt received supine in bed and agrees to therapy. No complaint of pain. Supine to sit with bed features and cues for positioning. Pt performs stand step transfer to [facility] with cues for sequencing and positioning. WC transport to gym for time management. PT educates on recommendation to use RW and pt is agreeable to try, though not very happy about idea, it does not seem. Pt performs sit to stand with cues for hand placement and body mechanics. Pt ambulates x175' with RW and cues to increase proximity to RW for safety, and increase Lt step height to prevent foot drag. Following rest break, pt ambulates x300' with similar cues, and not requiring any physical assistance. Pt completes ramp navigation and car transfer with RW and cues for sequencing. WC transport back to room. Left seated with all needs within each.   2nd Session: Pt refuses therapy at this time. Appears upset about DC being extended. PT will follow up as able.   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

## 2024-01-07 NOTE — Progress Notes (Addendum)
 Speech Language Pathology Daily Session Note  Patient Details  Name: Alan Wise MRN: [id number] Date of Birth: February 01, 1971  Today's Date: 01/07/2024 SLP Individual Time: 1330-1425 SLP Individual Time Calculation (min): 55 min  Short Term Goals: Week 2: SLP Short Term Goal 1 (Week 2): STGs = LTGs d/t ELOS  Skilled Therapeutic Interventions:   Pt and wife greeted at bedside for tx tasks targeting language. He was asleep upon SLP arrival, but woke after a few mins of conversation between SLP and wife. During initial conversation, SLP provided wife w/ brief update re yesterday's performance in ST. Also provided encouragement d/t wife's appropriate frustration re ongoing n//v and pt's regression from performance in OP therapies. Positively, she did report adequate oral clearance and swallowing w/ breakfast this AM. Once awake, pt was agreeable to functional language tasks. He was able to answer Y/N questions w/ supervisionA during simple conversation re wants/needs and comfortability. SLP facilitated automatic unscrambling task to prime for verbalization automatics. Pt quickly became frustrated w/ attempted cueing from SLP, repeating no no no and grunting at SLP. SLP provided reduced cueing d/t this. He required a notable amount of additional time to complete, however; unscrambled 1-20 independently and the dow/months w/ minA. Also provided pt w/ additional time and attempts at verbalizations given frustration. He benefited from modA to verbalize 1-20 and maxA to verbalize the months. He remains more aware of verbal errors, however, limited to no awareness of visual/reading deficits. Additionally, pt personality and frustration negatively impact education and carryover. GI PA arrived as pt was completing automatics task. Tx tasks discontinued so pt and his wife could meet w/ PA. He was left in bed with the alarm set and call light within reach upon SLP departure. Recommend cont ST per POC.   Pain   No pain reported - appeared comfortable  Therapy/Group: Individual Therapy  [name] 01/07/2024, 2:02 PM

## 2024-01-07 NOTE — Progress Notes (Signed)
 Occupational Therapy Session Note  Patient Details  Name: JAIDEV SANGER MRN: [id number] Date of Birth: 1970/10/15  Today's Date: 01/07/2024 OT Individual Time: 0950-1030 OT Individual Time Calculation (min): 40 min    Short Term Goals: Week 2:  OT Short Term Goal 1 (Week 2): Continue progressing toward LTGs  Skilled Therapeutic Interventions/Progress Updates:  Pt greeted sitting in [facility], spouse present, skilled OT session with focus on LUE ROM/HEP. Pt with no reports/indications of pain, communicating want to be transported in [facility] with gestures. Pt and OT reviewed HEP for LUE ROM, heat applied to limb intermediately to assist with tone/spasticity. Back in patient, spouse provided with handout of exercises reviewed, receptive to education in regards to benefits of heat application. Pt remained sitting in WC with 4Ps assessed and immediate needs met. Pt continues to be appropriate for skilled OT intervention to promote further functional independence in ADLs/IADLs.   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

## 2024-01-07 NOTE — Consult Note (Signed)
 Initial Consultation Note   Patient: Alan Wise DOB: [birth year] PCP: Antonette Angeline ORN, NP DOA: 12/24/2023 DOS: the patient was seen and examined on 01/07/2024 Primary service: Emeline Joesph BROCKS, DO  Referring physician: Joesph Emeline Reason for consult: Possible AI  Assessment/Plan: Assessment and Plan: 89M h/o T2DM, hyperlipidemia, GERD, low grade glioma and recent admission for new onset L weakness and found to have new basal ganglia infarct c/b expressive aphasia for which he is admitted to IP rehab. Medicine is consulted given pt's ongoing FTT, and low AM cortisol c/f AI.  FTT Low AM cortisol Possible AI Low suspicion for AI given lack of unexplained sx, including volume depletion, hypotension, hyponatremia, hyperkalemia, fever, abdominal pain, hyperpigmentation, or hypoglycemia. Pt had hypokalemia not hyperkalemia, nl sodium, nl BP on anti-hypertensive medication (no hypotension), as well as nl glucose (no hypoglycemia). Majority of patient's sx can be best explained by limited PO intake c/w FTT following new CVA. -If AI remains high on Ddx given its emergent nature, consider treating with stable oral dosing including fludrocortisone 50mcg daily and hydrocortisone 5mg  TID and monitoring pt for improvement with plan for ACTH stimulation testing later; alternatively, steroids can be d/c altogether and allow washout of 4 half lives prior to ACTH testing (decadron  half life is 36-54h so a 8-10 day washout period would be required) -Reconsider, NGT followed by PEG to allow adequate PO intake and determine if pt clinically improves -Consider palliative care consult given complex pt disposition pending rehab progress and ongoing GOC discussions  TRH will sign off at present, please call us  again when needed.  HPI: Alan Wise is a 53 y.o. male with past medical history of T2DM, hyperlipidemia, GERD, low grade glioma and recent admission for new onset L weakness and  found to have new basal ganglia infarct c/b expressive aphasia for which he is admitted to IP rehab. Medicine is consulted given pt's ongoing FTT, and low AM cortisol c/f AI.    Review of Systems: As mentioned in the history of present illness. All other systems reviewed and are negative. Past Medical History:  Diagnosis Date   Allergy    Complication of anesthesia    pt reports he is starting to have more difficulty arousing after surgery   Diabetes mellitus (HCC)    GERD (gastroesophageal reflux disease)    Headache    History of kidney stones    Hyperlipidemia    Renal disorder    kidney stones   Past Surgical History:  Procedure Laterality Date   APPLICATION OF CRANIAL NAVIGATION Right 01/17/2021   Procedure: APPLICATION OF CRANIAL NAVIGATION;  Surgeon: Debby Dorn MATSU, MD;  Location: [facility] OR;  Service: Neurosurgery;  Laterality: Right;   COLONOSCOPY  09/03/1994   COLONOSCOPY WITH PROPOFOL  N/A 01/07/2023   Procedure: COLONOSCOPY WITH PROPOFOL ;  Surgeon: Jinny Carmine, MD;  Location: [facility] ENDOSCOPY;  Service: Endoscopy;  Laterality: N/A;  NOT TOO EARLY   CYSTOSCOPY WITH STENT PLACEMENT Right 01/25/2016   Procedure: CYSTOSCOPY WITH STENT PLACEMENT;  Surgeon: Redell Lynwood Napoleon, MD;  Location: [facility] ORS;  Service: Urology;  Laterality: Right;   FRAMELESS  BIOPSY WITH BRAINLAB Right 01/17/2021   Procedure: RIGHT STEREOTACTIC BIOPSY OF INSULAR LESION;  Surgeon: Debby Dorn MATSU, MD;  Location: [facility] OR;  Service: Neurosurgery;  Laterality: Right;   KNEE ARTHROSCOPY W/ MENISCAL REPAIR Right 06/23/1988   POLYPECTOMY  01/07/2023   Procedure: POLYPECTOMY;  Surgeon: Jinny Carmine, MD;  Location: [facility] ENDOSCOPY;  Service: Endoscopy;;   removal of  birthmark  06/08/2013   SKIN CANCER EXCISION  06/23/2012   TYMPANOSTOMY TUBE PLACEMENT  08/06/1978   URETEROSCOPY WITH HOLMIUM LASER LITHOTRIPSY Right 01/25/2016   Procedure: URETEROSCOPY WITH HOLMIUM LASER LITHOTRIPSY;  Surgeon: Redell Lynwood Napoleon,  MD;  Location: [facility] ORS;  Service: Urology;  Laterality: Right;   URETHRAL STRICTURE DILATATION     visual inspection of vocal cord     1973   WISDOM TOOTH EXTRACTION     Social History:  reports that he has never smoked. He has never used smokeless tobacco. He reports that he does not currently use alcohol. He reports that he does not use drugs.  Allergies  Allergen Reactions   Contrast Media [Iodinated Contrast Media] Swelling   Keppra  [Levetiracetam ]     Agitated and mean    Family History  Problem Relation Age of Onset   Hyperlipidemia Father    Hypertension Father    Diabetes Father    Benign prostatic hyperplasia Father    Stroke Maternal Grandmother    Diabetes Maternal Grandmother    Stroke Paternal Grandmother    Hypertension Paternal Grandmother    Kidney disease Neg Hx    Prostate cancer Neg Hx     Prior to Admission medications   Medication Sig Start Date End Date Taking? Authorizing Provider  amLODipine  (NORVASC ) 5 MG tablet TAKE 1 TABLET (5 MG TOTAL) BY MOUTH DAILY. 11/05/23   Vaslow, Zachary K, MD  apixaban  (ELIQUIS ) 5 MG TABS tablet Take 1 tablet (5 mg total) by mouth 2 (two) times daily. Patient not taking: Reported on 12/18/2023 12/11/23   Vaslow, Zachary K, MD  ARIPiprazole  (ABILIFY ) 5 MG tablet Take 1 tablet (5 mg total) by mouth daily. Patient not taking: Reported on 12/18/2023 12/11/23   Vaslow, Zachary K, MD  aspirin  EC 81 MG tablet Take 81 mg by mouth daily. Swallow whole.    [provider]  lamoTRIgine  (LAMICTAL ) 150 MG tablet Take 150 mg by mouth 2 (two) times daily. 11/16/23   [provider]  propranolol  (INDERAL ) 20 MG tablet TAKE 1 TABLET BY MOUTH TWICE A DAY 11/17/23   Buckley Arthea POUR, MD    Physical Exam: Vitals:   01/06/24 1931 01/07/24 0448 01/07/24 1128 01/07/24 1302  BP: 134/74 132/85 131/82 133/81  Pulse: 62 (!) 58 (!) 58 (!) 58  Resp: 17 16  16   Temp: (!) 97.5 F (36.4 C) 98.9 F (37.2 C)  97.6 F (36.4 C)   TempSrc: Oral Oral  Oral  SpO2: 97% 99% 98% 100%  Weight:      Height:       General: Alert, resting comfortably in no acute distress Respiratory: Lungs clear to auscultation bilaterally with normal respiratory effort; no w/r/r Cardiovascular: Regular rate and rhythm w/o m/r/g  Data Reviewed:   Lab Results  Component Value Date   WBC 10.6 (H) 01/07/2024   HGB 17.0 01/07/2024   HCT 50.4 01/07/2024   MCV 86.2 01/07/2024   PLT 275 01/07/2024   Lab Results  Component Value Date   GLUCOSE 113 (H) 01/07/2024   CALCIUM  10.3 01/07/2024   NA 139 01/07/2024   K 3.9 01/07/2024   CO2 30 01/07/2024   CL 98 01/07/2024   BUN 16 01/07/2024   CREATININE 1.17 01/07/2024   Lab Results  Component Value Date   ALT 37 01/06/2024   AST 21 01/06/2024   ALKPHOS 80 01/06/2024   BILITOT 0.7 01/06/2024   Lab Results  Component Value Date   INR  1.0 12/18/2023   INR 1.2 12/09/2023   INR 1.0 05/13/2023    Radiology: DG Abd 1 View Result Date: 01/07/2024 CLINICAL DATA:  Vomiting EXAM: ABDOMEN - 1 VIEW COMPARISON:  None Available. FINDINGS: The bowel gas pattern is normal. Diffuse increased lucency throughout the abdomen likely due to increased intraperitoneal fat. No radio-opaque calculi or other significant radiographic abnormality are seen. Lower chest is unremarkable. IMPRESSION: Bowel gas pattern is unremarkable. Increased lucency throughout the abdomen likely due to increased intraperitoneal fat. CT can be utilized for further assessment if clinically warranted. Electronically Signed   By: Megan  Zare M.D.   On: 01/07/2024 12:34      Family Communication: Wife Primary team communication: Spoke directly with Dr. Emeline   Thank you very much for involving us  in the care of your patient.  Author: Marsha Ada, MD 01/07/2024 4:51 PM  For on call review [email or web address].

## 2024-01-07 NOTE — Progress Notes (Signed)
 PROGRESS NOTE   Subjective/Complaints:  No events overnight. No episodes of emesis recorded yesterday. Tolerating Dys 3 diet, eating 0-25% meals.  On a.m. evaluation, developed sudden nausea/vomiting without provocation or change in position.  Endorses some abdominal pain with this.  Wife does not feel comfortable bringing patient home with current symptoms.   AM cortisol returned 1.6; extremely low. Was not taken as ordered timed at 8 am but still very low.  Consulted hospitalist regarding need for further workup, they will assess today.  Today, WBC appropriately elevated after starting p.o. steroids. Cr uptrending 1.17, BUN stable. Hgb concentrated.    ROS: limited d/t aphasia  + Abdominal pain  Objective:   MR BRAIN WO CONTRAST Result Date: 01/05/2024 CLINICAL DATA:  Neuro deficit, acute, stroke suspected hx CVA and radiation necrosis; worsening nausea and L inattention/weakness EXAM: MRI HEAD WITHOUT CONTRAST TECHNIQUE: Multiplanar, multiecho pulse sequences of the brain and surrounding structures were obtained without intravenous contrast. COMPARISON:  CT of the head dated December 28, 2023 and MRI of the head dated December 23, 2023. FINDINGS: Brain: There has been interval evolution of nonhemorrhagic infarcts involving the right cingulate gyrus and the right corona radiata and basal ganglia, as well as the left lentiform nucleus. There is residual increased diffusion signal within the previously treated right frontal lobe mass, which continues to demonstrate hemosiderin staining and surrounding T2 signal hyperintensity. There are no new areas of restricted diffusion present. There is generalized cerebral volume loss and there are confluent zones of increased T2 signal in the periventricular white matter. There is will air in degeneration of the right cerebral peduncle. Vascular: Normal flow voids. Skull and upper cervical spine: Normal marrow  signal. No osseous lesions. Sinuses/Orbits: Mild mucosal disease within the ethmoid air cells and left maxillary sinus. Normal orbits. Other: None. IMPRESSION: 1. Interval evolution of the nonhemorrhagic infarcts involving the right single lid gyrus, right basal ganglia and periventricular white matter and left lentiform nucleus. 2. Stable appearance of right frontal lobe previously treated neoplasm, with hemosiderin staining from prior hemorrhage and surrounding gliosis. No adverse interval change. Electronically Signed   By: [name] M.D.   On: 01/05/2024 15:46     Recent Labs    01/07/24 0443  WBC 10.6*  HGB 17.0  HCT 50.4  PLT 275    Recent Labs    01/06/24 0434 01/07/24 0443  NA 139 139  K 3.4* 3.9  CL 99 98  CO2 29 30  GLUCOSE 134* 113*  BUN 15 16  CREATININE 1.08 1.17  CALCIUM  9.9 10.3     Intake/Output Summary (Last 24 hours) at 01/07/2024 0945 Last data filed at 01/07/2024 0758 Gross per 24 hour  Intake 460 ml  Output --  Net 460 ml         Physical Exam: Vital Signs Blood pressure 132/85, pulse (!) 58, temperature 98.9 F (37.2 C), temperature source Oral, resp. rate 16, height 5' 8.5 (1.74 m), weight 59.6 kg, SpO2 99%.  Constitutional: No distress . Vital signs reviewed.  Sitting up at bedside.  Underweight. HEENT: NCAT, EOMI, oral membranes moist. Neck: supple; anterior tilt to head. Cardiovascular: RRR without murmur. No JVD  Respiratory/Chest: CTA Bilaterally without wheezes or rales. Normal effort    GI/Abdomen: BS +, no grimacing or obvious tenderness to palpation, non-distended. active dry heaving on exam, continues throughout interview and exam. Ext: no clubbing, cyanosis, or edema Psych: Unable to assess, intermittently agitated.  Neuro:  Awake, alert.   Global aphasia, expressive greater than receptive.  Answers yes/no questions intermittently appropriately, perseverative speech. --unchanged L hemineglect --can attend to left side  with verbal stimulation--unchanged  Strength: Moving all 4 extremities; unable to assess due to ongoing nausea/vomiting Cranial nerves: Left facial droop, left tongue weakness, left shoulder droop persistent Tone: No appreciable tone in left upper extremity; flexor synergy pattern--when able to relax, tone subsides.  Prior exams: RUE-5 out of 5 throughout LUE-3/5 shoulder abduction, biceps 3/5; triceps 3/5; WE 1/5; grip 4/5; FA 1-2/5-  RLE- 5-/5 throughout LLE- 3-4 proximally, 4/5 distally--- difficulty with relaxation and consistent activation  Decreased sensation to light touch throughout the left hemibody   Assessment/Plan: 1. Functional deficits which require 3+ hours per day of interdisciplinary therapy in a comprehensive inpatient rehab setting. Physiatrist is providing close team supervision and 24 hour management of active medical problems listed below. Physiatrist and rehab team continue to assess barriers to discharge/monitor patient progress toward functional and medical goals  Care Tool:  Bathing    Body parts bathed by patient: Right arm, Left arm, Chest, Abdomen, Front perineal area, Right upper leg, Left upper leg, Right lower leg, Left lower leg, Face, Buttocks   Body parts bathed by helper: Buttocks, Right arm     Bathing assist Assist Level: Supervision/Verbal cueing     Upper Body Dressing/Undressing Upper body dressing   What is the patient wearing?: Pull over shirt    Upper body assist Assist Level: Supervision/Verbal cueing    Lower Body Dressing/Undressing Lower body dressing      What is the patient wearing?: Underwear/pull up, Pants     Lower body assist Assist for lower body dressing: Minimal Assistance - Patient > 75%     Toileting Toileting    Toileting assist Assist for toileting: Minimal Assistance - Patient > 75%     Transfers Chair/bed transfer  Transfers assist     Chair/bed transfer assist level: Contact Guard/Touching  assist     Locomotion Ambulation   Ambulation assist      Assist level: Minimal Assistance - Patient > 75% Assistive device: No Device Max distance: 300'   Walk 10 feet activity   Assist     Assist level: Minimal Assistance - Patient > 75% Assistive device: No Device   Walk 50 feet activity   Assist    Assist level: Minimal Assistance - Patient > 75% Assistive device: No Device    Walk 150 feet activity   Assist    Assist level: Minimal Assistance - Patient > 75% Assistive device: No Device    Walk 10 feet on uneven surface  activity   Assist Walk 10 feet on uneven surfaces activity did not occur: Safety/medical concerns         Wheelchair     Assist Is the patient using a wheelchair?: Yes      Wheelchair assist level: Dependent - Patient 0% Max wheelchair distance: 200 ft    Wheelchair 50 feet with 2 turns activity    Assist        Assist Level: Dependent - Patient 0%   Wheelchair 150 feet activity     Assist      Assist Level:  Dependent - Patient 0%   Blood pressure 132/85, pulse (!) 58, temperature 98.9 F (37.2 C), temperature source Oral, resp. rate 16, height 5' 8.5 (1.74 m), weight 59.6 kg, SpO2 99%.  Medical Problem List and Plan: 1. 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  -7/4 fell last night- head CT looks the same  -7/8: SPV-CGA mobility this AM with OT; Min A for LUE integration. PT improving. SLP appears similar to last admission - per SLP was making progress in OP prior to this - very limited frustration tolerance.   -7/7: Episode of extreme lethargy with left upper extremity movements concerning for seizure after receiving 10 mg of IV Compazine .  EEG with cortical dysfunction from the right  frontotemporal region, postictal state, no epileptiform discharges--was in place during another similar event.  CT head unchanged.  Neurology consulted/curbside, likely combination of postictal and medication sensitivity, increasing Lamictal  and reduce Compazine  to 5 mg dosing.  Patient monitored throughout the afternoon, improved. 7-9: Will complete family training remotely due to difficulty coordinating family  7-10:Discussed case with Dr. Buckley; agree that patient is a challenging case with complex intracranial pathology, agree with MRI if nausea/dizziness does not improve in 2 to 3 days or if acute change in neurologic status.  Agree unlikely etiology of seizures.  Will treat with adjustment of antiemetics and start Decadron  1 mg for 3 days; keep low dose.   7/14--dc planned for tomorrow. Given his poor po intake and recent issues with nausea his progress has been slow. Spoke with his wife at length. I'm willing to keep him a bit longer to work on po intake and to improve his balance and safety before sending him home. See nutrition discussion below    7/15: Discussing with Dr. Buckley, ordering MRI brain without contrast given worsening left-sided weakness/inattention and ongoing recurrent emesis.  A.m. cortisol, TSH, T4, CMP ordered.  Considering bolus of high-dose steroids followed by 2 mg daily Decadron  for appetite stimulation--will wait until a.m. cortisol test to not confound results.  Dr. Buckley visiting with patient/family today.    7-17: Tentatively extending length of stay through Monday for ongoing nausea, GI workup.  Extensive discussion of temporary feeding tube with family, they continue to wish to defer at this time.  Consults to hospitalist, gastroenterology as below.   2.  Antithrombotics: -DVT/anticoagulation:  Pharmaceutical: Eliquis  5mg  BID- cannot stop             -antiplatelet therapy: N/A  3. Pain Management: Tylenol  PRN.   7-10: No complaints of pain, headaches with  nausea or emesis--remains consistent  7-17: Some complaints of abdominal pain today; not reproducible on exam, KUB negative.  Add heat pack as needed for belly pain.  4. Mood/Behavior/Sleep: LCSW to follow for evaluation and support.              -antipsychotic agents: N/A  -Melatonin PRN  -Reports last IRF admission mom encouraged him to stop his seizure medication. No issues this admission, will watch closely.   -7-10: Intermittent frustration but overall cooperative and participatory  5. Neuropsych/cognition: This patient is not fully capable of making decisions on his own behalf.  - Neuropsych attempted evaluation, unable to appreciably complete an assessment due to global aphasia and poor  frustration tolerance  6. Skin/Wound Care: Routine pressure relief measure.    7. Fluids/Electrolytes/severe protein calorie malnutrition: monitor I/O. Routine labs. Cont supplements.   - 7-7 labs stable; placed on 100 mL/h's continuous IV fluids for 24 hours due to nausea/somnolence--DC'd 7-8 7-10: Labs stable.  Will give IV fluids today 75 cc/h given ongoing nausea/retching, poor p.o. intakes. P.o. intakes limited by lack of interest in food, will consult nutrition for a.m. and consider appetite stimulant if antinausea medications ineffective.  See #18. 7/11: Nutrition consult completed, appreciate recommendations.  Notable patient has had 11% weight loss in the last 3 months.   7/14- still eating next to nothing.po intake recorded at 50cc yesterday   -BMET ok today, add on prealbumin   -begin trial of megace    -staff and family will need to push po   -?PEG   7/15: Megace  400 mg BID started yesterday; ate 25% lunch, 15% dinner.  Briefly discussed possibility of tube feeds with wife, may be agreeable to temporary nasogastric feeds but almost definitely patient would refuse PEG.  Wish to treat is much as possible with medications and workup first before considering tube feeds.  7/16: Steroids today  for appetite per Dr. Buckley; patient wishes to limit medications so will hold off on adding olanzepine  7-17: Per wife, has been trying to eat more recently.  Wished to hold off on feeding tube at this time.   8. T2DM: Hgb A1c-5.4 and diet controlled.  -12/26/23 CBGs fine, not on SSI, no CBG checks ordered-- will tell nursing this is not needed.    9.  HTN: Monitor BP TID--avoid hypoperfusion. Continue Amlodipine  5 mg/day and propranolol  20 mg bid. Blood pressure well-controlled on current regimen; some highs intermittently but resolve    Vitals:   01/04/24 0457 01/04/24 1543 01/04/24 2051 01/04/24 2125  BP: (!) 172/80 (!) 143/77 (!) 150/88 (!) 150/88   01/05/24 0553 01/05/24 1320 01/05/24 2036 01/05/24 2143  BP: 130/80 (!) 127/90 125/83 125/83   01/06/24 0541 01/06/24 1425 01/06/24 1931 01/07/24 0448  BP: (!) 154/86 136/88 134/74 132/85    10. Dysphagia: NO straws to decrease aspiration events.  --GI consult recommended as retention of solids contribute to aspiration. Per Dr. Lizabeth note this was discussed with GI and who felt patient to be too high risk for GI procedures and recommended follow up after d/c. -SLP following --no complaints or concerns for aspiration this admission 7-15: Noticed pocketing with therapies today, new behavior.  SLP informed and to evaluate this afternoon--pt refused diet downgrade  7/16: d/w SLP repeat swallow eval. GI consult prior reviewed; did not feel pt's dysfuctnion was amenable to scoping intervention at that time.   7-17: Dysphagia 3 diet per SLP; GI assessment as above   11. Seizure d/o; On Lamictal  150 mg am and 200 mg pm-->educate on compliance.  7/4- pt so far is taking meds-  7-7: Lamictal  increased to 200 mg twice daily per neurology due to concern for postictal state as above--no reports of recurrent seizures   7/15: Recurrent episodes of emesis/nausea without post-event lethargy as seen prior, not improved with seizure medication  adjustment--discussed with wife, low clinical suspicion for seizure etiology  7/16: See recs; reduce Lamictal  to 150 mg BID   12. Oligodendroglioma c/b radiation necrosis: Follow up with Dr. 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    - 7/15: MRI brain today; Dr. Buckley assisting as above. Appreciate his insight and recommendations-- no obvious changes to explain current decline   13. Dyslipidemia:  LDL-129 and HDL 27. On Lipitor 40 mg/day.   14. Frequency/Nocturia: Gets up at nights and pt/wife agreed to toileting at 11 pm and 3 am to prevent attempts out of bed.  7/4- pt refused these scheduled get up times, but fell- asked nursing to try and get him up for nocturia Continent of bladder  15. Spasticity - concerned about starting Any spasticity meds due to risk of sedation/confusion - suggest to be assessed for Botulinum toxin for LUE.  7-7: Minimal tone in left upper extremity, and functional strength in limb; perceived pout tone is mostly flexor posturing with left inattention, which he can break with volitional relaxation.  Feel recovery would be stymied by use of Botox at this time, continue conservative management.    7/15: Ongoing flexor synergy which waxes/wanes with inattention; no appreciable spasticity on exam  16. L inattention/neglect-secondary to #1.  17. Bowels: no BM documented since 7/1 but pt states he's going daily, however history is limited d/t aphasia. Adamant that he had a BM yesterday. Asked nursing to please document.  -01/02/24 no BM documented in 3 days, will go back to scheduled miralax  daily -01/03/24 still no BM documented but pt very adamant he had a BM yesterday; will leave meds as is for now, but would monitor closely  - 7/14 had bm 7/12. Eating nothing though, wouldn't expect much  No BM since 7/12; add daily miralax .   18.  Nausea -recurrent, central vertigo versus gastric irritation from poor p.o. intake/high pill burden  7-7: Compazine  reduce to 5 mg p.o./IV due to  somnolence.-->  2.5 mg p.o. as needed, with benefit 7-8  7-10: Discussed case with Dr. Buckley as above; hold off on MRI for now, interventions as follows   Protonix  40 mg twice daily for GI protection   Scheduled Zofran  8 mg every 8 hours; will leave Compazine  as as needed   Decadron  1 mg daily for 3 days   Scopolamine  patch every 72 hours--will hold off on initiating until a.m., to monitor improvement with other measures   IV fluids normal saline 75 cc/h tonight as above   PT vestibular evaluation ordered--will hold off for today given severity of symptoms   7-11: Doing much better with measures as described above, is eating today.  No further dizziness.  Will DC IV fluids, if any issues over the weekend would start scopolamine  patch and consider MRI brain.  - 01/04/24 no complaints again this morning, monitor. See nutrition discussion above  - 7/15: emesis yesterday, nausea today, PO intakes remain poor. Possible high-dose steroids pending AM cortisol as above.   7/16: Start Decadron  2 mg daily after AM labs, start scopalamine patch, reduce lamictal  as above. Discussed with patient eating food prior to medications.   7/17: AM cortisol low; per hospitalist inaccurate due to incorrect timing of draw.  Would need 4-day washout of current steroids prior to attempting corticotropin stimulating test.  Other labs without obvious causal factors.  Hospitalist, GI assessing today for further recommendations. - DC scopolamine , meclizine  due to no appreciable benefit - Increase Compazine  back to 5 mg as needed - Consider adjunctive addition of Thorazine for appetite stimulation/nausea - KUB negative; discussing with GI possible CT abdomen and pelvis tonight  18.  AKI.  Creatinine 1.17 from 0.9.  likely due to poor p.o. intakes.  Give 1 L IV fluid tonight, repeat testing in AM.  LOS: 14 days A FACE TO FACE EVALUATION WAS PERFORMED  Joesph JAYSON Likes 01/07/2024, 9:45 AM

## 2024-01-07 NOTE — Consult Note (Signed)
 Consultation  Referring Provider: Rehab - Dr Emeline Primary Care Physician:  Antonette Angeline ORN, NP Primary Gastroenterologist:  unassigned- had seen Dr Jinny [phone] [facility] March 2025  Reason for Consultation: Persistent nausea and vomiting  HPI: Alan Wise is a 53 y.o. 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 He had been seen by our service in March 2025 when he was hospitalized with symptoms consistent with CVA with aphasia and inability to walk.  He had been started on Eliquis  at that time, he had also had problems with breakthrough seizures.  At that time he was seen for dysphagia and concerns with upper esophageal sphincter dysfunction at that time recommendation was made not to pursue EGD and to continue with speech therapy work and recommendations regarding diet at [employer]. During that admission he had MRI and there was concern for suspicion of infiltrative tumor progression into the right cerebral peduncle is also noted to have superimposed scattered acute and subacute lacunar infarcts. He was then admitted at [facility] 12/09/2023 with altered mental status noting increasing weakness difficulty with speech and ambulation. Etiology of the altered mental status not clear at that time, urine drug screen was positive for amphetamines however patient denied any use and mental status returned to baseline prior to discharge.  Had MRI of the brain he was found to have new punctate focus of restricted diffusion in the anterior right temporal lobe.  Neuro consulted and there were no new recommendations made. He was seen by his oncologist on 12/11/2023, Dr. 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  He was placed on Abilify  for behavioral  changes Then readmitted here on 6/27 after his wife found him at home on the floor apparently had fallen and was having significant left-sided weakness, he was dragging his leg while trying to ambulate, and had alteration in mental status.  He had not yet started the apixaban .  Workup here with MRI shows a 3.2 x 1.3 cm focus of patchy restricted diffusion in the right basal ganglia new from the most recent MRI from 12/09/2023 and a new punctate focus of restricted diffusion within the right caudate strongly favored to reflect acute infarcts.  Also has a severe right ACA stenosis He was started on Eliquis , has been followed by neurology. EEG shows cortical dysfunction arising from the right hemisphere, no seizures or definite epileptiform discharges. Has been transferred to rehab.  Has had ongoing expressive aphasia.  He has also been followed by speech pathology and is currently tolerating a dysphagia 3 diet. He was started on steroids by Dr. 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  Patient is alert with expressive aphasia, was upset with my following his wife to participate in the conversation though it is very difficult to follow the patient or understand what he is saying.  He was angry during the interview kept saying no no no.  His wife says he is tired of all the testing. He is not having any abdominal pain, says yes to nausea, his wife says typically he will vomit at least 1 time per day if not more and usually after meals though not always sometimes 30 to 40 minutes later.  Does  not seem to be any pain he just vomits Is not having any dysphagia or odynophagia at this time He did pass speech path modified swallow on 12/24/2023 He has been on a regimen of ODT Zofran  as needed, according to the notes Compazine  was not well-tolerated. He had repeat brain MRI which did not demonstrate any new or  progressive process. Vaslow with neuropsychiatry has suggested doing the Decadron , considering decreasing Lamictal , consider Zyprexa 2.5 or 5 mg nightly for refractory nausea and vomiting and also mood stabilization.  Currently on ODT Zofran  8 mg every 8 hours, Ativan  as needed, Megace , Lamictal  and twice daily PPI.  Patient did have a colonoscopy in 2024/Dr. Wohl no prior EGD.  1 view abdominal film today nonobstructive bowel gas pattern Labs reviewed, a.m. cortisol yesterday 1.6-low TSH 1.56 Potassium 3.4/glucose 134/BUN 15/creatinine 1.08 LFTs within normal limits CBC normal   Past Medical History:  Diagnosis Date   Allergy    Complication of anesthesia    pt reports he is starting to have more difficulty arousing after surgery   Diabetes mellitus (HCC)    GERD (gastroesophageal reflux disease)    Headache    History of kidney stones    Hyperlipidemia    Renal disorder    kidney stones    Past Surgical History:  Procedure Laterality Date   APPLICATION OF CRANIAL NAVIGATION Right 01/17/2021   Procedure: APPLICATION OF CRANIAL NAVIGATION;  Surgeon: Debby Dorn MATSU, MD;  Location[facility] OR;  Service: Neurosurgery;  Laterality: Right;   COLONOSCOPY  09/03/1994   COLONOSCOPY WITH PROPOFOL  N/A 01/07/2023   Procedure: COLONOSCOPY WITH PROPOFOL ;  Surgeon: Jinny Carmine, MD;  Location: [facility] ENDOSCOPY;  Service: Endoscopy;  Laterality: N/A;  NOT TOO EARLY   CYSTOSCOPY WITH STENT PLACEMENT Right 01/25/2016   Procedure: CYSTOSCOPY WITH STENT PLACEMENT;  Surgeon: Redell Lynwood Napoleon, MD;  Location: [facility] ORS;  Service: Urology;  Laterality: Right;   FRAMELESS  BIOPSY WITH BRAINLAB Right 01/17/2021   Procedure: RIGHT STEREOTACTIC BIOPSY OF INSULAR LESION;  Surgeon: Debby Dorn MATSU, MD;  Location: [facility] OR;  Service: Neurosurgery;  Laterality: Right;   KNEE ARTHROSCOPY W/ MENISCAL REPAIR Right 06/23/1988   POLYPECTOMY  01/07/2023   Procedure: POLYPECTOMY;  Surgeon: Jinny Carmine, MD;  Location:  [facility] ENDOSCOPY;  Service: Endoscopy;;   removal of birthmark  06/08/2013   SKIN CANCER EXCISION  06/23/2012   TYMPANOSTOMY TUBE PLACEMENT  08/06/1978   URETEROSCOPY WITH HOLMIUM LASER LITHOTRIPSY Right 01/25/2016   Procedure: URETEROSCOPY WITH HOLMIUM LASER LITHOTRIPSY;  Surgeon: Redell Lynwood Napoleon, MD;  Location: [facility] ORS;  Service: Urology;  Laterality: Right;   URETHRAL STRICTURE DILATATION     visual inspection of vocal cord     1973   WISDOM TOOTH EXTRACTION      Prior to Admission medications   Medication Sig Start Date End Date Taking? Authorizing Provider  amLODipine  (NORVASC ) 5 MG tablet TAKE 1 TABLET (5 MG TOTAL) BY MOUTH DAILY. 11/05/23   Vaslow, Zachary K, MD  apixaban  (ELIQUIS ) 5 MG TABS tablet Take 1 tablet (5 mg total) by mouth 2 (two) times daily. Patient not taking: Reported on 12/18/2023 12/11/23   Vaslow, Zachary K, MD  ARIPiprazole  (ABILIFY ) 5 MG tablet Take 1 tablet (5 mg total) by mouth daily. Patient not taking: Reported on 12/18/2023 12/11/23   Vaslow, Zachary K, MD  aspirin  EC 81 MG tablet Take 81 mg by mouth daily. Swallow whole.    [provider]  lamoTRIgine  (LAMICTAL ) 150 MG tablet Take 150  mg by mouth 2 (two) times daily. 11/16/23   [provider]  propranolol  (INDERAL ) 20 MG tablet TAKE 1 TABLET BY MOUTH TWICE A DAY 11/17/23   Vaslow, Zachary K, MD    Current Facility-Administered Medications  Medication Dose Route Frequency Provider Last Rate Last Admin   0.9 %  sodium chloride  infusion   Intravenous Continuous Engler, Morgan C, DO 100 mL/hr at 01/07/24 1202 New Bag at 01/07/24 1202   acetaminophen  (TYLENOL ) tablet 325-650 mg  325-650 mg Oral Q4H PRN Love, Pamela S, PA-C       alum & mag hydroxide-simeth (MAALOX/MYLANTA) 200-200-20 MG/5ML suspension 30 mL  30 mL Oral Q4H PRN Love, Pamela S, PA-C       amLODipine  (NORVASC ) tablet 5 mg  5 mg Oral Daily Love, Pamela S, PA-C   5 mg at 01/07/24 9076   apixaban  (ELIQUIS ) tablet 5 mg  5 mg Oral BID  Love, Pamela S, PA-C   5 mg at 01/07/24 9077   atorvastatin  (LIPITOR) tablet 40 mg  40 mg Oral Q supper Love, Pamela S, PA-C   40 mg at 01/06/24 8177   bisacodyl  (DULCOLAX) suppository 10 mg  10 mg Rectal Daily PRN Love, Pamela S, PA-C       dexamethasone  (DECADRON ) tablet 2 mg  2 mg Oral Daily Emeline Search C, DO   2 mg at 01/07/24 9077   diphenhydrAMINE  (BENADRYL ) capsule 25 mg  25 mg Oral Q6H PRN Love, Pamela S, PA-C       feeding supplement (ENSURE PLUS HIGH PROTEIN) liquid 237 mL  237 mL Oral TID BM Emeline Search C, DO   237 mL at 01/06/24 0942   guaiFENesin -dextromethorphan (ROBITUSSIN DM) 100-10 MG/5ML syrup 5-10 mL  5-10 mL Oral Q6H PRN Love, Pamela S, PA-C       lamoTRIgine  (LAMICTAL ) tablet 150 mg  150 mg Oral BID Engler, Morgan C, DO   150 mg at 01/07/24 9076   LORazepam  (ATIVAN ) injection 0.5 mg  0.5 mg Intravenous Q6H PRN Love, Pamela S, PA-C       meclizine  (ANTIVERT ) tablet 12.5 mg  12.5 mg Oral BID PRN Engler, Morgan C, DO   12.5 mg at 12/31/23 0825   megestrol  (MEGACE ) 400 MG/10ML suspension 400 mg  400 mg Oral BID Swartz, Zachary T, MD   400 mg at 01/07/24 9077   melatonin tablet 5 mg  5 mg Oral QHS PRN Love, Pamela S, PA-C   5 mg at 12/27/23 2004   multivitamin with minerals tablet 1 tablet  1 tablet Oral Daily Emeline Search C, DO   1 tablet at 01/07/24 9076   ondansetron  (ZOFRAN -ODT) disintegrating tablet 8 mg  8 mg Oral Q8H Engler, Morgan C, DO   8 mg at 01/07/24 9360   pantoprazole  (PROTONIX ) EC tablet 40 mg  40 mg Oral BID AC Engler, Morgan C, DO   40 mg at 01/07/24 0639   polyethylene glycol (MIRALAX  / GLYCOLAX ) packet 17 g  17 g Oral Daily PRN Engler, Morgan C, DO       polyethylene glycol (MIRALAX  / GLYCOLAX ) packet 17 g  17 g Oral Daily [id number] [place], PA-C   17 g at 01/07/24 9077   prochlorperazine  (COMPAZINE ) tablet 2.5 mg  2.5 mg Oral Q8H PRN Engler, Morgan C, DO   2.5 mg at 01/05/24 1213   propranolol  (INDERAL ) tablet 20 mg  20 mg Oral BID Love, Pamela S, PA-C    20 mg at 01/07/24 9076   scopolamine  (  TRANSDERM-SCOP) 1 MG/3DAYS 1.5 mg  1 patch Transdermal Q72H Engler, Morgan C, DO   1.5 mg at 01/06/24 9057   senna-docusate (Senokot-S) tablet 1 tablet  1 tablet Oral BID Emeline Search C, DO   1 tablet at 01/07/24 9077   sodium phosphate  (FLEET) enema 1 enema  1 enema Rectal Once PRN Love, Pamela S, PA-C        Allergies as of 12/24/2023 - Review Complete 12/24/2023  Allergen Reaction Noted   Contrast media [iodinated contrast media] Swelling 01/17/2021    Family History  Problem Relation Age of Onset   Hyperlipidemia Father    Hypertension Father    Diabetes Father    Benign prostatic hyperplasia Father    Stroke Maternal Grandmother    Diabetes Maternal Grandmother    Stroke Paternal Grandmother    Hypertension Paternal Grandmother    Kidney disease Neg Hx    Prostate cancer Neg Hx     Social History   Socioeconomic History   Marital status: Married    Spouse name: Not on file   Number of children: Not on file   Years of education: Not on file   Highest education level: Not on file  Occupational History   Not on file  Tobacco Use   Smoking status: Never   Smokeless tobacco: Never  Vaping Use   Vaping status: Never Used  Substance and Sexual Activity   Alcohol use: Not Currently    Comment: rare   Drug use: No   Sexual activity: Not Currently  Other Topics Concern   Not on file  Social History Narrative   Not on file   Social Drivers of Health   Financial Resource Strain: Low Risk  (12/05/2022)   Received from [facility]   Overall Financial Resource Strain (CARDIA)    Difficulty of Paying Living Expenses: Not hard at all  Food Insecurity: No Food Insecurity (12/18/2023)   Hunger Vital Sign    Worried About Running Out of Food in the Last Year: Never true    Ran Out of Food in the Last Year: Never true  Transportation Needs: No Transportation Needs (12/18/2023)   PRAPARE - Scientist, research (physical sciences) (Medical): No    Lack of Transportation (Non-Medical): No  Physical Activity: Not on file  Stress: Not on file  Social Connections: Moderately Isolated (12/09/2023)   Social Connection and Isolation Panel    Frequency of Communication with Friends and Family: More than three times a week    Frequency of Social Gatherings with Friends and Family: More than three times a week    Attends Religious Services: Never    Database administrator or Organizations: No    Attends Banker Meetings: Never    Marital Status: Married  Catering manager Violence: Not At Risk (12/18/2023)   Humiliation, Afraid, Rape, and Kick questionnaire    Fear of Current or Ex-Partner: No    Emotionally Abused: No    Physically Abused: No    Sexually Abused: No    Review of Systems: Pertinent positive and negative review of systems were noted in the above HPI section.  All other review of systems was otherwise negative.   Physical Exam: Vital signs in last 24 hours: Temp:  [97.5 F (36.4 C)-98.9 F (37.2 C)] 97.6 F (36.4 C) (07/17 1302) Pulse Rate:  [58-62] 58 (07/17 1302) Resp:  [16-17] 16 (07/17 1302) BP: (131-134)/(74-85) 133/81 (07/17 1302) SpO2:  [97 %-  100 %] 100 % (07/17 1302) Last BM Date : 01/02/24 (Per report) General:   Alert,  Well-developed, well-nourished, older WM and cooperative in NAD-wife at bedside, patient with expressive aphasia, expressing anger at night talking with his wife and repeatedly said you therefore within no no no he did not want to be examined or participate in the consultation Head:  Normocephalic and atraumatic.  Psych:  Alert, expressive aphasia, not further examined  Intake/Output from previous day: 07/16 0701 - 07/17 0700 In: 340 [P.O.:340] Out: -  Intake/Output this shift: Total I/O In: 120 [P.O.:120] Out: -   Lab Results: Recent Labs    01/07/24 0443  WBC 10.6*  HGB 17.0  HCT 50.4  PLT 275   BMET Recent Labs     01/06/24 0434 01/07/24 0443  NA 139 139  K 3.4* 3.9  CL 99 98  CO2 29 30  GLUCOSE 134* 113*  BUN 15 16  CREATININE 1.08 1.17  CALCIUM  9.9 10.3   LFT Recent Labs    01/06/24 0434  PROT 6.5  ALBUMIN 3.9  AST 21  ALT 37  ALKPHOS 80  BILITOT 0.7   PT/INR No results for input(s): LABPROT, INR in the last 72 hours. Hepatitis Panel No results for input(s): HEPBSAG, HCVAB, HEPAIGM, HEPBIGM in the last 72 hours.    IMPRESSION:  #66 53 year old white male with 10-day history of persistent daily vomiting usually 30-45 minutes postprandially but not always. Refractory to Zofran  8 mg ODT every 8 hours, twice daily PPI, Decadron  and Lamictal .  Most likely this is neurogenic in setting of his complicated recent history with recurrent CVAs, with most recent acute CVA on admission 12/19/2023 he is also been identified to have a severe right ACA stenosis. 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 cortisol yesterday query adrenal insufficiency contributing   PLAN: Hospitalist service to see today, may benefit from IV steroids, given low cortisol Could add trial of IV or p.o. metoclopramide 5 mg 30 minutes AC Thorazine also sometimes helpful with refractory nausea vomiting. Dr. Buckley had suggested consideration of Zyprexa.  Try to avoid sedation at this point in time, feel endoscopy would be low yield and higher risk inpatient who has had multiple recent CVAs including most recently on 12/19/2023.  Could consider further abdominal imaging with CT abdomen and pelvis to exclude any other structural issues. Will discuss further with Dr. Suzann No [facility]  01/07/2024, 2:58 PM

## 2024-01-07 NOTE — Progress Notes (Signed)
 Original Left FA PIV access occluded;IV fluid on hold until IV Team able to place alternative right PIV. NS restarted at 1506

## 2024-01-08 ENCOUNTER — Telehealth: Payer: Self-pay

## 2024-01-08 LAB — BASIC METABOLIC PANEL WITH GFR
Anion gap: 8 (ref 5–15)
BUN: 15 mg/dL (ref 6–20)
CO2: 28 mmol/L (ref 22–32)
Calcium: 10.1 mg/dL (ref 8.9–10.3)
Chloride: 103 mmol/L (ref 98–111)
Creatinine, Ser: 1.13 mg/dL (ref 0.61–1.24)
GFR, Estimated: >60 mL/min (ref >60)
Glucose, Bld: 124 mg/dL — ABNORMAL HIGH (ref 70–99)
Potassium: 4.4 mmol/L (ref 3.5–5.1)
Sodium: 139 mmol/L (ref 135–145)

## 2024-01-08 LAB — GLUCOSE, CAPILLARY: Glucose-Capillary: 125 mg/dL — ABNORMAL HIGH (ref 70–99)

## 2024-01-08 MED ORDER — DEXAMETHASONE 2 MG PO TABS
2.0000 mg | ORAL_TABLET | Freq: Every day | ORAL | Status: DC
  Administered 2024-01-08 – 2024-01-10 (×3): 2 mg via ORAL
  Filled 2024-01-08 (×4): qty 1

## 2024-01-08 MED ORDER — SODIUM CHLORIDE 0.9 % IV SOLN: Freq: Once | INTRAVENOUS | Status: AC

## 2024-01-08 MED ORDER — METOCLOPRAMIDE HCL 5 MG PO TABS
5.0000 mg | ORAL_TABLET | Freq: Three times a day (TID) | ORAL | Status: DC
  Administered 2024-01-08 – 2024-01-11 (×10): 5 mg via ORAL
  Filled 2024-01-08 (×10): qty 1

## 2024-01-08 NOTE — Progress Notes (Signed)
 Occupational Therapy Session Note  Patient Details  Name: Alan Wise MRN: [id number] Date of Birth: [birth year]  Today's Date: 01/08/2024 OT Individual Time: 0949-1045+1401-1503 OT Individual Time Calculation (min): 56 min    Short Term Goals: Week 2:  OT Short Term Goal 1 (Week 2): Continue progressing toward LTGs  Skilled Therapeutic Interventions/Progress Updates:  Session 1: Pt greeted supine in bed, pt agreeable to OT intervention but gesturing towards bathroom. No pain reported during session   Transfers/bed mobility/functional mobility: pt completed supine>sit with MIN A to elevate trunk. Pt completed functional ambulation into bathroom with no AD and CGA.  Therapeutic activity: pt completed functional ambulation task with pt instructed to retrieve horseshoes from one side of gym and transport them to opposite end to challenge Rw mgmt in tight environment and challenge functional reaching with RUE. Pt completed task with CGA- MIN A for RW mgmt. Donned walker splint to L side to maintain grasp on Rw during functional mobility, graded task up and asked pt to retrieve specific colors of horseshoes.   Pt completed functional reaching task with LUE with pt instructed to stack cones with LUE in standing. Pt needed initial hand over hand assist to complete task but then able to complete task with only intermittent MIN support at L elbow. Pt seemed to be surprised by his success.   Pt completed seated bimanual task to promote improved LUE Active assist ROM with pt instructed to push ball forward/backwards from sitting position. Pt completed same task via elbow flexion/extension.    ADLs:  Grooming: pt stood at sink for oral care with CGA for balance d/t L lean UB dressing:pt donned shirt in standing with MIN A for orientation of shirt. Feel that standing to complete task increased the demand too much for pt.  Footwear: donned shoes with total A for time mgmt Toileting: pt with  continent urine void needing MIN A for 3/3 toileting tasks mostly to pull pants to waist line.   Cognition: pt continues to present with expressive aphasia but following all commands with mod multimodal cues. Pt responding to yes/no questions with 80% accuracy.                 Ended session with pt seated in w/c with all needs within reach and safety belt alarm activated.                         Session 2: Pt greeted supine in bed, pt agreeable to OT intervention.    Unrated pain reported in LUE during PROM, decreased range of motion for pt tolerance.   Transfers/bed mobility/functional mobility:  Pt completed supine>sit with CGA. Ambulatory transfer to w/c with no AD and MIN A, mild posterior LOB needing MIN A to recover.   ADLs: wife requested for pt to shower; however pt declined adamantly but was agreeable to wash up at sink but only his UB.  UB dressing:donned [place] shirt with MIN A for orientation of shirt  Bathing: pt completed UB bathing at sink with MODA for cleanliness.   NMR:  Donned LUE in Arm skate for active assist ROM with pt instructed to reach LUE to various targets with a focus on decreasing tone and managing spasticity in LUE. Pt needed MIN A to reach furthest cone on L side.   Pt completed seated grasping task with pt instructed to grasp bean bags with LUE and reach across midline to place in bucket. Pt needed bean bags  to be placed in hand but then pt able to supinate/pronate wrist as needed as well as flex/extend bicep. Pt had greatest difficulty letting go of bean bags, needing + time and up to [facility] to extend digits.   Transitioned pt to supine on mat and provide gentle PROM to LUE to decrease tone/spasticity.                  Ended session with pt supine in bed with all needs within reach and bed alarm activated.                    Therapy Documentation Precautions:  Precautions Precautions: Fall Recall of Precautions/Restrictions:  Intact Precaution/Restrictions Comments: L visual field cut and L hemipareisis at baseline, expressive > receptive aphasia Restrictions Weight Bearing Restrictions Per Provider Order: No Other Position/Activity Restrictions: L visual field cut at baseline    Therapy/Group: Individual Therapy  Ronal Gift [facility] 01/08/2024, 12:16 PM

## 2024-01-08 NOTE — Progress Notes (Addendum)
 PROGRESS NOTE   Subjective/Complaints:  No events overnight.  Patient complaining of ongoing nausea (uses hands to indicate a steady level); no nausea or emesis today, did eat breakfast.  Endorses he is passing gas, no abdominal pain.   Vitally stable, no complaints of pain overnight. Per report, ate 50% of dinner and 100% of breakfast this a.m.  CT abdomen and pelvis with nonobstructive small left renal stone, otherwise unremarkable.  ROS: limited d/t aphasia  +Nausea  Objective:   CT ABDOMEN PELVIS WO CONTRAST Result Date: 01/07/2024 CLINICAL DATA:  Bowel obstruction suspected. EXAM: CT ABDOMEN AND PELVIS WITHOUT CONTRAST TECHNIQUE: Multidetector CT imaging of the abdomen and pelvis was performed following the standard protocol without IV contrast. RADIATION DOSE REDUCTION: This exam was performed according to the departmental dose-optimization program which includes automated exposure control, adjustment of the mA and/or kV according to patient size and/or use of iterative reconstruction technique. COMPARISON:  Abdominal x-ray same day. CT chest abdomen and pelvis 03/03/2001. FINDINGS: Lower chest: No acute abnormality. Hepatobiliary: No focal liver abnormality is seen. No gallstones, gallbladder wall thickening, or biliary dilatation. Pancreas: Unremarkable. No pancreatic ductal dilatation or surrounding inflammatory changes. Spleen: Normal in size without focal abnormality. Adrenals/Urinary Tract: There are punctate nonobstructing left renal calculi. Otherwise, the kidneys, adrenal glands and bladder are within normal limits. Stomach/Bowel: Stomach is within normal limits. Appendix appears normal. No evidence of bowel wall thickening, distention, or inflammatory changes. Vascular/Lymphatic: Aortic atherosclerosis. No enlarged abdominal or pelvic lymph nodes. Reproductive: Prostate gland is enlarged. Other: No abdominal wall hernia or  abnormality. No abdominopelvic ascites. Musculoskeletal: No fracture is seen. IMPRESSION: 1. No acute localizing process in the abdomen or pelvis. 2. Nonobstructing left renal calculi. 3. Prostatomegaly. 4. Aortic atherosclerosis. Aortic Atherosclerosis (ICD10-I70.0). Electronically Signed   By: Greig Pique M.D.   On: 01/07/2024 20:43   DG Abd 1 View Result Date: 01/07/2024 CLINICAL DATA:  Vomiting EXAM: ABDOMEN - 1 VIEW COMPARISON:  None Available. FINDINGS: The bowel gas pattern is normal. Diffuse increased lucency throughout the abdomen likely due to increased intraperitoneal fat. No radio-opaque calculi or other significant radiographic abnormality are seen. Lower chest is unremarkable. IMPRESSION: Bowel gas pattern is unremarkable. Increased lucency throughout the abdomen likely due to increased intraperitoneal fat. CT can be utilized for further assessment if clinically warranted. Electronically Signed   By: Megan  Zare M.D.   On: 01/07/2024 12:34     Recent Labs    01/07/24 0443  WBC 10.6*  HGB 17.0  HCT 50.4  PLT 275    Recent Labs    01/06/24 0434 01/07/24 0443  NA 139 139  K 3.4* 3.9  CL 99 98  CO2 29 30  GLUCOSE 134* 113*  BUN 15 16  CREATININE 1.08 1.17  CALCIUM  9.9 10.3     Intake/Output Summary (Last 24 hours) at 01/08/2024 0739 Last data filed at 01/07/2024 1600 Gross per 24 hour  Intake 360 ml  Output --  Net 360 ml         Physical Exam: Vital Signs Blood pressure (!) 144/77, pulse (!) 58, temperature 97.6 F (36.4 C), resp. rate 18, height 5' 8.5 (1.74 m), weight 59.6  kg, SpO2 98%.  Constitutional: No distress . Vital signs reviewed.  Sitting up at bedside.  Underweight. HEENT: NCAT, EOMI, oral membranes moist. Neck: supple; anterior tilt to head. Cardiovascular: RRR without murmur. No JVD    Respiratory/Chest: CTA Bilaterally without wheezes or rales. Normal effort    GI/Abdomen: BS +, no grimacing or obvious tenderness to palpation,  non-distended. active dry heaving on exam, continues throughout interview and exam. Ext: no clubbing, cyanosis, or edema Psych: Unable to assess, intermittently agitated.  Neuro:  Awake, alert.   Global aphasia, expressive greater than receptive.  Answers yes/no questions intermittently appropriately, perseverative speech (that which is for most questions). -- improved speech output this AM, several intact and appropriate phrases; worsens with ongoing conversation  L hemineglect --can attend to left side with verbal stimulation--unchanged  Strength: Moving all 4 extremities; unable to assess due to ongoing nausea/vomiting  Cranial nerves: Left facial droop, left tongue weakness, left shoulder droop persistent  Tone: No appreciable tone in left upper extremity; flexor synergy pattern--when able to relax, tone subsides--unchanged  Prior exams: RUE-5 out of 5 throughout LUE-2/5 shoulder abduction, biceps 3/5; triceps 3/5; WE 2/5; grip 4/5; FA no effort/5  RLE- 5-/5 throughout LLE- 3-4 proximally, 4/5 distally- unchanged  Decreased sensation to light touch throughout the left hemibody--unchanged   Assessment/Plan: 1. Functional deficits which require 3+ hours per day of interdisciplinary therapy in a comprehensive inpatient rehab setting. Physiatrist is providing close team supervision and 24 hour management of active medical problems listed below. Physiatrist and rehab team continue to assess barriers to discharge/monitor patient progress toward functional and medical goals  Care Tool:  Bathing    Body parts bathed by patient: Right arm, Left arm, Chest, Abdomen, Front perineal area, Right upper leg, Left upper leg, Right lower leg, Left lower leg, Face, Buttocks   Body parts bathed by helper: Buttocks, Right arm     Bathing assist Assist Level: Supervision/Verbal cueing     Upper Body Dressing/Undressing Upper body dressing   What is the patient wearing?: Pull over shirt     Upper body assist Assist Level: Supervision/Verbal cueing    Lower Body Dressing/Undressing Lower body dressing      What is the patient wearing?: Underwear/pull up, Pants     Lower body assist Assist for lower body dressing: Minimal Assistance - Patient > 75%     Toileting Toileting    Toileting assist Assist for toileting: Minimal Assistance - Patient > 75%     Transfers Chair/bed transfer  Transfers assist     Chair/bed transfer assist level: Supervision/Verbal cueing     Locomotion Ambulation   Ambulation assist      Assist level: Supervision/Verbal cueing Assistive device: Walker-rolling Max distance: 300'   Walk 10 feet activity   Assist     Assist level: Supervision/Verbal cueing Assistive device: Walker-rolling   Walk 50 feet activity   Assist    Assist level: Supervision/Verbal cueing Assistive device: Walker-rolling    Walk 150 feet activity   Assist    Assist level: Supervision/Verbal cueing Assistive device: Walker-rolling    Walk 10 feet on uneven surface  activity   Assist Walk 10 feet on uneven surfaces activity did not occur: Safety/medical concerns   Assist level: Supervision/Verbal cueing Assistive device: Walker-rolling   Wheelchair     Assist Is the patient using a wheelchair?: Yes      Wheelchair assist level: Dependent - Patient 0% Max wheelchair distance: 200 ft    Wheelchair 50  feet with 2 turns activity    Assist        Assist Level: Dependent - Patient 0%   Wheelchair 150 feet activity     Assist      Assist Level: Dependent - Patient 0%   Blood pressure (!) 144/77, pulse (!) 58, temperature 97.6 F (36.4 C), resp. rate 18, height 5' 8.5 (1.74 m), weight 59.6 kg, SpO2 98%.  Medical Problem List and Plan: 1. 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  -7/4 fell last night- head CT looks the same  -7/8: SPV-CGA mobility this AM with OT; Min A for LUE integration. PT improving. SLP appears similar to last admission - per SLP was making progress in OP prior to this - very limited frustration tolerance.   -7/7: Episode of extreme lethargy with left upper extremity movements concerning for seizure after receiving 10 mg of IV Compazine .  EEG with cortical dysfunction from the right frontotemporal region, postictal state, no epileptiform discharges--was in place during another similar event.  CT head unchanged.  Neurology consulted/curbside, likely combination of postictal and medication sensitivity, increasing Lamictal  and reduce Compazine  to 5 mg dosing.  Patient monitored throughout the afternoon, improved. 7-9: Will complete family training remotely due to difficulty coordinating family  7-10:Discussed case with Dr. Buckley; agree that patient is a challenging case with complex intracranial pathology, agree with MRI if nausea/dizziness does not improve in 2 to 3 days or if acute change in neurologic status.  Agree unlikely etiology of seizures.  Will treat with adjustment of antiemetics and start Decadron  1 mg for 3 days; keep low dose.   7/14--dc planned for tomorrow. Given his poor po intake and recent issues with nausea his progress has been slow. Spoke with his wife at length. I'm willing to keep him a bit longer to work on po intake and to improve his balance and safety before sending him home. See nutrition discussion below    7/15: Discussing with Dr. Buckley, ordering MRI brain without contrast given worsening left-sided weakness/inattention and ongoing recurrent emesis.  A.m. cortisol, TSH, T4, CMP ordered.  Considering bolus of high-dose steroids followed by 2 mg daily Decadron  for appetite stimulation--will wait until a.m. cortisol test to not confound results.   Dr. Buckley visiting with patient/family today.    7-17: Tentatively extending length of stay through Monday for ongoing nausea, GI workup.  Extensive discussion of temporary feeding tube with family, they continue to wish to defer at this time.  Consults to hospitalist, gastroenterology as below.   7/18: D/w patient, agreeable to stay through Monday for monitorring of improved PO intake/nausea   2.  Antithrombotics: -DVT/anticoagulation:  Pharmaceutical: Eliquis  5mg  BID- cannot stop             -antiplatelet therapy: N/A  3. Pain Management: Tylenol  PRN.   7-10: No complaints of pain, headaches with nausea or emesis--remains consistent  7-17: Some complaints of abdominal pain today; not reproducible on exam, KUB negative.  Add heat pack as needed for belly pain.  4. Mood/Behavior/Sleep: LCSW to follow for evaluation and support.              -antipsychotic agents: N/A  -Melatonin PRN  -Reports last IRF admission mom encouraged him  to stop his seizure medication. No issues this admission, will watch closely.   -7-10: Intermittent frustration but overall cooperative and participatory  5. Neuropsych/cognition: This patient is not fully capable of making decisions on his own behalf.  - Neuropsych attempted evaluation, unable to appreciably complete an assessment due to global aphasia and poor frustration tolerance  6. Skin/Wound Care: Routine pressure relief measure.    7. Fluids/Electrolytes/severe protein calorie malnutrition: monitor I/O. Routine labs. Cont supplements.   - 7-7 labs stable; placed on 100 mL/h's continuous IV fluids for 24 hours due to nausea/somnolence--DC'd 7-8 7-10: Labs stable.  Will give IV fluids today 75 cc/h given ongoing nausea/retching, poor p.o. intakes. P.o. intakes limited by lack of interest in food, will consult nutrition for a.m. and consider appetite stimulant if antinausea medications ineffective.  See #18. 7/11: Nutrition consult completed, appreciate  recommendations.  Notable patient has had 11% weight loss in the last 3 months.   7/14- still eating next to nothing.po intake recorded at 50cc yesterday   -BMET ok today, add on prealbumin   -begin trial of megace    -staff and family will need to push po   -?PEG   7/15: Megace  400 mg BID started yesterday; ate 25% lunch, 15% dinner.  Briefly discussed possibility of tube feeds with wife, may be agreeable to temporary nasogastric feeds but almost definitely patient would refuse PEG.  Wish to treat is much as possible with medications and workup first before considering tube feeds.  7/16: Steroids today for appetite per Dr. Buckley; patient wishes to limit medications so will hold off on adding olanzepine  7-17: Per wife, has been trying to eat more recently.  Wished to hold off on feeding tube at this time.   7-18: Ate 50% of dinner and 100% of breakfast.  See treatment of nausea as below, plan to monitor p.o. intake through this weekend and discharge Monday if improvement keeps up.  8. T2DM: Hgb A1c-5.4 and diet controlled.  -12/26/23 CBGs fine, not on SSI, no CBG checks ordered-- will tell nursing this is not needed.    9.  HTN: Monitor BP TID--avoid hypoperfusion. Continue Amlodipine  5 mg/day and propranolol  20 mg bid. Blood pressure well-controlled on current regimen; some highs intermittently but resolve    Vitals:   01/05/24 0553 01/05/24 1320 01/05/24 2036 01/05/24 2143  BP: 130/80 (!) 127/90 125/83 125/83   01/06/24 0541 01/06/24 1425 01/06/24 1931 01/07/24 0448  BP: (!) 154/86 136/88 134/74 132/85   01/07/24 1128 01/07/24 1302 01/07/24 1950 01/08/24 0654  BP: 131/82 133/81 128/70 (!) 144/77    10. Dysphagia: NO straws to decrease aspiration events.  --GI consult recommended as retention of solids contribute to aspiration. Per Dr. Lizabeth note this was discussed with GI and who felt patient to be too high risk for GI procedures and recommended follow up after d/c. -SLP following  --no complaints or concerns for aspiration this admission 7-15: Noticed pocketing with therapies today, new behavior.  SLP informed and to evaluate this afternoon--pt refused diet downgrade  7/16: d/w SLP repeat swallow eval. GI consult prior reviewed; did not feel pt's dysfuctnion was amenable to scoping intervention at that time.   7-17: Dysphagia 3 diet per SLP; GI assessment as below--no plan for EGD at this time   11. Seizure d/o; On Lamictal  150 mg am and 200 mg pm-->educate on compliance.  7/4- pt so far is taking meds-  7-7: Lamictal  increased to 200 mg twice daily per neurology due to  concern for postictal state as above--no reports of recurrent seizures   7/15: Recurrent episodes of emesis/nausea without post-event lethargy as seen prior, not improved with seizure medication adjustment--discussed with wife, low clinical suspicion for seizure etiology  7/16: See recs; reduce Lamictal  to 150 mg BID--has remained stable without signs of seizures   12. Oligodendroglioma c/b radiation necrosis: Follow up with Dr. 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    - 7/15: MRI brain today; Dr. Buckley assisting as above. Appreciate his insight and recommendations-- no obvious changes to explain current decline   13. Dyslipidemia: LDL-129 and HDL 27. On Lipitor 40 mg/day.   14. Frequency/Nocturia: Gets up at nights and pt/wife agreed to toileting at 11 pm and 3 am to prevent attempts out of bed.  7/4- pt refused these scheduled get up times, but fell- asked nursing to try and get him up for nocturia Continent of bladder  15. Spasticity - concerned about starting Any spasticity meds due to risk of sedation/confusion - suggest to be assessed for Botulinum toxin for LUE.  7-7: Minimal tone in left upper extremity, and functional strength in limb; perceived pout tone is mostly flexor posturing with left inattention, which he can break with volitional relaxation.  Feel recovery would be stymied by use of Botox at  this time, continue conservative management.    7/15: Ongoing flexor synergy which waxes/wanes with inattention; no appreciable spasticity on exam  16. L inattention/neglect-secondary to #1.  17. Bowels: no BM documented since 7/1 but pt states he's going daily, however history is limited d/t aphasia. Adamant that he had a BM yesterday. Asked nursing to please document.  -01/02/24 no BM documented in 3 days, will go back to scheduled miralax  daily -01/03/24 still no BM documented but pt very adamant he had a BM yesterday; will leave meds as is for now, but would monitor closely  - 7/14 had bm 7/12. Eating nothing though, wouldn't expect much  No BM since 7/12; add daily miralax .   - 7-18: P.o. intakes picking up, KUB yesterday without significant stool burden.  Monitor.  18.  Nausea -recurrent, central vertigo versus gastric irritation from poor p.o. intake/high pill burden  7-7: Compazine  reduce to 5 mg p.o./IV due to somnolence.-->  2.5 mg p.o. as needed, with benefit 7-8  7-10: Discussed case with Dr. Buckley as above; hold off on MRI for now, interventions as follows   Protonix  40 mg twice daily for GI protection   Scheduled Zofran  8 mg every 8 hours; will leave Compazine  as as needed   Decadron  1 mg daily for 3 days   Scopolamine  patch every 72 hours--will hold off on initiating until a.m., to monitor improvement with other measures   IV fluids normal saline 75 cc/h tonight as above   PT vestibular evaluation ordered--will hold off for today given severity of symptoms   7-11: Doing much better with measures as described above, is eating today.  No further dizziness.  Will DC IV fluids, if any issues over the weekend would start scopolamine  patch and consider MRI brain.  - 01/04/24 no complaints again this morning, monitor. See nutrition discussion above  - 7/15: emesis yesterday, nausea today, PO intakes remain poor. Possible high-dose steroids pending AM cortisol as above.   7/16: Start  Decadron  2 mg daily after AM labs, start scopalamine patch, reduce lamictal  as above. Discussed with patient eating food prior to medications.   7/17: AM cortisol low; per hospitalist inaccurate due to incorrect timing of draw.  Would  need 4-day washout of current steroids prior to attempting corticotropin stimulating test.  Other labs without obvious causal factors.  Hospitalist, GI assessing today for further recommendations. - DC scopolamine , meclizine  due to no appreciable benefit - Increase Compazine  back to 5 mg as needed - Consider adjunctive addition of Thorazine for appetite stimulation/nausea - KUB negative; discussing with GI possible CT abdomen and pelvis tonight  7-18: GI, hospitalist evaluated, both signed off.  Appreciate recommendations. - CT abdomen and pelvis with small nonobstructing renal stone; otherwise negative - Per GI recommendations, starting Reglan 5 mg 3 times daily AC - Per hospitalist, consider Florinef 0.05 mg and hydrocortisone 5 mg 3 times daily if high clinical suspicion for adrenal insufficiency; discussed with patient, he refuses escalation but willing to continue current decadron  2 mg daily  18.  AKI.  Creatinine 1.17 from 0.9.  likely due to poor p.o. intakes.  Give 1 L IV fluid tonight, repeat testing in AM.  - 7-18: BMP pending.  P.o. fluid intakes appear improving, will set 50 cc/h overnight for maintenance fluids; can likely DC in 1 to 2 days if p.o. intakes hold up  LOS: 15 days A FACE TO FACE EVALUATION WAS PERFORMED  Joesph JAYSON Likes 01/08/2024, 7:39 AM

## 2024-01-08 NOTE — Telephone Encounter (Signed)
 Copied from CRM [id number]. Topic: General - Other >> Jan 08, 2024  9:44 AM Selinda RAMAN wrote: Reason for CRM: Tinika the Nurse Case Manger with [employer] of the patient called in stating she will be faxing over some information to the provider with her contact information. If there are any questions she can be reached at [phone]

## 2024-01-08 NOTE — Progress Notes (Signed)
 Physical Therapy Session Note  Patient Details  Name: Alan Wise MRN: [id number] Date of Birth: 05/06/1971  Today's Date: 01/08/2024 PT Individual Time: 1100-1155 PT Individual Time Calculation (min): 55 min   Short Term Goals: Week 1:  PT Short Term Goal 1 (Week 1): STG = LTG d/t ELOS Week 2:  PT Short Term Goal 1 (Week 2): STGs = LTGs   Skilled Therapeutic Interventions/Progress Updates:    Session focused on NMR to address L hemi body, balance retraining, coordination, strength and gait.  Pt performed most transfers with overall close S with some cues and assist needed with LUE management on/off of RW with hand splint and cues for sequence. Dynamic gait through obstacle course navigating turns with overall CGA needed for balance and cues for postioning of RW as well L foot clearance. Repeated x 2  trials for repetition. Alternating toe taps to 4 step with  focus on coordination and balance with CGA. Compliant surface for balance retraining and functional  WB through LUE for tone management while performing reaching task with  RUE x 3 trials with seated breaks between sets. NMR on Nustep for reciprocal movement pattern retraining with  BUE  and BLE on level 1 x 6 min. Returned to room via w/c total assist  for time management.  Therapy Documentation Precautions:  Precautions Precautions: Fall Recall of Precautions/Restrictions: Intact Precaution/Restrictions Comments: L visual field cut and L hemipareisis at baseline, expressive > receptive aphasia Restrictions Weight Bearing Restrictions Per Provider Order: No Other Position/Activity Restrictions: L visual field cut at baseline  Pain:  No reports of pain    Therapy/Group: Individual Therapy  Elnor Pizza Sherrell Pizza WENDI Elnor, PT, DPT, CBIS  01/08/2024, 1:39 PM

## 2024-01-08 NOTE — Progress Notes (Signed)
 Patient ID: Alan Wise, male   DOB: [birth year], 53 y.o.   MRN: [id number]  SW called pt wife to inform on co-pay . No HHA preference. SW sent out referral to various HHAs, and waiting on follow-up.   Graeme Jude, MSW, LCSW Office: [phone] Cell: [phone] Fax: [phone]

## 2024-01-08 NOTE — Progress Notes (Signed)
 Physical Therapy Session Note  Patient Details  Name: EMRIK ERHARD MRN: [id number] Date of Birth: 1971-03-10  Today's Date: 01/08/2024 PT Individual Time: 8398-8374 PT Individual Time Calculation (min): 24 min   Short Term Goals: Week 2:  PT Short Term Goal 1 (Week 2): STGs = LTGs  Skilled Therapeutic Interventions/Progress Updates:    Pt presents in room in bed asleep but awakens easily and agreeable to PT. Pt unable to report pain however in no acute distress. Session focused on NMR for dynamic standing balance and visual scanning as well as gait training for RW management. Pt completes bed mobility with mod assist with max cues for sequencing task. Pt completes sit to stand with CGA/close supervision with RW throughout session with assist for L hand placement on hemi splint. Pt ambulates with close supervision 2x~300' to day room provided with cues for positoining within RW as pt demonstrating kicking RW with LLE frequently and difficulty maintaining all 4 legs on floor most speficially with distractions. Pt comes to sitting on EOM requires max cues for visual scanning to sit on mat. Pt then completes scavenger hunt as NMR for scanning to L side with bias for cones on L side and pt required to reach across midline to grab with RUE and stack in therapist hands on L. Pt requires min cueing for RW management during task. Pt able to navigate back to room at end of session with RW ~75% of the way, requires min cues for direction. Pt returns to room and remains seated in [facility] with all needs within reach, cal light in place and chair alarm donned and activated at end of session.   Therapy Documentation Precautions:  Precautions Precautions: Fall Recall of Precautions/Restrictions: Intact Precaution/Restrictions Comments: L visual field cut and L hemipareisis at baseline, expressive > receptive aphasia Restrictions Weight Bearing Restrictions Per Provider Order: No Other Position/Activity  Restrictions: L visual field cut at baseline   Therapy/Group: Individual Therapy  Reche Ohara PT, DPT 01/08/2024, 4:39 PM

## 2024-01-09 MED ORDER — SODIUM CHLORIDE 0.9 % IV SOLN: Freq: Once | INTRAVENOUS | Status: AC

## 2024-01-09 NOTE — Progress Notes (Signed)
 Physical Therapy Session Note  Patient Details  Name: Alan Wise MRN: [id number] Date of Birth: 1971-05-06  Today's Date: 01/09/2024 PT Individual Time: 8594-8554 PT Individual Time Calculation (min): 40 min   Short Term Goals: Week 2:  PT Short Term Goal 1 (Week 2): STGs = LTGs  Skilled Therapeutic Interventions/Progress Updates:    Pt presents in room in bed, agreeable to PT. Pt unable to report pain but in no acute distress throughout session. Session focused on NMR for functional use of LUE, attention to L side, visual tracking and scanning, and therapeutic activities for improving sequencing for tasks. Pt completes bed mobility with min assist, difficulty motor planning anterior lean for trunk to upright. Pt completes sit<>stands with supervision to RW throughout session, ambulates to/from ortho gym with RW with min cues for direction prompted to complete navigation back to room at end of session. Pt completes NMR using BITs with pt able to complete visual scanning x4 min with RUE and LUE (uses drum stick with LUE due to tone) 2 second increase in reaction time for objects on L side of screen however does not require cues to locate, visual pursuit rotator x15 items with LUE only using drum stick. Pt then completes NMR for BUE coordination, core muscle fiber recruitment, and dynamic standing balance including: - seated ball toss with therapist x10 underhand passes, x10 chest passes - seated self ball toss x15 attempts (manages to catch 5) - standing marches holding ball x20 - sit to stands holding ball x7, sit to stands with overhead press x3 - modified sit ups in short sitting holding ball x10 Pt returns to room ambulating with RW (requires cues for navigating obstacles in room with RW) and returns to semi reclined in bed with supervision where he remains with all needs within reach, cal light in place and bed alarm activated at end of session.   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

## 2024-01-09 NOTE — Progress Notes (Signed)
 PROGRESS NOTE   Subjective/Complaints:  Pt doing fine, slept ok, denies pain. LBM yesterday per pt, not documented since 7/9 but suspect this is not reflective of actual BM schedule. Urinating fine. No other complaints or concerns.   ROS: limited d/t aphasia  +Nausea-resolved  Objective:   CT ABDOMEN PELVIS WO CONTRAST Result Date: 01/07/2024 CLINICAL DATA:  Bowel obstruction suspected. EXAM: CT ABDOMEN AND PELVIS WITHOUT CONTRAST TECHNIQUE: Multidetector CT imaging of the abdomen and pelvis was performed following the standard protocol without IV contrast. RADIATION DOSE REDUCTION: This exam was performed according to the departmental dose-optimization program which includes automated exposure control, adjustment of the mA and/or kV according to patient size and/or use of iterative reconstruction technique. COMPARISON:  Abdominal x-ray same day. CT chest abdomen and pelvis 03/03/2001. FINDINGS: Lower chest: No acute abnormality. Hepatobiliary: No focal liver abnormality is seen. No gallstones, gallbladder wall thickening, or biliary dilatation. Pancreas: Unremarkable. No pancreatic ductal dilatation or surrounding inflammatory changes. Spleen: Normal in size without focal abnormality. Adrenals/Urinary Tract: There are punctate nonobstructing left renal calculi. Otherwise, the kidneys, adrenal glands and bladder are within normal limits. Stomach/Bowel: Stomach is within normal limits. Appendix appears normal. No evidence of bowel wall thickening, distention, or inflammatory changes. Vascular/Lymphatic: Aortic atherosclerosis. No enlarged abdominal or pelvic lymph nodes. Reproductive: Prostate gland is enlarged. Other: No abdominal wall hernia or abnormality. No abdominopelvic ascites. Musculoskeletal: No fracture is seen. IMPRESSION: 1. No acute localizing process in the abdomen or pelvis. 2. Nonobstructing left renal calculi. 3. Prostatomegaly.  4. Aortic atherosclerosis. Aortic Atherosclerosis (ICD10-I70.0). Electronically Signed   By: Greig Pique M.D.   On: 01/07/2024 20:43   DG Abd 1 View Result Date: 01/07/2024 CLINICAL DATA:  Vomiting EXAM: ABDOMEN - 1 VIEW COMPARISON:  None Available. FINDINGS: The bowel gas pattern is normal. Diffuse increased lucency throughout the abdomen likely due to increased intraperitoneal fat. No radio-opaque calculi or other significant radiographic abnormality are seen. Lower chest is unremarkable. IMPRESSION: Bowel gas pattern is unremarkable. Increased lucency throughout the abdomen likely due to increased intraperitoneal fat. CT can be utilized for further assessment if clinically warranted. Electronically Signed   By: Megan  Zare M.D.   On: 01/07/2024 12:34     Recent Labs    01/07/24 0443  WBC 10.6*  HGB 17.0  HCT 50.4  PLT 275    Recent Labs    01/07/24 0443 01/08/24 0938  NA 139 139  K 3.9 4.4  CL 98 103  CO2 30 28  GLUCOSE 113* 124*  BUN 16 15  CREATININE 1.17 1.13  CALCIUM  10.3 10.1     Intake/Output Summary (Last 24 hours) at 01/09/2024 1107 Last data filed at 01/09/2024 9391 Gross per 24 hour  Intake 920.38 ml  Output --  Net 920.38 ml         Physical Exam: Vital Signs Blood pressure 113/67, pulse 60, temperature 97.9 F (36.6 C), resp. rate 17, height 5' 8.5 (1.74 m), weight 59.6 kg, SpO2 98%.  Constitutional: No distress . Vital signs reviewed.  Resting in bed  Underweight. HEENT: NCAT, EOMI, oral membranes moist. Neck: supple; anterior tilt to head. Cardiovascular: RRR without murmur. No JVD  Respiratory/Chest: CTA Bilaterally without wheezes or rales. Normal effort    GI/Abdomen: BS +, nonTTP, non-distended, soft. Ext: no clubbing, cyanosis, or edema Psych: flat, calm/cooperative Neuro:  Awake, alert.   Global aphasia, expressive greater than receptive.  Answers yes/no questions intermittently appropriately, perseverative speech (that which is for  most questions). -- improved speech output this AM, several intact and appropriate phrases; worsens with ongoing conversation  L hemineglect --can attend to left side with verbal stimulation--unchanged  PRIOR EXAMS: Strength: Moving all 4 extremities; unable to assess due to ongoing nausea/vomiting  Cranial nerves: Left facial droop, left tongue weakness, left shoulder droop persistent  Tone: No appreciable tone in left upper extremity; flexor synergy pattern--when able to relax, tone subsides--unchanged  Prior exams: RUE-5 out of 5 throughout LUE-2/5 shoulder abduction, biceps 3/5; triceps 3/5; WE 2/5; grip 4/5; FA no effort/5  RLE- 5-/5 throughout LLE- 3-4 proximally, 4/5 distally- unchanged  Decreased sensation to light touch throughout the left hemibody--unchanged   Assessment/Plan: 1. Functional deficits which require 3+ hours per day of interdisciplinary therapy in a comprehensive inpatient rehab setting. Physiatrist is providing close team supervision and 24 hour management of active medical problems listed below. Physiatrist and rehab team continue to assess barriers to discharge/monitor patient progress toward functional and medical goals  Care Tool:  Bathing    Body parts bathed by patient: Right arm, Left arm, Chest, Abdomen, Front perineal area, Right upper leg, Left upper leg, Right lower leg, Left lower leg, Face, Buttocks   Body parts bathed by helper: Buttocks, Right arm     Bathing assist Assist Level: Supervision/Verbal cueing     Upper Body Dressing/Undressing Upper body dressing   What is the patient wearing?: Pull over shirt    Upper body assist Assist Level: Minimal Assistance - Patient > 75%    Lower Body Dressing/Undressing Lower body dressing      What is the patient wearing?: Underwear/pull up, Pants     Lower body assist Assist for lower body dressing: Minimal Assistance - Patient > 75%     Toileting Toileting    Toileting assist  Assist for toileting: Minimal Assistance - Patient > 75%     Transfers Chair/bed transfer  Transfers assist     Chair/bed transfer assist level: Supervision/Verbal cueing     Locomotion Ambulation   Ambulation assist      Assist level: Contact Guard/Touching assist Assistive device: Walker-rolling Max distance: 50'   Walk 10 feet activity   Assist     Assist level: Contact Guard/Touching assist Assistive device: Walker-rolling   Walk 50 feet activity   Assist    Assist level: Contact Guard/Touching assist Assistive device: Walker-rolling    Walk 150 feet activity   Assist    Assist level: Supervision/Verbal cueing Assistive device: Walker-rolling    Walk 10 feet on uneven surface  activity   Assist Walk 10 feet on uneven surfaces activity did not occur: Safety/medical concerns   Assist level: Supervision/Verbal cueing Assistive device: Development worker, international aid     Assist Is the patient using a wheelchair?: Yes      Wheelchair assist level: Dependent - Patient 0% Max wheelchair distance: 200 ft    Wheelchair 50 feet with 2 turns activity    Assist        Assist Level: Dependent - Patient 0%   Wheelchair 150 feet activity     Assist      Assist Level: Dependent - Patient 0%   Blood pressure 113/67,  pulse 60, temperature 97.9 F (36.6 C), resp. rate 17, height 5' 8.5 (1.74 m), weight 59.6 kg, SpO2 98%.  Medical Problem List and Plan: 1. 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  -7/4 fell last night- head CT looks the same  -7/8: SPV-CGA mobility this AM with OT; Min A for LUE integration. PT improving. SLP appears similar to last admission - per SLP was making progress in OP prior to this - very limited  frustration tolerance.   -7/7: Episode of extreme lethargy with left upper extremity movements concerning for seizure after receiving 10 mg of IV Compazine .  EEG with cortical dysfunction from the right frontotemporal region, postictal state, no epileptiform discharges--was in place during another similar event.  CT head unchanged.  Neurology consulted/curbside, likely combination of postictal and medication sensitivity, increasing Lamictal  and reduce Compazine  to 5 mg dosing.  Patient monitored throughout the afternoon, improved. 7-9: Will complete family training remotely due to difficulty coordinating family  7-10:Discussed case with Dr. Buckley; agree that patient is a challenging case with complex intracranial pathology, agree with MRI if nausea/dizziness does not improve in 2 to 3 days or if acute change in neurologic status.  Agree unlikely etiology of seizures.  Will treat with adjustment of antiemetics and start Decadron  1 mg for 3 days; keep low dose.   7/14--dc planned for tomorrow. Given his poor po intake and recent issues with nausea his progress has been slow. Spoke with his wife at length. I'm willing to keep him a bit longer to work on po intake and to improve his balance and safety before sending him home. See nutrition discussion below    7/15: Discussing with Dr. Buckley, ordering MRI brain without contrast given worsening left-sided weakness/inattention and ongoing recurrent emesis.  A.m. cortisol, TSH, T4, CMP ordered.  Considering bolus of high-dose steroids followed by 2 mg daily Decadron  for appetite stimulation--will wait until a.m. cortisol test to not confound results.  Dr. Buckley visiting with patient/family today.    7-17: Tentatively extending length of stay through Monday for ongoing nausea, GI workup.  Extensive discussion of temporary feeding tube with family, they continue to wish to defer at this time.  Consults to hospitalist, gastroenterology as below.   7/18: D/w  patient, agreeable to stay through Monday for monitorring of improved PO intake/nausea   2.  Antithrombotics: -DVT/anticoagulation:  Pharmaceutical: Eliquis  5mg  BID- cannot stop             -antiplatelet therapy: N/A  3. Pain Management: Tylenol  PRN.   7-10: No complaints of pain, headaches with nausea or emesis--remains consistent  7-17: Some complaints of abdominal pain today; not reproducible on exam, KUB negative.  Add heat pack as needed for belly pain.  4. Mood/Behavior/Sleep: LCSW to follow for evaluation and support.              -antipsychotic agents: N/A  -Melatonin PRN  -Reports last IRF admission mom encouraged him to stop his seizure medication. No issues this admission, will watch closely.   -7-10: Intermittent frustration but overall cooperative and participatory  5. Neuropsych/cognition: This patient is not fully capable of making decisions on his own behalf.  - Neuropsych attempted evaluation, unable to appreciably complete an assessment due to  global aphasia and poor frustration tolerance  6. Skin/Wound Care: Routine pressure relief measure.    7. Fluids/Electrolytes/severe protein calorie malnutrition: monitor I/O. Routine labs. Cont supplements.   - 7-7 labs stable; placed on 100 mL/h's continuous IV fluids for 24 hours due to nausea/somnolence--DC'd 7-8 7-10: Labs stable.  Will give IV fluids today 75 cc/h given ongoing nausea/retching, poor p.o. intakes. P.o. intakes limited by lack of interest in food, will consult nutrition for a.m. and consider appetite stimulant if antinausea medications ineffective.  See #18. 7/11: Nutrition consult completed, appreciate recommendations.  Notable patient has had 11% weight loss in the last 3 months.   7/14- still eating next to nothing.po intake recorded at 50cc yesterday   -BMET ok today, add on prealbumin   -begin trial of megace    -staff and family will need to push po   -?PEG   7/15: Megace  400 mg BID started yesterday;  ate 25% lunch, 15% dinner.  Briefly discussed possibility of tube feeds with wife, may be agreeable to temporary nasogastric feeds but almost definitely patient would refuse PEG.  Wish to treat is much as possible with medications and workup first before considering tube feeds.  7/16: Steroids today for appetite per Dr. Buckley; patient wishes to limit medications so will hold off on adding olanzepine  7-17: Per wife, has been trying to eat more recently.  Wished to hold off on feeding tube at this time.   7-18: Ate 50% of dinner and 100% of breakfast.  See treatment of nausea as below, plan to monitor p.o. intake through this weekend and discharge Monday if improvement keeps up.  8. T2DM: Hgb A1c-5.4 and diet controlled.  -12/26/23 CBGs fine, not on SSI, no CBG checks ordered-- will tell nursing this is not needed.    9.  HTN: Monitor BP TID--avoid hypoperfusion. Continue Amlodipine  5 mg/day and propranolol  20 mg bid. 7/19 Blood pressure well-controlled on current regimen; some highs intermittently but resolve    Vitals:   01/05/24 2143 01/06/24 0541 01/06/24 1425 01/06/24 1931  BP: 125/83 (!) 154/86 136/88 134/74   01/07/24 0448 01/07/24 1128 01/07/24 1302 01/07/24 1950  BP: 132/85 131/82 133/81 128/70   01/08/24 0654 01/08/24 1355 01/08/24 2113 01/09/24 0630  BP: (!) 144/77 116/64 138/86 113/67    10. Dysphagia: NO straws to decrease aspiration events.  --GI consult recommended as retention of solids contribute to aspiration. Per Dr. Lizabeth note this was discussed with GI and who felt patient to be too high risk for GI procedures and recommended follow up after d/c. -SLP following --no complaints or concerns for aspiration this admission 7-15: Noticed pocketing with therapies today, new behavior.  SLP informed and to evaluate this afternoon--pt refused diet downgrade  7/16: d/w SLP repeat swallow eval. GI consult prior reviewed; did not feel pt's dysfuctnion was amenable to scoping  intervention at that time.   7-17: Dysphagia 3 diet per SLP; GI assessment as below--no plan for EGD at this time   11. Seizure d/o; On Lamictal  150 mg am and 200 mg pm-->educate on compliance.  7/4- pt so far is taking meds-  7-7: Lamictal  increased to 200 mg twice daily per neurology due to concern for postictal state as above--no reports of recurrent seizures   7/15: Recurrent episodes of emesis/nausea without post-event lethargy as seen prior, not improved with seizure medication adjustment--discussed with wife, low clinical suspicion for seizure etiology  7/16: See recs; reduce Lamictal  to 150 mg BID--has remained stable without signs  of seizures   12. Oligodendroglioma c/b radiation necrosis: Follow up with Dr. 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    - 7/15: MRI brain today; Dr. Buckley assisting as above. Appreciate his insight and recommendations-- no obvious changes to explain current decline   13. Dyslipidemia: LDL-129 and HDL 27. On Lipitor 40 mg/day.   14. Frequency/Nocturia: Gets up at nights and pt/wife agreed to toileting at 11 pm and 3 am to prevent attempts out of bed.  7/4- pt refused these scheduled get up times, but fell- asked nursing to try and get him up for nocturia Continent of bladder  15. Spasticity - concerned about starting Any spasticity meds due to risk of sedation/confusion - suggest to be assessed for Botulinum toxin for LUE.  7-7: Minimal tone in left upper extremity, and functional strength in limb; perceived pout tone is mostly flexor posturing with left inattention, which he can break with volitional relaxation.  Feel recovery would be stymied by use of Botox at this time, continue conservative management.    7/15: Ongoing flexor synergy which waxes/wanes with inattention; no appreciable spasticity on exam  16. L inattention/neglect-secondary to #1.  17. Bowels: no BM documented since 7/1 but pt states he's going daily, however history is limited d/t aphasia.  Adamant that he had a BM yesterday. Asked nursing to please document.  -01/02/24 no BM documented in 3 days, will go back to scheduled miralax  daily -01/03/24 still no BM documented but pt very adamant he had a BM yesterday; will leave meds as is for now, but would monitor closely  - 7/14 had bm 7/12. Eating nothing though, wouldn't expect much  No BM since 7/12; add daily miralax .   - 7-18: P.o. intakes picking up, KUB yesterday without significant stool burden.  Monitor.  -01/09/24 LBM yesterday per pt but nothing documented... monitor  18.  Nausea -recurrent, central vertigo versus gastric irritation from poor p.o. intake/high pill burden  7-7: Compazine  reduce to 5 mg p.o./IV due to somnolence.-->  2.5 mg p.o. as needed, with benefit 7-8  7-10: Discussed case with Dr. Buckley as above; hold off on MRI for now, interventions as follows   Protonix  40 mg twice daily for GI protection   Scheduled Zofran  8 mg every 8 hours; will leave Compazine  as as needed   Decadron  1 mg daily for 3 days   Scopolamine  patch every 72 hours--will hold off on initiating until a.m., to monitor improvement with other measures   IV fluids normal saline 75 cc/h tonight as above   PT vestibular evaluation ordered--will hold off for today given severity of symptoms   7-11: Doing much better with measures as described above, is eating today.  No further dizziness.  Will DC IV fluids, if any issues over the weekend would start scopolamine  patch and consider MRI brain.  - 01/04/24 no complaints again this morning, monitor. See nutrition discussion above  - 7/15: emesis yesterday, nausea today, PO intakes remain poor. Possible high-dose steroids pending AM cortisol as above.   7/16: Start Decadron  2 mg daily after AM labs, start scopalamine patch, reduce lamictal  as above. Discussed with patient eating food prior to medications.   7/17: AM cortisol low; per hospitalist inaccurate due to incorrect timing of draw.  Would need  4-day washout of current steroids prior to attempting corticotropin stimulating test.  Other labs without obvious causal factors.  Hospitalist, GI assessing today for further recommendations. - DC scopolamine , meclizine  due to no appreciable benefit - Increase Compazine  back to  5 mg as needed - Consider adjunctive addition of Thorazine for appetite stimulation/nausea - KUB negative; discussing with GI possible CT abdomen and pelvis tonight  7-18: GI, hospitalist evaluated, both signed off.  Appreciate recommendations. - CT abdomen and pelvis with small nonobstructing renal stone; otherwise negative - Per GI recommendations, starting Reglan  5 mg 3 times daily AC - Per hospitalist, consider Florinef 0.05 mg and hydrocortisone 5 mg 3 times daily if high clinical suspicion for adrenal insufficiency; discussed with patient, he refuses escalation but willing to continue current decadron  2 mg daily -01/09/24 no emesis today, no nausea reported. Monitor  18.  AKI.  Creatinine 1.17 from 0.9.  likely due to poor p.o. intakes.  Give 1 L IV fluid tonight, repeat testing in AM.  - 7-18: BMP pending.  P.o. fluid intakes appear improving, will set 50 cc/h overnight for maintenance fluids; can likely DC in 1 to 2 days if p.o. intakes hold up -01/09/24 BMP yesterday with improved Cr 1.13, PO intake variable. Cont fluids for now. Reordered.   LOS: 16 days A FACE TO FACE EVALUATION WAS PERFORMED  [address] 01/09/2024, 11:07 AM

## 2024-01-09 NOTE — Progress Notes (Signed)
 Occupational Therapy Session Note  Patient Details  Name: Alan Wise MRN: [id number] Date of Birth: 04/15/1971  Today's Date: 01/09/2024 OT Individual Time: 9084-9056 OT Individual Time Calculation (min): 28 min    Short Term Goals: Week 2:  OT Short Term Goal 1 (Week 2): Continue progressing toward LTGs  Skilled Therapeutic Interventions/Progress Updates:    Patient received supine in bed - NT attending to patient.  Patient indicated need to void - walked to bathroom with hand held assist and assist for IV pole management.  Patient seated at edge of bed - given washcloth to wash hands/ face.  Patient becoming agitated with any mention of shower or changing into clean clothing.  Patient demonstrates left arm movement and compares to right arm movement.  Patient quickly tires of this activity.  Breakfast tray in room, and untouched - offered patient breakfast.  Picking up food (scrambled eggs) with hands versus utensils.  Patient again becoming agitated if items on his tray were touched - yelling - That which will be, and NO!  Patient appeared to be searching for item left of midline -  therapist located orange juice and opened for him.  Patient receptive and took a sip.  Patient with ample food in his mouth, so encouraged sips of water to clear mouth.  Used gestures and visual cueing versus language whenever possible.  Patient indicated finished with breakfast and this therapist - ended session early to reduce agitation and allow patient to settle.  Will attempt to see again if schedule allows.  Left in bed with bed alarm engaged and breakfast tray out of field of vision or grasp for safety.  Spoke with nurse and NT to let them know how session went, although they could hear him from the hallway.    Therapy Documentation Precautions:  Precautions Precautions: Fall Recall of Precautions/Restrictions: Intact Precaution/Restrictions Comments: L visual field cut and L hemipareisis at  baseline, expressive > receptive aphasia Restrictions Weight Bearing Restrictions Per Provider Order: No Other Position/Activity Restrictions: L visual field cut at baseline General: General OT Amount of Missed Time: 32 Minutes due to agitation/ refusal  Pain:  No indication of pain       Therapy/Group: Individual Therapy  Alan Wise 01/09/2024, 9:44 AM

## 2024-01-10 NOTE — Progress Notes (Signed)
 Occupational Therapy Session Note  Patient Details  Name: Alan Wise MRN: [id number] Date of Birth: [birth year]  Today's Date: 01/10/2024 OT Individual Time: 1100-1200 OT Individual Time Calculation (min): 60 min    Short Term Goals: Week 1:  OT Short Term Goal 1 (Week 1): STG=LTG d/t ELOS Week 2:  OT Short Term Goal 1 (Week 2): Continue progressing toward LTGs   Skilled Therapeutic Interventions/Progress Updates:    1:1 Pt received in the bed. Pt participated with bathing and dressing at shower level. Pt was agreeable throughout session and less demonstration of low frustration tolerance today. Pt doffed all clothing sitting on the bed and ambulated to the bathroom with RW with hand splint. Pt still requiring cues for attention to left side of environment and body. Pt able to wash all parts with instructional and demonstrative cues for sequencing and organization to get all parts washed. Returned to EOB to dress with items positioned on left side. Pt dress with good use and attempt himself to incorporate his left UE in all tasks and being patient with himself. Pt even threaded a belt through his pants with min A. Pt stood at sink to brush teeth- pt did have eggs left over in mouth but was able to successful clean it out. PT did demonstrate ideational apraxia with deodorant.  Pt ambulated to main gym and continued to focus on functional reach of left Ue with control with UE ranger. Also performed crumbling up paper with left hand (flexion and extension of fingers and wrist) and then placing it in the trash- pt with success but had doubts about his ability to use his hand.   Pt ambulated back to the room and left resting in the bed with wife at bed side.   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

## 2024-01-10 NOTE — Progress Notes (Signed)
 PROGRESS NOTE   Subjective/Complaints:  Pt doing ok, slept ok, denies pain. LBM yesterday per pt and he's very sure of it, but still not documented since 7/9 or maybe 7/12... still suspect this is not reflective of actual BM schedule. Urinating fine. No other complaints or concerns.   ROS: limited d/t aphasia  +Nausea-resolved  Objective:   No results found.    No results for input(s): WBC, HGB, HCT, PLT in the last 72 hours.   Recent Labs    01/08/24 0938  NA 139  K 4.4  CL 103  CO2 28  GLUCOSE 124*  BUN 15  CREATININE 1.13  CALCIUM  10.1     Intake/Output Summary (Last 24 hours) at 01/10/2024 1058 Last data filed at 01/09/2024 1700 Gross per 24 hour  Intake 337 ml  Output --  Net 337 ml         Physical Exam: Vital Signs Blood pressure 122/65, pulse 65, temperature 98.4 F (36.9 C), temperature source Oral, resp. rate 19, height 5' 8.5 (1.74 m), weight 59.6 kg, SpO2 97%.  Constitutional: No distress . Vital signs reviewed.  Sitting up in chair, eating breakfast. Underweight. HEENT: NCAT, EOMI, oral membranes moist. Neck: supple; anterior tilt to head. Cardiovascular: RRR without murmur. No JVD    Respiratory/Chest: CTA Bilaterally without wheezes or rales. Normal effort    GI/Abdomen: BS +, nonTTP, non-distended, soft. Ext: no clubbing, cyanosis, or edema Psych: flat, calm/cooperative Neuro:  Awake, alert.   Global aphasia, expressive greater than receptive.  Answers yes/no questions intermittently appropriately, perseverative speech (that which is for most questions). -- improved speech output this AM, several intact and appropriate phrases; worsens with ongoing conversation  L hemineglect --can attend to left side with verbal stimulation--unchanged  PRIOR EXAMS: Strength: Moving all 4 extremities; unable to assess due to ongoing nausea/vomiting  Cranial nerves: Left facial droop,  left tongue weakness, left shoulder droop persistent  Tone: No appreciable tone in left upper extremity; flexor synergy pattern--when able to relax, tone subsides--unchanged  Prior exams: RUE-5 out of 5 throughout LUE-2/5 shoulder abduction, biceps 3/5; triceps 3/5; WE 2/5; grip 4/5; FA no effort/5  RLE- 5-/5 throughout LLE- 3-4 proximally, 4/5 distally- unchanged  Decreased sensation to light touch throughout the left hemibody--unchanged   Assessment/Plan: 1. Functional deficits which require 3+ hours per day of interdisciplinary therapy in a comprehensive inpatient rehab setting. Physiatrist is providing close team supervision and 24 hour management of active medical problems listed below. Physiatrist and rehab team continue to assess barriers to discharge/monitor patient progress toward functional and medical goals  Care Tool:  Bathing    Body parts bathed by patient: Right arm, Left arm, Chest, Abdomen, Front perineal area, Right upper leg, Left upper leg, Right lower leg, Left lower leg, Face, Buttocks   Body parts bathed by helper: Buttocks, Right arm     Bathing assist Assist Level: Supervision/Verbal cueing     Upper Body Dressing/Undressing Upper body dressing   What is the patient wearing?: Pull over shirt    Upper body assist Assist Level: Minimal Assistance - Patient > 75%    Lower Body Dressing/Undressing Lower body dressing  What is the patient wearing?: Underwear/pull up, Pants     Lower body assist Assist for lower body dressing: Minimal Assistance - Patient > 75%     Toileting Toileting    Toileting assist Assist for toileting: Minimal Assistance - Patient > 75%     Transfers Chair/bed transfer  Transfers assist     Chair/bed transfer assist level: Supervision/Verbal cueing     Locomotion Ambulation   Ambulation assist      Assist level: Supervision/Verbal cueing Assistive device: Walker-rolling Max distance: 400   Walk 10  feet activity   Assist     Assist level: Supervision/Verbal cueing Assistive device: Walker-rolling   Walk 50 feet activity   Assist    Assist level: Supervision/Verbal cueing Assistive device: Walker-rolling    Walk 150 feet activity   Assist    Assist level: Supervision/Verbal cueing Assistive device: Walker-rolling    Walk 10 feet on uneven surface  activity   Assist Walk 10 feet on uneven surfaces activity did not occur: Safety/medical concerns   Assist level: Supervision/Verbal cueing Assistive device: Walker-rolling   Wheelchair     Assist Is the patient using a wheelchair?: No   Wheelchair activity did not occur: N/A  Wheelchair assist level: Dependent - Patient 0% Max wheelchair distance: 200 ft    Wheelchair 50 feet with 2 turns activity    Assist    Wheelchair 50 feet with 2 turns activity did not occur: N/A   Assist Level: Dependent - Patient 0%   Wheelchair 150 feet activity     Assist  Wheelchair 150 feet activity did not occur: N/A   Assist Level: Dependent - Patient 0%   Blood pressure 122/65, pulse 65, temperature 98.4 F (36.9 C), temperature source Oral, resp. rate 19, height 5' 8.5 (1.74 m), weight 59.6 kg, SpO2 97%.  Medical Problem List and Plan: 1. 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  -7/4 fell last night- head CT looks the same  -7/8: SPV-CGA mobility this AM with OT; Min A for LUE integration. PT improving. SLP appears similar to last admission - per SLP was making progress in OP prior to this - very limited frustration tolerance.   -7/7: Episode of extreme lethargy with left upper extremity movements concerning for seizure after receiving 10 mg of IV Compazine .  EEG with cortical dysfunction from the  right frontotemporal region, postictal state, no epileptiform discharges--was in place during another similar event.  CT head unchanged.  Neurology consulted/curbside, likely combination of postictal and medication sensitivity, increasing Lamictal  and reduce Compazine  to 5 mg dosing.  Patient monitored throughout the afternoon, improved. 7-9: Will complete family training remotely due to difficulty coordinating family  7-10:Discussed case with Dr. Buckley; agree that patient is a challenging case with complex intracranial pathology, agree with MRI if nausea/dizziness does not improve in 2 to 3 days or if acute change in neurologic status.  Agree unlikely etiology of seizures.  Will treat with adjustment of antiemetics and start Decadron  1 mg for 3 days; keep low dose.   7/14--dc planned for tomorrow. Given his poor po intake and recent issues with nausea his progress has been slow. Spoke with his wife at length. I'm willing to keep him a bit longer to  work on po intake and to improve his balance and safety before sending him home. See nutrition discussion below    7/15: Discussing with Dr. Buckley, ordering MRI brain without contrast given worsening left-sided weakness/inattention and ongoing recurrent emesis.  A.m. cortisol, TSH, T4, CMP ordered.  Considering bolus of high-dose steroids followed by 2 mg daily Decadron  for appetite stimulation--will wait until a.m. cortisol test to not confound results.  Dr. Buckley visiting with patient/family today.    7-17: Tentatively extending length of stay through Monday for ongoing nausea, GI workup.  Extensive discussion of temporary feeding tube with family, they continue to wish to defer at this time.  Consults to hospitalist, gastroenterology as below.   7/18: D/w patient, agreeable to stay through Monday for monitorring of improved PO intake/nausea   2.  Antithrombotics: -DVT/anticoagulation:  Pharmaceutical: Eliquis  5mg  BID- cannot stop              -antiplatelet therapy: N/A  3. Pain Management: Tylenol  PRN.   7-10: No complaints of pain, headaches with nausea or emesis--remains consistent  7-17: Some complaints of abdominal pain today; not reproducible on exam, KUB negative.  Add heat pack as needed for belly pain.  4. Mood/Behavior/Sleep: LCSW to follow for evaluation and support.              -antipsychotic agents: N/A  -Melatonin PRN  -Reports last IRF admission mom encouraged him to stop his seizure medication. No issues this admission, will watch closely.   -7-10: Intermittent frustration but overall cooperative and participatory  5. Neuropsych/cognition: This patient is not fully capable of making decisions on his own behalf.  - Neuropsych attempted evaluation, unable to appreciably complete an assessment due to global aphasia and poor frustration tolerance  6. Skin/Wound Care: Routine pressure relief measure.    7. Fluids/Electrolytes/severe protein calorie malnutrition: monitor I/O. Routine labs. Cont supplements.   - 7-7 labs stable; placed on 100 mL/h's continuous IV fluids for 24 hours due to nausea/somnolence--DC'd 7-8 7-10: Labs stable.  Will give IV fluids today 75 cc/h given ongoing nausea/retching, poor p.o. intakes. P.o. intakes limited by lack of interest in food, will consult nutrition for a.m. and consider appetite stimulant if antinausea medications ineffective.  See #18. 7/11: Nutrition consult completed, appreciate recommendations.  Notable patient has had 11% weight loss in the last 3 months.   7/14- still eating next to nothing.po intake recorded at 50cc yesterday   -BMET ok today, add on prealbumin   -begin trial of megace    -staff and family will need to push po   -?PEG   7/15: Megace  400 mg BID started yesterday; ate 25% lunch, 15% dinner.  Briefly discussed possibility of tube feeds with wife, may be agreeable to temporary nasogastric feeds but almost definitely patient would refuse PEG.  Wish to treat  is much as possible with medications and workup first before considering tube feeds.  7/16: Steroids today for appetite per Dr. Buckley; patient wishes to limit medications so will hold off on adding olanzepine  7-17: Per wife, has been trying to eat more recently.  Wished to hold off on feeding tube at this time.   7-18: Ate 50% of dinner and 100% of breakfast.  See treatment of nausea as below, plan to monitor p.o. intake through this weekend and discharge Monday if improvement keeps up -01/10/24 intake pretty good this weekend, above 50% for all documented meals.   8. T2DM: Hgb A1c-5.4 and diet controlled.  -12/26/23 CBGs fine, not  on SSI, no CBG checks ordered-- will tell nursing this is not needed.    9.  HTN: Monitor BP TID--avoid hypoperfusion. Continue Amlodipine  5 mg/day and propranolol  20 mg bid. 7/19-20 Blood pressure well-controlled on current regimen; some highs intermittently but resolve    Vitals:   01/06/24 1931 01/07/24 0448 01/07/24 1128 01/07/24 1302  BP: 134/74 132/85 131/82 133/81   01/07/24 1950 01/08/24 0654 01/08/24 1355 01/08/24 2113  BP: 128/70 (!) 144/77 116/64 138/86   01/09/24 0630 01/09/24 1732 01/09/24 1942 01/10/24 0440  BP: 113/67 132/64 123/66 122/65    10. Dysphagia: NO straws to decrease aspiration events.  --GI consult recommended as retention of solids contribute to aspiration. Per Dr. Lizabeth note this was discussed with GI and who felt patient to be too high risk for GI procedures and recommended follow up after d/c. -SLP following --no complaints or concerns for aspiration this admission 7-15: Noticed pocketing with therapies today, new behavior.  SLP informed and to evaluate this afternoon--pt refused diet downgrade  7/16: d/w SLP repeat swallow eval. GI consult prior reviewed; did not feel pt's dysfuctnion was amenable to scoping intervention at that time.   7-17: Dysphagia 3 diet per SLP; GI assessment as below--no plan for EGD at this  time   11. Seizure d/o; On Lamictal  150 mg am and 200 mg pm-->educate on compliance.  7/4- pt so far is taking meds-  7-7: Lamictal  increased to 200 mg twice daily per neurology due to concern for postictal state as above--no reports of recurrent seizures   7/15: Recurrent episodes of emesis/nausea without post-event lethargy as seen prior, not improved with seizure medication adjustment--discussed with wife, low clinical suspicion for seizure etiology  7/16: See recs; reduce Lamictal  to 150 mg BID--has remained stable without signs of seizures   12. Oligodendroglioma c/b radiation necrosis: Follow up with Dr. 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    - 7/15: MRI brain today; Dr. Buckley assisting as above. Appreciate his insight and recommendations-- no obvious changes to explain current decline   13. Dyslipidemia: LDL-129 and HDL 27. On Lipitor 40 mg/day.   14. Frequency/Nocturia: Gets up at nights and pt/wife agreed to toileting at 11 pm and 3 am to prevent attempts out of bed.  7/4- pt refused these scheduled get up times, but fell- asked nursing to try and get him up for nocturia Continent of bladder  15. Spasticity - concerned about starting Any spasticity meds due to risk of sedation/confusion - suggest to be assessed for Botulinum toxin for LUE.  7-7: Minimal tone in left upper extremity, and functional strength in limb; perceived pout tone is mostly flexor posturing with left inattention, which he can break with volitional relaxation.  Feel recovery would be stymied by use of Botox at this time, continue conservative management.    7/15: Ongoing flexor synergy which waxes/wanes with inattention; no appreciable spasticity on exam  16. L inattention/neglect-secondary to #1.  17. Bowels: no BM documented since 7/1 but pt states he's going daily, however history is limited d/t aphasia. Adamant that he had a BM yesterday. Asked nursing to please document.  -01/02/24 no BM documented in 3 days, will go  back to scheduled miralax  daily -01/03/24 still no BM documented but pt very adamant he had a BM yesterday; will leave meds as is for now, but would monitor closely  - 7/14 had bm 7/12. Eating nothing though, wouldn't expect much  No BM since 7/12; add daily miralax .   - 7-18: P.o. intakes  picking up, KUB yesterday without significant stool burden.  Monitor.  -01/09/24 LBM yesterday per pt but nothing documented... monitor  18.  Nausea -recurrent, central vertigo versus gastric irritation from poor p.o. intake/high pill burden  7-7: Compazine  reduce to 5 mg p.o./IV due to somnolence.-->  2.5 mg p.o. as needed, with benefit 7-8  7-10: Discussed case with Dr. Buckley as above; hold off on MRI for now, interventions as follows   Protonix  40 mg twice daily for GI protection   Scheduled Zofran  8 mg every 8 hours; will leave Compazine  as as needed   Decadron  1 mg daily for 3 days   Scopolamine  patch every 72 hours--will hold off on initiating until a.m., to monitor improvement with other measures   IV fluids normal saline 75 cc/h tonight as above   PT vestibular evaluation ordered--will hold off for today given severity of symptoms   7-11: Doing much better with measures as described above, is eating today.  No further dizziness.  Will DC IV fluids, if any issues over the weekend would start scopolamine  patch and consider MRI brain.  - 01/04/24 no complaints again this morning, monitor. See nutrition discussion above  - 7/15: emesis yesterday, nausea today, PO intakes remain poor. Possible high-dose steroids pending AM cortisol as above.   7/16: Start Decadron  2 mg daily after AM labs, start scopalamine patch, reduce lamictal  as above. Discussed with patient eating food prior to medications.   7/17: AM cortisol low; per hospitalist inaccurate due to incorrect timing of draw.  Would need 4-day washout of current steroids prior to attempting corticotropin stimulating test.  Other labs without obvious  causal factors.  Hospitalist, GI assessing today for further recommendations. - DC scopolamine , meclizine  due to no appreciable benefit - Increase Compazine  back to 5 mg as needed - Consider adjunctive addition of Thorazine for appetite stimulation/nausea - KUB negative; discussing with GI possible CT abdomen and pelvis tonight  7-18: GI, hospitalist evaluated, both signed off.  Appreciate recommendations. - CT abdomen and pelvis with small nonobstructing renal stone; otherwise negative - Per GI recommendations, starting Reglan  5 mg 3 times daily AC - Per hospitalist, consider Florinef 0.05 mg and hydrocortisone 5 mg 3 times daily if high clinical suspicion for adrenal insufficiency; discussed with patient, he refuses escalation but willing to continue current decadron  2 mg daily -01/09/24 no emesis today, no nausea reported. Monitor  19.  AKI.  Creatinine 1.17 from 0.9.  likely due to poor p.o. intakes.  Give 1 L IV fluid tonight, repeat testing in AM.  - 7-18: BMP pending.  P.o. fluid intakes appear improving, will set 50 cc/h overnight for maintenance fluids; can likely DC in 1 to 2 days if p.o. intakes hold up -01/09/24 BMP yesterday with improved Cr 1.13, PO intake variable. Cont fluids for now. Reordered.  -01/10/24 documented PO fluids a little less yesterday, but suspect some might not be documented; leave off IVFs overnight and see what labs look like tomorrow.   LOS: 17 days A FACE TO FACE EVALUATION WAS PERFORMED  [address] Heith Haigler 01/10/2024, 10:58 AM

## 2024-01-10 NOTE — Progress Notes (Signed)
 Speech Language Pathology Daily Session Note  Patient Details  Name: Alan Wise MRN: [id number] Date of Birth: [birth year]  Today's Date: 01/10/2024 SLP Individual Time: 8694-8654 SLP Individual Time Calculation (min): 40 min  Short Term Goals: Week 2: SLP Short Term Goal 1 (Week 2): STGs = LTGs d/t ELOS  Skilled Therapeutic Interventions:  Patient seen for ST session targeting communication goals. Pt sitting upright in bed finishing lunch upon SLP arrival; wife present at beginning of session. He requested additional time to complete his lunch so SLP returned 5 minutes later than scheduled start time. Denied pain. SLP facilitated language tasks targeting aphasia. Pt counted 1-30 when provided min A. He verbalized the months of the year with 75% acc when provided min A and fading simultaneous production in 3 occurrences. Pt answered y/n questions when provided min A for clarification. He continues to benefit from increased wait time and verbal cueing to increase his accuracy. SLP spoke with pt about the option of AAC to augment his communication in outpatient ST. Pt states he is not interested at this time. Pt left in bed with call bell in reach and bed alarm activated. Continue ST POC until pt's upcoming d/c.    Pain Pain Assessment Pain Scale: 0-10 Pain Score: 0-No pain  Therapy/Group: Individual Therapy  Waddell JONETTA Novak, MA CCC-SLP 01/10/2024, 3:26 PM

## 2024-01-11 ENCOUNTER — Encounter: Payer: Self-pay | Admitting: Internal Medicine

## 2024-01-11 ENCOUNTER — Other Ambulatory Visit (HOSPITAL_COMMUNITY): Payer: Self-pay

## 2024-01-11 ENCOUNTER — Telehealth: Payer: Self-pay

## 2024-01-11 LAB — BASIC METABOLIC PANEL WITH GFR
Anion gap: 10 (ref 5–15)
BUN: 14 mg/dL (ref 6–20)
CO2: 24 mmol/L (ref 22–32)
Calcium: 9.3 mg/dL (ref 8.9–10.3)
Chloride: 104 mmol/L (ref 98–111)
Creatinine, Ser: 0.76 mg/dL (ref 0.61–1.24)
GFR, Estimated: >60 mL/min (ref >60)
Glucose, Bld: 167 mg/dL — ABNORMAL HIGH (ref 70–99)
Potassium: 4 mmol/L (ref 3.5–5.1)
Sodium: 138 mmol/L (ref 135–145)

## 2024-01-11 LAB — CBC
HCT: 39.9 % (ref 39.0–52.0)
Hemoglobin: 13.8 g/dL (ref 13.0–17.0)
MCH: 29.4 pg (ref 26.0–34.0)
MCHC: 34.6 g/dL (ref 30.0–36.0)
MCV: 84.9 fL (ref 80.0–100.0)
Platelets: 211 K/uL (ref 150–400)
RBC: 4.7 MIL/uL (ref 4.22–5.81)
RDW: 12.6 % (ref 11.5–15.5)
WBC: 9.9 K/uL (ref 4.0–10.5)
nRBC: 0 % (ref 0.0–0.2)

## 2024-01-11 MED ORDER — PANTOPRAZOLE SODIUM 40 MG PO TBEC
40.0000 mg | DELAYED_RELEASE_TABLET | Freq: Two times a day (BID) | ORAL | 0 refills | Status: DC
  Filled 2024-01-11: qty 60, 30d supply, fill #0

## 2024-01-11 MED ORDER — DEXAMETHASONE 0.5 MG PO TABS: 0.5000 mg | ORAL_TABLET | Freq: Every day | ORAL | Status: DC

## 2024-01-11 MED ORDER — SENNOSIDES-DOCUSATE SODIUM 8.6-50 MG PO TABS
2.0000 | ORAL_TABLET | Freq: Every day | ORAL | 0 refills | Status: DC
  Filled 2024-01-11: qty 60, 30d supply, fill #0

## 2024-01-11 MED ORDER — ATORVASTATIN CALCIUM 40 MG PO TABS
40.0000 mg | ORAL_TABLET | Freq: Every day | ORAL | 0 refills | Status: DC
  Filled 2024-01-11: qty 30, 30d supply, fill #0

## 2024-01-11 MED ORDER — POLYETHYLENE GLYCOL 3350 17 GM/SCOOP PO POWD
17.0000 g | Freq: Every day | ORAL | 0 refills | Status: DC
  Filled 2024-01-11: qty 238, 14d supply, fill #0

## 2024-01-11 MED ORDER — ACETAMINOPHEN 325 MG PO TABS: 325.0000 mg | ORAL_TABLET | ORAL | Status: AC | PRN

## 2024-01-11 MED ORDER — MEGESTROL ACETATE 400 MG/10ML PO SUSP
400.0000 mg | Freq: Two times a day (BID) | ORAL | 0 refills | Status: DC
  Filled 2024-01-11: qty 600, 30d supply, fill #0

## 2024-01-11 MED ORDER — DEXAMETHASONE 2 MG PO TABS
2.0000 mg | ORAL_TABLET | Freq: Every day | ORAL | Status: DC
  Administered 2024-01-11: 2 mg via ORAL
  Filled 2024-01-11: qty 1

## 2024-01-11 MED ORDER — METOCLOPRAMIDE HCL 5 MG PO TABS
5.0000 mg | ORAL_TABLET | Freq: Three times a day (TID) | ORAL | 0 refills | Status: DC
  Filled 2024-01-11: qty 90, 30d supply, fill #0

## 2024-01-11 MED ORDER — ADULT MULTIVITAMIN W/MINERALS CH
1.0000 | ORAL_TABLET | Freq: Every day | ORAL | 0 refills | Status: DC
  Filled 2024-01-11: qty 30, 30d supply, fill #0

## 2024-01-11 MED ORDER — DEXAMETHASONE 0.5 MG PO TABS: 1.0000 mg | ORAL_TABLET | Freq: Every day | ORAL | Status: DC

## 2024-01-11 MED ORDER — PROCHLORPERAZINE MALEATE 5 MG PO TABS
5.0000 mg | ORAL_TABLET | Freq: Three times a day (TID) | ORAL | 0 refills | Status: DC | PRN
  Filled 2024-01-11: qty 30, 10d supply, fill #0

## 2024-01-11 MED ORDER — SENNOSIDES-DOCUSATE SODIUM 8.6-50 MG PO TABS
1.0000 | ORAL_TABLET | Freq: Two times a day (BID) | ORAL | 0 refills | Status: DC
  Filled 2024-01-11: qty 60, 30d supply, fill #0

## 2024-01-11 MED ORDER — PROPRANOLOL HCL 20 MG PO TABS
20.0000 mg | ORAL_TABLET | Freq: Two times a day (BID) | ORAL | 0 refills | Status: DC
  Filled 2024-01-11: qty 60, 30d supply, fill #0

## 2024-01-11 MED ORDER — LAMOTRIGINE 150 MG PO TABS
150.0000 mg | ORAL_TABLET | Freq: Two times a day (BID) | ORAL | 0 refills | Status: DC
  Filled 2024-01-11: qty 60, 30d supply, fill #0

## 2024-01-11 MED ORDER — APIXABAN 5 MG PO TABS
5.0000 mg | ORAL_TABLET | Freq: Two times a day (BID) | ORAL | 0 refills | Status: DC
  Filled 2024-01-11: qty 60, 30d supply, fill #0

## 2024-01-11 MED ORDER — DEXAMETHASONE 1 MG PO TABS
ORAL_TABLET | ORAL | 0 refills | Status: DC
  Filled 2024-01-11: qty 23, 20d supply, fill #0

## 2024-01-11 MED ORDER — ENSURE PLUS HIGH PROTEIN PO LIQD: 237.0000 mL | Freq: Three times a day (TID) | ORAL | Status: DC

## 2024-01-11 MED ORDER — ONDANSETRON 8 MG PO TBDP
8.0000 mg | ORAL_TABLET | Freq: Three times a day (TID) | ORAL | 0 refills | Status: DC
  Filled 2024-01-11: qty 18, 6d supply, fill #0

## 2024-01-11 MED ORDER — AMLODIPINE BESYLATE 5 MG PO TABS
5.0000 mg | ORAL_TABLET | Freq: Every day | ORAL | 0 refills | Status: DC
  Filled 2024-01-11: qty 30, 30d supply, fill #0

## 2024-01-11 NOTE — Discharge Summary (Signed)
 Physician Discharge Summary  Patient ID: Alan Wise MRN: [id number] DOB/AGE: 52-May-1972 52 y.o.  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 EXAM: CT ABDOMEN AND PELVIS WITHOUT CONTRAST TECHNIQUE: Multidetector CT imaging of the abdomen and pelvis was performed following the standard protocol without IV contrast. RADIATION DOSE REDUCTION: This exam was performed according to the departmental dose-optimization program which includes automated exposure control, adjustment of the mA and/or kV according to patient size and/or use of iterative reconstruction technique. COMPARISON:  Abdominal x-ray same day. CT chest abdomen and pelvis 03/03/2001. FINDINGS: Lower chest: No acute abnormality. Hepatobiliary: No focal liver abnormality is seen. No gallstones, gallbladder wall thickening, or biliary dilatation. Pancreas: Unremarkable. No pancreatic ductal dilatation or surrounding inflammatory changes. Spleen: Normal in size without focal abnormality. Adrenals/Urinary Tract: There are punctate nonobstructing left renal calculi. Otherwise, the kidneys, adrenal glands and bladder are within normal limits. Stomach/Bowel: Stomach is within normal limits. Appendix appears normal. No evidence of bowel wall thickening, distention, or inflammatory changes. Vascular/Lymphatic: Aortic atherosclerosis. No enlarged abdominal or pelvic lymph nodes. Reproductive: Prostate gland is enlarged. Other: No abdominal wall hernia or abnormality. No abdominopelvic ascites. Musculoskeletal: No fracture is seen. IMPRESSION:  1. No acute localizing process in the abdomen or pelvis. 2. Nonobstructing left renal calculi. 3. Prostatomegaly. 4. Aortic atherosclerosis. Aortic Atherosclerosis (ICD10-I70.0). Electronically Signed   By: Greig Pique M.D.   On: 01/07/2024 20:43   DG Abd 1 View Result Date: 01/07/2024 CLINICAL DATA:  Vomiting EXAM: ABDOMEN - 1 VIEW COMPARISON:  None Available. FINDINGS: The bowel gas pattern is normal. Diffuse increased lucency throughout the abdomen likely due to increased intraperitoneal fat. No radio-opaque calculi or other significant radiographic abnormality are seen. Lower chest is unremarkable. IMPRESSION: Bowel gas pattern is unremarkable. Increased lucency throughout the abdomen likely due to increased intraperitoneal fat. CT can be utilized for further assessment if clinically warranted. Electronically Signed   By: Megan  Zare M.D.   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 COMPARISON:  CT of the head dated December 28, 2023 and MRI of the head dated December 23, 2023. FINDINGS: Brain: There has been interval evolution of nonhemorrhagic infarcts involving the right cingulate gyrus and the right corona radiata and basal ganglia, as well as the left lentiform nucleus. There is residual increased diffusion signal within the previously treated right frontal lobe mass, which continues to demonstrate hemosiderin staining and surrounding T2 signal hyperintensity. There are no new areas of restricted diffusion present. There is generalized cerebral volume loss and there are confluent zones of increased T2 signal in the periventricular white matter. There is will air in degeneration of the right  cerebral peduncle. Vascular: Normal flow voids. Skull and upper cervical spine: Normal marrow signal. No osseous lesions. Sinuses/Orbits: Mild mucosal disease within the ethmoid air cells and left maxillary sinus. Normal orbits. Other: None. IMPRESSION: 1. Interval evolution of the nonhemorrhagic  infarcts involving the right single lid gyrus, right basal ganglia and periventricular white matter and left lentiform nucleus. 2. Stable appearance of right frontal lobe previously treated neoplasm, with hemosiderin staining from prior hemorrhage and surrounding gliosis. No adverse interval change. Electronically Signed   By: [name] M.D.   On: 01/05/2024 15:46   CT HEAD WO CONTRAST ( ) Result Date: 12/28/2023 CLINICAL DATA:  seizure, recent nonhemorrhagic infarcts and oligodendroglioma status post treatment. EXAM: CT HEAD WITHOUT CONTRAST TECHNIQUE: Contiguous axial images were obtained from the base of the skull through the vertex without intravenous contrast. RADIATION DOSE REDUCTION: This exam was performed according to the departmental dose-optimization program which includes automated exposure control, adjustment of the mA and/or kV according to patient size and/or use of iterative reconstruction technique. COMPARISON:  CT of the head dated in December 24, 2023 an MRI of the brain dated December 23, 2023. FINDINGS: Brain: Areas of dystrophic calcification are again noted within the right frontal lobe and along the medial appended mole surface of the atrium of the right lateral ventricle. There is an evolving nonhemorrhagic infarct in fall thing the right cingulate gyrus, with decreased cytotoxic edema in the interim. There is no evidence of hemorrhagic conversion. Evolving nonhemorrhagic infarcts are also noted within the right corona radiata and basal ganglia. There is no significant mass effect or midline shift. Vascular: Moderate calcific atheromatous disease. Skull: Right frontal burr craniotomy sites.   Intact otherwise. Sinuses/Orbits: Moderate mucosal disease within the ethmoid air cells and floor of the left frontal sinus. Normal orbits. Other: Clear mastoid air cells. IMPRESSION: 1. Evolving nonhemorrhagic infarcts involving the right cingulate gyrus and right basal ganglia. 2. Stable dystrophic calcification within the right frontal lobe and periatrial white matter from previously treated neoplasm. Electronically Signed   By: [name] M.D.   On: 12/28/2023 11:46   EEG adult Result Date: 12/28/2023 [name], MD     12/28/2023 11:26 AM Patient Name: Alan Wise MRN: [id number] Epilepsy Attending: [name] Shelton Referring Physician/Provider: [name], PA-C Date: 12/28/2023 Duration: 21.50 mins Patient history: 53yo M with h/o epilepsy getting eeg to evaluate fors eizure Level of alertness: Awake, asleep AEDs during EEG study: LTG Technical aspects: This EEG study was done with scalp electrodes positioned according to the 10-20 International system of electrode placement. Electrical activity was reviewed with band pass filter of 1-70Hz , sensitivity of 7 uV/mm, display speed of 42mm/sec with a 60Hz  notched filter applied as appropriate. EEG data were recorded continuously and digitally stored.  Video monitoring was available and reviewed as appropriate. Description: The posterior dominant rhythm consists of 8-9 Hz activity of moderate voltage (25-35 uV) seen predominantly in posterior head regions, symmetric and reactive to eye opening and eye closing. Sleep was characterized by vertex waves, sleep spindles (12 to 14 Hz), maximal frontocentral region. EEG showed continuous 3 to 6 Hz theta-delta slowing in right frontotemporal region. Hyperventilation and photic stimulation were not performed.    ABNORMALITY - Continuous slow, right frontotemporal region  IMPRESSION: This study is suggestive of cortical dysfunction arising from right frontotemporal region likely secondary to underlying  strokes, postictal state. No seizures or epileptiform discharges were seen throughout the recording.  Priyanka O Yadav    VAS US  CAROTID Result Date: 12/25/2023 Carotid Arterial Duplex Study Patient Name:  Alan Wise  Date of Exam:   12/24/2023 Medical Rec #: [id number]          Accession #:    [id number] Date  of Birth: 1971-06-18         Patient Gender: M Patient Age:   33 years Exam Location:  [facility] Procedure:      VAS US  CAROTID Referring Phys: DEVON SHAFER --------------------------------------------------------------------------------  Indications:       CVA. Risk Factors:      Hyperlipidemia, Diabetes. Other Factors:     GERD. Comparison Study:  No priors. Performing Technologist: Ricka Sturdivant-Jones RDMS, RVT  Examination Guidelines: A complete evaluation includes B-mode imaging, spectral Doppler, color Doppler, and power Doppler as needed of all accessible portions of each vessel. Bilateral testing is considered an integral part of a complete examination. Limited examinations for reoccurring indications may be performed as noted.  Right Carotid Findings: +----------+--------+--------+--------+------------------+------------------+           PSV cm/sEDV cm/sStenosisPlaque DescriptionComments           +----------+--------+--------+--------+------------------+------------------+ CCA Prox  104     16                                                   +----------+--------+--------+--------+------------------+------------------+ CCA Distal89      18                                intimal thickening +----------+--------+--------+--------+------------------+------------------+ ICA Prox  74      20      1-39%   homogeneous                          +----------+--------+--------+--------+------------------+------------------+ ICA Mid   66      18                                                    +----------+--------+--------+--------+------------------+------------------+ ICA Distal60      22                                                   +----------+--------+--------+--------+------------------+------------------+ ECA       118     11                                                   +----------+--------+--------+--------+------------------+------------------+ +----------+--------+-------+----------------+-------------------+           PSV cm/sEDV cmsDescribe        Arm Pressure (mmHG) +----------+--------+-------+----------------+-------------------+ [id number]            Multiphasic, WNL                    +----------+--------+-------+----------------+-------------------+ +---------+--------+--+--------+--+---------+ VertebralPSV cm/s39EDV cm/s13Antegrade +---------+--------+--+--------+--+---------+  Left Carotid Findings: +----------+--------+--------+--------+------------------+--------+           PSV cm/sEDV cm/sStenosisPlaque DescriptionComments +----------+--------+--------+--------+------------------+--------+ CCA Prox  111     15                                         +----------+--------+--------+--------+------------------+--------+  CCA Distal109     19                                         +----------+--------+--------+--------+------------------+--------+ ICA Prox  56      13      1-39%   calcific                   +----------+--------+--------+--------+------------------+--------+ ICA Mid   57      19                                         +----------+--------+--------+--------+------------------+--------+ ICA Distal56      21                                         +----------+--------+--------+--------+------------------+--------+ ECA       78      8                                          +----------+--------+--------+--------+------------------+--------+  +----------+--------+--------+----------------+-------------------+           PSV cm/sEDV cm/sDescribe        Arm Pressure (mmHG) +----------+--------+--------+----------------+-------------------+ [id number]             Multiphasic, WNL                    +----------+--------+--------+----------------+-------------------+ +---------+--------+--+--------+--+---------+ VertebralPSV cm/s42EDV cm/s10Antegrade +---------+--------+--+--------+--+---------+   Summary: Right Carotid: Velocities in the right ICA are consistent with a 1-39% stenosis. Left Carotid: Velocities in the left ICA are consistent with a 1-39% stenosis. Vertebrals:  Bilateral vertebral arteries demonstrate antegrade flow. Subclavians: Normal flow hemodynamics were seen in bilateral subclavian              arteries. *See table(s) above for measurements and observations.  Electronically signed by Eather Popp MD on 12/25/2023 at 11:18:24 AM.    Final    CT HEAD WO CONTRAST ( ) Result Date: 12/25/2023 CLINICAL DATA:  Head trauma, minor, normal mental status (Age 74-64y) S/ FALL. EXAM: CT HEAD WITHOUT CONTRAST TECHNIQUE: Contiguous axial images were obtained from the base of the skull through the vertex without intravenous contrast. RADIATION DOSE REDUCTION: This exam was performed according to the departmental dose-optimization program which includes automated exposure control, adjustment of the mA and/or kV according to patient size and/or use of iterative reconstruction technique. COMPARISON:  CT head 12/22/2023.  MRI head 12/23/2023. FINDINGS: Brain: Similar oval vein right MCA and ACA territory infarcts without acute hemorrhage or progressive mass effect. Additional scattered white matter hypodensities, compatible with chronic microvascular ischemic disease. No hydrocephalus. Cerebral atrophy. Similar dystrophic calcifications in the right frontal and periatrial region. Vascular: No hyperdense vessel identified. Skull: No  acute fracture. Sinuses/Orbits: Paranasal sinus mucosal thickening. No acute orbital findings. IMPRESSION: 1. Similar right ACA and MCA territory infarcts without progressive mass effect or acute hemorrhage. MRI could provide more sensitive evaluation for new/interval acute infarct if clinically warranted. 2. Similar appearance of treated tumor with dystrophic calcifications. Electronically Signed   By: Gilmore GORMAN Molt M.D.   On: 12/25/2023 00:19   DG Swallowing  Func-Speech Pathology Result Date: 12/24/2023 Table formatting from the original result was not included. Modified Barium Swallow Study Patient Details Name: Alan Wise MRN: [id number] Date of Birth: Aug 14, 1970 Today's Date: 12/24/2023 HPI/PMH: HPI: 53 y.o. male [facility] 12/18/23 after falling two days prior with residual L sided weakness and somnolence. Brain MRI showed new patchy restricted diffusion in R basal ganglia, new punctuate focus of restricted diffusion in R caudate head. Recent admit 6/17-6/19 with anterior R temporal lobe punctate restricted diffusion. PMHx: T2DM, GERD, CVA w/ L sided weakness, oligodendroglioma Clinical Impression: Pt presents with a mild pharyngeal dysphagia and a primary esophageal dysphagia that is likely contributing to pharyngeal deficits. Recommend follow up with GI to determine if pt is a candidate for any intervention to aid esophageal clearance of solids and liquids. Oral phase judged to be WNL. Pharyngeal deficits characterized by reduced base of tongue retraction, occasional mistiming of epiglottic inversion (vs incomplete inversion) and laryngeal vestibule closure, which contributed to aspiration events. Reduced distention of the UES was the greatest pharyngeal concern. Concerns for esophageal dysphagia included moderate retention of pudding and solid in upper esophagus and backflow of thin liquids (by straw) through the UES. Retention of solids likely contributed to aspiration event of thin liquid  wash that followed solid trials. During esophageal sweep, retention of liquids observed in the lower esophagus with some retrograde flow.  This residue did eventually pass to the stomach. Findings: -All aspiration events were audible and minimal. -There was audible aspiration of nectar-thick liquids x1 by cup during the swallow, suspect due to mistiming of epiglottic inversion and laryngeal vestibule closure. -There was audible aspiration of thin liquid wash that followed solid trial. -There was pharyngeal residue primarily in the UES post swallow. Residue amount increased with increased viscosity. -special note: pt does have calcification of portions of the airway and suspected in portion of artery around the anterior upper esophagus. This is mentioned because calcified areas move with swallow initiation and it briefly looks like aspiration. Detailed diet recommendations and aspiration precautions outlined below. Despite aspiration of thin liquids and ongoing risk of aspiration, recommend continue this diet given pt has not exhibited concerns for pneumonia and pt is at a risk of aspirated nectar-thick liquids. Research shows the aspiration of thickened liquids can be more detrimental to the lungs; therefore, we will continue to monitor his tolerance of thin liquids closely. SLP will continue to follow for language and swallowing intervention. Factors that may increase risk of adverse event in presence of aspiration [name] & [name] 2021): No data recorded Recommendations/Plan: Swallowing Evaluation Recommendations Swallowing Evaluation Recommendations Recommendations: PO diet PO Diet Recommendation: Thin liquids (Level 0); Dysphagia 3 (Mechanical soft) Liquid Administration via: Cup; No straw Medication Administration: Whole meds with puree (consider crushing if he is having pill dysphagia) Supervision: Patient able to self-feed; Intermittent supervision/cueing for swallowing strategies Swallowing strategies  :  Slow rate; Small bites/sips; Follow solids with liquids Postural changes: Position pt fully upright for meals; Stay upright 30-60 min after meals Oral care recommendations: Oral care BID (2x/day) Recommended consults: Consider GI consultation Treatment Plan Treatment Plan Treatment recommendations: Therapy as outlined in treatment plan below Follow-up recommendations: Acute inpatient rehab (3 hours/day) Functional status assessment: Patient has had a recent decline in their functional status and demonstrates the ability to make significant improvements in function in a reasonable and predictable amount of time. Treatment frequency: Min 2x/week Treatment duration: 4 weeks Interventions: Aspiration precaution training; Diet toleration management by SLP; Compensatory techniques; Patient/family  education; Respiratory muscle strength training Recommendations Recommendations for follow up therapy are one component of a multi-disciplinary discharge planning process, led by the attending physician.  Recommendations may be updated based on patient status, additional functional criteria and insurance authorization. Assessment: Orofacial Exam: Orofacial Exam Oral Cavity: Oral Hygiene: WFL Oral Cavity - Dentition: Adequate natural dentition Orofacial Anatomy: WFL Oral Motor/Sensory Function: WFL Anatomy: Anatomy: WFL Boluses Administered: Boluses Administered Boluses Administered: Thin liquids (Level 0); Mildly thick liquids (Level 2, nectar thick); Puree; Solid  Oral Impairment Domain: Oral Impairment Domain Lip Closure: No labial escape Tongue control during bolus hold: Cohesive bolus between tongue to palatal seal Bolus preparation/mastication: Timely and efficient chewing and mashing Bolus transport/lingual motion: Brisk tongue motion Oral residue: Trace residue lining oral structures Location of oral residue : Tongue Initiation of pharyngeal swallow : Posterior angle of the ramus  Pharyngeal Impairment Domain: Pharyngeal  Impairment Domain Soft palate elevation: No bolus between soft palate (SP)/pharyngeal wall (PW) Laryngeal elevation: Complete superior movement of thyroid  cartilage with complete approximation of arytenoids to epiglottic petiole Anterior hyoid excursion: Complete anterior movement Epiglottic movement: Partial inversion (varied; inconsistent) Laryngeal vestibule closure: Complete, no air/contrast in laryngeal vestibule Pharyngeal stripping wave : Present - diminished Pharyngeal contraction (A/P view only): Incomplete (pseudoviderticulae) Pharyngoesophageal segment opening: Partial distention/partial duration, partial obstruction of flow Tongue base retraction: Trace column of contrast or air between tongue base and PPW Pharyngeal residue: Collection of residue within or on pharyngeal structures Location of pharyngeal residue: Pyriform sinuses; Pharyngeal wall  Esophageal Impairment Domain: Esophageal Impairment Domain Esophageal clearance upright position: Esophageal retention with retrograde flow through the PES Pill: No data recorded Penetration/Aspiration Scale Score: Penetration/Aspiration Scale Score 1.  Material does not enter airway: Thin liquids (Level 0); Mildly thick liquids (Level 2, nectar thick); Puree; Solid 7.  Material enters airway, passes BELOW cords and not ejected out despite cough attempt by patient: Thin liquids (Level 0); Mildly thick liquids (Level 2, nectar thick) Compensatory Strategies: Compensatory Strategies Compensatory strategies: Yes Liquid wash: -- (partially effective for reducing residue in upper esophagus)   General Information: Caregiver present: No (follow up completed at bedside post study)  Diet Prior to this Study: Dysphagia 3 (mechanical soft); Thin liquids (Level 0)   Temperature : Normal   Respiratory Status: WFL   Supplemental O2: None (Room air)   History of Recent Intubation: No  Behavior/Cognition: Alert; Cooperative Self-Feeding Abilities: Able to self-feed Baseline  vocal quality/speech: Normal No data recorded Volitional Swallow: Able to elicit Exam Limitations: No limitations Goal Planning: Prognosis for improved oropharyngeal function: Good Barriers to Reach Goals: Language deficits No data recorded Patient/Family Stated Goal: improve communication; hopeful to get back to his baseline Consulted and agree with results and recommendations: Patient; Family member/caregiver; Physician Pain: Pain Assessment Pain Assessment: No/denies pain Faces Pain Scale: 2 Pain Location: L shoulder with mobility Pain Descriptors / Indicators: Discomfort; Aching Pain Intervention(s): Limited activity within patient's tolerance; Monitored during session; Other (comment) (ROM for stiffness) End of Session: Start Time:SLP Start Time (ACUTE ONLY): 0830 Stop Time: SLP Stop Time (ACUTE ONLY): 9141 Time Calculation:SLP Time Calculation (min) (ACUTE ONLY): 28 min Charges: SLP Evaluations $ SLP Speech Visit: 1 Visit SLP Evaluations $MBS Swallow: 1 Procedure $Speech Treatment for Individual: 1 Procedure SLP visit diagnosis: SLP Visit Diagnosis: Aphasia (R47.01); Cognitive communication deficit (R41.841); Dysphagia, pharyngoesophageal phase (R13.14) Past Medical History: Past Medical History: Diagnosis Date  Allergy   Complication of anesthesia   pt reports he is starting to have more difficulty  arousing after surgery  Diabetes mellitus (HCC)   GERD (gastroesophageal reflux disease)   Headache   History of kidney stones   Hyperlipidemia   Renal disorder   kidney stones Past Surgical History: Past Surgical History: Procedure Laterality Date  APPLICATION OF CRANIAL NAVIGATION Right 01/17/2021  Procedure: APPLICATION OF CRANIAL NAVIGATION;  Surgeon: Debby Dorn MATSU, MD;  Location: [facility] OR;  Service: Neurosurgery;  Laterality: Right;  COLONOSCOPY  09/03/1994  COLONOSCOPY WITH PROPOFOL  N/A 01/07/2023  Procedure: COLONOSCOPY WITH PROPOFOL ;  Surgeon: Jinny Carmine, MD;  Location: [facility] ENDOSCOPY;  Service:  Endoscopy;  Laterality: N/A;  NOT TOO EARLY  CYSTOSCOPY WITH STENT PLACEMENT Right 01/25/2016  Procedure: CYSTOSCOPY WITH STENT PLACEMENT;  Surgeon: Redell Lynwood Napoleon, MD;  Location: [facility] ORS;  Service: Urology;  Laterality: Right;  FRAMELESS  BIOPSY WITH BRAINLAB Right 01/17/2021  Procedure: RIGHT STEREOTACTIC BIOPSY OF INSULAR LESION;  Surgeon: Debby Dorn MATSU, MD;  Location: [facility] OR;  Service: Neurosurgery;  Laterality: Right;  KNEE ARTHROSCOPY W/ MENISCAL REPAIR Right 06/23/1988  POLYPECTOMY  01/07/2023  Procedure: POLYPECTOMY;  Surgeon: Jinny Carmine, MD;  Location: [facility] ENDOSCOPY;  Service: Endoscopy;;  removal of birthmark  06/08/2013  SKIN CANCER EXCISION  06/23/2012  TYMPANOSTOMY TUBE PLACEMENT  08/06/1978  URETEROSCOPY WITH HOLMIUM LASER LITHOTRIPSY Right 01/25/2016  Procedure: URETEROSCOPY WITH HOLMIUM LASER LITHOTRIPSY;  Surgeon: Redell Lynwood Napoleon, MD;  Location: [facility];  Service: Urology;  Laterality: Right;  URETHRAL STRICTURE DILATATION    visual inspection of vocal cord    1973  WISDOM TOOTH EXTRACTION   [name] 12/24/2023, 12:12 PM  MR BRAIN WO CONTRAST Result Date: 12/23/2023 CLINICAL DATA:  53 year old male with punctate right temporal lobe infarct last month. Neurologic deficit, right basal ganglia infarct on 12/18/2023. EXAM: MRI HEAD WITHOUT CONTRAST TECHNIQUE: Multiplanar, multiecho pulse sequences of the brain and surrounding structures were obtained without intravenous contrast. COMPARISON:  Brain MRI 12/18/2023 and earlier. FINDINGS: Brain: Progressive restricted diffusion now beginning anterior to the right caudate and continuing through the nearby right ACA territory, confluent involvement along a 4-5 cm segment of the right cingulate (series 3, image 40). Unresolved diffusion restriction previously seen along the right frontal horn, and also in the posterior right corona radiata tracking toward the right lentiform, and also lateral to the atrium of the right lateral  ventricle. Also punctate new restricted diffusion in the left lentiform series 3, image 30. T2 and FLAIR hyperintense cytotoxic edema in the affected areas. Petechial hemorrhage on T2* imaging not significantly changed from previous SWI Heidelberg classification 1b: HI2, confluent petechiae, no mass effect. Pre-existing cystic encephalomalacia and hemosiderin along the anterior right MCA division also. Stable T2 and FLAIR hyperintense probable gliosis of the right insula there. Stable encephalomalacia with T2 shine through along the anterior the left genu of the corpus callosum. Chronic brainstem Wallerian degeneration, greater on the right. No midline shift, mass effect, evidence of mass lesion, acute ventriculomegaly, extra-axial collection. Cervicomedullary junction and pituitary are within normal limits. Vascular: Major intracranial vascular flow voids are stable. Skull and upper cervical spine: Negative. Visualized bone marrow signal is within normal limits. Sinuses/Orbits: Stable paranasal sinus mucosal thickening and opacification. Negative orbits. Other: Mastoids well aerated. IMPRESSION: 1. New acute and confluent mid and distal Right ACA territory infarction. Punctate new left basal ganglia acute lacunar infarct. And ongoing multifocal right MCA deep white and deep gray matter ischemia otherwise not significantly changed from 12/18/2023. 2. Associated fatigue no hemorrhage. No malignant hemorrhagic transformation. No intracranial mass effect or midline  shift. 3. Underlying chronic ischemic disease and encephalomalacia. Electronically Signed   By: VEAR Hurst M.D.   On: 12/23/2023 13:02   Overnight EEG with video Result Date: 12/23/2023 [name], MD     12/23/2023 12:49 PM Patient Name: DAELYN MOZER MRN: [id number] Epilepsy Attending: [name] Shelton Referring Physician/Provider: Judithe Rocky BROCKS, NP Duration: 12/22/2023 2256 to 12/23/2023 9073 Patient history: 53 year old male with altered mental  status.  Today had left gaze preference, left hemianopia and facial droop with waxing and waning aphasia and weakness.  EEG to evaluate for seizure. Level of alertness: Awake, asleep AEDs during EEG study: LTG Technical aspects: This EEG study was done with scalp electrodes positioned according to the 10-20 International system of electrode placement. Electrical activity was reviewed with band pass filter of 1-70Hz , sensitivity of 7 uV/mm, display speed of 36mm/sec with a 60Hz  notched filter applied as appropriate. EEG data were recorded continuously and digitally stored.  Video monitoring was available and reviewed as appropriate. Description: The posterior dominant rhythm consists of 8-9 Hz activity of moderate voltage (25-35 uV) seen predominantly in posterior head regions, symmetric and reactive to eye opening and eye closing. Sleep was characterized by vertex waves, sleep spindles (12 to 14 Hz), maximal frontocentral region. EEG showed continuous 3 to 6 Hz theta-delta slowing in right frontotemporal region. Hyperventilation and photic stimulation were not performed.   ABNORMALITY - Continuous slow, right frontotemporal region IMPRESSION: This study is suggestive of cortical dysfunction arising from right frontotemporal region likely secondary to underlying strokes, postictal state. No seizures or epileptiform discharges were seen throughout the recording. [name] Shelton   MR ANGIO HEAD WO CONTRAST Result Date: 12/23/2023 CLINICAL DATA:  Neuro deficit, acute, stroke suspected EXAM: MRA HEAD WITHOUT CONTRAST TECHNIQUE: Angiographic images of the Circle of Willis were acquired using MRA technique without intravenous contrast. COMPARISON:  CT head from earlier today. FINDINGS: Anterior circulation: Hypoplastic left A1 ACA. Otherwise, bilateral intracranial ICAs, MCAs, and ACAs are patent. 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 Posterior circulation: Bilateral intradural vertebral arteries, basilar artery and  bilateral posterior cerebral arteries are patent without proximal hemodynamically significant stenosis. Bilateral fetal type PCAs, anatomic variant. IMPRESSION: 1. No emergent large vessel occlusion. 2. 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 Electronically Signed   By: Gilmore GORMAN Molt M.D.   On: 12/23/2023 00:48   CT HEAD WO CONTRAST ( ) Result Date: 12/22/2023 CLINICAL DATA:  Stroke, follow up EXAM: CT HEAD WITHOUT CONTRAST TECHNIQUE: Contiguous axial images were obtained from the base of the skull through the vertex without intravenous contrast. RADIATION DOSE REDUCTION: This exam was performed according to the departmental dose-optimization program which includes automated exposure control, adjustment of the mA and/or kV according to patient size and/or use of iterative reconstruction technique. COMPARISON:  CT head December 18, 2023. FINDINGS: Brain: Stable areas of dystrophic calcification encephalomalacia in the regions of prior treated malignancy including right frontal lobe and right periatrial region. Known infarcts were better characterized on recent MRI, but do not appear substantially changed when comparing across modalities. No progressive mass effect or acute hemorrhage. Vascular: No hyperdense vessel identified. Skull: No acute fracture. Sinuses/Orbits: Ethmoid air cell and frontal sinus mucosal thickening. No acute orbital findings. Other: No mastoid effusions. IMPRESSION: 1. Known infarcts were better characterized on recent MRI, but do not appear substantially changed when comparing across modalities. No progressive mass effect or acute hemorrhage. 2. Similar appearance of treated tumor with dystrophic calcification. Electronically Signed   By: Gilmore  GORMAN Molt M.D.   On: 12/22/2023 20:13   DG Shoulder Left Result Date: 12/19/2023 CLINICAL DATA:  [id number] Pain [id number] EXAM: LEFT SHOULDER - 2+ VIEW COMPARISON:  April 11, 2023 FINDINGS: No acute fracture or dislocation. There is no evidence of  arthropathy or other focal bone abnormality. Soft tissues are unremarkable. IMPRESSION: No acute fracture or dislocation. Electronically Signed   By: Rogelia Myers M.D.   On: 12/19/2023 19:21   EEG adult Result Date: 12/19/2023 [name], MD     12/19/2023  4:51 PM Patient Name: BECKETT MADEN MRN: [id number] Epilepsy Attending: [name] Shelton Referring Physician/Provider: Noralee Elidia Sieving, MD Date: 12/19/2023 Duration: 27.36 mins Patient history: 53 y.o. 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 EEG to evaluate for seizure Level of alertness: Awake, asleep AEDs during EEG study: LTG Technical aspects: This EEG study was done with scalp electrodes positioned according to the 10-20 International system of electrode placement. Electrical activity was reviewed with band pass filter of 1-70Hz , sensitivity of 7 uV/mm, display speed of 66mm/sec with a 60Hz  notched filter applied as appropriate. EEG data were recorded continuously and digitally stored.  Video monitoring was available and reviewed as appropriate. Description: The posterior dominant rhythm consists of 8 Hz activity of moderate voltage (25-35 uV) seen predominantly in posterior head regions, symmetric and reactive to eye opening and eye closing. Sleep was characterized by vertex waves, sleep spindles (12 to 14 Hz), maximal frontocentral region. EEG showed continuous 3 to 6 Hz theta-delta slowing in right hemisphere, maximal right fronto-temporal region. Hyperventilation and photic stimulation were not performed.   ABNORMALITY - Continuous slow, right hemisphere, maximal right fronto-temporal region IMPRESSION: This study is suggestive of cortical dysfunction arising from right hemisphere, maximal right fronto-temporal region likely secondary to underlying structural abnormality. No seizures or definite epileptiform discharges were seen throughout the recording.  [name] Shelton   MR BRAIN WO CONTRAST Result Date: 12/18/2023 CLINICAL DATA:  Provided history: History of stroke, acute worsening of chronic left-sided weakness. Additional history obtained from prior radiology records: History of oligodendroglioma. EXAM: MRI HEAD WITHOUT CONTRAST TECHNIQUE: Multiplanar, multiecho pulse sequences of the brain and surrounding structures were obtained without intravenous contrast. COMPARISON:  Non-contrast head CT performed earlier today 12/18/2023. Non-contrast head CT and CT angiogram head/neck 12/09/2023. Prior brain MRI examinations 12/09/2023 and earlier. FINDINGS: Brain: Stable cerebral volume. New from the recent prior brain MRI 12/09/2023, there is a focus of patchy restricted diffusion within the right basal ganglia measuring 3.2 x 1.3 cm. There is also a new punctate focus of restricted diffusion within the right caudate head (series 2, image 25). These foci are favored to reflect acute infarcts. The small focus of restricted diffusion demonstrated within the superior right temporal lobe on the prior brain MRI is no longer present. Otherwise stable signal abnormality within the right frontal lobe/insula, extending to the right temporal lobe, region of the right deep gray nuclei, right periatrial white matter, anterior body/genu of corpus callosum and slightly into the adjacent anterior left frontal lobe white matter. Superimposed patchy and irregular foci of diffusion-weighted signal abnormality at these sites are otherwise unchanged and these findings are consistent with a combination of tumor and post-treatment changes. Associated foci of dystrophic calcification and likely chronic hemosiderin deposition. Unchanged focus of chronic encephalomalacia/gliosis within the anterolateral right frontal lobe/insula. Redemonstrated tract (with associated chronic hemosiderin deposition) in the right frontal lobe, presumably from prior biopsy. Ex vacuo dilatation  of the right  lateral ventricle. Unchanged chronic T2 FLAIR hyperintense signal changes within the pons, nonspecific No extra-axial fluid collection. No midline shift. Vascular: Maintained flow voids within the proximal large arterial vessels. Skull and upper cervical spine: No focal worrisome marrow lesion. Sinuses/Orbits: No mass or acute finding within the imaged orbits. Mucosal thickening within the bilateral frontal sinuses (mild right, moderate left). Severe bilateral ethmoid sinusitis. Mild mucosal thickening within the bilateral maxillary sinuses. Other: Trace fluid within left mastoid air cells. IMPRESSION: 1. 3.2 x 1.3 cm focus of patchy restricted diffusion within the right basal ganglia, new from the recent prior brain MRI of 12/09/2023. New punctate focus of restricted diffusion within the right caudate head. Given the appearance and rapid development, these findings are strongly favored to reflect acute infarcts. However, a short-interval follow-up brain MRI (in 6 weeks) is recommended to exclude any tumor progression. 2. A small focus of restricted diffusion demonstrated within the right temporal lobe on the prior MRI is no longer present. This likely reflected an acute infarct on the prior exam. 3. Otherwise stable appearance of the patient's known tumor and post-treatment changes. 4. Paranasal sinus disease as described. Electronically Signed   By: Rockey Childs D.O.   On: 12/18/2023 16:47   CT HEAD WO CONTRAST Result Date: 12/18/2023 CLINICAL DATA:  Neuro deficit, acute, stroke suspected. History of oligodendroglioma status post treatment. EXAM: CT HEAD WITHOUT CONTRAST TECHNIQUE: Contiguous axial images were obtained from the base of the skull through the vertex without intravenous contrast. RADIATION DOSE REDUCTION: This exam was performed according to the departmental dose-optimization program which includes automated exposure control, adjustment of the mA and/or kV according to patient size and/or use of  iterative reconstruction technique. COMPARISON:  CT of the head dated December 09, 2023. FINDINGS: Brain: Moderate generalized is cerebral volume loss. Curvilinear areas of calcification are again demonstrated within the periventricular and subcortical white matter of the right frontal lobe. Dystrophic calcifications are also seen anteromedial to the atrium of the right lateral ventricle. There are encephalomalacia changes and ex vacuo dilatation of the right lateral ventricle, which appears similar to the prior exam. There is no evidence of acute hemorrhage or infarction. Vascular: Moderate calcific atheromatous disease within the carotid siphons and right vertebral artery. Skull: Right frontal burr craniotomy defects. The calvaria is otherwise intact. No osseous lesions present. Sinuses/Orbits: Moderate mucosal disease within the frontal, ethmoid and maxillary sinuses. The orbits are unremarkable. Other: None. IMPRESSION: 1. Stable encephalomalacia changes and dystrophic calcification within the right cerebral hemisphere, compatible with previously treated neoplasm. No adverse interval change. 2. Mild-to-moderate paranasal sinus disease. Electronically Signed   By: [name] M.D.   On: 12/18/2023 15:37    Labs:  Basic Metabolic Panel: Recent Labs  Lab 01/06/24 0434 01/07/24 0443 01/08/24 0938 01/11/24 0522  NA 139 139 139 138  K 3.4* 3.9 4.4 4.0  CL 99 98 103 104  CO2 29 30 28 24   GLUCOSE 134* 113* 124* 167*  BUN 15 16 15 14   CREATININE 1.08 1.17 1.13 0.76  CALCIUM  9.9 10.3 10.1 9.3    CBC: Recent Labs  Lab 01/07/24 0443 01/11/24 0522  WBC 10.6* 9.9  HGB 17.0 13.8  HCT 50.4 39.9  MCV 86.2 84.9  PLT 275 211    CBG: Recent Labs  Lab 01/07/24 2105 01/08/24 0644  GLUCAP 147* 125*    Brief HPI:   Alan Wise is a 53 y.o. male ***   Hospital Course: Trusten Hume  Zehner was admitted to rehab 12/24/2023 for inpatient therapies to consist of PT, ST and OT at least three  hours five days a week. Past admission physiatrist, therapy team and rehab RN have worked together to provide customized collaborative inpatient rehab.   Blood pressures were monitored on TID basis and   Diabetes has been monitored with ac/hs CBG checks and SSI was use prn for tighter BS control.    Rehab course: During patient's stay in rehab weekly team conferences were held to monitor patient's progress, set goals and discuss barriers to discharge. At admission, patient required  He  has had improvement in activity tolerance, balance, postural control as well as ability to compensate for deficits.       Discharge disposition: 06-Home-Health Care Svc        Diet:  Special Instructions:  Discharge Instructions     Ambulatory referral to Physical Medicine Rehab   Complete by: As directed    Moderate complexity follow-up 1 to 2 weeks right ACA infarction      Allergies as of 01/11/2024       Reactions   Contrast Media [iodinated Contrast Media] Swelling   Keppra  [levetiracetam ]    Agitated and mean        Medication List     STOP taking these medications    ARIPiprazole  5 MG tablet Commonly known as: Abilify    aspirin  EC 81 MG tablet       TAKE these medications    acetaminophen  325 MG tablet Commonly known as: TYLENOL  Take 1-2 tablets (325-650 mg total) by mouth every 4 (four) hours as needed for mild pain (pain score 1-3).   amLODipine  5 MG tablet Commonly known as: NORVASC  Take 1 tablet (5 mg total) by mouth daily.   apixaban  5 MG Tabs tablet Commonly known as: ELIQUIS  Take 1 tablet (5 mg total) by mouth 2 (two) times daily.   atorvastatin  40 MG tablet Commonly known as: LIPITOR Take 1 tablet (40 mg total) by mouth daily with supper.   dexamethasone  1 MG tablet Commonly known as: DECADRON  Take 2 tablets (2 mg total) by mouth daily for 6 days, THEN 1 tablet (1 mg total) daily for 7 days, THEN 0.5 tablets (0.5 mg total) daily for 7  days. Start taking on: January 12, 2024   feeding supplement Liqd Take 237 mLs by mouth 3 (three) times daily between meals.   lamoTRIgine  150 MG tablet Commonly known as: LAMICTAL  Take 1 tablet (150 mg total) by mouth 2 (two) times daily.   megestrol  400 MG/10ML suspension Commonly known as: MEGACE  Take 10 mLs (400 mg total) by mouth 2 (two) times daily. Take 30 minutes before lunch and before supper   metoCLOPramide  5 MG tablet Commonly known as: REGLAN  Take 1 tablet (5 mg total) by mouth 3 (three) times daily before meals. Notes to patient: To help with nausea   multivitamin with minerals Tabs tablet Take 1 tablet by mouth daily. Start taking on: January 12, 2024   ondansetron  8 MG disintegrating tablet Commonly known as: ZOFRAN -ODT Take 1 tablet (8 mg total) by mouth every 8 (eight) hours. Notes to patient: To help with nausea--take before meals   pantoprazole  40 MG tablet Commonly known as: PROTONIX  Take 1 tablet (40 mg total) by mouth 2 (two) times daily before a meal.   polyethylene glycol powder 17 GM/SCOOP powder Commonly known as: GLYCOLAX /MIRALAX  Take 1 capful (17 g) by mouth daily. Start taking on: January 12, 2024   prochlorperazine   5 MG tablet Commonly known as: COMPAZINE  Take 1 tablet (5 mg total) by mouth every 8 (eight) hours as needed for refractory nausea / vomiting. Notes to patient: Take 1/2 tablet and if ineffective can take the other  1/2 after 15- 20 minutes.    propranolol  20 MG tablet Commonly known as: INDERAL  Take 1 tablet (20 mg total) by mouth 2 (two) times daily.   senna-docusate 8.6-50 MG tablet Commonly known as: Senokot-S Take 2 tablets by mouth daily with supper. Notes to patient: For constipationi.         Follow-up Information     Antonette Angeline ORN, NP Follow up.   Specialties: Internal Medicine, Emergency Medicine Why: Call in 1-2 days for post hospital follow up Contact information: [address] [place]  [address] [phone]         Emeline Search C, DO Follow up.   Specialty: Physical Medicine and Rehabilitation Why: office will call you with follow up appointment Contact information: [address] [place] [address] [phone]         Buckley Arthea POUR, MD Follow up.   Specialties: Psychiatry, Neurology, Oncology Why: call for appointment Contact information: [address] [place] [address] [phone]                 Signed: Sharlet GORMAN Schmitz 01/11/2024, 10:40 AM

## 2024-01-11 NOTE — Telephone Encounter (Signed)
 SW received an update from [facility] reporting the branch has now said they have a delay on SLP. When asking how long they were unsure, and suggested another agency that can better assist.   HHPT/OT/SLP/aide referral accepted by [facility].   Graeme Jude, MSW, LCSW Office: [phone] Cell: [phone] Fax: [phone]

## 2024-01-11 NOTE — Progress Notes (Signed)
 Patient ID: Alan Wise, male   DOB: 1970-11-04, 53 y.o.   MRN: [id number]  HHPT/OT/SLP/aide- [facility].    Graeme Jude, MSW, LCSW Office: [phone] Cell: [phone] Fax: [phone]

## 2024-01-11 NOTE — Progress Notes (Signed)
 Inpatient Rehabilitation Discharge Medication Review by a Pharmacist  A complete drug regimen review was completed for this patient to identify any potential clinically significant medication issues.  High Risk Drug Classes Is patient taking? Indication by Medication  Antipsychotic Yes PRN Prochlorperazine  - refractory nausea/vomiting  Anticoagulant Yes Apixaban  - atrial fibrillation, CVA prophylaxis  Antibiotic No   Opioid No   Antiplatelet No   Hypoglycemics/insulin  No   Vasoactive Medication Yes Amlodipine , propranolol  - hypertension  Chemotherapy No   Other Yes Atorvastatin  - hyperlipidemia Lamotrigine  - seizure prophylaxis Megestrol  suspension, dexamethasone  - appetite  Metoclopramide , ondansetron  - nausea Multivitamin - supplement Pantoprazole  - GI prophylaxis Miralax , senna-docusate - laxatives  PRNs: Acetaminophen  - mild pain     Type of Medication Issue Identified Description of Issue Recommendation(s)  Drug Interaction(s) (clinically significant)     Duplicate Therapy     Allergy     No Medication Administration End Date     Incorrect Dose     Additional Drug Therapy Needed     Significant med changes from prior encounter (inform family/care partners about these prior to discharge). Lamotrigine  bedtime dose was increased during inpatient stay, but back to prior regimen on 7/16.  Prior amlodipine  and propranolol  continued.  All other meds are new.  Staying off Aspirin  81 mg and Abilify  (reported not taking either PTA)    Communicate changes with patient/family prior to discharge.   Other       Clinically significant medication issues were identified that warrant physician communication and completion of prescribed/recommended actions by midnight of the next day:  No  Pharmacist comments:  - Dexamethasone  dose tapering weekly then stopping   Time spent performing this drug regimen review (minutes):  20  Alan Wise, [place] 01/11/2024 11:58  AM

## 2024-01-11 NOTE — Progress Notes (Signed)
 PROGRESS NOTE   Subjective/Complaints:  No events overnight.  No nausea or emesis reported over the weekend.  Patient denies any symptoms, appears to be eating 100% of his breakfast.  Feeling good about discharge. Vitals stable     01/11/2024    5:52 AM 01/10/2024    7:44 PM 01/10/2024    3:16 PM  Vitals with BMI  Systolic 132 137 862  Diastolic 81 70 73  Pulse 65 73 69    P.o. intakes improved, eating 75 to 100% of meals 1-2 times daily. Continent of bladder  Last BM 7/12 per record; has been reporting regular bowel movements.  Discussed with patient, no abdominal pain, has been passing flatus and exam is reassuring; also, had KUB and CT abdomen late last week without significant stool burden.  He feels comfortable going home today without a bowel movement.  ROS: limited d/t aphasia  +Nausea-resolved  Objective:   No results found.    Recent Labs    01/11/24 0522  WBC 9.9  HGB 13.8  HCT 39.9  PLT 211     Recent Labs    01/08/24 0938 01/11/24 0522  NA 139 138  K 4.4 4.0  CL 103 104  CO2 28 24  GLUCOSE 124* 167*  BUN 15 14  CREATININE 1.13 0.76  CALCIUM  10.1 9.3     Intake/Output Summary (Last 24 hours) at 01/11/2024 0743 Last data filed at 01/10/2024 1841 Gross per 24 hour  Intake 480 ml  Output --  Net 480 ml         Physical Exam: Vital Signs Blood pressure 132/81, pulse 65, temperature 98.6 F (37 C), resp. rate 18, height 5' 8.5 (1.74 m), weight 59.6 kg, SpO2 97%.  Constitutional: No distress . Vital signs reviewed.  Sitting up in chair, eating breakfast.  HEENT: NCAT, EOMI, oral membranes moist.  Reported visual field deficits, however not apparent on gross testing. Neck: supple; anterior tilt to head. Cardiovascular: RRR without murmur. No JVD    Respiratory/Chest: CTA Bilaterally without wheezes or rales. Normal effort    GI/Abdomen: BS +, nonTTP, non-distended, soft. Ext: no  clubbing, cyanosis, or edema Psych: More happy/dynamic, calm/cooperative  Neuro:  Awake, alert.   Global aphasia, expressive greater than receptive.  Answers yes/no questions intermittently appropriately, perseverative speech (that which is for most questions); starting to get some intact and appropriate short words and phrases intermittently, but is complicated by fatigue or frustration. L hemineglect --can attend to left side with verbal stimulation--improved from admission Strength: Right upper extremity and right lower extremity 5 out of 5 throughout Left upper extremity 4-5 shoulder abduction, 4-5 biceps, triceps, 2-5 wrist extension, 4-5 grip, 0-5 finger abduction Cranial nerves: Left facial droop, left tongue weakness, left shoulder droop persistent Tone: No appreciable tone in left upper extremity; flexor synergy pattern--when able to relax, tone subsides--unchanged Decreased sensation to light touch throughout the left hemibody--unchanged   Assessment/Plan: 1. Functional deficits which require 3+ hours per day of interdisciplinary therapy in a comprehensive inpatient rehab setting. Physiatrist is providing close team supervision and 24 hour management of active medical problems listed below. Physiatrist and rehab team continue to assess barriers to  discharge/monitor patient progress toward functional and medical goals  Care Tool:  Bathing    Body parts bathed by patient: Right arm, Left arm, Chest, Abdomen, Front perineal area, Right upper leg, Left upper leg, Right lower leg, Left lower leg, Face, Buttocks   Body parts bathed by helper: Buttocks, Right arm     Bathing assist Assist Level: Supervision/Verbal cueing     Upper Body Dressing/Undressing Upper body dressing   What is the patient wearing?: Pull over shirt    Upper body assist Assist Level: Supervision/Verbal cueing    Lower Body Dressing/Undressing Lower body dressing      What is the patient wearing?:  Underwear/pull up, Pants     Lower body assist Assist for lower body dressing: Supervision/Verbal cueing     Toileting Toileting    Toileting assist Assist for toileting: Contact Guard/Touching assist     Transfers Chair/bed transfer  Transfers assist     Chair/bed transfer assist level: Contact Guard/Touching assist     Locomotion Ambulation   Ambulation assist      Assist level: Supervision/Verbal cueing Assistive device: Walker-rolling Max distance: 400   Walk 10 feet activity   Assist     Assist level: Supervision/Verbal cueing Assistive device: Walker-rolling   Walk 50 feet activity   Assist    Assist level: Supervision/Verbal cueing Assistive device: Walker-rolling    Walk 150 feet activity   Assist    Assist level: Supervision/Verbal cueing Assistive device: Walker-rolling    Walk 10 feet on uneven surface  activity   Assist Walk 10 feet on uneven surfaces activity did not occur: Safety/medical concerns   Assist level: Supervision/Verbal cueing Assistive device: Development worker, international aid     Assist Is the patient using a wheelchair?: No   Wheelchair activity did not occur: N/A  Wheelchair assist level: Dependent - Patient 0% Max wheelchair distance: 200 ft    Wheelchair 50 feet with 2 turns activity    Assist    Wheelchair 50 feet with 2 turns activity did not occur: N/A   Assist Level: Dependent - Patient 0%   Wheelchair 150 feet activity     Assist  Wheelchair 150 feet activity did not occur: N/A   Assist Level: Dependent - Patient 0%   Blood pressure 132/81, pulse 65, temperature 98.6 F (37 C), resp. rate 18, height 5' 8.5 (1.74 m), weight 59.6 kg, SpO2 97%.  Medical Problem List and Plan: 1. 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  -7/4 fell last night- head CT looks the same  -7/8: SPV-CGA mobility this AM with OT; Min A for LUE integration. PT improving. SLP appears similar to last admission - per SLP was making progress in OP prior to this - very limited frustration tolerance.   -7/7: Episode of extreme lethargy with left upper extremity movements concerning for seizure after receiving 10 mg of IV Compazine .  EEG with cortical dysfunction from the right frontotemporal region, postictal state, no epileptiform discharges--was in place during another similar event.  CT head unchanged.  Neurology consulted/curbside, likely combination of postictal and medication sensitivity, increasing Lamictal  and reduce Compazine  to 5 mg dosing.  Patient monitored throughout the afternoon, improved. 7-9: Will complete family training remotely due to difficulty  coordinating family  7-10:Discussed case with Dr. Buckley; agree that patient is a challenging case with complex intracranial pathology, agree with MRI if nausea/dizziness does not improve in 2 to 3 days or if acute change in neurologic status.  Agree unlikely etiology of seizures.  Will treat with adjustment of antiemetics and start Decadron  1 mg for 3 days; keep low dose.   7/14--dc planned for tomorrow. Given his poor po intake and recent issues with nausea his progress has been slow. Spoke with his wife at length. I'm willing to keep him a bit longer to work on po intake and to improve his balance and safety before sending him home. See nutrition discussion below    7/15: Discussing with Dr. Buckley, ordering MRI brain without contrast given worsening left-sided weakness/inattention and ongoing recurrent emesis.  A.m. cortisol, TSH, T4, CMP ordered.  Considering bolus of high-dose steroids followed by 2 mg daily Decadron  for appetite stimulation--will wait until a.m. cortisol test to not confound results.  Dr. Buckley visiting with patient/family  today.    7-17: Tentatively extending length of stay through Monday for ongoing nausea, GI workup.  Extensive discussion of temporary feeding tube with family, they continue to wish to defer at this time.  Consults to hospitalist, gastroenterology as below.   7/18: D/w patient, agreeable to stay through Monday for monitorring of improved PO intake/nausea  The patient is medically ready for discharge to home and will need follow-up with [facility]. In addition, they will need to follow up with their PCP, Neuro-Oncology and Neurology.     2.  Antithrombotics: -DVT/anticoagulation:  Pharmaceutical: Eliquis  5mg  BID- cannot stop             -antiplatelet therapy: N/A  3. Pain Management: Tylenol  PRN.   7-10: No complaints of pain, headaches with nausea or emesis--remains consistent  7-17: Some complaints of abdominal pain today; not reproducible on exam, KUB negative.  Add heat pack as needed for belly pain.  - No complaints of pain over the weekend  4. Mood/Behavior/Sleep: LCSW to follow for evaluation and support.              -antipsychotic agents: N/A  -Melatonin PRN  -Reports last IRF admission mom encouraged him to stop his seizure medication. No issues this admission, will watch closely.   -7-10: Intermittent frustration but overall cooperative and participatory  5. Neuropsych/cognition: This patient is not fully capable of making decisions on his own behalf.  - Neuropsych attempted evaluation, unable to appreciably complete an assessment due to global aphasia and poor frustration tolerance  6. Skin/Wound Care: Routine pressure relief measure.    7. Fluids/Electrolytes/severe protein calorie malnutrition: monitor I/O. Routine labs. Cont supplements.   - 7-7 labs stable; placed on 100 mL/h's continuous IV fluids for 24 hours due to nausea/somnolence--DC'd 7-8 7-10: Labs stable.  Will give IV fluids today 75 cc/h given ongoing nausea/retching, poor p.o. intakes. P.o. intakes limited by  lack of interest in food, will consult nutrition for a.m. and consider appetite stimulant if antinausea medications ineffective.  See #18. 7/11: Nutrition consult completed, appreciate recommendations.  Notable patient has had 11% weight loss in the last 3 months.   7/14- still eating next to nothing.po intake recorded at 50cc yesterday   -BMET ok today, add on prealbumin   -begin trial of megace    -staff and family will need to push po   -?PEG   7/15: Megace  400 mg BID started yesterday; ate 25% lunch, 15% dinner.  Briefly discussed possibility of tube feeds with wife, may be agreeable to temporary nasogastric feeds but almost definitely patient would refuse PEG.  Wish to treat is much as possible with medications and workup first before considering tube feeds.  7/16: Steroids today for appetite per Dr. Buckley; patient wishes to limit medications so will hold off on adding olanzepine  7-17: Per wife, has been trying to eat more recently.  Wished to hold off on feeding tube at this time.   7-18: Ate 50% of dinner and 100% of breakfast.  See treatment of nausea as below, plan to monitor p.o. intake through this weekend and discharge Monday if improvement keeps up  -01/10/24 intake pretty good this weekend, above 50% for all documented meals.   -7-21: P.o. intakes much improved, seems emesis has resolved.  Continue current medications and discharge.  Will continue Decadron  2 mg for 1 week and then taper off slowly over an additional week for trial.  8. T2DM: Hgb A1c-5.4 and diet controlled.  -12/26/23 CBGs fine, not on SSI, no CBG checks ordered-- will tell nursing this is not needed.    9.  HTN: Monitor BP TID--avoid hypoperfusion. Continue Amlodipine  5 mg/day and propranolol  20 mg bid. 7/19-20 Blood pressure well-controlled on current regimen; some highs intermittently but resolve    Vitals:   01/07/24 1302 01/07/24 1950 01/08/24 0654 01/08/24 1355  BP: 133/81 128/70 (!) 144/77 116/64    01/08/24 2113 01/09/24 0630 01/09/24 1732 01/09/24 1942  BP: 138/86 113/67 132/64 123/66   01/10/24 0440 01/10/24 1516 01/10/24 1944 01/11/24 0552  BP: 122/65 137/73 137/70 132/81    10. Dysphagia: NO straws to decrease aspiration events.  --GI consult recommended as retention of solids contribute to aspiration. Per Dr. Lizabeth note this was discussed with GI and who felt patient to be too high risk for GI procedures and recommended follow up after d/c. -SLP following --no complaints or concerns for aspiration this admission 7-15: Noticed pocketing with therapies today, new behavior.  SLP informed and to evaluate this afternoon--pt refused diet downgrade  7/16: d/w SLP repeat swallow eval. GI consult prior reviewed; did not feel pt's dysfuctnion was amenable to scoping intervention at that time.   7-17: Dysphagia 3 diet per SLP; GI assessment as below--no plan for EGD at this time  7-21: Has had some notable food residue in his mouth per therapy notes; is responsive to cues for drinking liquids and clearing.  Will need close monitoring of this as an outpatient given risk of aspiration.  Follow-up with SLP therapies.   11. Seizure d/o; On Lamictal  150 mg am and 200 mg pm-->educate on compliance.  7/4- pt so far is taking meds-  7-7: Lamictal  increased to 200 mg twice daily per neurology due to concern for postictal state as above--no reports of recurrent seizures   7/15: Recurrent episodes of emesis/nausea without post-event lethargy as seen prior, not improved with seizure medication adjustment--discussed with wife, low clinical suspicion for seizure etiology  7/16: See recs; reduce Lamictal  to 150 mg BID--has remained stable without signs of seizures--continue current regimen on discharge   12. Oligodendroglioma c/b radiation necrosis: Follow up with Dr. 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    - 7/15: MRI brain today; Dr. Buckley assisting as above. Appreciate his insight and recommendations-- no  obvious changes to explain current decline   13. Dyslipidemia: LDL-129 and HDL 27. On Lipitor 40 mg/day.   14. Frequency/Nocturia: Gets up at nights and pt/wife agreed to toileting at 11 pm  and 3 am to prevent attempts out of bed.  7/4- pt refused these scheduled get up times, but fell- asked nursing to try and get him up for nocturia Continent of bladder  15. Spasticity - concerned about starting Any spasticity meds due to risk of sedation/confusion - suggest to be assessed for Botulinum toxin for LUE.  7-7: Minimal tone in left upper extremity, and functional strength in limb; perceived pout tone is mostly flexor posturing with left inattention, which he can break with volitional relaxation.  Feel recovery would be stymied by use of Botox at this time, continue conservative management.    7/15: Ongoing flexor synergy which waxes/wanes with inattention; no appreciable spasticity on exam  16. L inattention/neglect-secondary to #1.  17. Bowels: no BM documented since 7/1 but pt states he's going daily, however history is limited d/t aphasia. Adamant that he had a BM yesterday. Asked nursing to please document.  -01/02/24 no BM documented in 3 days, will go back to scheduled miralax  daily -01/03/24 still no BM documented but pt very adamant he had a BM yesterday; will leave meds as is for now, but would monitor closely  - 7/14 had bm 7/12. Eating nothing though, wouldn't expect much  No BM since 7/12; add daily miralax .   - 7-18: P.o. intakes picking up, KUB yesterday without significant stool burden.  Monitor.  - Last documented bowel movement 7-12; patient endorsing these more frequently.  Abdominal exam nonfocal, good bowel sounds, no rigidity/tenderness, is passing flatus and with insignificant stool pattern on KUB/CT.  Feel comfortable sending patient home on current regimen.  18.  Nausea -recurrent, central vertigo versus gastric irritation from poor p.o. intake/high pill burden  7-7:  Compazine  reduce to 5 mg p.o./IV due to somnolence.-->  2.5 mg p.o. as needed, with benefit 7-8  7-10: Discussed case with Dr. Buckley as above; hold off on MRI for now, interventions as follows   Protonix  40 mg twice daily for GI protection   Scheduled Zofran  8 mg every 8 hours; will leave Compazine  as as needed   Decadron  1 mg daily for 3 days   Scopolamine  patch every 72 hours--will hold off on initiating until a.m., to monitor improvement with other measures   IV fluids normal saline 75 cc/h tonight as above   PT vestibular evaluation ordered--will hold off for today given severity of symptoms   7-11: Doing much better with measures as described above, is eating today.  No further dizziness.  Will DC IV fluids, if any issues over the weekend would start scopolamine  patch and consider MRI brain.  - 01/04/24 no complaints again this morning, monitor. See nutrition discussion above  - 7/15: emesis yesterday, nausea today, PO intakes remain poor. Possible high-dose steroids pending AM cortisol as above.   7/16: Start Decadron  2 mg daily after AM labs, start scopalamine patch, reduce lamictal  as above. Discussed with patient eating food prior to medications.   7/17: AM cortisol low; per hospitalist inaccurate due to incorrect timing of draw.  Would need 4-day washout of current steroids prior to attempting corticotropin stimulating test.  Other labs without obvious causal factors.  Hospitalist, GI assessing today for further recommendations. - DC scopolamine , meclizine  due to no appreciable benefit - Increase Compazine  back to 5 mg as needed - Consider adjunctive addition of Thorazine for appetite stimulation/nausea - KUB negative; discussing with GI possible CT abdomen and pelvis tonight  7-18: GI, hospitalist evaluated, both signed off.  Appreciate recommendations. - CT abdomen and  pelvis with small nonobstructing renal stone; otherwise negative - Per GI recommendations, starting Reglan  5 mg 3  times daily AC - Per hospitalist, consider Florinef 0.05 mg and hydrocortisone 5 mg 3 times daily if high clinical suspicion for adrenal insufficiency; discussed with patient, he refuses escalation but willing to continue current decadron  2 mg daily -01/09/24-21: No emesis on current regimen.  Continue steroids for 2 weeks as above.  Dr. Buckley informed, will follow-up in clinic if formal cosyntropin stimulation test is needed at least 4 days after steroids have stopped.  19.  AKI.  Creatinine 1.17 from 0.9.  likely due to poor p.o. intakes.  Give 1 L IV fluid tonight, repeat testing in AM.  - 7-18: BMP pending.  P.o. fluid intakes appear improving, will set 50 cc/h overnight for maintenance fluids; can likely DC in 1 to 2 days if p.o. intakes hold up -01/09/24 BMP yesterday with improved Cr 1.13, PO intake variable. Cont fluids for now. Reordered.  -01/10/24 documented PO fluids a little less yesterday, but suspect some might not be documented; leave off IVFs overnight and see what labs look like tomorrow.  7-21: BUN/creatinine has normalized.  LOS: 18 days A FACE TO FACE EVALUATION WAS PERFORMED  Joesph JAYSON Likes 01/11/2024, 7:43 AM

## 2024-01-11 NOTE — Progress Notes (Signed)
 Inpatient Rehabilitation Care Coordinator Discharge Note   Patient Details  Name: Alan Wise MRN: [id number] Date of Birth: 08/07/1970   Discharge location: D/c to home with his wife  Length of Stay: 17 days  Discharge activity level: CGA to Supervision  Home/community participation: Limited  Patient response un:Yzjouy Literacy - How often do you need to have someone help you when you read instructions, pamphlets, or other written material from your doctor or pharmacy?: Often  Patient response un:Dnrpjo Isolation - How often do you feel lonely or isolated from those around you?: Patient unable to respond  Services provided included: MD, RD, PT, OT, SLP, RN, CM, TR, Pharmacy, Neuropsych, SW  Financial Services:  Field seismologist Utilized: Private Insurance [employer]  Choices offered to/list presented to: paitent wife  Follow-up services arranged:  DME, Home Health, Patient/Family has no preference for HH/DME agencies Home Health Agency: [facility] for HHPT/OT/SLP/aide    DME : [employer] for RW and transport chair    Patient response to transportation need: Is the patient able to respond to transportation needs?: Yes In the past 12 months, has lack of transportation kept you from medical appointments or from getting medications?: No In the past 12 months, has lack of transportation kept you from meetings, work, or from getting things needed for daily living?: No   Patient/Family verbalized understanding of follow-up arrangements:  Yes  Individual responsible for coordination of the follow-up plan: contact pt wife Alan Wise  Confirmed correct DME delivered: Alan Wise 01/11/2024    Comments (or additional information):fam edu completed  Summary of Stay    Date/Time Discharge Planning CSW  01/05/24 1021 Pt will d/c to home with his wife. Fam edu attempted but behaviors; one-on-one with wife within each discpline and needs at home. SW  will confirm there are no barriers to discharge. AAC  12/29/23 1026 Pt will d/c to home with his wife. Fam edu Wed 9:30am-12pm. SW will confirm there are no barriers to discharge. AAC       Alan Wise [name]

## 2024-01-12 ENCOUNTER — Telehealth: Payer: Self-pay

## 2024-01-12 ENCOUNTER — Other Ambulatory Visit (HOSPITAL_COMMUNITY): Payer: Self-pay

## 2024-01-12 NOTE — Telephone Encounter (Signed)
 1357- SW called pt wife pt to inform on change to [facility] and provided office contact information for [facility]  Graeme Jude, MSW, LCSW Office: [phone] Cell: [phone] Fax: [phone]

## 2024-01-12 NOTE — Telephone Encounter (Signed)
 Transitional Care Call--Spoke with patients Spouse    Are you/is patient experiencing any problems since coming home?  No Are there any questions regarding any aspect of care? No Are there any questions regarding medications administration/dosing? No Are meds being taken as prescribed?  Yes Patient should review meds with caller to confirm. Meds confirmed Have there been any falls?  No Has Home Health been to the house and/or have they contacted you? Spouse reports that Home Health is going to contact to set-up in-home Rehabilitation If not, have you tried to contact them? No, Spouse reports that it has been arranged and waiting for call on day they are coming out to the home. Can we help you contact them?  No Are bowels and bladder emptying properly?  Voiding okay.  Patient is taking stool softener and no bowel movements to report at this time. Are there any unexpected incontinence issues?  No If applicable, is patient following bowel/bladder programs? Following discharge instructions regarding bowel health Any fevers, problems with breathing, unexpected pain? No fevers, breathing issues Are there any skin problems or new areas of breakdown? Nothing noticeable.  Spouse will assist with bath today and call if any changes to skin or new bruising is found. Has the patient/family member arranged specialty MD follow up (ie cardiology/neurology/renal/surgical/etc)?  Patient is following up with Provider on 02/05/24. Can we help arrange? No Does the patient need any other services or support that we can help arrange? No Are caregivers following through as expected in assisting the patient? Patients spouse is primary caregiver.       19.  Has the patient quit smoking, drinking alcohol, or using drugs as recommended? Yes  Appointment date/time:  02/02/24 at 2:00pm with Fidela.  Patient aware to arrive at 1:40 pm.  Patient aware to complete paperwork (mailed to patient) and bring to appointment. Spouse  has office phone number and advised to give us  a call with any questions or concerns.  Spouse verbalized understanding.

## 2024-01-12 NOTE — Progress Notes (Signed)
 Speech Language Pathology Discharge Summary  Patient Details  Name: Alan Wise MRN: [id number] Date of Birth: [birth year]  Date of Discharge from SLP service:January 11, 2024   Patient has met 5 of 5 long term goals.  Patient to discharge at overall Max level.  Reasons goals not met: n/a   Clinical Impression/Discharge Summary:  Pt made slight progress this stay, as evidenced by mastery of 5/5 LTGs. Severe to profound expressive and moderate receptive aphasia remain at this time. He requires cues to clear L buccal cavity and maintain slow rate, but is tolerating D3 textures/thin liquids w/ minA. Frustration tolerance and receptiveness to education/cueing will continue to limit his overall progress, however, recommend cont ST upon d/c to target remaining deficits, maximize pt communication, and maximize pt independence.   Care Partner:  Caregiver Able to Provide Assistance: Yes  Type of Caregiver Assistance: Cognitive;Physical  Recommendation:  24 hour supervision/assistance;Home Health SLP  Rationale for SLP Follow Up: Maximize functional communication;Maximize swallowing safety;Reduce caregiver burden   Equipment: n/a   Reasons for discharge: Discharged from hospital   Patient/Family Agrees with Progress Made and Goals Achieved: Yes    [name] 01/12/2024, 8:03 AM

## 2024-01-12 NOTE — Plan of Care (Signed)
  Problem: RH Comprehension Communication Goal: LTG Patient will comprehend basic/complex auditory (SLP) Description: LTG: Patient will comprehend basic/complex auditory information with cues (SLP). Outcome: Completed/Met   Problem: RH Expression Communication Goal: LTG Patient will express needs/wants via multi-modal(SLP) Description: LTG:  Patient will express needs/wants via multi-modal communication (gestures/written, etc) with cues (SLP) Outcome: Completed/Met Goal: LTG Patient will increase word finding of common (SLP) Description: LTG:  Patient will increase word finding of common objects/daily info/abstract thoughts with cues using compensatory strategies (SLP). Outcome: Completed/Met   Problem: RH Awareness Goal: LTG: Patient will demonstrate awareness during functional activites type of (SLP) Description: LTG: Patient will demonstrate awareness during functional activites type of (SLP) Outcome: Completed/Met   Problem: RH Swallowing Goal: LTG Patient will consume least restrictive diet using compensatory strategies with assistance (SLP) Description: LTG:  Patient will consume least restrictive diet using compensatory strategies with assistance (SLP) Outcome: Completed/Met

## 2024-01-13 ENCOUNTER — Telehealth: Payer: Self-pay

## 2024-01-13 NOTE — Telephone Encounter (Signed)
 SW received message from pt wife reporting she would like to now have outpatient therapy sinve pt is now home doing better, and eating as well. Also heard a few issues  form her mother abot her HHA and would prefer outpatient instead. Prefers [facility] for PT/OT/SLP.  [id number] called pt wife Tinnie to confirm message received, and will ax order. Encouraged to call on Friday as he referral will be there.   SW informed [facility] that they now are switching to outpatient instead.    Graeme Jude, MSW, LCSW Office: [phone] Cell: [phone] Fax: [phone]

## 2024-01-14 ENCOUNTER — Telehealth: Payer: Self-pay

## 2024-01-14 NOTE — Telephone Encounter (Signed)
 SW faxed outpatient PT/OT/SLP referral to [facility] (p:[phone]/f:[phone]).  Case closed to SW.   Graeme Jude, MSW, LCSW Office: [phone] Cell: [phone] Fax: [phone]

## 2024-01-20 ENCOUNTER — Ambulatory Visit: Attending: Physical Medicine and Rehabilitation | Admitting: Occupational Therapy

## 2024-01-20 DIAGNOSIS — R4701 Aphasia: Secondary | ICD-10-CM | POA: Diagnosis present

## 2024-01-20 DIAGNOSIS — I69318 Other symptoms and signs involving cognitive functions following cerebral infarction: Secondary | ICD-10-CM | POA: Diagnosis present

## 2024-01-20 DIAGNOSIS — R278 Other lack of coordination: Secondary | ICD-10-CM | POA: Diagnosis present

## 2024-01-20 DIAGNOSIS — R2681 Unsteadiness on feet: Secondary | ICD-10-CM | POA: Insufficient documentation

## 2024-01-20 DIAGNOSIS — R41840 Attention and concentration deficit: Secondary | ICD-10-CM | POA: Insufficient documentation

## 2024-01-20 DIAGNOSIS — R41842 Visuospatial deficit: Secondary | ICD-10-CM | POA: Diagnosis present

## 2024-01-20 NOTE — Therapy (Signed)
 OUTPATIENT OCCUPATIONAL THERAPY NEURO EVALUATION  Patient Name: Alan Wise MRN: [id number] DOB:07-22-1970, 53 y.o., male Today's Date: 01/20/2024  PCP: Alan Wise  REFERRING PROVIDER: Pegge Toribio PARAS, Wise  END OF SESSION:  OT End of Session - 01/20/24 1225     Visit Number 1    Number of Visits 16    Date for OT Re-Evaluation 03/22/24    Authorization Type [employer] 2025 OOPM MET    Authorization Time Period Covered 100% VL:MN Auth Not Reqd    OT Start Time 0800    OT Stop Time 0845    OT Time Calculation (min) 45 min    Activity Tolerance Patient tolerated treatment well    Behavior During Therapy Agitated;Impulsive          Past Medical History:  Diagnosis Date   Allergy    Complication of anesthesia    pt reports he is starting to have more difficulty arousing after surgery   Diabetes mellitus (HCC)    GERD (gastroesophageal reflux disease)    Headache    History of kidney stones    Hyperlipidemia    Renal disorder    kidney stones   Past Surgical History:  Procedure Laterality Date   APPLICATION OF CRANIAL NAVIGATION Right 01/17/2021   Procedure: APPLICATION OF CRANIAL NAVIGATION;  Surgeon: Alan Wise;  Location: [facility] [address] OR;  Service: Neurosurgery;  Laterality: Right;   COLONOSCOPY  09/03/1994   COLONOSCOPY WITH PROPOFOL  N/A 01/07/2023   Procedure: COLONOSCOPY WITH PROPOFOL ;  Surgeon: Alan Wise;  Location: [facility] ENDOSCOPY;  Service: Endoscopy;  Laterality: N/A;  NOT TOO EARLY   CYSTOSCOPY WITH STENT PLACEMENT Right 01/25/2016   Procedure: CYSTOSCOPY WITH STENT PLACEMENT;  Surgeon: Alan Wise;  Location: [facility] ORS;  Service: Urology;  Laterality: Right;   FRAMELESS  BIOPSY WITH BRAINLAB Right 01/17/2021   Procedure: RIGHT STEREOTACTIC BIOPSY OF INSULAR LESION;  Surgeon: Alan Wise;  Location[facility] OR;  Service: Neurosurgery;  Laterality: Right;   KNEE ARTHROSCOPY W/ MENISCAL REPAIR Right 06/23/1988    POLYPECTOMY  01/07/2023   Procedure: POLYPECTOMY;  Surgeon: Alan Wise;  Location: [facility] ENDOSCOPY;  Service: Endoscopy;;   removal of birthmark  06/08/2013   SKIN CANCER EXCISION  06/23/2012   TYMPANOSTOMY TUBE PLACEMENT  08/06/1978   URETEROSCOPY WITH HOLMIUM LASER LITHOTRIPSY Right 01/25/2016   Procedure: URETEROSCOPY WITH HOLMIUM LASER LITHOTRIPSY;  Surgeon: Alan Wise;  Location: [facility] ORS;  Service: Urology;  Laterality: Right;   URETHRAL STRICTURE DILATATION     visual inspection of vocal cord     1973   WISDOM TOOTH EXTRACTION     Patient Active Problem List   Diagnosis Date Noted   Nausea and vomiting 01/07/2024   Malnutrition of moderate degree 01/01/2024   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) 12/30/2023   Acute right ACA stroke (HCC) 12/24/2023   Acute CVA (cerebrovascular accident) (HCC) 12/18/2023   Psychomotor agitation 12/11/2023   Sepsis due to gram-negative UTI (HCC) 12/09/2023   Abnormal brain MRI 12/09/2023   Dyslipidemia 12/09/2023   Dysphagia 09/14/2023   Gait disorder 09/14/2023   Seizure disorder (HCC) 09/10/2023   History of CVA (cerebrovascular accident) 09/09/2023   Essential hypertension 09/09/2023   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   Type 2 diabetes mellitus with hyperlipidemia (HCC) 06/12/2014   Hyperlipidemia associated with type 2 diabetes mellitus (HCC) 06/12/2014  ONSET DATE: 01/14/2024  REFERRING DIAG: acute right ACA stroke  THERAPY DIAG:  Other lack of coordination  Visuospatial deficit  Aphasia  Attention and concentration deficit  Unsteadiness on feet  Other symptoms and signs involving cognitive functions following cerebral infarction  Rationale for Evaluation and Treatment: Rehabilitation  SUBJECTIVE:   SUBJECTIVE STATEMENT: Pt aphasic - verbal but unable to communicate what he wanted. Pt did  have occasional automatic appropriate responses.  Wife reports he had first stroke in March, possibly another smaller stroke, then this new stroke end of June with worsened symptoms. 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  Presented to the hospital 09/14/2023 with worsening gait left-sided weakness/aphasia and new onset dysphagia.  Hospitalized from 3-19 to 3-21 with CVA and breakthrough seizures.    PRECAUTIONS: Fall and Other: impulsive, decreased awareness, Lt visual field loss, seizures (recent one in beginning of July - 7th or 8th)   WEIGHT BEARING RESTRICTIONS: No  PAIN:  Are you having pain? No  FALLS: Has patient fallen in last 6 months? Yes. Number of falls 3  LIVING ENVIRONMENT: Lives with: lives with their family (wife, 59 y.o. dtr, 68 y.o. 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  HAND DOMINANCE: Left, but now using Rt hand  ADLs: Overall ADLs: Pt requires 24 hr supervison Transfers/ambulation related to ADLs: pt not using walker safety/correctly often lifting it Eating: mod I eating with Rt hand since March  (first stroke), wife cuts food  Grooming: mod I with Rt hand, wife trimming beard and/or shaving UB Dressing: independent LB Dressing: assist for Lt shoe and sock, and occasionally RT sock Toileting: independent Bathing: distant sup (wife reports she's not allowed in there - pt will not let her assist)  Tub Shower transfers: mod I  Equipment: none  IADLs: dep for all IADLS Community mobility: Has not driven since Nov 2024 d/t seizure, then more seizures, then first stroke Medication management: wife giving pt all medications Financial management: dep Handwriting: unable d/t weakness and aphasia but able to sign name only with assist from Lt hand  MOBILITY STATUS: using walker but often lifts it and does not use correctly   FUNCTIONAL OUTCOME MEASURES: TBA  UPPER EXTREMITY ROM:  RUE AROM WNLs LUE more limited at shoulder but distally WFLs   Active ROM Right eval Left eval  Shoulder flexion  90*  Shoulder abduction    Shoulder adduction    Shoulder extension    Shoulder internal rotation  WFL's  Shoulder external rotation  Approx 50%  Elbow flexion    Elbow extension    Wrist flexion    Wrist extension    Wrist ulnar deviation  Wrist radial deviation    Wrist pronation    Wrist supination    (Blank rows = not tested)    HAND FUNCTION: Grip strength: Right: 60.4 lbs; Left: 41.8 lbs  COORDINATION: Pt can pick up pen Lt hand but cannot manipulate (? Apraxia as well as decreased coordination) - pt has had decline in coordination since recent new stroke Box & Blocks: Lt = 9  SENSATION: Appears WFL's - unable to localize stimulation but ? If this is due to aphasia  EDEMA: none  MUSCLE TONE: LUE: Mild and Hypertonic  COGNITION: Overall cognitive status: Impaired and significantly impaired expressive aphasia, and moderate receptive aphasia. Pt easily frustrated trying to express himself. Pt also impulsive  VISION: Subjective report: per wife: vision has  decreased more, but impaired prior to first stroke Baseline vision: Wears glasses for reading only Visual history: changes in vision prior to CVA with necrosis from radiation  VISION ASSESSMENT: Impaired Visual Fields: Left homonymous hemianopsia  Decreased general awareness and attention also - especially to Lt side  PERCEPTION: Impaired: Inattention/neglect: does not attend to left visual field and does not attend to left side of body  PRAXIS: Impaired: Ideomotor  OBSERVATIONS: Lt side improving from first stroke, then recent stroke made it worse. Pt becomes very agitated/frustrated when he is unable to communicate what he wants to say and gets mad at spouse when she tries to speak (for clarification).                                                                                                                              TREATMENT DATE: N/A today      PATIENT EDUCATION: Education details: OT POC Person educated: Patient and Spouse Education method: Explanation Education comprehension: wife verbalized understanding  HOME EXERCISE PROGRAM: N/A   GOALS: Goals reviewed with patient? Yes   SHORT TERM GOALS: Target date: 02/20/24  Pt to be independent donning/doffing Lt shoe and both socks Baseline: Goal status: INITIAL  2.  Pt will be able to demo management of anger when he gets frustrated Baseline:  Goal status: INITIAL  3.  Pt/family to verbalize understanding of visual scanning strategies and pt able to recall at least 2 compensatory strategies for visual impairment without cueing Baseline:  Goal status: INITIAL  4.  Pt independent with HEP for LT shoulder ROM and Lt hand coordination Baseline:  Goal status: INITIAL  5.  Pt will improve LUE function as evidenced by performing 15 blocks on Box & Blocks test Baseline: 9 blocks Goal status: INITIAL   LONG TERM GOALS: Target date: 03/22/24  Pt will be independent with updated HEP  Baseline:  Goal status:  INITIAL  2.  Patient will return demonstration of visual adaptation use and verbalize understanding of how to obtain item(s) if desired.  Baseline:  Goal status: INITIAL  3.  Patient will demo improved FM coordination as evidenced by completing nine-hole peg with LUE use and assist  from RUE prn in 60 sec or under Baseline: unable Lt hand Goal status: INITIAL  4.  Pt with improve LUE function as evidenced by performing 20 or more blocks on Box & Blocks Baseline: 9 blocks Goal status: INITIAL  5.  Pt to write name Lt hand with 90% legibility Baseline:  Goal status: INITIAL  6.  Pt will perform environmental scanning safely with min cues for 75% accuracy Baseline:  Goal status: INITIAL  ASSESSMENT:  CLINICAL IMPRESSION: Patient is a 53 y.o. male who was seen today for occupational therapy evaluation for UE and visual deficits s/p recent new CVA on 12/18/23. 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 Patient currently presents below baseline level of function with L visual field awareness and LUE coordination affecting ADLS, safety and functional mobility.  Pt would benefit from skilled OT services in the outpatient setting to work on impairments as noted below to help pt return to PLOF as able.    PERFORMANCE DEFICITS: in functional skills including ADLs, IADLs, coordination, proprioception, tone, ROM, strength, Fine motor control, Gross motor control, mobility, balance, body mechanics, endurance, decreased knowledge of precautions, decreased knowledge of use of DME, vision, and UE functional use, cognitive skills including attention, perception, and safety awareness, and psychosocial skills including environmental adaptation.   IMPAIRMENTS: are limiting patient from ADLs, IADLs, rest and sleep, leisure, and social participation.   CO-MORBIDITIES: has  co-morbidities such as oligodendroglioma, seizures that affects occupational performance. Patient will benefit from skilled OT to address above impairments and improve overall function.  MODIFICATION OR ASSISTANCE TO COMPLETE EVALUATION: Min-Moderate modification of tasks or assist with assess necessary to complete an evaluation.  OT OCCUPATIONAL PROFILE AND HISTORY: Detailed assessment: Review of records and additional review of physical, cognitive, psychosocial history related to current functional performance.  CLINICAL DECISION MAKING: Moderate - several treatment options, min-mod task modification necessary  REHAB POTENTIAL: Fair decreased insight/awareness into deficits, decreased attention to Lt side, aphasia  EVALUATION COMPLEXITY: Moderate    PLAN:  OT FREQUENCY: 2x/week  OT DURATION: 8 weeks  PLANNED INTERVENTIONS: 97535 self care/ADL training, 02889 therapeutic exercise, 97530 therapeutic activity, 97112 neuromuscular re-education, 97140 manual therapy, 97010 moist heat, 97129 Cognitive training (first 15 min), 02869 Cognitive training(each additional 15 min), 02239 Orthotic Initial, 97763 Orthotic/Prosthetic subsequent, passive range of motion, balance training, functional mobility training, visual/perceptual remediation/compensation, energy conservation, coping strategies training, patient/family education, and DME and/or AE instructions  RECOMMENDED OTHER SERVICES: Pt scheduled for upcoming P.T. and SLP evaluations.   CONSULTED AND AGREED WITH PLAN OF CARE: Patient and family member/caregiver  PLAN FOR NEXT SESSION: review visual scanning strategies and reprint prn, initiate and/or update LUE HEP    Burnard Alan Roads, OT 01/20/2024, 12:27 PM

## 2024-01-21 ENCOUNTER — Encounter: Payer: Self-pay | Admitting: Speech Pathology

## 2024-01-21 ENCOUNTER — Ambulatory Visit: Admitting: Speech Pathology

## 2024-01-21 DIAGNOSIS — R4701 Aphasia: Secondary | ICD-10-CM

## 2024-01-21 DIAGNOSIS — R278 Other lack of coordination: Secondary | ICD-10-CM | POA: Diagnosis not present

## 2024-01-21 NOTE — Therapy (Signed)
 OUTPATIENT SPEECH LANGUAGE PATHOLOGY APHASIA EVALUATION   Patient Name: Alan Wise MRN: [id number] DOB:[birth year], 53 y.o., male Today's Date: 01/21/2024  PCP: Angeline Laura, NP REFERRING PROVIDER: Toribio Pitch, PA-C  END OF SESSION:  End of Session - 01/21/24 1019     Visit Number 1    Number of Visits 25    Date for SLP Re-Evaluation 04/14/24    Authorization Type [employer]    SLP Start Time 0800    SLP Stop Time  0845    SLP Time Calculation (min) 45 min    Activity Tolerance Patient tolerated treatment well          Past Medical History:  Diagnosis Date   Allergy    Complication of anesthesia    pt reports he is starting to have more difficulty arousing after surgery   Diabetes mellitus (HCC)    GERD (gastroesophageal reflux disease)    Headache    History of kidney stones    Hyperlipidemia    Renal disorder    kidney stones   Past Surgical History:  Procedure Laterality Date   APPLICATION OF CRANIAL NAVIGATION Right 01/17/2021   Procedure: APPLICATION OF CRANIAL NAVIGATION;  Surgeon: Debby Dorn MATSU, MD;  Location: [facility] OR;  Service: Neurosurgery;  Laterality: Right;   COLONOSCOPY  09/03/1994   COLONOSCOPY WITH PROPOFOL  N/A 01/07/2023   Procedure: COLONOSCOPY WITH PROPOFOL ;  Surgeon: Jinny Carmine, MD;  Location: [facility] ENDOSCOPY;  Service: Endoscopy;  Laterality: N/A;  NOT TOO EARLY   CYSTOSCOPY WITH STENT PLACEMENT Right 01/25/2016   Procedure: CYSTOSCOPY WITH STENT PLACEMENT;  Surgeon: Redell Lynwood Napoleon, MD;  Location: [facility] ORS;  Service: Urology;  Laterality: Right;   FRAMELESS  BIOPSY WITH BRAINLAB Right 01/17/2021   Procedure: RIGHT STEREOTACTIC BIOPSY OF INSULAR LESION;  Surgeon: Debby Dorn MATSU, MD;  Location: [facility] OR;  Service: Neurosurgery;  Laterality: Right;   KNEE ARTHROSCOPY W/ MENISCAL REPAIR Right 06/23/1988   POLYPECTOMY  01/07/2023   Procedure: POLYPECTOMY;  Surgeon: Jinny Carmine, MD;  Location: [facility] ENDOSCOPY;  Service:  Endoscopy;;   removal of birthmark  06/08/2013   SKIN CANCER EXCISION  06/23/2012   TYMPANOSTOMY TUBE PLACEMENT  08/06/1978   URETEROSCOPY WITH HOLMIUM LASER LITHOTRIPSY Right 01/25/2016   Procedure: URETEROSCOPY WITH HOLMIUM LASER LITHOTRIPSY;  Surgeon: Redell Lynwood Napoleon, MD;  Location: [facility] ORS;  Service: Urology;  Laterality: Right;   URETHRAL STRICTURE DILATATION     visual inspection of vocal cord     1973   WISDOM TOOTH EXTRACTION     Patient Active Problem List   Diagnosis Date Noted   Nausea and vomiting 01/07/2024   Malnutrition of moderate degree 01/01/2024   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) 12/30/2023   Acute right ACA stroke (HCC) 12/24/2023   Acute CVA (cerebrovascular accident) (HCC) 12/18/2023   Psychomotor agitation 12/11/2023   Sepsis due to gram-negative UTI (HCC) 12/09/2023   Abnormal brain MRI 12/09/2023   Dyslipidemia 12/09/2023   Dysphagia 09/14/2023   Gait disorder 09/14/2023   Seizure disorder (HCC) 09/10/2023   History of CVA (cerebrovascular accident) 09/09/2023   Essential hypertension 09/09/2023   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 Dr. Buckley felt this was secondary to evolving radiation necrosis v/s novel small vessel infracts and was changed to Eliquis  and abilify   briefly added for ongoing behavorial issues.  He was readmitted on 12/18/23 with recurrent falls with left sided weakness. Repeat MRI brain done revaling new 3.2 X 1.3 cm focus of right basal ganglia infarct  and recent right temporal restricted diffusion no longer present. EEG was negative for seizures and showed cortical dysfunction arising from right fronto-temporal region likely due to underlying structural abnormality. Neurology recommended increasing Lamictal  dose for possible partial seizures.   He continued to have waxing and waning of baseline aphasia w/non verbal state at times and left sided weakness as well as left gaze preference with left hemianopia and left facial droop on 07/01 per exam by Dr. Michaela. 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 MRI brain repeated revealing progressive restricted diffusion mid and distal R-ACA territory infarct, ongoing multifocal right MCA deep white and gray matter unchanged from 06/27 and underlying chronic ischemic disease with encephalomalacia.  PAIN:  Are you having pain? No  FALLS: Has patient fallen in last 6 months?  Yes, Number of falls: 3  LIVING ENVIRONMENT: Lives with: lives with their family Lives in: House/apartment  PLOF:  Level of assistance: Independent with ADLs Employment: On disability  PATIENT GOALS: pt unable to state, spouse desires improved communication, decreased frustration  OBJECTIVE:  Note: Objective measures were completed at Evaluation unless otherwise noted.  Inpatient Rehab ST: Clinical Impression/Discharge Summary:  Pt made slight progress this stay, as evidenced by mastery of 5/5 LTGs. Severe to profound expressive and moderate  receptive aphasia remain at this time. He requires cues to clear L buccal cavity and maintain slow rate, but is tolerating D3 textures/thin liquids w/ minA. Frustration tolerance and receptiveness to education/cueing will continue to limit his overall progress, however, recommend cont ST upon d/c to target remaining deficits, maximize pt communication, and maximize pt independence.   COGNITION: Overall cognitive status: Difficulty to assess due to: Communication impairment  AUDITORY COMPREHENSION: Overall auditory comprehension: Impaired: simple, moderately complex, and complex YES/NO questions: Impaired: moderately complex and complex Following directions: Appears intact, single step. Did not attempt multi-step Conversation: Impaired, Simple Interfering components: attention, processing speed, and working memory Effective technique: extra processing time, repetition/stressing words, slowed speech, and stressing words  READING COMPREHENSION: Impaired: L neglect  EXPRESSION: verbal  VERBAL EXPRESSION: Level of generative/spontaneous verbalization: conversation Automatic speech: name: impaired, social response: impaired, counting: intact, and day of week: impaired  Repetition: Impaired: Word Naming: Confrontation: 0% accuracy Pragmatics: Impaired: abnormal effect, topic appropriateness, topic maintenance, and turn taking Interfering components: auditory comprehension, attention  Effective technique: sentence completion, phonemic cues, and articulatory cues Non-verbal means of communication: none  WRITTEN EXPRESSION: Dominant hand: right Written expression: Impaired: word  MOTOR SPEECH: Overall motor speech: Appears intact  ORAL MOTOR EXAMINATION: Overall status: Impaired:   Lingual: Left (ROM and Symmetry)  STANDARDIZED ASSESSMENTS: QAB severe impairment  Word comprehension 10.00  Sentence comprehension 0.83  Word finding 0.00  Grammatical construction 2.00  Speech motor  programming 10.00  Repetition 0.00  Reading 7.92  QAB overall 4.02   PATIENT REPORTED OUTCOME MEASURES (PROM): Deferred d/t severity of impairment                                                                                                                          TREATMENT DATE:  01/21/24: Explained rationale for speech generating device recommendation. Discussed benefits of increased independence with communication and to support rehabilitation. Discussed severity of impairment and prior level of functioning supporting likelihood of slow rehab course, and benefit of SGD for improved communication while rehabilitating. Pt does not desire AAC at this time   PATIENT EDUCATION: Education details: POC/goals Person educated: Patient and Spouse Education method: Explanation Education comprehension: verbalized understanding and needs further education   GOALS: Goals reviewed with patient? Yes  SHORT TERM GOALS: Target date: 03/17/2024  Patient will use multimodal communication to communicate basic wants and needs with rare min-A Baseline: Goal status: INITIAL  2.  Patient and caregivers will generate x10 functional home-based targets for low or high tech AAC system  Baseline:  Goal status: INITIAL  3.  Patient will complete word level structured language tasks with mod-A  Baseline:  Goal status: INITIAL  4.  Pt will demonstrate accurate gesture for given target with 80% accuracy  Baseline:  Goal status: INITIAL   LONG TERM GOALS: Target date: 04/14/2024  Pt will use multimodal communication to communicate preferences in simple conversation  Baseline:  Goal status: INITIAL  2.  Pt and caregivers will carryover 3 compensatory strategies to support pt's auditory comprehension  Baseline:  Goal status: INITIAL  3.  Pt and caregiver will demonstrate supportive communication techniques in conversation to support pt expressive language Baseline:  Goal status: INITIAL  4.  Pt  and spouse will carryover 1-2 language enhancing activities at home daily 5/7 days  Baseline:  Goal status: INITIAL  5.  Pt will generate moderately complex sentences in structured task such as VNeST with usual min A  Baseline:  Goal status: INITIAL   ASSESSMENT:  CLINICAL IMPRESSION: Patient is a 53 y.o. M who was seen today for aphasia evaluation. Known to clinic from prior ST course, since has had another stroke. Communication impairment during prior ST course was severe, profound impairment at this time. Pt presenting with fluent aphsia c/b nonsensical speech that lacks meaning, tangential speech, usual use of a few rote phrases repetitively (the thing, the thing, that which is). During picture description task, pt says that, that, that with no content.  Benefit observed from phonemic and phrase completion cues to aid in anomia recovery.  Patient unable to repeat, challenges with answering yes no questions.  Per spouse primary means of communication was asking yes/no questions prior to most recent stroke, this new impairment has significantly impacted her communication and skills in the home environment.  Patient  appears to lack awareness, he typically responds to questions with a long-winded nonsensical speech requiring SLP cues to stop.  Due to severity of impairment and prior level of function I highly recommend implementation of a speech generating device.  At this time patient declines will continue to address in future therapy sessions.  Spouse appears receptive to idea of communication support.   OBJECTIVE IMPAIRMENTS: include attention, awareness, executive functioning, and aphasia. These impairments are limiting patient from effectively communicating at home and in community. Factors affecting potential to achieve goals and functional outcome are previous level of function and severity of impairments. Patient will benefit from skilled SLP services to address above impairments and  improve overall function.  REHAB POTENTIAL: Fair (severity of impairments)  PLAN:  SLP FREQUENCY: 2x/week  SLP DURATION: 12 weeks  PLANNED INTERVENTIONS: Language facilitation, Cueing hierachy, Internal/external aids, Functional tasks, Multimodal communication approach, SLP instruction and feedback, Compensatory strategies, and Patient/family education    Harlene LITTIE Ned, CCC-SLP 01/21/2024, 10:19 AM

## 2024-01-22 ENCOUNTER — Telehealth: Payer: Self-pay

## 2024-01-22 NOTE — Progress Notes (Signed)
 Complex Care Management Note  Care Guide Note 01/22/2024 Name: Alan Wise MRN: [id number] DOB: 19-Oct-1970  Alan Wise is a 53 y.o. year old male who sees Baity, Angeline ORN, NP for primary care. I reached out to Alan Wise by phone today to offer complex care management services.  Alan Wise was given information about Complex Care Management services today including:   The Complex Care Management services include support from the care team which includes your Nurse Care Manager, Clinical Social Worker, or Pharmacist.  The Complex Care Management team is here to help remove barriers to the health concerns and goals most important to you. Complex Care Management services are voluntary, and the patient may decline or stop services at any time by request to their care team member.   Complex Care Management Consent Status: Patient agreed to services and verbal consent obtained.   Follow up plan:  Telephone appointment with complex care management team member scheduled for:  02/10/2024  Encounter Outcome:  Patient Scheduled  Alan Wise , RMA     [place]  [facility], [facility] Guide  Direct Dial: [phone]  Website: [email or web address]

## 2024-01-25 ENCOUNTER — Ambulatory Visit: Attending: Physical Medicine and Rehabilitation

## 2024-01-25 ENCOUNTER — Other Ambulatory Visit: Payer: Self-pay

## 2024-01-25 DIAGNOSIS — R2681 Unsteadiness on feet: Secondary | ICD-10-CM | POA: Insufficient documentation

## 2024-01-25 DIAGNOSIS — I69318 Other symptoms and signs involving cognitive functions following cerebral infarction: Secondary | ICD-10-CM | POA: Diagnosis present

## 2024-01-25 DIAGNOSIS — R41841 Cognitive communication deficit: Secondary | ICD-10-CM | POA: Insufficient documentation

## 2024-01-25 DIAGNOSIS — R4701 Aphasia: Secondary | ICD-10-CM | POA: Insufficient documentation

## 2024-01-25 DIAGNOSIS — M6281 Muscle weakness (generalized): Secondary | ICD-10-CM | POA: Diagnosis present

## 2024-01-25 DIAGNOSIS — R41840 Attention and concentration deficit: Secondary | ICD-10-CM | POA: Insufficient documentation

## 2024-01-25 DIAGNOSIS — R2689 Other abnormalities of gait and mobility: Secondary | ICD-10-CM | POA: Diagnosis present

## 2024-01-25 DIAGNOSIS — I6300 Cerebral infarction due to thrombosis of unspecified precerebral artery: Secondary | ICD-10-CM | POA: Diagnosis present

## 2024-01-25 DIAGNOSIS — R41842 Visuospatial deficit: Secondary | ICD-10-CM | POA: Diagnosis present

## 2024-01-25 DIAGNOSIS — R278 Other lack of coordination: Secondary | ICD-10-CM | POA: Diagnosis present

## 2024-01-25 NOTE — Therapy (Signed)
 OUTPATIENT PHYSICAL THERAPY NEURO EVALUATION   Patient Name: Alan Wise MRN: [id number] DOB:October 16, 1970, 53 y.o., male Today's Date: 01/25/2024   PCP: Angeline Laura, NP REFERRING PROVIDER: Toribio Pitch, PA-C  END OF SESSION:  PT End of Session - 01/25/24 1354     Visit Number 1    Number of Visits 5    Date for PT Re-Evaluation 02/22/24    Authorization Type [employer]    PT Start Time 1100    PT Stop Time 1145    PT Time Calculation (min) 45 min    Equipment Utilized During Treatment Gait belt    Activity Tolerance Patient tolerated treatment well    Behavior During Therapy WFL for tasks assessed/performed          Past Medical History:  Diagnosis Date   Allergy    Complication of anesthesia    pt reports he is starting to have more difficulty arousing after surgery   Diabetes mellitus (HCC)    GERD (gastroesophageal reflux disease)    Headache    History of kidney stones    Hyperlipidemia    Renal disorder    kidney stones   Past Surgical History:  Procedure Laterality Date   APPLICATION OF CRANIAL NAVIGATION Right 01/17/2021   Procedure: APPLICATION OF CRANIAL NAVIGATION;  Surgeon: Debby Dorn MATSU, MD;  Location: [facility] OR;  Service: Neurosurgery;  Laterality: Right;   COLONOSCOPY  09/03/1994   COLONOSCOPY WITH PROPOFOL  N/A 01/07/2023   Procedure: COLONOSCOPY WITH PROPOFOL ;  Surgeon: Jinny Carmine, MD;  Location: [facility] ENDOSCOPY;  Service: Endoscopy;  Laterality: N/A;  NOT TOO EARLY   CYSTOSCOPY WITH STENT PLACEMENT Right 01/25/2016   Procedure: CYSTOSCOPY WITH STENT PLACEMENT;  Surgeon: Redell Lynwood Napoleon, MD;  Location: [facility] ORS;  Service: Urology;  Laterality: Right;   FRAMELESS  BIOPSY WITH BRAINLAB Right 01/17/2021   Procedure: RIGHT STEREOTACTIC BIOPSY OF INSULAR LESION;  Surgeon: Debby Dorn MATSU, MD;  Location[facility] OR;  Service: Neurosurgery;  Laterality: Right;   KNEE ARTHROSCOPY W/ MENISCAL REPAIR Right 06/23/1988   POLYPECTOMY  01/07/2023    Procedure: POLYPECTOMY;  Surgeon: Jinny Carmine, MD;  Location: [facility] ENDOSCOPY;  Service: Endoscopy;;   removal of birthmark  06/08/2013   SKIN CANCER EXCISION  06/23/2012   TYMPANOSTOMY TUBE PLACEMENT  08/06/1978   URETEROSCOPY WITH HOLMIUM LASER LITHOTRIPSY Right 01/25/2016   Procedure: URETEROSCOPY WITH HOLMIUM LASER LITHOTRIPSY;  Surgeon: Redell Lynwood Napoleon, MD;  Location: [facility] ORS;  Service: Urology;  Laterality: Right;   URETHRAL STRICTURE DILATATION     visual inspection of vocal cord     1973   WISDOM TOOTH EXTRACTION     Patient Active Problem List   Diagnosis Date Noted   Nausea and vomiting 01/07/2024   Malnutrition of moderate degree 01/01/2024   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) 12/30/2023   Acute right ACA stroke (HCC) 12/24/2023   Acute CVA (cerebrovascular accident) (HCC) 12/18/2023   Psychomotor agitation 12/11/2023   Sepsis due to gram-negative UTI (HCC) 12/09/2023   Abnormal brain MRI 12/09/2023   Dyslipidemia 12/09/2023   Dysphagia 09/14/2023   Gait disorder 09/14/2023   Seizure disorder (HCC) 09/10/2023   History of CVA (cerebrovascular accident) 09/09/2023   Essential hypertension 09/09/2023   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   Type 2 diabetes mellitus with hyperlipidemia (HCC) 06/12/2014   Hyperlipidemia associated with type 2 diabetes mellitus (HCC) 06/12/2014    ONSET DATE:  10/18/23  REFERRING DIAG: acute right ACA stroke  THERAPY DIAG:  Unsteadiness on feet  Muscle weakness (generalized)  Other symptoms and signs involving cognitive functions following cerebral infarction  Rationale for Evaluation and Treatment: Rehabilitation  SUBJECTIVE:                                                                                                                                                                                              SUBJECTIVE STATEMENT: 53 y.o. male [facility]/18/[facility] after falling two days prior with residual L sided weakness and somnolence. Brain MRI showed new patchy restricted diffusion in R basal ganglia, new punctuate focus of restricted diffusion in R caudate head. Wife reports he was found on the floor before recent hospitalization. Recent admit 6/17-6/19 with anterior R temporal lobe punctate restricted diffusion. Pt present with wife. Wife rports he doesn't like using his walker at home and when she tries to have him use it it turns into argument. He uses walker outside but then just carries it with him. Pt and wife denies any fall since coming home. Wife reports that he had one seizure while he was in the hospital. He didn't take his seizure medication this morning as he wanted to wait until after therapy appts. Wife reports seizure medication does affect his physical abilities at times. Pt accompanied by: self  PERTINENT HISTORY: PMHx: T2DM, GERD, CVA w/ L sided weakness, oligodendroglioma  PAIN:  Are you having pain? No  PRECAUTIONS: Fall  RED FLAGS: None   WEIGHT BEARING RESTRICTIONS: No  FALLS: Has patient fallen in last 6 months? Yes. Number of falls 1  LIVING ENVIRONMENT: Lives with: lives with their spouse Lives in: House/apartment Stairs: Yes: Internal: 13 steps Has following equipment at home: Walker - 2 wheeled and None  PLOF: Needs assistance with homemaking  PATIENT GOALS: improve balance  OBJECTIVE:  Note: Objective measures were completed at Evaluation unless otherwise noted.  DIAGNOSTIC FINDINGS: 01/15/24 IMPRESSION: 1. Interval evolution of the nonhemorrhagic infarcts involving the right single lid gyrus, right basal ganglia and periventricular white matter and left lentiform nucleus. 2. Stable appearance of right frontal lobe previously treated neoplasm, with hemosiderin staining from prior hemorrhage and surrounding gliosis. No adverse interval  change.  COGNITION: Overall cognitive status: Difficulty to assess due to: Communication impairment    LOWER EXTREMITY ROM:     Active  Right Eval Left Eval  Hip flexion    Hip extension    Hip abduction    Hip adduction    Hip internal rotation    Hip external rotation    Knee flexion  Knee extension    Ankle dorsiflexion    Ankle plantarflexion    Ankle inversion    Ankle eversion     (Blank rows = not tested)  LOWER EXTREMITY MMT:    MMT Right Eval Left Eval  Hip flexion    Hip extension    Hip abduction    Hip adduction    Hip internal rotation    Hip external rotation    Knee flexion    Knee extension    Ankle dorsiflexion 5/5 5/5  Ankle plantarflexion    Ankle inversion    Ankle eversion    (Blank rows = not tested) GAIT: Findings: Gait Characteristics: poor foot clearance- Right and poor foot clearance- Left, Distance walked: 230', and Comments: Pt has occasional steppage gait which can occur left or right foot and is not due to foot drop. According to patient he has always done that since he was young (before his 2 strokes and chemo/radiation treatments). Pt denies falls from this steppage gait.  FUNCTIONAL TESTS:  5 times sit to stand: 11.20 sec (01/25/24) 10 meter walk test: 1.1 m/s without AD (01/25/24) Functional gait assessment:  FUNCTIONAL GAIT ASSESSMENT  Date: 01/25/24 Score  GAIT LEVEL SURFACE Instructions: Walk at your normal speed from here to the next mark (6 m) [20 ft]. (3) Normal - Walks 6 m (20 ft) in less than 5.5 seconds, no assistive devices, good speed, no evidence for imbalance, normal gait pattern, deviates no more than 15.24 cm (6 in) outside of the 30.48-cm (12-in) walkway width.  2.   CHANGE IN GAIT SPEED Instructions: Begin walking at your normal pace (for 1.5 m [5 ft]). When I tell you "go," walk as fast as you can (for 1.5 m [5 ft]). When I tell you "slow," walk as slowly as you can (for 1.5 m [5 ft]. (3) Normal - Able to smoothly  change walking speed without loss of balance or gait deviation. Shows a significant difference in walking speeds between normal, fast, and slow speeds. Deviates no more than 15.24 cm (6 in) outside of the 30.48-cm (12-in) walkway width.  3.    GAIT WITH HORIZONTAL HEAD TURNS Instructions: Walk from here to the next mark 6 m (20 ft) away. Begin walking at your normal pace. Keep walking straight; after 3 steps, turn your head to the right and keep walking straight while looking to the right. After 3 more steps, turn your head to the left and keep walking straight while looking left. Continue alternating looking right and left. (2) Mild impairment - Performs head turns smoothly with slight change in gait velocity (eg, minor disruption to smooth gait path), deviates 15.24 -25.4 cm (6 -10 in) outside 30.48-cm (12-in) walkway width, or uses an assistive device.  4.   GAIT WITH VERTICAL HEAD TURNS Instructions: Walk from here to the next mark (6 m [20 ft]). Begin walking at your normal pace. Keep walking straight; after 3 steps, tip your head up and keep walking straight while looking up. After 3 more steps, tip your head down, keep walking straight while looking down. Continue  alternating looking up and down every 3 steps until you have completed 2 repetitions in each direction. (3) Normal - Performs head turns with no change in gait. Deviates no more than 15.24 cm (6 in) outside 30.48-cm (12-in) walkway width.  5.  GAIT AND PIVOT TURN Instructions: Begin with walking at your normal pace. When I tell you, "turn and stop," turn as quickly  as you can to face the opposite direction and stop. (3) Normal - Pivot turns safely within 3 seconds and stops quickly with no loss of balance  6.   STEP OVER OBSTACLE Instructions: Begin walking at your normal speed. When you come to the shoe box, step over it, not around it, and keep walking. (3) Normal - Is able to step over 2 stacked shoe boxes taped together (22.86 cm [9 in]  total height) without changing gait speed; no evidence of imbalance.  7.   GAIT WITH NARROW BASE OF SUPPORT Instructions: Walk on the floor with arms folded across the chest, feet aligned heel to toe in tandem for a distance of 3.6 m [12 ft]. The number of steps taken in a straight line are counted for a maximum of 10 steps. (1) Moderate impairment - Ambulates 4 -7 steps.  8.   GAIT WITH EYES CLOSED Instructions: Walk at your normal speed from here to the next mark (6 m [20 ft]) with your eyes closed. (3) Normal - Walks 6 m (20 ft), no assistive devices, good speed, no evidence of imbalance, normal gait pattern, deviates no more than 15.24 cm (6 in) outside 30.48-cm (12-in) walkway width. Ambulates 6 m (20 ft) in less than 7 seconds.  9.   AMBULATING BACKWARDS Instructions: Walk backwards until I tell you to stop (1) Moderate impairment - Walks 6 m (20 ft), slow speed, abnormal gait pattern, evidence for imbalance, deviates 25.4 -38.1 cm (10 -15 in) outside 30.48-cm (12-in) walkway width.  10. STEPS Instructions: Walk up these stairs as you would at home (ie, using the rail if necessary). At the top turn around and walk down. (1) Moderate impairment-Two feet to a stair; must use rail.  Total 24/30   Interpretation of scores: Non-Specific Older Adults Cutoff Score: <=22/30 = risk of falls Parkinson's Disease Cutoff score <15/30= fall risk (Hoehn & Yahr 1-4)  Minimally Clinically Important Difference (MCID)  Stroke (acute, subacute, and chronic) = MDC: 4.2 points Vestibular (acute) = MDC: 6 points Community Dwelling Older Adults =  MCID: 4 points Parkinson's Disease  =  MDC: 4.3 points  (Academy of Neu[employer]ic Physical [employer] (nd). Functional Gait Assessment. Retrieved from https://www.neuropt.org/docs/default-source/cpgs/core-outcome-measures/function-gait-assessment-pocket-guide-proof9-(2).pdf?sfvrsn=b29f35043_0.)D                                                                                                                               TREATMENT DATE:   01/25/24 Discussed evaulation findings with patient and wife. Compared functional test scores from today's session and last reassessment when patient was here in PT before his recent stroke. Wife and patient educated that his gait and balance are to similar level of function before his recent stroke in July. Based on FGA and gait speed, he doesn't require walker and should be able to ambulate safely without AD. We also considered that patient doesn't really use walker anyways at home and in community he prefers to lift and carry walker  instead of using it as intended.   Adjusted walker height to suit patient better as it was set lower for the patient. Also supplied tennis balls over the rear gliders to improve smoother operations of the walker. Pt and wife educated to use the walker if he feels balance is off for safety at home and community.  Wife educated that patient should be able to go up and down steps safetly as long as he has rail to hold onto.  PATIENT EDUCATION: Education details: see above Person educated: Patient Education method: Explanation Education comprehension: verbalized understanding  HOME EXERCISE PROGRAM: TBD  GOALS: Goals reviewed with patient? Yes SHORT TERM GOALS: no STG as POC<4 weeks.   LONG TERM GOALS: Target date: 02/22/2024    Patient will demo 5x sit to stand score of <10 sec to improve overall functional strength. Baseline: 34 s without UE support; 19 seconds (11/18/23); 10.25 seconds (11/30/23); 11.19 sec (01/25/24) Goal status: Initial   2.  Pt will demo functional gait assessment score of >2730 to improve overall balance with functional gait. Baseline: 20/30 (eval); 21/30 (11/18/23); 24/30 (01/25/24) Goal status: Progressing continue  ASSESSMENT:  CLINICAL IMPRESSION: Patient is a 53 y.o. male who was seen today for physical therapy evaluation and treatment for gait and balance disorder after  acute stroke. Patient was previously being seen in rehab for PT and SLP prior to his recent stroke in July, 2025. Compared to his previous course of outpatient PT, patient is currently demonstrating improved gait speed and functional gait assessment scores. Patient has some difficulties with backwards walking, walking with horizontal head turns and tandem walking. Patient has occasional steppage gait, which he reports has been pre-existing (even before his both strokes). Compared to previous course of PT, patient demo. Significant impairments with function of L UE and his aphasia. Patient will benefit from short course of PT to improve his gait and balance and establish HEP for self management of his symptoms. .   OBJECTIVE IMPAIRMENTS: Abnormal gait, decreased balance, decreased knowledge of condition, and difficulty walking.   ACTIVITY LIMITATIONS: carrying, lifting, squatting, stairs, and transfers  PARTICIPATION LIMITATIONS: meal prep, cleaning, shopping, community activity, and yard work  PERSONAL FACTORS: Time since onset of injury/illness/exacerbation are also affecting patient's functional outcome.   REHAB POTENTIAL: Excellent  CLINICAL DECISION MAKING: Stable/uncomplicated  EVALUATION COMPLEXITY: Low  PLAN:  PT FREQUENCY: 1x/week  PT DURATION: 4 weeks  PLANNED INTERVENTIONS: 97164- PT Re-evaluation, 97110-Therapeutic exercises, 97530- Therapeutic activity, 97112- Neuromuscular re-education, 97535- Self Care, [id number]- Manual therapy, [phone]- Gait training, Patient/Family education, Balance training, Stair training, Joint mobilization, Cognitive remediation, and DME instructions  PLAN FOR NEXT SESSION: Issue HEP, work on backward walking, tandem walking, walking with head turns   Raj LOISE Blanch, PT 01/25/2024, 2:13 PM

## 2024-01-26 ENCOUNTER — Ambulatory Visit

## 2024-01-26 DIAGNOSIS — R2681 Unsteadiness on feet: Secondary | ICD-10-CM | POA: Diagnosis not present

## 2024-01-26 DIAGNOSIS — R4701 Aphasia: Secondary | ICD-10-CM

## 2024-01-26 NOTE — Therapy (Signed)
 OUTPATIENT SPEECH LANGUAGE PATHOLOGY APHASIA TREATMENT   Patient Name: Alan Wise MRN: [id number] DOB:1970/07/15, 53 y.o., male Today's Date: 01/26/2024  PCP: Angeline Laura, NP REFERRING PROVIDER: Toribio Pitch, PA-C  END OF SESSION:  End of Session - 01/26/24 1132     Visit Number 2    Number of Visits 25    Date for SLP Re-Evaluation 04/14/24    Authorization Type [employer]    Progress Note Due on Visit 10    SLP Start Time 1145    SLP Stop Time  1230    SLP Time Calculation (min) 45 min    Activity Tolerance Patient tolerated treatment well           Past Medical History:  Diagnosis Date   Allergy    Complication of anesthesia    pt reports he is starting to have more difficulty arousing after surgery   Diabetes mellitus (HCC)    GERD (gastroesophageal reflux disease)    Headache    History of kidney stones    Hyperlipidemia    Renal disorder    kidney stones   Past Surgical History:  Procedure Laterality Date   APPLICATION OF CRANIAL NAVIGATION Right 01/17/2021   Procedure: APPLICATION OF CRANIAL NAVIGATION;  Surgeon: Debby Dorn MATSU, MD;  Location: [facility] OR;  Service: Neurosurgery;  Laterality: Right;   COLONOSCOPY  09/03/1994   COLONOSCOPY WITH PROPOFOL  N/A 01/07/2023   Procedure: COLONOSCOPY WITH PROPOFOL ;  Surgeon: Jinny Carmine, MD;  Location: [facility] ENDOSCOPY;  Service: Endoscopy;  Laterality: N/A;  NOT TOO EARLY   CYSTOSCOPY WITH STENT PLACEMENT Right 01/25/2016   Procedure: CYSTOSCOPY WITH STENT PLACEMENT;  Surgeon: Redell Lynwood Napoleon, MD;  Location: [facility] ORS;  Service: Urology;  Laterality: Right;   FRAMELESS  BIOPSY WITH BRAINLAB Right 01/17/2021   Procedure: RIGHT STEREOTACTIC BIOPSY OF INSULAR LESION;  Surgeon: Debby Dorn MATSU, MD;  Location: [facility] OR;  Service: Neurosurgery;  Laterality: Right;   KNEE ARTHROSCOPY W/ MENISCAL REPAIR Right 06/23/1988   POLYPECTOMY  01/07/2023   Procedure: POLYPECTOMY;  Surgeon: Jinny Carmine, MD;   Location: [facility] ENDOSCOPY;  Service: Endoscopy;;   removal of birthmark  06/08/2013   SKIN CANCER EXCISION  06/23/2012   TYMPANOSTOMY TUBE PLACEMENT  08/06/1978   URETEROSCOPY WITH HOLMIUM LASER LITHOTRIPSY Right 01/25/2016   Procedure: URETEROSCOPY WITH HOLMIUM LASER LITHOTRIPSY;  Surgeon: Redell Lynwood Napoleon, MD;  Location: [facility] ORS;  Service: Urology;  Laterality: Right;   URETHRAL STRICTURE DILATATION     visual inspection of vocal cord     1973   WISDOM TOOTH EXTRACTION     Patient Active Problem List   Diagnosis Date Noted   Nausea and vomiting 01/07/2024   Malnutrition of moderate degree 01/01/2024   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) 12/30/2023   Acute right ACA stroke (HCC) 12/24/2023   Acute CVA (cerebrovascular accident) (HCC) 12/18/2023   Psychomotor agitation 12/11/2023   Sepsis due to gram-negative UTI (HCC) 12/09/2023   Abnormal brain MRI 12/09/2023   Dyslipidemia 12/09/2023   Dysphagia 09/14/2023   Gait disorder 09/14/2023   Seizure disorder (HCC) 09/10/2023   History of CVA (cerebrovascular accident) 09/09/2023   Essential hypertension 09/09/2023   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   Type 2 diabetes mellitus with hyperlipidemia (HCC) 06/12/2014   Hyperlipidemia associated with type 2 diabetes mellitus (HCC) 06/12/2014    ONSET DATE: 01/14/24 referred   REFERRING DIAG: Acute right  ACA stroke   THERAPY DIAG: Aphasia  Rationale for Evaluation and Treatment: Rehabilitation  SUBJECTIVE:   SUBJECTIVE STATEMENT: Opening conversation c/b vague speech, perseverations, and circumlocution Pt accompanied by: significant other  PERTINENT HISTORY: history of T2DM- A1c 5.4, GERD, renal calculi, 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 Dr. Buckley felt this was secondary to evolving radiation necrosis v/s novel small vessel infracts and was changed to Eliquis  and abilify   briefly added for ongoing behavorial issues.  He was readmitted on 12/18/23 with recurrent falls with left sided weakness. Repeat MRI brain done revaling new 3.2 X 1.3 cm focus of right basal ganglia infarct  and recent right temporal restricted diffusion no longer present. EEG was negative for seizures and showed cortical dysfunction arising from right fronto-temporal region likely due to underlying structural abnormality. Neurology recommended increasing Lamictal  dose for possible partial seizures.   He continued to have waxing and waning of baseline aphasia w/non verbal state at times and left sided weakness as well as left gaze preference with left hemianopia and left facial droop on 07/01 per exam by Dr. Michaela. 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 MRI brain repeated revealing progressive restricted diffusion mid and distal R-ACA territory infarct, ongoing multifocal right MCA deep white and gray matter unchanged from 06/27 and underlying chronic ischemic disease with encephalomalacia.  PAIN:  Are you having pain? No  FALLS: Has patient fallen in last 6 months?  Yes, Number of falls: 3  LIVING ENVIRONMENT: Lives with: lives with their family Lives in: House/apartment  PLOF:  Level of assistance: Independent with ADLs Employment: On disability  PATIENT GOALS: pt unable to state, spouse desires improved communication, decreased frustration  OBJECTIVE:  Note: Objective measures were completed at Evaluation unless otherwise noted.  Inpatient Rehab ST: Clinical Impression/Discharge Summary:  Pt made slight progress this stay, as evidenced by mastery  of 5/5 LTGs. Severe to profound expressive and moderate receptive aphasia remain at this time. He requires cues to clear L buccal cavity and maintain slow rate, but is tolerating D3 textures/thin liquids w/ minA. Frustration tolerance and receptiveness to education/cueing will continue to limit his overall progress, however, recommend cont ST upon d/c to target remaining deficits, maximize pt communication, and maximize pt independence.   COGNITION: Overall cognitive status: Difficulty to assess due to: Communication impairment  AUDITORY COMPREHENSION: Overall auditory comprehension: Impaired: simple, moderately complex, and complex YES/NO questions: Impaired: moderately complex and complex Following directions: Appears intact, single step. Did not attempt multi-step Conversation: Impaired, Simple Interfering components: attention, processing speed, and working memory Effective technique: extra processing time, repetition/stressing words, slowed speech, and stressing words  READING COMPREHENSION: Impaired: L neglect  EXPRESSION: verbal  VERBAL EXPRESSION: Level of generative/spontaneous verbalization: conversation Automatic speech: name: impaired, social response: impaired, counting: intact, and day of week: impaired  Repetition: Impaired: Word Naming: Confrontation: 0% accuracy Pragmatics: Impaired: abnormal effect, topic appropriateness, topic maintenance, and turn taking Interfering components: auditory comprehension, attention  Effective technique: sentence completion, phonemic cues, and articulatory cues Non-verbal means of communication: none  WRITTEN EXPRESSION: Dominant hand: right Written expression: Impaired: word  MOTOR SPEECH: Overall motor speech: Appears intact  ORAL MOTOR EXAMINATION: Overall status: Impaired:  Lingual: Left (ROM and Symmetry)  STANDARDIZED ASSESSMENTS: QAB severe impairment Word comprehension 10.00  Sentence comprehension 0.83  Word finding  0.00  Grammatical construction 2.00  Speech motor programming 10.00  Repetition 0.00  Reading 7.92  QAB overall 4.02   PATIENT REPORTED OUTCOME MEASURES (PROM): Deferred d/t severity of impairment                                                                                                                          TREATMENT DATE:  01/26/24: Given significant aphasia, trialed multimodal communication today, including writing and [employer] TouchTalk device. Pt able to write wife's name with min error to augment naming. Otherwise, writing inconsistent with perseverations and agraphia. Demonstrated how to functionally utilize TouchTalk device for naming preferred foods items. Good oral reading accuracy exhibited. Able to ID preferred choice f/o 3 with good accuracy. Generated AAC categories related to his children. Required usual max A to name interests of children. Cued gestures were beneficial x2. Pt and wife interested in pursuing SGD trials, with SLP submitting information this session.  01/21/24: Explained rationale for speech generating device recommendation. Discussed benefits of increased independence with communication and to support rehabilitation. Discussed severity of impairment and prior level of functioning supporting likelihood of slow rehab course, and benefit of SGD for improved communication while rehabilitating. Pt does not desire AAC at this time   PATIENT EDUCATION: Education details: POC/goals Person educated: Patient and Spouse Education method: Explanation Education comprehension: verbalized understanding and needs further education   GOALS: Goals reviewed with patient? Yes  SHORT TERM GOALS: Target date: 03/17/2024  Patient will use multimodal communication to communicate basic wants and needs with rare min-A Baseline: Goal status: ONGOING  2.  Patient and caregivers will generate x10 functional home-based targets for low or high tech AAC system  Baseline:  Goal  status: ONGOING  3.  Patient will complete word level structured language tasks with mod-A  Baseline:  Goal status: ONGOING  4.  Pt will demonstrate accurate gesture for given target with 80% accuracy  Baseline:  Goal status: ONGOING   LONG TERM GOALS: Target date: 04/14/2024  Pt will use multimodal communication to communicate preferences in simple conversation  Baseline:  Goal status: ONGOING  2.  Pt and caregivers will carryover 3 compensatory strategies to support pt's auditory comprehension  Baseline:  Goal status: ONGOING  3.  Pt and caregiver will demonstrate supportive communication techniques in conversation to support pt expressive language Baseline:  Goal status: ONGOING  4.  Pt and spouse will carryover 1-2 language enhancing activities at home daily 5/7 days  Baseline:  Goal status: ONGOING  5.  Pt will generate moderately complex sentences in structured task such as VNeST with usual min A  Baseline:  Goal status: ONGOING   ASSESSMENT:  CLINICAL IMPRESSION: Patient is a 53 y.o. M who was seen today for aphasia tx. Known to clinic from prior ST course, since has had another stroke. Communication impairment during  prior ST course was severe, profound impairment at this time. Pt presenting with fluent aphsia c/b nonsensical speech that lacks meaning, tangential speech, usual use of a few rote phrases repetitively (the thing, the thing, that which is). During picture description task, pt says that, that, that with no content.  Benefit observed from phonemic and phrase completion cues to aid in anomia recovery.  Patient unable to repeat, challenges with answering yes no questions.  Per spouse primary means of communication was asking yes/no questions prior to most recent stroke, this new impairment has significantly impacted her communication and skills in the home environment.  Patient appears to lack awareness, he typically responds to questions with a long-winded  nonsensical speech requiring SLP cues to stop.  Due to severity of impairment and prior level of function I highly recommend implementation of a speech generating device.  At this time patient declines will continue to address in future therapy sessions.  Spouse appears receptive to idea of communication support.   OBJECTIVE IMPAIRMENTS: include attention, awareness, executive functioning, and aphasia. These impairments are limiting patient from effectively communicating at home and in community. Factors affecting potential to achieve goals and functional outcome are previous level of function and severity of impairments. Patient will benefit from skilled SLP services to address above impairments and improve overall function.  REHAB POTENTIAL: Fair (severity of impairments)  PLAN:  SLP FREQUENCY: 2x/week  SLP DURATION: 12 weeks  PLANNED INTERVENTIONS: Language facilitation, Cueing hierachy, Internal/external aids, Functional tasks, Multimodal communication approach, SLP instruction and feedback, Compensatory strategies, and Patient/family education    Comer LILLETTE Louder, CCC-SLP 01/26/2024, 11:39 AM

## 2024-01-27 ENCOUNTER — Ambulatory Visit: Admitting: Occupational Therapy

## 2024-01-27 DIAGNOSIS — R41840 Attention and concentration deficit: Secondary | ICD-10-CM

## 2024-01-27 DIAGNOSIS — R2681 Unsteadiness on feet: Secondary | ICD-10-CM | POA: Diagnosis not present

## 2024-01-27 DIAGNOSIS — M6281 Muscle weakness (generalized): Secondary | ICD-10-CM

## 2024-01-27 DIAGNOSIS — R278 Other lack of coordination: Secondary | ICD-10-CM

## 2024-01-27 DIAGNOSIS — R41842 Visuospatial deficit: Secondary | ICD-10-CM

## 2024-01-27 NOTE — Therapy (Signed)
 OUTPATIENT OCCUPATIONAL THERAPY NEURO TREATMENT  Patient Name: Alan Wise MRN: [id number] DOB:[birth year], 53 y.o., male Today's Date, male Today's Date: 01/27/2024  PCP: Alan Angeline ORN, NP  REFERRING PROVIDER: Pegge Toribio PARAS, PA-C  END OF SESSION:  OT End of Session - 01/27/24 0803     Visit Number 2    Number of Visits 16    Date for OT Re-Evaluation 03/22/24    Authorization Type [employer] 2025 OOPM MET    Authorization Time Period Covered 100% VL:MN Auth Not Reqd    OT Start Time 0800    OT Stop Time 0845    OT Time Calculation (min) 45 min    Activity Tolerance Patient tolerated treatment well    Behavior During Therapy Agitated;Impulsive          Past Medical History:  Diagnosis Date   Allergy    Complication of anesthesia    pt reports he is starting to have more difficulty arousing after surgery   Diabetes mellitus (HCC)    GERD (gastroesophageal reflux disease)    Headache    History of kidney stones    Hyperlipidemia    Renal disorder    kidney stones   Past Surgical History:  Procedure Laterality Date   APPLICATION OF CRANIAL NAVIGATION Right 01/17/2021   Procedure: APPLICATION OF CRANIAL NAVIGATION;  Surgeon: Alan Dorn MATSU, MD;  Location: [facility] OR;  Service: Neurosurgery;  Laterality: Right;   COLONOSCOPY  09/03/1994   COLONOSCOPY WITH PROPOFOL  N/A 01/07/2023   Procedure: COLONOSCOPY WITH PROPOFOL ;  Surgeon: Alan Carmine, MD;  Location: [facility] ENDOSCOPY;  Service: Endoscopy;  Laterality: N/A;  NOT TOO EARLY   CYSTOSCOPY WITH STENT PLACEMENT Right 01/25/2016   Procedure: CYSTOSCOPY WITH STENT PLACEMENT;  Surgeon: Alan Lynwood Napoleon, MD;  Location: [facility] ORS;  Service: Urology;  Laterality: Right;   FRAMELESS  BIOPSY WITH BRAINLAB Right 01/17/2021   Procedure: RIGHT STEREOTACTIC BIOPSY OF INSULAR LESION;  Surgeon: Alan Dorn MATSU, MD;  Location: [facility] OR;  Service: Neurosurgery;  Laterality: Right;   KNEE ARTHROSCOPY W/ MENISCAL REPAIR Right 06/23/1988    POLYPECTOMY  01/07/2023   Procedure: POLYPECTOMY;  Surgeon: Alan Carmine, MD;  Location: [facility] ENDOSCOPY;  Service: Endoscopy;;   removal of birthmark  06/08/2013   SKIN CANCER EXCISION  06/23/2012   TYMPANOSTOMY TUBE PLACEMENT  08/06/1978   URETEROSCOPY WITH HOLMIUM LASER LITHOTRIPSY Right 01/25/2016   Procedure: URETEROSCOPY WITH HOLMIUM LASER LITHOTRIPSY;  Surgeon: Alan Lynwood Napoleon, MD;  Location: [facility] ORS;  Service: Urology;  Laterality: Right;   URETHRAL STRICTURE DILATATION     visual inspection of vocal cord     1973   WISDOM TOOTH EXTRACTION     Patient Active Problem List   Diagnosis Date Noted   Nausea and vomiting 01/07/2024   Malnutrition of moderate degree 01/01/2024   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) 12/30/2023   Acute right ACA stroke (HCC) 12/24/2023   Acute CVA (cerebrovascular accident) (HCC) 12/18/2023   Psychomotor agitation 12/11/2023   Sepsis due to gram-negative UTI (HCC) 12/09/2023   Abnormal brain MRI 12/09/2023   Dyslipidemia 12/09/2023   Dysphagia 09/14/2023   Gait disorder 09/14/2023   Seizure disorder (HCC) 09/10/2023   History of CVA (cerebrovascular accident) 09/09/2023   Essential hypertension 09/09/2023   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 (Therapist also observed this to be true during tx  session today)   Pt aphasic - verbal but unable to communicate what he wanted. Pt did have occasional automatic appropriate responses.  Wife reports he had first stroke in March, possibly another smaller stroke, then this new stroke end of June with worsened symptoms. 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  Presented to the hospital 09/14/2023 with worsening gait left-sided weakness/aphasia and new onset dysphagia.  Hospitalized from 3-19 to 3-21 with CVA and breakthrough seizures.    PRECAUTIONS: Fall and Other: impulsive, decreased awareness, Lt visual field loss, seizures (recent one in beginning of July - 7th or 8th)   WEIGHT BEARING RESTRICTIONS: No  PAIN:  Are you having pain? No  FALLS: Has patient fallen in last 6 months? Yes. Number of falls 3  LIVING ENVIRONMENT: Lives with: lives with their family (wife, 71 y.o. dtr, 51 y.o. 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  HAND DOMINANCE: Left, but now using Rt hand  ADLs: Overall ADLs: Pt requires 24 hr supervison Transfers/ambulation related to ADLs: pt not using  walker safety/correctly often lifting it Eating: mod I eating with Rt hand since March (first stroke), wife cuts food  Grooming: mod I with Rt hand, wife trimming beard and/or shaving UB Dressing: independent LB Dressing: assist for Lt shoe and sock, and occasionally RT sock Toileting: independent Bathing: distant sup (wife reports she's not allowed in there - pt will not let her assist)  Tub Shower transfers: mod I  Equipment: none  IADLs: dep for all IADLS Community mobility: Has not driven since Nov 2024 d/t seizure, then more seizures, then first stroke Medication management: wife giving pt all medications Financial management: dep Handwriting: unable d/t weakness and aphasia but able to sign name only with assist from Lt hand  MOBILITY STATUS: using walker but often lifts it and does not use correctly   FUNCTIONAL OUTCOME MEASURES: TBA  UPPER EXTREMITY ROM:  RUE AROM WNLs LUE more limited at shoulder but distally WFLs   Active ROM Right eval Left eval  Shoulder flexion  90*  Shoulder abduction    Shoulder adduction    Shoulder extension    Shoulder internal rotation  WFL's  Shoulder external rotation  Approx 50%  Elbow flexion    Elbow extension  Wrist flexion    Wrist extension    Wrist ulnar deviation    Wrist radial deviation    Wrist pronation    Wrist supination    (Blank rows = not tested)    HAND FUNCTION: Grip strength: Right: 60.4 lbs; Left: 41.8 lbs  COORDINATION: Pt can pick up pen Lt hand but cannot manipulate (? Apraxia as well as decreased coordination) - pt has had decline in coordination since recent new stroke Box & Blocks: Lt = 9  SENSATION: Appears WFL's - unable to localize stimulation but ? If this is due to aphasia  EDEMA: none  MUSCLE TONE: LUE: Mild and Hypertonic  COGNITION: Overall cognitive status: Impaired and significantly impaired expressive aphasia, and moderate receptive aphasia. Pt easily frustrated trying to  express himself. Pt also impulsive  VISION: Subjective report: per wife: vision has decreased more, but impaired prior to first stroke Baseline vision: Wears glasses for reading only Visual history: changes in vision prior to CVA with necrosis from radiation  VISION ASSESSMENT: Impaired Visual Fields: Left homonymous hemianopsia  Decreased general awareness and attention also - especially to Lt side  PERCEPTION: Impaired: Inattention/neglect: does not attend to left visual field and does not attend to left side of body  PRAXIS: Impaired: Ideomotor  OBSERVATIONS: Lt side improving from first stroke, then recent stroke made it worse. Pt becomes very agitated/frustrated when he is unable to communicate what he wants to say and gets mad at spouse when she tries to speak (for clarification).                                                                                                                              TREATMENT DATE: 01/27/24  Pt issued updated visual scanning strategies and visual scanning activities and reviewed with pt/wife - see pt instructions for details. Recommended simple puzzles (b/t 12-48 pc), simple word searches, connect the dot, and familiar card/board games d/t aphasia and visual deficits  Pt issued coordination HEP - see pt instructions. Pt able to do all with some difficulty (except rotating card in hand). Pt with some apraxia as well but has improved since evaluation. Pt also able to copy his name from above line where therapist wrote name first. Pt did best with built up pen using tan foam. Issued tan foam for home use.  Also recommended repetitive simple use of LUE by placing checkers into Connect 4 (family has game at home) while placing oven mitt on RUE to force use of LUE - ONLY while seated.  Also reviewed previously issued cane/dowel HEP from 11/18/23 - pt required cues to attend more to LUE keeping dowel level w/ BUE sh flexion. Pt looking at LUE well with  shoulder abduction ex. Therapist d/c ex #2 (sh IR)  Pt overall less frustrated today, and slightly more automatic speech return.    PATIENT EDUCATION: Education details: visual scanning strategies and activities, coordination HEP  Person educated: Patient and  Spouse Education method: Explanation, Demonstration, Tactile cues, Verbal cues, and Handouts Education comprehension: wife verbalized understanding, returned demonstration, verbal cues required, and needs further education  HOME EXERCISE PROGRAM: 01/27/24: visual scanning strategies and activities, coordination HEP    GOALS: Goals reviewed with patient? Yes   SHORT TERM GOALS: Target date: 02/20/24  Pt to be independent donning/doffing Lt shoe and both socks Baseline: Goal status: INITIAL  2.  Pt will be able to demo management of anger when he gets frustrated Baseline:  Goal status: INITIAL  3.  Pt/family to verbalize understanding of visual scanning strategies and pt able to recall at least 2 compensatory strategies for visual impairment without cueing Baseline:  Goal status: IN PROGRESS  4.  Pt independent with HEP for LT shoulder ROM and Lt hand coordination Baseline:  Goal status: IN PROGRESS  5.  Pt will improve LUE function as evidenced by performing 15 blocks on Box & Blocks test Baseline: 9 blocks Goal status: INITIAL   LONG TERM GOALS: Target date: 03/22/24  Pt will be independent with updated HEP  Baseline:  Goal status: INITIAL  2.  Patient will return demonstration of visual adaptation use and verbalize understanding of how to obtain item(s) if desired.  Baseline:  Goal status: INITIAL  3.  Patient will demo improved FM coordination as evidenced by completing nine-hole peg with LUE use and assist from RUE prn in 60 sec or under Baseline: unable Lt hand Goal status: INITIAL  4.  Pt with improve LUE function as evidenced by performing 20 or more blocks on Box & Blocks Baseline: 9 blocks Goal  status: INITIAL  5.  Pt to write name Lt hand with 90% legibility Baseline:  Goal status: INITIAL  6.  Pt will perform environmental scanning safely with min cues for 75% accuracy Baseline:  Goal status: INITIAL  ASSESSMENT:  CLINICAL IMPRESSION: Patient improved since evaluation w/ less frustration from expressive aphasia and slightly improved coordination and automatic speech as well. Pt continues to benefit from reinforcement of visual scanning strategies and attending to Lt side.  Pt progressing towards some STG's.   PERFORMANCE DEFICITS: in functional skills including ADLs, IADLs, coordination, proprioception, tone, ROM, strength, Fine motor control, Gross motor control, mobility, balance, body mechanics, endurance, decreased knowledge of precautions, decreased knowledge of use of DME, vision, and UE functional use, cognitive skills including attention, perception, and safety awareness, and psychosocial skills including environmental adaptation.   IMPAIRMENTS: are limiting patient from ADLs, IADLs, rest and sleep, leisure, and social participation.   CO-MORBIDITIES: has co-morbidities such as oligodendroglioma, seizures that affects occupational performance. Patient will benefit from skilled OT to address above impairments and improve overall function.  MODIFICATION OR ASSISTANCE TO COMPLETE EVALUATION: Min-Moderate modification of tasks or assist with assess necessary to complete an evaluation.  OT OCCUPATIONAL PROFILE AND HISTORY: Detailed assessment: Review of records and additional review of physical, cognitive, psychosocial history related to current functional performance.  CLINICAL DECISION MAKING: Moderate - several treatment options, min-mod task modification necessary  REHAB POTENTIAL: Fair decreased insight/awareness into deficits, decreased attention to Lt side, aphasia  EVALUATION COMPLEXITY: Moderate    PLAN:  OT FREQUENCY: 2x/week  OT DURATION: 8  weeks  PLANNED INTERVENTIONS: 97535 self care/ADL training, 02889 therapeutic exercise, 97530 therapeutic activity, 97112 neuromuscular re-education, 97140 manual therapy, 97010 moist heat, 97129 Cognitive training (first 15 min), 02869 Cognitive training(each additional 15 min), 02239 Orthotic Initial, 97763 Orthotic/Prosthetic subsequent, passive range of motion, balance training, functional mobility training, visual/perceptual remediation/compensation, energy  conservation, coping strategies training, patient/family education, and DME and/or AE instructions  RECOMMENDED OTHER SERVICES: Pt scheduled for upcoming P.T. and SLP evaluations.   CONSULTED AND AGREED WITH PLAN OF CARE: Patient and family member/caregiver  PLAN FOR NEXT SESSION: connect the dots, trail making part A, matching/visual scanning games at tabletop, coordination Lt hand, Connect 4    Burnard Alan Roads, OT 01/27/2024, 8:04 AM

## 2024-01-27 NOTE — Patient Instructions (Signed)
 VISUAL SCANNING STRATEGIES   1. Look for the edge of objects (to the left and/or right) so that you make sure you are seeing all of an object   2. Turn your head when walking, scan from side to side, particularly in busy environments   3. Use an organized scanning pattern. It's usually easier to scan from top to bottom, and left to right (like you are reading)   4. Double check yourself   5. Use a line guide (like a blank piece of paper) or your finger when reading   6. If necessary, place brightly colored tape at end of table or work area as a reminder to always look until you see the tape.      Activities to try at home to encourage visual scanning:    1. Word searches 2. Mazes 3. Puzzles 4. Card games 5. Computer games and/or searches 6. Connect-the-dots   Activities for environmental (larger) scanning:    1. With supervision, scan for items in grocery store or drugstore.  Begin with a familiar store, then progress to a new store you've never been in before. Make sure you have supervision with this.     Coordination Activities  Perform the following activities for 10-15 minutes 1-2 times per day with left hand(s).  Toss ball between hands. Flip cards 1 at a time as fast as you can. Deal cards with your thumb (Hold deck in hand and push card off top with thumb). Rotate card in hand (clockwise and counter-clockwise). Pick up coins and stack. Twirl pen between fingers. Practice writing.

## 2024-02-01 ENCOUNTER — Ambulatory Visit: Admitting: Occupational Therapy

## 2024-02-01 DIAGNOSIS — R41842 Visuospatial deficit: Secondary | ICD-10-CM

## 2024-02-01 DIAGNOSIS — R2681 Unsteadiness on feet: Secondary | ICD-10-CM | POA: Diagnosis not present

## 2024-02-01 DIAGNOSIS — R278 Other lack of coordination: Secondary | ICD-10-CM

## 2024-02-01 DIAGNOSIS — R41840 Attention and concentration deficit: Secondary | ICD-10-CM

## 2024-02-01 DIAGNOSIS — M6281 Muscle weakness (generalized): Secondary | ICD-10-CM

## 2024-02-01 NOTE — Therapy (Signed)
 OUTPATIENT OCCUPATIONAL THERAPY NEURO TREATMENT  Patient Name: Alan Wise MRN: [id number] DOB:11-Aug-1970, 53 y.o., male Today's Date: 02/01/2024  PCP: Antonette Angeline ORN, NP  REFERRING PROVIDER: Pegge Toribio PARAS, PA-C  END OF SESSION:  OT End of Session - 02/01/24 0850     Visit Number 3    Number of Visits 16    Date for OT Re-Evaluation 03/22/24    Authorization Type [employer] 2025 OOPM MET    Authorization Time Period Covered 100% VL:MN Auth Not Reqd    OT Start Time 0845    OT Stop Time 0930    OT Time Calculation (min) 45 min    Activity Tolerance Patient tolerated treatment well    Behavior During Therapy Agitated;Impulsive          Past Medical History:  Diagnosis Date   Allergy    Complication of anesthesia    pt reports he is starting to have more difficulty arousing after surgery   Diabetes mellitus (HCC)    GERD (gastroesophageal reflux disease)    Headache    History of kidney stones    Hyperlipidemia    Renal disorder    kidney stones   Past Surgical History:  Procedure Laterality Date   APPLICATION OF CRANIAL NAVIGATION Right 01/17/2021   Procedure: APPLICATION OF CRANIAL NAVIGATION;  Surgeon: Debby Dorn MATSU, MD;  Location: [facility] OR;  Service: Neurosurgery;  Laterality: Right;   COLONOSCOPY  09/03/1994   COLONOSCOPY WITH PROPOFOL  N/A 01/07/2023   Procedure: COLONOSCOPY WITH PROPOFOL ;  Surgeon: Jinny Carmine, MD;  Location: [facility] ENDOSCOPY;  Service: Endoscopy;  Laterality: N/A;  NOT TOO EARLY   CYSTOSCOPY WITH STENT PLACEMENT Right 01/25/2016   Procedure: CYSTOSCOPY WITH STENT PLACEMENT;  Surgeon: Redell Lynwood Napoleon, MD;  Location: [facility] ORS;  Service: Urology;  Laterality: Right;   FRAMELESS  BIOPSY WITH BRAINLAB Right 01/17/2021   Procedure: RIGHT STEREOTACTIC BIOPSY OF INSULAR LESION;  Surgeon: Debby Dorn MATSU, MD;  Location: [facility] OR;  Service: Neurosurgery;  Laterality: Right;   KNEE ARTHROSCOPY W/ MENISCAL REPAIR Right 06/23/1988    POLYPECTOMY  01/07/2023   Procedure: POLYPECTOMY;  Surgeon: Jinny Carmine, MD;  Location: [facility] ENDOSCOPY;  Service: Endoscopy;;   removal of birthmark  06/08/2013   SKIN CANCER EXCISION  06/23/2012   TYMPANOSTOMY TUBE PLACEMENT  08/06/1978   URETEROSCOPY WITH HOLMIUM LASER LITHOTRIPSY Right 01/25/2016   Procedure: URETEROSCOPY WITH HOLMIUM LASER LITHOTRIPSY;  Surgeon: Redell Lynwood Napoleon, MD;  Location: [facility] ORS;  Service: Urology;  Laterality: Right;   URETHRAL STRICTURE DILATATION     visual inspection of vocal cord     1973   WISDOM TOOTH EXTRACTION     Patient Active Problem List   Diagnosis Date Noted   Nausea and vomiting 01/07/2024   Malnutrition of moderate degree 01/01/2024   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) 12/30/2023   Acute right ACA stroke (HCC) 12/24/2023   Acute CVA (cerebrovascular accident) (HCC) 12/18/2023   Psychomotor agitation 12/11/2023   Sepsis due to gram-negative UTI (HCC) 12/09/2023   Abnormal brain MRI 12/09/2023   Dyslipidemia 12/09/2023   Dysphagia 09/14/2023   Gait disorder 09/14/2023   Seizure disorder (HCC) 09/10/2023   History of CVA (cerebrovascular accident) 09/09/2023   Essential hypertension 09/09/2023   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 Pt did have occasional automatic appropriate responses.   Wife reports he had first stroke in March, possibly another smaller stroke, then this new stroke end of June with worsened symptoms. 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  Presented to the hospital 09/14/2023 with worsening gait left-sided weakness/aphasia and new onset dysphagia.  Hospitalized from 3-19 to 3-21 with CVA and breakthrough seizures.    PRECAUTIONS: Fall and Other: impulsive, decreased awareness, Lt visual field loss, seizures (recent one in beginning of July - 7th or 8th)   WEIGHT BEARING RESTRICTIONS: No  PAIN:  Are you having pain? No  FALLS: Has patient fallen in last 6 months? Yes. Number of falls 3  LIVING ENVIRONMENT: Lives with: lives with their family (wife, 33 y.o. dtr, 98 y.o. 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  HAND DOMINANCE: Left, but now using Rt hand  ADLs: Overall ADLs: Pt requires 24 hr supervison Transfers/ambulation related to ADLs: pt not using walker safety/correctly often lifting it Eating: mod I eating with Rt hand since March (first stroke), wife cuts food  Grooming: mod I with  Rt hand, wife trimming beard and/or shaving UB Dressing: independent LB Dressing: assist for Lt shoe and sock, and occasionally RT sock Toileting: independent Bathing: distant sup (wife reports she's not allowed in there - pt will not let her assist)  Tub Shower transfers: mod I  Equipment: none  IADLs: dep for all IADLS Community mobility: Has not driven since Nov 2024 d/t seizure, then more seizures, then first stroke Medication management: wife giving pt all medications Financial management: dep Handwriting: unable d/t weakness and aphasia but able to sign name only with assist from Lt hand  MOBILITY STATUS: using walker but often lifts it and does not use correctly   FUNCTIONAL OUTCOME MEASURES: TBA  UPPER EXTREMITY ROM:  RUE AROM WNLs LUE more limited at shoulder but distally WFLs   Active ROM Right eval Left eval  Shoulder flexion  90*  Shoulder abduction    Shoulder adduction    Shoulder extension    Shoulder internal rotation  WFL's  Shoulder external rotation  Approx 50%  Elbow flexion    Elbow extension    Wrist flexion    Wrist extension    Wrist ulnar deviation    Wrist radial deviation  Wrist pronation    Wrist supination    (Blank rows = not tested)    HAND FUNCTION: Grip strength: Right: 60.4 lbs; Left: 41.8 lbs  COORDINATION: Pt can pick up pen Lt hand but cannot manipulate (? Apraxia as well as decreased coordination) - pt has had decline in coordination since recent new stroke Box & Blocks: Lt = 9  SENSATION: Appears WFL's - unable to localize stimulation but ? If this is due to aphasia  EDEMA: none  MUSCLE TONE: LUE: Mild and Hypertonic  COGNITION: Overall cognitive status: Impaired and significantly impaired expressive aphasia, and moderate receptive aphasia. Pt easily frustrated trying to express himself. Pt also impulsive  VISION: Subjective report: per wife: vision has decreased more, but impaired prior to first stroke Baseline  vision: Wears glasses for reading only Visual history: changes in vision prior to CVA with necrosis from radiation  VISION ASSESSMENT: Impaired Visual Fields: Left homonymous hemianopsia  Decreased general awareness and attention also - especially to Lt side  PERCEPTION: Impaired: Inattention/neglect: does not attend to left visual field and does not attend to left side of body  PRAXIS: Impaired: Ideomotor  OBSERVATIONS: Lt side improving from first stroke, then recent stroke made it worse. Pt becomes very agitated/frustrated when he is unable to communicate what he wants to say and gets mad at spouse when she tries to speak (for clarification).                                                                                                                              TREATMENT DATE: 02/01/24  Pt performed trail making A 100% accurate in reasonable amount of time. Pt recognized numbers w/o difficulty. Pt then given Connect the dot activity (more complex) for visual scanning with no difficulty other than holding pencil in dominant Lt hand - had to switch to Rt due to coordination deficits on Lt.   Pt completing maze for visual scanning, planning ahead, problem solving with one self corrected error.   Pt playing Connect 4 with therapist demo good problem solving, visual scanning, and strategizing skills. Pt using Lt hand for coordination Pt did demo difficulty switching tasks from Connect 4 game to using checkers in strictly fine motor tasks for in hand manipulation (fingertips to/from palm)   Pt then tracing shapes and letters Lt hand for pre-writing skills with mod difficulty d/t coordination deficits  PATIENT EDUCATION: Education details: visual scanning strategies and activities, coordination HEP  Person educated: Patient and Spouse Education method: Explanation, Demonstration, Tactile cues, Verbal cues, and Handouts Education comprehension: wife verbalized understanding, returned  demonstration, verbal cues required, and needs further education  HOME EXERCISE PROGRAM: 01/27/24: visual scanning strategies and activities, coordination HEP    GOALS: Goals reviewed with patient? Yes   SHORT TERM GOALS: Target date: 02/20/24  Pt to be independent donning/doffing Lt shoe and both socks Baseline: Goal status: INITIAL  2.  Pt will be able to demo management  of anger when he gets frustrated Baseline:  Goal status: DEFERRED - pt is doing much better and no longer needed.  3.  Pt/family to verbalize understanding of visual scanning strategies and pt able to recall at least 2 compensatory strategies for visual impairment without cueing Baseline:  Goal status: IN PROGRESS  4.  Pt independent with HEP for LT shoulder ROM and Lt hand coordination Baseline:  Goal status: IN PROGRESS  5.  Pt will improve LUE function as evidenced by performing 15 blocks on Box & Blocks test Baseline: 9 blocks Goal status: INITIAL   LONG TERM GOALS: Target date: 03/22/24  Pt will be independent with updated HEP  Baseline:  Goal status: INITIAL  2.  Patient will return demonstration of visual adaptation use and verbalize understanding of how to obtain item(s) if desired.  Baseline:  Goal status: INITIAL  3.  Patient will demo improved FM coordination as evidenced by completing nine-hole peg with LUE use and assist from RUE prn in 60 sec or under Baseline: unable Lt hand Goal status: INITIAL  4.  Pt with improve LUE function as evidenced by performing 20 or more blocks on Box & Blocks Baseline: 9 blocks Goal status: INITIAL  5.  Pt to write name Lt hand with 90% legibility Baseline:  Goal status: INITIAL  6.  Pt will perform environmental scanning safely with min cues for 75% accuracy Baseline:  Goal status: INITIAL  ASSESSMENT:  CLINICAL IMPRESSION: Patient improved since evaluation w/ less frustration from expressive aphasia and slightly improved coordination and  automatic speech as well. Pt continues to benefit from reinforcement of visual scanning strategies and attending to Lt side.  Pt progressing towards some STG's.   PERFORMANCE DEFICITS: in functional skills including ADLs, IADLs, coordination, proprioception, tone, ROM, strength, Fine motor control, Gross motor control, mobility, balance, body mechanics, endurance, decreased knowledge of precautions, decreased knowledge of use of DME, vision, and UE functional use, cognitive skills including attention, perception, and safety awareness, and psychosocial skills including environmental adaptation.   IMPAIRMENTS: are limiting patient from ADLs, IADLs, rest and sleep, leisure, and social participation.   CO-MORBIDITIES: has co-morbidities such as oligodendroglioma, seizures that affects occupational performance. Patient will benefit from skilled OT to address above impairments and improve overall function.  MODIFICATION OR ASSISTANCE TO COMPLETE EVALUATION: Min-Moderate modification of tasks or assist with assess necessary to complete an evaluation.  OT OCCUPATIONAL PROFILE AND HISTORY: Detailed assessment: Review of records and additional review of physical, cognitive, psychosocial history related to current functional performance.  CLINICAL DECISION MAKING: Moderate - several treatment options, min-mod task modification necessary  REHAB POTENTIAL: Fair decreased insight/awareness into deficits, decreased attention to Lt side, aphasia  EVALUATION COMPLEXITY: Moderate    PLAN:  OT FREQUENCY: 2x/week  OT DURATION: 8 weeks  PLANNED INTERVENTIONS: 97535 self care/ADL training, 02889 therapeutic exercise, 97530 therapeutic activity, 97112 neuromuscular re-education, 97140 manual therapy, 97010 moist heat, 97129 Cognitive training (first 15 min), 02869 Cognitive training(each additional 15 min), 02239 Orthotic Initial, 97763 Orthotic/Prosthetic subsequent, passive range of motion, balance training,  functional mobility training, visual/perceptual remediation/compensation, energy conservation, coping strategies training, patient/family education, and DME and/or AE instructions  RECOMMENDED OTHER SERVICES: Pt scheduled for upcoming P.T. and SLP evaluations.   CONSULTED AND AGREED WITH PLAN OF CARE: Patient and family member/caregiver  PLAN FOR NEXT SESSION: 48 pc puzzle, environmental scanning w/ cues for head turns prn   Burnard JINNY Roads, OT 02/01/2024, 8:51 AM

## 2024-02-02 ENCOUNTER — Encounter: Payer: Self-pay | Admitting: Registered Nurse

## 2024-02-02 ENCOUNTER — Ambulatory Visit: Payer: Self-pay | Admitting: Speech Pathology

## 2024-02-02 ENCOUNTER — Encounter: Attending: Registered Nurse | Admitting: Registered Nurse

## 2024-02-02 VITALS — BP 133/80 | HR 55 | Ht 68.5 in | Wt 134.0 lb

## 2024-02-02 DIAGNOSIS — R2681 Unsteadiness on feet: Secondary | ICD-10-CM | POA: Diagnosis not present

## 2024-02-02 DIAGNOSIS — R4701 Aphasia: Secondary | ICD-10-CM

## 2024-02-02 DIAGNOSIS — G40909 Epilepsy, unspecified, not intractable, without status epilepticus: Secondary | ICD-10-CM | POA: Insufficient documentation

## 2024-02-02 DIAGNOSIS — R41841 Cognitive communication deficit: Secondary | ICD-10-CM

## 2024-02-02 DIAGNOSIS — I10 Essential (primary) hypertension: Secondary | ICD-10-CM | POA: Insufficient documentation

## 2024-02-02 DIAGNOSIS — I63521 Cerebral infarction due to unspecified occlusion or stenosis of right anterior cerebral artery: Secondary | ICD-10-CM | POA: Insufficient documentation

## 2024-02-02 NOTE — Progress Notes (Signed)
 Subjective:    Patient ID: Alan Wise, male    DOB: Dec 26, 1970, 53 y.o.   MRN: [id number]  HPI: Alan Wise is a 53 y.o. male who returns for follow up appointment for chronic pain and medication refill. states *** pain is located in  ***. rates pain ***. current exercise regime is walking and performing stretching exercises.     Pain Inventory  Average Pain 0 Pain Right Now 0 My pain is no pain  LOCATION OF PAIN  : weakness in left arm, left hand, left leg, left foot  BOWEL Number of stools per week: every 2 days  Oral laxative use No  Type of laxative None Enema or suppository use No  History of colostomy No  Incontinent No   BLADDER Normal    Mobility walk without assistance how many minutes can you walk? 20 minutes ability to climb steps?  yes do you drive?  no Do you have any goals in this area?  yes  Function disabled: date disabled applied I need assistance with the following:  meal prep, household duties, and shopping Do you have any goals in this area?  yes  Neuro/Psych weakness  Prior Studies Any changes since last visit?  no  Physicians involved in your care Any changes since last visit?  no   Family History  Problem Relation Age of Onset   Hyperlipidemia Father    Hypertension Father    Diabetes Father    Benign prostatic hyperplasia Father    Stroke Maternal Grandmother    Diabetes Maternal Grandmother    Stroke Paternal Grandmother    Hypertension Paternal Grandmother    Kidney disease Neg Hx    Prostate cancer Neg Hx    Social History   Socioeconomic History   Marital status: Married    Spouse name: Not on file   Number of children: Not on file   Years of education: Not on file   Highest education level: Not on file  Occupational History   Not on file  Tobacco Use   Smoking status: Never   Smokeless tobacco: Never  Vaping Use   Vaping status: Never Used  Substance and Sexual Activity   Alcohol use: Not  Currently    Comment: rare   Drug use: No   Sexual activity: Not Currently  Other Topics Concern   Not on file  Social History Narrative   Not on file   Social Drivers of Health   Financial Resource Strain: Low Risk  (12/05/2022)   Received from [facility]   Overall Financial Resource Strain (CARDIA)    Difficulty of Paying Living Expenses: Not hard at all  Food Insecurity: No Food Insecurity (12/18/2023)   Hunger Vital Sign    Worried About Running Out of Food in the Last Year: Never true    Ran Out of Food in the Last Year: Never true  Transportation Needs: No Transportation Needs (12/18/2023)   PRAPARE - Administrator, Civil Service (Medical): No    Lack of Transportation (Non-Medical): No  Physical Activity: Not on file  Stress: Not on file  Social Connections: Moderately Isolated (12/09/2023)   Social Connection and Isolation Panel    Frequency of Communication with Friends and Family: More than three times a week    Frequency of Social Gatherings with Friends and Family: More than three times a week    Attends Religious Services: Never    Database administrator or  prep, household duties, and shopping Do you have any goals in this area?  yes  Neuro/Psych weakness  Prior Studies Any changes since last visit?  no  Physicians involved in your care Any changes since last visit?  no   Family History  Problem Relation Age of Onset   Hyperlipidemia Father    Hypertension Father    Diabetes Father    Benign prostatic hyperplasia Father    Stroke Maternal  Grandmother    Diabetes Maternal Grandmother    Stroke Paternal Grandmother    Hypertension Paternal Grandmother    Kidney disease Neg Hx    Prostate cancer Neg Hx    Social History   Socioeconomic History   Marital status: Married    Spouse name: Not on file   Number of children: Not on file   Years of education: Not on file   Highest education level: Not on file  Occupational History   Not on file  Tobacco Use   Smoking status: Never   Smokeless tobacco: Never  Vaping Use   Vaping status: Never Used  Substance and Sexual Activity   Alcohol use: Not Currently    Comment: rare   Drug use: No   Sexual activity: Not Currently  Other Topics Concern   Not on file  Social History Narrative   Not on file   Social Drivers of Health   Financial Resource Strain: Low Risk  (12/05/2022)   Received from [facility]   Overall Financial Resource Strain (CARDIA)    Difficulty of Paying Living Expenses: Not hard at all  Food Insecurity: No Food Insecurity (12/18/2023)   Hunger Vital Sign    Worried About Running Out of Food in the Last Year: Never true    Ran Out of Food in the Last Year: Never true  Transportation Needs: No Transportation Needs (12/18/2023)   PRAPARE - Administrator, Civil Service (Medical): No    Lack of Transportation (Non-Medical): No  Physical Activity: Not on file  Stress: Not on file  Social Connections: Moderately Isolated (12/09/2023)   Social Connection and Isolation Panel    Frequency of Communication with Friends and Family: More than three times a week    Frequency of Social Gatherings with Friends and Family: More than three times a week    Attends Religious Services: Never    Database administrator or Organizations: No    Attends Banker Meetings: Never    Marital Status: Married   Past Surgical History:  Procedure Laterality Date   APPLICATION OF CRANIAL NAVIGATION Right 01/17/2021   Procedure:  APPLICATION OF CRANIAL NAVIGATION;  Surgeon: Debby Dorn MATSU, MD;  Location: [facility] OR;  Service: Neurosurgery;  Laterality: Right;   COLONOSCOPY  09/03/1994   COLONOSCOPY WITH PROPOFOL  N/A 01/07/2023   Procedure: COLONOSCOPY WITH PROPOFOL ;  Surgeon: Jinny Carmine, MD;  Location: [facility] ENDOSCOPY;  Service: Endoscopy;  Laterality: N/A;  NOT TOO EARLY   CYSTOSCOPY WITH STENT PLACEMENT Right 01/25/2016   Procedure: CYSTOSCOPY WITH STENT PLACEMENT;  Surgeon: Redell Lynwood Napoleon, MD;  Location: [facility] ORS;  Service: Urology;  Laterality: Right;   FRAMELESS  BIOPSY WITH BRAINLAB Right 01/17/2021   Procedure: RIGHT STEREOTACTIC BIOPSY OF INSULAR LESION;  Surgeon: Debby Dorn MATSU, MD;  Location: [facility] OR;  Service: Neurosurgery;  Laterality: Right;   KNEE ARTHROSCOPY W/ MENISCAL REPAIR Right 06/23/1988   POLYPECTOMY  01/07/2023   Procedure: POLYPECTOMY;  Surgeon: Jinny Carmine, MD;  Location: [facility]  ENDOSCOPY;  Service: Endoscopy;;   removal of birthmark  06/08/2013   SKIN CANCER EXCISION  06/23/2012   TYMPANOSTOMY TUBE PLACEMENT  08/06/1978   URETEROSCOPY WITH HOLMIUM LASER LITHOTRIPSY Right 01/25/2016   Procedure: URETEROSCOPY WITH HOLMIUM LASER LITHOTRIPSY;  Surgeon: Redell Lynwood Napoleon, MD;  Location: [facility] ORS;  Service: Urology;  Laterality: Right;   URETHRAL STRICTURE DILATATION     visual inspection of vocal cord     1973   WISDOM TOOTH EXTRACTION     Past Medical History:  Diagnosis Date   Allergy    Complication of anesthesia    pt reports he is starting to have more difficulty arousing after surgery   Diabetes mellitus (HCC)    GERD (gastroesophageal reflux disease)    Headache    History of kidney stones    Hyperlipidemia    Renal disorder    kidney stones   BP 133/80   Pulse (!) 55   Ht 5' 8.5 (1.74 m)   Wt 134 lb (60.8 kg)   SpO2 97%   BMI 20.08 kg/m   Opioid Risk Score:   Fall Risk Score:  `1  Depression screen PHQ 2/9     02/02/2024    1:58 PM 11/17/2023    1:27 PM  02/16/2023    9:47 AM 11/14/2022   10:26 AM 05/01/2022   10:19 AM 01/29/2022    8:29 AM 10/21/2021    2:46 PM  Depression screen PHQ 2/9  Decreased Interest 0 1 0 0 0 1 0  Down, Depressed, Hopeless 0 0 0 0 1 1 0  PHQ - 2 Score 0 1 0 0 1 2 0  Altered sleeping 0 0    2 3  Tired, decreased energy 0 2    3 2   Change in appetite 0 1    0 1  Feeling bad or failure about yourself  0 1    1 0  Trouble concentrating 0 2    1 1   Moving slowly or fidgety/restless 0 1    1 0  Suicidal thoughts 0 1    0 0  PHQ-9 Score 0 9    10 7   Difficult doing work/chores  Somewhat difficult    Extremely dIfficult Very difficult    Review of Systems  Constitutional:  Positive for unexpected weight change.       Weight loss  Eyes:  Positive for visual disturbance.  Neurological:  Positive for speech difficulty and weakness.  All other systems reviewed and are negative.      Objective:   Physical Exam Vitals and nursing note reviewed.  Constitutional:      Appearance: Normal appearance.  Cardiovascular:     Rate and Rhythm: Normal rate and regular rhythm.     Pulses: Normal pulses.     Heart sounds: Normal heart sounds.  Pulmonary:     Effort: Pulmonary effort is normal.     Breath sounds: Normal breath sounds.  Musculoskeletal:     Comments: Normal Muscle Bulk and Muscle Testing Reveals:  Upper Extremities: Right: Full ROM and Muscle Strength 5/5 Left Upper Extremity: Decreased ROM 45 Degrees and Muscle Strength 4/5 Lower Extremities: Full ROM and Muscle Strength 5/5 Arises From Table with Ease  Narrow Based  Gait     Skin:    General: Skin is warm and dry.  Neurological:     Mental Status: He is alert and oriented to person, place, and time.  Psychiatric:  Mood and Affect: Mood normal.        Behavior: Behavior normal.          Assessment & Plan:  Acute right ACA Stroke: Aphasia, : Dr. Buckley Following Following. Continue current medication regimen. Continue Outpatient Therapy at  [facility]. Continue to monitor.  Essential Hypertension: Continue current medication regimen. Continue to monitor.  Seizure Disorder: Continue current medication regimen. Dr. Buckley following. Continue to monitor.   F/ U with Dr Emeline in 4- 6 weeks

## 2024-02-02 NOTE — Therapy (Signed)
 OUTPATIENT SPEECH LANGUAGE PATHOLOGY APHASIA TREATMENT   Patient Name: Alan Wise MRN: [id number] DOB:1971-04-17, 53 y.o., male Today's Date: 02/02/2024  PCP: Angeline Laura, NP REFERRING PROVIDER: Toribio Pitch, PA-C  END OF SESSION:  End of Session - 02/02/24 0931     Visit Number 3    Number of Visits 25    Date for SLP Re-Evaluation 04/14/24    Authorization Type [employer]    Progress Note Due on Visit 10    SLP Start Time [phone]    SLP Stop Time  1015    SLP Time Calculation (min) 44 min    Activity Tolerance Patient tolerated treatment well           Past Medical History:  Diagnosis Date   Allergy    Complication of anesthesia    pt reports he is starting to have more difficulty arousing after surgery   Diabetes mellitus (HCC)    GERD (gastroesophageal reflux disease)    Headache    History of kidney stones    Hyperlipidemia    Renal disorder    kidney stones   Past Surgical History:  Procedure Laterality Date   APPLICATION OF CRANIAL NAVIGATION Right 01/17/2021   Procedure: APPLICATION OF CRANIAL NAVIGATION;  Surgeon: Debby Dorn MATSU, MD;  Location: [facility] OR;  Service: Neurosurgery;  Laterality: Right;   COLONOSCOPY  09/03/1994   COLONOSCOPY WITH PROPOFOL  N/A 01/07/2023   Procedure: COLONOSCOPY WITH PROPOFOL ;  Surgeon: Jinny Carmine, MD;  Location: [facility] ENDOSCOPY;  Service: Endoscopy;  Laterality: N/A;  NOT TOO EARLY   CYSTOSCOPY WITH STENT PLACEMENT Right 01/25/2016   Procedure: CYSTOSCOPY WITH STENT PLACEMENT;  Surgeon: Redell Lynwood Napoleon, MD;  Location: [facility] ORS;  Service: Urology;  Laterality: Right;   FRAMELESS  BIOPSY WITH BRAINLAB Right 01/17/2021   Procedure: RIGHT STEREOTACTIC BIOPSY OF INSULAR LESION;  Surgeon: Debby Dorn MATSU, MD;  Location: [facility] OR;  Service: Neurosurgery;  Laterality: Right;   KNEE ARTHROSCOPY W/ MENISCAL REPAIR Right 06/23/1988   POLYPECTOMY  01/07/2023   Procedure: POLYPECTOMY;  Surgeon: Jinny Carmine, MD;   Location: [facility] ENDOSCOPY;  Service: Endoscopy;;   removal of birthmark  06/08/2013   SKIN CANCER EXCISION  06/23/2012   TYMPANOSTOMY TUBE PLACEMENT  08/06/1978   URETEROSCOPY WITH HOLMIUM LASER LITHOTRIPSY Right 01/25/2016   Procedure: URETEROSCOPY WITH HOLMIUM LASER LITHOTRIPSY;  Surgeon: Redell Lynwood Napoleon, MD;  Location: [facility] ORS;  Service: Urology;  Laterality: Right;   URETHRAL STRICTURE DILATATION     visual inspection of vocal cord     1973   WISDOM TOOTH EXTRACTION     Patient Active Problem List   Diagnosis Date Noted   Nausea and vomiting 01/07/2024   Malnutrition of moderate degree 01/01/2024   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) 12/30/2023   Acute right ACA stroke (HCC) 12/24/2023   Acute CVA (cerebrovascular accident) (HCC) 12/18/2023   Psychomotor agitation 12/11/2023   Sepsis due to gram-negative UTI (HCC) 12/09/2023   Abnormal brain MRI 12/09/2023   Dyslipidemia 12/09/2023   Dysphagia 09/14/2023   Gait disorder 09/14/2023   Seizure disorder (HCC) 09/10/2023   History of CVA (cerebrovascular accident) 09/09/2023   Essential hypertension 09/09/2023   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   Type 2 diabetes mellitus with hyperlipidemia (HCC) 06/12/2014   Hyperlipidemia associated with type 2 diabetes mellitus (HCC) 06/12/2014    ONSET DATE: 01/14/24 referred   REFERRING DIAG: Acute right  ACA stroke   THERAPY DIAG: Aphasia  Cognitive communication deficit  Rationale for Evaluation and Treatment: Rehabilitation  SUBJECTIVE:   SUBJECTIVE STATEMENT: Opening conversation c/b vague speech, perseverations, and circumlocution Pt accompanied by: significant other  PERTINENT HISTORY: history of T2DM- A1c 5.4, GERD, renal calculi, 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 Dr. Buckley felt this was secondary to evolving radiation necrosis v/s novel small vessel infracts and was changed to Eliquis  and abilify   briefly added for ongoing behavorial issues.  He was readmitted on 12/18/23 with recurrent falls with left sided weakness. Repeat MRI brain done revaling new 3.2 X 1.3 cm focus of right basal ganglia infarct  and recent right temporal restricted diffusion no longer present. EEG was negative for seizures and showed cortical dysfunction arising from right fronto-temporal region likely due to underlying structural abnormality. Neurology recommended increasing Lamictal  dose for possible partial seizures.   He continued to have waxing and waning of baseline aphasia w/non verbal state at times and left sided weakness as well as left gaze preference with left hemianopia and left facial droop on 07/01 per exam by Dr. Michaela. 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 MRI brain repeated revealing progressive restricted diffusion mid and distal R-ACA territory infarct, ongoing multifocal right MCA deep white and gray matter unchanged from 06/27 and underlying chronic ischemic disease with encephalomalacia.  PAIN:  Are you having pain? No  FALLS: Has patient fallen in last 6 months?  Yes, Number of falls: 3  LIVING ENVIRONMENT: Lives with: lives with their family Lives in: House/apartment  PLOF:  Level of assistance: Independent with ADLs Employment: On disability  PATIENT GOALS: pt unable to state, spouse desires improved communication, decreased frustration  OBJECTIVE:  Note: Objective measures were completed at Evaluation unless otherwise noted.  Inpatient Rehab ST: Clinical Impression/Discharge Summary:  Pt made slight progress  this stay, as evidenced by mastery of 5/5 LTGs. Severe to profound expressive and moderate receptive aphasia remain at this time. He requires cues to clear L buccal cavity and maintain slow rate, but is tolerating D3 textures/thin liquids w/ minA. Frustration tolerance and receptiveness to education/cueing will continue to limit his overall progress, however, recommend cont ST upon d/c to target remaining deficits, maximize pt communication, and maximize pt independence.   COGNITION: Overall cognitive status: Difficulty to assess due to: Communication impairment  AUDITORY COMPREHENSION: Overall auditory comprehension: Impaired: simple, moderately complex, and complex YES/NO questions: Impaired: moderately complex and complex Following directions: Appears intact, single step. Did not attempt multi-step Conversation: Impaired, Simple Interfering components: attention, processing speed, and working memory Effective technique: extra processing time, repetition/stressing words, slowed speech, and stressing words  READING COMPREHENSION: Impaired: L neglect  EXPRESSION: verbal  VERBAL EXPRESSION: Level of generative/spontaneous verbalization: conversation Automatic speech: name: impaired, social response: impaired, counting: intact, and day of week: impaired  Repetition: Impaired: Word Naming: Confrontation: 0% accuracy Pragmatics: Impaired: abnormal effect, topic appropriateness, topic maintenance, and turn taking Interfering components: auditory comprehension, attention  Effective technique: sentence completion, phonemic cues, and articulatory cues Non-verbal means of communication: none  WRITTEN EXPRESSION: Dominant hand: right Written expression: Impaired: word  MOTOR SPEECH: Overall motor speech: Appears intact  ORAL MOTOR EXAMINATION: Overall  status: Impaired:   Lingual: Left (ROM and Symmetry)  STANDARDIZED ASSESSMENTS: QAB severe impairment Word comprehension 10.00  Sentence  comprehension 0.83  Word finding 0.00  Grammatical construction 2.00  Speech motor programming 10.00  Repetition 0.00  Reading 7.92  QAB overall 4.02   PATIENT REPORTED OUTCOME MEASURES (PROM): Deferred d/t severity of impairment                                                                                                                          TREATMENT DATE:  02/02/24: Utilize interest and then tolerate to generate list of salient topics for future SGD customization.  Patient identifies the following: Eating out, family, food and meals, movies and TV, news and current events, personal needs, pets, sports, conflict.  For food and meal patient would benefit from folder dedicated to getting instruction to wife for preparation of preferred meals, including ingredients.  For support patient would benefit from folder for conversation with kids regarding their sports involvement including soccer T-ball and basketball.  Utilizing [facility] board and writing as multimodal communication patient correctly answered 4 of 5 personal questions.  ABC board provided for use at home as patient continues to benefit from first letter cues for anomia repair.   01/26/24: Given significant aphasia, trialed multimodal communication today, including writing and [employer] TouchTalk device. Pt able to write wife's name with min error to augment naming. Otherwise, writing inconsistent with perseverations and agraphia. Demonstrated how to functionally utilize TouchTalk device for naming preferred foods items. Good oral reading accuracy exhibited. Able to ID preferred choice f/o 3 with good accuracy. Generated AAC categories related to his children. Required usual max A to name interests of children. Cued gestures were beneficial x2. Pt and wife interested in pursuing SGD trials, with SLP submitting information this session.  01/21/24: Explained rationale for speech generating device recommendation. Discussed benefits of increased  independence with communication and to support rehabilitation. Discussed severity of impairment and prior level of functioning supporting likelihood of slow rehab course, and benefit of SGD for improved communication while rehabilitating. Pt does not desire AAC at this time   PATIENT EDUCATION: Education details: POC/goals Person educated: Patient and Spouse Education method: Explanation Education comprehension: verbalized understanding and needs further education   GOALS: Goals reviewed with patient? Yes  SHORT TERM GOALS: Target date: 03/17/2024  Patient will use multimodal communication to communicate basic wants and needs with rare min-A Baseline: Goal status: ONGOING  2.  Patient and caregivers will generate x10 functional home-based targets for low or high tech AAC system  Baseline:  Goal status: ONGOING  3.  Patient will complete word level structured language tasks with mod-A  Baseline:  Goal status: ONGOING  4.  Pt will demonstrate accurate gesture for given target with 80% accuracy  Baseline:  Goal status: ONGOING   LONG TERM GOALS: Target date: 04/14/2024  Pt will use multimodal communication to communicate preferences in simple conversation  Baseline:  Goal status: ONGOING  2.  Pt and caregivers will carryover 3 compensatory strategies to support pt's auditory comprehension  Baseline:  Goal status: ONGOING  3.  Pt and caregiver will demonstrate supportive communication techniques in conversation to support pt expressive language Baseline:  Goal status: ONGOING  4.  Pt and spouse will carryover 1-2 language enhancing activities at home daily 5/7 days  Baseline:  Goal status: ONGOING  5.  Pt will generate moderately complex sentences in structured task such as VNeST with usual min A  Baseline:  Goal status: ONGOING   ASSESSMENT:  CLINICAL IMPRESSION: Patient is a 53 y.o. M who was seen today for aphasia tx. Known to clinic from prior ST course, since  has had another stroke. Communication impairment during prior ST course was severe, profound impairment at this time. Pt presenting with fluent aphsia c/b nonsensical speech that lacks meaning, tangential speech, usual use of a few rote phrases repetitively (the thing, the thing, that which is). During picture description task, pt says that, that, that with no content.  Benefit observed from phonemic and phrase completion cues to aid in anomia recovery.  Patient unable to repeat, challenges with answering yes no questions.  Per spouse primary means of communication was asking yes/no questions prior to most recent stroke, this new impairment has significantly impacted her communication and skills in the home environment.  Patient appears to lack awareness, he typically responds to questions with a long-winded nonsensical speech requiring SLP cues to stop.  Due to severity of impairment and prior level of function I highly recommend implementation of a speech generating device.  At this time patient declines will continue to address in future therapy sessions.  Spouse appears receptive to idea of communication support.   OBJECTIVE IMPAIRMENTS: include attention, awareness, executive functioning, and aphasia. These impairments are limiting patient from effectively communicating at home and in community. Factors affecting potential to achieve goals and functional outcome are previous level of function and severity of impairments. Patient will benefit from skilled SLP services to address above impairments and improve overall function.  REHAB POTENTIAL: Fair (severity of impairments)  PLAN:  SLP FREQUENCY: 2x/week  SLP DURATION: 12 weeks  PLANNED INTERVENTIONS: Language facilitation, Cueing hierachy, Internal/external aids, Functional tasks, Multimodal communication approach, SLP instruction and feedback, Compensatory strategies, and Patient/family education    Harlene LITTIE Ned, CCC-SLP 02/02/2024,  9:31 AM

## 2024-02-03 ENCOUNTER — Ambulatory Visit: Admitting: Occupational Therapy

## 2024-02-03 ENCOUNTER — Ambulatory Visit

## 2024-02-03 ENCOUNTER — Encounter: Payer: Self-pay | Admitting: Occupational Therapy

## 2024-02-03 DIAGNOSIS — R41840 Attention and concentration deficit: Secondary | ICD-10-CM

## 2024-02-03 DIAGNOSIS — M6281 Muscle weakness (generalized): Secondary | ICD-10-CM

## 2024-02-03 DIAGNOSIS — I6300 Cerebral infarction due to thrombosis of unspecified precerebral artery: Secondary | ICD-10-CM

## 2024-02-03 DIAGNOSIS — R278 Other lack of coordination: Secondary | ICD-10-CM

## 2024-02-03 DIAGNOSIS — R41842 Visuospatial deficit: Secondary | ICD-10-CM

## 2024-02-03 DIAGNOSIS — R2681 Unsteadiness on feet: Secondary | ICD-10-CM

## 2024-02-03 DIAGNOSIS — R4701 Aphasia: Secondary | ICD-10-CM

## 2024-02-03 NOTE — Therapy (Signed)
 OUTPATIENT OCCUPATIONAL THERAPY NEURO TREATMENT  Patient Name: Alan Wise MRN: [id number] DOB:[birth year], 53 y.o., male Today's Date: 02/03/2024  PCP: Antonette Angeline ORN, NP  REFERRING PROVIDER: Pegge Toribio PARAS, PA-C  END OF SESSION:  OT End of Session - 02/03/24 0801     Visit Number 4    Number of Visits 16    Date for OT Re-Evaluation 03/22/24    Authorization Type [employer] 2025 OOPM MET    Authorization Time Period Covered 100% VL:MN Auth Not Reqd    OT Start Time 0800    OT Stop Time 0845    OT Time Calculation (min) 45 min    Activity Tolerance Patient tolerated treatment well    Behavior During Therapy Agitated;Impulsive          Past Medical History:  Diagnosis Date   Allergy    Complication of anesthesia    pt reports he is starting to have more difficulty arousing after surgery   Diabetes mellitus (HCC)    GERD (gastroesophageal reflux disease)    Headache    History of kidney stones    Hyperlipidemia    Renal disorder    kidney stones   Past Surgical History:  Procedure Laterality Date   APPLICATION OF CRANIAL NAVIGATION Right 01/17/2021   Procedure: APPLICATION OF CRANIAL NAVIGATION;  Surgeon: Debby Dorn MATSU, MD;  Location: [facility] OR;  Service: Neurosurgery;  Laterality: Right;   COLONOSCOPY  09/03/1994   COLONOSCOPY WITH PROPOFOL  N/A 01/07/2023   Procedure: COLONOSCOPY WITH PROPOFOL ;  Surgeon: Jinny Carmine, MD;  Location: [facility] ENDOSCOPY;  Service: Endoscopy;  Laterality: N/A;  NOT TOO EARLY   CYSTOSCOPY WITH STENT PLACEMENT Right 01/25/2016   Procedure: CYSTOSCOPY WITH STENT PLACEMENT;  Surgeon: Redell Lynwood Napoleon, MD;  Location: [facility] ORS;  Service: Urology;  Laterality: Right;   FRAMELESS  BIOPSY WITH BRAINLAB Right 01/17/2021   Procedure: RIGHT STEREOTACTIC BIOPSY OF INSULAR LESION;  Surgeon: Debby Dorn MATSU, MD;  Location: [facility] OR;  Service: Neurosurgery;  Laterality: Right;   KNEE ARTHROSCOPY W/ MENISCAL REPAIR Right 06/23/1988    POLYPECTOMY  01/07/2023   Procedure: POLYPECTOMY;  Surgeon: Jinny Carmine, MD;  Location: [facility] ENDOSCOPY;  Service: Endoscopy;;   removal of birthmark  06/08/2013   SKIN CANCER EXCISION  06/23/2012   TYMPANOSTOMY TUBE PLACEMENT  08/06/1978   URETEROSCOPY WITH HOLMIUM LASER LITHOTRIPSY Right 01/25/2016   Procedure: URETEROSCOPY WITH HOLMIUM LASER LITHOTRIPSY;  Surgeon: Redell Lynwood Napoleon, MD;  Location: [facility] ORS;  Service: Urology;  Laterality: Right;   URETHRAL STRICTURE DILATATION     visual inspection of vocal cord     1973   WISDOM TOOTH EXTRACTION     Patient Active Problem List   Diagnosis Date Noted   Nausea and vomiting 01/07/2024   Malnutrition of moderate degree 01/01/2024   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) 12/30/2023   Acute right ACA stroke (HCC) 12/24/2023   Acute CVA (cerebrovascular accident) (HCC) 12/18/2023   Psychomotor agitation 12/11/2023   Sepsis due to gram-negative UTI (HCC) 12/09/2023   Abnormal brain MRI 12/09/2023   Dyslipidemia 12/09/2023   Dysphagia 09/14/2023   Gait disorder 09/14/2023   Seizure disorder (HCC) 09/10/2023   History of CVA (cerebrovascular accident) 09/09/2023   Essential hypertension 09/09/2023   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 Has P.T. appt following  Pt aphasic - verbal but unable to communicate what he wanted. Pt did have occasional automatic appropriate responses.   Wife reports he had first stroke in March, possibly another smaller stroke, then this new stroke end of June with worsened symptoms. 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  Presented to the hospital 09/14/2023 with worsening gait left-sided weakness/aphasia and new onset dysphagia.  Hospitalized from 3-19 to 3-21 with CVA and breakthrough seizures.    PRECAUTIONS: Fall and Other: impulsive, decreased awareness, Lt visual field loss, seizures (recent one in beginning of July - 7th or 8th)   WEIGHT BEARING RESTRICTIONS: No  PAIN:  Are you having pain? No  FALLS: Has patient fallen in last 6 months? Yes. Number of falls 3  LIVING ENVIRONMENT: Lives with: lives with their family (wife, 31 y.o. dtr, 32 y.o. 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  HAND DOMINANCE: Left, but now using Rt hand  ADLs: Overall ADLs: Pt requires 24 hr supervison Transfers/ambulation related to ADLs: pt not using walker safety/correctly often lifting it Eating: mod I eating with Rt hand since March (first stroke), wife cuts food  Grooming: mod I with  Rt hand, wife trimming beard and/or shaving UB Dressing: independent LB Dressing: assist for Lt shoe and sock, and occasionally RT sock Toileting: independent Bathing: distant sup (wife reports she's not allowed in there - pt will not let her assist)  Tub Shower transfers: mod I  Equipment: none  IADLs: dep for all IADLS Community mobility: Has not driven since Nov 2024 d/t seizure, then more seizures, then first stroke Medication management: wife giving pt all medications Financial management: dep Handwriting: unable d/t weakness and aphasia but able to sign name only with assist from Lt hand  MOBILITY STATUS: using walker but often lifts it and does not use correctly   FUNCTIONAL OUTCOME MEASURES: TBA  UPPER EXTREMITY ROM:  RUE AROM WNLs LUE more limited at shoulder but distally WFLs   Active ROM Right eval Left eval  Shoulder flexion  90*  Shoulder abduction    Shoulder adduction    Shoulder extension    Shoulder internal rotation  WFL's  Shoulder external rotation  Approx 50%  Elbow flexion    Elbow extension    Wrist flexion    Wrist extension    Wrist ulnar deviation    Wrist radial deviation  Wrist pronation    Wrist supination    (Blank rows = not tested)    HAND FUNCTION: Grip strength: Right: 60.4 lbs; Left: 41.8 lbs  COORDINATION: Pt can pick up pen Lt hand but cannot manipulate (? Apraxia as well as decreased coordination) - pt has had decline in coordination since recent new stroke Box & Blocks: Lt = 9  SENSATION: Appears WFL's - unable to localize stimulation but ? If this is due to aphasia  EDEMA: none  MUSCLE TONE: LUE: Mild and Hypertonic  COGNITION: Overall cognitive status: Impaired and significantly impaired expressive aphasia, and moderate receptive aphasia. Pt easily frustrated trying to express himself. Pt also impulsive  VISION: Subjective report: per wife: vision has decreased more, but impaired prior to first stroke Baseline  vision: Wears glasses for reading only Visual history: changes in vision prior to CVA with necrosis from radiation  VISION ASSESSMENT: Impaired Visual Fields: Left homonymous hemianopsia  Decreased general awareness and attention also - especially to Lt side  PERCEPTION: Impaired: Inattention/neglect: does not attend to left visual field and does not attend to left side of body  PRAXIS: Impaired: Ideomotor  OBSERVATIONS: Lt side improving from first stroke, then recent stroke made it worse. Pt becomes very agitated/frustrated when he is unable to communicate what he wants to say and gets mad at spouse when she tries to speak (for clarification).                                                                                                                              TREATMENT DATE: 02/03/24  Pt assembling 48 pc puzzle using Lt hand to pick up pieces for coordination with max difficulty, extra time. Pt favoring/attending only to Rt side of puzzle and requires mod cues to attend to Lt side of puzzle. Pt finished all pcs on Rt before looking to Lt side of table for pcs. Pt also required mod assist to complete puzzle. Pt did demo some simple problem solving (sky at top, feet of horse at bottom, etc) but overall significantly decreased spatial reasoning and attention to Lt side of puzzle and table. Pt only attended to Lt side, once no other choices remained.  Pt required 37 min to complete.  Environmental scanning in quiet gym - pt found 8/13 items (61.5% accuracy) on first pass, missing 5 items (4 of which were on Lt side)    PATIENT EDUCATION: Education details: visual scanning strategies and activities, coordination HEP  Person educated: Patient and Spouse Education method: Explanation, Demonstration, Tactile cues, Verbal cues, and Handouts Education comprehension: wife verbalized understanding, returned demonstration, verbal cues required, and needs further education  HOME EXERCISE  PROGRAM: 01/27/24: visual scanning strategies and activities, coordination HEP    GOALS: Goals reviewed with patient? Yes   SHORT TERM GOALS: Target date: 02/20/24  Pt to be independent donning/doffing Lt shoe and both socks Baseline: Goal status: INITIAL  2.  Pt will be able to  demo management of anger when he gets frustrated Baseline:  Goal status: DEFERRED - pt is doing much better and no longer needed.  3.  Pt/family to verbalize understanding of visual scanning strategies and pt able to recall at least 2 compensatory strategies for visual impairment without cueing Baseline:  Goal status: IN PROGRESS  4.  Pt independent with HEP for LT shoulder ROM and Lt hand coordination Baseline:  Goal status: IN PROGRESS  5.  Pt will improve LUE function as evidenced by performing 15 blocks on Box & Blocks test Baseline: 9 blocks Goal status: INITIAL   LONG TERM GOALS: Target date: 03/22/24  Pt will be independent with updated HEP  Baseline:  Goal status: INITIAL  2.  Patient will return demonstration of visual adaptation use and verbalize understanding of how to obtain item(s) if desired.  Baseline:  Goal status: INITIAL  3.  Patient will demo improved FM coordination as evidenced by completing nine-hole peg with LUE use and assist from RUE prn in 60 sec or under Baseline: unable Lt hand Goal status: INITIAL  4.  Pt with improve LUE function as evidenced by performing 20 or more blocks on Box & Blocks Baseline: 9 blocks Goal status: INITIAL  5.  Pt to write name Lt hand with 90% legibility Baseline:  Goal status: INITIAL  6.  Pt will perform environmental scanning safely with min cues for 75% accuracy Baseline:  Goal status: INITIAL  ASSESSMENT:  CLINICAL IMPRESSION: Patient with slightly more increased frustration today as therapist would try and assist cueing patient for puzzle. Pt did not seem receptive to recommendations and waited until very end of puzzle before  attending to Lt side.   PERFORMANCE DEFICITS: in functional skills including ADLs, IADLs, coordination, proprioception, tone, ROM, strength, Fine motor control, Gross motor control, mobility, balance, body mechanics, endurance, decreased knowledge of precautions, decreased knowledge of use of DME, vision, and UE functional use, cognitive skills including attention, perception, and safety awareness, and psychosocial skills including environmental adaptation.   IMPAIRMENTS: are limiting patient from ADLs, IADLs, rest and sleep, leisure, and social participation.   CO-MORBIDITIES: has co-morbidities such as oligodendroglioma, seizures that affects occupational performance. Patient will benefit from skilled OT to address above impairments and improve overall function.  MODIFICATION OR ASSISTANCE TO COMPLETE EVALUATION: Min-Moderate modification of tasks or assist with assess necessary to complete an evaluation.  OT OCCUPATIONAL PROFILE AND HISTORY: Detailed assessment: Review of records and additional review of physical, cognitive, psychosocial history related to current functional performance.  CLINICAL DECISION MAKING: Moderate - several treatment options, min-mod task modification necessary  REHAB POTENTIAL: Fair decreased insight/awareness into deficits, decreased attention to Lt side, aphasia  EVALUATION COMPLEXITY: Moderate    PLAN:  OT FREQUENCY: 2x/week  OT DURATION: 8 weeks  PLANNED INTERVENTIONS: 97535 self care/ADL training, 02889 therapeutic exercise, 97530 therapeutic activity, 97112 neuromuscular re-education, 97140 manual therapy, 97010 moist heat, 97129 Cognitive training (first 15 min), 02869 Cognitive training(each additional 15 min), 02239 Orthotic Initial, 97763 Orthotic/Prosthetic subsequent, passive range of motion, balance training, functional mobility training, visual/perceptual remediation/compensation, energy conservation, coping strategies training, patient/family  education, and DME and/or AE instructions  RECOMMENDED OTHER SERVICES: Pt scheduled for upcoming P.T. and SLP evaluations.   CONSULTED AND AGREED WITH PLAN OF CARE: Patient and family member/caregiver  PLAN FOR NEXT SESSION: continue visual scanning activities - puzzle, environmental scanning, etc.    Burnard JINNY Roads, OT 02/03/2024, 8:02 AM

## 2024-02-03 NOTE — Therapy (Signed)
 OUTPATIENT PHYSICAL THERAPY NEURO TREATMENT NOTE   Patient Name: Alan Wise MRN: [id number] DOB:[birth year], 53 y.o., male Today's Date: 02/03/2024   PCP: Angeline Laura, NP REFERRING PROVIDER: Toribio Pitch, PA-C  END OF SESSION:  PT End of Session - 02/03/24 0914     Visit Number 2    Number of Visits 5    Date for PT Re-Evaluation 02/22/24    Authorization Type [employer]    PT Start Time 0845    PT Stop Time 0930    PT Time Calculation (min) 45 min    Equipment Utilized During Treatment Gait belt    Activity Tolerance Patient tolerated treatment well    Behavior During Therapy WFL for tasks assessed/performed          Past Medical History:  Diagnosis Date   Allergy    Complication of anesthesia    pt reports he is starting to have more difficulty arousing after surgery   Diabetes mellitus (HCC)    GERD (gastroesophageal reflux disease)    Headache    History of kidney stones    Hyperlipidemia    Renal disorder    kidney stones   Past Surgical History:  Procedure Laterality Date   APPLICATION OF CRANIAL NAVIGATION Right 01/17/2021   Procedure: APPLICATION OF CRANIAL NAVIGATION;  Surgeon: Debby Dorn MATSU, MD;  Location: [facility] OR;  Service: Neurosurgery;  Laterality: Right;   COLONOSCOPY  09/03/1994   COLONOSCOPY WITH PROPOFOL  N/A 01/07/2023   Procedure: COLONOSCOPY WITH PROPOFOL ;  Surgeon: Jinny Carmine, MD;  Location: [facility] ENDOSCOPY;  Service: Endoscopy;  Laterality: N/A;  NOT TOO EARLY   CYSTOSCOPY WITH STENT PLACEMENT Right 01/25/2016   Procedure: CYSTOSCOPY WITH STENT PLACEMENT;  Surgeon: Redell Lynwood Napoleon, MD;  Location: [facility] ORS;  Service: Urology;  Laterality: Right;   FRAMELESS  BIOPSY WITH BRAINLAB Right 01/17/2021   Procedure: RIGHT STEREOTACTIC BIOPSY OF INSULAR LESION;  Surgeon: Debby Dorn MATSU, MD;  Location: [facility] OR;  Service: Neurosurgery;  Laterality: Right;   KNEE ARTHROSCOPY W/ MENISCAL REPAIR Right 06/23/1988   POLYPECTOMY   01/07/2023   Procedure: POLYPECTOMY;  Surgeon: Jinny Carmine, MD;  Location: [facility] ENDOSCOPY;  Service: Endoscopy;;   removal of birthmark  06/08/2013   SKIN CANCER EXCISION  06/23/2012   TYMPANOSTOMY TUBE PLACEMENT  08/06/1978   URETEROSCOPY WITH HOLMIUM LASER LITHOTRIPSY Right 01/25/2016   Procedure: URETEROSCOPY WITH HOLMIUM LASER LITHOTRIPSY;  Surgeon: Redell Lynwood Napoleon, MD;  Location: [facility] ORS;  Service: Urology;  Laterality: Right;   URETHRAL STRICTURE DILATATION     visual inspection of vocal cord     1973   WISDOM TOOTH EXTRACTION     Patient Active Problem List   Diagnosis Date Noted   Nausea and vomiting 01/07/2024   Malnutrition of moderate degree 01/01/2024   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) 12/30/2023   Acute right ACA stroke (HCC) 12/24/2023   Acute CVA (cerebrovascular accident) (HCC) 12/18/2023   Psychomotor agitation 12/11/2023   Sepsis due to gram-negative UTI (HCC) 12/09/2023   Abnormal brain MRI 12/09/2023   Dyslipidemia 12/09/2023   Dysphagia 09/14/2023   Gait disorder 09/14/2023   Seizure disorder (HCC) 09/10/2023   History of CVA (cerebrovascular accident) 09/09/2023   Essential hypertension 09/09/2023   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   Type 2 diabetes mellitus with hyperlipidemia (HCC) 06/12/2014   Hyperlipidemia associated with type 2 diabetes mellitus (HCC) 06/12/2014    ONSET  DATE: 10/18/23  REFERRING DIAG: acute right ACA stroke  THERAPY DIAG:  Muscle weakness (generalized)  Unsteadiness on feet  Cerebrovascular accident (CVA) due to thrombosis of precerebral artery (HCC)  Rationale for Evaluation and Treatment: Rehabilitation  SUBJECTIVE:                                                                                                                                                                                              SUBJECTIVE STATEMENT: Pt presented byhimself. No new complaints of fall. Pt accompanied by: self  PERTINENT HISTORY: PMHx: T2DM, GERD, CVA w/ L sided weakness, oligodendroglioma  PAIN:  Are you having pain? No  PRECAUTIONS: Fall  RED FLAGS: None   WEIGHT BEARING RESTRICTIONS: No  FALLS: Has patient fallen in last 6 months? Yes. Number of falls 1  LIVING ENVIRONMENT: Lives with: lives with their spouse Lives in: House/apartment Stairs: Yes: Internal: 13 steps Has following equipment at home: Walker - 2 wheeled and None  PLOF: Needs assistance with homemaking  PATIENT GOALS: improve balance  OBJECTIVE:  Note: Objective measures were completed at Evaluation unless otherwise noted.  DIAGNOSTIC FINDINGS: 01/15/24 IMPRESSION: 1. Interval evolution of the nonhemorrhagic infarcts involving the right single lid gyrus, right basal ganglia and periventricular white matter and left lentiform nucleus. 2. Stable appearance of right frontal lobe previously treated neoplasm, with hemosiderin staining from prior hemorrhage and surrounding gliosis. No adverse interval change.  COGNITION: Overall cognitive status: Difficulty to assess due to: Communication impairment    LOWER EXTREMITY ROM:     Active  Right Eval Left Eval  Hip flexion    Hip extension    Hip abduction    Hip adduction    Hip internal rotation    Hip external rotation    Knee flexion    Knee extension    Ankle dorsiflexion    Ankle plantarflexion    Ankle inversion    Ankle eversion     (Blank rows = not tested)  LOWER EXTREMITY MMT:    MMT Right Eval Left Eval  Hip flexion    Hip extension    Hip abduction    Hip adduction    Hip internal rotation    Hip external rotation    Knee flexion    Knee extension    Ankle dorsiflexion 5/5 5/5  Ankle plantarflexion    Ankle inversion    Ankle eversion    (Blank rows = not tested) GAIT: Findings: Gait Characteristics: poor foot clearance- Right  and poor foot clearance- Left, Distance walked: 230', and Comments: Pt has occasional steppage gait which can occur left or  right foot and is not due to foot drop. According to patient he has always done that since he was young (before his 2 strokes and chemo/radiation treatments). Pt denies falls from this steppage gait.  FUNCTIONAL TESTS:  5 times sit to stand: 11.20 sec (01/25/24) 10 meter walk test: 1.1 m/s without AD (01/25/24) Functional gait assessment:  FUNCTIONAL GAIT ASSESSMENT  Date: 01/25/24 Score  GAIT LEVEL SURFACE Instructions: Walk at your normal speed from here to the next mark (6 m) [20 ft]. (3) Normal - Walks 6 m (20 ft) in less than 5.5 seconds, no assistive devices, good speed, no evidence for imbalance, normal gait pattern, deviates no more than 15.24 cm (6 in) outside of the 30.48-cm (12-in) walkway width.  2.   CHANGE IN GAIT SPEED Instructions: Begin walking at your normal pace (for 1.5 m [5 ft]). When I tell you "go," walk as fast as you can (for 1.5 m [5 ft]). When I tell you "slow," walk as slowly as you can (for 1.5 m [5 ft]. (3) Normal - Able to smoothly change walking speed without loss of balance or gait deviation. Shows a significant difference in walking speeds between normal, fast, and slow speeds. Deviates no more than 15.24 cm (6 in) outside of the 30.48-cm (12-in) walkway width.  3.    GAIT WITH HORIZONTAL HEAD TURNS Instructions: Walk from here to the next mark 6 m (20 ft) away. Begin walking at your normal pace. Keep walking straight; after 3 steps, turn your head to the right and keep walking straight while looking to the right. After 3 more steps, turn your head to the left and keep walking straight while looking left. Continue alternating looking right and left. (2) Mild impairment - Performs head turns smoothly with slight change in gait velocity (eg, minor disruption to smooth gait path), deviates 15.24 -25.4 cm (6 -10 in) outside 30.48-cm (12-in) walkway width, or  uses an assistive device.  4.   GAIT WITH VERTICAL HEAD TURNS Instructions: Walk from here to the next mark (6 m [20 ft]). Begin walking at your normal pace. Keep walking straight; after 3 steps, tip your head up and keep walking straight while looking up. After 3 more steps, tip your head down, keep walking straight while looking down. Continue  alternating looking up and down every 3 steps until you have completed 2 repetitions in each direction. (3) Normal - Performs head turns with no change in gait. Deviates no more than 15.24 cm (6 in) outside 30.48-cm (12-in) walkway width.  5.  GAIT AND PIVOT TURN Instructions: Begin with walking at your normal pace. When I tell you, "turn and stop," turn as quickly as you can to face the opposite direction and stop. (3) Normal - Pivot turns safely within 3 seconds and stops quickly with no loss of balance  6.   STEP OVER OBSTACLE Instructions: Begin walking at your normal speed. When you come to the shoe box, step over it, not around it, and keep walking. (3) Normal - Is able to step over 2 stacked shoe boxes taped together (22.86 cm [9 in] total height) without changing gait speed; no evidence of imbalance.  7.   GAIT WITH NARROW BASE OF SUPPORT Instructions: Walk on the floor with arms folded across the chest, feet aligned heel to toe in tandem for a distance of 3.6 m [12 ft]. The number of steps taken in a straight line are counted for a maximum of 10 steps. (1)  Moderate impairment - Ambulates 4 -7 steps.  8.   GAIT WITH EYES CLOSED Instructions: Walk at your normal speed from here to the next mark (6 m [20 ft]) with your eyes closed. (3) Normal - Walks 6 m (20 ft), no assistive devices, good speed, no evidence of imbalance, normal gait pattern, deviates no more than 15.24 cm (6 in) outside 30.48-cm (12-in) walkway width. Ambulates 6 m (20 ft) in less than 7 seconds.  9.   AMBULATING BACKWARDS Instructions: Walk backwards until I tell you to stop (1) Moderate  impairment - Walks 6 m (20 ft), slow speed, abnormal gait pattern, evidence for imbalance, deviates 25.4 -38.1 cm (10 -15 in) outside 30.48-cm (12-in) walkway width.  10. STEPS Instructions: Walk up these stairs as you would at home (ie, using the rail if necessary). At the top turn around and walk down. (1) Moderate impairment-Two feet to a stair; must use rail.  Total 24/30   Interpretation of scores: Non-Specific Older Adults Cutoff Score: <=22/30 = risk of falls Parkinson's Disease Cutoff score <15/30= fall risk (Hoehn & Yahr 1-4)  Minimally Clinically Important Difference (MCID)  Stroke (acute, subacute, and chronic) = MDC: 4.2 points Vestibular (acute) = MDC: 6 points Community Dwelling Older Adults =  MCID: 4 points Parkinson's Disease  =  MDC: 4.3 points  ([employer]y [employer] (nd). Functional Gait Assessment. Retrieved from https://www.neuropt.org/docs/default-source/cpgs/core-outcome-measures/function-gait-assessment-pocket-guide-proof9-(2).pdf?sfvrsn=b28f35043_0.)D                                                                                                                              TREATMENT DATE:   02/03/24  Stepping over yard stick with black sport cord (2 ropes) pulling from opposite direction: Fwd, bwd, lateral 9R and L) stepping: 10x each  Standing on 1/2 foam roll AP tilt: 5 x 2', worked on stepping strategy for last 2 sets with lesser UE support- mild cognitive impairments noted when patient was asked to adjust his footing as he required multiple cueing.  Sit to stand: 15 lbs KB: 2 x 10 no UE support, cues for increased WB on L LE  Farmer's carry with 15lb SURGE water canister:  - in R hand only: 115' (was unable to carry in L UE due to weakness in L hand) - with bil hands: 115'- noted weaker grip in L UE and lack of elbow and shoulder engagement in L UE  With 15lb SURGE water canister: Standing dead lift, bil elbow curs and OH press: 2 x 5, min  A required with elbow curl and OH press due to L UE weakness, pt unable to fully extend arms.   Discussed evaulation findings with patient and wife. Compared functional test scores from today's session and last reassessment when patient was here in PT before his recent stroke. Wife and patient educated that his gait and balance are to similar level of function before his recent stroke in July. Based on FGA and gait speed, he  doesn't require walker and should be able to ambulate safely without AD. We also considered that patient doesn't really use walker anyways at home and in community he prefers to lift and carry walker instead of using it as intended.   Adjusted walker height to suit patient better as it was set lower for the patient. Also supplied tennis balls over the rear gliders to improve smoother operations of the walker. Pt and wife educated to use the walker if he feels balance is off for safety at home and community.  Wife educated that patient should be able to go up and down steps safetly as long as he has rail to hold onto.  PATIENT EDUCATION: Education details: see above Person educated: Patient Education method: Explanation Education comprehension: verbalized understanding  HOME EXERCISE PROGRAM: TBD  GOALS: Goals reviewed with patient? Yes SHORT TERM GOALS: no STG as POC<4 weeks.   LONG TERM GOALS: Target date: 02/22/2024    Patient will demo 5x sit to stand score of <10 sec to improve overall functional strength. Baseline: 34 s without UE support; 19 seconds (11/18/23); 10.25 seconds (11/30/23); 11.19 sec (01/25/24) Goal status: Initial   2.  Pt will demo functional gait assessment score of >2730 to improve overall balance with functional gait. Baseline: 20/30 (eval); 21/30 (11/18/23); 24/30 (01/25/24) Goal status: Progressing continue  ASSESSMENT:  CLINICAL IMPRESSION: Today's session focused on improving dynamic balance, stepping strategies and functional  carry/lift/bending. Pt reported fatigue at end of the session. Pt's stepping strategy is slower and requires UE or extenral support at times to prevent LOB.   OBJECTIVE IMPAIRMENTS: Abnormal gait, decreased balance, decreased knowledge of condition, and difficulty walking.   ACTIVITY LIMITATIONS: carrying, lifting, squatting, stairs, and transfers  PARTICIPATION LIMITATIONS: meal prep, cleaning, shopping, community activity, and yard work  PERSONAL FACTORS: Time since onset of injury/illness/exacerbation are also affecting patient's functional outcome.   REHAB POTENTIAL: Excellent  CLINICAL DECISION MAKING: Stable/uncomplicated  EVALUATION COMPLEXITY: Low  PLAN:  PT FREQUENCY: 1x/week  PT DURATION: 4 weeks  PLANNED INTERVENTIONS: 97164- PT Re-evaluation, 97110-Therapeutic exercises, 97530- Therapeutic activity, 97112- Neuromuscular re-education, 97535- Self Care, [id number]- Manual therapy, [phone]- Gait training, Patient/Family education, Balance training, Stair training, Joint mobilization, Cognitive remediation, and DME instructions  PLAN FOR NEXT SESSION: Issue HEP, work on backward walking, tandem walking, walking with head turns   Raj LOISE Blanch, PT 02/03/2024, 9:14 AM

## 2024-02-05 ENCOUNTER — Encounter: Payer: Self-pay | Admitting: Internal Medicine

## 2024-02-05 ENCOUNTER — Inpatient Hospital Stay: Attending: Internal Medicine | Admitting: Internal Medicine

## 2024-02-05 VITALS — BP 119/78 | HR 65 | Temp 98.6°F | Resp 20 | Wt 133.4 lb

## 2024-02-05 DIAGNOSIS — R451 Restlessness and agitation: Secondary | ICD-10-CM | POA: Insufficient documentation

## 2024-02-05 DIAGNOSIS — Z8673 Personal history of transient ischemic attack (TIA), and cerebral infarction without residual deficits: Secondary | ICD-10-CM | POA: Diagnosis not present

## 2024-02-05 DIAGNOSIS — Z923 Personal history of irradiation: Secondary | ICD-10-CM | POA: Diagnosis not present

## 2024-02-05 DIAGNOSIS — C711 Malignant neoplasm of frontal lobe: Secondary | ICD-10-CM | POA: Insufficient documentation

## 2024-02-05 DIAGNOSIS — Z7901 Long term (current) use of anticoagulants: Secondary | ICD-10-CM | POA: Insufficient documentation

## 2024-02-05 DIAGNOSIS — R4689 Other symptoms and signs involving appearance and behavior: Secondary | ICD-10-CM | POA: Diagnosis not present

## 2024-02-05 DIAGNOSIS — Z79899 Other long term (current) drug therapy: Secondary | ICD-10-CM | POA: Insufficient documentation

## 2024-02-05 MED ORDER — QUETIAPINE FUMARATE 25 MG PO TABS: 25.0000 mg | ORAL_TABLET | Freq: Two times a day (BID) | ORAL | 2 refills | Status: AC

## 2024-02-05 NOTE — Progress Notes (Signed)
 [facility] [address]  [place], [place] [address] [phone]  Interval Evaluation  Date of Service: 02/05/24 Patient Name: Alan Wise Patient MRN: [id number] Patient DOB: 12-Sep-1970 Provider: Arthea MARLA Manns, MD  Identifying Statement:  Alan Wise is a 53 y.o. 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 Debby; path demonstrates IDH-1 mutant oligodendroglioma   03/20/2021 - 04/30/2021 Radiation Therapy   Completes 54 Gy IMRT and Temodar  with Dr. Izell   06/02/2021 - 03/05/2022 Chemotherapy   Completes 8 cycles of adjuvant 5-day Temozolomide    02/01/2022 Progression   Progression of disease in multifocal/metastatic/lmd pattern   03/11/2022 Progression   Progression of disease confirmed after additional cycle of TMZ, 1 month follow up MRI.  LP/cytology and MRI spine mets screening unremarkable   04/10/2022 Surgery   Stereotactic biopsy of progressive R frontal lesion at [facility] with Dr. Saturnino.  Path demonstrates radiation necrosis   12/03/2022 Surgery   Repeat biopsy with Dr. Saturnino, R frontal.  Path again demonstrates necrosis and treatment effect   03/12/2023 -  Chemotherapy   Patient is on Treatment Plan : BRAIN GBM Bevacizumab  14d x 6 cycles       Biomarkers:   Interval History: Alan Wise presents today for clinical follow up.  He and his wife report improvement in left sided strength, including walking independently.  He continues to work hard with now outpatient/home PT.  Speech and language have not improved to the same degree, if at all.  Continues to speak in word salad.  Lots of behavioral issues persist at home, with agitation and swearing with his young children.  This leads to distress within the home.  He is dosing Eliquis , Lamictal  otherwise unchanged.      H+P (12/31/20) Patient presented to medical  attention this past month, with complaint of involuntary left sided shaking.  He describes episodes, occurring several times per week, of left arm shaking, lasting up to 5 minutes; following the episodes he describes the arm as heavy for some time before returning to normal.  He also describes episodes of sudden inability to speak, with strange movements of face and hands, lasting same amount of time.  These episodes go back ~10 years in very sporadic nature, but have become much more frequent in recent months.  He works full time as a Agricultural consultant at [employer].  These episodes have occurred at work, and have been disruptive in that environment.  He is otherwise fully functional, independent.  Medications: Current Outpatient Medications on File Prior to Visit  Medication Sig Dispense Refill   acetaminophen  (TYLENOL ) 325 MG tablet Take 1-2 tablets (325-650 mg total) by mouth every 4 (four) hours as needed for mild pain (pain score 1-3).     amLODipine  (NORVASC ) 5 MG tablet Take 1 tablet (5 mg total) by mouth daily. 30 tablet 0   apixaban  (ELIQUIS ) 5 MG TABS tablet Take 1 tablet (5 mg total) by mouth 2 (two) times daily. 60 tablet 0   atorvastatin  (LIPITOR) 40 MG tablet Take 1 tablet (40 mg total) by mouth daily with supper. 30 tablet 0   feeding supplement (ENSURE PLUS HIGH PROTEIN) LIQD Take 237 mLs by mouth 3 (three) times daily between meals.     lamoTRIgine  (LAMICTAL ) 150 MG tablet Take 1 tablet (150 mg total) by mouth 2 (two) times daily. 60 tablet 0  Multiple Vitamin (MULTIVITAMIN WITH MINERALS) TABS tablet Take 1 tablet by mouth daily. 30 tablet 0   ondansetron  (ZOFRAN -ODT) 8 MG disintegrating tablet Take 1 tablet (8 mg total) by mouth every 8 (eight) hours. 90 tablet 0   polyethylene glycol powder (GLYCOLAX /MIRALAX ) 17 GM/SCOOP powder Take 1 capful (17 g) by mouth daily. 238 g 0   prochlorperazine  (COMPAZINE ) 5 MG tablet Take 1 tablet (5 mg total) by mouth every 8 (eight) hours as  needed for refractory nausea / vomiting. 30 tablet 0   propranolol  (INDERAL ) 20 MG tablet Take 1 tablet (20 mg total) by mouth 2 (two) times daily. 60 tablet 0   senna-docusate (SENOKOT-S) 8.6-50 MG tablet Take 2 tablets by mouth daily with supper. 60 tablet 0   No current facility-administered medications on file prior to visit.    Allergies:  Allergies  Allergen Reactions   Contrast Media [Iodinated Contrast Media] Swelling   Keppra  [Levetiracetam ]     Agitated and mean   Past Medical History:  Past Medical History:  Diagnosis Date   Allergy    Complication of anesthesia    pt reports he is starting to have more difficulty arousing after surgery   Diabetes mellitus (HCC)    GERD (gastroesophageal reflux disease)    Headache    History of kidney stones    Hyperlipidemia    Renal disorder    kidney stones   Past Surgical History:  Past Surgical History:  Procedure Laterality Date   APPLICATION OF CRANIAL NAVIGATION Right 01/17/2021   Procedure: APPLICATION OF CRANIAL NAVIGATION;  Surgeon: Debby Alan MATSU, MD;  Location: [facility] OR;  Service: Neurosurgery;  Laterality: Right;   COLONOSCOPY  09/03/1994   COLONOSCOPY WITH PROPOFOL  N/A 01/07/2023   Procedure: COLONOSCOPY WITH PROPOFOL ;  Surgeon: Jinny Carmine, MD;  Location: [facility] ENDOSCOPY;  Service: Endoscopy;  Laterality: N/A;  NOT TOO EARLY   CYSTOSCOPY WITH STENT PLACEMENT Right 01/25/2016   Procedure: CYSTOSCOPY WITH STENT PLACEMENT;  Surgeon: Redell Lynwood Napoleon, MD;  Location: [facility] ORS;  Service: Urology;  Laterality: Right;   FRAMELESS  BIOPSY WITH BRAINLAB Right 01/17/2021   Procedure: RIGHT STEREOTACTIC BIOPSY OF INSULAR LESION;  Surgeon: Debby Alan MATSU, MD;  Location: [facility] OR;  Service: Neurosurgery;  Laterality: Right;   KNEE ARTHROSCOPY W/ MENISCAL REPAIR Right 06/23/1988   POLYPECTOMY  01/07/2023   Procedure: POLYPECTOMY;  Surgeon: Jinny Carmine, MD;  Location: [facility] ENDOSCOPY;  Service: Endoscopy;;   removal of  birthmark  06/08/2013   SKIN CANCER EXCISION  06/23/2012   TYMPANOSTOMY TUBE PLACEMENT  08/06/1978   URETEROSCOPY WITH HOLMIUM LASER LITHOTRIPSY Right 01/25/2016   Procedure: URETEROSCOPY WITH HOLMIUM LASER LITHOTRIPSY;  Surgeon: Redell Lynwood Napoleon, MD;  Location: [facility] ORS;  Service: Urology;  Laterality: Right;   URETHRAL STRICTURE DILATATION     visual inspection of vocal cord     1973   WISDOM TOOTH EXTRACTION     Social History:  Social History   Socioeconomic History   Marital status: Married    Spouse name: Not on file   Number of children: Not on file   Years of education: Not on file   Highest education level: Not on file  Occupational History   Not on file  Tobacco Use   Smoking status: Never   Smokeless tobacco: Never  Vaping Use   Vaping status: Never Used  Substance and Sexual Activity   Alcohol use: Not Currently    Comment: rare   Drug use: No   Sexual  activity: Not Currently  Other Topics Concern   Not on file  Social History Narrative   Not on file   Social Drivers of Health   Financial Resource Strain: Low Risk  (12/05/2022)   Received from [facility]   Overall Financial Resource Strain (CARDIA)    Difficulty of Paying Living Expenses: Not hard at all  Food Insecurity: No Food Insecurity (12/18/2023)   Hunger Vital Sign    Worried About Running Out of Food in the Last Year: Never true    Ran Out of Food in the Last Year: Never true  Transportation Needs: No Transportation Needs (12/18/2023)   PRAPARE - Administrator, Civil Service (Medical): No    Lack of Transportation (Non-Medical): No  Physical Activity: Not on file  Stress: Not on file  Social Connections: Moderately Isolated (12/09/2023)   Social Connection and Isolation Panel    Frequency of Communication with Friends and Family: More than three times a week    Frequency of Social Gatherings with Friends and Family: More than three times a week    Attends  Religious Services: Never    Database administrator or Organizations: No    Attends Banker Meetings: Never    Marital Status: Married  Catering manager Violence: Not At Risk (12/18/2023)   Humiliation, Afraid, Rape, and Kick questionnaire    Fear of Current or Ex-Partner: No    Emotionally Abused: No    Physically Abused: No    Sexually Abused: No   Family History:  Family History  Problem Relation Age of Onset   Hyperlipidemia Father    Hypertension Father    Diabetes Father    Benign prostatic hyperplasia Father    Stroke Maternal Grandmother    Diabetes Maternal Grandmother    Stroke Paternal Grandmother    Hypertension Paternal Grandmother    Kidney disease Neg Hx    Prostate cancer Neg Hx     Review of Systems: Constitutional: Doesn't report fevers, chills or abnormal weight loss Eyes: Doesn't report blurriness of vision Ears, nose, mouth, throat, and face: Doesn't report sore throat Respiratory: Doesn't report cough, dyspnea or wheezes Cardiovascular: Doesn't report palpitation, chest discomfort  Gastrointestinal:  Doesn't report nausea, constipation, diarrhea GU: Doesn't report incontinence Skin: Doesn't report skin rashes Neurological: Per HPI Musculoskeletal: Doesn't report joint pain Behavioral/Psych: Doesn't report anxiety  Physical Exam: Vitals:   02/05/24 1141  BP: 119/78  Pulse: 65  Resp: 20  Temp: 98.6 F (37 C)  SpO2: 100%   KPS: 60. General: Fatigued Head: Normal EENT: No conjunctival injection or scleral icterus.  Lungs: Resp effort normal Cardiac: Regular rate Abdomen: Non-distended abdomen Skin: No rashes cyanosis or petechiae. Extremities: No clubbing or edema  Neurologic Exam: Mental Status: Awake, alert, attentive to examiner. Oriented to self and environment.  Language is notable for impaired fluency, with dense impairments in comprehension.  Further mental status limited by aphasia.  Cranial Nerves: Visual acuity is  grossly normal. Right field hemianopia. Extra-ocular movements intact. No ptosis. Mild UMN right facial paresis. Motor: Left arm and leg 4+/5. Power is full in both arms and legs. Reflexes are symmetric, no pathologic reflexes present.  Sensory: Intact to light touch Gait: Hemiparetic   Labs: I have reviewed the data as listed    Component Value Date/Time   NA 138 01/11/2024 0522   NA 138 05/04/2012 1815   K 4.0 01/11/2024 0522   K 3.5 05/04/2012 1815  CL 104 01/11/2024 0522   CL 99 05/04/2012 1815   CO2 24 01/11/2024 0522   CO2 29 05/04/2012 1815   GLUCOSE 167 (H) 01/11/2024 0522   GLUCOSE 69 05/04/2012 1815   BUN 14 01/11/2024 0522   BUN 12 05/04/2012 1815   CREATININE 0.76 01/11/2024 0522   CREATININE 0.95 08/19/2023 0818   CALCIUM  9.3 01/11/2024 0522   CALCIUM  9.2 05/04/2012 1815   PROT 6.5 01/06/2024 0434   PROT 8.5 (H) 05/04/2012 1815   ALBUMIN 3.9 01/06/2024 0434   ALBUMIN 4.5 05/04/2012 1815   AST 21 01/06/2024 0434   AST 15 05/22/2023 0936   ALT 37 01/06/2024 0434   ALT 16 05/22/2023 0936   ALT 77 05/04/2012 1815   ALKPHOS 80 01/06/2024 0434   ALKPHOS 129 05/04/2012 1815   BILITOT 0.7 01/06/2024 0434   BILITOT 0.5 05/22/2023 0936   GFRNONAA >60 01/11/2024 0522   GFRNONAA >60 05/22/2023 0936   GFRNONAA 106 12/05/2020 1014   GFRAA 123 12/05/2020 1014   Lab Results  Component Value Date   WBC 9.9 01/11/2024   NEUTROABS 6.0 12/25/2023   HGB 13.8 01/11/2024   HCT 39.9 01/11/2024   MCV 84.9 01/11/2024   PLT 211 01/11/2024    Assessment/Plan Behavioral change - Plan: Ambulatory referral to Psychiatry  Alan Wise is clinically stable or improved today after extensive post-stroke rehabilitation efforts.  He is stable from focal/stroke standpoint on the Eliquis  5mg  BID.    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  Recommended trial of seroquel  25mg  HS or BID if tolerated.  Previously he did not tolerate  5mg  of Abilify  (cognitive and psychomotor slowing).  Also will place referral to psychiatry for advanced management of these behavioral issues.    He is also seizure free at this time.  Lacmital will con't 150mg  BID.  Con't Propanolol 20mg  BID which may help with headache prevention as well as blood pressure.  Amlodipine  5mg  daily will be continued as well.  We ask that Alan Wise return to clinic in 2 months following next brain MRI, or sooner as needed.  All questions were answered. The patient knows to call the clinic with any problems, questions or concerns. No barriers to learning were detected.  The total time spent in the encounter was 40 minutes and more than 50% was on counseling and review of test results   Arthea MARLA Manns, MD Medical Director of Neuro-Oncology [facility] [place] [facility] 02/05/24 12:12 PM

## 2024-02-06 ENCOUNTER — Other Ambulatory Visit: Payer: Self-pay | Admitting: Internal Medicine

## 2024-02-08 ENCOUNTER — Ambulatory Visit: Admitting: Occupational Therapy

## 2024-02-08 ENCOUNTER — Encounter: Payer: Self-pay | Admitting: Occupational Therapy

## 2024-02-08 ENCOUNTER — Encounter: Payer: Self-pay | Admitting: Speech Pathology

## 2024-02-08 ENCOUNTER — Ambulatory Visit: Payer: Self-pay | Admitting: Speech Pathology

## 2024-02-08 DIAGNOSIS — R41840 Attention and concentration deficit: Secondary | ICD-10-CM

## 2024-02-08 DIAGNOSIS — R41842 Visuospatial deficit: Secondary | ICD-10-CM

## 2024-02-08 DIAGNOSIS — R2681 Unsteadiness on feet: Secondary | ICD-10-CM

## 2024-02-08 DIAGNOSIS — R41841 Cognitive communication deficit: Secondary | ICD-10-CM

## 2024-02-08 DIAGNOSIS — M6281 Muscle weakness (generalized): Secondary | ICD-10-CM

## 2024-02-08 DIAGNOSIS — R278 Other lack of coordination: Secondary | ICD-10-CM

## 2024-02-08 DIAGNOSIS — R4701 Aphasia: Secondary | ICD-10-CM

## 2024-02-08 NOTE — Therapy (Signed)
 OUTPATIENT SPEECH LANGUAGE PATHOLOGY APHASIA TREATMENT   Patient Name: Alan Wise MRN: [id number] DOB:02/10/1971, 53 y.o., male Today's Date: 02/08/2024  PCP: Angeline Montia Haslip, NP REFERRING PROVIDER: Toribio Pitch, PA-C  END OF SESSION:  End of Session - 02/08/24 1210     Visit Number 4    Number of Visits 25    Date for SLP Re-Evaluation 04/14/24    Authorization Type [employer]    SLP Start Time 1100    SLP Stop Time  1150    SLP Time Calculation (min) 50 min    Activity Tolerance Patient tolerated treatment well            Past Medical History:  Diagnosis Date   Allergy    Complication of anesthesia    pt reports he is starting to have more difficulty arousing after surgery   Diabetes mellitus (HCC)    GERD (gastroesophageal reflux disease)    Headache    History of kidney stones    Hyperlipidemia    Renal disorder    kidney stones   Past Surgical History:  Procedure Laterality Date   APPLICATION OF CRANIAL NAVIGATION Right 01/17/2021   Procedure: APPLICATION OF CRANIAL NAVIGATION;  Surgeon: Debby Dorn MATSU, MD;  Location: [facility] OR;  Service: Neurosurgery;  Laterality: Right;   COLONOSCOPY  09/03/1994   COLONOSCOPY WITH PROPOFOL  N/A 01/07/2023   Procedure: COLONOSCOPY WITH PROPOFOL ;  Surgeon: Jinny Carmine, MD;  Location: [facility] ENDOSCOPY;  Service: Endoscopy;  Laterality: N/A;  NOT TOO EARLY   CYSTOSCOPY WITH STENT PLACEMENT Right 01/25/2016   Procedure: CYSTOSCOPY WITH STENT PLACEMENT;  Surgeon: Redell Lynwood Napoleon, MD;  Location: [facility] ORS;  Service: Urology;  Laterality: Right;   FRAMELESS  BIOPSY WITH BRAINLAB Right 01/17/2021   Procedure: RIGHT STEREOTACTIC BIOPSY OF INSULAR LESION;  Surgeon: Debby Dorn MATSU, MD;  Location: [facility] OR;  Service: Neurosurgery;  Laterality: Right;   KNEE ARTHROSCOPY W/ MENISCAL REPAIR Right 06/23/1988   POLYPECTOMY  01/07/2023   Procedure: POLYPECTOMY;  Surgeon: Jinny Carmine, MD;  Location: [facility] ENDOSCOPY;   Service: Endoscopy;;   removal of birthmark  06/08/2013   SKIN CANCER EXCISION  06/23/2012   TYMPANOSTOMY TUBE PLACEMENT  08/06/1978   URETEROSCOPY WITH HOLMIUM LASER LITHOTRIPSY Right 01/25/2016   Procedure: URETEROSCOPY WITH HOLMIUM LASER LITHOTRIPSY;  Surgeon: Redell Lynwood Napoleon, MD;  Location: [facility] ORS;  Service: Urology;  Laterality: Right;   URETHRAL STRICTURE DILATATION     visual inspection of vocal cord     1973   WISDOM TOOTH EXTRACTION     Patient Active Problem List   Diagnosis Date Noted   Nausea and vomiting 01/07/2024   Malnutrition of moderate degree 01/01/2024   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) 12/30/2023   Acute right ACA stroke (HCC) 12/24/2023   Acute CVA (cerebrovascular accident) (HCC) 12/18/2023   Psychomotor agitation 12/11/2023   Sepsis due to gram-negative UTI (HCC) 12/09/2023   Abnormal brain MRI 12/09/2023   Dyslipidemia 12/09/2023   Dysphagia 09/14/2023   Gait disorder 09/14/2023   Seizure disorder (HCC) 09/10/2023   History of CVA (cerebrovascular accident) 09/09/2023   Essential hypertension 09/09/2023   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   Type 2 diabetes mellitus with hyperlipidemia (HCC) 06/12/2014   Hyperlipidemia associated with type 2 diabetes mellitus (HCC) 06/12/2014    ONSET DATE: 01/14/24 referred   REFERRING DIAG: Acute right ACA stroke   THERAPY DIAG: Cognitive communication  deficit  Aphasia  Rationale for Evaluation and Treatment: Rehabilitation  SUBJECTIVE:   SUBJECTIVE STATEMENT: Opening conversation c/b vague speech, perseverations, and circumlocution Pt accompanied by: significant other  PERTINENT HISTORY: history of T2DM- A1c 5.4, GERD, renal calculi, 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 Dr. Buckley felt this was secondary to evolving radiation necrosis v/s novel small vessel infracts and was changed to Eliquis  and abilify   briefly added for ongoing behavorial issues.  He was readmitted on 12/18/23 with recurrent falls with left sided weakness. Repeat MRI brain done revaling new 3.2 X 1.3 cm focus of right basal ganglia infarct  and recent right temporal restricted diffusion no longer present. EEG was negative for seizures and showed cortical dysfunction arising from right fronto-temporal region likely due to underlying structural abnormality. Neurology recommended increasing Lamictal  dose for possible partial seizures.   He continued to have waxing and waning of baseline aphasia w/non verbal state at times and left sided weakness as well as left gaze preference with left hemianopia and left facial droop on 07/01 per exam by Dr. Michaela. 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 MRI brain repeated revealing progressive restricted diffusion mid and distal R-ACA territory infarct, ongoing multifocal right MCA deep white and gray matter unchanged from 06/27 and underlying chronic ischemic disease with encephalomalacia.  PAIN:  Are you having pain? No  FALLS: Has patient fallen in last 6 months?  Yes, Number of falls: 3  LIVING ENVIRONMENT: Lives with: lives with their family Lives in: House/apartment  PLOF:  Level of assistance: Independent with ADLs Employment: On disability  PATIENT GOALS: pt unable to state, spouse desires improved communication, decreased frustration  OBJECTIVE:  Note: Objective measures were completed at Evaluation unless otherwise noted.  Inpatient Rehab ST: Clinical Impression/Discharge Summary:  Pt made slight progress this stay, as evidenced by  mastery of 5/5 LTGs. Severe to profound expressive and moderate receptive aphasia remain at this time. He requires cues to clear L buccal cavity and maintain slow rate, but is tolerating D3 textures/thin liquids w/ minA. Frustration tolerance and receptiveness to education/cueing will continue to limit his overall progress, however, recommend cont ST upon d/c to target remaining deficits, maximize pt communication, and maximize pt independence.   COGNITION: Overall cognitive status: Difficulty to assess due to: Communication impairment  AUDITORY COMPREHENSION: Overall auditory comprehension: Impaired: simple, moderately complex, and complex YES/NO questions: Impaired: moderately complex and complex Following directions: Appears intact, single step. Did not attempt multi-step Conversation: Impaired, Simple Interfering components: attention, processing speed, and working memory Effective technique: extra processing time, repetition/stressing words, slowed speech, and stressing words  READING COMPREHENSION: Impaired: L neglect  EXPRESSION: verbal  VERBAL EXPRESSION: Level of generative/spontaneous verbalization: conversation Automatic speech: name: impaired, social response: impaired, counting: intact, and day of week: impaired  Repetition: Impaired: Word Naming: Confrontation: 0% accuracy Pragmatics: Impaired: abnormal effect, topic appropriateness, topic maintenance, and turn taking Interfering components: auditory comprehension, attention  Effective technique: sentence completion, phonemic cues, and articulatory cues Non-verbal means of communication: none  WRITTEN EXPRESSION: Dominant hand: right Written expression: Impaired: word  MOTOR SPEECH: Overall motor speech: Appears intact  ORAL MOTOR EXAMINATION: Overall status: Impaired:   Lingual: Left (ROM and  Symmetry)  STANDARDIZED ASSESSMENTS: QAB severe impairment Word comprehension 10.00  Sentence comprehension 0.83  Word  finding 0.00  Grammatical construction 2.00  Speech motor programming 10.00  Repetition 0.00  Reading 7.92  QAB overall 4.02   PATIENT REPORTED OUTCOME MEASURES (PROM): Deferred d/t severity of impairment                                                                                                                          TREATMENT DATE:   02/08/24: Introduced Touch Talk AAC device - demonstrated how to add icon and folder, use the photo option. 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 She is aware that she will need to initiate use of Touch Talk. Hero had multiple episodes of becoming belligerent and agitated shaking his fists and yelling out. [name] reports this happens throughout the day and he has lashed out physically a few times, hitting her arm. He is up at night, waking her without awareness. Overall awareness, attention and decision making severely impaired. Encouraged him to try Seroquel  as prescribed by his MD. Tinnie reports he refuses to take new meds, today he agreed to try this med tonight. Psych consult pending.   02/02/24: Utilize interest and then tolerate to generate list of salient topics for future SGD customization.  Patient identifies the following: Eating out, family, food and meals, movies and TV, news and current events, personal needs, pets, sports, conflict.  For food and meal patient would benefit from folder dedicated to getting instruction to wife for preparation of preferred meals, including ingredients.  For support patient would benefit from folder for conversation with kids regarding their sports involvement including soccer T-ball and basketball.  Utilizing [facility] board and writing as multimodal communication patient correctly answered 4 of 5 personal questions.  ABC board provided for use at home as patient continues to benefit from first letter cues for anomia repair.   01/26/24: Given significant aphasia, trialed multimodal communication today,  including writing and [employer] TouchTalk device. Pt able to write wife's name with min error to augment naming. Otherwise, writing inconsistent with perseverations and agraphia. Demonstrated how to functionally utilize TouchTalk device for naming preferred foods items. Good oral reading accuracy exhibited. Able to ID preferred choice f/o 3 with good accuracy. Generated AAC categories related to his children. Required usual max A to name interests of children. Cued gestures were beneficial x2. Pt and wife interested in pursuing SGD trials, with SLP submitting information this session.  01/21/24: Explained rationale for speech generating device recommendation. Discussed benefits of increased independence with communication and to support rehabilitation. Discussed severity of impairment and prior level of functioning supporting likelihood of slow rehab course, and benefit of SGD for improved communication while rehabilitating. Pt does not desire AAC at this time   PATIENT EDUCATION: Education details: POC/goals Person educated: Patient and Spouse Education method: Explanation Education comprehension: verbalized understanding and needs further education   GOALS: Goals reviewed with patient? Yes  SHORT TERM GOALS: Target date: 03/17/2024  Patient will use multimodal communication to communicate basic wants and needs with rare min-A Baseline: Goal status: ONGOING  2.  Patient and caregivers will generate x10 functional home-based targets for low or high tech AAC system  Baseline:  Goal status: ONGOING  3.  Patient will complete word level structured language tasks with mod-A  Baseline:  Goal status: ONGOING  4.  Pt will demonstrate accurate gesture for given target with 80% accuracy  Baseline:  Goal status: ONGOING   LONG TERM GOALS: Target date: 04/14/2024  Pt will use multimodal communication to communicate preferences in simple conversation  Baseline:  Goal status: ONGOING  2.   Pt and caregivers will carryover 3 compensatory strategies to support pt's auditory comprehension  Baseline:  Goal status: ONGOING  3.  Pt and caregiver will demonstrate supportive communication techniques in conversation to support pt expressive language Baseline:  Goal status: ONGOING  4.  Pt and spouse will carryover 1-2 language enhancing activities at home daily 5/7 days  Baseline:  Goal status: ONGOING  5.  Pt will generate moderately complex sentences in structured task such as VNeST with usual min A  Baseline:  Goal status: ONGOING   ASSESSMENT:  CLINICAL IMPRESSION: Patient is a 53 y.o. M who was seen today for aphasia tx. Known to clinic from prior ST course, since has had another stroke. Communication impairment during prior ST course was severe, profound impairment at this time. Pt presenting with fluent aphsia c/b nonsensical speech that lacks meaning, tangential speech, usual use of a few rote phrases repetitively (the thing, the thing, that which is). During picture description task, pt says that, that, that with no content.  Benefit observed from phonemic and phrase completion cues to aid in anomia recovery.  Patient unable to repeat, challenges with answering yes no questions.  Per spouse primary means of communication was asking yes/no questions prior to most recent stroke, this new impairment has significantly impacted her communication and skills in the home environment.  Patient appears to lack awareness, he typically responds to questions with a long-winded nonsensical speech requiring SLP cues to stop.  Due to severity of impairment and prior level of function I highly recommend implementation of a speech generating device.  At this time patient declines will continue to address in future therapy sessions.  Spouse appears receptive to idea of communication support.   OBJECTIVE IMPAIRMENTS: include attention, awareness, executive functioning, and aphasia. These  impairments are limiting patient from effectively communicating at home and in community. Factors affecting potential to achieve goals and functional outcome are previous level of function and severity of impairments. Patient will benefit from skilled SLP services to address above impairments and improve overall function.  REHAB POTENTIAL: Fair (severity of impairments)  PLAN:  SLP FREQUENCY: 2x/week  SLP DURATION: 12 weeks  PLANNED INTERVENTIONS: Language facilitation, Cueing hierachy, Internal/external aids, Functional tasks, Multimodal communication approach, SLP instruction and feedback, Compensatory strategies, and Patient/family education    Mathis Leita Caldron, CCC-SLP 02/08/2024, 12:10 PM

## 2024-02-08 NOTE — Telephone Encounter (Signed)
 Per OV note from 8/15, continue amlodipine . Andrea CHRISTELLA Plunk, RN

## 2024-02-08 NOTE — Therapy (Signed)
 OUTPATIENT OCCUPATIONAL THERAPY NEURO TREATMENT  Patient Name: Alan Wise MRN: [id number] DOB:1971/06/08, 53 y.o., male Today's Date: 02/08/2024  PCP: Antonette Angeline ORN, NP  REFERRING PROVIDER: Pegge Toribio PARAS, PA-C  END OF SESSION:  OT End of Session - 02/08/24 0855     Visit Number 5    Number of Visits 16    Date for OT Re-Evaluation 03/22/24    Authorization Type [employer] 2025 OOPM MET    Authorization Time Period Covered 100% VL:MN Auth Not Reqd    OT Start Time [phone]    OT Stop Time 0930    OT Time Calculation (min) 40 min    Activity Tolerance Patient tolerated treatment well    Behavior During Therapy Agitated;Impulsive          Past Medical History:  Diagnosis Date   Allergy    Complication of anesthesia    pt reports he is starting to have more difficulty arousing after surgery   Diabetes mellitus (HCC)    GERD (gastroesophageal reflux disease)    Headache    History of kidney stones    Hyperlipidemia    Renal disorder    kidney stones   Past Surgical History:  Procedure Laterality Date   APPLICATION OF CRANIAL NAVIGATION Right 01/17/2021   Procedure: APPLICATION OF CRANIAL NAVIGATION;  Surgeon: Debby Dorn MATSU, MD;  Location: [facility] OR;  Service: Neurosurgery;  Laterality: Right;   COLONOSCOPY  09/03/1994   COLONOSCOPY WITH PROPOFOL  N/A 01/07/2023   Procedure: COLONOSCOPY WITH PROPOFOL ;  Surgeon: Jinny Carmine, MD;  Location: [facility] ENDOSCOPY;  Service: Endoscopy;  Laterality: N/A;  NOT TOO EARLY   CYSTOSCOPY WITH STENT PLACEMENT Right 01/25/2016   Procedure: CYSTOSCOPY WITH STENT PLACEMENT;  Surgeon: Redell Lynwood Napoleon, MD;  Location: [facility] ORS;  Service: Urology;  Laterality: Right;   FRAMELESS  BIOPSY WITH BRAINLAB Right 01/17/2021   Procedure: RIGHT STEREOTACTIC BIOPSY OF INSULAR LESION;  Surgeon: Debby Dorn MATSU, MD;  Location: [facility] OR;  Service: Neurosurgery;  Laterality: Right;   KNEE ARTHROSCOPY W/ MENISCAL REPAIR Right 06/23/1988    POLYPECTOMY  01/07/2023   Procedure: POLYPECTOMY;  Surgeon: Jinny Carmine, MD;  Location: [facility] ENDOSCOPY;  Service: Endoscopy;;   removal of birthmark  06/08/2013   SKIN CANCER EXCISION  06/23/2012   TYMPANOSTOMY TUBE PLACEMENT  08/06/1978   URETEROSCOPY WITH HOLMIUM LASER LITHOTRIPSY Right 01/25/2016   Procedure: URETEROSCOPY WITH HOLMIUM LASER LITHOTRIPSY;  Surgeon: Redell Lynwood Napoleon, MD;  Location: [facility] ORS;  Service: Urology;  Laterality: Right;   URETHRAL STRICTURE DILATATION     visual inspection of vocal cord     1973   WISDOM TOOTH EXTRACTION     Patient Active Problem List   Diagnosis Date Noted   Nausea and vomiting 01/07/2024   Malnutrition of moderate degree 01/01/2024   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) 12/30/2023   Acute right ACA stroke (HCC) 12/24/2023   Acute CVA (cerebrovascular accident) (HCC) 12/18/2023   Psychomotor agitation 12/11/2023   Sepsis due to gram-negative UTI (HCC) 12/09/2023   Abnormal brain MRI 12/09/2023   Dyslipidemia 12/09/2023   Dysphagia 09/14/2023   Gait disorder 09/14/2023   Seizure disorder (HCC) 09/10/2023   History of CVA (cerebrovascular accident) 09/09/2023   Essential hypertension 09/09/2023   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 Pt seems better today  (possible TIA?)  Pt aphasic - verbal but unable to communicate what he wanted. Pt did have occasional automatic appropriate responses.  Wife reports he had first stroke in March, possibly another smaller stroke, then this new stroke end of June with worsened symptoms. 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  Presented to the hospital 09/14/2023 with worsening gait left-sided weakness/aphasia and new onset dysphagia.  Hospitalized from 3-19 to 3-21 with CVA and breakthrough seizures.    PRECAUTIONS: Fall and Other: impulsive, decreased awareness, Lt visual field loss, seizures (recent one in beginning of July - 7th or 8th)   WEIGHT BEARING RESTRICTIONS: No  PAIN:  Are you having pain? No  FALLS: Has patient fallen in last 6 months? Yes. Number of falls 3  LIVING ENVIRONMENT: Lives with: lives with their family (wife, 28 y.o. dtr, 73 y.o. 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  HAND DOMINANCE: Left, but now using Rt hand  ADLs: Overall ADLs: Pt requires 24 hr supervison Transfers/ambulation related to ADLs: pt not using  walker safety/correctly often lifting it Eating: mod I eating with Rt hand since March (first stroke), wife cuts food  Grooming: mod I with Rt hand, wife trimming beard and/or shaving UB Dressing: independent LB Dressing: assist for Lt shoe and sock, and occasionally RT sock Toileting: independent Bathing: distant sup (wife reports she's not allowed in there - pt will not let her assist)  Tub Shower transfers: mod I  Equipment: none  IADLs: dep for all IADLS Community mobility: Has not driven since Nov 2024 d/t seizure, then more seizures, then first stroke Medication management: wife giving pt all medications Financial management: dep Handwriting: unable d/t weakness and aphasia but able to sign name only with assist from Lt hand  MOBILITY STATUS: using walker but often lifts it and does not use correctly   FUNCTIONAL OUTCOME MEASURES: TBA  UPPER EXTREMITY ROM:  RUE AROM WNLs LUE more limited at shoulder but distally WFLs   Active ROM Right eval Left eval  Shoulder flexion  90*  Shoulder abduction    Shoulder adduction    Shoulder extension    Shoulder internal rotation  WFL's  Shoulder external rotation  Approx 50%  Elbow flexion    Elbow extension  Wrist flexion    Wrist extension    Wrist ulnar deviation    Wrist radial deviation    Wrist pronation    Wrist supination    (Blank rows = not tested)    HAND FUNCTION: Grip strength: Right: 60.4 lbs; Left: 41.8 lbs 02/08/24: Lt = 35 lbs  COORDINATION: Pt can pick up pen Lt hand but cannot manipulate (? Apraxia as well as decreased coordination) - pt has had decline in coordination since recent new stroke Box & Blocks: Lt = 9  SENSATION: Appears WFL's - unable to localize stimulation but ? If this is due to aphasia  EDEMA: none  MUSCLE TONE: LUE: Mild and Hypertonic  COGNITION: Overall cognitive status: Impaired and significantly impaired expressive aphasia, and moderate receptive aphasia. Pt easily  frustrated trying to express himself. Pt also impulsive  VISION: Subjective report: per wife: vision has decreased more, but impaired prior to first stroke Baseline vision: Wears glasses for reading only Visual history: changes in vision prior to CVA with necrosis from radiation  VISION ASSESSMENT: Impaired Visual Fields: Left homonymous hemianopsia  Decreased general awareness and attention also - especially to Lt side  PERCEPTION: Impaired: Inattention/neglect: does not attend to left visual field and does not attend to left side of body  PRAXIS: Impaired: Ideomotor  OBSERVATIONS: Lt side improving from first stroke, then recent stroke made it worse. Pt becomes very agitated/frustrated when he is unable to communicate what he wants to say and gets mad at spouse when she tries to speak (for clarification).                                                                                                                              TREATMENT DATE: 02/08/24  Discussed yesterday's symptoms - wife reports Lt arm and leg were much weaker but seems to have resolved today. BP = 135/84, HR = 76 Pt has lost grip strength Lt hand since evaluation (see above)   Pt assembling 24 pc puzzle still favoring Rt side, however looking more to Lt side without cueing today. Pt using Lt hand with difficulty and required assist Rt hand to connect pieces. Coordination diminished Lt hand  Environmental scanning down quiet hallway - pt found 8/16 items (50% accuracy) on first pass, missing 8 items (6 of which were on Lt side). Pt found 2/8 remaining items on 2nd pass - one on Rt and one on Lt with 5 remaining items on Lt side, 1 on Rt side.    PATIENT EDUCATION: Education details: visual scanning strategies and activities, coordination HEP  Person educated: Patient and Spouse Education method: Explanation, Demonstration, Tactile cues, Verbal cues, and Handouts Education comprehension: wife verbalized  understanding, returned demonstration, verbal cues required, and needs further education  HOME EXERCISE PROGRAM: 01/27/24: visual scanning strategies and activities, coordination HEP    GOALS: Goals reviewed with patient? Yes   SHORT TERM GOALS: Target date: 02/20/24  Pt to be independent  donning/doffing Lt shoe and both socks Baseline: Goal status: INITIAL  2.  Pt will be able to demo management of anger when he gets frustrated Baseline:  Goal status: DEFERRED - pt is doing much better and no longer needed.  3.  Pt/family to verbalize understanding of visual scanning strategies and pt able to recall at least 2 compensatory strategies for visual impairment without cueing Baseline:  Goal status: IN PROGRESS  4.  Pt independent with HEP for LT shoulder ROM and Lt hand coordination Baseline:  Goal status: IN PROGRESS  5.  Pt will improve LUE function as evidenced by performing 15 blocks on Box & Blocks test Baseline: 9 blocks Goal status: INITIAL   LONG TERM GOALS: Target date: 03/22/24  Pt will be independent with updated HEP  Baseline:  Goal status: INITIAL  2.  Patient will return demonstration of visual adaptation use and verbalize understanding of how to obtain item(s) if desired.  Baseline:  Goal status: INITIAL  3.  Patient will demo improved FM coordination as evidenced by completing nine-hole peg with LUE use and assist from RUE prn in 60 sec or under Baseline: unable Lt hand Goal status: INITIAL  4.  Pt with improve LUE function as evidenced by performing 20 or more blocks on Box & Blocks Baseline: 9 blocks Goal status: INITIAL  5.  Pt to write name Lt hand with 90% legibility Baseline:  Goal status: INITIAL  6.  Pt will perform environmental scanning safely with min cues for 75% accuracy Baseline:  Goal status: INITIAL  ASSESSMENT:  CLINICAL IMPRESSION: Patient with decreased strength/coordination Lt side today. Wife reports he was much weaker on Lt  side yesterday - question TIA? Pt's vitals WNL's today and has improved since yesterday per wife report.    PERFORMANCE DEFICITS: in functional skills including ADLs, IADLs, coordination, proprioception, tone, ROM, strength, Fine motor control, Gross motor control, mobility, balance, body mechanics, endurance, decreased knowledge of precautions, decreased knowledge of use of DME, vision, and UE functional use, cognitive skills including attention, perception, and safety awareness, and psychosocial skills including environmental adaptation.   IMPAIRMENTS: are limiting patient from ADLs, IADLs, rest and sleep, leisure, and social participation.   CO-MORBIDITIES: has co-morbidities such as oligodendroglioma, seizures that affects occupational performance. Patient will benefit from skilled OT to address above impairments and improve overall function.  MODIFICATION OR ASSISTANCE TO COMPLETE EVALUATION: Min-Moderate modification of tasks or assist with assess necessary to complete an evaluation.  OT OCCUPATIONAL PROFILE AND HISTORY: Detailed assessment: Review of records and additional review of physical, cognitive, psychosocial history related to current functional performance.  CLINICAL DECISION MAKING: Moderate - several treatment options, min-mod task modification necessary  REHAB POTENTIAL: Fair decreased insight/awareness into deficits, decreased attention to Lt side, aphasia  EVALUATION COMPLEXITY: Moderate    PLAN:  OT FREQUENCY: 2x/week  OT DURATION: 8 weeks  PLANNED INTERVENTIONS: 97535 self care/ADL training, 02889 therapeutic exercise, 97530 therapeutic activity, 97112 neuromuscular re-education, 97140 manual therapy, 97010 moist heat, 97129 Cognitive training (first 15 min), 02869 Cognitive training(each additional 15 min), 02239 Orthotic Initial, 97763 Orthotic/Prosthetic subsequent, passive range of motion, balance training, functional mobility training, visual/perceptual  remediation/compensation, energy conservation, coping strategies training, patient/family education, and DME and/or AE instructions  RECOMMENDED OTHER SERVICES: Pt scheduled for upcoming P.T. and SLP evaluations.   CONSULTED AND AGREED WITH PLAN OF CARE: Patient and family member/caregiver  PLAN FOR NEXT SESSION: visual scanning and coordination Lt side   Burnard JINNY Roads, OT 02/08/2024, 8:56 AM

## 2024-02-10 ENCOUNTER — Encounter: Payer: Self-pay | Admitting: Speech Pathology

## 2024-02-10 ENCOUNTER — Other Ambulatory Visit: Payer: Self-pay | Admitting: Licensed Clinical Social Worker

## 2024-02-10 ENCOUNTER — Ambulatory Visit

## 2024-02-10 ENCOUNTER — Ambulatory Visit: Payer: Self-pay | Admitting: Speech Pathology

## 2024-02-10 ENCOUNTER — Ambulatory Visit: Admitting: Occupational Therapy

## 2024-02-10 ENCOUNTER — Telehealth: Payer: Self-pay | Admitting: *Deleted

## 2024-02-10 DIAGNOSIS — R4182 Altered mental status, unspecified: Secondary | ICD-10-CM

## 2024-02-10 DIAGNOSIS — R2681 Unsteadiness on feet: Secondary | ICD-10-CM

## 2024-02-10 DIAGNOSIS — I6300 Cerebral infarction due to thrombosis of unspecified precerebral artery: Secondary | ICD-10-CM

## 2024-02-10 DIAGNOSIS — R4701 Aphasia: Secondary | ICD-10-CM

## 2024-02-10 DIAGNOSIS — R41841 Cognitive communication deficit: Secondary | ICD-10-CM

## 2024-02-10 DIAGNOSIS — M6281 Muscle weakness (generalized): Secondary | ICD-10-CM
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
## 2024-02-10 NOTE — Therapy (Signed)
 OUTPATIENT PHYSICAL THERAPY NEURO TREATMENT NOTE   Patient Name: Alan Wise MRN: [id number] DOB:[birth year], 53 y.o., male Today's Date: 02/10/2024   PCP: Angeline Laura, NP REFERRING PROVIDER: Toribio Pitch, PA-C  END OF SESSION:  PT End of Session - 02/10/24 0804     Visit Number 3    Number of Visits 5    Date for PT Re-Evaluation 02/22/24    Authorization Type [employer]    PT Start Time 0805    PT Stop Time 0845    PT Time Calculation (min) 40 min    Equipment Utilized During Treatment Gait belt    Activity Tolerance Patient tolerated treatment well    Behavior During Therapy WFL for tasks assessed/performed          Past Medical History:  Diagnosis Date   Allergy    Complication of anesthesia    pt reports he is starting to have more difficulty arousing after surgery   Diabetes mellitus (HCC)    GERD (gastroesophageal reflux disease)    Headache    History of kidney stones    Hyperlipidemia    Renal disorder    kidney stones   Past Surgical History:  Procedure Laterality Date   APPLICATION OF CRANIAL NAVIGATION Right 01/17/2021   Procedure: APPLICATION OF CRANIAL NAVIGATION;  Surgeon: Debby Dorn MATSU, MD;  Location: [facility] OR;  Service: Neurosurgery;  Laterality: Right;   COLONOSCOPY  09/03/1994   COLONOSCOPY WITH PROPOFOL  N/A 01/07/2023   Procedure: COLONOSCOPY WITH PROPOFOL ;  Surgeon: Jinny Carmine, MD;  Location: [facility] ENDOSCOPY;  Service: Endoscopy;  Laterality: N/A;  NOT TOO EARLY   CYSTOSCOPY WITH STENT PLACEMENT Right 01/25/2016   Procedure: CYSTOSCOPY WITH STENT PLACEMENT;  Surgeon: Redell Lynwood Napoleon, MD;  Location: [facility] ORS;  Service: Urology;  Laterality: Right;   FRAMELESS  BIOPSY WITH BRAINLAB Right 01/17/2021   Procedure: RIGHT STEREOTACTIC BIOPSY OF INSULAR LESION;  Surgeon: Debby Dorn MATSU, MD;  Location: [facility] OR;  Service: Neurosurgery;  Laterality: Right;   KNEE ARTHROSCOPY W/ MENISCAL REPAIR Right 06/23/1988   POLYPECTOMY   01/07/2023   Procedure: POLYPECTOMY;  Surgeon: Jinny Carmine, MD;  Location: [facility] ENDOSCOPY;  Service: Endoscopy;;   removal of birthmark  06/08/2013   SKIN CANCER EXCISION  06/23/2012   TYMPANOSTOMY TUBE PLACEMENT  08/06/1978   URETEROSCOPY WITH HOLMIUM LASER LITHOTRIPSY Right 01/25/2016   Procedure: URETEROSCOPY WITH HOLMIUM LASER LITHOTRIPSY;  Surgeon: Redell Lynwood Napoleon, MD;  Location: [facility] ORS;  Service: Urology;  Laterality: Right;   URETHRAL STRICTURE DILATATION     visual inspection of vocal cord     1973   WISDOM TOOTH EXTRACTION     Patient Active Problem List   Diagnosis Date Noted   Nausea and vomiting 01/07/2024   Malnutrition of moderate degree 01/01/2024   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) 12/30/2023   Acute right ACA stroke (HCC) 12/24/2023   Acute CVA (cerebrovascular accident) (HCC) 12/18/2023   Psychomotor agitation 12/11/2023   Sepsis due to gram-negative UTI (HCC) 12/09/2023   Abnormal brain MRI 12/09/2023   Dyslipidemia 12/09/2023   Dysphagia 09/14/2023   Gait disorder 09/14/2023   Seizure disorder (HCC) 09/10/2023   History of CVA (cerebrovascular accident) 09/09/2023   Essential hypertension 09/09/2023   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   Type 2 diabetes mellitus with hyperlipidemia (HCC) 06/12/2014   Hyperlipidemia associated with type 2 diabetes mellitus (HCC) 06/12/2014    ONSET  DATE: 10/18/23  REFERRING DIAG: acute right ACA stroke  THERAPY DIAG:  Muscle weakness (generalized)  Unsteadiness on feet  Cerebrovascular accident (CVA) due to thrombosis of precerebral artery (HCC)  Rationale for Evaluation and Treatment: Rehabilitation  SUBJECTIVE:                                                                                                                                                                                              SUBJECTIVE STATEMENT: Wife reports that he fell last night. Wife came up stairs and found him on the floor and he had difficulty getting off the floor. Wife has concerns when he is on the floor, he has hard time getting back up. He also had difficulty getting from supine to sitting position as he was struggling more this morning. Wife reports that he started on new mood stabilizers about 1 day ago and thinks the medication may have something to do with fall and his difficulties with transfer. Pt accompanied by: self and wife  PERTINENT HISTORY: PMHx: T2DM, GERD, CVA w/ L sided weakness, oligodendroglioma  PAIN:  Are you having pain? No  PRECAUTIONS: Fall  RED FLAGS: None   WEIGHT BEARING RESTRICTIONS: No  FALLS: Has patient fallen in last 6 months? Yes. Number of falls 1  LIVING ENVIRONMENT: Lives with: lives with their spouse Lives in: House/apartment Stairs: Yes: Internal: 13 steps Has following equipment at home: Walker - 2 wheeled and None  PLOF: Needs assistance with homemaking  PATIENT GOALS: improve balance  OBJECTIVE:  Note: Objective measures were completed at Evaluation unless otherwise noted.  DIAGNOSTIC FINDINGS: 01/15/24 IMPRESSION: 1. Interval evolution of the nonhemorrhagic infarcts involving the right single lid gyrus, right basal ganglia and periventricular white matter and left lentiform nucleus. 2. Stable appearance of right frontal lobe previously treated neoplasm, with hemosiderin staining from prior hemorrhage and surrounding gliosis. No adverse interval change.  COGNITION: Overall cognitive status: Difficulty to assess due to: Communication impairment    LOWER EXTREMITY ROM:     Active  Right Eval Left Eval  Hip flexion    Hip extension    Hip abduction    Hip adduction    Hip internal rotation    Hip external rotation    Knee flexion    Knee extension    Ankle dorsiflexion    Ankle plantarflexion    Ankle inversion    Ankle eversion      (Blank rows = not tested)  LOWER EXTREMITY MMT:    MMT Right Eval Left Eval  Hip flexion    Hip extension    Hip abduction  Hip adduction    Hip internal rotation    Hip external rotation    Knee flexion    Knee extension    Ankle dorsiflexion 5/5 5/5  Ankle plantarflexion    Ankle inversion    Ankle eversion    (Blank rows = not tested) GAIT: Findings: Gait Characteristics: poor foot clearance- Right and poor foot clearance- Left, Distance walked: 230', and Comments: Pt has occasional steppage gait which can occur left or right foot and is not due to foot drop. According to patient he has always done that since he was young (before his 2 strokes and chemo/radiation treatments). Pt denies falls from this steppage gait.  FUNCTIONAL TESTS:  5 times sit to stand: 11.20 sec (01/25/24) 10 meter walk test: 1.1 m/s without AD (01/25/24) Functional gait assessment:  FUNCTIONAL GAIT ASSESSMENT  Date: 01/25/24 Score  GAIT LEVEL SURFACE Instructions: Walk at your normal speed from here to the next mark (6 m) [20 ft]. (3) Normal - Walks 6 m (20 ft) in less than 5.5 seconds, no assistive devices, good speed, no evidence for imbalance, normal gait pattern, deviates no more than 15.24 cm (6 in) outside of the 30.48-cm (12-in) walkway width.  2.   CHANGE IN GAIT SPEED Instructions: Begin walking at your normal pace (for 1.5 m [5 ft]). When I tell you "go," walk as fast as you can (for 1.5 m [5 ft]). When I tell you "slow," walk as slowly as you can (for 1.5 m [5 ft]. (3) Normal - Able to smoothly change walking speed without loss of balance or gait deviation. Shows a significant difference in walking speeds between normal, fast, and slow speeds. Deviates no more than 15.24 cm (6 in) outside of the 30.48-cm (12-in) walkway width.  3.    GAIT WITH HORIZONTAL HEAD TURNS Instructions: Walk from here to the next mark 6 m (20 ft) away. Begin walking at your normal pace. Keep walking straight; after 3  steps, turn your head to the right and keep walking straight while looking to the right. After 3 more steps, turn your head to the left and keep walking straight while looking left. Continue alternating looking right and left. (2) Mild impairment - Performs head turns smoothly with slight change in gait velocity (eg, minor disruption to smooth gait path), deviates 15.24 -25.4 cm (6 -10 in) outside 30.48-cm (12-in) walkway width, or uses an assistive device.  4.   GAIT WITH VERTICAL HEAD TURNS Instructions: Walk from here to the next mark (6 m [20 ft]). Begin walking at your normal pace. Keep walking straight; after 3 steps, tip your head up and keep walking straight while looking up. After 3 more steps, tip your head down, keep walking straight while looking down. Continue  alternating looking up and down every 3 steps until you have completed 2 repetitions in each direction. (3) Normal - Performs head turns with no change in gait. Deviates no more than 15.24 cm (6 in) outside 30.48-cm (12-in) walkway width.  5.  GAIT AND PIVOT TURN Instructions: Begin with walking at your normal pace. When I tell you, "turn and stop," turn as quickly as you can to face the opposite direction and stop. (3) Normal - Pivot turns safely within 3 seconds and stops quickly with no loss of balance  6.   STEP OVER OBSTACLE Instructions: Begin walking at your normal speed. When you come to the shoe box, step over it, not around it, and keep walking. (3) Normal -  Is able to step over 2 stacked shoe boxes taped together (22.86 cm [9 in] total height) without changing gait speed; no evidence of imbalance.  7.   GAIT WITH NARROW BASE OF SUPPORT Instructions: Walk on the floor with arms folded across the chest, feet aligned heel to toe in tandem for a distance of 3.6 m [12 ft]. The number of steps taken in a straight line are counted for a maximum of 10 steps. (1) Moderate impairment - Ambulates 4 -7 steps.  8.   GAIT WITH EYES  CLOSED Instructions: Walk at your normal speed from here to the next mark (6 m [20 ft]) with your eyes closed. (3) Normal - Walks 6 m (20 ft), no assistive devices, good speed, no evidence of imbalance, normal gait pattern, deviates no more than 15.24 cm (6 in) outside 30.48-cm (12-in) walkway width. Ambulates 6 m (20 ft) in less than 7 seconds.  9.   AMBULATING BACKWARDS Instructions: Walk backwards until I tell you to stop (1) Moderate impairment - Walks 6 m (20 ft), slow speed, abnormal gait pattern, evidence for imbalance, deviates 25.4 -38.1 cm (10 -15 in) outside 30.48-cm (12-in) walkway width.  10. STEPS Instructions: Walk up these stairs as you would at home (ie, using the rail if necessary). At the top turn around and walk down. (1) Moderate impairment-Two feet to a stair; must use rail.  Total 24/30   Interpretation of scores: Non-Specific Older Adults Cutoff Score: <=22/30 = risk of falls Parkinson's Disease Cutoff score <15/30= fall risk (Hoehn & Yahr 1-4)  Minimally Clinically Important Difference (MCID)  Stroke (acute, subacute, and chronic) = MDC: 4.2 points Vestibular (acute) = MDC: 6 points Community Dwelling Older Adults =  MCID: 4 points Parkinson's Disease  =  MDC: 4.3 points  (Academy of Neu[employer]ic Physical Therapy (nd). Functional Gait Assessment. Retrieved from https://www.neuropt.org/docs/default-source/cpgs/core-outcome-measures/function-gait-assessment-pocket-guide-proof9-(2).pdf?sfvrsn=b27f35043_0.)D                                                                                                                              TREATMENT DATE:   02/10/24  We performed floor transfers for total of 7 times in the session with gradually reducing verbal and tactile cueing by 90% towards the last round when getting up. Patient expresses pain in his L arm and L shoulder and noted difficulty with transitioning to left side when changing positions from prone/supine to sidelying ot  pushing off on his left side. So below sequencing was decided for patient and patient/wife educated on this sequence.  Stand to supine/prone:  - both hands on mat table, drop one knee to half knee, to tall kneeling, to Q-ped, to prone, to sidelying to supine Supine to stand: - Supine to R sidelying, R sidelying to tall sitting, Tall sitting to Q-ped (turning to his R side), Q-ped to tall kneeling, tall kneeling to half kneeling with both hands on mat table, half kneeling to stand with both UE on table in  front, wait 5 sec before coming to full stand and then sit on mat table.  Pt and wife educated that it is always better to have chair or stool in front where he can use that as support surface to get back up. Instead of tall bed or tall countertops. Wife to bring a chair closer to him to get him off the floor.   Pt was walked to SLP treatment room after end of the session.     PATIENT EDUCATION: Education details: see above Person educated: Patient Education method: Explanation Education comprehension: verbalized understanding  HOME EXERCISE PROGRAM: TBD  GOALS: Goals reviewed with patient? Yes SHORT TERM GOALS: no STG as POC<4 weeks.   LONG TERM GOALS: Target date: 02/22/2024    Patient will demo 5x sit to stand score of <10 sec to improve overall functional strength. Baseline: 34 s without UE support; 19 seconds (11/18/23); 10.25 seconds (11/30/23); 11.19 sec (01/25/24) Goal status: Initial   2.  Pt will demo functional gait assessment score of >2730 to improve overall balance with functional gait. Baseline: 20/30 (eval); 21/30 (11/18/23); 24/30 (01/25/24) Goal status: Progressing continue  ASSESSMENT:  CLINICAL IMPRESSION: Patient demo some cognitive changes today as he was having mild difficulty with following directions and with decision making with floor transfers. Pt also was agitated when his wife was expressing her observations about his falls. Pt and wife were both educated  on continuing to monitor his symptoms while on new modication and contact MD if symptoms are significantly affecting his physical and mental status.  OBJECTIVE IMPAIRMENTS: Abnormal gait, decreased balance, decreased knowledge of condition, and difficulty walking.   ACTIVITY LIMITATIONS: carrying, lifting, squatting, stairs, and transfers  PARTICIPATION LIMITATIONS: meal prep, cleaning, shopping, community activity, and yard work  PERSONAL FACTORS: Time since onset of injury/illness/exacerbation are also affecting patient's functional outcome.   REHAB POTENTIAL: Excellent  CLINICAL DECISION MAKING: Stable/uncomplicated  EVALUATION COMPLEXITY: Low  PLAN:  PT FREQUENCY: 1x/week  PT DURATION: 4 weeks  PLANNED INTERVENTIONS: 97164- PT Re-evaluation, 97110-Therapeutic exercises, 97530- Therapeutic activity, 97112- Neuromuscular re-education, 97535- Self Care, [id number]- Manual therapy, [phone]- Gait training, Patient/Family education, Balance training, Stair training, Joint mobilization, Cognitive remediation, and DME instructions  PLAN FOR NEXT SESSION: Issue HEP, work on backward walking, tandem walking, walking with head turns   Raj LOISE Blanch, PT 02/10/2024, 8:04 AM

## 2024-02-15 ENCOUNTER — Ambulatory Visit: Admitting: Occupational Therapy

## 2024-02-15 ENCOUNTER — Ambulatory Visit: Payer: Self-pay | Admitting: Speech Pathology

## 2024-02-17 ENCOUNTER — Encounter: Payer: Self-pay | Admitting: Speech Pathology

## 2024-02-17 ENCOUNTER — Ambulatory Visit

## 2024-02-17 ENCOUNTER — Ambulatory Visit: Admitting: Speech Pathology

## 2024-02-17 ENCOUNTER — Ambulatory Visit: Admitting: Occupational Therapy

## 2024-02-17 DIAGNOSIS — R278 Other lack of coordination: Secondary | ICD-10-CM

## 2024-02-17 DIAGNOSIS — R4701 Aphasia: Secondary | ICD-10-CM

## 2024-02-17 DIAGNOSIS — R2681 Unsteadiness on feet: Secondary | ICD-10-CM

## 2024-02-17 DIAGNOSIS — R41840 Attention and concentration deficit: Secondary | ICD-10-CM

## 2024-02-17 DIAGNOSIS — M6281 Muscle weakness (generalized): Secondary | ICD-10-CM

## 2024-02-17 DIAGNOSIS — I6300 Cerebral infarction due to thrombosis of unspecified precerebral artery: Secondary | ICD-10-CM

## 2024-02-17 DIAGNOSIS — R41842 Visuospatial deficit: Secondary | ICD-10-CM

## 2024-02-17 DIAGNOSIS — R41841 Cognitive communication deficit: Secondary | ICD-10-CM

## 2024-02-17 DIAGNOSIS — R2689 Other abnormalities of gait and mobility: Secondary | ICD-10-CM

## 2024-02-17 NOTE — Therapy (Signed)
 OUTPATIENT PHYSICAL THERAPY NEURO TREATMENT NOTE   Patient Name: Alan Wise MRN: [id number] DOB:1970-09-01, 53 y.o., male Today's Date: 02/17/2024   PCP: Angeline Laura, NP REFERRING PROVIDER: Toribio Pitch, PA-C  END OF SESSION:  PT End of Session - 02/17/24 0843     Visit Number 4    Number of Visits 5    Date for PT Re-Evaluation 02/22/24    Authorization Type [employer]    PT Start Time 0845    PT Stop Time 0930    PT Time Calculation (min) 45 min    Equipment Utilized During Treatment Gait belt    Activity Tolerance Patient tolerated treatment well    Behavior During Therapy WFL for tasks assessed/performed           Past Medical History:  Diagnosis Date   Allergy    Complication of anesthesia    pt reports he is starting to have more difficulty arousing after surgery   Diabetes mellitus (HCC)    GERD (gastroesophageal reflux disease)    Headache    History of kidney stones    Hyperlipidemia    Renal disorder    kidney stones   Past Surgical History:  Procedure Laterality Date   APPLICATION OF CRANIAL NAVIGATION Right 01/17/2021   Procedure: APPLICATION OF CRANIAL NAVIGATION;  Surgeon: Debby Dorn MATSU, MD;  Location: [facility] OR;  Service: Neurosurgery;  Laterality: Right;   COLONOSCOPY  09/03/1994   COLONOSCOPY WITH PROPOFOL  N/A 01/07/2023   Procedure: COLONOSCOPY WITH PROPOFOL ;  Surgeon: Jinny Carmine, MD;  Location: [facility] ENDOSCOPY;  Service: Endoscopy;  Laterality: N/A;  NOT TOO EARLY   CYSTOSCOPY WITH STENT PLACEMENT Right 01/25/2016   Procedure: CYSTOSCOPY WITH STENT PLACEMENT;  Surgeon: Redell Lynwood Napoleon, MD;  Location: [facility] ORS;  Service: Urology;  Laterality: Right;   FRAMELESS  BIOPSY WITH BRAINLAB Right 01/17/2021   Procedure: RIGHT STEREOTACTIC BIOPSY OF INSULAR LESION;  Surgeon: Debby Dorn MATSU, MD;  Location: [facility] OR;  Service: Neurosurgery;  Laterality: Right;   KNEE ARTHROSCOPY W/ MENISCAL REPAIR Right 06/23/1988   POLYPECTOMY   01/07/2023   Procedure: POLYPECTOMY;  Surgeon: Jinny Carmine, MD;  Location: [facility] ENDOSCOPY;  Service: Endoscopy;;   removal of birthmark  06/08/2013   SKIN CANCER EXCISION  06/23/2012   TYMPANOSTOMY TUBE PLACEMENT  08/06/1978   URETEROSCOPY WITH HOLMIUM LASER LITHOTRIPSY Right 01/25/2016   Procedure: URETEROSCOPY WITH HOLMIUM LASER LITHOTRIPSY;  Surgeon: Redell Lynwood Napoleon, MD;  Location: [facility] ORS;  Service: Urology;  Laterality: Right;   URETHRAL STRICTURE DILATATION     visual inspection of vocal cord     1973   WISDOM TOOTH EXTRACTION     Patient Active Problem List   Diagnosis Date Noted   Nausea and vomiting 01/07/2024   Malnutrition of moderate degree 01/01/2024   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) 12/30/2023   Acute right ACA stroke (HCC) 12/24/2023   Acute CVA (cerebrovascular accident) (HCC) 12/18/2023   Psychomotor agitation 12/11/2023   Sepsis due to gram-negative UTI (HCC) 12/09/2023   Abnormal brain MRI 12/09/2023   Dyslipidemia 12/09/2023   Dysphagia 09/14/2023   Gait disorder 09/14/2023   Seizure disorder (HCC) 09/10/2023   History of CVA (cerebrovascular accident) 09/09/2023   Essential hypertension 09/09/2023   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   Type 2 diabetes mellitus with hyperlipidemia (HCC) 06/12/2014   Hyperlipidemia associated with type 2 diabetes mellitus (HCC) 06/12/2014  ONSET DATE: 10/18/23  REFERRING DIAG: acute right ACA stroke  THERAPY DIAG:  Other abnormalities of gait and mobility  Unsteadiness on feet  Cerebrovascular accident (CVA) due to thrombosis of precerebral artery (HCC)  Muscle weakness (generalized)  Rationale for Evaluation and Treatment: Rehabilitation  SUBJECTIVE:                                                                                                                                                                                              SUBJECTIVE STATEMENT: Pt present in therapy gym by himself. Pt reports no falls at home Pt accompanied by: self and wife  PERTINENT HISTORY: PMHx: T2DM, GERD, CVA w/ L sided weakness, oligodendroglioma  PAIN:  Are you having pain? No  PRECAUTIONS: Fall  RED FLAGS: None   WEIGHT BEARING RESTRICTIONS: No  FALLS: Has patient fallen in last 6 months? Yes. Number of falls 1  LIVING ENVIRONMENT: Lives with: lives with their spouse Lives in: House/apartment Stairs: Yes: Internal: 13 steps Has following equipment at home: Walker - 2 wheeled and None  PLOF: Needs assistance with homemaking  PATIENT GOALS: improve balance  OBJECTIVE:  Note: Objective measures were completed at Evaluation unless otherwise noted.  DIAGNOSTIC FINDINGS: 01/15/24 IMPRESSION: 1. Interval evolution of the nonhemorrhagic infarcts involving the right single lid gyrus, right basal ganglia and periventricular white matter and left lentiform nucleus. 2. Stable appearance of right frontal lobe previously treated neoplasm, with hemosiderin staining from prior hemorrhage and surrounding gliosis. No adverse interval change.  COGNITION: Overall cognitive status: Difficulty to assess due to: Communication impairment    LOWER EXTREMITY ROM:     Active  Right Eval Left Eval  Hip flexion    Hip extension    Hip abduction    Hip adduction    Hip internal rotation    Hip external rotation    Knee flexion    Knee extension    Ankle dorsiflexion    Ankle plantarflexion    Ankle inversion    Ankle eversion     (Blank rows = not tested)  LOWER EXTREMITY MMT:    MMT Right Eval Left Eval  Hip flexion    Hip extension    Hip abduction    Hip adduction    Hip internal rotation    Hip external rotation    Knee flexion    Knee extension    Ankle dorsiflexion 5/5 5/5  Ankle plantarflexion    Ankle inversion    Ankle eversion    (Blank rows = not  tested) GAIT: Findings: Gait Characteristics: poor foot clearance- Right and poor foot clearance- Left,  Distance walked: 230', and Comments: Pt has occasional steppage gait which can occur left or right foot and is not due to foot drop. According to patient he has always done that since he was young (before his 2 strokes and chemo/radiation treatments). Pt denies falls from this steppage gait.  FUNCTIONAL TESTS:  5 times sit to stand: 11.20 sec (01/25/24) 10 meter walk test: 1.1 m/s without AD (01/25/24) Functional gait assessment:  FUNCTIONAL GAIT ASSESSMENT  Date: 01/25/24 Score  GAIT LEVEL SURFACE Instructions: Walk at your normal speed from here to the next mark (6 m) [20 ft]. (3) Normal - Walks 6 m (20 ft) in less than 5.5 seconds, no assistive devices, good speed, no evidence for imbalance, normal gait pattern, deviates no more than 15.24 cm (6 in) outside of the 30.48-cm (12-in) walkway width.  2.   CHANGE IN GAIT SPEED Instructions: Begin walking at your normal pace (for 1.5 m [5 ft]). When I tell you "go," walk as fast as you can (for 1.5 m [5 ft]). When I tell you "slow," walk as slowly as you can (for 1.5 m [5 ft]. (3) Normal - Able to smoothly change walking speed without loss of balance or gait deviation. Shows a significant difference in walking speeds between normal, fast, and slow speeds. Deviates no more than 15.24 cm (6 in) outside of the 30.48-cm (12-in) walkway width.  3.    GAIT WITH HORIZONTAL HEAD TURNS Instructions: Walk from here to the next mark 6 m (20 ft) away. Begin walking at your normal pace. Keep walking straight; after 3 steps, turn your head to the right and keep walking straight while looking to the right. After 3 more steps, turn your head to the left and keep walking straight while looking left. Continue alternating looking right and left. (2) Mild impairment - Performs head turns smoothly with slight change in gait velocity (eg, minor disruption to smooth gait path),  deviates 15.24 -25.4 cm (6 -10 in) outside 30.48-cm (12-in) walkway width, or uses an assistive device.  4.   GAIT WITH VERTICAL HEAD TURNS Instructions: Walk from here to the next mark (6 m [20 ft]). Begin walking at your normal pace. Keep walking straight; after 3 steps, tip your head up and keep walking straight while looking up. After 3 more steps, tip your head down, keep walking straight while looking down. Continue  alternating looking up and down every 3 steps until you have completed 2 repetitions in each direction. (3) Normal - Performs head turns with no change in gait. Deviates no more than 15.24 cm (6 in) outside 30.48-cm (12-in) walkway width.  5.  GAIT AND PIVOT TURN Instructions: Begin with walking at your normal pace. When I tell you, "turn and stop," turn as quickly as you can to face the opposite direction and stop. (3) Normal - Pivot turns safely within 3 seconds and stops quickly with no loss of balance  6.   STEP OVER OBSTACLE Instructions: Begin walking at your normal speed. When you come to the shoe box, step over it, not around it, and keep walking. (3) Normal - Is able to step over 2 stacked shoe boxes taped together (22.86 cm [9 in] total height) without changing gait speed; no evidence of imbalance.  7.   GAIT WITH NARROW BASE OF SUPPORT Instructions: Walk on the floor with arms folded across the chest, feet aligned heel to toe in tandem for a distance of 3.6 m [12 ft]. The number of  steps taken in a straight line are counted for a maximum of 10 steps. (1) Moderate impairment - Ambulates 4 -7 steps.  8.   GAIT WITH EYES CLOSED Instructions: Walk at your normal speed from here to the next mark (6 m [20 ft]) with your eyes closed. (3) Normal - Walks 6 m (20 ft), no assistive devices, good speed, no evidence of imbalance, normal gait pattern, deviates no more than 15.24 cm (6 in) outside 30.48-cm (12-in) walkway width. Ambulates 6 m (20 ft) in less than 7 seconds.  9.   AMBULATING  BACKWARDS Instructions: Walk backwards until I tell you to stop (1) Moderate impairment - Walks 6 m (20 ft), slow speed, abnormal gait pattern, evidence for imbalance, deviates 25.4 -38.1 cm (10 -15 in) outside 30.48-cm (12-in) walkway width.  10. STEPS Instructions: Walk up these stairs as you would at home (ie, using the rail if necessary). At the top turn around and walk down. (1) Moderate impairment-Two feet to a stair; must use rail.  Total 24/30   Interpretation of scores: Non-Specific Older Adults Cutoff Score: <=22/30 = risk of falls Parkinson's Disease Cutoff score <15/30= fall risk (Hoehn & Yahr 1-4)  Minimally Clinically Important Difference (MCID)  Stroke (acute, subacute, and chronic) = MDC: 4.2 points Vestibular (acute) = MDC: 6 points Community Dwelling Older Adults =  MCID: 4 points Parkinson's Disease  =  MDC: 4.3 points  ([employer]y [employer] (nd). Functional Gait Assessment. Retrieved from https://www.neuropt.org/docs/default-source/cpgs/core-outcome-measures/function-gait-assessment-pocket-guide-proof9-(2).pdf?sfvrsn=b90f35043_0.)D                                                                                                                              TREATMENT DATE:   02/17/24  As patient was walking to [employer], there were few cones on the floor placed by other therapists in the gym. Pt was given VC to watch out for the cones on the floor before he walked through them and was asked to walk to R side. Pt still hit one of the cones and knocked it off the floor, demonstrating poor enviornmental scanning and lack of attention.   [employer]: level 3 for 10' for cardiovascular conditioning, pt intermittently rested R UE- pt was asked if R shoulder was hurting- pt denied pain.  Obstacle course management and environmental scanning: Obstacles on floor: 6 cones, 4 beam, 6 step, airex cushion: Pt to weave in and out of cones, step over 4 beam and step on box  and step on/off airex cushion then walk 115' path, navigating any other human/environmental obstacles:  2 course of obstacles + 56' with min A: performed 5x each- pt required moderate cueing to avoid hitting the obstacles, scanning environment with eyes/head movement, taking wider turns. Needed mod A 3x to prevent LOB from his feet catching on the floor.  Walking balance beam: 6 lengths: 2x, pt was asked to Give me your left hand or right hand. With left hand, pt bends elbow but required  moderate cueing and repetitions to bring hand to therapist's hand. Pt also getting slightly irritated when asked multiple times to Give me your left hand. During the second set, pt was able to follow directions better with give me your left hand and didn't require more than 1-2 cues.  Pt educated on looking down 2-3 feet when walking around the obstacles and constantly scanning R to L and L to R to ensure he can clear the path of walking around the obstacles       PATIENT EDUCATION: Education details: see above Person educated: Patient Education method: Explanation Education comprehension: verbalized understanding  HOME EXERCISE PROGRAM: TBD  GOALS: Goals reviewed with patient? Yes SHORT TERM GOALS: no STG as POC<4 weeks.   LONG TERM GOALS: Target date: 02/22/2024    Patient will demo 5x sit to stand score of <10 sec to improve overall functional strength. Baseline: 34 s without UE support; 19 seconds (11/18/23); 10.25 seconds (11/30/23); 11.19 sec (01/25/24) Goal status: Initial   2.  Pt will demo functional gait assessment score of >2730 to improve overall balance with functional gait. Baseline: 20/30 (eval); 21/30 (11/18/23); 24/30 (01/25/24) Goal status: Progressing continue  ASSESSMENT:  CLINICAL IMPRESSION: Today's session was focused on environmental scanning and obstacle avoidance to improve safety and reduce fall risk. Pt still tends to run into objects/people and requires some VC. Pt  also had trips which he reports that has been doing for years without any falls.  OBJECTIVE IMPAIRMENTS: Abnormal gait, decreased balance, decreased knowledge of condition, and difficulty walking.   ACTIVITY LIMITATIONS: carrying, lifting, squatting, stairs, and transfers  PARTICIPATION LIMITATIONS: meal prep, cleaning, shopping, community activity, and yard work  PERSONAL FACTORS: Time since onset of injury/illness/exacerbation are also affecting patient's functional outcome.   REHAB POTENTIAL: Excellent  CLINICAL DECISION MAKING: Stable/uncomplicated  EVALUATION COMPLEXITY: Low  PLAN:  PT FREQUENCY: 1x/week  PT DURATION: 4 weeks  PLANNED INTERVENTIONS: 97164- PT Re-evaluation, 97110-Therapeutic exercises, 97530- Therapeutic activity, 97112- Neuromuscular re-education, 97535- Self Care, [id number]- Manual therapy, [phone]- Gait training, Patient/Family education, Balance training, Stair training, Joint mobilization, Cognitive remediation, and DME instructions  PLAN FOR NEXT SESSION: Issue HEP, work on backward walking, tandem walking, walking with head turns   Raj LOISE Blanch, PT 02/17/2024, 9:44 AM

## 2024-02-17 NOTE — Therapy (Signed)
 OUTPATIENT SPEECH LANGUAGE PATHOLOGY APHASIA TREATMENT   Patient Name: Alan Wise MRN: [id number] DOB:07-02-1970, 53 y.o., male Today's Date: 02/17/2024  PCP: Angeline Daryle Amis, NP REFERRING PROVIDER: Toribio Pitch, PA-C  END OF SESSION:  End of Session - 02/17/24 0941     Visit Number 6    Number of Visits 25    Date for SLP Re-Evaluation 04/14/24    Authorization Type [employer]    SLP Start Time 1015    SLP Stop Time  1100    SLP Time Calculation (min) 45 min    Activity Tolerance Patient tolerated treatment well            Past Medical History:  Diagnosis Date   Allergy    Complication of anesthesia    pt reports he is starting to have more difficulty arousing after surgery   Diabetes mellitus (HCC)    GERD (gastroesophageal reflux disease)    Headache    History of kidney stones    Hyperlipidemia    Renal disorder    kidney stones   Past Surgical History:  Procedure Laterality Date   APPLICATION OF CRANIAL NAVIGATION Right 01/17/2021   Procedure: APPLICATION OF CRANIAL NAVIGATION;  Surgeon: Debby Dorn MATSU, MD;  Location: [facility] OR;  Service: Neurosurgery;  Laterality: Right;   COLONOSCOPY  09/03/1994   COLONOSCOPY WITH PROPOFOL  N/A 01/07/2023   Procedure: COLONOSCOPY WITH PROPOFOL ;  Surgeon: Jinny Carmine, MD;  Location: [facility] ENDOSCOPY;  Service: Endoscopy;  Laterality: N/A;  NOT TOO EARLY   CYSTOSCOPY WITH STENT PLACEMENT Right 01/25/2016   Procedure: CYSTOSCOPY WITH STENT PLACEMENT;  Surgeon: Redell Lynwood Napoleon, MD;  Location: [facility] ORS;  Service: Urology;  Laterality: Right;   FRAMELESS  BIOPSY WITH BRAINLAB Right 01/17/2021   Procedure: RIGHT STEREOTACTIC BIOPSY OF INSULAR LESION;  Surgeon: Debby Dorn MATSU, MD;  [facility] OR;  Service: Neurosurgery;  Laterality: Right;   KNEE ARTHROSCOPY W/ MENISCAL REPAIR Right 06/23/1988   POLYPECTOMY  01/07/2023   Procedure: POLYPECTOMY;  Surgeon: Jinny Carmine, MD;  Location: [facility] ENDOSCOPY;   Service: Endoscopy;;   removal of birthmark  06/08/2013   SKIN CANCER EXCISION  06/23/2012   TYMPANOSTOMY TUBE PLACEMENT  08/06/1978   URETEROSCOPY WITH HOLMIUM LASER LITHOTRIPSY Right 01/25/2016   Procedure: URETEROSCOPY WITH HOLMIUM LASER LITHOTRIPSY;  Surgeon: Redell Lynwood Napoleon, MD;  Location: [facility] ORS;  Service: Urology;  Laterality: Right;   URETHRAL STRICTURE DILATATION     visual inspection of vocal cord     1973   WISDOM TOOTH EXTRACTION     Patient Active Problem List   Diagnosis Date Noted   Nausea and vomiting 01/07/2024   Malnutrition of moderate degree 01/01/2024   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) 12/30/2023   Acute right ACA stroke (HCC) 12/24/2023   Acute CVA (cerebrovascular accident) (HCC) 12/18/2023   Psychomotor agitation 12/11/2023   Sepsis due to gram-negative UTI (HCC) 12/09/2023   Abnormal brain MRI 12/09/2023   Dyslipidemia 12/09/2023   Dysphagia 09/14/2023   Gait disorder 09/14/2023   Seizure disorder (HCC) 09/10/2023   History of CVA (cerebrovascular accident) 09/09/2023   Essential hypertension 09/09/2023   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   Type 2 diabetes mellitus with hyperlipidemia (HCC) 06/12/2014   Hyperlipidemia associated with type 2 diabetes mellitus (HCC) 06/12/2014    ONSET DATE: 01/14/24 referred   REFERRING DIAG: Acute right ACA stroke   THERAPY DIAG: Aphasia  Cognitive communication deficit  Rationale for Evaluation and Treatment: Rehabilitation  SUBJECTIVE:   SUBJECTIVE STATEMENT: Opening conversation c/b vague speech, perseverations, and circumlocution Pt accompanied by: significant other  PERTINENT HISTORY: history of T2DM- A1c 5.4, GERD, renal calculi, 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 Dr. Buckley felt this was secondary to evolving radiation necrosis v/s novel small vessel infracts and was changed to Eliquis  and abilify   briefly added for ongoing behavorial issues.  He was readmitted on 12/18/23 with recurrent falls with left sided weakness. Repeat MRI brain done revaling new 3.2 X 1.3 cm focus of right basal ganglia infarct  and recent right temporal restricted diffusion no longer present. EEG was negative for seizures and showed cortical dysfunction arising from right fronto-temporal region likely due to underlying structural abnormality. Neurology recommended increasing Lamictal  dose for possible partial seizures.    PAIN:  Are you having pain? No   PLOF:  Level of assistance: Independent with ADLs Employment: On disability  PATIENT GOALS: pt unable to state, spouse desires improved communication, decreased frustration  OBJECTIVE:  Note: Objective measures were completed at Evaluation unless otherwise noted.  Inpatient Rehab ST: Clinical Impression/Discharge Summary:  Pt made slight progress this stay, as evidenced by mastery of 5/5 LTGs. Severe to profound expressive and moderate receptive aphasia remain at this time. He requires cues to clear L buccal cavity and maintain slow rate, but is tolerating D3 textures/thin liquids w/ minA. Frustration tolerance and receptiveness to education/cueing will continue to limit his overall progress, however, recommend cont ST upon d/c to target remaining deficits, maximize pt communication, and maximize pt independence.   COGNITION: Overall cognitive status: Difficulty to assess due to: Communication impairment  AUDITORY COMPREHENSION: Overall auditory comprehension: Impaired: simple, moderately complex, and complex YES/NO questions: Impaired: moderately complex  and complex Following directions: Appears intact, single step. Did not attempt multi-step Conversation: Impaired, Simple Interfering components: attention, processing speed, and working memory Effective technique: extra processing time, repetition/stressing words, slowed speech, and stressing words  READING COMPREHENSION: Impaired: L neglect  EXPRESSION: verbal  VERBAL EXPRESSION: Level of generative/spontaneous verbalization: conversation Automatic speech: name: impaired, social response: impaired, counting: intact, and day of week: impaired  Repetition: Impaired: Word Naming: Confrontation: 0% accuracy Pragmatics: Impaired: abnormal effect, topic appropriateness, topic maintenance, and turn taking Interfering components: auditory comprehension, attention  Effective technique: sentence completion, phonemic cues, and articulatory cues Non-verbal means of communication: none  WRITTEN EXPRESSION: Dominant hand: right Written expression: Impaired: word  MOTOR SPEECH: Overall motor speech: Appears intact  ORAL MOTOR EXAMINATION: Overall status: Impaired:   Lingual: Left (ROM and Symmetry)  STANDARDIZED ASSESSMENTS: QAB severe impairment Word comprehension 10.00  Sentence comprehension 0.83  Word finding 0.00  Grammatical construction 2.00  Speech motor programming 10.00  Repetition 0.00  Reading 7.92  QAB overall 4.02   PATIENT REPORTED OUTCOME MEASURES (PROM): Deferred d/t severity of impairment  TREATMENT DATE:   02/17/24: Pt arrives alone - does not have SGD. He was not scheduled to see me but I had a cancellation. Targeted word finding and written expression in personally relevant categories - with fill in the blank cues, Yancey named 6 stores they frequent and wrote them with usual mod cues to correct errors. Due to hand  fatigue, we stopped writing-  named his 7 burger toppings and 5 school supplies with fill in the blank cues - after Areg read the lists with extended time. In conversation he used verbal compensation of yay to express he was glad to be seen alone today. Attempts to use gestures result unsuccessful despite max A. Instructed him to bring SGD next visit  02/10/24: Targeted training on Touch Talk device - [name] added 10 icons, including folders for places and biographical information. Virlan selected icons to consistent written and verbal cues with cues to scan left, 8/10x with extended time. Targeted written expression while [name] was customizing device - he wrote family names with occasional min A to ID errors and consistent copy cues to correct errors wrote 4/5 family names correctly, 3 fruits and 3 tools, again with copy cues required to correct errors.  02/08/24: Introduced Touch Talk AAC device - demonstrated how to add icon and folder, use the photo option. 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 She is aware that she will need to initiate use of Touch Talk. Alfie had multiple episodes of becoming belligerent and agitated shaking his fists and yelling out. [name] reports this happens throughout the day and he has lashed out physically a few times, hitting her arm. He is up at night, waking her without awareness. Overall awareness, attention and decision making severely impaired. Encouraged him to try Seroquel  as prescribed by his MD. Tinnie reports he refuses to take new meds, today he agreed to try this med tonight. Psych consult pending.   02/02/24: Utilize interest and then tolerate to generate list of salient topics for future SGD customization.  Patient identifies the following: Eating out, family, food and meals, movies and TV, news and current events, personal needs, pets, sports, conflict.  For food and meal patient would benefit from folder dedicated to getting instruction to wife for preparation of  preferred meals, including ingredients.  For support patient would benefit from folder for conversation with kids regarding their sports involvement including soccer T-ball and basketball.  Utilizing [facility] board and writing as multimodal communication patient correctly answered 4 of 5 personal questions.  ABC board provided for use at home as patient continues to benefit from first letter cues for anomia repair.   01/26/24: Given significant aphasia, trialed multimodal communication today, including writing and [employer] TouchTalk device. Pt able to write wife's name with min error to augment naming. Otherwise, writing inconsistent with perseverations and agraphia. Demonstrated how to functionally utilize TouchTalk device for naming preferred foods items. Good oral reading accuracy exhibited. Able to ID preferred choice f/o 3 with good accuracy. Generated AAC categories related to his children. Required usual max A to name interests of children. Cued gestures were beneficial x2. Pt and wife interested in pursuing SGD trials, with SLP submitting information this session.  01/21/24: Explained rationale for speech generating device recommendation. Discussed benefits of increased independence with communication and to support rehabilitation. Discussed severity of impairment and prior level of functioning supporting likelihood of slow rehab course, and benefit of SGD for improved communication while rehabilitating. Pt does not desire AAC at  this time   PATIENT EDUCATION: Education details: POC/goals Person educated: Patient and Spouse Education method: Explanation Education comprehension: verbalized understanding and needs further education   GOALS: Goals reviewed with patient? Yes  SHORT TERM GOALS: Target date: 03/17/2024  Patient will use multimodal communication to communicate basic wants and needs with rare min-A Baseline: Goal status: ONGOING  2.  Patient and caregivers will generate x10  functional home-based targets for low or high tech AAC system  Baseline:  Goal status: ONGOING  3.  Patient will complete word level structured language tasks with mod-A  Baseline:  Goal status: ONGOING  4.  Pt will demonstrate accurate gesture for given target with 80% accuracy  Baseline:  Goal status: ONGOING   LONG TERM GOALS: Target date: 04/14/2024  Pt will use multimodal communication to communicate preferences in simple conversation  Baseline:  Goal status: ONGOING  2.  Pt and caregivers will carryover 3 compensatory strategies to support pt's auditory comprehension  Baseline:  Goal status: ONGOING  3.  Pt and caregiver will demonstrate supportive communication techniques in conversation to support pt expressive language Baseline:  Goal status: ONGOING  4.  Pt and spouse will carryover 1-2 language enhancing activities at home daily 5/7 days  Baseline:  Goal status: ONGOING  5.  Pt will generate moderately complex sentences in structured task such as VNeST with usual min A  Baseline:  Goal status: ONGOING   ASSESSMENT:  CLINICAL IMPRESSION: Patient is a 53 y.o. M who was seen today for aphasia tx. Known to clinic from prior ST course, since has had another stroke. Communication impairment during prior ST course was severe, profound impairment at this time. Pt presenting with fluent aphsia c/b nonsensical speech that lacks meaning, tangential speech, usual use of a few rote phrases repetitively (the thing, the thing, that which is). During picture description task, pt says that, that, that with no content.  Benefit observed from phonemic and phrase completion cues to aid in anomia recovery.  Patient unable to repeat, challenges with answering yes no questions.  Per spouse primary means of communication was asking yes/no questions prior to most recent stroke, this new impairment has significantly impacted her communication and skills in the home environment.  Patient  appears to lack awareness, he typically responds to questions with a long-winded nonsensical speech requiring SLP cues to stop.  Due to severity of impairment and prior level of function I highly recommend implementation of a speech generating device.  At this time patient declines will continue to address in future therapy sessions.  Spouse appears receptive to idea of communication support.   OBJECTIVE IMPAIRMENTS: include attention, awareness, executive functioning, and aphasia. These impairments are limiting patient from effectively communicating at home and in community. Factors affecting potential to achieve goals and functional outcome are previous level of function and severity of impairments. Patient will benefit from skilled SLP services to address above impairments and improve overall function.  REHAB POTENTIAL: Fair (severity of impairments)  PLAN:  SLP FREQUENCY: 2x/week  SLP DURATION: 12 weeks  PLANNED INTERVENTIONS: Language facilitation, Cueing hierachy, Internal/external aids, Functional tasks, Multimodal communication approach, SLP instruction and feedback, Compensatory strategies, and Patient/family education    Mathis Leita Caldron, CCC-SLP 02/17/2024, 11:02 AM

## 2024-02-17 NOTE — Therapy (Signed)
 OUTPATIENT SPEECH LANGUAGE PATHOLOGY APHASIA TREATMENT   Patient Name: Alan Wise MRN: [id number] DOB:1970-07-14, 53 y.o., male Today's Date: 02/17/2024  PCP: Angeline Rheagan Nayak, NP REFERRING PROVIDER: Toribio Pitch, PA-C  END OF SESSION:  End of Session - 02/17/24 0941     Visit Number 6    Number of Visits 25    Date for SLP Re-Evaluation 04/14/24    Authorization Type [employer]    SLP Start Time 1015    SLP Stop Time  1100    SLP Time Calculation (min) 45 min    Activity Tolerance Patient tolerated treatment well            Past Medical History:  Diagnosis Date   Allergy    Complication of anesthesia    pt reports he is starting to have more difficulty arousing after surgery   Diabetes mellitus (HCC)    GERD (gastroesophageal reflux disease)    Headache    History of kidney stones    Hyperlipidemia    Renal disorder    kidney stones   Past Surgical History:  Procedure Laterality Date   APPLICATION OF CRANIAL NAVIGATION Right 01/17/2021   Procedure: APPLICATION OF CRANIAL NAVIGATION;  Surgeon: Debby Dorn MATSU, MD;  Location: [facility] OR;  Service: Neurosurgery;  Laterality: Right;   COLONOSCOPY  09/03/1994   COLONOSCOPY WITH PROPOFOL  N/A 01/07/2023   Procedure: COLONOSCOPY WITH PROPOFOL ;  Surgeon: Jinny Carmine, MD;  Location: [facility] ENDOSCOPY;  Service: Endoscopy;  Laterality: N/A;  NOT TOO EARLY   CYSTOSCOPY WITH STENT PLACEMENT Right 01/25/2016   Procedure: CYSTOSCOPY WITH STENT PLACEMENT;  Surgeon: Redell Lynwood Napoleon, MD;  Location: [facility] ORS;  Service: Urology;  Laterality: Right;   FRAMELESS  BIOPSY WITH BRAINLAB Right 01/17/2021   Procedure: RIGHT STEREOTACTIC BIOPSY OF INSULAR LESION;  Surgeon: Debby Dorn MATSU, MD;  Location: [facility] OR;  Service: Neurosurgery;  Laterality: Right;   KNEE ARTHROSCOPY W/ MENISCAL REPAIR Right 06/23/1988   POLYPECTOMY  01/07/2023   Procedure: POLYPECTOMY;  Surgeon: Jinny Carmine, MD;  Location: [facility] ENDOSCOPY;   Service: Endoscopy;;   removal of birthmark  06/08/2013   SKIN CANCER EXCISION  06/23/2012   TYMPANOSTOMY TUBE PLACEMENT  08/06/1978   URETEROSCOPY WITH HOLMIUM LASER LITHOTRIPSY Right 01/25/2016   Procedure: URETEROSCOPY WITH HOLMIUM LASER LITHOTRIPSY;  Surgeon: Redell Lynwood Napoleon, MD;  Location: [facility] ORS;  Service: Urology;  Laterality: Right;   URETHRAL STRICTURE DILATATION     visual inspection of vocal cord     1973   WISDOM TOOTH EXTRACTION     Patient Active Problem List   Diagnosis Date Noted   Nausea and vomiting 01/07/2024   Malnutrition of moderate degree 01/01/2024   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) 12/30/2023   Acute right ACA stroke (HCC) 12/24/2023   Acute CVA (cerebrovascular accident) (HCC) 12/18/2023   Psychomotor agitation 12/11/2023   Sepsis due to gram-negative UTI (HCC) 12/09/2023   Abnormal brain MRI 12/09/2023   Dyslipidemia 12/09/2023   Dysphagia 09/14/2023   Gait disorder 09/14/2023   Seizure disorder (HCC) 09/10/2023   History of CVA (cerebrovascular accident) 09/09/2023   Essential hypertension 09/09/2023   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   Type 2 diabetes mellitus with hyperlipidemia (HCC) 06/12/2014   Hyperlipidemia associated with type 2 diabetes mellitus (HCC) 06/12/2014    ONSET DATE: 01/14/24 referred   REFERRING DIAG: Acute right ACA stroke   THERAPY DIAG: Aphasia  Cognitive communication deficit  Rationale for Evaluation and Treatment: Rehabilitation  SUBJECTIVE:   SUBJECTIVE STATEMENT: Opening conversation c/b vague speech, perseverations, and circumlocution Pt accompanied by: significant other  PERTINENT HISTORY: history of T2DM- A1c 5.4, GERD, renal calculi, 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 Dr. Buckley felt this was secondary to evolving radiation necrosis v/s novel small vessel infracts and was changed to Eliquis  and abilify   briefly added for ongoing behavorial issues.  He was readmitted on 12/18/23 with recurrent falls with left sided weakness. Repeat MRI brain done revaling new 3.2 X 1.3 cm focus of right basal ganglia infarct  and recent right temporal restricted diffusion no longer present. EEG was negative for seizures and showed cortical dysfunction arising from right fronto-temporal region likely due to underlying structural abnormality. Neurology recommended increasing Lamictal  dose for possible partial seizures.   He continued to have waxing and waning of baseline aphasia w/non verbal state at times and left sided weakness as well as left gaze preference with left hemianopia and left facial droop on 07/01 per exam by Dr. Michaela. 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 MRI brain repeated revealing progressive restricted diffusion mid and distal R-ACA territory infarct, ongoing multifocal right MCA deep white and gray matter unchanged from 06/27 and underlying chronic ischemic disease with encephalomalacia.  PAIN:  Are you having pain? No  FALLS: Has patient fallen in last 6 months?  Yes, Number of falls: 3  LIVING ENVIRONMENT: Lives with: lives with their family Lives in: House/apartment  PLOF:  Level of assistance: Independent with ADLs Employment: On disability  PATIENT GOALS: pt unable to state, spouse desires improved communication, decreased frustration  OBJECTIVE:  Note: Objective measures were completed at Evaluation unless otherwise noted.  Inpatient Rehab ST: Clinical Impression/Discharge Summary:  Pt made slight progress this stay, as evidenced by  mastery of 5/5 LTGs. Severe to profound expressive and moderate receptive aphasia remain at this time. He requires cues to clear L buccal cavity and maintain slow rate, but is tolerating D3 textures/thin liquids w/ minA. Frustration tolerance and receptiveness to education/cueing will continue to limit his overall progress, however, recommend cont ST upon d/c to target remaining deficits, maximize pt communication, and maximize pt independence.   COGNITION: Overall cognitive status: Difficulty to assess due to: Communication impairment  AUDITORY COMPREHENSION: Overall auditory comprehension: Impaired: simple, moderately complex, and complex YES/NO questions: Impaired: moderately complex and complex Following directions: Appears intact, single step. Did not attempt multi-step Conversation: Impaired, Simple Interfering components: attention, processing speed, and working memory Effective technique: extra processing time, repetition/stressing words, slowed speech, and stressing words  READING COMPREHENSION: Impaired: L neglect  EXPRESSION: verbal  VERBAL EXPRESSION: Level of generative/spontaneous verbalization: conversation Automatic speech: name: impaired, social response: impaired, counting: intact, and day of week: impaired  Repetition: Impaired: Word Naming: Confrontation: 0% accuracy Pragmatics: Impaired: abnormal effect, topic appropriateness, topic maintenance, and turn taking Interfering components: auditory comprehension, attention  Effective technique: sentence completion, phonemic cues, and articulatory cues Non-verbal means of communication: none  WRITTEN EXPRESSION: Dominant hand: right Written expression: Impaired: word  MOTOR SPEECH: Overall motor speech: Appears intact  ORAL MOTOR EXAMINATION: Overall status: Impaired:   Lingual: Left (ROM and  Symmetry)  STANDARDIZED ASSESSMENTS: QAB severe impairment Word comprehension 10.00  Sentence comprehension 0.83  Word  finding 0.00  Grammatical construction 2.00  Speech motor programming 10.00  Repetition 0.00  Reading 7.92  QAB overall 4.02   PATIENT REPORTED OUTCOME MEASURES (PROM): Deferred d/t severity of impairment                                                                                                                          TREATMENT DATE:   02/10/24: Targeted training on Touch Talk device - [name] added 10 icons, including folders for places and biographical information. Lyle selected icons to consistent written and verbal cues with cues to scan left, 8/10x with extended time. Targeted written expression while [name] was customizing device - he wrote family names with occasional min A to ID errors and consistent copy cues to correct errors wrote 4/5 family names correctly, 3 fruits and 3 tools, again with copy cues required to correct errors.  02/08/24: Introduced Touch Talk AAC device - demonstrated how to add icon and folder, use the photo option. 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 She is aware that she will need to initiate use of Touch Talk. Arif had multiple episodes of becoming belligerent and agitated shaking his fists and yelling out. [name] reports this happens throughout the day and he has lashed out physically a few times, hitting her arm. He is up at night, waking her without awareness. Overall awareness, attention and decision making severely impaired. Encouraged him to try Seroquel  as prescribed by his MD. Tinnie reports he refuses to take new meds, today he agreed to try this med tonight. Psych consult pending.   02/02/24: Utilize interest and then tolerate to generate list of salient topics for future SGD customization.  Patient identifies the following: Eating out, family, food and meals, movies and TV, news and current events, personal needs, pets, sports, conflict.  For food and meal patient would benefit from folder dedicated to getting instruction to wife  for preparation of preferred meals, including ingredients.  For support patient would benefit from folder for conversation with kids regarding their sports involvement including soccer T-ball and basketball.  Utilizing [facility] board and writing as multimodal communication patient correctly answered 4 of 5 personal questions.  ABC board provided for use at home as patient continues to benefit from first letter cues for anomia repair.   01/26/24: Given significant aphasia, trialed multimodal communication today, including writing and [employer] TouchTalk device. Pt able to write wife's name with min error to augment naming. Otherwise, writing inconsistent with perseverations and agraphia. Demonstrated how to functionally utilize TouchTalk device for naming preferred foods items. Good oral reading accuracy exhibited. Able to ID preferred choice f/o 3 with good accuracy. Generated AAC categories related to his children. Required usual max A to name interests of children. Cued gestures were beneficial x2. Pt and wife interested in pursuing SGD trials, with SLP submitting information this session.  01/21/24: Explained rationale for speech generating device recommendation. Discussed benefits of increased independence with communication and to support rehabilitation. Discussed severity of impairment and prior level of functioning supporting likelihood of slow rehab course, and benefit of SGD for improved communication while rehabilitating. Pt does not desire AAC at this time   PATIENT EDUCATION: Education details: POC/goals Person educated: Patient and Spouse Education method: Explanation Education comprehension: verbalized understanding and needs further education   GOALS: Goals reviewed with patient? Yes  SHORT TERM GOALS: Target date: 03/17/2024  Patient will use multimodal communication to communicate basic wants and needs with rare min-A Baseline: Goal status: ONGOING  2.  Patient and caregivers will  generate x10 functional home-based targets for low or high tech AAC system  Baseline:  Goal status: ONGOING  3.  Patient will complete word level structured language tasks with mod-A  Baseline:  Goal status: ONGOING  4.  Pt will demonstrate accurate gesture for given target with 80% accuracy  Baseline:  Goal status: ONGOING   LONG TERM GOALS: Target date: 04/14/2024  Pt will use multimodal communication to communicate preferences in simple conversation  Baseline:  Goal status: ONGOING  2.  Pt and caregivers will carryover 3 compensatory strategies to support pt's auditory comprehension  Baseline:  Goal status: ONGOING  3.  Pt and caregiver will demonstrate supportive communication techniques in conversation to support pt expressive language Baseline:  Goal status: ONGOING  4.  Pt and spouse will carryover 1-2 language enhancing activities at home daily 5/7 days  Baseline:  Goal status: ONGOING  5.  Pt will generate moderately complex sentences in structured task such as VNeST with usual min A  Baseline:  Goal status: ONGOING   ASSESSMENT:  CLINICAL IMPRESSION: Patient is a 53 y.o. M who was seen today for aphasia tx. Known to clinic from prior ST course, since has had another stroke. Communication impairment during prior ST course was severe, profound impairment at this time. Pt presenting with fluent aphsia c/b nonsensical speech that lacks meaning, tangential speech, usual use of a few rote phrases repetitively (the thing, the thing, that which is). During picture description task, pt says that, that, that with no content.  Benefit observed from phonemic and phrase completion cues to aid in anomia recovery.  Patient unable to repeat, challenges with answering yes no questions.  Per spouse primary means of communication was asking yes/no questions prior to most recent stroke, this new impairment has significantly impacted her communication and skills in the home  environment.  Patient appears to lack awareness, he typically responds to questions with a long-winded nonsensical speech requiring SLP cues to stop.  Due to severity of impairment and prior level of function I highly recommend implementation of a speech generating device.  At this time patient declines will continue to address in future therapy sessions.  Spouse appears receptive to idea of communication support.   OBJECTIVE IMPAIRMENTS: include attention, awareness, executive functioning, and aphasia. These impairments are limiting patient from effectively communicating at home and in community. Factors affecting potential to achieve goals and functional outcome are previous level of function and severity of impairments. Patient will benefit from skilled SLP services to address above impairments and improve overall function.  REHAB POTENTIAL: Fair (severity of impairments)  PLAN:  SLP FREQUENCY: 2x/week  SLP DURATION: 12 weeks  PLANNED INTERVENTIONS: Language facilitation, Cueing hierachy, Internal/external aids, Functional tasks, Multimodal communication approach, SLP instruction and feedback, Compensatory strategies, and Patient/family education    Shunte Senseney, Leita Caldron,  CCC-SLP 02/17/2024, 9:42 AM

## 2024-02-17 NOTE — Therapy (Signed)
 OUTPATIENT OCCUPATIONAL THERAPY NEURO TREATMENT  Patient Name: Alan Wise MRN: [id number] DOB:04-26-1971, 53 y.o., male Today's Date: 02/17/2024  PCP: Alan Wise ORN, NP  REFERRING PROVIDER: Pegge Wise PARAS, PA-C  END OF SESSION:  OT End of Session - 02/17/24 0934     Visit Number 6    Number of Visits 16    Date for OT Re-Evaluation 03/22/24    Authorization Type [employer] 2025 OOPM MET    Authorization Time Period Covered 100% VL:MN Auth Not Reqd    OT Start Time [id number]    OT Stop Time 1015    OT Time Calculation (min) 41 min    Activity Tolerance Patient tolerated treatment well    Behavior During Therapy Agitated;Impulsive          Past Medical History:  Diagnosis Date   Allergy    Complication of anesthesia    pt reports he is starting to have more difficulty arousing after surgery   Diabetes mellitus (HCC)    GERD (gastroesophageal reflux disease)    Headache    History of kidney stones    Hyperlipidemia    Renal disorder    kidney stones   Past Surgical History:  Procedure Laterality Date   APPLICATION OF CRANIAL NAVIGATION Right 01/17/2021   Procedure: APPLICATION OF CRANIAL NAVIGATION;  Surgeon: Alan Dorn MATSU, MD;  Location: [facility] OR;  Service: Neurosurgery;  Laterality: Right;   COLONOSCOPY  09/03/1994   COLONOSCOPY WITH PROPOFOL  N/A 01/07/2023   Procedure: COLONOSCOPY WITH PROPOFOL ;  Surgeon: Alan Carmine, MD;  Location: [facility] ENDOSCOPY;  Service: Endoscopy;  Laterality: N/A;  NOT TOO EARLY   CYSTOSCOPY WITH STENT PLACEMENT Right 01/25/2016   Procedure: CYSTOSCOPY WITH STENT PLACEMENT;  Surgeon: Alan Lynwood Napoleon, MD;  Location: [facility] ORS;  Service: Urology;  Laterality: Right;   FRAMELESS  BIOPSY WITH BRAINLAB Right 01/17/2021   Procedure: RIGHT STEREOTACTIC BIOPSY OF INSULAR LESION;  Surgeon: Alan Dorn MATSU, MD;  Location: [facility] OR;  Service: Neurosurgery;  Laterality: Right;   KNEE ARTHROSCOPY W/ MENISCAL REPAIR Right 06/23/1988    POLYPECTOMY  01/07/2023   Procedure: POLYPECTOMY;  Surgeon: Alan Carmine, MD;  Location: [facility] ENDOSCOPY;  Service: Endoscopy;;   removal of birthmark  06/08/2013   SKIN CANCER EXCISION  06/23/2012   TYMPANOSTOMY TUBE PLACEMENT  08/06/1978   URETEROSCOPY WITH HOLMIUM LASER LITHOTRIPSY Right 01/25/2016   Procedure: URETEROSCOPY WITH HOLMIUM LASER LITHOTRIPSY;  Surgeon: Alan Lynwood Napoleon, MD;  Location: [facility] ORS;  Service: Urology;  Laterality: Right;   URETHRAL STRICTURE DILATATION     visual inspection of vocal cord     1973   WISDOM TOOTH EXTRACTION     Patient Active Problem List   Diagnosis Date Noted   Nausea and vomiting 01/07/2024   Malnutrition of moderate degree 01/01/2024   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) 12/30/2023   Acute right ACA stroke (HCC) 12/24/2023   Acute CVA (cerebrovascular accident) (HCC) 12/18/2023   Psychomotor agitation 12/11/2023   Sepsis due to gram-negative UTI (HCC) 12/09/2023   Abnormal brain MRI 12/09/2023   Dyslipidemia 12/09/2023   Dysphagia 09/14/2023   Gait disorder 09/14/2023   Seizure disorder (HCC) 09/10/2023   History of CVA (cerebrovascular accident) 09/09/2023   Essential hypertension 09/09/2023   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 Pt agreed  Pt aphasic - verbal but unable to communicate  what he wanted. Pt did have occasional automatic appropriate responses.  Wife reports he had first stroke in March, possibly another smaller stroke, then this new stroke end of June with worsened symptoms. 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  Presented to the hospital 09/14/2023 with worsening gait left-sided weakness/aphasia and new onset dysphagia.  Hospitalized from 3-19 to 3-21 with CVA and breakthrough seizures.    PRECAUTIONS: Fall and Other: impulsive, decreased awareness, Lt visual field loss, seizures (recent one in beginning of July - 7th or 8th)   WEIGHT BEARING RESTRICTIONS: No  PAIN:  Are you having pain? No  FALLS: Has patient fallen in last 6 months? Yes. Number of falls 3  LIVING ENVIRONMENT: Lives with: lives with their family (wife, 51 y.o. dtr, 36 y.o. 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  HAND DOMINANCE: Left, but now using Rt hand  ADLs: Overall ADLs: Pt requires 24 hr supervison Transfers/ambulation related to ADLs: pt not using walker safety/correctly often lifting it Eating: mod I eating with Rt  hand since March (first stroke), wife cuts food  Grooming: mod I with Rt hand, wife trimming beard and/or shaving UB Dressing: independent LB Dressing: assist for Lt shoe and sock, and occasionally RT sock Toileting: independent Bathing: distant sup (wife reports she's not allowed in there - pt will not let her assist)  Tub Shower transfers: mod I  Equipment: none  IADLs: dep for all IADLS Community mobility: Has not driven since Nov 2024 d/t seizure, then more seizures, then first stroke Medication management: wife giving pt all medications Financial management: dep Handwriting: unable d/t weakness and aphasia but able to sign name only with assist from Lt hand  MOBILITY STATUS: using walker but often lifts it and does not use correctly   FUNCTIONAL OUTCOME MEASURES: TBA  UPPER EXTREMITY ROM:  RUE AROM WNLs LUE more limited at shoulder but distally WFLs   Active ROM Right eval Left eval  Shoulder flexion  90*  Shoulder abduction    Shoulder adduction    Shoulder extension    Shoulder internal rotation  WFL's  Shoulder external rotation  Approx 50%  Elbow flexion    Elbow extension    Wrist flexion    Wrist extension  Wrist ulnar deviation    Wrist radial deviation    Wrist pronation    Wrist supination    (Blank rows = not tested)    HAND FUNCTION: Grip strength: Right: 60.4 lbs; Left: 41.8 lbs 02/08/24: Lt = 35 lbs  COORDINATION: Pt can pick up pen Lt hand but cannot manipulate (? Apraxia as well as decreased coordination) - pt has had decline in coordination since recent new stroke Box & Blocks: Lt = 9  SENSATION: Appears WFL's - unable to localize stimulation but ? If this is due to aphasia  EDEMA: none  MUSCLE TONE: LUE: Mild and Hypertonic  COGNITION: Overall cognitive status: Impaired and significantly impaired expressive aphasia, and moderate receptive aphasia. Pt easily frustrated trying to express himself. Pt also  impulsive  VISION: Subjective report: per wife: vision has decreased more, but impaired prior to first stroke Baseline vision: Wears glasses for reading only Visual history: changes in vision prior to CVA with necrosis from radiation  VISION ASSESSMENT: Impaired Visual Fields: Left homonymous hemianopsia  Decreased general awareness and attention also - especially to Lt side  PERCEPTION: Impaired: Inattention/neglect: does not attend to left visual field and does not attend to left side of body  PRAXIS: Impaired: Ideomotor  OBSERVATIONS: Lt side improving from first stroke, then recent stroke made it worse. Pt becomes very agitated/frustrated when he is unable to communicate what he wants to say and gets mad at spouse when she tries to speak (for clarification).                                                                                                                              TREATMENT DATE: 02/17/24  Discussed importance of continued therapy to improve, however pt points to arm and says as good as it gets. Pt reluctantly agreed to continue through scheduled appts  Pt tried tying shoelace with max difficulty and min assist required. Pt unable to get lace sufficiently tight and shoelace too short. Pt shown different options for tying shoes including: elastic shoe laces, spyrolaces, and shoe buttons - provided handout and told where/how to purchase.  Pt practiced use of shoe button with mod cueing w/ no physical assistance after set up.  Practiced LUE reaching and coordination tasks:  - Seated at table - placing rubber washers on high prongs for repetitive forced use of LUE, coordination, and functional mid level reaching - Standing - practiced retrieving cones from mid level shelf LUE w/ max difficulty and mod drops, then replacing on shelf w/ min difficulty  Pt admits to not practicing HEP's at home. Therapist encouraged him to do these and other functional, SAFE, tasks at  home with LUE (NO sharp, heavy, breakable, hot) like using LUE to open drawers, cabinets, flip light switch on/off, putting light plastic dishes away, etc.   PATIENT EDUCATION: Education details: visual scanning strategies and activities, coordination HEP  Person educated: Patient and Spouse Education method: Explanation, Demonstration, Tactile  cues, Verbal cues, and Handouts Education comprehension: wife verbalized understanding, returned demonstration, verbal cues required, and needs further education  HOME EXERCISE PROGRAM: 01/27/24: visual scanning strategies and activities, coordination HEP    GOALS: Goals reviewed with patient? Yes   SHORT TERM GOALS: Target date: 02/20/24  Pt to be independent donning/doffing Lt shoe and both socks Baseline: Goal status: IN PROGRESS - pt able to do with shoe buttons  2.  Pt will be able to demo management of anger when he gets frustrated Baseline:  Goal status: DEFERRED - pt is doing much better and no longer needed.  3.  Pt/family to verbalize understanding of visual scanning strategies and pt able to recall at least 2 compensatory strategies for visual impairment without cueing Baseline:  Goal status: IN PROGRESS  4.  Pt independent with HEP for LT shoulder ROM and Lt hand coordination Baseline:  Goal status: IN PROGRESS  5.  Pt will improve LUE function as evidenced by performing 15 blocks on Box & Blocks test Baseline: 9 blocks Goal status: INITIAL   LONG TERM GOALS: Target date: 03/22/24  Pt will be independent with updated HEP  Baseline:  Goal status: INITIAL  2.  Patient will return demonstration of visual adaptation use and verbalize understanding of how to obtain item(s) if desired.  Baseline:  Goal status: INITIAL  3.  Patient will demo improved FM coordination as evidenced by completing nine-hole peg with LUE use and assist from RUE prn in 60 sec or under Baseline: unable Lt hand Goal status: INITIAL  4.  Pt with  improve LUE function as evidenced by performing 20 or more blocks on Box & Blocks Baseline: 9 blocks Goal status: INITIAL  5.  Pt to write name Lt hand with 90% legibility Baseline:  Goal status: INITIAL  6.  Pt will perform environmental scanning safely with min cues for 75% accuracy Baseline:  Goal status: INITIAL  ASSESSMENT:  CLINICAL IMPRESSION: Patient with poor carryover at home and the insight to acknowledge importance of performing HEP's and recommendations at home. Slowly progressing towards goals but limited by deficits including: decreased attention, awareness/insight, and lack of motivation to improve.   PERFORMANCE DEFICITS: in functional skills including ADLs, IADLs, coordination, proprioception, tone, ROM, strength, Fine motor control, Gross motor control, mobility, balance, body mechanics, endurance, decreased knowledge of precautions, decreased knowledge of use of DME, vision, and UE functional use, cognitive skills including attention, perception, and safety awareness, and psychosocial skills including environmental adaptation.   IMPAIRMENTS: are limiting patient from ADLs, IADLs, rest and sleep, leisure, and social participation.   CO-MORBIDITIES: has co-morbidities such as oligodendroglioma, seizures that affects occupational performance. Patient will benefit from skilled OT to address above impairments and improve overall function.  MODIFICATION OR ASSISTANCE TO COMPLETE EVALUATION: Min-Moderate modification of tasks or assist with assess necessary to complete an evaluation.  OT OCCUPATIONAL PROFILE AND HISTORY: Detailed assessment: Review of records and additional review of physical, cognitive, psychosocial history related to current functional performance.  CLINICAL DECISION MAKING: Moderate - several treatment options, min-mod task modification necessary  REHAB POTENTIAL: Fair decreased insight/awareness into deficits, decreased attention to Lt side,  aphasia  EVALUATION COMPLEXITY: Moderate    PLAN:  OT FREQUENCY: 2x/week  OT DURATION: 8 weeks  PLANNED INTERVENTIONS: 97535 self care/ADL training, 02889 therapeutic exercise, 97530 therapeutic activity, 97112 neuromuscular re-education, 97140 manual therapy, 97010 moist heat, 97129 Cognitive training (first 15 min), 02869 Cognitive training(each additional 15 min), 02239 Orthotic Initial, 97763 Orthotic/Prosthetic subsequent, passive range  of motion, balance training, functional mobility training, visual/perceptual remediation/compensation, energy conservation, coping strategies training, patient/family education, and DME and/or AE instructions  RECOMMENDED OTHER SERVICES: Pt scheduled for upcoming P.T. and SLP evaluations.   CONSULTED AND AGREED WITH PLAN OF CARE: Patient and family member/caregiver  PLAN FOR NEXT SESSION: visual scanning and coordination Lt side   Burnard Alan Roads, OT 02/17/2024, 9:34 AM

## 2024-02-19 ENCOUNTER — Other Ambulatory Visit: Payer: Self-pay | Admitting: Licensed Clinical Social Worker

## 2024-02-19 NOTE — Patient Instructions (Signed)
 Visit Information  Thank you for taking time to visit with me today. Please don't hesitate to contact me if I can be of assistance to you before our next scheduled appointment.  Our next appointment is by telephone on 03/04/24 at 1 pm Please call the care guide team at [phone] if you need to cancel or reschedule your appointment.   Following is a copy of your care plan:   Goals Addressed             This Visit's Progress    VBCI Social Work Care Plan       Current Barriers:  Medication management Find medication regime to manage behavioral health side effects and seizure activity (new medicine working) Provider appointments with Oncology and PCP for general follow-up Transportation -pt is  unable to drive x 90 days due to seizure activity Care Coordination needs related to Transportation and Cognitive Deficits Transportation barriers Lack of support within the home  LCSW Clinical Goal(s):  Patient/family will work with the Care Management team over the next 30 days to address Barriers: Support at home, increase safety in the home and gain stable Transportation take all medications exactly as prescribed and will call provider for medication related questions as evidenced by no missed medication doses , no uncontrolled side effects  attend all scheduled medical appointments: with Oncology as evidenced by no missed appointments , reschedule appointments if needed  continue to work with VBCI team to address care management and care coordination needs related to  transportation  as evidenced by adherence to CM Team Scheduled appointments work with Child psychotherapist to address  related to the Financial planner and Cognitive Deficits related to the management of brain cancer  as evidenced by review of EMR and patient or Child psychotherapist report through collaboration with providers and care team for management of  oligodendroglioma (low grade(3)), radiation necrosis of brain following  treatment of afore mentioned tumor   Interventions: Evaluation of current treatment plan related to  self management and patient's adherence to plan as established by provider [employer] Referral Made to address Transportation Evaluation of current treatment plan related to Seizure activity , Transportation and Cognitive Deficits self-management and patient's adherence to plan as established by provider. Past APS report filed due to safety concerns due to medication side effects. However, family report that patient is doing a lot better with new medication and it has helped to balance out her mood.  Past Oncology SW referral placed. Discussed plans with patient for ongoing care management follow up and provided patient with direct contact information for care management team Reviewed scheduled/upcoming provider appointments including PP and Oncology VBCI BSW Social Work referral for [employer] needs  VBCI LCSW warm transfer completed successfully  Discussed plans with patient for ongoing care management follow up and provided patient with direct contact information for care management team  Patient Goals/Self-Care Activities: Participate in scheduled calls Take all medications as prescribed Attend all scheduled provider appointments Call pharmacy for medication refills 3-7 days in advance of running out of medications Perform all self care activities independently  Call provider office for new concerns or questions  Work with the social worker to address care coordination needs and will continue to work with the clinical team to address health care and disease management related needs  Follow Up Plan:  The patient has been provided with contact information for the care management team and has been advised to call with any health related questions or concerns.  Please call the Suicide and Crisis Lifeline: 988 call the [place]  National Suicide Prevention Lifeline:  (440)433-4719 or TTY: 940-744-1851 TTY (785)201-6235) to talk to a trained counselor go to [facility] [address] [place] [phone]) call 911 if you are experiencing a Mental Health or Behavioral Health Crisis or need someone to talk to.  Patient verbalizes understanding of instructions and care plan provided today and agrees to view in MyChart. Active MyChart status and patient understanding of how to access instructions and care plan via MyChart confirmed with patient.     Lyle Rung, BSW, MSW, LCSW Licensed Clinical Social Worker [employer]   [facility] [email or web address] Direct Dial: [phone]

## 2024-02-19 NOTE — Patient Outreach (Addendum)
 Complex Care Management   Visit Note  02/19/2024  Name:  Alan Wise MRN: [id number] DOB: 1971/06/15  Situation: Referral received for Complex Care Management related to SDOH Barriers:  Transportation Caregiver strain. I obtained verbal consent from POA/spouse.  Visit completed with POA  on the phone  Background:   Past Medical History:  Diagnosis Date   Allergy    Complication of anesthesia    pt reports he is starting to have more difficulty arousing after surgery   Diabetes mellitus (HCC)    GERD (gastroesophageal reflux disease)    Headache    History of kidney stones    Hyperlipidemia    Renal disorder    kidney stones    Assessment: Patient Reported Symptoms:  Cognitive Cognitive Status: No symptoms reported, Able to follow simple commands Cognitive/Intellectual Conditions Management [RPT]: Other Other: Brain injury from stroke, cognitive deficits and visual deficits   Health Maintenance Behaviors: Annual physical exam Healing Pattern: Slow Health Facilitated by: Rest  Neurological Neurological Review of Symptoms: Weakness Neurological Management Strategies: Medication therapy, Routine screening, Coping strategies Neurological Self-Management Outcome: 4 (good) Neurological Comment: Seizure disorder (cannot drive)  HEENT HEENT Symptoms Reported: No symptoms reported      Cardiovascular Cardiovascular Symptoms Reported: Not assessed    Respiratory Respiratory Symptoms Reported: No symptoms reported    Endocrine Endocrine Symptoms Reported: Headaches, Weakness or fatigue Is patient diabetic?: Yes Is patient checking blood sugars at home?: No Endocrine Self-Management Outcome: 3 (uncertain)  Gastrointestinal Gastrointestinal Symptoms Reported: Not assessed      Genitourinary Genitourinary Symptoms Reported: Not assessed    Integumentary Integumentary Symptoms Reported: No symptoms reported    Musculoskeletal Musculoskelatal Symptoms Reviewed: Unsteady  gait, Weakness Musculoskeletal Management Strategies: Adequate rest, Routine screening Musculoskeletal Self-Management Outcome: 3 (uncertain) Falls in the past year?: Yes Number of falls in past year: 1 or less Was there an injury with Fall?: No Fall Risk Category Calculator: 1 Patient Fall Risk Level: Low Fall Risk    Psychosocial Psychosocial Symptoms Reported: Irritability Behavioral Management Strategies: Adequate rest, Medication therapy Behavioral Health Self-Management Outcome: 4 (good) Major Change/Loss/Stressor/Fears (CP): Medical condition, self Techniques to Cope with Loss/Stress/Change: Medication Quality of Family Relationships: involved, helpful Do you feel physically threatened by others?: No   Medications Reviewed Today     Reviewed by Merlynn Lyle CROME, LCSW (Social Worker) on 02/19/24 at 1024  Med List Status: <None>   Medication Order Taking? Sig Documenting Provider Last Dose Status Informant  acetaminophen  (TYLENOL ) 325 MG tablet [id number]  Take 1-2 tablets (325-650 mg total) by mouth every 4 (four) hours as needed for mild pain (pain score 1-3). [name], PA-C  Active   amLODipine  (NORVASC ) 5 MG tablet [id number]  TAKE 1 TABLET (5 MG TOTAL) BY MOUTH DAILY. Vaslow, Zachary K, MD  Active   apixaban  (ELIQUIS ) 5 MG TABS tablet [id number]  Take 1 tablet (5 mg total) by mouth 2 (two) times daily. [name], PA-C  Active   atorvastatin  (LIPITOR) 40 MG tablet [id number]  Take 1 tablet (40 mg total) by mouth daily with supper. [name], PA-C  Active   feeding supplement (ENSURE PLUS HIGH PROTEIN) LIQD [id number]  Take 237 mLs by mouth 3 (three) times daily between meals. [name], PA-C  Active   lamoTRIgine  (LAMICTAL ) 150 MG tablet [id number]  Take 1 tablet (150 mg total) by mouth 2 (two) times daily. [name], PA-C  Active   Multiple Vitamin (MULTIVITAMIN WITH MINERALS) TABS tablet  [id number]  Take 1 tablet by mouth daily. [name], PA-C  Active    ondansetron  (ZOFRAN -ODT) 8 MG disintegrating tablet [id number]  Take 1 tablet (8 mg total) by mouth every 8 (eight) hours. [name], PA-C  Active   polyethylene glycol powder (GLYCOLAX /MIRALAX ) 17 GM/SCOOP powder [id number]  Take 1 capful (17 g) by mouth daily. [name], PA-C  Active   prochlorperazine  (COMPAZINE ) 5 MG tablet [id number]  Take 1 tablet (5 mg total) by mouth every 8 (eight) hours as needed for refractory nausea / vomiting. [name], PA-C  Active   propranolol  (INDERAL ) 20 MG tablet [id number]  Take 1 tablet (20 mg total) by mouth 2 (two) times daily. [name], PA-C  Active   QUEtiapine  (SEROQUEL ) 25 MG tablet [id number]  Take 1 tablet (25 mg total) by mouth 2 (two) times daily. Vaslow, Zachary K, MD  Active   senna-docusate (SENOKOT-S) 8.6-50 MG tablet [id number]  Take 2 tablets by mouth daily with supper. [name], PA-C  Active             Recommendation:   Acute PCP follow-up Oncology . Continue Current Plan of Care  Follow Up Plan:   Telephone follow-up in 1 month  Lyle Rung, BSW, MSW, LCSW Licensed Clinical Social Worker [employer]   [email or web address] [email or web address] Direct Dial: [phone]

## 2024-02-23 ENCOUNTER — Ambulatory Visit: Attending: Physical Medicine and Rehabilitation | Admitting: Occupational Therapy

## 2024-02-23 ENCOUNTER — Encounter: Payer: Self-pay | Admitting: Occupational Therapy

## 2024-02-23 ENCOUNTER — Ambulatory Visit

## 2024-02-23 DIAGNOSIS — R2689 Other abnormalities of gait and mobility: Secondary | ICD-10-CM | POA: Diagnosis present

## 2024-02-23 DIAGNOSIS — R2681 Unsteadiness on feet: Secondary | ICD-10-CM | POA: Diagnosis present

## 2024-02-23 DIAGNOSIS — R41840 Attention and concentration deficit: Secondary | ICD-10-CM | POA: Insufficient documentation

## 2024-02-23 DIAGNOSIS — I6300 Cerebral infarction due to thrombosis of unspecified precerebral artery: Secondary | ICD-10-CM | POA: Insufficient documentation

## 2024-02-23 DIAGNOSIS — R41842 Visuospatial deficit: Secondary | ICD-10-CM | POA: Diagnosis present

## 2024-02-23 DIAGNOSIS — R41841 Cognitive communication deficit: Secondary | ICD-10-CM | POA: Diagnosis present

## 2024-02-23 DIAGNOSIS — R278 Other lack of coordination: Secondary | ICD-10-CM | POA: Insufficient documentation

## 2024-02-23 DIAGNOSIS — M6281 Muscle weakness (generalized): Secondary | ICD-10-CM

## 2024-02-23 DIAGNOSIS — R4701 Aphasia: Secondary | ICD-10-CM | POA: Insufficient documentation

## 2024-02-23 NOTE — Therapy (Signed)
 OUTPATIENT PHYSICAL THERAPY NEURO TREATMENT NOTE   Patient Name: Alan Wise MRN: [id number] DOB:1971-02-12, 53 y.o., male Today's Date: 02/23/2024  PHYSICAL THERAPY DISCHARGE SUMMARY  Visits from Start of Care: 5  Current functional level related to goals / functional outcomes:    Remaining deficits: none   Education / Equipment: HEP   Patient agrees to discharge. Patient goals were met. Patient is being discharged due to maximized rehab potential.   PCP: Angeline Laura, NP REFERRING PROVIDER: Toribio Pitch, PA-C  END OF SESSION:  PT End of Session - 02/23/24 0942     Visit Number 5    Number of Visits 5    Date for PT Re-Evaluation 02/22/24    Authorization Type [employer]    PT Start Time [phone]    PT Stop Time 1015    PT Time Calculation (min) 40 min    Equipment Utilized During Treatment Gait belt    Activity Tolerance Patient tolerated treatment well    Behavior During Therapy WFL for tasks assessed/performed           Past Medical History:  Diagnosis Date   Allergy    Complication of anesthesia    pt reports he is starting to have more difficulty arousing after surgery   Diabetes mellitus (HCC)    GERD (gastroesophageal reflux disease)    Headache    History of kidney stones    Hyperlipidemia    Renal disorder    kidney stones   Past Surgical History:  Procedure Laterality Date   APPLICATION OF CRANIAL NAVIGATION Right 01/17/2021   Procedure: APPLICATION OF CRANIAL NAVIGATION;  Surgeon: Debby Dorn MATSU, MD;  Location: [facility] OR;  Service: Neurosurgery;  Laterality: Right;   COLONOSCOPY  09/03/1994   COLONOSCOPY WITH PROPOFOL  N/A 01/07/2023   Procedure: COLONOSCOPY WITH PROPOFOL ;  Surgeon: Jinny Carmine, MD;  Location: [facility] ENDOSCOPY;  Service: Endoscopy;  Laterality: N/A;  NOT TOO EARLY   CYSTOSCOPY WITH STENT PLACEMENT Right 01/25/2016   Procedure: CYSTOSCOPY WITH STENT PLACEMENT;  Surgeon: Redell Lynwood Napoleon, MD;  Location: [facility] ORS;   Service: Urology;  Laterality: Right;   FRAMELESS  BIOPSY WITH BRAINLAB Right 01/17/2021   Procedure: RIGHT STEREOTACTIC BIOPSY OF INSULAR LESION;  Surgeon: Debby Dorn MATSU, MD;  Location[facility] OR;  Service: Neurosurgery;  Laterality: Right;   KNEE ARTHROSCOPY W/ MENISCAL REPAIR Right 06/23/1988   POLYPECTOMY  01/07/2023   Procedure: POLYPECTOMY;  Surgeon: Jinny Carmine, MD;  Location: [facility] ENDOSCOPY;  Service: Endoscopy;;   removal of birthmark  06/08/2013   SKIN CANCER EXCISION  06/23/2012   TYMPANOSTOMY TUBE PLACEMENT  08/06/1978   URETEROSCOPY WITH HOLMIUM LASER LITHOTRIPSY Right 01/25/2016   Procedure: URETEROSCOPY WITH HOLMIUM LASER LITHOTRIPSY;  Surgeon: Redell Lynwood Napoleon, MD;  Location: [facility] ORS;  Service: Urology;  Laterality: Right;   URETHRAL STRICTURE DILATATION     visual inspection of vocal cord     1973   WISDOM TOOTH EXTRACTION     Patient Active Problem List   Diagnosis Date Noted   Nausea and vomiting 01/07/2024   Malnutrition of moderate degree 01/01/2024   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) 12/30/2023   Acute right ACA stroke (HCC) 12/24/2023   Acute CVA (cerebrovascular accident) (HCC) 12/18/2023   Psychomotor agitation 12/11/2023   Sepsis due to gram-negative UTI (HCC) 12/09/2023   Abnormal brain MRI 12/09/2023   Dyslipidemia 12/09/2023   Dysphagia 09/14/2023   Gait disorder 09/14/2023   Seizure  disorder (HCC) 09/10/2023   History of CVA (cerebrovascular accident) 09/09/2023   Essential hypertension 09/09/2023   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   Type 2 diabetes mellitus with hyperlipidemia (HCC) 06/12/2014   Hyperlipidemia associated with type 2 diabetes mellitus (HCC) 06/12/2014    ONSET DATE: 10/18/23  REFERRING DIAG: acute right ACA stroke  THERAPY DIAG:  Unsteadiness on feet  Muscle weakness (generalized)  Other abnormalities of gait  and mobility  Cerebrovascular accident (CVA) due to thrombosis of precerebral artery (HCC)  Rationale for Evaluation and Treatment: Rehabilitation  SUBJECTIVE:                                                                                                                                                                                             SUBJECTIVE STATEMENT: Pt reports of no falls at home. When patient's wife was called at end of the session to provide update, she reported that he had a fall last week at home where he slid off the bed again. Pt had hard time getting up but she was able to get the stool closer to him and he was able to get up by himself and she didn't have to help him which helped her back out as well.  Pt accompanied by: self and wife  PERTINENT HISTORY: PMHx: T2DM, GERD, CVA w/ L sided weakness, oligodendroglioma  PAIN:  Are you having pain? No  PRECAUTIONS: Fall  RED FLAGS: None   WEIGHT BEARING RESTRICTIONS: No  FALLS: Has patient fallen in last 6 months? Yes. Number of falls 1  LIVING ENVIRONMENT: Lives with: lives with their spouse Lives in: House/apartment Stairs: Yes: Internal: 13 steps Has following equipment at home: Walker - 2 wheeled and None  PLOF: Needs assistance with homemaking  PATIENT GOALS: improve balance  OBJECTIVE:  Note: Objective measures were completed at Evaluation unless otherwise noted.  DIAGNOSTIC FINDINGS: 01/15/24 IMPRESSION: 1. Interval evolution of the nonhemorrhagic infarcts involving the right single lid gyrus, right basal ganglia and periventricular white matter and left lentiform nucleus. 2. Stable appearance of right frontal lobe previously treated neoplasm, with hemosiderin staining from prior hemorrhage and surrounding gliosis. No adverse interval change.  COGNITION: Overall cognitive status: Difficulty to assess due to: Communication impairment    LOWER EXTREMITY ROM:     Active  Right Eval  Left Eval  Hip flexion    Hip extension    Hip abduction    Hip adduction    Hip internal rotation    Hip external rotation    Knee flexion    Knee extension  Ankle dorsiflexion    Ankle plantarflexion    Ankle inversion    Ankle eversion     (Blank rows = not tested)  LOWER EXTREMITY MMT:    MMT Right Eval Left Eval  Hip flexion    Hip extension    Hip abduction    Hip adduction    Hip internal rotation    Hip external rotation    Knee flexion    Knee extension    Ankle dorsiflexion 5/5 5/5  Ankle plantarflexion    Ankle inversion    Ankle eversion    (Blank rows = not tested) GAIT: Findings: Gait Characteristics: poor foot clearance- Right and poor foot clearance- Left, Distance walked: 230', and Comments: Pt has occasional steppage gait which can occur left or right foot and is not due to foot drop. According to patient he has always done that since he was young (before his 2 strokes and chemo/radiation treatments). Pt denies falls from this steppage gait.  FUNCTIONAL TESTS:  5 times sit to stand: 11.20 sec (01/25/24); 9 sec (02/23/24) 10 meter walk test: 1.1 m/s without AD (01/25/24); 0.85 m/s (02/23/24) without AD  FUNCTIONAL GAIT ASSESSMENT  Date: 02/23/24 Score  GAIT LEVEL SURFACE Instructions: Walk at your normal speed from here to the next mark (6 m) [20 ft]. (3) Normal - Walks 6 m (20 ft) in less than 5.5 seconds, no assistive devices, good speed, no evidence for imbalance, normal gait pattern, deviates no more than 15.24 cm (6 in) outside of the 30.48-cm (12-in) walkway width.  2.   CHANGE IN GAIT SPEED Instructions: Begin walking at your normal pace (for 1.5 m [5 ft]). When I tell you "go," walk as fast as you can (for 1.5 m [5 ft]). When I tell you "slow," walk as slowly as you can (for 1.5 m [5 ft]. (3) Normal - Able to smoothly change walking speed without loss of balance or gait deviation. Shows a significant difference in walking speeds between normal, fast, and  slow speeds. Deviates no more than 15.24 cm (6 in) outside of the 30.48-cm (12-in) walkway width.  3.    GAIT WITH HORIZONTAL HEAD TURNS Instructions: Walk from here to the next mark 6 m (20 ft) away. Begin walking at your normal pace. Keep walking straight; after 3 steps, turn your head to the right and keep walking straight while looking to the right. After 3 more steps, turn your head to the left and keep walking straight while looking left. Continue alternating looking right and left. (3) Normal - Performs head turns smoothly with no change in gait. Deviates no more than 15.24 cm (6 in) outside 30.48-cm (12-in) walkway width.  4.   GAIT WITH VERTICAL HEAD TURNS Instructions: Walk from here to the next mark (6 m [20 ft]). Begin walking at your normal pace. Keep walking straight; after 3 steps, tip your head up and keep walking straight while looking up. After 3 more steps, tip your head down, keep walking straight while looking down. Continue  alternating looking up and down every 3 steps until you have completed 2 repetitions in each direction. (3) Normal - Performs head turns with no change in gait. Deviates no more than 15.24 cm (6 in) outside 30.48-cm (12-in) walkway width.  5.  GAIT AND PIVOT TURN Instructions: Begin with walking at your normal pace. When I tell you, "turn and stop," turn as quickly as you can to face the opposite direction and stop. (3) Normal -  Pivot turns safely within 3 seconds and stops quickly with no loss of balance  6.   STEP OVER OBSTACLE Instructions: Begin walking at your normal speed. When you come to the shoe box, step over it, not around it, and keep walking. (3) Normal - Is able to step over 2 stacked shoe boxes taped together (22.86 cm [9 in] total height) without changing gait speed; no evidence of imbalance.  7.   GAIT WITH NARROW BASE OF SUPPORT Instructions: Walk on the floor with arms folded across the chest, feet aligned heel to toe in tandem for a distance of  3.6 m [12 ft]. The number of steps taken in a straight line are counted for a maximum of 10 steps. (2) Mild impairment - Ambulates 7-9 steps  8.   GAIT WITH EYES CLOSED Instructions: Walk at your normal speed from here to the next mark (6 m [20 ft]) with your eyes closed. (2) Mild impairment - Walks 6 m (20 ft), uses assistive device, slower speed, mild gait deviations, deviates 15.24 -25.4 cm (6 -10 in) outside 30.48-cm (12-in) walkway width. Ambulates 6 m (20 ft) in less than 9 seconds but greater than 7 seconds  9.   AMBULATING BACKWARDS Instructions: Walk backwards until I tell you to stop (3) Normal - Walks 6 m (20 ft), no assistive devices, good speed, no evidence for imbalance, normal gait pattern, deviates no more than 15.24 cm (6 in) outside 30.48-cm (12-in) walkway width.  10. STEPS Instructions: Walk up these stairs as you would at home (ie, using the rail if necessary). At the top turn around and walk down. (2) Mild impairment-Alternating feet, must use rail.  Total 27/30   Interpretation of scores: Non-Specific Older Adults Cutoff Score: <=22/30 = risk of falls Parkinson's Disease Cutoff score <15/30= fall risk (Hoehn & Yahr 1-4)  Minimally Clinically Important Difference (MCID)  Stroke (acute, subacute, and chronic) = MDC: 4.2 points Vestibular (acute) = MDC: 6 points Community Dwelling Older Adults =  MCID: 4 points Parkinson's Disease  =  MDC: 4.3 points  (Academy of Neurologic Physical Therapy (nd). Functional Gait Assessment. Retrieved from https://www.neuropt.org/docs/default-source/cpgs/core-outcome-measures/function-gait-assessment-pocket-guide-proof9-(2).pdf?sfvrsn=b22f35043_0.)                                                                                                                              TREATMENT DATE:   02/23/24  Reassessment performed today  Gait training: 1 x 460' with black sports cord. First 66' with constant resistance and 2nd 70' with intermittent  resistance. Noted patient striking medial left heel to posterior R heel during L swing phase. This occurred 4-5 times during entire walk. This was the main reason why paient has trip without actually falling.  Performed floor transfer: all the way to supine and back up. Performed 3 trials, 1st trial with min cueing, 2nd and 3rd trial with no cueing.  Called wife at end of the session to provide update and go over goals.  Wife reported that she placed a throw rug under his bed to provide some traction for him which seems to help him. PT educated that safer option will be hospital socks with grips on them. Wife was offered a sock to try at night but she reported he won't wear it (actually patient in the room was also expressing that he won't wear it). Wife also educated that reducing the height of the bed by 2-3 may also help him not slide off the bed.   Discussed discharge planning from PT with wife (on the phone) and patient. Both verbally agreed with discharge from PT.   PATIENT EDUCATION: Education details: see above Person educated: Patient Education method: Explanation Education comprehension: verbalized understanding  HOME EXERCISE PROGRAM: TBD  GOALS: Goals reviewed with patient? Yes SHORT TERM GOALS: no STG as POC<4 weeks.   LONG TERM GOALS: Target date: 02/22/2024    Patient will demo 5x sit to stand score of <10 sec to improve overall functional strength. Baseline: 34 s without UE support; 19 seconds (11/18/23); 10.25 seconds (11/30/23); 11.19 sec (01/25/24); 9 sec (02/23/24) Goal status: Goal met    2.  Pt will demo functional gait assessment score of >27/30 to improve overall balance with functional gait. Baseline: 20/30 (eval); 21/30 (11/18/23); 24/30 (01/25/24); 27/30 (02/23/24) Goal status: Progressing continue  ASSESSMENT:  CLINICAL IMPRESSION: Patient has been seen for total of 5 sessions for gait and mobility impairments after recent stroke. Patient has progressed well  and met all of his short term and long term goals. Patient has maximized rehab potential for physical therapy at this time and will be discharged from skilled PT at this time.   OBJECTIVE IMPAIRMENTS: Abnormal gait, decreased balance, decreased knowledge of condition, and difficulty walking.   ACTIVITY LIMITATIONS: carrying, lifting, squatting, stairs, and transfers  PARTICIPATION LIMITATIONS: meal prep, cleaning, shopping, community activity, and yard work  PERSONAL FACTORS: Time since onset of injury/illness/exacerbation are also affecting patient's functional outcome.   REHAB POTENTIAL: Excellent  CLINICAL DECISION MAKING: Stable/uncomplicated  EVALUATION COMPLEXITY: Low  PLAN: discharge from skilled PT   Raj LOISE Blanch, PT 02/23/2024, 11:41 AM

## 2024-02-23 NOTE — Therapy (Signed)
 OUTPATIENT OCCUPATIONAL THERAPY NEURO TREATMENT  Patient Name: Alan Wise MRN: [id number] DOB:Sep 04, 1970, 53 y.o., male Today's Date: 02/23/2024  PCP: Antonette Angeline ORN, NP  REFERRING PROVIDER: Pegge Toribio PARAS, PA-C  END OF SESSION:  OT End of Session - 02/23/24 0848     Visit Number 7    Number of Visits 16    Date for OT Re-Evaluation 03/22/24    Authorization Type [employer] 2025 OOPM MET    Authorization Time Period Covered 100% VL:MN Auth Not Reqd    OT Start Time [phone]    OT Stop Time 0930    OT Time Calculation (min) 44 min    Activity Tolerance Patient tolerated treatment well    Behavior During Therapy Agitated;Impulsive          Past Medical History:  Diagnosis Date   Allergy    Complication of anesthesia    pt reports he is starting to have more difficulty arousing after surgery   Diabetes mellitus (HCC)    GERD (gastroesophageal reflux disease)    Headache    History of kidney stones    Hyperlipidemia    Renal disorder    kidney stones   Past Surgical History:  Procedure Laterality Date   APPLICATION OF CRANIAL NAVIGATION Right 01/17/2021   Procedure: APPLICATION OF CRANIAL NAVIGATION;  Surgeon: Debby Dorn MATSU, MD;  Location: [facility] OR;  Service: Neurosurgery;  Laterality: Right;   COLONOSCOPY  09/03/1994   COLONOSCOPY WITH PROPOFOL  N/A 01/07/2023   Procedure: COLONOSCOPY WITH PROPOFOL ;  Surgeon: Jinny Carmine, MD;  Location: [facility] ENDOSCOPY;  Service: Endoscopy;  Laterality: N/A;  NOT TOO EARLY   CYSTOSCOPY WITH STENT PLACEMENT Right 01/25/2016   Procedure: CYSTOSCOPY WITH STENT PLACEMENT;  Surgeon: Redell Lynwood Napoleon, MD;  Location: [facility] ORS;  Service: Urology;  Laterality: Right;   FRAMELESS  BIOPSY WITH BRAINLAB Right 01/17/2021   Procedure: RIGHT STEREOTACTIC BIOPSY OF INSULAR LESION;  Surgeon: Debby Dorn MATSU, MD;  Location: [facility] OR;  Service: Neurosurgery;  Laterality: Right;   KNEE ARTHROSCOPY W/ MENISCAL REPAIR Right 06/23/1988    POLYPECTOMY  01/07/2023   Procedure: POLYPECTOMY;  Surgeon: Jinny Carmine, MD;  Location: [facility] ENDOSCOPY;  Service: Endoscopy;;   removal of birthmark  06/08/2013   SKIN CANCER EXCISION  06/23/2012   TYMPANOSTOMY TUBE PLACEMENT  08/06/1978   URETEROSCOPY WITH HOLMIUM LASER LITHOTRIPSY Right 01/25/2016   Procedure: URETEROSCOPY WITH HOLMIUM LASER LITHOTRIPSY;  Surgeon: Redell Lynwood Napoleon, MD;  Location: [facility] ORS;  Service: Urology;  Laterality: Right;   URETHRAL STRICTURE DILATATION     visual inspection of vocal cord     1973   WISDOM TOOTH EXTRACTION     Patient Active Problem List   Diagnosis Date Noted   Nausea and vomiting 01/07/2024   Malnutrition of moderate degree 01/01/2024   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) 12/30/2023   Acute right ACA stroke (HCC) 12/24/2023   Acute CVA (cerebrovascular accident) (HCC) 12/18/2023   Psychomotor agitation 12/11/2023   Sepsis due to gram-negative UTI (HCC) 12/09/2023   Abnormal brain MRI 12/09/2023   Dyslipidemia 12/09/2023   Dysphagia 09/14/2023   Gait disorder 09/14/2023   Seizure disorder (HCC) 09/10/2023   History of CVA (cerebrovascular accident) 09/09/2023   Essential hypertension 09/09/2023   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   Pt aphasic - verbal but unable to communicate what he wanted. Pt did have occasional automatic appropriate responses.  Wife reports he had first stroke  in March, possibly another smaller stroke, then this new stroke end of June with worsened symptoms. 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  Presented to the hospital 09/14/2023 with worsening gait left-sided weakness/aphasia and new onset dysphagia.  Hospitalized from 3-19 to 3-21 with CVA and breakthrough seizures.    PRECAUTIONS: Fall and Other: impulsive, decreased awareness, Lt visual field loss, seizures (recent one in beginning of July - 7th or 8th)   WEIGHT BEARING RESTRICTIONS: No  PAIN:  Are you having pain? No  FALLS: Has patient fallen in last 6 months? Yes. Number of falls 3  LIVING ENVIRONMENT: Lives with: lives with their family (wife, 70 y.o. dtr, 94 y.o. 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  HAND DOMINANCE: Left, but now using Rt hand  ADLs: Overall ADLs: Pt requires 24 hr supervison Transfers/ambulation related to ADLs: pt not using walker safety/correctly often lifting it Eating: mod I eating with Rt hand since March (first stroke), wife cuts food  Grooming: mod I with Rt hand, wife trimming beard and/or  shaving UB Dressing: independent LB Dressing: assist for Lt shoe and sock, and occasionally RT sock Toileting: independent Bathing: distant sup (wife reports she's not allowed in there - pt will not let her assist)  Tub Shower transfers: mod I  Equipment: none  IADLs: dep for all IADLS Community mobility: Has not driven since Nov 2024 d/t seizure, then more seizures, then first stroke Medication management: wife giving pt all medications Financial management: dep Handwriting: unable d/t weakness and aphasia but able to sign name only with assist from Lt hand  MOBILITY STATUS: using walker but often lifts it and does not use correctly   FUNCTIONAL OUTCOME MEASURES: TBA  UPPER EXTREMITY ROM:  RUE AROM WNLs LUE more limited at shoulder but distally WFLs   Active ROM Right eval Left eval  Shoulder flexion  90*  Shoulder abduction    Shoulder adduction    Shoulder extension    Shoulder internal rotation  WFL's  Shoulder external rotation  Approx 50%  Elbow flexion    Elbow extension    Wrist flexion    Wrist extension    Wrist ulnar deviation    Wrist radial deviation    Wrist pronation  Wrist supination    (Blank rows = not tested)    HAND FUNCTION: Grip strength: Right: 60.4 lbs; Left: 41.8 lbs 02/08/24: Lt = 35 lbs  COORDINATION: Pt can pick up pen Lt hand but cannot manipulate (? Apraxia as well as decreased coordination) - pt has had decline in coordination since recent new stroke Box & Blocks: Lt = 9  SENSATION: Appears WFL's - unable to localize stimulation but ? If this is due to aphasia  EDEMA: none  MUSCLE TONE: LUE: Mild and Hypertonic  COGNITION: Overall cognitive status: Impaired and significantly impaired expressive aphasia, and moderate receptive aphasia. Pt easily frustrated trying to express himself. Pt also impulsive  VISION: Subjective report: per wife: vision has decreased more, but impaired prior to first stroke Baseline vision: Wears  glasses for reading only Visual history: changes in vision prior to CVA with necrosis from radiation  VISION ASSESSMENT: Impaired Visual Fields: Left homonymous hemianopsia  Decreased general awareness and attention also - especially to Lt side  PERCEPTION: Impaired: Inattention/neglect: does not attend to left visual field and does not attend to left side of body  PRAXIS: Impaired: Ideomotor  OBSERVATIONS: Lt side improving from first stroke, then recent stroke made it worse. Pt becomes very agitated/frustrated when he is unable to communicate what he wants to say and gets mad at spouse when she tries to speak (for clarification).                                                                                                                              TREATMENT DATE: 02/23/24  Pt attempting to match digital with analogue times for visual scanning and cognition. Pt only matching through process of elimination as puzzle pcs fit (d/t aphasia). Pt unable to match based on times  Pt copying peg design for visual scanning and coordination Lt hand - w/ max difficulty using Lt hand for medium sized pegs. Pt using RT hand to assist prn (placing in Lt hand and/or angling correctly for pegboard placement)   Gripper set at level 2 resistance to pick up blocks Lt hand for sustained grip strength, attention to Lt side, and proprioceptive input. Pt with max difficulty as he often didn't grab a block and wasn't aware until going to place in bowl. Pt also at times dragged bowl across table as he did not clear hand of bowl when returning to starting position. Both of these difficulties were more d/t decreased attention than lack of grip strength. Unable to finish d/t extra time required and time constraints   PATIENT EDUCATION: Education details: visual scanning strategies and activities, coordination HEP  Person educated: Patient and Spouse Education method: Explanation, Demonstration, Tactile cues,  Verbal cues, and Handouts Education comprehension: wife verbalized understanding, returned demonstration, verbal cues required, and needs further education  HOME EXERCISE PROGRAM: 01/27/24: visual scanning strategies and activities, coordination HEP    GOALS: Goals reviewed with patient? Yes   SHORT TERM GOALS:  Target date: 02/20/24  Pt to be independent donning/doffing Lt shoe and both socks Baseline: Goal status: IN PROGRESS - pt able to do with shoe buttons  2.  Pt will be able to demo management of anger when he gets frustrated Baseline:  Goal status: DEFERRED - pt is doing much better and no longer needed.  3.  Pt/family to verbalize understanding of visual scanning strategies and pt able to recall at least 2 compensatory strategies for visual impairment without cueing Baseline:  Goal status: IN PROGRESS  4.  Pt independent with HEP for LT shoulder ROM and Lt hand coordination Baseline:  Goal status: IN PROGRESS  5.  Pt will improve LUE function as evidenced by performing 15 blocks on Box & Blocks test Baseline: 9 blocks Goal status: INITIAL   LONG TERM GOALS: Target date: 03/22/24  Pt will be independent with updated HEP  Baseline:  Goal status: INITIAL  2.  Patient will return demonstration of visual adaptation use and verbalize understanding of how to obtain item(s) if desired.  Baseline:  Goal status: INITIAL  3.  Patient will demo improved FM coordination as evidenced by completing nine-hole peg with LUE use and assist from RUE prn in 60 sec or under Baseline: unable Lt hand Goal status: INITIAL  4.  Pt with improve LUE function as evidenced by performing 20 or more blocks on Box & Blocks Baseline: 9 blocks Goal status: INITIAL  5.  Pt to write name Lt hand with 90% legibility Baseline:  Goal status: INITIAL  6.  Pt will perform environmental scanning safely with min cues for 75% accuracy Baseline:  Goal status: INITIAL  ASSESSMENT:  CLINICAL  IMPRESSION: Patient with poor carryover at home and lacks the insight to acknowledge importance of performing HEP's and recommendations at home. Slowly progressing towards goals but limited by deficits including: decreased attention, awareness/insight, and lack of motivation to improve.   PERFORMANCE DEFICITS: in functional skills including ADLs, IADLs, coordination, proprioception, tone, ROM, strength, Fine motor control, Gross motor control, mobility, balance, body mechanics, endurance, decreased knowledge of precautions, decreased knowledge of use of DME, vision, and UE functional use, cognitive skills including attention, perception, and safety awareness, and psychosocial skills including environmental adaptation.   IMPAIRMENTS: are limiting patient from ADLs, IADLs, rest and sleep, leisure, and social participation.   CO-MORBIDITIES: has co-morbidities such as oligodendroglioma, seizures that affects occupational performance. Patient will benefit from skilled OT to address above impairments and improve overall function.  MODIFICATION OR ASSISTANCE TO COMPLETE EVALUATION: Min-Moderate modification of tasks or assist with assess necessary to complete an evaluation.  OT OCCUPATIONAL PROFILE AND HISTORY: Detailed assessment: Review of records and additional review of physical, cognitive, psychosocial history related to current functional performance.  CLINICAL DECISION MAKING: Moderate - several treatment options, min-mod task modification necessary  REHAB POTENTIAL: Fair decreased insight/awareness into deficits, decreased attention to Lt side, aphasia  EVALUATION COMPLEXITY: Moderate    PLAN:  OT FREQUENCY: 2x/week  OT DURATION: 8 weeks  PLANNED INTERVENTIONS: 97535 self care/ADL training, 02889 therapeutic exercise, 97530 therapeutic activity, 97112 neuromuscular re-education, 97140 manual therapy, 97010 moist heat, 97129 Cognitive training (first 15 min), 02869 Cognitive training(each  additional 15 min), 02239 Orthotic Initial, 97763 Orthotic/Prosthetic subsequent, passive range of motion, balance training, functional mobility training, visual/perceptual remediation/compensation, energy conservation, coping strategies training, patient/family education, and DME and/or AE instructions  RECOMMENDED OTHER SERVICES: Pt scheduled for upcoming P.T. and SLP evaluations.   CONSULTED AND AGREED WITH PLAN OF CARE: Patient and  family member/caregiver  PLAN FOR NEXT SESSION: address/assess remaining STG's   Burnard JINNY Roads, OT 02/23/2024, 8:48 AM

## 2024-02-25 ENCOUNTER — Ambulatory Visit: Admitting: Occupational Therapy

## 2024-02-25 ENCOUNTER — Encounter: Payer: Self-pay | Admitting: Occupational Therapy

## 2024-02-25 DIAGNOSIS — R278 Other lack of coordination: Secondary | ICD-10-CM | POA: Diagnosis not present

## 2024-02-25 DIAGNOSIS — R41842 Visuospatial deficit: Secondary | ICD-10-CM

## 2024-02-25 DIAGNOSIS — R41840 Attention and concentration deficit: Secondary | ICD-10-CM

## 2024-02-25 NOTE — Therapy (Signed)
 OUTPATIENT OCCUPATIONAL THERAPY NEURO TREATMENT  Patient Name: Alan Wise MRN: [id number] DOB:[birth year], 53 y.o., male Today's Date: 02/25/2024  PCP: Antonette Angeline ORN, NP  REFERRING PROVIDER: Pegge Toribio PARAS, PA-C  END OF SESSION:  OT End of Session - 02/25/24 0838     Visit Number 8    Number of Visits 16    Date for OT Re-Evaluation 03/22/24    Authorization Type [employer] 2025 OOPM MET    Authorization Time Period Covered 100% VL:MN Auth Not Reqd    OT Start Time [phone]    OT Stop Time 0930    OT Time Calculation (min) 43 min    Activity Tolerance Patient tolerated treatment well    Behavior During Therapy Agitated;Impulsive          Past Medical History:  Diagnosis Date   Allergy    Complication of anesthesia    pt reports he is starting to have more difficulty arousing after surgery   Diabetes mellitus (HCC)    GERD (gastroesophageal reflux disease)    Headache    History of kidney stones    Hyperlipidemia    Renal disorder    kidney stones   Past Surgical History:  Procedure Laterality Date   APPLICATION OF CRANIAL NAVIGATION Right 01/17/2021   Procedure: APPLICATION OF CRANIAL NAVIGATION;  Surgeon: Debby Dorn MATSU, MD;  [facility];  Service: Neurosurgery;  Laterality: Right;   COLONOSCOPY  09/03/1994   COLONOSCOPY WITH PROPOFOL  N/A 01/07/2023   Procedure: COLONOSCOPY WITH PROPOFOL ;  Surgeon: Jinny Carmine, MD;  Location: [facility] ENDOSCOPY;  Service: Endoscopy;  Laterality: N/A;  NOT TOO EARLY   CYSTOSCOPY WITH STENT PLACEMENT Right 01/25/2016   Procedure: CYSTOSCOPY WITH STENT PLACEMENT;  Surgeon: Redell Lynwood Napoleon, MD;  Location: [facility] ORS;  Service: Urology;  Laterality: Right;   FRAMELESS  BIOPSY WITH BRAINLAB Right 01/17/2021   Procedure: RIGHT STEREOTACTIC BIOPSY OF INSULAR LESION;  Surgeon: Debby Dorn MATSU, MD;  Location: [facility] OR;  Service: Neurosurgery;  Laterality: Right;   KNEE ARTHROSCOPY W/ MENISCAL REPAIR Right 06/23/1988    POLYPECTOMY  01/07/2023   Procedure: POLYPECTOMY;  Surgeon: Jinny Carmine, MD;  Location: [facility] ENDOSCOPY;  Service: Endoscopy;;   removal of birthmark  06/08/2013   SKIN CANCER EXCISION  06/23/2012   TYMPANOSTOMY TUBE PLACEMENT  08/06/1978   URETEROSCOPY WITH HOLMIUM LASER LITHOTRIPSY Right 01/25/2016   Procedure: URETEROSCOPY WITH HOLMIUM LASER LITHOTRIPSY;  Surgeon: Redell Lynwood Napoleon, MD;  Location: [facility] ORS;  Service: Urology;  Laterality: Right;   URETHRAL STRICTURE DILATATION     visual inspection of vocal cord     1973   WISDOM TOOTH EXTRACTION     Patient Active Problem List   Diagnosis Date Noted   Nausea and vomiting 01/07/2024   Malnutrition of moderate degree 01/01/2024   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) 12/30/2023   Acute right ACA stroke (HCC) 12/24/2023   Acute CVA (cerebrovascular accident) (HCC) 12/18/2023   Psychomotor agitation 12/11/2023   Sepsis due to gram-negative UTI (HCC) 12/09/2023   Abnormal brain MRI 12/09/2023   Dyslipidemia 12/09/2023   Dysphagia 09/14/2023   Gait disorder 09/14/2023   Seizure disorder (HCC) 09/10/2023   History of CVA (cerebrovascular accident) 09/09/2023   Essential hypertension 09/09/2023   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 Pt expresses he wants to stop O.T. next week at last scheduled appt when asked (does not want to continue)   Pt aphasic - verbal but unable to communicate what he wanted. Pt did  have occasional automatic appropriate responses.  Wife reports he had first stroke in March, possibly another smaller stroke, then this new stroke end of June with worsened symptoms. 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  Presented to the hospital 09/14/2023 with worsening gait left-sided weakness/aphasia and new onset dysphagia.  Hospitalized from 3-19 to 3-21 with CVA and breakthrough seizures.    PRECAUTIONS: Fall and Other: impulsive, decreased awareness, Lt visual field loss, seizures (recent one in beginning of July - 7th or 8th)   WEIGHT BEARING RESTRICTIONS: No  PAIN:  Are you having pain? No  FALLS: Has patient fallen in last 6 months? Yes. Number of falls 3  LIVING ENVIRONMENT: Lives with: lives with their family (wife, 79 y.o. dtr, 58 y.o. 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  HAND DOMINANCE: Left, but now using Rt hand  ADLs: Overall ADLs: Pt requires 24 hr supervison Transfers/ambulation related to ADLs: pt not using walker safety/correctly often lifting it Eating: mod I eating with Rt hand since March  (first stroke), wife cuts food  Grooming: mod I with Rt hand, wife trimming beard and/or shaving UB Dressing: independent LB Dressing: assist for Lt shoe and sock, and occasionally RT sock Toileting: independent Bathing: distant sup (wife reports she's not allowed in there - pt will not let her assist)  Tub Shower transfers: mod I  Equipment: none  IADLs: dep for all IADLS Community mobility: Has not driven since Nov 2024 d/t seizure, then more seizures, then first stroke Medication management: wife giving pt all medications Financial management: dep Handwriting: unable d/t weakness and aphasia but able to sign name only with assist from Lt hand  MOBILITY STATUS: using walker but often lifts it and does not use correctly   FUNCTIONAL OUTCOME MEASURES: TBA  UPPER EXTREMITY ROM:  RUE AROM WNLs LUE more limited at shoulder but distally WFLs   Active ROM Right eval Left eval  Shoulder flexion  90*  Shoulder abduction    Shoulder adduction    Shoulder extension    Shoulder internal rotation  WFL's  Shoulder external rotation  Approx 50%  Elbow flexion    Elbow extension    Wrist flexion    Wrist extension  Wrist ulnar deviation    Wrist radial deviation    Wrist pronation    Wrist supination    (Blank rows = not tested)    HAND FUNCTION: Grip strength: Right: 60.4 lbs; Left: 41.8 lbs 02/08/24: Lt = 35 lbs  COORDINATION: Pt can pick up pen Lt hand but cannot manipulate (? Apraxia as well as decreased coordination) - pt has had decline in coordination since recent new stroke Box & Blocks: Lt = 9  SENSATION: Appears WFL's - unable to localize stimulation but ? If this is due to aphasia  EDEMA: none  MUSCLE TONE: LUE: Mild and Hypertonic  COGNITION: Overall cognitive status: Impaired and significantly impaired expressive aphasia, and moderate receptive aphasia. Pt easily frustrated trying to express himself. Pt also impulsive  VISION: Subjective report: per  wife: vision has decreased more, but impaired prior to first stroke Baseline vision: Wears glasses for reading only Visual history: changes in vision prior to CVA with necrosis from radiation  VISION ASSESSMENT: Impaired Visual Fields: Left homonymous hemianopsia  Decreased general awareness and attention also - especially to Lt side  PERCEPTION: Impaired: Inattention/neglect: does not attend to left visual field and does not attend to left side of body  PRAXIS: Impaired: Ideomotor  OBSERVATIONS: Lt side improving from first stroke, then recent stroke made it worse. Pt becomes very agitated/frustrated when he is unable to communicate what he wants to say and gets mad at spouse when she tries to speak (for clarification).                                                                                                                              TREATMENT DATE: 02/25/24  Assessed STG's - see below.  Pt does not wish to continue with O.T. past scheduled appts  Practiced writing name (in print) x 3 with Lt hand copying from above line with approx 85% legibility using built up pen  Crossing out double numbers (73M print size) with 5 omissions 1 page - approx 80% accuracy  Environmental scanning finding 11/15 (73.3% accuracy) on first pass, missing 3 on LT side, 1 on Rt side. Pt required mod cueing to go slower, look closely and to Lt edge to find 2/4 remaining objects, found other 2 w/o cues on 2nd pass.   Flipping cards over w/ Lt hand for coordination while calling out numbers for attention and speech. Pt calling out card #s w/ > 95% accuracy and occasional cues  PATIENT EDUCATION: Education details: visual scanning strategies and activities, coordination HEP  Person educated: Patient and Spouse Education method: Explanation, Demonstration, Tactile cues, Verbal cues, and Handouts Education comprehension: wife verbalized understanding, returned demonstration, verbal cues required, and  needs further education  HOME EXERCISE PROGRAM: 01/27/24: visual scanning strategies and activities, coordination HEP    GOALS: Goals reviewed with patient? Yes   SHORT TERM GOALS: Target date: 02/20/24  Pt to be independent donning/doffing Lt shoe and both socks Baseline:  Goal status: PARTIALLY MET - inconsistent, pt able to do with shoe buttons in clinic (has not yet ordered), but wife ties shoes  2.  Pt will be able to demo management of anger when he gets frustrated Baseline:  Goal status: DEFERRED - pt is doing much better and no longer needed.  3.  Pt/family to verbalize understanding of visual scanning strategies and pt able to recall at least 2 compensatory strategies for visual impairment without cueing Baseline:  Goal status: unable to verbalize, NOT consistently met  4.  Pt independent with HEP for LT shoulder ROM and Lt hand coordination Baseline:  Goal status: MET   5.  Pt will improve LUE function as evidenced by performing 15 blocks on Box & Blocks test Baseline: 9 blocks Goal status: NOT MET (02/25/24 = 11 blocks)    LONG TERM GOALS: Target date: 03/22/24  Pt will be independent with updated HEP  Baseline:  Goal status: INITIAL  2.  Patient will return demonstration of visual adaptation use and verbalize understanding of how to obtain item(s) if desired.  Baseline:  Goal status: INITIAL  3.  Patient will demo improved FM coordination as evidenced by completing nine-hole peg with LUE use and assist from RUE prn in 60 sec or under Baseline: unable Lt hand Goal status: INITIAL  4.  Pt with improve LUE function as evidenced by performing 20 or more blocks on Box & Blocks Baseline: 9 blocks Goal status: INITIAL  5.  Pt to write name Lt hand with 90% legibility Baseline:  Goal status: INITIAL  6.  Pt will perform environmental scanning safely with min cues for 75% accuracy Baseline:  Goal status: INITIAL  ASSESSMENT:  CLINICAL IMPRESSION: Patient has  met 1.5/4 STG's. Limited by decreased awareness/insight and attention, awareness to Lt side, decreased coordination/use of LUE. Pt wishes to d/c next week  PERFORMANCE DEFICITS: in functional skills including ADLs, IADLs, coordination, proprioception, tone, ROM, strength, Fine motor control, Gross motor control, mobility, balance, body mechanics, endurance, decreased knowledge of precautions, decreased knowledge of use of DME, vision, and UE functional use, cognitive skills including attention, perception, and safety awareness, and psychosocial skills including environmental adaptation.   IMPAIRMENTS: are limiting patient from ADLs, IADLs, rest and sleep, leisure, and social participation.   CO-MORBIDITIES: has co-morbidities such as oligodendroglioma, seizures that affects occupational performance. Patient will benefit from skilled OT to address above impairments and improve overall function.  MODIFICATION OR ASSISTANCE TO COMPLETE EVALUATION: Min-Moderate modification of tasks or assist with assess necessary to complete an evaluation.  OT OCCUPATIONAL PROFILE AND HISTORY: Detailed assessment: Review of records and additional review of physical, cognitive, psychosocial history related to current functional performance.  CLINICAL DECISION MAKING: Moderate - several treatment options, min-mod task modification necessary  REHAB POTENTIAL: Fair decreased insight/awareness into deficits, decreased attention to Lt side, aphasia  EVALUATION COMPLEXITY: Moderate    PLAN:  OT FREQUENCY: 2x/week  OT DURATION: 8 weeks  PLANNED INTERVENTIONS: 97535 self care/ADL training, 02889 therapeutic exercise, 97530 therapeutic activity, 97112 neuromuscular re-education, 97140 manual therapy, 97010 moist heat, 97129 Cognitive training (first 15 min), 02869 Cognitive training(each additional 15 min), 02239 Orthotic Initial, 97763 Orthotic/Prosthetic subsequent, passive range of motion, balance training,  functional mobility training, visual/perceptual remediation/compensation, energy conservation, coping strategies training, patient/family education, and DME and/or AE instructions  RECOMMENDED OTHER SERVICES: Pt scheduled for upcoming P.T. and SLP evaluations.   CONSULTED AND AGREED WITH PLAN OF CARE: Patient and family member/caregiver  PLAN FOR NEXT SESSION: progress  as able towards LTG's - anticipate d/c next week per pt request   Burnard JINNY Roads, OT 02/25/2024, 9:28 AM

## 2024-02-26 ENCOUNTER — Other Ambulatory Visit: Payer: Self-pay | Admitting: Internal Medicine

## 2024-02-26 ENCOUNTER — Telehealth: Payer: Self-pay | Admitting: *Deleted

## 2024-02-29 ENCOUNTER — Ambulatory Visit: Admitting: Occupational Therapy

## 2024-02-29 ENCOUNTER — Ambulatory Visit: Payer: Self-pay | Admitting: Speech Pathology

## 2024-02-29 ENCOUNTER — Encounter: Payer: Self-pay | Admitting: Occupational Therapy

## 2024-02-29 ENCOUNTER — Encounter: Payer: Self-pay | Admitting: Speech Pathology

## 2024-02-29 DIAGNOSIS — R4701 Aphasia: Secondary | ICD-10-CM

## 2024-02-29 DIAGNOSIS — M6281 Muscle weakness (generalized): Secondary | ICD-10-CM

## 2024-02-29 DIAGNOSIS — R278 Other lack of coordination: Secondary | ICD-10-CM | POA: Diagnosis not present

## 2024-02-29 DIAGNOSIS — R41840 Attention and concentration deficit: Secondary | ICD-10-CM

## 2024-02-29 DIAGNOSIS — R2681 Unsteadiness on feet: Secondary | ICD-10-CM

## 2024-02-29 DIAGNOSIS — R41841 Cognitive communication deficit: Secondary | ICD-10-CM

## 2024-02-29 DIAGNOSIS — R41842 Visuospatial deficit: Secondary | ICD-10-CM

## 2024-02-29 NOTE — Patient Instructions (Signed)
 Cranial Flexion: Overhead Arm Extension - Supine ([facility])    Lie with knees bent, arms beyond head, holding paper towel roll or shoe box (palms facing). Pull ball up to above face. Repeat __10__ times per set. Do __2__ sets per day.

## 2024-02-29 NOTE — Therapy (Signed)
 OUTPATIENT OCCUPATIONAL THERAPY NEURO TREATMENT/DISCHARGE   Patient Name: Alan Wise MRN: [id number] DOB:December 07, 1970, 53 y.o., male, male Today's Date: 02/29/2024  PCP: Antonette Angeline ORN, NP  REFERRING PROVIDER: Pegge Toribio PARAS, PA-C   OCCUPATIONAL THERAPY DISCHARGE SUMMARY  Visits from Start of Care: 9  Current functional level related to goals / functional outcomes: See below goal section   Remaining deficits: LUE ROM LUE strength LUE coordination Visual and visual/perceptual skills Insight into deficits Attention   Education / Equipment: See below   Patient agrees to discharge. Patient goals were partially met. Patient is being discharged due to the patient's request..     END OF SESSION:  OT End of Session - 02/29/24 0846     Visit Number 9    Number of Visits 16    Date for OT Re-Evaluation 03/22/24    Authorization Type [employer] 2025 OOPM MET    Authorization Time Period Covered 100% VL:MN Auth Not Reqd    OT Start Time 0845    OT Stop Time 0930    OT Time Calculation (min) 45 min    Activity Tolerance Patient tolerated treatment well    Behavior During Therapy Agitated;Impulsive          Past Medical History:  Diagnosis Date   Allergy    Complication of anesthesia    pt reports he is starting to have more difficulty arousing after surgery   Diabetes mellitus (HCC)    GERD (gastroesophageal reflux disease)    Headache    History of kidney stones    Hyperlipidemia    Renal disorder    kidney stones   Past Surgical History:  Procedure Laterality Date   APPLICATION OF CRANIAL NAVIGATION Right 01/17/2021   Procedure: APPLICATION OF CRANIAL NAVIGATION;  Surgeon: Debby Dorn MATSU, MD;  Location: [facility] OR;  Service: Neurosurgery;  Laterality: Right;   COLONOSCOPY  09/03/1994   COLONOSCOPY WITH PROPOFOL  N/A 01/07/2023   Procedure: COLONOSCOPY WITH PROPOFOL ;  Surgeon: Jinny Carmine, MD;  Location: [facility] ENDOSCOPY;  Service: Endoscopy;  Laterality:  N/A;  NOT TOO EARLY   CYSTOSCOPY WITH STENT PLACEMENT Right 01/25/2016   Procedure: CYSTOSCOPY WITH STENT PLACEMENT;  Surgeon: Redell Lynwood Napoleon, MD;  Location: [facility] ORS;  Service: Urology;  Laterality: Right;   FRAMELESS  BIOPSY WITH BRAINLAB Right 01/17/2021   Procedure: RIGHT STEREOTACTIC BIOPSY OF INSULAR LESION;  Surgeon: Debby Dorn MATSU, MD;  Location: [facility] OR;  Service: Neurosurgery;  Laterality: Right;   KNEE ARTHROSCOPY W/ MENISCAL REPAIR Right 06/23/1988   POLYPECTOMY  01/07/2023   Procedure: POLYPECTOMY;  Surgeon: Jinny Carmine, MD;  Location: [facility] ENDOSCOPY;  Service: Endoscopy;;   removal of birthmark  06/08/2013   SKIN CANCER EXCISION  06/23/2012   TYMPANOSTOMY TUBE PLACEMENT  08/06/1978   URETEROSCOPY WITH HOLMIUM LASER LITHOTRIPSY Right 01/25/2016   Procedure: URETEROSCOPY WITH HOLMIUM LASER LITHOTRIPSY;  Surgeon: Redell Lynwood Napoleon, MD;  Location: [facility] ORS;  Service: Urology;  Laterality: Right;   URETHRAL STRICTURE DILATATION     visual inspection of vocal cord     1973   WISDOM TOOTH EXTRACTION     Patient Active Problem List   Diagnosis Date Noted   Nausea and vomiting 01/07/2024   Malnutrition of moderate degree 01/01/2024   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) 12/30/2023   Acute right ACA stroke (HCC) 12/24/2023   Acute CVA (cerebrovascular accident) (HCC) 12/18/2023   Psychomotor agitation 12/11/2023   Sepsis due to gram-negative UTI (  HCC) 12/09/2023   Abnormal brain MRI 12/09/2023   Dyslipidemia 12/09/2023   Dysphagia 09/14/2023   Gait disorder 09/14/2023   Seizure disorder (HCC) 09/10/2023   History of CVA (cerebrovascular accident) 09/09/2023   Essential hypertension 09/09/2023   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    (02/29/24: Spoke with pt's wife via telephone re: d/c plans. She reports pt is now living with mother. Pt and pt's mother given feedback on what to continue practicing at home)  Pt aphasic - verbal but unable to communicate what he wanted. Pt did have occasional automatic appropriate responses.  Wife reports he had first stroke in March, possibly another smaller stroke, then this new stroke end of June with worsened symptoms. 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  Presented to the hospital 09/14/2023 with worsening gait left-sided weakness/aphasia and new onset dysphagia.  Hospitalized from 3-19 to 3-21 with CVA and breakthrough seizures.    PRECAUTIONS: Fall and Other: impulsive, decreased awareness, Lt visual field loss, seizures (recent one in beginning of July - 7th or 8th)   WEIGHT BEARING RESTRICTIONS: No  PAIN:  Are you having pain? No  FALLS: Has patient fallen in last 6 months? Yes. Number of falls 3  LIVING ENVIRONMENT: Lives with: lives with their family (wife, 37 y.o. dtr, 70 y.o. 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  HAND DOMINANCE: Left, but now using Rt hand  ADLs: Overall ADLs: Pt requires 24 hr supervison Transfers/ambulation related to ADLs: pt not using walker safety/correctly often lifting it Eating: mod I eating with Rt hand since March (first stroke), wife cuts food  Grooming: mod I with Rt hand, wife trimming beard and/or shaving UB Dressing: independent LB Dressing: assist for Lt shoe and sock, and occasionally RT sock Toileting: independent Bathing: distant sup (wife reports she's not allowed in there - pt will not let her assist)  Tub Shower transfers: mod I  Equipment: none  IADLs: dep for all IADLS Community mobility: Has not driven since Nov 2024 d/t seizure, then more seizures, then first stroke Medication management: wife giving pt all medications Financial management: dep Handwriting: unable d/t weakness and aphasia but able to  sign name only with assist from Lt hand  MOBILITY STATUS: using walker but often lifts it and does not use correctly   FUNCTIONAL OUTCOME MEASURES: TBA  UPPER EXTREMITY ROM:  RUE AROM WNLs LUE more limited at shoulder but distally WFLs   Active ROM Right eval Left eval  Shoulder flexion  90*  Shoulder abduction    Shoulder adduction    Shoulder extension    Shoulder internal rotation  WFL's  Shoulder external rotation  Approx 50%  Elbow flexion    Elbow extension    Wrist flexion    Wrist extension    Wrist ulnar deviation    Wrist radial deviation    Wrist pronation    Wrist supination    (Blank rows = not tested)    HAND  FUNCTION: Grip strength: Right: 60.4 lbs; Left: 41.8 lbs 02/08/24: Lt = 35 lbs  COORDINATION: Pt can pick up pen Lt hand but cannot manipulate (? Apraxia as well as decreased coordination) - pt has had decline in coordination since recent new stroke Box & Blocks: Lt = 9  SENSATION: Appears WFL's - unable to localize stimulation but ? If this is due to aphasia  EDEMA: none  MUSCLE TONE: LUE: Mild and Hypertonic  COGNITION: Overall cognitive status: Impaired and significantly impaired expressive aphasia, and moderate receptive aphasia. Pt easily frustrated trying to express himself. Pt also impulsive  VISION: Subjective report: per wife: vision has decreased more, but impaired prior to first stroke Baseline vision: Wears glasses for reading only Visual history: changes in vision prior to CVA with necrosis from radiation  VISION ASSESSMENT: Impaired Visual Fields: Left homonymous hemianopsia  Decreased general awareness and attention also - especially to Lt side  PERCEPTION: Impaired: Inattention/neglect: does not attend to left visual field and does not attend to left side of body  PRAXIS: Impaired: Ideomotor  OBSERVATIONS: Lt side improving from first stroke, then recent stroke made it worse. Pt becomes very agitated/frustrated when he is unable to communicate what he wants to say and gets mad at spouse when she tries to speak (for clarification).                                                                                                                              TREATMENT DATE: 02/29/24  Assessed progress towards LTG's - pt actually doing much better with Lt hand coordination today. See below for details w/ improvements on Box & Blocks and 9 hole peg test  Pt writing name x 2 with 95% legibility w/o needing to copy  Pt issued HEP for shoulder flexion supine - see pt instructions for details. Pt demo x 10 reps with initial min cues  UBE x 5 min, level 1 for normal  reciprocal movement pattern and UB conditioning   Discussed with pt's mother recommendations for continuing activities at home and in community to possibly make further progress including: coordination activities for Lt hand (pt has  HEP), stretch/ROM to Lt shoulder, puzzles (24-48 pc) and card games for visual scanning and coordination, and going with her to grocery store for environmental scanning). Pt's mother verbalized understanding.   PATIENT EDUCATION: Education details: shoulder flexion HEP Person educated: Patient Education method: Programmer, multimedia, Demonstration, Verbal cues, and Handouts Education comprehension: wife verbalized understanding, returned demonstration, and verbal cues required  HOME EXERCISE PROGRAM: 01/27/24: visual scanning strategies and activities, coordination HEP  02/29/24: shoulder flexion HEP    GOALS: Goals reviewed with patient? Yes   SHORT TERM GOALS: Target date: 02/20/24  Pt to be independent donning/doffing Lt shoe and both socks Baseline: Goal status: PARTIALLY MET - inconsistent, pt able to do with shoe buttons in clinic (has not yet ordered), but wife ties shoes  2.  Pt will be able to demo management of anger when he gets frustrated Baseline:  Goal status: DEFERRED - pt is doing better with this in O.T. (Although this is not consistent in speech or at home)   3.  Pt/family to verbalize understanding of visual scanning strategies and pt able to recall at least 2 compensatory strategies for visual impairment without cueing Baseline:  Goal status: unable to verbalize, NOT consistently met  4.  Pt independent with HEP for LT shoulder ROM and Lt hand coordination Baseline:  Goal status: MET   5.  Pt will improve LUE function as evidenced by performing 15 blocks on Box & Blocks test Baseline: 9 blocks Goal status: MET (02/25/24 = 11 blocks, 02/29/24: 17 blocks)    LONG TERM GOALS: Target date: 03/22/24  Pt will be independent with updated HEP   Baseline:  Goal status: MET (shoulder ex)   2.  Patient will return demonstration of visual adaptation use and verbalize understanding of how to obtain item(s) if desired.  Baseline:  Goal status: NOT MET  3.  Patient will demo improved FM coordination as evidenced by completing nine-hole peg with LUE use and assist from RUE prn in 60 sec or under Baseline: unable Lt hand Goal status: MET (02/29/24: 60 sec)  4.  Pt with improve LUE function as evidenced by performing 20 or more blocks on Box & Blocks Baseline: 9 blocks Goal status: NOT MET (02/29/24: 17 blocks)   5.  Pt to write name Lt hand with 90% legibility Baseline:  Goal status: MET (02/29/24: 90-95%)   6.  Pt will perform environmental scanning safely with min cues for 75% accuracy Baseline:  Goal status: NOT consistently met (73%)   ASSESSMENT:  CLINICAL IMPRESSION: Patient has met 2.5/4 STG's and 2/6 LTG's. Limited by decreased awareness/insight and attention, awareness to Lt side, as well as decreased coordination/use of LUE. D/C due to pt's request and lack of overall carryover at home  PERFORMANCE DEFICITS: in functional skills including ADLs, IADLs, coordination, proprioception, tone, ROM, strength, Fine motor control, Gross motor control, mobility, balance, body mechanics, endurance, decreased knowledge of precautions, decreased knowledge of use of DME, vision, and UE functional use, cognitive skills including attention, perception, and safety awareness, and psychosocial skills including environmental adaptation.   IMPAIRMENTS: are limiting patient from ADLs, IADLs, rest and sleep, leisure, and social participation.   CO-MORBIDITIES: has co-morbidities such as oligodendroglioma, seizures that affects occupational performance. Patient will benefit from skilled OT to address above impairments and improve overall function.  MODIFICATION OR ASSISTANCE TO COMPLETE EVALUATION: Min-Moderate modification of tasks or assist with  assess necessary to complete an evaluation.  OT OCCUPATIONAL PROFILE AND HISTORY: Detailed assessment: Review of records and  additional review of physical, cognitive, psychosocial history related to current functional performance.  CLINICAL DECISION MAKING: Moderate - several treatment options, min-mod task modification necessary  REHAB POTENTIAL: Fair decreased insight/awareness into deficits, decreased attention to Lt side, aphasia  EVALUATION COMPLEXITY: Moderate    PLAN:  OT FREQUENCY: 2x/week  OT DURATION: 8 weeks  PLANNED INTERVENTIONS: 97535 self care/ADL training, 02889 therapeutic exercise, 97530 therapeutic activity, 97112 neuromuscular re-education, 97140 manual therapy, 97010 moist heat, 97129 Cognitive training (first 15 min), 02869 Cognitive training(each additional 15 min), 02239 Orthotic Initial, 97763 Orthotic/Prosthetic subsequent, passive range of motion, balance training, functional mobility training, visual/perceptual remediation/compensation, energy conservation, coping strategies training, patient/family education, and DME and/or AE instructions  RECOMMENDED OTHER SERVICES: Pt scheduled for upcoming P.T. and SLP evaluations.   CONSULTED AND AGREED WITH PLAN OF CARE: Patient and family member/caregiver  PLAN  D/C O.T.   Burnard JINNY Roads, OT 02/29/2024, 8:47 AM

## 2024-02-29 NOTE — Therapy (Signed)
 OUTPATIENT SPEECH LANGUAGE PATHOLOGY APHASIA TREATMENT   Patient Name: Alan Wise MRN: [id number] DOB:1971-04-25, 53 y.o., male Today's Date: 02/29/2024  PCP: Angeline Dalen [place], NP REFERRING PROVIDER: Toribio Pitch, PA-C  END OF SESSION:  End of Session - 02/29/24 0932     Visit Number 7    Number of Visits 25    Date for SLP Re-Evaluation 04/14/24    Authorization Type [employer]    SLP Start Time 0930    SLP Stop Time  1015    SLP Time Calculation (min) 45 min    Activity Tolerance Patient tolerated treatment well            Past Medical History:  Diagnosis Date   Allergy    Complication of anesthesia    pt reports he is starting to have more difficulty arousing after surgery   Diabetes mellitus (HCC)    GERD (gastroesophageal reflux disease)    Headache    History of kidney stones    Hyperlipidemia    Renal disorder    kidney stones   Past Surgical History:  Procedure Laterality Date   APPLICATION OF CRANIAL NAVIGATION Right 01/17/2021   Procedure: APPLICATION OF CRANIAL NAVIGATION;  Surgeon: Debby Dorn MATSU, MD;  Location: [facility] OR;  Service: Neurosurgery;  Laterality: Right;   COLONOSCOPY  09/03/1994   COLONOSCOPY WITH PROPOFOL  N/A 01/07/2023   Procedure: COLONOSCOPY WITH PROPOFOL ;  Surgeon: Jinny Carmine, MD;  Location[facility]COPY;  Service: Endoscopy;  Laterality: N/A;  NOT TOO EARLY   CYSTOSCOPY WITH STENT PLACEMENT Right 01/25/2016   Procedure: CYSTOSCOPY WITH STENT PLACEMENT;  Surgeon: Redell Lynwood Napoleon, MD;  Location: [facility] ORS;  Service: Urology;  Laterality: Right;   FRAMELESS  BIOPSY WITH BRAINLAB Right 01/17/2021   Procedure: RIGHT STEREOTACTIC BIOPSY OF INSULAR LESION;  Surgeon: Debby Dorn MATSU, MD;  Location: [facility] OR;  Service: Neurosurgery;  Laterality: Right;   KNEE ARTHROSCOPY W/ MENISCAL REPAIR Right 06/23/1988   POLYPECTOMY  01/07/2023   Procedure: POLYPECTOMY;  Surgeon: Jinny Carmine, MD;  Location: [facility] ENDOSCOPY;  Service:  Endoscopy;;   removal of birthmark  06/08/2013   SKIN CANCER EXCISION  06/23/2012   TYMPANOSTOMY TUBE PLACEMENT  08/06/1978   URETEROSCOPY WITH HOLMIUM LASER LITHOTRIPSY Right 01/25/2016   Procedure: URETEROSCOPY WITH HOLMIUM LASER LITHOTRIPSY;  Surgeon: Redell Lynwood Napoleon, MD;  Location: [facility] ORS;  Service: Urology;  Laterality: Right;   URETHRAL STRICTURE DILATATION     visual inspection of vocal cord     1973   WISDOM TOOTH EXTRACTION     Patient Active Problem List   Diagnosis Date Noted   Nausea and vomiting 01/07/2024   Malnutrition of moderate degree 01/01/2024   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) 12/30/2023   Acute right ACA stroke (HCC) 12/24/2023   Acute CVA (cerebrovascular accident) (HCC) 12/18/2023   Psychomotor agitation 12/11/2023   Sepsis due to gram-negative UTI (HCC) 12/09/2023   Abnormal brain MRI 12/09/2023   Dyslipidemia 12/09/2023   Dysphagia 09/14/2023   Gait disorder 09/14/2023   Seizure disorder (HCC) 09/10/2023   History of CVA (cerebrovascular accident) 09/09/2023   Essential hypertension 09/09/2023   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   Type 2 diabetes mellitus with hyperlipidemia (HCC) 06/12/2014   Hyperlipidemia associated with type 2 diabetes mellitus (HCC) 06/12/2014    ONSET DATE: 01/14/24 referred   REFERRING DIAG: Acute right ACA stroke   THERAPY DIAG: Aphasia  Cognitive communication deficit  Rationale for Evaluation and Treatment: Rehabilitation  SUBJECTIVE:   SUBJECTIVE STATEMENT: Opening conversation c/b vague speech, perseverations, and circumlocution Pt accompanied by: significant other  PERTINENT HISTORY: history of T2DM- A1c 5.4, GERD, renal calculi, 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 Dr. Buckley felt this was secondary to evolving radiation necrosis v/s novel small vessel infracts and was changed to Eliquis  and abilify   briefly added for ongoing behavorial issues.  He was readmitted on 12/18/23 with recurrent falls with left sided weakness. Repeat MRI brain done revaling new 3.2 X 1.3 cm focus of right basal ganglia infarct  and recent right temporal restricted diffusion no longer present. EEG was negative for seizures and showed cortical dysfunction arising from right fronto-temporal region likely due to underlying structural abnormality. Neurology recommended increasing Lamictal  dose for possible partial seizures.    PAIN:  Are you having pain? No   PLOF:  Level of assistance: Independent with ADLs Employment: On disability  PATIENT GOALS: pt unable to state, spouse desires improved communication, decreased frustration  OBJECTIVE:  Note: Objective measures were completed at Evaluation unless otherwise noted.  Inpatient Rehab ST: Clinical Impression/Discharge Summary:  Pt made slight progress this stay, as evidenced by mastery of 5/5 LTGs. Severe to profound expressive and moderate receptive aphasia remain at this time. He requires cues to clear L buccal cavity and maintain slow rate, but is tolerating D3 textures/thin liquids w/ minA. Frustration tolerance and receptiveness to education/cueing will continue to limit his overall progress, however, recommend cont ST upon d/c to target remaining deficits, maximize pt communication, and maximize pt independence.   COGNITION: Overall cognitive status: Difficulty to assess due to: Communication impairment  AUDITORY COMPREHENSION: Overall auditory comprehension: Impaired: simple, moderately complex, and complex YES/NO questions: Impaired: moderately complex and  complex Following directions: Appears intact, single step. Did not attempt multi-step Conversation: Impaired, Simple Interfering components: attention, processing speed, and working memory Effective technique: extra processing time, repetition/stressing words, slowed speech, and stressing words  READING COMPREHENSION: Impaired: L neglect  EXPRESSION: verbal  VERBAL EXPRESSION: Level of generative/spontaneous verbalization: conversation Automatic speech: name: impaired, social response: impaired, counting: intact, and day of week: impaired  Repetition: Impaired: Word Naming: Confrontation: 0% accuracy Pragmatics: Impaired: abnormal effect, topic appropriateness, topic maintenance, and turn taking Interfering components: auditory comprehension, attention  Effective technique: sentence completion, phonemic cues, and articulatory cues Non-verbal means of communication: none  WRITTEN EXPRESSION: Dominant hand: right Written expression: Impaired: word  MOTOR SPEECH: Overall motor speech: Appears intact  ORAL MOTOR EXAMINATION: Overall status: Impaired:   Lingual: Left (ROM and Symmetry)  STANDARDIZED ASSESSMENTS: QAB severe impairment Word comprehension 10.00  Sentence comprehension 0.83  Word finding 0.00  Grammatical construction 2.00  Speech motor programming 10.00  Repetition 0.00  Reading 7.92  QAB overall 4.02   PATIENT REPORTED OUTCOME MEASURES (PROM): Deferred d/t severity of impairment  TREATMENT DATE:   02/29/24: Ruford has been discharged for OT and PT - he is electing to continue ST for his remaining visits. I am in agreement. He elects to defer use of SGD at this time. I will return the device to [employer]. Targeted word finding and multimodal communication generating autumn words - generated 7 words with consistent max semantic and written  cues. In conversation re: Halloween costumes, with consistent max A - Khalif communicated that he dressed up even as he got older - max A to generate 3 costumes with semantic, questioning and written cues. Targeted simple money and mental math - despite max A, counting bills and change aloud, Avett unable to write amount and indicated that he could not do mental math.   02/17/24: Pt arrives alone - does not have SGD. He was not scheduled to see me but I had a cancellation. Targeted word finding and written expression in personally relevant categories - with fill in the blank cues, Trevonte named 6 stores they frequent and wrote them with usual mod cues to correct errors. Due to hand  fatigue, we stopped writing- named his 7 burger toppings and 5 school supplies with fill in the blank cues - after Naksh read the lists with extended time. In conversation he used verbal compensation of yay to express he was glad to be seen alone today. Attempts to use gestures result unsuccessful despite max A. Instructed him to bring SGD next visit  02/10/24: Targeted training on Touch Talk device - [name] added 10 icons, including folders for places and biographical information. Waris selected icons to consistent written and verbal cues with cues to scan left, 8/10x with extended time. Targeted written expression while [name] was customizing device - he wrote family names with occasional min A to ID errors and consistent copy cues to correct errors wrote 4/5 family names correctly, 3 fruits and 3 tools, again with copy cues required to correct errors.  02/08/24: Introduced Touch Talk AAC device - demonstrated how to add icon and folder, use the photo option. 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 She is aware that she will need to initiate use of Touch Talk. Staley had multiple episodes of becoming belligerent and agitated shaking his fists and yelling out. [name] reports this happens throughout the day  and he has lashed out physically a few times, hitting her arm. He is up at night, waking her without awareness. Overall awareness, attention and decision making severely impaired. Encouraged him to try Seroquel  as prescribed by his MD. Tinnie reports he refuses to take new meds, today he agreed to try this med tonight. Psych consult pending.   02/02/24: Utilize interest and then tolerate to generate list of salient topics for future SGD customization.  Patient identifies the following: Eating out, family, food and meals, movies and TV, news and current events, personal needs, pets, sports, conflict.  For food and meal patient would benefit from folder dedicated to getting instruction to wife for preparation of preferred meals, including ingredients.  For support patient would benefit from folder for conversation with kids regarding their sports involvement including soccer T-ball and basketball.  Utilizing [facility] board and writing as multimodal communication patient correctly answered 4 of 5 personal questions.  ABC board provided for use at home as patient continues to benefit from first letter cues for anomia repair.   01/26/24: Given significant aphasia, trialed multimodal communication today, including writing and [employer] Touch[employer] device. Pt able to write wife's  name with min error to augment naming. Otherwise, writing inconsistent with perseverations and agraphia. Demonstrated how to functionally utilize TouchTalk device for naming preferred foods items. Good oral reading accuracy exhibited. Able to ID preferred choice f/o 3 with good accuracy. Generated AAC categories related to his children. Required usual max A to name interests of children. Cued gestures were beneficial x2. Pt and wife interested in pursuing SGD trials, with SLP submitting information this session.  01/21/24: Explained rationale for speech generating device recommendation. Discussed benefits of increased independence with communication  and to support rehabilitation. Discussed severity of impairment and prior level of functioning supporting likelihood of slow rehab course, and benefit of SGD for improved communication while rehabilitating. Pt does not desire AAC at this time   PATIENT EDUCATION: Education details: POC/goals Person educated: Patient and Spouse Education method: Explanation Education comprehension: verbalized understanding and needs further education   GOALS: Goals reviewed with patient? Yes  SHORT TERM GOALS: Target date: 03/17/2024  Patient will use multimodal communication to communicate basic wants and needs with rare min-A Baseline: Goal status: ONGOING  2.  Patient and caregivers will generate x10 functional home-based targets for low or high tech AAC system  Baseline:  Goal status: ONGOING  3.  Patient will complete word level structured language tasks with mod-A  Baseline:  Goal status: ONGOING  4.  Pt will demonstrate accurate gesture for given target with 80% accuracy  Baseline:  Goal status: ONGOING   LONG TERM GOALS: Target date: 04/14/2024  Pt will use multimodal communication to communicate preferences in simple conversation  Baseline:  Goal status: ONGOING  2.  Pt and caregivers will carryover 3 compensatory strategies to support pt's auditory comprehension  Baseline:  Goal status: ONGOING  3.  Pt and caregiver will demonstrate supportive communication techniques in conversation to support pt expressive language Baseline:  Goal status: ONGOING  4.  Pt and spouse will carryover 1-2 language enhancing activities at home daily 5/7 days  Baseline:  Goal status: ONGOING  5.  Pt will generate moderately complex sentences in structured task such as VNeST with usual min A  Baseline:  Goal status: ONGOING   ASSESSMENT:  CLINICAL IMPRESSION: Patient is a 53 y.o. M who was seen today for aphasia tx. Known to clinic from prior ST course, since has had another stroke.  Communication impairment during prior ST course was severe, profound impairment at this time. Pt presenting with fluent aphsia c/b nonsensical speech that lacks meaning, tangential speech, usual use of a few rote phrases repetitively (the thing, the thing, that which is). During picture description task, pt says that, that, that with no content.  Benefit observed from phonemic and phrase completion cues to aid in anomia recovery.  Patient unable to repeat, challenges with answering yes no questions.  Per spouse primary means of communication was asking yes/no questions prior to most recent stroke, this new impairment has significantly impacted her communication and skills in the home environment.  Patient appears to lack awareness, he typically responds to questions with a long-winded nonsensical speech requiring SLP cues to stop.  Due to severity of impairment and prior level of function I highly recommend implementation of a speech generating device.  At this time patient declines will continue to address in future therapy sessions.  Spouse appears receptive to idea of communication support.   OBJECTIVE IMPAIRMENTS: include attention, awareness, executive functioning, and aphasia. These impairments are limiting patient from effectively communicating at home and in community. Factors affecting potential  to achieve goals and functional outcome are previous level of function and severity of impairments. Patient will benefit from skilled SLP services to address above impairments and improve overall function.  REHAB POTENTIAL: Fair (severity of impairments)  PLAN:  SLP FREQUENCY: 2x/week  SLP DURATION: 12 weeks  PLANNED INTERVENTIONS: Language facilitation, Cueing hierachy, Internal/external aids, Functional tasks, Multimodal communication approach, SLP instruction and feedback, Compensatory strategies, and Patient/family education    Mathis Leita Caldron, CCC-SLP 02/29/2024, 10:13 AM

## 2024-03-02 ENCOUNTER — Encounter: Payer: Self-pay | Admitting: Speech Pathology

## 2024-03-02 ENCOUNTER — Ambulatory Visit: Payer: Self-pay | Admitting: Speech Pathology

## 2024-03-02 DIAGNOSIS — R4701 Aphasia: Secondary | ICD-10-CM

## 2024-03-02 DIAGNOSIS — R278 Other lack of coordination: Secondary | ICD-10-CM | POA: Diagnosis not present

## 2024-03-02 DIAGNOSIS — R41841 Cognitive communication deficit: Secondary | ICD-10-CM

## 2024-03-02 NOTE — Therapy (Signed)
 OUTPATIENT SPEECH LANGUAGE PATHOLOGY APHASIA TREATMENT   Patient Name: Alan Wise MRN: [id number] DOB:1970/08/13, 53 y.o., male Today's Date: 03/02/2024  PCP: Angeline Zyionna Pesce, NP REFERRING PROVIDER: Toribio Pitch, PA-C  END OF SESSION:  End of Session - 03/02/24 1242     Visit Number 8    Number of Visits 25    Date for SLP Re-Evaluation 04/14/24    Authorization Type [employer]    SLP Start Time 1235    SLP Stop Time  1315    SLP Time Calculation (min) 40 min    Activity Tolerance Patient tolerated treatment well            Past Medical History:  Diagnosis Date   Allergy    Complication of anesthesia    pt reports he is starting to have more difficulty arousing after surgery   Diabetes mellitus (HCC)    GERD (gastroesophageal reflux disease)    Headache    History of kidney stones    Hyperlipidemia    Renal disorder    kidney stones   Past Surgical History:  Procedure Laterality Date   APPLICATION OF CRANIAL NAVIGATION Right 01/17/2021   Procedure: APPLICATION OF CRANIAL NAVIGATION;  Surgeon: Debby Dorn MATSU, MD;  Location: [facility] OR;  Service: Neurosurgery;  Laterality: Right;   COLONOSCOPY  09/03/1994   COLONOSCOPY WITH PROPOFOL  N/A 01/07/2023   Procedure: COLONOSCOPY WITH PROPOFOL ;  Surgeon: Jinny Carmine, MD;  Location: [facility] ENDOSCOPY;  Service: Endoscopy;  Laterality: N/A;  NOT TOO EARLY   CYSTOSCOPY WITH STENT PLACEMENT Right 01/25/2016   Procedure: CYSTOSCOPY WITH STENT PLACEMENT;  Surgeon: Redell Lynwood Napoleon, MD;  Location: [facility] ORS;  Service: Urology;  Laterality: Right;   FRAMELESS  BIOPSY WITH BRAINLAB Right 01/17/2021   Procedure: RIGHT STEREOTACTIC BIOPSY OF INSULAR LESION;  Surgeon: Debby Dorn MATSU, MD;  [facility];  Service: Neurosurgery;  Laterality: Right;   KNEE ARTHROSCOPY W/ MENISCAL REPAIR Right 06/23/1988   POLYPECTOMY  01/07/2023   Procedure: POLYPECTOMY;  Surgeon: Jinny Carmine, MD;  Location: [facility] ENDOSCOPY;   Service: Endoscopy;;   removal of birthmark  06/08/2013   SKIN CANCER EXCISION  06/23/2012   TYMPANOSTOMY TUBE PLACEMENT  08/06/1978   URETEROSCOPY WITH HOLMIUM LASER LITHOTRIPSY Right 01/25/2016   Procedure: URETEROSCOPY WITH HOLMIUM LASER LITHOTRIPSY;  Surgeon: Redell Lynwood Napoleon, MD;  Location: [facility] ORS;  Service: Urology;  Laterality: Right;   URETHRAL STRICTURE DILATATION     visual inspection of vocal cord     1973   WISDOM TOOTH EXTRACTION     Patient Active Problem List   Diagnosis Date Noted   Nausea and vomiting 01/07/2024   Malnutrition of moderate degree 01/01/2024   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) 12/30/2023   Acute right ACA stroke (HCC) 12/24/2023   Acute CVA (cerebrovascular accident) (HCC) 12/18/2023   Psychomotor agitation 12/11/2023   Sepsis due to gram-negative UTI (HCC) 12/09/2023   Abnormal brain MRI 12/09/2023   Dyslipidemia 12/09/2023   Dysphagia 09/14/2023   Gait disorder 09/14/2023   Seizure disorder (HCC) 09/10/2023   History of CVA (cerebrovascular accident) 09/09/2023   Essential hypertension 09/09/2023   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   Type 2 diabetes mellitus with hyperlipidemia (HCC) 06/12/2014   Hyperlipidemia associated with type 2 diabetes mellitus (HCC) 06/12/2014    ONSET DATE: 01/14/24 referred   REFERRING DIAG: Acute right ACA stroke   THERAPY DIAG: Aphasia  Cognitive communication deficit  Rationale for Evaluation and Treatment: Rehabilitation  SUBJECTIVE:   SUBJECTIVE STATEMENT: Opening conversation c/b vague speech, perseverations, and circumlocution Pt accompanied by: significant other  PERTINENT HISTORY: history of T2DM- A1c 5.4, GERD, renal calculi, 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 Dr. Buckley felt this was secondary to evolving radiation necrosis v/s novel small vessel infracts and was changed to Eliquis  and abilify   briefly added for ongoing behavorial issues.  He was readmitted on 12/18/23 with recurrent falls with left sided weakness. Repeat MRI brain done revaling new 3.2 X 1.3 cm focus of right basal ganglia infarct  and recent right temporal restricted diffusion no longer present. EEG was negative for seizures and showed cortical dysfunction arising from right fronto-temporal region likely due to underlying structural abnormality. Neurology recommended increasing Lamictal  dose for possible partial seizures.    PAIN:  Are you having pain? No   PLOF:  Level of assistance: Independent with ADLs Employment: On disability  PATIENT GOALS: pt unable to state, spouse desires improved communication, decreased frustration  OBJECTIVE:  Note: Objective measures were completed at Evaluation unless otherwise noted.  Inpatient Rehab ST: Clinical Impression/Discharge Summary:  Pt made slight progress this stay, as evidenced by mastery of 5/5 LTGs. Severe to profound expressive and moderate receptive aphasia remain at this time. He requires cues to clear L buccal cavity and maintain slow rate, but is tolerating D3 textures/thin liquids w/ minA. Frustration tolerance and receptiveness to education/cueing will continue to limit his overall progress, however, recommend cont ST upon d/c to target remaining deficits, maximize pt communication, and maximize pt independence.   COGNITION: Overall cognitive status: Difficulty to assess due to: Communication impairment  AUDITORY COMPREHENSION: Overall auditory comprehension: Impaired: simple, moderately complex, and complex YES/NO questions: Impaired: moderately complex  and complex Following directions: Appears intact, single step. Did not attempt multi-step Conversation: Impaired, Simple Interfering components: attention, processing speed, and working memory Effective technique: extra processing time, repetition/stressing words, slowed speech, and stressing words  READING COMPREHENSION: Impaired: L neglect  EXPRESSION: verbal  VERBAL EXPRESSION: Level of generative/spontaneous verbalization: conversation Automatic speech: name: impaired, social response: impaired, counting: intact, and day of week: impaired  Repetition: Impaired: Word Naming: Confrontation: 0% accuracy Pragmatics: Impaired: abnormal effect, topic appropriateness, topic maintenance, and turn taking Interfering components: auditory comprehension, attention  Effective technique: sentence completion, phonemic cues, and articulatory cues Non-verbal means of communication: none  WRITTEN EXPRESSION: Dominant hand: right Written expression: Impaired: word  MOTOR SPEECH: Overall motor speech: Appears intact  ORAL MOTOR EXAMINATION: Overall status: Impaired:   Lingual: Left (ROM and Symmetry)  STANDARDIZED ASSESSMENTS: QAB severe impairment Word comprehension 10.00  Sentence comprehension 0.83  Word finding 0.00  Grammatical construction 2.00  Speech motor programming 10.00  Repetition 0.00  Reading 7.92  QAB overall 4.02   PATIENT REPORTED OUTCOME MEASURES (PROM): Deferred d/t severity of impairment  TREATMENT DATE:   03/02/24: Mohanad continues to indicate that he does not want his mom to attend ST with him. Targeted multimodal communication and word finding using written words on white board generating ingredients he puts in his meat sauce. With consistent fill in the blank cues, Hilman generated and verbalized 10 ingredients - occasional min phonemic  cues to read each ingredient. He successfully indicated which ingredients he did not use when I provided the written cues. With written cues and semantic cues, he communicated that he went to the grocery store with his mom and was able to select items that he wanted - max A  (written choice of 3 paired with verbal choice) to communicate 2 snack foods he prefers.   02/29/24: Tyreek has been discharged for OT and PT - he is electing to continue ST for his remaining visits. I am in agreement. He elects to defer use of SGD at this time. I will return the device to [employer]. Targeted word finding and multimodal communication generating autumn words - generated 7 words with consistent max semantic and written cues. In conversation re: Halloween costumes, with consistent max A - Koda communicated that he dressed up even as he got older - max A to generate 3 costumes with semantic, questioning and written cues. Targeted simple money and mental math - despite max A, counting bills and change aloud, Langley unable to write amount and indicated that he could not do mental math.   02/17/24: Pt arrives alone - does not have SGD. He was not scheduled to see me but I had a cancellation. Targeted word finding and written expression in personally relevant categories - with fill in the blank cues, Charlene named 6 stores they frequent and wrote them with usual mod cues to correct errors. Due to hand  fatigue, we stopped writing- named his 7 burger toppings and 5 school supplies with fill in the blank cues - after Chrisean read the lists with extended time. In conversation he used verbal compensation of yay to express he was glad to be seen alone today. Attempts to use gestures result unsuccessful despite max A. Instructed him to bring SGD next visit  02/10/24: Targeted training on Touch Talk device - [name] added 10 icons, including folders for places and biographical information. Derward selected icons to consistent  written and verbal cues with cues to scan left, 8/10x with extended time. Targeted written expression while [name] was customizing device - he wrote family names with occasional min A to ID errors and consistent copy cues to correct errors wrote 4/5 family names correctly, 3 fruits and 3 tools, again with copy cues required to correct errors.  02/08/24: Introduced Touch Talk AAC device - demonstrated how to add icon and folder, use the photo option. 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 She is aware that she will need to initiate use of Touch Talk. Barbara had multiple episodes of becoming belligerent and agitated shaking his fists and yelling out. [name] reports this happens throughout the day and he has lashed out physically a few times, hitting her arm. He is up at night, waking her without awareness. Overall awareness, attention and decision making severely impaired. Encouraged him to try Seroquel  as prescribed by his MD. Tinnie reports he refuses to take new meds, today he agreed to try this med tonight. Psych consult pending.   02/02/24: Utilize interest and then tolerate to generate list of salient topics for future SGD customization.  Patient identifies  the following: Eating out, family, food and meals, movies and TV, news and current events, personal needs, pets, sports, conflict.  For food and meal patient would benefit from folder dedicated to getting instruction to wife for preparation of preferred meals, including ingredients.  For support patient would benefit from folder for conversation with kids regarding their sports involvement including soccer T-ball and basketball.  Utilizing [facility] board and writing as multimodal communication patient correctly answered 4 of 5 personal questions.  ABC board provided for use at home as patient continues to benefit from first letter cues for anomia repair.   01/26/24: Given significant aphasia, trialed multimodal communication today, including  writing and [employer] TouchTalk device. Pt able to write wife's name with min error to augment naming. Otherwise, writing inconsistent with perseverations and agraphia. Demonstrated how to functionally utilize TouchTalk device for naming preferred foods items. Good oral reading accuracy exhibited. Able to ID preferred choice f/o 3 with good accuracy. Generated AAC categories related to his children. Required usual max A to name interests of children. Cued gestures were beneficial x2. Pt and wife interested in pursuing SGD trials, with SLP submitting information this session.  01/21/24: Explained rationale for speech generating device recommendation. Discussed benefits of increased independence with communication and to support rehabilitation. Discussed severity of impairment and prior level of functioning supporting likelihood of slow rehab course, and benefit of SGD for improved communication while rehabilitating. Pt does not desire AAC at this time   PATIENT EDUCATION: Education details: POC/goals Person educated: Patient and Spouse Education method: Explanation Education comprehension: verbalized understanding and needs further education   GOALS: Goals reviewed with patient? Yes  SHORT TERM GOALS: Target date: 03/17/2024  Patient will use multimodal communication to communicate basic wants and needs with rare min-A Baseline: Goal status: ONGOING  2.  Patient and caregivers will generate x10 functional home-based targets for low or high tech AAC system  Baseline:  Goal status: ONGOING  3.  Patient will complete word level structured language tasks with mod-A  Baseline:  Goal status: ONGOING  4.  Pt will demonstrate accurate gesture for given target with 80% accuracy  Baseline:  Goal status: ONGOING   LONG TERM GOALS: Target date: 04/14/2024  Pt will use multimodal communication to communicate preferences in simple conversation  Baseline:  Goal status: ONGOING  2.  Pt and  caregivers will carryover 3 compensatory strategies to support pt's auditory comprehension  Baseline:  Goal status: ONGOING  3.  Pt and caregiver will demonstrate supportive communication techniques in conversation to support pt expressive language Baseline:  Goal status: ONGOING  4.  Pt and spouse will carryover 1-2 language enhancing activities at home daily 5/7 days  Baseline:  Goal status: ONGOING  5.  Pt will generate moderately complex sentences in structured task such as VNeST with usual min A  Baseline:  Goal status: ONGOING   ASSESSMENT:  CLINICAL IMPRESSION: Patient is a 53 y.o. M who was seen today for aphasia tx. Known to clinic from prior ST course, since has had another stroke. Communication impairment during prior ST course was severe, profound impairment at this time. Pt presenting with fluent aphsia c/b nonsensical speech that lacks meaning, tangential speech, usual use of a few rote phrases repetitively (the thing, the thing, that which is). During picture description task, pt says that, that, that with no content.  Benefit observed from phonemic and phrase completion cues to aid in anomia recovery.  Patient unable to repeat, challenges with answering yes no  questions.  Per spouse primary means of communication was asking yes/no questions prior to most recent stroke, this new impairment has significantly impacted her communication and skills in the home environment.  Patient appears to lack awareness, he typically responds to questions with a long-winded nonsensical speech requiring SLP cues to stop.  Due to severity of impairment and prior level of function I highly recommend implementation of a speech generating device.  At this time patient declines will continue to address in future therapy sessions.  Spouse appears receptive to idea of communication support.   OBJECTIVE IMPAIRMENTS: include attention, awareness, executive functioning, and aphasia. These impairments  are limiting patient from effectively communicating at home and in community. Factors affecting potential to achieve goals and functional outcome are previous level of function and severity of impairments. Patient will benefit from skilled SLP services to address above impairments and improve overall function.  REHAB POTENTIAL: Fair (severity of impairments)  PLAN:  SLP FREQUENCY: 2x/week  SLP DURATION: 12 weeks  PLANNED INTERVENTIONS: Language facilitation, Cueing hierachy, Internal/external aids, Functional tasks, Multimodal communication approach, SLP instruction and feedback, Compensatory strategies, and Patient/family education    Mathis Leita Caldron, CCC-SLP 03/02/2024, 1:14 PM

## 2024-03-03 ENCOUNTER — Encounter: Admitting: Occupational Therapy

## 2024-03-03 ENCOUNTER — Ambulatory Visit

## 2024-03-04 ENCOUNTER — Other Ambulatory Visit: Payer: Self-pay

## 2024-03-04 NOTE — Patient Instructions (Signed)
 Visit Information  Thank you for taking time to visit with me today. Please don't hesitate to contact me if I can be of assistance to you before our next scheduled appointment.  Our next appointment is no further scheduled appointments.  Please call the care guide team at [phone] if you need to cancel or reschedule your appointment.   Following is a copy of your care plan:   Goals Addressed   None     Please call the Suicide and Crisis Lifeline: 988 call the [place]  National Suicide Prevention Lifeline: 339-651-0594 or TTY: (314)432-8512 TTY (646)745-5925) to talk to a trained counselor call 1-800-273-TALK (toll free, 24 hour hotline) call 911 if you are experiencing a Mental Health or Behavioral Health Crisis or need someone to talk to.  Patient verbalizes understanding of instructions and care plan provided today and agrees to view in MyChart. Active MyChart status and patient understanding of how to access instructions and care plan via MyChart confirmed with patient.     Thersia Hoar, HEDWIG, MHA [place]  [facility] Social Worker, Population Health [phone]

## 2024-03-04 NOTE — Patient Outreach (Signed)
 Complex Care Management   Visit Note  03/04/2024  Name:  Alan Wise MRN: [id number] DOB: 1970/08/09  Situation: Referral received for Complex Care Management related to SDOH Barriers:  Transportation I obtained verbal consent from Caregiver.  Visit completed with Caregiver  on the phone  Background:   Past Medical History:  Diagnosis Date   Allergy    Complication of anesthesia    pt reports he is starting to have more difficulty arousing after surgery   Diabetes mellitus (HCC)    GERD (gastroesophageal reflux disease)    Headache    History of kidney stones    Hyperlipidemia    Renal disorder    kidney stones    Assessment: SW completed a telephone outreach with patients caregiver/wife, she declines transportation needs at this time. Caregiver states they have no SDOH needs at this time.   SDOH Interventions    Flowsheet Row Patient Outreach Telephone from 02/19/2024 in [place] [facility] Office Visit from 08/19/2023 in [facility] Patient Outreach from 05/26/2023 in [place] POPULATION HEALTH DEPARTMENT Telephone from 05/19/2023 in [facility] POPULATION HEALTH DEPARTMENT Office Visit from 02/16/2023 in [facility] Office Visit from 01/29/2022 in [facility]  SDOH Interventions        Food Insecurity Interventions Intervention Not Indicated -- Intervention Not Indicated Intervention Not Indicated -- --  Housing Interventions -- -- Intervention Not Indicated Intervention Not Indicated -- --  Transportation Interventions Community Resources Provided -- Intervention Not Indicated Patient Resources (Friends/Family), AMB Referral  [He has a seizure and is restricted from driving x 90 days] -- --  Utilities Interventions Intervention Not Indicated -- Intervention Not Indicated Intervention Not Indicated -- --  Depression Interventions/Treatment  -- Patient refuses Treatment --  -- PHQ2-9 Score <4 Follow-up Not Indicated Patient refuses Treatment  Stress Interventions Community Resources Provided, Provide Counseling -- -- -- -- --    Recommendation:   No recommendations at this time  Follow Up Plan:   No follow up at this time  Thersia Hoar, BSW, [facility] [place]  [facility] Social Worker, Population Health [phone]

## 2024-03-07 ENCOUNTER — Ambulatory Visit

## 2024-03-07 ENCOUNTER — Ambulatory Visit: Payer: Self-pay | Admitting: Speech Pathology

## 2024-03-07 ENCOUNTER — Encounter: Payer: Self-pay | Admitting: Speech Pathology

## 2024-03-07 DIAGNOSIS — R4701 Aphasia: Secondary | ICD-10-CM

## 2024-03-07 DIAGNOSIS — R278 Other lack of coordination: Secondary | ICD-10-CM | POA: Diagnosis not present

## 2024-03-07 DIAGNOSIS — R41841 Cognitive communication deficit: Secondary | ICD-10-CM

## 2024-03-07 NOTE — Therapy (Signed)
 OUTPATIENT SPEECH LANGUAGE PATHOLOGY APHASIA TREATMENT   Patient Name: Alan Wise MRN: [id number] DOB:25-Jul-1970, 53 y.o., male Today's Date: 03/07/2024  PCP: Angeline Gearald Stonebraker, NP REFERRING PROVIDER: Toribio Pitch, PA-C  END OF SESSION:  End of Session - 03/07/24 1227     Visit Number 9    Number of Visits 25    Date for SLP Re-Evaluation 04/14/24    Authorization Type [employer]    Progress Note Due on Visit 10    SLP Start Time 1230    SLP Stop Time  1315    SLP Time Calculation (min) 45 min    Activity Tolerance Patient tolerated treatment well            Past Medical History:  Diagnosis Date   Allergy    Complication of anesthesia    pt reports he is starting to have more difficulty arousing after surgery   Diabetes mellitus (HCC)    GERD (gastroesophageal reflux disease)    Headache    History of kidney stones    Hyperlipidemia    Renal disorder    kidney stones   Past Surgical History:  Procedure Laterality Date   APPLICATION OF CRANIAL NAVIGATION Right 01/17/2021   Procedure: APPLICATION OF CRANIAL NAVIGATION;  Surgeon: Debby Dorn MATSU, MD;  Location: [facility] OR;  Service: Neurosurgery;  Laterality: Right;   COLONOSCOPY  09/03/1994   COLONOSCOPY WITH PROPOFOL  N/A 01/07/2023   Procedure: COLONOSCOPY WITH PROPOFOL ;  Surgeon: Jinny Carmine, MD;  Location: [facility] ENDOSCOPY;  Service: Endoscopy;  Laterality: N/A;  NOT TOO EARLY   CYSTOSCOPY WITH STENT PLACEMENT Right 01/25/2016   Procedure: CYSTOSCOPY WITH STENT PLACEMENT;  Surgeon: Redell Lynwood Napoleon, MD;  Location: [facility] ORS;  Service: Urology;  Laterality: Right;   FRAMELESS  BIOPSY WITH BRAINLAB Right 01/17/2021   Procedure: RIGHT STEREOTACTIC BIOPSY OF INSULAR LESION;  Surgeon: Debby Dorn MATSU, MD;  Location: [facility] OR;  Service: Neurosurgery;  Laterality: Right;   KNEE ARTHROSCOPY W/ MENISCAL REPAIR Right 06/23/1988   POLYPECTOMY  01/07/2023   Procedure: POLYPECTOMY;  Surgeon: Jinny Carmine, MD;   Location: [facility] ENDOSCOPY;  Service: Endoscopy;;   removal of birthmark  06/08/2013   SKIN CANCER EXCISION  06/23/2012   TYMPANOSTOMY TUBE PLACEMENT  08/06/1978   URETEROSCOPY WITH HOLMIUM LASER LITHOTRIPSY Right 01/25/2016   Procedure: URETEROSCOPY WITH HOLMIUM LASER LITHOTRIPSY;  Surgeon: Redell Lynwood Napoleon, MD;  Location: [facility] ORS;  Service: Urology;  Laterality: Right;   URETHRAL STRICTURE DILATATION     visual inspection of vocal cord     1973   WISDOM TOOTH EXTRACTION     Patient Active Problem List   Diagnosis Date Noted   Nausea and vomiting 01/07/2024   Malnutrition of moderate degree 01/01/2024   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) 12/30/2023   Acute right ACA stroke (HCC) 12/24/2023   Acute CVA (cerebrovascular accident) (HCC) 12/18/2023   Psychomotor agitation 12/11/2023   Sepsis due to gram-negative UTI (HCC) 12/09/2023   Abnormal brain MRI 12/09/2023   Dyslipidemia 12/09/2023   Dysphagia 09/14/2023   Gait disorder 09/14/2023   Seizure disorder (HCC) 09/10/2023   History of CVA (cerebrovascular accident) 09/09/2023   Essential hypertension 09/09/2023   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   Type 2 diabetes mellitus with hyperlipidemia (HCC) 06/12/2014   Hyperlipidemia associated with type 2 diabetes mellitus (HCC) 06/12/2014    ONSET DATE: 01/14/24 referred   REFERRING DIAG: Acute  right ACA stroke   THERAPY DIAG: Aphasia  Cognitive communication deficit  Rationale for Evaluation and Treatment: Rehabilitation  SUBJECTIVE:   SUBJECTIVE STATEMENT: Opening conversation c/b vague speech, perseverations, and circumlocution Pt accompanied by: significant other  PERTINENT HISTORY: history of T2DM- A1c 5.4, GERD, renal calculi, 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 Dr. Buckley felt this was secondary to evolving radiation necrosis v/s novel small vessel infracts and was changed to Eliquis  and abilify   briefly added for ongoing behavorial issues.  He was readmitted on 12/18/23 with recurrent falls with left sided weakness. Repeat MRI brain done revaling new 3.2 X 1.3 cm focus of right basal ganglia infarct  and recent right temporal restricted diffusion no longer present. EEG was negative for seizures and showed cortical dysfunction arising from right fronto-temporal region likely due to underlying structural abnormality. Neurology recommended increasing Lamictal  dose for possible partial seizures.    PAIN:  Are you having pain? No   PLOF:  Level of assistance: Independent with ADLs Employment: On disability  PATIENT GOALS: pt unable to state, spouse desires improved communication, decreased frustration  OBJECTIVE:  Note: Objective measures were completed at Evaluation unless otherwise noted.  Inpatient Rehab ST: Clinical Impression/Discharge Summary:  Pt made slight progress this stay, as evidenced by mastery of 5/5 LTGs. Severe to profound expressive and moderate receptive aphasia remain at this time. He requires cues to clear L buccal cavity and maintain slow rate, but is tolerating D3 textures/thin liquids w/ minA. Frustration tolerance and receptiveness to education/cueing will continue to limit his overall progress, however, recommend cont ST upon d/c to target remaining deficits, maximize pt communication, and maximize pt independence.   COGNITION: Overall cognitive status: Difficulty to assess due to: Communication impairment  AUDITORY COMPREHENSION: Overall auditory comprehension: Impaired: simple, moderately complex, and complex YES/NO questions:  Impaired: moderately complex and complex Following directions: Appears intact, single step. Did not attempt multi-step Conversation: Impaired, Simple Interfering components: attention, processing speed, and working memory Effective technique: extra processing time, repetition/stressing words, slowed speech, and stressing words  READING COMPREHENSION: Impaired: L neglect  EXPRESSION: verbal  VERBAL EXPRESSION: Level of generative/spontaneous verbalization: conversation Automatic speech: name: impaired, social response: impaired, counting: intact, and day of week: impaired  Repetition: Impaired: Word Naming: Confrontation: 0% accuracy Pragmatics: Impaired: abnormal effect, topic appropriateness, topic maintenance, and turn taking Interfering components: auditory comprehension, attention  Effective technique: sentence completion, phonemic cues, and articulatory cues Non-verbal means of communication: none  WRITTEN EXPRESSION: Dominant hand: right Written expression: Impaired: word  MOTOR SPEECH: Overall motor speech: Appears intact  ORAL MOTOR EXAMINATION: Overall status: Impaired:   Lingual: Left (ROM and Symmetry)  STANDARDIZED ASSESSMENTS: QAB severe impairment Word comprehension 10.00  Sentence comprehension 0.83  Word finding 0.00  Grammatical construction 2.00  Speech motor programming 10.00  Repetition 0.00  Reading 7.92  QAB overall 4.02   PATIENT REPORTED OUTCOME MEASURES (PROM): Deferred d/t severity of impairment  TREATMENT DATE:    03/07/24: Targeted word finding and multi modal communication generating ingredients in his taco recipe and toppings. Dorn required consistent max written cues, written choice of 2-3, phonemic and /or phrase completion cues to name 10 words - he read the list of 10 words with extended time and usual mod phonemic  or phrase completion cues. Generated list of 9 subjects his son will learn in kindergarten again with the same consistent max A, extended time and cues to read list of 9 subjects. Questioning cues required consistently in unstructured conversation due to ongoing empty perseverated speech.  03/02/24: Breccan continues to indicate that he does not want his mom to attend ST with him. Targeted multimodal communication and word finding using written words on white board generating ingredients he puts in his meat sauce. With consistent fill in the blank cues, Sigifredo generated and verbalized 10 ingredients - occasional min phonemic cues to read each ingredient. He successfully indicated which ingredients he did not use when I provided the written cues. With written cues and semantic cues, he communicated that he went to the grocery store with his mom and was able to select items that he wanted - max A  (written choice of 3 paired with verbal choice) to communicate 2 snack foods he prefers.   02/29/24: Maclin has been discharged for OT and PT - he is electing to continue ST for his remaining visits. I am in agreement. He elects to defer use of SGD at this time. I will return the device to [employer]. Targeted word finding and multimodal communication generating autumn words - generated 7 words with consistent max semantic and written cues. In conversation re: Halloween costumes, with consistent max A - Ingvald communicated that he dressed up even as he got older - max A to generate 3 costumes with semantic, questioning and written cues. Targeted simple money and mental math - despite max A, counting bills and change aloud, Jestin unable to write amount and indicated that he could not do mental math.   02/17/24: Pt arrives alone - does not have SGD. He was not scheduled to see me but I had a cancellation. Targeted word finding and written expression in personally relevant categories - with fill in the blank cues,  Danzell named 6 stores they frequent and wrote them with usual mod cues to correct errors. Due to hand  fatigue, we stopped writing- named his 7 burger toppings and 5 school supplies with fill in the blank cues - after Jastin read the lists with extended time. In conversation he used verbal compensation of yay to express he was glad to be seen alone today. Attempts to use gestures result unsuccessful despite max A. Instructed him to bring SGD next visit  02/10/24: Targeted training on Touch Talk device - [name] added 10 icons, including folders for places and biographical information. Sharod selected icons to consistent written and verbal cues with cues to scan left, 8/10x with extended time. Targeted written expression while [name] was customizing device - he wrote family names with occasional min A to ID errors and consistent copy cues to correct errors wrote 4/5 family names correctly, 3 fruits and 3 tools, again with copy cues required to correct errors.  02/08/24: Introduced Touch Talk AAC device - demonstrated how to add icon and folder, use the photo option. 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 She is aware that she will need to initiate use of Touch Talk. Ashdon had multiple  episodes of becoming belligerent and agitated shaking his fists and yelling out. [name] reports this happens throughout the day and he has lashed out physically a few times, hitting her arm. He is up at night, waking her without awareness. Overall awareness, attention and decision making severely impaired. Encouraged him to try Seroquel  as prescribed by his MD. Tinnie reports he refuses to take new meds, today he agreed to try this med tonight. Psych consult pending.   02/02/24: Utilize interest and then tolerate to generate list of salient topics for future SGD customization.  Patient identifies the following: Eating out, family, food and meals, movies and TV, news and current events, personal needs, pets,  sports, conflict.  For food and meal patient would benefit from folder dedicated to getting instruction to wife for preparation of preferred meals, including ingredients.  For support patient would benefit from folder for conversation with kids regarding their sports involvement including soccer T-ball and basketball.  Utilizing [facility] board and writing as multimodal communication patient correctly answered 4 of 5 personal questions.  ABC board provided for use at home as patient continues to benefit from first letter cues for anomia repair.   01/26/24: Given significant aphasia, trialed multimodal communication today, including writing and [employer] TouchTalk device. Pt able to write wife's name with min error to augment naming. Otherwise, writing inconsistent with perseverations and agraphia. Demonstrated how to functionally utilize TouchTalk device for naming preferred foods items. Good oral reading accuracy exhibited. Able to ID preferred choice f/o 3 with good accuracy. Generated AAC categories related to his children. Required usual max A to name interests of children. Cued gestures were beneficial x2. Pt and wife interested in pursuing SGD trials, with SLP submitting information this session.  01/21/24: Explained rationale for speech generating device recommendation. Discussed benefits of increased independence with communication and to support rehabilitation. Discussed severity of impairment and prior level of functioning supporting likelihood of slow rehab course, and benefit of SGD for improved communication while rehabilitating. Pt does not desire AAC at this time   PATIENT EDUCATION: Education details: POC/goals Person educated: Patient and Spouse Education method: Explanation Education comprehension: verbalized understanding and needs further education   GOALS: Goals reviewed with patient? Yes  SHORT TERM GOALS: Target date: 03/17/2024  Patient will use multimodal communication to  communicate basic wants and needs with rare min-A Baseline: Goal status: ONGOING  2.  Patient and caregivers will generate x10 functional home-based targets for low or high tech AAC system  Baseline:  Goal status: ONGOING  3.  Patient will complete word level structured language tasks with mod-A  Baseline:  Goal status: ONGOING  4.  Pt will demonstrate accurate gesture for given target with 80% accuracy  Baseline:  Goal status: ONGOING   LONG TERM GOALS: Target date: 04/14/2024  Pt will use multimodal communication to communicate preferences in simple conversation  Baseline:  Goal status: ONGOING  2.  Pt and caregivers will carryover 3 compensatory strategies to support pt's auditory comprehension  Baseline:  Goal status: ONGOING  3.  Pt and caregiver will demonstrate supportive communication techniques in conversation to support pt expressive language Baseline:  Goal status: ONGOING  4.  Pt and spouse will carryover 1-2 language enhancing activities at home daily 5/7 days  Baseline:  Goal status: ONGOING  5.  Pt will generate moderately complex sentences in structured task such as VNeST with usual min A  Baseline:  Goal status: ONGOING   ASSESSMENT:  CLINICAL IMPRESSION: Patient is a 52  y.o. CHRISTELLA who was seen today for aphasia tx. Known to clinic from prior ST course, since has had another stroke. Communication impairment during prior ST course was severe, profound impairment at this time. Pt presenting with fluent aphsia c/b nonsensical speech that lacks meaning, tangential speech, usual use of a few rote phrases repetitively (the thing, the thing, that which is). During picture description task, pt says that, that, that with no content.  Benefit observed from phonemic and phrase completion cues to aid in anomia recovery.  Patient unable to repeat, challenges with answering yes no questions.  Per spouse primary means of communication was asking yes/no questions prior  to most recent stroke, this new impairment has significantly impacted her communication and skills in the home environment.  Patient appears to lack awareness, he typically responds to questions with a long-winded nonsensical speech requiring SLP cues to stop.  Due to severity of impairment and prior level of function I highly recommend implementation of a speech generating device.  At this time patient declines will continue to address in future therapy sessions.  Spouse appears receptive to idea of communication support.   OBJECTIVE IMPAIRMENTS: include attention, awareness, executive functioning, and aphasia. These impairments are limiting patient from effectively communicating at home and in community. Factors affecting potential to achieve goals and functional outcome are previous level of function and severity of impairments. Patient will benefit from skilled SLP services to address above impairments and improve overall function.  REHAB POTENTIAL: Fair (severity of impairments)  PLAN:  SLP FREQUENCY: 2x/week  SLP DURATION: 12 weeks  PLANNED INTERVENTIONS: Language facilitation, Cueing hierachy, Internal/external aids, Functional tasks, Multimodal communication approach, SLP instruction and feedback, Compensatory strategies, and Patient/family education    Mathis Leita Caldron, CCC-SLP 03/07/2024, 1:13 PM

## 2024-03-09 ENCOUNTER — Encounter: Payer: Self-pay | Admitting: Speech Pathology

## 2024-03-09 ENCOUNTER — Other Ambulatory Visit: Payer: Self-pay | Admitting: *Deleted

## 2024-03-09 ENCOUNTER — Ambulatory Visit: Payer: Self-pay | Admitting: Speech Pathology

## 2024-03-09 DIAGNOSIS — R41841 Cognitive communication deficit: Secondary | ICD-10-CM

## 2024-03-09 DIAGNOSIS — R4701 Aphasia: Secondary | ICD-10-CM

## 2024-03-09 DIAGNOSIS — R278 Other lack of coordination: Secondary | ICD-10-CM | POA: Diagnosis not present

## 2024-03-09 NOTE — Patient Instructions (Signed)
 Alan Wise - I am sorry I was unable to reach you today for our scheduled appointment. I work with Antonette Angeline ORN, NP and am calling to support your healthcare needs. Please contact me at [phone] at your earliest convenience. I look forward to speaking with you soon.   Thank you,     Hadasa Gasner, LCSW [place]  [facility], [facility] Licensed Clinical Social Worker  Direct Dial: [phone]

## 2024-03-09 NOTE — Therapy (Signed)
 OUTPATIENT SPEECH LANGUAGE PATHOLOGY APHASIA TREATMENT   Patient Name: Alan Wise MRN: [id number] DOB:1971/04/21, 53 y.o., male Today's Date: 03/09/2024  PCP: Angeline Jacque Byron, NP REFERRING PROVIDER: Toribio Pitch, PA-C  END OF SESSION:  End of Session - 03/09/24 1238     Visit Number 10    Number of Visits 25    Date for SLP Re-Evaluation 04/14/24    Authorization Type [employer]    Progress Note Due on Visit 10    SLP Start Time 1233    SLP Stop Time  1315    SLP Time Calculation (min) 42 min    Activity Tolerance Patient tolerated treatment well            Past Medical History:  Diagnosis Date   Allergy    Complication of anesthesia    pt reports he is starting to have more difficulty arousing after surgery   Diabetes mellitus (HCC)    GERD (gastroesophageal reflux disease)    Headache    History of kidney stones    Hyperlipidemia    Renal disorder    kidney stones   Past Surgical History:  Procedure Laterality Date   APPLICATION OF CRANIAL NAVIGATION Right 01/17/2021   Procedure: APPLICATION OF CRANIAL NAVIGATION;  Surgeon: Debby Dorn MATSU, MD;  Location: [facility] OR;  Service: Neurosurgery;  Laterality: Right;   COLONOSCOPY  09/03/1994   COLONOSCOPY WITH PROPOFOL  N/A 01/07/2023   Procedure: COLONOSCOPY WITH PROPOFOL ;  Surgeon: Jinny Carmine, MD;  Location: [facility] ENDOSCOPY;  Service: Endoscopy;  Laterality: N/A;  NOT TOO EARLY   CYSTOSCOPY WITH STENT PLACEMENT Right 01/25/2016   Procedure: CYSTOSCOPY WITH STENT PLACEMENT;  Surgeon: Redell Lynwood Napoleon, MD;  Location: [facility] ORS;  Service: Urology;  Laterality: Right;   FRAMELESS  BIOPSY WITH BRAINLAB Right 01/17/2021   Procedure: RIGHT STEREOTACTIC BIOPSY OF INSULAR LESION;  Surgeon: Debby Dorn MATSU, MD;  Location: [facility] OR;  Service: Neurosurgery;  Laterality: Right;   KNEE ARTHROSCOPY W/ MENISCAL REPAIR Right 06/23/1988   POLYPECTOMY  01/07/2023   Procedure: POLYPECTOMY;  Surgeon: Jinny Carmine, MD;   Location: [facility] ENDOSCOPY;  Service: Endoscopy;;   removal of birthmark  06/08/2013   SKIN CANCER EXCISION  06/23/2012   TYMPANOSTOMY TUBE PLACEMENT  08/06/1978   URETEROSCOPY WITH HOLMIUM LASER LITHOTRIPSY Right 01/25/2016   Procedure: URETEROSCOPY WITH HOLMIUM LASER LITHOTRIPSY;  Surgeon: Redell Lynwood Napoleon, MD;  Location: [facility] ORS;  Service: Urology;  Laterality: Right;   URETHRAL STRICTURE DILATATION     visual inspection of vocal cord     1973   WISDOM TOOTH EXTRACTION     Patient Active Problem List   Diagnosis Date Noted   Nausea and vomiting 01/07/2024   Malnutrition of moderate degree 01/01/2024   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) 12/30/2023   Acute right ACA stroke (HCC) 12/24/2023   Acute CVA (cerebrovascular accident) (HCC) 12/18/2023   Psychomotor agitation 12/11/2023   Sepsis due to gram-negative UTI (HCC) 12/09/2023   Abnormal brain MRI 12/09/2023   Dyslipidemia 12/09/2023   Dysphagia 09/14/2023   Gait disorder 09/14/2023   Seizure disorder (HCC) 09/10/2023   History of CVA (cerebrovascular accident) 09/09/2023   Essential hypertension 09/09/2023   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   Type 2 diabetes mellitus with hyperlipidemia (HCC) 06/12/2014   Hyperlipidemia associated with type 2 diabetes mellitus (HCC) 06/12/2014    ONSET DATE: 01/14/24 referred   REFERRING DIAG: Acute  right ACA stroke   THERAPY DIAG: Aphasia  Cognitive communication deficit  Rationale for Evaluation and Treatment: Rehabilitation  SUBJECTIVE:   SUBJECTIVE STATEMENT: Opening conversation c/b vague speech, perseverations, and circumlocution Pt accompanied by: significant other  PERTINENT HISTORY: history of T2DM- A1c 5.4, GERD, renal calculi, 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 12/08/23 with 4 day hx of AMS with increased speech difficulty, difficulty walking. Dr. Buckley felt this was secondary to evolving radiation necrosis v/s novel small vessel infracts and was changed to Eliquis  and abilify   briefly added for ongoing behavorial issues.  He was readmitted on 12/18/23 with recurrent falls with left sided weakness. Repeat MRI brain done revaling new 3.2 X 1.3 cm focus of right basal ganglia infarct  and recent right temporal restricted diffusion no longer present. EEG was negative for seizures and showed cortical dysfunction arising from right fronto-temporal region likely due to underlying structural abnormality. Neurology recommended increasing Lamictal  dose for possible partial seizures.    PAIN:  Are you having pain? No   PLOF:  Level of assistance: Independent with ADLs Employment: On disability  PATIENT GOALS: pt unable to state, spouse desires improved communication, decreased frustration  OBJECTIVE:  Note: Objective measures were completed at Evaluation unless otherwise noted.  Inpatient Rehab ST: Clinical Impression/Discharge Summary:  Pt made slight progress this stay, as evidenced by mastery of 5/5 LTGs. Severe to profound expressive and moderate receptive aphasia remain at this time. He requires cues to clear L buccal cavity and maintain slow rate, but is tolerating D3 textures/thin liquids w/ minA. Frustration tolerance and receptiveness to education/cueing will continue to limit his overall progress, however, recommend cont ST upon d/c to target remaining deficits, maximize pt communication, and maximize pt independence.   COGNITION: Overall cognitive status: Difficulty to assess due to: Communication impairment  AUDITORY COMPREHENSION: Overall auditory comprehension: Impaired: simple, moderately complex, and complex YES/NO questions:  Impaired: moderately complex and complex Following directions: Appears intact, single step. Did not attempt multi-step Conversation: Impaired, Simple Interfering components: attention, processing speed, and working memory Effective technique: extra processing time, repetition/stressing words, slowed speech, and stressing words  READING COMPREHENSION: Impaired: L neglect  EXPRESSION: verbal  VERBAL EXPRESSION: Level of generative/spontaneous verbalization: conversation Automatic speech: name: impaired, social response: impaired, counting: intact, and day of week: impaired  Repetition: Impaired: Word Naming: Confrontation: 0% accuracy Pragmatics: Impaired: abnormal effect, topic appropriateness, topic maintenance, and turn taking Interfering components: auditory comprehension, attention  Effective technique: sentence completion, phonemic cues, and articulatory cues Non-verbal means of communication: none  WRITTEN EXPRESSION: Dominant hand: right Written expression: Impaired: word  MOTOR SPEECH: Overall motor speech: Appears intact  ORAL MOTOR EXAMINATION: Overall status: Impaired:   Lingual: Left (ROM and Symmetry)  STANDARDIZED ASSESSMENTS: QAB severe impairment Word comprehension 10.00  Sentence comprehension 0.83  Word finding 0.00  Grammatical construction 2.00  Speech motor programming 10.00  Repetition 0.00  Reading 7.92  QAB overall 4.02   PATIENT REPORTED OUTCOME MEASURES (PROM): Deferred d/t severity of impairment  TREATMENT DATE:   03/09/24: Kerney indicates he would like more activity during the day - he is unsure of is long term plan of staying with his mom. He continues to report difficulty communicating wants and needs with his mom. Unclear if he is trying to use any writing of letters to augment communication with mom at home. Targeted  written word for multimodal communication. Zimir required consistent max copy cues to write names of 5 basic objects - writing trials limited due to UE fatigue and weakness. In conversation, I used written key words to successfully cue Mckale for family names and topic changes with rare min A. Generated list of places he can go with max A - wrote park and [place] was able to verbalize [place]osh on the [facility] as a park he can visit.   03/07/24: Targeted word finding and multi modal communication generating ingredients in his taco recipe and toppings. Dorn required consistent max written cues, written choice of 2-3, phonemic and /or phrase completion cues to name 10 words - he read the list of 10 words with extended time and usual mod phonemic or phrase completion cues. Generated list of 9 subjects his son will learn in kindergarten again with the same consistent max A, extended time and cues to read list of 9 subjects. Questioning cues required consistently in unstructured conversation due to ongoing empty perseverated speech.  03/02/24: Mase continues to indicate that he does not want his mom to attend ST with him. Targeted multimodal communication and word finding using written words on white board generating ingredients he puts in his meat sauce. With consistent fill in the blank cues, Champ generated and verbalized 10 ingredients - occasional min phonemic cues to read each ingredient. He successfully indicated which ingredients he did not use when I provided the written cues. With written cues and semantic cues, he communicated that he went to the grocery store with his mom and was able to select items that he wanted - max A  (written choice of 3 paired with verbal choice) to communicate 2 snack foods he prefers.   02/29/24: Kalee has been discharged for OT and PT - he is electing to continue ST for his remaining visits. I am in agreement. He elects to defer use of SGD at this time. I will  return the device to [employer]. Targeted word finding and multimodal communication generating autumn words - generated 7 words with consistent max semantic and written cues. In conversation re: Halloween costumes, with consistent max A - Welton communicated that he dressed up even as he got older - max A to generate 3 costumes with semantic, questioning and written cues. Targeted simple money and mental math - despite max A, counting bills and change aloud, Dontae unable to write amount and indicated that he could not do mental math.   02/17/24: Pt arrives alone - does not have SGD. He was not scheduled to see me but I had a cancellation. Targeted word finding and written expression in personally relevant categories - with fill in the blank cues, Less named 6 stores they frequent and wrote them with usual mod cues to correct errors. Due to hand  fatigue, we stopped writing- named his 7 burger toppings and 5 school supplies with fill in the blank cues - after Jaceon read the lists with extended time. In conversation he used verbal compensation of yay to express he was glad to be seen alone today. Attempts to use gestures result unsuccessful despite max A.  Instructed him to bring SGD next visit  02/10/24: Targeted training on Touch Talk device - [name] added 10 icons, including folders for places and biographical information. Rashun selected icons to consistent written and verbal cues with cues to scan left, 8/10x with extended time. Targeted written expression while [name] was customizing device - he wrote family names with occasional min A to ID errors and consistent copy cues to correct errors wrote 4/5 family names correctly, 3 fruits and 3 tools, again with copy cues required to correct errors.  02/08/24: Introduced Touch Talk AAC device - demonstrated how to add icon and folder, use the photo option. 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 She is aware that she will need to initiate  use of Touch Talk. Slayton had multiple episodes of becoming belligerent and agitated shaking his fists and yelling out. [name] reports this happens throughout the day and he has lashed out physically a few times, hitting her arm. He is up at night, waking her without awareness. Overall awareness, attention and decision making severely impaired. Encouraged him to try Seroquel  as prescribed by his MD. Tinnie reports he refuses to take new meds, today he agreed to try this med tonight. Psych consult pending.   02/02/24: Utilize interest and then tolerate to generate list of salient topics for future SGD customization.  Patient identifies the following: Eating out, family, food and meals, movies and TV, news and current events, personal needs, pets, sports, conflict.  For food and meal patient would benefit from folder dedicated to getting instruction to wife for preparation of preferred meals, including ingredients.  For support patient would benefit from folder for conversation with kids regarding their sports involvement including soccer T-ball and basketball.  Utilizing [facility] board and writing as multimodal communication patient correctly answered 4 of 5 personal questions.  ABC board provided for use at home as patient continues to benefit from first letter cues for anomia repair.   01/26/24: Given significant aphasia, trialed multimodal communication today, including writing and [employer] TouchTalk device. Pt able to write wife's name with min error to augment naming. Otherwise, writing inconsistent with perseverations and agraphia. Demonstrated how to functionally utilize TouchTalk device for naming preferred foods items. Good oral reading accuracy exhibited. Able to ID preferred choice f/o 3 with good accuracy. Generated AAC categories related to his children. Required usual max A to name interests of children. Cued gestures were beneficial x2. Pt and wife interested in pursuing SGD trials, with SLP  submitting information this session.  01/21/24: Explained rationale for speech generating device recommendation. Discussed benefits of increased independence with communication and to support rehabilitation. Discussed severity of impairment and prior level of functioning supporting likelihood of slow rehab course, and benefit of SGD for improved communication while rehabilitating. Pt does not desire AAC at this time   PATIENT EDUCATION: Education details: POC/goals Person educated: Patient and Spouse Education method: Explanation Education comprehension: verbalized understanding and needs further education   GOALS: Goals reviewed with patient? Yes  SHORT TERM GOALS: Target date: 03/17/2024  Patient will use multimodal communication to communicate basic wants and needs with rare min-A Baseline: Goal status: ONGOING  2.  Patient and caregivers will generate x10 functional home-based targets for low or high tech AAC system  Baseline:  Goal status: ONGOING  3.  Patient will complete word level structured language tasks with mod-A  Baseline:  Goal status: ONGOING  4.  Pt will demonstrate accurate gesture for given target with 80% accuracy  Baseline:  Goal status: ONGOING   LONG TERM GOALS: Target date: 04/14/2024  Pt will use multimodal communication to communicate preferences in simple conversation  Baseline:  Goal status: ONGOING  2.  Pt and caregivers will carryover 3 compensatory strategies to support pt's auditory comprehension  Baseline:  Goal status: ONGOING  3.  Pt and caregiver will demonstrate supportive communication techniques in conversation to support pt expressive language Baseline:  Goal status: ONGOING  4.  Pt and spouse will carryover 1-2 language enhancing activities at home daily 5/7 days  Baseline:  Goal status: ONGOING  5.  Pt will generate moderately complex sentences in structured task such as VNeST with usual min A  Baseline:  Goal status:  ONGOING   ASSESSMENT:  CLINICAL IMPRESSION: Patient is a 53 y.o. M who was seen today for aphasia tx. Known to clinic from prior ST course, since has had another stroke. Communication impairment during prior ST course was severe, profound impairment at this time. Pt presenting with fluent aphsia c/b nonsensical speech that lacks meaning, tangential speech, usual use of a few rote phrases repetitively (the thing, the thing, that which is). During picture description task, pt says that, that, that with no content.  Benefit observed from phonemic and phrase completion cues to aid in anomia recovery.  Patient unable to repeat, challenges with answering yes no questions.  Per spouse primary means of communication was asking yes/no questions prior to most recent stroke, this new impairment has significantly impacted her communication and skills in the home environment.  Patient appears to lack awareness, he typically responds to questions with a long-winded nonsensical speech requiring SLP cues to stop.  Due to severity of impairment and prior level of function I highly recommend implementation of a speech generating device.  At this time patient declines will continue to address in future therapy sessions.  Spouse appears receptive to idea of communication support.   OBJECTIVE IMPAIRMENTS: include attention, awareness, executive functioning, and aphasia. These impairments are limiting patient from effectively communicating at home and in community. Factors affecting potential to achieve goals and functional outcome are previous level of function and severity of impairments. Patient will benefit from skilled SLP services to address above impairments and improve overall function.  REHAB POTENTIAL: Fair (severity of impairments)  PLAN:  SLP FREQUENCY: 2x/week  SLP DURATION: 12 weeks  PLANNED INTERVENTIONS: Language facilitation, Cueing hierachy, Internal/external aids, Functional tasks, Multimodal  communication approach, SLP instruction and feedback, Compensatory strategies, and Patient/family education    Mathis Leita Caldron, CCC-SLP 03/09/2024, 1:16 PM

## 2024-03-11 ENCOUNTER — Other Ambulatory Visit: Payer: Self-pay | Admitting: *Deleted

## 2024-03-11 NOTE — Patient Outreach (Signed)
 Complex Care Management   Visit Note  03/11/2024  Name:  Alan Wise MRN: [id number] DOB: March 30, 1971  Situation: Referral received for Complex Care Management related to SDOH Barriers:  Transportation Caregiver strain. I obtained verbal consent from POA/spouse.  Visit completed with POA  on the phone Background:   Past Medical History:  Diagnosis Date   Allergy    Complication of anesthesia    pt reports he is starting to have more difficulty arousing after surgery   Diabetes mellitus (HCC)    GERD (gastroesophageal reflux disease)    Headache    History of kidney stones    Hyperlipidemia    Renal disorder    kidney stones    Assessment: Patient Reported Symptoms:  Cognitive Cognitive Status: Able to follow simple commands, Difficulties with attention and concentration, Requires Assistance Decision Making Other: Brain injury from brain tumor and  stroke, cognitive deficits   Health Maintenance Behaviors: Annual physical exam Healing Pattern: Slow Health Facilitated by: Rest  Neurological Neurological Review of Symptoms: Weakness (weakness in left and right arm) Neurological Management Strategies: Medication therapy, Routine screening, Coping strategies Neurological Comment: seizure disorder  HEENT HEENT Symptoms Reported: Change or loss of hearing (left eye-very limited due to necrosis in brain)      Cardiovascular Cardiovascular Symptoms Reported: No symptoms reported    Respiratory Respiratory Symptoms Reported: No symptoms reported    Endocrine Endocrine Symptoms Reported: No symptoms reported Is patient diabetic?: Yes (pre-diabetic now) Is patient checking blood sugars at home?: No    Gastrointestinal Gastrointestinal Symptoms Reported: No symptoms reported      Genitourinary Genitourinary Symptoms Reported: No symptoms reported    Integumentary Integumentary Symptoms Reported: No symptoms reported    Musculoskeletal Additional Musculoskeletal Details:  Per spouse left foot drags, especialyly when tired-no longer in PT , OT Musculoskeletal Management Strategies: Adequate rest, Routine screening Musculoskeletal Self-Management Outcome: 3 (uncertain)      Psychosocial Psychosocial Symptoms Reported: Irritability Behavioral Management Strategies: Adequate rest, Medication therapy Major Change/Loss/Stressor/Fears (CP): Medical condition, self Techniques to Cope with Loss/Stress/Change: Medication Quality of Family Relationships: involved, helpful Do you feel physically threatened by others?: No    03/11/2024    PHQ2-9 Depression Screening   Little interest or pleasure in doing things Not at all  Feeling down, depressed, or hopeless Several days  PHQ-2 - Total Score 1  Trouble falling or staying asleep, or sleeping too much    Feeling tired or having little energy    Poor appetite or overeating     Feeling bad about yourself - or that you are a failure or have let yourself or your family down    Trouble concentrating on things, such as reading the newspaper or watching television    Moving or speaking so slowly that other people could have noticed.  Or the opposite - being so fidgety or restless that you have been moving around a lot more than usual    Thoughts that you would be better off dead, or hurting yourself in some way    PHQ2-9 Total Score    If you checked off any problems, how difficult have these problems made it for you to do your work, take care of things at home, or get along with other people    Depression Interventions/Treatment      There were no vitals filed for this visit.  Medications Reviewed Today     Reviewed by Ermalinda Lenn HERO, LCSW (Social Worker) on 03/11/24 at [phone]  Med List Status: <None>   Medication Order Taking? Sig Documenting Provider Last Dose Status Informant  acetaminophen  (TYLENOL ) 325 MG tablet [id number] Yes Take 1-2 tablets (325-650 mg total) by mouth every 4 (four) hours as needed for mild  pain (pain score 1-3). [name], PA-C  Active   amLODipine  (NORVASC ) 5 MG tablet [id number] Yes TAKE 1 TABLET (5 MG TOTAL) BY MOUTH DAILY. Vaslow, Zachary K, MD  Active   apixaban  (ELIQUIS ) 5 MG TABS tablet [id number] Yes Take 1 tablet (5 mg total) by mouth 2 (two) times daily. [name], PA-C  Active   atorvastatin  (LIPITOR) 40 MG tablet [id number] Yes Take 1 tablet (40 mg total) by mouth daily with supper. [name], PA-C  Active   feeding supplement (ENSURE PLUS HIGH PROTEIN) LIQD [id number] Yes Take 237 mLs by mouth 3 (three) times daily between meals. Love, Pamela S, PA-C  Active   lamoTRIgine  (LAMICTAL ) 150 MG tablet [id number] Yes TAKE 1 TABLET BY MOUTH TWICE A DAY Vaslow, Zachary K, MD  Active   Multiple Vitamin (MULTIVITAMIN WITH MINERALS) TABS tablet [id number]  Take 1 tablet by mouth daily.  Patient not taking: Reported on 03/11/2024   [name], PA-C  Active   ondansetron  (ZOFRAN -ODT) 8 MG disintegrating tablet [id number]  Take 1 tablet (8 mg total) by mouth every 8 (eight) hours.  Patient not taking: Reported on 03/11/2024   [name], PA-C  Active   polyethylene glycol powder (GLYCOLAX /MIRALAX ) 17 GM/SCOOP powder [id number] Yes Take 1 capful (17 g) by mouth daily. [name], PA-C  Active   prochlorperazine  (COMPAZINE ) 5 MG tablet [id number] Yes Take 1 tablet (5 mg total) by mouth every 8 (eight) hours as needed for refractory nausea / vomiting. [name], PA-C  Active   propranolol  (INDERAL ) 20 MG tablet [id number] Yes Take 1 tablet (20 mg total) by mouth 2 (two) times daily. [name], PA-C  Active   QUEtiapine  (SEROQUEL ) 25 MG tablet [id number] Yes Take 1 tablet (25 mg total) by mouth 2 (two) times daily. Vaslow, Zachary K, MD  Active   senna-docusate (SENOKOT-S) 8.6-50 MG tablet [id number] Yes Take 2 tablets by mouth daily with supper. [name], PA-C  Active             Recommendation:   PCP Follow-up Specialty provider follow-up as  scheduled  Follow Up Plan:   Patient has met all care management goals. Care Management case will be closed. Patient has been provided contact information should new needs arise.   Polly Barner, LCSW [place]  [facility], [facility] Licensed Clinical Social Worker  Direct Dial: [phone]

## 2024-03-11 NOTE — Patient Instructions (Signed)
 Visit Information  Thank you for taking time to visit with me today. Please don't hesitate to contact me if I can be of assistance to you before our next scheduled appointment.  There are no further scheduled appointments.     Please call the care guide team at [phone] if you need to cancel, schedule, or reschedule an appointment.   Please call the Suicide and Crisis Lifeline: 988 call the [place]  National Suicide Prevention Lifeline: 209-099-8696 or TTY: 903-628-0789 TTY 780-168-6250) to talk to a trained counselor call 1-800-273-TALK (toll free, 24 hour hotline) if you are experiencing a Mental Health or Behavioral Health Crisis or need someone to talk to.  Lekeisha Arenas, LCSW [place]  [facility], [facility] Licensed Clinical Social Worker  Direct Dial: [phone]

## 2024-03-15 ENCOUNTER — Ambulatory Visit

## 2024-03-21 ENCOUNTER — Encounter: Payer: Self-pay | Admitting: Speech Pathology

## 2024-03-21 ENCOUNTER — Ambulatory Visit

## 2024-03-21 ENCOUNTER — Ambulatory Visit: Admitting: Speech Pathology

## 2024-03-21 DIAGNOSIS — R4701 Aphasia: Secondary | ICD-10-CM

## 2024-03-21 DIAGNOSIS — R278 Other lack of coordination: Secondary | ICD-10-CM | POA: Diagnosis not present

## 2024-03-21 DIAGNOSIS — R41841 Cognitive communication deficit: Secondary | ICD-10-CM

## 2024-03-21 NOTE — Patient Instructions (Signed)
   Checkers Chief Technology Officer 4 [employer] games Jig saw puzzles (100 piece)  Audiological scientist Board games Domino[employer] Mahjong Adult coloring book (with right hand) Drawing Star Wars photo books Learn a new game!  Plan a menu Participate in household chores and decisions (with supervision)

## 2024-03-21 NOTE — Therapy (Signed)
 OUTPATIENT SPEECH LANGUAGE PATHOLOGY APHASIA TREATMENT   Patient Name: Alan Wise MRN: [id number] DOB:1970/12/25, 53 y.o., male Today's Date: 03/21/2024  PCP: Angeline Teasia Zapf, NP REFERRING PROVIDER: Toribio Pitch, PA-C  END OF SESSION:  End of Session - 03/21/24 1020     Visit Number 11    Number of Visits 25    Date for Recertification  04/14/24    Authorization Type [employer]    SLP Start Time 1020    SLP Stop Time  1100    SLP Time Calculation (min) 40 min    Activity Tolerance Patient tolerated treatment well            Past Medical History:  Diagnosis Date   Allergy    Complication of anesthesia    pt reports he is starting to have more difficulty arousing after surgery   Diabetes mellitus (HCC)    GERD (gastroesophageal reflux disease)    Headache    History of kidney stones    Hyperlipidemia    Renal disorder    kidney stones   Past Surgical History:  Procedure Laterality Date   APPLICATION OF CRANIAL NAVIGATION Right 01/17/2021   Procedure: APPLICATION OF CRANIAL NAVIGATION;  Surgeon: Debby Dorn MATSU, MD;  Location: [facility] OR;  Service: Neurosurgery;  Laterality: Right;   COLONOSCOPY  09/03/1994   COLONOSCOPY WITH PROPOFOL  N/A 01/07/2023   Procedure: COLONOSCOPY WITH PROPOFOL ;  Surgeon: Jinny Carmine, MD;  Location: [facility] ENDOSCOPY;  Service: Endoscopy;  Laterality: N/A;  NOT TOO EARLY   CYSTOSCOPY WITH STENT PLACEMENT Right 01/25/2016   Procedure: CYSTOSCOPY WITH STENT PLACEMENT;  Surgeon: Redell Lynwood Napoleon, MD;  Location: [facility] ORS;  Service: Urology;  Laterality: Right;   FRAMELESS  BIOPSY WITH BRAINLAB Right 01/17/2021   Procedure: RIGHT STEREOTACTIC BIOPSY OF INSULAR LESION;  Surgeon: Debby Dorn MATSU, MD;  Location: [facility] OR;  Service: Neurosurgery;  Laterality: Right;   KNEE ARTHROSCOPY W/ MENISCAL REPAIR Right 06/23/1988   POLYPECTOMY  01/07/2023   Procedure: POLYPECTOMY;  Surgeon: Jinny Carmine, MD;  Location: [facility] ENDOSCOPY;   Service: Endoscopy;;   removal of birthmark  06/08/2013   SKIN CANCER EXCISION  06/23/2012   TYMPANOSTOMY TUBE PLACEMENT  08/06/1978   URETEROSCOPY WITH HOLMIUM LASER LITHOTRIPSY Right 01/25/2016   Procedure: URETEROSCOPY WITH HOLMIUM LASER LITHOTRIPSY;  Surgeon: Redell Lynwood Napoleon, MD;  Location: [facility] ORS;  Service: Urology;  Laterality: Right;   URETHRAL STRICTURE DILATATION     visual inspection of vocal cord     1973   WISDOM TOOTH EXTRACTION     Patient Active Problem List   Diagnosis Date Noted   Nausea and vomiting 01/07/2024   Malnutrition of moderate degree 01/01/2024   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) 12/30/2023   Acute right ACA stroke (HCC) 12/24/2023   Acute CVA (cerebrovascular accident) (HCC) 12/18/2023   Psychomotor agitation 12/11/2023   Sepsis due to gram-negative UTI (HCC) 12/09/2023   Abnormal brain MRI 12/09/2023   Dyslipidemia 12/09/2023   Dysphagia 09/14/2023   Gait disorder 09/14/2023   Seizure disorder (HCC) 09/10/2023   History of CVA (cerebrovascular accident) 09/09/2023   Essential hypertension 09/09/2023   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   Type 2 diabetes mellitus with hyperlipidemia (HCC) 06/12/2014   Hyperlipidemia associated with type 2 diabetes mellitus (HCC) 06/12/2014    ONSET DATE: 01/14/24 referred   REFERRING DIAG: Acute right ACA stroke   THERAPY DIAG: Aphasia  Cognitive communication deficit  Rationale for Evaluation and Treatment: Rehabilitation  SUBJECTIVE:   SUBJECTIVE STATEMENT: I am um u m that which is..um Pt accompanied by: significant other  PERTINENT HISTORY: history of T2DM- A1c 5.4, GERD, renal calculi, 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 Dr. Buckley felt this was secondary to evolving radiation necrosis v/s novel small vessel infracts and was changed to Eliquis  and abilify   briefly added for ongoing behavorial issues.  He was readmitted on 12/18/23 with recurrent falls with left sided weakness. Repeat MRI brain done revaling new 3.2 X 1.3 cm focus of right basal ganglia infarct  and recent right temporal restricted diffusion no longer present. EEG was negative for seizures and showed cortical dysfunction arising from right fronto-temporal region likely due to underlying structural abnormality. Neurology recommended increasing Lamictal  dose for possible partial seizures.    PAIN:  Are you having pain? No   PLOF:  Level of assistance: Independent with ADLs Employment: On disability  PATIENT GOALS: pt unable to state, spouse desires improved communication, decreased frustration  OBJECTIVE:  Note: Objective measures were completed at Evaluation unless otherwise noted.  Inpatient Rehab ST: Clinical Impression/Discharge Summary:  Pt made slight progress this stay, as evidenced by mastery of 5/5 LTGs. Severe to profound expressive and moderate receptive aphasia remain at this time. He requires cues to clear L buccal cavity and maintain slow rate, but is tolerating D3 textures/thin liquids w/ minA. Frustration tolerance and receptiveness to education/cueing will continue to limit his overall progress, however, recommend cont ST upon d/c to target remaining deficits, maximize pt communication, and maximize pt independence.   COGNITION: Overall cognitive status: Difficulty to assess due to: Communication impairment  AUDITORY COMPREHENSION: Overall auditory comprehension: Impaired: simple, moderately complex, and complex YES/NO questions: Impaired: moderately complex and complex Following directions: Appears  intact, single step. Did not attempt multi-step Conversation: Impaired, Simple Interfering components: attention, processing speed, and working memory Effective technique: extra processing time, repetition/stressing words, slowed speech, and stressing words  READING COMPREHENSION: Impaired: L neglect  EXPRESSION: verbal  VERBAL EXPRESSION: Level of generative/spontaneous verbalization: conversation Automatic speech: name: impaired, social response: impaired, counting: intact, and day of week: impaired  Repetition: Impaired: Word Naming: Confrontation: 0% accuracy Pragmatics: Impaired: abnormal effect, topic appropriateness, topic maintenance, and turn taking Interfering components: auditory comprehension, attention  Effective technique: sentence completion, phonemic cues, and articulatory cues Non-verbal means of communication: none  WRITTEN EXPRESSION: Dominant hand: right Written expression: Impaired: word  MOTOR SPEECH: Overall motor speech: Appears intact  ORAL MOTOR EXAMINATION: Overall status: Impaired:   Lingual: Left (ROM and Symmetry)  STANDARDIZED ASSESSMENTS: QAB severe impairment Word comprehension 10.00  Sentence comprehension 0.83  Word finding 0.00  Grammatical construction 2.00  Speech motor programming 10.00  Repetition 0.00  Reading 7.92  QAB overall 4.02   PATIENT REPORTED OUTCOME MEASURES (PROM): Deferred d/t severity of impairment  TREATMENT DATE:   03/21/24: Tabb continues to report some boredom at [facility] house. We generated list of activities he can do at home - See Patient instructions - he indicated he did enjoy jig saw puzzles, possibly some card games, adult coloring books. With questioning cues, Yossi indicated he used to do some sewing or knitting (unsure of accuracy due to aphasia) - provided list to his mom. Targeted  word finding and written expression generating names, planets, weapons with usual mod semantic cues and occasional min fill in the blank cues, Adryan named and wrote 24 Star Wars terms - writing he approximated the words, with usual mod copy cues which he elected not to use at times. In conversation requires consistent max questioning cues and semantic cues to communicate thoughts. With questioning, he did indicate that he had a successful visit with his family in the home.   03/09/24: Redding indicates he would like more activity during the day - he is unsure of is long term plan of staying with his mom. He continues to report difficulty communicating wants and needs with his mom. Unclear if he is trying to use any writing of letters to augment communication with mom at home. Targeted written word for multimodal communication. Jai required consistent max copy cues to write names of 5 basic objects - writing trials limited due to UE fatigue and weakness. In conversation, I used written key words to successfully cue Christoph for family names and topic changes with rare min A. Generated list of places he can go with max A - wrote park and Mizraim was able to verbalize macintosh on the [facility] as a park he can visit.   03/07/24: Targeted word finding and multi modal communication generating ingredients in his taco recipe and toppings. Dorn required consistent max written cues, written choice of 2-3, phonemic and /or phrase completion cues to name 10 words - he read the list of 10 words with extended time and usual mod phonemic or phrase completion cues. Generated list of 9 subjects his son will learn in kindergarten again with the same consistent max A, extended time and cues to read list of 9 subjects. Questioning cues required consistently in unstructured conversation due to ongoing empty perseverated speech.  03/02/24: Adorian continues to indicate that he does not want his mom to attend ST with him.  Targeted multimodal communication and word finding using written words on white board generating ingredients he puts in his meat sauce. With consistent fill in the blank cues, Jevante generated and verbalized 10 ingredients - occasional min phonemic cues to read each ingredient. He successfully indicated which ingredients he did not use when I provided the written cues. With written cues and semantic cues, he communicated that he went to the grocery store with his mom and was able to select items that he wanted - max A  (written choice of 3 paired with verbal choice) to communicate 2 snack foods he prefers.   02/29/24: Gabrielle has been discharged for OT and PT - he is electing to continue ST for his remaining visits. I am in agreement. He elects to defer use of SGD at this time. I will return the device to [employer]. Targeted word finding and multimodal communication generating autumn words - generated 7 words with consistent max semantic and written cues. In conversation re: Halloween costumes, with consistent max A - Caston communicated that he dressed up even as he got older - max A to generate 3 costumes with semantic, questioning  and written cues. Targeted simple money and mental math - despite max A, counting bills and change aloud, Demarrio unable to write amount and indicated that he could not do mental math.   02/17/24: Pt arrives alone - does not have SGD. He was not scheduled to see me but I had a cancellation. Targeted word finding and written expression in personally relevant categories - with fill in the blank cues, Antwane named 6 stores they frequent and wrote them with usual mod cues to correct errors. Due to hand  fatigue, we stopped writing- named his 7 burger toppings and 5 school supplies with fill in the blank cues - after Ondre read the lists with extended time. In conversation he used verbal compensation of yay to express he was glad to be seen alone today. Attempts to use  gestures result unsuccessful despite max A. Instructed him to bring SGD next visit  02/10/24: Targeted training on Touch Talk device - [name] added 10 icons, including folders for places and biographical information. Ezel selected icons to consistent written and verbal cues with cues to scan left, 8/10x with extended time. Targeted written expression while [name] was customizing device - he wrote family names with occasional min A to ID errors and consistent copy cues to correct errors wrote 4/5 family names correctly, 3 fruits and 3 tools, again with copy cues required to correct errors.  02/08/24: Introduced Touch Talk AAC device - demonstrated how to add icon and folder, use the photo option. 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 She is aware that she will need to initiate use of Touch Talk. Cincere had multiple episodes of becoming belligerent and agitated shaking his fists and yelling out. [name] reports this happens throughout the day and he has lashed out physically a few times, hitting her arm. He is up at night, waking her without awareness. Overall awareness, attention and decision making severely impaired. Encouraged him to try Seroquel  as prescribed by his MD. Tinnie reports he refuses to take new meds, today he agreed to try this med tonight. Psych consult pending.   02/02/24: Utilize interest and then tolerate to generate list of salient topics for future SGD customization.  Patient identifies the following: Eating out, family, food and meals, movies and TV, news and current events, personal needs, pets, sports, conflict.  For food and meal patient would benefit from folder dedicated to getting instruction to wife for preparation of preferred meals, including ingredients.  For support patient would benefit from folder for conversation with kids regarding their sports involvement including soccer T-ball and basketball.  Utilizing [facility] board and writing as multimodal communication  patient correctly answered 4 of 5 personal questions.  ABC board provided for use at home as patient continues to benefit from first letter cues for anomia repair.   01/26/24: Given significant aphasia, trialed multimodal communication today, including writing and [employer] TouchTalk device. Pt able to write wife's name with min error to augment naming. Otherwise, writing inconsistent with perseverations and agraphia. Demonstrated how to functionally utilize TouchTalk device for naming preferred foods items. Good oral reading accuracy exhibited. Able to ID preferred choice f/o 3 with good accuracy. Generated AAC categories related to his children. Required usual max A to name interests of children. Cued gestures were beneficial x2. Pt and wife interested in pursuing SGD trials, with SLP submitting information this session.  01/21/24: Explained rationale for speech generating device recommendation. Discussed benefits of increased independence with communication and to support rehabilitation.  Discussed severity of impairment and prior level of functioning supporting likelihood of slow rehab course, and benefit of SGD for improved communication while rehabilitating. Pt does not desire AAC at this time   PATIENT EDUCATION: Education details: POC/goals Person educated: Patient and Spouse Education method: Explanation Education comprehension: verbalized understanding and needs further education   GOALS: Goals reviewed with patient? Yes  SHORT TERM GOALS: Target date: 03/17/2024  Patient will use multimodal communication to communicate basic wants and needs with rare min-A Baseline: Goal status: ONGOING  2.  Patient and caregivers will generate x10 functional home-based targets for low or high tech AAC system  Baseline:  Goal status: ONGOING  3.  Patient will complete word level structured language tasks with mod-A  Baseline:  Goal status: ONGOING  4.  Pt will demonstrate accurate gesture for  given target with 80% accuracy  Baseline:  Goal status: ONGOING   LONG TERM GOALS: Target date: 04/14/2024  Pt will use multimodal communication to communicate preferences in simple conversation  Baseline:  Goal status: ONGOING  2.  Pt and caregivers will carryover 3 compensatory strategies to support pt's auditory comprehension  Baseline:  Goal status: ONGOING  3.  Pt and caregiver will demonstrate supportive communication techniques in conversation to support pt expressive language Baseline:  Goal status: ONGOING  4.  Pt and spouse will carryover 1-2 language enhancing activities at home daily 5/7 days  Baseline:  Goal status: ONGOING  5.  Pt will generate moderately complex sentences in structured task such as VNeST with usual min A  Baseline:  Goal status: ONGOING   ASSESSMENT:  CLINICAL IMPRESSION: Patient is a 53 y.o. M who was seen today for aphasia tx. Known to clinic from prior ST course, since has had another stroke. Communication impairment during prior ST course was severe, profound impairment at this time. Pt presenting with fluent aphsia c/b nonsensical speech that lacks meaning, tangential speech, usual use of a few rote phrases repetitively (the thing, the thing, that which is). During picture description task, pt says that, that, that with no content.  Benefit observed from phonemic and phrase completion cues to aid in anomia recovery.  Patient unable to repeat, challenges with answering yes no questions.  Per spouse primary means of communication was asking yes/no questions prior to most recent stroke, this new impairment has significantly impacted her communication and skills in the home environment.  Patient appears to lack awareness, he typically responds to questions with a long-winded nonsensical speech requiring SLP cues to stop.  Due to severity of impairment and prior level of function I highly recommend implementation of a speech generating device.  At  this time patient declines will continue to address in future therapy sessions.  Spouse appears receptive to idea of communication support.   OBJECTIVE IMPAIRMENTS: include attention, awareness, executive functioning, and aphasia. These impairments are limiting patient from effectively communicating at home and in community. Factors affecting potential to achieve goals and functional outcome are previous level of function and severity of impairments. Patient will benefit from skilled SLP services to address above impairments and improve overall function.  REHAB POTENTIAL: Fair (severity of impairments)  PLAN:  SLP FREQUENCY: 2x/week  SLP DURATION: 12 weeks  PLANNED INTERVENTIONS: Language facilitation, Cueing hierachy, Internal/external aids, Functional tasks, Multimodal communication approach, SLP instruction and feedback, Compensatory strategies, and Patient/family education    Alan Wise, CCC-SLP 03/21/2024, 11:05 AM

## 2024-03-23 ENCOUNTER — Other Ambulatory Visit: Payer: Self-pay | Admitting: Internal Medicine

## 2024-03-24 ENCOUNTER — Telehealth: Payer: Self-pay | Admitting: *Deleted

## 2024-03-29 ENCOUNTER — Ambulatory Visit

## 2024-04-04 ENCOUNTER — Ambulatory Visit

## 2024-04-04 ENCOUNTER — Encounter: Payer: Self-pay | Admitting: Physical Medicine and Rehabilitation

## 2024-04-04 ENCOUNTER — Encounter: Attending: Physical Medicine and Rehabilitation | Admitting: Physical Medicine and Rehabilitation

## 2024-04-04 VITALS — BP 125/83 | HR 62 | Ht 68.5 in | Wt 145.0 lb

## 2024-04-04 DIAGNOSIS — I6789 Other cerebrovascular disease: Secondary | ICD-10-CM

## 2024-04-04 DIAGNOSIS — R519 Headache, unspecified: Secondary | ICD-10-CM

## 2024-04-04 DIAGNOSIS — G40909 Epilepsy, unspecified, not intractable, without status epilepticus: Secondary | ICD-10-CM

## 2024-04-04 DIAGNOSIS — I63521 Cerebral infarction due to unspecified occlusion or stenosis of right anterior cerebral artery: Secondary | ICD-10-CM | POA: Diagnosis present

## 2024-04-04 DIAGNOSIS — R2681 Unsteadiness on feet: Secondary | ICD-10-CM | POA: Diagnosis not present

## 2024-04-04 DIAGNOSIS — C710 Malignant neoplasm of cerebrum, except lobes and ventricles: Secondary | ICD-10-CM | POA: Insufficient documentation

## 2024-04-04 DIAGNOSIS — R569 Unspecified convulsions: Secondary | ICD-10-CM | POA: Diagnosis not present

## 2024-04-04 DIAGNOSIS — I639 Cerebral infarction, unspecified: Secondary | ICD-10-CM | POA: Diagnosis not present

## 2024-04-04 DIAGNOSIS — R4701 Aphasia: Secondary | ICD-10-CM | POA: Insufficient documentation

## 2024-04-04 DIAGNOSIS — Y842 Radiological procedure and radiotherapy as the cause of abnormal reaction of the patient, or of later complication, without mention of misadventure at the time of the procedure: Secondary | ICD-10-CM | POA: Insufficient documentation

## 2024-04-04 DIAGNOSIS — I69318 Other symptoms and signs involving cognitive functions following cerebral infarction: Secondary | ICD-10-CM | POA: Diagnosis not present

## 2024-04-04 DIAGNOSIS — R451 Restlessness and agitation: Secondary | ICD-10-CM

## 2024-04-04 DIAGNOSIS — I69320 Aphasia following cerebral infarction: Secondary | ICD-10-CM | POA: Insufficient documentation

## 2024-04-04 DIAGNOSIS — G8929 Other chronic pain: Secondary | ICD-10-CM | POA: Insufficient documentation

## 2024-04-04 DIAGNOSIS — C719 Malignant neoplasm of brain, unspecified: Secondary | ICD-10-CM

## 2024-04-04 NOTE — Patient Instructions (Addendum)
  VISIT SUMMARY: You had a follow-up visit to discuss your behavioral issues and medication management after your right ACA stroke. We reviewed your current medications and symptoms, including headaches, fatigue, and spasticity. We also discussed your progress in therapy and daily activities.  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 -Continue current management and therapies.  BEHAVIORAL DISTURBANCE DUE TO FRONTAL LOBE DYSFUNCTION: You are experiencing behavioral disturbances due to frontal lobe dysfunction. Your current medication, quetiapine , has not significantly improved your frustration levels. -We will send a note to Dr. Vazow regarding your increased fatigue and medication changes. -We will discuss with Dr. Vazow about possibly reducing your propranolol  dosage.  CHRONIC LEFT-SIDED HEADACHE: You have constant, mild left-sided headaches that have not worsened but are persistent. -Continue monitoring your headaches and report any changes.  FATIGUE LIKELY MEDICATION-RELATED: You are experiencing increased sleepiness, which may be related to your medications. -We will discuss with Dr. Vazlow about possibly reducing your propranolol  dosage.  SPASTICITY OF LEFT HAMSTRINGS: You have spasticity in your left hamstrings, but it is not significantly affecting your function. -Follow up in 6 months to reassess your spasticity. -Consider medications or injections if spasticity worsens and affects your function.  SEIZURE DISORDER, CONTROLLED: Your seizure disorder is currently controlled with Lamictal . -Continue taking Lamictal  as prescribed.  Follow up on 6 months.                      Contains text generated by [employer].                                 Contains text generated by [employer].

## 2024-04-04 NOTE — Progress Notes (Signed)
 Subjective:    Patient ID: Alan Wise, male    DOB: [birth year], 53 y.o.   MRN: [id number]  HPI   Alan Wise is a 53 y.o. year old male  who  has a past medical history of Allergy, Complication of anesthesia, Diabetes mellitus (HCC), GERD (gastroesophageal reflux disease), Headache, History of kidney stones, Hyperlipidemia, and Renal disorder.   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  Plan from last visit: Acute right ACA Stroke: Aphasia, : Dr. Buckley Following Following. Continue current medication regimen. Continue Out[facility] at [facility]. Continue to monitor.  Essential Hypertension: Continue current medication regimen. Continue to monitor.  Seizure Disorder: Continue current medication regimen. Dr. Buckley following. Continue to monitor.    F/ U with Dr Emeline in 4- 6 weeks   Interval Hx: Discussed the use of AI scribe software for clinical note transcription with the patient, who gave verbal consent to proceed.  History of Present Illness   Alan Wise is a 53 year old male with a history of right ACA stroke who presents for follow-up regarding behavioral issues and medication management.  He has frontal lobe dysfunction from a right ACA stroke, leading to behavioral issues and agitation. Seroquel  25 mg at night has not significantly reduced frustration. Abilify  5 mg was discontinued due to cognitive and psychomotor slowing. He continues Lamictal , propranolol  20 mg twice daily, and amlodipine  5 mg daily.  He experiences constant, mild left-sided headaches without worsening and no relief from medications or other techniques.  He has completed physical and occupational therapy and has one speech therapy session remaining. He is independent in activities of daily living but does not engage in activities outside the home and is not driving due  to a seizure disorder.  He experiences increased sleepiness, possibly medication-related, but the specific cause is unclear. He lives in an apartment with elevators, has no issues with steps, and can walk independently.      Recently living with his mother due to aggression with his wife, brought on by poor frustration tolerance with aphasia. Mother has not had any issues with this since medication adjustments.   Pain Inventory Average Pain 0 Pain Right Now 0 My pain is no pain  LOCATION OF PAIN  left side vision, left arm / hand mobility, left leg,   BOWEL Number of stools per week: 3-4 per week Oral laxative use No  Type of laxative None   BLADDER Normal    Mobility how many minutes can you walk? unknown ability to climb steps?  yes do you drive?  no Do you have any goals in this area?  yes  Function employed # of hrs/week on medical leave  I need assistance with the following:  meal prep and household duties Do you have any goals in this area?  yes  Neuro/Psych weakness depression  Prior Studies Any changes since last visit?  no  Physicians involved in your care Any changes since last visit?  no   Family History  Problem Relation Age of Onset   Hyperlipidemia Father    Hypertension Father    Diabetes Father    Benign prostatic hyperplasia Father    Stroke Maternal Grandmother    Diabetes Maternal Grandmother    Stroke Paternal Grandmother    Hypertension Paternal Grandmother    Kidney disease Neg Hx    Prostate cancer Neg Hx  Social History   Socioeconomic History   Marital status: Married    Spouse name: Not on file   Number of children: Not on file   Years of education: Not on file   Highest education level: Not on file  Occupational History   Not on file  Tobacco Use   Smoking status: Never   Smokeless tobacco: Never  Vaping Use   Vaping status: Never Used  Substance and Sexual Activity   Alcohol use: Not Currently    Comment: rare    Drug use: No   Sexual activity: Not Currently  Other Topics Concern   Not on file  Social History Narrative   Not on file   Social Drivers of Health   Financial Resource Strain: Low Risk  (12/05/2022)   Received from [facility]   Overall Financial Resource Strain (CARDIA)    Difficulty of Paying Living Expenses: Not hard at all  Food Insecurity: No Food Insecurity (03/11/2024)   Hunger Vital Sign    Worried About Running Out of Food in the Last Year: Never true    Ran Out of Food in the Last Year: Never true  Transportation Needs: No Transportation Needs (03/11/2024)   PRAPARE - Administrator, Civil Service (Medical): No    Lack of Transportation (Non-Medical): No  Recent Concern: Transportation Needs - Unmet Transportation Needs (02/19/2024)   PRAPARE - Transportation    Lack of Transportation (Medical): Yes    Lack of Transportation (Non-Medical): Yes  Physical Activity: Not on file  Stress: Stress Concern Present (02/19/2024)   [employer] of Occupational Health - Occupational Stress Questionnaire    Feeling of Stress: To some extent  Social Connections: Moderately Isolated (12/09/2023)   Social Connection and Isolation Panel    Frequency of Communication with Friends and Family: More than three times a week    Frequency of Social Gatherings with Friends and Family: More than three times a week    Attends Religious Services: Never    Database administrator or Organizations: No    Attends Banker Meetings: Never    Marital Status: Married   Past Surgical History:  Procedure Laterality Date   APPLICATION OF CRANIAL NAVIGATION Right 01/17/2021   Procedure: APPLICATION OF CRANIAL NAVIGATION;  Surgeon: Debby Dorn MATSU, MD;  Location: [facility] OR;  Service: Neurosurgery;  Laterality: Right;   COLONOSCOPY  09/03/1994   COLONOSCOPY WITH PROPOFOL  N/A 01/07/2023   Procedure: COLONOSCOPY WITH PROPOFOL ;  Surgeon: Jinny Carmine, MD;   Location: [facility] ENDOSCOPY;  Service: Endoscopy;  Laterality: N/A;  NOT TOO EARLY   CYSTOSCOPY WITH STENT PLACEMENT Right 01/25/2016   Procedure: CYSTOSCOPY WITH STENT PLACEMENT;  Surgeon: Redell Lynwood Napoleon, MD;  Location: [facility] ORS;  Service: Urology;  Laterality: Right;   FRAMELESS  BIOPSY WITH BRAINLAB Right 01/17/2021   Procedure: RIGHT STEREOTACTIC BIOPSY OF INSULAR LESION;  Surgeon: Debby Dorn MATSU, MD;  Location[facility] OR;  Service: Neurosurgery;  Laterality: Right;   KNEE ARTHROSCOPY W/ MENISCAL REPAIR Right 06/23/1988   POLYPECTOMY  01/07/2023   Procedure: POLYPECTOMY;  Surgeon: Jinny Carmine, MD;  Location: [facility] ENDOSCOPY;  Service: Endoscopy;;   removal of birthmark  06/08/2013   SKIN CANCER EXCISION  06/23/2012   TYMPANOSTOMY TUBE PLACEMENT  08/06/1978   URETEROSCOPY WITH HOLMIUM LASER LITHOTRIPSY Right 01/25/2016   Procedure: URETEROSCOPY WITH HOLMIUM LASER LITHOTRIPSY;  Surgeon: Redell Lynwood Napoleon, MD;  Location: [facility] ORS;  Service: Urology;  Laterality: Right;   URETHRAL  STRICTURE DILATATION     visual inspection of vocal cord     1973   WISDOM TOOTH EXTRACTION     Past Medical History:  Diagnosis Date   Allergy    Complication of anesthesia    pt reports he is starting to have more difficulty arousing after surgery   Diabetes mellitus (HCC)    GERD (gastroesophageal reflux disease)    Headache    History of kidney stones    Hyperlipidemia    Renal disorder    kidney stones   BP 125/83   Pulse 62   Ht 5' 8.5 (1.74 m)   Wt 145 lb (65.8 kg)   SpO2 95%   BMI 21.73 kg/m   Opioid Risk Score:   Fall Risk Score:  `1  Depression screen [facility] 2/9     04/04/2024   10:35 AM 03/11/2024   10:15 AM 02/02/2024    1:58 PM 11/17/2023    1:27 PM 02/16/2023    9:47 AM 11/14/2022   10:26 AM 05/01/2022   10:19 AM  Depression screen PHQ 2/9  Decreased Interest 1 0 0 1 0 0 0  Down, Depressed, Hopeless 1 1 0 0 0 0 1  PHQ - 2 Score 2 1 0 1 0 0 1  Altered sleeping   0 0     Tired,  decreased energy   0 2     Change in appetite   0 1     Feeling bad or failure about yourself    0 1     Trouble concentrating   0 2     Moving slowly or fidgety/restless   0 1     Suicidal thoughts   0 1     PHQ-9 Score   0 9     Difficult doing work/chores    Somewhat difficult       Review of Systems  Eyes:  Positive for visual disturbance.  Neurological:  Positive for speech difficulty and weakness.  Psychiatric/Behavioral:         Depression  All other systems reviewed and are negative.      Objective:   Physical Exam   PE: Constitution: Appropriate appearance for age. No apparent distress  Resp: No respiratory distress. No accessory muscle usage.  Cardio: Well perfused appearance.  No peripheral edema. Abdomen: Nondistended. Nontender.   Psych: Poor frustration tolerance, overall approrpiate    Neurologic Exam:   CN: Global aphasia with expressive >> receptive components. Follow 2/3 simple commands, improves with visual cues.  Babinsky: flexor responses b/l.   Hoffmans: negative b/l Sensory exam: revealed normal sensation in all dermatomal regions in bilateral  UE and LE. Motor exam: strength normal in all myotomal regions of the RUE and RLE; LUE and LLE 5-/5 with motor apraxia . Coordination: Fine motor coordination was reduced in the LUE and LLE Gait: Gait was normal.   Spasticity: Mas 2 L hamstrings; otherwise 0      Assessment & Plan:   Alan Wise is a 53 y.o. year old male  who  has a past medical history of Allergy, Complication of anesthesia, Diabetes mellitus (HCC), GERD (gastroesophageal reflux disease), Headache, History of kidney stones, Hyperlipidemia, and Renal disorder.    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 -Continue current management and therapies.  BEHAVIORAL DISTURBANCE DUE TO FRONTAL LOBE DYSFUNCTION: You are experiencing behavioral disturbances due to frontal lobe dysfunction. Your current medication, quetiapine , has not significantly improved your frustration levels. -We will send a note to Dr. Vazow regarding your increased fatigue and medication changes. -We will discuss with Dr. Vazow about possibly reducing your propranolol  dosage.  CHRONIC LEFT-SIDED HEADACHE: You have constant, mild left-sided headaches that have not worsened but are persistent. -Continue monitoring your headaches and report any changes.  FATIGUE LIKELY MEDICATION-RELATED: You are experiencing increased sleepiness, which may be related to your medications. -We will discuss with Dr. Vazlow about possibly reducing your propranolol  dosage.  SPASTICITY OF LEFT HAMSTRINGS: You have spasticity in your left hamstrings, but it is not significantly affecting your function. -Follow up in 6 months to reassess your spasticity. -Consider medications or injections if spasticity worsens and affects your function.  SEIZURE DISORDER, CONTROLLED: Your seizure disorder is currently controlled with Lamictal . -Continue taking Lamictal  as prescribed.  Follow up on 6 months.

## 2024-04-05 ENCOUNTER — Other Ambulatory Visit: Payer: Self-pay

## 2024-04-07 ENCOUNTER — Ambulatory Visit
Admission: RE | Admit: 2024-04-07 | Discharge: 2024-04-07 | Disposition: A | Discharge status: Home / Self Care | Source: Ambulatory Visit | Attending: Internal Medicine | Admitting: Internal Medicine

## 2024-04-07 DIAGNOSIS — I6782 Cerebral ischemia: Secondary | ICD-10-CM | POA: Insufficient documentation

## 2024-04-07 DIAGNOSIS — I6789 Other cerebrovascular disease: Secondary | ICD-10-CM

## 2024-04-07 DIAGNOSIS — G9389 Other specified disorders of brain: Secondary | ICD-10-CM | POA: Insufficient documentation

## 2024-04-07 DIAGNOSIS — Y842 Radiological procedure and radiotherapy as the cause of abnormal reaction of the patient, or of later complication, without mention of misadventure at the time of the procedure: Secondary | ICD-10-CM | POA: Diagnosis not present

## 2024-04-07 DIAGNOSIS — C710 Malignant neoplasm of cerebrum, except lobes and ventricles: Secondary | ICD-10-CM | POA: Diagnosis present

## 2024-04-07 MED ORDER — GADOBUTROL 1 MMOL/ML IV SOLN
6.0000 mL | Freq: Once | INTRAVENOUS | Status: AC | PRN
  Administered 2024-04-07: 6 mL via INTRAVENOUS

## 2024-04-11 ENCOUNTER — Ambulatory Visit

## 2024-04-11 ENCOUNTER — Inpatient Hospital Stay: Admitting: Internal Medicine

## 2024-04-13 ENCOUNTER — Telehealth: Payer: Self-pay

## 2024-04-13 NOTE — Telephone Encounter (Signed)
 LVM  for pt wife in regard to FMLA forms being completed, and faxed.SABRA Pt wife copy was emailed as requested.

## 2024-04-19 ENCOUNTER — Ambulatory Visit

## 2024-04-19 ENCOUNTER — Ambulatory Visit: Admitting: Speech Pathology

## 2024-04-22 ENCOUNTER — Inpatient Hospital Stay: Attending: Internal Medicine | Admitting: Internal Medicine

## 2024-04-22 VITALS — BP 130/85 | HR 61 | Resp 18 | Ht 68.5 in | Wt 149.0 lb

## 2024-04-22 DIAGNOSIS — C719 Malignant neoplasm of brain, unspecified: Secondary | ICD-10-CM | POA: Diagnosis not present

## 2024-04-22 DIAGNOSIS — G40909 Epilepsy, unspecified, not intractable, without status epilepticus: Secondary | ICD-10-CM | POA: Diagnosis not present

## 2024-04-22 DIAGNOSIS — R569 Unspecified convulsions: Secondary | ICD-10-CM | POA: Diagnosis not present

## 2024-04-22 DIAGNOSIS — Y842 Radiological procedure and radiotherapy as the cause of abnormal reaction of the patient, or of later complication, without mention of misadventure at the time of the procedure: Secondary | ICD-10-CM

## 2024-04-22 DIAGNOSIS — Z79899 Other long term (current) drug therapy: Secondary | ICD-10-CM | POA: Diagnosis not present

## 2024-04-22 DIAGNOSIS — I6789 Other cerebrovascular disease: Secondary | ICD-10-CM | POA: Diagnosis not present

## 2024-04-22 DIAGNOSIS — Z9221 Personal history of antineoplastic chemotherapy: Secondary | ICD-10-CM | POA: Diagnosis not present

## 2024-04-22 DIAGNOSIS — C711 Malignant neoplasm of frontal lobe: Secondary | ICD-10-CM | POA: Insufficient documentation

## 2024-04-22 DIAGNOSIS — Z923 Personal history of irradiation: Secondary | ICD-10-CM | POA: Diagnosis not present

## 2024-04-22 NOTE — Progress Notes (Signed)
 Patients mother has questions about medication timing and side effects.  Patient has been sleepy during the day, runny, throat clearing when he eats.

## 2024-04-22 NOTE — Progress Notes (Signed)
 [name] [address]  [name], [name]  Interval Evaluation  Date of Service: 04/22/24 Patient Name: [facility] Patient MRN: [id number] Patient DOB: [birth year] Provider: Arthea MARLA Manns, MD  Identifying Statement:  [facility] is a 53 y.o. 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 Debby; path demonstrates IDH-1 mutant oligodendroglioma   03/20/2021 - 04/30/2021 Radiation Therapy   Completes 54 Gy IMRT and Temodar  with Dr. Izell   06/02/2021 - 03/05/2022 Chemotherapy   Completes 8 cycles of adjuvant 5-day Temozolomide    02/01/2022 Progression   Progression of disease in multifocal/metastatic/lmd pattern   03/11/2022 Progression   Progression of disease confirmed after additional cycle of TMZ, 1 month follow up MRI.  LP/cytology and MRI spine mets screening unremarkable   04/10/2022 Surgery   Stereotactic biopsy of progressive R frontal lesion at [facility] with Dr. Saturnino.  Path demonstrates radiation necrosis   12/03/2022 Surgery   Repeat biopsy with Dr. Saturnino, R frontal.  Path again demonstrates necrosis and treatment effect   03/12/2023 -  Chemotherapy   Patient is on Treatment Plan : BRAIN GBM Bevacizumab  14d x 6 cycles       Biomarkers:   Interval History: [facility] presents today for follow up after recent MRI brain.  He and his mother report no change in left sided strength, he continues to walk independently.  Speech and language have not improved unfortunately.  Continues to speak in word salad.  Lots of behavioral issues persist at home, now living mostly with his mother.  He is sleeping a lot during the day in addition to at night.  He is dosing Eliquis , Lamictal , Seroquel  otherwise unchanged.      H+P (12/31/20) Patient presented to medical attention this past month, with complaint of  involuntary left sided shaking.  He describes episodes, occurring several times per week, of left arm shaking, lasting up to 5 minutes; following the episodes he describes the arm as heavy for some time before returning to normal.  He also describes episodes of sudden inability to speak, with strange movements of face and hands, lasting same amount of time.  These episodes go back ~10 years in very sporadic nature, but have become much more frequent in recent months.  He works full time as a agricultural consultant at [employer].  These episodes have occurred at work, and have been disruptive in that environment.  He is otherwise fully functional, independent.  Medications: Current Outpatient Medications on File Prior to Visit  Medication Sig Dispense Refill   acetaminophen  (TYLENOL ) 325 MG tablet Take 1-2 tablets (325-650 mg total) by mouth every 4 (four) hours as needed for mild pain (pain score 1-3).     amLODipine  (NORVASC ) 5 MG tablet TAKE 1 TABLET (5 MG TOTAL) BY MOUTH DAILY. 30 tablet 2   apixaban  (ELIQUIS ) 5 MG TABS tablet Take 1 tablet (5 mg total) by mouth 2 (two) times daily. 60 tablet 0   atorvastatin  (LIPITOR) 40 MG tablet Take 1 tablet (40 mg total) by mouth daily with supper. 30 tablet 0   lamoTRIgine  (LAMICTAL ) 150 MG tablet TAKE 1 TABLET BY MOUTH TWICE A DAY 60 tablet 2   propranolol  (INDERAL ) 20 MG tablet TAKE 1 TABLET BY MOUTH TWICE A DAY 60 tablet 1   QUEtiapine  (SEROQUEL ) 25 MG tablet Take 1 tablet (25 mg total) by mouth  2 (two) times daily. 60 tablet 2   ASPIRIN  LOW DOSE 81 MG tablet Take 81 mg by mouth daily. (Patient not taking: Reported on 04/22/2024)     feeding supplement (ENSURE PLUS HIGH PROTEIN) LIQD Take 237 mLs by mouth 3 (three) times daily between meals. (Patient not taking: Reported on 04/22/2024)     Multiple Vitamin (MULTIVITAMIN WITH MINERALS) TABS tablet Take 1 tablet by mouth daily. (Patient not taking: Reported on 04/22/2024) 30 tablet 0   senna-docusate  (SENOKOT-S) 8.6-50 MG tablet Take 2 tablets by mouth daily with supper. (Patient not taking: Reported on 04/22/2024) 60 tablet 0   No current facility-administered medications on file prior to visit.    Allergies:  Allergies  Allergen Reactions   Contrast Media [Iodinated Contrast Media] Swelling   Keppra  [Levetiracetam ]     Agitated and mean   Past Medical History:  Past Medical History:  Diagnosis Date   Allergy    Complication of anesthesia    pt reports he is starting to have more difficulty arousing after surgery   Diabetes mellitus (HCC)    GERD (gastroesophageal reflux disease)    Headache    History of kidney stones    Hyperlipidemia    Renal disorder    kidney stones   Past Surgical History:  Past Surgical History:  Procedure Laterality Date   APPLICATION OF CRANIAL NAVIGATION Right 01/17/2021   Procedure: APPLICATION OF CRANIAL NAVIGATION;  Surgeon: Debby Dorn MATSU, MD;  Location: [facility] OR;  Service: Neurosurgery;  Laterality: Right;   COLONOSCOPY  09/03/1994   COLONOSCOPY WITH PROPOFOL  N/A 01/07/2023   Procedure: COLONOSCOPY WITH PROPOFOL ;  Surgeon: Jinny Carmine, MD;  Location: [facility] ENDOSCOPY;  Service: Endoscopy;  Laterality: N/A;  NOT TOO EARLY   CYSTOSCOPY WITH STENT PLACEMENT Right 01/25/2016   Procedure: CYSTOSCOPY WITH STENT PLACEMENT;  Surgeon: Redell Lynwood Napoleon, MD;  Location: [facility] ORS;  Service: Urology;  Laterality: Right;   FRAMELESS  BIOPSY WITH BRAINLAB Right 01/17/2021   Procedure: RIGHT STEREOTACTIC BIOPSY OF INSULAR LESION;  Surgeon: Debby Dorn MATSU, MD;  Location: [facility] OR;  Service: Neurosurgery;  Laterality: Right;   KNEE ARTHROSCOPY W/ MENISCAL REPAIR Right 06/23/1988   POLYPECTOMY  01/07/2023   Procedure: POLYPECTOMY;  Surgeon: Jinny Carmine, MD;  Location: [facility] ENDOSCOPY;  Service: Endoscopy;;   removal of birthmark  06/08/2013   SKIN CANCER EXCISION  06/23/2012   TYMPANOSTOMY TUBE PLACEMENT  08/06/1978   URETEROSCOPY WITH HOLMIUM LASER  LITHOTRIPSY Right 01/25/2016   Procedure: URETEROSCOPY WITH HOLMIUM LASER LITHOTRIPSY;  Surgeon: Redell Lynwood Napoleon, MD;  Location: [facility] ORS;  Service: Urology;  Laterality: Right;   URETHRAL STRICTURE DILATATION     visual inspection of vocal cord     1973   WISDOM TOOTH EXTRACTION     Social History:  Social History   Socioeconomic History   Marital status: Married    Spouse name: Not on file   Number of children: Not on file   Years of education: Not on file   Highest education level: Not on file  Occupational History   Not on file  Tobacco Use   Smoking status: Never   Smokeless tobacco: Never  Vaping Use   Vaping status: Never Used  Substance and Sexual Activity   Alcohol use: Not Currently    Comment: rare   Drug use: No   Sexual activity: Not Currently  Other Topics Concern   Not on file  Social History Narrative   Not on file  Social Drivers of Corporate Investment Banker Strain: Low Risk  (12/05/2022)   Received from [facility]   Overall Financial Resource Strain (CARDIA)    Difficulty of Paying Living Expenses: Not hard at all  Food Insecurity: No Food Insecurity (03/11/2024)   Hunger Vital Sign    Worried About Running Out of Food in the Last Year: Never true    Ran Out of Food in the Last Year: Never true  Transportation Needs: No Transportation Needs (03/11/2024)   PRAPARE - Administrator, Civil Service (Medical): No    Lack of Transportation (Non-Medical): No  Recent Concern: Transportation Needs - Unmet Transportation Needs (02/19/2024)   PRAPARE - Transportation    Lack of Transportation (Medical): Yes    Lack of Transportation (Non-Medical): Yes  Physical Activity: Not on file  Stress: Stress Concern Present (02/19/2024)   [employer] of Occupational Health - Occupational Stress Questionnaire    Feeling of Stress: To some extent  Social Connections: Moderately Isolated (12/09/2023)   Social Connection and  Isolation Panel    Frequency of Communication with Friends and Family: More than three times a week    Frequency of Social Gatherings with Friends and Family: More than three times a week    Attends Religious Services: Never    Database Administrator or Organizations: No    Attends Banker Meetings: Never    Marital Status: Married  Catering Manager Violence: Not At Risk (03/11/2024)   Humiliation, Afraid, Rape, and Kick questionnaire    Fear of Current or Ex-Partner: No    Emotionally Abused: No    Physically Abused: No    Sexually Abused: No   Family History:  Family History  Problem Relation Age of Onset   Hyperlipidemia Father    Hypertension Father    Diabetes Father    Benign prostatic hyperplasia Father    Stroke Maternal Grandmother    Diabetes Maternal Grandmother    Stroke Paternal Grandmother    Hypertension Paternal Grandmother    Kidney disease Neg Hx    Prostate cancer Neg Hx     Review of Systems: Constitutional: Doesn't report fevers, chills or abnormal weight loss Eyes: Doesn't report blurriness of vision Ears, nose, mouth, throat, and face: Doesn't report sore throat Respiratory: Doesn't report cough, dyspnea or wheezes Cardiovascular: Doesn't report palpitation, chest discomfort  Gastrointestinal:  Doesn't report nausea, constipation, diarrhea GU: Doesn't report incontinence Skin: Doesn't report skin rashes Neurological: Per HPI Musculoskeletal: Doesn't report joint pain Behavioral/Psych: Doesn't report anxiety  Physical Exam: Vitals:   04/22/24 0903  BP: 130/85  Pulse: 61  Resp: 18  SpO2: 100%   KPS: 70. General: Fatigued Head: Normal EENT: No conjunctival injection or scleral icterus.  Lungs: Resp effort normal Cardiac: Regular rate Abdomen: Non-distended abdomen Skin: No rashes cyanosis or petechiae. Extremities: No clubbing or edema  Neurologic Exam: Mental Status: Awake, alert, attentive to examiner. Oriented to self and  environment.  Language is notable for impaired fluency, with dense impairments in comprehension.  Further mental status limited by aphasia.  Cranial Nerves: Visual acuity is grossly normal. Right field hemianopia. Extra-ocular movements intact. No ptosis. Face symmetric. Motor: Left arm and leg 5/5. Power is full in both arms and legs. Reflexes are symmetric, no pathologic reflexes present.  Sensory: Intact to light touch Gait: Independent   Labs: I have reviewed the data as listed    Component Value Date/Time   NA 138 01/11/2024 0522  NA 138 05/04/2012 1815   K 4.0 01/11/2024 0522   K 3.5 05/04/2012 1815   CL 104 01/11/2024 0522   CL 99 05/04/2012 1815   CO2 24 01/11/2024 0522   CO2 29 05/04/2012 1815   GLUCOSE 167 (H) 01/11/2024 0522   GLUCOSE 69 05/04/2012 1815   BUN 14 01/11/2024 0522   BUN 12 05/04/2012 1815   CREATININE 0.76 01/11/2024 0522   CREATININE 0.95 08/19/2023 0818   CALCIUM  9.3 01/11/2024 0522   CALCIUM  9.2 05/04/2012 1815   PROT 6.5 01/06/2024 0434   PROT 8.5 (H) 05/04/2012 1815   ALBUMIN 3.9 01/06/2024 0434   ALBUMIN 4.5 05/04/2012 1815   AST 21 01/06/2024 0434   AST 15 05/22/2023 0936   ALT 37 01/06/2024 0434   ALT 16 05/22/2023 0936   ALT 77 05/04/2012 1815   ALKPHOS 80 01/06/2024 0434   ALKPHOS 129 05/04/2012 1815   BILITOT 0.7 01/06/2024 0434   BILITOT 0.5 05/22/2023 0936   GFRNONAA >60 01/11/2024 0522   GFRNONAA >60 05/22/2023 0936   GFRNONAA 106 12/05/2020 1014   GFRAA 123 12/05/2020 1014   Lab Results  Component Value Date   WBC 9.9 01/11/2024   NEUTROABS 6.0 12/25/2023   HGB 13.8 01/11/2024   HCT 39.9 01/11/2024   MCV 84.9 01/11/2024   PLT 211 01/11/2024   Imaging:  [facility] Clinician Interpretation: I have personally reviewed the CNS images as listed.  My interpretation, in the context of the patient's clinical presentation, is stable disease  MR BRAIN W WO CONTRAST Result Date: 04/07/2024 EXAM: MRI BRAIN WITH AND WITHOUT CONTRAST  04/07/2024 09:40:08 AM TECHNIQUE: Multiplanar multisequence MRI of the head/brain was performed with and without the administration of intravenous contrast. COMPARISON: Brain MRI 01/05/2024 and earlier. Postcontrast MRI 12/08/2023. CLINICAL HISTORY: 53 year old male with history of multifocal infarcts and right hemisphere infiltrative neoplasm restaging. Weakness on left. FINDINGS: BRAIN AND VENTRICLES: No acute infarct. No acute intracranial hemorrhage. No mass effect or midline shift. No hydrocephalus. The sella is unremarkable. Normal flow voids. No mass or abnormal enhancement. History of multifocal infarcts. Encephalomalacia and volume loss in the right ACA territory, extensive now, seemingly multifactorial. Focal cystic encephalomalacia along the anterior right insula and operculum, also again noted. Further ex vacuo enlargement of the right lateral ventricle since 01/05/2024. T2 and FLAIR hyperintensity in the right hemisphere with no areas of progression or mass effect when compared to 01/05/2024. Chronic hemosiderin in the anterior right frontal lobe, along the right lateral ventricle, and in the right deep gray nuclei, appears stable on SWI. Mild progression of brainstem Wallerian degeneration on the right and in the midline. Stable major vascular flow voids. Following contrast, the major dural venous sinuses are enhancing and appear to be patent. Heterogeneous intrinsic T1 hyperintensity in the right frontal lobe, in association with chronic hemosiderin, and following contrast enhancement appears further regressed since the most recent postcontrast MRI 12/08/2023. Petechial enhancement continues along the anterior right frontal horn, but stable. No areas of progressed or suspicious enhancement. No dural thickening identified. No areas of progressed DWI abnormality. No areas suspicious for acute infarct. No midline shift or significant intracranial mass effect. ORBITS: No acute abnormality. SINUSES: Mild  paranasal sinus disease is stable. BONES AND SOFT TISSUES: Normal bone marrow signal and enhancement. No acute soft tissue abnormality. IMPRESSION: 1. Apparent multifactorial signal abnormality in the brain; both post-ischemic and treated infiltrative neoplasm. 2. Some additional encephalomalacia in the right hemisphere since July. No evidence of tumor  progression. No acute infarct identified. Electronically signed by: Helayne Hurst MD 04/07/2024 10:25 AM EDT RP Workstation: [id number]    Assessment/Plan Oligodendroglioma (HCC)  Radiation therapy induced brain necrosis  Seizure disorder (HCC)  [facility] is clinically stable today with overall improved right sided motor function since stroke admission over the summer.  MRI brain demonstrates stable findings, c/w resolved radionecrosis, prior infarcts and stable tumor burden.  He is stable from focal/stroke standpoint on the Eliquis  5mg  BID.    Daytime sleepiness is a more prominent issue reported today by his mother.  We discussed cessation Seroquel  given increased sleep volume, changed living situation.  He is also seizure free at this time.  Lacmital will con't 150mg  BID.  Con't Propanolol 20mg  BID which may help with headache prevention as well as blood pressure. 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  We ask that [facility] return to clinic in 4 months following next brain MRI, or sooner as needed.  All questions were answered. The patient knows to call the clinic with any problems, questions or concerns. No barriers to learning were detected.  The total time spent in the encounter was 40 minutes and more than 50% was on counseling and review of test results   Alan MARLA Manns, MD Medical Director of Neuro-Oncology [facility] 04/22/24 8:57 AM

## 2024-04-23 ENCOUNTER — Other Ambulatory Visit: Payer: Self-pay | Admitting: Internal Medicine

## 2024-05-07 ENCOUNTER — Other Ambulatory Visit: Payer: Self-pay | Admitting: Internal Medicine

## 2024-05-09 ENCOUNTER — Encounter: Payer: Self-pay | Admitting: Internal Medicine

## 2024-05-18 ENCOUNTER — Other Ambulatory Visit: Payer: Self-pay | Admitting: Internal Medicine

## 2024-05-18 NOTE — Telephone Encounter (Signed)
 Lamictal  150 mg and Inderal  20 mg refilled per OV note (04/22/24). Amlodipine  discontinued per note.

## 2024-05-23 ENCOUNTER — Telehealth: Payer: Self-pay | Admitting: Internal Medicine

## 2024-05-23 ENCOUNTER — Other Ambulatory Visit: Payer: Self-pay

## 2024-05-23 MED ORDER — APIXABAN 5 MG PO TABS: 5.0000 mg | ORAL_TABLET | Freq: Two times a day (BID) | ORAL | 3 refills | Status: DC

## 2024-05-23 MED ORDER — ATORVASTATIN CALCIUM 40 MG PO TABS: 40.0000 mg | ORAL_TABLET | Freq: Every day | ORAL | 3 refills | Status: DC

## 2024-05-23 NOTE — Telephone Encounter (Signed)
 I have spoken to patient's mother.  Informed her that Lamictal  and Propanolol was sent to pharamcy last week.  She is also requesting Dr. Buckley send in refills for Eliquis  and Atorvastatin .  Refill request pended and forwarded to Dr. Buckley.

## 2024-05-23 NOTE — Addendum Note (Signed)
 Addended by: Nerida Boivin K on: 05/23/2024 11:49 AM   Modules accepted: Orders

## 2024-05-23 NOTE — Telephone Encounter (Signed)
 Patient's mother wants to know if Dr. Buckley will send refills for Eliquis  and Atorvastatin , rx pended for MD review.

## 2024-05-23 NOTE — Telephone Encounter (Signed)
 [facility] is aware that refills have been sent to pharmacy

## 2024-05-23 NOTE — Telephone Encounter (Signed)
 Pt mother, Stoney, came into clinic to state that pt needs refills on his prescriptions. She would like to be called at [phone]

## 2024-05-23 NOTE — Telephone Encounter (Signed)
 Pt mother also wants to know if these medications can be written for a longer day supply like 60 or 90 days

## 2024-05-24 ENCOUNTER — Ambulatory Visit: Admitting: Internal Medicine

## 2024-05-24 ENCOUNTER — Encounter: Payer: Self-pay | Admitting: Internal Medicine

## 2024-05-24 VITALS — BP 128/84 | Ht 68.5 in | Wt 149.2 lb

## 2024-05-24 DIAGNOSIS — E1169 Type 2 diabetes mellitus with other specified complication: Secondary | ICD-10-CM

## 2024-05-24 DIAGNOSIS — G40909 Epilepsy, unspecified, not intractable, without status epilepticus: Secondary | ICD-10-CM

## 2024-05-24 DIAGNOSIS — I10 Essential (primary) hypertension: Secondary | ICD-10-CM

## 2024-05-24 DIAGNOSIS — Z8673 Personal history of transient ischemic attack (TIA), and cerebral infarction without residual deficits: Secondary | ICD-10-CM

## 2024-05-24 DIAGNOSIS — C719 Malignant neoplasm of brain, unspecified: Secondary | ICD-10-CM

## 2024-05-24 LAB — COMPREHENSIVE METABOLIC PANEL WITH GFR
AG Ratio: 2.5 (calc) (ref 1.0–2.5)
ALT: 46 U/L (ref 9–46)
AST: 23 U/L (ref 10–35)
Albumin: 4.8 g/dL (ref 3.6–5.1)
Alkaline phosphatase (APISO): 96 U/L (ref 35–144)
BUN: 10 mg/dL (ref 7–25)
CO2: 34 mmol/L — ABNORMAL HIGH (ref 20–32)
Calcium: 9.4 mg/dL (ref 8.6–10.3)
Chloride: 98 mmol/L (ref 98–110)
Creat: 0.89 mg/dL (ref 0.70–1.30)
Globulin: 1.9 g/dL (ref 1.9–3.7)
Glucose, Bld: 122 mg/dL — ABNORMAL HIGH (ref 65–99)
Potassium: 4 mmol/L (ref 3.5–5.3)
Sodium: 140 mmol/L (ref 135–146)
Total Bilirubin: 0.5 mg/dL (ref 0.2–1.2)
Total Protein: 6.7 g/dL (ref 6.1–8.1)
eGFR: 103 mL/min/1.73m2 (ref >=60)

## 2024-05-24 LAB — LIPID PANEL
Cholesterol: 95 mg/dL (ref <200)
HDL: 38 mg/dL — ABNORMAL LOW (ref >=40)
LDL Cholesterol (Calc): 42 mg/dL
Non-HDL Cholesterol (Calc): 57 mg/dL (ref <130)
Total CHOL/HDL Ratio: 2.5 (calc) (ref <5.0)
Triglycerides: 69 mg/dL (ref <150)

## 2024-05-24 LAB — CBC
HCT: 44.1 % (ref 39.4–51.1)
Hemoglobin: 14.9 g/dL (ref 13.2–17.1)
MCH: 30.2 pg (ref 27.0–33.0)
MCHC: 33.8 g/dL (ref 31.6–35.4)
MCV: 89.3 fL (ref 81.4–101.7)
MPV: 9 fL (ref 7.5–12.5)
Platelets: 211 Thousand/uL (ref 140–400)
RBC: 4.94 Million/uL (ref 4.20–5.80)
RDW: 12.2 % (ref 11.0–15.0)
WBC: 7 Thousand/uL (ref 3.8–10.8)

## 2024-05-24 LAB — HEMOGLOBIN A1C
Hgb A1c MFr Bld: 6 % — ABNORMAL HIGH (ref <5.7)
Mean Plasma Glucose: 126 mg/dL
eAG (mmol/L): 7 mmol/L

## 2024-05-24 NOTE — Assessment & Plan Note (Signed)
 Surveillance He will continue to follow with neurology and oncology

## 2024-05-24 NOTE — Assessment & Plan Note (Signed)
 Continue lamotrigine  150 mg twice daily He will continue to follow with neurology

## 2024-05-24 NOTE — Assessment & Plan Note (Signed)
 Continue propranolol  20 mg twice daily and amlodipine  5 mg daily He is considering stopping his amlodipine  due to daytime somnolence however discussed elevated blood pressures is a risk factor for stroke C-Met today

## 2024-05-24 NOTE — Assessment & Plan Note (Signed)
 A1c today Urine microalbumin has been checked within the last year Encourage low-carb diet Not medicated at this time Encouraged routine eye exam Encouraged routine foot exam He declines immunizations

## 2024-05-24 NOTE — Patient Instructions (Signed)
 Hypertension, Adult Hypertension is another name for high blood pressure. High blood pressure forces your heart to work harder to pump blood. This can cause problems over time. There are two numbers in a blood pressure reading. There is a top number (systolic) over a bottom number (diastolic). It is best to have a blood pressure that is below 120/80. What are the causes? The cause of this condition is not known. Some other conditions can lead to high blood pressure. What increases the risk? Some lifestyle factors can make you more likely to develop high blood pressure: Smoking. Not getting enough exercise or physical activity. Being overweight. Having too much fat, sugar, calories, or salt (sodium) in your diet. Drinking too much alcohol. Other risk factors include: Having any of these conditions: Heart disease. Diabetes. High cholesterol. Kidney disease. Obstructive sleep apnea. Having a family history of high blood pressure and high cholesterol. Age. The risk increases with age. Stress. What are the signs or symptoms? High blood pressure may not cause symptoms. Very high blood pressure (hypertensive crisis) may cause: Headache. Fast or uneven heartbeats (palpitations). Shortness of breath. Nosebleed. Vomiting or feeling like you may vomit (nauseous). Changes in how you see. Very bad chest pain. Feeling dizzy. Seizures. How is this treated? This condition is treated by making healthy lifestyle changes, such as: Eating healthy foods. Exercising more. Drinking less alcohol. Your doctor may prescribe medicine if lifestyle changes do not help enough and if: Your top number is above 130. Your bottom number is above 80. Your personal target blood pressure may vary. Follow these instructions at home: Eating and drinking  If told, follow the DASH eating plan. To follow this plan: Fill one half of your plate at each meal with fruits and vegetables. Fill one fourth of your plate  at each meal with whole grains. Whole grains include whole-wheat pasta, brown rice, and whole-grain bread. Eat or drink low-fat dairy products, such as skim milk or low-fat yogurt. Fill one fourth of your plate at each meal with low-fat (lean) proteins. Low-fat proteins include fish, chicken without skin, eggs, beans, and tofu. Avoid fatty meat, cured and processed meat, or chicken with skin. Avoid pre-made or processed food. Limit the amount of salt in your diet to less than 1,500 mg each day. Do not drink alcohol if: Your doctor tells you not to drink. You are pregnant, may be pregnant, or are planning to become pregnant. If you drink alcohol: Limit how much you have to: 0-1 drink a day for women. 0-2 drinks a day for men. Know how much alcohol is in your drink. In the [place] one drink equals one 12 oz bottle of beer (355 mL), one 5 oz glass of wine (148 mL), or one 1 oz glass of hard liquor (44 mL). Lifestyle  Work with your doctor to stay at a healthy weight or to lose weight. Ask your doctor what the best weight is for you. Get at least 30 minutes of exercise that causes your heart to beat faster (aerobic exercise) most days of the week. This may include walking, swimming, or biking. Get at least 30 minutes of exercise that strengthens your muscles (resistance exercise) at least 3 days a week. This may include lifting weights or doing Pilates. Do not smoke or use any products that contain nicotine or tobacco. If you need help quitting, ask your doctor. Check your blood pressure at home as told by your doctor. Keep all follow-up visits. Medicines Take over-the-counter and prescription medicines  only as told by your doctor. Follow directions carefully. Do not skip doses of blood pressure medicine. The medicine does not work as well if you skip doses. Skipping doses also puts you at risk for problems. Ask your doctor about side effects or reactions to medicines that you should watch  for. Contact a doctor if: You think you are having a reaction to the medicine you are taking. You have headaches that keep coming back. You feel dizzy. You have swelling in your ankles. You have trouble with your vision. Get help right away if: You get a very bad headache. You start to feel mixed up (confused). You feel weak or numb. You feel faint. You have very bad pain in your: Chest. Belly (abdomen). You vomit more than once. You have trouble breathing. These symptoms may be an emergency. Get help right away. Call 911. Do not wait to see if the symptoms will go away. Do not drive yourself to the hospital. Summary Hypertension is another name for high blood pressure. High blood pressure forces your heart to work harder to pump blood. For most people, a normal blood pressure is less than 120/80. Making healthy choices can help lower blood pressure. If your blood pressure does not get lower with healthy choices, you may need to take medicine. This information is not intended to replace advice given to you by your health care provider. Make sure you discuss any questions you have with your health care provider. Document Revised: 03/28/2021 Document Reviewed: 03/28/2021 Elsevier Patient Education  2024 [employer].

## 2024-05-24 NOTE — Progress Notes (Signed)
 Subjective:    Patient ID: Alan Wise, male    DOB: 12-Apr-1971, 53 y.o.   MRN: [id number]  HPI  Patient presents to clinic today for 15-month follow-up of chronic conditions.  HLD with aortic atherosclerosis status post stroke: His last LDL was 129, triglycerides 110, 11/2023.  He has persistent left sided weakness. He denies myalgias on atorvastatin . He is taking apixaban  as prescribed.  He tries to consume low-fat diet.  DM2: His last A1c was 5.7 %, 12/2023.  He is not taking any oral diabetic medication at this time but has been on glipizide  and januvia  in the past.  He does not check his sugars.  He checks his feet routinely.  His last eye exam was 12/2022.  Flu never.  Pneumovax never.  COVID Janssen x 1.  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 He is having difficulty with expressive aphasia.  He is taking lamotrigine  and quetiapine  as prescribed but is weaning off the quetiapine  due to daytime somnolence.  He follows with neurology and oncology.  HTN: His BP today is 128/84.  He is taking amlodipine  and propranolol  as prescribed. They are considering coming off the amlodipine . ECG from 11/2023 reviewed.  Review of Systems     Past Medical History:  Diagnosis Date   Allergy    Complication of anesthesia    pt reports he is starting to have more difficulty arousing after surgery   Diabetes mellitus (HCC)    GERD (gastroesophageal reflux disease)    Headache    History of kidney stones    Hyperlipidemia    Renal disorder    kidney stones    Current Outpatient Medications  Medication Sig Dispense Refill   acetaminophen  (TYLENOL ) 325 MG tablet Take 1-2 tablets (325-650 mg total) by mouth every 4 (four) hours as needed for mild pain (pain score 1-3).     amLODipine  (NORVASC ) 5 MG tablet TAKE 1 TABLET (5 MG TOTAL) BY MOUTH DAILY. 30 tablet 2   apixaban  (ELIQUIS ) 5 MG TABS tablet Take 1 tablet (5 mg total)  by mouth 2 (two) times daily. 60 tablet 3   atorvastatin  (LIPITOR) 40 MG tablet Take 1 tablet (40 mg total) by mouth daily with supper. 30 tablet 3   feeding supplement (ENSURE PLUS HIGH PROTEIN) LIQD Take 237 mLs by mouth 3 (three) times daily between meals. (Patient not taking: Reported on 04/22/2024)     lamoTRIgine  (LAMICTAL ) 150 MG tablet TAKE 1 TABLET BY MOUTH TWICE A DAY 60 tablet 2   Multiple Vitamin (MULTIVITAMIN WITH MINERALS) TABS tablet Take 1 tablet by mouth daily. (Patient not taking: Reported on 04/22/2024) 30 tablet 0   propranolol  (INDERAL ) 20 MG tablet TAKE 1 TABLET BY MOUTH TWICE A DAY 60 tablet 1   QUEtiapine  (SEROQUEL ) 25 MG tablet Take 1 tablet (25 mg total) by mouth 2 (two) times daily. 60 tablet 2   senna-docusate (SENOKOT-S) 8.6-50 MG tablet Take 2 tablets by mouth daily with supper. (Patient not taking: Reported on 04/22/2024) 60 tablet 0   No current facility-administered medications for this visit.    Allergies  Allergen Reactions   Contrast Media [Iodinated Contrast Media] Swelling   Keppra  [Levetiracetam ]     Agitated and mean    Family History  Problem Relation Age of Onset   Hyperlipidemia Father    Hypertension Father    Diabetes Father    Benign prostatic hyperplasia Father    Stroke Maternal Grandmother  Diabetes Maternal Grandmother    Stroke Paternal Grandmother    Hypertension Paternal Grandmother    Kidney disease Neg Hx    Prostate cancer Neg Hx     Social History   Socioeconomic History   Marital status: Married    Spouse name: Not on file   Number of children: Not on file   Years of education: Not on file   Highest education level: Not on file  Occupational History   Not on file  Tobacco Use   Smoking status: Never   Smokeless tobacco: Never  Vaping Use   Vaping status: Never Used  Substance and Sexual Activity   Alcohol use: Not Currently    Comment: rare   Drug use: No   Sexual activity: Not Currently  Other Topics  Concern   Not on file  Social History Narrative   Not on file   Social Drivers of Health   Financial Resource Strain: Low Risk  (12/05/2022)   Received from [facility]   Overall Financial Resource Strain (CARDIA)    Difficulty of Paying Living Expenses: Not hard at all  Food Insecurity: No Food Insecurity (03/11/2024)   Hunger Vital Sign    Worried About Running Out of Food in the Last Year: Never true    Ran Out of Food in the Last Year: Never true  Transportation Needs: No Transportation Needs (03/11/2024)   PRAPARE - Administrator, Civil Service (Medical): No    Lack of Transportation (Non-Medical): No  Recent Concern: Transportation Needs - Unmet Transportation Needs (02/19/2024)   PRAPARE - Transportation    Lack of Transportation (Medical): Yes    Lack of Transportation (Non-Medical): Yes  Physical Activity: Not on file  Stress: Stress Concern Present (02/19/2024)   [employer] of Occupational Health - Occupational Stress Questionnaire    Feeling of Stress: To some extent  Social Connections: Moderately Isolated (12/09/2023)   Social Connection and Isolation Panel    Frequency of Communication with Friends and Family: More than three times a week    Frequency of Social Gatherings with Friends and Family: More than three times a week    Attends Religious Services: Never    Database Administrator or Organizations: No    Attends Banker Meetings: Never    Marital Status: Married  Catering Manager Violence: Not At Risk (03/11/2024)   Humiliation, Afraid, Rape, and Kick questionnaire    Fear of Current or Ex-Partner: No    Emotionally Abused: No    Physically Abused: No    Sexually Abused: No     Constitutional: Denies fever, malaise, fatigue, headache or abrupt weight changes.  HEENT: Denies eye pain, eye redness, ear pain, ringing in the ears, wax buildup, runny nose, nasal congestion, bloody nose, or sore  throat. Respiratory: Denies difficulty breathing, shortness of breath, cough or sputum production.   Cardiovascular: Denies chest pain, chest tightness, palpitations or swelling in the hands or feet.  Gastrointestinal: Denies abdominal pain, bloating, constipation, diarrhea or blood in the stool.  GU: Denies urgency, frequency, pain with urination, burning sensation, blood in urine, odor or discharge. Musculoskeletal: Denies decrease in range of motion, difficulty with gait, muscle pain or joint pain and swelling.  Skin: Denies redness, rashes, lesions or ulcercations.  Neurological: Patient reports difficulty with speech.  Denies dizziness, difficulty with memory, difficulty with speech or problems with balance and coordination.  Psych: Denies anxiety, depression, SI/HI.  No other specific complaints in  a complete review of systems (except as listed in HPI above).  Objective:   Physical Exam  BP 128/84 (BP Location: Right Arm, Patient Position: Sitting, Cuff Size: Normal)   Ht 5' 8.5 (1.74 m)   Wt 149 lb 3.2 oz (67.7 kg)   BMI 22.36 kg/m   Wt Readings from Last 3 Encounters:  04/22/24 149 lb (67.6 kg)  04/04/24 145 lb (65.8 kg)  02/05/24 133 lb 6.4 oz (60.5 kg)    General: Appears his stated age, well developed, well nourished in NAD. Skin: Warm, dry and intact.  No ulcerations noted. HEENT: Head: normal shape and size; Eyes: sclera white, no icterus, conjunctiva pink, PERRLA and EOMs intact;  Cardiovascular: Normal rate and rhythm. S1,S2 noted.  No murmur, rubs or gallops noted. No JVD or BLE edema. No carotid bruits noted. Pulmonary/Chest: Normal effort and positive vesicular breath sounds. No respiratory distress. No wheezes, rales or ronchi noted.  Musculoskeletal: Strength 4/5 LUE/LLE, 5/5 RUE/RLE.  Handgrips unequal, R >L.  No difficulty with gait.  Neurological: Alert and oriented.  Expressive aphasia noted.  Coordination normal.  Psychiatric: Mood and affect normal.  Behavior is normal. Judgment and thought content normal.     BMET    Component Value Date/Time   NA 138 01/11/2024 0522   NA 138 05/04/2012 1815   K 4.0 01/11/2024 0522   K 3.5 05/04/2012 1815   CL 104 01/11/2024 0522   CL 99 05/04/2012 1815   CO2 24 01/11/2024 0522   CO2 29 05/04/2012 1815   GLUCOSE 167 (H) 01/11/2024 0522   GLUCOSE 69 05/04/2012 1815   BUN 14 01/11/2024 0522   BUN 12 05/04/2012 1815   CREATININE 0.76 01/11/2024 0522   CREATININE 0.95 08/19/2023 0818   CALCIUM  9.3 01/11/2024 0522   CALCIUM  9.2 05/04/2012 1815   GFRNONAA >60 01/11/2024 0522   GFRNONAA >60 05/22/2023 0936   GFRNONAA 106 12/05/2020 1014   GFRAA 123 12/05/2020 1014    Lipid Panel     Component Value Date/Time   CHOL 178 12/19/2023 0523   TRIG 110 12/19/2023 0523   HDL 27 (L) 12/19/2023 0523   CHOLHDL 6.6 12/19/2023 0523   VLDL 22 12/19/2023 0523   LDLCALC 129 (H) 12/19/2023 0523   LDLCALC 161 (H) 08/19/2023 0818    CBC    Component Value Date/Time   WBC 9.9 01/11/2024 0522   RBC 4.70 01/11/2024 0522   HGB 13.8 01/11/2024 0522   HGB 14.9 10/05/2023 0946   HGB 16.4 05/04/2012 1815   HCT 39.9 01/11/2024 0522   HCT 48.4 05/04/2012 1815   PLT 211 01/11/2024 0522   PLT 207 10/05/2023 0946   PLT 261 05/04/2012 1815   MCV 84.9 01/11/2024 0522   MCV 86 05/04/2012 1815   MCH 29.4 01/11/2024 0522   MCHC 34.6 01/11/2024 0522   RDW 12.6 01/11/2024 0522   RDW 12.7 05/04/2012 1815   LYMPHSABS 1.7 12/25/2023 0441   MONOABS 0.6 12/25/2023 0441   EOSABS 0.4 12/25/2023 0441   BASOSABS 0.1 12/25/2023 0441    Hgb A1C Lab Results  Component Value Date   HGBA1C 5.7 (H) 12/23/2023           Assessment & Plan:     RTC in 6 months for your annual exam Angeline Laura, NP

## 2024-05-24 NOTE — Assessment & Plan Note (Signed)
 C-Met and lipid profile today Continue atorvastatin  40 mg daily Encouraged him to consume a low-fat diet

## 2024-05-24 NOTE — Assessment & Plan Note (Signed)
 C-Met and lipid profile today Discussed the importance of good cholesterol, diabetes and blood pressure control Continue amlodipine  5 mg daily, apixaban  5 mg twice daily, atorvastatin  40 mg daily and propranolol  20 mg twice daily He will continue to follow with neurology

## 2024-05-25 ENCOUNTER — Ambulatory Visit: Payer: Self-pay | Admitting: Internal Medicine

## 2024-05-25 ENCOUNTER — Other Ambulatory Visit: Payer: Self-pay

## 2024-06-02 ENCOUNTER — Other Ambulatory Visit: Payer: Self-pay | Admitting: Internal Medicine

## 2024-06-29 ENCOUNTER — Other Ambulatory Visit: Payer: Self-pay | Admitting: Internal Medicine

## 2024-06-30 ENCOUNTER — Encounter: Payer: Self-pay | Admitting: Internal Medicine

## 2024-07-24 ENCOUNTER — Other Ambulatory Visit: Payer: Self-pay | Admitting: Internal Medicine

## 2024-07-25 ENCOUNTER — Encounter: Payer: Self-pay | Admitting: Internal Medicine

## 2024-08-12 ENCOUNTER — Ambulatory Visit

## 2024-08-19 ENCOUNTER — Inpatient Hospital Stay: Admitting: Internal Medicine

## 2024-10-03 ENCOUNTER — Ambulatory Visit: Admitting: Physical Medicine and Rehabilitation

## 2024-11-21 ENCOUNTER — Encounter: Admitting: Internal Medicine
